# Patient Record
Sex: Female | Born: 1935 | Race: White | Hispanic: No | Smoking: Never smoker
Health system: Southern US, Community
[De-identification: ages and names within clinical notes are randomized; demographics above are authoritative.]

## PROBLEM LIST (undated history)

## (undated) DIAGNOSIS — T7840XA Allergy, unspecified, initial encounter: Secondary | ICD-10-CM

## (undated) DIAGNOSIS — I10 Essential (primary) hypertension: Secondary | ICD-10-CM

## (undated) DIAGNOSIS — Z9289 Personal history of other medical treatment: Secondary | ICD-10-CM

## (undated) DIAGNOSIS — K219 Gastro-esophageal reflux disease without esophagitis: Secondary | ICD-10-CM

## (undated) DIAGNOSIS — I480 Paroxysmal atrial fibrillation: Secondary | ICD-10-CM

## (undated) DIAGNOSIS — R55 Syncope and collapse: Secondary | ICD-10-CM

## (undated) DIAGNOSIS — R7301 Impaired fasting glucose: Secondary | ICD-10-CM

## (undated) DIAGNOSIS — H409 Unspecified glaucoma: Secondary | ICD-10-CM

## (undated) DIAGNOSIS — H269 Unspecified cataract: Secondary | ICD-10-CM

## (undated) DIAGNOSIS — C9110 Chronic lymphocytic leukemia of B-cell type not having achieved remission: Secondary | ICD-10-CM

## (undated) DIAGNOSIS — R001 Bradycardia, unspecified: Secondary | ICD-10-CM

## (undated) DIAGNOSIS — M810 Age-related osteoporosis without current pathological fracture: Secondary | ICD-10-CM

## (undated) DIAGNOSIS — I429 Cardiomyopathy, unspecified: Secondary | ICD-10-CM

## (undated) DIAGNOSIS — L900 Lichen sclerosus et atrophicus: Secondary | ICD-10-CM

## (undated) DIAGNOSIS — Z90710 Acquired absence of both cervix and uterus: Secondary | ICD-10-CM

## (undated) DIAGNOSIS — D649 Anemia, unspecified: Secondary | ICD-10-CM

## (undated) DIAGNOSIS — I5189 Other ill-defined heart diseases: Secondary | ICD-10-CM

## (undated) DIAGNOSIS — E785 Hyperlipidemia, unspecified: Secondary | ICD-10-CM

## (undated) DIAGNOSIS — E871 Hypo-osmolality and hyponatremia: Secondary | ICD-10-CM

## (undated) DIAGNOSIS — E039 Hypothyroidism, unspecified: Secondary | ICD-10-CM

## (undated) DIAGNOSIS — I779 Disorder of arteries and arterioles, unspecified: Secondary | ICD-10-CM

## (undated) HISTORY — DX: Cardiomyopathy, unspecified: I42.9

## (undated) HISTORY — DX: Hyperlipidemia, unspecified: E78.5

## (undated) HISTORY — DX: Lichen sclerosus et atrophicus: L90.0

## (undated) HISTORY — DX: Other ill-defined heart diseases: I51.89

## (undated) HISTORY — PX: APPENDECTOMY: SHX54

## (undated) HISTORY — DX: Unspecified cataract: H26.9

## (undated) HISTORY — DX: Essential (primary) hypertension: I10

## (undated) HISTORY — DX: Syncope and collapse: R55

## (undated) HISTORY — DX: Allergy, unspecified, initial encounter: T78.40XA

## (undated) HISTORY — DX: Anemia, unspecified: D64.9

## (undated) HISTORY — DX: Paroxysmal atrial fibrillation: I48.0

## (undated) HISTORY — DX: Gastro-esophageal reflux disease without esophagitis: K21.9

## (undated) HISTORY — DX: Disorder of arteries and arterioles, unspecified: I77.9

## (undated) HISTORY — DX: Impaired fasting glucose: R73.01

## (undated) HISTORY — DX: Personal history of other medical treatment: Z92.89

## (undated) HISTORY — PX: MULTIPLE TOOTH EXTRACTIONS: SHX2053

## (undated) HISTORY — DX: Unspecified glaucoma: H40.9

## (undated) HISTORY — DX: Hypothyroidism, unspecified: E03.9

## (undated) HISTORY — DX: Bradycardia, unspecified: R00.1

## (undated) HISTORY — PX: EYE SURGERY: SHX253

## (undated) HISTORY — DX: Age-related osteoporosis without current pathological fracture: M81.0

## (undated) HISTORY — PX: TOTAL ABDOMINAL HYSTERECTOMY: SHX209

## (undated) HISTORY — DX: Acquired absence of both cervix and uterus: Z90.710

---

## 2007-03-06 ENCOUNTER — Ambulatory Visit: Payer: Self-pay

## 2010-06-28 ENCOUNTER — Observation Stay: Payer: Self-pay | Admitting: Internal Medicine

## 2011-06-26 ENCOUNTER — Ambulatory Visit: Payer: Self-pay

## 2011-07-12 DIAGNOSIS — Z1231 Encounter for screening mammogram for malignant neoplasm of breast: Secondary | ICD-10-CM | POA: Diagnosis not present

## 2011-08-16 DIAGNOSIS — E785 Hyperlipidemia, unspecified: Secondary | ICD-10-CM | POA: Diagnosis not present

## 2011-08-16 DIAGNOSIS — I6529 Occlusion and stenosis of unspecified carotid artery: Secondary | ICD-10-CM | POA: Diagnosis not present

## 2011-08-16 DIAGNOSIS — I10 Essential (primary) hypertension: Secondary | ICD-10-CM | POA: Diagnosis not present

## 2011-08-16 DIAGNOSIS — E119 Type 2 diabetes mellitus without complications: Secondary | ICD-10-CM | POA: Diagnosis not present

## 2011-08-16 DIAGNOSIS — Z79899 Other long term (current) drug therapy: Secondary | ICD-10-CM | POA: Diagnosis not present

## 2011-08-16 DIAGNOSIS — H409 Unspecified glaucoma: Secondary | ICD-10-CM | POA: Diagnosis not present

## 2011-08-16 DIAGNOSIS — I658 Occlusion and stenosis of other precerebral arteries: Secondary | ICD-10-CM | POA: Diagnosis not present

## 2011-08-16 DIAGNOSIS — E039 Hypothyroidism, unspecified: Secondary | ICD-10-CM | POA: Diagnosis not present

## 2011-10-11 DIAGNOSIS — H4011X0 Primary open-angle glaucoma, stage unspecified: Secondary | ICD-10-CM | POA: Diagnosis not present

## 2011-11-27 DIAGNOSIS — I10 Essential (primary) hypertension: Secondary | ICD-10-CM | POA: Diagnosis not present

## 2011-11-27 DIAGNOSIS — E039 Hypothyroidism, unspecified: Secondary | ICD-10-CM | POA: Diagnosis not present

## 2011-11-27 DIAGNOSIS — M545 Low back pain: Secondary | ICD-10-CM | POA: Diagnosis not present

## 2011-11-27 DIAGNOSIS — E119 Type 2 diabetes mellitus without complications: Secondary | ICD-10-CM | POA: Diagnosis not present

## 2012-03-25 DIAGNOSIS — I10 Essential (primary) hypertension: Secondary | ICD-10-CM | POA: Diagnosis not present

## 2012-03-25 DIAGNOSIS — J019 Acute sinusitis, unspecified: Secondary | ICD-10-CM | POA: Diagnosis not present

## 2012-04-11 DIAGNOSIS — Z23 Encounter for immunization: Secondary | ICD-10-CM | POA: Diagnosis not present

## 2012-04-25 DIAGNOSIS — H4011X0 Primary open-angle glaucoma, stage unspecified: Secondary | ICD-10-CM | POA: Diagnosis not present

## 2012-06-03 DIAGNOSIS — I10 Essential (primary) hypertension: Secondary | ICD-10-CM | POA: Diagnosis not present

## 2012-06-03 DIAGNOSIS — E039 Hypothyroidism, unspecified: Secondary | ICD-10-CM | POA: Diagnosis not present

## 2012-06-03 DIAGNOSIS — Z0000 Encounter for general adult medical examination without abnormal findings: Secondary | ICD-10-CM | POA: Diagnosis not present

## 2012-06-03 DIAGNOSIS — E119 Type 2 diabetes mellitus without complications: Secondary | ICD-10-CM | POA: Diagnosis not present

## 2012-09-25 DIAGNOSIS — H4011X0 Primary open-angle glaucoma, stage unspecified: Secondary | ICD-10-CM | POA: Diagnosis not present

## 2012-12-11 DIAGNOSIS — E785 Hyperlipidemia, unspecified: Secondary | ICD-10-CM | POA: Diagnosis not present

## 2012-12-11 DIAGNOSIS — E039 Hypothyroidism, unspecified: Secondary | ICD-10-CM | POA: Diagnosis not present

## 2012-12-11 DIAGNOSIS — E119 Type 2 diabetes mellitus without complications: Secondary | ICD-10-CM | POA: Diagnosis not present

## 2013-04-03 DIAGNOSIS — H4010X0 Unspecified open-angle glaucoma, stage unspecified: Secondary | ICD-10-CM | POA: Diagnosis not present

## 2013-06-11 DIAGNOSIS — L280 Lichen simplex chronicus: Secondary | ICD-10-CM | POA: Diagnosis not present

## 2013-06-11 DIAGNOSIS — I10 Essential (primary) hypertension: Secondary | ICD-10-CM | POA: Diagnosis not present

## 2013-06-11 DIAGNOSIS — E7800 Pure hypercholesterolemia, unspecified: Secondary | ICD-10-CM | POA: Diagnosis not present

## 2013-06-11 DIAGNOSIS — R7301 Impaired fasting glucose: Secondary | ICD-10-CM | POA: Diagnosis not present

## 2013-06-11 DIAGNOSIS — Z1331 Encounter for screening for depression: Secondary | ICD-10-CM | POA: Diagnosis not present

## 2013-06-11 DIAGNOSIS — E039 Hypothyroidism, unspecified: Secondary | ICD-10-CM | POA: Diagnosis not present

## 2013-06-11 DIAGNOSIS — Z0000 Encounter for general adult medical examination without abnormal findings: Secondary | ICD-10-CM | POA: Diagnosis not present

## 2013-06-11 DIAGNOSIS — E785 Hyperlipidemia, unspecified: Secondary | ICD-10-CM | POA: Diagnosis not present

## 2013-06-11 DIAGNOSIS — E119 Type 2 diabetes mellitus without complications: Secondary | ICD-10-CM | POA: Diagnosis not present

## 2013-10-01 DIAGNOSIS — H4011X0 Primary open-angle glaucoma, stage unspecified: Secondary | ICD-10-CM | POA: Diagnosis not present

## 2013-12-10 DIAGNOSIS — E785 Hyperlipidemia, unspecified: Secondary | ICD-10-CM | POA: Diagnosis not present

## 2013-12-10 DIAGNOSIS — E119 Type 2 diabetes mellitus without complications: Secondary | ICD-10-CM | POA: Diagnosis not present

## 2013-12-10 DIAGNOSIS — I10 Essential (primary) hypertension: Secondary | ICD-10-CM | POA: Diagnosis not present

## 2014-03-10 DIAGNOSIS — Z23 Encounter for immunization: Secondary | ICD-10-CM | POA: Diagnosis not present

## 2014-04-02 DIAGNOSIS — H40013 Open angle with borderline findings, low risk, bilateral: Secondary | ICD-10-CM | POA: Diagnosis not present

## 2014-06-16 DIAGNOSIS — Z0000 Encounter for general adult medical examination without abnormal findings: Secondary | ICD-10-CM | POA: Diagnosis not present

## 2014-06-16 DIAGNOSIS — R350 Frequency of micturition: Secondary | ICD-10-CM | POA: Diagnosis not present

## 2014-06-16 DIAGNOSIS — R8299 Other abnormal findings in urine: Secondary | ICD-10-CM | POA: Diagnosis not present

## 2014-06-16 DIAGNOSIS — I10 Essential (primary) hypertension: Secondary | ICD-10-CM | POA: Diagnosis not present

## 2014-06-16 DIAGNOSIS — G0439 Other acute necrotizing hemorrhagic encephalopathy: Secondary | ICD-10-CM | POA: Diagnosis not present

## 2014-06-16 DIAGNOSIS — R7301 Impaired fasting glucose: Secondary | ICD-10-CM | POA: Diagnosis not present

## 2014-06-30 DIAGNOSIS — E2839 Other primary ovarian failure: Secondary | ICD-10-CM | POA: Diagnosis not present

## 2014-06-30 DIAGNOSIS — M818 Other osteoporosis without current pathological fracture: Secondary | ICD-10-CM | POA: Diagnosis not present

## 2014-10-14 DIAGNOSIS — H40013 Open angle with borderline findings, low risk, bilateral: Secondary | ICD-10-CM | POA: Diagnosis not present

## 2014-12-07 ENCOUNTER — Telehealth: Payer: Self-pay

## 2014-12-07 DIAGNOSIS — R05 Cough: Secondary | ICD-10-CM | POA: Insufficient documentation

## 2014-12-07 DIAGNOSIS — R059 Cough, unspecified: Secondary | ICD-10-CM | POA: Insufficient documentation

## 2014-12-07 DIAGNOSIS — J069 Acute upper respiratory infection, unspecified: Secondary | ICD-10-CM | POA: Diagnosis not present

## 2014-12-07 DIAGNOSIS — I700 Atherosclerosis of aorta: Secondary | ICD-10-CM | POA: Diagnosis not present

## 2014-12-07 NOTE — Telephone Encounter (Signed)
Since pt has a fever, she needs to be seen.  She can go to urgent care

## 2014-12-07 NOTE — Telephone Encounter (Signed)
Sydney Mcdaniel has crud, cough fever. They do not want her husband to get it since he's so sick. Daughter [name] wants to know if you can call in something for her or work her in somewhere? The first place I could squeeze her in would be Thursday morning.

## 2014-12-07 NOTE — Telephone Encounter (Signed)
Pt's daughter notified.

## 2014-12-07 NOTE — Telephone Encounter (Signed)
She is not my patient; please forward to appropriate provider

## 2014-12-22 ENCOUNTER — Encounter: Payer: Self-pay | Admitting: Unknown Physician Specialty

## 2014-12-22 ENCOUNTER — Ambulatory Visit (INDEPENDENT_AMBULATORY_CARE_PROVIDER_SITE_OTHER): Payer: Medicare Other | Admitting: Unknown Physician Specialty

## 2014-12-22 VITALS — BP 131/64 | HR 62 | Temp 98.6°F | Ht 61.8 in | Wt 144.0 lb

## 2014-12-22 DIAGNOSIS — E7800 Pure hypercholesterolemia, unspecified: Secondary | ICD-10-CM

## 2014-12-22 DIAGNOSIS — F4321 Adjustment disorder with depressed mood: Secondary | ICD-10-CM

## 2014-12-22 DIAGNOSIS — E039 Hypothyroidism, unspecified: Secondary | ICD-10-CM

## 2014-12-22 DIAGNOSIS — I10 Essential (primary) hypertension: Secondary | ICD-10-CM | POA: Insufficient documentation

## 2014-12-22 DIAGNOSIS — E785 Hyperlipidemia, unspecified: Secondary | ICD-10-CM | POA: Insufficient documentation

## 2014-12-22 MED ORDER — AMLODIPINE BESYLATE 5 MG PO TABS: 5.0000 mg | ORAL_TABLET | Freq: Every day | ORAL | Status: DC

## 2014-12-22 MED ORDER — LEVOTHYROXINE SODIUM 88 MCG PO TABS: 88.0000 ug | ORAL_TABLET | Freq: Every day | ORAL | Status: DC

## 2014-12-22 MED ORDER — LISINOPRIL 20 MG PO TABS: 20.0000 mg | ORAL_TABLET | Freq: Every day | ORAL | Status: DC

## 2014-12-22 MED ORDER — ATORVASTATIN CALCIUM 40 MG PO TABS: 40.0000 mg | ORAL_TABLET | Freq: Every day | ORAL | Status: DC

## 2014-12-22 MED ORDER — IPRATROPIUM BROMIDE 0.03 % NA SOLN: 2.0000 | Freq: Three times a day (TID) | NASAL | Status: DC

## 2014-12-22 NOTE — Progress Notes (Signed)
BP 131/64 mmHg  Pulse 62  Temp(Src) 98.6 F (37 C)  Ht 5' 1.8" (1.57 m)  Wt 144 lb (65.318 kg)  BMI 26.50 kg/m2  SpO2 96%  LMP  (LMP Unknown)   Subjective:    Patient ID: Sydney Mcdaniel, female    DOB: 09-Feb-1925, 81 y.o.   MRN: [id number]  HPI: Sydney Mcdaniel is a 81 y.o. female  Chief Complaint  Patient presents with  . Hyperlipidemia  . Hypertension  . Medication Refill    pt needs lisinopril refilled    Relevant past medical, surgical, family and social history reviewed and updated as indicated. Interim medical history since our last visit reviewed. Allergies and medications reviewed and updated.  Hyperlipidemia This is a chronic problem. The problem is controlled. Recent lipid tests were reviewed and are normal. Pertinent negatives include no chest pain or shortness of breath. There are no compliance problems.   Hypertension This is a chronic problem. The problem is controlled. Pertinent negatives include no anxiety, chest pain, malaise/fatigue or shortness of breath. There are no associated agents to hypertension. There are no compliance problems.      Review of Systems  Constitutional: Negative for malaise/fatigue.  Respiratory: Negative for shortness of breath.        Cough.  Treated at Urgent Care with a Z pack about 8 days ago.  Better but still has a cough    Cardiovascular: Negative for chest pain.  Psychiatric/Behavioral:       Depression as just lost husband to brain cancer    Per HPI unless specifically indicated above     Objective:    BP 131/64 mmHg  Pulse 62  Temp(Src) 98.6 F (37 C)  Ht 5' 1.8" (1.57 m)  Wt 144 lb (65.318 kg)  BMI 26.50 kg/m2  SpO2 96%  LMP  (LMP Unknown)  Wt Readings from Last 3 Encounters:  12/22/14 144 lb (65.318 kg)  06/16/14 144 lb (65.318 kg)    Physical Exam  Constitutional: She is oriented to person, place, and time. She appears well-developed and well-nourished. No distress.  HENT:  Head: Normocephalic  and atraumatic.  Eyes: Conjunctivae and lids are normal. Right eye exhibits no discharge. Left eye exhibits no discharge. No scleral icterus.  Cardiovascular: Normal rate and regular rhythm.   Pulmonary/Chest: Effort normal and breath sounds normal. No respiratory distress.  Abdominal: Normal appearance and bowel sounds are normal. She exhibits no distension. There is no splenomegaly or hepatomegaly. There is no tenderness.  Musculoskeletal: Normal range of motion.  Neurological: She is alert and oriented to person, place, and time.  Skin: Skin is intact. No rash noted. No pallor.  Psychiatric: She has a normal mood and affect. Her behavior is normal. Judgment and thought content normal.    No results found for this or any previous visit.    Assessment & Plan:   Problem List Items Addressed This Visit      Cardiovascular and Mediastinum   Essential hypertension, benign - Primary   Relevant Medications   amLODipine (NORVASC) 5 MG tablet   atorvastatin (LIPITOR) 40 MG tablet   lisinopril (PRINIVIL,ZESTRIL) 20 MG tablet     Endocrine   Hypothyroidism   Relevant Medications   levothyroxine (SYNTHROID, LEVOTHROID) 88 MCG tablet     Other   Hypercholesteremia   Relevant Medications   amLODipine (NORVASC) 5 MG tablet   atorvastatin (LIPITOR) 40 MG tablet   lisinopril (PRINIVIL,ZESTRIL) 20 MG tablet    Other Visit  Diagnoses    Grief reaction        Daughter and she feels they don't need additional help.  She has lots of family support        Follow up plan: Return in about 6 months (around 06/23/2015) for PE.  No labs today.  Needs CMP, hgb A1C, TSH and Lipid panel next visit Filled out a handicapped form.

## 2015-04-13 ENCOUNTER — Ambulatory Visit: Payer: Medicare Other

## 2015-04-13 DIAGNOSIS — H40019 Open angle with borderline findings, low risk, unspecified eye: Secondary | ICD-10-CM | POA: Diagnosis not present

## 2015-04-13 DIAGNOSIS — Z23 Encounter for immunization: Secondary | ICD-10-CM

## 2015-06-22 ENCOUNTER — Encounter: Payer: Self-pay | Admitting: Family Medicine

## 2015-06-22 ENCOUNTER — Ambulatory Visit (INDEPENDENT_AMBULATORY_CARE_PROVIDER_SITE_OTHER): Payer: Medicare Other | Admitting: Family Medicine

## 2015-06-22 ENCOUNTER — Telehealth: Payer: Self-pay | Admitting: Family Medicine

## 2015-06-22 VITALS — BP 144/62 | HR 64 | Temp 98.7°F | Ht 61.25 in | Wt 138.0 lb

## 2015-06-22 DIAGNOSIS — E7800 Pure hypercholesterolemia, unspecified: Secondary | ICD-10-CM | POA: Diagnosis not present

## 2015-06-22 DIAGNOSIS — R7301 Impaired fasting glucose: Secondary | ICD-10-CM

## 2015-06-22 DIAGNOSIS — I10 Essential (primary) hypertension: Secondary | ICD-10-CM | POA: Diagnosis not present

## 2015-06-22 DIAGNOSIS — Z862 Personal history of diseases of the blood and blood-forming organs and certain disorders involving the immune mechanism: Secondary | ICD-10-CM | POA: Diagnosis not present

## 2015-06-22 DIAGNOSIS — I6529 Occlusion and stenosis of unspecified carotid artery: Secondary | ICD-10-CM | POA: Insufficient documentation

## 2015-06-22 DIAGNOSIS — Z0000 Encounter for general adult medical examination without abnormal findings: Secondary | ICD-10-CM | POA: Diagnosis not present

## 2015-06-22 DIAGNOSIS — E039 Hypothyroidism, unspecified: Secondary | ICD-10-CM

## 2015-06-22 DIAGNOSIS — Z5181 Encounter for therapeutic drug level monitoring: Secondary | ICD-10-CM

## 2015-06-22 DIAGNOSIS — R351 Nocturia: Secondary | ICD-10-CM | POA: Diagnosis not present

## 2015-06-22 DIAGNOSIS — I6523 Occlusion and stenosis of bilateral carotid arteries: Secondary | ICD-10-CM | POA: Diagnosis not present

## 2015-06-22 LAB — MICROSCOPIC EXAMINATION

## 2015-06-22 LAB — MICROALBUMIN, URINE WAIVED
CREATININE, URINE WAIVED: 50 mg/dL (ref 10–300)
MICROALB, UR WAIVED: 30 mg/L — AB (ref 0–19)

## 2015-06-22 MED ORDER — DOXYCYCLINE HYCLATE 100 MG PO TABS: 100.0000 mg | ORAL_TABLET | Freq: Two times a day (BID) | ORAL | Status: DC

## 2015-06-22 MED ORDER — ATORVASTATIN CALCIUM 20 MG PO TABS: 20.0000 mg | ORAL_TABLET | Freq: Every day | ORAL | Status: DC

## 2015-06-22 MED ORDER — AMLODIPINE BESYLATE 5 MG PO TABS: 5.0000 mg | ORAL_TABLET | Freq: Every day | ORAL | Status: DC

## 2015-06-22 MED ORDER — LEVOTHYROXINE SODIUM 88 MCG PO TABS: 88.0000 ug | ORAL_TABLET | Freq: Every day | ORAL | Status: DC

## 2015-06-22 MED ORDER — LISINOPRIL 20 MG PO TABS: 20.0000 mg | ORAL_TABLET | Freq: Every day | ORAL | Status: DC

## 2015-06-22 MED ORDER — IPRATROPIUM BROMIDE 0.03 % NA SOLN: 2.0000 | Freq: Three times a day (TID) | NASAL | Status: DC

## 2015-06-22 NOTE — Patient Instructions (Addendum)
Your goal blood pressure is less than 150 mmHg on top. Try to follow the DASH guidelines (DASH stands for Dietary Approaches to Stop Hypertension) Try to limit the sodium in your diet.  Ideally, consume less than 1.5 grams (less than 1,500mg ) per day. Do not add salt when cooking or at the table.  Check the sodium amount on labels when shopping, and choose items lower in sodium when given a choice. Avoid or limit foods that already contain a lot of sodium. Eat a diet rich in fruits and vegetables and whole grains. Monitor your blood pressure at home daily for the next two weeks, and let me know if top number not staying under 150 Try to practice good fall precautions (don't get up on chairs, ladders, hold hand railings if going up or down stairs, etc.)  Okay to stop vitamin E Stop the iron for now  Return in 6 months for cholesterol and prediabetes Return in 12+ months for next Medicare visit

## 2015-06-22 NOTE — Progress Notes (Signed)
Patient: Sydney Mcdaniel, Female    DOB: 04/14/1925, 81 y.o.   MRN: [id number]  Today's Provider: Enid Derry, MD   Chief Complaint  Patient presents with  . Annual Exam    Wellness exam   Subjective:   Sydney Mcdaniel is a 81 y.o. female who presents today for her Subsequent Annual Wellness Visit.  Caregiver input:  Daughter is here  USPSTF grade A and B recommendations Alcohol: nondrinker Depression:  Depression screen [facility] 2/9 06/22/2015 12/22/2014  Decreased Interest 0 3  Down, Depressed, Hopeless 1 3  PHQ - 2 Score 1 6  Altered sleeping - 3  Tired, decreased energy - 2  Change in appetite - 1  Feeling bad or failure about yourself  - 0  Trouble concentrating - 3  Moving slowly or fidgety/restless - 0  Suicidal thoughts - 0  PHQ-9 Score - 15   Hypertension: uncontrolled today; no recent echo; no murmur to her knowledge Obesity: barely above normal BMI, for age, this is acceptable Tobacco use: nonsmoker HIV, hep B, hep C: politely declined STD testing and prevention (chl/gon/syphilis): politely declined Lipids: check today Glucose: check today Colorectal cancer: aged out Breast cancer: has not had one since 2013 Cervical cancer screening: n/a Lung cancer: n/a Osteoporosis: UTD Fall prevention/vitamin D:  AAA: n/a Aspirin: discussed no good study, but no bleeding problems Diet: better than typical American, not much cheese or red meat or eggs Exercise: active and walks, goes to the mailbox, gets out in the yard Skin cancer: no worrisome moles  Long time in between urination; gets up 1-3x a night; not much urine flow in the daytime; not much a water drinker though; used to get dehydrated Dr. Marijean Bravo put her back on the aspirin when they were examining her carotids; she isn't taking it every day; no problems with the aspirin, no bleeding  HPI  Review of Systems  Past Medical History  Diagnosis Date  . Glaucoma   . H/O: hysterectomy     Total  . Lichen  sclerosus   . Hypothyroidism   . Allergy   . Impaired fasting glucose   . Osteoporosis     Hips  . Hyperlipidemia   . Hypertension    Past Surgical History  Procedure Laterality Date  . Total abdominal hysterectomy    . Eye surgery      Glaucoma   Family History  Problem Relation Age of Onset  . Heart attack Mother   . Glaucoma Mother   . Heart attack Father   . Parkinson's disease Brother   . Heart attack Brother    Social History   Social History  . Marital Status: Married    Spouse Name: N/A  . Number of Children: N/A  . Years of Education: N/A   Occupational History  . Not on file.   Social History Main Topics  . Smoking status: Never Smoker   . Smokeless tobacco: Never Used  . Alcohol Use: No  . Drug Use: No  . Sexual Activity: No   Other Topics Concern  . Not on file   Social History Narrative   Outpatient Encounter Prescriptions as of 06/22/2015  Medication Sig Note  . amLODipine (NORVASC) 5 MG tablet Take 1 tablet (5 mg total) by mouth daily.   Marland Kitchen aspirin 81 MG tablet Take 81 mg by mouth daily.   . Calcium Carb-Cholecalciferol (CALCIUM 600 + D PO) Take by mouth daily.   Marland Kitchen ipratropium (ATROVENT) 0.03 % nasal spray  Place 2 sprays into both nostrils 3 (three) times daily.   Marland Kitchen latanoprost (XALATAN) 0.005 % ophthalmic solution  06/22/2015: Received from: External Pharmacy  . levothyroxine (SYNTHROID, LEVOTHROID) 88 MCG tablet Take 1 tablet (88 mcg total) by mouth daily before breakfast.   . lisinopril (PRINIVIL,ZESTRIL) 20 MG tablet Take 1 tablet (20 mg total) by mouth daily.   . Omega-3 Fatty Acids (FISH OIL) 1200 MG CAPS Take by mouth daily.   . timolol (TIMOPTIC) 0.5 % ophthalmic solution  06/22/2015: Received from: External Pharmacy  . [DISCONTINUED] amLODipine (NORVASC) 5 MG tablet Take 1 tablet (5 mg total) by mouth daily.   . [DISCONTINUED] atorvastatin (LIPITOR) 40 MG tablet Take 1 tablet (40 mg total) by mouth at bedtime.   . [DISCONTINUED]  ferrous sulfate 325 (65 FE) MG EC tablet Take 325 mg by mouth daily.   . [DISCONTINUED] ipratropium (ATROVENT) 0.03 % nasal spray Place 2 sprays into both nostrils 3 (three) times daily.   . [DISCONTINUED] levothyroxine (SYNTHROID, LEVOTHROID) 88 MCG tablet Take 1 tablet (88 mcg total) by mouth daily before breakfast.   . [DISCONTINUED] lisinopril (PRINIVIL,ZESTRIL) 20 MG tablet Take 1 tablet (20 mg total) by mouth daily.   . [DISCONTINUED] vitamin E 400 UNIT capsule Take 400 Units by mouth daily.   Marland Kitchen atorvastatin (LIPITOR) 20 MG tablet Take 1 tablet (20 mg total) by mouth at bedtime.   . [DISCONTINUED] atorvastatin (LIPITOR) 20 MG tablet Take 1 tablet (20 mg total) by mouth at bedtime.    No facility-administered encounter medications on file as of 06/22/2015.   Functional Ability / Safety Screening 1.  Was the timed Get Up and Go test longer than 30 seconds?  no 2.  Does the patient need help with the phone, transportation, shopping,      preparing meals, housework, laundry, medications, or managing money?  yes 3.  Does the patient's home have:  loose throw rugs in the hallway?   no      Grab bars in the bathroom? yes      Handrails on the stairs?   yes      Poor lighting?   no 4.  Has the patient noticed any hearing difficulties?   no, not much  Fall Risk Assessment See under rooming  Depression Screen See under rooming Depression screen [facility] 2/9 06/22/2015 12/22/2014  Decreased Interest 0 3  Down, Depressed, Hopeless 1 3  PHQ - 2 Score 1 6  Altered sleeping - 3  Tired, decreased energy - 2  Change in appetite - 1  Feeling bad or failure about yourself  - 0  Trouble concentrating - 3  Moving slowly or fidgety/restless - 0  Suicidal thoughts - 0  PHQ-9 Score - 15   Advanced Directives Does patient have a HCPOA?    yes If yes, name and contact information: [name], [phone] Does patient have a living will or MOST form?  no  Objective:   Vitals: BP 144/62 mmHg  Pulse 64   Temp(Src) 98.7 F (37.1 C)  Ht 5' 1.25" (1.556 m)  Wt 138 lb (62.596 kg)  BMI 25.85 kg/m2  SpO2 99%  LMP  (LMP Unknown) Body mass index is 25.85 kg/(m^2).  Visual Acuity Screening   Right eye Left eye Both eyes  Without correction:     With correction: 20/30 20/40    Physical Exam  Constitutional: She appears well-developed and well-nourished. No distress.  Cardiovascular: Normal rate and regular rhythm.   Pulmonary/Chest: Effort normal and breath  sounds normal.  Neurological: She displays no tremor. Gait normal.  Skin: No pallor.    Today's Vitals   06/22/15 0857 06/22/15 0918 06/22/15 0919 06/22/15 0943  BP: 195/65 172/64 164/64 144/62  Pulse: 64     Temp: 98.7 F (37.1 C)     Height: 5' 1.25" (1.556 m)     Weight: 138 lb (62.596 kg)     SpO2: 99%      Mood/affect:  Euthymic, but then briefly tearful when discussing this being first Christmas since her husband passed Appearance:  Casually dressed  Cognitive Testing - 6-CIT  Correct? Score   What year is it? yes 0 Yes = 0    No = 4  What month is it? yes 0 Yes = 0    No = 3  Remember:     Pia [address]au, [place], [place]     What time is it? yes 0 Yes = 0    No = 3  Count backwards from 20 to 1 yes 0 Correct = 0    1 error = 2   More than 1 error = 4  Say the months of the year in reverse. yes 0 Correct = 0    1 error = 2   More than 1 error = 4  What address did I ask you to remember? no 1 Correct = 0  1 error = 2    2 error = 4    3 error = 6    4 error = 8    All wrong = 10       TOTAL SCORE  2/28   Interpretation:  Normal  Normal (0-7) Abnormal (8-28)    Assessment & Plan:     Annual Wellness Visit  Reviewed patient's Family Medical History Reviewed and updated list of patient's medical providers Assessment of cognitive impairment was done Assessed patient's functional ability Established a written schedule for health screening services Health Maintenance  Topic Date Due  . TETANUS/TDAP   06/21/2016 (Originally 01/21/2014)  . INFLUENZA VACCINE  01/25/2016  . DEXA SCAN  Addressed  . ZOSTAVAX  Completed  . PNA vac Low Risk Adult  Completed   Health Risk Assessent Completed and Reviewed  Immunization History  Administered Date(s) Administered  . Influenza,inj,Quad PF,36+ Mos 04/13/2015  . Influenza-Unspecified 03/18/2014  . Pneumococcal Conjugate-13 12/10/2013  . Pneumococcal Polysaccharide-23 04/13/2008  . Td 01/22/2004  . Zoster 04/10/2006    Health Maintenance  Topic Date Due  . TETANUS/TDAP  06/21/2016 (Originally 01/21/2014)  . INFLUENZA VACCINE  01/25/2016  . DEXA SCAN  Addressed  . ZOSTAVAX  Completed  . PNA vac Low Risk Adult  Completed   Discussed health benefits of physical activity, and encouraged her to engage in regular exercise appropriate for her age and condition.   Meds ordered this encounter  Medications  . latanoprost (XALATAN) 0.005 % ophthalmic solution    Sig:   . timolol (TIMOPTIC) 0.5 % ophthalmic solution    Sig:   . DISCONTD: vitamin E 400 UNIT capsule    Sig: Take 400 Units by mouth daily.  . Calcium Carb-Cholecalciferol (CALCIUM 600 + D PO)    Sig: Take by mouth daily.  Marland Kitchen DISCONTD: ferrous sulfate 325 (65 FE) MG EC tablet    Sig: Take 325 mg by mouth daily.  . Omega-3 Fatty Acids (FISH OIL) 1200 MG CAPS    Sig: Take by mouth daily.  Marland Kitchen DISCONTD: atorvastatin (LIPITOR) 20 MG  tablet    Sig: Take 1 tablet (20 mg total) by mouth at bedtime.    Dispense:  90 tablet    Refill:  1  . lisinopril (PRINIVIL,ZESTRIL) 20 MG tablet    Sig: Take 1 tablet (20 mg total) by mouth daily.    Dispense:  90 tablet    Refill:  1  . atorvastatin (LIPITOR) 20 MG tablet    Sig: Take 1 tablet (20 mg total) by mouth at bedtime.    Dispense:  90 tablet    Refill:  1  . amLODipine (NORVASC) 5 MG tablet    Sig: Take 1 tablet (5 mg total) by mouth daily.    Dispense:  90 tablet    Refill:  1  . ipratropium (ATROVENT) 0.03 % nasal spray    Sig: Place 2  sprays into both nostrils 3 (three) times daily.    Dispense:  3 mL    Refill:  3  . levothyroxine (SYNTHROID, LEVOTHROID) 88 MCG tablet    Sig: Take 1 tablet (88 mcg total) by mouth daily before breakfast.    Dispense:  90 tablet    Refill:  1    Current outpatient prescriptions:  .  amLODipine (NORVASC) 5 MG tablet, Take 1 tablet (5 mg total) by mouth daily., Disp: 90 tablet, Rfl: 1 .  aspirin 81 MG tablet, Take 81 mg by mouth daily., Disp: , Rfl:  .  Calcium Carb-Cholecalciferol (CALCIUM 600 + D PO), Take by mouth daily., Disp: , Rfl:  .  ipratropium (ATROVENT) 0.03 % nasal spray, Place 2 sprays into both nostrils 3 (three) times daily., Disp: 3 mL, Rfl: 3 .  latanoprost (XALATAN) 0.005 % ophthalmic solution, , Disp: , Rfl:  .  levothyroxine (SYNTHROID, LEVOTHROID) 88 MCG tablet, Take 1 tablet (88 mcg total) by mouth daily before breakfast., Disp: 90 tablet, Rfl: 1 .  lisinopril (PRINIVIL,ZESTRIL) 20 MG tablet, Take 1 tablet (20 mg total) by mouth daily., Disp: 90 tablet, Rfl: 1 .  Omega-3 Fatty Acids (FISH OIL) 1200 MG CAPS, Take by mouth daily., Disp: , Rfl:  .  timolol (TIMOPTIC) 0.5 % ophthalmic solution, , Disp: , Rfl:  .  atorvastatin (LIPITOR) 20 MG tablet, Take 1 tablet (20 mg total) by mouth at bedtime., Disp: 90 tablet, Rfl: 1 .  doxycycline (VIBRA-TABS) 100 MG tablet, Take 1 tablet (100 mg total) by mouth 2 (two) times daily., Disp: 14 tablet, Rfl: 0 Medications Discontinued During This Encounter  Medication Reason  . atorvastatin (LIPITOR) 40 MG tablet Dose change  . ferrous sulfate 325 (65 FE) MG EC tablet Discontinued by provider  . vitamin E 400 UNIT capsule Discontinued by provider  . lisinopril (PRINIVIL,ZESTRIL) 20 MG tablet Reorder  . atorvastatin (LIPITOR) 20 MG tablet Reorder  . amLODipine (NORVASC) 5 MG tablet Reorder  . ipratropium (ATROVENT) 0.03 % nasal spray Reorder  . levothyroxine (SYNTHROID, LEVOTHROID) 88 MCG tablet Reorder    Next Medicare Wellness  Visit in 12+ months  Problem List Items Addressed This Visit      Cardiovascular and Mediastinum   Essential hypertension, benign    High at check-in, triage; manual BPs done both arms; DASH guidelines encouraged      Relevant Medications   lisinopril (PRINIVIL,ZESTRIL) 20 MG tablet   atorvastatin (LIPITOR) 20 MG tablet   amLODipine (NORVASC) 5 MG tablet   Other Relevant Orders   Microalbumin, Urine Waived   Carotid atherosclerosis    Used to see Dr. Marcy Salvo, vascular specialist;  it sounds like she is overdue for carotid scan; ordered today      Relevant Medications   lisinopril (PRINIVIL,ZESTRIL) 20 MG tablet   atorvastatin (LIPITOR) 20 MG tablet   amLODipine (NORVASC) 5 MG tablet   Other Relevant Orders   US Carotid Duplex Bilateral     Endocrine   Hypothyroidism    Check TSH today and adjust dose if needed      Relevant Medications   levothyroxine (SYNTHROID, LEVOTHROID) 88 MCG tablet   Other Relevant Orders   TSH (Completed)   Impaired fasting glucose    Check A1c today as well as fasting glucose      Relevant Orders   Hgb A1c w/o eAG (Completed)     Other   Hypercholesteremia    Check lipids today (fasting); taking medicine for this; would like refill      Relevant Medications   lisinopril (PRINIVIL,ZESTRIL) 20 MG tablet   atorvastatin (LIPITOR) 20 MG tablet   amLODipine (NORVASC) 5 MG tablet   Other Relevant Orders   Lipid Panel w/o Chol/HDL Ratio   Preventative health care - Primary    Age-appropriate recommendations and screening per USPSTF and Medicare guidelines      Medication monitoring encounter   Relevant Orders   Comprehensive metabolic panel (Completed)   Nocturia    Check urine today      Relevant Orders   UA/M w/rflx Culture, Routine (Completed)    Other Visit Diagnoses    Hx of iron deficiency anemia        Relevant Orders    CBC with Differential/Platelet (Completed)    Ferritin (Completed)

## 2015-06-22 NOTE — Assessment & Plan Note (Signed)
High at check-in, triage; manual BPs done both arms; DASH guidelines encouraged

## 2015-06-22 NOTE — Progress Notes (Signed)
  BP 195/65 mmHg  Pulse 64  Temp(Src) 98.7 F (37.1 C)  Ht 5' 1.25" (1.556 m)  Wt 138 lb (62.596 kg)  BMI 25.85 kg/m2  SpO2 99%  LMP  (LMP Unknown)   Subjective:    Patient ID: Sydney Mcdaniel, female    DOB: 27-Jan-1925, 81 y.o.   MRN: [id number]  HPI: Sydney Mcdaniel is a 81 y.o. female  Chief Complaint  Patient presents with  . Annual Exam    Wellness exam   USPSTF grade A and B recommendations Alcohol:  Depression:  Depression screen [facility] 2/9 06/22/2015 12/22/2014  Decreased Interest 0 3  Down, Depressed, Hopeless 1 3  PHQ - 2 Score 1 6  Altered sleeping - 3  Tired, decreased energy - 2  Change in appetite - 1  Feeling bad or failure about yourself  - 0  Trouble concentrating - 3  Moving slowly or fidgety/restless - 0  Suicidal thoughts - 0  PHQ-9 Score - 15   Hypertension: not controlled today; see AVS Obesity: barely overweight; reasonable weight for age Tobacco use:  HIV, hep B, hep C:  STD testing and prevention (chl/gon/syphilis):  Lipids:  Glucose:  Colorectal cancer:  Breast cancer:  BRCA gene screening: Intimate partner violence: Cervical cancer screening:  Lung cancer:  Osteoporosis:  Fall prevention/vitamin D:  AAA:  Aspirin:  Diet:  Exercise:  Skin cancer:    Relevant past medical, surgical, family and social history reviewed and updated as indicated. Interim medical history since our last visit reviewed. Allergies and medications reviewed and updated.  Review of Systems  Per HPI unless specifically indicated above     Objective:    BP 195/65 mmHg  Pulse 64  Temp(Src) 98.7 F (37.1 C)  Ht 5' 1.25" (1.556 m)  Wt 138 lb (62.596 kg)  BMI 25.85 kg/m2  SpO2 99%  LMP  (LMP Unknown)  Wt Readings from Last 3 Encounters:  06/22/15 138 lb (62.596 kg)  12/22/14 144 lb (65.318 kg)  06/16/14 144 lb (65.318 kg)    Physical Exam  No results found for this or any previous visit.    Assessment & Plan:   Problem List Items Addressed  This Visit    None       Follow up plan: No Follow-up on file.

## 2015-06-22 NOTE — Assessment & Plan Note (Signed)
Check TSH today and adjust dose if needed 

## 2015-06-22 NOTE — Assessment & Plan Note (Signed)
Check A1c today as well as fasting glucose

## 2015-06-22 NOTE — Telephone Encounter (Signed)
Urine actually shows infection; I tried calling patient, left msg; I called daughter, left detailed msg on her phone that antibiotic sent in, please start, hydration encouraged, call with any problems

## 2015-06-22 NOTE — Assessment & Plan Note (Signed)
Check lipids today (fasting); taking medicine for this; would like refill

## 2015-06-23 LAB — LIPID PANEL W/O CHOL/HDL RATIO
Cholesterol, Total: 180 mg/dL (ref 100–199)
HDL: 54 mg/dL (ref >39)
LDL CALC: 93 mg/dL (ref 0–99)
Triglycerides: 167 mg/dL — ABNORMAL HIGH (ref 0–149)
VLDL Cholesterol Cal: 33 mg/dL (ref 5–40)

## 2015-06-23 LAB — CBC WITH DIFFERENTIAL/PLATELET
BASOS: 0 %
Basophils Absolute: 0 10*3/uL (ref 0.0–0.2)
EOS (ABSOLUTE): 0.2 10*3/uL (ref 0.0–0.4)
Eos: 2 %
HEMOGLOBIN: 11.6 g/dL (ref 11.1–15.9)
Hematocrit: 35.3 % (ref 34.0–46.6)
IMMATURE GRANS (ABS): 0 10*3/uL (ref 0.0–0.1)
Immature Granulocytes: 0 %
LYMPHS: 59 %
Lymphocytes Absolute: 4.7 10*3/uL — ABNORMAL HIGH (ref 0.7–3.1)
MCH: 30.3 pg (ref 26.6–33.0)
MCHC: 32.9 g/dL (ref 31.5–35.7)
MCV: 92 fL (ref 79–97)
MONOCYTES: 6 %
Monocytes Absolute: 0.5 10*3/uL (ref 0.1–0.9)
Neutrophils Absolute: 2.6 10*3/uL (ref 1.4–7.0)
Neutrophils: 33 %
Platelets: 202 10*3/uL (ref 150–379)
RBC: 3.83 x10E6/uL (ref 3.77–5.28)
RDW: 14.5 % (ref 12.3–15.4)
WBC: 7.9 10*3/uL (ref 3.4–10.8)

## 2015-06-23 LAB — COMPREHENSIVE METABOLIC PANEL
ALBUMIN: 3.9 g/dL (ref 3.5–4.7)
ALT: 14 IU/L (ref 0–32)
AST: 17 IU/L (ref 0–40)
Albumin/Globulin Ratio: 1.6 (ref 1.1–2.5)
Alkaline Phosphatase: 29 IU/L — ABNORMAL LOW (ref 39–117)
BUN / CREAT RATIO: 17 (ref 11–26)
BUN: 13 mg/dL (ref 8–27)
Bilirubin Total: 0.4 mg/dL (ref 0.0–1.2)
CALCIUM: 9.3 mg/dL (ref 8.7–10.3)
CO2: 23 mmol/L (ref 18–29)
CREATININE: 0.75 mg/dL (ref 0.57–1.00)
Chloride: 104 mmol/L (ref 96–106)
GFR calc Af Amer: 82 mL/min/{1.73_m2} (ref >59)
GFR, EST NON AFRICAN AMERICAN: 71 mL/min/{1.73_m2} (ref >59)
GLOBULIN, TOTAL: 2.4 g/dL (ref 1.5–4.5)
GLUCOSE: 107 mg/dL — AB (ref 65–99)
Potassium: 5 mmol/L (ref 3.5–5.2)
SODIUM: 141 mmol/L (ref 134–144)
Total Protein: 6.3 g/dL (ref 6.0–8.5)

## 2015-06-23 LAB — TSH: TSH: 3.72 u[IU]/mL (ref 0.450–4.500)

## 2015-06-23 LAB — UA/M W/RFLX CULTURE, ROUTINE: Organism ID, Bacteria: NO GROWTH

## 2015-06-23 LAB — FERRITIN: FERRITIN: 143 ng/mL (ref 15–150)

## 2015-06-23 LAB — HGB A1C W/O EAG: Hgb A1c MFr Bld: 6.1 % — ABNORMAL HIGH (ref 4.8–5.6)

## 2015-06-25 ENCOUNTER — Other Ambulatory Visit: Payer: Self-pay | Admitting: Family Medicine

## 2015-06-25 DIAGNOSIS — D72820 Lymphocytosis (symptomatic): Secondary | ICD-10-CM

## 2015-06-26 NOTE — Assessment & Plan Note (Signed)
Age-appropriate recommendations and screening per USPSTF and Medicare guidelines

## 2015-06-26 NOTE — Assessment & Plan Note (Signed)
Check urine today 

## 2015-06-26 NOTE — Assessment & Plan Note (Signed)
Used to see Dr. Marcy Salvo, vascular specialist; it sounds like she is overdue for carotid scan; ordered today

## 2015-07-01 ENCOUNTER — Ambulatory Visit
Admission: RE | Admit: 2015-07-01 | Discharge: 2015-07-01 | Disposition: A | Discharge status: Home / Self Care | Payer: Medicare Other | Source: Ambulatory Visit | Attending: Family Medicine | Admitting: Family Medicine

## 2015-07-01 DIAGNOSIS — I6523 Occlusion and stenosis of bilateral carotid arteries: Secondary | ICD-10-CM | POA: Insufficient documentation

## 2015-07-09 ENCOUNTER — Telehealth: Payer: Self-pay | Admitting: Family Medicine

## 2015-07-09 DIAGNOSIS — I6523 Occlusion and stenosis of bilateral carotid arteries: Secondary | ICD-10-CM

## 2015-07-09 DIAGNOSIS — Z5181 Encounter for therapeutic drug level monitoring: Secondary | ICD-10-CM

## 2015-07-09 MED ORDER — ATORVASTATIN CALCIUM 40 MG PO TABS: 40.0000 mg | ORAL_TABLET | Freq: Every day | ORAL | Status: DC

## 2015-07-09 NOTE — Assessment & Plan Note (Signed)
Check SGPT in late Feb

## 2015-07-09 NOTE — Telephone Encounter (Signed)
I talked with patient about carotid results Increase atorvastatin from 20 mg to 40 mg daily; want to push LDL under 70 Refer to vascular for ongoing management, prevention, want to prevent her from needing surgery down the road She'll come back in late Feb for CBC recheck I talked with daughter too Work on healthy eating options Recheck lipids and SGPT with CBC

## 2015-07-26 DIAGNOSIS — E119 Type 2 diabetes mellitus without complications: Secondary | ICD-10-CM | POA: Diagnosis not present

## 2015-07-26 DIAGNOSIS — I6523 Occlusion and stenosis of bilateral carotid arteries: Secondary | ICD-10-CM | POA: Diagnosis not present

## 2015-07-26 DIAGNOSIS — E785 Hyperlipidemia, unspecified: Secondary | ICD-10-CM | POA: Diagnosis not present

## 2015-08-24 ENCOUNTER — Other Ambulatory Visit: Payer: Medicare Other

## 2015-08-24 DIAGNOSIS — Z5181 Encounter for therapeutic drug level monitoring: Secondary | ICD-10-CM

## 2015-08-24 DIAGNOSIS — I6523 Occlusion and stenosis of bilateral carotid arteries: Secondary | ICD-10-CM | POA: Diagnosis not present

## 2015-08-24 DIAGNOSIS — D72820 Lymphocytosis (symptomatic): Secondary | ICD-10-CM

## 2015-08-25 LAB — CBC WITH DIFFERENTIAL/PLATELET
BASOS ABS: 0 10*3/uL (ref 0.0–0.2)
Basos: 0 %
EOS (ABSOLUTE): 0.2 10*3/uL (ref 0.0–0.4)
Eos: 2 %
Hematocrit: 32.6 % — ABNORMAL LOW (ref 34.0–46.6)
Hemoglobin: 11.1 g/dL (ref 11.1–15.9)
Immature Grans (Abs): 0 10*3/uL (ref 0.0–0.1)
Immature Granulocytes: 0 %
LYMPHS ABS: 4.9 10*3/uL — AB (ref 0.7–3.1)
Lymphs: 56 %
MCH: 30.7 pg (ref 26.6–33.0)
MCHC: 34 g/dL (ref 31.5–35.7)
MCV: 90 fL (ref 79–97)
Monocytes Absolute: 0.5 10*3/uL (ref 0.1–0.9)
Monocytes: 6 %
NEUTROS ABS: 3.2 10*3/uL (ref 1.4–7.0)
Neutrophils: 36 %
PLATELETS: 191 10*3/uL (ref 150–379)
RBC: 3.62 x10E6/uL — ABNORMAL LOW (ref 3.77–5.28)
RDW: 14.5 % (ref 12.3–15.4)
WBC: 8.8 10*3/uL (ref 3.4–10.8)

## 2015-08-25 LAB — LIPID PANEL W/O CHOL/HDL RATIO
Cholesterol, Total: 141 mg/dL (ref 100–199)
HDL: 52 mg/dL (ref >39)
LDL CALC: 64 mg/dL (ref 0–99)
Triglycerides: 124 mg/dL (ref 0–149)
VLDL Cholesterol Cal: 25 mg/dL (ref 5–40)

## 2015-08-25 LAB — ALT: ALT: 17 IU/L (ref 0–32)

## 2015-08-27 ENCOUNTER — Telehealth: Payer: Self-pay | Admitting: Family Medicine

## 2015-08-27 DIAGNOSIS — D72820 Lymphocytosis (symptomatic): Secondary | ICD-10-CM

## 2015-08-27 NOTE — Telephone Encounter (Signed)
I talked with [name] (daughter); explained LDL much better, now under 51; ALT fine Explained lymphocytes are creeping up; not leukemia, but something we need to keep a close eye on Move June appt up to late May and check CBC and I'll get a CLL panel then too; will refer to heme-onc if/when lymph count >=5 Daughter agrees

## 2015-10-14 DIAGNOSIS — H401131 Primary open-angle glaucoma, bilateral, mild stage: Secondary | ICD-10-CM | POA: Diagnosis not present

## 2015-10-25 DIAGNOSIS — I6523 Occlusion and stenosis of bilateral carotid arteries: Secondary | ICD-10-CM | POA: Diagnosis not present

## 2015-10-25 DIAGNOSIS — E119 Type 2 diabetes mellitus without complications: Secondary | ICD-10-CM | POA: Diagnosis not present

## 2015-10-25 DIAGNOSIS — E785 Hyperlipidemia, unspecified: Secondary | ICD-10-CM | POA: Diagnosis not present

## 2015-11-16 ENCOUNTER — Encounter: Payer: Self-pay | Admitting: Family Medicine

## 2015-12-21 ENCOUNTER — Ambulatory Visit (INDEPENDENT_AMBULATORY_CARE_PROVIDER_SITE_OTHER): Payer: Medicare Other | Admitting: Family Medicine

## 2015-12-21 ENCOUNTER — Encounter: Payer: Self-pay | Admitting: Family Medicine

## 2015-12-21 VITALS — BP 140/58 | HR 47 | Temp 98.7°F | Resp 16 | Wt 132.0 lb

## 2015-12-21 DIAGNOSIS — D72820 Lymphocytosis (symptomatic): Secondary | ICD-10-CM | POA: Diagnosis not present

## 2015-12-21 DIAGNOSIS — I10 Essential (primary) hypertension: Secondary | ICD-10-CM

## 2015-12-21 DIAGNOSIS — Z5181 Encounter for therapeutic drug level monitoring: Secondary | ICD-10-CM | POA: Diagnosis not present

## 2015-12-21 DIAGNOSIS — E039 Hypothyroidism, unspecified: Secondary | ICD-10-CM

## 2015-12-21 DIAGNOSIS — I6523 Occlusion and stenosis of bilateral carotid arteries: Secondary | ICD-10-CM

## 2015-12-21 DIAGNOSIS — R001 Bradycardia, unspecified: Secondary | ICD-10-CM

## 2015-12-21 DIAGNOSIS — R7301 Impaired fasting glucose: Secondary | ICD-10-CM

## 2015-12-21 DIAGNOSIS — E7800 Pure hypercholesterolemia, unspecified: Secondary | ICD-10-CM | POA: Diagnosis not present

## 2015-12-21 DIAGNOSIS — D485 Neoplasm of uncertain behavior of skin: Secondary | ICD-10-CM | POA: Diagnosis not present

## 2015-12-21 LAB — HEMOGLOBIN A1C
HEMOGLOBIN A1C: 6.3 % — AB (ref <5.7)
Mean Plasma Glucose: 134 mg/dL

## 2015-12-21 MED ORDER — LEVOTHYROXINE SODIUM 88 MCG PO TABS: 88.0000 ug | ORAL_TABLET | Freq: Every day | ORAL | Status: DC

## 2015-12-21 MED ORDER — DIAZEPAM 2 MG PO TABS: 1.0000 mg | ORAL_TABLET | Freq: Three times a day (TID) | ORAL | Status: DC | PRN

## 2015-12-21 MED ORDER — IPRATROPIUM BROMIDE 0.03 % NA SOLN: 2.0000 | Freq: Three times a day (TID) | NASAL | Status: DC

## 2015-12-21 MED ORDER — ATORVASTATIN CALCIUM 40 MG PO TABS: 40.0000 mg | ORAL_TABLET | Freq: Every day | ORAL | Status: DC

## 2015-12-21 MED ORDER — LISINOPRIL 20 MG PO TABS: 20.0000 mg | ORAL_TABLET | Freq: Every day | ORAL | Status: DC

## 2015-12-21 MED ORDER — AMLODIPINE BESYLATE 5 MG PO TABS: 5.0000 mg | ORAL_TABLET | Freq: Every day | ORAL | Status: DC

## 2015-12-21 NOTE — Assessment & Plan Note (Signed)
Check lipid today; continue statin

## 2015-12-21 NOTE — Assessment & Plan Note (Signed)
Check sgpt on statin, check Cr and K+ on ACE-I

## 2015-12-21 NOTE — Assessment & Plan Note (Signed)
Seeing vascular doctor; on statin; goes back in August

## 2015-12-21 NOTE — Assessment & Plan Note (Signed)
Check fasting glucose today; check A1c; FSBS if indicated

## 2015-12-21 NOTE — Assessment & Plan Note (Addendum)
Check CBC today; and refer to hematologist if lymphocyte count over 5; explained that she may have a chronic condition of making too many white blood cells, may last for years before it's ever a problem

## 2015-12-21 NOTE — Assessment & Plan Note (Signed)
Pleased with reading today; continue medicines

## 2015-12-21 NOTE — Assessment & Plan Note (Signed)
Abnormal EKG, but compared to one from 2011, unchanged; tall peaked T waves are unchanged; 1st degree AV block unchanged; will see if TSH is markedly abnormal; if not, will refer to cardiologist

## 2015-12-21 NOTE — Patient Instructions (Signed)
I've referred you to a dermatologist about the spots on your nose and ear Use the diazepam (Valium) only if needed, and be very careful about falls and do not drive for 8 hours after taking that medicine If jaw/cheek still bothering you after another 2 weeks, please call me Let's get labs today If you have not heard anything from my staff in a week about any orders/referrals/studies from today, please contact us here to follow-up [phone] Try to limit saturated fats in your diet (bologna, hot dogs, barbeque, cheeseburgers, hamburgers, steak, bacon, sausage, cheese, etc.) and get more fresh fruits, vegetables, and whole grains

## 2015-12-21 NOTE — Progress Notes (Signed)
BP 140/58 mmHg  Pulse 47  Temp(Src) 98.7 F (37.1 C) (Oral)  Resp 16  Wt 132 lb (59.875 kg)  SpO2 97%  LMP  (LMP Unknown)   Subjective:    Patient ID: Sydney Mcdaniel, female    DOB: 02/02/1925, 82 y.o.   MRN: [id number]  HPI: Sydney Mcdaniel is a 82 y.o. female  Chief Complaint  Patient presents with  . Follow-up    6 month   Patient is well-known to me from previous clinic; she is accompanied by her daughter today  She has an issue with the left side of the jaw; she yawned one night and wasn't sure if she could get her mouth shut; can hear a noise when she chews; doesn't hurt; using ice compacts; on the June 15th; almost 2 weeks now; doesn't hurt now; just the popping noise when she chews  Hypothyroidism; energy fair; walks to the garden, cans beans; lost a little weight; daughter says she isn't eating as much as she used to eat now that she's by herself; husband has been gone a little over a year; trying to eat well, watching her sugar and cholesterol  Borderline diabetes at one point, then it got better; wonders if she needs to check her sugars at home  She has leg cramps; uses tonic water and saw vascular doctor and started magnesium  Hypertension; controlled today  Red spot on the left ear, also one on the nose  Depression screen [facility] 2/9 12/21/2015 06/22/2015 12/22/2014  Decreased Interest 0 0 3  Down, Depressed, Hopeless 0 1 3  PHQ - 2 Score 0 1 6  Altered sleeping - - 3  Tired, decreased energy - - 2  Change in appetite - - 1  Feeling bad or failure about yourself  - - 0  Trouble concentrating - - 3  Moving slowly or fidgety/restless - - 0  Suicidal thoughts - - 0  PHQ-9 Score - - 15   Relevant past medical, surgical, family and social history reviewed Past Medical History  Diagnosis Date  . Glaucoma   . H/O: hysterectomy     Total  . Lichen sclerosus   . Hypothyroidism   . Allergy   . Impaired fasting glucose   . Osteoporosis     Hips  .  Hyperlipidemia   . Hypertension    Past Surgical History  Procedure Laterality Date  . Total abdominal hysterectomy    . Eye surgery      Glaucoma   Family History  Problem Relation Age of Onset  . Heart attack Mother   . Glaucoma Mother   . Heart attack Father   . Parkinson's disease Brother   . Heart attack Brother    Social History  Substance Use Topics  . Smoking status: Never Smoker   . Smokeless tobacco: Never Used  . Alcohol Use: No  widowed June 2016  Interim medical history since last visit reviewed. Allergies and medications reviewed  Review of Systems Per HPI unless specifically indicated above     Objective:    BP 140/58 mmHg  Pulse 47  Temp(Src) 98.7 F (37.1 C) (Oral)  Resp 16  Wt 132 lb (59.875 kg)  SpO2 97%  LMP  (LMP Unknown)  Wt Readings from Last 3 Encounters:  12/21/15 132 lb (59.875 kg)  06/22/15 138 lb (62.596 kg)  12/22/14 144 lb (65.318 kg)   recheck pulse: 47-48  Physical Exam  Constitutional: She appears well-developed and well-nourished. She  does not appear ill. No distress.  Weight down 6 pounds over last 6 months  HENT:  Head: Normocephalic and atraumatic.  Mouth/Throat: Oropharynx is clear and moist and mucous membranes are normal.  Tender to palpation over left side masseter muscle; no nearby lymphadenopathy; no crepitus, no subluxation of the TMJ  Eyes: EOM are normal. No scleral icterus.  Neck: No thyromegaly present.  Cardiovascular: Regular rhythm and normal heart sounds.   No extrasystoles are present. Bradycardia present.  Exam reveals no gallop.   No murmur heard. Pulmonary/Chest: Effort normal and breath sounds normal. No respiratory distress. She has no wheezes.  Abdominal: Soft. Bowel sounds are normal. She exhibits no distension.  Musculoskeletal: Normal range of motion. She exhibits no edema.  Neurological: She is alert. She exhibits normal muscle tone.  Skin: Skin is warm and dry. Lesion (erythematous lesion  pinna of LEFT ear, slight dome with central esschar; lesion on left side tip of nose, mild erythema, perhaps slight telangiectasia) noted. She is not diaphoretic. No pallor.  Psychiatric: She has a normal mood and affect. Her behavior is normal. Judgment and thought content normal. Her mood appears not anxious. She does not exhibit a depressed mood.    Results for orders placed or performed in visit on 08/24/15  CBC with Differential/Platelet  Result Value Ref Range   WBC 8.8 3.4 - 10.8 x10E3/uL   RBC 3.62 (L) 3.77 - 5.28 x10E6/uL   Hemoglobin 11.1 11.1 - 15.9 g/dL   Hematocrit 32.6 (L) 34.0 - 46.6 %   MCV 90 79 - 97 fL   MCH 30.7 26.6 - 33.0 pg   MCHC 34.0 31.5 - 35.7 g/dL   RDW 14.5 12.3 - 15.4 %   Platelets 191 150 - 379 x10E3/uL   Neutrophils 36 %   Lymphs 56 %   Monocytes 6 %   Eos 2 %   Basos 0 %   Neutrophils Absolute 3.2 1.4 - 7.0 x10E3/uL   Lymphocytes Absolute 4.9 (H) 0.7 - 3.1 x10E3/uL   Monocytes Absolute 0.5 0.1 - 0.9 x10E3/uL   EOS (ABSOLUTE) 0.2 0.0 - 0.4 x10E3/uL   Basophils Absolute 0.0 0.0 - 0.2 x10E3/uL   Immature Granulocytes 0 %   Immature Grans (Abs) 0.0 0.0 - 0.1 x10E3/uL  ALT  Result Value Ref Range   ALT 17 0 - 32 IU/L  Lipid Panel w/o Chol/HDL Ratio  Result Value Ref Range   Cholesterol, Total 141 100 - 199 mg/dL   Triglycerides 124 0 - 149 mg/dL   HDL 52 >39 mg/dL   VLDL Cholesterol Cal 25 5 - 40 mg/dL   LDL Calculated 64 0 - 99 mg/dL      Assessment & Plan:   Problem List Items Addressed This Visit      Cardiovascular and Mediastinum   Essential hypertension, benign - Primary    Pleased with reading today; continue medicines      Relevant Medications   amLODipine (NORVASC) 5 MG tablet   atorvastatin (LIPITOR) 40 MG tablet   lisinopril (PRINIVIL,ZESTRIL) 20 MG tablet   Carotid atherosclerosis    Seeing vascular doctor; on statin; goes back in August      Relevant Medications   amLODipine (NORVASC) 5 MG tablet   atorvastatin  (LIPITOR) 40 MG tablet   lisinopril (PRINIVIL,ZESTRIL) 20 MG tablet   Other Relevant Orders   Lipid panel     Endocrine   Impaired fasting glucose    Check fasting glucose today; check A1c; FSBS if  indicated      Relevant Orders   Hemoglobin A1c   Hypothyroidism    Check TSH      Relevant Medications   levothyroxine (SYNTHROID, LEVOTHROID) 88 MCG tablet   Other Relevant Orders   T4, free   TSH     Nervous and Auditory   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   Relevant Orders   Ambulatory referral to Dermatology     Musculoskeletal and Integument   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   Relevant Orders   Ambulatory referral to Dermatology     Other   Medication monitoring encounter    Check sgpt on statin, check Cr and K+ on ACE-I      Relevant Orders   COMPLETE METABOLIC PANEL WITH GFR   Lymphocytosis    Check CBC today; and refer to hematologist if lymphocyte count over 5; explained that she may have a chronic condition of making too many white blood cells, may last for years before it's ever a problem      Relevant Orders   CBC with Differential/Platelet   CBC w/Diff/Platelet   Hypercholesteremia    Check lipid today; continue statin      Relevant Medications   amLODipine (NORVASC) 5 MG tablet   atorvastatin (LIPITOR) 40 MG tablet   lisinopril (PRINIVIL,ZESTRIL) 20 MG tablet   Other Relevant Orders   Lipid panel   Bradycardia    Abnormal EKG, but compared to one from 2011, unchanged; tall peaked T waves are unchanged; 1st degree AV block unchanged; will see if TSH is markedly abnormal; if not, will refer to cardiologist      Relevant Orders   EKG 12-Lead   T4, free   TSH   Magnesium      Follow up plan: Return in about 3 months (around 03/22/2016) for follow-up.  An after-visit summary was printed and given to the patient at [place].  Please see the patient instructions which may contain other information and recommendations  beyond what is mentioned above in the assessment and plan.  Meds ordered this encounter  Medications  . diazepam (VALIUM) 2 MG tablet    Sig: Take 0.5 tablets (1 mg total) by mouth every 8 (eight) hours as needed for anxiety.    Dispense:  10 tablet    Refill:  0  . amLODipine (NORVASC) 5 MG tablet    Sig: Take 1 tablet (5 mg total) by mouth daily.    Dispense:  90 tablet    Refill:  3  . atorvastatin (LIPITOR) 40 MG tablet    Sig: Take 1 tablet (40 mg total) by mouth at bedtime.    Dispense:  90 tablet    Refill:  3    CANCEL the Rx for the 20 mg, we're having to go up to 40 mg now  . levothyroxine (SYNTHROID, LEVOTHROID) 88 MCG tablet    Sig: Take 1 tablet (88 mcg total) by mouth daily before breakfast.    Dispense:  90 tablet    Refill:  3  . lisinopril (PRINIVIL,ZESTRIL) 20 MG tablet    Sig: Take 1 tablet (20 mg total) by mouth daily.    Dispense:  90 tablet    Refill:  3  . ipratropium (ATROVENT) 0.03 % nasal spray    Sig: Place 2 sprays into both nostrils 3 (three) times daily.    Dispense:  90 mL    Refill:  3    Orders Placed  This Encounter  Procedures  . Lipid panel  . CBC with Differential/Platelet  . Hemoglobin A1c  . COMPLETE METABOLIC PANEL WITH GFR  . T4, free  . TSH  . Magnesium  . CBC w/Diff/Platelet  . Ambulatory referral to Dermatology  . EKG 12-Lead

## 2015-12-21 NOTE — Assessment & Plan Note (Signed)
Check TSH 

## 2015-12-22 ENCOUNTER — Telehealth: Payer: Self-pay | Admitting: Family Medicine

## 2015-12-22 ENCOUNTER — Other Ambulatory Visit
Admission: RE | Admit: 2015-12-22 | Discharge: 2015-12-22 | Disposition: A | Discharge status: Home / Self Care | Payer: Medicare Other | Source: Ambulatory Visit | Attending: Family Medicine | Admitting: Family Medicine

## 2015-12-22 DIAGNOSIS — D72820 Lymphocytosis (symptomatic): Secondary | ICD-10-CM

## 2015-12-22 DIAGNOSIS — E875 Hyperkalemia: Secondary | ICD-10-CM | POA: Diagnosis not present

## 2015-12-22 LAB — LIPID PANEL
CHOL/HDL RATIO: 2.1 ratio (ref <=5.0)
Cholesterol: 128 mg/dL (ref 125–200)
HDL: 62 mg/dL (ref >=46)
LDL Cholesterol: 44 mg/dL (ref <130)
Triglycerides: 111 mg/dL (ref <150)
VLDL: 22 mg/dL (ref <30)

## 2015-12-22 LAB — COMPLETE METABOLIC PANEL WITH GFR
ALT: 12 U/L (ref 6–29)
AST: 19 U/L (ref 10–35)
Albumin: 4 g/dL (ref 3.6–5.1)
Alkaline Phosphatase: 24 U/L — ABNORMAL LOW (ref 33–130)
BILIRUBIN TOTAL: 0.6 mg/dL (ref 0.2–1.2)
BUN: 13 mg/dL (ref 7–25)
CALCIUM: 9.2 mg/dL (ref 8.6–10.4)
CHLORIDE: 99 mmol/L (ref 98–110)
CO2: 24 mmol/L (ref 20–31)
CREATININE: 0.83 mg/dL (ref 0.60–0.88)
GFR, EST AFRICAN AMERICAN: 73 mL/min (ref >=60)
GFR, EST NON AFRICAN AMERICAN: 63 mL/min (ref >=60)
Glucose, Bld: 102 mg/dL — ABNORMAL HIGH (ref 65–99)
Potassium: 5.5 mmol/L — ABNORMAL HIGH (ref 3.5–5.3)
Sodium: 133 mmol/L — ABNORMAL LOW (ref 135–146)
Total Protein: 6.8 g/dL (ref 6.1–8.1)

## 2015-12-22 LAB — T4, FREE: Free T4: 1.8 ng/dL (ref 0.8–1.8)

## 2015-12-22 LAB — CBC WITH DIFFERENTIAL/PLATELET
BASOS ABS: 0 {cells}/uL (ref 0–200)
Basophils Relative: 0 %
EOS ABS: 184 {cells}/uL (ref 15–500)
EOS PCT: 2 %
HCT: 32.9 % — ABNORMAL LOW (ref 35.0–45.0)
Hemoglobin: 11 g/dL — ABNORMAL LOW (ref 11.7–15.5)
LYMPHS PCT: 60 %
Lymphs Abs: 5520 cells/uL — ABNORMAL HIGH (ref 850–3900)
MCH: 30.3 pg (ref 27.0–33.0)
MCHC: 33.4 g/dL (ref 32.0–36.0)
MCV: 90.6 fL (ref 80.0–100.0)
MONOS PCT: 6 %
MPV: 9.2 fL (ref 7.5–12.5)
Monocytes Absolute: 552 cells/uL (ref 200–950)
Neutro Abs: 2944 cells/uL (ref 1500–7800)
Neutrophils Relative %: 32 %
PLATELETS: 219 10*3/uL (ref 140–400)
RBC: 3.63 MIL/uL — ABNORMAL LOW (ref 3.80–5.10)
RDW: 14.5 % (ref 11.0–15.0)
WBC: 9.2 10*3/uL (ref 3.8–10.8)

## 2015-12-22 LAB — TSH: TSH: 0.4 m[IU]/L

## 2015-12-22 LAB — POTASSIUM: Potassium: 4.6 mmol/L (ref 3.5–5.1)

## 2015-12-22 LAB — MAGNESIUM: MAGNESIUM: 2.2 mg/dL (ref 1.5–2.5)

## 2015-12-22 MED ORDER — LEVOTHYROXINE SODIUM 75 MCG PO TABS: 75.0000 ug | ORAL_TABLET | Freq: Every day | ORAL | Status: DC

## 2015-12-22 NOTE — Assessment & Plan Note (Addendum)
Over 5k now but with normal total white count; slow gradual trend up; will put in low priority referral to heme-onc

## 2015-12-22 NOTE — Telephone Encounter (Signed)
I called, left msg for daughter; calling about labs Potassium needs to be rechecked; please go to the hospital to have that rechecked tomorrow; if any salt substitutes or potassium pills, STOP those We'll go over other labs tomorrow

## 2015-12-22 NOTE — Telephone Encounter (Signed)
I spoke with daughter; explained labs; cut lipitor in half; decrease thyroid med from 33 to 75; recheck TSH in 6 to 8 weeks; explained referral, LOW priority but I'd like to plug her in to hematologist at this point

## 2015-12-24 ENCOUNTER — Telehealth: Payer: Self-pay | Admitting: Family Medicine

## 2015-12-24 NOTE — Telephone Encounter (Signed)
Re submitted referrral to [facility]

## 2015-12-24 NOTE — Telephone Encounter (Signed)
Pts daughter would like a call back about referral to oncologist.

## 2016-01-10 ENCOUNTER — Encounter: Payer: Self-pay | Admitting: Hematology and Oncology

## 2016-01-10 ENCOUNTER — Inpatient Hospital Stay: Payer: Medicare Other | Attending: Hematology and Oncology | Admitting: Hematology and Oncology

## 2016-01-10 ENCOUNTER — Inpatient Hospital Stay: Payer: Medicare Other

## 2016-01-10 VITALS — BP 156/58 | HR 60 | Temp 98.3°F | Resp 18 | Ht 62.21 in | Wt 133.4 lb

## 2016-01-10 DIAGNOSIS — D72820 Lymphocytosis (symptomatic): Secondary | ICD-10-CM | POA: Diagnosis not present

## 2016-01-10 DIAGNOSIS — Z7982 Long term (current) use of aspirin: Secondary | ICD-10-CM | POA: Diagnosis not present

## 2016-01-10 DIAGNOSIS — E785 Hyperlipidemia, unspecified: Secondary | ICD-10-CM | POA: Insufficient documentation

## 2016-01-10 DIAGNOSIS — D509 Iron deficiency anemia, unspecified: Secondary | ICD-10-CM

## 2016-01-10 DIAGNOSIS — D649 Anemia, unspecified: Secondary | ICD-10-CM

## 2016-01-10 DIAGNOSIS — Z79899 Other long term (current) drug therapy: Secondary | ICD-10-CM

## 2016-01-10 DIAGNOSIS — E039 Hypothyroidism, unspecified: Secondary | ICD-10-CM | POA: Insufficient documentation

## 2016-01-10 DIAGNOSIS — I10 Essential (primary) hypertension: Secondary | ICD-10-CM | POA: Diagnosis not present

## 2016-01-10 DIAGNOSIS — C9110 Chronic lymphocytic leukemia of B-cell type not having achieved remission: Secondary | ICD-10-CM | POA: Insufficient documentation

## 2016-01-10 DIAGNOSIS — M818 Other osteoporosis without current pathological fracture: Secondary | ICD-10-CM | POA: Diagnosis not present

## 2016-01-10 LAB — CBC WITH DIFFERENTIAL/PLATELET
Basophils Absolute: 0 10*3/uL (ref 0–0.1)
Basophils Relative: 0 %
Eosinophils Absolute: 0.2 10*3/uL (ref 0–0.7)
Eosinophils Relative: 2 %
HCT: 31.6 % — ABNORMAL LOW (ref 35.0–47.0)
Hemoglobin: 11.4 g/dL — ABNORMAL LOW (ref 12.0–16.0)
Lymphocytes Relative: 58 %
Lymphs Abs: 6.4 10*3/uL — ABNORMAL HIGH (ref 1.0–3.6)
MCH: 32.2 pg (ref 26.0–34.0)
MCHC: 36.2 g/dL — ABNORMAL HIGH (ref 32.0–36.0)
MCV: 89 fL (ref 80.0–100.0)
Monocytes Absolute: 0.7 10*3/uL (ref 0.2–0.9)
Monocytes Relative: 6 %
Neutro Abs: 3.7 10*3/uL (ref 1.4–6.5)
Neutrophils Relative %: 34 %
Platelets: 203 10*3/uL (ref 150–440)
RBC: 3.55 MIL/uL — ABNORMAL LOW (ref 3.80–5.20)
RDW: 14.5 % (ref 11.5–14.5)
WBC: 11 10*3/uL (ref 3.6–11.0)

## 2016-01-10 LAB — RETICULOCYTES
RBC.: 3.55 MIL/uL — ABNORMAL LOW (ref 3.80–5.20)
Retic Count, Absolute: 32 10*3/uL (ref 19.0–183.0)
Retic Ct Pct: 0.9 % (ref 0.4–3.1)

## 2016-01-10 LAB — URIC ACID: Uric Acid, Serum: 4.5 mg/dL (ref 2.3–6.6)

## 2016-01-10 LAB — VITAMIN B12: Vitamin B-12: 119 pg/mL — ABNORMAL LOW (ref 180–914)

## 2016-01-10 LAB — FOLATE: Folate: 44 ng/mL (ref >5.9)

## 2016-01-10 LAB — FERRITIN: Ferritin: 116 ng/mL (ref 11–307)

## 2016-01-10 NOTE — Progress Notes (Signed)
Patient is here for new patient evaluation  

## 2016-01-10 NOTE — Progress Notes (Signed)
[facility] day:  01/10/2016  Chief Complaint: Sydney Mcdaniel is a 82 y.o. female with lymphocytosis who is referred by Dr. Sanda Klein in consultation for assessment and management.  HPI:  The patient notes states that she saw a hematologist years ago.  She was put on iron pills.  She took iron for several years.  Recently, she stopped taking iron in 05/2015.  She denies any melena or hematochezia.  Last colonoscopy was > 10 years ago.    She states that her diet is "ok".  She eats a lot less since her husband died (12-04-2014).  She eats cereal for breakfast.  She eats 1/2 sandwich for lunch.  She eats vegetables including green beans, butter beans, and potatoes.  She has no history of recurrent infections.  She had a UTI in 05/2015.  CBC on 06/22/2015 revealed a hematocrit of 35.3, hemoglobin 11.6, MCV 92, platelets 202,000, WBC 7900 with an ANC of 2600.  Absolute lymphocyte count was 4700.  CBC on 08/24/2015 revealed a hematocrit of 32.6, hemoglobin 11.1, MCV 90, platelets 191,000, WBC 8800 with an ANC of 3200.  Absolute lymphocyte count was 4900. CBC on 12/21/2015 revealed a hematocrit of 32.9, hemoglobin 11.0, MCV 90.6, platelets 219,000, WBC 9200 with an Alex of 2944.  Absolute lymphocyte count was 5520.  Symptomatically, she feels "pretty good".  She denies any B symptoms.  Notes indicate that she has lost 6 pounds in the past 6 months.  She has felt "weaker for a few weeks".     Past Medical History  Diagnosis Date  . Glaucoma   . H/O: hysterectomy     Total  . Lichen sclerosus   . Hypothyroidism   . Allergy   . Impaired fasting glucose   . Osteoporosis     Hips  . Hyperlipidemia   . Hypertension   . Cataract   . Anemia     Past Surgical History  Procedure Laterality Date  . Total abdominal hysterectomy    . Eye surgery      Glaucoma  . Appendectomy      Family History  Problem Relation Age of Onset  . Heart attack Mother   .  Glaucoma Mother   . Heart attack Father   . Parkinson's disease Brother   . Heart attack Brother     Social History:  reports that she has never smoked. She has never used smokeless tobacco. She reports that she does not drink alcohol or use illicit drugs.  Her husband died in 2014-12-04.  The patient is accompanied by her daughter, [name], today.  Allergies:  Allergies  Allergen Reactions  . Alphagan [Brimonidine]   . Pravachol [Pravastatin Sodium]     Current Medications: Current Outpatient Prescriptions  Medication Sig Dispense Refill  . amLODipine (NORVASC) 5 MG tablet Take 1 tablet (5 mg total) by mouth daily. 90 tablet 3  . aspirin 81 MG tablet Take 81 mg by mouth daily.    Marland Kitchen atorvastatin (LIPITOR) 40 MG tablet Take 0.5 tablets (20 mg total) by mouth at bedtime. 90 tablet 3  . Calcium Carb-Cholecalciferol (CALCIUM 600 + D PO) Take by mouth daily.    Marland Kitchen ipratropium (ATROVENT) 0.03 % nasal spray Place 2 sprays into both nostrils 3 (three) times daily. 90 mL 3  . latanoprost (XALATAN) 0.005 % ophthalmic solution     . levothyroxine (SYNTHROID, LEVOTHROID) 75 MCG tablet Take 1 tablet (75 mcg total) by mouth daily. [place]  tablet 1  . lisinopril (PRINIVIL,ZESTRIL) 20 MG tablet Take 1 tablet (20 mg total) by mouth daily. 90 tablet 3  . Multiple Vitamins-Minerals (MULTIVITAMIN ADULT PO) Take by mouth.    . Omega-3 Fatty Acids (FISH OIL) 1200 MG CAPS Take by mouth daily.    . timolol (TIMOPTIC) 0.5 % ophthalmic solution     . diazepam (VALIUM) 2 MG tablet Take 0.5 tablets (1 mg total) by mouth every 8 (eight) hours as needed for anxiety. (Patient not taking: Reported on 01/10/2016) 10 tablet 0   No current facility-administered medications for this visit.    Review of Systems:  GENERAL:  Feels pretty good.  No fevers or sweats.  Weight loss of 6 pounds in 6 months. PERFORMANCE STATUS (ECOG):  1 HEENT:  Glaucoma.  No visual changes, runny nose, sore throat, mouth sores or tenderness. Lungs:  No shortness of breath or cough.  No hemoptysis. Cardiac:  No chest pain, palpitations, orthopnea, or PND. GI:  No nausea, vomiting, diarrhea, constipation, melena or hematochezia.  Colonoscopy > 10 years ago. GU:  No urgency, frequency, dysuria, or hematuria. Up 2-3 times at night. Musculoskeletal:  No back pain.  No joint pain.  No muscle tenderness. Extremities:  No pain or swelling. Skin:  No rashes or skin changes. Neuro:  General weakness.  No headache, numbness or weakness, balance or coordination issues. Endocrine:  No diabetes.  On Synthroid for thyroid disease.  No hot flashes or night sweats. Psych:  No mood changes, depression or anxiety. Pain:  No focal pain. Review of systems:  All other systems reviewed and found to be negative.  Physical Exam: Blood pressure 156/58, pulse 60, temperature 98.3 F (36.8 C), temperature source Tympanic, resp. rate 18, height 5' 2.21" (1.58 m), weight 133 lb 6.1 oz (60.5 kg). GENERAL:  Well developed, well nourished, sitting comfortably in the exam room in no acute distress. MENTAL STATUS:  Alert and oriented to person, place and time. HEAD:  Curly gray hair.  Normocephalic, atraumatic, face symmetric, no Cushingoid features. EYES:  Glasses.  Blue eyes.  Pupils equal round and reactive to light and accomodation.  No conjunctivitis or scleral icterus. ENT:  Oropharynx clear without lesion.  Tongue normal. Mucous membranes moist.  RESPIRATORY:  Clear to auscultation without rales, wheezes or rhonchi. CARDIOVASCULAR:  Regular rate and rhythm without murmur, rub or gallop. ABDOMEN:  Soft, non-tender, with active bowel sounds, and no hepatosplenomegaly.  No masses. SKIN:  No rashes, ulcers or lesions. EXTREMITIES: No edema, no skin discoloration or tenderness.  No palpable cords. LYMPH NODES: No palpable cervical, supraclavicular, axillary or inguinal adenopathy  NEUROLOGICAL: Unremarkable. PSYCH:  Appropriate.   No visits with results within 3  Day(s) from this visit. Latest known visit with results is:  Hospital Outpatient Visit on 12/22/2015  Component Date Value Ref Range Status  . Potassium 12/22/2015 4.6  3.5 - 5.1 mmol/L Final    Assessment:  Sydney Mcdaniel is a 82 y.o. female with distant history of anemia and recent lymphocytosis.  She was previously on oral iron.  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  Absolute lymphocyte count (ALC) has ranged between 4700 - 5500.  Symptomatically, she has felt a little more fatigued.  She has lost 6 pounds in the past 6 months, but notes that she has been eating less since the death of her husband in 2014-12-13.  Diet is modest.  She denies any melena or hematochezia.  Last colonoscopy was >  10 years ago.  Exam reveals no palpable adenopathy or hepatosplenomegaly.  Plan: 1.  Review recent CBCs.  Patient has a normocytic anemia and mild lymphocytosis.  We discussed checking iron stores given her history of apparent iron deficiency anemia.  We discussed checking vitamin levels given her modest diet.  Given her age and persistent lymphocytosis, she may have CLL.  We discussed sending blood for flow cytometry.  She may have monoclonal B-cell lymphocytosis (MBL) as her lymphocyte count appears to be < 5,000.  However, this would not explain her mild anemia.  We discussed staging and management of CLL.  If she CLL, she likely has stage 0 disease (hemoglobin > 10 and platelets > 100,000; no adenopathy or hepatosplenomegaly).  We briefly discussed indications for treatment and the likely plan for ongoing monitoring without intervention. 2.  Labs today:  CBC with diff, ferritin, B12, folate, retic, uric acid. 3.  Peripheral blood for flow cytometry. 3.  Guaiac cards x 3. 4.  RTC in 2 weeks for MD assessment and review of work-up.   Lequita Asal, MD  01/10/2016, 3:23 PM

## 2016-01-11 ENCOUNTER — Other Ambulatory Visit: Payer: Self-pay | Admitting: Hematology and Oncology

## 2016-01-11 ENCOUNTER — Telehealth: Payer: Self-pay

## 2016-01-11 DIAGNOSIS — E538 Deficiency of other specified B group vitamins: Secondary | ICD-10-CM | POA: Insufficient documentation

## 2016-01-11 NOTE — Telephone Encounter (Signed)
-----   Message from Lequita Asal, MD sent at 01/11/2016  2:49 PM EDT ----- Regarding: Please call patient  B12 deficient.  Preauth B12.  Orders in.  M  ----- Message -----    From: Lab In [place] Interface    Sent: 01/10/2016   4:47 PM      To: Lequita Asal, MD

## 2016-01-11 NOTE — Telephone Encounter (Signed)
Called patient and let her know results below and advised we would call her back once we get the authorization. And she would like [place] to call her daughter and let her know so we can schedule her shots

## 2016-01-12 ENCOUNTER — Encounter: Payer: Self-pay | Admitting: Family Medicine

## 2016-01-12 DIAGNOSIS — M26609 Unspecified temporomandibular joint disorder, unspecified side: Secondary | ICD-10-CM

## 2016-01-12 NOTE — Telephone Encounter (Signed)
-----   Message from Lequita Asal, MD sent at 01/11/2016  2:49 PM EDT ----- Regarding: Please call patient  B12 deficient.  Preauth B12.  Orders in.  M  ----- Message -----    From: Lab In [place] Interface    Sent: 01/10/2016   4:47 PM      To: Lequita Asal, MD

## 2016-01-12 NOTE — Telephone Encounter (Signed)
called pt and daughter and let them know we can arrange injections on pt for b12 and she will need 1 inj. Weekly x 6 weeks and because pt has appt 8/8 if ok with pt and daughter we can make inj. On each tues starting 7/24, 98/1, 8/8, 8/15,8/22, 8/29.  Daughter states morning appts is great , earllier rather than later in am.  On 8/1 she has dermatology appt at 8:15 and would like that appt at 9:15 to make sure she has time at the first doctor and then just come over to get inj.  The daughter works in afternoon each day so mornings is the only time she can come. I told her when I looked on sch. The injection chair for 8/1 did not have the first inj. Til 9:45 so it should be safe to do 9:15 and I will send a message to staff asking for the above and then have someone call her, Daughter is agreeable to plan and pt says whatever daughter says because she drives her to appts

## 2016-01-13 DIAGNOSIS — M26609 Unspecified temporomandibular joint disorder, unspecified side: Secondary | ICD-10-CM | POA: Insufficient documentation

## 2016-01-13 LAB — COMP PANEL: LEUKEMIA/LYMPHOMA: Immunophenotypic Profile: 32

## 2016-01-18 ENCOUNTER — Inpatient Hospital Stay: Payer: Medicare Other

## 2016-01-18 DIAGNOSIS — I10 Essential (primary) hypertension: Secondary | ICD-10-CM | POA: Diagnosis not present

## 2016-01-18 DIAGNOSIS — Z79899 Other long term (current) drug therapy: Secondary | ICD-10-CM | POA: Diagnosis not present

## 2016-01-18 DIAGNOSIS — D72820 Lymphocytosis (symptomatic): Secondary | ICD-10-CM | POA: Diagnosis not present

## 2016-01-18 DIAGNOSIS — D509 Iron deficiency anemia, unspecified: Secondary | ICD-10-CM | POA: Diagnosis not present

## 2016-01-18 DIAGNOSIS — E538 Deficiency of other specified B group vitamins: Secondary | ICD-10-CM

## 2016-01-18 DIAGNOSIS — E785 Hyperlipidemia, unspecified: Secondary | ICD-10-CM | POA: Diagnosis not present

## 2016-01-18 DIAGNOSIS — E039 Hypothyroidism, unspecified: Secondary | ICD-10-CM | POA: Diagnosis not present

## 2016-01-18 MED ORDER — CYANOCOBALAMIN 1000 MCG/ML IJ SOLN
1000.0000 ug | Freq: Once | INTRAMUSCULAR | Status: AC
Start: 2016-01-18 — End: 2016-01-18
  Administered 2016-01-18: 1000 ug via INTRAMUSCULAR
  Filled 2016-01-18: qty 1

## 2016-01-25 ENCOUNTER — Encounter: Payer: Self-pay | Admitting: Family Medicine

## 2016-01-25 ENCOUNTER — Inpatient Hospital Stay: Payer: Medicare Other | Attending: Hematology and Oncology

## 2016-01-25 DIAGNOSIS — Z79899 Other long term (current) drug therapy: Secondary | ICD-10-CM | POA: Diagnosis not present

## 2016-01-25 DIAGNOSIS — E039 Hypothyroidism, unspecified: Secondary | ICD-10-CM | POA: Insufficient documentation

## 2016-01-25 DIAGNOSIS — C9110 Chronic lymphocytic leukemia of B-cell type not having achieved remission: Secondary | ICD-10-CM | POA: Insufficient documentation

## 2016-01-25 DIAGNOSIS — Z7982 Long term (current) use of aspirin: Secondary | ICD-10-CM | POA: Insufficient documentation

## 2016-01-25 DIAGNOSIS — L578 Other skin changes due to chronic exposure to nonionizing radiation: Secondary | ICD-10-CM | POA: Diagnosis not present

## 2016-01-25 DIAGNOSIS — Z90710 Acquired absence of both cervix and uterus: Secondary | ICD-10-CM | POA: Insufficient documentation

## 2016-01-25 DIAGNOSIS — L820 Inflamed seborrheic keratosis: Secondary | ICD-10-CM | POA: Diagnosis not present

## 2016-01-25 DIAGNOSIS — D692 Other nonthrombocytopenic purpura: Secondary | ICD-10-CM | POA: Diagnosis not present

## 2016-01-25 DIAGNOSIS — L821 Other seborrheic keratosis: Secondary | ICD-10-CM | POA: Diagnosis not present

## 2016-01-25 DIAGNOSIS — I10 Essential (primary) hypertension: Secondary | ICD-10-CM | POA: Diagnosis not present

## 2016-01-25 DIAGNOSIS — E785 Hyperlipidemia, unspecified: Secondary | ICD-10-CM | POA: Diagnosis not present

## 2016-01-25 DIAGNOSIS — D229 Melanocytic nevi, unspecified: Secondary | ICD-10-CM | POA: Diagnosis not present

## 2016-01-25 DIAGNOSIS — L570 Actinic keratosis: Secondary | ICD-10-CM | POA: Diagnosis not present

## 2016-01-25 DIAGNOSIS — E538 Deficiency of other specified B group vitamins: Secondary | ICD-10-CM

## 2016-01-25 MED ORDER — CYANOCOBALAMIN 1000 MCG/ML IJ SOLN
1000.0000 ug | Freq: Once | INTRAMUSCULAR | Status: AC
  Administered 2016-01-25: 1000 ug via INTRAMUSCULAR
  Filled 2016-01-25: qty 1

## 2016-02-01 ENCOUNTER — Inpatient Hospital Stay: Payer: Medicare Other

## 2016-02-01 ENCOUNTER — Other Ambulatory Visit: Payer: Self-pay | Admitting: *Deleted

## 2016-02-01 ENCOUNTER — Other Ambulatory Visit: Payer: Self-pay

## 2016-02-01 ENCOUNTER — Inpatient Hospital Stay (HOSPITAL_BASED_OUTPATIENT_CLINIC_OR_DEPARTMENT_OTHER): Payer: Medicare Other | Admitting: Hematology and Oncology

## 2016-02-01 ENCOUNTER — Encounter: Payer: Self-pay | Admitting: Hematology and Oncology

## 2016-02-01 ENCOUNTER — Other Ambulatory Visit: Payer: Self-pay | Admitting: Hematology and Oncology

## 2016-02-01 ENCOUNTER — Ambulatory Visit: Payer: Medicare Other

## 2016-02-01 VITALS — BP 136/53 | HR 50 | Temp 97.4°F | Resp 18 | Wt 134.5 lb

## 2016-02-01 DIAGNOSIS — I10 Essential (primary) hypertension: Secondary | ICD-10-CM | POA: Diagnosis not present

## 2016-02-01 DIAGNOSIS — D649 Anemia, unspecified: Secondary | ICD-10-CM

## 2016-02-01 DIAGNOSIS — E785 Hyperlipidemia, unspecified: Secondary | ICD-10-CM | POA: Diagnosis not present

## 2016-02-01 DIAGNOSIS — C9110 Chronic lymphocytic leukemia of B-cell type not having achieved remission: Secondary | ICD-10-CM

## 2016-02-01 DIAGNOSIS — E538 Deficiency of other specified B group vitamins: Secondary | ICD-10-CM

## 2016-02-01 DIAGNOSIS — Z79899 Other long term (current) drug therapy: Secondary | ICD-10-CM | POA: Diagnosis not present

## 2016-02-01 DIAGNOSIS — E039 Hypothyroidism, unspecified: Secondary | ICD-10-CM | POA: Diagnosis not present

## 2016-02-01 LAB — OCCULT BLOOD X 1 CARD TO LAB, STOOL
Fecal Occult Bld: NEGATIVE
Fecal Occult Bld: NEGATIVE
Fecal Occult Bld: NEGATIVE

## 2016-02-01 MED ORDER — CYANOCOBALAMIN 1000 MCG/ML IJ SOLN
1000.0000 ug | Freq: Once | INTRAMUSCULAR | Status: AC
  Administered 2016-02-01: 1000 ug via INTRAMUSCULAR
  Filled 2016-02-01: qty 1

## 2016-02-01 NOTE — Progress Notes (Signed)
Offers no complaints. States is feeling well. Brought in 3 stool cards which were taken to the lab. Has had 1lb weight gain since last visit.

## 2016-02-01 NOTE — Progress Notes (Signed)
Hand Clinic day:  02/01/16  Chief Complaint: Sydney Mcdaniel is a 82 y.o. female with a normocytic anemia and lymphocytosis who is seen for review of work-up and discussion regarding direction of therapy.  HPI:  The patient was last seen in the hematology clinic on 01/10/2016.  At that time, she was see for initial consultation.  She had a mild lymphocytosis dating back to 05/2015.  She described a history of chronic anemia for which she had been on iron pills for some time (stopped 05/2015).  She denied any melena or hematochezia.  Diet was modest after the death of her huband in 12/15/14.  She described 1 infection (UTI).  She felt a little fatigued. Exam revealed no adenopathy or hepatosplenomegaly.  She underwent a work-up.  CBC revealed a hematocrit of 31.6, hemoglobin 11.4, MCV 89, platelets 203,000, WBC 11,00 with an ANC of 3700.  Absolute lymphocyte count was 6400.  Ferritin was 116.  B12 was 119 (low).  Folate was 44.0.  Retic was 0.9% (low).  Uric acid was 4.5.  She was contacted regarding her low B12 level.  She was started on weekly B12 x 6 on 01/18/2016.  Peripheral blood flow cytometry revealed involvement by a CD5+, CD23+, CD38- monoclonal B cell population with lambda light chain restriction consistent with CLL/SLL.  There was a small population (8% of the T cells) of double positive (CD4+/CD8+) T cells (described in association with chronic viral infections, autoimmune disorders, chronic inflammatory disorders, and immunodeficiency states).  There were no circulating blasts are detected.   Symptomatically, she has felt stronger since initiation of B12.  She denies any B symptoms.   Past Medical History:  Diagnosis Date  . Allergy   . Anemia   . Cataract   . Glaucoma   . H/O: hysterectomy    Total  . Hyperlipidemia   . Hypertension   . Hypothyroidism   . Impaired fasting glucose   . Lichen sclerosus   . Osteoporosis    Hips     Past Surgical History:  Procedure Laterality Date  . APPENDECTOMY    . EYE SURGERY     Glaucoma  . TOTAL ABDOMINAL HYSTERECTOMY      Family History  Problem Relation Age of Onset  . Heart attack Mother   . Glaucoma Mother   . Heart attack Father   . Parkinson's disease Brother   . Heart attack Brother     Social History:  reports that she has never smoked. She has never used smokeless tobacco. She reports that she does not drink alcohol or use drugs.  Her husband died in 12-15-2014.  The patient is accompanied by her daughter, [name], today.  Allergies:  Allergies  Allergen Reactions  . Alphagan [Brimonidine]   . Pravachol [Pravastatin Sodium]     Current Medications: Current Outpatient Prescriptions  Medication Sig Dispense Refill  . amLODipine (NORVASC) 5 MG tablet Take 1 tablet (5 mg total) by mouth daily. 90 tablet 3  . aspirin 81 MG tablet Take 81 mg by mouth daily.    Marland Kitchen atorvastatin (LIPITOR) 40 MG tablet Take 0.5 tablets (20 mg total) by mouth at bedtime. 90 tablet 3  . Calcium Carb-Cholecalciferol (CALCIUM 600 + D PO) Take by mouth daily.    . diazepam (VALIUM) 2 MG tablet Take 0.5 tablets (1 mg total) by mouth every 8 (eight) hours as needed for anxiety. 10 tablet 0  . ipratropium (ATROVENT) 0.03 % nasal  spray Place 2 sprays into both nostrils 3 (three) times daily. 90 mL 3  . latanoprost (XALATAN) 0.005 % ophthalmic solution     . levothyroxine (SYNTHROID, LEVOTHROID) 75 MCG tablet Take 1 tablet (75 mcg total) by mouth daily. 30 tablet 1  . lisinopril (PRINIVIL,ZESTRIL) 20 MG tablet Take 1 tablet (20 mg total) by mouth daily. 90 tablet 3  . Multiple Vitamins-Minerals (MULTIVITAMIN ADULT PO) Take by mouth.    . Omega-3 Fatty Acids (FISH OIL) 1200 MG CAPS Take by mouth daily.    . timolol (TIMOPTIC) 0.5 % ophthalmic solution      No current facility-administered medications for this visit.     Review of Systems:  GENERAL:  Feels stronger.  No fevers or sweats.   Weight up 1 pound. PERFORMANCE STATUS (ECOG):  1 HEENT:  Glaucoma.  No visual changes, runny nose, sore throat, mouth sores or tenderness. Lungs: No shortness of breath or cough.  No hemoptysis. Cardiac:  No chest pain, palpitations, orthopnea, or PND. GI:  No nausea, vomiting, diarrhea, constipation, melena or hematochezia.  Colonoscopy > 10 years ago. GU:  No urgency, frequency, dysuria, or hematuria. Up 2-3 times at night. Musculoskeletal:  No back pain.  No joint pain.  No muscle tenderness. Extremities:  No pain or swelling. Skin:  No rashes or skin changes. Neuro:  No headache, numbness or weakness, balance or coordination issues. Endocrine:  No diabetes.  On Synthroid for thyroid disease.  No hot flashes or night sweats. Psych:  No mood changes, depression or anxiety. Pain:  No focal pain. Review of systems:  All other systems reviewed and found to be negative.  Physical Exam: Blood pressure (!) 136/53, pulse (!) 50, temperature 97.4 F (36.3 C), temperature source Tympanic, resp. rate 18, weight 134 lb 7.7 oz (61 kg). GENERAL:  Well developed, well nourished, sitting comfortably in the exam room in no acute distress. MENTAL STATUS:  Alert and oriented to person, place and time. HEAD:  Curly gray hair.  Normocephalic, atraumatic, face symmetric, no Cushingoid features. EYES:  Glasses.  Blue eyes.  No conjunctivitis or scleral icterus. LYMPH NODES: No palpable cervical, supraclavicular, axillary or inguinal adenopathy  NEUROLOGICAL: Unremarkable. PSYCH:  Appropriate.   No visits with results within 3 Day(s) from this visit.  Latest known visit with results is:  Appointment on 01/10/2016  Component Date Value Ref Range Status  . WBC 01/10/2016 11.0  3.6 - 11.0 K/uL Final  . RBC 01/10/2016 3.55* 3.80 - 5.20 MIL/uL Final  . Hemoglobin 01/10/2016 11.4* 12.0 - 16.0 g/dL Final  . HCT 01/10/2016 31.6* 35.0 - 47.0 % Final  . MCV 01/10/2016 89.0  80.0 - 100.0 fL Final  . MCH  01/10/2016 32.2  26.0 - 34.0 pg Final  . MCHC 01/10/2016 36.2* 32.0 - 36.0 g/dL Final  . RDW 01/10/2016 14.5  11.5 - 14.5 % Final  . Platelets 01/10/2016 203  150 - 440 K/uL Final  . Neutrophils Relative % 01/10/2016 34  % Final  . Lymphocytes Relative 01/10/2016 58  % Final  . Monocytes Relative 01/10/2016 6  % Final  . Eosinophils Relative 01/10/2016 2  % Final  . Basophils Relative 01/10/2016 0  % Final  . Neutro Abs 01/10/2016 3.7  1.4 - 6.5 K/uL Final  . Lymphs Abs 01/10/2016 6.4* 1.0 - 3.6 K/uL Final  . Monocytes Absolute 01/10/2016 0.7  0.2 - 0.9 K/uL Final  . Eosinophils Absolute 01/10/2016 0.2  0 - 0.7 K/uL Final  .  Basophils Absolute 01/10/2016 0.0  0 - 0.1 K/uL Final  . Smear Review 01/10/2016 MORPHOLOGY UNREMARKABLE   Final  . Ferritin 01/10/2016 116  11 - 307 ng/mL Final  . Vitamin B-12 01/10/2016 119* 180 - 914 pg/mL Final   Comment: (NOTE) This assay is not validated for testing neonatal or myeloproliferative syndrome specimens for Vitamin B12 levels. Performed at [facility]   . Folate 01/10/2016 44.0  >5.9 ng/mL Final  . Retic Ct Pct 01/10/2016 0.9  0.4 - 3.1 % Final  . RBC. 01/10/2016 3.55* 3.80 - 5.20 MIL/uL Final  . Retic Count, Manual 01/10/2016 32.0  19.0 - 183.0 K/uL Final  . Uric Acid, Serum 01/10/2016 4.5  2.3 - 6.6 mg/dL Final  . PATH INTERP XXX-IMP 01/13/2016 cell population, see comment.   Final   Comment: Comment Involvement by a CD5+, CD23+ clonal B   . ANNOTATION COMMENT IMP 01/13/2016 Comment   Corrected   Comment: (NOTE) If the patient has a previously well-established diagnosis of CLL/SLL, this finding represents persistent involvement by CLL/SLL.  Since a CBC is not available, the absolute number of monoclonal B cells can not be determined. If  <5,000/ul, a diagnosis of monoclonal B-cell lymphocytosis of undetermined significance should be considered in light of the patient's clinical history.   Marland Kitchen CLINICAL INFO 01/13/2016 Comment    Corrected   Comment: (NOTE) A recent CBC was not available for review at the time this report was prepared.   . Misc Source 01/13/2016 Comment   Final  . ASSESSMENT OF LEUKOCYTES 01/13/2016 Comment   Final   Comment: (NOTE) A CD5+, CD23+, CD38- monoclonal B cell population is detected with lambda light chain restriction. Polyclonal B cells are also present. There is no loss of, or aberrant expression of, the pan T cell antigens to suggest a neoplastic T cell process. CD4:CD8 ratio 3.1 A small population (8% of the T cells) of double positive (CD4+/CD8+) T cells is detected. Double positive T cells have been described in association with chronic viral infections, autoimmune disorders, chronic inflammatory disorders, and immunodeficiency states. No circulating blasts are detected. There is no immunophenotypic  evidence of abnormal myeloid maturation. Analysis of the leukocyte population shows: granulocytes 42%, monocytes 5%, lymphocytes 53%, blasts <0.5%, B cells 34%, T cells 16%, NK cells 3%.   . % Viable Cells 01/13/2016 Comment   Corrected  . Immunophenotypic Profile 01/13/2016 32% of total cells (Phenotype below)   Corrected   Comment: Comment Abnormal cell population: present   . ANALYSIS AND GATING STRATEGY 01/13/2016 Comment   Final  . IMMUNOPHENOTYPING STUDY 01/13/2016 Comment   Final   Comment: (NOTE) CD2       (-)            CD3       (-) CD4       (-)            CD5       (+) CD7       (-)            CD8       (-) CD10      (-)            CD11b     (-) CD11c     (+)            CD13      (-) CD14      (-)  CD15      (-) CD16      (-)            CD19      (+) CD20      (+) Dim        CD22      (+) Moderate CD23      (+) Bright     CD33      (-) CD34      (-)            CD38      (-) CD45      (+)            CD56      (-) CD57      (-)            CD103     (-) CD117     (-)            FMC-7     (-) HLA-DR    (+)            KAPPA     (-) LAMBDA    (+)  Dim        CD64      (-)   . PATHOLOGIST NAME 01/13/2016 Comment   Final  . COMMENT: 01/13/2016 Comment   Corrected   Comment: (NOTE) Each antibody in this assay was utilized to assess for potential abnormalities of studied cell populations or to characterize identified abnormalities. This test was developed and its performance characteristics determined by [facility].  It has not been cleared or approved by the U.S. Food and Drug Administration. The FDA has determined that such clearance or approval is not necessary. This test is used for clinical purposes.  It should not be regarded as investigational or for research. Performed At: -[facility] [address] [place], [place] [id number] Nechama Guard MD SH:[id number] Performed At: [facility] [address] 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  She presented with a distant history of anemia and recent lymphocytosis.  She was noted to have mild lymphocystosis in 06/22/2015.  Absolute lymphocyte count (ALC) has ranged between 4700 - 5500.  Work-up on 01/10/2016 revealed a hematocrit of 31.6, hemoglobin 11.4, MCV 89, platelets 203,000, WBC 11,00 with an ANC of 3700.  Absolute lymphocyte count was 6400.  Ferritin was 116.  B12 was 119 (low).  Folate was 44.0.  Retic was 0.9% (low).  Uric acid was 4.5.  Guaiac cards were negative x 3 in 12/2015.  She was diagnosed with B12 deficiency.  She began weekly B12 x 6 on 01/18/2016.  Peripheral blood flow cytometry revealed involvement by a CD5+, CD23+, CD38- monoclonal B cell population with lambda light chain restriction consistent with CLL/SLL.  There was a small population (8% of the T cells) of double positive (CD4+/CD8+) T cells (described in association with chronic viral infections, autoimmune disorders, chronic inflammatory disorders, and immunodeficiency states).   Diet is  modest.  She denies any melena or hematochezia.  Last colonoscopy was > 10 years ago.  Symptomatically, she has felt better since initiation of B12.  She has gained back a pound.  Exam reveals no palpable adenopathy or hepatosplenomegaly.  Plan: 1.  Discuss B12 deficiency.  Discuss plan to complete weekly B12 x 6 then switch to month B12. 2.  Discuss  flow cytometry confirming diagnosis of CLL.  Review conversation from last visit.  She has stage 0 disease (hemoglobin > 10 and platelets > 100,000; no adenopathy or hepatosplenomegaly).  We reviewed the indications for treatment (B symptoms, severe fatigue, progressive anemia and thrombocytopenia, bulky adenopathy, or threatened end organ function).  We discussed the plan for ongoing monitoring without intervention. 3.  B12 today 4.  RTC as scheduled for weekly B12 5.  RTC on 03/22/2016 for labs (CBC with diff) + B12 injection 6.  RTC on 04/20/2016 for MD assessment, labs (CBC with diff, CMP, LDH) + B12   Lequita Asal, MD  02/01/2016, 9:52 AM

## 2016-02-03 ENCOUNTER — Other Ambulatory Visit: Payer: Self-pay | Admitting: Family Medicine

## 2016-02-03 DIAGNOSIS — E039 Hypothyroidism, unspecified: Secondary | ICD-10-CM

## 2016-02-03 NOTE — Telephone Encounter (Signed)
Please ask daughter / patient to get patient's blood drawn this week or next; we changed her dose in June and it's time to see if this is a good dose I'll refill med as requested so she doesn't run out Thank you

## 2016-02-03 NOTE — Assessment & Plan Note (Signed)
Check TSH 

## 2016-02-08 ENCOUNTER — Encounter: Payer: Self-pay | Admitting: Family Medicine

## 2016-02-08 ENCOUNTER — Inpatient Hospital Stay: Payer: Medicare Other

## 2016-02-08 DIAGNOSIS — I10 Essential (primary) hypertension: Secondary | ICD-10-CM | POA: Diagnosis not present

## 2016-02-08 DIAGNOSIS — C9110 Chronic lymphocytic leukemia of B-cell type not having achieved remission: Secondary | ICD-10-CM | POA: Diagnosis not present

## 2016-02-08 DIAGNOSIS — E538 Deficiency of other specified B group vitamins: Secondary | ICD-10-CM

## 2016-02-08 DIAGNOSIS — E039 Hypothyroidism, unspecified: Secondary | ICD-10-CM | POA: Diagnosis not present

## 2016-02-08 DIAGNOSIS — Z79899 Other long term (current) drug therapy: Secondary | ICD-10-CM | POA: Diagnosis not present

## 2016-02-08 DIAGNOSIS — E785 Hyperlipidemia, unspecified: Secondary | ICD-10-CM | POA: Diagnosis not present

## 2016-02-08 MED ORDER — CYANOCOBALAMIN 1000 MCG/ML IJ SOLN
1000.0000 ug | Freq: Once | INTRAMUSCULAR | Status: AC
  Administered 2016-02-08: 1000 ug via INTRAMUSCULAR
  Filled 2016-02-08: qty 1

## 2016-02-15 ENCOUNTER — Encounter: Payer: Self-pay | Admitting: Family Medicine

## 2016-02-15 ENCOUNTER — Inpatient Hospital Stay: Payer: Medicare Other

## 2016-02-15 DIAGNOSIS — E785 Hyperlipidemia, unspecified: Secondary | ICD-10-CM | POA: Diagnosis not present

## 2016-02-15 DIAGNOSIS — E039 Hypothyroidism, unspecified: Secondary | ICD-10-CM | POA: Diagnosis not present

## 2016-02-15 DIAGNOSIS — Z79899 Other long term (current) drug therapy: Secondary | ICD-10-CM | POA: Diagnosis not present

## 2016-02-15 DIAGNOSIS — C9110 Chronic lymphocytic leukemia of B-cell type not having achieved remission: Secondary | ICD-10-CM | POA: Diagnosis not present

## 2016-02-15 DIAGNOSIS — E538 Deficiency of other specified B group vitamins: Secondary | ICD-10-CM | POA: Diagnosis not present

## 2016-02-15 DIAGNOSIS — I10 Essential (primary) hypertension: Secondary | ICD-10-CM | POA: Diagnosis not present

## 2016-02-15 MED ORDER — CYANOCOBALAMIN 1000 MCG/ML IJ SOLN
1000.0000 ug | Freq: Once | INTRAMUSCULAR | Status: AC
  Administered 2016-02-15: 1000 ug via INTRAMUSCULAR
  Filled 2016-02-15: qty 1

## 2016-02-18 ENCOUNTER — Ambulatory Visit (HOSPITAL_COMMUNITY): Payer: Medicare Other | Admitting: Hematology & Oncology

## 2016-02-22 ENCOUNTER — Inpatient Hospital Stay: Payer: Medicare Other

## 2016-02-22 DIAGNOSIS — E039 Hypothyroidism, unspecified: Secondary | ICD-10-CM | POA: Diagnosis not present

## 2016-02-22 DIAGNOSIS — Z79899 Other long term (current) drug therapy: Secondary | ICD-10-CM | POA: Diagnosis not present

## 2016-02-22 DIAGNOSIS — E538 Deficiency of other specified B group vitamins: Secondary | ICD-10-CM | POA: Diagnosis not present

## 2016-02-22 DIAGNOSIS — C9110 Chronic lymphocytic leukemia of B-cell type not having achieved remission: Secondary | ICD-10-CM | POA: Diagnosis not present

## 2016-02-22 DIAGNOSIS — E785 Hyperlipidemia, unspecified: Secondary | ICD-10-CM | POA: Diagnosis not present

## 2016-02-22 DIAGNOSIS — I10 Essential (primary) hypertension: Secondary | ICD-10-CM | POA: Diagnosis not present

## 2016-02-22 MED ORDER — CYANOCOBALAMIN 1000 MCG/ML IJ SOLN
1000.0000 ug | Freq: Once | INTRAMUSCULAR | Status: AC
  Administered 2016-02-22: 1000 ug via INTRAMUSCULAR
  Filled 2016-02-22: qty 1

## 2016-02-24 ENCOUNTER — Encounter: Payer: Self-pay | Admitting: Family Medicine

## 2016-02-25 ENCOUNTER — Telehealth: Payer: Self-pay | Admitting: Family Medicine

## 2016-02-25 ENCOUNTER — Ambulatory Visit (INDEPENDENT_AMBULATORY_CARE_PROVIDER_SITE_OTHER): Payer: Medicare Other | Admitting: Family Medicine

## 2016-02-25 ENCOUNTER — Encounter: Payer: Self-pay | Admitting: Family Medicine

## 2016-02-25 VITALS — BP 116/68 | HR 53 | Temp 98.6°F | Resp 16 | Wt 135.0 lb

## 2016-02-25 DIAGNOSIS — R339 Retention of urine, unspecified: Secondary | ICD-10-CM | POA: Diagnosis not present

## 2016-02-25 DIAGNOSIS — N8110 Cystocele, unspecified: Secondary | ICD-10-CM | POA: Insufficient documentation

## 2016-02-25 DIAGNOSIS — N3000 Acute cystitis without hematuria: Secondary | ICD-10-CM

## 2016-02-25 DIAGNOSIS — I6523 Occlusion and stenosis of bilateral carotid arteries: Secondary | ICD-10-CM

## 2016-02-25 DIAGNOSIS — N390 Urinary tract infection, site not specified: Secondary | ICD-10-CM | POA: Insufficient documentation

## 2016-02-25 DIAGNOSIS — R8271 Bacteriuria: Secondary | ICD-10-CM | POA: Insufficient documentation

## 2016-02-25 LAB — POCT URINALYSIS DIPSTICK
BILIRUBIN UA: NEGATIVE
Blood, UA: NEGATIVE
GLUCOSE UA: NEGATIVE
KETONES UA: NEGATIVE
Nitrite, UA: NEGATIVE
Protein, UA: NEGATIVE
Spec Grav, UA: 1.025
Urobilinogen, UA: 0.2
pH, UA: 5.5

## 2016-02-25 MED ORDER — DOXYCYCLINE HYCLATE 100 MG PO TABS: 100.0000 mg | ORAL_TABLET | Freq: Two times a day (BID) | ORAL | 0 refills | Status: AC

## 2016-02-25 NOTE — Telephone Encounter (Signed)
Thank you I decided on urology since she has had a hysterectomy, but that is helpful for future referrals too I'll put in the gyn referral if you can cancel the urology referral Thank you

## 2016-02-25 NOTE — Assessment & Plan Note (Signed)
Discussed pessary; I think she would be a good candidate; refer to urologist

## 2016-02-25 NOTE — Progress Notes (Signed)
BP 116/68   Pulse (!) 53   Temp 98.6 F (37 C) (Oral)   Resp 16   Wt 135 lb (61.2 kg)   LMP  (LMP Unknown)   SpO2 96%   BMI 24.53 kg/m    Subjective:    Patient ID: Sydney Mcdaniel, female    DOB: [birth year], 82 y.o.   MRN: [id number]  HPI: Sydney Mcdaniel is a 82 y.o. female  Chief Complaint  Patient presents with  . Nocturia    not urinating but very few during day, and up to 4 x at night   She is here with her daughter for urinary concerns; see MyChart note She urinates at night a lot, but not much in the daytime When daughter came home yesterday, patient was in tears Her bladder is down and daughter looked; her bladder is right there at the vaginal opening No fevers No strong smell to the urine No flank pain No pressure down below in the suprapubic region Had diarrhea on Saturday, better on Sunday; that is all better, just wanted me to know  Depression screen [facility] 2/9 02/25/2016 12/21/2015 06/22/2015 12/22/2014  Decreased Interest 0 0 0 3  Down, Depressed, Hopeless 0 0 1 3  PHQ - 2 Score 0 0 1 6  Altered sleeping - - - 3  Tired, decreased energy - - - 2  Change in appetite - - - 1  Feeling bad or failure about yourself  - - - 0  Trouble concentrating - - - 3  Moving slowly or fidgety/restless - - - 0  Suicidal thoughts - - - 0  PHQ-9 Score - - - 15   Relevant past medical, surgical, family and social history reviewed Past Medical History:  Diagnosis Date  . Allergy   . Anemia   . Cataract   . Glaucoma   . H/O: hysterectomy    Total  . Hyperlipidemia   . Hypertension   . Hypothyroidism   . Impaired fasting glucose   . Lichen sclerosus   . Osteoporosis    Hips   Past Surgical History:  Procedure Laterality Date  . APPENDECTOMY    . EYE SURGERY     Glaucoma  . TOTAL ABDOMINAL HYSTERECTOMY     Social History  Substance Use Topics  . Smoking status: Never Smoker  . Smokeless tobacco: Never Used  . Alcohol use No   Interim medical history since  last visit reviewed. Allergies and medications reviewed  Review of Systems Per HPI unless specifically indicated above     Objective:    BP 116/68   Pulse (!) 53   Temp 98.6 F (37 C) (Oral)   Resp 16   Wt 135 lb (61.2 kg)   LMP  (LMP Unknown)   SpO2 96%   BMI 24.53 kg/m   Wt Readings from Last 3 Encounters:  02/25/16 135 lb (61.2 kg)  02/01/16 134 lb 7.7 oz (61 kg)  01/10/16 133 lb 6.1 oz (60.5 kg)    Physical Exam  Constitutional: She appears well-developed and well-nourished. No distress.  Pulmonary/Chest: Effort normal.  Abdominal: Soft. Bowel sounds are normal. She exhibits no distension and no mass. There is no tenderness.  Genitourinary: No erythema in the vagina.  Genitourinary Comments: Large rectocoele, grade 3; easily reducible; no discharge  Psychiatric:  Briefly tearful when discussing her worries    Results for orders placed or performed in visit on 02/25/16  POCT urinalysis dipstick  Result Value Ref  Range   Color, UA light yellow    Clarity, UA clear    Glucose, UA neg    Bilirubin, UA neg    Ketones, UA neg    Spec Grav, UA 1.025    Blood, UA neg    pH, UA 5.5    Protein, UA neg    Urobilinogen, UA 0.2    Nitrite, UA neg    Leukocytes, UA moderate (2+) (A) Negative      Assessment & Plan:   Problem List Items Addressed This Visit      Genitourinary   Urinary tract infection - Primary    Start antibiotics, culture pending      Relevant Orders   Urine Culture   Bladder prolapse, female, acquired    Discussed pessary; I think she would be a good candidate; refer to urologist      Relevant Orders   Ambulatory referral to Urology    Other Visit Diagnoses    Urinary retention       Relevant Orders   POCT urinalysis dipstick (Completed)      Follow up plan: No Follow-up on file.  An after-visit summary was printed and given to the patient at [facility].  Please see the patient instructions which may contain other information and  recommendations beyond what is mentioned above in the assessment and plan.  Meds ordered this encounter  Medications  . doxycycline (VIBRA-TABS) 100 MG tablet    Sig: Take 1 tablet (100 mg total) by mouth 2 (two) times daily.    Dispense:  14 tablet    Refill:  0    Orders Placed This Encounter  Procedures  . Urine Culture  . Ambulatory referral to Urology  . POCT urinalysis dipstick

## 2016-02-25 NOTE — Patient Instructions (Addendum)
Start the new antibiotic Please do eat yogurt daily or take a probiotic daily for the next month or two We want to replace the healthy germs in the gut If you notice foul, watery diarrhea in the next two months, schedule an appointment RIGHT AWAY I've put in a referral to a urologist about your bladder prolapse and they can consider if a pessary is right for you We'll get a culture of your urine  Pelvic Organ Prolapse Pelvic organ prolapse is the stretching, bulging, or dropping of pelvic organs into an abnormal position. It happens when the muscles and tissues that surround and support pelvic structures are stretched or weak. Pelvic organ prolapse can involve:  Vagina (vaginal prolapse).  Uterus (uterine prolapse).  Bladder (cystocele).  Rectum (rectocele).  Intestines (enterocele). When organs other than the vagina are involved, they often bulge into the vagina or protrude from the vagina, depending on how severe the prolapse is. CAUSES Causes of this condition include:  Pregnancy, labor, and childbirth.  Long-lasting (chronic) cough.  Chronic constipation.  Obesity.  Past pelvic surgery.  Aging. During and after menopause, a decreased production of the hormone estrogen can weaken pelvic ligaments and muscles.  Consistently lifting more than 50 lb (23 kg).  Buildup of fluid in the abdomen due to certain diseases and other conditions. SYMPTOMS Symptoms of this condition include:  Loss of bladder control when you cough, sneeze, strain, and exercise (stress incontinence). This may be worse immediately following childbirth, and it may gradually improve over time.  Feeling pressure in your pelvis or vagina. This pressure may increase when you cough or when you are having a bowel movement.  A bulge that protrudes from the opening of your vagina or against your vaginal wall. If your uterus protrudes through the opening of your vagina and rubs against your clothing, you may  also experience soreness, ulcers, infection, pain, and bleeding.  Increased effort to have a bowel movement or urinate.  Pain in your low back.  Pain, discomfort, or disinterest in sexual intercourse.  Repeated bladder infections (urinary tract infections).  Difficulty inserting or inability to insert a tampon or applicator. In some people, this condition does not cause any symptoms. DIAGNOSIS Your health care provider may perform an internal and external vaginal and rectal exam. During the exam, you may be asked to cough and strain while you are lying down, sitting, and standing up. Your health care provider will determine if other tests are required, such as bladder function tests. TREATMENT In most cases, this condition needs to be treated only if it produces symptoms. No treatment is guaranteed to correct the prolapse or relieve the symptoms completely. Treatment may include:  Lifestyle changes, such as:  Avoiding drinking beverages that contain caffeine.  Increasing your intake of high-fiber foods. This can help to decrease constipation and straining during bowel movements.  Emptying your bladder at scheduled times (bladder training therapy). This can help to reduce or avoid urinary incontinence.  Losing weight if you are overweight or obese.  Estrogen. Estrogen may help mild prolapse by increasing the strength and tone of pelvic floor muscles.  Kegel exercises. These may help mild cases of prolapse by strengthening and tightening the muscles of the pelvic floor.  Pessary insertion. A pessary is a soft, flexible device that is placed into your vagina by your health care provider to help support the vaginal walls and keep pelvic organs in place.  Surgery. This is often the only form of treatment for severe  prolapse. Different types of surgeries are available. HOME CARE INSTRUCTIONS  Wear a sanitary pad or absorbent product if you have urinary incontinence.  Avoid heavy  lifting and straining with exercise and work. Do not hold your breath when you perform mild to moderate lifting and exercise activities. Limit your activities as directed by your health care provider.  Take medicines only as directed by your health care provider.  Perform Kegel exercises as directed by your health care provider.  If you have a pessary, take care of it as directed by your health care provider. SEEK MEDICAL CARE IF:  Your symptoms interfere with your daily activities or sex life.  You need medicine to help with the discomfort.  You notice bleeding from the vagina that is not related to your period.  You have a fever.  You have pain or bleeding when you urinate.  You have bleeding when you have a bowel movement.  You lose urine when you have sex.  You have chronic constipation.  You have a pessary that falls out.  You have vaginal discharge that has a bad smell.  You have low abdominal pain or cramping that is unusual for you.   This information is not intended to replace advice given to you by your health care provider. Make sure you discuss any questions you have with your health care provider.   Document Released: 01/07/2014 Document Reviewed: 01/07/2014 Elsevier Interactive Patient Education [employer].

## 2016-02-25 NOTE — Assessment & Plan Note (Signed)
Start antibiotics, culture pending

## 2016-02-27 LAB — URINE CULTURE: Organism ID, Bacteria: 10000

## 2016-03-21 ENCOUNTER — Other Ambulatory Visit: Payer: Self-pay | Admitting: Hematology and Oncology

## 2016-03-22 ENCOUNTER — Inpatient Hospital Stay: Payer: Medicare Other

## 2016-03-22 ENCOUNTER — Encounter: Payer: Self-pay | Admitting: Family Medicine

## 2016-03-22 ENCOUNTER — Inpatient Hospital Stay: Payer: Medicare Other | Attending: Hematology and Oncology

## 2016-03-22 ENCOUNTER — Ambulatory Visit (INDEPENDENT_AMBULATORY_CARE_PROVIDER_SITE_OTHER): Payer: Medicare Other | Admitting: Family Medicine

## 2016-03-22 ENCOUNTER — Other Ambulatory Visit: Payer: Self-pay

## 2016-03-22 VITALS — BP 118/64 | HR 52 | Temp 98.4°F | Resp 18 | Ht 62.0 in | Wt 133.3 lb

## 2016-03-22 DIAGNOSIS — R001 Bradycardia, unspecified: Secondary | ICD-10-CM

## 2016-03-22 DIAGNOSIS — I6523 Occlusion and stenosis of bilateral carotid arteries: Secondary | ICD-10-CM | POA: Diagnosis not present

## 2016-03-22 DIAGNOSIS — Z79899 Other long term (current) drug therapy: Secondary | ICD-10-CM | POA: Diagnosis not present

## 2016-03-22 DIAGNOSIS — Z90710 Acquired absence of both cervix and uterus: Secondary | ICD-10-CM | POA: Insufficient documentation

## 2016-03-22 DIAGNOSIS — I10 Essential (primary) hypertension: Secondary | ICD-10-CM | POA: Diagnosis not present

## 2016-03-22 DIAGNOSIS — E538 Deficiency of other specified B group vitamins: Secondary | ICD-10-CM | POA: Insufficient documentation

## 2016-03-22 DIAGNOSIS — Z7982 Long term (current) use of aspirin: Secondary | ICD-10-CM | POA: Diagnosis not present

## 2016-03-22 DIAGNOSIS — E785 Hyperlipidemia, unspecified: Secondary | ICD-10-CM | POA: Insufficient documentation

## 2016-03-22 DIAGNOSIS — C9110 Chronic lymphocytic leukemia of B-cell type not having achieved remission: Secondary | ICD-10-CM | POA: Diagnosis not present

## 2016-03-22 DIAGNOSIS — E039 Hypothyroidism, unspecified: Secondary | ICD-10-CM

## 2016-03-22 DIAGNOSIS — R55 Syncope and collapse: Secondary | ICD-10-CM

## 2016-03-22 DIAGNOSIS — R7301 Impaired fasting glucose: Secondary | ICD-10-CM | POA: Diagnosis not present

## 2016-03-22 DIAGNOSIS — Z23 Encounter for immunization: Secondary | ICD-10-CM

## 2016-03-22 DIAGNOSIS — D649 Anemia, unspecified: Secondary | ICD-10-CM

## 2016-03-22 LAB — CBC WITH DIFFERENTIAL/PLATELET
Basophils Absolute: 0.1 10*3/uL (ref 0–0.1)
Basophils Relative: 1 %
Eosinophils Absolute: 0.5 10*3/uL (ref 0–0.7)
Eosinophils Relative: 6 %
HCT: 32.4 % — ABNORMAL LOW (ref 35.0–47.0)
Hemoglobin: 11.3 g/dL — ABNORMAL LOW (ref 12.0–16.0)
Lymphocytes Relative: 51 %
Lymphs Abs: 4.3 10*3/uL — ABNORMAL HIGH (ref 1.0–3.6)
MCH: 31.6 pg (ref 26.0–34.0)
MCHC: 34.8 g/dL (ref 32.0–36.0)
MCV: 90.9 fL (ref 80.0–100.0)
Monocytes Absolute: 0.6 10*3/uL (ref 0.2–0.9)
Monocytes Relative: 7 %
Neutro Abs: 3 10*3/uL (ref 1.4–6.5)
Neutrophils Relative %: 35 %
Platelets: 219 10*3/uL (ref 150–440)
RBC: 3.57 MIL/uL — ABNORMAL LOW (ref 3.80–5.20)
RDW: 13.9 % (ref 11.5–14.5)
WBC: 8.4 10*3/uL (ref 3.6–11.0)

## 2016-03-22 LAB — TSH: TSH: 5.28 m[IU]/L — AB

## 2016-03-22 MED ORDER — CYANOCOBALAMIN 1000 MCG/ML IJ SOLN
1000.0000 ug | Freq: Once | INTRAMUSCULAR | Status: AC
  Administered 2016-03-22: 1000 ug via INTRAMUSCULAR
  Filled 2016-03-22: qty 1

## 2016-03-22 MED ORDER — LEVOTHYROXINE SODIUM 75 MCG PO TABS: 75.0000 ug | ORAL_TABLET | Freq: Every day | ORAL | 1 refills | Status: DC

## 2016-03-22 MED ORDER — GLUCOSE BLOOD VI STRP: ORAL_STRIP | 12 refills | Status: DC

## 2016-03-22 MED ORDER — LISINOPRIL 10 MG PO TABS: 10.0000 mg | ORAL_TABLET | Freq: Every day | ORAL | 3 refills | Status: DC

## 2016-03-22 MED ORDER — ACCU-CHEK SOFTCLIX LANCETS MISC: 12 refills | Status: DC

## 2016-03-22 NOTE — Assessment & Plan Note (Signed)
accucheck aviva plus

## 2016-03-22 NOTE — Patient Instructions (Addendum)
Stop, prop, and flop Wrist cuff should be about the level of your heart Have thyroid checked today with other labs STOP the amlodipine completely Continue the lisinopril but take only 10 mg daily If your pressures start to climb up above 150, then we'll increase your lisinopril again, just let me know Stay hydrated We'll have you see the cardiologist If you have another syncopal episode, please get checked out right away

## 2016-03-22 NOTE — Progress Notes (Signed)
BP 118/64 (BP Location: Left Arm, Patient Position: Sitting, Cuff Size: Large)   Pulse (!) 52   Temp 98.4 F (36.9 C) (Oral)   Resp 18   Ht 5\' 2"  (1.575 m)   Wt 133 lb 5 oz (60.5 kg)   LMP  (LMP Unknown)   SpO2 97%   BMI 24.38 kg/m    Subjective:    Patient ID: Sydney Mcdaniel, female    DOB: 03-01-1925, 82 y.o.   MRN: [id number]  HPI: Sydney Mcdaniel is a 82 y.o. female  Chief Complaint  Patient presents with  . Hypertension  . Follow-up    Patient passed out walking to the mailbox, blood sugar was little high. 03/02/2016   She was walking to the mailbox and picked up some roots out of the yard; went back in the house; wanted something cold to drink, diet [facility]; put the top back on the bottle and then hit the ground, hit her bottom; did not hit head; woke up and heard her alarm went off; fall detection, they called daughter and she had gotten up to work the system on the wall where the alarm was; felt hot and sweaty; this happened Sept 7th; blood sugar was 167 It was a hot day; air conditioning in the house; got sweaty; no fatigue after event; no chest pain; no palpitations, no funny heart rate She has been keeping record of the BPs; but did not check that day She checks her FSBS for her hyperglycemia  Depression screen [facility] 2/9 03/22/2016 02/25/2016 12/21/2015 06/22/2015 12/22/2014  Decreased Interest 0 0 0 0 3  Down, Depressed, Hopeless 0 0 0 1 3  PHQ - 2 Score 0 0 0 1 6  Altered sleeping - - - - 3  Tired, decreased energy - - - - 2  Change in appetite - - - - 1  Feeling bad or failure about yourself  - - - - 0  Trouble concentrating - - - - 3  Moving slowly or fidgety/restless - - - - 0  Suicidal thoughts - - - - 0  PHQ-9 Score - - - - 15   Relevant past medical, surgical, family and social history reviewed Past Medical History:  Diagnosis Date  . Allergy   . Anemia   . Cataract   . Glaucoma   . H/O: hysterectomy    Total  . Hyperlipidemia   . Hypertension     . Hypothyroidism   . Impaired fasting glucose   . Lichen sclerosus   . Osteoporosis    Hips   Past Surgical History:  Procedure Laterality Date  . APPENDECTOMY    . EYE SURGERY     Glaucoma  . TOTAL ABDOMINAL HYSTERECTOMY     Family History  Problem Relation Age of Onset  . Heart attack Mother   . Glaucoma Mother   . Heart attack Father   . Parkinson's disease Brother   . Heart attack Brother    Social History  Substance Use Topics  . Smoking status: Never Smoker  . Smokeless tobacco: Never Used  . Alcohol use No   Interim medical history since last visit reviewed. Allergies and medications reviewed  Review of Systems Per HPI unless specifically indicated above     Objective:    BP 118/64 (BP Location: Left Arm, Patient Position: Sitting, Cuff Size: Large)   Pulse (!) 52   Temp 98.4 F (36.9 C) (Oral)   Resp 18   Ht  5\' 2"  (1.575 m)   Wt 133 lb 5 oz (60.5 kg)   LMP  (LMP Unknown)   SpO2 97%   BMI 24.38 kg/m   Wt Readings from Last 3 Encounters:  03/22/16 133 lb 5 oz (60.5 kg)  02/25/16 135 lb (61.2 kg)  02/01/16 134 lb 7.7 oz (61 kg)    Physical Exam  Constitutional: She appears well-developed and well-nourished. No distress.  HENT:  Head: Normocephalic and atraumatic.  Eyes: EOM are normal. No scleral icterus.  Neck: Carotid bruit is not present. No thyromegaly present.  Cardiovascular: Regular rhythm and normal heart sounds.  Bradycardia present.   No murmur heard. Pulmonary/Chest: Effort normal and breath sounds normal. No respiratory distress. She has no wheezes.  Abdominal: Soft. Bowel sounds are normal. She exhibits no distension.  Musculoskeletal: Normal range of motion. She exhibits no edema.  Neurological: She is alert. She displays no atrophy and no tremor. No cranial nerve deficit. She exhibits normal muscle tone. Coordination and gait normal.  Skin: Skin is warm and dry. She is not diaphoretic. No pallor.  Psychiatric: She has a normal  mood and affect. Her behavior is normal. Judgment and thought content normal. Her mood appears not anxious. She does not exhibit a depressed mood.      Assessment & Plan:   Problem List Items Addressed This Visit      Cardiovascular and Mediastinum   Syncope - Primary    May have been hypotension plus bradycardia; refer to cardiologist; decrease antihypertension; stay hydrated; carotid scan UTD      Relevant Medications   lisinopril (PRINIVIL,ZESTRIL) 10 MG tablet   Other Relevant Orders   Ambulatory referral to Cardiology   Essential hypertension, benign    Stop CCB; decrease ACE-I; hydration encouraged; discussed appropriate use of wrist cuff; prop the arm, rest against chest or on solid surface at heart level; relax prior to checking; monitor and let me know if trending up or down excessively      Relevant Medications   lisinopril (PRINIVIL,ZESTRIL) 10 MG tablet     Endocrine   Impaired fasting glucose    accucheck aviva plus      Relevant Medications   glucose blood (ACCU-CHEK AVIVA PLUS) test strip   ACCU-CHEK SOFTCLIX LANCETS lancets   Hypothyroidism     Other   Bradycardia    Check TSH; refer to cardiologist      Relevant Orders   Ambulatory referral to Cardiology    Other Visit Diagnoses    Needs flu shot       Relevant Orders   Flu vaccine HIGH DOSE PF (Fluzone High dose) (Completed)      Follow up plan: Return in about 3 weeks (around 04/12/2016).  An after-visit summary was printed and given to the patient at [facility].  Please see the patient instructions which may contain other information and recommendations beyond what is mentioned above in the assessment and plan.  Meds ordered this encounter  Medications  . lisinopril (PRINIVIL,ZESTRIL) 10 MG tablet    Sig: Take 1 tablet (10 mg total) by mouth daily.    Dispense:  90 tablet    Refill:  3    We're lowering the dose  . DISCONTD: levothyroxine (SYNTHROID, LEVOTHROID) 75 MCG tablet    Sig: Take  1 tablet (75 mcg total) by mouth daily.    Dispense:  90 tablet    Refill:  1  . glucose blood (ACCU-CHEK AVIVA PLUS) test strip    Sig:  Use as instructed, check once a day    Dispense:  100 each    Refill:  12  . ACCU-CHEK SOFTCLIX LANCETS lancets    Sig: Use as instructed; check once a day    Dispense:  100 each    Refill:  12    Orders Placed This Encounter  Procedures  . Flu vaccine HIGH DOSE PF (Fluzone High dose)  . Ambulatory referral to Cardiology   Face-to-face time with patient was more than 25 minutes, >50% time spent counseling and coordination of care

## 2016-03-22 NOTE — Assessment & Plan Note (Signed)
Check TSH; refer to cardiologist

## 2016-03-22 NOTE — Assessment & Plan Note (Signed)
May have been hypotension plus bradycardia; refer to cardiologist; decrease antihypertension; stay hydrated; carotid scan UTD

## 2016-03-23 ENCOUNTER — Other Ambulatory Visit: Payer: Self-pay | Admitting: Family Medicine

## 2016-03-23 DIAGNOSIS — E039 Hypothyroidism, unspecified: Secondary | ICD-10-CM

## 2016-03-23 MED ORDER — LEVOTHYROXINE SODIUM 88 MCG PO TABS: ORAL_TABLET | ORAL | 1 refills | Status: DC

## 2016-03-23 MED ORDER — LEVOTHYROXINE SODIUM 75 MCG PO TABS: ORAL_TABLET | ORAL | 1 refills | Status: DC

## 2016-03-23 NOTE — Assessment & Plan Note (Signed)
Adjust dose and recheck TSH in 8 weeks

## 2016-03-23 NOTE — Progress Notes (Signed)
Adjust dose, recheck TSH in 8 weeks

## 2016-03-25 NOTE — Assessment & Plan Note (Addendum)
Stop CCB; decrease ACE-I; hydration encouraged; discussed appropriate use of wrist cuff; prop the arm, rest against chest or on solid surface at heart level; relax prior to checking; monitor and let me know if trending up or down excessively

## 2016-03-26 ENCOUNTER — Encounter: Payer: Self-pay | Admitting: Hematology and Oncology

## 2016-03-27 ENCOUNTER — Ambulatory Visit: Payer: Medicare Other | Admitting: Cardiovascular Disease

## 2016-03-27 ENCOUNTER — Telehealth: Payer: Self-pay

## 2016-03-27 DIAGNOSIS — R7301 Impaired fasting glucose: Secondary | ICD-10-CM

## 2016-03-27 DIAGNOSIS — N8184 Pelvic muscle wasting: Secondary | ICD-10-CM | POA: Diagnosis not present

## 2016-03-27 DIAGNOSIS — N8111 Cystocele, midline: Secondary | ICD-10-CM | POA: Diagnosis not present

## 2016-03-27 MED ORDER — ACCU-CHEK SOFTCLIX LANCETS MISC: 12 refills | Status: DC

## 2016-03-27 MED ORDER — GLUCOSE BLOOD VI STRP: ORAL_STRIP | 12 refills | Status: DC

## 2016-03-27 NOTE — Telephone Encounter (Signed)
Please provide dx code on rx

## 2016-03-29 ENCOUNTER — Encounter: Payer: Self-pay | Admitting: Cardiology

## 2016-03-29 ENCOUNTER — Telehealth: Payer: Self-pay

## 2016-03-29 ENCOUNTER — Ambulatory Visit (INDEPENDENT_AMBULATORY_CARE_PROVIDER_SITE_OTHER): Payer: Medicare Other | Admitting: Cardiology

## 2016-03-29 VITALS — BP 170/66 | HR 71 | Ht 62.0 in | Wt 134.1 lb

## 2016-03-29 DIAGNOSIS — E7849 Other hyperlipidemia: Secondary | ICD-10-CM

## 2016-03-29 DIAGNOSIS — E784 Other hyperlipidemia: Secondary | ICD-10-CM

## 2016-03-29 DIAGNOSIS — R55 Syncope and collapse: Secondary | ICD-10-CM | POA: Diagnosis not present

## 2016-03-29 DIAGNOSIS — R9431 Abnormal electrocardiogram [ECG] [EKG]: Secondary | ICD-10-CM

## 2016-03-29 DIAGNOSIS — N8184 Pelvic muscle wasting: Secondary | ICD-10-CM | POA: Diagnosis not present

## 2016-03-29 DIAGNOSIS — I10 Essential (primary) hypertension: Secondary | ICD-10-CM | POA: Diagnosis not present

## 2016-03-29 DIAGNOSIS — I6523 Occlusion and stenosis of bilateral carotid arteries: Secondary | ICD-10-CM

## 2016-03-29 DIAGNOSIS — N8111 Cystocele, midline: Secondary | ICD-10-CM | POA: Diagnosis not present

## 2016-03-29 MED ORDER — GLUCOSE BLOOD VI STRP: ORAL_STRIP | 12 refills | Status: DC

## 2016-03-29 MED ORDER — ACCU-CHEK SOFTCLIX LANCETS MISC: 12 refills | Status: DC

## 2016-03-29 MED ORDER — AMLODIPINE BESYLATE 5 MG PO TABS: 5.0000 mg | ORAL_TABLET | Freq: Every day | ORAL | 1 refills | Status: DC

## 2016-03-29 NOTE — Patient Instructions (Addendum)
Testing/Procedures: Your physician has requested that you have an echocardiogram. Echocardiography is a painless test that uses sound waves to create images of your heart. It provides your doctor with information about the size and shape of your heart and how well your heart's chambers and valves are working. This procedure takes approximately one hour. There are no restrictions for this procedure.  Your physician has recommended that you wear a holter monitor. Holter monitors are medical devices that record the heart's electrical activity. Doctors most often use these monitors to diagnose arrhythmias. Arrhythmias are problems with the speed or rhythm of the heartbeat. The monitor is a small, portable device. You can wear one while you do your normal daily activities. This is usually used to diagnose what is causing palpitations/syncope (passing out).  [place]  Your caregiver has ordered a Stress Test with nuclear imaging. The purpose of this test is to evaluate the blood supply to your heart muscle. This procedure is referred to as a "Non-Invasive Stress Test." This is because other than having an IV started in your vein, nothing is inserted or "invades" your body. Cardiac stress tests are done to find areas of poor blood flow to the heart by determining the extent of coronary artery disease (CAD). Some patients exercise on a treadmill, which naturally increases the blood flow to your heart, while others who are  unable to walk on a treadmill due to physical limitations have a pharmacologic/chemical stress agent called Lexiscan . This medicine will mimic walking on a treadmill by temporarily increasing your coronary blood flow.   Please note: these test may take anywhere between 2-4 hours to complete  PLEASE REPORT TO [facility] AT THE FIRST DESK WILL DIRECT YOU WHERE TO GO  Date of Procedure:_Monday April 10, 2016 at 08:00AM_  Arrival Time for  Procedure:__Arrive at 07:45AM to register___   PLEASE NOTIFY THE OFFICE AT LEAST 24 HOURS IN ADVANCE IF YOU ARE UNABLE TO [place].  [phone] AND  PLEASE NOTIFY NUCLEAR MEDICINE AT [facility] AT LEAST 24 HOURS IN ADVANCE IF YOU ARE UNABLE TO KEEP YOUR APPOINTMENT. [phone]  How to prepare for your Myoview test:  1. Do not eat or drink after midnight 2. No caffeine for 24 hours prior to test 3. No smoking 24 hours prior to test. 4. Your medication may be taken with water.  If your doctor stopped a medication because of this test, do not take that medication. 5. Ladies, please do not wear dresses.  Skirts or pants are appropriate. Please wear a short sleeve shirt. 6. No perfume, cologne or lotion. 7. Wear comfortable walking shoes. No heels!   Follow-Up: Your physician recommends that you schedule a follow-up appointment after testing with Dr. Yvone Neu.  It was a pleasure seeing you today here in the office. Please do not hesitate to give [place] a call back if you have any further questions. [place], BSN     Echocardiogram An echocardiogram, or echocardiography, uses sound waves (ultrasound) to produce an image of your heart. The echocardiogram is simple, painless, obtained within a short period of time, and offers valuable information to your health care provider. The images from an echocardiogram can provide information such as:  Evidence of coronary artery disease (CAD).  Heart size.  Heart muscle function.  Heart valve function.  Aneurysm detection.  Evidence of a past heart attack.  Fluid buildup around the heart.  Heart muscle thickening.  Assess  heart valve function. LET [facility] CARE PROVIDER KNOW ABOUT:  Any allergies you have.  All medicines you are taking, including vitamins, herbs, eye drops, creams, and over-the-counter medicines.  Previous problems you or members of your family have had with the use of anesthetics.  Any  blood disorders you have.  Previous surgeries you have had.  Medical conditions you have.  Possibility of pregnancy, if this applies. BEFORE THE PROCEDURE  No special preparation is needed. Eat and drink normally.  PROCEDURE   In order to produce an image of your heart, gel will be applied to your chest and a wand-like tool (transducer) will be moved over your chest. The gel will help transmit the sound waves from the transducer. The sound waves will harmlessly bounce off your heart to allow the heart images to be captured in real-time motion. These images will then be recorded.  You may need an IV to receive a medicine that improves the quality of the pictures. AFTER THE PROCEDURE You may return to your normal schedule including diet, activities, and medicines, unless your health care provider tells you otherwise.   This information is not intended to replace advice given to you by your health care provider. Make sure you discuss any questions you have with your health care provider.   Document Released: 06/09/2000 Document Revised: 07/03/2014 Document Reviewed: 02/17/2013 Elsevier Interactive Patient Education 2016 Elsevier Inc[employer]     Holter Monitoring A Holter monitor is a small device that is used to detect abnormal heart rhythms. It clips to your clothing and is connected by wires to flat, sticky disks (electrodes) that attach to your chest. It is worn continuously for 24-48 hours. HOME CARE INSTRUCTIONS  Wear your Holter monitor at all times, even while exercising and sleeping, for as long as directed by your health care provider.  Make sure that the Holter monitor is safely clipped to your clothing or close to your body as recommended by your health care provider.  Do not get the monitor or wires wet.  Do not put body lotion or moisturizer on your chest.  Keep your skin clean.  Keep a diary of your daily activities, such as walking and doing chores. If you feel that your  heartbeat is abnormal or that your heart is fluttering or skipping a beat:  Record what you are doing when it happens.  Record what time of day the symptoms occur.  Return your Holter monitor as directed by your health care provider.  Keep all follow-up visits as directed by your health care provider. This is important. SEEK IMMEDIATE MEDICAL CARE IF:  You feel lightheaded or you faint.  You have trouble breathing.  You feel pain in your chest, upper arm, or jaw.  You feel sick to your stomach and your skin is pale, cool, or damp.  You heartbeat feels unusual or abnormal.   This information is not intended to replace advice given to you by your health care provider. Make sure you discuss any questions you have with your health care provider.   Document Released: 03/10/2004 Document Revised: 07/03/2014 Document Reviewed: 01/19/2014 Elsevier Interactive Patient Education 2016 [place].    Pharmacologic Stress Electrocardiogram A pharmacologic stress electrocardiogram is a heart (cardiac) test that uses nuclear imaging to evaluate the blood supply to your heart. This test may also be called a pharmacologic stress electrocardiography. Pharmacologic means that a medicine is used to increase your heart rate and blood pressure.  This stress test is done  to find areas of poor blood flow to the heart by determining the extent of coronary artery disease (CAD). Some people exercise on a treadmill, which naturally increases the blood flow to the heart. For those people unable to exercise on a treadmill, a medicine is used. This medicine stimulates your heart and will cause your heart to beat harder and more quickly, as if you were exercising.  Pharmacologic stress tests can help determine:  The adequacy of blood flow to your heart during increased levels of activity in order to clear you for discharge home.  The extent of coronary artery blockage caused by CAD.  Your prognosis if you  have suffered a heart attack.  The effectiveness of cardiac procedures done, such as an angioplasty, which can increase the circulation in your coronary arteries.  Causes of chest pain or pressure. LET [facility] CARE PROVIDER KNOW ABOUT:  Any allergies you have.  All medicines you are taking, including vitamins, herbs, eye drops, creams, and over-the-counter medicines.  Previous problems you or members of your family have had with the use of anesthetics.  Any blood disorders you have.  Previous surgeries you have had.  Medical conditions you have.  Possibility of pregnancy, if this applies.  If you are currently breastfeeding. RISKS AND COMPLICATIONS Generally, this is a safe procedure. However, as with any procedure, complications can occur. Possible complications include:  You develop pain or pressure in the following areas:  Chest.  Jaw or neck.  Between your shoulder blades.  Radiating down your left arm.  Headache.  Dizziness or light-headedness.  Shortness of breath.  Increased or irregular heartbeat.  Low blood pressure.  Nausea or vomiting.  Flushing.  Redness going up the arm and slight pain during injection of medicine.  Heart attack (rare). BEFORE THE PROCEDURE   Avoid all forms of caffeine for 24 hours before your test or as directed by your health care provider. This includes coffee, tea (even decaffeinated tea), caffeinated sodas, chocolate, cocoa, and certain pain medicines.  Follow your health care provider's instructions regarding eating and drinking before the test.  Take your medicines as directed at regular times with water unless instructed otherwise. Exceptions may include:  If you have diabetes, ask how you are to take your insulin or pills. It is common to adjust insulin dosing the morning of the test.  If you are taking beta-blocker medicines, it is important to talk to your health care provider about these medicines well before  the date of your test. Taking beta-blocker medicines may interfere with the test. In some cases, these medicines need to be changed or stopped 24 hours or more before the test.  If you wear a nitroglycerin patch, it may need to be removed prior to the test. Ask your health care provider if the patch should be removed before the test.  If you use an inhaler for any breathing condition, bring it with you to the test.  If you are an outpatient, bring a snack so you can eat right after the stress phase of the test.  Do not smoke for 4 hours prior to the test or as directed by your health care provider.  Do not apply lotions, powders, creams, or oils on your chest prior to the test.  Wear comfortable shoes and clothing. Let your health care provider know if you were unable to complete or follow the preparations for your test. PROCEDURE   Multiple patches (electrodes) will be put on your chest. If  needed, small areas of your chest may be shaved to get better contact with the electrodes. Once the electrodes are attached to your body, multiple wires will be attached to the electrodes, and your heart rate will be monitored.  An IV access will be started. A nuclear trace (isotope) is given. The isotope may be given intravenously, or it may be swallowed. Nuclear refers to several types of radioactive isotopes, and the nuclear isotope lights up the arteries so that the nuclear images are clear. The isotope is absorbed by your body. This results in low radiation exposure.  A resting nuclear image is taken to show how your heart functions at rest.  A medicine is given through the IV access.  A second scan is done about 1 hour after the medicine injection and determines how your heart functions under stress.  During this stress phase, you will be connected to an electrocardiogram machine. Your blood pressure and oxygen levels will be monitored. AFTER THE PROCEDURE   Your heart rate and blood pressure  will be monitored after the test.  You may return to your normal schedule, including diet,activities, and medicines, unless your health care provider tells you otherwise.   This information is not intended to replace advice given to you by your health care provider. Make sure you discuss any questions you have with your health care provider.   Document Released: 10/29/2008 Document Revised: 06/17/2013 Document Reviewed: 02/17/2013 Elsevier Interactive Patient Education [employer].

## 2016-03-29 NOTE — Telephone Encounter (Signed)
I resent it on the 2nd of October with R73.01 on the Rx; I'll send it again

## 2016-03-29 NOTE — Telephone Encounter (Signed)
Patient is in town and is wanting to pick up her test strips and needles today. The pharmacy told her that they could not give her the prescription because they are needing a dx code.

## 2016-03-29 NOTE — Progress Notes (Signed)
Cardiology Office Note   Date:  03/29/2016   ID:  Sydney Mcdaniel, DOB 05-01-1925, MRN [id number]  Referring Doctor:  Enid Derry, MD   Cardiologist:   Wende Bushy, MD   Reason for consultation:  Chief Complaint  Patient presents with  . New Patient (Initial Visit)    no cp, sob or swelling. no other complaints.   Episode of passing out, bradycardia    History of Present Illness: Sydney Mcdaniel is a 82 y.o. female who presents for Episode of passing out  Episode occurred some time a week ago. Patient was at home. The daughter just left around 2:30 or 3:30 in the afternoon to go back to her own house. Patient then walked outside to go to the mailbox. She then decided to do some cleaning in the yard picking up some twigs she probably spent around 10-15 minutes in the yard. It was a very hot day. She felt like she needed to go back to the house and drink something cold. She got to her kitchen. She opened up a bottle of likely [facility], poured into her glass. She remembers putting back the lid on the bottle. And after that, the next thing she remembers was that she was on the ground. She fell down to her sideways, and came to when she heard the alarm of her monitor going off. She was unable to get up and she will felt weak. Based on the timing and the recollection from her and from her daughter, she was likely out for less than a minute or so. She denies getting  chest pain or shortness of breath or palpitations before the episode. All she could remember was she felt she needed to drink something as she will it was very hot outside.  She then started checking her blood pressure after that event. She noticed that the top number was running relatively low in the 100s and 100 teens. Previously was in the 120s and 130s. That was something new for her. Next para she eventually followed up for her PCP and the thinking was the episode may have been related to relative hypotension, especially  on a very hot day. Medications were adjusted. Amlodipine was stopped. Lisinopril was reduced to half her usual dose.  Since then, her blood pressures at the top in the 120s and 130s again. Today's blood pressure is unusually high for her in the field that she may be anxious about today's visit.  She is otherwise not sedentary. She continues to work in the yard. She denies chest pain, shortness of breath, palpitations. Episodes of passing out many years ago and that was thought to be related to her drinking very poorly and getting dehydrated. She has been conscious about the amount of fluids that she drinks and she feels that she is adequately drinking.  She denies PND, orthopnea, edema. No chest pain, palpitations, edema.  ROS:  Please see the history of present illness. Aside from mentioned under HPI, all other systems are reviewed and negative.     Past Medical History:  Diagnosis Date  . Allergy   . Anemia   . Cataract   . Glaucoma   . H/O: hysterectomy    Total  . Hyperlipidemia   . Hypertension   . Hypothyroidism   . Impaired fasting glucose   . Lichen sclerosus   . Osteoporosis    Hips    Past Surgical History:  Procedure Laterality Date  . APPENDECTOMY    .  EYE SURGERY     Glaucoma  . TOTAL ABDOMINAL HYSTERECTOMY       reports that she has never smoked. She has never used smokeless tobacco. She reports that she does not drink alcohol or use drugs.   family history includes Glaucoma in her mother; Heart attack in her brother, father, and mother; Parkinson's disease in her brother.   Outpatient Medications Prior to Visit  Medication Sig Dispense Refill  . ACCU-CHEK SOFTCLIX LANCETS lancets Use as instructed; check once a day 100 each 12  . aspirin 81 MG tablet Take 81 mg by mouth daily.    Marland Kitchen atorvastatin (LIPITOR) 40 MG tablet Take 0.5 tablets (20 mg total) by mouth at bedtime. 90 tablet 3  . Calcium Carb-Cholecalciferol (CALCIUM 600 + D PO) Take by mouth daily.      Marland Kitchen glucose blood (ACCU-CHEK AVIVA PLUS) test strip Use as instructed, check once a day 100 each 12  . ipratropium (ATROVENT) 0.03 % nasal spray Place 2 sprays into both nostrils 3 (three) times daily. 90 mL 3  . latanoprost (XALATAN) 0.005 % ophthalmic solution     . levothyroxine (SYNTHROID, LEVOTHROID) 75 MCG tablet One by mouth on Mondays, Wednesdays, and Fridays 40 tablet 1  . levothyroxine (SYNTHROID, LEVOTHROID) 88 MCG tablet One by mouth on Tuesdays, Thursdays, Saturdays, and Sundays 52 tablet 1  . lisinopril (PRINIVIL,ZESTRIL) 10 MG tablet Take 1 tablet (10 mg total) by mouth daily. 90 tablet 3  . Multiple Vitamins-Minerals (MULTIVITAMIN ADULT PO) Take by mouth.    . Omega-3 Fatty Acids (FISH OIL) 1200 MG CAPS Take by mouth daily.    . timolol (TIMOPTIC) 0.5 % ophthalmic solution Place 1 drop into the right eye 2 (two) times daily.      No facility-administered medications prior to visit.      Allergies: Alphagan [brimonidine] and Pravachol [pravastatin sodium]    PHYSICAL EXAM: VS:  BP (!) 170/66 (BP Location: Left Arm, Patient Position: Sitting, Cuff Size: Normal)   Pulse 71   Ht 5\' 2"  (1.575 m)   Wt 134 lb 1.9 oz (60.8 kg)   LMP  (LMP Unknown)   BMI 24.53 kg/m  , Body mass index is 24.53 kg/m. Wt Readings from Last 3 Encounters:  03/29/16 134 lb 1.9 oz (60.8 kg)  03/22/16 133 lb 5 oz (60.5 kg)  02/25/16 135 lb (61.2 kg)    GENERAL:  well developed, well nourished, not in acute distress HEENT: normocephalic, pink conjunctivae, anicteric sclerae, no xanthelasma, normal dentition, oropharynx clear NECK:  no neck vein engorgement, JVP normal, no hepatojugular reflux, carotid upstroke brisk and symmetric, no bruit, no thyromegaly, no lymphadenopathy LUNGS:  good respiratory effort, clear to auscultation bilaterally CV:  PMI not displaced, no thrills, no lifts, S1 and S2 within normal limits, no palpable S3 or S4, no murmurs, no rubs, no gallops ABD:  Soft, nontender,  nondistended, normoactive bowel sounds, no abdominal aortic bruit, no hepatomegaly, no splenomegaly MS: nontender back, no kyphosis, no scoliosis, no joint deformities EXT:  2+ DP/PT pulses, no edema, no varicosities, no cyanosis, no clubbing SKIN: warm, nondiaphoretic, normal turgor, no ulcers NEUROPSYCH: alert, oriented to person, place, and time, sensory/motor grossly intact, normal mood, appropriate affect  Recent Labs: 12/21/2015: ALT 12; BUN 13; Creat 0.83; Magnesium 2.2; Sodium 133 12/22/2015: Potassium 4.6 03/22/2016: Hemoglobin 11.3; Platelets 219; TSH 5.28   Lipid Panel    Component Value Date/Time   CHOL 128 12/21/2015 0950   CHOL 141 08/24/2015 0856  TRIG 111 12/21/2015 0950   HDL 62 12/21/2015 0950   HDL 52 08/24/2015 0856   CHOLHDL 2.1 12/21/2015 0950   VLDL 22 12/21/2015 0950   LDLCALC 44 12/21/2015 0950   LDLCALC 64 08/24/2015 0856     Other studies Reviewed:  EKG:  The ekg from 03/29/2016 was personally reviewed by me and it revealed sinus rhythm, 71 BPM. First-degree AV block. Incomplete left bundle-branch block.  Additional studies/ records that were reviewed personally reviewed by me today include: None available   ASSESSMENT AND PLAN:  Episode of passing out  History of slow heart rate Abnormal EKG with first-degree AV block and incomplete left bundle branch block  Based on her history, it is likely related to relative hypotension in the setting of walking and bending down outside on a very hot day She has noticed improvement in her blood pressure Recommend complete evaluation with echocardiogram, Holter monitor, and pharmacologic nuclear stress. Patient unable to walk for long on treadmill.  Htn BP is unusually high today. There may be element of whitecoat hypertension and anxiety about today's visit. Otherwise, at home her blood pressures been in the [id number] to [id number] systolic.Marland Kitchen Continue monitoring BP. Continue current medical therapy and lifestyle changes.  PCP has been managing medication adjustments.  Hyperlipidemia PCP following labs   Current medicines are reviewed at length with the patient today.  The patient does not have concerns regarding medicines.  Labs/ tests ordered today include: No orders of the defined types were placed in this encounter.   I had a lengthy and detailed discussion with the patient regarding diagnoses, prognosis, diagnostic options, treatment options , and side effects of medications.   I counseled the patient on importance of lifestyle modification including heart healthy diet, regular physical activity once cardiac workup is completed   Disposition:   FU with undersigned after tests   Signed, Wende Bushy, MD  03/29/2016 11:41 AM    [place]  This note was generated in part with voice recognition software and I apologize for any typographical errors that were not detected and corrected.

## 2016-03-29 NOTE — Addendum Note (Signed)
Addended by: Lachrista Heslin, Satira Anis on: 03/29/2016 01:07 PM   Modules accepted: Orders

## 2016-03-29 NOTE — Telephone Encounter (Signed)
I spoke with daughter; high pressures at both doctor offices today Will start pt back on amlodipine 5 mg daily, start now Relax, watch something enjoyable on tv, avoid salt Monitor, but not too much Keep me posted

## 2016-03-29 NOTE — Telephone Encounter (Signed)
Patient daughter called back about blood pressure reading average has been 130/56.  But today has had 2 Dr. appt and has been running high 170's/90's.  At last appt you stopped amlodopine and decreased lisinopril to 10 mg please advise?

## 2016-04-04 DIAGNOSIS — N8184 Pelvic muscle wasting: Secondary | ICD-10-CM | POA: Diagnosis not present

## 2016-04-04 DIAGNOSIS — N8111 Cystocele, midline: Secondary | ICD-10-CM | POA: Diagnosis not present

## 2016-04-07 ENCOUNTER — Telehealth: Payer: Self-pay | Admitting: Cardiology

## 2016-04-07 NOTE — Telephone Encounter (Signed)
Reviewed lexiscan instructions w/pt who verbalized understanding.

## 2016-04-10 ENCOUNTER — Encounter
Admission: RE | Admit: 2016-04-10 | Discharge: 2016-04-10 | Disposition: A | Discharge status: Home / Self Care | Payer: Medicare Other | Source: Ambulatory Visit | Attending: Cardiology | Admitting: Cardiology

## 2016-04-10 DIAGNOSIS — R9431 Abnormal electrocardiogram [ECG] [EKG]: Secondary | ICD-10-CM | POA: Diagnosis not present

## 2016-04-10 DIAGNOSIS — R55 Syncope and collapse: Secondary | ICD-10-CM | POA: Diagnosis not present

## 2016-04-10 LAB — NM MYOCAR MULTI W/SPECT W/WALL MOTION / EF
CHL CUP STRESS STAGE 2 GRADE: 0 %
CHL CUP STRESS STAGE 2 HR: 64 {beats}/min
CHL CUP STRESS STAGE 2 SPEED: 0 mph
CHL CUP STRESS STAGE 3 HR: 77 {beats}/min
CHL CUP STRESS STAGE 4 HR: 77 {beats}/min
CHL CUP STRESS STAGE 5 DBP: 56 mmHg
CHL CUP STRESS STAGE 5 GRADE: 0 %
CHL CUP STRESS STAGE 5 SPEED: 0 mph
CSEPPHR: 77 {beats}/min
Estimated workload: 1 METS
Exercise duration (min): 0 min
Exercise duration (sec): 0 s
LVDIAVOL: 58 mL (ref 46–106)
LVSYSVOL: 21 mL
MPHR: 132 {beats}/min
Percent HR: 65 %
Percent of predicted max HR: 58 %
Rest HR: 62 {beats}/min
SDS: 0
SRS: 2
SSS: 1
Stage 1 Grade: 0 %
Stage 1 HR: 64 {beats}/min
Stage 1 Speed: 0 mph
Stage 3 Grade: 0 %
Stage 3 Speed: 0 mph
Stage 4 Grade: 0 %
Stage 4 Speed: 0 mph
Stage 5 HR: 74 {beats}/min
Stage 5 SBP: 115 mmHg
TID: 0.94

## 2016-04-10 MED ORDER — REGADENOSON 0.4 MG/5ML IV SOLN
0.4000 mg | Freq: Once | INTRAVENOUS | Status: AC
  Administered 2016-04-10: 0.4 mg via INTRAVENOUS

## 2016-04-10 MED ORDER — TECHNETIUM TC 99M TETROFOSMIN IV KIT
13.0000 | PACK | Freq: Once | INTRAVENOUS | Status: AC | PRN
Start: 2016-04-10 — End: 2016-04-10
  Administered 2016-04-10: 12.545 via INTRAVENOUS

## 2016-04-10 MED ORDER — TECHNETIUM TC 99M TETROFOSMIN IV KIT
33.0000 | PACK | Freq: Once | INTRAVENOUS | Status: AC | PRN
  Administered 2016-04-10: 32.66 via INTRAVENOUS

## 2016-04-12 ENCOUNTER — Ambulatory Visit (INDEPENDENT_AMBULATORY_CARE_PROVIDER_SITE_OTHER): Payer: Medicare Other | Admitting: Family Medicine

## 2016-04-12 ENCOUNTER — Encounter: Payer: Self-pay | Admitting: Family Medicine

## 2016-04-12 ENCOUNTER — Telehealth: Payer: Self-pay

## 2016-04-12 DIAGNOSIS — N8110 Cystocele, unspecified: Secondary | ICD-10-CM

## 2016-04-12 DIAGNOSIS — R7301 Impaired fasting glucose: Secondary | ICD-10-CM

## 2016-04-12 DIAGNOSIS — I6523 Occlusion and stenosis of bilateral carotid arteries: Secondary | ICD-10-CM

## 2016-04-12 DIAGNOSIS — I10 Essential (primary) hypertension: Secondary | ICD-10-CM

## 2016-04-12 DIAGNOSIS — R55 Syncope and collapse: Secondary | ICD-10-CM | POA: Diagnosis not present

## 2016-04-12 MED ORDER — ACCU-CHEK SOFTCLIX LANCETS MISC: 12 refills | Status: DC

## 2016-04-12 MED ORDER — ATORVASTATIN CALCIUM 20 MG PO TABS: 20.0000 mg | ORAL_TABLET | Freq: Every day | ORAL | 3 refills | Status: DC

## 2016-04-12 MED ORDER — GLUCOSE BLOOD VI STRP: ORAL_STRIP | 12 refills | Status: DC

## 2016-04-12 NOTE — Progress Notes (Signed)
BP (!) 160/80   Pulse 60   Temp 97.9 F (36.6 C) (Oral)   Resp 16   Wt 133 lb (60.3 kg)   LMP  (LMP Unknown)   SpO2 98%   BMI 24.33 kg/m    Subjective:    Patient ID: Sydney Mcdaniel, female    DOB: 1925/02/18, 82 y.o.   MRN: [id number]  HPI: NAILA AXT is a 82 y.o. female  Chief Complaint  Patient presents with  . Follow-up   Patient is here for f/u with her daughter No more episodes of feeling weak or like she'll pass out She had nuclear medicine stress test; she had a little headache, no other symptoms; daughter says it was fine; goes for echo and holter monitor next week Checking BP and pulse at home We reviewed those together, look good for the most part; today's reading is elevated relative to what she's getting at home Back on amlodipine which really helped bring it down Staying away from salt  Pessary did not work; fell out and she went back three times in two weeks; they tried different sizes, but he doesn't think it would help; nocturia not as bad; during the day, sitting watching tv with feet up; laying down help She saw Dr. Kenton Kingfisher; he knew some people down there; he could f/u with her locally  She still needs test strips; the diagnosis code is on there, so we'll reprint and have them take them again; she has prediabetes  Relevant past medical, surgical, family and social history reviewed Past Medical History:  Diagnosis Date  . Allergy   . Anemia   . Cataract   . Glaucoma   . H/O: hysterectomy    Total  . Hyperlipidemia   . Hypertension   . Hypothyroidism   . Impaired fasting glucose   . Lichen sclerosus   . Osteoporosis    Hips   Past Surgical History:  Procedure Laterality Date  . APPENDECTOMY    . EYE SURGERY     Glaucoma  . TOTAL ABDOMINAL HYSTERECTOMY     Family History  Problem Relation Age of Onset  . Heart attack Mother   . Glaucoma Mother   . Heart attack Father   . Parkinson's disease Brother   . Heart attack Brother     Social History  Substance Use Topics  . Smoking status: Never Smoker  . Smokeless tobacco: Never Used  . Alcohol use No   Interim medical history since last visit reviewed. Allergies and medications reviewed  Review of Systems Per HPI unless specifically indicated above     Objective:    BP (!) 160/80   Pulse 60   Temp 97.9 F (36.6 C) (Oral)   Resp 16   Wt 133 lb (60.3 kg)   LMP  (LMP Unknown)   SpO2 98%   BMI 24.33 kg/m   Wt Readings from Last 3 Encounters:  04/12/16 133 lb (60.3 kg)  03/29/16 134 lb 1.9 oz (60.8 kg)  03/22/16 133 lb 5 oz (60.5 kg)    Physical Exam  Constitutional: She appears well-developed and well-nourished. No distress.  Eyes: EOM are normal. No scleral icterus.  Neck: No JVD present. No thyromegaly present.  Cardiovascular: Normal rate and regular rhythm.   Pulmonary/Chest: Effort normal and breath sounds normal.  Abdominal: She exhibits no distension.  Neurological: She is alert.  Skin: No pallor.  Psychiatric: She has a normal mood and affect.   Results for orders placed  or performed during the hospital encounter of 04/10/16  NM Myocar Multi W/Spect W/Wall Motion / EF  Result Value Ref Range   Rest HR 62 bpm   Rest BP 115/56 mmHg   Exercise duration (sec) 0 sec   Percent HR 65 %   Exercise duration (min) 0 min   Estimated workload 1.0 METS   Peak HR 77 BPM   Peak BP  mmHg   MPHR 132 bpm   SSS 1    SRS 2    SDS 0    TID 0.94    LV sys vol 21 mL   LV dias vol 58 46 - 106 mL   Phase 1 name PREINFSN    Stage 1 Name SUPINE    Stage 1 Time 00:01:09    Stage 1 Speed 0.0 mph   Stage 1 Grade 0.0 %   Stage 1 HR 64 bpm   Phase 2 Name Infusion    Stage 2 Name DOSE 1    Stage 2 Attribute Baseline    Stage 2 Time 00:00:00    Stage 2 Speed 0.0 mph   Stage 2 Grade 0.0 %   Stage 2 HR 64 bpm   Phase 3 Name Infusion    Stage 3 Name DOSE 1    Stage 3 Time 00:01:00    Stage 3 Speed 0.0 mph   Stage 3 Grade 0.0 %   Stage 3 HR 77 bpm    Phase 4 Name Infusion    Stage 4 Name DOSE 2    Stage 4 Attribute Peak    Stage 4 Time 00:00:01    Stage 4 Speed 0.0 mph   Stage 4 Grade 0.0 %   Stage 4 HR 77 bpm   Phase 5 Name POSTINFSN    Stage 5 Time 00:04:00    Stage 5 Speed 0.0 mph   Stage 5 Grade 0.0 %   Stage 5 HR 74 bpm   Stage 5 SBP 115 mmHg   Stage 5 DBP 56 mmHg   Percent of predicted max HR 58 %      Assessment & Plan:   Problem List Items Addressed This Visit      Cardiovascular and Mediastinum   Syncope    No further symptoms; thankful for eval by cardiologist; keep future appts for visits and tests      Relevant Medications   atorvastatin (LIPITOR) 20 MG tablet   Essential hypertension, benign    Improved with re-introduction of the CCB; monitoring BP at home; stay hydrated; avoid excessive sodium      Relevant Medications   atorvastatin (LIPITOR) 20 MG tablet     Endocrine   Impaired fasting glucose   Relevant Medications   ACCU-CHEK SOFTCLIX LANCETS lancets   glucose blood (ACCU-CHEK AVIVA PLUS) test strip     Genitourinary   Bladder prolapse, female, acquired    Grade 3 cystocoele; suggested she see urogynecologist       Other Visit Diagnoses   None.      Follow up plan: Return in about 2 months (around 06/22/2016) for visit already schedule.  An after-visit summary was printed and given to the patient at [facility].  Please see the patient instructions which may contain other information and recommendations beyond what is mentioned above in the assessment and plan.  Meds ordered this encounter  Medications  . ACCU-CHEK SOFTCLIX LANCETS lancets    Sig: Use as instructed; check once a day; R73.01    Dispense:  100 each    Refill:  12    R73.01  . glucose blood (ACCU-CHEK AVIVA PLUS) test strip    Sig: Use as instructed, check once a day; R73.01    Dispense:  100 each    Refill:  12    R73.01  . atorvastatin (LIPITOR) 20 MG tablet    Sig: Take 1 tablet (20 mg total) by mouth at  bedtime.    Dispense:  90 tablet    Refill:  3    No orders of the defined types were placed in this encounter.

## 2016-04-12 NOTE — Telephone Encounter (Signed)
Pharmacy called  ins will not pay for her test strips and lancets with that dx code? Please change and send back to pharmacy?

## 2016-04-12 NOTE — Patient Instructions (Addendum)
Try to use PLAIN allergy medicine without the decongestant or cold or flu medicine Avoid: phenylephrine, phenylpropanolamine, and pseudoephredine Continue the same medicines  [facility] Urogynecology [facility] [address] [place]. [place], [place] [address] Appointment Line  [phone] Nurse Line  [phone]   Give Dr. Kenton Kingfisher a call if you would like to see them You will be due for a recheck of your thyroid on or just after November 23rd I'll see you for your December appointment, but call sooner if needed

## 2016-04-17 ENCOUNTER — Other Ambulatory Visit: Payer: Self-pay

## 2016-04-17 ENCOUNTER — Ambulatory Visit (INDEPENDENT_AMBULATORY_CARE_PROVIDER_SITE_OTHER): Payer: Medicare Other

## 2016-04-17 ENCOUNTER — Encounter (INDEPENDENT_AMBULATORY_CARE_PROVIDER_SITE_OTHER): Payer: Medicare Other

## 2016-04-17 DIAGNOSIS — R9431 Abnormal electrocardiogram [ECG] [EKG]: Secondary | ICD-10-CM

## 2016-04-17 DIAGNOSIS — R55 Syncope and collapse: Secondary | ICD-10-CM | POA: Diagnosis not present

## 2016-04-17 NOTE — Assessment & Plan Note (Signed)
No further symptoms; thankful for eval by cardiologist; keep future appts for visits and tests

## 2016-04-17 NOTE — Telephone Encounter (Signed)
She doesn't have diabetes, so the diagnosis code is "prediabetes" If Medicare won't allow her to test her sugars, then it will just be up to family if they want to test She does NOT need to test on my account; only if she wants to; sorry Medicare won't pay Thank you

## 2016-04-17 NOTE — Telephone Encounter (Signed)
Left detailed voicemail

## 2016-04-17 NOTE — Assessment & Plan Note (Signed)
Grade 3 cystocoele; suggested she see urogynecologist

## 2016-04-17 NOTE — Assessment & Plan Note (Signed)
Improved with re-introduction of the CCB; monitoring BP at home; stay hydrated; avoid excessive sodium

## 2016-04-19 ENCOUNTER — Other Ambulatory Visit: Payer: Self-pay | Admitting: *Deleted

## 2016-04-19 DIAGNOSIS — I351 Nonrheumatic aortic (valve) insufficiency: Secondary | ICD-10-CM

## 2016-04-20 ENCOUNTER — Ambulatory Visit
Admission: RE | Admit: 2016-04-20 | Discharge: 2016-04-20 | Disposition: A | Discharge status: Home / Self Care | Payer: Medicare Other | Source: Ambulatory Visit | Attending: Cardiology | Admitting: Cardiology

## 2016-04-20 DIAGNOSIS — R9431 Abnormal electrocardiogram [ECG] [EKG]: Secondary | ICD-10-CM | POA: Insufficient documentation

## 2016-04-20 DIAGNOSIS — R55 Syncope and collapse: Secondary | ICD-10-CM | POA: Insufficient documentation

## 2016-04-20 NOTE — Progress Notes (Signed)
I believe we are waiting on Holter, correct? We can wait on what that shows. If is is not completely normal we will let her know if she needs to come in. Otherwise, ca nffup after echo in 1 year

## 2016-04-21 ENCOUNTER — Inpatient Hospital Stay: Payer: Medicare Other

## 2016-04-21 ENCOUNTER — Emergency Department
Admission: EM | Admit: 2016-04-21 | Discharge: 2016-04-21 | Disposition: A | Discharge status: Home / Self Care | Payer: Medicare Other | Attending: Emergency Medicine | Admitting: Emergency Medicine

## 2016-04-21 ENCOUNTER — Encounter: Payer: Self-pay | Admitting: Emergency Medicine

## 2016-04-21 ENCOUNTER — Other Ambulatory Visit: Payer: Self-pay

## 2016-04-21 ENCOUNTER — Other Ambulatory Visit: Payer: Self-pay | Admitting: *Deleted

## 2016-04-21 ENCOUNTER — Other Ambulatory Visit: Payer: Self-pay | Admitting: Hematology and Oncology

## 2016-04-21 ENCOUNTER — Inpatient Hospital Stay: Payer: Medicare Other | Attending: Hematology and Oncology | Admitting: Hematology and Oncology

## 2016-04-21 ENCOUNTER — Encounter: Payer: Self-pay | Admitting: Family Medicine

## 2016-04-21 ENCOUNTER — Encounter: Payer: Self-pay | Admitting: Hematology and Oncology

## 2016-04-21 VITALS — BP 137/52 | HR 48 | Temp 98.6°F | Wt 131.8 lb

## 2016-04-21 DIAGNOSIS — C9110 Chronic lymphocytic leukemia of B-cell type not having achieved remission: Secondary | ICD-10-CM

## 2016-04-21 DIAGNOSIS — Z79899 Other long term (current) drug therapy: Secondary | ICD-10-CM | POA: Insufficient documentation

## 2016-04-21 DIAGNOSIS — Z7982 Long term (current) use of aspirin: Secondary | ICD-10-CM | POA: Insufficient documentation

## 2016-04-21 DIAGNOSIS — I10 Essential (primary) hypertension: Secondary | ICD-10-CM | POA: Insufficient documentation

## 2016-04-21 DIAGNOSIS — E785 Hyperlipidemia, unspecified: Secondary | ICD-10-CM | POA: Diagnosis not present

## 2016-04-21 DIAGNOSIS — H409 Unspecified glaucoma: Secondary | ICD-10-CM | POA: Insufficient documentation

## 2016-04-21 DIAGNOSIS — D649 Anemia, unspecified: Secondary | ICD-10-CM

## 2016-04-21 DIAGNOSIS — E039 Hypothyroidism, unspecified: Secondary | ICD-10-CM | POA: Insufficient documentation

## 2016-04-21 DIAGNOSIS — E538 Deficiency of other specified B group vitamins: Secondary | ICD-10-CM

## 2016-04-21 DIAGNOSIS — Z90710 Acquired absence of both cervix and uterus: Secondary | ICD-10-CM | POA: Diagnosis not present

## 2016-04-21 DIAGNOSIS — R55 Syncope and collapse: Secondary | ICD-10-CM | POA: Diagnosis not present

## 2016-04-21 LAB — COMPREHENSIVE METABOLIC PANEL
ALT: 18 U/L (ref 14–54)
AST: 25 U/L (ref 15–41)
Albumin: 3.9 g/dL (ref 3.5–5.0)
Alkaline Phosphatase: 25 U/L — ABNORMAL LOW (ref 38–126)
Anion gap: 9 (ref 5–15)
BUN: 15 mg/dL (ref 6–20)
CO2: 24 mmol/L (ref 22–32)
Calcium: 9 mg/dL (ref 8.9–10.3)
Chloride: 97 mmol/L — ABNORMAL LOW (ref 101–111)
Creatinine, Ser: 0.72 mg/dL (ref 0.44–1.00)
GFR calc Af Amer: >60 mL/min (ref >60)
GFR calc non Af Amer: >60 mL/min (ref >60)
Glucose, Bld: 105 mg/dL — ABNORMAL HIGH (ref 65–99)
Potassium: 4.4 mmol/L (ref 3.5–5.1)
Sodium: 130 mmol/L — ABNORMAL LOW (ref 135–145)
Total Bilirubin: 0.6 mg/dL (ref 0.3–1.2)
Total Protein: 7.1 g/dL (ref 6.5–8.1)

## 2016-04-21 LAB — URINALYSIS COMPLETE WITH MICROSCOPIC (ARMC ONLY)
BACTERIA UA: NONE SEEN
Bilirubin Urine: NEGATIVE
GLUCOSE, UA: NEGATIVE mg/dL
HGB URINE DIPSTICK: NEGATIVE
Ketones, ur: NEGATIVE mg/dL
LEUKOCYTES UA: NEGATIVE
NITRITE: NEGATIVE
Protein, ur: NEGATIVE mg/dL
SPECIFIC GRAVITY, URINE: 1.004 — AB (ref 1.005–1.030)
Squamous Epithelial / LPF: NONE SEEN
pH: 6 (ref 5.0–8.0)

## 2016-04-21 LAB — CBC WITH DIFFERENTIAL/PLATELET
Basophils Absolute: 0.2 10*3/uL — ABNORMAL HIGH (ref 0–0.1)
Basophils Relative: 2 %
Eosinophils Absolute: 0.2 10*3/uL (ref 0–0.7)
Eosinophils Relative: 3 %
HCT: 32 % — ABNORMAL LOW (ref 35.0–47.0)
Hemoglobin: 10.8 g/dL — ABNORMAL LOW (ref 12.0–16.0)
Lymphocytes Relative: 56 %
Lymphs Abs: 4.1 10*3/uL — ABNORMAL HIGH (ref 1.0–3.6)
MCH: 31.1 pg (ref 26.0–34.0)
MCHC: 33.9 g/dL (ref 32.0–36.0)
MCV: 91.6 fL (ref 80.0–100.0)
Monocytes Absolute: 0.5 10*3/uL (ref 0.2–0.9)
Monocytes Relative: 7 %
Neutro Abs: 2.3 10*3/uL (ref 1.4–6.5)
Neutrophils Relative %: 32 %
Platelets: 207 10*3/uL (ref 150–440)
RBC: 3.49 MIL/uL — ABNORMAL LOW (ref 3.80–5.20)
RDW: 14.1 % (ref 11.5–14.5)
WBC: 7.2 10*3/uL (ref 3.6–11.0)

## 2016-04-21 LAB — TROPONIN I: Troponin I: <0.03 ng/mL (ref <0.03)

## 2016-04-21 LAB — LACTATE DEHYDROGENASE: LDH: 123 U/L (ref 98–192)

## 2016-04-21 MED ORDER — CYANOCOBALAMIN 1000 MCG/ML IJ SOLN
1000.0000 ug | Freq: Once | INTRAMUSCULAR | Status: AC
  Administered 2016-04-21: 1000 ug via INTRAMUSCULAR
  Filled 2016-04-21: qty 1

## 2016-04-21 NOTE — Consult Note (Signed)
CH(s) visited room to visit with patient and family. All were in good spirit and hoping to be discharged today. Provided conversation and encouragement as well as education about chaplain services.

## 2016-04-21 NOTE — ED Provider Notes (Signed)
[facility] Emergency Department Provider Note  ____________________________________________   First MD Initiated Contact with Patient 04/21/16 1529     (approximate)  I have reviewed the triage vital signs and the nursing notes.   HISTORY  Chief Complaint Loss of Consciousness (Pt. states syncope episode today, denies injury.)   HPI Sydney Mcdaniel is a 82 y.o. female is an 82 year old female who is returning after an episode of syncope. She says that she had a similar episode one month ago and has adnexal cardiac exam as well as a stress test which have not revealed any acute pathology. Mild defect on the stress test has been attributed to breast attenuation. Patient said that she was coming back from her appointment this morning with her oncologist after a B12 infusion.  She said that she had eaten lunch at [employer] and then had abdominal cramping. She said that the cramping continued and she tried to get to the bathroom because she felt that she had to move her bowels. However, she became diaphoretic as well as had a lightheaded sensation with a hot sensation throughout her body and then passed out. Medics say she was unconscious for about 20 seconds. Patient denies hitting her head or any pain at this time. Denies any chest pain or shortness of breath. Patient lost control of her bowels during this episode. Had a loose stool. Denies any pain at this time in her abdomen.   Past Medical History:  Diagnosis Date  . Allergy   . Anemia   . Cataract   . Glaucoma   . H/O: hysterectomy    Total  . Hyperlipidemia   . Hypertension   . Hypothyroidism   . Impaired fasting glucose   . Lichen sclerosus   . Osteoporosis    Hips    Patient Active Problem List   Diagnosis Date Noted  . Syncope 03/22/2016  . Bladder prolapse, female, acquired 02/25/2016  . Urinary tract infection 02/25/2016  . 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  . B12 deficiency  01/11/2016  . Anemia 01/10/2016  . CLL (chronic lymphocytic leukemia) ([place]) 01/10/2016  . Hyperkalemia 12/22/2015  . 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 12/21/2015  . Bradycardia 12/21/2015  . Lymphocytosis 06/25/2015  . Preventative health care 06/22/2015  . Impaired fasting glucose 06/22/2015  . Medication monitoring encounter 06/22/2015  . Nocturia 06/22/2015  . Carotid atherosclerosis 06/22/2015  . Essential hypertension, benign 12/22/2014  . Hypercholesteremia 12/22/2014  . Hypothyroidism 12/22/2014    Past Surgical History:  Procedure Laterality Date  . APPENDECTOMY    . EYE SURGERY     Glaucoma  . TOTAL ABDOMINAL HYSTERECTOMY      Prior to Admission medications   Medication Sig Start Date End Date Taking? Authorizing Provider  ACCU-CHEK SOFTCLIX LANCETS lancets Use as instructed; check once a day; R73.01 04/12/16   Arnetha Courser, MD  amLODipine (NORVASC) 5 MG tablet Take 1 tablet (5 mg total) by mouth daily. 03/29/16   Arnetha Courser, MD  aspirin 81 MG tablet Take 81 mg by mouth daily.    Historical Provider, MD  atorvastatin (LIPITOR) 20 MG tablet Take 1 tablet (20 mg total) by mouth at bedtime. 04/12/16   Arnetha Courser, MD  Calcium Carb-Cholecalciferol (CALCIUM 600 + D PO) Take by mouth daily.    Historical Provider, MD  glucose blood (ACCU-CHEK AVIVA PLUS) test strip Use as instructed, check  once a day; R73.01 04/12/16   Arnetha Courser, MD  ipratropium (ATROVENT) 0.03 % nasal spray Place 2 sprays into both nostrils 3 (three) times daily. Patient taking differently: Place 2 sprays into both nostrils 3 (three) times daily as needed.  12/21/15   Arnetha Courser, MD  latanoprost (XALATAN) 0.005 % ophthalmic solution  04/24/15   Historical Provider, MD  levothyroxine (SYNTHROID, LEVOTHROID) 75 MCG tablet One by mouth on Mondays, Wednesdays, and Fridays 03/23/16   Arnetha Courser, MD  levothyroxine  (SYNTHROID, LEVOTHROID) 88 MCG tablet One by mouth on Tuesdays, Thursdays, Saturdays, and Sundays 03/23/16   Arnetha Courser, MD  lisinopril (PRINIVIL,ZESTRIL) 10 MG tablet Take 1 tablet (10 mg total) by mouth daily. 03/22/16   Arnetha Courser, MD  Multiple Vitamins-Minerals (MULTIVITAMIN ADULT PO) Take by mouth.    Historical Provider, MD  Omega-3 Fatty Acids (FISH OIL) 1200 MG CAPS Take by mouth daily.    Historical Provider, MD  timolol (TIMOPTIC) 0.5 % ophthalmic solution Place 1 drop into the right eye 2 (two) times daily.  04/24/15   Historical Provider, MD    Allergies Alphagan [brimonidine] and Pravachol [pravastatin sodium]  Family History  Problem Relation Age of Onset  . Heart attack Mother   . Glaucoma Mother   . Heart attack Father   . Parkinson's disease Brother   . Heart attack Brother     Social History Social History  Substance Use Topics  . Smoking status: Never Smoker  . Smokeless tobacco: Never Used  . Alcohol use No    Review of Systems Constitutional: No fever/chills Eyes: No visual changes. ENT: No sore throat. Cardiovascular: Denies chest pain. Respiratory: Denies shortness of breath. Gastrointestinal:  No nausea, no vomiting.   No constipation. Genitourinary: Negative for dysuria. Musculoskeletal: Negative for back pain. Skin: Negative for rash. Neurological: Negative for headaches, focal weakness or numbness.  10-point ROS otherwise negative.  ____________________________________________   PHYSICAL EXAM:  VITAL SIGNS: ED Triage Vitals  Enc Vitals Group     BP 04/21/16 1532 (!) 157/49     Pulse Rate 04/21/16 1532 67     Resp 04/21/16 1532 13     Temp --      Temp src --      SpO2 04/21/16 1525 98 %     Weight 04/21/16 1532 132 lb (59.9 kg)     Height 04/21/16 1532 5\' 3"  (1.6 m)     Head Circumference --      Peak Flow --      Pain Score 04/21/16 1533 0     Pain Loc --      Pain Edu? --      Excl. in [place]? --     Constitutional: Alert  and oriented. Well appearing and in no acute distress. Eyes: Conjunctivae are normal. PERRL. EOMI. Head: Atraumatic. Nose: No congestion/rhinnorhea. Mouth/Throat: Mucous membranes are moist.   Neck: No stridor.   Cardiovascular: Normal rate, regular rhythm. Grossly normal heart sounds.   Respiratory: Normal respiratory effort.  No retractions. Lungs CTAB. Gastrointestinal: Soft and nontender. No distention. No CVA tenderness. Musculoskeletal: No lower extremity tenderness nor edema.  No joint effusions. Neurologic:  Normal speech and language. No gross focal neurologic deficits are appreciated. Skin:  Skin is warm, dry and intact. No rash noted. Psychiatric: Mood and affect are normal. Speech and behavior are normal.  ____________________________________________   LABS (all labs ordered are listed, but only abnormal results are displayed)  Labs Reviewed  URINALYSIS COMPLETEWITH MICROSCOPIC ([facility] ONLY) - Abnormal; Notable for the following:       Result Value   Color, Urine YELLOW (*)    APPearance CLEAR (*)    Specific Gravity, Urine 1.004 (*)    All other components within normal limits  TROPONIN I   ____________________________________________  EKG  ED ECG REPORT I, Sammye Staff,  Youlanda Roys, the attending physician, personally viewed and interpreted this ECG.   Date: 04/21/2016  EKG Time: 1534  Rate: 65  Rhythm: normal sinus rhythm  Axis: Normal  Intervals:Prolonged PR interval. intraventricular conduction delay.  ST&T Change: No ST segment elevation or depression. No abnormal T-wave inversion. Patient without any significant change from EKG done on 03/29/2016. ____________________________________________  RADIOLOGY   ____________________________________________   PROCEDURES  Procedure(s) performed:   Procedures  Critical Care performed:   ____________________________________________   INITIAL IMPRESSION / ASSESSMENT AND PLAN / ED COURSE  Pertinent labs &  imaging results that were available during my care of the patient were reviewed by me and considered in my medical decision making (see chart for details).  ----------------------------------------- 6:20 PM on 04/21/2016 -----------------------------------------  Patient feeling well this time. Reassuring lab work. I reviewed her lab work from earlier in the day as well. Very mild low sodium at 130. I do not believe that this is contributing to her episode of syncope. Her family is now the bedside and say that she passed out was sitting in a chair. They said they lowered her to the ground at which point, when she was horizontal, she became awake and alert. Likely vasovagal episode. We'll be following up with her primary care doctor. We'll discharge to home.  Clinical Course     ____________________________________________   FINAL CLINICAL IMPRESSION(S) / ED DIAGNOSES  Syncope.    NEW MEDICATIONS STARTED DURING THIS VISIT:  New Prescriptions   No medications on file     Note:  This document was prepared using Dragon voice recognition software and may include unintentional dictation errors.    Orbie Pyo, MD 04/21/16 Tresa Moore

## 2016-04-21 NOTE — Progress Notes (Signed)
[facility] day:  04/21/16  Chief Complaint: Sydney Mcdaniel is a 82 y.o. female with chronic lymphocytic leukemia and B12 deficiency who is seen for 3 month assessment.  HPI:  The patient was last seen in the hematology clinic on 02/01/2016.  At that time, work-up was reviewed.  She was documented with CLL and B12 deficiency.  She had started weekly B12 x 6 on 01/18/2016.  She felt stronger.  We discussed indications for treatment of her CLL.  As she was asymptomatic with stage 0 CLL, we discussed observation.   She began monthly B12 on 03/22/2016.  CBC on 03/22/2016 included a hematocrit 32.4, hemoglobin 11.3, MCV 90.9, platelets 219,000, white count 8400 with an ANC of 3000. Absolute lymphocyte count was 4300.  Symptomatically, she notes "so far, so good'.  She states that she passed out recently on a hot day picking up limbs. She has undergone stress test, Holter monitoring, and echo.  She stats that she has a leaky aortic valve.  Her blood pressure medications have been changed.  She denies any B symptoms.  She denies any adenopathy, bruising, bleeding, or infections.   Past Medical History:  Diagnosis Date  . Allergy   . Anemia   . Cataract   . Glaucoma   . H/O: hysterectomy    Total  . Hyperlipidemia   . Hypertension   . Hypothyroidism   . Impaired fasting glucose   . Lichen sclerosus   . Osteoporosis    Hips    Past Surgical History:  Procedure Laterality Date  . APPENDECTOMY    . EYE SURGERY     Glaucoma  . TOTAL ABDOMINAL HYSTERECTOMY      Family History  Problem Relation Age of Onset  . Heart attack Mother   . Glaucoma Mother   . Heart attack Father   . Parkinson's disease Brother   . Heart attack Brother     Social History:  reports that she has never smoked. She has never used smokeless tobacco. She reports that she does not drink alcohol or use drugs.  Her husband died in 13-Jan-2015.  The patient is accompanied  by her daughter, [name], today.  Allergies:  Allergies  Allergen Reactions  . Alphagan [Brimonidine]   . Pravachol [Pravastatin Sodium]     Current Medications: Current Outpatient Prescriptions  Medication Sig Dispense Refill  . ACCU-CHEK SOFTCLIX LANCETS lancets Use as instructed; check once a day; R73.01 100 each 12  . amLODipine (NORVASC) 5 MG tablet Take 1 tablet (5 mg total) by mouth daily. 90 tablet 1  . aspirin 81 MG tablet Take 81 mg by mouth daily.    Marland Kitchen atorvastatin (LIPITOR) 20 MG tablet Take 1 tablet (20 mg total) by mouth at bedtime. 90 tablet 3  . Calcium Carb-Cholecalciferol (CALCIUM 600 + D PO) Take by mouth daily.    Marland Kitchen glucose blood (ACCU-CHEK AVIVA PLUS) test strip Use as instructed, check once a day; R73.01 100 each 12  . ipratropium (ATROVENT) 0.03 % nasal spray Place 2 sprays into both nostrils 3 (three) times daily. (Patient taking differently: Place 2 sprays into both nostrils 3 (three) times daily as needed. ) 90 mL 3  . latanoprost (XALATAN) 0.005 % ophthalmic solution     . levothyroxine (SYNTHROID, LEVOTHROID) 75 MCG tablet One by mouth on Mondays, Wednesdays, and Fridays 40 tablet 1  . levothyroxine (SYNTHROID, LEVOTHROID) 88 MCG tablet One by mouth on Tuesdays, Thursdays, Saturdays, and  Sundays 52 tablet 1  . lisinopril (PRINIVIL,ZESTRIL) 10 MG tablet Take 1 tablet (10 mg total) by mouth daily. 90 tablet 3  . Multiple Vitamins-Minerals (MULTIVITAMIN ADULT PO) Take by mouth.    . Omega-3 Fatty Acids (FISH OIL) 1200 MG CAPS Take by mouth daily.    . timolol (TIMOPTIC) 0.5 % ophthalmic solution Place 1 drop into the right eye 2 (two) times daily.      No current facility-administered medications for this visit.     Review of Systems:  GENERAL:  Feels "ok".  No fevers or sweats.  Weight down 3 pounds. PERFORMANCE STATUS (ECOG):  1 HEENT:  Glaucoma.  No visual changes, runny nose, sore throat, mouth sores or tenderness. Lungs: No shortness of breath or cough.   No hemoptysis. Cardiac:  Baseline bradycardia.  No chest pain, palpitations, orthopnea, or PND.  Interval syncope with work-up (echo, stress test, Holter monitoring). GI:  No nausea, vomiting, diarrhea, constipation, melena or hematochezia.  Colonoscopy > 10 years ago. GU:  No urgency, frequency, dysuria, or hematuria. Up 2-3 times at night. Musculoskeletal:  No back pain.  No joint pain.  No muscle tenderness. Extremities:  No pain or swelling. Skin:  No rashes or skin changes. Neuro:  No headache, numbness or weakness, balance or coordination issues. Endocrine:  No diabetes.  On Synthroid for thyroid disease.  No hot flashes or night sweats. Psych:  No mood changes, depression or anxiety. Pain:  No focal pain. Review of systems:  All other systems reviewed and found to be negative.  Physical Exam: Blood pressure (!) 137/52, pulse (!) 48, temperature 98.6 F (37 C), temperature source Tympanic, weight 131 lb 13.4 oz (59.8 kg). GENERAL:  Well developed, well nourished, sitting comfortably in the exam room in no acute distress. MENTAL STATUS:  Alert and oriented to person, place and time. HEAD:  Curly gray hair.  Normocephalic, atraumatic, face symmetric, no Cushingoid features. EYES:  Glasses.  Blue eyes.  Pupils equal round and reactive to light and accomodation.  No conjunctivitis or scleral icterus. ENT:  Oropharynx clear without lesion.  Tongue normal. Mucous membranes moist.  RESPIRATORY:  Clear to auscultation without rales, wheezes or rhonchi. CARDIOVASCULAR:  bradycardia.  Regular rate and rhythm without murmur, rub or gallop. ABDOMEN:  Soft, non-tender, with active bowel sounds, and no hepatosplenomegaly.  No masses. SKIN:  No rashes, ulcers or lesions. EXTREMITIES: No edema, no skin discoloration or tenderness.  No palpable cords. LYMPH NODES: No palpable cervical, supraclavicular, axillary or inguinal adenopathy  NEUROLOGICAL: Unremarkable. PSYCH:  Appropriate.   Appointment  on 04/21/2016  Component Date Value Ref Range Status  . WBC 04/21/2016 7.2  3.6 - 11.0 K/uL Final  . RBC 04/21/2016 3.49* 3.80 - 5.20 MIL/uL Final  . Hemoglobin 04/21/2016 10.8* 12.0 - 16.0 g/dL Final  . HCT 04/21/2016 32.0* 35.0 - 47.0 % Final  . MCV 04/21/2016 91.6  80.0 - 100.0 fL Final  . MCH 04/21/2016 31.1  26.0 - 34.0 pg Final  . MCHC 04/21/2016 33.9  32.0 - 36.0 g/dL Final  . RDW 04/21/2016 14.1  11.5 - 14.5 % Final  . Platelets 04/21/2016 207  150 - 440 K/uL Final  . Neutrophils Relative % 04/21/2016 32  % Final  . Neutro Abs 04/21/2016 2.3  1.4 - 6.5 K/uL Final  . Lymphocytes Relative 04/21/2016 56  % Final  . Lymphs Abs 04/21/2016 4.1* 1.0 - 3.6 K/uL Final  . Monocytes Relative 04/21/2016 7  % Final  .  Monocytes Absolute 04/21/2016 0.5  0.2 - 0.9 K/uL Final  . Eosinophils Relative 04/21/2016 3  % Final  . Eosinophils Absolute 04/21/2016 0.2  0 - 0.7 K/uL Final  . Basophils Relative 04/21/2016 2  % Final  . Basophils Absolute 04/21/2016 0.2* 0 - 0.1 K/uL Final  . Sodium 04/21/2016 130* 135 - 145 mmol/L Final  . Potassium 04/21/2016 4.4  3.5 - 5.1 mmol/L Final  . Chloride 04/21/2016 97* 101 - 111 mmol/L Final  . CO2 04/21/2016 24  22 - 32 mmol/L Final  . Glucose, Bld 04/21/2016 105* 65 - 99 mg/dL Final  . BUN 04/21/2016 15  6 - 20 mg/dL Final  . Creatinine, Ser 04/21/2016 0.72  0.44 - 1.00 mg/dL Final  . Calcium 04/21/2016 9.0  8.9 - 10.3 mg/dL Final  . Total Protein 04/21/2016 7.1  6.5 - 8.1 g/dL Final  . Albumin 04/21/2016 3.9  3.5 - 5.0 g/dL Final  . AST 04/21/2016 25  15 - 41 U/L Final  . ALT 04/21/2016 18  14 - 54 U/L Final  . Alkaline Phosphatase 04/21/2016 25* 38 - 126 U/L Final  . Total Bilirubin 04/21/2016 0.6  0.3 - 1.2 mg/dL Final  . GFR calc non Af Amer 04/21/2016 >60  >60 mL/min Final  . GFR calc Af Amer 04/21/2016 >60  >60 mL/min Final   Comment: (NOTE) The eGFR has been calculated using the CKD EPI equation. This calculation has not been validated in all  clinical situations. eGFR's persistently <60 mL/min signify possible Chronic Kidney Disease.   . Anion gap 04/21/2016 9  5 - 15 Final  . LDH 04/21/2016 123  98 - 192 U/L Final    Assessment:  MACYN SHROPSHIRE is a 82 y.o. female with stage 0 CLL.  She presented with a distant history of anemia and recent lymphocytosis.  She was noted to have mild lymphocystosis in 06/22/2015.  Absolute lymphocyte count (ALC) has ranged between 4700 - 5500.  Work-up on 01/10/2016 revealed a hematocrit of 31.6, hemoglobin 11.4, MCV 89, platelets 203,000, WBC 11,00 with an ANC of 3700.  Absolute lymphocyte count was 6400.  Ferritin was 116.  B12 was 119 (low).  Folate was 44.0.  Retic was 0.9% (low).  Uric acid was 4.5.  Guaiac cards were negative x 3 in 12/2015.  She was diagnosed with B12 deficiency.  She began weekly B12 x 6 on 01/18/2016 and is on monthly B12 (03/22/2016).  Peripheral blood flow cytometry revealed involvement by a CD5+, CD23+, CD38- monoclonal B cell population with lambda light chain restriction consistent with CLL/SLL.  There was a small population (8% of the T cells) of double positive (CD4+/CD8+) T cells (described in association with chronic viral infections, autoimmune disorders, chronic inflammatory disorders, and immunodeficiency states).   She has a stable mild normocytic anemia.  Diet is modest.  She denies any melena or hematochezia.  Guaiac cards were negative x 3 in 12/2015.  Last colonoscopy was > 10 years ago.  She recently had a syncopal episode on a hot day picking up limbs.  She has baseline bradycardia.  She has undergone stress test, Holter monitoring, and echo.  Her blood pressure medications have been changed.  Symptomatically, she denies any B symptoms.  Exam reveals no palpable adenopathy or hepatosplenomegaly.  Plan: 1.  Labs today:  CBC with diff, CMP, LDH. 2.  Discuss bradycardia (daughter notes baseline) and recent syncope and work-up. 3.  B12 today. 4.  RTC  monthly x 3  for B12 5.  RTC in 4 months for MD assessment, labs (CBC with diff, BMP, LDH, uric acid, ferritin, iron studies, folate, retic) + B12   Lequita Asal, MD  04/21/2016, 10:59 AM

## 2016-04-21 NOTE — ED Triage Notes (Signed)
Pt. States syncope episode today.  Pt. Was helped to ground.  Pt. Had cardiac stress test performed today before syncope event.  Pt. Denies injury.

## 2016-04-21 NOTE — ED Notes (Signed)
Pt. States syncope episode today when she came home from store.  Pt. States after visiting cardiologist today, she went home and had some abdominal pain on car ride home.  Upon arriving at home pt. States feeling dizzy inside house.  Family had pt. Sit in chair.  Pt. Had syncope episode and family helped to floor and called EMS.  Pt. States upon standing she felt no injury and states feeling fine.  Pt. Had last syncope episode last month and has had stress test and cardiac echo in the past two weeks.

## 2016-04-23 ENCOUNTER — Encounter: Payer: Self-pay | Admitting: Hematology and Oncology

## 2016-04-23 ENCOUNTER — Telehealth: Payer: Self-pay | Admitting: Family Medicine

## 2016-04-23 NOTE — Telephone Encounter (Signed)
Labs reviewed; she is not lipemic; I don't think this is pseudohyponatremia; she's not on a thiazide; she does not have heart failure, cirrhosis, fluid overload, or paraproteinemia -------------------------------------- Sydney Mcdaniel: Please call pt/daughter Dr. Kem Parkinson office sent me copies of her labs drawn there last week Her sodium and chloride are dropping and her urine is really dilute First, make sure she's not drinking too much water Excessive water can cause sodium to drop; we'll suggest she limit to 1500 cc a day Next, I'd like her to come by for some labsMonday morning or Tuesday morning: 1. BMP 2. urine osmolality 3. serum osmolality 4. urinary sodium 5. urinary chloride 6. Cortisol (needs to be drawn before 10 am) 7. Aldosterone (please order, diagnosis: hyponatremia and hypochloremia) The urine and blood need to be collected at about the same time (for example, she can't bring [place] the urine later) Thank you

## 2016-04-24 ENCOUNTER — Other Ambulatory Visit: Payer: Self-pay

## 2016-04-24 DIAGNOSIS — E871 Hypo-osmolality and hyponatremia: Secondary | ICD-10-CM | POA: Diagnosis not present

## 2016-04-24 DIAGNOSIS — E878 Other disorders of electrolyte and fluid balance, not elsewhere classified: Secondary | ICD-10-CM | POA: Diagnosis not present

## 2016-04-24 NOTE — Telephone Encounter (Signed)
Patients daughter notified and labs ordered.  Will come by tomorrow for labs

## 2016-04-25 ENCOUNTER — Other Ambulatory Visit (INDEPENDENT_AMBULATORY_CARE_PROVIDER_SITE_OTHER): Payer: Self-pay | Admitting: Vascular Surgery

## 2016-04-25 ENCOUNTER — Encounter: Payer: Self-pay | Admitting: Family Medicine

## 2016-04-25 DIAGNOSIS — E119 Type 2 diabetes mellitus without complications: Secondary | ICD-10-CM | POA: Diagnosis not present

## 2016-04-25 DIAGNOSIS — H401131 Primary open-angle glaucoma, bilateral, mild stage: Secondary | ICD-10-CM | POA: Diagnosis not present

## 2016-04-25 DIAGNOSIS — I672 Cerebral atherosclerosis: Secondary | ICD-10-CM

## 2016-04-25 LAB — BASIC METABOLIC PANEL WITH GFR
BUN: 11 mg/dL (ref 7–25)
CHLORIDE: 100 mmol/L (ref 98–110)
CO2: 24 mmol/L (ref 20–31)
Calcium: 9.3 mg/dL (ref 8.6–10.4)
Creat: 0.83 mg/dL (ref 0.60–0.88)
GFR, EST NON AFRICAN AMERICAN: 63 mL/min (ref >=60)
GFR, Est African American: 73 mL/min (ref >=60)
GLUCOSE: 106 mg/dL — AB (ref 65–99)
POTASSIUM: 4.8 mmol/L (ref 3.5–5.3)
Sodium: 135 mmol/L (ref 135–146)

## 2016-04-26 ENCOUNTER — Ambulatory Visit (INDEPENDENT_AMBULATORY_CARE_PROVIDER_SITE_OTHER): Payer: Medicare Other | Admitting: Vascular Surgery

## 2016-04-26 ENCOUNTER — Encounter (INDEPENDENT_AMBULATORY_CARE_PROVIDER_SITE_OTHER): Payer: Self-pay | Admitting: Vascular Surgery

## 2016-04-26 ENCOUNTER — Ambulatory Visit (INDEPENDENT_AMBULATORY_CARE_PROVIDER_SITE_OTHER): Payer: Medicare Other

## 2016-04-26 VITALS — BP 173/61 | HR 62 | Resp 16 | Wt 132.2 lb

## 2016-04-26 DIAGNOSIS — R7301 Impaired fasting glucose: Secondary | ICD-10-CM | POA: Diagnosis not present

## 2016-04-26 DIAGNOSIS — I6523 Occlusion and stenosis of bilateral carotid arteries: Secondary | ICD-10-CM

## 2016-04-26 DIAGNOSIS — E7800 Pure hypercholesterolemia, unspecified: Secondary | ICD-10-CM

## 2016-04-26 DIAGNOSIS — I672 Cerebral atherosclerosis: Secondary | ICD-10-CM | POA: Diagnosis not present

## 2016-04-26 LAB — CHLORIDE, URINE, RANDOM: CHLORIDE URINE: 59 mmol/L (ref 32–290)

## 2016-04-26 LAB — CORTISOL: CORTISOL PLASMA: 16.8 ug/dL

## 2016-04-26 LAB — OSMOLALITY, URINE: Osmolality, Ur: 208 mOsm/kg (ref 50–1200)

## 2016-04-26 LAB — OSMOLALITY: Osmolality: 283 mOsm/kg (ref 278–305)

## 2016-04-26 NOTE — Progress Notes (Signed)
Subjective:    Patient ID: Sydney Mcdaniel, female    DOB: 14-Aug-1924, 82 y.o.   MRN: [id number] Chief Complaint  Patient presents with  . Follow-up   Patient presents for a six month non-invasive study follow up for carotid stenosis. The stenosis has been followed by surveillance duplexes. The patient underwent a bilateral carotid duplex scan which showed no change from the previous exam on 10/25/15. Duplex is stable at 40-59% ICA stenosis bilaterally. The patient denies experiencing Amaurosis Fugax, TIA like symptoms or focal motor deficits.        Review of Systems  Constitutional: Negative.   HENT: Negative.   Eyes: Negative.   Respiratory: Negative.   Cardiovascular: Negative.   Gastrointestinal: Negative.   Endocrine: Negative.   Genitourinary: Negative.   Musculoskeletal: Negative.   Skin: Negative.   Allergic/Immunologic: Negative.   Neurological: Negative.   Hematological: Negative.   Psychiatric/Behavioral: Negative.        Objective:   Physical Exam  Constitutional: She is oriented to person, place, and time. She appears well-developed and well-nourished.  HENT:  Head: Normocephalic and atraumatic.  Eyes: EOM are normal. Pupils are equal, round, and reactive to light.  Neck: Normal range of motion.  Cardiovascular: Normal rate, regular rhythm, normal heart sounds and intact distal pulses.   Pulses:      Radial pulses are 2+ on the right side, and 2+ on the left side.       Dorsalis pedis pulses are 1+ on the right side, and 1+ on the left side.       Posterior tibial pulses are 1+ on the right side, and 1+ on the left side.  Bilateral Carotid Bruits Noted.  Pulmonary/Chest: Effort normal and breath sounds normal.  Abdominal: Soft. Bowel sounds are normal.  Musculoskeletal: Normal range of motion. She exhibits no edema.  Neurological: She is alert and oriented to person, place, and time.  Skin: Skin is warm and dry.  Psychiatric: She has a normal mood and  affect. Her behavior is normal. Judgment and thought content normal.    BP (!) 173/61 (BP Location: Left Arm)   Pulse 62   Resp 16   Wt 132 lb 3.2 oz (60 kg)   LMP  (LMP Unknown)   BMI 23.42 kg/m   Past Medical History:  Diagnosis Date  . Allergy   . Anemia   . Cataract   . Glaucoma   . H/O: hysterectomy    Total  . Hyperlipidemia   . Hypertension   . Hypothyroidism   . Impaired fasting glucose   . Lichen sclerosus   . Osteoporosis    Hips    Social History   Social History  . Marital status: Married    Spouse name: N/A  . Number of children: N/A  . Years of education: N/A   Occupational History  . Not on file.   Social History Main Topics  . Smoking status: Never Smoker  . Smokeless tobacco: Never Used  . Alcohol use No  . Drug use: No  . Sexual activity: No   Other Topics Concern  . Not on file   Social History Narrative  . No narrative on file    Past Surgical History:  Procedure Laterality Date  . APPENDECTOMY    . EYE SURGERY     Glaucoma  . TOTAL ABDOMINAL HYSTERECTOMY      Family History  Problem Relation Age of Onset  . Heart attack Mother   .  Glaucoma Mother   . Heart attack Father   . Parkinson's disease Brother   . Heart attack Brother     Allergies  Allergen Reactions  . Alphagan [Brimonidine]   . Pravachol [Pravastatin Sodium]        Assessment & Plan:  Patient presents for a six month non-invasive study follow up for carotid stenosis. The stenosis has been followed by surveillance duplexes. The patient underwent a bilateral carotid duplex scan which showed no change from the previous exam on 10/25/15. Duplex is stable at 40-59% ICA stenosis bilaterally. The patient denies experiencing Amaurosis Fugax, TIA like symptoms or focal motor deficits.   1. Atherosclerosis of both carotid arteries - Stable Studies reviewed with patient. Patient asymptomatic with stable duplex. No intervention at this time. Patient to return in one  year for surveillance carotid duplex. Patient to continue medical optimization with ASA and dyslipidemia medication. Patient to remain abstinent of tobacco use. I have discussed with the patient at length the risk factors for and pathogenesis of atherosclerotic disease and encouraged a healthy diet, regular exercise regimen and blood pressure / glucose control.  Patient was instructed to contact our office in the interim with problems such as arm / leg weakness or numbness, speech / swallowing difficulty or temporary monocular blindness. The patient expresses their understanding.  2. Hypercholesteremia - Stable On ASA and statin for medical optimization. Encouraged good control as its slows the progression of atherosclerotic disease  3. Impaired fasting glucose - Stable Diet controlled. Encouraged good control as its slows the progression of atherosclerotic disease  Current Outpatient Prescriptions on File Prior to Visit  Medication Sig Dispense Refill  . ACCU-CHEK SOFTCLIX LANCETS lancets Use as instructed; check once a day; R73.01 100 each 12  . amLODipine (NORVASC) 5 MG tablet Take 1 tablet (5 mg total) by mouth daily. 90 tablet 1  . aspirin 81 MG tablet Take 81 mg by mouth daily.    Marland Kitchen atorvastatin (LIPITOR) 20 MG tablet Take 1 tablet (20 mg total) by mouth at bedtime. 90 tablet 3  . Calcium Carb-Cholecalciferol (CALCIUM 600 + D PO) Take by mouth daily.    Marland Kitchen glucose blood (ACCU-CHEK AVIVA PLUS) test strip Use as instructed, check once a day; R73.01 100 each 12  . ipratropium (ATROVENT) 0.03 % nasal spray Place 2 sprays into both nostrils 3 (three) times daily. (Patient taking differently: Place 2 sprays into both nostrils 3 (three) times daily as needed. ) 90 mL 3  . latanoprost (XALATAN) 0.005 % ophthalmic solution     . levothyroxine (SYNTHROID, LEVOTHROID) 75 MCG tablet One by mouth on Mondays, Wednesdays, and Fridays 40 tablet 1  . levothyroxine (SYNTHROID, LEVOTHROID) 88 MCG tablet  One by mouth on Tuesdays, Thursdays, Saturdays, and Sundays 52 tablet 1  . lisinopril (PRINIVIL,ZESTRIL) 10 MG tablet Take 1 tablet (10 mg total) by mouth daily. 90 tablet 3  . Multiple Vitamins-Minerals (MULTIVITAMIN ADULT PO) Take by mouth.    . Omega-3 Fatty Acids (FISH OIL) 1200 MG CAPS Take by mouth daily.    . timolol (TIMOPTIC) 0.5 % ophthalmic solution Place 1 drop into the right eye 2 (two) times daily.      No current facility-administered medications on file prior to visit.     There are no Patient Instructions on file for this visit. No Follow-up on file.   Breea Loncar A Sharae Zappulla, PA-C

## 2016-04-27 ENCOUNTER — Encounter: Payer: Self-pay | Admitting: Family Medicine

## 2016-04-27 ENCOUNTER — Other Ambulatory Visit: Payer: Self-pay | Admitting: Family Medicine

## 2016-04-27 DIAGNOSIS — R7309 Other abnormal glucose: Secondary | ICD-10-CM | POA: Insufficient documentation

## 2016-04-27 DIAGNOSIS — R7301 Impaired fasting glucose: Secondary | ICD-10-CM

## 2016-04-27 LAB — SODIUM, URINE, RANDOM: Sodium, Ur: 55 mmol/L (ref 28–272)

## 2016-04-27 MED ORDER — ACCU-CHEK SOFTCLIX LANCETS MISC: 12 refills | Status: DC

## 2016-04-27 MED ORDER — GLUCOSE BLOOD VI STRP: ORAL_STRIP | 3 refills | Status: DC

## 2016-04-27 NOTE — Progress Notes (Signed)
I found eye doctor note; fluctuating glucose; new Rx sent with ICD-10 code of R73.09

## 2016-04-27 NOTE — Telephone Encounter (Signed)
Amber, can you call the eye clinic and get the diagnosis code that may help pay for her strips? Thank you!

## 2016-04-28 LAB — ALDOSTERONE: ALDOSTERONE, SERUM: 1 ng/dL

## 2016-04-28 NOTE — Telephone Encounter (Signed)
I have E11.9- diabetic type 2 and if that code don't work. H40.1131 borderline glaucoma.

## 2016-04-30 ENCOUNTER — Encounter: Payer: Self-pay | Admitting: Family Medicine

## 2016-04-30 DIAGNOSIS — R7301 Impaired fasting glucose: Secondary | ICD-10-CM

## 2016-05-01 ENCOUNTER — Telehealth: Payer: Self-pay | Admitting: Family Medicine

## 2016-05-01 NOTE — Telephone Encounter (Signed)
Pt requesting that you please call her daughter if you have any question. States Dr Ellin Mayhew sent paperwork over last week (her last eye exam). In order for patient to get her sugar testing supplies (strips and needles) you will have to send different code. States Medicare will not accept the codes that you are sending over. Pt uses [facility]  [name] (daughter) [phone]

## 2016-05-01 NOTE — Telephone Encounter (Signed)
Patient daughter notified ins still will not cover dx code for test strips

## 2016-05-02 ENCOUNTER — Ambulatory Visit: Payer: Medicare Other | Admitting: Cardiology

## 2016-05-08 DIAGNOSIS — H401131 Primary open-angle glaucoma, bilateral, mild stage: Secondary | ICD-10-CM | POA: Insufficient documentation

## 2016-05-08 MED ORDER — GLUCOSE BLOOD VI STRP: ORAL_STRIP | 3 refills | Status: DC

## 2016-05-08 MED ORDER — ACCU-CHEK SOFTCLIX LANCETS MISC: 12 refills | Status: DC

## 2016-05-12 ENCOUNTER — Ambulatory Visit: Payer: Medicare Other

## 2016-05-15 ENCOUNTER — Inpatient Hospital Stay: Payer: Medicare Other | Attending: Hematology and Oncology

## 2016-05-15 DIAGNOSIS — E785 Hyperlipidemia, unspecified: Secondary | ICD-10-CM | POA: Diagnosis not present

## 2016-05-15 DIAGNOSIS — D649 Anemia, unspecified: Secondary | ICD-10-CM | POA: Insufficient documentation

## 2016-05-15 DIAGNOSIS — E039 Hypothyroidism, unspecified: Secondary | ICD-10-CM | POA: Insufficient documentation

## 2016-05-15 DIAGNOSIS — Z90710 Acquired absence of both cervix and uterus: Secondary | ICD-10-CM | POA: Diagnosis not present

## 2016-05-15 DIAGNOSIS — Z7982 Long term (current) use of aspirin: Secondary | ICD-10-CM | POA: Diagnosis not present

## 2016-05-15 DIAGNOSIS — C9110 Chronic lymphocytic leukemia of B-cell type not having achieved remission: Secondary | ICD-10-CM | POA: Insufficient documentation

## 2016-05-15 DIAGNOSIS — I10 Essential (primary) hypertension: Secondary | ICD-10-CM | POA: Insufficient documentation

## 2016-05-15 DIAGNOSIS — E538 Deficiency of other specified B group vitamins: Secondary | ICD-10-CM | POA: Insufficient documentation

## 2016-05-15 DIAGNOSIS — H409 Unspecified glaucoma: Secondary | ICD-10-CM | POA: Insufficient documentation

## 2016-05-15 DIAGNOSIS — Z79899 Other long term (current) drug therapy: Secondary | ICD-10-CM | POA: Insufficient documentation

## 2016-05-15 MED ORDER — CYANOCOBALAMIN 1000 MCG/ML IJ SOLN
1000.0000 ug | Freq: Once | INTRAMUSCULAR | Status: AC
  Administered 2016-05-15: 1000 ug via INTRAMUSCULAR
  Filled 2016-05-15: qty 1

## 2016-05-24 ENCOUNTER — Encounter: Payer: Self-pay | Admitting: Emergency Medicine

## 2016-05-24 ENCOUNTER — Observation Stay
Admission: EM | Admit: 2016-05-24 | Discharge: 2016-05-25 | Disposition: A | Discharge status: Home / Self Care | Payer: Medicare Other | Attending: Internal Medicine | Admitting: Internal Medicine

## 2016-05-24 ENCOUNTER — Emergency Department: Payer: Medicare Other

## 2016-05-24 DIAGNOSIS — Z79899 Other long term (current) drug therapy: Secondary | ICD-10-CM | POA: Diagnosis not present

## 2016-05-24 DIAGNOSIS — I951 Orthostatic hypotension: Secondary | ICD-10-CM | POA: Diagnosis not present

## 2016-05-24 DIAGNOSIS — I10 Essential (primary) hypertension: Secondary | ICD-10-CM | POA: Diagnosis present

## 2016-05-24 DIAGNOSIS — C9110 Chronic lymphocytic leukemia of B-cell type not having achieved remission: Secondary | ICD-10-CM | POA: Diagnosis not present

## 2016-05-24 DIAGNOSIS — I6529 Occlusion and stenosis of unspecified carotid artery: Secondary | ICD-10-CM | POA: Diagnosis present

## 2016-05-24 DIAGNOSIS — R0789 Other chest pain: Secondary | ICD-10-CM | POA: Insufficient documentation

## 2016-05-24 DIAGNOSIS — R55 Syncope and collapse: Secondary | ICD-10-CM | POA: Diagnosis not present

## 2016-05-24 DIAGNOSIS — E785 Hyperlipidemia, unspecified: Secondary | ICD-10-CM | POA: Diagnosis present

## 2016-05-24 DIAGNOSIS — M6281 Muscle weakness (generalized): Secondary | ICD-10-CM

## 2016-05-24 DIAGNOSIS — E7800 Pure hypercholesterolemia, unspecified: Secondary | ICD-10-CM | POA: Insufficient documentation

## 2016-05-24 DIAGNOSIS — Z7982 Long term (current) use of aspirin: Secondary | ICD-10-CM | POA: Diagnosis not present

## 2016-05-24 DIAGNOSIS — R262 Difficulty in walking, not elsewhere classified: Secondary | ICD-10-CM

## 2016-05-24 DIAGNOSIS — R079 Chest pain, unspecified: Secondary | ICD-10-CM | POA: Diagnosis present

## 2016-05-24 DIAGNOSIS — E039 Hypothyroidism, unspecified: Secondary | ICD-10-CM | POA: Diagnosis not present

## 2016-05-24 DIAGNOSIS — H409 Unspecified glaucoma: Secondary | ICD-10-CM | POA: Diagnosis not present

## 2016-05-24 LAB — URINALYSIS COMPLETE WITH MICROSCOPIC (ARMC ONLY)
BILIRUBIN URINE: NEGATIVE
Bacteria, UA: NONE SEEN
GLUCOSE, UA: NEGATIVE mg/dL
Hgb urine dipstick: NEGATIVE
KETONES UR: NEGATIVE mg/dL
NITRITE: NEGATIVE
Protein, ur: NEGATIVE mg/dL
SPECIFIC GRAVITY, URINE: 1.01 (ref 1.005–1.030)
pH: 7 (ref 5.0–8.0)

## 2016-05-24 LAB — BASIC METABOLIC PANEL
Anion gap: 6 (ref 5–15)
BUN: 17 mg/dL (ref 6–20)
CO2: 25 mmol/L (ref 22–32)
CREATININE: 0.71 mg/dL (ref 0.44–1.00)
Calcium: 9 mg/dL (ref 8.9–10.3)
Chloride: 105 mmol/L (ref 101–111)
Glucose, Bld: 130 mg/dL — ABNORMAL HIGH (ref 65–99)
Potassium: 3.8 mmol/L (ref 3.5–5.1)
SODIUM: 136 mmol/L (ref 135–145)

## 2016-05-24 LAB — CBC
HCT: 32.3 % — ABNORMAL LOW (ref 35.0–47.0)
Hemoglobin: 10.7 g/dL — ABNORMAL LOW (ref 12.0–16.0)
MCH: 30.4 pg (ref 26.0–34.0)
MCHC: 33.2 g/dL (ref 32.0–36.0)
MCV: 91.5 fL (ref 80.0–100.0)
PLATELETS: 188 10*3/uL (ref 150–440)
RBC: 3.53 MIL/uL — AB (ref 3.80–5.20)
RDW: 14.4 % (ref 11.5–14.5)
WBC: 12.7 10*3/uL — AB (ref 3.6–11.0)

## 2016-05-24 LAB — TROPONIN I

## 2016-05-24 MED ORDER — ATORVASTATIN CALCIUM 20 MG PO TABS
20.0000 mg | ORAL_TABLET | Freq: Every day | ORAL | Status: DC
  Administered 2016-05-24: 20 mg via ORAL
  Filled 2016-05-24: qty 1

## 2016-05-24 MED ORDER — SODIUM CHLORIDE 0.9 % IV SOLN
Freq: Once | INTRAVENOUS | Status: AC
  Administered 2016-05-24: 1000 mL via INTRAVENOUS

## 2016-05-24 MED ORDER — ADULT MULTIVITAMIN W/MINERALS CH
1.0000 | ORAL_TABLET | Freq: Every day | ORAL | Status: DC
  Administered 2016-05-24 – 2016-05-25 (×2): 1 via ORAL
  Filled 2016-05-24 (×2): qty 1

## 2016-05-24 MED ORDER — KETOROLAC TROMETHAMINE 30 MG/ML IJ SOLN
15.0000 mg | Freq: Once | INTRAMUSCULAR | Status: AC
  Administered 2016-05-24: 15 mg via INTRAVENOUS
  Filled 2016-05-24: qty 1

## 2016-05-24 MED ORDER — OMEGA-3-ACID ETHYL ESTERS 1 G PO CAPS
1.0000 g | ORAL_CAPSULE | Freq: Every day | ORAL | Status: DC
  Administered 2016-05-24 – 2016-05-25 (×2): 1 g via ORAL
  Filled 2016-05-24 (×2): qty 1

## 2016-05-24 MED ORDER — ACETAMINOPHEN 650 MG RE SUPP: 650.0000 mg | Freq: Four times a day (QID) | RECTAL | Status: DC | PRN

## 2016-05-24 MED ORDER — ONDANSETRON HCL 4 MG PO TABS: 4.0000 mg | ORAL_TABLET | Freq: Four times a day (QID) | ORAL | Status: DC | PRN

## 2016-05-24 MED ORDER — ACETAMINOPHEN 325 MG PO TABS
650.0000 mg | ORAL_TABLET | Freq: Four times a day (QID) | ORAL | Status: DC | PRN
  Administered 2016-05-24 – 2016-05-25 (×2): 650 mg via ORAL
  Filled 2016-05-24 (×2): qty 2

## 2016-05-24 MED ORDER — AMLODIPINE BESYLATE 5 MG PO TABS
5.0000 mg | ORAL_TABLET | Freq: Every day | ORAL | Status: DC
  Administered 2016-05-24: 5 mg via ORAL
  Filled 2016-05-24: qty 1

## 2016-05-24 MED ORDER — LISINOPRIL 10 MG PO TABS
10.0000 mg | ORAL_TABLET | Freq: Every day | ORAL | Status: DC
  Administered 2016-05-24: 10 mg via ORAL
  Filled 2016-05-24: qty 1

## 2016-05-24 MED ORDER — LATANOPROST 0.005 % OP SOLN
1.0000 [drp] | Freq: Every day | OPHTHALMIC | Status: DC
  Administered 2016-05-24: 1 [drp] via OPHTHALMIC
  Filled 2016-05-24: qty 2.5

## 2016-05-24 MED ORDER — LEVOTHYROXINE SODIUM 88 MCG PO TABS
88.0000 ug | ORAL_TABLET | ORAL | Status: DC
  Administered 2016-05-25: 88 ug via ORAL
  Filled 2016-05-24: qty 1

## 2016-05-24 MED ORDER — ONDANSETRON HCL 4 MG/2ML IJ SOLN: 4.0000 mg | Freq: Four times a day (QID) | INTRAMUSCULAR | Status: DC | PRN

## 2016-05-24 MED ORDER — TIMOLOL MALEATE 0.5 % OP SOLN
1.0000 [drp] | Freq: Two times a day (BID) | OPHTHALMIC | Status: DC
  Administered 2016-05-24 – 2016-05-25 (×4): 1 [drp] via OPHTHALMIC
  Filled 2016-05-24: qty 5

## 2016-05-24 MED ORDER — ASPIRIN EC 81 MG PO TBEC
81.0000 mg | DELAYED_RELEASE_TABLET | Freq: Every day | ORAL | Status: DC
  Administered 2016-05-25: 81 mg via ORAL
  Filled 2016-05-24: qty 1

## 2016-05-24 MED ORDER — CALCIUM CARBONATE-VITAMIN D 500-200 MG-UNIT PO TABS
1.0000 | ORAL_TABLET | Freq: Every day | ORAL | Status: DC
  Administered 2016-05-24 – 2016-05-25 (×2): 1 via ORAL
  Filled 2016-05-24 (×2): qty 1

## 2016-05-24 MED ORDER — LEVOTHYROXINE SODIUM 75 MCG PO TABS: 75.0000 ug | ORAL_TABLET | ORAL | Status: DC

## 2016-05-24 MED ORDER — ENOXAPARIN SODIUM 40 MG/0.4ML ~~LOC~~ SOLN
40.0000 mg | SUBCUTANEOUS | Status: DC
  Administered 2016-05-24: 40 mg via SUBCUTANEOUS
  Filled 2016-05-24: qty 0.4

## 2016-05-24 NOTE — ED Triage Notes (Signed)
Pt in via EMS from home with complaints of sudden onset chest pain this morning.  Pt denies any associating symptoms at time of chest pain.  Pt did not wish to come to ER; upon attempting to stand and ambulate with EMS, pt with near syncopal episode, EMS reports orthostatic vitals 170/60 to 140/60.  Pt denies any chest pain at this time.  Pt complains of bilateral shoulder pain, states, "I may have slept on it wrong."  Pt A/Ox4, vitals WDL, no immediate distress at this time.

## 2016-05-24 NOTE — H&P (Signed)
[facility] NAME: Sydney Mcdaniel    MR#:  [id number]  DATE OF BIRTH:  May 10, 1925  DATE OF ADMISSION:  05/24/2016  PRIMARY CARE PHYSICIAN: Enid Derry, MD   REQUESTING/REFERRING PHYSICIAN: Dr Jimmye Norman  CHIEF COMPLAINT:  I was about to pass out  HISTORY OF PRESENT ILLNESS:  Sydney Mcdaniel  is a 82 y.o. female with a known history of Hypertension, B12 deficiency anemia follows with Dr. Mike Gip, glaucoma, hyperlipidemia comes to the emergency room after she had not been feeling well since she woke up this morning. She complained of some chest tightness to the daughter called EMS. She was checked out by EMS vitals stable however when patient tried to stand up she became very diaphoretic and pale and was about to pass out. At that time blood pressure readings were taken by EMS which outpatient has orthostatic hypotension. Clinically patient does not appear to be dehydrated. She did not lose consciousness incontinence. No seizure like activity or loss of consciousness was noted by family and EMS. In the emergency room patient is chest pain-free no shortness of breath felt well. She is requesting to go home however given her symptoms we'll admit her for overnight observation Daughter is present in the ER  PAST MEDICAL HISTORY:   Past Medical History:  Diagnosis Date  . Allergy   . Anemia   . Cataract   . Glaucoma   . H/O: hysterectomy    Total  . Hyperlipidemia   . Hypertension   . Hypothyroidism   . Impaired fasting glucose   . Lichen sclerosus   . Osteoporosis    Hips    PAST SURGICAL HISTOIRY:   Past Surgical History:  Procedure Laterality Date  . APPENDECTOMY    . EYE SURGERY     Glaucoma  . TOTAL ABDOMINAL HYSTERECTOMY      SOCIAL HISTORY:   Social History  Substance Use Topics  . Smoking status: Never Smoker  . Smokeless tobacco: Never Used  . Alcohol use No    FAMILY HISTORY:   Family History   Problem Relation Age of Onset  . Heart attack Mother   . Glaucoma Mother   . Heart attack Father   . Parkinson's disease Brother   . Heart attack Brother     DRUG ALLERGIES:   Allergies  Allergen Reactions  . Alphagan [Brimonidine]   . Pravachol [Pravastatin Sodium]     REVIEW OF SYSTEMS:  Review of Systems  Constitutional: Negative for chills, fever and weight loss.  HENT: Negative for ear discharge, ear pain and nosebleeds.   Eyes: Negative for blurred vision, pain and discharge.  Respiratory: Negative for sputum production, shortness of breath, wheezing and stridor.   Cardiovascular: Negative for chest pain, palpitations, orthopnea and PND.  Gastrointestinal: Negative for abdominal pain, diarrhea, nausea and vomiting.  Genitourinary: Negative for frequency and urgency.  Musculoskeletal: Negative for back pain and joint pain.  Neurological: Positive for weakness. Negative for sensory change, speech change and focal weakness.  Psychiatric/Behavioral: Negative for depression and hallucinations. The patient is not nervous/anxious.      MEDICATIONS AT HOME:   Prior to Admission medications   Medication Sig Start Date End Date Taking? Authorizing Provider  amLODipine (NORVASC) 5 MG tablet Take 1 tablet (5 mg total) by mouth daily. 03/29/16  Yes Arnetha Courser, MD  aspirin 81 MG tablet Take 81 mg by mouth daily.   Yes Historical Provider, MD  atorvastatin (LIPITOR)  20 MG tablet Take 1 tablet (20 mg total) by mouth at bedtime. 04/12/16  Yes Arnetha Courser, MD  Calcium Carb-Cholecalciferol (CALCIUM 600 + D PO) Take by mouth daily.   Yes Historical Provider, MD  latanoprost (XALATAN) 0.005 % ophthalmic solution Place 1 drop into the right eye at bedtime.  04/24/15  Yes Historical Provider, MD  levothyroxine (SYNTHROID, LEVOTHROID) 75 MCG tablet One by mouth on Mondays, Wednesdays, and Fridays 03/23/16  Yes Arnetha Courser, MD  levothyroxine (SYNTHROID, LEVOTHROID) 88 MCG tablet One by  mouth on Tuesdays, Thursdays, Saturdays, and Sundays 03/23/16  Yes Arnetha Courser, MD  lisinopril (PRINIVIL,ZESTRIL) 10 MG tablet Take 1 tablet (10 mg total) by mouth daily. 03/22/16  Yes Arnetha Courser, MD  Multiple Vitamins-Minerals (MULTIVITAMIN ADULT PO) Take by mouth.   Yes Historical Provider, MD  Omega-3 Fatty Acids (FISH OIL) 1200 MG CAPS Take by mouth daily.   Yes Historical Provider, MD  timolol (TIMOPTIC) 0.5 % ophthalmic solution Place 1 drop into the right eye 2 (two) times daily.  04/24/15  Yes Historical Provider, MD  ACCU-CHEK SOFTCLIX LANCETS lancets Use as instructed; check once a day; H40.1131 05/08/16   Arnetha Courser, MD  glucose blood (ACCU-CHEK AVIVA PLUS) test strip Use as instructed, check once a day; H40.1131 05/08/16   Arnetha Courser, MD  ipratropium (ATROVENT) 0.03 % nasal spray Place 2 sprays into both nostrils 3 (three) times daily. Patient taking differently: Place 2 sprays into both nostrils daily as needed.  12/21/15   Arnetha Courser, MD      VITAL SIGNS:  Blood pressure (!) 136/40, pulse 80, resp. rate 17, height 5' (1.524 m), weight 59 kg (130 lb), SpO2 99 %.  PHYSICAL EXAMINATION:  GENERAL:  82 y.o.-year-old patient lying in the bed with no acute distress.  EYES: Pupils equal, round, reactive to light and accommodation. No scleral icterus. Extraocular muscles intact.  HEENT: Head atraumatic, normocephalic. Oropharynx and nasopharynx clear.  NECK:  Supple, no jugular venous distention. No thyroid enlargement, no tenderness.  LUNGS: Normal breath sounds bilaterally, no wheezing, rales,rhonchi or crepitation. No use of accessory muscles of respiration.  CARDIOVASCULAR: S1, S2 normal. No murmurs, rubs, or gallops.  ABDOMEN: Soft, nontender, nondistended. Bowel sounds present. No organomegaly or mass.  EXTREMITIES: No pedal edema, cyanosis, or clubbing.  NEUROLOGIC: Cranial nerves II through XII are intact. Muscle strength 5/5 in all extremities. Sensation intact.  Gait not checked.  PSYCHIATRIC: The patient is alert and oriented x 3.  SKIN: No obvious rash, lesion, or ulcer.   LABORATORY PANEL:   CBC  Recent Labs Lab 05/24/16 0817  WBC 12.7*  HGB 10.7*  HCT 32.3*  PLT 188   ------------------------------------------------------------------------------------------------------------------  Chemistries   Recent Labs Lab 05/24/16 0817  NA 136  K 3.8  CL 105  CO2 25  GLUCOSE 130*  BUN 17  CREATININE 0.71  CALCIUM 9.0   ------------------------------------------------------------------------------------------------------------------  Cardiac Enzymes  Recent Labs Lab 05/24/16 0817  TROPONINI <0.03   ------------------------------------------------------------------------------------------------------------------  RADIOLOGY:  Dg Chest 2 View  Result Date: 05/24/2016 CLINICAL DATA:  Chest pain; hypertension EXAM: CHEST  2 VIEW COMPARISON:  June 27, 2010 FINDINGS: There is slight scarring in the left base. There is no edema or consolidation. Heart is upper normal in size with pulmonary vascularity within normal limits. There is atherosclerotic calcification in the aorta. There is degenerative change in the thoracic spine. Bones are osteoporotic. IMPRESSION: Slight scarring left base. No edema or consolidation. Aortic  atherosclerosis. Bones osteoporotic. Electronically Signed   By: Lowella Grip III M.D.   On: 05/24/2016 09:28    EKG:   Normal sinus rhythm, incomplete left bundle-branch block, LVH IMPRESSION AND PLAN:   Sydney Mcdaniel  is a 82 y.o. female with a known history of Hypertension, B12 deficiency anemia follows with Dr. Mike Gip, glaucoma, hyperlipidemia comes to the emergency room after she had not been feeling well since she woke up this morning. She complained of some chest tightness to the daughter called EMS. She was checked out by EMS vitals stable however when patient tried to stand up she became very  diaphoretic and PL was about to pass out.  1. Near syncope/recurrent syncope appears due to orthostatic hypotension -Admit to telemetry floor -Cycle cardiac enzymes 3 -Orthostatic vitals every 8 -Patient clinically does not appear dehydrated I will hold off on IV fluids -Resume home meds -Patient has had echo in October 2017 which showed EF of 55-60%, nuclear stress test essentially was negative for ischemia, carotid Doppler showed 49-50% stenosis follows with vascular surgery -Patient also had Holter monitor did not show any significant arrhythmias this was done as outpatient -Spoke with cardiology Dr End who will see patient in consultation  2. Hypertension continue lisinopril and low dependent  3. Hypothyroidism continue Synthroid  4. Hyperlipidemia on statins  5. Glaucoma continue eyedrops  Discussed with patient and daughter  All the records are reviewed and case discussed with ED provider. Management plans discussed with the patient, family and they are in agreement.  CODE STATUS: Full  TOTAL TIME TAKING CARE OF THIS PATIENT: 62s.    Sydney Mcdaniel M.D on 05/24/2016 at 10:59 AM  Between 7am to 6pm - Pager - [phone]  After 6pm go to [email or web address] - password [email or web address] [facility]  Office  [phone]  CC: Primary care physician; Enid Derry, MD

## 2016-05-24 NOTE — Care Management Obs Status (Signed)
[facility] NOTIFICATION   Patient Details  Name: Sydney Mcdaniel MRN: [id number] Date of Birth: 05-05-1925   Medicare Observation Status Notification Given:  Yes    Beau Fanny, RN 05/24/2016, 10:43 AM

## 2016-05-24 NOTE — ED Notes (Signed)
Patient transported to X-ray 

## 2016-05-24 NOTE — ED Provider Notes (Signed)
[facility] Emergency Department Provider Note        Time seen: ----------------------------------------- 8:10 AM on 05/24/2016 -----------------------------------------    I have reviewed the triage vital signs and the nursing notes.   HISTORY  Chief Complaint No chief complaint on file.    HPI Sydney Mcdaniel is a 82 y.o. female who presents to the ER after a possible syncopal event at home. Patient was recently seen for same, was noted to be orthostatic when evaluated by paramedics. She has no complaints at this time but states she has recently had diarrhea. She is sore across her upper back and shoulders. She denies recent illness or other complaints at this time. The event occurred this morning.   Past Medical History:  Diagnosis Date  . Allergy   . Anemia   . Cataract   . Glaucoma   . H/O: hysterectomy    Total  . Hyperlipidemia   . Hypertension   . Hypothyroidism   . Impaired fasting glucose   . Lichen sclerosus   . Osteoporosis    Hips    Patient Active Problem List   Diagnosis Date Noted  . Primary open-angle glaucoma, bilateral, mild stage 05/08/2016  . Labile blood glucose 04/27/2016  . Syncope 03/22/2016  . Bladder prolapse, female, acquired 02/25/2016  . Urinary tract infection 02/25/2016  . 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  . B12 deficiency 01/11/2016  . Anemia 01/10/2016  . CLL (chronic lymphocytic leukemia) ([place]) 01/10/2016  . Hyperkalemia 12/22/2015  . 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 12/21/2015  . Bradycardia 12/21/2015  . Lymphocytosis 06/25/2015  . Preventative health care 06/22/2015  . Impaired fasting glucose 06/22/2015  . Medication monitoring encounter 06/22/2015  . Nocturia 06/22/2015  . Carotid atherosclerosis 06/22/2015  . Essential hypertension, benign 12/22/2014  . Hypercholesteremia 12/22/2014  . Hypothyroidism 12/22/2014    Past  Surgical History:  Procedure Laterality Date  . APPENDECTOMY    . EYE SURGERY     Glaucoma  . TOTAL ABDOMINAL HYSTERECTOMY      Allergies Alphagan [brimonidine] and Pravachol [pravastatin sodium]  Social History Social History  Substance Use Topics  . Smoking status: Never Smoker  . Smokeless tobacco: Never Used  . Alcohol use No    Review of Systems Constitutional: Negative for fever. Cardiovascular: Negative for chest pain. Respiratory: Negative for shortness of breath. Gastrointestinal: Negative for abdominal pain, Positive for diarrhea Genitourinary: Negative for dysuria. Musculoskeletal: Positive for back pain Skin: Negative for rash. Neurological: Negative for headaches, focal weakness or numbness.  10-point ROS otherwise negative.  ____________________________________________   PHYSICAL EXAM:  VITAL SIGNS: ED Triage Vitals  Enc Vitals Group     BP      Pulse      Resp      Temp      Temp src      SpO2      Weight      Height      Head Circumference      Peak Flow      Pain Score      Pain Loc      Pain Edu?      Excl. in [place]?     Constitutional: Alert and oriented. Well appearing and in no distress. Eyes: Conjunctivae are normal. PERRL. Normal extraocular movements. ENT   Head: Normocephalic and atraumatic.   Nose: No congestion/rhinnorhea.   Mouth/Throat: Mucous membranes are moist.  Neck: No stridor. Cardiovascular: Normal rate, regular rhythm. No murmurs, rubs, or gallops. Respiratory: Normal respiratory effort without tachypnea nor retractions. Breath sounds are clear and equal bilaterally. No wheezes/rales/rhonchi. Gastrointestinal: Soft and nontender. Normal bowel sounds Musculoskeletal: Nontender with normal range of motion in all extremities. No lower extremity tenderness nor edema. Neurologic:  Normal speech and language. No gross focal neurologic deficits are appreciated.  Skin:  Skin is warm, dry and intact. No rash  noted. Psychiatric: Mood and affect are normal. Speech and behavior are normal.  ____________________________________________  EKG: Interpreted by me.Sinus rhythm rate of 71 bpm, prolonged PR interval, wide QRS, long QT  ____________________________________________  ED COURSE:  Pertinent labs & imaging results that were available during my care of the patient were reviewed by me and considered in my medical decision making (see chart for details). Clinical Course   Patient presents to ER with syncope or near-syncope which apparently is orthostatic related. We will assess with basic labs and imaging if needed. He will receive IV fluids.  Procedures ____________________________________________   LABS (pertinent positives/negatives)  Labs Reviewed  BASIC METABOLIC PANEL - Abnormal; Notable for the following:       Result Value   Glucose, Bld 130 (*)    All other components within normal limits  CBC - Abnormal; Notable for the following:    WBC 12.7 (*)    RBC 3.53 (*)    Hemoglobin 10.7 (*)    HCT 32.3 (*)    All other components within normal limits  TROPONIN I  URINALYSIS COMPLETEWITH MICROSCOPIC ([facility] ONLY)    RADIOLOGY  Chest x-ray IMPRESSION: Slight scarring left base. No edema or consolidation. Aortic atherosclerosis. Bones osteoporotic. ____________________________________________  FINAL ASSESSMENT AND PLAN  Syncope  Plan: Patient with labs and imaging as dictated above. I have discussed with her primary care doctor who agrees admission is reasonable. There is been no specific etiology for syncope discovered. She has had negative stress test and a negative Holter monitor. I am concerned she may need pacemaker placement. I will discuss with the hospitalist for admission.   Earleen Newport, MD   Note: This dictation was prepared with Dragon dictation. Any transcriptional errors that result from this process are unintentional    Earleen Newport,  MD 05/24/16 1008

## 2016-05-24 NOTE — Consult Note (Signed)
Cardiology Consultation Note  Patient ID: Sydney Mcdaniel, MRN: [id number], DOB/AGE: February 11, 1925 82 y.o. Admit date: 05/24/2016   Date of Consult: 05/24/2016 Primary Physician: Enid Derry, MD Primary Cardiologist: Dr. Yvone Neu, MD Requesting Physician: Dr. Posey Pronto, MD  Chief Complaint: Chest pain/near syncope Reason for Consult: Same  HPI: 82 y.o. female with h/o multiple recent near syncopal to syncopal episodes dating back to early September 2017 with unrevealing workup to date felt to be related to dehydration, heat, and orthostatic hypotension. Also with history of HTN, HLD, and anemia who presented to [facility] today via EMS with substernal chest pain and dizziness upon standing.   Patient last seen by Dr. Yvone Neu on 03/29/2016 for evaluation of near syncope in early September. Back in early September the patient went out to get the mail on a hot day and decided to work some in her yard. She apparently began to feel overheated and went inside to drink some [facility]. Prior to drinking this soda she suffered an apparent syncopal episode. Her daughter had recently left her house for the day. The patient's Medical Alert began to alarm bringing her to. She initially followed up with PCP who referred the patient to cardiology. To date she has had an echo on 04/17/2016 that showed an EF of 55-60%, normal wall motion, normal LV diastolic function, mild MR/AI, left atrium was normal in size, normal RV size and systolic function, PASP normal. Lexiscan Myoview on 04/10/2016 that showed a small defect of mild severity present in the apex location felt to be breast attenuation artifact. Overall, low-risk study. 48 hour Holter monitor in early November that showed an overall sinus rhythm with heart rate ganging from 44-85 bpm with an average heart rate of 60 bpm. First degree AV block was noted with a longest R-R interval of 1.44 seconds. There were 274 PACs, 48 atrial couplets, 2 short atrial runs consisting of 4  beats at 87 bpm, and 4 beats at 95 bpm, 28 isolated PVCs, no evidence of Afib. Carotid ultrasound without significant stenosis bilaterally. She had another episode of syncope in late October in which she presented to the ED. This episode was associated with severe abdominal pain and diarrhea. She was felt to be vasovagal. She had been in her usual state of health until this morning around 4:30 AM when she could no longer sleep and had some diarrhea. She got up and sat in a chair. Around 6 AM she developed substernal chest pressure that lasted until she got to the hospital and was given Toradol. Pain upon her arrival to the hospital had moved from her chest to her posterior shoulders. While at home with her chest pain, she called a friend over as she was not feeling well. They called EMS. Upon EMS arrival the patient was noted to have a SBP in the 170's mmHg sitting. This was rechecked sitting and still found to be in the [id number] mmHg systolic. Orthostatic vital signs were not obtained in the field. Patient stood up while EMS was present and she felt dizzy. No worsening of chest pain. No associated SOB, nausea, vomiting, or palpitations.   Upon the patient's arrival to [facility] they were found to have troponin negative x 1, SCr 0.71, K+ 3.8, Na 136, wbc 12.7, hgb 10.7, plt 188, ua with specific gravity of 1.010. ECG as below, CXR showed slight scarring of the left base without edema or consolidation. She was given IV fluids via EMS and a second bag of IV fluids was  hung in the ED which has has nearly completed. She is asymptomatic at this time.    Past Medical History:  Diagnosis Date  . Allergy   . Anemia   . Cataract   . Glaucoma   . H/O: hysterectomy    Total  . Hyperlipidemia   . Hypertension   . Hypothyroidism   . Impaired fasting glucose   . Lichen sclerosus   . Osteoporosis    Hips      Most Recent Cardiac Studies: As above   Surgical History:  Past Surgical History:  Procedure Laterality  Date  . APPENDECTOMY    . EYE SURGERY     Glaucoma  . TOTAL ABDOMINAL HYSTERECTOMY       Home Meds: Prior to Admission medications   Medication Sig Start Date End Date Taking? Authorizing Provider  amLODipine (NORVASC) 5 MG tablet Take 1 tablet (5 mg total) by mouth daily. 03/29/16  Yes Arnetha Courser, MD  aspirin 81 MG tablet Take 81 mg by mouth daily.   Yes Historical Provider, MD  atorvastatin (LIPITOR) 20 MG tablet Take 1 tablet (20 mg total) by mouth at bedtime. 04/12/16  Yes Arnetha Courser, MD  Calcium Carb-Cholecalciferol (CALCIUM 600 + D PO) Take by mouth daily.   Yes Historical Provider, MD  latanoprost (XALATAN) 0.005 % ophthalmic solution Place 1 drop into the right eye at bedtime.  04/24/15  Yes Historical Provider, MD  levothyroxine (SYNTHROID, LEVOTHROID) 75 MCG tablet One by mouth on Mondays, Wednesdays, and Fridays 03/23/16  Yes Arnetha Courser, MD  levothyroxine (SYNTHROID, LEVOTHROID) 88 MCG tablet One by mouth on Tuesdays, Thursdays, Saturdays, and Sundays 03/23/16  Yes Arnetha Courser, MD  lisinopril (PRINIVIL,ZESTRIL) 10 MG tablet Take 1 tablet (10 mg total) by mouth daily. 03/22/16  Yes Arnetha Courser, MD  Multiple Vitamins-Minerals (MULTIVITAMIN ADULT PO) Take by mouth.   Yes Historical Provider, MD  Omega-3 Fatty Acids (FISH OIL) 1200 MG CAPS Take by mouth daily.   Yes Historical Provider, MD  timolol (TIMOPTIC) 0.5 % ophthalmic solution Place 1 drop into the right eye 2 (two) times daily.  04/24/15  Yes Historical Provider, MD  ACCU-CHEK SOFTCLIX LANCETS lancets Use as instructed; check once a day; H40.1131 05/08/16   Arnetha Courser, MD  glucose blood (ACCU-CHEK AVIVA PLUS) test strip Use as instructed, check once a day; H40.1131 05/08/16   Arnetha Courser, MD  ipratropium (ATROVENT) 0.03 % nasal spray Place 2 sprays into both nostrils 3 (three) times daily. Patient taking differently: Place 2 sprays into both nostrils daily as needed.  12/21/15   Arnetha Courser, MD     Inpatient Medications:     Allergies:  Allergies  Allergen Reactions  . Alphagan [Brimonidine]   . Pravachol [Pravastatin Sodium]     Social History   Social History  . Marital status: Married    Spouse name: N/A  . Number of children: N/A  . Years of education: N/A   Occupational History  . Not on file.   Social History Main Topics  . Smoking status: Never Smoker  . Smokeless tobacco: Never Used  . Alcohol use No  . Drug use: No  . Sexual activity: No   Other Topics Concern  . Not on file   Social History Narrative  . No narrative on file     Family History  Problem Relation Age of Onset  . Heart attack Mother   . Glaucoma Mother   .  Heart attack Father   . Parkinson's disease Brother   . Heart attack Brother      Review of Systems: Review of Systems  Constitutional: Positive for malaise/fatigue. Negative for chills, diaphoresis, fever and weight loss.  HENT: Negative for congestion.   Eyes: Negative for discharge and redness.  Respiratory: Negative for cough, hemoptysis, sputum production, shortness of breath and wheezing.   Cardiovascular: Positive for chest pain. Negative for palpitations, orthopnea, claudication, leg swelling and PND.  Gastrointestinal: Negative for abdominal pain, blood in stool, heartburn, melena, nausea and vomiting.  Genitourinary: Negative for hematuria.  Musculoskeletal: Negative for falls and myalgias.  Skin: Negative for rash.  Neurological: Positive for dizziness and weakness. Negative for tingling, tremors, sensory change, speech change, focal weakness and loss of consciousness.  Endo/Heme/Allergies: Does not bruise/bleed easily.  Psychiatric/Behavioral: Negative for substance abuse. The patient is not nervous/anxious.   All other systems reviewed and are negative.   Labs:  Recent Labs  05/24/16 0817  TROPONINI <0.03   Lab Results  Component Value Date   WBC 12.7 (H) 05/24/2016   HGB 10.7 (L) 05/24/2016    HCT 32.3 (L) 05/24/2016   MCV 91.5 05/24/2016   PLT 188 05/24/2016     Recent Labs Lab 05/24/16 0817  NA 136  K 3.8  CL 105  CO2 25  BUN 17  CREATININE 0.71  CALCIUM 9.0  GLUCOSE 130*   Lab Results  Component Value Date   CHOL 128 12/21/2015   HDL 62 12/21/2015   LDLCALC 44 12/21/2015   TRIG 111 12/21/2015   No results found for: DDIMER  Radiology/Studies:  Dg Chest 2 View  Result Date: 05/24/2016 CLINICAL DATA:  Chest pain; hypertension EXAM: CHEST  2 VIEW COMPARISON:  June 27, 2010 FINDINGS: There is slight scarring in the left base. There is no edema or consolidation. Heart is upper normal in size with pulmonary vascularity within normal limits. There is atherosclerotic calcification in the aorta. There is degenerative change in the thoracic spine. Bones are osteoporotic. IMPRESSION: Slight scarring left base. No edema or consolidation. Aortic atherosclerosis. Bones osteoporotic. Electronically Signed   By: Lowella Grip III M.D.   On: 05/24/2016 09:28    EKG: Interpreted by me showed: NSR, 71 bpm, 1st degree AV block, LBBB (old) Telemetry: Interpreted by me showed: NSR, first degree AV block, 70's bpm  Weights: Filed Weights   05/24/16 0818  Weight: 130 lb (59 kg)     Physical Exam: Blood pressure (!) 139/46, pulse 80, resp. rate 19, height 5' (1.524 m), weight 130 lb (59 kg), SpO2 100 %. Body mass index is 25.39 kg/m. General: Well developed, well nourished, in no acute distress. Head: Normocephalic, atraumatic, sclera non-icteric, no xanthomas, nares are without discharge.  Neck: Negative for carotid bruits. JVD not elevated. Lungs: Clear bilaterally to auscultation without wheezes, rales, or rhonchi. Breathing is unlabored. Heart: RRR with S1 S2. No murmurs, rubs, or gallops appreciated. Abdomen: Soft, non-tender, non-distended with normoactive bowel sounds. No hepatomegaly. No rebound/guarding. No obvious abdominal masses. Msk:  Strength and tone  appear normal for age. Extremities: No clubbing or cyanosis. No edema. Distal pedal pulses are 2+ and equal bilaterally. Neuro: Alert and oriented X 3. No facial asymmetry. No focal deficit. Moves all extremities spontaneously. Psych:  Responds to questions appropriately with a normal affect.    Assessment and Plan:  Principal Problem:   Chest pain Active Problems:   Syncope, near   Essential hypertension, benign   Hypercholesteremia  Carotid atherosclerosis   Hypothyroidism    1. Chest pain: -Currently symptom-free -Continue to cycle troponin to rule out -Recent nuclear stress test low risk as above, though did show mild defect along the apex felt to be breast attenuation artifact -Unlikely to be a primary ischemic event -Should her symptoms recur soul consider optimization of medication vs outpatient cardiac cath  2. Near syncope: -Patient with history of presumed vasovagal syncope and orthostatic hypotension -It is unclear if she had orthostatic vital signs taken in the field as there are conflicting reports -Nonetheless, she has now received 2 L of IV fluids prior to orthostatic vital signs being checked upon arrival -Should her symptoms resolve with positional changes then she can likely be monitored overnight and discharged on 11/30 -History does not present as a primary electrical issue, though she does have conduction delay with her left bundle  -Consider outpatient event monitoring if symptoms recur -Has had workup as above  3. Remaining per IM   Signed, Christell Faith, PA-C [facility] Pager: [phone] 05/24/2016, 11:59 AM

## 2016-05-24 NOTE — Evaluation (Signed)
Physical Therapy Evaluation Patient Details Name: Sydney Mcdaniel MRN: [id number] DOB: Dec 28, 1924 Today's Date: 05/24/2016   History of Present Illness  Pt is an 82 y.o. female presenting to hospital with chest tightness and possible syncopal event at home.  Paramedics reporting pt orthostatic.  Pt admitted for observation.  PMH includes anemia, htn, and syncope.  Clinical Impression  Prior to hospital admission, pt was modified independent with functional mobility ("furniture cruised" within home and had assist of family and SPC when in community).  Pt lives alone but daughter checks in on pt each morning and lives close by.  Currently pt is SBA supine to/from sit and CGA with transfers and ambulation 60 feet with SPC.  Pt appearing more cautious with gait than typically but no adverse symptoms reported by pt (including any dizziness/lightheadedness).  Pt would benefit from skilled PT to address noted impairments and functional limitations.  Recommend pt discharge to home with 24/7 assist initially for functional mobility for safety (pt's daughter reports they are able to provide that initially).    Follow Up Recommendations Home health PT;Supervision/Assistance - 24 hour    Equipment Recommendations   (pt already owns [facility])    Recommendations for Other Services       Precautions / Restrictions Precautions Precautions: Fall Restrictions Weight Bearing Restrictions: No      Mobility  Bed Mobility Overal bed mobility: Needs Assistance Bed Mobility: Supine to Sit;Sit to Supine     Supine to sit: Supervision;HOB elevated Sit to supine: Supervision;HOB elevated   General bed mobility comments: increased effort to perform on pt's own  Transfers Overall transfer level: Needs assistance Equipment used: Straight cane Transfers: Sit to/from Stand Sit to Stand: Min guard         General transfer comment: increased effort to stand and pt holding onto [facility] in R UE and holding  bedrail with L UE initially; no loss of balance noted  Ambulation/Gait Ambulation/Gait assistance: Min guard Ambulation Distance (Feet): 60 Feet Assistive device: Straight cane   Gait velocity: decreased   General Gait Details: decreased B step length/foot clearance/heelstrike; pt appearing more cautious with mobility but no loss of balance noted  Stairs            Wheelchair Mobility    Modified Rankin (Stroke Patients Only)       Balance Overall balance assessment: Needs assistance (H/o falls d/t passing out) Sitting-balance support: No upper extremity supported;Feet supported Sitting balance-Leahy Scale: Good     Standing balance support: Single extremity supported;During functional activity (R UE support with SPC) Standing balance-Leahy Scale: Good                               Pertinent Vitals/Pain Pain Assessment: No/denies pain  HR 80-86 bpm during session. BP 152/46 beginning of session.    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 and uses either a cane and/or holds  onto someones elbow when out in the community (pt always has someone with when ambulating in community)     Hand Dominance        Extremity/Trunk Assessment   Upper Extremity Assessment: Generalized weakness           Lower Extremity Assessment: Generalized weakness         Communication   Communication: No difficulties   Cognition Arousal/Alertness: Awake/alert Behavior During Therapy: WFL for tasks assessed/performed Overall Cognitive Status: Within Functional Limits for tasks assessed                      General Comments General comments (skin integrity, edema, etc.): Pt's daughter present for entire session.  Nursing cleared pt for participation in physical therapy.  Pt agreeable to PT session.    Exercises Total Joint Exercises Ankle Circles/Pumps: AROM;Strengthening;Both;10 reps;Supine Short Arc Quad: AROM;Strengthening;Both;10 reps;Supine Heel Slides: AROM;Strengthening;Both;10 reps;Supine Hip ABduction/ADduction: AROM;Strengthening;Both;10 reps;Supine  Vc's required for technique for ex's.   Assessment/Plan    PT Assessment Patient needs continued PT services  PT Problem List Decreased balance;Decreased mobility          PT Treatment Interventions DME instruction;Gait training;Stair training;Functional mobility training;Therapeutic activities;Therapeutic exercise;Balance training;Patient/family education    PT Goals (Current goals can be found in the Care Plan section)  Acute Rehab PT Goals Patient Stated Goal: to go home PT Goal Formulation: With patient Time For Goal Achievement: 06/07/16 Potential to Achieve Goals: Good    Frequency Min 2X/week   Barriers to discharge        Co-evaluation               End of Session Equipment Utilized During Treatment: Gait belt Activity Tolerance: Patient tolerated treatment well Patient left: in bed;with call bell/phone within reach;with bed alarm set;with family/visitor present Nurse Communication: Mobility status;Precautions    Functional Assessment Tool Used: AM-PAC without stairs Functional Limitation: Mobility: Walking and moving around Mobility: Walking and Moving Around Current Status [id number]): At least 40 percent but less than 60 percent impaired, limited or restricted Mobility: Walking and Moving Around Goal  Status [id number]): At least 1 percent but less than 20 percent impaired, limited or restricted    Time: 1600-1620 PT Time Calculation (min) (ACUTE ONLY): 20 min   Charges:   PT Evaluation $PT Eval Low Complexity: 1 Procedure PT Treatments $Therapeutic Exercise: 8-22 mins   PT G Codes:   PT G-Codes **NOT FOR INPATIENT CLASS** Functional Assessment Tool Used: AM-PAC without stairs Functional Limitation: Mobility: Walking and moving around Mobility: Walking and Moving Around Current Status [id number]): At least 40 percent but less than 60 percent impaired, limited or restricted Mobility: Walking and Moving Around Goal Status [phone]): At least 1 percent but less than 20 percent impaired, limited or restricted    Leitha Bleak 05/24/2016, 4:32 PM Leitha Bleak, [place]

## 2016-05-25 ENCOUNTER — Telehealth: Payer: Self-pay | Admitting: Physician Assistant

## 2016-05-25 DIAGNOSIS — E039 Hypothyroidism, unspecified: Secondary | ICD-10-CM | POA: Diagnosis not present

## 2016-05-25 DIAGNOSIS — R55 Syncope and collapse: Secondary | ICD-10-CM

## 2016-05-25 DIAGNOSIS — I10 Essential (primary) hypertension: Secondary | ICD-10-CM | POA: Diagnosis not present

## 2016-05-25 DIAGNOSIS — R0789 Other chest pain: Secondary | ICD-10-CM | POA: Diagnosis not present

## 2016-05-25 DIAGNOSIS — I951 Orthostatic hypotension: Secondary | ICD-10-CM | POA: Diagnosis not present

## 2016-05-25 LAB — TROPONIN I: Troponin I: <0.03 ng/mL (ref <0.03)

## 2016-05-25 NOTE — Telephone Encounter (Signed)
Spoke with patients daughter and let her know that they wanted her mother to wear a event monitor. Placed order for monitor and let her know that they would be calling to verify mailing address prior to sending. Also let her know to please give [place] a call if they have any questions about the monitor or discharge instructions. She verbalized understanding and had no further questions at this time.

## 2016-05-25 NOTE — Discharge Instructions (Signed)
Blood pressure medications are stopped, check Blood pressure daily and if systolic BP > 0000000, call cardiology clinic- you may need a small dose of medication.

## 2016-05-25 NOTE — Progress Notes (Signed)
Dr Marcille Blanco notified of patient's most recent blood pressure and her pause and EKG changes on the monitor. No new orders at this time.

## 2016-05-25 NOTE — Care Management (Signed)
Patient placed in observation for  near syncope and orthostatic hypotension and some chest tightness. Blood pressure medications on hold.  Patient lives alone but her daughter lives a couple of houses over. She uses a cane and is independent in her alds.  Physical therapy has recommended home health physical therapy.  May also benefit from home health nurse.  Patient and daughter will consider.

## 2016-05-25 NOTE — Progress Notes (Signed)
Patient Name: Sydney Mcdaniel Date of Encounter: 05/25/2016  Primary Cardiologist: [facility] Problem List     Principal Problem:   Chest pain Active Problems:   Syncope, near   Essential hypertension, benign   Hypercholesteremia   Carotid atherosclerosis   Hypothyroidism     Subjective   No further chest pain. No dizziness or presyncope with positional changes in the evening or overnight. BP soft early this morning in the 99991111 mmHg systolic. Held BP medications. Telemetry with rhythm that appears to be sinus with 1st degree AV block, PACS and PJCs. Longest pause of 2.16 seconds. Orthostatic vitals signs on 11/29 s/p 2 L of IV fluids normal. Troponin negative x 4.   Inpatient Medications    Scheduled Meds: . aspirin EC  81 mg Oral Daily  . atorvastatin  20 mg Oral QHS  . calcium-vitamin D  1 tablet Oral Daily  . enoxaparin (LOVENOX) injection  40 mg Subcutaneous Q24H  . latanoprost  1 drop Right Eye QHS  . [START ON 05/26/2016] levothyroxine  75 mcg Oral Q M,W,F  . levothyroxine  88 mcg Oral Q T,Th,S,Su  . multivitamin with minerals  1 tablet Oral Daily  . omega-3 acid ethyl esters  1 g Oral Daily  . timolol  1 drop Right Eye BID   Continuous Infusions:  PRN Meds: acetaminophen **OR** acetaminophen, ondansetron **OR** ondansetron (ZOFRAN) IV   Vital Signs    Vitals:   05/24/16 1721 05/24/16 1723 05/24/16 1914 05/25/16 0456  BP: (!) 129/33 (!) 127/44 (!) 127/40 (!) 82/40  Pulse: 76 79 78   Resp:   16 15  Temp:    98.4 F (36.9 C)  TempSrc:    Oral  SpO2:   98%   Weight:      Height:        Intake/Output Summary (Last 24 hours) at 05/25/16 0755 Last data filed at 05/25/16 0200  Gross per 24 hour  Intake              340 ml  Output              300 ml  Net               40 ml   Filed Weights   05/24/16 0818 05/24/16 1300  Weight: 130 lb (59 kg) 130 lb (59 kg)    Physical Exam    GEN: Well nourished, well developed, in no acute distress.    HEENT: Grossly normal.  Neck: Supple, no JVD, carotid bruits, or masses. Cardiac: RRR, no murmurs, rubs, or gallops. No clubbing, cyanosis, edema.  Radials/DP/PT 2+ and equal bilaterally.  Respiratory:  Respirations regular and unlabored, clear to auscultation bilaterally. GI: Soft, nontender, nondistended, BS + x 4. MS: no deformity or atrophy. Skin: warm and dry, no rash. Neuro:  Strength and sensation are intact. Psych: AAOx3.  Normal affect.  Labs    CBC  Recent Labs  05/24/16 0817  WBC 12.7*  HGB 10.7*  HCT 32.3*  MCV 91.5  PLT 0000000   Basic Metabolic Panel  Recent Labs  05/24/16 0817  NA 136  K 3.8  CL 105  CO2 25  GLUCOSE 130*  BUN 17  CREATININE 0.71  CALCIUM 9.0   Liver Function Tests No results for input(s): AST, ALT, ALKPHOS, BILITOT, PROT, ALBUMIN in the last 72 hours. No results for input(s): LIPASE, AMYLASE in the last 72 hours. Cardiac Enzymes  Recent Labs  05/24/16  1418 05/24/16 1950 05/25/16 0202  TROPONINI <0.03 <0.03 <0.03   BNP Invalid input(s): POCBNP D-Dimer No results for input(s): DDIMER in the last 72 hours. Hemoglobin A1C No results for input(s): HGBA1C in the last 72 hours. Fasting Lipid Panel No results for input(s): CHOL, HDL, LDLCALC, TRIG, CHOLHDL, LDLDIRECT in the last 72 hours. Thyroid Function Tests No results for input(s): TSH, T4TOTAL, T3FREE, THYROIDAB in the last 72 hours.  Invalid input(s): FREET3  Telemetry    sinus with 1st degree AV block, PACS and PJCs. Longest pause of 2.16 seconds - Personally Reviewed  ECG    Ordered to evaluate rhythm - Personally Reviewed  Radiology    Dg Chest 2 View  Result Date: 05/24/2016 CLINICAL DATA:  Chest pain; hypertension EXAM: CHEST  2 VIEW COMPARISON:  June 27, 2010 FINDINGS: There is slight scarring in the left base. There is no edema or consolidation. Heart is upper normal in size with pulmonary vascularity within normal limits. There is atherosclerotic  calcification in the aorta. There is degenerative change in the thoracic spine. Bones are osteoporotic. IMPRESSION: Slight scarring left base. No edema or consolidation. Aortic atherosclerosis. Bones osteoporotic. Electronically Signed   By: Lowella Grip III M.D.   On: 05/24/2016 09:28    Cardiac Studies   None this admission - see consult for prior work up including echo, nuclear stress test, and Holter monitor  Patient Profile     82 y.o. female with history of multiple recent near syncopal to syncopal episodes dating back to early September 2017 with unrevealing workup to date felt to be related to dehydration, heat, and orthostatic hypotension. Also with history of HTN, HLD, and anemia who presented to [facility] on 11/29 via EMS with substernal chest pain and dizziness upon standing.  Assessment & Plan    1. Chest pain: -Resolved -Has ruled out -If she has recurrent chest pain consider outpatient cardiac catheterization to rule out false-negative stress test  2. Near syncope: -Orthostatic vitals signs negative s/p 2 L of IV fluids -BP soft in the 99991111 systolic this morning, held BP medications -Will have RN check BP at this time, if remains [id number] to [id number] systolic will give [id number] mL NS bolus -Cannot rule out episodes from possible pause given 2.16 second pause seen on telemetry with frequent PACS/PJCs -Will obtain ekg to evaluate rhythm further -Will need 30-day event monitor -BP precludes usage of beta blocker at this time -Has been seen by PT -Prior workup per consult note  3. Atrial ectopy: -Obtain 12-lead EKG as above -Unable to add beta blocker 2/2 hypotension at this time -Cannot rule out possible etiology of her dizziness/pre-syncope -30-day event monitor as above  4. Remaining per IM  Signed, Christell Faith, PA-C [facility] Pager: [phone] 05/25/2016, 7:55 AM

## 2016-05-25 NOTE — Progress Notes (Signed)
I have not seen the patient, but have reviewed the notes form RD and MA   The pause today while sleeping is not an indication for pacing.  I will defer further evaluation ( and charges) to repeat consultation . I agree the issues seem likley related to her labile BP 160's and 80s

## 2016-05-25 NOTE — Progress Notes (Signed)
   Notified by RN patient with a 4.2 second pause while sleeping this afternoon. Discussed with MD. Will have Dr. Caryl Comes, MD see today for evaluation of possible PPM.

## 2016-05-25 NOTE — Discharge Summary (Signed)
[place] [facility] [place] NAME: Sydney Mcdaniel    MR#:  [id number]  DATE OF BIRTH:  23-Jul-1924  DATE OF ADMISSION:  05/24/2016 ADMITTING PHYSICIAN: Fritzi Mandes, MD  DATE OF DISCHARGE: 05/25/2016  PRIMARY CARE PHYSICIAN: Enid Derry, MD    ADMISSION DIAGNOSIS:  Syncope and collapse [R55] Hypotension  DISCHARGE DIAGNOSIS:  Principal Problem:   Chest pain Active Problems:   Essential hypertension, benign   Hypercholesteremia   Hypothyroidism   Carotid atherosclerosis   Syncope, near   SECONDARY DIAGNOSIS:   Past Medical History:  Diagnosis Date  . Allergy   . Anemia   . Cataract   . Glaucoma   . H/O: hysterectomy    Total  . Hyperlipidemia   . Hypertension   . Hypothyroidism   . Impaired fasting glucose   . Lichen sclerosus   . Osteoporosis    Hips    HOSPITAL COURSE:   Sydney Mcdaniel  is a 82 y.o. female with a known history of Hypertension, B12 deficiency anemia follows with Dr. Mike Gip, glaucoma, hyperlipidemia comes to the emergency room after she had not been feeling well since she woke up this morning. She complained of some chest tightness to the daughter called EMS. She was checked out by EMS vitals stable however when patient tried to stand up she became very diaphoretic and was about to pass out.  1. Near syncope/recurrent syncope appears due to orthostatic hypotension -Admitted to telemetry floor -Cycle cardiac enzymes 3- remained negative. -Orthostatic vitals every 8- stable after stopping BP meds. -Patient has had echo in October 2017 which showed EF of 55-60%, nuclear stress test essentially was negative for ischemia, carotid Doppler showed 49-50% stenosis follows with vascular surgery -Patient also had Holter monitor did not show any significant arrhythmias this was done as outpatient -On telemetry monitor pt had one pause of 4.2 seconds , but as per Dr. Caryl Comes- she has no indication for PPM and she  need monitor for 1 month. Cardiologist arranged for that.  Also suggested to stop BP meds as she have big fluctuation in BP- keep SBP around 150-160.  2. Hypertension stopped meds, stable.  3. Hypothyroidism continue Synthroid  4. Hyperlipidemia on statins  5. Glaucoma continue eyedrops   DISCHARGE CONDITIONS:   Stable.  CONSULTS OBTAINED:  Treatment Team:  Nelva Bush, MD  DRUG ALLERGIES:   Allergies  Allergen Reactions  . Alphagan [Brimonidine]   . Pravachol [Pravastatin Sodium]     DISCHARGE MEDICATIONS:   Current Discharge Medication List    CONTINUE these medications which have NOT CHANGED   Details  aspirin 81 MG tablet Take 81 mg by mouth daily.    atorvastatin (LIPITOR) 20 MG tablet Take 1 tablet (20 mg total) by mouth at bedtime. Qty: 90 tablet, Refills: 3    Calcium Carb-Cholecalciferol (CALCIUM 600 + D PO) Take by mouth daily.    latanoprost (XALATAN) 0.005 % ophthalmic solution Place 1 drop into the right eye at bedtime.     !! levothyroxine (SYNTHROID, LEVOTHROID) 75 MCG tablet One by mouth on Mondays, Wednesdays, and Fridays Qty: 40 tablet, Refills: 1   Associated Diagnoses: Hypothyroidism, unspecified hypothyroidism type    !! levothyroxine (SYNTHROID, LEVOTHROID) 88 MCG tablet One by mouth on Tuesdays, Thursdays, Saturdays, and Sundays Qty: 52 tablet, Refills: 1    Multiple Vitamins-Minerals (MULTIVITAMIN ADULT PO) Take by mouth.    Omega-3 Fatty Acids (FISH OIL) 1200 MG CAPS Take by mouth daily.  timolol (TIMOPTIC) 0.5 % ophthalmic solution Place 1 drop into the right eye 2 (two) times daily.     ACCU-CHEK SOFTCLIX LANCETS lancets Use as instructed; check once a day; H40.1131 Qty: 100 each, Refills: 12   Associated Diagnoses: Impaired fasting glucose    glucose blood (ACCU-CHEK AVIVA PLUS) test strip Use as instructed, check once a day; H40.1131 Qty: 100 each, Refills: 3   Associated Diagnoses: Impaired fasting glucose     ipratropium (ATROVENT) 0.03 % nasal spray Place 2 sprays into both nostrils 3 (three) times daily. Qty: 90 mL, Refills: 3     !! - Potential duplicate medications found. Please discuss with provider.    STOP taking these medications     amLODipine (NORVASC) 5 MG tablet      lisinopril (PRINIVIL,ZESTRIL) 10 MG tablet          DISCHARGE INSTRUCTIONS:    BP meds stopped, follow with cardiologist. Cardiac monitor is to be arranged by her cardiologist.  If you experience worsening of your admission symptoms, develop shortness of breath, life threatening emergency, suicidal or homicidal thoughts you must seek medical attention immediately by calling 911 or calling your MD immediately  if symptoms less severe.  You Must read complete instructions/literature along with all the possible adverse reactions/side effects for all the Medicines you take and that have been prescribed to you. Take any new Medicines after you have completely understood and accept all the possible adverse reactions/side effects.   Please note  You were cared for by a hospitalist during your hospital stay. If you have any questions about your discharge medications or the care you received while you were in the hospital after you are discharged, you can call the unit and asked to speak with the hospitalist on call if the hospitalist that took care of you is not available. Once you are discharged, your primary care physician will handle any further medical issues. Please note that NO REFILLS for any discharge medications will be authorized once you are discharged, as it is imperative that you return to your primary care physician (or establish a relationship with a primary care physician if you do not have one) for your aftercare needs so that they can reassess your need for medications and monitor your lab values.    Today   CHIEF COMPLAINT:   Chief Complaint  Patient presents with  . Chest Pain  . Near Syncope     HISTORY OF PRESENT ILLNESS:  Sydney Mcdaniel  is a 82 y.o. female with a known history of Hypertension, B12 deficiency anemia follows with Dr. Mike Gip, glaucoma, hyperlipidemia comes to the emergency room after she had not been feeling well since she woke up this morning. She complained of some chest tightness to the daughter called EMS. She was checked out by EMS vitals stable however when patient tried to stand up she became very diaphoretic and pale and was about to pass out. At that time blood pressure readings were taken by EMS which outpatient has orthostatic hypotension. Clinically patient does not appear to be dehydrated. She did not lose consciousness incontinence. No seizure like activity or loss of consciousness was noted by family and EMS. In the emergency room patient is chest pain-free no shortness of breath felt well. She is requesting to go home however given her symptoms we'll admit her for overnight observation Daughter is present in the ER  VITAL SIGNS:  Blood pressure (!) 120/41, pulse 73, temperature 98 F (36.7 C), temperature source  Oral, resp. rate 15, height 5' (1.524 m), weight 59 kg (130 lb), SpO2 96 %.  I/O:   Intake/Output Summary (Last 24 hours) at 05/25/16 1744 Last data filed at 05/25/16 1047  Gross per 24 hour  Intake              580 ml  Output              200 ml  Net              380 ml    PHYSICAL EXAMINATION:  GENERAL:  82 y.o.-year-old patient lying in the bed with no acute distress.  EYES: Pupils equal, round, reactive to light and accommodation. No scleral icterus. Extraocular muscles intact.  HEENT: Head atraumatic, normocephalic. Oropharynx and nasopharynx clear.  NECK:  Supple, no jugular venous distention. No thyroid enlargement, no tenderness.  LUNGS: Normal breath sounds bilaterally, no wheezing, rales,rhonchi or crepitation. No use of accessory muscles of respiration.  CARDIOVASCULAR: S1, S2 normal. No murmurs, rubs, or gallops.  ABDOMEN:  Soft, non-tender, non-distended. Bowel sounds present. No organomegaly or mass.  EXTREMITIES: No pedal edema, cyanosis, or clubbing.  NEUROLOGIC: Cranial nerves II through XII are intact. Muscle strength 5/5 in all extremities. Sensation intact. Gait not checked.  PSYCHIATRIC: The patient is alert and oriented x 3.  SKIN: No obvious rash, lesion, or ulcer.   DATA REVIEW:   CBC  Recent Labs Lab 05/24/16 0817  WBC 12.7*  HGB 10.7*  HCT 32.3*  PLT 188    Chemistries   Recent Labs Lab 05/24/16 0817  NA 136  K 3.8  CL 105  CO2 25  GLUCOSE 130*  BUN 17  CREATININE 0.71  CALCIUM 9.0    Cardiac Enzymes  Recent Labs Lab 05/25/16 0202  TROPONINI <0.03    Microbiology Results  Results for orders placed or performed in visit on 02/25/16  Urine Culture     Status: None   Collection Time: 02/25/16 10:30 AM  Result Value Ref Range Status   Organism ID, Bacteria Multiple organisms present,each less than  Final   Organism ID, Bacteria 10,000 CFU/mL.  Final   Organism ID, Bacteria These organisms,commonly found on external  Final   Organism ID, Bacteria and internal genitalia,are considered colonizers.  Final   Organism ID, Bacteria No further testing performed.  Final    RADIOLOGY:  Dg Chest 2 View  Result Date: 05/24/2016 CLINICAL DATA:  Chest pain; hypertension EXAM: CHEST  2 VIEW COMPARISON:  June 27, 2010 FINDINGS: There is slight scarring in the left base. There is no edema or consolidation. Heart is upper normal in size with pulmonary vascularity within normal limits. There is atherosclerotic calcification in the aorta. There is degenerative change in the thoracic spine. Bones are osteoporotic. IMPRESSION: Slight scarring left base. No edema or consolidation. Aortic atherosclerosis. Bones osteoporotic. Electronically Signed   By: Lowella Grip III M.D.   On: 05/24/2016 09:28    EKG:   Orders placed or performed during the hospital encounter of 05/24/16  . ED  EKG  . ED EKG  . EKG 12-Lead  . EKG 12-Lead      Management plans discussed with the patient, family and they are in agreement.  CODE STATUS:     Code Status Orders        Start     Ordered   05/24/16 1334  Full code  Continuous     05/24/16 1333    Code Status History  Date Active Date Inactive Code Status Order ID Comments User Context   This patient has a current code status but no historical code status.    Advance Directive Documentation   Flowsheet Row Most Recent Value  Type of Advance Directive  Healthcare Power of Attorney  Pre-existing out of facility DNR order (yellow form or pink MOST form)  No data  "MOST" Form in Place?  No data      TOTAL TIME TAKING CARE OF THIS PATIENT: 35 minutes.    Vaughan Basta M.D on 05/25/2016 at 5:44 PM  Between 7am to 6pm - Pager - [phone]  After 6pm go to [email or web address] - password [email or web address] [facility]  Office  [phone]  CC: Primary care physician; Enid Derry, MD   Note: This dictation was prepared with Dragon dictation along with smaller phrase technology. Any transcriptional errors that result from this process are unintentional.

## 2016-05-26 ENCOUNTER — Ambulatory Visit: Payer: Medicare Other

## 2016-05-29 ENCOUNTER — Telehealth: Payer: Self-pay | Admitting: *Deleted

## 2016-05-29 ENCOUNTER — Telehealth: Payer: Self-pay | Admitting: Nurse Practitioner

## 2016-05-29 NOTE — Telephone Encounter (Signed)
-----   Message from Blain Pais sent at 05/29/2016  9:20 AM EST ----- Regarding: tcm/ph 12/19 [name], NP 3:00

## 2016-05-29 NOTE — Telephone Encounter (Signed)
Patient contacted regarding discharge from [facility] on 05/25/2016.  Patient understands to follow up with provider Ignacia Bayley NP on 06/13/16 at 3:00PM at [facility]. Patient understands discharge instructions? Yes Patient understands medications and regiment? Yes Patient understands to bring all medications to this visit? Yes  Spoke with patients daughter [name] per release form and reviewed appointment information. She verbalized understanding with no further questions at this time.

## 2016-05-29 NOTE — Telephone Encounter (Signed)
Patients daughter called and left voicemail message that patient had not received event monitor. Spoke with [employer] rep and they said it should arrive around Wednesday 05/31/16. She was appreciative for my call and had no further questions at this time.

## 2016-06-02 ENCOUNTER — Ambulatory Visit (INDEPENDENT_AMBULATORY_CARE_PROVIDER_SITE_OTHER): Payer: Medicare Other

## 2016-06-02 DIAGNOSIS — R55 Syncope and collapse: Secondary | ICD-10-CM | POA: Diagnosis not present

## 2016-06-12 ENCOUNTER — Inpatient Hospital Stay: Payer: Medicare Other | Attending: Hematology and Oncology

## 2016-06-12 VITALS — BP 157/68

## 2016-06-12 DIAGNOSIS — Z79899 Other long term (current) drug therapy: Secondary | ICD-10-CM | POA: Diagnosis not present

## 2016-06-12 DIAGNOSIS — Z7982 Long term (current) use of aspirin: Secondary | ICD-10-CM | POA: Insufficient documentation

## 2016-06-12 DIAGNOSIS — E538 Deficiency of other specified B group vitamins: Secondary | ICD-10-CM | POA: Insufficient documentation

## 2016-06-12 DIAGNOSIS — Z90710 Acquired absence of both cervix and uterus: Secondary | ICD-10-CM | POA: Insufficient documentation

## 2016-06-12 DIAGNOSIS — E785 Hyperlipidemia, unspecified: Secondary | ICD-10-CM | POA: Diagnosis not present

## 2016-06-12 DIAGNOSIS — D649 Anemia, unspecified: Secondary | ICD-10-CM | POA: Diagnosis not present

## 2016-06-12 DIAGNOSIS — C9110 Chronic lymphocytic leukemia of B-cell type not having achieved remission: Secondary | ICD-10-CM | POA: Insufficient documentation

## 2016-06-12 DIAGNOSIS — I10 Essential (primary) hypertension: Secondary | ICD-10-CM | POA: Diagnosis not present

## 2016-06-12 DIAGNOSIS — E039 Hypothyroidism, unspecified: Secondary | ICD-10-CM | POA: Insufficient documentation

## 2016-06-12 DIAGNOSIS — H409 Unspecified glaucoma: Secondary | ICD-10-CM | POA: Diagnosis not present

## 2016-06-12 MED ORDER — CYANOCOBALAMIN 1000 MCG/ML IJ SOLN
1000.0000 ug | Freq: Once | INTRAMUSCULAR | Status: AC
  Administered 2016-06-12: 1000 ug via INTRAMUSCULAR
  Filled 2016-06-12: qty 1

## 2016-06-12 NOTE — Telephone Encounter (Signed)
Patients daughter calling in stating that the event monitor has not been working. The company contacted her stating that it was not transmitting to them and that they would send her a different monitor. She states that she has not yet received this new monitor. She states that her mother has an appointment tomorrow here in our office with Ignacia Bayley NP. Let her know that she should share her information at that time and they could determine if we need to try a different monitor. She verbalized understanding and had no further questions at this time.

## 2016-06-13 ENCOUNTER — Encounter: Payer: Self-pay | Admitting: Nurse Practitioner

## 2016-06-13 ENCOUNTER — Ambulatory Visit
Admission: RE | Admit: 2016-06-13 | Discharge: 2016-06-13 | Disposition: A | Discharge status: Home / Self Care | Payer: Medicare Other | Source: Ambulatory Visit | Attending: Nurse Practitioner | Admitting: Nurse Practitioner

## 2016-06-13 ENCOUNTER — Telehealth: Payer: Self-pay | Admitting: *Deleted

## 2016-06-13 ENCOUNTER — Ambulatory Visit (INDEPENDENT_AMBULATORY_CARE_PROVIDER_SITE_OTHER): Payer: Medicare Other | Admitting: Nurse Practitioner

## 2016-06-13 VITALS — BP 183/70 | HR 82 | Ht 62.5 in | Wt 140.2 lb

## 2016-06-13 DIAGNOSIS — J90 Pleural effusion, not elsewhere classified: Secondary | ICD-10-CM | POA: Insufficient documentation

## 2016-06-13 DIAGNOSIS — R0602 Shortness of breath: Secondary | ICD-10-CM | POA: Diagnosis not present

## 2016-06-13 DIAGNOSIS — I6523 Occlusion and stenosis of bilateral carotid arteries: Secondary | ICD-10-CM

## 2016-06-13 DIAGNOSIS — R05 Cough: Secondary | ICD-10-CM | POA: Diagnosis not present

## 2016-06-13 DIAGNOSIS — I10 Essential (primary) hypertension: Secondary | ICD-10-CM

## 2016-06-13 DIAGNOSIS — R55 Syncope and collapse: Secondary | ICD-10-CM

## 2016-06-13 DIAGNOSIS — I700 Atherosclerosis of aorta: Secondary | ICD-10-CM | POA: Diagnosis not present

## 2016-06-13 MED ORDER — FUROSEMIDE 20 MG PO TABS: 20.0000 mg | ORAL_TABLET | Freq: Every day | ORAL | 0 refills | Status: DC

## 2016-06-13 MED ORDER — LEVOFLOXACIN 750 MG PO TABS: 750.0000 mg | ORAL_TABLET | Freq: Every day | ORAL | 0 refills | Status: DC

## 2016-06-13 MED ORDER — AMLODIPINE BESYLATE 5 MG PO TABS: 5.0000 mg | ORAL_TABLET | Freq: Every day | ORAL | 6 refills | Status: DC

## 2016-06-13 NOTE — Telephone Encounter (Signed)
Spoke with patients daughter per release form. Reviewed results and recommendations with her and sent in prescriptions as ordered. Let her know that I would have someone call her tomorrow to schedule follow up for next week and she states that it would be helpful to follow up on Thursday in the morning if at all possible. She verbalized understanding of our conversation, agreement with plan, and had no further questions at this time.

## 2016-06-13 NOTE — Patient Instructions (Addendum)
Medication Instructions:  Your physician has recommended you make the following change in your medication:  1. START Amlodipine 5 mg once daily   Testing/Procedures: Chest x-ray  Follow-Up: Your physician recommends that you schedule a follow-up appointment after monitor has completed.   It was a pleasure seeing you today here in the office. Please do not hesitate to give [place] a call back if you have any further questions. [place], BSN

## 2016-06-13 NOTE — Progress Notes (Signed)
Office Visit    Patient Name: Sydney Mcdaniel Date of Encounter: 06/13/2016  Primary Care Provider:  Enid Derry, MD Primary Cardiologist: A. Yvone Neu, MD  Chief Complaint    82 year old female with prior history of syncope and hypertension who presents for follow-up.  Past Medical History    Past Medical History:  Diagnosis Date  . Allergy   . Anemia   . Cataract   . Glaucoma   . H/O: hysterectomy    Total  . Hyperlipidemia   . Hypertension   . Hypothyroidism   . Impaired fasting glucose   . Lichen sclerosus   . Osteoporosis    Hips  . Syncope    a. 03/2016 Echo: EF55-60%, no rwma, mild AI/MR, nl PASP; b. 03/2016 48h Holter: no significant arrhythmias/pauses; c. 03/2016 MV: mild apical defect, likely breast attenuation, nl EF, low risk.   Past Surgical History:  Procedure Laterality Date  . APPENDECTOMY    . EYE SURGERY     Glaucoma  . TOTAL ABDOMINAL HYSTERECTOMY      Allergies  Allergies  Allergen Reactions  . Alphagan [Brimonidine]   . Pravachol [Pravastatin Sodium]     History of Present Illness    82 year old female with the above complex past medical history. She was previously evaluated by Dr. Yvone Neu for Syncope in October. Stress testing was nonischemic. Echo showed normal LV function. 48 hour Holter did not reveal any significant arrhythmias or pauses. There was some concern that maybe low blood pressure was playing a role in her medications were adjusted by her primary care provider. In late November, she had recurrent near syncope that occurred after awaking and feeling poorly with chest discomfort. EMS came and felt she was stable however one she stood up, she became diaphoretic and lightheaded. She was taken to [facility] and subsequently admitted. Cardiac markers were negative. She was seen by our team and noted to have a 4.2 second pause during sleep and was evaluated by electrophysiology. 30 day event monitor was recommended. Blood  pressures fluctuated quite a bit and she was discharged off of all antihypertensives. She had previously been taking lisinopril 10 mg and amlodipine 5 mg daily.  Since discharge, she has had no recurrence of presyncope or syncope. She has felt somewhat fatigued and tracks her blood pressure at home. Off of antihypertensives, it has been rising and is in the 180s today. She has not had any chest pain but has noted some dyspnea and cough recently. She denies fevers, chills, PND, orthopnea, dizziness, syncope, edema, or early satiety.  Following discharge, she was placed on an event monitor however it was not able to transmit and so she is now waiting on a replacement.  Home Medications   Prior to Admission medications   Medication Sig Start Date End Date Taking? Authorizing Provider  ACCU-CHEK SOFTCLIX LANCETS lancets Use as instructed; check once a day; H40.1131 05/08/16  Yes Arnetha Courser, MD  aspirin 81 MG tablet Take 81 mg by mouth daily.   Yes Historical Provider, MD  atorvastatin (LIPITOR) 20 MG tablet Take 1 tablet (20 mg total) by mouth at bedtime. 04/12/16  Yes Arnetha Courser, MD  Calcium Carb-Cholecalciferol (CALCIUM 600 + D PO) Take by mouth daily.   Yes Historical Provider, MD  glucose blood (ACCU-CHEK AVIVA PLUS) test strip Use as instructed, check once a day; H40.1131 05/08/16  Yes Arnetha Courser, MD  ipratropium (ATROVENT) 0.03 % nasal spray Place 2 sprays into both  nostrils 3 (three) times daily. Patient taking differently: Place 2 sprays into both nostrils daily as needed.  12/21/15  Yes Arnetha Courser, MD  latanoprost (XALATAN) 0.005 % ophthalmic solution Place 1 drop into the right eye at bedtime.  04/24/15  Yes Historical Provider, MD  levothyroxine (SYNTHROID, LEVOTHROID) 75 MCG tablet One by mouth on Mondays, Wednesdays, and Fridays 03/23/16  Yes Arnetha Courser, MD  levothyroxine (SYNTHROID, LEVOTHROID) 88 MCG tablet One by mouth on Tuesdays, Thursdays, Saturdays, and Sundays  03/23/16  Yes Arnetha Courser, MD  Multiple Vitamins-Minerals (MULTIVITAMIN ADULT PO) Take by mouth.   Yes Historical Provider, MD  Omega-3 Fatty Acids (FISH OIL) 1200 MG CAPS Take by mouth daily.   Yes Historical Provider, MD  timolol (TIMOPTIC) 0.5 % ophthalmic solution Place 1 drop into the right eye 2 (two) times daily.  04/24/15  Yes Historical Provider, MD    Review of Systems    Fatigue, cough (nonproductive) and DOE as outlined above.  No recurrent syncope/presyncope.  All other systems reviewed and are otherwise negative except as noted above.  Physical Exam    VS:  BP (!) 183/70 (BP Location: Left Arm, Patient Position: Sitting, Cuff Size: Normal)   Pulse 82   Ht 5' 2.5" (1.588 m)   Wt 140 lb 4 oz (63.6 kg)   LMP  (LMP Unknown)   BMI 25.24 kg/m  , BMI Body mass index is 25.24 kg/m. GEN: Well nourished, well developed, in no acute distress.  HEENT: normal.  Neck: Supple, no JVD, carotid bruits, or masses. Cardiac: RRR, no murmurs, rubs, or gallops. No clubbing, cyanosis.  Trace bilat ankle edema.  Radials/DP/PT 2+ and equal bilaterally.  Respiratory:  Respirations regular and unlabored, very diminished breath sounds in the left base, otherwise clear to auscultation bilaterally. GI: Soft, nontender, nondistended, BS + x 4. MS: no deformity or atrophy. Skin: warm and dry, no rash. Neuro:  Strength and sensation are intact. Psych: Normal affect.  Accessory Clinical Findings    ECG - RSR, 82, first-degree AV block, left axis deviation, left bundle branch block, no acute ST or T changes.   Assessment & Plan    1.   syncope/presyncope: Patient was previous evaluated in October following a syncopal episode. Evaluation of that time was unrevealing, with normal LV function on echo, no ischemia on Myoview, and no evidence of significant pauses or arrhythmia on monitoring. She was recently admitted to [facility] regional with presyncope that occurred after standing at home. This was  felt to be most likely secondary to orthostasis. During admission, she was noted to have a 4.1 second pause during sleep. She was seen by Dr. Caryl Comes from our team with recommendation for 30 day event monitor. She had worn this however she was contacted by the company and notified that it was not transmitting as it was on an [employer] signal and apparently they don't get good coverage with [employer] at their house. A monitor with a [employer] signal has apparently been mailed to them and we are checking on this today. Regardless, she has had no recurrence of presyncope or syncope. Await future 30 day monitoring.   2. Hypertension: Patient was previously on lisinopril 10 mg daily and amlodipine 5 mg daily. During her most recent admission in the setting of presyncope, all antihypertensives were discontinued. She has been following her blood pressure at home and over the past 2 weeks, it has steadily been climbing. She is at 183/70 today. She has felt fatigued  and has also noted some dyspnea on exertion. I'm going to resume amlodipine 5 mg daily with a goal systolic of approximate [id number] mmHg as she appears to be prone to orthostasis. She will continue to follow her blood pressure at home.  3. Dyspnea on exertion and cough: Patient has been having both dyspnea on exertion and cough which has been nonproductive, since her admission. She has not had any significant swelling that she is aware of but does have trace edema on exam. Neck veins are not particularly elevated today. She has diminished breath sounds at the left base. She has not had fevers or chills. I will obtain a PA and lateral chest x-ray -question pleural effusion versus pneumonia versus mild edema. Further recommendations pending chest x-ray.   4. Disposition: Follow-up chest x-ray today. She will continue to follow her blood pressure at home. Follow-up with Dr. Yvone Neu in approximately 4-6 weeks after monitoring period complete.   [name], NP 06/13/2016,  3:54 PM

## 2016-06-13 NOTE — Telephone Encounter (Signed)
-----   Message from Rogelia Mire, NP sent at 06/13/2016  5:08 PM EST ----- cxr w/ L>R pleural effusions, which explains reduced breath sounds on L on exam.  Please send in a Rx for lasix 20 mg daily and lets cover for possible pna w/ levaquin 750 mg daily x 5 days.  She will need to f/u next week in clinic.

## 2016-06-14 NOTE — Telephone Encounter (Signed)
Left voicemail message that Ignacia Bayley NP would like to see patient back this Thursday or Friday and to please call back to discuss follow up.

## 2016-06-14 NOTE — Telephone Encounter (Signed)
Spoke with patients daughter and let her know that Ignacia Bayley NP would like to see her back on Friday at 11:30AM. She verbalized understanding and had no further questions at this time.

## 2016-06-14 NOTE — Telephone Encounter (Signed)
Spoke to daughter they are able to come 06/28/16 at 23 with Dr Yvone Neu She states by then they will be out of medication  Please advise

## 2016-06-15 ENCOUNTER — Ambulatory Visit (INDEPENDENT_AMBULATORY_CARE_PROVIDER_SITE_OTHER): Payer: Medicare Other

## 2016-06-15 DIAGNOSIS — R55 Syncope and collapse: Secondary | ICD-10-CM | POA: Diagnosis not present

## 2016-06-16 ENCOUNTER — Other Ambulatory Visit
Admission: RE | Admit: 2016-06-16 | Discharge: 2016-06-16 | Disposition: A | Discharge status: Home / Self Care | Payer: Medicare Other | Source: Ambulatory Visit | Attending: Nurse Practitioner | Admitting: Nurse Practitioner

## 2016-06-16 ENCOUNTER — Ambulatory Visit (INDEPENDENT_AMBULATORY_CARE_PROVIDER_SITE_OTHER): Payer: Medicare Other | Admitting: Nurse Practitioner

## 2016-06-16 ENCOUNTER — Encounter: Payer: Self-pay | Admitting: Nurse Practitioner

## 2016-06-16 VITALS — BP 132/60 | HR 65 | Ht 62.0 in | Wt 135.8 lb

## 2016-06-16 DIAGNOSIS — I5031 Acute diastolic (congestive) heart failure: Secondary | ICD-10-CM | POA: Diagnosis not present

## 2016-06-16 DIAGNOSIS — I10 Essential (primary) hypertension: Secondary | ICD-10-CM | POA: Diagnosis not present

## 2016-06-16 DIAGNOSIS — I110 Hypertensive heart disease with heart failure: Secondary | ICD-10-CM | POA: Diagnosis not present

## 2016-06-16 DIAGNOSIS — J90 Pleural effusion, not elsewhere classified: Secondary | ICD-10-CM

## 2016-06-16 DIAGNOSIS — I6523 Occlusion and stenosis of bilateral carotid arteries: Secondary | ICD-10-CM

## 2016-06-16 DIAGNOSIS — R55 Syncope and collapse: Secondary | ICD-10-CM | POA: Diagnosis not present

## 2016-06-16 LAB — BASIC METABOLIC PANEL
Anion gap: 7 (ref 5–15)
BUN: 11 mg/dL (ref 6–20)
CALCIUM: 8.7 mg/dL — AB (ref 8.9–10.3)
CO2: 25 mmol/L (ref 22–32)
CREATININE: 0.72 mg/dL (ref 0.44–1.00)
Chloride: 96 mmol/L — ABNORMAL LOW (ref 101–111)
GFR calc Af Amer: >60 mL/min (ref >60)
Glucose, Bld: 101 mg/dL — ABNORMAL HIGH (ref 65–99)
POTASSIUM: 4.3 mmol/L (ref 3.5–5.1)
SODIUM: 128 mmol/L — AB (ref 135–145)

## 2016-06-16 NOTE — Progress Notes (Addendum)
Office Visit    Patient Name: Sydney Mcdaniel Date of Encounter: 06/16/2016  Primary Care Provider:  Enid Derry, MD Primary Cardiologist:  A. Yvone Neu, MD   Chief Complaint    82 year old female with prior history of syncope and hypertension who recently presented with dyspnea and CXR showed L>R pleural effusions - now presenting for f/u.  Past Medical History    Past Medical History:  Diagnosis Date  . Allergy   . Anemia   . Cataract   . Glaucoma   . H/O: hysterectomy    Total  . Hyperlipidemia   . Hypertension   . Hypothyroidism   . Impaired fasting glucose   . Lichen sclerosus   . Osteoporosis    Hips  . Syncope    a. 03/2016 Echo: EF55-60%, no rwma, mild AI/MR, nl PASP; b. 03/2016 48h Holter: no significant arrhythmias/pauses; c. 03/2016 MV: mild apical defect, likely breast attenuation, nl EF, low risk.   Past Surgical History:  Procedure Laterality Date  . APPENDECTOMY    . EYE SURGERY     Glaucoma  . TOTAL ABDOMINAL HYSTERECTOMY      Allergies  Allergies  Allergen Reactions  . Alphagan [Brimonidine]   . Pravachol [Pravastatin Sodium]     History of Present Illness    82 year old female with the above complex past medical history. She was previously evaluated by Dr. Yvone Neu for Syncope in October. Stress testing was nonischemic. Echo showed normal LV function. 48 hour Holter did not reveal any significant arrhythmias or pauses. There was some concern that maybe low blood pressure was playing a role in her medications were adjusted by her primary care provider. In late November 2017, she had recurrent near syncope that occurred after awaking and feeling poorly with chest discomfort. EMS came and felt she was stable however one she stood up, she became diaphoretic and lightheaded. She was taken to [facility] and subsequently admitted. Cardiac markers were negative. She was seen by our team and noted to have a 4.2 second pause during sleep and was  evaluated by electrophysiology. 30 day event monitor was recommended. Blood pressures fluctuated quite a bit and she was discharged off of all antihypertensives. She had previously been taking lisinopril 10 mg and amlodipine 5 mg daily.  I saw her in clinic on 12/19 @ which point she was not having any recurrent presyncope or syncope but c/o fatigue, rising BPs @ home, DOE, and cough.  Wt was up 8 lbs above baseline.  She had very diminished breath sounds in the left base.  CXR was performed and showed L>R pleural effusions.  PNA could not be ruled out and radiology rec f/u CXR after abx course.  I sent her in a Rx for levaquin and low dose lasix as I had suspicion that diast failure ni the setting of elevated BPs while off of previous antihypertensive Rx may be playing a role.  Since her last visit she feels significantly better.  Her wt is down 5 lbs and cough has resolved.  She still has some DOE but this is much better.  She is still 2-3 lbs above her baseline wt of 132-133 bls.  She denies chest pain, palpitations, pnd, orthopnea, n, v, dizziness, syncope, edema, weight gain, fever, chills, or early satiety.   Home Medications    Prior to Admission medications   Medication Sig Start Date End Date Taking? Authorizing Provider  ACCU-CHEK SOFTCLIX LANCETS lancets Use as instructed; check once a day;  [id number] 05/08/16   Arnetha Courser, MD  amLODipine (NORVASC) 5 MG tablet Take 1 tablet (5 mg total) by mouth daily. 06/13/16 09/11/16  Rogelia Mire, NP  aspirin 81 MG tablet Take 81 mg by mouth daily.    Historical Provider, MD  atorvastatin (LIPITOR) 20 MG tablet Take 1 tablet (20 mg total) by mouth at bedtime. 04/12/16   Arnetha Courser, MD  Calcium Carb-Cholecalciferol (CALCIUM 600 + D PO) Take by mouth daily.    Historical Provider, MD  furosemide (LASIX) 20 MG tablet Take 1 tablet (20 mg total) by mouth daily. 06/13/16 09/11/16  Rogelia Mire, NP  glucose blood (ACCU-CHEK AVIVA PLUS) test  strip Use as instructed, check once a day; H40.1131 05/08/16   Arnetha Courser, MD  ipratropium (ATROVENT) 0.03 % nasal spray Place 2 sprays into both nostrils 3 (three) times daily. Patient taking differently: Place 2 sprays into both nostrils daily as needed.  12/21/15   Arnetha Courser, MD  latanoprost (XALATAN) 0.005 % ophthalmic solution Place 1 drop into the right eye at bedtime.  04/24/15   Historical Provider, MD  levofloxacin (LEVAQUIN) 750 MG tablet Take 1 tablet (750 mg total) by mouth daily. 06/13/16   Rogelia Mire, NP  levothyroxine (SYNTHROID, LEVOTHROID) 75 MCG tablet One by mouth on Mondays, Wednesdays, and Fridays 03/23/16   Arnetha Courser, MD  levothyroxine Wil[place] Floor, LEVOTHROID) 88 MCG tablet One by mouth on Tuesdays, Thursdays, Saturdays, and Sundays 03/23/16   Arnetha Courser, MD  Multiple Vitamins-Minerals (MULTIVITAMIN ADULT PO) Take by mouth.    Historical Provider, MD  Omega-3 Fatty Acids (FISH OIL) 1200 MG CAPS Take by mouth daily.    Historical Provider, MD  timolol (TIMOPTIC) 0.5 % ophthalmic solution Place 1 drop into the right eye 2 (two) times daily.  04/24/15   Historical Provider, MD    Review of Systems    Still some DOE but overall feeling better.  She denies fever, chills, cough, chest pain, palpitations, pnd, orthopnea, n, v, dizziness, syncope, edema, weight gain, or early satiety.  All other systems reviewed and are otherwise negative except as noted above.  Physical Exam    VS:  BP 132/60 (BP Location: Left Leg, Patient Position: Sitting)   Pulse 65   Ht 5\' 2"  (1.575 m)   Wt 135 lb 12 oz (61.6 kg)   LMP  (LMP Unknown)   BMI 24.83 kg/m  , BMI Body mass index is 24.83 kg/m. GEN: Well nourished, well developed, in no acute distress.  HEENT: normal.  Neck: Supple, no JVD, carotid bruits, or masses. Cardiac: RRR, no murmurs, rubs, or gallops. No clubbing, cyanosis, edema.  Radials/DP/PT 2+ and equal bilaterally.  Respiratory:  Respirations regular  and unlabored, diminished in L base but moving more air then 3 days ago. GI: Soft, nontender, nondistended, BS + x 4. MS: no deformity or atrophy. Skin: warm and dry, no rash. Neuro:  Strength and sensation are intact. Psych: Normal affect.  Accessory Clinical Findings    CXR 12.19.2017  IMPRESSION: New bilateral pleural effusions greatest on the left. Atelectasis or pneumonia could be present an obscured at the lung bases. No definite pulmonary edema. Followup PA and lateral chest X-ray is recommended in 3-4 weeks following trial of antibiotic therapy to ensure resolution and exclude underlying malignancy.  Thoracic aortic atherosclerosis.  ECG:  RSR, 65, 1st deg AVB, poor R progression.  Assessment & Plan    1.   Bilateral, L>R Pleural  Effusions/ ? PNA:  I saw Ms. Loughmiller on 12/19 and she c/o dyspnea and cough. She had diminished breath sounds in the left base on exam.  CXR confirmed L>R bilateral pleural effusions.  PNA could not be ruled out.  In that setting I sent in Rx for a five day course of levaquin and low dose lasix.  She has been feeling much better.  Still w/ some DOE but improved.  Cough has resolved.  No fever/chills.  Wt is down 5 lbs but still 2-3 lbs above prior baseline.  It is difficult to know what role pna might have played (she will complete 5 day course of levaquin on Sunday) but I suspect primary issue was diastolic failure in the setting of being off of antihypertensives following hospitalization for orthostasis with subsequent elevation of BP and diastolic failure.  BP is much improved today now that she is back on amlodipine 5 mg and lasix 20 mg daily.  I hope that with adequate BP control, we can avoid volume overload and can transition her off of lasix or at least to prn lasix for wt gain only.  I will f/u a bmet today and have asked her to continue with lasix 20 mg daily for now.  Provided that renal fxn stable, I will have her f/u on 1/2 with repeat bmet and  also PA and Lat CXR and then she'll see A. Yvone Neu, MD on 1/3 as planned.  If persistent symptomatic effusion despite abx and diuretic treatment, will have to consider pulm referral for therapeutic and diagnostic thoracentesis.  I have given her and her dtr instructions to call us if her wt starts dropping below her previous baseline of 132 - 133 lbs, at which point we would d/c lasix, or if wt increases by 2 lbs overnight or 5 lbs over the course of the week.   2.  Syncope/presyncope: Patient was previously evaluated in October following a syncopal episode. Evaluation of that time was unrevealing, with normal LV function on echo, no ischemia on Myoview, and no evidence of significant pauses or arrhythmia on monitoring. She was recently admitted to [facility] with presyncope that occurred after standing at home. This was felt to be most likely secondary to orthostasis. During admission, she was noted to have a 4.1 second pause during sleep. She was seen by Dr. Caryl Comes from our team with recommendation for 30 day event monitor. No recurrent Ss.  She now has a 30 day event monitor on (initially issues w/ transmission on first monitor she was sent).   3. Hypertensive heart disease with acute diastolic chf: See #1.  Developed volume overload in the setting of poorly controlled HTN after being taken off of amlodipine and lisionpril during hospitalization for orthostasis.  I resumed amlodipine 5 mg daily and placed her on lasix 20 mg daily 3 days ago and BP is much better.  No orthostatic Ss.  As above, hopefully can wean off of lasix.  May need titration of amlodpine @ that point pendign BP.  She will continue to follow BP @ home.    4. Disposition: Follow-up bmet today.  Plan for f/u cxr on 1/2 to reassess L pleural effusion.  She will see Dr. Yvone Neu on 1/2.  [name], NP 06/16/2016, 12:11 PM

## 2016-06-16 NOTE — Patient Instructions (Signed)
Medication Instructions:  Your physician recommends that you continue on your current medications as directed. Please refer to the Current Medication list given to you today.   Labwork: Your physician recommends that you return for lab work in: [place] at [employer].  Your physician recommends that you return for lab work on 06/27/16 at the [facility].   Testing/Procedures: A chest x-ray takes a picture of the organs and structures inside the chest, including the heart, lungs, and blood vessels. This test can show several things, including, whether the heart is enlarges; whether fluid is building up in the lungs; and whether pacemaker / defibrillator leads are still in place.  Go to [facility] on 06/27/16 for labs and chest xray.   Follow-Up: Your physician recommends that you schedule a follow-up appointment AS SCHEDULED WITH DR Yvone Neu.   If you need a refill on your cardiac medications before your next appointment, please call your pharmacy.

## 2016-06-17 ENCOUNTER — Telehealth: Payer: Self-pay | Admitting: Cardiology

## 2016-06-17 NOTE — Telephone Encounter (Signed)
Pt's daughter called, the pt had syncope this AM her BP was 109/50 when first checked, then 125/76.  She had taken her lasix and her wt is 131.  I asked her to hold AM BP med.  She will now begin lasix every other day and hold BP med if systolic BP< [id number].  She has had syncope before.  I do worry about her pl effusion, she will call if SOB.

## 2016-06-22 ENCOUNTER — Encounter: Payer: Self-pay | Admitting: Family Medicine

## 2016-06-22 ENCOUNTER — Ambulatory Visit (INDEPENDENT_AMBULATORY_CARE_PROVIDER_SITE_OTHER): Payer: Medicare Other | Admitting: Family Medicine

## 2016-06-22 VITALS — BP 142/74 | HR 73 | Temp 98.1°F | Resp 14 | Ht 62.0 in | Wt 135.5 lb

## 2016-06-22 DIAGNOSIS — Z13820 Encounter for screening for osteoporosis: Secondary | ICD-10-CM

## 2016-06-22 DIAGNOSIS — Z0000 Encounter for general adult medical examination without abnormal findings: Secondary | ICD-10-CM

## 2016-06-22 DIAGNOSIS — Z1231 Encounter for screening mammogram for malignant neoplasm of breast: Secondary | ICD-10-CM | POA: Diagnosis not present

## 2016-06-22 DIAGNOSIS — Z1239 Encounter for other screening for malignant neoplasm of breast: Secondary | ICD-10-CM | POA: Insufficient documentation

## 2016-06-22 NOTE — Assessment & Plan Note (Signed)
Screen for osteoporosis, fall precautions; take 1000 iu vitamin D3

## 2016-06-22 NOTE — Progress Notes (Signed)
BP (!) 142/74 (BP Location: Left Arm, Patient Position: Sitting, Cuff Size: Normal)   Pulse 73   Temp 98.1 F (36.7 C) (Oral)   Resp 14   Ht 5\' 2"  (1.575 m)   Wt 135 lb 8 oz (61.5 kg)   LMP  (LMP Unknown)   SpO2 98%   BMI 24.78 kg/m    Subjective:    Patient ID: Sydney Mcdaniel, female    DOB: 1925-02-13, 82 y.o.   MRN: [id number]  HPI: Sydney Mcdaniel is a 82 y.o. female  Chief Complaint  Patient presents with  . Annual Exam     Depression screen [facility] 2/9 06/22/2016 03/22/2016 02/25/2016 12/21/2015 06/22/2015  Decreased Interest 0 0 0 0 0  Down, Depressed, Hopeless 0 0 0 0 1  PHQ - 2 Score 0 0 0 0 1  Altered sleeping - - - - -  Tired, decreased energy - - - - -  Change in appetite - - - - -  Feeling bad or failure about yourself  - - - - -  Trouble concentrating - - - - -  Moving slowly or fidgety/restless - - - - -  Suicidal thoughts - - - - -  PHQ-9 Score - - - - -    No flowsheet data found.  Relevant past medical, surgical, family and social history reviewed Past Medical History:  Diagnosis Date  . Allergy   . Anemia   . Cataract   . Glaucoma   . H/O: hysterectomy    Total  . Hyperlipidemia   . Hypertension   . Hypothyroidism   . Impaired fasting glucose   . Lichen sclerosus   . Osteoporosis    Hips  . Syncope    a. 03/2016 Echo: EF55-60%, no rwma, mild AI/MR, nl PASP; b. 03/2016 48h Holter: no significant arrhythmias/pauses; c. 03/2016 MV: mild apical defect, likely breast attenuation, nl EF, low risk.   Past Surgical History:  Procedure Laterality Date  . APPENDECTOMY    . EYE SURGERY     Glaucoma  . TOTAL ABDOMINAL HYSTERECTOMY     Family History  Problem Relation Age of Onset  . Heart attack Mother   . Glaucoma Mother   . Heart attack Father   . Parkinson's disease Brother   . Heart attack Brother    Social History  Substance Use Topics  . Smoking status: Never Smoker  . Smokeless tobacco: Never Used  . Alcohol use No    Interim  medical history since last visit reviewed. Allergies and medications reviewed  Review of Systems Per HPI unless specifically indicated above     Objective:    BP (!) 142/74 (BP Location: Left Arm, Patient Position: Sitting, Cuff Size: Normal)   Pulse 73   Temp 98.1 F (36.7 C) (Oral)   Resp 14   Ht 5\' 2"  (1.575 m)   Wt 135 lb 8 oz (61.5 kg)   LMP  (LMP Unknown)   SpO2 98%   BMI 24.78 kg/m   Wt Readings from Last 3 Encounters:  06/22/16 135 lb 8 oz (61.5 kg)  06/16/16 135 lb 12 oz (61.6 kg)  06/13/16 140 lb 4 oz (63.6 kg)    Physical Exam  Results for orders placed or performed during the hospital encounter of [id number]  Basic metabolic panel  Result Value Ref Range   Sodium 128 (L) 135 - 145 mmol/L   Potassium 4.3 3.5 - 5.1 mmol/L   Chloride 96 (L)  101 - 111 mmol/L   CO2 25 22 - 32 mmol/L   Glucose, Bld 101 (H) 65 - 99 mg/dL   BUN 11 6 - 20 mg/dL   Creatinine, Ser 0.72 0.44 - 1.00 mg/dL   Calcium 8.7 (L) 8.9 - 10.3 mg/dL   GFR calc non Af Amer >60 >60 mL/min   GFR calc Af Amer >60 >60 mL/min   Anion gap 7 5 - 15      Assessment & Plan:   Problem List Items Addressed This Visit    None       Follow up plan: No Follow-up on file.  An after-visit summary was printed and given to the patient at [place].  Please see the patient instructions which may contain other information and recommendations beyond what is mentioned above in the assessment and plan.  No orders of the defined types were placed in this encounter.   No orders of the defined types were placed in this encounter.

## 2016-06-22 NOTE — Patient Instructions (Addendum)
Start taking vitamin D3, one thousand international units (1,000 iu) daily Practice good fall precautions Please do call to schedule your bone density study; the number to schedule one at either [facility] or [facility] is [phone] Please do call to schedule your mammogram; the number to schedule one at either [facility] or [facility] is [phone]  Health Maintenance  Topic Date Due  . TETANUS/TDAP  01/21/2014  . INFLUENZA VACCINE  Completed  . DEXA SCAN  Addressed  . ZOSTAVAX  Completed  . PNA vac Low Risk Adult  Completed    Fall Prevention in the Home Introduction Falls can cause injuries. They can happen to people of all ages. There are many things you can do to make your home safe and to help prevent falls. What can I do on the outside of my home?  Regularly fix the edges of walkways and driveways and fix any cracks.  Remove anything that might make you trip as you walk through a door, such as a raised step or threshold.  Trim any bushes or trees on the path to your home.  Use bright outdoor lighting.  Clear any walking paths of anything that might make someone trip, such as rocks or tools.  Regularly check to see if handrails are loose or broken. Make sure that both sides of any steps have handrails.  Any raised decks and porches should have guardrails on the edges.  Have any leaves, snow, or ice cleared regularly.  Use sand or salt on walking paths during winter.  Clean up any spills in your garage right away. This includes oil or grease spills. What can I do in the bathroom?  Use night lights.  Install grab bars by the toilet and in the tub and shower. Do not use towel bars as grab bars.  Use non-skid mats or decals in the tub or shower.  If you need to sit down in the shower, use a plastic, non-slip stool.  Keep the floor dry. Clean up any water that spills on the floor as soon as it  happens.  Remove soap buildup in the tub or shower regularly.  Attach bath mats securely with double-sided non-slip rug tape.  Do not have throw rugs and other things on the floor that can make you trip. What can I do in the bedroom?  Use night lights.  Make sure that you have a light by your bed that is easy to reach.  Do not use any sheets or blankets that are too big for your bed. They should not hang down onto the floor.  Have a firm chair that has side arms. You can use this for support while you get dressed.  Do not have throw rugs and other things on the floor that can make you trip. What can I do in the kitchen?  Clean up any spills right away.  Avoid walking on wet floors.  Keep items that you use a lot in easy-to-reach places.  If you need to reach something above you, use a strong step stool that has a grab bar.  Keep electrical cords out of the way.  Do not use floor polish or wax that makes floors slippery. If you must use wax, use non-skid floor wax.  Do not have throw rugs and other things on the floor that can make you trip. What can I do with my stairs?  Do not leave any items on the stairs.  Make sure that there are handrails on both sides of the stairs and use them. Fix handrails that are broken or loose. Make sure that handrails are as long as the stairways.  Check any carpeting to make sure that it is firmly attached to the stairs. Fix any carpet that is loose or worn.  Avoid having throw rugs at the top or bottom of the stairs. If you do have throw rugs, attach them to the floor with carpet tape.  Make sure that you have a light switch at the top of the stairs and the bottom of the stairs. If you do not have them, ask someone to add them for you. What else can I do to help prevent falls?  Wear shoes that:  Do not have high heels.  Have rubber bottoms.  Are comfortable and fit you well.  Are closed at the toe. Do not wear sandals.  If you  use a stepladder:  Make sure that it is fully opened. Do not climb a closed stepladder.  Make sure that both sides of the stepladder are locked into place.  Ask someone to hold it for you, if possible.  Clearly mark and make sure that you can see:  Any grab bars or handrails.  First and last steps.  Where the edge of each step is.  Use tools that help you move around (mobility aids) if they are needed. These include:  Canes.  Walkers.  Scooters.  Crutches.  Turn on the lights when you go into a dark area. Replace any light bulbs as soon as they burn out.  Set up your furniture so you have a clear path. Avoid moving your furniture around.  If any of your floors are uneven, fix them.  If there are any pets around you, be aware of where they are.  Review your medicines with your doctor. Some medicines can make you feel dizzy. This can increase your chance of falling. Ask your doctor what other things that you can do to help prevent falls. This information is not intended to replace advice given to you by your health care provider. Make sure you discuss any questions you have with your health care provider. Document Released: 04/08/2009 Document Revised: 11/18/2015 Document Reviewed: 07/17/2014  2017 [employer]  Health Maintenance for Postmenopausal Women Introduction Menopause is a normal process in which your reproductive ability comes to an end. This process happens gradually over a span of months to years, usually between the ages of 2 and 60. Menopause is complete when you have missed 12 consecutive menstrual periods. It is important to talk with your health care provider about some of the most common conditions that affect postmenopausal women, such as heart disease, cancer, and bone loss (osteoporosis). Adopting a healthy lifestyle and getting preventive care can help to promote your health and wellness. Those actions can also lower your chances of developing some of  these common conditions. What should I know about menopause? During menopause, you may experience a number of symptoms, such as:  Moderate-to-severe hot flashes.  Night sweats.  Decrease in sex drive.  Mood swings.  Headaches.  Tiredness.  Irritability.  Memory problems.  Insomnia. Choosing to treat or not to treat menopausal changes is an individual decision that you make with your health care provider. What should I know about hormone replacement therapy and supplements? Hormone therapy products are effective for treating symptoms that are associated with menopause, such as hot flashes and night sweats. Hormone replacement carries  certain risks, especially as you become older. If you are thinking about using estrogen or estrogen with progestin treatments, discuss the benefits and risks with your health care provider. What should I know about heart disease and stroke? Heart disease, heart attack, and stroke become more likely as you age. This may be due, in part, to the hormonal changes that your body experiences during menopause. These can affect how your body processes dietary fats, triglycerides, and cholesterol. Heart attack and stroke are both medical emergencies. There are many things that you can do to help prevent heart disease and stroke:  Have your blood pressure checked at least every 1-2 years. High blood pressure causes heart disease and increases the risk of stroke.  If you are 34-59 years old, ask your health care provider if you should take aspirin to prevent a heart attack or a stroke.  Do not use any tobacco products, including cigarettes, chewing tobacco, or electronic cigarettes. If you need help quitting, ask your health care provider.  It is important to eat a healthy diet and maintain a healthy weight.  Be sure to include plenty of vegetables, fruits, low-fat dairy products, and lean protein.  Avoid eating foods that are high in solid fats, added sugars,  or salt (sodium).  Get regular exercise. This is one of the most important things that you can do for your health.  Try to exercise for at least 150 minutes each week. The type of exercise that you do should increase your heart rate and make you sweat. This is known as moderate-intensity exercise.  Try to do strengthening exercises at least twice each week. Do these in addition to the moderate-intensity exercise.  Know your numbers.Ask your health care provider to check your cholesterol and your blood glucose. Continue to have your blood tested as directed by your health care provider. What should I know about cancer screening? There are several types of cancer. Take the following steps to reduce your risk and to catch any cancer development as early as possible. Breast Cancer  Practice breast self-awareness.  This means understanding how your breasts normally appear and feel.  It also means doing regular breast self-exams. Let your health care provider know about any changes, no matter how small.  If you are 63 or older, have a clinician do a breast exam (clinical breast exam or CBE) every year. Depending on your age, family history, and medical history, it may be recommended that you also have a yearly breast X-ray (mammogram).  If you have a family history of breast cancer, talk with your health care provider about genetic screening.  If you are at high risk for breast cancer, talk with your health care provider about having an MRI and a mammogram every year.  Breast cancer (BRCA) gene test is recommended for women who have family members with BRCA-related cancers. Results of the assessment will determine the need for genetic counseling and BRCA1 and for BRCA2 testing. BRCA-related cancers include these types:  Breast. This occurs in males or females.  Ovarian.  Tubal. This may also be called fallopian tube cancer.  Cancer of the abdominal or pelvic lining (peritoneal  cancer).  Prostate.  Pancreatic. Cervical, Uterine, and Ovarian Cancer  Your health care provider may recommend that you be screened regularly for cancer of the pelvic organs. These include your ovaries, uterus, and vagina. This screening involves a pelvic exam, which includes checking for microscopic changes to the surface of your cervix (Pap test).  For  women ages 21-65, health care providers may recommend a pelvic exam and a Pap test every three years. For women ages 25-65, they may recommend the Pap test and pelvic exam, combined with testing for human papilloma virus (HPV), every five years. Some types of HPV increase your risk of cervical cancer. Testing for HPV may also be done on women of any age who have unclear Pap test results.  Other health care providers may not recommend any screening for nonpregnant women who are considered low risk for pelvic cancer and have no symptoms. Ask your health care provider if a screening pelvic exam is right for you.  If you have had past treatment for cervical cancer or a condition that could lead to cancer, you need Pap tests and screening for cancer for at least 20 years after your treatment. If Pap tests have been discontinued for you, your risk factors (such as having a new sexual partner) need to be reassessed to determine if you should start having screenings again. Some women have medical problems that increase the chance of getting cervical cancer. In these cases, your health care provider may recommend that you have screening and Pap tests more often.  If you have a family history of uterine cancer or ovarian cancer, talk with your health care provider about genetic screening.  If you have vaginal bleeding after reaching menopause, tell your health care provider.  There are currently no reliable tests available to screen for ovarian cancer. Lung Cancer  Lung cancer screening is recommended for adults 50-35 years old who are at high risk for  lung cancer because of a history of smoking. A yearly low-dose CT scan of the lungs is recommended if you:  Currently smoke.  Have a history of at least 30 pack-years of smoking and you currently smoke or have quit within the past 15 years. A pack-year is smoking an average of one pack of cigarettes per day for one year. Yearly screening should:  Continue until it has been 15 years since you quit.  Stop if you develop a health problem that would prevent you from having lung cancer treatment. Colorectal Cancer  This type of cancer can be detected and can often be prevented.  Routine colorectal cancer screening usually begins at age 61 and continues through age 44.  If you have risk factors for colon cancer, your health care provider may recommend that you be screened at an earlier age.  If you have a family history of colorectal cancer, talk with your health care provider about genetic screening.  Your health care provider may also recommend using home test kits to check for hidden blood in your stool.  A small camera at the end of a tube can be used to examine your colon directly (sigmoidoscopy or colonoscopy). This is done to check for the earliest forms of colorectal cancer.  Direct examination of the colon should be repeated every 5-10 years until age 62. However, if early forms of precancerous polyps or small growths are found or if you have a family history or genetic risk for colorectal cancer, you may need to be screened more often. Skin Cancer  Check your skin from head to toe regularly.  Monitor any moles. Be sure to tell your health care provider:  About any new moles or changes in moles, especially if there is a change in a mole's shape or color.  If you have a mole that is larger than the size of a pencil eraser.  If any of your family members has a history of skin cancer, especially at a young age, talk with your health care provider about genetic screening.  Always  use sunscreen. Apply sunscreen liberally and repeatedly throughout the day.  Whenever you are outside, protect yourself by wearing long sleeves, pants, a wide-brimmed hat, and sunglasses. What should I know about osteoporosis? Osteoporosis is a condition in which bone destruction happens more quickly than new bone creation. After menopause, you may be at an increased risk for osteoporosis. To help prevent osteoporosis or the bone fractures that can happen because of osteoporosis, the following is recommended:  If you are 56-70 years old, get at least 1,000 mg of calcium and at least 600 mg of vitamin D per day.  If you are older than age 68 but younger than age 44, get at least 1,200 mg of calcium and at least 600 mg of vitamin D per day.  If you are older than age 35, get at least 1,200 mg of calcium and at least 800 mg of vitamin D per day. Smoking and excessive alcohol intake increase the risk of osteoporosis. Eat foods that are rich in calcium and vitamin D, and do weight-bearing exercises several times each week as directed by your health care provider. What should I know about how menopause affects my mental health? Depression may occur at any age, but it is more common as you become older. Common symptoms of depression include:  Low or sad mood.  Changes in sleep patterns.  Changes in appetite or eating patterns.  Feeling an overall lack of motivation or enjoyment of activities that you previously enjoyed.  Frequent crying spells. Talk with your health care provider if you think that you are experiencing depression. What should I know about immunizations? It is important that you get and maintain your immunizations. These include:  Tetanus, diphtheria, and pertussis (Tdap) booster vaccine.  Influenza every year before the flu season begins.  Pneumonia vaccine.  Shingles vaccine. Your health care provider may also recommend other immunizations. This information is not  intended to replace advice given to you by your health care provider. Make sure you discuss any questions you have with your health care provider. Document Released: 08/04/2005 Document Revised: 12/31/2015 Document Reviewed: 03/16/2015  2017 [employer]

## 2016-06-22 NOTE — Assessment & Plan Note (Signed)
Discussed screening

## 2016-06-22 NOTE — Assessment & Plan Note (Signed)
USPSTF grade A and B recommendations reviewed with patient; age-appropriate recommendations, preventive care, screening tests, etc discussed and encouraged; healthy living encouraged; see AVS for patient education given to patient  

## 2016-06-22 NOTE — Progress Notes (Signed)
Patient: Sydney Mcdaniel, Female    DOB: 1924/08/30, 82 y.o.   MRN: [id number]  Visit Date: 06/22/2016  Today's Provider: Enid Derry, MD   Chief Complaint  Patient presents with  . Annual Exam    Subjective:   Sydney Mcdaniel is a 82 y.o. female who presents today for her Subsequent Annual Wellness Visit.  Caregiver input:  Daughter  Since last visit, she has had work-up by cardiologist; she has had a few syncopal episodes, has been to the hospital; reviewed hospital records and records from cardiologist; she just saw [name], NP on 06/16/16; her BP there was 132/60; she has bilateral pleural effusions and he is getting a f/u CXR in a few weeks to ensure resolution and exclude malignancy; he thinks diastolic failure off of antihypertensives triggered these; he's managing her furosemide and will get bmp They had trouble with the cellular coverage for the 30 day holter monitor and are working to get one that has range where she lives For BP, he added back amlodipine 5 mg daily and will try to wean her off of furosemide soon  HPI  Review of Systems  Past Medical History:  Diagnosis Date  . Allergy   . Anemia   . Cataract   . Glaucoma   . H/O: hysterectomy    Total  . Hyperlipidemia   . Hypertension   . Hypothyroidism   . Impaired fasting glucose   . Lichen sclerosus   . Osteoporosis    Hips  . Syncope    a. 03/2016 Echo: EF55-60%, no rwma, mild AI/MR, nl PASP; b. 03/2016 48h Holter: no significant arrhythmias/pauses; c. 03/2016 MV: mild apical defect, likely breast attenuation, nl EF, low risk.    Past Surgical History:  Procedure Laterality Date  . APPENDECTOMY    . EYE SURGERY     Glaucoma  . TOTAL ABDOMINAL HYSTERECTOMY     Family History  Problem Relation Age of Onset  . Heart attack Mother   . Glaucoma Mother   . Heart attack Father   . Parkinson's disease Brother   . Heart attack Brother    Social History   Social History  . Marital status:  Married    Spouse name: N/A  . Number of children: N/A  . Years of education: N/A   Occupational History  . Not on file.   Social History Main Topics  . Smoking status: Never Smoker  . Smokeless tobacco: Never Used  . Alcohol use No  . Drug use: No  . Sexual activity: No   Other Topics Concern  . Not on file   Social History Narrative  . No narrative on file    Outpatient Encounter Prescriptions as of 06/22/2016  Medication Sig Note  . ACCU-CHEK SOFTCLIX LANCETS lancets Use as instructed; check once a day; H40.1131   . amLODipine (NORVASC) 5 MG tablet Take 1 tablet (5 mg total) by mouth daily. 06/22/2016: Only taking when her BP is above  140   . aspirin 81 MG tablet Take 81 mg by mouth daily.   Marland Kitchen atorvastatin (LIPITOR) 20 MG tablet Take 1 tablet (20 mg total) by mouth at bedtime.   . Calcium Carb-Cholecalciferol (CALCIUM 600 + D PO) Take by mouth daily.   . furosemide (LASIX) 20 MG tablet Take 1 tablet (20 mg total) by mouth daily. (Patient taking differently: Take 20 mg by mouth daily. )   . glucose blood (ACCU-CHEK AVIVA PLUS) test strip Use as instructed, check once  a day; H40.1131   . ipratropium (ATROVENT) 0.03 % nasal spray Place 2 sprays into both nostrils 3 (three) times daily. (Patient taking differently: Place 2 sprays into both nostrils daily as needed. )   . latanoprost (XALATAN) 0.005 % ophthalmic solution Place 1 drop into the right eye at bedtime.    Marland Kitchen levofloxacin (LEVAQUIN) 750 MG tablet Take 1 tablet (750 mg total) by mouth daily.   Marland Kitchen levothyroxine (SYNTHROID, LEVOTHROID) 75 MCG tablet One by mouth on Mondays, Wednesdays, and Fridays   . levothyroxine (SYNTHROID, LEVOTHROID) 88 MCG tablet One by mouth on Tuesdays, Thursdays, Saturdays, and Sundays   . Multiple Vitamins-Minerals (MULTIVITAMIN ADULT PO) Take by mouth.   . Omega-3 Fatty Acids (FISH OIL) 1200 MG CAPS Take by mouth daily.   . timolol (TIMOPTIC) 0.5 % ophthalmic solution Place 1 drop into the right  eye 2 (two) times daily.     No facility-administered encounter medications on file as of 06/22/2016.    Functional Ability / Safety Screening 1.  Was the timed Get Up and Go test longer than 30 seconds?  no 2.  Does the patient need help with the phone, transportation, shopping,      preparing meals, housework, laundry, medications, or managing money?  yes, daughter helps with transportation 3.  Does the patient's home have:  loose throw rugs in the hallway?   no      Grab bars in the bathroom? yes      Handrails on the stairs?   yes      Poor lighting?   no 4.  Has the patient noticed any hearing difficulties?   no  Fall Risk Assessment See under rooming  Depression Screen See under rooming Depression screen [facility] 2/9 06/22/2016 03/22/2016 02/25/2016 12/21/2015 06/22/2015  Decreased Interest 0 0 0 0 0  Down, Depressed, Hopeless 0 0 0 0 1  PHQ - 2 Score 0 0 0 0 1  Altered sleeping - - - - -  Tired, decreased energy - - - - -  Change in appetite - - - - -  Feeling bad or failure about yourself  - - - - -  Trouble concentrating - - - - -  Moving slowly or fidgety/restless - - - - -  Suicidal thoughts - - - - -  PHQ-9 Score - - - - -    Advanced Directives Does patient have a HCPOA?    yes Does patient have a living will or MOST form?  no; full code but would not want to survive hooked up to machines indefinitely if no hope  Objective:   Vitals: BP (!) 142/74 (BP Location: Left Arm, Patient Position: Sitting, Cuff Size: Normal)   Pulse 73   Temp 98.1 F (36.7 C) (Oral)   Resp 14   Ht 5\' 2"  (1.575 m)   Wt 135 lb 8 oz (61.5 kg)   LMP  (LMP Unknown)   SpO2 98%   BMI 24.78 kg/m  Body mass index is 24.78 kg/m. No exam data present  Physical Exam Mood/affect:  euthymic Appearance:  Neat and casual  Cognitive Testing - 6-CIT  Correct? Score   What year is it? yes 0 Yes = 0    No = 4  What month is it? yes 0 Yes = 0    No = 3  Remember:     Sydney Mcdaniel, [address], [place]     What time is it? yes 0 Yes =  0    No = 3  Count backwards from 20 to 1 yes 2 Correct = 0    1 error = 2   More than 1 error = 4  Say the months of the year in reverse. yes 0 Correct = 0    1 error = 2   More than 1 error = 4  What address did I ask you to remember? no 4 Correct = 0  1 error = 2    2 error = 4    3 error = 6    4 error = 8    All wrong = 10       TOTAL SCORE  6/28   Interpretation:  Normal  Normal (0-7) Abnormal (8-28)    Assessment & Plan:     Annual Wellness Visit  Reviewed patient's Family Medical History Reviewed and updated list of patient's medical providers Assessment of cognitive impairment was done Assessed patient's functional ability Established a written schedule for health screening [place] Completed and Reviewed  Immunization History  Administered Date(s) Administered  . Influenza, High Dose Seasonal PF 03/22/2016  . Influenza,inj,Quad PF,36+ Mos 04/13/2015  . Influenza-Unspecified 03/18/2014  . Pneumococcal Conjugate-13 12/10/2013  . Pneumococcal Polysaccharide-23 04/13/2008  . Td 01/22/2004  . Zoster 04/10/2006    Health Maintenance  Topic Date Due  . Samul Dada  01/21/2014  . INFLUENZA VACCINE  Completed  . DEXA SCAN  Addressed  . ZOSTAVAX  Completed  . PNA vac Low Risk Adult  Completed    Discussed health benefits of physical activity, and encouraged her to engage in regular exercise appropriate for her age and condition.   No orders of the defined types were placed in this encounter.   Current Outpatient Prescriptions:  .  ACCU-CHEK SOFTCLIX LANCETS lancets, Use as instructed; check once a day; H40.1131, Disp: 100 each, Rfl: 12 .  amLODipine (NORVASC) 5 MG tablet, Take 1 tablet (5 mg total) by mouth daily., Disp: 30 tablet, Rfl: 6 .  aspirin 81 MG tablet, Take 81 mg by mouth daily., Disp: , Rfl:  .  atorvastatin (LIPITOR) 20 MG tablet, Take 1 tablet (20 mg total) by mouth at bedtime., Disp:  90 tablet, Rfl: 3 .  Calcium Carb-Cholecalciferol (CALCIUM 600 + D PO), Take by mouth daily., Disp: , Rfl:  .  furosemide (LASIX) 20 MG tablet, Take 1 tablet (20 mg total) by mouth daily. (Patient taking differently: Take 20 mg by mouth daily. ), Disp: 30 tablet, Rfl: 0 .  glucose blood (ACCU-CHEK AVIVA PLUS) test strip, Use as instructed, check once a day; H40.1131, Disp: 100 each, Rfl: 3 .  ipratropium (ATROVENT) 0.03 % nasal spray, Place 2 sprays into both nostrils 3 (three) times daily. (Patient taking differently: Place 2 sprays into both nostrils daily as needed. ), Disp: 90 mL, Rfl: 3 .  latanoprost (XALATAN) 0.005 % ophthalmic solution, Place 1 drop into the right eye at bedtime. , Disp: , Rfl:  .  levofloxacin (LEVAQUIN) 750 MG tablet, Take 1 tablet (750 mg total) by mouth daily., Disp: 5 tablet, Rfl: 0 .  levothyroxine (SYNTHROID, LEVOTHROID) 75 MCG tablet, One by mouth on Mondays, Wednesdays, and Fridays, Disp: 40 tablet, Rfl: 1 .  levothyroxine (SYNTHROID, LEVOTHROID) 88 MCG tablet, One by mouth on Tuesdays, Thursdays, Saturdays, and Sundays, Disp: 52 tablet, Rfl: 1 .  Multiple Vitamins-Minerals (MULTIVITAMIN ADULT PO), Take by mouth., Disp: , Rfl:  .  Omega-3 Fatty Acids (FISH OIL) 1200 MG  CAPS, Take by mouth daily., Disp: , Rfl:  .  timolol (TIMOPTIC) 0.5 % ophthalmic solution, Place 1 drop into the right eye 2 (two) times daily. , Disp: , Rfl:  There are no discontinued medications.  Next Medicare Wellness Visit in 12+ months

## 2016-06-23 ENCOUNTER — Encounter: Payer: Medicare Other | Admitting: Cardiology

## 2016-06-27 ENCOUNTER — Other Ambulatory Visit: Payer: Self-pay | Admitting: *Deleted

## 2016-06-27 ENCOUNTER — Telehealth: Payer: Self-pay

## 2016-06-27 ENCOUNTER — Ambulatory Visit
Admission: RE | Admit: 2016-06-27 | Discharge: 2016-06-27 | Disposition: A | Discharge status: Home / Self Care | Payer: Medicare Other | Source: Ambulatory Visit | Attending: Nurse Practitioner | Admitting: Nurse Practitioner

## 2016-06-27 ENCOUNTER — Other Ambulatory Visit
Admission: RE | Admit: 2016-06-27 | Discharge: 2016-06-27 | Disposition: A | Discharge status: Home / Self Care | Payer: Medicare Other | Source: Ambulatory Visit | Attending: Physician Assistant | Admitting: Physician Assistant

## 2016-06-27 DIAGNOSIS — J90 Pleural effusion, not elsewhere classified: Secondary | ICD-10-CM | POA: Diagnosis not present

## 2016-06-27 DIAGNOSIS — R55 Syncope and collapse: Secondary | ICD-10-CM

## 2016-06-27 DIAGNOSIS — I110 Hypertensive heart disease with heart failure: Secondary | ICD-10-CM | POA: Diagnosis not present

## 2016-06-27 DIAGNOSIS — I509 Heart failure, unspecified: Secondary | ICD-10-CM | POA: Diagnosis not present

## 2016-06-27 DIAGNOSIS — I10 Essential (primary) hypertension: Secondary | ICD-10-CM

## 2016-06-27 DIAGNOSIS — E039 Hypothyroidism, unspecified: Secondary | ICD-10-CM

## 2016-06-27 LAB — BASIC METABOLIC PANEL
Anion gap: 6 (ref 5–15)
BUN: 12 mg/dL (ref 6–20)
CO2: 27 mmol/L (ref 22–32)
CREATININE: 0.62 mg/dL (ref 0.44–1.00)
Calcium: 8.7 mg/dL — ABNORMAL LOW (ref 8.9–10.3)
Chloride: 101 mmol/L (ref 101–111)
GFR calc Af Amer: >60 mL/min (ref >60)
GLUCOSE: 99 mg/dL (ref 65–99)
POTASSIUM: 4.2 mmol/L (ref 3.5–5.1)
SODIUM: 134 mmol/L — AB (ref 135–145)

## 2016-06-27 LAB — TSH: TSH: 2.158 u[IU]/mL (ref 0.350–4.500)

## 2016-06-27 NOTE — Assessment & Plan Note (Signed)
Check TSH six weeks after changing manufacturer

## 2016-06-27 NOTE — Telephone Encounter (Signed)
That's fine We will just want to recheck her TSH six weeks after she starts the med from the new manufacturer (I'll enter); thanks

## 2016-06-27 NOTE — Telephone Encounter (Signed)
[place] called Juanda Crumble, the pharmacist) is asking top change her manufacture for her thyroid medicine.

## 2016-06-27 NOTE — Telephone Encounter (Signed)
Daughter and pharmacy notified

## 2016-06-28 ENCOUNTER — Encounter: Payer: Self-pay | Admitting: Cardiology

## 2016-06-28 ENCOUNTER — Ambulatory Visit (INDEPENDENT_AMBULATORY_CARE_PROVIDER_SITE_OTHER): Payer: Medicare Other | Admitting: Cardiology

## 2016-06-28 VITALS — BP 174/63 | HR 65 | Ht 62.0 in | Wt 132.2 lb

## 2016-06-28 DIAGNOSIS — J90 Pleural effusion, not elsewhere classified: Secondary | ICD-10-CM | POA: Diagnosis not present

## 2016-06-28 DIAGNOSIS — I5032 Chronic diastolic (congestive) heart failure: Secondary | ICD-10-CM | POA: Insufficient documentation

## 2016-06-28 DIAGNOSIS — R0602 Shortness of breath: Secondary | ICD-10-CM

## 2016-06-28 DIAGNOSIS — R55 Syncope and collapse: Secondary | ICD-10-CM | POA: Diagnosis not present

## 2016-06-28 DIAGNOSIS — I10 Essential (primary) hypertension: Secondary | ICD-10-CM | POA: Diagnosis not present

## 2016-06-28 DIAGNOSIS — E784 Other hyperlipidemia: Secondary | ICD-10-CM

## 2016-06-28 DIAGNOSIS — E7849 Other hyperlipidemia: Secondary | ICD-10-CM

## 2016-06-28 NOTE — Patient Instructions (Signed)
Medication Instructions:  Your physician has recommended you make the following change in your medication:  1. Continue Lasix 10 mg daily for one week    Labwork: Your physician recommends that you return for lab work in: 1 week to have BMP done on 07/05/16. [facility] to have [facility] done.   Follow-Up: Your physician recommends that you schedule a follow-up appointment in: 2 weeks with Dr. Yvone Neu  It was a pleasure seeing you today here in the office. Please do not hesitate to give [place] a call back if you have any further questions. [place], BSN

## 2016-06-28 NOTE — Progress Notes (Signed)
Cardiology Office Note   Date:  06/28/2016   ID:  Sydney Mcdaniel, DOB [birth year], MRN [id number]  Referring Doctor:  Enid Derry, MD   Cardiologist:   Wende Bushy, MD   Reason for consultation:  Chief Complaint  Patient presents with  . other    Follow up from syncope and discuss her 30 day event monitor; pt. still wearing. Meds reviewed by the pt. verbally.   Episode of passing out, bradycardia    History of Present Illness: Sydney Mcdaniel is a 83 y.o. female who presents for Follow-up after hospital stay.  Review of medical records show: October 2017: Episode occurred some time a week ago. Patient was at home. The daughter just left around 2:30 or 3:30 in the afternoon to go back to her own house. Patient then walked outside to go to the mailbox. She then decided to do some cleaning in the yard picking up some twigs she probably spent around 10-15 minutes in the yard. It was a very hot day. She felt like she needed to go back to the house and drink something cold. She got to her kitchen. She opened up a bottle of likely [facility], poured into her glass. She remembers putting back the lid on the bottle. And after that, the next thing she remembers was that she was on the ground. She fell down to her sideways, and came to when she heard the alarm of her monitor going off. She was unable to get up and she will felt weak. Based on the timing and the recollection from her and from her daughter, she was likely out for less than a minute or so. She denies getting  chest pain or shortness of breath or palpitations before the episode. All she could remember was she felt she needed to drink something as she will it was very hot outside.  She then started checking her blood pressure after that event. She noticed that the top number was running relatively low in the 100s and 100 teens. Previously was in the 120s and 130s. That was something new for her. Next para she eventually followed up  for her PCP and the thinking was the episode may have been related to relative hypotension, especially on a very hot day. Medications were adjusted. Amlodipine was stopped. Lisinopril was reduced to half her usual dose.  Since then, her blood pressures at the top in the 120s and 130s again. Today's blood pressure is unusually high for her in the field that she may be anxious about today's visit.   Since that last visit in 03/2016, review of records show: Stress testing was nonischemic. Echo showed normal LV function. 48 hour Holter did not reveal any significant arrhythmias or pauses. There was some concern that maybe low blood pressure was playing a role in her medications were adjusted by her primary care provider. In late November 2017, she had recurrent near syncope that occurred after awaking and feeling poorly with chest discomfort. EMS came and felt she was stable however one she stood up, she became diaphoretic and lightheaded. She was taken to [facility] and subsequently admitted. Cardiac markers were negative. She was seen by our team and noted to have a 4.2 second pause during sleep and was evaluated by electrophysiology. 30 day event monitor was recommended. Blood pressures fluctuated quite a bit and she was discharged off of all antihypertensives. She had previously been taking lisinopril 10 mg and amlodipine 5 mg daily.  She was seen by NP on 12/19 c/o fatigue, rising BPs @ home, DOE, and cough.  Wt was up 8 lbs above baseline.  She had very diminished breath sounds in the left base.  CXR was performed and showed L>R pleural effusions.  PNA could not be ruled out and radiology rec f/u CXR after abx course. Started low dose lasix for suspicion that diast failure in the setting of elevated BPs while off of previous antihypertensive Rx . Amlodipine 5 g also started. On the cecum visit 06/16/2016, patient was feeling much better and responded to the medication. Repeat x-ray was planned prior  to next follow-up.  On 06/17/2016, patient had another episode of passing out at home. Noted at breakfast table, shortly after waking up that day. No complaints of chest pain or shortness of breath. She was sitting at the table when it happened. She was brought to the floor by family when she failed to respond appropriately and sluggish to respond. Episode lasted only a few seconds. Blood pressure was checked around that time and was noted to be relatively much lower, with systolics in the [id number]. At that point, called to cardiology was made to. Covering nurse practitioner instructed him to reduce the Lasix to every other day, and to take amlodipine only when blood pressure systolic is more than [id number].  We had an extensive review of her weights taken one to 3 times in a day and what dose of Lasix was used. We also reviewed extensively her blood pressure measurements. Based on this, it appears that she had been taking Lasix half a tablet every day on the average, essentially 10 mg daily.. Also, she has been taking amlodipine 5 mg every day as well since her blood pressures have been running greater than [id number] systolic.  Overall she feels better. Shortness of breath is improving. No PND, and orthopnea. No significant edema. No chest pain. No further episodes of passing out. No palpitations. They had issues with the event monitor in terms of transmission. She is due to submitted that's end of this month.  ROS:  Please see the history of present illness. Aside from mentioned under HPI, all other systems are reviewed and negative.     Past Medical History:  Diagnosis Date  . Allergy   . Anemia   . Cataract   . Glaucoma   . H/O: hysterectomy    Total  . Hyperlipidemia   . Hypertension   . Hypothyroidism   . Impaired fasting glucose   . Lichen sclerosus   . Osteoporosis    Hips  . Syncope    a. 03/2016 Echo: EF55-60%, no rwma, mild AI/MR, nl PASP; b. 03/2016 48h Holter: no significant  arrhythmias/pauses; c. 03/2016 MV: mild apical defect, likely breast attenuation, nl EF, low risk.    Past Surgical History:  Procedure Laterality Date  . APPENDECTOMY    . EYE SURGERY     Glaucoma  . TOTAL ABDOMINAL HYSTERECTOMY       reports that she has never smoked. She has never used smokeless tobacco. She reports that she does not drink alcohol or use drugs.   family history includes Glaucoma in her mother; Heart attack in her brother, father, and mother; Parkinson's disease in her brother.   Outpatient Medications Prior to Visit  Medication Sig Dispense Refill  . ACCU-CHEK SOFTCLIX LANCETS lancets Use as instructed; check once a day; H40.1131 100 each 12  . amLODipine (NORVASC) 5 MG tablet Take 1 tablet (5 mg  total) by mouth daily. 30 tablet 6  . aspirin 81 MG tablet Take 81 mg by mouth daily.    Marland Kitchen atorvastatin (LIPITOR) 20 MG tablet Take 1 tablet (20 mg total) by mouth at bedtime. 90 tablet 3  . Calcium Carb-Cholecalciferol (CALCIUM 600 + D PO) Take by mouth daily.    . furosemide (LASIX) 20 MG tablet Take 1 tablet (20 mg total) by mouth daily. (Patient taking differently: Take 20 mg by mouth daily. ) 30 tablet 0  . glucose blood (ACCU-CHEK AVIVA PLUS) test strip Use as instructed, check once a day; H40.1131 100 each 3  . ipratropium (ATROVENT) 0.03 % nasal spray Place 2 sprays into both nostrils 3 (three) times daily. (Patient taking differently: Place 2 sprays into both nostrils daily as needed. ) 90 mL 3  . latanoprost (XALATAN) 0.005 % ophthalmic solution Place 1 drop into the right eye at bedtime.     Marland Kitchen levothyroxine (SYNTHROID, LEVOTHROID) 75 MCG tablet One by mouth on Mondays, Wednesdays, and Fridays 40 tablet 1  . levothyroxine (SYNTHROID, LEVOTHROID) 88 MCG tablet One by mouth on Tuesdays, Thursdays, Saturdays, and Sundays 52 tablet 1  . Multiple Vitamins-Minerals (MULTIVITAMIN ADULT PO) Take by mouth.    . Omega-3 Fatty Acids (FISH OIL) 1200 MG CAPS Take by mouth daily.     . timolol (TIMOPTIC) 0.5 % ophthalmic solution Place 1 drop into the right eye 2 (two) times daily.     Marland Kitchen levofloxacin (LEVAQUIN) 750 MG tablet Take 1 tablet (750 mg total) by mouth daily. (Patient not taking: Reported on 06/28/2016) 5 tablet 0   No facility-administered medications prior to visit.      Allergies: Alphagan [brimonidine] and Pravachol [pravastatin sodium]    PHYSICAL EXAM: VS:  BP (!) 174/63 (BP Location: Left Arm, Patient Position: Sitting, Cuff Size: Normal)   Pulse 65   Ht 5\' 2"  (1.575 m)   Wt 132 lb 4 oz (60 kg)   LMP  (LMP Unknown)   BMI 24.19 kg/m  , Body mass index is 24.19 kg/m. Wt Readings from Last 3 Encounters:  06/28/16 132 lb 4 oz (60 kg)  06/22/16 135 lb 8 oz (61.5 kg)  06/16/16 135 lb 12 oz (61.6 kg)    GENERAL:  well developed, well nourished, not in acute distress HEENT: normocephalic, pink conjunctivae, anicteric sclerae, no xanthelasma, normal dentition, oropharynx clear NECK:  no neck vein engorgement, JVP normal, no hepatojugular reflux, carotid upstroke brisk and symmetric, no bruit, no thyromegaly, no lymphadenopathy LUNGS:  good respiratory effort, clear to auscultation bilaterally CV:  PMI not displaced, no thrills, no lifts, S1 and S2 within normal limits, no palpable S3 or S4, no murmurs, no rubs, no gallops ABD:  Soft, nontender, nondistended, normoactive bowel sounds, no abdominal aortic bruit, no hepatomegaly, no splenomegaly MS: nontender back, no kyphosis, no scoliosis, no joint deformities EXT:  2+ DP/PT pulses, no edema, no varicosities, no cyanosis, no clubbing SKIN: warm, nondiaphoretic, normal turgor, no ulcers NEUROPSYCH: alert, oriented to person, place, and time, sensory/motor grossly intact, normal mood, appropriate affect  Recent Labs: 12/21/2015: Magnesium 2.2 04/21/2016: ALT 18 05/24/2016: Hemoglobin 10.7; Platelets 188 06/27/2016: BUN 12; Creatinine, Ser 0.62; Potassium 4.2; Sodium 134; TSH 2.158   Lipid Panel      Component Value Date/Time   CHOL 128 12/21/2015 0950   CHOL 141 08/24/2015 0856   TRIG 111 12/21/2015 0950   HDL 62 12/21/2015 0950   HDL 52 08/24/2015 0856   CHOLHDL 2.1 12/21/2015 0950  VLDL 22 12/21/2015 0950   LDLCALC 44 12/21/2015 0950   LDLCALC 64 08/24/2015 0856     Other studies Reviewed:  EKG:  The ekg from 03/29/2016 was personally reviewed by me and it revealed sinus rhythm, 71 BPM. First-degree AV block. Incomplete left bundle-branch block.  Additional studies/ records that were reviewed personally reviewed by me today include:  Echo 04/17/2016: Left ventricle: The cavity size was normal. Systolic function was   normal. The estimated ejection fraction was in the range of 55%   to 60%. Wall motion was normal; there were no regional wall   motion abnormalities. Left ventricular diastolic function   parameters were normal. - Aortic valve: There was mild regurgitation. - Mitral valve: There was mild regurgitation. - Left atrium: The atrium was normal in size. - Right ventricle: Systolic function was normal. - Pulmonary arteries: Systolic pressure was within the normal   range.  Holter 04/26/2016: Overall rhythm was sinus. The heart rate ranged from 44-85 bpm, average of 60 BPM. Note of first-degree AV block. Longest RR interval was within normal limits at 1.44 seconds.  Supraventricular ectopy: 274 isolated PACs, 48 atrial couplets. 2 short atrial runs - 4 beats at 87 BPM, 4 beats at 95 BPM.  Ventricular ectopy: 28 isolated PVCs.  No evidence of atrial fibrillation.  Nuclear stress is 04/10/2016:  There was no ST segment deviation noted during stress.  Defect 1: There is a small defect of mild severity present in the apex location. This is likely due to breast attenuation.  The study is normal.  This is a low risk study.  The left ventricular ejection fraction is normal (55-65%).   ASSESSMENT AND PLAN:  Episode of passing out  History of slow  heart rate Abnormal EKG with first-degree AV block and incomplete left bundle branch block normal LV function on echo, no ischemia on Myoview, and no evidence of significant pauses or arrhythmia on monitoring.  She was recently admitted to [facility] with presyncope that occurred after standing at home. This was felt to be most likely secondary to orthostasis. During admission, she was noted to have a 4.1 second pause during sleep. She was seen by Dr. Caryl Comes from our team with recommendation for 30 day event monitor.  Most recent episode 12/23 also most likely consistent with orthostasis.  No recurrent Ss.  She now has a 30 day event monitor on (initially issues w/ transmission on first monitor she was sent).  Recommend to continue blood pressure monitoring, daily weights. May benefit from compression stockings. Recommend to increase hydration with water plus minus low calorie gatorade.   Hypertension Review blood pressure log reveals that her blood pressure since 06/17/2016 has consistently been greater than [id number] systolic. In essence, she has been taking amlodipine 5 mg by mouth daily. Continue monitoring BP. Continue current medical therapy and lifestyle changes.   Episode of shortness of breath and weight gain, likely related to acute on chronic diastolic congestive heart failure Pleural effusion, improved on repeat chest x-ray No evidence of congestive heart failure currently Review of daily weights sometimes up to 3 measurements in a day was done. Also in essence, she has been needing to take Lasix 10 mg by mouth daily. Recommend to continue this for one more week and repeat BMP next week. Continue just once a day weight in the morning.  Hyperlipidemia PCP following labs   Current medicines are reviewed at length with the patient today.  The patient does not have concerns regarding  medicines.  Labs/ tests ordered today include:  Orders Placed This Encounter  Procedures  . EKG  12-Lead    I had a lengthy and detailed discussion with the patient regarding diagnoses, prognosis, diagnostic options, treatment options , and side effects of medications.   I counseled the patient on importance of lifestyle modification including heart healthy diet, regular physical activity once cardiac workup is completed   Disposition:   FU with undersigned In 2 weeks   I spent at least 40 minutes with the patient today and more than 50% of the time was spent counseling the patient and coordinating care.   Signed, Wende Bushy, MD  06/28/2016 10:16 AM    [place]  This note was generated in part with voice recognition software and I apologize for any typographical errors that were not detected and corrected.

## 2016-06-30 ENCOUNTER — Ambulatory Visit: Payer: Medicare Other

## 2016-07-05 ENCOUNTER — Other Ambulatory Visit
Admission: RE | Admit: 2016-07-05 | Discharge: 2016-07-05 | Disposition: A | Discharge status: Home / Self Care | Payer: Medicare Other | Source: Ambulatory Visit | Attending: Cardiology | Admitting: Cardiology

## 2016-07-05 DIAGNOSIS — R55 Syncope and collapse: Secondary | ICD-10-CM | POA: Diagnosis not present

## 2016-07-05 LAB — BASIC METABOLIC PANEL
Anion gap: 7 (ref 5–15)
BUN: 9 mg/dL (ref 6–20)
CO2: 26 mmol/L (ref 22–32)
CREATININE: 0.73 mg/dL (ref 0.44–1.00)
Calcium: 8.9 mg/dL (ref 8.9–10.3)
Chloride: 98 mmol/L — ABNORMAL LOW (ref 101–111)
GFR calc Af Amer: >60 mL/min (ref >60)
Glucose, Bld: 103 mg/dL — ABNORMAL HIGH (ref 65–99)
Potassium: 4.2 mmol/L (ref 3.5–5.1)
SODIUM: 131 mmol/L — AB (ref 135–145)

## 2016-07-10 ENCOUNTER — Inpatient Hospital Stay: Payer: Medicare Other | Attending: Hematology and Oncology

## 2016-07-10 DIAGNOSIS — E785 Hyperlipidemia, unspecified: Secondary | ICD-10-CM | POA: Insufficient documentation

## 2016-07-10 DIAGNOSIS — D649 Anemia, unspecified: Secondary | ICD-10-CM | POA: Diagnosis not present

## 2016-07-10 DIAGNOSIS — C9110 Chronic lymphocytic leukemia of B-cell type not having achieved remission: Secondary | ICD-10-CM | POA: Insufficient documentation

## 2016-07-10 DIAGNOSIS — Z7982 Long term (current) use of aspirin: Secondary | ICD-10-CM | POA: Diagnosis not present

## 2016-07-10 DIAGNOSIS — H409 Unspecified glaucoma: Secondary | ICD-10-CM | POA: Diagnosis not present

## 2016-07-10 DIAGNOSIS — Z90710 Acquired absence of both cervix and uterus: Secondary | ICD-10-CM | POA: Diagnosis not present

## 2016-07-10 DIAGNOSIS — E538 Deficiency of other specified B group vitamins: Secondary | ICD-10-CM | POA: Diagnosis not present

## 2016-07-10 DIAGNOSIS — I10 Essential (primary) hypertension: Secondary | ICD-10-CM | POA: Diagnosis not present

## 2016-07-10 DIAGNOSIS — Z79899 Other long term (current) drug therapy: Secondary | ICD-10-CM | POA: Diagnosis not present

## 2016-07-10 DIAGNOSIS — E039 Hypothyroidism, unspecified: Secondary | ICD-10-CM | POA: Diagnosis not present

## 2016-07-10 MED ORDER — CYANOCOBALAMIN 1000 MCG/ML IJ SOLN
1000.0000 ug | Freq: Once | INTRAMUSCULAR | Status: AC
  Administered 2016-07-10: 1000 ug via INTRAMUSCULAR

## 2016-07-11 ENCOUNTER — Encounter: Payer: Self-pay | Admitting: Cardiology

## 2016-07-11 ENCOUNTER — Ambulatory Visit (INDEPENDENT_AMBULATORY_CARE_PROVIDER_SITE_OTHER): Payer: Medicare Other | Admitting: Cardiology

## 2016-07-11 VITALS — BP 160/72 | HR 67 | Ht 62.5 in | Wt 131.0 lb

## 2016-07-11 DIAGNOSIS — E784 Other hyperlipidemia: Secondary | ICD-10-CM | POA: Diagnosis not present

## 2016-07-11 DIAGNOSIS — I5032 Chronic diastolic (congestive) heart failure: Secondary | ICD-10-CM

## 2016-07-11 DIAGNOSIS — I10 Essential (primary) hypertension: Secondary | ICD-10-CM

## 2016-07-11 DIAGNOSIS — E7849 Other hyperlipidemia: Secondary | ICD-10-CM

## 2016-07-11 MED ORDER — LISINOPRIL 5 MG PO TABS: 5.0000 mg | ORAL_TABLET | Freq: Every day | ORAL | 3 refills | Status: DC

## 2016-07-11 NOTE — Progress Notes (Signed)
Cardiology Office Note   Date:  07/11/2016   ID:  Sydney Mcdaniel, DOB 17-Dec-1924, MRN [id number]  Referring Doctor:  Enid Derry, MD   Cardiologist:   Wende Bushy, MD   Reason for consultation:  Chief Complaint  Patient presents with  . othe r    2 week follow up. Meds reviewed by the pt. verbally. "doing well."      History of Present Illness: Sydney Mcdaniel is a 83 y.o. female who presents for Follow-up for hypertension   Review medical records show: October 2017: Episode occurred some time a week ago. Patient was at home. The daughter just left around 2:30 or 3:30 in the afternoon to go back to her own house. Patient then walked outside to go to the mailbox. She then decided to do some cleaning in the yard picking up some twigs she probably spent around 10-15 minutes in the yard. It was a very hot day. She felt like she needed to go back to the house and drink something cold. She got to her kitchen. She opened up a bottle of likely [facility], poured into her glass. She remembers putting back the lid on the bottle. And after that, the next thing she remembers was that she was on the ground. She fell down to her sideways, and came to when she heard the alarm of her monitor going off. She was unable to get up and she will felt weak. Based on the timing and the recollection from her and from her daughter, she was likely out for less than a minute or so. She denies getting  chest pain or shortness of breath or palpitations before the episode. All she could remember was she felt she needed to drink something as she will it was very hot outside.  She then started checking her blood pressure after that event. She noticed that the top number was running relatively low in the 100s and 100 teens. Previously was in the 120s and 130s. That was something new for her. Next para she eventually followed up for her PCP and the thinking was the episode may have been related to relative  hypotension, especially on a very hot day. Medications were adjusted. Amlodipine was stopped. Lisinopril was reduced to half her usual dose.  Stress testing was nonischemic. Echo showed normal LV function. 48 hour Holter did not reveal any significant arrhythmias or pauses. There was some concern that maybe low blood pressure was playing a role in her medications were adjusted by her primary care provider. In late November 2017, she had recurrent near syncope that occurred after awaking and feeling poorly with chest discomfort. EMS came and felt she was stable however one she stood up, she became diaphoretic and lightheaded. She was taken to [facility] and subsequently admitted. Cardiac markers were negative. She was seen by our team and noted to have a 4.2 second pause during sleep and was evaluated by electrophysiology. 30 day event monitor was recommended. Blood pressures fluctuated quite a bit and she was discharged off of all antihypertensives. She had previously been taking lisinopril 10 mg and amlodipine 5 mg daily.  She was seen by NP on 12/19 c/o fatigue, rising BPs @ home, DOE, and cough.  Wt was up 8 lbs above baseline.  She had very diminished breath sounds in the left base.  CXR was performed and showed L>R pleural effusions.  PNA could not be ruled out and radiology rec f/u CXR after abx  course. Started low dose lasix for suspicion that diast failure in the setting of elevated BPs while off of previous antihypertensive Rx . Amlodipine 5 g also started. On the cecum visit 06/16/2016, patient was feeling much better and responded to the medication. Repeat x-ray was planned prior to next follow-up.  On 06/17/2016, patient had another episode of passing out at home. Noted at breakfast table, shortly after waking up that day. No complaints of chest pain or shortness of breath. She was sitting at the table when it happened. She was brought to the floor by family when she failed to respond  appropriately and sluggish to respond. Episode lasted only a few seconds. Blood pressure was checked around that time and was noted to be relatively much lower, with systolics in the [id number]. At that point, called to cardiology was made to. Covering nurse practitioner instructed him to reduce the Lasix to every other day, and to take amlodipine only when blood pressure systolic is more than [id number].  Since last visit, she has finished her course of diuretic. She continues to take amlodipine 5 mg daily. She's been feeling well, no lightheadedness. Her blood pressures remain to be okay, actually somewhat on the higher side, in the 0000000 to 123456 systolic. Her weight is stable. She denies chest pain, shortness of breath, PND, orthopnea, edema. No further episodes of passing out.  ROS:  Please see the history of present illness. Aside from mentioned under HPI, all other systems are reviewed and negative.    Past Medical History:  Diagnosis Date  . Allergy   . Anemia   . Cataract   . Glaucoma   . H/O: hysterectomy    Total  . Hyperlipidemia   . Hypertension   . Hypothyroidism   . Impaired fasting glucose   . Lichen sclerosus   . Osteoporosis    Hips  . Syncope    a. 03/2016 Echo: EF55-60%, no rwma, mild AI/MR, nl PASP; b. 03/2016 48h Holter: no significant arrhythmias/pauses; c. 03/2016 MV: mild apical defect, likely breast attenuation, nl EF, low risk.    Past Surgical History:  Procedure Laterality Date  . APPENDECTOMY    . EYE SURGERY     Glaucoma  . TOTAL ABDOMINAL HYSTERECTOMY       reports that she has never smoked. She has never used smokeless tobacco. She reports that she does not drink alcohol or use drugs.   family history includes Glaucoma in her mother; Heart attack in her brother, father, and mother; Parkinson's disease in her brother.   Outpatient Medications Prior to Visit  Medication Sig Dispense Refill  . ACCU-CHEK SOFTCLIX LANCETS lancets Use as instructed; check once a  day; H40.1131 100 each 12  . amLODipine (NORVASC) 5 MG tablet Take 1 tablet (5 mg total) by mouth daily. 30 tablet 6  . aspirin 81 MG tablet Take 81 mg by mouth daily.    Marland Kitchen atorvastatin (LIPITOR) 20 MG tablet Take 1 tablet (20 mg total) by mouth at bedtime. 90 tablet 3  . Calcium Carb-Cholecalciferol (CALCIUM 600 + D PO) Take by mouth daily.    . cholecalciferol (VITAMIN D) 1000 units tablet Take 1,000 Units by mouth daily.    Marland Kitchen glucose blood (ACCU-CHEK AVIVA PLUS) test strip Use as instructed, check once a day; H40.1131 100 each 3  . ipratropium (ATROVENT) 0.03 % nasal spray Place 2 sprays into both nostrils 3 (three) times daily. (Patient taking differently: Place 2 sprays into both nostrils daily as needed. )  90 mL 3  . latanoprost (XALATAN) 0.005 % ophthalmic solution Place 1 drop into the right eye at bedtime.     Marland Kitchen levothyroxine (SYNTHROID, LEVOTHROID) 75 MCG tablet One by mouth on Mondays, Wednesdays, and Fridays 40 tablet 1  . levothyroxine (SYNTHROID, LEVOTHROID) 88 MCG tablet One by mouth on Tuesdays, Thursdays, Saturdays, and Sundays 52 tablet 1  . Multiple Vitamins-Minerals (MULTIVITAMIN ADULT PO) Take by mouth.    . Omega-3 Fatty Acids (FISH OIL) 1200 MG CAPS Take by mouth daily.    . timolol (TIMOPTIC) 0.5 % ophthalmic solution Place 1 drop into the right eye 2 (two) times daily.     . furosemide (LASIX) 20 MG tablet Take 1 tablet (20 mg total) by mouth daily. (Patient not taking: Reported on 07/11/2016) 30 tablet 0   No facility-administered medications prior to visit.      Allergies: Alphagan [brimonidine] and Pravachol [pravastatin sodium]    PHYSICAL EXAM: VS:  BP (!) 160/72 (BP Location: Left Arm, Patient Position: Sitting, Cuff Size: Normal)   Pulse 67   Ht 5' 2.5" (1.588 m)   Wt 131 lb (59.4 kg)   LMP  (LMP Unknown)   BMI 23.58 kg/m  , Body mass index is 23.58 kg/m. Wt Readings from Last 3 Encounters:  07/11/16 131 lb (59.4 kg)  06/28/16 132 lb 4 oz (60 kg)    06/22/16 135 lb 8 oz (61.5 kg)    GENERAL:  well developed, well nourished, not in acute distress HEENT: normocephalic, pink conjunctivae, anicteric sclerae, no xanthelasma, normal dentition, oropharynx clear NECK:  no neck vein engorgement, JVP normal, no hepatojugular reflux, carotid upstroke brisk and symmetric, no bruit, no thyromegaly, no lymphadenopathy LUNGS:  good respiratory effort, clear to auscultation bilaterally CV:  PMI not displaced, no thrills, no lifts, S1 and S2 within normal limits, no palpable S3 or S4, no murmurs, no rubs, no gallops ABD:  Soft, nontender, nondistended, normoactive bowel sounds, no abdominal aortic bruit, no hepatomegaly, no splenomegaly MS: nontender back, no kyphosis, no scoliosis, no joint deformities EXT:  2+ DP/PT pulses, no edema, no varicosities, no cyanosis, no clubbing SKIN: warm, nondiaphoretic, normal turgor, no ulcers NEUROPSYCH: alert, oriented to person, place, and time, sensory/motor grossly intact, normal mood, appropriate affect    Recent Labs: 12/21/2015: Magnesium 2.2 04/21/2016: ALT 18 05/24/2016: Hemoglobin 10.7; Platelets 188 06/27/2016: TSH 2.158 07/05/2016: BUN 9; Creatinine, Ser 0.73; Potassium 4.2; Sodium 131   Lipid Panel    Component Value Date/Time   CHOL 128 12/21/2015 0950   CHOL 141 08/24/2015 0856   TRIG 111 12/21/2015 0950   HDL 62 12/21/2015 0950   HDL 52 08/24/2015 0856   CHOLHDL 2.1 12/21/2015 0950   VLDL 22 12/21/2015 0950   LDLCALC 44 12/21/2015 0950   LDLCALC 64 08/24/2015 0856     Other studies Reviewed:  EKG:  The ekg from 03/29/2016 was personally reviewed by me and it revealed sinus rhythm, 71 BPM. First-degree AV block. Incomplete left bundle-branch block.  Additional studies/ records that were reviewed personally reviewed by me today include:   Echo 04/17/2016: Left ventricle: The cavity size was normal. Systolic function was   normal. The estimated ejection fraction was in the range of  55%   to 60%. Wall motion was normal; there were no regional wall   motion abnormalities. Left ventricular diastolic function   parameters were normal. - Aortic valve: There was mild regurgitation. - Mitral valve: There was mild regurgitation. - Left atrium: The atrium was  normal in size. - Right ventricle: Systolic function was normal. - Pulmonary arteries: Systolic pressure was within the normal   range.  Holter 04/26/2016: Overall rhythm was sinus. The heart rate ranged from 44-85 bpm, average of 60 BPM. Note of first-degree AV block. Longest RR interval was within normal limits at 1.44 seconds.  Supraventricular ectopy: 274 isolated PACs, 48 atrial couplets. 2 short atrial runs - 4 beats at 87 BPM, 4 beats at 95 BPM.  Ventricular ectopy: 28 isolated PVCs.  No evidence of atrial fibrillation.  Nuclear stress is 04/10/2016:  There was no ST segment deviation noted during stress.  Defect 1: There is a small defect of mild severity present in the apex location. This is likely due to breast attenuation.  The study is normal.  This is a low risk study.  The left ventricular ejection fraction is normal (55-65%).   ASSESSMENT AND PLAN:  Episode of passing out  History of slow heart rate Abnormal EKG with first-degree AV block and incomplete left bundle branch block normal LV function on echo, no ischemia on Myoview, and no evidence of significant pauses or arrhythmia on monitoring.  She was recently admitted to [facility] with presyncope that occurred after standing at home. This was felt to be most likely secondary to orthostasis. During admission, she was noted to have a 4.1 second pause during sleep. She was seen by Dr. Caryl Comes from our team with recommendation for 30 day event monitor. She is supposed to cement this back this coming Friday. Most recent episode 12/23 also most likely consistent with orthostasis.  Since last visit, she has had no recurrence. Continue  blood pressure monitoring, daily weights, compression stockings. Continue to increase hydration with water plus minus low calorie Gatorade.  Hypertension Reviewed blood pressure log again shows that her blood pressure since last visit has been in the 0000000 to 123456 systolic. Recommend to continue amlodipine 5 mg by mouth daily. Recommend to start lisinopril 5 mg daily for systolic blood pressure greater than 150s. Check BMP in 1 week. Continue current medical therapy and lifestyle changes.   Episode of shortness of breath and weight gain, likely related to acute on chronic diastolic congestive heart failure Pleural effusion, improved on repeat chest x-ray No evidence of CHF on today's visit. Recommend continue daily weights. Do not see need for Lasix at this point.  Hyperlipidemia PCP following labs   Current medicines are reviewed at length with the patient today.  The patient does not have concerns regarding medicines.  Labs/ tests ordered today include:  Orders Placed This Encounter  Procedures  . Basic Metabolic Panel (BMET)    I had a lengthy and detailed discussion with the patient regarding diagnoses, prognosis, diagnostic options, treatment options , and side effects of medications.   I counseled the patient on importance of lifestyle modification including heart healthy diet, regular physical activity once cardiac workup is completed   Disposition:   FU with undersigned In 2 months    Signed, Wende Bushy, MD  07/11/2016 12:24 PM    [place]  This note was generated in part with voice recognition software and I apologize for any typographical errors that were not detected and corrected.

## 2016-07-11 NOTE — Patient Instructions (Addendum)
Medication Instructions:  Please start Lisinopril 5 mg once a day for SBP > 150  Labwork: BMET in 1 week  Testing/Procedures: None  Follow-Up: 2 months  If you need a refill on your cardiac medications before your next appointment, please call your pharmacy.

## 2016-07-19 ENCOUNTER — Telehealth: Payer: Self-pay | Admitting: Cardiology

## 2016-07-19 ENCOUNTER — Other Ambulatory Visit (INDEPENDENT_AMBULATORY_CARE_PROVIDER_SITE_OTHER): Payer: Medicare Other

## 2016-07-19 ENCOUNTER — Other Ambulatory Visit: Payer: Self-pay

## 2016-07-19 DIAGNOSIS — I5032 Chronic diastolic (congestive) heart failure: Secondary | ICD-10-CM | POA: Diagnosis not present

## 2016-07-19 DIAGNOSIS — R55 Syncope and collapse: Secondary | ICD-10-CM

## 2016-07-19 NOTE — Telephone Encounter (Signed)
Pt daughter calling back stating pt is currently taking 1 bp pill in the morning and 1 at lunch Would like to know if patient needs to take both in the am or just one how she is doing at the moment Please advise.

## 2016-07-19 NOTE — Telephone Encounter (Signed)
Patient was here to have labs done today and brought 3 pages of blood pressure readings. They ranged from [id number] to [id number] systolic and 0000000 to [id number] diastolic. Reviewed these and chart with Dr. Yvone Neu and she recommended that patient start taking the lisinopril once daily every day verses as needed. Spoke with [name] patients daughter per release form and let her know Dr. Tora Kindred recommendations. She agreed with plan of care and had no further questions at this time.

## 2016-07-20 LAB — BASIC METABOLIC PANEL
BUN/Creatinine Ratio: 15 (ref 12–28)
BUN: 9 mg/dL (ref 8–27)
CALCIUM: 9.2 mg/dL (ref 8.7–10.3)
CO2: 22 mmol/L (ref 18–29)
CREATININE: 0.62 mg/dL (ref 0.57–1.00)
Chloride: 100 mmol/L (ref 96–106)
GFR calc Af Amer: 92 mL/min/{1.73_m2} (ref >59)
GFR, EST NON AFRICAN AMERICAN: 80 mL/min/{1.73_m2} (ref >59)
Glucose: 102 mg/dL — ABNORMAL HIGH (ref 65–99)
Potassium: 4.9 mmol/L (ref 3.5–5.2)
SODIUM: 137 mmol/L (ref 134–144)

## 2016-07-24 ENCOUNTER — Telehealth: Payer: Self-pay | Admitting: *Deleted

## 2016-07-24 NOTE — Telephone Encounter (Signed)
-----   Message from Wende Bushy, MD sent at 07/20/2016  1:40 PM EST ----- We need to let patient know that there was evidence of atrial fibrillation. She does not carry this diagnosis. Atrial fibrillation is otherwise rate controlled. Because of her stroke risk, recommend oral anticoagulation with either Xarelto or Eliquis, whichever is cost-effective per her insurance. We can discuss the details of this on her follow-up. Let us know which one is covered better and we shd send script

## 2016-07-24 NOTE — Telephone Encounter (Signed)
Spoke with patients daughter at length about medications and recommendations. Let her know the typical dosing of these medications and that we do have some savings cards that she could try to use as well. We discussed each one and she stated that we could start on Xarelto. Will forward to Dr. Yvone Neu for dosage amount and will give patients daughter a call back when she can come by to pick up some samples and discount cards.

## 2016-07-25 MED ORDER — RIVAROXABAN 20 MG PO TABS: 20.0000 mg | ORAL_TABLET | Freq: Every day | ORAL | 6 refills | Status: DC

## 2016-07-25 NOTE — Telephone Encounter (Signed)
Spoke with patients daughter [name] and let her know that I sent in prescription and placed some samples up front along with discount cards for her to pick up. She verbalized understanding of our conversation, agreement with plan, and had no further questions at this time.   Medication Samples have been provided to the patient.  Drug name: Xarelto       Strength: 20 mg        Qty: 4 bottles  LOT: [id number]  Exp.Date: 04/20  Dosing instructions: Take one tablet daily with supper meal.  Instructed regarding the correct time, dose, and frequency of taking this medication, including desired effects and most common side effects.   Valora Corporal 10:02 AM 07/25/2016

## 2016-07-25 NOTE — Telephone Encounter (Signed)
xarelto 20mg  po qpm with evening meal

## 2016-07-31 ENCOUNTER — Ambulatory Visit: Payer: Medicare Other | Admitting: Hematology and Oncology

## 2016-07-31 ENCOUNTER — Ambulatory Visit: Payer: Medicare Other

## 2016-07-31 ENCOUNTER — Other Ambulatory Visit: Payer: Medicare Other

## 2016-08-05 ENCOUNTER — Encounter: Payer: Self-pay | Admitting: Cardiology

## 2016-08-06 ENCOUNTER — Other Ambulatory Visit: Payer: Self-pay | Admitting: Hematology and Oncology

## 2016-08-06 NOTE — Progress Notes (Signed)
[facility] day:  08/07/16  Chief Complaint: Sydney Mcdaniel is a 83 y.o. female with chronic lymphocytic leukemia and B12 deficiency who is seen for 3 month assessment.  HPI:  The patient was last seen in the hematology clinic on 04/21/2016.  At that time, she denied any B symptoms.  Exam revealed no adenopathy or hepatosplenomegaly.  CBC on 05/24/2016 revealed a hematocrit of 32.3, hemoglobin 10.7, MCV 91.5, platelets 188,000, and WBC 12,700 .  She received B12.  She has received B12 monthly (last 07/10/2016).  She was diagnosed with atrial fibrillation during the interim.  She presented with several episodes of syncope.  Diagnosis was made with a Holter monitor.  Blood pressure medications were adjusted.  She is on Xarelto.  Patients has had no events in 06/2016.  Symptomatically, she denies any B symptoms.  She denies any adenopathy or infections.  She has had a little more bruising with Xarelto.   Past Medical History:  Diagnosis Date  . Allergy   . Anemia   . Cataract   . Glaucoma   . H/O: hysterectomy    Total  . Hyperlipidemia   . Hypertension   . Hypothyroidism   . Impaired fasting glucose   . Lichen sclerosus   . Osteoporosis    Hips  . Syncope    a. 03/2016 Echo: EF55-60%, no rwma, mild AI/MR, nl PASP; b. 03/2016 48h Holter: no significant arrhythmias/pauses; c. 03/2016 MV: mild apical defect, likely breast attenuation, nl EF, low risk.    Past Surgical History:  Procedure Laterality Date  . APPENDECTOMY    . EYE SURGERY     Glaucoma  . TOTAL ABDOMINAL HYSTERECTOMY      Family History  Problem Relation Age of Onset  . Heart attack Mother   . Glaucoma Mother   . Heart attack Father   . Parkinson's disease Brother   . Heart attack Brother     Social History:  reports that she has never smoked. She has never used smokeless tobacco. She reports that she does not drink alcohol or use drugs.  Her husband died in  Dec 15, 2014.  She lives in [place].  She is accompanied by her daughter, [name].  Allergies:  Allergies  Allergen Reactions  . Alphagan [Brimonidine]   . Pravachol [Pravastatin Sodium]     Current Medications: Current Outpatient Prescriptions  Medication Sig Dispense Refill  . ACCU-CHEK SOFTCLIX LANCETS lancets Use as instructed; check once a day; H40.1131 100 each 12  . amLODipine (NORVASC) 5 MG tablet Take 1 tablet (5 mg total) by mouth daily. 30 tablet 6  . aspirin 81 MG tablet Take 81 mg by mouth daily.    Marland Kitchen atorvastatin (LIPITOR) 20 MG tablet Take 1 tablet (20 mg total) by mouth at bedtime. 90 tablet 3  . Calcium Carb-Cholecalciferol (CALCIUM 600 + D PO) Take by mouth daily.    . cholecalciferol (VITAMIN D) 1000 units tablet Take 1,000 Units by mouth daily.    Marland Kitchen glucose blood (ACCU-CHEK AVIVA PLUS) test strip Use as instructed, check once a day; H40.1131 100 each 3  . latanoprost (XALATAN) 0.005 % ophthalmic solution Place 1 drop into the right eye at bedtime.     Marland Kitchen levothyroxine (SYNTHROID, LEVOTHROID) 75 MCG tablet One by mouth on Mondays, Wednesdays, and Fridays 40 tablet 1  . levothyroxine (SYNTHROID, LEVOTHROID) 88 MCG tablet One by mouth on Tuesdays, Thursdays, Saturdays, and Sundays 52 tablet 1  . lisinopril (  PRINIVIL,ZESTRIL) 5 MG tablet Take 1 tablet (5 mg total) by mouth daily. 90 tablet 3  . Multiple Vitamins-Minerals (MULTIVITAMIN ADULT PO) Take by mouth.    . Omega-3 Fatty Acids (FISH OIL) 1200 MG CAPS Take by mouth daily.    . rivaroxaban (XARELTO) 20 MG TABS tablet Take 1 tablet (20 mg total) by mouth daily with supper. 30 tablet 6  . timolol (TIMOPTIC) 0.5 % ophthalmic solution Place 1 drop into the right eye 2 (two) times daily.     Marland Kitchen ipratropium (ATROVENT) 0.03 % nasal spray Place 2 sprays into both nostrils 3 (three) times daily. (Patient not taking: Reported on 08/07/2016) 90 mL 3   No current facility-administered medications for this visit.     Review of  Systems:  GENERAL:  Getting stronger.  No fevers or sweats.  Weight down 5 pounds. PERFORMANCE STATUS (ECOG):  1 HEENT:  Glaucoma.  No visual changes, runny nose, sore throat, mouth sores or tenderness. Lungs: No shortness of breath or cough.  No hemoptysis. Cardiac:  New diagnosis of atrial fibrillation.  No chest pain, palpitations, orthopnea, or PND.  GI:  No nausea, vomiting, diarrhea, constipation, melena or hematochezia.  Colonoscopy > 10 years ago. GU:  No urgency, frequency, dysuria, or hematuria. Up 2-3 times at night. Musculoskeletal:  No back pain.  No joint pain.  No muscle tenderness. Extremities:  No pain or swelling. Skin:  No rashes or skin changes. Neuro:  No recurrence of syncope.  No headache, numbness or weakness, balance or coordination issues. Endocrine:  No diabetes.  On Synthroid for thyroid disease.  No hot flashes or night sweats. Psych:  No mood changes, depression or anxiety. Pain:  No focal pain. Review of systems:  All other systems reviewed and found to be negative.  Physical Exam: Blood pressure (!) 157/62, pulse 64, temperature 97.2 F (36.2 C), resp. rate 18, weight 126 lb 12.2 oz (57.5 kg). GENERAL:  Well developed, well nourished, woman sitting comfortably in the exam room in no acute distress.  She has a cane at her side. MENTAL STATUS:  Alert and oriented to person, place and time. HEAD:  Curly gray hair.  Normocephalic, atraumatic, face symmetric, no Cushingoid features. EYES:  Glasses.  Blue eyes.  Pupils equal round and reactive to light and accomodation.  No conjunctivitis or scleral icterus. ENT:  Oropharynx clear without lesion.  Tongue normal. Mucous membranes moist.  RESPIRATORY:  Clear to auscultation without rales, wheezes or rhonchi. CARDIOVASCULAR: Regular rate and rhythm without murmur, rub or gallop. ABDOMEN:  Soft, non-tender, with active bowel sounds, and no hepatosplenomegaly.  No masses. SKIN:  No rashes, ulcers or  lesions. EXTREMITIES: No edema, no skin discoloration or tenderness.  No palpable cords. LYMPH NODES: No palpable cervical, supraclavicular, axillary or inguinal adenopathy  NEUROLOGICAL: Unremarkable. PSYCH:  Appropriate.   Clinical Support on 08/07/2016  Component Date Value Ref Range Status  . WBC 08/07/2016 7.3  3.6 - 11.0 K/uL Final  . RBC 08/07/2016 3.74* 3.80 - 5.20 MIL/uL Final  . Hemoglobin 08/07/2016 10.5* 12.0 - 16.0 g/dL Final  . HCT 08/07/2016 30.8* 35.0 - 47.0 % Final  . MCV 08/07/2016 82.4  80.0 - 100.0 fL Final  . MCH 08/07/2016 28.0  26.0 - 34.0 pg Final  . MCHC 08/07/2016 33.9  32.0 - 36.0 g/dL Final  . RDW 08/07/2016 19.1* 11.5 - 14.5 % Final  . Platelets 08/07/2016 226  150 - 440 K/uL Final  . Neutrophils Relative % 08/07/2016 34  %  Final  . Neutro Abs 08/07/2016 2.5  1.4 - 6.5 K/uL Final  . Lymphocytes Relative 08/07/2016 56  % Final  . Lymphs Abs 08/07/2016 4.2* 1.0 - 3.6 K/uL Final  . Monocytes Relative 08/07/2016 7  % Final  . Monocytes Absolute 08/07/2016 0.5  0.2 - 0.9 K/uL Final  . Eosinophils Relative 08/07/2016 3  % Final  . Eosinophils Absolute 08/07/2016 0.2  0 - 0.7 K/uL Final  . Basophils Relative 08/07/2016 0  % Final  . Basophils Absolute 08/07/2016 0.0  0 - 0.1 K/uL Final  . Sodium 08/07/2016 130* 135 - 145 mmol/L Final  . Potassium 08/07/2016 4.6  3.5 - 5.1 mmol/L Final  . Chloride 08/07/2016 98* 101 - 111 mmol/L Final  . CO2 08/07/2016 28  22 - 32 mmol/L Final  . Glucose, Bld 08/07/2016 111* 65 - 99 mg/dL Final  . BUN 08/07/2016 11  6 - 20 mg/dL Final  . Creatinine, Ser 08/07/2016 0.63  0.44 - 1.00 mg/dL Final  . Calcium 08/07/2016 9.1  8.9 - 10.3 mg/dL Final  . GFR calc non Af Amer 08/07/2016 >60  >60 mL/min Final  . GFR calc Af Amer 08/07/2016 >60  >60 mL/min Final   Comment: (NOTE) The eGFR has been calculated using the CKD EPI equation. This calculation has not been validated in all clinical situations. eGFR's persistently <60 mL/min  signify possible Chronic Kidney Disease.   . Anion gap 08/07/2016 4* 5 - 15 Final  . LDH 08/07/2016 121  98 - 192 U/L Final  . Retic Ct Pct 08/07/2016 1.1  0.4 - 3.1 % Final  . RBC. 08/07/2016 3.71* 3.80 - 5.20 MIL/uL Final  . Retic Count, Manual 08/07/2016 40.8  19.0 - 183.0 K/uL Final    Assessment:  Sydney Mcdaniel is a 83 y.o. female with stage 0 CLL.  She presented with a distant history of anemia and recent lymphocytosis.  She was noted to have mild lymphocystosis in 06/22/2015.  Absolute lymphocyte count (ALC) has ranged between 4700 - 5500.  Work-up on 01/10/2016 revealed a hematocrit of 31.6, hemoglobin 11.4, MCV 89, platelets 203,000, WBC 11,00 with an ANC of 3700.  Absolute lymphocyte count was 6400.  Ferritin was 116.  B12 was 119 (low).  Folate was 44.0.  Retic was 0.9% (low).  Uric acid was 4.5.  Guaiac cards were negative x 3 in 12/2015.  She was diagnosed with B12 deficiency.  She receives B12 monthly (last 07/10/2016).  Peripheral blood flow cytometry revealed involvement by a CD5+, CD23+, CD38- monoclonal B cell population with lambda light chain restriction consistent with CLL/SLL.  There was a small population (8% of the T cells) of double positive (CD4+/CD8+) T cells (described in association with chronic viral infections, autoimmune disorders, chronic inflammatory disorders, and immunodeficiency states).   She has a slight progressive normocytic anemia.  Work-up on 08/07/2016 revealed the following normal labs: creatinine, LDH, uric acid, ferritin (105), iron studies(12% sat; TIBC 295), and folate.  Retic was 1.1% (low for level of anemia).  Diet is modest.  She denies any melena or hematochezia.  Guaiac cards were negative x 3 in 12/2015.  Last colonoscopy was > 10 years ago.  She was diagnosed with atrial fibrillation after presenting with syncopal episodes.  She is on Xarelto.  Symptomatically, she denies any B symptoms.  Exam reveals no palpable adenopathy or  hepatosplenomegaly.  Hematocrit is 30.8.  Plan: 1.  Labs today:  CBC with diff, BMP, LDH, uric acid, ferritin,  iron studies, folate, retic. 2.  Guaiac cards x 3. 3.  B12 today. 4.  RTC in 1 month for MD assessment, labs (CBC with diff +/- others) + B12   Lequita Asal, MD  08/07/2016, 9:34 AM

## 2016-08-07 ENCOUNTER — Inpatient Hospital Stay: Payer: Medicare Other | Attending: Hematology and Oncology

## 2016-08-07 ENCOUNTER — Inpatient Hospital Stay: Payer: Medicare Other

## 2016-08-07 ENCOUNTER — Inpatient Hospital Stay (HOSPITAL_BASED_OUTPATIENT_CLINIC_OR_DEPARTMENT_OTHER): Payer: Medicare Other | Admitting: Hematology and Oncology

## 2016-08-07 VITALS — BP 157/62 | HR 64 | Temp 97.2°F | Resp 18 | Wt 126.8 lb

## 2016-08-07 DIAGNOSIS — I10 Essential (primary) hypertension: Secondary | ICD-10-CM | POA: Diagnosis not present

## 2016-08-07 DIAGNOSIS — Z7982 Long term (current) use of aspirin: Secondary | ICD-10-CM | POA: Insufficient documentation

## 2016-08-07 DIAGNOSIS — D649 Anemia, unspecified: Secondary | ICD-10-CM

## 2016-08-07 DIAGNOSIS — Z7901 Long term (current) use of anticoagulants: Secondary | ICD-10-CM | POA: Diagnosis not present

## 2016-08-07 DIAGNOSIS — Z90710 Acquired absence of both cervix and uterus: Secondary | ICD-10-CM | POA: Insufficient documentation

## 2016-08-07 DIAGNOSIS — E538 Deficiency of other specified B group vitamins: Secondary | ICD-10-CM

## 2016-08-07 DIAGNOSIS — I4891 Unspecified atrial fibrillation: Secondary | ICD-10-CM | POA: Insufficient documentation

## 2016-08-07 DIAGNOSIS — C9110 Chronic lymphocytic leukemia of B-cell type not having achieved remission: Secondary | ICD-10-CM | POA: Insufficient documentation

## 2016-08-07 DIAGNOSIS — E785 Hyperlipidemia, unspecified: Secondary | ICD-10-CM | POA: Diagnosis not present

## 2016-08-07 DIAGNOSIS — E039 Hypothyroidism, unspecified: Secondary | ICD-10-CM | POA: Insufficient documentation

## 2016-08-07 LAB — CBC WITH DIFFERENTIAL/PLATELET
Basophils Absolute: 0 10*3/uL (ref 0–0.1)
Basophils Relative: 0 %
Eosinophils Absolute: 0.2 10*3/uL (ref 0–0.7)
Eosinophils Relative: 3 %
HCT: 30.8 % — ABNORMAL LOW (ref 35.0–47.0)
Hemoglobin: 10.5 g/dL — ABNORMAL LOW (ref 12.0–16.0)
Lymphocytes Relative: 56 %
Lymphs Abs: 4.2 10*3/uL — ABNORMAL HIGH (ref 1.0–3.6)
MCH: 28 pg (ref 26.0–34.0)
MCHC: 33.9 g/dL (ref 32.0–36.0)
MCV: 82.4 fL (ref 80.0–100.0)
Monocytes Absolute: 0.5 10*3/uL (ref 0.2–0.9)
Monocytes Relative: 7 %
Neutro Abs: 2.5 10*3/uL (ref 1.4–6.5)
Neutrophils Relative %: 34 %
Platelets: 226 10*3/uL (ref 150–440)
RBC: 3.74 MIL/uL — ABNORMAL LOW (ref 3.80–5.20)
RDW: 19.1 % — ABNORMAL HIGH (ref 11.5–14.5)
WBC: 7.3 10*3/uL (ref 3.6–11.0)

## 2016-08-07 LAB — FOLATE: Folate: 38 ng/mL (ref >5.9)

## 2016-08-07 LAB — LACTATE DEHYDROGENASE: LDH: 121 U/L (ref 98–192)

## 2016-08-07 LAB — BASIC METABOLIC PANEL
Anion gap: 4 — ABNORMAL LOW (ref 5–15)
BUN: 11 mg/dL (ref 6–20)
CO2: 28 mmol/L (ref 22–32)
Calcium: 9.1 mg/dL (ref 8.9–10.3)
Chloride: 98 mmol/L — ABNORMAL LOW (ref 101–111)
Creatinine, Ser: 0.63 mg/dL (ref 0.44–1.00)
GFR calc Af Amer: >60 mL/min (ref >60)
GFR calc non Af Amer: >60 mL/min (ref >60)
Glucose, Bld: 111 mg/dL — ABNORMAL HIGH (ref 65–99)
Potassium: 4.6 mmol/L (ref 3.5–5.1)
Sodium: 130 mmol/L — ABNORMAL LOW (ref 135–145)

## 2016-08-07 LAB — IRON AND TIBC
Iron: 34 ug/dL (ref 28–170)
Saturation Ratios: 12 % (ref 10.4–31.8)
TIBC: 295 ug/dL (ref 250–450)
UIBC: 261 ug/dL

## 2016-08-07 LAB — URIC ACID: Uric Acid, Serum: 3.9 mg/dL (ref 2.3–6.6)

## 2016-08-07 LAB — FERRITIN: Ferritin: 105 ng/mL (ref 11–307)

## 2016-08-07 LAB — RETICULOCYTES
RBC.: 3.71 MIL/uL — ABNORMAL LOW (ref 3.80–5.20)
Retic Count, Absolute: 40.8 10*3/uL (ref 19.0–183.0)
Retic Ct Pct: 1.1 % (ref 0.4–3.1)

## 2016-08-07 MED ORDER — CYANOCOBALAMIN 1000 MCG/ML IJ SOLN
1000.0000 ug | Freq: Once | INTRAMUSCULAR | Status: AC
  Administered 2016-08-07: 1000 ug via INTRAMUSCULAR
  Filled 2016-08-07: qty 1

## 2016-08-07 NOTE — Progress Notes (Signed)
Here w daughter [name] for follow up. Per [name] /dughter pt saw Dr Farrel Conners /cardiologist in January/2018 and med changes made per [facility]. Dx w a fib per daughter.Feels like she is getting stronger and no" passing out episodes "since Dec 23/17

## 2016-08-08 ENCOUNTER — Telehealth: Payer: Self-pay | Admitting: Cardiology

## 2016-08-08 NOTE — Telephone Encounter (Signed)
Spoke with patients daughter per release form and reviewed Dr. Tora Kindred recommendations. Discussed importance of medications and stroke risks and to call if she needs some samples until her assistance forms are processed. Instructed her to continue blood pressure log taking it after her morning medications. She verbalized understanding of our conversation, agreement with plan of care, and had no further questions at this time.

## 2016-08-08 NOTE — Telephone Encounter (Signed)
Patients daughter sent in mychart message with blood pressures listed ranging from 130's over 60's to 150's over 70's. Average blood pressure ranged in the mid 140's over mid 60's. Will have Dr. Yvone Neu review for recommendations.

## 2016-08-08 NOTE — Telephone Encounter (Signed)
Blood pressures have improved. Continue same regimen. Continue blood pressure log once a day now. Emphasize importance of oral anticoagulation for stroke risk reduction. Thank you.

## 2016-08-09 DIAGNOSIS — C9110 Chronic lymphocytic leukemia of B-cell type not having achieved remission: Secondary | ICD-10-CM | POA: Diagnosis not present

## 2016-08-09 DIAGNOSIS — E538 Deficiency of other specified B group vitamins: Secondary | ICD-10-CM | POA: Diagnosis not present

## 2016-08-09 DIAGNOSIS — I10 Essential (primary) hypertension: Secondary | ICD-10-CM | POA: Diagnosis not present

## 2016-08-09 DIAGNOSIS — I4891 Unspecified atrial fibrillation: Secondary | ICD-10-CM | POA: Diagnosis not present

## 2016-08-09 DIAGNOSIS — D649 Anemia, unspecified: Secondary | ICD-10-CM | POA: Diagnosis not present

## 2016-08-09 DIAGNOSIS — E785 Hyperlipidemia, unspecified: Secondary | ICD-10-CM | POA: Diagnosis not present

## 2016-08-11 DIAGNOSIS — E785 Hyperlipidemia, unspecified: Secondary | ICD-10-CM | POA: Diagnosis not present

## 2016-08-11 DIAGNOSIS — D649 Anemia, unspecified: Secondary | ICD-10-CM | POA: Diagnosis not present

## 2016-08-11 DIAGNOSIS — E538 Deficiency of other specified B group vitamins: Secondary | ICD-10-CM | POA: Diagnosis not present

## 2016-08-11 DIAGNOSIS — C9110 Chronic lymphocytic leukemia of B-cell type not having achieved remission: Secondary | ICD-10-CM | POA: Diagnosis not present

## 2016-08-11 DIAGNOSIS — I4891 Unspecified atrial fibrillation: Secondary | ICD-10-CM | POA: Diagnosis not present

## 2016-08-11 DIAGNOSIS — I10 Essential (primary) hypertension: Secondary | ICD-10-CM | POA: Diagnosis not present

## 2016-08-12 DIAGNOSIS — C9110 Chronic lymphocytic leukemia of B-cell type not having achieved remission: Secondary | ICD-10-CM | POA: Diagnosis not present

## 2016-08-12 DIAGNOSIS — D649 Anemia, unspecified: Secondary | ICD-10-CM | POA: Diagnosis not present

## 2016-08-12 DIAGNOSIS — E538 Deficiency of other specified B group vitamins: Secondary | ICD-10-CM | POA: Diagnosis not present

## 2016-08-12 DIAGNOSIS — I10 Essential (primary) hypertension: Secondary | ICD-10-CM | POA: Diagnosis not present

## 2016-08-12 DIAGNOSIS — E785 Hyperlipidemia, unspecified: Secondary | ICD-10-CM | POA: Diagnosis not present

## 2016-08-12 DIAGNOSIS — I4891 Unspecified atrial fibrillation: Secondary | ICD-10-CM | POA: Diagnosis not present

## 2016-08-14 ENCOUNTER — Other Ambulatory Visit: Payer: Self-pay | Admitting: *Deleted

## 2016-08-14 DIAGNOSIS — E538 Deficiency of other specified B group vitamins: Secondary | ICD-10-CM

## 2016-08-14 DIAGNOSIS — C9110 Chronic lymphocytic leukemia of B-cell type not having achieved remission: Secondary | ICD-10-CM

## 2016-08-14 DIAGNOSIS — D649 Anemia, unspecified: Secondary | ICD-10-CM

## 2016-08-14 LAB — OCCULT BLOOD X 1 CARD TO LAB, STOOL
Fecal Occult Bld: NEGATIVE
Fecal Occult Bld: NEGATIVE
Fecal Occult Bld: NEGATIVE

## 2016-08-15 ENCOUNTER — Telehealth: Payer: Self-pay | Admitting: Cardiology

## 2016-08-15 NOTE — Telephone Encounter (Signed)
Patients daughter dropped off [employer] Patient Assistance forms that need to be completed for patients xarelto. Will forward forms to Dr. Yvone Neu for her review and completion.

## 2016-08-15 NOTE — Telephone Encounter (Signed)
Spoke with patients daughter per release form and let her know that we filled out the forms and placed them up front for her to pick up at her convenience. She was appreciative for the call and had no further questions at this time.

## 2016-08-18 ENCOUNTER — Encounter: Payer: Self-pay | Admitting: Cardiology

## 2016-08-29 ENCOUNTER — Encounter: Payer: Self-pay | Admitting: Hematology and Oncology

## 2016-09-04 ENCOUNTER — Inpatient Hospital Stay: Payer: Medicare Other | Attending: Hematology and Oncology

## 2016-09-04 ENCOUNTER — Inpatient Hospital Stay: Payer: Medicare Other

## 2016-09-04 ENCOUNTER — Inpatient Hospital Stay (HOSPITAL_BASED_OUTPATIENT_CLINIC_OR_DEPARTMENT_OTHER): Payer: Medicare Other | Admitting: Hematology and Oncology

## 2016-09-04 ENCOUNTER — Encounter: Payer: Self-pay | Admitting: Hematology and Oncology

## 2016-09-04 VITALS — BP 148/72 | HR 80 | Temp 97.4°F | Resp 18 | Wt 129.2 lb

## 2016-09-04 DIAGNOSIS — E538 Deficiency of other specified B group vitamins: Secondary | ICD-10-CM | POA: Diagnosis not present

## 2016-09-04 DIAGNOSIS — Z7982 Long term (current) use of aspirin: Secondary | ICD-10-CM | POA: Insufficient documentation

## 2016-09-04 DIAGNOSIS — Z79899 Other long term (current) drug therapy: Secondary | ICD-10-CM

## 2016-09-04 DIAGNOSIS — E785 Hyperlipidemia, unspecified: Secondary | ICD-10-CM | POA: Insufficient documentation

## 2016-09-04 DIAGNOSIS — Z90710 Acquired absence of both cervix and uterus: Secondary | ICD-10-CM | POA: Diagnosis not present

## 2016-09-04 DIAGNOSIS — Z7901 Long term (current) use of anticoagulants: Secondary | ICD-10-CM | POA: Diagnosis not present

## 2016-09-04 DIAGNOSIS — I4891 Unspecified atrial fibrillation: Secondary | ICD-10-CM

## 2016-09-04 DIAGNOSIS — D649 Anemia, unspecified: Secondary | ICD-10-CM

## 2016-09-04 DIAGNOSIS — I10 Essential (primary) hypertension: Secondary | ICD-10-CM | POA: Diagnosis not present

## 2016-09-04 DIAGNOSIS — C9110 Chronic lymphocytic leukemia of B-cell type not having achieved remission: Secondary | ICD-10-CM

## 2016-09-04 DIAGNOSIS — E039 Hypothyroidism, unspecified: Secondary | ICD-10-CM | POA: Insufficient documentation

## 2016-09-04 LAB — CBC WITH DIFFERENTIAL/PLATELET
Basophils Absolute: 0 10*3/uL (ref 0–0.1)
Basophils Relative: 0 %
Eosinophils Absolute: 0.2 10*3/uL (ref 0–0.7)
Eosinophils Relative: 2 %
HCT: 30.8 % — ABNORMAL LOW (ref 35.0–47.0)
Hemoglobin: 10.4 g/dL — ABNORMAL LOW (ref 12.0–16.0)
Lymphocytes Relative: 57 %
Lymphs Abs: 4.5 10*3/uL — ABNORMAL HIGH (ref 1.0–3.6)
MCH: 28.7 pg (ref 26.0–34.0)
MCHC: 33.8 g/dL (ref 32.0–36.0)
MCV: 84.8 fL (ref 80.0–100.0)
Monocytes Absolute: 0.5 10*3/uL (ref 0.2–0.9)
Monocytes Relative: 7 %
Neutro Abs: 2.7 10*3/uL (ref 1.4–6.5)
Neutrophils Relative %: 34 %
Platelets: 225 10*3/uL (ref 150–440)
RBC: 3.63 MIL/uL — ABNORMAL LOW (ref 3.80–5.20)
RDW: 21.2 % — ABNORMAL HIGH (ref 11.5–14.5)
WBC: 7.9 10*3/uL (ref 3.6–11.0)

## 2016-09-04 LAB — DAT, POLYSPECIFIC AHG (ARMC ONLY): Polyspecific AHG test: NEGATIVE

## 2016-09-04 MED ORDER — CYANOCOBALAMIN 1000 MCG/ML IJ SOLN
1000.0000 ug | Freq: Once | INTRAMUSCULAR | Status: AC
  Administered 2016-09-04: 1000 ug via INTRAMUSCULAR
  Filled 2016-09-04: qty 1

## 2016-09-04 NOTE — Progress Notes (Signed)
[facility] day:  09/04/16  Chief Complaint: Sydney Mcdaniel is a 83 y.o. female with chronic lymphocytic leukemia, B12 deficiency, and anemia who is seen for 1 month assessment.  HPI:  The patient was last seen in the hematology clinic on 08/07/2016.  At that time, she denied any B symptoms.  Exam revealed no adenopathy or hepatosplenomegaly.  She has had a little more bruising with Xarelto for atrial fibrillation.  CBC revealed a hematocrit of 30.8, hemoglobin 10.5, MCV 82.4, platelets 226,000, and WBC 7,300.  She was noted to have a progressive anemia.  Additional labs were drawn.  Guaiac cards x 3 were negative.  She received B12.  Symptomatically, she feels "ok".  She denies any B symptoms.  She describes a spell last Saturday.  She is walking around the house.   Past Medical History:  Diagnosis Date  . Allergy   . Anemia   . Cataract   . Glaucoma   . H/O: hysterectomy    Total  . Hyperlipidemia   . Hypertension   . Hypothyroidism   . Impaired fasting glucose   . Lichen sclerosus   . Osteoporosis    Hips  . Syncope    a. 03/2016 Echo: EF55-60%, no rwma, mild AI/MR, nl PASP; b. 03/2016 48h Holter: no significant arrhythmias/pauses; c. 03/2016 MV: mild apical defect, likely breast attenuation, nl EF, low risk.    Past Surgical History:  Procedure Laterality Date  . APPENDECTOMY    . EYE SURGERY     Glaucoma  . TOTAL ABDOMINAL HYSTERECTOMY      Family History  Problem Relation Age of Onset  . Heart attack Mother   . Glaucoma Mother   . Heart attack Father   . Parkinson's disease Brother   . Heart attack Brother     Social History:  reports that she has never smoked. She has never used smokeless tobacco. She reports that she does not drink alcohol or use drugs.  Her husband died in 2015-01-14.  She lives in [place].  She is accompanied by her daughter, [name].  Allergies:  Allergies  Allergen Reactions  . Alphagan  [Brimonidine]   . Pravachol [Pravastatin Sodium]     Current Medications: Current Outpatient Prescriptions  Medication Sig Dispense Refill  . ACCU-CHEK SOFTCLIX LANCETS lancets Use as instructed; check once a day; H40.1131 100 each 12  . amLODipine (NORVASC) 5 MG tablet Take 1 tablet (5 mg total) by mouth daily. 30 tablet 6  . aspirin 81 MG tablet Take 81 mg by mouth daily.    Marland Kitchen atorvastatin (LIPITOR) 20 MG tablet Take 1 tablet (20 mg total) by mouth at bedtime. 90 tablet 3  . Calcium Carb-Cholecalciferol (CALCIUM 600 + D PO) Take by mouth daily.    . cholecalciferol (VITAMIN D) 1000 units tablet Take 1,000 Units by mouth daily.    Marland Kitchen glucose blood (ACCU-CHEK AVIVA PLUS) test strip Use as instructed, check once a day; H40.1131 100 each 3  . latanoprost (XALATAN) 0.005 % ophthalmic solution Place 1 drop into the right eye at bedtime.     Marland Kitchen levothyroxine (SYNTHROID, LEVOTHROID) 75 MCG tablet One by mouth on Mondays, Wednesdays, and Fridays 40 tablet 1  . levothyroxine (SYNTHROID, LEVOTHROID) 88 MCG tablet One by mouth on Tuesdays, Thursdays, Saturdays, and Sundays 52 tablet 1  . lisinopril (PRINIVIL,ZESTRIL) 5 MG tablet Take 1 tablet (5 mg total) by mouth daily. 90 tablet 3  . Multiple Vitamins-Minerals (  MULTIVITAMIN ADULT PO) Take by mouth.    . Omega-3 Fatty Acids (FISH OIL) 1200 MG CAPS Take by mouth daily.    . rivaroxaban (XARELTO) 20 MG TABS tablet Take 1 tablet (20 mg total) by mouth daily with supper. 30 tablet 6  . timolol (TIMOPTIC) 0.5 % ophthalmic solution Place 1 drop into the right eye 2 (two) times daily.     Marland Kitchen ipratropium (ATROVENT) 0.03 % nasal spray Place 2 sprays into both nostrils 3 (three) times daily. (Patient not taking: Reported on 08/07/2016) 90 mL 3   No current facility-administered medications for this visit.     Review of Systems:  GENERAL:  Feels "ok".  No fevers or sweats.  Weight up 3 pounds. PERFORMANCE STATUS (ECOG):  1 HEENT:  Glaucoma.  No visual changes,  runny nose, sore throat, mouth sores or tenderness. Lungs: No shortness of breath or cough.  No hemoptysis. Cardiac:  Atrial fibrillation.  No chest pain, palpitations, orthopnea, or PND.  GI:  No nausea, vomiting, diarrhea, constipation, melena or hematochezia.  Colonoscopy > 10 years ago. GU:  No urgency, frequency, dysuria, or hematuria. Up 2-3 times at night.  Drinking Gatorade. Musculoskeletal:  No back pain.  No joint pain.  No muscle tenderness. Extremities:  No pain or swelling. Skin:  No rashes or skin changes. Neuro:  Spell on Saturday.  No headache, numbness or weakness, balance or coordination issues. Endocrine:  No diabetes.  On Synthroid for thyroid disease.  No hot flashes or night sweats. Psych:  No mood changes, depression or anxiety. Pain:  No focal pain. Review of systems:  All other systems reviewed and found to be negative.  Physical Exam: Blood pressure (!) 148/72, pulse 80, temperature 97.4 F (36.3 C), resp. rate 18, weight 129 lb 3 oz (58.6 kg). GENERAL:  Well developed, well nourished, woman sitting comfortably in the exam room in no acute distress.  She has a cane at her side. MENTAL STATUS:  Alert and oriented to person, place and time. HEAD:  Curly gray hair.  Normocephalic, atraumatic, face symmetric, no Cushingoid features. EYES:  Glasses.  Blue eyes.  Pupils equal round and reactive to light and accomodation.  No conjunctivitis or scleral icterus. ENT:  Oropharynx clear without lesion.  Tongue normal. Mucous membranes moist.  RESPIRATORY:  Clear to auscultation without rales, wheezes or rhonchi. CARDIOVASCULAR: Regular rate and rhythm without murmur, rub or gallop. ABDOMEN:  Soft, non-tender, with active bowel sounds, and no hepatosplenomegaly.  No masses. SKIN:  No rashes, ulcers or lesions. EXTREMITIES: No edema, no skin discoloration or tenderness.  No palpable cords. LYMPH NODES: No palpable cervical, supraclavicular, axillary or inguinal adenopathy   NEUROLOGICAL: Unremarkable. PSYCH:  Appropriate.   Appointment on 09/04/2016  Component Date Value Ref Range Status  . WBC 09/04/2016 7.9  3.6 - 11.0 K/uL Final  . RBC 09/04/2016 3.63* 3.80 - 5.20 MIL/uL Final  . Hemoglobin 09/04/2016 10.4* 12.0 - 16.0 g/dL Final  . HCT 09/04/2016 30.8* 35.0 - 47.0 % Final  . MCV 09/04/2016 84.8  80.0 - 100.0 fL Final  . MCH 09/04/2016 28.7  26.0 - 34.0 pg Final  . MCHC 09/04/2016 33.8  32.0 - 36.0 g/dL Final  . RDW 09/04/2016 21.2* 11.5 - 14.5 % Final  . Platelets 09/04/2016 225  150 - 440 K/uL Final  . Neutrophils Relative % 09/04/2016 34  % Final  . Neutro Abs 09/04/2016 2.7  1.4 - 6.5 K/uL Final  . Lymphocytes Relative 09/04/2016 57  %  Final  . Lymphs Abs 09/04/2016 4.5* 1.0 - 3.6 K/uL Final  . Monocytes Relative 09/04/2016 7  % Final  . Monocytes Absolute 09/04/2016 0.5  0.2 - 0.9 K/uL Final  . Eosinophils Relative 09/04/2016 2  % Final  . Eosinophils Absolute 09/04/2016 0.2  0 - 0.7 K/uL Final  . Basophils Relative 09/04/2016 0  % Final  . Basophils Absolute 09/04/2016 0.0  0 - 0.1 K/uL Final    Assessment:  ADEN YOUNGMAN is a 83 y.o. female with stage 0 CLL.  She presented with a distant history of anemia and recent lymphocytosis.  She was noted to have mild lymphocystosis in 06/22/2015.  Absolute lymphocyte count (ALC) has ranged between 4700 - 5500.  Work-up on 01/10/2016 revealed a hematocrit of 31.6, hemoglobin 11.4, MCV 89, platelets 203,000, WBC 11,00 with an ANC of 3700.  Absolute lymphocyte count was 6400.  Ferritin was 116.  B12 was 119 (low).  Folate was 44.0.  Retic was 0.9% (low).  Uric acid was 4.5.  Guaiac cards were negative x 3 in 12/2015.  She was diagnosed with B12 deficiency.  She receives B12 monthly (last 08/07/2016).  Folate was 38 on 08/07/2016.  Peripheral blood flow cytometry revealed involvement by a CD5+, CD23+, CD38- monoclonal B cell population with lambda light chain restriction consistent with CLL/SLL.  There  was a small population (8% of the T cells) of double positive (CD4+/CD8+) T cells (described in association with chronic viral infections, autoimmune disorders, chronic inflammatory disorders, and immunodeficiency states).   She has a slight progressive normocytic anemia.  Work-up on 08/07/2016 revealed the following normal labs: creatinine, LDH, uric acid, ferritin (105), iron studies (12% sat; TIBC 295), and folate.  Retic was 1.1% (low for level of anemia).  Diet is modest.  She denies any melena or hematochezia.  Guaiac cards were negative x 3 in 07/2016.  Last colonoscopy was > 10 years ago.  She was diagnosed with atrial fibrillation after presenting with syncopal episodes.  She is on Xarelto.  Symptomatically, she denies any B symptoms.  Exam reveals no palpable adenopathy or hepatosplenomegaly.  Hematocrit is 30.8.  Plan: 1.  Labs today:  CBC with diff, [place]. 2.  Discuss anemia work-up.  No deficiency documented.  Possibly related to CLL.  No indication for intervention as hemoglobin stable. 3.  B12 today and monthly x 2 for B12. 4.  RTC in 3 months for MD assessment, labs (CBC with diff, BMP, ferritin, sed rate) + B12   Lequita Asal, MD  09/04/2016, 10:25 AM

## 2016-09-04 NOTE — Progress Notes (Signed)
Patient offers no complaints today. 

## 2016-09-12 ENCOUNTER — Ambulatory Visit (INDEPENDENT_AMBULATORY_CARE_PROVIDER_SITE_OTHER): Payer: Medicare Other | Admitting: Cardiology

## 2016-09-12 ENCOUNTER — Encounter: Payer: Self-pay | Admitting: Cardiology

## 2016-09-12 VITALS — BP 158/72 | HR 63 | Ht 62.5 in | Wt 128.0 lb

## 2016-09-12 DIAGNOSIS — E784 Other hyperlipidemia: Secondary | ICD-10-CM | POA: Diagnosis not present

## 2016-09-12 DIAGNOSIS — I5032 Chronic diastolic (congestive) heart failure: Secondary | ICD-10-CM | POA: Diagnosis not present

## 2016-09-12 DIAGNOSIS — E7849 Other hyperlipidemia: Secondary | ICD-10-CM

## 2016-09-12 DIAGNOSIS — I480 Paroxysmal atrial fibrillation: Secondary | ICD-10-CM

## 2016-09-12 DIAGNOSIS — I10 Essential (primary) hypertension: Secondary | ICD-10-CM | POA: Diagnosis not present

## 2016-09-12 NOTE — Progress Notes (Signed)
Cardiology Office Note   Date:  09/12/2016   ID:  Sydney Mcdaniel, DOB 06/05/1925, MRN [id number]  Referring Doctor:  Enid Derry, MD   Cardiologist:   Wende Bushy, MD   Reason for consultation:  Chief Complaint  Patient presents with  . other    2 month follow up. Meds reviewed by the pt. verbally. "doing well."       History of Present Illness: Sydney Mcdaniel is a 83 y.o. female who presents for Follow-up for hypertension, atrial fibrillation   Review medical records show: October 2017: Episode occurred some time a week ago. Patient was at home. The daughter just left around 2:30 or 3:30 in the afternoon to go back to her own house. Patient then walked outside to go to the mailbox. She then decided to do some cleaning in the yard picking up some twigs she probably spent around 10-15 minutes in the yard. It was a very hot day. She felt like she needed to go back to the house and drink something cold. She got to her kitchen. She opened up a bottle of likely [facility], poured into her glass. She remembers putting back the lid on the bottle. And after that, the next thing she remembers was that she was on the ground. She fell down to her sideways, and came to when she heard the alarm of her monitor going off. She was unable to get up and she will felt weak. Based on the timing and the recollection from her and from her daughter, she was likely out for less than a minute or so. She denies getting  chest pain or shortness of breath or palpitations before the episode. All she could remember was she felt she needed to drink something as she will it was very hot outside.  She then started checking her blood pressure after that event. She noticed that the top number was running relatively low in the 100s and 100 teens. Previously was in the 120s and 130s. That was something new for her. she eventually followed up for her PCP and the thinking was the episode may have been related to relative  hypotension, especially on a very hot day. Medications were adjusted. Amlodipine was stopped. Lisinopril was reduced to half her usual dose.  Stress testing was nonischemic. Echo showed normal LV function. 48 hour Holter did not reveal any significant arrhythmias or pauses. There was some concern that maybe low blood pressure was playing a role in her medications were adjusted by her primary care provider. In late November 2017, she had recurrent near syncope that occurred after awaking and feeling poorly with chest discomfort. EMS came and felt she was stable however one she stood up, she became diaphoretic and lightheaded. She was taken to [facility] and subsequently admitted. Cardiac markers were negative. She was seen by our team and noted to have a 4.2 second pause during sleep and was evaluated by electrophysiology. 30 day event monitor was recommended. Blood pressures fluctuated quite a bit and she was discharged off of all antihypertensives. She had previously been taking lisinopril 10 mg and amlodipine 5 mg daily.  She was seen by NP on 12/19 c/o fatigue, rising BPs @ home, DOE, and cough.  Wt was up 8 lbs above baseline.  She had very diminished breath sounds in the left base.  CXR was performed and showed L>R pleural effusions.  PNA could not be ruled out and radiology rec f/u CXR after abx  course. Started low dose lasix for suspicion that diast failure in the setting of elevated BPs while off of previous antihypertensive Rx . Amlodipine 5 g also started. On the cecum visit 06/16/2016, patient was feeling much better and responded to the medication. Repeat x-ray was planned prior to next follow-up.  On 06/17/2016, patient had another episode of passing out at home. Noted at breakfast table, shortly after waking up that day. No complaints of chest pain or shortness of breath. She was sitting at the table when it happened. She was brought to the floor by family when she failed to respond  appropriately and sluggish to respond. Episode lasted only a few seconds. Blood pressure was checked around that time and was noted to be relatively much lower, with systolics in the 277O. At that point, called to cardiology was made to. Covering nurse practitioner instructed him to reduce the Lasix to every other day, and to take amlodipine only when blood pressure systolic is more than 242.  Since last visit, patient has been doing well. No passing out. No PND. No orthopnea. No edema. She has been off of the Lasix. Review of her blood pressure log shows that her systolic has been greater than 353I even 144R systolic. She usually takes amlodipine 7 or 7:30 in the morning. She states the lisinopril around 1 PM, an hour after her lunch.  ROS:  Please see the history of present illness. Aside from mentioned under HPI, all other systems are reviewed and negative.    Past Medical History:  Diagnosis Date  . Allergy   . Anemia   . Cataract   . Glaucoma   . H/O: hysterectomy    Total  . Hyperlipidemia   . Hypertension   . Hypothyroidism   . Impaired fasting glucose   . Lichen sclerosus   . Osteoporosis    Hips  . Syncope    a. 03/2016 Echo: EF55-60%, no rwma, mild AI/MR, nl PASP; b. 03/2016 48h Holter: no significant arrhythmias/pauses; c. 03/2016 MV: mild apical defect, likely breast attenuation, nl EF, low risk.    Past Surgical History:  Procedure Laterality Date  . APPENDECTOMY    . EYE SURGERY     Glaucoma  . TOTAL ABDOMINAL HYSTERECTOMY       reports that she has never smoked. She has never used smokeless tobacco. She reports that she does not drink alcohol or use drugs.   family history includes Glaucoma in her mother; Heart attack in her brother, father, and mother; Parkinson's disease in her brother.   Outpatient Medications Prior to Visit  Medication Sig Dispense Refill  . ACCU-CHEK SOFTCLIX LANCETS lancets Use as instructed; check once a day; H40.1131 100 each 12  .  amLODipine (NORVASC) 5 MG tablet Take 1 tablet (5 mg total) by mouth daily. 30 tablet 6  . aspirin 81 MG tablet Take 81 mg by mouth daily.    Marland Kitchen atorvastatin (LIPITOR) 20 MG tablet Take 1 tablet (20 mg total) by mouth at bedtime. 90 tablet 3  . Calcium Carb-Cholecalciferol (CALCIUM 600 + D PO) Take by mouth daily.    . cholecalciferol (VITAMIN D) 1000 units tablet Take 1,000 Units by mouth daily.    Marland Kitchen glucose blood (ACCU-CHEK AVIVA PLUS) test strip Use as instructed, check once a day; H40.1131 100 each 3  . ipratropium (ATROVENT) 0.03 % nasal spray Place 2 sprays into both nostrils 3 (three) times daily. 90 mL 3  . latanoprost (XALATAN) 0.005 % ophthalmic solution  Place 1 drop into the right eye at bedtime.     Marland Kitchen levothyroxine (SYNTHROID, LEVOTHROID) 75 MCG tablet One by mouth on Mondays, Wednesdays, and Fridays 40 tablet 1  . levothyroxine (SYNTHROID, LEVOTHROID) 88 MCG tablet One by mouth on Tuesdays, Thursdays, Saturdays, and Sundays 52 tablet 1  . lisinopril (PRINIVIL,ZESTRIL) 5 MG tablet Take 1 tablet (5 mg total) by mouth daily. 90 tablet 3  . Multiple Vitamins-Minerals (MULTIVITAMIN ADULT PO) Take by mouth.    . Omega-3 Fatty Acids (FISH OIL) 1200 MG CAPS Take by mouth daily.    . rivaroxaban (XARELTO) 20 MG TABS tablet Take 1 tablet (20 mg total) by mouth daily with supper. 30 tablet 6  . timolol (TIMOPTIC) 0.5 % ophthalmic solution Place 1 drop into the right eye 2 (two) times daily.      No facility-administered medications prior to visit.      Allergies: Alphagan [brimonidine] and Pravachol [pravastatin sodium]    PHYSICAL EXAM: VS:  BP (!) 158/72 (BP Location: Left Arm, Patient Position: Sitting, Cuff Size: Normal)   Pulse 63   Ht 5' 2.5" (1.588 m)   Wt 128 lb (58.1 kg)   LMP  (LMP Unknown)   BMI 23.04 kg/m  , Body mass index is 23.04 kg/m. Wt Readings from Last 3 Encounters:  09/12/16 128 lb (58.1 kg)  09/04/16 129 lb 3 oz (58.6 kg)  08/07/16 126 lb 12.2 oz (57.5 kg)      GENERAL:  well developed, well nourished, obese, not in acute distress HEENT: normocephalic, pink conjunctivae, anicteric sclerae, no xanthelasma, normal dentition, oropharynx clear NECK:  no neck vein engorgement, JVP normal, no hepatojugular reflux, carotid upstroke brisk and symmetric, no bruit, no thyromegaly, no lymphadenopathy LUNGS:  good respiratory effort, clear to auscultation bilaterally CV:  PMI not displaced, no thrills, no lifts, S1 and S2 within normal limits, no palpable S3 or S4, no murmurs, no rubs, no gallops ABD:  Soft, nontender, nondistended, normoactive bowel sounds, no abdominal aortic bruit, no hepatomegaly, no splenomegaly MS: nontender back, no kyphosis, no scoliosis, no joint deformities EXT:  2+ DP/PT pulses, no edema, no varicosities, no cyanosis, no clubbing SKIN: warm, nondiaphoretic, normal turgor, no ulcers NEUROPSYCH: alert, oriented to person, place, and time, sensory/motor grossly intact, normal mood, appropriate affect   Recent Labs: 12/21/2015: Magnesium 2.2 04/21/2016: ALT 18 06/27/2016: TSH 2.158 08/07/2016: BUN 11; Creatinine, Ser 0.63; Potassium 4.6; Sodium 130 09/04/2016: Hemoglobin 10.4; Platelets 225   Lipid Panel    Component Value Date/Time   CHOL 128 12/21/2015 0950   CHOL 141 08/24/2015 0856   TRIG 111 12/21/2015 0950   HDL 62 12/21/2015 0950   HDL 52 08/24/2015 0856   CHOLHDL 2.1 12/21/2015 0950   VLDL 22 12/21/2015 0950   LDLCALC 44 12/21/2015 0950   LDLCALC 64 08/24/2015 0856     Other studies Reviewed:  EKG:  The ekg from 03/29/2016 was personally reviewed by me and it revealed sinus rhythm, 71 BPM. First-degree AV block. Incomplete left bundle-branch block.  Additional studies/ records that were reviewed personally reviewed by me today include:   Echo 04/17/2016: Left ventricle: The cavity size was normal. Systolic function was   normal. The estimated ejection fraction was in the range of 55%   to 60%. Wall motion was  normal; there were no regional wall   motion abnormalities. Left ventricular diastolic function   parameters were normal. - Aortic valve: There was mild regurgitation. - Mitral valve: There was mild regurgitation. -  Left atrium: The atrium was normal in size. - Right ventricle: Systolic function was normal. - Pulmonary arteries: Systolic pressure was within the normal   range.  Holter 04/26/2016: Overall rhythm was sinus. The heart rate ranged from 44-85 bpm, average of 60 BPM. Note of first-degree AV block. Longest RR interval was within normal limits at 1.44 seconds.  Supraventricular ectopy: 274 isolated PACs, 48 atrial couplets. 2 short atrial runs - 4 beats at 87 BPM, 4 beats at 95 BPM.  Ventricular ectopy: 28 isolated PVCs.  No evidence of atrial fibrillation.  Nuclear stress is 04/10/2016:  There was no ST segment deviation noted during stress.  Defect 1: There is a small defect of mild severity present in the apex location. This is likely due to breast attenuation.  The study is normal.  This is a low risk study.  The left ventricular ejection fraction is normal (55-65%).  Event monitor 07/20/2016: Monitoring period was 06/15/2016 to 07/14/2016.  Evidence of atrial fibrillation, 4% of the total duration. Heart rate ranged from 59-118 bpm, average of 81 BPM.   Overall rhythm was sinus with first-degree AV block and bundle branch block. Cardiac ranged from 53-190 BPM, average 72 BPM.  Manually detected events most to correspond with the baseline rhythm of sinus with first-degree AV block.  No significant pauses were detected.   ASSESSMENT AND PLAN:  Episode of passing out  History of slow heart rate Abnormal EKG with first-degree AV block and incomplete left bundle branch block Recommendations the same as per last visit 07/11/2016:  normal LV function on echo, no ischemia on Myoview, and no evidence of significant pauses or arrhythmia on monitoring.  She  was recently admitted to [facility] with presyncope that occurred after standing at home. This was felt to be most likely secondary to orthostasis. During admission, she was noted to have a 4.1 second pause during sleep. She was seen by Dr. Caryl Comes from our team with recommendation for 30 day event monitor.  Most recent episode 12/23 also most likely consistent with orthostasis.  Since last visit, she has had no recurrence. Continue blood pressure monitoring, daily weights, compression stockings. Continue to increase hydration with water plus minus low calorie Gatorade.  Paroxysmal atrial fibrillation with ventricular rates control. Recommend DOAC for CHADS2-VASc= at least 4 Continue Xarelto 20mg  po qd. If unable to afford, we will refer to Coumadin clinic. INR goal 2-3.   Hypertension Blood pressure is still running more than 962 systolic. Even 836O to 294T systolic. Recommend to continue amlodipine 5 mg by mouth daily in the morning around 7 or 7:30 AM. Recommend to take a simple earlier in the day 5 mg by mouth daily, roughly 10 or 11 AM. Continue blood pressure monitoring. Continue current medical therapy and lifestyle changes.   Episode of shortness of breath and weight gain, likely related to acute on chronic diastolic congestive heart failure Pleural effusion, improved on repeat chest x-ray No evidence of CHF on today's visit. Continue daily weights. Do not see any need for Lasix as of this point.   Hyperlipidemia PCP following labs.   Current medicines are reviewed at length with the patient today.  The patient does not have concerns regarding medicines.  Labs/ tests ordered today include:  No orders of the defined types were placed in this encounter.   I had a lengthy and detailed discussion with the patient regarding diagnoses, prognosis, diagnostic options, treatment options , and side effects of medications.   I counseled  the patient on importance of lifestyle  modification including heart healthy diet, regular physical activity once cardiac workup is completed   Disposition:   FU with cardiology in 6 months time   Signed, Wende Bushy, MD  09/12/2016 11:02 AM    [place]  This note was generated in part with voice recognition software and I apologize for any typographical errors that were not detected and corrected.

## 2016-09-12 NOTE — Patient Instructions (Addendum)
Follow-Up: Your physician wants you to follow-up in: 6 months. You will receive a reminder letter in the mail two months in advance. If you don't receive a letter, please call our office to schedule the follow-up appointment.  It was a pleasure seeing you today here in the office. Please do not hesitate to give [place] a call back if you have any further questions. [place], BSN     Medication Samples have been provided to the patient.  Drug name: Xarelto       Strength: 20 mg        Qty: 4 bottles  LOT: [id number] Exp.Date: 04/20  Dosing instructions: Take one tablet daily with supper

## 2016-09-30 ENCOUNTER — Other Ambulatory Visit: Payer: Self-pay | Admitting: Family Medicine

## 2016-09-30 DIAGNOSIS — E039 Hypothyroidism, unspecified: Secondary | ICD-10-CM

## 2016-10-02 ENCOUNTER — Emergency Department: Payer: Medicare Other

## 2016-10-02 ENCOUNTER — Telehealth: Payer: Self-pay | Admitting: Cardiology

## 2016-10-02 ENCOUNTER — Emergency Department
Admission: EM | Admit: 2016-10-02 | Discharge: 2016-10-02 | Disposition: A | Discharge status: Home / Self Care | Payer: Medicare Other | Attending: Student in an Organized Health Care Education/Training Program | Admitting: Student in an Organized Health Care Education/Training Program

## 2016-10-02 ENCOUNTER — Other Ambulatory Visit: Payer: Self-pay

## 2016-10-02 ENCOUNTER — Encounter: Payer: Self-pay | Admitting: Emergency Medicine

## 2016-10-02 ENCOUNTER — Inpatient Hospital Stay: Payer: Medicare Other | Attending: Hematology and Oncology

## 2016-10-02 ENCOUNTER — Ambulatory Visit: Payer: Medicare Other

## 2016-10-02 DIAGNOSIS — R55 Syncope and collapse: Secondary | ICD-10-CM | POA: Insufficient documentation

## 2016-10-02 DIAGNOSIS — I5032 Chronic diastolic (congestive) heart failure: Secondary | ICD-10-CM | POA: Insufficient documentation

## 2016-10-02 DIAGNOSIS — Z7982 Long term (current) use of aspirin: Secondary | ICD-10-CM | POA: Diagnosis not present

## 2016-10-02 DIAGNOSIS — Z79899 Other long term (current) drug therapy: Secondary | ICD-10-CM | POA: Insufficient documentation

## 2016-10-02 DIAGNOSIS — I110 Hypertensive heart disease with heart failure: Secondary | ICD-10-CM | POA: Diagnosis not present

## 2016-10-02 DIAGNOSIS — E538 Deficiency of other specified B group vitamins: Secondary | ICD-10-CM

## 2016-10-02 DIAGNOSIS — C9110 Chronic lymphocytic leukemia of B-cell type not having achieved remission: Secondary | ICD-10-CM | POA: Diagnosis not present

## 2016-10-02 DIAGNOSIS — E039 Hypothyroidism, unspecified: Secondary | ICD-10-CM | POA: Diagnosis not present

## 2016-10-02 DIAGNOSIS — D649 Anemia, unspecified: Secondary | ICD-10-CM | POA: Diagnosis not present

## 2016-10-02 LAB — URINALYSIS, COMPLETE (UACMP) WITH MICROSCOPIC
BILIRUBIN URINE: NEGATIVE
Bacteria, UA: NONE SEEN
GLUCOSE, UA: NEGATIVE mg/dL
HGB URINE DIPSTICK: NEGATIVE
KETONES UR: NEGATIVE mg/dL
NITRITE: NEGATIVE
PH: 7 (ref 5.0–8.0)
PROTEIN: NEGATIVE mg/dL
Specific Gravity, Urine: 1.005 (ref 1.005–1.030)

## 2016-10-02 LAB — CBC
HCT: 32.5 % — ABNORMAL LOW (ref 35.0–47.0)
Hemoglobin: 10.8 g/dL — ABNORMAL LOW (ref 12.0–16.0)
MCH: 28.7 pg (ref 26.0–34.0)
MCHC: 33.4 g/dL (ref 32.0–36.0)
MCV: 85.9 fL (ref 80.0–100.0)
PLATELETS: 218 10*3/uL (ref 150–440)
RBC: 3.78 MIL/uL — AB (ref 3.80–5.20)
RDW: 20.1 % — AB (ref 11.5–14.5)
WBC: 10.7 10*3/uL (ref 3.6–11.0)

## 2016-10-02 LAB — BASIC METABOLIC PANEL
Anion gap: 7 (ref 5–15)
BUN: 18 mg/dL (ref 6–20)
CHLORIDE: 97 mmol/L — AB (ref 101–111)
CO2: 25 mmol/L (ref 22–32)
CREATININE: 0.7 mg/dL (ref 0.44–1.00)
Calcium: 9.2 mg/dL (ref 8.9–10.3)
GFR calc Af Amer: >60 mL/min (ref >60)
Glucose, Bld: 113 mg/dL — ABNORMAL HIGH (ref 65–99)
Potassium: 4.3 mmol/L (ref 3.5–5.1)
SODIUM: 129 mmol/L — AB (ref 135–145)

## 2016-10-02 MED ORDER — CYANOCOBALAMIN 1000 MCG/ML IJ SOLN
1000.0000 ug | Freq: Once | INTRAMUSCULAR | Status: AC
  Administered 2016-10-02: 1000 ug via INTRAMUSCULAR
  Filled 2016-10-02: qty 1

## 2016-10-02 NOTE — Telephone Encounter (Signed)
Pt daughter is on the phone states pt just passed out. States she is fine now. States she is still on the floor now. States her BP laying down was 117/67, and sitting up 147/63. That was about 10 minutes ago.

## 2016-10-02 NOTE — ED Triage Notes (Signed)
Pt in via POV; pt family reports receiving a call from life alert alarm today at 1515.  Pt found on the floor in the kitchen, pt does not recall the fall, pt unable to recall if she hit or head.  Pt takes xarelto.  Pt A/Ox4, denies headache, denies changes in vision.  Pt without any neurological deficits at this time.  NAD noted at this time.

## 2016-10-02 NOTE — Telephone Encounter (Signed)
Lab Results  Component Value Date   TSH 2.158 06/27/2016   Normal, Rxs approved

## 2016-10-02 NOTE — Telephone Encounter (Signed)
S/w pt's daughter, [name], who reports pt passed out today. Pt felt nauseated, walked to the kitchen for something to drink, passed out, and fell on the floor.  Daughter is unsure if she hit her head. Pt does not complain of any pain, no bleeding, alert and oriented. BP laying: 117/67 Sitting: 147/63.  Pt takes xarelto 20mg  and aspirin 81mg .  Advised daughter to have pt evaluated in the ER.  Daughter agreeable with plan and will either call EMS or transport pt to [facility].

## 2016-10-02 NOTE — ED Provider Notes (Signed)
[facility] Emergency Department Provider Note    First MD Initiated Contact with Patient 10/02/16 2014     (approximate)  I have reviewed the triage vital signs and the nursing notes.   HISTORY  Chief Complaint Loss of Consciousness    HPI Sydney Mcdaniel is a 83 y.o. female with a history of fainting spells presents after having a syncopal event at home in her kitchen today. Patient states that she was otherwise feeling well. She was sitting in her living room and started feeling nauseated and lightheaded so she got up to walk to the refrigerator to get something to drink. She remembers (for greater and grabbing and drink and then woke up on the ground with a life alert alarming. She called her daughter to be checked out. Did not bite her tongue. He does not remember she had her head. On arrival to the ER she feels well. States that she's had multiple episodes of this in the past. Had recent Holter monitor that showed that she had paroxysmal A. fib and she was recently placed on Xarelto for this. Had an echocardiogram within the past 6 months that showed normal EF and normal wall motion.    She denies any nausea or vomiting. No abdominal pain. No dark stools. No diarrhea.   Past Medical History:  Diagnosis Date  . Allergy   . Anemia   . Cataract   . Glaucoma   . H/O: hysterectomy    Total  . Hyperlipidemia   . Hypertension   . Hypothyroidism   . Impaired fasting glucose   . Lichen sclerosus   . Osteoporosis    Hips  . Syncope    a. 03/2016 Echo: EF55-60%, no rwma, mild AI/MR, nl PASP; b. 03/2016 48h Holter: no significant arrhythmias/pauses; c. 03/2016 MV: mild apical defect, likely breast attenuation, nl EF, low risk.   Family History  Problem Relation Age of Onset  . Heart attack Mother   . Glaucoma Mother   . Heart attack Father   . Parkinson's disease Brother   . Heart attack Brother    Past Surgical History:  Procedure Laterality Date    . APPENDECTOMY    . EYE SURGERY     Glaucoma  . TOTAL ABDOMINAL HYSTERECTOMY     Patient Active Problem List   Diagnosis Date Noted  . Chronic diastolic congestive heart failure ([place]) 06/28/2016  . Osteoporosis screening 06/22/2016  . Breast cancer screening 06/22/2016  . Syncope, near 05/24/2016  . Chest pain 05/24/2016  . Primary open-angle glaucoma, bilateral, mild stage 05/08/2016  . Labile blood glucose 04/27/2016  . Syncope 03/22/2016  . Bladder prolapse, female, acquired 02/25/2016  . Urinary tract infection 02/25/2016  . 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  . B12 deficiency 01/11/2016  . Anemia 01/10/2016  . CLL (chronic lymphocytic leukemia) ([place]) 01/10/2016  . Hyperkalemia 12/22/2015  . 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 12/21/2015  . Bradycardia 12/21/2015  . Lymphocytosis 06/25/2015  . Preventative health care 06/22/2015  . Impaired fasting glucose 06/22/2015  . Medication monitoring encounter 06/22/2015  . Nocturia 06/22/2015  . Carotid atherosclerosis 06/22/2015  . Essential hypertension, benign 12/22/2014  . Hypercholesteremia 12/22/2014  . Hypothyroidism 12/22/2014      Prior to Admission medications   Medication Sig Start Date End Date Taking? Authorizing Provider  ACCU-CHEK SOFTCLIX LANCETS lancets Use as instructed; check once a day; H40.1131 05/08/16  Arnetha Courser, MD  amLODipine (NORVASC) 5 MG tablet Take 1 tablet (5 mg total) by mouth daily. 06/13/16 09/12/16  Rogelia Mire, NP  aspirin 81 MG tablet Take 81 mg by mouth daily.    Historical Provider, MD  atorvastatin (LIPITOR) 20 MG tablet Take 1 tablet (20 mg total) by mouth at bedtime. 04/12/16   Arnetha Courser, MD  Calcium Carb-Cholecalciferol (CALCIUM 600 + D PO) Take by mouth daily.    Historical Provider, MD  cholecalciferol (VITAMIN D) 1000 units tablet Take 1,000 Units by mouth daily.    Historical Provider, MD  glucose  blood (ACCU-CHEK AVIVA PLUS) test strip Use as instructed, check once a day; H40.1131 05/08/16   Arnetha Courser, MD  ipratropium (ATROVENT) 0.03 % nasal spray Place 2 sprays into both nostrils 3 (three) times daily. 12/21/15   Arnetha Courser, MD  latanoprost (XALATAN) 0.005 % ophthalmic solution Place 1 drop into the right eye at bedtime.  04/24/15   Historical Provider, MD  levothyroxine (SYNTHROID, LEVOTHROID) 75 MCG tablet TAKE ONE TABLET BY MOUTH ON MONDAY, WEDNESDAY AND FRIDAY. 10/02/16   Arnetha Courser, MD  levothyroxine (SYNTHROID, LEVOTHROID) 88 MCG tablet TAKE ONE TABLET BY MOUTH ON TUESDAY, [name], SATURDAY, AND SUNDAY 10/02/16   Arnetha Courser, MD  lisinopril (PRINIVIL,ZESTRIL) 5 MG tablet Take 1 tablet (5 mg total) by mouth daily. 07/11/16 10/09/16  Wende Bushy, MD  Multiple Vitamins-Minerals (MULTIVITAMIN ADULT PO) Take by mouth.    Historical Provider, MD  Omega-3 Fatty Acids (FISH OIL) 1200 MG CAPS Take by mouth daily.    Historical Provider, MD  rivaroxaban (XARELTO) 20 MG TABS tablet Take 1 tablet (20 mg total) by mouth daily with supper. 07/25/16   Wende Bushy, MD  timolol (TIMOPTIC) 0.5 % ophthalmic solution Place 1 drop into the right eye 2 (two) times daily.  04/24/15   Historical Provider, MD    Allergies Alphagan [brimonidine] and Pravachol [pravastatin sodium]    Social History Social History  Substance Use Topics  . Smoking status: Never Smoker  . Smokeless tobacco: Never Used  . Alcohol use No    Review of Systems Patient denies headaches, rhinorrhea, blurry vision, numbness, shortness of breath, chest pain, edema, cough, abdominal pain, nausea, vomiting, diarrhea, dysuria, fevers, rashes or hallucinations unless otherwise stated above in HPI. ____________________________________________   PHYSICAL EXAM:  VITAL SIGNS: Vitals:   10/02/16 1722  BP: (!) 147/63  Pulse: 72  Resp: 18  Temp: 98.4 F (36.9 C)    Constitutional: Alert and oriented. Well  appearing and in no acute distress. Eyes: Conjunctivae are normal. PERRL. EOMI. Head: Atraumatic. Nose: No congestion/rhinnorhea. Mouth/Throat: Mucous membranes are moist.  Oropharynx non-erythematous. Neck: No stridor. Painless ROM. No cervical spine tenderness to palpation Hematological/Lymphatic/Immunilogical: No cervical lymphadenopathy. Cardiovascular: Normal rate, regular rhythm. Grossly normal heart sounds.  Good peripheral circulation. Respiratory: Normal respiratory effort.  No retractions. Lungs CTAB. Gastrointestinal: Soft and nontender. No distention. No abdominal bruits. No CVA tenderness. Musculoskeletal: No lower extremity tenderness nor edema.  No joint effusions. Neurologic:  CN- intact.  No facial droop, Normal FNF.  Normal heel to shin.  Sensation intact bilaterally. Normal speech and language. No gross focal neurologic deficits are appreciated. No gait instability. Skin:  Skin is warm, dry and intact. No rash noted. Psychiatric: Mood and affect are normal. Speech and behavior are normal.  ____________________________________________   LABS (all labs ordered are listed, but only abnormal results are displayed)  Results for orders placed  or performed during the hospital encounter of 10/02/16 (from the past 24 hour(s))  Basic metabolic panel     Status: Abnormal   Collection Time: 10/02/16  5:27 PM  Result Value Ref Range   Sodium 129 (L) 135 - 145 mmol/L   Potassium 4.3 3.5 - 5.1 mmol/L   Chloride 97 (L) 101 - 111 mmol/L   CO2 25 22 - 32 mmol/L   Glucose, Bld 113 (H) 65 - 99 mg/dL   BUN 18 6 - 20 mg/dL   Creatinine, Ser 0.70 0.44 - 1.00 mg/dL   Calcium 9.2 8.9 - 10.3 mg/dL   GFR calc non Af Amer >60 >60 mL/min   GFR calc Af Amer >60 >60 mL/min   Anion gap 7 5 - 15  CBC     Status: Abnormal   Collection Time: 10/02/16  5:27 PM  Result Value Ref Range   WBC 10.7 3.6 - 11.0 K/uL   RBC 3.78 (L) 3.80 - 5.20 MIL/uL   Hemoglobin 10.8 (L) 12.0 - 16.0 g/dL   HCT 32.5  (L) 35.0 - 47.0 %   MCV 85.9 80.0 - 100.0 fL   MCH 28.7 26.0 - 34.0 pg   MCHC 33.4 32.0 - 36.0 g/dL   RDW 20.1 (H) 11.5 - 14.5 %   Platelets 218 150 - 440 K/uL  Urinalysis, Complete w Microscopic     Status: Abnormal   Collection Time: 10/02/16  5:27 PM  Result Value Ref Range   Color, Urine YELLOW (A) YELLOW   APPearance HAZY (A) CLEAR   Specific Gravity, Urine 1.005 1.005 - 1.030   pH 7.0 5.0 - 8.0   Glucose, UA NEGATIVE NEGATIVE mg/dL   Hgb urine dipstick NEGATIVE NEGATIVE   Bilirubin Urine NEGATIVE NEGATIVE   Ketones, ur NEGATIVE NEGATIVE mg/dL   Protein, ur NEGATIVE NEGATIVE mg/dL   Nitrite NEGATIVE NEGATIVE   Leukocytes, UA SMALL (A) NEGATIVE   RBC / HPF 0-5 0 - 5 RBC/hpf   WBC, UA 0-5 0 - 5 WBC/hpf   Bacteria, UA NONE SEEN NONE SEEN   Squamous Epithelial / LPF 0-5 (A) NONE SEEN   ____________________________________________  EKG My review and personal interpretation at Time: 17:24   Indication: syncope  Rate: 70  Rhythm: sinus Axis: normal Other: poor R wave progress, normal intervals ____________________________________________  RADIOLOGY  I personally reviewed all radiographic images ordered to evaluate for the above acute complaints and reviewed radiology reports and findings.  These findings were personally discussed with the patient.  Please see medical record for radiology report.  ____________________________________________   PROCEDURES  Procedure(s) performed:  Procedures    Critical Care performed: no ____________________________________________   INITIAL IMPRESSION / ASSESSMENT AND PLAN / ED COURSE  Pertinent labs & imaging results that were available during my care of the patient were reviewed by me and considered in my medical decision making (see chart for details).  DDX: vasovagal, dysrhythmia, dehydration, tia, seizure  JOLISSA KAPRAL is a 83 y.o. who presents to the presenting after syncopal episode. Patient was hemodynamically stable  upon arrival. The patient was immediately placed on a cardiac monitor. Patient found to have normal cardiac and neurologic exam.  Lack of palpitations or chest pain preceding the event as well as normal ECG with no evidence of ischemic changes, QT prolongation or delta wave make dysrhythmia causes less likely. Had recent cardiac workup that was unremarkable.  Very low suspicion for PE given history of event and lack of SOB, hypoxia, tachycardia  or significant risk factors. Acute blood loss 2/2 acute GI bleed unlikely as cause given hemoccult negative stools. Hgb was stable compared to previous values. Orthostatic causes are possible. Syncope may have been due to hypovolemia from dehydration, given patient has  been drinking plenty of fluids, is not dizzy when rising to stand abruptly. Patient was not orthostatic in ED. Syncope does not appear to be due to seizure, as no witnessed seizure activity and no post-ictal period after regaining consciousness. No ongoing neurologic deficits to suggest cerebrovascular accident. No ongoing headache to raise concern for [employer] Vasovagal syncope possible, as patient did  endorse feeling flushed, weak, and dizzy   Clinical Course as of Oct 02 2348  Mon Oct 02, 2016  2133 Patient remains in no acute distress. No orthostasis. Requesting discharge home.  Do feel this is appropriate given her extensive outpatient workup and previous episodes of similar.  Have discussed with the patient and available family all diagnostics and treatments performed thus far and all questions were answered to the best of my ability. The patient demonstrates understanding and agreement with plan.   [PR]    Clinical Course User Index [PR] Merlyn Lot, MD     ____________________________________________   FINAL CLINICAL IMPRESSION(S) / ED DIAGNOSES  Final diagnoses:  Fainting spell      NEW MEDICATIONS STARTED DURING THIS VISIT:  New Prescriptions   No medications on file      Note:  This document was prepared using Dragon voice recognition software and may include unintentional dictation errors.    Merlyn Lot, MD 10/02/16 [phone]

## 2016-10-04 ENCOUNTER — Encounter: Payer: Self-pay | Admitting: Family Medicine

## 2016-10-16 ENCOUNTER — Encounter: Payer: Self-pay | Admitting: Cardiology

## 2016-10-17 ENCOUNTER — Encounter: Payer: Self-pay | Admitting: Cardiology

## 2016-10-17 ENCOUNTER — Ambulatory Visit (INDEPENDENT_AMBULATORY_CARE_PROVIDER_SITE_OTHER): Payer: Medicare Other | Admitting: Cardiology

## 2016-10-17 VITALS — BP 140/58 | HR 66 | Ht 62.0 in | Wt 132.2 lb

## 2016-10-17 DIAGNOSIS — I10 Essential (primary) hypertension: Secondary | ICD-10-CM

## 2016-10-17 DIAGNOSIS — I951 Orthostatic hypotension: Secondary | ICD-10-CM

## 2016-10-17 DIAGNOSIS — I480 Paroxysmal atrial fibrillation: Secondary | ICD-10-CM

## 2016-10-17 MED ORDER — AMLODIPINE BESYLATE 2.5 MG PO TABS: 2.5000 mg | ORAL_TABLET | Freq: Every day | ORAL | 3 refills | Status: DC

## 2016-10-17 NOTE — Patient Instructions (Signed)
Medication Instructions:  Your physician has recommended you make the following change in your medication:  1. DECREASE Amlodipine to 2.5 mg once daily   Follow-Up: Your physician wants you to follow-up in: 6 months with Dr. Saunders Revel. You will receive a reminder letter in the mail two months in advance. If you don't receive a letter, please call our office to schedule the follow-up appointment.  It was a pleasure seeing you today here in the office. Please do not hesitate to give [place] a call back if you have any further questions. [place], BSN

## 2016-10-17 NOTE — Progress Notes (Signed)
Cardiology Office Note   Date:  10/17/2016   ID:  Sydney Mcdaniel, DOB 10/09/1924, MRN [id number]  Referring Doctor:  Enid Derry, MD   Cardiologist:   Wende Bushy, MD   Reason for consultation:  Chief Complaint  Patient presents with  . other    syncope.  Pt c/o dizziness (passed out 4/9), 3 episodes with head feeling funny (4/13, 4/19, 4/22)  Meds reviewed verbally with patient.        History of Present Illness: Sydney Mcdaniel is a 83 y.o. female who presents for Follow-up for hypertension, atrial fibrillation   Review medical records show: October 2017: Episode occurred some time a week ago. Patient was at home. The daughter just left around 2:30 or 3:30 in the afternoon to go back to her own house. Patient then walked outside to go to the mailbox. She then decided to do some cleaning in the yard picking up some twigs she probably spent around 10-15 minutes in the yard. It was a very hot day. She felt like she needed to go back to the house and drink something cold. She got to her kitchen. She opened up a bottle of likely [facility], poured into her glass. She remembers putting back the lid on the bottle. And after that, the next thing she remembers was that she was on the ground. She fell down to her sideways, and came to when she heard the alarm of her monitor going off. She was unable to get up and she will felt weak. Based on the timing and the recollection from her and from her daughter, she was likely out for less than a minute or so. She denies getting  chest pain or shortness of breath or palpitations before the episode. All she could remember was she felt she needed to drink something as she will it was very hot outside.  She then started checking her blood pressure after that event. She noticed that the top number was running relatively low in the 100s and 100 teens. Previously was in the 120s and 130s. That was something new for her. she eventually followed up for her  PCP and the thinking was the episode may have been related to relative hypotension, especially on a very hot day. Medications were adjusted. Amlodipine was stopped. Lisinopril was reduced to half her usual dose.  Stress testing was nonischemic. Echo showed normal LV function. 48 hour Holter did not reveal any significant arrhythmias or pauses. There was some concern that maybe low blood pressure was playing a role in her medications were adjusted by her primary care provider. In late November 2017, she had recurrent near syncope that occurred after awaking and feeling poorly with chest discomfort. EMS came and felt she was stable however one she stood up, she became diaphoretic and lightheaded. She was taken to [facility] and subsequently admitted. Cardiac markers were negative. She was seen by our team and noted to have a 4.2 second pause during sleep and was evaluated by electrophysiology. 30 day event monitor was recommended. Blood pressures fluctuated quite a bit and she was discharged off of all antihypertensives. She had previously been taking lisinopril 10 mg and amlodipine 5 mg daily.  She was seen by NP on 12/19 c/o fatigue, rising BPs @ home, DOE, and cough.  Wt was up 8 lbs above baseline.  She had very diminished breath sounds in the left base.  CXR was performed and showed L>R pleural effusions.  PNA  could not be ruled out and radiology rec f/u CXR after abx course. Started low dose lasix for suspicion that diast failure in the setting of elevated BPs while off of previous antihypertensive Rx . Amlodipine 5 g also started. On the cecum visit 06/16/2016, patient was feeling much better and responded to the medication. Repeat x-ray was planned prior to next follow-up.  On 06/17/2016, patient had another episode of passing out at home. Noted at breakfast table, shortly after waking up that day. No complaints of chest pain or shortness of breath. She was sitting at the table when it happened.  She was brought to the floor by family when she failed to respond appropriately and sluggish to respond. Episode lasted only a few seconds. Blood pressure was checked around that time and was noted to be relatively much lower, with systolics in the 001V. At that point, called to cardiology was made to. Covering nurse practitioner instructed him to reduce the Lasix to every other day, and to take amlodipine only when blood pressure systolic is more than 494.  Since last visit, she had one other episode of syncope, deemed vasovagal or orthostatic from 10/02/2016. The context was similar to previous episodes. She had been sitting on her bed for 20-25 minutes doing her bills and started feeling thirsty. She walked to the kitchen to get the drink and found herself on the floor. Life alert went out. Her daughter came back to see her and she was on the floor some bruising on the left side of the thigh. No head injury. Loss of consciousness was likely a few seconds only.  Review blood pressure log shows that her blood pressure has been well-controlled 140s Max, lowest 496P systolic. Previously it had been Max 150s to 160s. It could've been that her blood pressure has been relatively lower enough to cause orthostatic change.   ROS:  Please see the history of present illness. Aside from mentioned under HPI, all other systems are reviewed and negative.    Past Medical History:  Diagnosis Date  . Allergy   . Anemia   . Cataract   . Glaucoma   . H/O: hysterectomy    Total  . Hyperlipidemia   . Hypertension   . Hypothyroidism   . Impaired fasting glucose   . Lichen sclerosus   . Osteoporosis    Hips  . Syncope    a. 03/2016 Echo: EF55-60%, no rwma, mild AI/MR, nl PASP; b. 03/2016 48h Holter: no significant arrhythmias/pauses; c. 03/2016 MV: mild apical defect, likely breast attenuation, nl EF, low risk.    Past Surgical History:  Procedure Laterality Date  . APPENDECTOMY    . EYE SURGERY      Glaucoma  . TOTAL ABDOMINAL HYSTERECTOMY       reports that she has never smoked. She has never used smokeless tobacco. She reports that she does not drink alcohol or use drugs.   family history includes Glaucoma in her mother; Heart attack in her brother, father, and mother; Parkinson's disease in her brother.   Outpatient Medications Prior to Visit  Medication Sig Dispense Refill  . ACCU-CHEK SOFTCLIX LANCETS lancets Use as instructed; check once a day; H40.1131 100 each 12  . aspirin 81 MG tablet Take 81 mg by mouth daily.    Marland Kitchen atorvastatin (LIPITOR) 20 MG tablet Take 1 tablet (20 mg total) by mouth at bedtime. 90 tablet 3  . Calcium Carb-Cholecalciferol (CALCIUM 600 + D PO) Take by mouth daily.    Marland Kitchen  cholecalciferol (VITAMIN D) 1000 units tablet Take 1,000 Units by mouth daily.    Marland Kitchen glucose blood (ACCU-CHEK AVIVA PLUS) test strip Use as instructed, check once a day; H40.1131 100 each 3  . ipratropium (ATROVENT) 0.03 % nasal spray Place 2 sprays into both nostrils 3 (three) times daily. 90 mL 3  . latanoprost (XALATAN) 0.005 % ophthalmic solution Place 1 drop into the right eye at bedtime.     Marland Kitchen levothyroxine (SYNTHROID, LEVOTHROID) 75 MCG tablet TAKE ONE TABLET BY MOUTH ON MONDAY, WEDNESDAY AND FRIDAY. 40 tablet 2  . levothyroxine (SYNTHROID, LEVOTHROID) 88 MCG tablet TAKE ONE TABLET BY MOUTH ON TUESDAY, THURSDAY, SATURDAY, AND SUNDAY 52 tablet 2  . Multiple Vitamins-Minerals (MULTIVITAMIN ADULT PO) Take by mouth.    . Omega-3 Fatty Acids (FISH OIL) 1200 MG CAPS Take by mouth daily.    . rivaroxaban (XARELTO) 20 MG TABS tablet Take 1 tablet (20 mg total) by mouth daily with supper. 30 tablet 6  . timolol (TIMOPTIC) 0.5 % ophthalmic solution Place 1 drop into the right eye 2 (two) times daily.     Marland Kitchen lisinopril (PRINIVIL,ZESTRIL) 5 MG tablet Take 1 tablet (5 mg total) by mouth daily. 90 tablet 3  . amLODipine (NORVASC) 5 MG tablet Take 1 tablet (5 mg total) by mouth daily. 30 tablet 6    No facility-administered medications prior to visit.      Allergies: Alphagan [brimonidine] and Pravachol [pravastatin sodium]    PHYSICAL EXAM: VS:  BP (!) 140/58 (BP Location: Left Arm, Patient Position: Sitting, Cuff Size: Normal)   Pulse 66   Ht 5\' 2"  (1.575 m)   Wt 132 lb 4 oz (60 kg)   LMP  (LMP Unknown)   BMI 24.19 kg/m  , Body mass index is 24.19 kg/m. Wt Readings from Last 3 Encounters:  10/17/16 132 lb 4 oz (60 kg)  10/02/16 128 lb (58.1 kg)  09/12/16 128 lb (58.1 kg)    Orthostatic VS for the past 24 hrs:  BP- Lying Pulse- Lying BP- Sitting Pulse- Sitting BP- Standing at 0 minutes Pulse- Standing at 0 minutes  10/17/16 1419 152/60 71 128/58 67 130/58 69      GENERAL:  well developed, well nourished, obese, not in acute distress HEENT: normocephalic, pink conjunctivae, anicteric sclerae, no xanthelasma, normal dentition, oropharynx clear NECK:  no neck vein engorgement, JVP normal, no hepatojugular reflux, carotid upstroke brisk and symmetric, no bruit, no thyromegaly, no lymphadenopathy LUNGS:  good respiratory effort, clear to auscultation bilaterally CV:  PMI not displaced, no thrills, no lifts, S1 and S2 within normal limits, no palpable S3 or S4, no murmurs, no rubs, no gallops ABD:  Soft, nontender, nondistended, normoactive bowel sounds, no abdominal aortic bruit, no hepatomegaly, no splenomegaly MS: nontender back, no kyphosis, no scoliosis, no joint deformities EXT:  2+ DP/PT pulses, no edema, no varicosities, no cyanosis, no clubbing SKIN: warm, nondiaphoretic, normal turgor, no ulcers NEUROPSYCH: alert, oriented to person, place, and time, sensory/motor grossly intact, normal mood, appropriate affect     Recent Labs: 12/21/2015: Magnesium 2.2 04/21/2016: ALT 18 06/27/2016: TSH 2.158 10/02/2016: BUN 18; Creatinine, Ser 0.70; Hemoglobin 10.8; Platelets 218; Potassium 4.3; Sodium 129   Lipid Panel    Component Value Date/Time   CHOL 128 12/21/2015  0950   CHOL 141 08/24/2015 0856   TRIG 111 12/21/2015 0950   HDL 62 12/21/2015 0950   HDL 52 08/24/2015 0856   CHOLHDL 2.1 12/21/2015 0950   VLDL 22 12/21/2015 0950  [place] 44 12/21/2015 0950   LDLCALC 64 08/24/2015 0856     Other studies Reviewed:  EKG:  The ekg from 03/29/2016 was personally reviewed by me and it revealed sinus rhythm, 71 BPM. First-degree AV block. Incomplete left bundle-branch block.  Additional studies/ records that were reviewed personally reviewed by me today include:   Echo 04/17/2016: Left ventricle: The cavity size was normal. Systolic function was   normal. The estimated ejection fraction was in the range of 55%   to 60%. Wall motion was normal; there were no regional wall   motion abnormalities. Left ventricular diastolic function   parameters were normal. - Aortic valve: There was mild regurgitation. - Mitral valve: There was mild regurgitation. - Left atrium: The atrium was normal in size. - Right ventricle: Systolic function was normal. - Pulmonary arteries: Systolic pressure was within the normal   range.  Holter 04/26/2016: Overall rhythm was sinus. The heart rate ranged from 44-85 bpm, average of 60 BPM. Note of first-degree AV block. Longest RR interval was within normal limits at 1.44 seconds.  Supraventricular ectopy: 274 isolated PACs, 48 atrial couplets. 2 short atrial runs - 4 beats at 87 BPM, 4 beats at 95 BPM.  Ventricular ectopy: 28 isolated PVCs.  No evidence of atrial fibrillation.  Nuclear stress is 04/10/2016:  There was no ST segment deviation noted during stress.  Defect 1: There is a small defect of mild severity present in the apex location. This is likely due to breast attenuation.  The study is normal.  This is a low risk study.  The left ventricular ejection fraction is normal (55-65%).  Event monitor 07/20/2016: Monitoring period was 06/15/2016 to 07/14/2016.  Evidence of atrial fibrillation, 4% of the  total duration. Heart rate ranged from 59-118 bpm, average of 81 BPM.   Overall rhythm was sinus with first-degree AV block and bundle branch block. Cardiac ranged from 53-190 BPM, average 72 BPM.  Manually detected events most to correspond with the baseline rhythm of sinus with first-degree AV block.  No significant pauses were detected.   ASSESSMENT AND PLAN:  Episode of passing out  History of slow heart rate Abnormal EKG with first-degree AV block and incomplete left bundle branch block normal LV function on echo, no ischemia on Myoview, and no evidence of significant pauses or arrhythmia on monitoring.  Most recent episode 4/9 again also most likely consistent with orthostasis.  It appears that she is less likely to have orthostasis when BP is > 140. Will tolerate 140s-160s to avoid further events. Decrease amlodipine to 2.5mg  po qd. Monitor BP. Continue hydration but increase low calorie gatorade/powerade -- only drinking 1 cup a day with 8 glasses of water a day. Trial with freq feeds. Cont compression stockings, move feet and toes a lot when in stationary position.  ffup in office if with any issues.  Paroxysmal atrial fibrillation with ventricular rates control. Recommendations the same as from 09/12/2016: Recommend DOAC for CHADS2-VASc= at least 4 Continue Xarelto 20mg  po qd. If unable to afford, we will refer to Coumadin clinic. INR goal 2-3.  reeval if continue to have falls  Hypertension See rec as above.  Will tolerate higher BP as disscussed above.                         Episode of shortness of breath and weight gain, likely related to acute on chronic diastolic congestive heart failure Pleural effusion, improved on repeat chest x-ray  No evidence of CHF on today's visit. Continue daily weights. Do not see any need for Lasix as of this point. recs the same as 09/12/2016   Hyperlipidemia PCP following labs.   Current medicines are reviewed at length with  the patient today.  The patient does not have concerns regarding medicines.  Labs/ tests ordered today include:  No orders of the defined types were placed in this encounter.   I had a lengthy and detailed discussion with the patient regarding diagnoses, prognosis, diagnostic options, treatment options , and side effects of medications.   I counseled the patient on importance of lifestyle modification including heart healthy diet, regular physical activity .   Disposition:   FU with cardiology as scheduled   I spent at least 40 minutes with the patient today and more than 50% of the time was spent counseling the patient and coordinating care.     Signed, Wende Bushy, MD  10/17/2016 11:40 PM    [place]  This note was generated in part with voice recognition software and I apologize for any typographical errors that were not detected and corrected.

## 2016-10-20 ENCOUNTER — Encounter: Payer: Self-pay | Admitting: Family Medicine

## 2016-10-20 ENCOUNTER — Telehealth: Payer: Self-pay | Admitting: Family Medicine

## 2016-10-20 DIAGNOSIS — E871 Hypo-osmolality and hyponatremia: Secondary | ICD-10-CM | POA: Insufficient documentation

## 2016-10-20 DIAGNOSIS — J90 Pleural effusion, not elsewhere classified: Secondary | ICD-10-CM | POA: Insufficient documentation

## 2016-10-20 NOTE — Assessment & Plan Note (Signed)
Low sodium; get CXR, f/u with chest CT if not clear

## 2016-10-20 NOTE — Assessment & Plan Note (Signed)
Worked in [employer] all her life; let's get CXR, then follow with chest CT if 100% clear

## 2016-10-20 NOTE — Telephone Encounter (Signed)
I talked with patient's daughter They thought she had another vasovagal reflex, passed out again Sodium was a little low, noted by heme-onc doctor She is drinking low calorie gatorade along with water to hydrate her On Friday, she had a spell, felt anxious and head didn't feel right; they laid her down, checked her BP; got better, then another spell the next Thursday, then another last Sunday; they called cardiologist and talked with her again They went on Tuesday to see cardiologist Reviewed cardiologist note They decreased her CCB Spit up something in the night, kept in a cup; barely covered the bottom of a plastic solo cup; reddish brown; had eaten chocolate pudding the night before; they said they couldn't test that at cardiologist, see her PCP Happened again Tuesday, not Wed night; happened again last night Reviewed the last two chest xrays; just when laying down Let's get a CXR, then follow chest CT if not 100% clear; worked in [employer] all her life Keep an eye on symptoms  Aside: Low sodium makes me want to r/o lung malignancy

## 2016-10-23 ENCOUNTER — Ambulatory Visit
Admission: RE | Admit: 2016-10-23 | Discharge: 2016-10-23 | Disposition: A | Discharge status: Home / Self Care | Payer: Medicare Other | Source: Ambulatory Visit | Attending: Family Medicine | Admitting: Family Medicine

## 2016-10-23 DIAGNOSIS — I700 Atherosclerosis of aorta: Secondary | ICD-10-CM | POA: Diagnosis not present

## 2016-10-23 DIAGNOSIS — J90 Pleural effusion, not elsewhere classified: Secondary | ICD-10-CM

## 2016-10-23 DIAGNOSIS — E871 Hypo-osmolality and hyponatremia: Secondary | ICD-10-CM | POA: Diagnosis not present

## 2016-10-24 ENCOUNTER — Encounter: Payer: Self-pay | Admitting: Family Medicine

## 2016-10-24 DIAGNOSIS — E119 Type 2 diabetes mellitus without complications: Secondary | ICD-10-CM | POA: Diagnosis not present

## 2016-10-24 DIAGNOSIS — R042 Hemoptysis: Secondary | ICD-10-CM

## 2016-10-24 DIAGNOSIS — H401131 Primary open-angle glaucoma, bilateral, mild stage: Secondary | ICD-10-CM | POA: Diagnosis not present

## 2016-10-24 DIAGNOSIS — E871 Hypo-osmolality and hyponatremia: Secondary | ICD-10-CM

## 2016-10-24 LAB — HM DIABETES EYE EXAM

## 2016-10-25 DIAGNOSIS — R042 Hemoptysis: Secondary | ICD-10-CM | POA: Insufficient documentation

## 2016-10-26 ENCOUNTER — Encounter: Payer: Self-pay | Admitting: Cardiology

## 2016-10-30 ENCOUNTER — Inpatient Hospital Stay: Payer: Medicare Other | Attending: Hematology and Oncology

## 2016-10-30 ENCOUNTER — Encounter: Payer: Self-pay | Admitting: Family Medicine

## 2016-10-30 DIAGNOSIS — D649 Anemia, unspecified: Secondary | ICD-10-CM | POA: Insufficient documentation

## 2016-10-30 DIAGNOSIS — C9110 Chronic lymphocytic leukemia of B-cell type not having achieved remission: Secondary | ICD-10-CM | POA: Insufficient documentation

## 2016-10-30 DIAGNOSIS — E871 Hypo-osmolality and hyponatremia: Secondary | ICD-10-CM

## 2016-10-30 DIAGNOSIS — Z79899 Other long term (current) drug therapy: Secondary | ICD-10-CM | POA: Insufficient documentation

## 2016-10-30 DIAGNOSIS — R042 Hemoptysis: Secondary | ICD-10-CM

## 2016-10-30 DIAGNOSIS — E538 Deficiency of other specified B group vitamins: Secondary | ICD-10-CM

## 2016-10-30 MED ORDER — CYANOCOBALAMIN 1000 MCG/ML IJ SOLN
1000.0000 ug | Freq: Once | INTRAMUSCULAR | Status: AC
  Administered 2016-10-30: 1000 ug via INTRAMUSCULAR
  Filled 2016-10-30: qty 1

## 2016-10-30 NOTE — Telephone Encounter (Signed)
See Mychart message from daughter; put in high urgency referral to pulm for consideration of bronch

## 2016-11-07 ENCOUNTER — Ambulatory Visit
Admission: RE | Admit: 2016-11-07 | Discharge: 2016-11-07 | Disposition: A | Discharge status: Home / Self Care | Payer: Medicare Other | Source: Ambulatory Visit | Attending: Family Medicine | Admitting: Family Medicine

## 2016-11-07 DIAGNOSIS — I2510 Atherosclerotic heart disease of native coronary artery without angina pectoris: Secondary | ICD-10-CM | POA: Diagnosis not present

## 2016-11-07 DIAGNOSIS — I700 Atherosclerosis of aorta: Secondary | ICD-10-CM | POA: Diagnosis not present

## 2016-11-07 DIAGNOSIS — E871 Hypo-osmolality and hyponatremia: Secondary | ICD-10-CM

## 2016-11-07 DIAGNOSIS — R042 Hemoptysis: Secondary | ICD-10-CM

## 2016-11-07 DIAGNOSIS — K8020 Calculus of gallbladder without cholecystitis without obstruction: Secondary | ICD-10-CM | POA: Insufficient documentation

## 2016-11-07 DIAGNOSIS — J9811 Atelectasis: Secondary | ICD-10-CM | POA: Insufficient documentation

## 2016-11-07 DIAGNOSIS — R918 Other nonspecific abnormal finding of lung field: Secondary | ICD-10-CM | POA: Diagnosis not present

## 2016-11-07 MED ORDER — IOPAMIDOL (ISOVUE-300) INJECTION 61%
75.0000 mL | Freq: Once | INTRAVENOUS | Status: AC | PRN
  Administered 2016-11-07: 75 mL via INTRAVENOUS

## 2016-11-25 NOTE — Progress Notes (Signed)
[facility] day:  11/27/16  Chief Complaint: Sydney Mcdaniel is a 83 y.o. female with chronic lymphocytic leukemia, B12 deficiency, and anemia who is seen for 3 month assessment.  HPI:  The patient was last seen in the hematology clinic on 09/04/2016.  At that time, she denied any B symptoms.  Exam revealed no adenopathy or hepatosplenomegaly.  Exam revealed no palpable adenopathy or hepatosplenomegaly.  Hematocrit was 30.8.  She was seen in the ER at [facility] on 10/02/2016 secondary to loss of consciousness.  CBC revealed a hematocrit 32.5, hemaglobin 10.8, MCV 85.9, platelets 218,000 and white count 10,700.  Etiology was felt secondary to vasovagal syncope.  Symptomatically, she notes 2 episodes of syncope after B12 injections.  She states that her blood pressure medications are being titrated.  She also notes a cough at night.  CXR and chest CT were negative per patient.  She has an appointment with pulmonary medicine in the AM.  Weight is stable 126 - 128.   Past Medical History:  Diagnosis Date  . Allergy   . Anemia   . Cataract   . Glaucoma   . H/O: hysterectomy    Total  . Hyperlipidemia   . Hypertension   . Hypothyroidism   . Impaired fasting glucose   . Lichen sclerosus   . Osteoporosis    Hips  . Syncope    a. 03/2016 Echo: EF55-60%, no rwma, mild AI/MR, nl PASP; b. 03/2016 48h Holter: no significant arrhythmias/pauses; c. 03/2016 MV: mild apical defect, likely breast attenuation, nl EF, low risk.    Past Surgical History:  Procedure Laterality Date  . APPENDECTOMY    . EYE SURGERY     Glaucoma  . TOTAL ABDOMINAL HYSTERECTOMY      Family History  Problem Relation Age of Onset  . Heart attack Mother   . Glaucoma Mother   . Heart attack Father   . Parkinson's disease Brother   . Heart attack Brother     Social History:  reports that she has never smoked. She has never used smokeless tobacco. She reports that she does  not drink alcohol or use drugs.  Her husband died in 12/26/14.  She lives in [place].  She is accompanied by her daughter, [name].  Allergies:  Allergies  Allergen Reactions  . Alphagan [Brimonidine] Itching and Rash  . Pravachol [Pravastatin Sodium] Itching and Rash    Current Medications: Current Outpatient Prescriptions  Medication Sig Dispense Refill  . ACCU-CHEK SOFTCLIX LANCETS lancets Use as instructed; check once a day; H40.1131 100 each 12  . amLODipine (NORVASC) 2.5 MG tablet Take 1 tablet (2.5 mg total) by mouth daily. 90 tablet 3  . aspirin 81 MG tablet Take 81 mg by mouth daily.    Marland Kitchen atorvastatin (LIPITOR) 20 MG tablet Take 1 tablet (20 mg total) by mouth at bedtime. 90 tablet 3  . Calcium Carb-Cholecalciferol (CALCIUM 600 + D PO) Take by mouth daily.    . cholecalciferol (VITAMIN D) 1000 units tablet Take 1,000 Units by mouth daily.    Marland Kitchen glucose blood (ACCU-CHEK AVIVA PLUS) test strip Use as instructed, check once a day; H40.1131 100 each 3  . latanoprost (XALATAN) 0.005 % ophthalmic solution Place 1 drop into the right eye at bedtime.     Marland Kitchen levothyroxine (SYNTHROID, LEVOTHROID) 75 MCG tablet TAKE ONE TABLET BY MOUTH ON MONDAY, WEDNESDAY AND FRIDAY. 40 tablet 2  . levothyroxine (SYNTHROID, LEVOTHROID) 88 MCG  tablet TAKE ONE TABLET BY MOUTH ON TUESDAY, THURSDAY, SATURDAY, AND SUNDAY 52 tablet 2  . lisinopril (PRINIVIL,ZESTRIL) 5 MG tablet Take 1 tablet (5 mg total) by mouth daily. 90 tablet 3  . Multiple Vitamins-Minerals (MULTIVITAMIN ADULT PO) Take by mouth.    . Omega-3 Fatty Acids (FISH OIL) 1200 MG CAPS Take by mouth daily.    . rivaroxaban (XARELTO) 20 MG TABS tablet Take 1 tablet (20 mg total) by mouth daily with supper. 30 tablet 6  . timolol (TIMOPTIC) 0.5 % ophthalmic solution Place 1 drop into the right eye 2 (two) times daily.     Marland Kitchen ipratropium (ATROVENT) 0.03 % nasal spray Place 2 sprays into both nostrils 3 (three) times daily. (Patient not taking: Reported on  11/27/2016) 90 mL 3   No current facility-administered medications for this visit.     Review of Systems:  GENERAL:  Feels "ok".  No fevers or sweats.  Weight up 2 pounds in 3 months. PERFORMANCE STATUS (ECOG):  1 HEENT:  Glaucoma.  No visual changes, runny nose, sore throat, mouth sores or tenderness. Lungs: No shortness of breath.  Cough at night (see HPI).  No hemoptysis. Cardiac:  Atrial fibrillation.  No chest pain, palpitations, orthopnea, or PND.  BP meds titrated. GI:  Burps a lot.  No nausea, vomiting, diarrhea, constipation, melena or hematochezia.  Colonoscopy > 10 years ago. GU:  No urgency, frequency, dysuria, or hematuria. Up 2-3 times at night.  Drinking Gatorade. Musculoskeletal:  No back pain.  No joint pain.  No muscle tenderness. Extremities:  No pain or swelling. Skin:  No rashes or skin changes. Neuro:  Syncope x 2.  No headache, numbness or weakness, balance or coordination issues. Endocrine:  No diabetes.  On Synthroid for thyroid disease.  No hot flashes or night sweats. Psych:  No mood changes, depression or anxiety. Pain:  No focal pain. Review of systems:  All other systems reviewed and found to be negative.  Physical Exam: Blood pressure (!) 161/61, pulse 60, temperature 98.1 F (36.7 C), temperature source Tympanic, weight 131 lb 4 oz (59.5 kg). GENERAL:  Well developed, well nourished, woman sitting comfortably in the exam room in no acute distress.  She has a cane at her side. MENTAL STATUS:  Alert and oriented to person, place and time. HEAD:  Curly dark blonde hair.  Normocephalic, atraumatic, face symmetric, no Cushingoid features. EYES:  Glasses.  Blue eyes.  Pupils equal round and reactive to light and accomodation.  No conjunctivitis or scleral icterus. ENT:  Oropharynx clear without lesion.  Tongue normal. Mucous membranes moist.  RESPIRATORY:  Clear to auscultation without rales, wheezes or rhonchi. CARDIOVASCULAR: Regular rate and rhythm without  murmur, rub or gallop. ABDOMEN:  Soft, non-tender, with active bowel sounds, and no hepatosplenomegaly.  No masses. SKIN:  No rashes, ulcers or lesions. EXTREMITIES: Trace lower extremity edema.  No skin discoloration or tenderness.  No palpable cords. LYMPH NODES: No palpable cervical, supraclavicular, axillary or inguinal adenopathy  NEUROLOGICAL: Unremarkable. PSYCH:  Appropriate.   Appointment on 11/27/2016  Component Date Value Ref Range Status  . WBC 11/27/2016 6.7  3.6 - 11.0 K/uL Final  . RBC 11/27/2016 3.38* 3.80 - 5.20 MIL/uL Final  . Hemoglobin 11/27/2016 10.5* 12.0 - 16.0 g/dL Final  . HCT 11/27/2016 30.3* 35.0 - 47.0 % Final  . MCV 11/27/2016 89.7  80.0 - 100.0 fL Final  . MCH 11/27/2016 31.0  26.0 - 34.0 pg Final  . MCHC 11/27/2016 34.5  32.0 - 36.0 g/dL Final  . RDW 11/27/2016 15.0* 11.5 - 14.5 % Final  . Platelets 11/27/2016 195  150 - 440 K/uL Final  . Neutrophils Relative % 11/27/2016 40  % Final  . Neutro Abs 11/27/2016 2.7  1.4 - 6.5 K/uL Final  . Lymphocytes Relative 11/27/2016 49  % Final  . Lymphs Abs 11/27/2016 3.2  1.0 - 3.6 K/uL Final  . Monocytes Relative 11/27/2016 9  % Final  . Monocytes Absolute 11/27/2016 0.6  0.2 - 0.9 K/uL Final  . Eosinophils Relative 11/27/2016 2  % Final  . Eosinophils Absolute 11/27/2016 0.1  0 - 0.7 K/uL Final  . Basophils Relative 11/27/2016 0  % Final  . Basophils Absolute 11/27/2016 0.0  0 - 0.1 K/uL Final  . Sodium 11/27/2016 131* 135 - 145 mmol/L Final  . Potassium 11/27/2016 4.6  3.5 - 5.1 mmol/L Final  . Chloride 11/27/2016 99* 101 - 111 mmol/L Final  . CO2 11/27/2016 26  22 - 32 mmol/L Final  . Glucose, Bld 11/27/2016 99  65 - 99 mg/dL Final  . BUN 11/27/2016 15  6 - 20 mg/dL Final  . Creatinine, Ser 11/27/2016 0.70  0.44 - 1.00 mg/dL Final  . Calcium 11/27/2016 9.2  8.9 - 10.3 mg/dL Final  . GFR calc non Af Amer 11/27/2016 >60  >60 mL/min Final  . GFR calc Af Amer 11/27/2016 >60  >60 mL/min Final   Comment:  (NOTE) The eGFR has been calculated using the CKD EPI equation. This calculation has not been validated in all clinical situations. eGFR's persistently <60 mL/min signify possible Chronic Kidney Disease.   . Anion gap 11/27/2016 6  5 - 15 Final    Assessment:  EVERLEAN BUCHER is a 83 y.o. female with stage 0 CLL.  She presented with a distant history of anemia and recent lymphocytosis.  She was noted to have mild lymphocystosis in 06/22/2015.  Absolute lymphocyte count (ALC) has ranged between 4700 - 5500.  Work-up on 01/10/2016 revealed a hematocrit of 31.6, hemoglobin 11.4, MCV 89, platelets 203,000, WBC 11,00 with an ANC of 3700.  Absolute lymphocyte count was 6400.  Ferritin was 116.  B12 was 119 (low).  Folate was 44.0.  Retic was 0.9% (low).  Uric acid was 4.5.  Guaiac cards were negative x 3 in 12/2015.  She was diagnosed with B12 deficiency.  She receives B12 monthly (last 08/07/2016).  Folate was 38 on 08/07/2016.  Peripheral blood flow cytometry revealed involvement by a CD5+, CD23+, CD38- monoclonal B cell population with lambda light chain restriction consistent with CLL/SLL.  There was a small population (8% of the T cells) of double positive (CD4+/CD8+) T cells (described in association with chronic viral infections, autoimmune disorders, chronic inflammatory disorders, and immunodeficiency states).   She has a slight progressive normocytic anemia.  Work-up on 08/07/2016 revealed the following normal labs: creatinine, LDH, uric acid, ferritin (105), iron studies (12% sat; TIBC 295), and folate.  Retic was 1.1% (low for level of anemia).  Coombs was negative on 09/04/2016.  Diet is modest.  She denies any melena or hematochezia.  Guaiac cards were negative x 3 in 07/2016.  Last colonoscopy was > 10 years ago.  She was diagnosed with atrial fibrillation after presenting with syncopal episodes.  She is on Xarelto.  Chest CT on 11/07/2016 revealed two 2 mm right lung nodules (right  major fissure and lateral aspect RUL).  Non-contrast chest CT can be considered in 12 months if  patient is high-risk.   There was a 5 mm right lower lobe ground-glass nodule.  She has an appointment with pulmonology.  Symptomatically, she denies any B symptoms.  Exam reveals no palpable adenopathy or hepatosplenomegaly.  Hematocrit is 30.3 and hemoglobin 10.5.  Plan: 1.  Labs today:  CBC with diff, BMP, ferritin, sed rate. 2.  Discuss continued observation without intervention for CLL. 3.  Discuss pulmonary evaluation. 4.  B12 today and monthly x 2 for B12. 5.  RTC in 3 months for MD assessment, labs (CBC with diff, BMP, ferritin) + B12.   Lequita Asal, MD  11/27/2016, 11:46 AM

## 2016-11-27 ENCOUNTER — Inpatient Hospital Stay: Payer: Medicare Other

## 2016-11-27 ENCOUNTER — Telehealth: Payer: Self-pay | Admitting: *Deleted

## 2016-11-27 ENCOUNTER — Inpatient Hospital Stay (HOSPITAL_BASED_OUTPATIENT_CLINIC_OR_DEPARTMENT_OTHER): Payer: Medicare Other | Admitting: Hematology and Oncology

## 2016-11-27 ENCOUNTER — Inpatient Hospital Stay: Payer: Medicare Other | Attending: Hematology and Oncology

## 2016-11-27 ENCOUNTER — Encounter: Payer: Self-pay | Admitting: Hematology and Oncology

## 2016-11-27 VITALS — BP 161/61 | HR 60 | Temp 98.1°F | Wt 131.2 lb

## 2016-11-27 DIAGNOSIS — Z7901 Long term (current) use of anticoagulants: Secondary | ICD-10-CM

## 2016-11-27 DIAGNOSIS — C9110 Chronic lymphocytic leukemia of B-cell type not having achieved remission: Secondary | ICD-10-CM | POA: Diagnosis not present

## 2016-11-27 DIAGNOSIS — E785 Hyperlipidemia, unspecified: Secondary | ICD-10-CM | POA: Diagnosis not present

## 2016-11-27 DIAGNOSIS — D649 Anemia, unspecified: Secondary | ICD-10-CM | POA: Diagnosis not present

## 2016-11-27 DIAGNOSIS — R918 Other nonspecific abnormal finding of lung field: Secondary | ICD-10-CM

## 2016-11-27 DIAGNOSIS — E039 Hypothyroidism, unspecified: Secondary | ICD-10-CM | POA: Insufficient documentation

## 2016-11-27 DIAGNOSIS — Z79899 Other long term (current) drug therapy: Secondary | ICD-10-CM

## 2016-11-27 DIAGNOSIS — I4891 Unspecified atrial fibrillation: Secondary | ICD-10-CM | POA: Diagnosis not present

## 2016-11-27 DIAGNOSIS — E538 Deficiency of other specified B group vitamins: Secondary | ICD-10-CM

## 2016-11-27 DIAGNOSIS — M818 Other osteoporosis without current pathological fracture: Secondary | ICD-10-CM | POA: Insufficient documentation

## 2016-11-27 DIAGNOSIS — H409 Unspecified glaucoma: Secondary | ICD-10-CM | POA: Insufficient documentation

## 2016-11-27 DIAGNOSIS — I10 Essential (primary) hypertension: Secondary | ICD-10-CM | POA: Insufficient documentation

## 2016-11-27 LAB — CBC WITH DIFFERENTIAL/PLATELET
Basophils Absolute: 0 10*3/uL (ref 0–0.1)
Basophils Relative: 0 %
Eosinophils Absolute: 0.1 10*3/uL (ref 0–0.7)
Eosinophils Relative: 2 %
HCT: 30.3 % — ABNORMAL LOW (ref 35.0–47.0)
Hemoglobin: 10.5 g/dL — ABNORMAL LOW (ref 12.0–16.0)
Lymphocytes Relative: 49 %
Lymphs Abs: 3.2 10*3/uL (ref 1.0–3.6)
MCH: 31 pg (ref 26.0–34.0)
MCHC: 34.5 g/dL (ref 32.0–36.0)
MCV: 89.7 fL (ref 80.0–100.0)
Monocytes Absolute: 0.6 10*3/uL (ref 0.2–0.9)
Monocytes Relative: 9 %
Neutro Abs: 2.7 10*3/uL (ref 1.4–6.5)
Neutrophils Relative %: 40 %
Platelets: 195 10*3/uL (ref 150–440)
RBC: 3.38 MIL/uL — ABNORMAL LOW (ref 3.80–5.20)
RDW: 15 % — ABNORMAL HIGH (ref 11.5–14.5)
WBC: 6.7 10*3/uL (ref 3.6–11.0)

## 2016-11-27 LAB — BASIC METABOLIC PANEL
Anion gap: 6 (ref 5–15)
BUN: 15 mg/dL (ref 6–20)
CO2: 26 mmol/L (ref 22–32)
Calcium: 9.2 mg/dL (ref 8.9–10.3)
Chloride: 99 mmol/L — ABNORMAL LOW (ref 101–111)
Creatinine, Ser: 0.7 mg/dL (ref 0.44–1.00)
GFR calc Af Amer: >60 mL/min (ref >60)
GFR calc non Af Amer: >60 mL/min (ref >60)
Glucose, Bld: 99 mg/dL (ref 65–99)
Potassium: 4.6 mmol/L (ref 3.5–5.1)
Sodium: 131 mmol/L — ABNORMAL LOW (ref 135–145)

## 2016-11-27 LAB — FERRITIN: Ferritin: 29 ng/mL (ref 11–307)

## 2016-11-27 LAB — SEDIMENTATION RATE: Sed Rate: 31 mm/hr — ABNORMAL HIGH (ref 0–30)

## 2016-11-27 MED ORDER — CYANOCOBALAMIN 1000 MCG/ML IJ SOLN
1000.0000 ug | Freq: Once | INTRAMUSCULAR | Status: AC
  Administered 2016-11-27: 1000 ug via INTRAMUSCULAR
  Filled 2016-11-27: qty 1

## 2016-11-27 NOTE — Telephone Encounter (Signed)
Restart oral iron with OJ or vitamin C, instructed to take once a day for a week and then go to bid if tolerated, voiced understanding.

## 2016-11-27 NOTE — Progress Notes (Signed)
Patient passed out on the way home after getting her B-12 injection.  States this is the second time she has passed out after getting B-12 injection. Patient is coughing up phlegm at night when trying to sleep.  She took a specimen to her cardiologist and PCP.  She has an appointment with pulmonologist tomorrow.

## 2016-11-28 ENCOUNTER — Encounter: Payer: Self-pay | Admitting: Family Medicine

## 2016-11-28 ENCOUNTER — Encounter: Payer: Self-pay | Admitting: *Deleted

## 2016-11-28 ENCOUNTER — Ambulatory Visit (INDEPENDENT_AMBULATORY_CARE_PROVIDER_SITE_OTHER): Payer: Medicare Other | Admitting: Internal Medicine

## 2016-11-28 ENCOUNTER — Telehealth: Payer: Self-pay | Admitting: Cardiology

## 2016-11-28 ENCOUNTER — Encounter: Payer: Self-pay | Admitting: Internal Medicine

## 2016-11-28 VITALS — BP 148/72 | HR 68 | Ht 62.0 in | Wt 131.0 lb

## 2016-11-28 DIAGNOSIS — R042 Hemoptysis: Secondary | ICD-10-CM | POA: Diagnosis not present

## 2016-11-28 NOTE — Telephone Encounter (Signed)
Spoke with patients daughter and she reports that patient had appointment with pulmonologist and they requested that she call to confirm she is on both aspirin 81 mg and xarelto 20 mg. She reports that her mother had been coughing up brown colored sputum at night and they had her give [place] a call to check. Reviewed chart and it is documented throughout previous office visits but let her know that I would check on this and then be in touch with her.  She was appreciative for my call and had no further questions at this time.

## 2016-11-28 NOTE — Patient Instructions (Signed)
Follow up as needed

## 2016-11-28 NOTE — Progress Notes (Signed)
Name: Sydney Mcdaniel MRN: [id number] DOB: September 09, 1924     CONSULTATION DATE: 11/28/2016  REFERRING MD : LADA  CHIEF COMPLAINT:  Coughing up brownstuff   HISTORY OF PRESENT ILLNESS:   83 yo pleasant white female seen today for hemoptysis Submassive, very light brown/dark brown does not happen every night No signs of allergies Happens at night only Denies SOB/DOE/Wheezing Has no cough No signs of infection No signs of heart failure at this time No weight loss No fevers, chills  Patient with h/o Afib started on daily  Xaralto in Jan 2018, patient started to see brown tinged spit up in March 2018 Patient also on ASA 81 mg daily Patient has spontaneous bruising while on these medications  I have reviewed CT chest and CXR with patient and there is no acute abnormality at this time  PAST MEDICAL HISTORY :   has a past medical history of Allergy; Anemia; Cataract; Glaucoma; H/O: hysterectomy; Hyperlipidemia; Hypertension; Hypothyroidism; Impaired fasting glucose; Lichen sclerosus; Osteoporosis; and Syncope.  has a past surgical history that includes Total abdominal hysterectomy; Eye surgery; and Appendectomy. Prior to Admission medications   Medication Sig Start Date End Date Taking? Authorizing Provider  ACCU-CHEK SOFTCLIX LANCETS lancets Use as instructed; check once a day; H40.1131 05/08/16   Lada, Satira Anis, MD  amLODipine (NORVASC) 2.5 MG tablet Take 1 tablet (2.5 mg total) by mouth daily. 10/17/16   Wende Bushy, MD  aspirin 81 MG tablet Take 81 mg by mouth daily.    [provider]  atorvastatin (LIPITOR) 20 MG tablet Take 1 tablet (20 mg total) by mouth at bedtime. 04/12/16   Arnetha Courser, MD  Calcium Carb-Cholecalciferol (CALCIUM 600 + D PO) Take by mouth daily.    [provider]  cholecalciferol (VITAMIN D) 1000 units tablet Take 1,000 Units by mouth daily.    [provider]  glucose blood (ACCU-CHEK AVIVA PLUS) test strip Use as instructed,  check once a day; H40.1131 05/08/16   Lada, Satira Anis, MD  ipratropium (ATROVENT) 0.03 % nasal spray Place 2 sprays into both nostrils 3 (three) times daily. Patient not taking: Reported on 11/27/2016 12/21/15   Arnetha Courser, MD  latanoprost (XALATAN) 0.005 % ophthalmic solution Place 1 drop into the right eye at bedtime.  04/24/15   [provider]  levothyroxine (SYNTHROID, LEVOTHROID) 75 MCG tablet TAKE ONE TABLET BY MOUTH ON MONDAY, WEDNESDAY AND FRIDAY. 10/02/16   Lada, Satira Anis, MD  levothyroxine (SYNTHROID, LEVOTHROID) 88 MCG tablet TAKE ONE TABLET BY MOUTH ON TUESDAY, THURSDAY, SATURDAY, AND SUNDAY 10/02/16   Lada, Satira Anis, MD  lisinopril (PRINIVIL,ZESTRIL) 5 MG tablet Take 1 tablet (5 mg total) by mouth daily. 07/11/16 11/27/16  Wende Bushy, MD  Multiple Vitamins-Minerals (MULTIVITAMIN ADULT PO) Take by mouth.    [provider]  Omega-3 Fatty Acids (FISH OIL) 1200 MG CAPS Take by mouth daily.    [provider]  rivaroxaban (XARELTO) 20 MG TABS tablet Take 1 tablet (20 mg total) by mouth daily with supper. 07/25/16   Wende Bushy, MD  timolol (TIMOPTIC) 0.5 % ophthalmic solution Place 1 drop into the right eye 2 (two) times daily.  04/24/15   [provider]   Allergies  Allergen Reactions  . Alphagan [Brimonidine] Itching and Rash  . Pravachol [Pravastatin Sodium] Itching and Rash    FAMILY HISTORY:  family history includes Glaucoma in her mother; Heart attack in her brother, father, and mother; Parkinson's disease in her brother. SOCIAL  HISTORY:  reports that she has never smoked. She has never used smokeless tobacco. She reports that she does not drink alcohol or use drugs.  REVIEW OF SYSTEMS:   Constitutional: Negative for fever, chills, weight loss, malaise/fatigue and diaphoresis.  HENT: Negative for hearing loss, ear pain, nosebleeds, congestion, sore throat, neck pain, tinnitus and ear discharge.   Eyes: Negative for blurred vision, double  vision, photophobia, pain, discharge and redness.  Respiratory: -Negative for cough, +hemoptysis, -sputum production, -shortness of breath, -wheezing and -stridor.   Cardiovascular: Negative for chest pain, palpitations, orthopnea, claudication, leg swelling and PND.  Gastrointestinal: Negative for heartburn, nausea, vomiting, abdominal pain, diarrhea, constipation, blood in stool and melena.  Genitourinary: Negative for dysuria, urgency, frequency, hematuria and flank pain.  Musculoskeletal: Negative for myalgias, back pain, joint pain and falls.  Skin: Negative for itching and rash.  Neurological: Negative for dizziness, tingling, tremors, sensory change, speech change, focal weakness, seizures, loss of consciousness, weakness and headaches.  Endo/Heme/Allergies: Negative for environmental allergies and polydipsia. Does not bruise/bleed easily.  ALL OTHER ROS ARE NEGATIVE  BP (!) 148/72 (BP Location: Right Arm, Cuff Size: Normal)   Pulse 68   Ht 5\' 2"  (1.575 m)   Wt 131 lb (59.4 kg)   LMP  (LMP Unknown)   SpO2 99%   BMI 23.96 kg/m    Physical Examination:   GENERAL:NAD, no fevers, chills, no weakness no fatigue HEAD: Normocephalic, atraumatic.  EYES: Pupils equal, round, reactive to light. Extraocular muscles intact. No scleral icterus.  MOUTH: Moist mucosal membrane.   EAR, NOSE, THROAT: Clear without exudates. No external lesions.  NECK: Supple. No thyromegaly. No nodules. No JVD.  PULMONARY:CTA B/L no wheezes, no crackles, no rhonchi CARDIOVASCULAR: S1 and S2. Regular rate and rhythm. No murmurs, rubs, or gallops. No edema.  GASTROINTESTINAL: Soft, nontender, nondistended. No masses. Positive bowel sounds.  MUSCULOSKELETAL: No swelling, clubbing, or edema. Range of motion full in all extremities.  NEUROLOGIC: Cranial nerves II through XII are intact. No gross focal neurological deficits.  SKIN: No ulceration, lesions, rashes, or cyanosis. Skin warm and dry. Turgor intact.    PSYCHIATRIC: Mood, affect within normal limits. The patient is awake, alert and oriented x 3. Insight, judgment intact.    CT chest 10/2016 I have Independently reviewed images of  CT chest   on 11/28/2016 Interpretation:no acute process, small pulm nodule in rt lung No pneumonia no masses No effusions  ASSESSMENT / PLAN: 83 yo white female with intermittent submassive hemoptysis with Afib on Oral anticoagulation with ASA therapy. The most likely cause of her hemoptysis is related to her medications.  I have explained that at this time, the benefits of Xarelto outweigh the risk of intermittent submassive hemoptysis. I have also explained that if her hemoptysis were to worsen and get bright red that she should stop the Xarelto immediately. I recommend that she would need to address ASA therapy while on xarelto, but I think that ASA 81 mg should be OK. I have also advised patient to seek advise of PCP to maybe get treated for silent GERD with PPI  Overall, Patient/Family are satisfied with Plan of action and management. All questions answered. I have instructed patient to contact us if any further questions.  Corrin Parker, M.D.  [facility]  Medical Director [place] Director [facility] Cardio-Pulmonary Department

## 2016-11-28 NOTE — Telephone Encounter (Signed)
Pt daughter daughter [name] calling stating they saw Dr Mortimer Fries this morning Pt is on xarelto and Asprin  She's been having issues with spitting up material  They were told to call us and see if should be on the Asprin addition to the xarelto   Please advise

## 2016-11-29 ENCOUNTER — Encounter: Payer: Self-pay | Admitting: *Deleted

## 2016-11-29 MED ORDER — RANITIDINE HCL 150 MG PO TABS: 150.0000 mg | ORAL_TABLET | Freq: Two times a day (BID) | ORAL | 6 refills | Status: DC

## 2016-11-29 NOTE — Telephone Encounter (Signed)
Ok to stop aspirin

## 2016-11-30 ENCOUNTER — Other Ambulatory Visit: Payer: Self-pay | Admitting: Family Medicine

## 2016-12-08 NOTE — Telephone Encounter (Signed)
BP LOG    12/04/16 0700 122/57  1000 107/46  1230 122/58  1500 145/63     1730 163/69 took half lisinopril    12/05/16 0850 126/55  1025 135/57 took half amlodipine 1450 150/62 took lisipril   1720 152/68  12/06/16 0730 123/54  1030 130/57  1430 150/64  1730 152/65   12/07/16 0730 111/50  0900 112/51  1130 153/67 took lisinopril    1715 178/74 took half amlodipine  1945 152/62  12/08/16 0715 112/57  1000 113/53  1200 124/56 took half amlodipine   1545 137/58    Patient and daughter have some concerns about bp and medication dose as she has hx of syncope please call

## 2016-12-25 ENCOUNTER — Inpatient Hospital Stay: Payer: Medicare Other | Attending: Hematology and Oncology

## 2016-12-25 DIAGNOSIS — Z79899 Other long term (current) drug therapy: Secondary | ICD-10-CM | POA: Insufficient documentation

## 2016-12-25 DIAGNOSIS — E538 Deficiency of other specified B group vitamins: Secondary | ICD-10-CM | POA: Diagnosis not present

## 2016-12-25 DIAGNOSIS — C9110 Chronic lymphocytic leukemia of B-cell type not having achieved remission: Secondary | ICD-10-CM | POA: Diagnosis not present

## 2016-12-25 DIAGNOSIS — D649 Anemia, unspecified: Secondary | ICD-10-CM | POA: Diagnosis not present

## 2016-12-25 MED ORDER — CYANOCOBALAMIN 1000 MCG/ML IJ SOLN
1000.0000 ug | Freq: Once | INTRAMUSCULAR | Status: AC
  Administered 2016-12-25: 1000 ug via INTRAMUSCULAR

## 2017-01-09 ENCOUNTER — Encounter: Payer: Self-pay | Admitting: Family Medicine

## 2017-01-09 DIAGNOSIS — K1370 Unspecified lesions of oral mucosa: Secondary | ICD-10-CM

## 2017-01-22 ENCOUNTER — Inpatient Hospital Stay: Payer: Medicare Other

## 2017-01-22 DIAGNOSIS — E538 Deficiency of other specified B group vitamins: Secondary | ICD-10-CM | POA: Diagnosis not present

## 2017-01-22 DIAGNOSIS — C9110 Chronic lymphocytic leukemia of B-cell type not having achieved remission: Secondary | ICD-10-CM | POA: Diagnosis not present

## 2017-01-22 DIAGNOSIS — Z79899 Other long term (current) drug therapy: Secondary | ICD-10-CM | POA: Diagnosis not present

## 2017-01-22 DIAGNOSIS — D649 Anemia, unspecified: Secondary | ICD-10-CM | POA: Diagnosis not present

## 2017-01-22 MED ORDER — CYANOCOBALAMIN 1000 MCG/ML IJ SOLN
1000.0000 ug | Freq: Once | INTRAMUSCULAR | Status: AC
  Administered 2017-01-22: 1000 ug via INTRAMUSCULAR
  Filled 2017-01-22: qty 1

## 2017-02-27 ENCOUNTER — Inpatient Hospital Stay: Payer: Medicare Other

## 2017-02-27 ENCOUNTER — Inpatient Hospital Stay: Payer: Medicare Other | Admitting: Hematology and Oncology

## 2017-03-06 ENCOUNTER — Telehealth: Payer: Self-pay | Admitting: *Deleted

## 2017-03-06 ENCOUNTER — Inpatient Hospital Stay (HOSPITAL_BASED_OUTPATIENT_CLINIC_OR_DEPARTMENT_OTHER): Payer: Medicare Other | Admitting: Hematology and Oncology

## 2017-03-06 ENCOUNTER — Other Ambulatory Visit: Payer: Self-pay | Admitting: *Deleted

## 2017-03-06 ENCOUNTER — Inpatient Hospital Stay: Payer: Medicare Other | Attending: Hematology and Oncology

## 2017-03-06 ENCOUNTER — Inpatient Hospital Stay: Payer: Medicare Other

## 2017-03-06 VITALS — BP 163/64 | HR 57 | Temp 97.7°F | Resp 18 | Wt 133.4 lb

## 2017-03-06 DIAGNOSIS — I4891 Unspecified atrial fibrillation: Secondary | ICD-10-CM

## 2017-03-06 DIAGNOSIS — I10 Essential (primary) hypertension: Secondary | ICD-10-CM | POA: Insufficient documentation

## 2017-03-06 DIAGNOSIS — E039 Hypothyroidism, unspecified: Secondary | ICD-10-CM | POA: Diagnosis not present

## 2017-03-06 DIAGNOSIS — D649 Anemia, unspecified: Secondary | ICD-10-CM

## 2017-03-06 DIAGNOSIS — R918 Other nonspecific abnormal finding of lung field: Secondary | ICD-10-CM

## 2017-03-06 DIAGNOSIS — Z79899 Other long term (current) drug therapy: Secondary | ICD-10-CM | POA: Insufficient documentation

## 2017-03-06 DIAGNOSIS — E538 Deficiency of other specified B group vitamins: Secondary | ICD-10-CM | POA: Diagnosis not present

## 2017-03-06 DIAGNOSIS — E785 Hyperlipidemia, unspecified: Secondary | ICD-10-CM | POA: Diagnosis not present

## 2017-03-06 DIAGNOSIS — Z7982 Long term (current) use of aspirin: Secondary | ICD-10-CM | POA: Insufficient documentation

## 2017-03-06 DIAGNOSIS — Z90710 Acquired absence of both cervix and uterus: Secondary | ICD-10-CM | POA: Insufficient documentation

## 2017-03-06 DIAGNOSIS — Z7901 Long term (current) use of anticoagulants: Secondary | ICD-10-CM | POA: Diagnosis not present

## 2017-03-06 DIAGNOSIS — C9110 Chronic lymphocytic leukemia of B-cell type not having achieved remission: Secondary | ICD-10-CM

## 2017-03-06 LAB — CBC WITH DIFFERENTIAL/PLATELET
Basophils Absolute: 0 10*3/uL (ref 0–0.1)
Basophils Relative: 1 %
Eosinophils Absolute: 0.2 10*3/uL (ref 0–0.7)
Eosinophils Relative: 2 %
HCT: 31.5 % — ABNORMAL LOW (ref 35.0–47.0)
Hemoglobin: 10.9 g/dL — ABNORMAL LOW (ref 12.0–16.0)
Lymphocytes Relative: 51 %
Lymphs Abs: 3.2 10*3/uL (ref 1.0–3.6)
MCH: 31 pg (ref 26.0–34.0)
MCHC: 34.7 g/dL (ref 32.0–36.0)
MCV: 89.4 fL (ref 80.0–100.0)
Monocytes Absolute: 0.5 10*3/uL (ref 0.2–0.9)
Monocytes Relative: 8 %
Neutro Abs: 2.4 10*3/uL (ref 1.4–6.5)
Neutrophils Relative %: 38 %
Platelets: 196 10*3/uL (ref 150–440)
RBC: 3.52 MIL/uL — ABNORMAL LOW (ref 3.80–5.20)
RDW: 15.7 % — ABNORMAL HIGH (ref 11.5–14.5)
WBC: 6.3 10*3/uL (ref 3.6–11.0)

## 2017-03-06 LAB — RETICULOCYTES
RBC.: 3.63 MIL/uL — ABNORMAL LOW (ref 3.80–5.20)
Retic Count, Absolute: 36.3 10*3/uL (ref 19.0–183.0)
Retic Ct Pct: 1 % (ref 0.4–3.1)

## 2017-03-06 LAB — BASIC METABOLIC PANEL
Anion gap: 7 (ref 5–15)
BUN: 13 mg/dL (ref 6–20)
CO2: 26 mmol/L (ref 22–32)
Calcium: 9.2 mg/dL (ref 8.9–10.3)
Chloride: 101 mmol/L (ref 101–111)
Creatinine, Ser: 0.73 mg/dL (ref 0.44–1.00)
GFR calc Af Amer: >60 mL/min (ref >60)
GFR calc non Af Amer: >60 mL/min (ref >60)
Glucose, Bld: 109 mg/dL — ABNORMAL HIGH (ref 65–99)
Potassium: 4.3 mmol/L (ref 3.5–5.1)
Sodium: 134 mmol/L — ABNORMAL LOW (ref 135–145)

## 2017-03-06 LAB — FERRITIN: Ferritin: 31 ng/mL (ref 11–307)

## 2017-03-06 MED ORDER — CYANOCOBALAMIN 1000 MCG/ML IJ SOLN
1000.0000 ug | Freq: Once | INTRAMUSCULAR | Status: AC
  Administered 2017-03-06: 1000 ug via INTRAMUSCULAR
  Filled 2017-03-06: qty 1

## 2017-03-06 NOTE — Telephone Encounter (Signed)
-----   Message from Lequita Asal, MD sent at 03/06/2017 10:14 AM EDT ----- Regarding: Please call patient  Ferritin still low (30).  Continue oral iron BID with OJ or vitamin C.  M  ----- Message ----- From: Interface, Lab In [place] Sent: 03/06/2017   9:12 AM To: Lequita Asal, MD

## 2017-03-06 NOTE — Progress Notes (Signed)
[facility] day:  03/06/17  Chief Complaint: Sydney Mcdaniel is a 83 y.o. female with chronic lymphocytic leukemia, B12 deficiency, and anemia who is seen for 3 month assessment.  HPI:  The patient was last seen in the hematology clinic on 11/27/2016.  At that time, she denied any B symptoms.  Exam revealed no adenopathy or hepatosplenomegaly.  Hematocrit is 30.3 and hemoglobin 10.5.  Ferritin was 29.  Sed rate was 31.  She received B12.  She has continued monthly B12 (07/02 and 07/30).  During the interm, patient has been doing "good".  She denies fevers, sweats, and weight loss.  Eating well; up 2 pounds since last visit.  Patient continues Xarelto for atrial fibrillation. She denies bleeding and areas of unexplained bruising. Patient was taking ASA daily and experiencing hemoptysis. Patient has been seen by  PCP, cardiology, and pulmonology since here last.  She was taken off of ASA and the hemoptysis has resolved. Patient is taking Iron x 2 tabs with vitamin C BID.    Past Medical History:  Diagnosis Date  . Allergy   . Anemia   . Cataract   . Glaucoma   . H/O: hysterectomy    Total  . Hyperlipidemia   . Hypertension   . Hypothyroidism   . Impaired fasting glucose   . Lichen sclerosus   . Osteoporosis    Hips  . Syncope    a. 03/2016 Echo: EF55-60%, no rwma, mild AI/MR, nl PASP; b. 03/2016 48h Holter: no significant arrhythmias/pauses; c. 03/2016 MV: mild apical defect, likely breast attenuation, nl EF, low risk.    Past Surgical History:  Procedure Laterality Date  . APPENDECTOMY    . EYE SURGERY     Glaucoma  . TOTAL ABDOMINAL HYSTERECTOMY      Family History  Problem Relation Age of Onset  . Heart attack Mother   . Glaucoma Mother   . Heart attack Father   . Parkinson's disease Brother   . Heart attack Brother     Social History:  reports that she has never smoked. She has never used smokeless tobacco. She reports  that she does not drink alcohol or use drugs.  Her husband died in 2015/01/10.  She lives in [place].  She is accompanied by her daughter, Sydney Mcdaniel.  Allergies:  Allergies  Allergen Reactions  . Alphagan [Brimonidine] Itching and Rash  . Pravachol [Pravastatin Sodium] Itching and Rash    Current Medications: Current Outpatient Prescriptions  Medication Sig Dispense Refill  . ACCU-CHEK SOFTCLIX LANCETS lancets Use as instructed; check once a day; H40.1131 100 each 12  . amLODipine (NORVASC) 2.5 MG tablet Take 1 tablet (2.5 mg total) by mouth daily. 90 tablet 3  . atorvastatin (LIPITOR) 20 MG tablet Take 1 tablet (20 mg total) by mouth at bedtime. 90 tablet 3  . Calcium Carb-Cholecalciferol (CALCIUM 600 + D PO) Take by mouth daily.    . cholecalciferol (VITAMIN D) 1000 units tablet Take 1,000 Units by mouth daily.    Marland Kitchen glucose blood (ACCU-CHEK AVIVA PLUS) test strip Use as instructed, check once a day; H40.1131 100 each 3  . ipratropium (ATROVENT) 0.03 % nasal spray Place 2 sprays into both nostrils 3 (three) times daily. 90 mL 3  . IRON PO Take 1 tablet by mouth daily.    Marland Kitchen latanoprost (XALATAN) 0.005 % ophthalmic solution Place 1 drop into the right eye at bedtime.     Marland Kitchen levothyroxine (  SYNTHROID, LEVOTHROID) 59 MCG tablet TAKE ONE TABLET BY MOUTH ON MONDAY, WEDNESDAY AND FRIDAY. 40 tablet 2  . levothyroxine (SYNTHROID, LEVOTHROID) 88 MCG tablet TAKE ONE TABLET BY MOUTH ON TUESDAY, THURSDAY, SATURDAY, AND SUNDAY 52 tablet 2  . lisinopril (PRINIVIL,ZESTRIL) 5 MG tablet Take 1 tablet (5 mg total) by mouth daily. 90 tablet 3  . Multiple Vitamins-Minerals (MULTIVITAMIN ADULT PO) Take by mouth.    . Omega-3 Fatty Acids (FISH OIL) 1200 MG CAPS Take by mouth daily.    . rivaroxaban (XARELTO) 20 MG TABS tablet Take 1 tablet (20 mg total) by mouth daily with supper. 30 tablet 6  . timolol (TIMOPTIC) 0.5 % ophthalmic solution Place 1 drop into the right eye 2 (two) times daily.     . ranitidine (ZANTAC)  150 MG tablet Take 1 tablet (150 mg total) by mouth 2 (two) times daily. (Patient not taking: Reported on 03/06/2017) 60 tablet 6   No current facility-administered medications for this visit.     Review of Systems:  GENERAL:  Feels "ok".  No fevers or sweats.  Weight up 2 pounds. PERFORMANCE STATUS (ECOG):  1 HEENT:  Glaucoma.  No visual changes, runny nose, sore throat, mouth sores or tenderness. Lungs: No shortness of breath.  Cough at night.  h/o hemoptysis (see HPI). Cardiac:  Atrial fibrillation.  No chest pain, palpitations, orthopnea, or PND.  BP meds titrated. GI:  Burps a lot.  No nausea, vomiting, diarrhea, constipation, melena or hematochezia.  Colonoscopy > 10 years ago. GU:  No urgency, frequency, dysuria, or hematuria. Up 2-3 times at night.  Drinking Gatorade. Musculoskeletal:  No back pain.  No joint pain.  No muscle tenderness. Extremities:  No pain or swelling. Skin:  No rashes or skin changes. Neuro:  No headache, numbness or weakness, balance or coordination issues. Endocrine:  No diabetes.  On Synthroid for thyroid disease.  No hot flashes or night sweats. Psych:  No mood changes, depression or anxiety. Pain:  No focal pain. Review of systems:  All other systems reviewed and found to be negative.  Physical Exam: Blood pressure (!) 163/64, pulse (!) 57, temperature 97.7 F (36.5 C), resp. rate 18, weight 133 lb 7 oz (60.5 kg). GENERAL:  Well developed, well nourished, woman sitting comfortably in the exam room in no acute distress.  She has a cane at her side. MENTAL STATUS:  Alert and oriented to person, place and time. HEAD:  Curly dark blonde hair.  Normocephalic, atraumatic, face symmetric, no Cushingoid features. EYES:  Glasses.  Blue eyes.  Pupils equal round and reactive to light and accomodation.  No conjunctivitis or scleral icterus. ENT:  Oropharynx clear without lesion.  Tongue normal. Mucous membranes moist.  RESPIRATORY:  Clear to auscultation without  rales, wheezes or rhonchi. CARDIOVASCULAR: Regular rate and rhythm without murmur, rub or gallop. ABDOMEN:  Soft, non-tender, with active bowel sounds, and no hepatosplenomegaly.  No masses. SKIN:  No rashes, ulcers or lesions. EXTREMITIES: Trace lower extremity edema.  No skin discoloration or tenderness.  No palpable cords. LYMPH NODES: No palpable cervical, supraclavicular, axillary or inguinal adenopathy  NEUROLOGICAL: Unremarkable. PSYCH:  Appropriate.   Appointment on 03/06/2017  Component Date Value Ref Range Status  . WBC 03/06/2017 6.3  3.6 - 11.0 K/uL Final  . RBC 03/06/2017 3.52* 3.80 - 5.20 MIL/uL Final  . Hemoglobin 03/06/2017 10.9* 12.0 - 16.0 g/dL Final  . HCT 03/06/2017 31.5* 35.0 - 47.0 % Final  . MCV 03/06/2017 89.4  80.0 -  100.0 fL Final  . MCH 03/06/2017 31.0  26.0 - 34.0 pg Final  . MCHC 03/06/2017 34.7  32.0 - 36.0 g/dL Final  . RDW 03/06/2017 15.7* 11.5 - 14.5 % Final  . Platelets 03/06/2017 196  150 - 440 K/uL Final  . Neutrophils Relative % 03/06/2017 38  % Final  . Neutro Abs 03/06/2017 2.4  1.4 - 6.5 K/uL Final  . Lymphocytes Relative 03/06/2017 51  % Final  . Lymphs Abs 03/06/2017 3.2  1.0 - 3.6 K/uL Final  . Monocytes Relative 03/06/2017 8  % Final  . Monocytes Absolute 03/06/2017 0.5  0.2 - 0.9 K/uL Final  . Eosinophils Relative 03/06/2017 2  % Final  . Eosinophils Absolute 03/06/2017 0.2  0 - 0.7 K/uL Final  . Basophils Relative 03/06/2017 1  % Final  . Basophils Absolute 03/06/2017 0.0  0 - 0.1 K/uL Final  . Sodium 03/06/2017 134* 135 - 145 mmol/L Final  . Potassium 03/06/2017 4.3  3.5 - 5.1 mmol/L Final  . Chloride 03/06/2017 101  101 - 111 mmol/L Final  . CO2 03/06/2017 26  22 - 32 mmol/L Final  . Glucose, Bld 03/06/2017 109* 65 - 99 mg/dL Final  . BUN 03/06/2017 13  6 - 20 mg/dL Final  . Creatinine, Ser 03/06/2017 0.73  0.44 - 1.00 mg/dL Final  . Calcium 03/06/2017 9.2  8.9 - 10.3 mg/dL Final  . GFR calc non Af Amer 03/06/2017 >60  >60 mL/min  Final  . GFR calc Af Amer 03/06/2017 >60  >60 mL/min Final   Comment: (NOTE) The eGFR has been calculated using the CKD EPI equation. This calculation has not been validated in all clinical situations. eGFR's persistently <60 mL/min signify possible Chronic Kidney Disease.   . Anion gap 03/06/2017 7  5 - 15 Final  . Retic Ct Pct 03/06/2017 PENDING  0.4 - 3.1 % Incomplete  . RBC. 03/06/2017 PENDING  3.87 - 5.11 MIL/uL Incomplete  . Retic Count, Absolute 03/06/2017 PENDING  19.0 - 186.0 K/uL Incomplete    Assessment:  AUNNA SNOOKS is a 83 y.o. female with stage 0 CLL.  She presented with a distant history of anemia and recent lymphocytosis.  She was noted to have mild lymphocystosis in 06/22/2015.  Absolute lymphocyte count (ALC) has ranged between 4700 - 5500.  Work-up on 01/10/2016 revealed a hematocrit of 31.6, hemoglobin 11.4, MCV 89, platelets 203,000, WBC 11,00 with an ANC of 3700.  Absolute lymphocyte count was 6400.  Ferritin was 116.  B12 was 119 (low).  Folate was 44.0.  Retic was 0.9% (low).  Uric acid was 4.5.  Guaiac cards were negative x 3 in 12/2015.  She was diagnosed with B12 deficiency.  She receives B12 monthly (last 01/22/2017).  Folate was 38 on 08/07/2016.  Peripheral blood flow cytometry revealed involvement by a CD5+, CD23+, CD38- monoclonal B cell population with lambda light chain restriction consistent with CLL/SLL.  There was a small population (8% of the T cells) of double positive (CD4+/CD8+) T cells (described in association with chronic viral infections, autoimmune disorders, chronic inflammatory disorders, and immunodeficiency states).   She has a slight progressive normocytic anemia.  Work-up on 08/07/2016 revealed the following normal labs: creatinine, LDH, uric acid, ferritin (105), iron studies (12% sat; TIBC 295), and folate.  Retic was 1.1% (low for level of anemia).  Coombs was negative on 09/04/2016.  Diet is modest.  She denies any melena or  hematochezia.  Guaiac cards were negative x 3  in 07/2016.  Last colonoscopy was > 10 years ago.  She was diagnosed with atrial fibrillation after presenting with syncopal episodes.  She is on Xarelto.  Chest CT on 11/07/2016 revealed two 2 mm right lung nodules (right major fissure and lateral aspect RUL).  Non-contrast chest CT can be considered in 12 months if patient is high-risk.   There was a 5 mm right lower lobe ground-glass nodule.  She has an appointment with pulmonology.  Symptomatically, she denies any B symptoms.  Exam reveals no palpable adenopathy or hepatosplenomegaly.  Hematocrit is 31.5 and hemoglobin 10.9.  Plan: 1.  Labs today:  CBC with diff, BMP, ferritin, retic. 2.  Anticipate non-contrast CT chest in May 2019 to follow up on pulmonary nodules.  3.  Discuss continued observation without intervention for CLL. 4.  B12 today and monthly x 3 for B12. 5.  RTC in 4 months for MD assessment, labs (CBC with diff, BMP, ferritin) + B12.   Honor Loh, NP  03/06/2017, 9:34 AM   I saw and evaluated the patient, participating in the key portions of the service and reviewing pertinent diagnostic studies and records.  I reviewed the nurse practitioner's note and agree with the findings and the plan.  The assessment and plan were discussed with the patient.  A few questions were asked by the patient and answered.   Lequita Asal, MD 03/06/2017

## 2017-03-06 NOTE — Telephone Encounter (Signed)
Called patient's daughter, [name] and informed her that patient's ferritin is still low @ 30.  MD recommends she continue iron BID with OJ or vitamin C.  She states she does take the iron with vitamin C.

## 2017-03-06 NOTE — Progress Notes (Signed)
Patient offers no complaints today. 

## 2017-03-12 ENCOUNTER — Encounter: Payer: Self-pay | Admitting: Hematology and Oncology

## 2017-03-24 DIAGNOSIS — Z23 Encounter for immunization: Secondary | ICD-10-CM | POA: Diagnosis not present

## 2017-04-09 ENCOUNTER — Inpatient Hospital Stay: Payer: Medicare Other | Attending: Hematology and Oncology

## 2017-04-09 DIAGNOSIS — Z79899 Other long term (current) drug therapy: Secondary | ICD-10-CM | POA: Diagnosis not present

## 2017-04-09 DIAGNOSIS — I4891 Unspecified atrial fibrillation: Secondary | ICD-10-CM | POA: Diagnosis not present

## 2017-04-09 DIAGNOSIS — Z7901 Long term (current) use of anticoagulants: Secondary | ICD-10-CM | POA: Diagnosis not present

## 2017-04-09 DIAGNOSIS — D649 Anemia, unspecified: Secondary | ICD-10-CM | POA: Diagnosis not present

## 2017-04-09 DIAGNOSIS — E785 Hyperlipidemia, unspecified: Secondary | ICD-10-CM | POA: Diagnosis not present

## 2017-04-09 DIAGNOSIS — C9110 Chronic lymphocytic leukemia of B-cell type not having achieved remission: Secondary | ICD-10-CM | POA: Diagnosis not present

## 2017-04-09 DIAGNOSIS — E538 Deficiency of other specified B group vitamins: Secondary | ICD-10-CM | POA: Diagnosis not present

## 2017-04-09 DIAGNOSIS — R918 Other nonspecific abnormal finding of lung field: Secondary | ICD-10-CM | POA: Insufficient documentation

## 2017-04-09 DIAGNOSIS — Z7982 Long term (current) use of aspirin: Secondary | ICD-10-CM | POA: Diagnosis not present

## 2017-04-09 DIAGNOSIS — I10 Essential (primary) hypertension: Secondary | ICD-10-CM | POA: Diagnosis not present

## 2017-04-09 DIAGNOSIS — Z90710 Acquired absence of both cervix and uterus: Secondary | ICD-10-CM | POA: Insufficient documentation

## 2017-04-09 DIAGNOSIS — E039 Hypothyroidism, unspecified: Secondary | ICD-10-CM | POA: Diagnosis not present

## 2017-04-09 MED ORDER — CYANOCOBALAMIN 1000 MCG/ML IJ SOLN
1000.0000 ug | Freq: Once | INTRAMUSCULAR | Status: AC
  Administered 2017-04-09: 1000 ug via INTRAMUSCULAR
  Filled 2017-04-09: qty 1

## 2017-04-18 ENCOUNTER — Encounter: Payer: Self-pay | Admitting: Internal Medicine

## 2017-04-18 ENCOUNTER — Ambulatory Visit (INDEPENDENT_AMBULATORY_CARE_PROVIDER_SITE_OTHER): Payer: Medicare Other | Admitting: Internal Medicine

## 2017-04-18 VITALS — BP 178/62 | HR 57 | Ht 62.0 in | Wt 134.5 lb

## 2017-04-18 DIAGNOSIS — R42 Dizziness and giddiness: Secondary | ICD-10-CM | POA: Insufficient documentation

## 2017-04-18 DIAGNOSIS — I5030 Unspecified diastolic (congestive) heart failure: Secondary | ICD-10-CM

## 2017-04-18 DIAGNOSIS — I10 Essential (primary) hypertension: Secondary | ICD-10-CM | POA: Diagnosis not present

## 2017-04-18 DIAGNOSIS — R55 Syncope and collapse: Secondary | ICD-10-CM | POA: Diagnosis not present

## 2017-04-18 DIAGNOSIS — I480 Paroxysmal atrial fibrillation: Secondary | ICD-10-CM | POA: Diagnosis not present

## 2017-04-18 MED ORDER — LISINOPRIL 5 MG PO TABS: ORAL_TABLET | ORAL | 3 refills | Status: DC

## 2017-04-18 MED ORDER — RIVAROXABAN 20 MG PO TABS: 20.0000 mg | ORAL_TABLET | Freq: Every day | ORAL | 6 refills | Status: DC

## 2017-04-18 NOTE — Patient Instructions (Signed)
Medication Instructions:  Your physician has recommended you make the following change in your medication:  1- TAKE Lisinopril 5 mg (1 tablet) by mouth daily for systolic BP (top number) >161. Take Lisinopril 2.5 mg (1/2 tablet) by mouth daily for systolic BP > 096.    Labwork: none  Testing/Procedures: none  Follow-Up: Your physician wants you to follow-up in: 6 MONTHS WITH DR END. You will receive a reminder letter in the mail two months in advance. If you don't receive a letter, please call our office to schedule the follow-up appointment.   If you need a refill on your cardiac medications before your next appointment, please call your pharmacy.

## 2017-04-18 NOTE — Progress Notes (Signed)
Follow-up Outpatient Visit Date: 04/18/2017  Primary Care Provider: Arnetha Courser, MD [address] [place] [address]  Chief Complaint: Follow-up lightheadedness  HPI:  Sydney Mcdaniel is a 83 y.o. year-old female with history of recurrent falls thought to be due to orthostatic hypotension and vasovagal syncope, paroxysmal atrial fibrillation, heart failure with preserved ejection fraction, hypertension, chronic lymphocytic leukemia, B12 deficiency, and anemia who presents for follow-up of recurrent syncope.  She was previously followed in our office by Dr. Yvone Mcdaniel, having last been seen in April.  Today, Sydney Mcdaniel reports that she has been doing relatively well.  She has not had any further episodes of syncope, lightheadedness, or falls since early April.  She notes some fatigue at the Sydney Mcdaniel of the day, but is able to carry out her normal activities.  She reports only one episode during which she felt "a little funny" that resolved spontaneously within a few minutes.  She denies chest pain, palpitations, and shortness of breath.  Intermittent dependent edema is stable, as is her weight.  She continues to check her blood pressure at home on a daily basis.  She takes amlodipine 2.5 mg daily around lunchtime.  If her morning blood pressure is greater than 140, she will also take lisinopril 2.5 mg in the morning.  She remains on rivaroxaban without significant bleeding.  --------------------------------------------------------------------------------------------------  Cardiovascular History & Procedures: Cardiovascular Problems:  Recurrent syncope  Paroxysmal atrial fibrillation  HFpEF  Risk Factors:  Hypertension and age greater than 97  Cath/PCI:  None  CV Surgery:  None  EP Procedures and Devices:  Event monitor (06/15/16): Dominantly sinus rhythm with paroxysmal atrial fibrillation (4% burden).  No significant pauses.  Event monitor (06/02/16): Normal sinus  rhythm with first-degree AV block.  No significant arrhythmia.  Holter monitor (04/17/16): Predominantly sinus rhythm with first-degree AV block.  PACs and short atrial runs lasting up to 4 beats noted.  Rare PVCs.  Non-Invasive Evaluation(s):  TTE (04/17/16): Normal LV size.  LVEF 55-60%.  Normal wall motion and diastolic function.  Mild aortic and mitral regurgitation.  Normal RV size and function.  Normal pulmonary artery pressure.  Pharmacologic MPI (04/10/16): Low risk study with small fixed defect at the apex, likely due to breast attenuation.  No ischemia.  LVEF 55-65%.  Recent CV Pertinent Labs: Lab Results  Component Value Date   CHOL 128 12/21/2015   CHOL 141 08/24/2015   HDL 62 12/21/2015   HDL 52 08/24/2015   LDLCALC 44 12/21/2015   LDLCALC 64 08/24/2015   TRIG 111 12/21/2015   CHOLHDL 2.1 12/21/2015   K 4.3 03/06/2017   MG 2.2 12/21/2015   BUN 13 03/06/2017   BUN 9 07/19/2016   CREATININE 0.73 03/06/2017   CREATININE 0.83 04/24/2016    Past medical and surgical history were reviewed and updated in EPIC.  Current Meds  Medication Sig  . ACCU-CHEK SOFTCLIX LANCETS lancets Use as instructed; check once a day; H40.1131  . amLODipine (NORVASC) 2.5 MG tablet Take 1 tablet (2.5 mg total) by mouth daily.  Marland Kitchen atorvastatin (LIPITOR) 20 MG tablet Take 1 tablet (20 mg total) by mouth at bedtime.  . Calcium Carb-Cholecalciferol (CALCIUM 600 + D PO) Take by mouth daily.  . cholecalciferol (VITAMIN D) 1000 units tablet Take 1,000 Units by mouth daily.  . Cyanocobalamin (B-12) 1000 MCG/ML KIT Inject 1,000 mcg as directed every 30 (thirty) days.  Marland Kitchen glucose blood (ACCU-CHEK AVIVA PLUS) test strip Use as instructed, check once  a day; H40.1131  . ipratropium (ATROVENT) 0.03 % nasal spray Place 2 sprays into both nostrils 3 (three) times daily.  . IRON PO Take 1 tablet by mouth daily.  Marland Kitchen latanoprost (XALATAN) 0.005 % ophthalmic solution Place 1 drop into the right eye at bedtime.     Marland Kitchen levothyroxine (SYNTHROID, LEVOTHROID) 75 MCG tablet TAKE ONE TABLET BY MOUTH ON MONDAY, WEDNESDAY AND FRIDAY.  Marland Kitchen levothyroxine (SYNTHROID, LEVOTHROID) 88 MCG tablet TAKE ONE TABLET BY MOUTH ON TUESDAY, THURSDAY, SATURDAY, AND SUNDAY  . lisinopril (PRINIVIL,ZESTRIL) 5 MG tablet Take 5 mg (1 tablet) by mouth daily if Systolic BP>160. Take 2.5 mg (1/2 tablet) by mouth daily is Systolic BP> 140.  . Multiple Vitamins-Minerals (MULTIVITAMIN ADULT PO) Take by mouth.  . Omega-3 Fatty Acids (FISH OIL) 1200 MG CAPS Take by mouth daily.  . ranitidine (ZANTAC) 150 MG tablet Take 1 tablet (150 mg total) by mouth 2 (two) times daily.  . rivaroxaban (XARELTO) 20 MG TABS tablet Take 1 tablet (20 mg total) by mouth daily with supper.  . timolol (TIMOPTIC) 0.5 % ophthalmic solution Place 1 drop into the right eye 2 (two) times daily.   . [DISCONTINUED] lisinopril (PRINIVIL,ZESTRIL) 5 MG tablet Take 1 tablet (5 mg total) by mouth daily.  . [DISCONTINUED] rivaroxaban (XARELTO) 20 MG TABS tablet Take 1 tablet (20 mg total) by mouth daily with supper.    Allergies: Alphagan [brimonidine] and Pravachol [pravastatin sodium]  Social History   Social History  . Marital status: Widowed    Spouse name: N/A  . Number of children: N/A  . Years of education: N/A   Occupational History  . Not on file.   Social History Main Topics  . Smoking status: Never Smoker  . Smokeless tobacco: Never Used  . Alcohol use No  . Drug use: No  . Sexual activity: No   Other Topics Concern  . Not on file   Social History Narrative  . No narrative on file    Family History  Problem Relation Age of Onset  . Heart attack Mother   . Glaucoma Mother   . Heart attack Father   . Parkinson's disease Brother   . Heart attack Brother     Review of Systems: A 12-system review of systems was performed and was negative except as noted in the  HPI.  --------------------------------------------------------------------------------------------------  Physical Exam: BP (!) 178/62 (BP Location: Left Arm, Patient Position: Sitting, Cuff Size: Normal)   Pulse (!) 57   Ht 5\' 2"  (1.575 m)   Wt 134 lb 8 oz (61 kg)   LMP  (LMP Unknown)   BMI 24.60 kg/m   General: Well-developed, well-nourished elderly woman, seated comfortably in the exam room.  She is accompanied by her daughter. HEENT: No conjunctival pallor or scleral icterus. Moist mucous membranes.  OP clear. Neck: Supple without lymphadenopathy, thyromegaly, JVD, or HJR. Lungs: Normal work of breathing. Clear to auscultation bilaterally without wheezes or crackles. Heart: Bradycardic but regular without murmurs, rubs, or gallops. Non-displaced PMI. Abd: Bowel sounds present. Soft, NT/ND without hepatosplenomegaly Ext: No lower extremity edema. Radial, PT, and DP pulses are 2+ bilaterally. Skin: Warm and dry without rash.  EKG: Sinus bradycardia (heart rate 57 bpm) with first-degree AV block and incomplete left bundle branch block.  No significant change since prior tracing on 10/17/16.  Lab Results  Component Value Date   WBC 6.3 03/06/2017   HGB 10.9 (L) 03/06/2017   HCT 31.5 (L) 03/06/2017   MCV 89.4  03/06/2017   PLT 196 03/06/2017    Lab Results  Component Value Date   NA 134 (L) 03/06/2017   K 4.3 03/06/2017   CL 101 03/06/2017   CO2 26 03/06/2017   BUN 13 03/06/2017   CREATININE 0.73 03/06/2017   GLUCOSE 109 (H) 03/06/2017   ALT 18 04/21/2016    Lab Results  Component Value Date   CHOL 128 12/21/2015   HDL 62 12/21/2015   LDLCALC 44 12/21/2015   TRIG 111 12/21/2015   CHOLHDL 2.1 12/21/2015   --------------------------------------------------------------------------------------------------  ASSESSMENT AND PLAN: Syncope and orthostatic lightheadedness No recurrent symptoms since Sydney Mcdaniel's last visit with Dr. Yvone Mcdaniel in April.  Her blood pressure  regimen currently consists of amlodipine 2.5 mg daily and lisinopril 2.5 mg daily as needed for systolic blood pressures greater than 140 mmHg.  Home blood pressure readings are generally well controlled, though her blood pressure is elevated today (see details below).  We will not pursue any further workup at this time.  I encouraged Sydney Mcdaniel to stay well-hydrated.  Hypertension Blood pressure is moderately elevated today.  Sydney Mcdaniel reports taking lisinopril 2.5 mg this morning.  I recommended that she take lisinopril 2.5 mg daily if her systolic blood pressures greater than 140 mmHg and 5 mg daily if her systolic blood pressure is greater than 160 mmHg.  She should continue with amlodipine 2.5 mg daily.  Given history of orthostatic hypotension and syncope, we will tolerate a degree of permissive hypertension.  Paroxysmal atrial fibrillation No symptoms to suggest recurrent atrial fibrillation.  Heart rate today is in the upper 50's without any rate control agents (other than ophthalmic timolol).  We will continue with anticoagulation using rivaroxaban, given a CHADSVASC score of at least 68 (age x2, female, hypertension).  HFpEF Sydney Mcdaniel appears euvolemic and well compensated today with NYHA class II symptoms.  We will not make any medication changes at this time other than adjustment of her as needed lisinopril (see details above).  No standing furosemide at this time.  Follow-up: Return to clinic in 6 months.  Nelva Bush, MD 04/18/2017 10:48 AM

## 2017-04-23 ENCOUNTER — Encounter (INDEPENDENT_AMBULATORY_CARE_PROVIDER_SITE_OTHER): Payer: Self-pay | Admitting: Vascular Surgery

## 2017-04-30 ENCOUNTER — Ambulatory Visit (INDEPENDENT_AMBULATORY_CARE_PROVIDER_SITE_OTHER): Payer: PRIVATE HEALTH INSURANCE | Admitting: Vascular Surgery

## 2017-04-30 ENCOUNTER — Ambulatory Visit (INDEPENDENT_AMBULATORY_CARE_PROVIDER_SITE_OTHER): Payer: Medicare Other

## 2017-04-30 DIAGNOSIS — I6523 Occlusion and stenosis of bilateral carotid arteries: Secondary | ICD-10-CM

## 2017-05-08 ENCOUNTER — Inpatient Hospital Stay: Payer: Medicare Other | Attending: Hematology and Oncology

## 2017-05-08 DIAGNOSIS — E785 Hyperlipidemia, unspecified: Secondary | ICD-10-CM | POA: Insufficient documentation

## 2017-05-08 DIAGNOSIS — Z79899 Other long term (current) drug therapy: Secondary | ICD-10-CM | POA: Insufficient documentation

## 2017-05-08 DIAGNOSIS — I10 Essential (primary) hypertension: Secondary | ICD-10-CM | POA: Insufficient documentation

## 2017-05-08 DIAGNOSIS — D649 Anemia, unspecified: Secondary | ICD-10-CM | POA: Diagnosis not present

## 2017-05-08 DIAGNOSIS — Z7901 Long term (current) use of anticoagulants: Secondary | ICD-10-CM | POA: Insufficient documentation

## 2017-05-08 DIAGNOSIS — E538 Deficiency of other specified B group vitamins: Secondary | ICD-10-CM | POA: Insufficient documentation

## 2017-05-08 DIAGNOSIS — Z7982 Long term (current) use of aspirin: Secondary | ICD-10-CM | POA: Diagnosis not present

## 2017-05-08 DIAGNOSIS — C9110 Chronic lymphocytic leukemia of B-cell type not having achieved remission: Secondary | ICD-10-CM | POA: Diagnosis not present

## 2017-05-08 DIAGNOSIS — I4891 Unspecified atrial fibrillation: Secondary | ICD-10-CM | POA: Diagnosis not present

## 2017-05-08 DIAGNOSIS — E039 Hypothyroidism, unspecified: Secondary | ICD-10-CM | POA: Insufficient documentation

## 2017-05-08 DIAGNOSIS — Z90710 Acquired absence of both cervix and uterus: Secondary | ICD-10-CM | POA: Insufficient documentation

## 2017-05-08 DIAGNOSIS — R918 Other nonspecific abnormal finding of lung field: Secondary | ICD-10-CM | POA: Diagnosis not present

## 2017-05-08 MED ORDER — CYANOCOBALAMIN 1000 MCG/ML IJ SOLN
1000.0000 ug | Freq: Once | INTRAMUSCULAR | Status: AC
  Administered 2017-05-08: 1000 ug via INTRAMUSCULAR

## 2017-05-09 DIAGNOSIS — H401131 Primary open-angle glaucoma, bilateral, mild stage: Secondary | ICD-10-CM | POA: Diagnosis not present

## 2017-05-09 DIAGNOSIS — E119 Type 2 diabetes mellitus without complications: Secondary | ICD-10-CM | POA: Diagnosis not present

## 2017-05-14 ENCOUNTER — Encounter (INDEPENDENT_AMBULATORY_CARE_PROVIDER_SITE_OTHER): Payer: Self-pay

## 2017-06-05 ENCOUNTER — Inpatient Hospital Stay: Payer: Medicare Other

## 2017-06-06 ENCOUNTER — Inpatient Hospital Stay: Payer: Medicare Other | Attending: Hematology and Oncology

## 2017-06-06 DIAGNOSIS — E785 Hyperlipidemia, unspecified: Secondary | ICD-10-CM | POA: Diagnosis not present

## 2017-06-06 DIAGNOSIS — Z7901 Long term (current) use of anticoagulants: Secondary | ICD-10-CM | POA: Insufficient documentation

## 2017-06-06 DIAGNOSIS — I4891 Unspecified atrial fibrillation: Secondary | ICD-10-CM | POA: Diagnosis not present

## 2017-06-06 DIAGNOSIS — Z7982 Long term (current) use of aspirin: Secondary | ICD-10-CM | POA: Diagnosis not present

## 2017-06-06 DIAGNOSIS — I10 Essential (primary) hypertension: Secondary | ICD-10-CM | POA: Insufficient documentation

## 2017-06-06 DIAGNOSIS — R918 Other nonspecific abnormal finding of lung field: Secondary | ICD-10-CM | POA: Diagnosis not present

## 2017-06-06 DIAGNOSIS — E039 Hypothyroidism, unspecified: Secondary | ICD-10-CM | POA: Diagnosis not present

## 2017-06-06 DIAGNOSIS — C9110 Chronic lymphocytic leukemia of B-cell type not having achieved remission: Secondary | ICD-10-CM | POA: Insufficient documentation

## 2017-06-06 DIAGNOSIS — D649 Anemia, unspecified: Secondary | ICD-10-CM | POA: Insufficient documentation

## 2017-06-06 DIAGNOSIS — Z90710 Acquired absence of both cervix and uterus: Secondary | ICD-10-CM | POA: Insufficient documentation

## 2017-06-06 DIAGNOSIS — Z79899 Other long term (current) drug therapy: Secondary | ICD-10-CM | POA: Diagnosis not present

## 2017-06-06 DIAGNOSIS — E538 Deficiency of other specified B group vitamins: Secondary | ICD-10-CM | POA: Insufficient documentation

## 2017-06-06 MED ORDER — CYANOCOBALAMIN 1000 MCG/ML IJ SOLN
1000.0000 ug | Freq: Once | INTRAMUSCULAR | Status: AC
  Administered 2017-06-06: 1000 ug via INTRAMUSCULAR

## 2017-06-07 ENCOUNTER — Other Ambulatory Visit: Payer: Self-pay | Admitting: Family Medicine

## 2017-06-27 ENCOUNTER — Encounter: Payer: Medicare Other | Admitting: Family Medicine

## 2017-06-27 ENCOUNTER — Ambulatory Visit: Payer: PRIVATE HEALTH INSURANCE

## 2017-06-27 ENCOUNTER — Ambulatory Visit: Payer: Medicare Other | Admitting: Family Medicine

## 2017-06-27 ENCOUNTER — Encounter: Payer: Self-pay | Admitting: Family Medicine

## 2017-06-27 ENCOUNTER — Ambulatory Visit (INDEPENDENT_AMBULATORY_CARE_PROVIDER_SITE_OTHER): Payer: Medicare Other | Admitting: Family Medicine

## 2017-06-27 VITALS — BP 126/80 | HR 71 | Temp 97.4°F | Ht 59.75 in | Wt 132.4 lb

## 2017-06-27 DIAGNOSIS — R55 Syncope and collapse: Secondary | ICD-10-CM

## 2017-06-27 DIAGNOSIS — M654 Radial styloid tenosynovitis [de Quervain]: Secondary | ICD-10-CM

## 2017-06-27 DIAGNOSIS — Z0000 Encounter for general adult medical examination without abnormal findings: Secondary | ICD-10-CM

## 2017-06-27 DIAGNOSIS — Z780 Asymptomatic menopausal state: Secondary | ICD-10-CM

## 2017-06-27 DIAGNOSIS — I10 Essential (primary) hypertension: Secondary | ICD-10-CM | POA: Diagnosis not present

## 2017-06-27 DIAGNOSIS — Z0001 Encounter for general adult medical examination with abnormal findings: Secondary | ICD-10-CM | POA: Diagnosis not present

## 2017-06-27 DIAGNOSIS — Z23 Encounter for immunization: Secondary | ICD-10-CM

## 2017-06-27 DIAGNOSIS — Z7189 Other specified counseling: Secondary | ICD-10-CM

## 2017-06-27 DIAGNOSIS — R413 Other amnesia: Secondary | ICD-10-CM

## 2017-06-27 MED ORDER — TETANUS-DIPHTH-ACELL PERTUSSIS 5-2.5-18.5 LF-MCG/0.5 IM SUSP
0.5000 mL | Freq: Once | INTRAMUSCULAR | Status: AC
  Administered 2017-06-27: 0.5 mL via INTRAMUSCULAR

## 2017-06-27 NOTE — Assessment & Plan Note (Signed)
No episodes since April 2018

## 2017-06-27 NOTE — Progress Notes (Signed)
Patient: Sydney Mcdaniel, Female    DOB: December 06, 1924, 84 y.o.   MRN: [id number]  Visit Date: 06/27/2017  Today's Provider: Enid Derry, MD   Chief Complaint  Patient presents with  . Medicare Wellness  . Wrist Pain    RT wrist pain, has been taking tylenol, and aspercreame, using wrist brace   . Immunizations    discuss tdap     Subjective:   Sydney Mcdaniel is a 84 y.o. female who presents today for her Subsequent Annual Wellness Visit.  She has questions about the tetanus booster; around great-grandchildren Has pain in the RIGHT wrist; using aspercreme and brace and tylenol Has not had an episode since April 2018; cardiologist did not limit driving She asked about cheaper blood thinners; the Xarelto is expensive; prescribed by cardiologist; she will talk to that specialist; once they took her off of the aspirin, the hemoptysis resolved She has CLL which is followed by heme-onc  USPSTF grade A and B recommendations Depression:  Depression screen [facility] 2/9 06/27/2017 06/22/2016 03/22/2016 02/25/2016 12/21/2015  Decreased Interest 0 0 0 0 0  Down, Depressed, Hopeless 0 0 0 0 0  PHQ - 2 Score 0 0 0 0 0  Altered sleeping - - - - -  Tired, decreased energy - - - - -  Change in appetite - - - - -  Feeling bad or failure about yourself  - - - - -  Trouble concentrating - - - - -  Moving slowly or fidgety/restless - - - - -  Suicidal thoughts - - - - -  PHQ-9 Score - - - - -   Hypertension: BP Readings from Last 3 Encounters:  06/27/17 126/80  04/18/17 (!) 178/62  03/06/17 (!) 163/64   Obesity: Wt Readings from Last 3 Encounters:  06/27/17 132 lb 6.4 oz (60.1 kg)  04/18/17 134 lb 8 oz (61 kg)  03/06/17 133 lb 7 oz (60.5 kg)   BMI Readings from Last 3 Encounters:  06/27/17 26.07 kg/m  04/18/17 24.60 kg/m  03/06/17 24.41 kg/m    Skin cancer: nothing worrisome Lung cancer:  n/a Breast cancer: last mammogram was in 2013; not interested in getting Colorectal cancer: past  age Intimate partner violence: no abuse Osteoporosis: order Fall prevention/vitamin D: discussed Diet: good eater Exercise: walks in the house some Alcohol: no Tobacco use: never Aspirin: not taking, they stopped it when she was spitting up the blood; they couldn't find anything wrong with her lungs, then she saw the cardiologist Lipids:  Lab Results  Component Value Date   CHOL 128 12/21/2015   CHOL 141 08/24/2015   CHOL 180 06/22/2015   Lab Results  Component Value Date   HDL 62 12/21/2015   HDL 52 08/24/2015   HDL 54 06/22/2015   Lab Results  Component Value Date   LDLCALC 44 12/21/2015   LDLCALC 64 08/24/2015   [place] 93 06/22/2015   Lab Results  Component Value Date   TRIG 111 12/21/2015   TRIG 124 08/24/2015   TRIG 167 (H) 06/22/2015   Lab Results  Component Value Date   CHOLHDL 2.1 12/21/2015   No results found for: LDLDIRECT Glucose:  Glucose, Bld  Date Value Ref Range Status  03/06/2017 109 (H) 65 - 99 mg/dL Final  11/27/2016 99 65 - 99 mg/dL Final  10/02/2016 113 (H) 65 - 99 mg/dL Final    Review of Systems  Past Medical History:  Diagnosis Date  . Allergy   .  Anemia   . Cataract   . Glaucoma   . H/O: hysterectomy    Total  . Hyperlipidemia   . Hypertension   . Hypothyroidism   . Impaired fasting glucose   . Lichen sclerosus   . Osteoporosis    Hips  . Syncope    a. 03/2016 Echo: EF55-60%, no rwma, mild AI/MR, nl PASP; b. 03/2016 48h Holter: no significant arrhythmias/pauses; c. 03/2016 MV: mild apical defect, likely breast attenuation, nl EF, low risk.    Past Surgical History:  Procedure Laterality Date  . APPENDECTOMY    . EYE SURGERY     Glaucoma  . TOTAL ABDOMINAL HYSTERECTOMY      Family History  Problem Relation Age of Onset  . Heart attack Mother   . Glaucoma Mother   . Heart attack Father   . Parkinson's disease Brother   . Heart attack Brother     Social History   Tobacco Use  . Smoking status: Never Smoker   . Smokeless tobacco: Never Used  Substance Use Topics  . Alcohol use: No  . Drug use: No    Outpatient Encounter Medications as of 06/27/2017  Medication Sig Note  . amLODipine (NORVASC) 2.5 MG tablet Take 1 tablet (2.5 mg total) by mouth daily.   Marland Kitchen atorvastatin (LIPITOR) 20 MG tablet TAKE ONE TABLET BY MOUTH AT BEDTIME   . Calcium Carb-Cholecalciferol (CALCIUM 600 + D PO) Take by mouth daily.   . cholecalciferol (VITAMIN D) 1000 units tablet Take 1,000 Units by mouth daily.   . Cyanocobalamin (B-12) 1000 MCG/ML KIT Inject 1,000 mcg as directed every 30 (thirty) days.   Marland Kitchen ipratropium (ATROVENT) 0.03 % nasal spray Place 2 sprays into both nostrils 3 (three) times daily.   . IRON PO Take 1 tablet by mouth 2 (two) times daily.    Marland Kitchen latanoprost (XALATAN) 0.005 % ophthalmic solution Place 1 drop into the right eye at bedtime.    Marland Kitchen levothyroxine (SYNTHROID, LEVOTHROID) 75 MCG tablet TAKE ONE TABLET BY MOUTH ON MONDAY, WEDNESDAY AND FRIDAY.   Marland Kitchen levothyroxine (SYNTHROID, LEVOTHROID) 88 MCG tablet TAKE ONE TABLET BY MOUTH ON TUESDAY, THURSDAY, SATURDAY, AND SUNDAY   . lisinopril (PRINIVIL,ZESTRIL) 5 MG tablet Take 5 mg (1 tablet) by mouth daily if Systolic ZD>664. Take 2.5 mg (1/2 tablet) by mouth daily is Systolic BP> 403.   . Multiple Vitamins-Minerals (MULTIVITAMIN ADULT PO) Take by mouth.   . Omega-3 Fatty Acids (FISH OIL) 1200 MG CAPS Take by mouth daily.   . rivaroxaban (XARELTO) 20 MG TABS tablet Take 1 tablet (20 mg total) by mouth daily with supper.   . timolol (TIMOPTIC) 0.5 % ophthalmic solution Place 1 drop into the right eye 2 (two) times daily.    . [DISCONTINUED] aspirin 81 MG chewable tablet Chew 81 mg by mouth.   . [DISCONTINUED] calcium carbonate (CALCIUM 600) 600 MG TABS tablet Take 600 mg by mouth.   Marland Kitchen ACCU-CHEK SOFTCLIX LANCETS lancets Use as instructed; check once a day; H40.1131   . glucose blood (ACCU-CHEK AVIVA PLUS) test strip Use as instructed, check once a day; H40.1131    . [DISCONTINUED] ranitidine (ZANTAC) 150 MG tablet Take 1 tablet (150 mg total) by mouth 2 (two) times daily. (Patient not taking: Reported on 06/27/2017) 03/06/2017: PRN  . [EXPIRED] Tdap (BOOSTRIX) injection 0.5 mL     No facility-administered encounter medications on file as of 06/27/2017.     Functional Ability / Safety Screening 1.  Was the timed  Get Up and Go test less than 12 seconds?  yes 2.  Does the patient need help with the phone, transportation, shopping,      preparing meals, housework, laundry, medications, or managing money?  yes , able to drive to church and in town, but not far distances 3.  Does the patient's home have:  loose throw rugs in the hallway?   no      Grab bars in the bathroom? yes      Handrails on the stairs?   yes      Good lighting?   yes 4.  Has the patient noticed any hearing difficulties?   no  Advanced Directives Does patient have a HCPOA?    yes If yes, name and contact information: [name] B. Ronnette Hila [phone] Does patient have a living will or MOST form?  no  Immunizations: due for tetanus with pertussis; she is around great-grandchildren, wants the vaccine  Fall Risk Assessment See under rooming  Depression Screen See under rooming Depression screen [facility] 2/9 06/27/2017 06/22/2016 03/22/2016 02/25/2016 12/21/2015  Decreased Interest 0 0 0 0 0  Down, Depressed, Hopeless 0 0 0 0 0  PHQ - 2 Score 0 0 0 0 0  Altered sleeping - - - - -  Tired, decreased energy - - - - -  Change in appetite - - - - -  Feeling bad or failure about yourself  - - - - -  Trouble concentrating - - - - -  Moving slowly or fidgety/restless - - - - -  Suicidal thoughts - - - - -  PHQ-9 Score - - - - -    Objective:   Vitals: BP 126/80 (BP Location: Right Arm, Patient Position: Sitting, Cuff Size: Normal)   Pulse 71   Temp (!) 97.4 F (36.3 C) (Oral)   Ht 4' 11.75" (1.518 m)   Wt 132 lb 6.4 oz (60.1 kg)   LMP  (LMP Unknown)   SpO2 98%   BMI 26.07 kg/m  Body mass  index is 26.07 kg/m. No exam data present  Physical Exam  Constitutional: She appears well-developed and well-nourished.  Elderly female, no distress  HENT:  Head: Normocephalic and atraumatic.  Eyes: EOM are normal.  Cardiovascular: Normal rate.  Pulses:      Radial pulses are 1+ on the right side.  Pulmonary/Chest: Effort normal.  Musculoskeletal:       Right wrist: She exhibits tenderness. She exhibits normal range of motion, no swelling, no crepitus and no deformity.  Tender over the sheath containing the extensor pollicis tendons on the RIGHT; no fluctuance, no erythema; Finkelstein's test equivocal, but limited ROM with testing; hint of thenar atrophy on the RIGHT  Neurological: She is alert. She displays no tremor.  Grip 5/5 on the right   Mood/affect:  Euthymic, pleasant Appearance:  Casually dressed, good hygiene  6CIT Screen 06/27/2017  What Year? 0 points  What month? 0 points  What time? 0 points  Count back from 20 2 points  Months in reverse 0 points  Repeat phrase 6 points  Total Score 8    Assessment & Plan:     Annual Wellness Visit  Reviewed patient's Family Medical History Reviewed and updated list of patient's medical providers Assessment of cognitive impairment was done Assessed patient's functional ability Established a written schedule for health screening [place] Completed and Reviewed  Exercise Activities and Dietary recommendations Goals    None  Immunization History  Administered Date(s) Administered  . Influenza, High Dose Seasonal PF 03/22/2016  . Influenza,inj,Quad PF,6+ Mos 04/13/2015  . Influenza-Unspecified 03/18/2014, 03/24/2017  . Pneumococcal Conjugate-13 12/10/2013  . Pneumococcal Polysaccharide-23 04/13/2008  . Td 01/22/2004  . Tdap 06/27/2017  . Zoster 04/10/2006    Health Maintenance  Topic Date Due  . Samul Dada  06/28/2027  . INFLUENZA VACCINE  Completed  . PNA vac Low Risk Adult   Completed  . DEXA SCAN  Addressed    Discussed health benefits of physical activity, and encouraged her to engage in regular exercise appropriate for her age and condition.   Meds ordered this encounter  Medications  . Tdap (BOOSTRIX) injection 0.5 mL    Current Outpatient Medications:  .  amLODipine (NORVASC) 2.5 MG tablet, Take 1 tablet (2.5 mg total) by mouth daily., Disp: 90 tablet, Rfl: 3 .  atorvastatin (LIPITOR) 20 MG tablet, TAKE ONE TABLET BY MOUTH AT BEDTIME, Disp: 90 tablet, Rfl: 3 .  Calcium Carb-Cholecalciferol (CALCIUM 600 + D PO), Take by mouth daily., Disp: , Rfl:  .  cholecalciferol (VITAMIN D) 1000 units tablet, Take 1,000 Units by mouth daily., Disp: , Rfl:  .  Cyanocobalamin (B-12) 1000 MCG/ML KIT, Inject 1,000 mcg as directed every 30 (thirty) days., Disp: , Rfl:  .  ipratropium (ATROVENT) 0.03 % nasal spray, Place 2 sprays into both nostrils 3 (three) times daily., Disp: 90 mL, Rfl: 3 .  IRON PO, Take 1 tablet by mouth 2 (two) times daily. , Disp: , Rfl:  .  latanoprost (XALATAN) 0.005 % ophthalmic solution, Place 1 drop into the right eye at bedtime. , Disp: , Rfl:  .  levothyroxine (SYNTHROID, LEVOTHROID) 75 MCG tablet, TAKE ONE TABLET BY MOUTH ON MONDAY, WEDNESDAY AND FRIDAY., Disp: 40 tablet, Rfl: 2 .  levothyroxine (SYNTHROID, LEVOTHROID) 88 MCG tablet, TAKE ONE TABLET BY MOUTH ON TUESDAY, THURSDAY, SATURDAY, AND SUNDAY, Disp: 52 tablet, Rfl: 2 .  lisinopril (PRINIVIL,ZESTRIL) 5 MG tablet, Take 5 mg (1 tablet) by mouth daily if Systolic JK>932. Take 2.5 mg (1/2 tablet) by mouth daily is Systolic BP> 671., Disp: 90 tablet, Rfl: 3 .  Multiple Vitamins-Minerals (MULTIVITAMIN ADULT PO), Take by mouth., Disp: , Rfl:  .  Omega-3 Fatty Acids (FISH OIL) 1200 MG CAPS, Take by mouth daily., Disp: , Rfl:  .  rivaroxaban (XARELTO) 20 MG TABS tablet, Take 1 tablet (20 mg total) by mouth daily with supper., Disp: 30 tablet, Rfl: 6 .  timolol (TIMOPTIC) 0.5 % ophthalmic  solution, Place 1 drop into the right eye 2 (two) times daily. , Disp: , Rfl:  .  ACCU-CHEK SOFTCLIX LANCETS lancets, Use as instructed; check once a day; H40.1131, Disp: 100 each, Rfl: 12 .  glucose blood (ACCU-CHEK AVIVA PLUS) test strip, Use as instructed, check once a day; H40.1131, Disp: 100 each, Rfl: 3 Medications Discontinued During This Encounter  Medication Reason  . calcium carbonate (CALCIUM 600) 600 MG TABS tablet Duplicate  . aspirin 81 MG chewable tablet Completed Course  . ranitidine (ZANTAC) 150 MG tablet Completed Course    Next Medicare Wellness Visit in 12+ months  Problem List Items Addressed This Visit      Cardiovascular and Mediastinum   Syncope, near    No episodes since April 2018      Essential hypertension, benign    Controlled and stable        Other   Preventative health care - Primary    USPSTF grade A and  B recommendations reviewed with patient; age-appropriate recommendations, preventive care, screening tests, etc discussed and encouraged; healthy living encouraged; see AVS for patient education given to patient       Other Visit Diagnoses    Need for Tdap vaccination       Relevant Medications   Tdap (BOOSTRIX) injection 0.5 mL (Completed)   De Quervain's tenosynovitis, right       refer to orthopaedist   Relevant Orders   Ambulatory referral to Orthopedic Surgery   Memory impairment       do not drive until evaluated by neurologist; positive screen today; will have her see specialist   Relevant Orders   Ambulatory referral to Neurology   Postmenopausal       DEXA scan ordered; daughter will call to schedule   Relevant Orders   DG Bone Density   Counseling regarding advanced directives       living will paperwork given to patient; she sounds as though she would want treatment/resuscitation if it might help, but not stay maintained on machines

## 2017-06-27 NOTE — Patient Instructions (Addendum)
Make sure you have a thumb spica splint for the right wrist We'll have you see the orthopaedist about your wrist, and the neurologist about your memory I will officially recommend no driving until you see the neurologist are cleared to do so You received the vaccine to protect against tetanus and diphtheria and pertussis today; the tetanus and diphtheria portions will provide protection up to ten years, and the pertussis component will give you protection against whooping cough for life  Health Maintenance  Topic Date Due  . TETANUS/TDAP  01/21/2014  . INFLUENZA VACCINE  Completed  . PNA vac Low Risk Adult  Completed  . DEXA SCAN  Addressed   DEXA scan -- please schedule Please do call to schedule your bone density study; the number to schedule one at either [facility] or [facility] is [phone] or [phone] Tetanus given TODAY Fall Prevention in the Home Falls can cause injuries. They can happen to people of all ages. There are many things you can do to make your home safe and to help prevent falls. What can I do on the outside of my home?  Regularly fix the edges of walkways and driveways and fix any cracks.  Remove anything that might make you trip as you walk through a door, such as a raised step or threshold.  Trim any bushes or trees on the path to your home.  Use bright outdoor lighting.  Clear any walking paths of anything that might make someone trip, such as rocks or tools.  Regularly check to see if handrails are loose or broken. Make sure that both sides of any steps have handrails.  Any raised decks and porches should have guardrails on the edges.  Have any leaves, snow, or ice cleared regularly.  Use sand or salt on walking paths during winter.  Clean up any spills in your garage right away. This includes oil or grease spills. What can I do in the bathroom?  Use night lights.  Install grab bars by the toilet and in the tub  and shower. Do not use towel bars as grab bars.  Use non-skid mats or decals in the tub or shower.  If you need to sit down in the shower, use a plastic, non-slip stool.  Keep the floor dry. Clean up any water that spills on the floor as soon as it happens.  Remove soap buildup in the tub or shower regularly.  Attach bath mats securely with double-sided non-slip rug tape.  Do not have throw rugs and other things on the floor that can make you trip. What can I do in the bedroom?  Use night lights.  Make sure that you have a light by your bed that is easy to reach.  Do not use any sheets or blankets that are too big for your bed. They should not hang down onto the floor.  Have a firm chair that has side arms. You can use this for support while you get dressed.  Do not have throw rugs and other things on the floor that can make you trip. What can I do in the kitchen?  Clean up any spills right away.  Avoid walking on wet floors.  Keep items that you use a lot in easy-to-reach places.  If you need to reach something above you, use a strong step stool that has a grab bar.  Keep electrical cords out of the way.  Do not use floor polish or wax   that makes floors slippery. If you must use wax, use non-skid floor wax.  Do not have throw rugs and other things on the floor that can make you trip. What can I do with my stairs?  Do not leave any items on the stairs.  Make sure that there are handrails on both sides of the stairs and use them. Fix handrails that are broken or loose. Make sure that handrails are as long as the stairways.  Check any carpeting to make sure that it is firmly attached to the stairs. Fix any carpet that is loose or worn.  Avoid having throw rugs at the top or bottom of the stairs. If you do have throw rugs, attach them to the floor with carpet tape.  Make sure that you have a light switch at the top of the stairs and the bottom of the stairs. If you do  not have them, ask someone to add them for you. What else can I do to help prevent falls?  Wear shoes that: ? Do not have high heels. ? Have rubber bottoms. ? Are comfortable and fit you well. ? Are closed at the toe. Do not wear sandals.  If you use a stepladder: ? Make sure that it is fully opened. Do not climb a closed stepladder. ? Make sure that both sides of the stepladder are locked into place. ? Ask someone to hold it for you, if possible.  Clearly mark and make sure that you can see: ? Any grab bars or handrails. ? First and last steps. ? Where the edge of each step is.  Use tools that help you move around (mobility aids) if they are needed. These include: ? Canes. ? Walkers. ? Scooters. ? Crutches.  Turn on the lights when you go into a dark area. Replace any light bulbs as soon as they burn out.  Set up your furniture so you have a clear path. Avoid moving your furniture around.  If any of your floors are uneven, fix them.  If there are any pets around you, be aware of where they are.  Review your medicines with your doctor. Some medicines can make you feel dizzy. This can increase your chance of falling. Ask your doctor what other things that you can do to help prevent falls. This information is not intended to replace advice given to you by your health care provider. Make sure you discuss any questions you have with your health care provider. Document Released: 04/08/2009 Document Revised: 11/18/2015 Document Reviewed: 07/17/2014 Elsevier Interactive Patient Education  2018 [employer] Health Maintenance, Female Adopting a healthy lifestyle and getting preventive care can go a long way to promote health and wellness. Talk with your health care provider about what schedule of regular examinations is right for you. This is a good chance for you to check in with your provider about disease prevention and staying healthy. In between checkups, there are plenty of  things you can do on your own. Experts have done a lot of research about which lifestyle changes and preventive measures are most likely to keep you healthy. Ask your health care provider for more information. Weight and diet Eat a healthy diet  Be sure to include plenty of vegetables, fruits, low-fat dairy products, and lean protein.  Do not eat a lot of foods high in solid fats, added sugars, or salt.  Get regular exercise. This is one of the most important things you can do for your health. ?   Most adults should exercise for at least 150 minutes each week. The exercise should increase your heart rate and make you sweat (moderate-intensity exercise). ? Most adults should also do strengthening exercises at least twice a week. This is in addition to the moderate-intensity exercise.  Maintain a healthy weight  Body mass index (BMI) is a measurement that can be used to identify possible weight problems. It estimates body fat based on height and weight. Your health care provider can help determine your BMI and help you achieve or maintain a healthy weight.  For females 20 years of age and older: ? A BMI below 18.5 is considered underweight. ? A BMI of 18.5 to 24.9 is normal. ? A BMI of 25 to 29.9 is considered overweight. ? A BMI of 30 and above is considered obese.  Watch levels of cholesterol and blood lipids  You should start having your blood tested for lipids and cholesterol at 82 years of age, then have this test every 5 years.  You may need to have your cholesterol levels checked more often if: ? Your lipid or cholesterol levels are high. ? You are older than 82 years of age. ? You are at high risk for heart disease.  Cancer screening Lung Cancer  Lung cancer screening is recommended for adults 55-80 years old who are at high risk for lung cancer because of a history of smoking.  A yearly low-dose CT scan of the lungs is recommended for people who: ? Currently smoke. ? Have  quit within the past 15 years. ? Have at least a 30-pack-year history of smoking. A pack year is smoking an average of one pack of cigarettes a day for 1 year.  Yearly screening should continue until it has been 15 years since you quit.  Yearly screening should stop if you develop a health problem that would prevent you from having lung cancer treatment.  Breast Cancer  Practice breast self-awareness. This means understanding how your breasts normally appear and feel.  It also means doing regular breast self-exams. Let your health care provider know about any changes, no matter how small.  If you are in your 20s or 30s, you should have a clinical breast exam (CBE) by a health care provider every 1-3 years as part of a regular health exam.  If you are 40 or older, have a CBE every year. Also consider having a breast X-ray (mammogram) every year.  If you have a family history of breast cancer, talk to your health care provider about genetic screening.  If you are at high risk for breast cancer, talk to your health care provider about having an MRI and a mammogram every year.  Breast cancer gene (BRCA) assessment is recommended for women who have family members with BRCA-related cancers. BRCA-related cancers include: ? Breast. ? Ovarian. ? Tubal. ? Peritoneal cancers.  Results of the assessment will determine the need for genetic counseling and BRCA1 and BRCA2 testing.  Cervical Cancer Your health care provider may recommend that you be screened regularly for cancer of the pelvic organs (ovaries, uterus, and vagina). This screening involves a pelvic examination, including checking for microscopic changes to the surface of your cervix (Pap test). You may be encouraged to have this screening done every 3 years, beginning at age 21.  For women ages 30-65, health care providers may recommend pelvic exams and Pap testing every 3 years, or they may recommend the Pap and pelvic exam, combined  with testing for human   papilloma virus (HPV), every 5 years. Some types of HPV increase your risk of cervical cancer. Testing for HPV may also be done on women of any age with unclear Pap test results.  Other health care providers may not recommend any screening for nonpregnant women who are considered low risk for pelvic cancer and who do not have symptoms. Ask your health care provider if a screening pelvic exam is right for you.  If you have had past treatment for cervical cancer or a condition that could lead to cancer, you need Pap tests and screening for cancer for at least 20 years after your treatment. If Pap tests have been discontinued, your risk factors (such as having a new sexual partner) need to be reassessed to determine if screening should resume. Some women have medical problems that increase the chance of getting cervical cancer. In these cases, your health care provider may recommend more frequent screening and Pap tests.  Colorectal Cancer  This type of cancer can be detected and often prevented.  Routine colorectal cancer screening usually begins at 82 years of age and continues through 82 years of age.  Your health care provider may recommend screening at an earlier age if you have risk factors for colon cancer.  Your health care provider may also recommend using home test kits to check for hidden blood in the stool.  A small camera at the end of a tube can be used to examine your colon directly (sigmoidoscopy or colonoscopy). This is done to check for the earliest forms of colorectal cancer.  Routine screening usually begins at age 50.  Direct examination of the colon should be repeated every 5-10 years through 82 years of age. However, you may need to be screened more often if early forms of precancerous polyps or small growths are found.  Skin Cancer  Check your skin from head to toe regularly.  Tell your health care provider about any new moles or changes in moles,  especially if there is a change in a mole's shape or color.  Also tell your health care provider if you have a mole that is larger than the size of a pencil eraser.  Always use sunscreen. Apply sunscreen liberally and repeatedly throughout the day.  Protect yourself by wearing long sleeves, pants, a wide-brimmed hat, and sunglasses whenever you are outside.  Heart disease, diabetes, and high blood pressure  High blood pressure causes heart disease and increases the risk of stroke. High blood pressure is more likely to develop in: ? People who have blood pressure in the high end of the normal range (130-139/85-89 mm Hg). ? People who are overweight or obese. ? People who are African American.  If you are 18-39 years of age, have your blood pressure checked every 3-5 years. If you are 40 years of age or older, have your blood pressure checked every year. You should have your blood pressure measured twice-once when you are at a hospital or clinic, and once when you are not at a hospital or clinic. Record the average of the two measurements. To check your blood pressure when you are not at a hospital or clinic, you can use: ? An automated blood pressure machine at a pharmacy. ? A home blood pressure monitor.  If you are between 55 years and 79 years old, ask your health care provider if you should take aspirin to prevent strokes.  Have regular diabetes screenings. This involves taking a blood sample to check your fasting   blood sugar level. ? If you are at a normal weight and have a low risk for diabetes, have this test once every three years after 82 years of age. ? If you are overweight and have a high risk for diabetes, consider being tested at a younger age or more often. Preventing infection Hepatitis B  If you have a higher risk for hepatitis B, you should be screened for this virus. You are considered at high risk for hepatitis B if: ? You were born in a country where hepatitis B is  common. Ask your health care provider which countries are considered high risk. ? Your parents were born in a high-risk country, and you have not been immunized against hepatitis B (hepatitis B vaccine). ? You have HIV or AIDS. ? You use needles to inject street drugs. ? You live with someone who has hepatitis B. ? You have had sex with someone who has hepatitis B. ? You get hemodialysis treatment. ? You take certain medicines for conditions, including cancer, organ transplantation, and autoimmune conditions.  Hepatitis C  Blood testing is recommended for: ? Everyone born from [birth year] through 1963. ? Anyone with known risk factors for hepatitis C.  Sexually transmitted infections (STIs)  You should be screened for sexually transmitted infections (STIs) including gonorrhea and chlamydia if: ? You are sexually active and are younger than 82 years of age. ? You are older than 82 years of age and your health care provider tells you that you are at risk for this type of infection. ? Your sexual activity has changed since you were last screened and you are at an increased risk for chlamydia or gonorrhea. Ask your health care provider if you are at risk.  If you do not have HIV, but are at risk, it may be recommended that you take a prescription medicine daily to prevent HIV infection. This is called pre-exposure prophylaxis (PrEP). You are considered at risk if: ? You are sexually active and do not regularly use condoms or know the HIV status of your partner(s). ? You take drugs by injection. ? You are sexually active with a partner who has HIV.  Talk with your health care provider about whether you are at high risk of being infected with HIV. If you choose to begin PrEP, you should first be tested for HIV. You should then be tested every 3 months for as long as you are taking PrEP. Pregnancy  If you are premenopausal and you may become pregnant, ask your health care provider about preconception  counseling.  If you may become pregnant, take 400 to 800 micrograms (mcg) of folic acid every day.  If you want to prevent pregnancy, talk to your health care provider about birth control (contraception). Osteoporosis and menopause  Osteoporosis is a disease in which the bones lose minerals and strength with aging. This can result in serious bone fractures. Your risk for osteoporosis can be identified using a bone density scan.  If you are 65 years of age or older, or if you are at risk for osteoporosis and fractures, ask your health care provider if you should be screened.  Ask your health care provider whether you should take a calcium or vitamin D supplement to lower your risk for osteoporosis.  Menopause may have certain physical symptoms and risks.  Hormone replacement therapy may reduce some of these symptoms and risks. Talk to your health care provider about whether hormone replacement therapy is right for you. Follow these  instructions at home:  Schedule regular health, dental, and eye exams.  Stay current with your immunizations.  Do not use any tobacco products including cigarettes, chewing tobacco, or electronic cigarettes.  If you are pregnant, do not drink alcohol.  If you are breastfeeding, limit how much and how often you drink alcohol.  Limit alcohol intake to no more than 1 drink per day for nonpregnant women. One drink equals 12 ounces of beer, 5 ounces of wine, or 1 ounces of hard liquor.  Do not use street drugs.  Do not share needles.  Ask your health care provider for help if you need support or information about quitting drugs.  Tell your health care provider if you often feel depressed.  Tell your health care provider if you have ever been abused or do not feel safe at home. This information is not intended to replace advice given to you by your health care provider. Make sure you discuss any questions you have with your health care provider. Document  Released: 12/26/2010 Document Revised: 11/18/2015 Document Reviewed: 03/16/2015 Elsevier Interactive Patient Education  2018 [employer]vier Inc.  

## 2017-06-27 NOTE — Progress Notes (Signed)
BP 126/80 (BP Location: Right Arm, Patient Position: Sitting, Cuff Size: Normal)   Pulse 71   Temp (!) 97.4 F (36.3 C) (Oral)   Ht 4' 11.75" (1.518 m)   Wt 132 lb 6.4 oz (60.1 kg)   LMP  (LMP Unknown)   SpO2 98%   BMI 26.07 kg/m    Subjective:    Patient ID: Sydney Mcdaniel, female    DOB: 11-03-1924, 84 y.o.   MRN: [id number]  HPI: Sydney Mcdaniel is a 84 y.o. female  Chief Complaint  Patient presents with  . Medicare Wellness  . Wrist Pain    RT wrist pain, has been taking tylenol, and aspercreame, using wrist brace   . Immunizations    discuss tdap     HPI   Depression screen [facility] 2/9 06/27/2017 06/22/2016 03/22/2016 02/25/2016 12/21/2015  Decreased Interest 0 0 0 0 0  Down, Depressed, Hopeless 0 0 0 0 0  PHQ - 2 Score 0 0 0 0 0  Altered sleeping - - - - -  Tired, decreased energy - - - - -  Change in appetite - - - - -  Feeling bad or failure about yourself  - - - - -  Trouble concentrating - - - - -  Moving slowly or fidgety/restless - - - - -  Suicidal thoughts - - - - -  PHQ-9 Score - - - - -    Relevant past medical, surgical, family and social history reviewed Past Medical History:  Diagnosis Date  . Allergy   . Anemia   . Cataract   . Glaucoma   . H/O: hysterectomy    Total  . Hyperlipidemia   . Hypertension   . Hypothyroidism   . Impaired fasting glucose   . Lichen sclerosus   . Osteoporosis    Hips  . Syncope    a. 03/2016 Echo: EF55-60%, no rwma, mild AI/MR, nl PASP; b. 03/2016 48h Holter: no significant arrhythmias/pauses; c. 03/2016 MV: mild apical defect, likely breast attenuation, nl EF, low risk.   Past Surgical History:  Procedure Laterality Date  . APPENDECTOMY    . EYE SURGERY     Glaucoma  . TOTAL ABDOMINAL HYSTERECTOMY     Family History  Problem Relation Age of Onset  . Heart attack Mother   . Glaucoma Mother   . Heart attack Father   . Parkinson's disease Brother   . Heart attack Brother    Social History   Tobacco  Use  . Smoking status: Never Smoker  . Smokeless tobacco: Never Used  Substance Use Topics  . Alcohol use: No  . Drug use: No    Interim medical history since last visit reviewed. Allergies and medications reviewed  Review of Systems Per HPI unless specifically indicated above     Objective:    BP 126/80 (BP Location: Right Arm, Patient Position: Sitting, Cuff Size: Normal)   Pulse 71   Temp (!) 97.4 F (36.3 C) (Oral)   Ht 4' 11.75" (1.518 m)   Wt 132 lb 6.4 oz (60.1 kg)   LMP  (LMP Unknown)   SpO2 98%   BMI 26.07 kg/m   Wt Readings from Last 3 Encounters:  06/27/17 132 lb 6.4 oz (60.1 kg)  04/18/17 134 lb 8 oz (61 kg)  03/06/17 133 lb 7 oz (60.5 kg)    Physical Exam  Results for orders placed or performed in visit on 03/06/17  CBC with Differential/Platelet  Result  Value Ref Range   WBC 6.3 3.6 - 11.0 K/uL   RBC 3.52 (L) 3.80 - 5.20 MIL/uL   Hemoglobin 10.9 (L) 12.0 - 16.0 g/dL   HCT 31.5 (L) 35.0 - 47.0 %   MCV 89.4 80.0 - 100.0 fL   MCH 31.0 26.0 - 34.0 pg   MCHC 34.7 32.0 - 36.0 g/dL   RDW 15.7 (H) 11.5 - 14.5 %   Platelets 196 150 - 440 K/uL   Neutrophils Relative % 38 %   Neutro Abs 2.4 1.4 - 6.5 K/uL   Lymphocytes Relative 51 %   Lymphs Abs 3.2 1.0 - 3.6 K/uL   Monocytes Relative 8 %   Monocytes Absolute 0.5 0.2 - 0.9 K/uL   Eosinophils Relative 2 %   Eosinophils Absolute 0.2 0 - 0.7 K/uL   Basophils Relative 1 %   Basophils Absolute 0.0 0 - 0.1 K/uL  Basic metabolic panel  Result Value Ref Range   Sodium 134 (L) 135 - 145 mmol/L   Potassium 4.3 3.5 - 5.1 mmol/L   Chloride 101 101 - 111 mmol/L   CO2 26 22 - 32 mmol/L   Glucose, Bld 109 (H) 65 - 99 mg/dL   BUN 13 6 - 20 mg/dL   Creatinine, Ser 0.73 0.44 - 1.00 mg/dL   Calcium 9.2 8.9 - 10.3 mg/dL   GFR calc non Af Amer >60 >60 mL/min   GFR calc Af Amer >60 >60 mL/min   Anion gap 7 5 - 15  Ferritin  Result Value Ref Range   Ferritin 31 11 - 307 ng/mL  Reticulocytes  Result Value Ref Range    Retic Ct Pct 1.0 0.4 - 3.1 %   RBC. 3.63 (L) 3.80 - 5.20 MIL/uL   Retic Count, Absolute 36.3 19.0 - 183.0 K/uL      Assessment & Plan:   Problem List Items Addressed This Visit    None       Follow up plan: No Follow-up on file.  An after-visit summary was printed and given to the patient at [facility].  Please see the patient instructions which may contain other information and recommendations beyond what is mentioned above in the assessment and plan.  No orders of the defined types were placed in this encounter.   No orders of the defined types were placed in this encounter.

## 2017-06-27 NOTE — Assessment & Plan Note (Signed)
USPSTF grade A and B recommendations reviewed with patient; age-appropriate recommendations, preventive care, screening tests, etc discussed and encouraged; healthy living encouraged; see AVS for patient education given to patient  

## 2017-06-27 NOTE — Assessment & Plan Note (Signed)
Controlled and stable

## 2017-07-09 ENCOUNTER — Encounter: Payer: Self-pay | Admitting: Hematology and Oncology

## 2017-07-09 ENCOUNTER — Inpatient Hospital Stay (HOSPITAL_BASED_OUTPATIENT_CLINIC_OR_DEPARTMENT_OTHER): Payer: Medicare Other | Admitting: Hematology and Oncology

## 2017-07-09 ENCOUNTER — Other Ambulatory Visit: Payer: Self-pay

## 2017-07-09 ENCOUNTER — Inpatient Hospital Stay: Payer: Medicare Other | Attending: Hematology and Oncology

## 2017-07-09 ENCOUNTER — Inpatient Hospital Stay: Payer: Medicare Other

## 2017-07-09 VITALS — BP 176/73 | HR 57 | Temp 98.3°F | Wt 133.8 lb

## 2017-07-09 DIAGNOSIS — I4891 Unspecified atrial fibrillation: Secondary | ICD-10-CM | POA: Insufficient documentation

## 2017-07-09 DIAGNOSIS — E538 Deficiency of other specified B group vitamins: Secondary | ICD-10-CM

## 2017-07-09 DIAGNOSIS — R918 Other nonspecific abnormal finding of lung field: Secondary | ICD-10-CM

## 2017-07-09 DIAGNOSIS — R55 Syncope and collapse: Secondary | ICD-10-CM

## 2017-07-09 DIAGNOSIS — Z7901 Long term (current) use of anticoagulants: Secondary | ICD-10-CM | POA: Insufficient documentation

## 2017-07-09 DIAGNOSIS — D649 Anemia, unspecified: Secondary | ICD-10-CM

## 2017-07-09 DIAGNOSIS — C9110 Chronic lymphocytic leukemia of B-cell type not having achieved remission: Secondary | ICD-10-CM | POA: Diagnosis not present

## 2017-07-09 LAB — CBC WITH DIFFERENTIAL/PLATELET
Basophils Absolute: 0 10*3/uL (ref 0–0.1)
Basophils Relative: 1 %
Eosinophils Absolute: 0.1 10*3/uL (ref 0–0.7)
Eosinophils Relative: 2 %
HCT: 34 % — ABNORMAL LOW (ref 35.0–47.0)
Hemoglobin: 11.4 g/dL — ABNORMAL LOW (ref 12.0–16.0)
Lymphocytes Relative: 47 %
Lymphs Abs: 3.6 10*3/uL (ref 1.0–3.6)
MCH: 31.1 pg (ref 26.0–34.0)
MCHC: 33.6 g/dL (ref 32.0–36.0)
MCV: 92.5 fL (ref 80.0–100.0)
Monocytes Absolute: 0.6 10*3/uL (ref 0.2–0.9)
Monocytes Relative: 8 %
Neutro Abs: 3.2 10*3/uL (ref 1.4–6.5)
Neutrophils Relative %: 42 %
Platelets: 210 10*3/uL (ref 150–440)
RBC: 3.68 MIL/uL — ABNORMAL LOW (ref 3.80–5.20)
RDW: 14.1 % (ref 11.5–14.5)
WBC: 7.6 10*3/uL (ref 3.6–11.0)

## 2017-07-09 LAB — FERRITIN: Ferritin: 59 ng/mL (ref 11–307)

## 2017-07-09 LAB — BASIC METABOLIC PANEL
Anion gap: 7 (ref 5–15)
BUN: 12 mg/dL (ref 6–20)
CO2: 26 mmol/L (ref 22–32)
Calcium: 9.1 mg/dL (ref 8.9–10.3)
Chloride: 99 mmol/L — ABNORMAL LOW (ref 101–111)
Creatinine, Ser: 0.68 mg/dL (ref 0.44–1.00)
GFR calc Af Amer: >60 mL/min (ref >60)
GFR calc non Af Amer: >60 mL/min (ref >60)
Glucose, Bld: 104 mg/dL — ABNORMAL HIGH (ref 65–99)
Potassium: 4.5 mmol/L (ref 3.5–5.1)
Sodium: 132 mmol/L — ABNORMAL LOW (ref 135–145)

## 2017-07-09 MED ORDER — CYANOCOBALAMIN 1000 MCG/ML IJ SOLN
1000.0000 ug | Freq: Once | INTRAMUSCULAR | Status: AC
  Administered 2017-07-09: 1000 ug via INTRAMUSCULAR
  Filled 2017-07-09: qty 1

## 2017-07-09 NOTE — Progress Notes (Signed)
[facility] day:  07/09/17  Chief Complaint: Sydney Mcdaniel is a 84 y.o. female with chronic lymphocytic leukemia, B12 deficiency, and anemia who is seen for 4 month assessment.  HPI:  The patient was last seen in the hematology clinic on 03/06/2017.  At that time, she denied any B symptoms.  Exam revealed no adenopathy or hepatosplenomegaly.  Hematocrit was 31.5 and hemoglobin 10.9.  She received B12.  She has received B12 monthly (04/09/2017 - 06/06/2017).  During the interm, patient is doing well. She denies any physical complaints today.  Patient has not experienced any bruising or bleeding. P atient has not experienced any B symptoms or interval infections. She continues to eat well.    Past Medical History:  Diagnosis Date  . Allergy   . Anemia   . Cataract   . Glaucoma   . H/O: hysterectomy    Total  . Hyperlipidemia   . Hypertension   . Hypothyroidism   . Impaired fasting glucose   . Lichen sclerosus   . Osteoporosis    Hips  . Syncope    a. 03/2016 Echo: EF55-60%, no rwma, mild AI/MR, nl PASP; b. 03/2016 48h Holter: no significant arrhythmias/pauses; c. 03/2016 MV: mild apical defect, likely breast attenuation, nl EF, low risk.    Past Surgical History:  Procedure Laterality Date  . APPENDECTOMY    . EYE SURGERY     Glaucoma  . TOTAL ABDOMINAL HYSTERECTOMY      Family History  Problem Relation Age of Onset  . Heart attack Mother   . Glaucoma Mother   . Heart attack Father   . Parkinson's disease Brother   . Heart attack Brother     Social History:  reports that  has never smoked. she has never used smokeless tobacco. She reports that she does not drink alcohol or use drugs.  Her husband died in 01-16-2015.  She notes that she played basketball 75 years ago on a half court.  She lives in [place].  She is accompanied by her daughter, [name].  Allergies:  Allergies  Allergen Reactions  . Alphagan [Brimonidine]  Itching and Rash  . Pravachol [Pravastatin Sodium] Itching and Rash    Current Medications: Current Outpatient Medications  Medication Sig Dispense Refill  . ACCU-CHEK SOFTCLIX LANCETS lancets Use as instructed; check once a day; H40.1131 100 each 12  . amLODipine (NORVASC) 2.5 MG tablet Take 1 tablet (2.5 mg total) by mouth daily. 90 tablet 3  . atorvastatin (LIPITOR) 20 MG tablet TAKE ONE TABLET BY MOUTH AT BEDTIME 90 tablet 3  . Calcium Carb-Cholecalciferol (CALCIUM 600 + D PO) Take by mouth daily.    . cholecalciferol (VITAMIN D) 1000 units tablet Take 1,000 Units by mouth daily.    . Cyanocobalamin (B-12) 1000 MCG/ML KIT Inject 1,000 mcg as directed every 30 (thirty) days.    Marland Kitchen glucose blood (ACCU-CHEK AVIVA PLUS) test strip Use as instructed, check once a day; H40.1131 100 each 3  . ipratropium (ATROVENT) 0.03 % nasal spray Place 2 sprays into both nostrils 3 (three) times daily. 90 mL 3  . IRON PO Take 1 tablet by mouth 2 (two) times daily.     Marland Kitchen latanoprost (XALATAN) 0.005 % ophthalmic solution Place 1 drop into the right eye at bedtime.     Marland Kitchen levothyroxine (SYNTHROID, LEVOTHROID) 75 MCG tablet TAKE ONE TABLET BY MOUTH ON MONDAY, WEDNESDAY AND FRIDAY. 40 tablet 2  . levothyroxine (SYNTHROID,  LEVOTHROID) 88 MCG tablet TAKE ONE TABLET BY MOUTH ON TUESDAY, THURSDAY, SATURDAY, AND SUNDAY 52 tablet 2  . lisinopril (PRINIVIL,ZESTRIL) 5 MG tablet Take 5 mg (1 tablet) by mouth daily if Systolic IR>518. Take 2.5 mg (1/2 tablet) by mouth daily is Systolic BP> 841. 90 tablet 3  . Multiple Vitamins-Minerals (MULTIVITAMIN ADULT PO) Take by mouth.    . Omega-3 Fatty Acids (FISH OIL) 1200 MG CAPS Take by mouth daily.    . rivaroxaban (XARELTO) 20 MG TABS tablet Take 1 tablet (20 mg total) by mouth daily with supper. 30 tablet 6  . timolol (TIMOPTIC) 0.5 % ophthalmic solution Place 1 drop into the right eye 2 (two) times daily.      No current facility-administered medications for this visit.      Review of Systems:  GENERAL:  Feels "ok".  No fevers or sweats.  Weight up 1 pound. PERFORMANCE STATUS (ECOG):  1 HEENT:  Glaucoma.  No visual changes, runny nose, sore throat, mouth sores or tenderness. Lungs: No shortness of breath.  Cough at night.  h/o hemoptysis (see HPI). Cardiac:  Atrial fibrillation.  No chest pain, palpitations, orthopnea, or PND.  BP meds titrated. GI:  No nausea, vomiting, diarrhea, constipation, melena or hematochezia.  Colonoscopy > 10 years ago. GU:  No urgency, frequency, dysuria, or hematuria. Up 2-3 times at night.  Drinking Gatorade. Musculoskeletal:  No back pain.  No joint pain.  No muscle tenderness. Extremities:  No pain or swelling. Skin:  No rashes or skin changes. Neuro:  No headache, numbness or weakness, balance or coordination issues. Endocrine:  No diabetes.  On Synthroid for thyroid disease.  No hot flashes or night sweats. Psych:  No mood changes, depression or anxiety. Pain:  No focal pain. Review of systems:  All other systems reviewed and found to be negative.  Physical Exam: Blood pressure (!) 176/73, pulse (!) 57, temperature 98.3 F (36.8 C), temperature source Tympanic, weight 133 lb 12.8 oz (60.7 kg). GENERAL:  Well developed, well nourished, woman sitting comfortably in the exam room in no acute distress.  She has a cane at her side.  She requires assistance onto the exam table. MENTAL STATUS:  Alert and oriented to person, place and time. HEAD:  Curly dark blonde/gray hair.  Normocephalic, atraumatic, face symmetric, no Cushingoid features. EYES:  Glasses.  Blue eyes.  Pupils equal round and reactive to light and accomodation.  No conjunctivitis or scleral icterus. ENT:  Oropharynx clear without lesion.  Tongue normal. Mucous membranes moist.  RESPIRATORY:  Clear to auscultation without rales, wheezes or rhonchi. CARDIOVASCULAR: Regular rate and rhythm without murmur, rub or gallop. ABDOMEN:  Soft, non-tender, with active  bowel sounds, and no hepatosplenomegaly.  No masses. SKIN:  No rashes, ulcers or lesions. EXTREMITIES: Trace lower extremity edema.  No skin discoloration or tenderness.  No palpable cords. LYMPH NODES: No palpable cervical, supraclavicular, axillary or inguinal adenopathy  NEUROLOGICAL: Unremarkable. PSYCH:  Appropriate.   Appointment on 07/09/2017  Component Date Value Ref Range Status  . Ferritin 07/09/2017 59  11 - 307 ng/mL Final   Performed at [facility], [address] [place], [place] [address]  . Sodium 07/09/2017 132* 135 - 145 mmol/L Final  . Potassium 07/09/2017 4.5  3.5 - 5.1 mmol/L Final  . Chloride 07/09/2017 99* 101 - 111 mmol/L Final  . CO2 07/09/2017 26  22 - 32 mmol/L Final  . Glucose, Bld 07/09/2017 104* 65 - 99 mg/dL Final  .  BUN 07/09/2017 12  6 - 20 mg/dL Final  . Creatinine, Ser 07/09/2017 0.68  0.44 - 1.00 mg/dL Final  . Calcium 07/09/2017 9.1  8.9 - 10.3 mg/dL Final  . GFR calc non Af Amer 07/09/2017 >60  >60 mL/min Final  . GFR calc Af Amer 07/09/2017 >60  >60 mL/min Final   Comment: (NOTE) The eGFR has been calculated using the CKD EPI equation. This calculation has not been validated in all clinical situations. eGFR's persistently <60 mL/min signify possible Chronic Kidney Disease.   Georgiann Hahn gap 07/09/2017 7  5 - 15 Final   Performed at [facility], [address] [place], [place] [address]  . WBC 07/09/2017 7.6  3.6 - 11.0 K/uL Final  . RBC 07/09/2017 3.68* 3.80 - 5.20 MIL/uL Final  . Hemoglobin 07/09/2017 11.4* 12.0 - 16.0 g/dL Final  . HCT 07/09/2017 34.0* 35.0 - 47.0 % Final  . MCV 07/09/2017 92.5  80.0 - 100.0 fL Final  . MCH 07/09/2017 31.1  26.0 - 34.0 pg Final  . MCHC 07/09/2017 33.6  32.0 - 36.0 g/dL Final  . RDW 07/09/2017 14.1  11.5 - 14.5 % Final  . Platelets 07/09/2017 210  150 - 440 K/uL Final  . Neutrophils Relative % 07/09/2017 42  % Final  . Neutro Abs 07/09/2017 3.2  1.4 - 6.5 K/uL Final  . Lymphocytes  Relative 07/09/2017 47  % Final  . Lymphs Abs 07/09/2017 3.6  1.0 - 3.6 K/uL Final  . Monocytes Relative 07/09/2017 8  % Final  . Monocytes Absolute 07/09/2017 0.6  0.2 - 0.9 K/uL Final  . Eosinophils Relative 07/09/2017 2  % Final  . Eosinophils Absolute 07/09/2017 0.1  0 - 0.7 K/uL Final  . Basophils Relative 07/09/2017 1  % Final  . Basophils Absolute 07/09/2017 0.0  0 - 0.1 K/uL Final   Performed at [facility], [address], [place], [place] [address]    Assessment:  Sydney Mcdaniel is a 84 y.o. female with stage 0 CLL.  She presented with a distant history of anemia and recent lymphocytosis.  She was noted to have mild lymphocystosis in 06/22/2015.  Absolute lymphocyte count (ALC) has ranged between 4700 - 5500.  Work-up on 01/10/2016 revealed a hematocrit of 31.6, hemoglobin 11.4, MCV 89, platelets 203,000, WBC 11,00 with an ANC of 3700.  Absolute lymphocyte count was 6400.  Ferritin was 116.  B12 was 119 (low).  Folate was 44.0.  Retic was 0.9% (low).  Uric acid was 4.5.  Guaiac cards were negative x 3 in 12/2015.  She was diagnosed with B12 deficiency.  She receives B12 monthly (last 06/06/2017).  Folate was 38 on 08/07/2016.  Peripheral blood flow cytometry revealed involvement by a CD5+, CD23+, CD38- monoclonal B cell population with lambda light chain restriction consistent with CLL/SLL.  There was a small population (8% of the T cells) of double positive (CD4+/CD8+) T cells (described in association with chronic viral infections, autoimmune disorders, chronic inflammatory disorders, and immunodeficiency states).   She has a slight progressive normocytic anemia.  Work-up on 08/07/2016 revealed the following normal labs: creatinine, LDH, uric acid, ferritin (105), iron studies (12% sat; TIBC 295), and folate.  Retic was 1.1% (low for level of anemia).  Coombs was negative on 09/04/2016.  Diet is modest.  She denies any melena or hematochezia.  Guaiac cards were negative x 3 in  07/2016.  Last colonoscopy was > 10 years ago.  Ferritin has been followed:  116 on 01/10/2016, 105 on 08/07/2016, 29 on 11/27/2016, 31 on 03/06/2017, and 59 on 07/09/2017.  She was diagnosed with atrial fibrillation after presenting with syncopal episodes.  She is on Xarelto.  Chest CT on 11/07/2016 revealed two 2 mm right lung nodules (right major fissure and lateral aspect RUL).  Non-contrast chest CT can be considered in 12 months if patient is high-risk.   There was a 5 mm right lower lobe ground-glass nodule.  She has an appointment with pulmonology.  Symptomatically, she denies any B symptoms.  Exam reveals no palpable adenopathy or hepatosplenomegaly.  Hematocrit is 34.0 and hemoglobin 11.4.  Plan: 1.  Labs today:  CBC with diff, BMP, ferritin. 2.  Discuss continued observation without intervention for CLL. 3.  B12 today and monthly x 3 for B12. 4.  Schedule non-contrast CT chest in 11/07/2017 to follow up on pulmonary nodules.  5.  RTC after CT scan for MD assessment, labs (CBC with diff, BMP, ferritin), review of chest CT, and B12.  6.  Anticipate change in clinic visits to every 6 months after next visit.    Honor Loh, NP  07/09/2017, 10:19 AM   I saw and evaluated the patient, participating in the key portions of the service and reviewing pertinent diagnostic studies and records.  I reviewed the nurse practitioner's note and agree with the findings and the plan.  The assessment and plan were discussed with the patient.  A few questions were asked by the patient and answered.   Lequita Asal, MD 07/09/2017, 10:19 AM

## 2017-07-10 ENCOUNTER — Other Ambulatory Visit: Payer: Self-pay | Admitting: *Deleted

## 2017-07-10 DIAGNOSIS — M654 Radial styloid tenosynovitis [de Quervain]: Secondary | ICD-10-CM | POA: Diagnosis not present

## 2017-07-10 DIAGNOSIS — C9110 Chronic lymphocytic leukemia of B-cell type not having achieved remission: Secondary | ICD-10-CM

## 2017-07-15 ENCOUNTER — Encounter: Payer: Self-pay | Admitting: Hematology and Oncology

## 2017-07-31 DIAGNOSIS — G3184 Mild cognitive impairment, so stated: Secondary | ICD-10-CM | POA: Diagnosis not present

## 2017-08-06 ENCOUNTER — Telehealth: Payer: Self-pay | Admitting: Family Medicine

## 2017-08-06 ENCOUNTER — Inpatient Hospital Stay: Payer: Medicare Other | Attending: Hematology and Oncology

## 2017-08-06 ENCOUNTER — Ambulatory Visit: Payer: Medicare Other

## 2017-08-06 DIAGNOSIS — E538 Deficiency of other specified B group vitamins: Secondary | ICD-10-CM

## 2017-08-06 MED ORDER — CYANOCOBALAMIN 1000 MCG/ML IJ SOLN
1000.0000 ug | Freq: Once | INTRAMUSCULAR | Status: AC
  Administered 2017-08-06: 1000 ug via INTRAMUSCULAR

## 2017-08-06 NOTE — Telephone Encounter (Signed)
Copied from [place]. Topic: General - Other >> Aug 06, 2017 12:27 PM Cecelia Byars, NT wrote: Reason for CRM: Patients  daughter called and said pt was in on 06/27/17 ,she needs a receipt that says in the comment section  she paid for a tetanus injection  on that day for for $66.00.  For insurance purposes please call [phone]

## 2017-08-08 ENCOUNTER — Other Ambulatory Visit: Payer: Self-pay | Admitting: Family Medicine

## 2017-08-08 DIAGNOSIS — E039 Hypothyroidism, unspecified: Secondary | ICD-10-CM

## 2017-08-09 ENCOUNTER — Telehealth: Payer: Self-pay

## 2017-08-09 NOTE — Telephone Encounter (Signed)
Signing off on old MyChart note; does not appear to have been received Responded to patient

## 2017-08-09 NOTE — Telephone Encounter (Signed)
Pt daughter notified   

## 2017-08-09 NOTE — Telephone Encounter (Signed)
Daughter needs a printed sheet Or receipt of the money she paid for the Tdap.  They received knowing medicare would not pay so they paid out of pocket, because she  Is around small great grand children.  They are going to try to file with her supplement to see if they will pay and needs a handout stating how much she paid and for what shot. Can you help with this?

## 2017-08-09 NOTE — Telephone Encounter (Signed)
Lab Results  Component Value Date   TSH 2.158 06/27/2016   Will order TSH --------------------------------- Please contact patient or daughter Ask her to get a thyroid test drawn with her next set of labs (next month or so is fine) Thank you

## 2017-08-12 DIAGNOSIS — G3184 Mild cognitive impairment, so stated: Secondary | ICD-10-CM | POA: Insufficient documentation

## 2017-08-13 ENCOUNTER — Other Ambulatory Visit: Payer: Self-pay

## 2017-08-13 ENCOUNTER — Telehealth: Payer: Self-pay | Admitting: Family Medicine

## 2017-08-13 DIAGNOSIS — E039 Hypothyroidism, unspecified: Secondary | ICD-10-CM | POA: Diagnosis not present

## 2017-08-13 LAB — TSH: TSH: 0.68 m[IU]/L (ref 0.40–4.50)

## 2017-08-13 NOTE — Telephone Encounter (Signed)
Copied from [place] [id number]. Topic: Quick Communication - Rx Refill/Question >> Aug 13, 2017 10:27 AM Margot Ables wrote: Medication: levothyroxine - pharmacy called to see if ok to change manufacturer Has the patient contacted their pharmacy? Yes.  Pharmacy calling Preferred Pharmacy (with phone number or street name): [place] (N), [place] [phone] (Phone) [phone] (Fax)

## 2017-08-13 NOTE — Telephone Encounter (Signed)
Has this already been addressed

## 2017-08-14 ENCOUNTER — Other Ambulatory Visit: Payer: Self-pay | Admitting: Family Medicine

## 2017-08-14 DIAGNOSIS — E039 Hypothyroidism, unspecified: Secondary | ICD-10-CM

## 2017-08-14 MED ORDER — LEVOTHYROXINE SODIUM 75 MCG PO TABS: ORAL_TABLET | ORAL | 2 refills | Status: DC

## 2017-08-14 MED ORDER — LEVOTHYROXINE SODIUM 88 MCG PO TABS: ORAL_TABLET | ORAL | 2 refills | Status: DC

## 2017-08-14 NOTE — Progress Notes (Signed)
Very slight decrease in meds since TSH under 1; new refills sent; message to patient in [place]

## 2017-08-15 ENCOUNTER — Encounter: Payer: Self-pay | Admitting: Family Medicine

## 2017-08-20 NOTE — Telephone Encounter (Signed)
Letter is at the front desk and patient has been made aware.

## 2017-09-03 ENCOUNTER — Inpatient Hospital Stay: Payer: Medicare Other | Attending: Hematology and Oncology

## 2017-09-03 ENCOUNTER — Ambulatory Visit: Payer: Medicare Other

## 2017-09-03 DIAGNOSIS — E538 Deficiency of other specified B group vitamins: Secondary | ICD-10-CM | POA: Diagnosis not present

## 2017-09-03 MED ORDER — CYANOCOBALAMIN 1000 MCG/ML IJ SOLN
1000.0000 ug | Freq: Once | INTRAMUSCULAR | Status: AC
  Administered 2017-09-03: 1000 ug via INTRAMUSCULAR

## 2017-09-19 ENCOUNTER — Encounter: Payer: Self-pay | Admitting: Family Medicine

## 2017-09-19 MED ORDER — METRONIDAZOLE 0.75 % EX CREA: TOPICAL_CREAM | Freq: Two times a day (BID) | CUTANEOUS | 0 refills | Status: DC

## 2017-10-01 ENCOUNTER — Ambulatory Visit: Payer: Medicare Other

## 2017-10-01 ENCOUNTER — Other Ambulatory Visit: Payer: Self-pay

## 2017-10-01 ENCOUNTER — Inpatient Hospital Stay: Payer: Medicare Other | Attending: Hematology and Oncology

## 2017-10-01 DIAGNOSIS — E538 Deficiency of other specified B group vitamins: Secondary | ICD-10-CM | POA: Diagnosis not present

## 2017-10-01 DIAGNOSIS — E039 Hypothyroidism, unspecified: Secondary | ICD-10-CM

## 2017-10-01 MED ORDER — CYANOCOBALAMIN 1000 MCG/ML IJ SOLN
1000.0000 ug | Freq: Once | INTRAMUSCULAR | Status: AC
Start: 2017-10-01 — End: 2017-10-01
  Administered 2017-10-01: 1000 ug via INTRAMUSCULAR

## 2017-10-02 LAB — TSH: TSH: 1.07 m[IU]/L (ref 0.40–4.50)

## 2017-10-17 ENCOUNTER — Encounter: Payer: Self-pay | Admitting: Internal Medicine

## 2017-10-17 ENCOUNTER — Ambulatory Visit (INDEPENDENT_AMBULATORY_CARE_PROVIDER_SITE_OTHER): Payer: Medicare Other | Admitting: Internal Medicine

## 2017-10-17 VITALS — BP 152/58 | HR 58 | Ht 62.0 in | Wt 136.0 lb

## 2017-10-17 DIAGNOSIS — I10 Essential (primary) hypertension: Secondary | ICD-10-CM | POA: Diagnosis not present

## 2017-10-17 DIAGNOSIS — R42 Dizziness and giddiness: Secondary | ICD-10-CM | POA: Diagnosis not present

## 2017-10-17 DIAGNOSIS — I5032 Chronic diastolic (congestive) heart failure: Secondary | ICD-10-CM

## 2017-10-17 DIAGNOSIS — I480 Paroxysmal atrial fibrillation: Secondary | ICD-10-CM | POA: Diagnosis not present

## 2017-10-17 DIAGNOSIS — Z87898 Personal history of other specified conditions: Secondary | ICD-10-CM

## 2017-10-17 NOTE — Progress Notes (Signed)
Follow-up Outpatient Visit Date: 10/17/2017  Primary Care Provider: Arnetha Courser, MD [address] [place] [address]  Chief Complaint: Follow-up paroxysmal atrial fibrillation and hypertension  HPI:  Ms. Dosanjh is a 84 y.o. year-old female with history of recurrent falls felt to be due to orthostatic hypotension and vasovagal syncope, PAF, HFpEF, HTN, CLL, vitamin B12 deficiency, and anemia, who presents for follow-up of lightheadedness and recurrent syncope.  I met Ms. Doolen in October, as she was previously followed in our practice by Dr. Yvone Neu.  At that time, Ms. Fogleman was doing well without any significant lightheadedness or falls int he preceding 6 months.  She was taking amlodipine 2.5 mg daily around lunchtime; she would also take lisinopril 2.5 mg daily if her morning SBP was >140 mmHg and 5 mg if SBP > 160 mmHg.  Today, Ms. Langner reports feeling well.  She had one episode during which it felt like her heart was beating a little bit faster and more forcefully than usual.  This occurred while she was lying in bed and resolved spontaneously after a few minutes.  She otherwise has not had any palpitations.  She also denies lightheadedness and syncope.  She has not experienced any chest pain, shortness of breath, orthopnea, or edema.  She remains on rivaroxaban without bleeding.  It is expensive for her and she wonders if it could be switched to a 90-day supply through her mail order pharmacy.  --------------------------------------------------------------------------------------------------  Cardiovascular History & Procedures: Cardiovascular Problems:  Recurrent syncope  Paroxysmal atrial fibrillation  HFpEF  Risk Factors:  Hypertension and age greater than 63  Cath/PCI:  None  CV Surgery:  None  EP Procedures and Devices:  Event monitor (06/15/16): Dominantly sinus rhythm with paroxysmal atrial fibrillation (4% burden).  No significant  pauses.  Event monitor (06/02/16): Normal sinus rhythm with first-degree AV block.  No significant arrhythmia.  Holter monitor (04/17/16): Predominantly sinus rhythm with first-degree AV block.  PACs and short atrial runs lasting up to 4 beats noted.  Rare PVCs.  Non-Invasive Evaluation(s):  TTE (04/17/16): Normal LV size.  LVEF 55-60%.  Normal wall motion and diastolic function.  Mild aortic and mitral regurgitation.  Normal RV size and function.  Normal pulmonary artery pressure.  Pharmacologic MPI (04/10/16): Low risk study with small fixed defect at the apex, likely due to breast attenuation.  No ischemia.  LVEF 55-65%.  Recent CV Pertinent Labs: Lab Results  Component Value Date   CHOL 128 12/21/2015   CHOL 141 08/24/2015   HDL 62 12/21/2015   HDL 52 08/24/2015   LDLCALC 44 12/21/2015   LDLCALC 64 08/24/2015   TRIG 111 12/21/2015   CHOLHDL 2.1 12/21/2015   K 4.5 07/09/2017   MG 2.2 12/21/2015   BUN 12 07/09/2017   BUN 9 07/19/2016   CREATININE 0.68 07/09/2017   CREATININE 0.83 04/24/2016    Past medical and surgical history were reviewed and updated in EPIC.  Current Meds  Medication Sig  . ACCU-CHEK SOFTCLIX LANCETS lancets Use as instructed; check once a day; H40.1131  . amLODipine (NORVASC) 2.5 MG tablet Take 1 tablet (2.5 mg total) by mouth daily.  Marland Kitchen atorvastatin (LIPITOR) 20 MG tablet TAKE ONE TABLET BY MOUTH AT BEDTIME  . Calcium Carb-Cholecalciferol (CALCIUM 600 + D PO) Take by mouth daily.  . cholecalciferol (VITAMIN D) 1000 units tablet Take 1,000 Units by mouth daily.  . Cyanocobalamin (B-12) 1000 MCG/ML KIT Inject 1,000 mcg as directed every 30 (thirty)  days.  . glucose blood (ACCU-CHEK AVIVA PLUS) test strip Use as instructed, check once a day; H40.1131  . ipratropium (ATROVENT) 0.03 % nasal spray Place 2 sprays into both nostrils 3 (three) times daily.  . IRON PO Take 1 tablet by mouth 2 (two) times daily.   Marland Kitchen latanoprost (XALATAN) 0.005 % ophthalmic  solution Place 1 drop into the right eye at bedtime.   Marland Kitchen levothyroxine (SYNTHROID, LEVOTHROID) 75 MCG tablet Take one pill by mouth on Mondays, Wednesdays, Fridays, and Sundays  . levothyroxine (SYNTHROID, LEVOTHROID) 88 MCG tablet Take one by mouth on Tuesdays, Thursdays, and Saturdays  . lisinopril (PRINIVIL,ZESTRIL) 5 MG tablet Take 5 mg (1 tablet) by mouth daily if Systolic CH>885. Take 2.5 mg (1/2 tablet) by mouth daily is Systolic BP> 027.  . metroNIDAZOLE (METROCREAM) 0.75 % cream Apply topically 2 (two) times daily.  . Multiple Vitamins-Minerals (MULTIVITAMIN ADULT PO) Take by mouth.  . Omega-3 Fatty Acids (FISH OIL) 1200 MG CAPS Take by mouth daily.  . rivaroxaban (XARELTO) 20 MG TABS tablet Take 1 tablet (20 mg total) by mouth daily with supper.  . timolol (TIMOPTIC) 0.5 % ophthalmic solution Place 1 drop into the right eye 2 (two) times daily.     Allergies: Other; Alphagan [brimonidine]; Pravachol [pravastatin sodium]; and Pravastatin  Social History   Tobacco Use  . Smoking status: Never Smoker  . Smokeless tobacco: Never Used  Substance Use Topics  . Alcohol use: No  . Drug use: No    Family History  Problem Relation Age of Onset  . Heart attack Mother   . Glaucoma Mother   . Heart attack Father   . Parkinson's disease Brother   . Heart attack Brother     Review of Systems: A 12-system review of systems was performed and was negative except as noted in the HPI.  --------------------------------------------------------------------------------------------------  Physical Exam: BP (!) 152/58 (BP Location: Left Arm, Patient Position: Sitting, Cuff Size: Normal)   Pulse (!) 58   Ht '5\' 2"'$  (1.575 m)   Wt 136 lb (61.7 kg)   LMP  (LMP Unknown)   BMI 24.87 kg/m  Repeat 160/64  General: NAD.  Accompanied by her daughter. HEENT: No conjunctival pallor or scleral icterus. Moist mucous membranes.  OP clear. Neck: Supple without lymphadenopathy, thyromegaly, JVD, or  HJR. Lungs: Normal work of breathing. Clear to auscultation bilaterally without wheezes or crackles. Heart: Regular rate and rhythm without murmurs, rubs, or gallops. Non-displaced PMI. Abd: Bowel sounds present. Soft, NT/ND without hepatosplenomegaly Ext: Trace ankle edema bilaterally. Radial, PT, and DP pulses are 2+ bilaterally. Skin: Warm and dry without rash.  EKG: Normal sinus rhythm with first-degree AV block (PR interval 240 ms).  Otherwise, no significant abnormalities.  Lab Results  Component Value Date   WBC 7.6 07/09/2017   HGB 11.4 (L) 07/09/2017   HCT 34.0 (L) 07/09/2017   MCV 92.5 07/09/2017   PLT 210 07/09/2017    Lab Results  Component Value Date   NA 132 (L) 07/09/2017   K 4.5 07/09/2017   CL 99 (L) 07/09/2017   CO2 26 07/09/2017   BUN 12 07/09/2017   CREATININE 0.68 07/09/2017   GLUCOSE 104 (H) 07/09/2017   ALT 18 04/21/2016    Lab Results  Component Value Date   CHOL 128 12/21/2015   HDL 62 12/21/2015   LDLCALC 44 12/21/2015   TRIG 111 12/21/2015   CHOLHDL 2.1 12/21/2015    --------------------------------------------------------------------------------------------------  ASSESSMENT AND PLAN: Paroxysmal atrial fibrillation  One brief episode of palpitations noted since her last visit.  EKG today demonstrates mild sinus bradycardia.  We will continue her current medications including indefinite anticoagulation with rivaroxaban.  We discussed switching to warfarin, due to cost, though Ms. Laramie is hesitant to make this switch due to dietary restrictions and need for monitoring while on warfarin.  Ms. Stallone will contact us if she wishes to have a new prescription for rivaroxaban sent to her mail order pharmacy with 90-day supplies.  Lightheadedness and history of syncope No further episodes over the last year.  We will continue with current medications as well as a degree of permissive hypertension.  Hypertension Blood pressure mildly elevated  today.  Home readings are similar to generally lower than today's blood pressure.  Ms. Scheidegger continues to take amlodipine 2.5 mg daily as well as lisinopril 2.5 mg daily if her systolic pressure is greater than 140 or 5 mg if her systolic blood pressures greater than 160.  I think it is reasonable to continue with this regimen, given history of orthostatic lightheadedness and syncope.  Labs in 06/2017 showed normal potassium and kidney function.  HFpEF Ms. Puccini appears euvolemic and well compensated with NYHA class II symptoms.  No medication changes at this time.  Follow-up: Return to clinic in 6 months.  Nelva Bush, MD 10/17/2017 9:17 AM

## 2017-10-17 NOTE — Patient Instructions (Signed)

## 2017-10-22 ENCOUNTER — Other Ambulatory Visit: Payer: Self-pay

## 2017-10-22 ENCOUNTER — Encounter: Payer: Self-pay | Admitting: Internal Medicine

## 2017-10-22 MED ORDER — RIVAROXABAN 20 MG PO TABS: 20.0000 mg | ORAL_TABLET | Freq: Every day | ORAL | 6 refills | Status: DC

## 2017-10-22 NOTE — Telephone Encounter (Signed)
Please review for refill, Thanks !  

## 2017-10-22 NOTE — Telephone Encounter (Signed)
Pt daughter states pt would like to switch from [facility]  90 day supply This encounter was created in error - please disregard. This encounter was created in error - please disregard.

## 2017-10-30 DIAGNOSIS — E119 Type 2 diabetes mellitus without complications: Secondary | ICD-10-CM | POA: Diagnosis not present

## 2017-10-30 DIAGNOSIS — H401131 Primary open-angle glaucoma, bilateral, mild stage: Secondary | ICD-10-CM | POA: Diagnosis not present

## 2017-11-05 ENCOUNTER — Ambulatory Visit
Admission: RE | Admit: 2017-11-05 | Discharge: 2017-11-05 | Disposition: A | Discharge status: Home / Self Care | Payer: Medicare Other | Source: Ambulatory Visit | Attending: Urgent Care | Admitting: Urgent Care

## 2017-11-05 DIAGNOSIS — I2510 Atherosclerotic heart disease of native coronary artery without angina pectoris: Secondary | ICD-10-CM | POA: Insufficient documentation

## 2017-11-05 DIAGNOSIS — R918 Other nonspecific abnormal finding of lung field: Secondary | ICD-10-CM | POA: Diagnosis not present

## 2017-11-05 DIAGNOSIS — K8020 Calculus of gallbladder without cholecystitis without obstruction: Secondary | ICD-10-CM | POA: Diagnosis not present

## 2017-11-05 DIAGNOSIS — I700 Atherosclerosis of aorta: Secondary | ICD-10-CM | POA: Diagnosis not present

## 2017-11-06 ENCOUNTER — Encounter: Payer: Self-pay | Admitting: Family Medicine

## 2017-11-06 DIAGNOSIS — E039 Hypothyroidism, unspecified: Secondary | ICD-10-CM

## 2017-11-07 ENCOUNTER — Other Ambulatory Visit: Payer: Self-pay

## 2017-11-07 MED ORDER — LEVOTHYROXINE SODIUM 88 MCG PO TABS: ORAL_TABLET | ORAL | 2 refills | Status: DC

## 2017-11-07 MED ORDER — LEVOTHYROXINE SODIUM 75 MCG PO TABS: ORAL_TABLET | ORAL | 2 refills | Status: DC

## 2017-11-07 NOTE — Telephone Encounter (Signed)
Do you have Dr. Darnelle Bos NPI #? Pharmacy will not let me call in rx w/o it.

## 2017-11-07 NOTE — Telephone Encounter (Signed)
*  STAT* If patient is at the pharmacy, call can be transferred to refill team.   1. Which medications need to be refilled? (please list name of each medication and dose if known) Xarelto   2. Which pharmacy/location (including street and city if local pharmacy) is medication to be sent to?[place] phone [phone], fax [phone] to locate in [place])  3. Do they need a 30 day or 90 day supply? 38  Pt daughter request we call to inform this has been sent

## 2017-11-07 NOTE — Telephone Encounter (Signed)
Spoke w/ Rachel Bo, pharmacy tech @ [facility]. Confirmed refill for Xarelto 20 mg #90 w/ 3 refills w/ pharmacist Rosann Auerbach.

## 2017-11-07 NOTE — Telephone Encounter (Signed)
Please review for refill on Xarelto. Thanks!  Please call daughter when finished because [facility] pharmacy is not in the [place].

## 2017-11-08 ENCOUNTER — Inpatient Hospital Stay: Payer: Medicare Other

## 2017-11-08 ENCOUNTER — Inpatient Hospital Stay (HOSPITAL_BASED_OUTPATIENT_CLINIC_OR_DEPARTMENT_OTHER): Payer: Medicare Other | Admitting: Hematology and Oncology

## 2017-11-08 ENCOUNTER — Inpatient Hospital Stay: Payer: Medicare Other | Attending: Hematology and Oncology

## 2017-11-08 ENCOUNTER — Encounter: Payer: Self-pay | Admitting: Hematology and Oncology

## 2017-11-08 VITALS — BP 163/65 | HR 53 | Temp 97.8°F | Resp 18 | Ht 62.0 in | Wt 135.3 lb

## 2017-11-08 DIAGNOSIS — E538 Deficiency of other specified B group vitamins: Secondary | ICD-10-CM

## 2017-11-08 DIAGNOSIS — C9190 Lymphoid leukemia, unspecified not having achieved remission: Secondary | ICD-10-CM

## 2017-11-08 DIAGNOSIS — Z7901 Long term (current) use of anticoagulants: Secondary | ICD-10-CM | POA: Insufficient documentation

## 2017-11-08 DIAGNOSIS — R918 Other nonspecific abnormal finding of lung field: Secondary | ICD-10-CM

## 2017-11-08 DIAGNOSIS — D649 Anemia, unspecified: Secondary | ICD-10-CM

## 2017-11-08 DIAGNOSIS — I4891 Unspecified atrial fibrillation: Secondary | ICD-10-CM

## 2017-11-08 DIAGNOSIS — C9110 Chronic lymphocytic leukemia of B-cell type not having achieved remission: Secondary | ICD-10-CM | POA: Diagnosis not present

## 2017-11-08 LAB — COMPREHENSIVE METABOLIC PANEL
ALT: 23 U/L (ref 14–54)
AST: 24 U/L (ref 15–41)
Albumin: 4 g/dL (ref 3.5–5.0)
Alkaline Phosphatase: 25 U/L — ABNORMAL LOW (ref 38–126)
Anion gap: 8 (ref 5–15)
BUN: 18 mg/dL (ref 6–20)
CO2: 26 mmol/L (ref 22–32)
Calcium: 9.4 mg/dL (ref 8.9–10.3)
Chloride: 99 mmol/L — ABNORMAL LOW (ref 101–111)
Creatinine, Ser: 0.72 mg/dL (ref 0.44–1.00)
GFR calc Af Amer: >60 mL/min (ref >60)
GFR calc non Af Amer: >60 mL/min (ref >60)
Glucose, Bld: 105 mg/dL — ABNORMAL HIGH (ref 65–99)
Potassium: 4.5 mmol/L (ref 3.5–5.1)
Sodium: 133 mmol/L — ABNORMAL LOW (ref 135–145)
Total Bilirubin: 0.9 mg/dL (ref 0.3–1.2)
Total Protein: 7.5 g/dL (ref 6.5–8.1)

## 2017-11-08 LAB — CBC WITH DIFFERENTIAL/PLATELET
Basophils Absolute: 0.1 10*3/uL (ref 0–0.1)
Basophils Relative: 1 %
Eosinophils Absolute: 0.1 10*3/uL (ref 0–0.7)
Eosinophils Relative: 2 %
HCT: 35.8 % (ref 35.0–47.0)
Hemoglobin: 12.3 g/dL (ref 12.0–16.0)
Lymphocytes Relative: 41 %
Lymphs Abs: 2.9 10*3/uL (ref 1.0–3.6)
MCH: 31.9 pg (ref 26.0–34.0)
MCHC: 34.4 g/dL (ref 32.0–36.0)
MCV: 92.7 fL (ref 80.0–100.0)
Monocytes Absolute: 0.6 10*3/uL (ref 0.2–0.9)
Monocytes Relative: 8 %
Neutro Abs: 3.4 10*3/uL (ref 1.4–6.5)
Neutrophils Relative %: 48 %
Platelets: 201 10*3/uL (ref 150–440)
RBC: 3.86 MIL/uL (ref 3.80–5.20)
RDW: 14 % (ref 11.5–14.5)
WBC: 7.1 10*3/uL (ref 3.6–11.0)

## 2017-11-08 LAB — FERRITIN: Ferritin: 64 ng/mL (ref 11–307)

## 2017-11-08 MED ORDER — CYANOCOBALAMIN 1000 MCG/ML IJ SOLN
1000.0000 ug | Freq: Once | INTRAMUSCULAR | Status: AC
  Administered 2017-11-08: 1000 ug via INTRAMUSCULAR
  Filled 2017-11-08: qty 1

## 2017-11-08 NOTE — Progress Notes (Signed)
[facility] day:  11/08/17  Chief Complaint: Sydney Mcdaniel is a 84 y.o. female with chronic lymphocytic leukemia, B12 deficiency, and anemia who is seen for review of interval CT scan and 4 month assessment.  HPI:  The patient was last seen in the hematology clinic on 07/09/2017.  At that time,  she denied any B symptoms.  Exam revealed no palpable adenopathy or hepatosplenomegaly.  Hematocrit was 34.0 and hemoglobin 11.4.  Ferritin was 59.  She has received B12 monthly (07/09/2017 - 10/01/2017).  Chest CT on 11/05/2017 revealed 6 mm ground-glass right lower lobe nodule, stable since 11/07/2016.  Follow-up in 2 years was recommended.  There were millimetric pulmonary nodules unchanged and considered benign.  There was aortic atherosclerosis and extensive three-vessel coronary artery calcification.  There were cholelithiasis with no evidence of cholecystitis.  During the interm, patient is fatigued.  She denies any acute concerns. Patient denies bleeding; no hematochezia, melena, or gross hematuria. Patient has areas of bruising related to known blunt trauma. She is on oral anticoagulation.   Patient denies shortness of breath. She has not experienced any B symptoms or interval infections. Patient is eating well. Weight down 1 pound.   Patient denies pain in the clinic today.    Past Medical History:  Diagnosis Date  . Allergy   . Anemia   . Cataract   . Glaucoma   . H/O: hysterectomy    Total  . Hyperlipidemia   . Hypertension   . Hypothyroidism   . Impaired fasting glucose   . Lichen sclerosus   . Osteoporosis    Hips  . Syncope    a. 03/2016 Echo: EF55-60%, no rwma, mild AI/MR, nl PASP; b. 03/2016 48h Holter: no significant arrhythmias/pauses; c. 03/2016 MV: mild apical defect, likely breast attenuation, nl EF, low risk.    Past Surgical History:  Procedure Laterality Date  . APPENDECTOMY    . EYE SURGERY     Glaucoma  .  TOTAL ABDOMINAL HYSTERECTOMY      Family History  Problem Relation Age of Onset  . Heart attack Mother   . Glaucoma Mother   . Heart attack Father   . Parkinson's disease Brother   . Heart attack Brother     Social History:  reports that she has never smoked. She has never used smokeless tobacco. She reports that she does not drink alcohol or use drugs.  Her husband died in Jan 20, 2015.  She notes that she played basketball 75 years ago on a half court.  She lives in [place].  She is accompanied by her daughter, [name].  Allergies:  Allergies  Allergen Reactions  . Other   . Alphagan [Brimonidine] Itching and Rash  . Pravachol [Pravastatin Sodium] Itching and Rash  . Pravastatin Itching, Other (See Comments) and Rash    Current Medications: Current Outpatient Medications  Medication Sig Dispense Refill  . ACCU-CHEK SOFTCLIX LANCETS lancets Use as instructed; check once a day; H40.1131 100 each 12  . amLODipine (NORVASC) 2.5 MG tablet Take 1 tablet (2.5 mg total) by mouth daily. 90 tablet 3  . atorvastatin (LIPITOR) 20 MG tablet TAKE ONE TABLET BY MOUTH AT BEDTIME 90 tablet 3  . Calcium Carb-Cholecalciferol (CALCIUM 600 + D PO) Take by mouth daily.    . cholecalciferol (VITAMIN D) 1000 units tablet Take 1,000 Units by mouth daily.    . Cyanocobalamin (B-12) 1000 MCG/ML KIT Inject 1,000 mcg as directed every 30 (  thirty) days.    Marland Kitchen glucose blood (ACCU-CHEK AVIVA PLUS) test strip Use as instructed, check once a day; H40.1131 100 each 3  . IRON PO Take 1 tablet by mouth 2 (two) times daily.     Marland Kitchen latanoprost (XALATAN) 0.005 % ophthalmic solution Place 1 drop into the right eye at bedtime.     Marland Kitchen levothyroxine (SYNTHROID, LEVOTHROID) 75 MCG tablet Take one pill by mouth on Mondays, Wednesdays, Fridays, and Sundays 52 tablet 2  . levothyroxine (SYNTHROID, LEVOTHROID) 88 MCG tablet Take one by mouth on Tuesdays, Thursdays, and Saturdays 39 tablet 2  . lisinopril (PRINIVIL,ZESTRIL) 5 MG  tablet Take 5 mg (1 tablet) by mouth daily if Systolic [id number]. Take 2.5 mg (1/2 tablet) by mouth daily is Systolic BP> 191. 90 tablet 3  . Multiple Vitamins-Minerals (MULTIVITAMIN ADULT PO) Take by mouth.    . Omega-3 Fatty Acids (FISH OIL) 1200 MG CAPS Take by mouth daily.    . rivaroxaban (XARELTO) 20 MG TABS tablet Take 1 tablet (20 mg total) by mouth daily with supper. 30 tablet 6  . timolol (TIMOPTIC) 0.5 % ophthalmic solution Place 1 drop into the right eye 2 (two) times daily.     Marland Kitchen ipratropium (ATROVENT) 0.03 % nasal spray Place 2 sprays into both nostrils 3 (three) times daily. (Patient not taking: Reported on 11/08/2017) 90 mL 3  . metroNIDAZOLE (METROCREAM) 0.75 % cream Apply topically 2 (two) times daily. (Patient not taking: Reported on 11/08/2017) 45 g 0   No current facility-administered medications for this visit.     Review of Systems  Constitutional: Positive for malaise/fatigue and weight loss (down 1 pound). Negative for diaphoresis and fever.  HENT: Negative.   Eyes: Negative for pain and redness.       Glaucoma  Respiratory: Positive for cough (at night). Negative for hemoptysis, sputum production and shortness of breath.   Cardiovascular: Negative for chest pain, palpitations, orthopnea, leg swelling and PND.       Atrial fibrillation  Gastrointestinal: Negative for abdominal pain, blood in stool, constipation, diarrhea, melena, nausea and vomiting.  Genitourinary: Negative for dysuria, frequency, hematuria and urgency.  Musculoskeletal: Negative for back pain, falls, joint pain and myalgias.  Skin: Negative for itching and rash.  Neurological: Negative for dizziness, tremors, weakness and headaches.  Endo/Heme/Allergies: Does not bruise/bleed easily.  Psychiatric/Behavioral: Negative for depression, memory loss and suicidal ideas. The patient is not nervous/anxious and does not have insomnia.   All other systems reviewed and are negative.  Physical Exam: Blood  pressure (!) 163/65, pulse (!) 53, temperature 97.8 F (36.6 C), temperature source Tympanic, resp. rate 18, height '5\' 2"'  (1.575 m), weight 135 lb 4.8 oz (61.4 kg), SpO2 99 %. GENERAL:  Well developed, well nourished, elderly woman sitting comfortably in the exam room in no acute distress. She requires assistance. MENTAL STATUS:  Alert and oriented to person, place and time. HEAD:  Curly gray hair.  Normocephalic, atraumatic, face symmetric, no Cushingoid features. EYES:  Blue eyes.  Pupils equal round and reactive to light and accomodation.  No conjunctivitis or scleral icterus. ENT:  Oropharynx clear without lesion.  Tongue normal. Mucous membranes moist.  RESPIRATORY:  Clear to auscultation without rales, wheezes or rhonchi. CARDIOVASCULAR:  Regular rate and rhythm without murmur, rub or gallop. ABDOMEN:  Soft, non-tender, with active bowel sounds, and no hepatosplenomegaly.  No masses. SKIN:  No rashes, ulcers or lesions. EXTREMITIES: No edema, no skin discoloration or tenderness.  No palpable cords. LYMPH  NODES: No palpable cervical, supraclavicular, axillary or inguinal adenopathy  NEUROLOGICAL: Unremarkable. PSYCH:  Appropriate.    Appointment on 11/08/2017  Component Date Value Ref Range Status  . Sodium 11/08/2017 133* 135 - 145 mmol/L Final  . Potassium 11/08/2017 4.5  3.5 - 5.1 mmol/L Final  . Chloride 11/08/2017 99* 101 - 111 mmol/L Final  . CO2 11/08/2017 26  22 - 32 mmol/L Final  . Glucose, Bld 11/08/2017 105* 65 - 99 mg/dL Final  . BUN 11/08/2017 18  6 - 20 mg/dL Final  . Creatinine, Ser 11/08/2017 0.72  0.44 - 1.00 mg/dL Final  . Calcium 11/08/2017 9.4  8.9 - 10.3 mg/dL Final  . Total Protein 11/08/2017 7.5  6.5 - 8.1 g/dL Final  . Albumin 11/08/2017 4.0  3.5 - 5.0 g/dL Final  . AST 11/08/2017 24  15 - 41 U/L Final  . ALT 11/08/2017 23  14 - 54 U/L Final  . Alkaline Phosphatase 11/08/2017 25* 38 - 126 U/L Final  . Total Bilirubin 11/08/2017 0.9  0.3 - 1.2 mg/dL Final   . GFR calc non Af Amer 11/08/2017 >60  >60 mL/min Final  . GFR calc Af Amer 11/08/2017 >60  >60 mL/min Final   Comment: (NOTE) The eGFR has been calculated using the CKD EPI equation. This calculation has not been validated in all clinical situations. eGFR's persistently <60 mL/min signify possible Chronic Kidney Disease.   Georgiann Hahn gap 11/08/2017 8  5 - 15 Final   Performed at [facility], [address] [place], [place] [address]  . WBC 11/08/2017 7.1  3.6 - 11.0 K/uL Final  . RBC 11/08/2017 3.86  3.80 - 5.20 MIL/uL Final  . Hemoglobin 11/08/2017 12.3  12.0 - 16.0 g/dL Final  . HCT 11/08/2017 35.8  35.0 - 47.0 % Final  . MCV 11/08/2017 92.7  80.0 - 100.0 fL Final  . MCH 11/08/2017 31.9  26.0 - 34.0 pg Final  . MCHC 11/08/2017 34.4  32.0 - 36.0 g/dL Final  . RDW 11/08/2017 14.0  11.5 - 14.5 % Final  . Platelets 11/08/2017 201  150 - 440 K/uL Final  . Neutrophils Relative % 11/08/2017 48  % Final  . Neutro Abs 11/08/2017 3.4  1.4 - 6.5 K/uL Final  . Lymphocytes Relative 11/08/2017 41  % Final  . Lymphs Abs 11/08/2017 2.9  1.0 - 3.6 K/uL Final  . Monocytes Relative 11/08/2017 8  % Final  . Monocytes Absolute 11/08/2017 0.6  0.2 - 0.9 K/uL Final  . Eosinophils Relative 11/08/2017 2  % Final  . Eosinophils Absolute 11/08/2017 0.1  0 - 0.7 K/uL Final  . Basophils Relative 11/08/2017 1  % Final  . Basophils Absolute 11/08/2017 0.1  0 - 0.1 K/uL Final   Performed at [facility], [address], [place], [place] [address]    Assessment:  BIANNCA SCANTLIN is a 84 y.o. female with stage 0 CLL.  She presented with a distant history of anemia and recent lymphocytosis.  She was noted to have mild lymphocystosis in 06/22/2015.  Absolute lymphocyte count (ALC) has ranged between 4700 - 5500.  Work-up on 01/10/2016 revealed a hematocrit of 31.6, hemoglobin 11.4, MCV 89, platelets 203,000, WBC 11,00 with an ANC of 3700.  Absolute lymphocyte count was 6400.  Ferritin was 116.  B12  was 119 (low).  Folate was 44.0.  Retic was 0.9% (low).  Uric acid was 4.5.  Guaiac cards were negative x 3 in 12/2015.  She  was diagnosed with B12 deficiency.  She receives B12 monthly (last 06/06/2017).  Folate was 38 on 08/07/2016.  Peripheral blood flow cytometry revealed involvement by a CD5+, CD23+, CD38- monoclonal B cell population with lambda light chain restriction consistent with CLL/SLL.  There was a small population (8% of the T cells) of double positive (CD4+/CD8+) T cells (described in association with chronic viral infections, autoimmune disorders, chronic inflammatory disorders, and immunodeficiency states).   She has a slight progressive normocytic anemia.  Work-up on 08/07/2016 revealed the following normal labs: creatinine, LDH, uric acid, ferritin (105), iron studies (12% sat; TIBC 295), and folate.  Retic was 1.1% (low for level of anemia).  Coombs was negative on 09/04/2016.  Diet is modest.  She denies any melena or hematochezia.  Guaiac cards were negative x 3 in 07/2016.  Last colonoscopy was > 10 years ago.  Ferritin has been followed: 116 on 01/10/2016, 105 on 08/07/2016, 29 on 11/27/2016, 31 on 03/06/2017, 59 on 07/09/2017, and 64 on 11/08/2017.  She was diagnosed with atrial fibrillation after presenting with syncopal episodes.  She is on Xarelto.  Chest CT on 11/07/2016 revealed two 2 mm right lung nodules (right major fissure and lateral aspect RUL).  There was a 5 mm right lower lobe ground-glass nodule.  Chest CT on 11/05/2017 revealed 6 mm ground-glass right lower lobe nodule, stable since 11/07/2016.  Follow-up in 2 years was recommended.  There were millimetric pulmonary nodules unchanged and considered benign.   Symptomatically, she denies any B symptoms.  Exam reveals no palpable adenopathy or hepatosplenomegaly.  Hematocrit is 35.8 and hemoglobin 12.3.  Plan: 1.  Labs today:  CBC with diff, BMP, ferritin. 2.  Discuss interval chest CT-  stable, likely benign.   Discuss chest CT in 2 years. 3.  Discuss continued observation without intervention for CLL. 4.  B12 today and monthly x 5 for B12. 5.  RTC in 6 months for MD assessment, labs (CBC with diff, BMP, ferritin), and B12.   Honor Loh, NP  11/08/2017, 11:26 AM   I saw and evaluated the patient, participating in the key portions of the service and reviewing pertinent diagnostic studies and records.  I reviewed the nurse practitioner's note and agree with the findings and the plan.  The assessment and plan were discussed with the patient.  A few questions were asked by the patient and answered.   Lequita Asal, MD 11/08/2017, 11:26 AM

## 2017-11-08 NOTE — Progress Notes (Signed)
No new changes noted today 

## 2017-12-06 ENCOUNTER — Ambulatory Visit: Payer: Medicare Other

## 2017-12-10 ENCOUNTER — Other Ambulatory Visit: Payer: Self-pay | Admitting: *Deleted

## 2017-12-10 ENCOUNTER — Inpatient Hospital Stay: Payer: Medicare Other | Attending: Hematology and Oncology

## 2017-12-10 DIAGNOSIS — E538 Deficiency of other specified B group vitamins: Secondary | ICD-10-CM | POA: Insufficient documentation

## 2017-12-10 DIAGNOSIS — I951 Orthostatic hypotension: Secondary | ICD-10-CM

## 2017-12-10 DIAGNOSIS — I480 Paroxysmal atrial fibrillation: Secondary | ICD-10-CM

## 2017-12-10 DIAGNOSIS — I10 Essential (primary) hypertension: Secondary | ICD-10-CM

## 2017-12-10 MED ORDER — AMLODIPINE BESYLATE 2.5 MG PO TABS: 2.5000 mg | ORAL_TABLET | Freq: Every day | ORAL | 2 refills | Status: DC

## 2017-12-10 MED ORDER — CYANOCOBALAMIN 1000 MCG/ML IJ SOLN
1000.0000 ug | Freq: Once | INTRAMUSCULAR | Status: AC
  Administered 2017-12-10: 1000 ug via INTRAMUSCULAR

## 2017-12-23 ENCOUNTER — Encounter: Payer: Self-pay | Admitting: Family Medicine

## 2017-12-25 ENCOUNTER — Telehealth: Payer: Self-pay | Admitting: Internal Medicine

## 2017-12-25 NOTE — Telephone Encounter (Signed)
Patient daughter [name] calling in regards to 1 year follow up  States patient is doing fine and does not need follow up Recall deleted

## 2017-12-28 NOTE — Telephone Encounter (Signed)
OK 

## 2018-01-03 ENCOUNTER — Ambulatory Visit: Payer: Medicare Other

## 2018-01-07 ENCOUNTER — Inpatient Hospital Stay: Payer: Medicare Other | Attending: Hematology and Oncology

## 2018-01-07 DIAGNOSIS — E538 Deficiency of other specified B group vitamins: Secondary | ICD-10-CM | POA: Diagnosis not present

## 2018-01-07 MED ORDER — CYANOCOBALAMIN 1000 MCG/ML IJ SOLN
1000.0000 ug | Freq: Once | INTRAMUSCULAR | Status: AC
  Administered 2018-01-07: 1000 ug via INTRAMUSCULAR

## 2018-01-18 ENCOUNTER — Encounter: Payer: Self-pay | Admitting: Internal Medicine

## 2018-01-22 ENCOUNTER — Encounter: Payer: Self-pay | Admitting: Internal Medicine

## 2018-01-22 ENCOUNTER — Telehealth: Payer: Self-pay | Admitting: *Deleted

## 2018-01-22 MED ORDER — RIVAROXABAN 20 MG PO TABS: 20.0000 mg | ORAL_TABLET | Freq: Every day | ORAL | 3 refills | Status: DC

## 2018-01-22 NOTE — Telephone Encounter (Signed)
Patient had sent an email regarding her Xarelto and the price of the medication going up. Called and s/w daughter, ok per DPR. Said patient is in the donut hole. They tried applying for patient assistance last year and patient did not qualify. She looked online this year and it appears she does not qualify though she did not talk to anyone directly. Advised daughter to call [phone] to speak with someone concerning patient assistance to see if she qualifies. They also wanted Rx sent to [facility] to see how much it would cost patient there. Rx sent. Daughter will let us know the outcome and if further assistance is needed. Patient has enough pills at this time.

## 2018-01-24 ENCOUNTER — Encounter: Payer: Self-pay | Admitting: Internal Medicine

## 2018-01-31 ENCOUNTER — Ambulatory Visit: Payer: Medicare Other

## 2018-02-04 ENCOUNTER — Inpatient Hospital Stay: Payer: Medicare Other | Attending: Hematology and Oncology

## 2018-02-04 DIAGNOSIS — E538 Deficiency of other specified B group vitamins: Secondary | ICD-10-CM | POA: Diagnosis not present

## 2018-02-04 MED ORDER — CYANOCOBALAMIN 1000 MCG/ML IJ SOLN
1000.0000 ug | Freq: Once | INTRAMUSCULAR | Status: AC
  Administered 2018-02-04: 1000 ug via INTRAMUSCULAR

## 2018-02-28 ENCOUNTER — Ambulatory Visit: Payer: Medicare Other

## 2018-03-04 ENCOUNTER — Emergency Department
Admission: EM | Admit: 2018-03-04 | Discharge: 2018-03-04 | Disposition: A | Discharge status: Home / Self Care | Payer: Medicare Other | Attending: Emergency Medicine | Admitting: Emergency Medicine

## 2018-03-04 ENCOUNTER — Inpatient Hospital Stay: Payer: Medicare Other | Attending: Hematology and Oncology

## 2018-03-04 ENCOUNTER — Emergency Department: Payer: Medicare Other

## 2018-03-04 ENCOUNTER — Encounter: Payer: Self-pay | Admitting: Family Medicine

## 2018-03-04 ENCOUNTER — Other Ambulatory Visit: Payer: Self-pay

## 2018-03-04 ENCOUNTER — Encounter: Payer: Self-pay | Admitting: Emergency Medicine

## 2018-03-04 DIAGNOSIS — Y999 Unspecified external cause status: Secondary | ICD-10-CM | POA: Diagnosis not present

## 2018-03-04 DIAGNOSIS — I110 Hypertensive heart disease with heart failure: Secondary | ICD-10-CM | POA: Insufficient documentation

## 2018-03-04 DIAGNOSIS — Z79899 Other long term (current) drug therapy: Secondary | ICD-10-CM | POA: Insufficient documentation

## 2018-03-04 DIAGNOSIS — Y9301 Activity, walking, marching and hiking: Secondary | ICD-10-CM | POA: Insufficient documentation

## 2018-03-04 DIAGNOSIS — Z7901 Long term (current) use of anticoagulants: Secondary | ICD-10-CM | POA: Insufficient documentation

## 2018-03-04 DIAGNOSIS — E039 Hypothyroidism, unspecified: Secondary | ICD-10-CM | POA: Insufficient documentation

## 2018-03-04 DIAGNOSIS — S0083XA Contusion of other part of head, initial encounter: Secondary | ICD-10-CM | POA: Diagnosis not present

## 2018-03-04 DIAGNOSIS — I5032 Chronic diastolic (congestive) heart failure: Secondary | ICD-10-CM | POA: Insufficient documentation

## 2018-03-04 DIAGNOSIS — S61211A Laceration without foreign body of left index finger without damage to nail, initial encounter: Secondary | ICD-10-CM | POA: Insufficient documentation

## 2018-03-04 DIAGNOSIS — I2510 Atherosclerotic heart disease of native coronary artery without angina pectoris: Secondary | ICD-10-CM | POA: Insufficient documentation

## 2018-03-04 DIAGNOSIS — E538 Deficiency of other specified B group vitamins: Secondary | ICD-10-CM | POA: Insufficient documentation

## 2018-03-04 DIAGNOSIS — I6523 Occlusion and stenosis of bilateral carotid arteries: Secondary | ICD-10-CM | POA: Diagnosis not present

## 2018-03-04 DIAGNOSIS — W108XXA Fall (on) (from) other stairs and steps, initial encounter: Secondary | ICD-10-CM | POA: Diagnosis not present

## 2018-03-04 DIAGNOSIS — Y92008 Other place in unspecified non-institutional (private) residence as the place of occurrence of the external cause: Secondary | ICD-10-CM | POA: Diagnosis not present

## 2018-03-04 DIAGNOSIS — W19XXXA Unspecified fall, initial encounter: Secondary | ICD-10-CM | POA: Diagnosis not present

## 2018-03-04 DIAGNOSIS — S0990XA Unspecified injury of head, initial encounter: Secondary | ICD-10-CM | POA: Diagnosis not present

## 2018-03-04 DIAGNOSIS — S0993XA Unspecified injury of face, initial encounter: Secondary | ICD-10-CM | POA: Diagnosis not present

## 2018-03-04 HISTORY — DX: Chronic lymphocytic leukemia of B-cell type not having achieved remission: C91.10

## 2018-03-04 MED ORDER — CYANOCOBALAMIN 1000 MCG/ML IJ SOLN
1000.0000 ug | Freq: Once | INTRAMUSCULAR | Status: AC
  Administered 2018-03-04: 1000 ug via INTRAMUSCULAR
  Filled 2018-03-04: qty 1

## 2018-03-04 NOTE — ED Triage Notes (Signed)
Pt tripped and fell and hit left cheek. Bruising noted.  Pt on xarelto and daughter called EMS because they told her she needs to get a CT scan if hits head.  Alert and oriented. NAD

## 2018-03-04 NOTE — ED Provider Notes (Addendum)
[facility] Emergency Department Provider Note ____________________________________________   I have reviewed the triage vital signs and the triage nursing note.  HISTORY  Chief Complaint Fall   Historian Patient  HPI Sydney Mcdaniel is a 84 y.o. female presents from home after a fall with facial injury where she struck her left maxilla area and there is a bruise.  Patient states that she had had a good morning and then went out to breakfast into the grocery and then came home and stopped at the mailbox and turn the car off.  The car would not restart and therefore they had to walk up the driveway to the house.  As she was going up the steps her toe caught and she fell striking her left cheek.  No neck pain.  No extremity pain.  She is on Xarelto with a history of atrial fibrillation.  It sounds like after calling their doctor she was told to come to the ED for evaluation secondary to striking her head while on blood thinner.  No headache.  She has had history of syncopal episodes, but today this was not related to a syncopal episode.    Past Medical History:  Diagnosis Date  . Allergy   . Anemia   . Atrial fibrillation ([place])   . Cataract   . CLL (chronic lymphocytic leukemia) ([place])   . Glaucoma   . H/O: hysterectomy    Total  . Hyperlipidemia   . Hypertension   . Hypothyroidism   . Impaired fasting glucose   . Lichen sclerosus   . Osteoporosis    Hips  . Syncope    a. 03/2016 Echo: EF55-60%, no rwma, mild AI/MR, nl PASP; b. 03/2016 48h Holter: no significant arrhythmias/pauses; c. 03/2016 MV: mild apical defect, likely breast attenuation, nl EF, low risk.    Patient Active Problem List   Diagnosis Date Noted  . History of syncope 10/17/2017  . Lightheadedness 10/17/2017  . Lung nodules 07/09/2017  . Orthostatic lightheadedness 04/18/2017  . Paroxysmal atrial fibrillation ([place]) 04/18/2017  . Pleural effusion 10/20/2016  . Hyponatremia  10/20/2016  . Chronic diastolic congestive heart failure ([place]) 06/28/2016  . Osteoporosis screening 06/22/2016  . Breast cancer screening 06/22/2016  . Syncope, near 05/24/2016  . Chest pain 05/24/2016  . Primary open-angle glaucoma, bilateral, mild stage 05/08/2016  . Labile blood glucose 04/27/2016  . Syncope 03/22/2016  . Bladder prolapse, female, acquired 02/25/2016  . Urinary tract infection 02/25/2016  . 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  . B12 deficiency 01/11/2016  . Anemia 01/10/2016  . CLL (chronic lymphocytic leukemia) ([place]) 01/10/2016  . Hyperkalemia 12/22/2015  . 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 12/21/2015  . Bradycardia 12/21/2015  . Lymphocytosis 06/25/2015  . Preventative health care 06/22/2015  . Impaired fasting glucose 06/22/2015  . Medication monitoring encounter 06/22/2015  . Nocturia 06/22/2015  . Carotid atherosclerosis 06/22/2015  . Essential hypertension, benign 12/22/2014  . Hypercholesteremia 12/22/2014  . Hypothyroidism 12/22/2014    Past Surgical History:  Procedure Laterality Date  . APPENDECTOMY    . EYE SURGERY     Glaucoma  . TOTAL ABDOMINAL HYSTERECTOMY      Prior to Admission medications   Medication Sig Start Date End Date Taking? Authorizing Provider  ACCU-CHEK SOFTCLIX LANCETS lancets Use as instructed; check once a day; H40.1131 05/08/16   Arnetha Courser, MD  amLODipine (NORVASC) 2.5 MG tablet Take 1 tablet (2.5  mg total) by mouth daily. 12/10/17   End, Harrell Gave, MD  atorvastatin (LIPITOR) 20 MG tablet TAKE ONE TABLET BY MOUTH AT BEDTIME 06/07/17   Lada, Satira Anis, MD  Calcium Carb-Cholecalciferol (CALCIUM 600 + D PO) Take by mouth daily.    [provider]  cholecalciferol (VITAMIN D) 1000 units tablet Take 1,000 Units by mouth daily.    [provider]  Cyanocobalamin (B-12) 1000 MCG/ML KIT Inject 1,000 mcg as directed every 30 (thirty) days.     [provider]  glucose blood (ACCU-CHEK AVIVA PLUS) test strip Use as instructed, check once a day; H40.1131 05/08/16   Lada, Satira Anis, MD  ipratropium (ATROVENT) 0.03 % nasal spray Place 2 sprays into both nostrils 3 (three) times daily. Patient not taking: Reported on 11/08/2017 12/21/15   Arnetha Courser, MD  IRON PO Take 1 tablet by mouth 2 (two) times daily.     [provider]  latanoprost (XALATAN) 0.005 % ophthalmic solution Place 1 drop into the right eye at bedtime.  04/24/15   [provider]  levothyroxine Wilmer Floor, LEVOTHROID) 75 MCG tablet Take one pill by mouth on Mondays, Wednesdays, Fridays, and Sundays 11/07/17   Arnetha Courser, MD  levothyroxine (SYNTHROID, LEVOTHROID) 88 MCG tablet Take one by mouth on Tuesdays, Thursdays, and Saturdays 11/07/17   Arnetha Courser, MD  lisinopril (PRINIVIL,ZESTRIL) 5 MG tablet Take 5 mg (1 tablet) by mouth daily if Systolic CH>852. Take 2.5 mg (1/2 tablet) by mouth daily is Systolic BP> 778. 24/23/53   End, Harrell Gave, MD  metroNIDAZOLE (METROCREAM) 0.75 % cream Apply topically 2 (two) times daily. Patient not taking: Reported on 11/08/2017 09/19/17   Arnetha Courser, MD  Multiple Vitamins-Minerals (MULTIVITAMIN ADULT PO) Take by mouth.    [provider]  Omega-3 Fatty Acids (FISH OIL) 1200 MG CAPS Take by mouth daily.    [provider]  rivaroxaban (XARELTO) 20 MG TABS tablet Take 1 tablet (20 mg total) by mouth daily with supper. 01/22/18   End, Harrell Gave, MD  timolol (TIMOPTIC) 0.5 % ophthalmic solution Place 1 drop into the right eye 2 (two) times daily.  04/24/15   [provider]    Allergies  Allergen Reactions  . Other   . Alphagan [Brimonidine] Itching and Rash  . Pravachol [Pravastatin Sodium] Itching and Rash  . Pravastatin Itching, Other (See Comments) and Rash    Family History  Problem Relation Age of Onset  . Heart attack Mother   . Glaucoma Mother   . Heart  attack Father   . Parkinson's disease Brother   . Heart attack Brother     Social History Social History   Tobacco Use  . Smoking status: Never Smoker  . Smokeless tobacco: Never Used  Substance Use Topics  . Alcohol use: No  . Drug use: No    Review of Systems  Constitutional: Negative for recent illness. Eyes: Negative for visual changes. ENT: Negative for sore throat.  Facial swelling left cheek. Cardiovascular: Negative for chest pain. Respiratory: Negative for shortness of breath. Gastrointestinal: Negative for abdominal pain, vomiting and diarrhea. Genitourinary: Negative for dysuria. Musculoskeletal: Negative for back pain. Skin: Negative for rash. Neurological: Negative for headache.  ____________________________________________   PHYSICAL EXAM:  VITAL SIGNS: ED Triage Vitals  Enc Vitals Group     BP 03/04/18 1344 (!) 205/66     Pulse Rate 03/04/18 1344 62     Resp 03/04/18 1344 18     Temp 03/04/18  1344 98.3 F (36.8 C)     Temp Source 03/04/18 1344 Oral     SpO2 03/04/18 1344 96 %     Weight 03/04/18 1339 134 lb 7.7 oz (61 kg)     Height --      Head Circumference --      Peak Flow --      Pain Score 03/04/18 1339 0     Pain Loc --      Pain Edu? --      Excl. in [place]? --      Constitutional: Alert and oriented.  HEENT      Head: Normocephalic.  No obvious scalp trauma noted.  Facial swelling to left maxilla area.      Eyes: Conjunctivae are normal. Pupils equal and round.       Ears:         Nose: No congestion/rhinnorhea.      Mouth/Throat: Mucous membranes are moist.      Neck: No stridor.  Nontender posterior midline cspine. Cardiovascular/Chest: Normal rate, regular rhythm.  No murmurs, rubs, or gallops. Respiratory: Normal respiratory effort without tachypnea nor retractions. Breath sounds are clear and equal bilaterally. No wheezes/rales/rhonchi. Gastrointestinal: Soft. No distention, no guarding, no rebound. Nontender.     Genitourinary/rectal:Deferred Musculoskeletal: Nontender with normal range of motion in all extremities. No joint effusions.  No lower extremity tenderness.  No edema. Neurologic:  Normal speech and language. No gross or focal neurologic deficits are appreciated. Skin:  Skin is warm, dry and intact. No rash noted. Psychiatric: Mood and affect are normal. Speech and behavior are normal. Patient exhibits appropriate insight and judgment.   ____________________________________________  LABS (pertinent positives/negatives) I, Lisa Roca, MD the attending physician have reviewed the labs noted below.  Labs Reviewed - No data to display  ____________________________________________    EKG I, Lisa Roca, MD, the attending physician have personally viewed and interpreted all ECGs.  None ____________________________________________  RADIOLOGY   CT head without contrast and maxillofacial without contrast, radiologist report reviewed:IMPRESSION: 1. No acute intracranial abnormality 2. Negative for facial fracture 3. Extensive atherosclerotic calcification carotid artery bilaterally 4. Bruising left maxillary soft tissues __________________________________________  PROCEDURES  Procedure(s) performed: None  Procedures   Dermabond application to left index finger laceration.  Performed by me.  No complications.  Critical Care performed: None   ____________________________________________  ED COURSE / ASSESSMENT AND PLAN  Pertinent labs & imaging results that were available during my care of the patient were reviewed by me and considered in my medical decision making (see chart for details).    Patient arrived for evaluation of facial injury after mechanical fall while on Xarelto.  No additional injuries suspected based on history physical exam.  C-spine cleared clinically.  CT scans are reassuring for no acute traumatic injury.  We discussed likely going to have a  large bruise to the face.  Addended to include, patient failed initially to show me that she has a small laceration to the ventral surface of the left index finger which has no bony point tenderness.  She is up-to-date on tetanus shot.  Small amount of Dermabond was applied after washing.   CONSULTATIONS: None   Patient / Family / Caregiver informed of clinical course, medical decision-making process, and agree with plan.   I discussed return precautions, follow-up instructions, and discharge instructions with patient and/or family.  Discharge Instructions :  You are evaluated for facial injury, no severe injury is suspected.  No underlying fracture or  head bleed were found.  You will have a large bruise to your left cheek and possibly a "black eye ".  Return to the emergency room immediately for any worsening condition including any vision changes, neck pain, headache, confusion altered mental status, or any other symptoms concerning to you.  You CT scan showed hardening of the arteries called atherosclerosis of the carotid arteries- a risk factor for heart attack and stroke.  Please just discuss risk factor management and follow up with your primary care doctor.     ___________________________________________   FINAL CLINICAL IMPRESSION(S) / ED DIAGNOSES   Final diagnoses:  Contusion of face, initial encounter  Carotid atherosclerosis, bilateral  Laceration of left index finger w/o foreign body w/o damage to nail, initial encounter      ___________________________________________         Note: This dictation was prepared with Dragon dictation. Any transcriptional errors that result from this process are unintentional    Lisa Roca, MD 03/04/18 1534    Lisa Roca, MD 03/04/18 1551    Lisa Roca, MD 03/04/18 [phone]

## 2018-03-04 NOTE — Discharge Instructions (Addendum)
You are evaluated for facial injury, no severe injury is suspected.  No underlying fracture or head bleed were found.  You will have a large bruise to your left cheek and possibly a "black eye ".  Return to the emergency room immediately for any worsening condition including any vision changes, neck pain, headache, confusion altered mental status, or any other symptoms concerning to you.  You CT scan showed hardening of the arteries called atherosclerosis of the carotid arteries- a risk factor for heart attack and stroke.  Please just discuss risk factor management and follow up with your primary care doctor.

## 2018-03-12 ENCOUNTER — Encounter

## 2018-03-12 ENCOUNTER — Ambulatory Visit: Payer: Self-pay | Admitting: Nurse Practitioner

## 2018-03-12 ENCOUNTER — Ambulatory Visit (INDEPENDENT_AMBULATORY_CARE_PROVIDER_SITE_OTHER): Payer: Medicare Other | Admitting: Nurse Practitioner

## 2018-03-12 ENCOUNTER — Encounter: Payer: Self-pay | Admitting: Nurse Practitioner

## 2018-03-12 VITALS — BP 150/60 | HR 68 | Ht 60.0 in | Wt 137.5 lb

## 2018-03-12 DIAGNOSIS — R55 Syncope and collapse: Secondary | ICD-10-CM | POA: Diagnosis not present

## 2018-03-12 DIAGNOSIS — I5032 Chronic diastolic (congestive) heart failure: Secondary | ICD-10-CM

## 2018-03-12 DIAGNOSIS — R42 Dizziness and giddiness: Secondary | ICD-10-CM | POA: Diagnosis not present

## 2018-03-12 DIAGNOSIS — I6523 Occlusion and stenosis of bilateral carotid arteries: Secondary | ICD-10-CM | POA: Diagnosis not present

## 2018-03-12 DIAGNOSIS — I480 Paroxysmal atrial fibrillation: Secondary | ICD-10-CM | POA: Diagnosis not present

## 2018-03-12 DIAGNOSIS — I10 Essential (primary) hypertension: Secondary | ICD-10-CM

## 2018-03-12 NOTE — Progress Notes (Signed)
Office Visit    Patient Name: Sydney Mcdaniel Date of Encounter: 03/12/2018  Primary Care Provider:  Arnetha Courser, MD Primary Cardiologist:  Nelva Bush, MD  Chief Complaint    84 year old female with a history of paroxysmal atrial fibrillation, HFpEF, hypertension, CLL, vitamin B12 deficiency, and anemia, presents for follow-up related to orthostatic hypotension and vasovagal syncope.  Past Medical History    Past Medical History:  Diagnosis Date  . Allergy   . Anemia   . Cataract   . CLL (chronic lymphocytic leukemia) ([place])   . Glaucoma   . H/O: hysterectomy    Total  . Hyperlipidemia   . Hypertension   . Hypothyroidism   . Impaired fasting glucose   . Lichen sclerosus   . Osteoporosis    Hips  . PAF (paroxysmal atrial fibrillation) ([place])    a. 06/15/2016 Event monitor: 4% afib burden; b. CHA2DS2VASc - 4-->Xarelto.  . Syncope    a. 03/2016 Echo: EF55-60%, no rwma, mild AI/MR, nl PASP; b. 03/2016 48h Holter: no significant arrhythmias/pauses; c. 03/2016 MV: mild apical defect, likely breast attenuation, nl EF, low risk; d. 05/2016 Event monitor: No significant arrhythmia; e. 05/2016 Event monitor: PAF (4%).   Past Surgical History:  Procedure Laterality Date  . APPENDECTOMY    . EYE SURGERY     Glaucoma  . TOTAL ABDOMINAL HYSTERECTOMY      Allergies  Allergies  Allergen Reactions  . Other   . Alphagan [Brimonidine] Itching and Rash  . Pravachol [Pravastatin Sodium] Itching and Rash  . Pravastatin Itching, Other (See Comments) and Rash    History of Present Illness    84 year old female with the above past medical history including recurrent falls felt to be due to orthostatic hypotension vasovagal syncope, PAF, HFpEF, hypertension, CLL, vitamin B12 deficiency, and anemia.  In the fall 2017, she was admitted for recurrent presyncope and syncope with echocardiogram showing normal LV function at the time.  Stress testing was nonischemic.  She worked a  Holter monitor in October 2017 followed by 2 separate event monitors in December 2017.  She was never shown to have any significant bradycardia or tachyarrhythmias but was found to have a 4% PAF burden on the last event monitor in late December 2017.  She has been anticoagulated with Xarelto since.  She was last seen in clinic in April at which time she was doing reasonably well.  Last week, after walking up her driveway, she got into her house and then lost her footing a little bit and fell forward.  She struck the left side of her face on a counter and then fell onto her right side.  She was seen in the emergency department with negative work-up including head CT.  She has had significant left facial bruising as well as right lateral leg bruising extending from her foot to her hip.  She otherwise has been feeling well but then today, while eating breakfast, she felt as though her vision was blurring a little bit.  She associates this sensation with a vasovagal episode.  She asked her daughter for a [facility] which is how she typically would treat a vasovagal episode and her daughter noticed what was happening.  Her daughter tried to lay her down but was not successful.  Patient was becoming diaphoretic but remained responsive throughout.  EMS was called and upon their arrival, patient started feeling better.  Her heart rate was in the 40s though per family, ECG did  not show anything acute.  Blood pressure was initially in the 90s but subsequently came up.  Patient has had no recurrence of symptoms.  She denies chest pain, dyspnea, palpitations.  Home Medications    Prior to Admission medications   Medication Sig Start Date End Date Taking? Authorizing Provider  ACCU-CHEK SOFTCLIX LANCETS lancets Use as instructed; check once a day; H40.1131 05/08/16  Yes Lada, Satira Anis, MD  amLODipine (NORVASC) 2.5 MG tablet Take 1 tablet (2.5 mg total) by mouth daily. 12/10/17  Yes End, Harrell Gave, MD  atorvastatin  (LIPITOR) 20 MG tablet TAKE ONE TABLET BY MOUTH AT BEDTIME 06/07/17  Yes Lada, Satira Anis, MD  Calcium Carb-Cholecalciferol (CALCIUM 600 + D PO) Take by mouth daily.   Yes [provider]  cholecalciferol (VITAMIN D) 1000 units tablet Take 1,000 Units by mouth daily.   Yes [provider]  Cyanocobalamin (B-12) 1000 MCG/ML KIT Inject 1,000 mcg as directed every 30 (thirty) days.   Yes [provider]  glucose blood (ACCU-CHEK AVIVA PLUS) test strip Use as instructed, check once a day; H40.1131 05/08/16  Yes Lada, Satira Anis, MD  ipratropium (ATROVENT) 0.03 % nasal spray Place 2 sprays into both nostrils 3 (three) times daily. 12/21/15  Yes Lada, Satira Anis, MD  IRON PO Take 1 tablet by mouth 2 (two) times daily.    Yes [provider]  latanoprost (XALATAN) 0.005 % ophthalmic solution Place 1 drop into the right eye at bedtime.  04/24/15  Yes [provider]  levothyroxine (SYNTHROID, LEVOTHROID) 75 MCG tablet Take one pill by mouth on Mondays, Wednesdays, Fridays, and Sundays 11/07/17  Yes Lada, Satira Anis, MD  levothyroxine (SYNTHROID, LEVOTHROID) 88 MCG tablet Take one by mouth on Tuesdays, Thursdays, and Saturdays 11/07/17  Yes Lada, Satira Anis, MD  lisinopril (PRINIVIL,ZESTRIL) 5 MG tablet Take 5 mg (1 tablet) by mouth daily if Systolic JP>216. Take 2.5 mg (1/2 tablet) by mouth daily is Systolic BP> 244. 69/50/72  Yes End, Harrell Gave, MD  metroNIDAZOLE (METROCREAM) 0.75 % cream Apply topically 2 (two) times daily. 09/19/17  Yes Lada, Satira Anis, MD  Multiple Vitamins-Minerals (MULTIVITAMIN ADULT PO) Take by mouth.   Yes [provider]  Omega-3 Fatty Acids (FISH OIL) 1200 MG CAPS Take by mouth daily.   Yes [provider]  rivaroxaban (XARELTO) 20 MG TABS tablet Take 1 tablet (20 mg total) by mouth daily with supper. 01/22/18  Yes End, Harrell Gave, MD  timolol (TIMOPTIC) 0.5 % ophthalmic solution Place 1 drop into the right eye 2 (two) times  daily.  04/24/15  Yes [provider]    Review of Systems    Mechanical fall last week with resultant bruising of the left side of her face and right side of her leg.  Presyncope today as outlined above.  She denies chest pain, dyspnea, palpitations, PND, orthopnea, edema, or early satiety.  All other systems reviewed and are otherwise negative except as noted above.  Physical Exam    VS:  BP (!) 150/60 (BP Location: Left Arm, Patient Position: Sitting, Cuff Size: Normal)   Pulse 68   Ht 5' (1.524 m)   Wt 137 lb 8 oz (62.4 kg)   LMP  (LMP Unknown)   BMI 26.85 kg/m  , BMI Body mass index is 26.85 kg/m.  Orthostatic VS for the past 24 hrs:  BP- Lying Pulse- Lying BP- Sitting Pulse- Sitting BP- Standing at 0 minutes Pulse- Standing at 0 minutes  03/12/18 1330 164/63 66  149/61 68 144/55 67   GEN: Well nourished, well developed, in no acute distress. HEENT: She has bruising from her left mandible to left maxillary area. Neck: Supple, no JVD, carotid bruits, or masses.  Bruising of the left neck noted. Cardiac: RRR, no murmurs, rubs, or gallops. No clubbing, cyanosis, edema.  Radials/DP/PT 2+ and equal bilaterally.  The right lateral thigh and lower leg are ecchymotic. Respiratory:  Respirations regular and unlabored, clear to auscultation bilaterally. GI: Soft, nontender, nondistended, BS + x 4. MS: no deformity or atrophy. Skin: warm and dry, no rash.  Bruising as outlined above. Neuro:  Strength and sensation are intact. Psych: Normal affect.  Accessory Clinical Findings    ECG personally reviewed by me today -sinus rhythm, 68, first-degree AV block, IVCD- no acute changes.  Assessment & Plan    1.  Orthostatic hypotension/vasovagal near syncope: Patient with a history of orthostasis and vasovagal syncope.  She had a fall which was mechanical last week suffering significant bruising of her left face and right leg.  Today, she was sitting and eating breakfast when she felt  her vision become blurry, which she typically associates with a vasovagal episode.  She was noted to be diaphoretic by family.  EMTs evaluated her and found to be bradycardic and hypotensive though this did resolve.  ECG was apparently bradycardic though otw nl, though we do not have that here today.  She is currently asymptomatic.  I suspect her episode again represents another vasovagal episode and I have encouraged adequate hydration and even consideration for compression stockings once bruising on the right lower extremity improves.  She has previously worn Holter and event monitors which did not show any significant bradycardia or tachyarrhythmias in 2017.  We discussed possibly placing a monitor again in she really hopes to avoid repeat monitoring if possible.  I advised that we can forego monitoring at this time, esp since she was seen by medical professionals and monitored during this event.  If she has any recurrence of presyncope or syncope, we would likely plan to place an event monitor.  She and her daughter were agreeable with this plan.  CBC/BMET today.  2.  Paroxysmal atrial fibrillation: No recent palpitations.  In sinus today.  She remains on Xarelto therapy.  She does have significant bruising after recent mechanical fall head CT was negative.  I will follow-up a CBC today given near syncope earlier today.  3.  Essential hypertension: Blood pressure mildly elevated today.  Given her history of orthostasis, we have been reluctant to be too aggressive in treating her blood pressure.  4.  Hyperlipidemia: Continue statin therapy.   5.  Disposition: Follow-up with Dr. Saunders Revel on October 25 as previously planned.   [name], NP 03/12/2018, 4:23 PM

## 2018-03-12 NOTE — Patient Instructions (Signed)
Medication Instructions:  Your physician recommends that you continue on your current medications as directed. Please refer to the Current Medication list given to you today.   Labwork: Your physician recommends that you return for lab work in: TODAY (CBC, BMET).   Testing/Procedures: none  Follow-Up: Your physician recommends that you schedule a follow-up appointment as scheduled with Dr End.  If you need a refill on your cardiac medications before your next appointment, please call your pharmacy.

## 2018-03-13 LAB — CBC WITH DIFFERENTIAL/PLATELET
BASOS: 0 %
Basophils Absolute: 0 10*3/uL (ref 0.0–0.2)
EOS (ABSOLUTE): 0.1 10*3/uL (ref 0.0–0.4)
Eos: 1 %
HEMATOCRIT: 32.2 % — AB (ref 34.0–46.6)
HEMOGLOBIN: 11 g/dL — AB (ref 11.1–15.9)
IMMATURE GRANS (ABS): 0.1 10*3/uL (ref 0.0–0.1)
Immature Granulocytes: 1 %
LYMPHS: 43 %
Lymphocytes Absolute: 3.8 10*3/uL — ABNORMAL HIGH (ref 0.7–3.1)
MCH: 31.8 pg (ref 26.6–33.0)
MCHC: 34.2 g/dL (ref 31.5–35.7)
MCV: 93 fL (ref 79–97)
Monocytes Absolute: 0.8 10*3/uL (ref 0.1–0.9)
Monocytes: 9 %
NEUTROS PCT: 46 %
Neutrophils Absolute: 4.2 10*3/uL (ref 1.4–7.0)
Platelets: 206 10*3/uL (ref 150–450)
RBC: 3.46 x10E6/uL — ABNORMAL LOW (ref 3.77–5.28)
RDW: 13 % (ref 12.3–15.4)
WBC: 9 10*3/uL (ref 3.4–10.8)

## 2018-03-13 LAB — BASIC METABOLIC PANEL
BUN/Creatinine Ratio: 19 (ref 12–28)
BUN: 14 mg/dL (ref 10–36)
CALCIUM: 9.6 mg/dL (ref 8.7–10.3)
CO2: 24 mmol/L (ref 20–29)
Chloride: 98 mmol/L (ref 96–106)
Creatinine, Ser: 0.74 mg/dL (ref 0.57–1.00)
GFR calc Af Amer: 82 mL/min/{1.73_m2} (ref >59)
GFR, EST NON AFRICAN AMERICAN: 72 mL/min/{1.73_m2} (ref >59)
Glucose: 106 mg/dL — ABNORMAL HIGH (ref 65–99)
POTASSIUM: 5 mmol/L (ref 3.5–5.2)
Sodium: 136 mmol/L (ref 134–144)

## 2018-03-14 ENCOUNTER — Ambulatory Visit: Payer: Medicare Other | Admitting: Nurse Practitioner

## 2018-03-28 ENCOUNTER — Other Ambulatory Visit: Payer: Self-pay | Admitting: Urgent Care

## 2018-03-28 ENCOUNTER — Ambulatory Visit: Payer: Medicare Other

## 2018-03-28 ENCOUNTER — Other Ambulatory Visit: Payer: Self-pay | Admitting: Hematology and Oncology

## 2018-04-01 ENCOUNTER — Inpatient Hospital Stay: Payer: Medicare Other | Attending: Hematology and Oncology

## 2018-04-01 DIAGNOSIS — Z23 Encounter for immunization: Secondary | ICD-10-CM | POA: Diagnosis not present

## 2018-04-01 DIAGNOSIS — E538 Deficiency of other specified B group vitamins: Secondary | ICD-10-CM | POA: Diagnosis not present

## 2018-04-01 MED ORDER — CYANOCOBALAMIN 1000 MCG/ML IJ SOLN
1000.0000 ug | Freq: Once | INTRAMUSCULAR | Status: AC
  Administered 2018-04-01: 1000 ug via INTRAMUSCULAR

## 2018-04-19 ENCOUNTER — Encounter: Payer: Self-pay | Admitting: Internal Medicine

## 2018-04-19 ENCOUNTER — Ambulatory Visit (INDEPENDENT_AMBULATORY_CARE_PROVIDER_SITE_OTHER): Payer: Medicare Other | Admitting: Internal Medicine

## 2018-04-19 VITALS — BP 156/60 | HR 59 | Ht 61.0 in | Wt 138.5 lb

## 2018-04-19 DIAGNOSIS — M79605 Pain in left leg: Secondary | ICD-10-CM

## 2018-04-19 DIAGNOSIS — I10 Essential (primary) hypertension: Secondary | ICD-10-CM | POA: Diagnosis not present

## 2018-04-19 DIAGNOSIS — R55 Syncope and collapse: Secondary | ICD-10-CM | POA: Insufficient documentation

## 2018-04-19 DIAGNOSIS — I6523 Occlusion and stenosis of bilateral carotid arteries: Secondary | ICD-10-CM | POA: Insufficient documentation

## 2018-04-19 DIAGNOSIS — M79604 Pain in right leg: Secondary | ICD-10-CM

## 2018-04-19 DIAGNOSIS — I5032 Chronic diastolic (congestive) heart failure: Secondary | ICD-10-CM

## 2018-04-19 DIAGNOSIS — I480 Paroxysmal atrial fibrillation: Secondary | ICD-10-CM

## 2018-04-19 NOTE — Progress Notes (Signed)
Follow-up Outpatient Visit Date: 04/19/2018  Primary Care Provider: Arnetha Courser, MD [address] [place] [address]  Chief Complaint: Follow-up syncope, PAF, and HFpEF  HPI:  Sydney Mcdaniel is a 84 y.o. year-old female with history of recurrent falls felt to be due to orthostatic hypotension and vasovagal syncope, paroxysmal atrial fibrillation, HFpEF, HTN, CLL, vitamin B12 deficiency, and anemia, who presents for follow-up of syncope.  I last saw her in April, at which time she was doing well without further lightheadedness or syncope.  She suffered a mechanical fall in late September, striking her face and right side.  Other than significant bruising, she did not have any injuries.  She was seen by Ignacia Bayley on 03/12/2018, at which time she also described an episode of lightheadedness and low heart rate felt to be vasovagal in nature.  CBC and BMP at that time more unremarkable except for mild anemia with a hemoglobin of 11.  Today, Sydney Mcdaniel reports feeling well other then bilateral proximal thigh achiness and weakness when she first gets up in the morning.  The pain resolved with acetaminophen.  Symptoms began ~1 week ago.  She denies obvious cause, including trauma.  She has not fallen since her last visit with [place].  She remains on the same medications.  She denies chest pain, shortness of breath, lightheadedness, and edema.  She has not had any lightheadedness or syncope/near-syncope.  She notes rare brief palpitations without associated symptoms.  --------------------------------------------------------------------------------------------------  Cardiovascular History & Procedures: Cardiovascular Problems:  Recurrent syncope  Paroxysmal atrial fibrillation  HFpEF  Risk Factors:  Hypertension and age greater than 38  Cath/PCI:  None  CV Surgery:  None  EP Procedures and Devices:  Event monitor (06/15/16): Dominantly sinus rhythm with paroxysmal  atrial fibrillation (4% burden). No significant pauses.  Event monitor(06/02/16): Normal sinus rhythm with first-degree AV block. No significant arrhythmia.  Holter monitor (04/17/16): Predominantly sinus rhythm with first-degree AV block. PACs and short atrial runs lasting up to 4 beats noted. Rare PVCs.  Non-Invasive Evaluation(s):  Carotid Doppler (04/30/17): Moderate (40-59%) proximal ICA stenoses bilaterally.  Greater than 50% left external carotid artery stenosis.  No significant change from prior study in 04/2016.  TTE (04/17/16): Normal LV size. LVEF 55-60%. Normal wall motion and diastolic function. Mild aortic and mitral regurgitation. Normal RV size and function. Normal pulmonary artery pressure.  Pharmacologic MPI (04/10/16): Low risk study with small fixed defect at the apex, likely due to breast attenuation. No ischemia. LVEF 55-65%.  Recent CV Pertinent Labs: Lab Results  Component Value Date   CHOL 128 12/21/2015   CHOL 141 08/24/2015   HDL 62 12/21/2015   HDL 52 08/24/2015   LDLCALC 44 12/21/2015   LDLCALC 64 08/24/2015   TRIG 111 12/21/2015   CHOLHDL 2.1 12/21/2015   K 5.0 03/12/2018   MG 2.2 12/21/2015   BUN 14 03/12/2018   CREATININE 0.74 03/12/2018   CREATININE 0.83 04/24/2016    Past medical and surgical history were reviewed and updated in EPIC.  Current Meds  Medication Sig  . ACCU-CHEK SOFTCLIX LANCETS lancets Use as instructed; check once a day; H40.1131  . amLODipine (NORVASC) 2.5 MG tablet Take 1 tablet (2.5 mg total) by mouth daily.  Marland Kitchen atorvastatin (LIPITOR) 20 MG tablet TAKE ONE TABLET BY MOUTH AT BEDTIME  . Calcium Carb-Cholecalciferol (CALCIUM 600 + D PO) Take by mouth daily.  . cholecalciferol (VITAMIN D) 1000 units tablet Take 1,000 Units by mouth daily.  Marland Kitchen  Cyanocobalamin (B-12) 1000 MCG/ML KIT Inject 1,000 mcg as directed every 30 (thirty) days.  Marland Kitchen glucose blood (ACCU-CHEK AVIVA PLUS) test strip Use as instructed, check once a  day; H40.1131  . ipratropium (ATROVENT) 0.03 % nasal spray Place 2 sprays into both nostrils 3 (three) times daily.  . IRON PO Take 1 tablet by mouth 2 (two) times daily.   Marland Kitchen latanoprost (XALATAN) 0.005 % ophthalmic solution Place 1 drop into the right eye at bedtime.   Marland Kitchen levothyroxine (SYNTHROID, LEVOTHROID) 75 MCG tablet Take one pill by mouth on Mondays, Wednesdays, Fridays, and Sundays  . levothyroxine (SYNTHROID, LEVOTHROID) 88 MCG tablet Take one by mouth on Tuesdays, Thursdays, and Saturdays  . lisinopril (PRINIVIL,ZESTRIL) 5 MG tablet Take 5 mg (1 tablet) by mouth daily if Systolic UD>149. Take 2.5 mg (1/2 tablet) by mouth daily is Systolic BP> 702.  . metroNIDAZOLE (METROCREAM) 0.75 % cream Apply topically 2 (two) times daily.  . Multiple Vitamins-Minerals (MULTIVITAMIN ADULT PO) Take by mouth.  . Omega-3 Fatty Acids (FISH OIL) 1200 MG CAPS Take by mouth daily.  . rivaroxaban (XARELTO) 20 MG TABS tablet Take 1 tablet (20 mg total) by mouth daily with supper.  . timolol (TIMOPTIC) 0.5 % ophthalmic solution Place 1 drop into the right eye 2 (two) times daily.     Allergies: Other; Alphagan [brimonidine]; Pravachol [pravastatin sodium]; and Pravastatin  Social History   Tobacco Use  . Smoking status: Never Smoker  . Smokeless tobacco: Never Used  Substance Use Topics  . Alcohol use: No  . Drug use: No    Family History  Problem Relation Age of Onset  . Heart attack Mother   . Glaucoma Mother   . Heart attack Father   . Parkinson's disease Brother   . Heart attack Brother     Review of Systems: A 12-system review of systems was performed and was negative except as noted in the HPI.  --------------------------------------------------------------------------------------------------  Physical Exam: BP (!) 156/60 (BP Location: Left Arm, Patient Position: Sitting, Cuff Size: Normal)   Pulse (!) 59   Ht '5\' 1"'  (1.549 m)   Wt 138 lb 8 oz (62.8 kg)   LMP  (LMP Unknown)   BMI  26.17 kg/m   General:  NAD. HEENT: No conjunctival pallor or scleral icterus. Moist mucous membranes.  OP clear. Neck: Supple without lymphadenopathy, thyromegaly, JVD, or HJR. No carotid bruit. Lungs: Normal work of breathing. Clear to auscultation bilaterally without wheezes or crackles. Heart: Bradycardic but regular without murmurs, rubs, or gallops. Non-displaced PMI. Abd: Bowel sounds present. Soft, NT/ND without hepatosplenomegaly Ext: No lower extremity edema. Radial, PT, and DP pulses are 2+ bilaterally.  Hip flexion strength is 4+/5 bilaterally. Skin: Warm and dry without rash.  EKG:  Sinus bradycardia with 1st degree AV block (PR interval 252).  Otherwise, no significant abnormalities.  Lab Results  Component Value Date   WBC 9.0 03/12/2018   HGB 11.0 (L) 03/12/2018   HCT 32.2 (L) 03/12/2018   MCV 93 03/12/2018   PLT 206 03/12/2018    Lab Results  Component Value Date   NA 136 03/12/2018   K 5.0 03/12/2018   CL 98 03/12/2018   CO2 24 03/12/2018   BUN 14 03/12/2018   CREATININE 0.74 03/12/2018   GLUCOSE 106 (H) 03/12/2018   ALT 23 11/08/2017    Lab Results  Component Value Date   CHOL 128 12/21/2015   HDL 62 12/21/2015   LDLCALC 44 12/21/2015   TRIG 111 12/21/2015  CHOLHDL 2.1 12/21/2015    --------------------------------------------------------------------------------------------------  ASSESSMENT AND PLAN: Paroxysmal atrial fibrillation Only rare brief palpitations noted by Sydney Mcdaniel.  She is in sinus rhythm today.  We will continue with long-term anticoagulation using rivaroxaban unless recurrent falls or bleeding become an issue.  Given low normal heart rate at rest, we will defer adding a beta-blocker or non-dihydropyridine calcium channel blocker.  Vasovagal near-syncope No recurrence since her last visit.  I encouraged her to stay adequately hydrated.  We will tolerate a degree of permissive hypertension, as detailed below.  HFpEF Sydney Mcdaniel  appears euvolemic and well-compensated with NYHA class II symptoms.  She can continue to take lisinopril based on her home BP readings, as she has had issues with symptomatic hypotension in the past on standing doses.  No indication for adding a diuretic at this time.  Carotid artery stenosis No new symptoms.  Moderate bilateral ICA disease noted on most recent Doppler last year.  Sydney Mcdaniel is scheduled for follow-up with vascular surgery next month.  She should continue with rivaroxaban in lieu of aspirin.  She should also remain on atorvastatin, though if her leg pain worsens, a statin holiday will need to be considered.  Leg pain Exam is unrevealing.  I suspect it is musculoskeletal.  Time course would be unusual for statin-induced myalgias.  However, if pain/weakness persist, a statin holiday will need to be considered.  I asked Sydney Mcdaniel to contact us if her symptoms do not improve over the next few weeks.  Hypertension BP mildly elevated today.  However, we will continue with permissive hypertension and self-titration of lisinopril (2.5 mg daily for SBP > 140 mmHg, 5 mg daily for SBP > 160 mmHg), given history of symptomatic hypotension.  No other medication changes today.  Follow-up:  Return to clinic in 4 months.  Nelva Bush, MD 04/19/2018 11:11 AM

## 2018-04-19 NOTE — Patient Instructions (Addendum)
Medication Instructions:  Your physician recommends that you continue on your current medications as directed. Please refer to the Current Medication list given to you today.  If you need a refill on your cardiac medications before your next appointment, please call your pharmacy.   Lab work: none If you have labs (blood work) drawn today and your tests are completely normal, you will receive your results only by: Marland Kitchen MyChart Message (if you have MyChart) OR . A paper copy in the mail If you have any lab test that is abnormal or we need to change your treatment, we will call you to review the results.  Testing/Procedures: none  Follow-Up: At [facility], you and your health needs are our priority.  As part of our continuing mission to provide you with exceptional heart care, we have created designated Provider Care Teams.  These Care Teams include your primary Cardiologist (physician) and Advanced Practice Providers (APPs -  Physician Assistants and Nurse Practitioners) who all work together to provide you with the care you need, when you need it. You will need a follow up appointment in 4 months.  Please call our office 2 months in advance to schedule this appointment.  You may see Nelva Bush, MD or one of the following Advanced Practice Providers on your designated Care Team:   [name], NP Christell Faith, PA-C . Marrianne Mood, PA-C  Any Other Special Instructions Will Be Listed Below (If Applicable).  You may take over the counter Tylenol as needed for aching in your thighs, if the pain does not subside in about a month, then call our office for further instructions.   Medication Samples have been provided to the patient.  Drug name: Xarelto       Strength: 20 mg        Qty: 4 bottles  LOT: [id number]  Exp.Date: June 2021

## 2018-05-01 ENCOUNTER — Other Ambulatory Visit (INDEPENDENT_AMBULATORY_CARE_PROVIDER_SITE_OTHER): Payer: Self-pay | Admitting: Vascular Surgery

## 2018-05-01 DIAGNOSIS — I679 Cerebrovascular disease, unspecified: Secondary | ICD-10-CM

## 2018-05-02 ENCOUNTER — Ambulatory Visit (INDEPENDENT_AMBULATORY_CARE_PROVIDER_SITE_OTHER): Payer: Medicare Other

## 2018-05-02 ENCOUNTER — Encounter (INDEPENDENT_AMBULATORY_CARE_PROVIDER_SITE_OTHER): Payer: Self-pay | Admitting: Vascular Surgery

## 2018-05-02 ENCOUNTER — Ambulatory Visit (INDEPENDENT_AMBULATORY_CARE_PROVIDER_SITE_OTHER): Payer: Medicare Other | Admitting: Vascular Surgery

## 2018-05-02 VITALS — BP 176/65 | HR 62 | Resp 16 | Ht 61.0 in | Wt 137.0 lb

## 2018-05-02 DIAGNOSIS — I6523 Occlusion and stenosis of bilateral carotid arteries: Secondary | ICD-10-CM

## 2018-05-02 DIAGNOSIS — I480 Paroxysmal atrial fibrillation: Secondary | ICD-10-CM

## 2018-05-02 DIAGNOSIS — I679 Cerebrovascular disease, unspecified: Secondary | ICD-10-CM | POA: Diagnosis not present

## 2018-05-02 DIAGNOSIS — I10 Essential (primary) hypertension: Secondary | ICD-10-CM | POA: Diagnosis not present

## 2018-05-02 DIAGNOSIS — E7800 Pure hypercholesterolemia, unspecified: Secondary | ICD-10-CM | POA: Diagnosis not present

## 2018-05-02 DIAGNOSIS — Z7982 Long term (current) use of aspirin: Secondary | ICD-10-CM | POA: Diagnosis not present

## 2018-05-02 NOTE — Progress Notes (Signed)
MRN : [id number]  Sydney Mcdaniel is a 84 y.o. ([birth year]) female who presents with chief complaint of  Chief Complaint  Patient presents with  . Follow-up    25yrcarotid follow up  .  History of Present Illness:   The patient is seen for follow up evaluation of carotid stenosis. The carotid stenosis followed by ultrasound.   The patient denies amaurosis fugax. There is no recent history of TIA symptoms or focal motor deficits. There is no prior documented CVA.  The patient is taking enteric-coated aspirin 81 mg daily.  There is no history of migraine headaches. There is no history of seizures.  The patient has a history of coronary artery disease, no recent episodes of angina or shortness of breath. The patient denies PAD or claudication symptoms. There is a history of hyperlipidemia which is being treated with a statin.    Carotid Duplex done today shows <50% stenosis bilaterally.  No change compared to last study   Current Meds  Medication Sig  . ACCU-CHEK SOFTCLIX LANCETS lancets Use as instructed; check once a day; H40.1131  . amLODipine (NORVASC) 2.5 MG tablet Take 1 tablet (2.5 mg total) by mouth daily.  .Marland Kitchenatorvastatin (LIPITOR) 20 MG tablet TAKE ONE TABLET BY MOUTH AT BEDTIME  . Calcium Carb-Cholecalciferol (CALCIUM 600 + D PO) Take by mouth daily.  . cholecalciferol (VITAMIN D) 1000 units tablet Take 1,000 Units by mouth daily.  . Cyanocobalamin (B-12) 1000 MCG/ML KIT Inject 1,000 mcg as directed every 30 (thirty) days.  .Marland Kitchenglucose blood (ACCU-CHEK AVIVA PLUS) test strip Use as instructed, check once a day; H40.1131  . ipratropium (ATROVENT) 0.03 % nasal spray Place 2 sprays into both nostrils 3 (three) times daily.  . IRON PO Take 1 tablet by mouth 2 (two) times daily.   .Marland Kitchenlatanoprost (XALATAN) 0.005 % ophthalmic solution Place 1 drop into the right eye at bedtime.   .Marland Kitchenlevothyroxine (SYNTHROID, LEVOTHROID) 75 MCG tablet Take one pill by mouth on Mondays,  Wednesdays, Fridays, and Sundays  . levothyroxine (SYNTHROID, LEVOTHROID) 88 MCG tablet Take one by mouth on Tuesdays, Thursdays, and Saturdays  . lisinopril (PRINIVIL,ZESTRIL) 5 MG tablet Take 5 mg (1 tablet) by mouth daily if Systolic BXU>383 Take 2.5 mg (1/2 tablet) by mouth daily is Systolic BP> 1338  . metroNIDAZOLE (METROCREAM) 0.75 % cream Apply topically 2 (two) times daily.  . Multiple Vitamins-Minerals (MULTIVITAMIN ADULT PO) Take by mouth.  . Omega-3 Fatty Acids (FISH OIL) 1200 MG CAPS Take by mouth daily.  . rivaroxaban (XARELTO) 20 MG TABS tablet Take 1 tablet (20 mg total) by mouth daily with supper.  . timolol (TIMOPTIC) 0.5 % ophthalmic solution Place 1 drop into the right eye 2 (two) times daily.     Past Medical History:  Diagnosis Date  . Allergy   . Anemia   . Cataract   . CLL (chronic lymphocytic leukemia) ([facility]   . Glaucoma   . H/O: hysterectomy    Total  . Hyperlipidemia   . Hypertension   . Hypothyroidism   . Impaired fasting glucose   . Lichen sclerosus   . Osteoporosis    Hips  . PAF (paroxysmal atrial fibrillation) ([facility]    a. 06/15/2016 Event monitor: 4% afib burden; b. CHA2DS2VASc - 4-->Xarelto.  . Syncope    a. 03/2016 Echo: EF55-60%, no rwma, mild AI/MR, nl PASP; b. 03/2016 48h Holter: no significant arrhythmias/pauses; c. 03/2016 MV: mild apical defect, likely breast attenuation, nl EF,  low risk; d. 05/2016 Event monitor: No significant arrhythmia; e. 05/2016 Event monitor: PAF (4%).    Past Surgical History:  Procedure Laterality Date  . APPENDECTOMY    . EYE SURGERY     Glaucoma  . TOTAL ABDOMINAL HYSTERECTOMY      Social History Social History   Tobacco Use  . Smoking status: Never Smoker  . Smokeless tobacco: Never Used  Substance Use Topics  . Alcohol use: No  . Drug use: No    Family History Family History  Problem Relation Age of Onset  . Heart attack Mother   . Glaucoma Mother   . Heart attack Father   . Parkinson's  disease Brother   . Heart attack Brother     Allergies  Allergen Reactions  . Other   . Alphagan [Brimonidine] Itching and Rash  . Pravachol [Pravastatin Sodium] Itching and Rash  . Pravastatin Itching, Other (See Comments) and Rash     REVIEW OF SYSTEMS (Negative unless checked)  Constitutional: '[]' Weight loss  '[]' Fever  '[]' Chills Cardiac: '[]' Chest pain   '[]' Chest pressure   '[]' Palpitations   '[]' Shortness of breath when laying flat   '[]' Shortness of breath with exertion. Vascular:  '[]' Pain in legs with walking   '[]' Pain in legs at rest  '[]' History of DVT   '[]' Phlebitis   '[]' Swelling in legs   '[]' Varicose veins   '[]' Non-healing ulcers Pulmonary:   '[]' Uses home oxygen   '[]' Productive cough   '[]' Hemoptysis   '[]' Wheeze  '[]' COPD   '[]' Asthma Neurologic:  '[]' Dizziness   '[]' Seizures   '[]' History of stroke   '[]' History of TIA  '[]' Aphasia   '[]' Vissual changes   '[]' Weakness or numbness in arm   '[]' Weakness or numbness in leg Musculoskeletal:   '[]' Joint swelling   '[]' Joint pain   '[]' Low back pain Hematologic:  '[]' Easy bruising  '[]' Easy bleeding   '[]' Hypercoagulable state   '[]' Anemic Gastrointestinal:  '[]' Diarrhea   '[]' Vomiting  '[]' Gastroesophageal reflux/heartburn   '[]' Difficulty swallowing. Genitourinary:  '[]' Chronic kidney disease   '[]' Difficult urination  '[]' Frequent urination   '[]' Blood in urine Skin:  '[]' Rashes   '[]' Ulcers  Psychological:  '[]' History of anxiety   '[]'  History of major depression.  Physical Examination  Vitals:   05/02/18 1042 05/02/18 1043  BP: (!) 175/67 (!) 176/65  Pulse: 62   Resp: 16   Weight: 137 lb (62.1 kg)   Height: '5\' 1"'  (1.549 m)    Body mass index is 25.89 kg/m. Gen: WD/WN, NAD Head: [place]/AT, No temporalis wasting.  Ear/Nose/Throat: Hearing grossly intact, nares w/o erythema or drainage Eyes: PER, EOMI, sclera nonicteric.  Neck: Supple, no large masses.   Pulmonary:  Good air movement, no audible wheezing bilaterally, no use of accessory muscles.  Cardiac: RRR, no JVD Vascular: left carotid  bruit Vessel Right Left  Radial Palpable Palpable  Ulnar Palpable Palpable  Brachial Palpable Palpable  Carotid Palpable Palpable  Gastrointestinal: Non-distended. No guarding/no peritoneal signs.  Musculoskeletal: M/S 5/5 throughout.  No deformity or atrophy.  Neurologic: CN 2-12 intact. Symmetrical.  Speech is fluent. Motor exam as listed above. Psychiatric: Judgment intact, Mood & affect appropriate for pt's clinical situation. Dermatologic: No rashes or ulcers noted.  No changes consistent with cellulitis. Lymph : No lichenification or skin changes of chronic lymphedema.  CBC Lab Results  Component Value Date   WBC 9.0 03/12/2018   HGB 11.0 (L) 03/12/2018   HCT 32.2 (L) 03/12/2018   MCV 93 03/12/2018   PLT 206 03/12/2018    BMET  Component Value Date/Time   NA 136 03/12/2018 1414   K 5.0 03/12/2018 1414   CL 98 03/12/2018 1414   CO2 24 03/12/2018 1414   GLUCOSE 106 (H) 03/12/2018 1414   GLUCOSE 105 (H) 11/08/2017 1009   BUN 14 03/12/2018 1414   CREATININE 0.74 03/12/2018 1414   CREATININE 0.83 04/24/2016 0808   CALCIUM 9.6 03/12/2018 1414   GFRNONAA 72 03/12/2018 1414   GFRNONAA 63 04/24/2016 0808   GFRAA 82 03/12/2018 1414   GFRAA 73 04/24/2016 0808   CrCl cannot be calculated (Patient's most recent lab result is older than the maximum 21 days allowed.).  COAG No results found for: INR, PROTIME  Radiology No results found.   Assessment/Plan 1. Atherosclerosis of both carotid arteries Recommend:  Given the patient's asymptomatic subcritical stenosis no further invasive testing or surgery at this time.  Duplex ultrasound shows <50% stenosis bilaterally which has been unchanged when compared to the previous studies.  Continue antiplatelet therapy as prescribed Continue management of CAD, HTN and Hyperlipidemia Healthy heart diet,  encouraged exercise at least 4 times per week  Given the stable <50% bilateral carotid stenosis in association with the  patient's age the patient will follow up PRN.  The patient is told that if symptoms of a TIA should occur then he should go to the ER and I should be notified, as this would change the management course.  The patient voices understanding.   2. PAF (paroxysmal atrial fibrillation) (HCC) Continue antiarrhythmia medications as already ordered, these medications have been reviewed and there are no changes at this time.  Continue anticoagulation as ordered by Cardiology Service   3. Essential hypertension Continue antihypertensive medications as already ordered, these medications have been reviewed and there are no changes at this time.   4. Hypercholesteremia Continue statin as ordered and reviewed, no changes at this time     Hortencia Pilar, MD  05/02/2018 11:01 AM

## 2018-05-09 ENCOUNTER — Ambulatory Visit: Payer: Medicare Other | Admitting: Hematology and Oncology

## 2018-05-09 ENCOUNTER — Ambulatory Visit: Payer: Medicare Other

## 2018-05-09 ENCOUNTER — Other Ambulatory Visit: Payer: Medicare Other

## 2018-05-13 ENCOUNTER — Encounter: Payer: Self-pay | Admitting: Hematology and Oncology

## 2018-05-13 ENCOUNTER — Other Ambulatory Visit: Payer: Self-pay | Admitting: Hematology and Oncology

## 2018-05-13 ENCOUNTER — Inpatient Hospital Stay: Payer: Medicare Other

## 2018-05-13 ENCOUNTER — Inpatient Hospital Stay: Payer: Medicare Other | Attending: Hematology and Oncology

## 2018-05-13 ENCOUNTER — Inpatient Hospital Stay (HOSPITAL_BASED_OUTPATIENT_CLINIC_OR_DEPARTMENT_OTHER): Payer: Medicare Other | Admitting: Hematology and Oncology

## 2018-05-13 ENCOUNTER — Other Ambulatory Visit: Payer: Self-pay | Admitting: Urgent Care

## 2018-05-13 VITALS — BP 188/71 | HR 59 | Temp 97.3°F | Resp 18 | Wt 137.2 lb

## 2018-05-13 DIAGNOSIS — E039 Hypothyroidism, unspecified: Secondary | ICD-10-CM

## 2018-05-13 DIAGNOSIS — C9110 Chronic lymphocytic leukemia of B-cell type not having achieved remission: Secondary | ICD-10-CM | POA: Diagnosis not present

## 2018-05-13 DIAGNOSIS — E538 Deficiency of other specified B group vitamins: Secondary | ICD-10-CM

## 2018-05-13 DIAGNOSIS — D649 Anemia, unspecified: Secondary | ICD-10-CM

## 2018-05-13 LAB — COMPREHENSIVE METABOLIC PANEL
ALT: 32 U/L (ref 0–44)
AST: 30 U/L (ref 15–41)
Albumin: 4 g/dL (ref 3.5–5.0)
Alkaline Phosphatase: 26 U/L — ABNORMAL LOW (ref 38–126)
Anion gap: 7 (ref 5–15)
BUN: 15 mg/dL (ref 8–23)
CO2: 26 mmol/L (ref 22–32)
Calcium: 9.1 mg/dL (ref 8.9–10.3)
Chloride: 97 mmol/L — ABNORMAL LOW (ref 98–111)
Creatinine, Ser: 0.73 mg/dL (ref 0.44–1.00)
GFR calc Af Amer: >60 mL/min (ref >60)
GFR calc non Af Amer: >60 mL/min (ref >60)
Glucose, Bld: 104 mg/dL — ABNORMAL HIGH (ref 70–99)
Potassium: 4.2 mmol/L (ref 3.5–5.1)
Sodium: 130 mmol/L — ABNORMAL LOW (ref 135–145)
Total Bilirubin: 0.4 mg/dL (ref 0.3–1.2)
Total Protein: 7.1 g/dL (ref 6.5–8.1)

## 2018-05-13 LAB — CBC WITH DIFFERENTIAL/PLATELET
Abs Immature Granulocytes: 0.01 10*3/uL (ref 0.00–0.07)
Basophils Absolute: 0 10*3/uL (ref 0.0–0.1)
Basophils Relative: 0 %
Eosinophils Absolute: 0.1 10*3/uL (ref 0.0–0.5)
Eosinophils Relative: 2 %
HCT: 33.4 % — ABNORMAL LOW (ref 36.0–46.0)
Hemoglobin: 11 g/dL — ABNORMAL LOW (ref 12.0–15.0)
Immature Granulocytes: 0 %
Lymphocytes Relative: 51 %
Lymphs Abs: 3.7 10*3/uL (ref 0.7–4.0)
MCH: 30.9 pg (ref 26.0–34.0)
MCHC: 32.9 g/dL (ref 30.0–36.0)
MCV: 93.8 fL (ref 80.0–100.0)
Monocytes Absolute: 0.9 10*3/uL (ref 0.1–1.0)
Monocytes Relative: 12 %
Neutro Abs: 2.6 10*3/uL (ref 1.7–7.7)
Neutrophils Relative %: 35 %
Platelets: 150 10*3/uL (ref 150–400)
RBC: 3.56 MIL/uL — ABNORMAL LOW (ref 3.87–5.11)
RDW: 13.3 % (ref 11.5–15.5)
WBC: 7.3 10*3/uL (ref 4.0–10.5)
nRBC: 0 % (ref 0.0–0.2)

## 2018-05-13 LAB — FOLATE: Folate: 38 ng/mL (ref >5.9)

## 2018-05-13 LAB — TSH: TSH: 3.05 u[IU]/mL (ref 0.350–4.500)

## 2018-05-13 LAB — FERRITIN: Ferritin: 98 ng/mL (ref 11–307)

## 2018-05-13 MED ORDER — CYANOCOBALAMIN 1000 MCG/ML IJ SOLN
1000.0000 ug | Freq: Once | INTRAMUSCULAR | Status: AC
  Administered 2018-05-13: 1000 ug via INTRAMUSCULAR
  Filled 2018-05-13: qty 1

## 2018-05-13 NOTE — Progress Notes (Signed)
[facility] day:  05/13/18  Chief Complaint: Sydney Mcdaniel is a 84 y.o. female with chronic lymphocytic leukemia, B12 deficiency, and anemia who is seen for 6 month assessment.  HPI:  The patient was last seen in the hematology clinic on 05/162019.  At that time, she denied any B symptoms.  Exam revealed no palpable adenopathy or hepatosplenomegaly.  Hematocrit was 35.8 and hemoglobin 12.3.  She has received B12 monthly (11/08/2017 - 04/01/2018).  CBC on 03/12/2018 revealed a hematocrit of 32.3, hemoglobin 11.0, MCV 93, platelets 206,000, WBC 9000 with an ANC of 4200.  ALC was 3800.  During the interim, patient complains of pain in her legs. She is scheduled to see orthopedics later this week. She notes that her balance has not been as good as it has been. Patient as sustained a mechanical fall on 03/04/2018. Patient struck face on counter. She was seen in the ED and facial films were negative for fracture. The following week, patient advises that she was pre-syncopal due to issues with her blood pressure. Daughter states, "they want her blood pressure high because she has a tendency to pass out when it on the lower side".   Patient denies that she has experienced any B symptoms. She denies any interval infections. Her energy is fairly stable. Patient denies bleeding; no hematochezia, melena, or gross hematuria. She denies any areas of unexplained bruising.   Patient advises that she maintains an adequate appetite. She is eating well. Weight today is 137 lb 3 oz (62.2 kg), which compared to her last visit to the clinic, represents a  2 pound increase.   Patient denies pain in the clinic today.   Past Medical History:  Diagnosis Date  . Allergy   . Anemia   . Cataract   . CLL (chronic lymphocytic leukemia) ([place])   . Glaucoma   . H/O: hysterectomy    Total  . Hyperlipidemia   . Hypertension   . Hypothyroidism   . Impaired fasting glucose    . Lichen sclerosus   . Osteoporosis    Hips  . PAF (paroxysmal atrial fibrillation) ([place])    a. 06/15/2016 Event monitor: 4% afib burden; b. CHA2DS2VASc - 4-->Xarelto.  . Syncope    a. 03/2016 Echo: EF55-60%, no rwma, mild AI/MR, nl PASP; b. 03/2016 48h Holter: no significant arrhythmias/pauses; c. 03/2016 MV: mild apical defect, likely breast attenuation, nl EF, low risk; d. 05/2016 Event monitor: No significant arrhythmia; e. 05/2016 Event monitor: PAF (4%).    Past Surgical History:  Procedure Laterality Date  . APPENDECTOMY    . EYE SURGERY     Glaucoma  . TOTAL ABDOMINAL HYSTERECTOMY      Family History  Problem Relation Age of Onset  . Heart attack Mother   . Glaucoma Mother   . Heart attack Father   . Parkinson's disease Brother   . Heart attack Brother     Social History:  reports that she has never smoked. She has never used smokeless tobacco. She reports that she does not drink alcohol or use drugs.  Her husband died in 2014-12-30.  She notes that she played basketball 75 years ago on a half court.  She lives in [place].  She is accompanied by her daughter, [name].  Allergies:  Allergies  Allergen Reactions  . Other   . Alphagan [Brimonidine] Itching and Rash  . Pravachol [Pravastatin Sodium] Itching and Rash  . Pravastatin Itching, Other (See  Comments) and Rash    Current Medications: Current Outpatient Medications  Medication Sig Dispense Refill  . ACCU-CHEK SOFTCLIX LANCETS lancets Use as instructed; check once a day; H40.1131 100 each 12  . amLODipine (NORVASC) 2.5 MG tablet Take 1 tablet (2.5 mg total) by mouth daily. 90 tablet 2  . atorvastatin (LIPITOR) 20 MG tablet TAKE ONE TABLET BY MOUTH AT BEDTIME 90 tablet 3  . Calcium Carb-Cholecalciferol (CALCIUM 600 + D PO) Take by mouth daily.    . cholecalciferol (VITAMIN D) 1000 units tablet Take 1,000 Units by mouth daily.    . Cyanocobalamin (B-12) 1000 MCG/ML KIT Inject 1,000 mcg as directed every 30  (thirty) days.    Marland Kitchen glucose blood (ACCU-CHEK AVIVA PLUS) test strip Use as instructed, check once a day; H40.1131 100 each 3  . ipratropium (ATROVENT) 0.03 % nasal spray Place 2 sprays into both nostrils 3 (three) times daily. 90 mL 3  . IRON PO Take 1 tablet by mouth 2 (two) times daily.     Marland Kitchen latanoprost (XALATAN) 0.005 % ophthalmic solution Place 1 drop into the right eye at bedtime.     Marland Kitchen levothyroxine (SYNTHROID, LEVOTHROID) 75 MCG tablet Take one pill by mouth on Mondays, Wednesdays, Fridays, and Sundays 52 tablet 2  . levothyroxine (SYNTHROID, LEVOTHROID) 88 MCG tablet Take one by mouth on Tuesdays, Thursdays, and Saturdays 39 tablet 2  . lisinopril (PRINIVIL,ZESTRIL) 5 MG tablet Take 5 mg (1 tablet) by mouth daily if Systolic TG>626. Take 2.5 mg (1/2 tablet) by mouth daily is Systolic BP> 948. 90 tablet 3  . Multiple Vitamins-Minerals (MULTIVITAMIN ADULT PO) Take by mouth.    . Omega-3 Fatty Acids (FISH OIL) 1200 MG CAPS Take by mouth daily.    . rivaroxaban (XARELTO) 20 MG TABS tablet Take 1 tablet (20 mg total) by mouth daily with supper. 90 tablet 3  . timolol (TIMOPTIC) 0.5 % ophthalmic solution Place 1 drop into the right eye 2 (two) times daily.     . metroNIDAZOLE (METROCREAM) 0.75 % cream Apply topically 2 (two) times daily. (Patient not taking: Reported on 05/13/2018) 45 g 0   No current facility-administered medications for this visit.     Review of Systems  Constitutional: Negative for chills, diaphoresis, fever, malaise/fatigue and weight loss (2 pound increase).       "I'm here".  HENT: Negative.  Negative for congestion, nosebleeds, sinus pain and sore throat.   Eyes: Negative for pain and redness.       Glaucoma.  Respiratory: Negative for cough, hemoptysis, sputum production and shortness of breath.   Cardiovascular: Negative for chest pain, palpitations, orthopnea, leg swelling and PND.       Atrial fibrillation.  Gastrointestinal: Negative.  Negative for abdominal  pain, blood in stool, constipation, diarrhea, melena, nausea and vomiting.  Genitourinary: Negative.  Negative for dysuria, frequency, hematuria and urgency.  Musculoskeletal: Positive for falls (x 1 during interim). Negative for back pain, joint pain and myalgias.       Legs bother me.  Skin: Negative.  Negative for itching and rash.  Neurological: Negative for tremors, sensory change, focal weakness and weakness.  Endo/Heme/Allergies: Does not bruise/bleed easily.  Psychiatric/Behavioral: Negative for depression, memory loss and suicidal ideas. The patient is not nervous/anxious and does not have insomnia.   All other systems reviewed and are negative.  Physical Exam: Blood pressure (!) 188/71, pulse (!) 59, temperature (!) 97.3 F (36.3 C), temperature source Tympanic, resp. rate 18, weight 137 lb 3  oz (62.2 kg). GENERAL:  Elderly woman sitting comfortably in the exam room in no acute distress.  She has a cane at her side.  She requires assistance onto the exam table. MENTAL STATUS:  Alert and oriented to person, place and time. HEAD:  Curly gray hair.  Normocephalic, atraumatic, face symmetric, no Cushingoid features. EYES:  Blue eyes.  Pupils equal round and reactive to light and accomodation.  No conjunctivitis or scleral icterus. ENT:  Oropharynx clear without lesion.  Tongue normal. Mucous membranes moist.  RESPIRATORY:  Clear to auscultation without rales, wheezes or rhonchi. CARDIOVASCULAR:  Regular rate and rhythm without murmur, rub or gallop. ABDOMEN:  Soft, non-tender, with active bowel sounds, and no hepatosplenomegaly.  No masses. SKIN:  No rashes, ulcers or lesions. EXTREMITIES: Trace - 1+ ankle edema.  No skin discoloration or tenderness.  No palpable cords.  LYMPH NODES: No palpable cervical, supraclavicular, axillary or inguinal adenopathy  NEUROLOGICAL: Unremarkable. PSYCH:  Appropriate.    Appointment on 05/13/2018  Component Date Value Ref Range Status  . Sodium  05/13/2018 130* 135 - 145 mmol/L Final  . Potassium 05/13/2018 4.2  3.5 - 5.1 mmol/L Final  . Chloride 05/13/2018 97* 98 - 111 mmol/L Final  . CO2 05/13/2018 26  22 - 32 mmol/L Final  . Glucose, Bld 05/13/2018 104* 70 - 99 mg/dL Final  . BUN 05/13/2018 15  8 - 23 mg/dL Final  . Creatinine, Ser 05/13/2018 0.73  0.44 - 1.00 mg/dL Final  . Calcium 05/13/2018 9.1  8.9 - 10.3 mg/dL Final  . Total Protein 05/13/2018 7.1  6.5 - 8.1 g/dL Final  . Albumin 05/13/2018 4.0  3.5 - 5.0 g/dL Final  . AST 05/13/2018 30  15 - 41 U/L Final  . ALT 05/13/2018 32  0 - 44 U/L Final  . Alkaline Phosphatase 05/13/2018 26* 38 - 126 U/L Final  . Total Bilirubin 05/13/2018 0.4  0.3 - 1.2 mg/dL Final  . GFR calc non Af Amer 05/13/2018 >60  >60 mL/min Final  . GFR calc Af Amer 05/13/2018 >60  >60 mL/min Final   Comment: (NOTE) The eGFR has been calculated using the CKD EPI equation. This calculation has not been validated in all clinical situations. eGFR's persistently <60 mL/min signify possible Chronic Kidney Disease.   Georgiann Hahn gap 05/13/2018 7  5 - 15 Final   Performed at [facility], [address] [place], [place] [address]  . WBC 05/13/2018 7.3  4.0 - 10.5 K/uL Final  . RBC 05/13/2018 3.56* 3.87 - 5.11 MIL/uL Final  . Hemoglobin 05/13/2018 11.0* 12.0 - 15.0 g/dL Final  . HCT 05/13/2018 33.4* 36.0 - 46.0 % Final  . MCV 05/13/2018 93.8  80.0 - 100.0 fL Final  . MCH 05/13/2018 30.9  26.0 - 34.0 pg Final  . MCHC 05/13/2018 32.9  30.0 - 36.0 g/dL Final  . RDW 05/13/2018 13.3  11.5 - 15.5 % Final  . Platelets 05/13/2018 150  150 - 400 K/uL Final  . nRBC 05/13/2018 0.0  0.0 - 0.2 % Final  . Neutrophils Relative % 05/13/2018 35  % Final  . Neutro Abs 05/13/2018 2.6  1.7 - 7.7 K/uL Final  . Lymphocytes Relative 05/13/2018 51  % Final  . Lymphs Abs 05/13/2018 3.7  0.7 - 4.0 K/uL Final  . Monocytes Relative 05/13/2018 12  % Final  . Monocytes Absolute 05/13/2018 0.9  0.1 - 1.0 K/uL Final  .  Eosinophils Relative 05/13/2018 2  % Final  .  Eosinophils Absolute 05/13/2018 0.1  0.0 - 0.5 K/uL Final  . Basophils Relative 05/13/2018 0  % Final  . Basophils Absolute 05/13/2018 0.0  0.0 - 0.1 K/uL Final  . Immature Granulocytes 05/13/2018 0  % Final  . Abs Immature Granulocytes 05/13/2018 0.01  0.00 - 0.07 K/uL Final   Performed at [facility], [address] [place],  [address]    Assessment:  Sydney Mcdaniel is a 84 y.o. female with stage 0 CLL.  She presented with a distant history of anemia and recent lymphocytosis.  She was noted to have mild lymphocystosis in 06/22/2015.  Absolute lymphocyte count (ALC) has ranged between 4700 - 5500.  Work-up on 01/10/2016 revealed a hematocrit of 31.6, hemoglobin 11.4, MCV 89, platelets 203,000, WBC 11,00 with an ANC of 3700.  Absolute lymphocyte count was 6400.  Ferritin was 116.  B12 was 119 (low).  Folate was 44.0.  Retic was 0.9% (low).  Uric acid was 4.5.  Guaiac cards were negative x 3 in 12/2015.  She was diagnosed with B12 deficiency.  She receives B12 monthly (last 04/01/2018).  Folate was 38 on 08/07/2016.  Peripheral blood flow cytometry revealed involvement by a CD5+, CD23+, CD38- monoclonal B cell population with lambda light chain restriction consistent with CLL/SLL.  There was a small population (8% of the T cells) of double positive (CD4+/CD8+) T cells (described in association with chronic viral infections, autoimmune disorders, chronic inflammatory disorders, and immunodeficiency states).   She has a slight progressive normocytic anemia.  Work-up on 08/07/2016 revealed the following normal labs: creatinine, LDH, uric acid, ferritin (105), iron studies (12% sat; TIBC 295), and folate.  Retic was 1.1% (low for level of anemia).  Coombs was negative on 09/04/2016.  Diet is modest.  She denies any melena or hematochezia.  Guaiac cards were negative x 3 in 07/2016.  Last colonoscopy was > 10 years ago.  Ferritin has been  followed: 116 on 01/10/2016, 105 on 08/07/2016, 29 on 11/27/2016, 31 on 03/06/2017, 59 on 07/09/2017, and 64 on 11/08/2017.  She was diagnosed with atrial fibrillation after presenting with syncopal episodes.  She is on Xarelto.  Chest CT on 11/07/2016 revealed two 2 mm right lung nodules (right major fissure and lateral aspect RUL).  There was a 5 mm right lower lobe ground-glass nodule.  Chest CT on 11/05/2017 revealed 6 mm ground-glass right lower lobe nodule, stable since 11/07/2016.  Follow-up in 2 years was recommended.  There were millimetric pulmonary nodules unchanged and considered benign.   Symptomatically, she notes leg discomfort.  She denies any B symptoms.  Exam reveals no palpable adenopathy or hepatosplenomegaly.  Hematocrit is 33.4 and hemoglobin 11.0.  Plan: 1.  Labs today:  CBC with diff, CMP, TSH, ferritin, folate. 2.  CLL:  Clinically doing well.  No B symptoms.  Hemoglobin stable.  WBC 7300 with an ALC 3700. 3.  Tiny pulmonary nodules:  Discuss plan for follow-up.  Chest CT on 11/08/2018. 4.  B12 deficiency:  Continue B12 supplementation.  B12 today and monthly x 5 5.  Mild normocytic anemia:  Etiology unclear.  Possibly related to CLL.  Check ferritin, folate, TSH (h/o hypothyroidism), 6.  RTC in 6 months for MD assessment, labs (CBC with diff, BMP, ferritin), and B12.   Honor Loh, NP  05/13/2018, 11:49 AM   I saw and evaluated the patient, participating in the key portions of the service and reviewing pertinent diagnostic studies and records.  I reviewed the nurse  practitioner's note and agree with the findings and the plan.  The assessment and plan were discussed with the patient.  Several questions were asked by the patient and answered.   Lequita Asal, MD 05/13/2018, 11:49 AM

## 2018-05-13 NOTE — Progress Notes (Signed)
Pt in for follow up, reports having some pain in hips and legs on occasion.  Pt BP is elevated took BP meds this as ordered.

## 2018-05-13 NOTE — Progress Notes (Signed)
Rechecked patient's BP 173/72 HR 60.  Patient states she took her BP pill this morning.  She has another one she takes at lunch.  She can recheck her BP @ home.  I asked her to recheck her BP an hour after her lunchtime pill.  If still elevated advised her to call cardiologist.

## 2018-05-14 DIAGNOSIS — M160 Bilateral primary osteoarthritis of hip: Secondary | ICD-10-CM | POA: Diagnosis not present

## 2018-05-14 DIAGNOSIS — M545 Low back pain: Secondary | ICD-10-CM | POA: Diagnosis not present

## 2018-05-15 DIAGNOSIS — H401132 Primary open-angle glaucoma, bilateral, moderate stage: Secondary | ICD-10-CM | POA: Diagnosis not present

## 2018-05-15 DIAGNOSIS — E119 Type 2 diabetes mellitus without complications: Secondary | ICD-10-CM | POA: Diagnosis not present

## 2018-05-21 DIAGNOSIS — M1612 Unilateral primary osteoarthritis, left hip: Secondary | ICD-10-CM | POA: Insufficient documentation

## 2018-05-29 DIAGNOSIS — M6281 Muscle weakness (generalized): Secondary | ICD-10-CM | POA: Diagnosis not present

## 2018-05-29 DIAGNOSIS — M25552 Pain in left hip: Secondary | ICD-10-CM | POA: Diagnosis not present

## 2018-06-05 DIAGNOSIS — M25552 Pain in left hip: Secondary | ICD-10-CM | POA: Diagnosis not present

## 2018-06-05 DIAGNOSIS — M6281 Muscle weakness (generalized): Secondary | ICD-10-CM | POA: Diagnosis not present

## 2018-06-07 DIAGNOSIS — M6281 Muscle weakness (generalized): Secondary | ICD-10-CM | POA: Diagnosis not present

## 2018-06-07 DIAGNOSIS — M25552 Pain in left hip: Secondary | ICD-10-CM | POA: Diagnosis not present

## 2018-06-11 DIAGNOSIS — M6281 Muscle weakness (generalized): Secondary | ICD-10-CM | POA: Diagnosis not present

## 2018-06-11 DIAGNOSIS — M25552 Pain in left hip: Secondary | ICD-10-CM | POA: Diagnosis not present

## 2018-06-13 DIAGNOSIS — M6281 Muscle weakness (generalized): Secondary | ICD-10-CM | POA: Diagnosis not present

## 2018-06-13 DIAGNOSIS — M25552 Pain in left hip: Secondary | ICD-10-CM | POA: Diagnosis not present

## 2018-06-17 ENCOUNTER — Inpatient Hospital Stay: Payer: Medicare Other | Attending: Hematology and Oncology

## 2018-06-17 DIAGNOSIS — E538 Deficiency of other specified B group vitamins: Secondary | ICD-10-CM | POA: Diagnosis not present

## 2018-06-17 MED ORDER — CYANOCOBALAMIN 1000 MCG/ML IJ SOLN
1000.0000 ug | Freq: Once | INTRAMUSCULAR | Status: AC
  Administered 2018-06-17: 1000 ug via INTRAMUSCULAR

## 2018-06-20 DIAGNOSIS — M25552 Pain in left hip: Secondary | ICD-10-CM | POA: Diagnosis not present

## 2018-06-20 DIAGNOSIS — M6281 Muscle weakness (generalized): Secondary | ICD-10-CM | POA: Diagnosis not present

## 2018-06-24 DIAGNOSIS — M6281 Muscle weakness (generalized): Secondary | ICD-10-CM | POA: Diagnosis not present

## 2018-06-24 DIAGNOSIS — M25552 Pain in left hip: Secondary | ICD-10-CM | POA: Diagnosis not present

## 2018-06-26 ENCOUNTER — Other Ambulatory Visit: Payer: Self-pay | Admitting: Internal Medicine

## 2018-06-26 ENCOUNTER — Other Ambulatory Visit: Payer: Self-pay | Admitting: Family Medicine

## 2018-06-26 DIAGNOSIS — I480 Paroxysmal atrial fibrillation: Secondary | ICD-10-CM

## 2018-06-26 DIAGNOSIS — I6523 Occlusion and stenosis of bilateral carotid arteries: Secondary | ICD-10-CM

## 2018-06-26 DIAGNOSIS — I10 Essential (primary) hypertension: Secondary | ICD-10-CM

## 2018-06-26 DIAGNOSIS — I951 Orthostatic hypotension: Secondary | ICD-10-CM

## 2018-06-26 DIAGNOSIS — R7301 Impaired fasting glucose: Secondary | ICD-10-CM

## 2018-06-27 ENCOUNTER — Other Ambulatory Visit: Payer: Self-pay | Admitting: *Deleted

## 2018-06-27 MED ORDER — LISINOPRIL 5 MG PO TABS: ORAL_TABLET | ORAL | 0 refills | Status: DC

## 2018-06-27 NOTE — Telephone Encounter (Signed)
Daughter notified pt has an appt next week and will get then

## 2018-06-27 NOTE — Telephone Encounter (Signed)
It's actually been a long time since her cholesterol was checked Please ask her to stop by for a cholesterol check; she does NOT need to fast We'll also check her glucose and A1c Labs ordered Thank you

## 2018-06-28 DIAGNOSIS — M25552 Pain in left hip: Secondary | ICD-10-CM | POA: Diagnosis not present

## 2018-06-28 DIAGNOSIS — M6281 Muscle weakness (generalized): Secondary | ICD-10-CM | POA: Diagnosis not present

## 2018-07-01 ENCOUNTER — Telehealth: Payer: Self-pay

## 2018-07-01 ENCOUNTER — Other Ambulatory Visit: Payer: Self-pay

## 2018-07-01 MED ORDER — RIVAROXABAN 20 MG PO TABS: 20.0000 mg | ORAL_TABLET | Freq: Every day | ORAL | 3 refills | Status: DC

## 2018-07-02 ENCOUNTER — Ambulatory Visit (INDEPENDENT_AMBULATORY_CARE_PROVIDER_SITE_OTHER): Payer: Medicare Other

## 2018-07-02 VITALS — BP 112/72 | HR 57 | Temp 97.8°F | Resp 16 | Ht 61.0 in | Wt 138.3 lb

## 2018-07-02 DIAGNOSIS — R7301 Impaired fasting glucose: Secondary | ICD-10-CM | POA: Diagnosis not present

## 2018-07-02 DIAGNOSIS — Z780 Asymptomatic menopausal state: Secondary | ICD-10-CM

## 2018-07-02 DIAGNOSIS — Z0000 Encounter for general adult medical examination without abnormal findings: Secondary | ICD-10-CM | POA: Diagnosis not present

## 2018-07-02 DIAGNOSIS — I6523 Occlusion and stenosis of bilateral carotid arteries: Secondary | ICD-10-CM | POA: Diagnosis not present

## 2018-07-02 NOTE — Patient Instructions (Signed)
Sydney Mcdaniel , Thank you for taking time to come for your Medicare Wellness Visit. I appreciate your ongoing commitment to your health goals. Please review the following plan we discussed and let me know if I can assist you in the future.   Screening recommendations/referrals: Colonoscopy: no longer required Mammogram: no longer required Bone Density: Please call [phone] to schedule your bone density exam Recommended yearly ophthalmology/optometry visit for glaucoma screening and checkup Recommended yearly dental visit for hygiene and checkup  Vaccinations: Influenza vaccine: done 04/01/18 Pneumococcal vaccine: done 12/10/13 Tdap vaccine: done 06/27/17 Shingles vaccine: Shingrix discussed. Pleas contact your pharmacy for coverage information.     Advanced directives: Please bring a copy of your health care power of attorney and living will to the office at your convenience.  Conditions/risks identified: Prevent falls by continuing physical therapy for balance and strength training.   Next appointment: Please follow up in one year for your Medicare Annual Wellness visit.     Preventive Care 83 Years and Older, Female Preventive care refers to lifestyle choices and visits with your health care provider that can promote health and wellness. What does preventive care include?  A yearly physical exam. This is also called an annual well check.  Dental exams once or twice a year.  Routine eye exams. Ask your health care provider how often you should have your eyes checked.  Personal lifestyle choices, including:  Daily care of your teeth and gums.  Regular physical activity.  Eating a healthy diet.  Avoiding tobacco and drug use.  Limiting alcohol use.  Practicing safe sex.  Taking low-dose aspirin every day.  Taking vitamin and mineral supplements as recommended by your health care provider. What happens during an annual well check? The services and screenings done by  your health care provider during your annual well check will depend on your age, overall health, lifestyle risk factors, and family history of disease. Counseling  Your health care provider may ask you questions about your:  Alcohol use.  Tobacco use.  Drug use.  Emotional well-being.  Home and relationship well-being.  Sexual activity.  Eating habits.  History of falls.  Memory and ability to understand (cognition).  Work and work Statistician.  Reproductive health. Screening  You may have the following tests or measurements:  Height, weight, and BMI.  Blood pressure.  Lipid and cholesterol levels. These may be checked every 5 years, or more frequently if you are over 2 years old.  Skin check.  Lung cancer screening. You may have this screening every year starting at age 83 if you have a 30-pack-year history of smoking and currently smoke or have quit within the past 15 years.  Fecal occult blood test (FOBT) of the stool. You may have this test every year starting at age 83.  Flexible sigmoidoscopy or colonoscopy. You may have a sigmoidoscopy every 5 years or a colonoscopy every 10 years starting at age 83.  Hepatitis C blood test.  Hepatitis B blood test.  Sexually transmitted disease (STD) testing.  Diabetes screening. This is done by checking your blood sugar (glucose) after you have not eaten for a while (fasting). You may have this done every 1-3 years.  Bone density scan. This is done to screen for osteoporosis. You may have this done starting at age 83.  Mammogram. This may be done every 1-2 years. Talk to your health care provider about how often you should have regular mammograms. Talk with your health care provider about your  test results, treatment options, and if necessary, the need for more tests. Vaccines  Your health care provider may recommend certain vaccines, such as:  Influenza vaccine. This is recommended every year.  Tetanus,  diphtheria, and acellular pertussis (Tdap, Td) vaccine. You may need a Td booster every 10 years.  Zoster vaccine. You may need this after age 83.  Pneumococcal 13-valent conjugate (PCV13) vaccine. One dose is recommended after age 83.  Pneumococcal polysaccharide (PPSV23) vaccine. One dose is recommended after age 83. Talk to your health care provider about which screenings and vaccines you need and how often you need them. This information is not intended to replace advice given to you by your health care provider. Make sure you discuss any questions you have with your health care provider. Document Released: 07/09/2015 Document Revised: 03/01/2016 Document Reviewed: 04/13/2015 Elsevier Interactive Patient Education  2017 [place] Prevention in the Home Falls can cause injuries. They can happen to people of all ages. There are many things you can do to make your home safe and to help prevent falls. What can I do on the outside of my home?  Regularly fix the edges of walkways and driveways and fix any cracks.  Remove anything that might make you trip as you walk through a door, such as a raised step or threshold.  Trim any bushes or trees on the path to your home.  Use bright outdoor lighting.  Clear any walking paths of anything that might make someone trip, such as rocks or tools.  Regularly check to see if handrails are loose or broken. Make sure that both sides of any steps have handrails.  Any raised decks and porches should have guardrails on the edges.  Have any leaves, snow, or ice cleared regularly.  Use sand or salt on walking paths during winter.  Clean up any spills in your garage right away. This includes oil or grease spills. What can I do in the bathroom?  Use night lights.  Install grab bars by the toilet and in the tub and shower. Do not use towel bars as grab bars.  Use non-skid mats or decals in the tub or shower.  If you need to sit down in  the shower, use a plastic, non-slip stool.  Keep the floor dry. Clean up any water that spills on the floor as soon as it happens.  Remove soap buildup in the tub or shower regularly.  Attach bath mats securely with double-sided non-slip rug tape.  Do not have throw rugs and other things on the floor that can make you trip. What can I do in the bedroom?  Use night lights.  Make sure that you have a light by your bed that is easy to reach.  Do not use any sheets or blankets that are too big for your bed. They should not hang down onto the floor.  Have a firm chair that has side arms. You can use this for support while you get dressed.  Do not have throw rugs and other things on the floor that can make you trip. What can I do in the kitchen?  Clean up any spills right away.  Avoid walking on wet floors.  Keep items that you use a lot in easy-to-reach places.  If you need to reach something above you, use a strong step stool that has a grab bar.  Keep electrical cords out of the way.  Do not use floor polish or wax that  makes floors slippery. If you must use wax, use non-skid floor wax.  Do not have throw rugs and other things on the floor that can make you trip. What can I do with my stairs?  Do not leave any items on the stairs.  Make sure that there are handrails on both sides of the stairs and use them. Fix handrails that are broken or loose. Make sure that handrails are as long as the stairways.  Check any carpeting to make sure that it is firmly attached to the stairs. Fix any carpet that is loose or worn.  Avoid having throw rugs at the top or bottom of the stairs. If you do have throw rugs, attach them to the floor with carpet tape.  Make sure that you have a light switch at the top of the stairs and the bottom of the stairs. If you do not have them, ask someone to add them for you. What else can I do to help prevent falls?  Wear shoes that:  Do not have high  heels.  Have rubber bottoms.  Are comfortable and fit you well.  Are closed at the toe. Do not wear sandals.  If you use a stepladder:  Make sure that it is fully opened. Do not climb a closed stepladder.  Make sure that both sides of the stepladder are locked into place.  Ask someone to hold it for you, if possible.  Clearly mark and make sure that you can see:  Any grab bars or handrails.  First and last steps.  Where the edge of each step is.  Use tools that help you move around (mobility aids) if they are needed. These include:  Canes.  Walkers.  Scooters.  Crutches.  Turn on the lights when you go into a dark area. Replace any light bulbs as soon as they burn out.  Set up your furniture so you have a clear path. Avoid moving your furniture around.  If any of your floors are uneven, fix them.  If there are any pets around you, be aware of where they are.  Review your medicines with your doctor. Some medicines can make you feel dizzy. This can increase your chance of falling. Ask your doctor what other things that you can do to help prevent falls. This information is not intended to replace advice given to you by your health care provider. Make sure you discuss any questions you have with your health care provider. Document Released: 04/08/2009 Document Revised: 11/18/2015 Document Reviewed: 07/17/2014 Elsevier Interactive Patient Education  2017 [employer].

## 2018-07-02 NOTE — Progress Notes (Signed)
Subjective:   Sydney Mcdaniel is a 85 y.o. female who presents for Medicare Annual (Subsequent) preventive examination.  Review of Systems:  Cardiac Risk Factors include: advanced age (>40mn, >>77women);dyslipidemia;hypertension     Objective:     Vitals: BP 112/72 (BP Location: Left Arm, Patient Position: Sitting, Cuff Size: Normal)   Pulse (!) 57   Temp 97.8 F (36.6 C) (Oral)   Resp 16   Ht '5\' 1"'  (1.549 m)   Wt 138 lb 4.8 oz (62.7 kg)   LMP  (LMP Unknown)   SpO2 99%   BMI 26.13 kg/m   Body mass index is 26.13 kg/m.  Advanced Directives 07/02/2018 05/13/2018 11/08/2017 07/09/2017 03/06/2017 11/27/2016 10/02/2016  Does Patient Have a Medical Advance Directive? Yes No No No Yes Yes No;Yes  Type of AParamedicof [place] will - - - - - HPress photographer Does patient want to make changes to medical advance directive? - - No - Patient declined - - - -  Copy of HWesley Chapelin Chart? No - copy requested - - - - - No - copy requested  Would patient like information on creating a medical advance directive? - - No - Patient declined No - Patient declined - - -    Tobacco Social History   Tobacco Use  Smoking Status Never Smoker  Smokeless Tobacco Never Used     Counseling given: Not Answered   Clinical Intake:  Pre-visit preparation completed: Yes  Pain : 0-10 Pain Score: 4  Pain Type: Chronic pain Pain Location: Leg Pain Orientation: Left Pain Descriptors / Indicators: Sharp Pain Onset: More than a month ago Pain Frequency: Intermittent     Nutritional Status: BMI 25 -29 Overweight Nutritional Risks: None Diabetes: No  How often do you need to have someone help you when you read instructions, pamphlets, or other written materials from your doctor or pharmacy?: 1 - Never What is the last grade level you completed in school?: high school graduate  Interpreter Needed?: No  Information entered by :: KClemetine MarkerLPN  Past Medical History:  Diagnosis Date  . Allergy   . Anemia   . Cataract   . [place]   . Glaucoma   . H/O: hysterectomy    Total  . Hyperlipidemia   . Hypertension   . Hypothyroidism   . Impaired fasting glucose   . Lichen sclerosus   . Osteoporosis    Hips  . PAF (paroxysmal atrial fibrillation) ([place]    a. 06/15/2016 Event monitor: 4% afib burden; b. CHA2DS2VASc - 4-->Xarelto.  . Syncope    a. 03/2016 Echo: EF55-60%, no rwma, mild AI/MR, nl PASP; b. 03/2016 48h Holter: no significant arrhythmias/pauses; c. 03/2016 MV: mild apical defect, likely breast attenuation, nl EF, low risk; d. 05/2016 Event monitor: No significant arrhythmia; e. 05/2016 Event monitor: PAF (4%).   Past Surgical History:  Procedure Laterality Date  . APPENDECTOMY    . EYE SURGERY     Glaucoma  . TOTAL ABDOMINAL HYSTERECTOMY     Family History  Problem Relation Age of Onset  . Heart attack Mother   . Glaucoma Mother   . Heart attack Father   . Parkinson's disease Brother   . Heart attack Brother    Social History   Socioeconomic History  . Marital status: Widowed    Spouse name: Not on file  . Number of children: 2  . Years  of education: Not on file  . Highest education level: High school graduate  Occupational History  . Occupation: retired  Scientific laboratory technician  . Financial resource strain: Not hard at all  . Food insecurity:    Worry: Never true    Inability: Never true  . Transportation needs:    Medical: No    Non-medical: No  Tobacco Use  . Smoking status: Never Smoker  . Smokeless tobacco: Never Used  Substance and Sexual Activity  . Alcohol use: No  . Drug use: No  . Sexual activity: Not Currently  Lifestyle  . Physical activity:    Days per week: 7 days    Minutes per session: 30 min  . Stress: Not at all  Relationships  . Social connections:    Talks on phone: More than three times a week    Gets together: More than three times  a week    Attends religious service: More than 4 times per year    Active member of club or organization: No    Attends meetings of clubs or organizations: Never    Relationship status: Widowed  Other Topics Concern  . Not on file  Social History Narrative  . Not on file    Outpatient Encounter Medications as of 07/02/2018  Medication Sig  . Calcium Carb-Cholecalciferol (CALCIUM 600 + D) 600-200 MG-UNIT TABS Take by mouth.  Marland Kitchen ACCU-CHEK SOFTCLIX LANCETS lancets Use as instructed; check once a day; H40.1131  . amLODipine (NORVASC) 2.5 MG tablet TAKE 1 TABLET BY MOUTH ONCE DAILY  . atorvastatin (LIPITOR) 20 MG tablet TAKE 1 TABLET BY MOUTH AT BEDTIME  . Calcium Carb-Cholecalciferol (CALCIUM 600 + D PO) Take by mouth daily.  . cholecalciferol (VITAMIN D) 1000 units tablet Take 1,000 Units by mouth daily.  . Cyanocobalamin (B-12) 1000 MCG/ML KIT Inject 1,000 mcg as directed every 30 (thirty) days.  Marland Kitchen glucose blood (ACCU-CHEK AVIVA PLUS) test strip Use as instructed, check once a day; H40.1131  . IRON PO Take 1 tablet by mouth 2 (two) times daily.   Marland Kitchen latanoprost (XALATAN) 0.005 % ophthalmic solution Place 1 drop into the right eye at bedtime.   Marland Kitchen levothyroxine (SYNTHROID, LEVOTHROID) 75 MCG tablet Take one pill by mouth on Mondays, Wednesdays, Fridays, and Sundays  . levothyroxine (SYNTHROID, LEVOTHROID) 88 MCG tablet Take one by mouth on Tuesdays, Thursdays, and Saturdays  . lisinopril (PRINIVIL,ZESTRIL) 5 MG tablet Take 5 mg (1 tablet) by mouth daily if Systolic HY>850. Take 2.5 mg (1/2 tablet) by mouth daily is Systolic BP> 277.  . Multiple Vitamins-Minerals (MULTIVITAMIN ADULT PO) Take by mouth.  . Omega-3 Fatty Acids (FISH OIL) 1200 MG CAPS Take by mouth daily.  . rivaroxaban (XARELTO) 20 MG TABS tablet Take 1 tablet (20 mg total) by mouth daily with supper.  . timolol (TIMOPTIC) 0.5 % ophthalmic solution Place 1 drop into the right eye 2 (two) times daily.   . [DISCONTINUED] ipratropium  (ATROVENT) 0.03 % nasal spray Place 2 sprays into both nostrils 3 (three) times daily.  . [DISCONTINUED] metroNIDAZOLE (METROCREAM) 0.75 % cream Apply topically 2 (two) times daily. (Patient not taking: Reported on 05/13/2018)   No facility-administered encounter medications on file as of 07/02/2018.     Activities of Daily Living In your present state of health, do you have any difficulty performing the following activities: 07/02/2018  Hearing? N  Comment decliines hearing aids  Vision? N  Comment wears glasses  Difficulty concentrating or making decisions? N  Walking  or climbing stairs? Y  Dressing or bathing? N  Doing errands, shopping? Y  Comment pt's daughter accompanies her by driving her  Preparing Food and eating ? N  Using the Toilet? N  In the past six months, have you accidently leaked urine? Y  Comment wears pads for protection  Do you have problems with loss of bowel control? N  Managing your Medications? N  Managing your Finances? N  Housekeeping or managing your Housekeeping? N  Some recent data might be hidden    Patient Care Team: Lada, Satira Anis, MD as PCP - General (Family Medicine) End, Harrell Gave, MD as PCP - Cardiology (Cardiology) Wende Bushy, MD as Consulting Physician (Cardiology) Lequita Asal, MD as Referring Physician (Hematology and Oncology) Anell Barr, OD (Optometry) Gae Dry, MD as Referring Physician (Obstetrics and Gynecology) Delana Meyer, Dolores Lory, MD (Vascular Surgery)    Assessment:   This is a routine wellness examination for [place].  Exercise Activities and Dietary recommendations Current Exercise Habits: Home exercise routine, Type of exercise: Other - see comments(physical therapy exercises), Time (Minutes): 30, Frequency (Times/Week): 7, Weekly Exercise (Minutes/Week): 210, Intensity: Mild, Exercise limited by: neurologic condition(s);orthopedic condition(s)  Goals    . Prevent falls     Continue physical therapy to  increase balance and strengthening exercises       Fall Risk Fall Risk  07/02/2018 06/27/2017 06/22/2016 03/22/2016 02/25/2016  Falls in the past year? 1 Yes Yes No No  Number falls in past yr: '1 1 2 ' or more - -  Comment - - Patient is risk for falls  - -  Injury with Fall? 1 No Yes - -  Comment fell on door threshold with face onto kitchen counter, went to ER - November 29th,  Patient has falls every month , patient fell hit her head on december 9th  - -  Risk Factor Category  - - High Fall Risk - -  Risk for fall due to : History of fall(s);Impaired balance/gait;Impaired mobility - History of fall(s) - -  Follow up Falls evaluation completed;Falls prevention discussed - Education provided - -   FALL RISK PREVENTION PERTAINING TO THE HOME:  Any stairs in or around the home WITH handrails? Yes  Home free of loose throw rugs in walkways, pet beds, electrical cords, etc? Yes  Adequate lighting in your home to reduce risk of falls? Yes   ASSISTIVE DEVICES UTILIZED TO PREVENT FALLS:  Life alert? Yes  Use of a cane, walker or w/c? Yes  Grab bars in the bathroom? Yes  Shower chair or bench in shower? Yes  Elevated toilet seat or a handicapped toilet? Yes   DME ORDERS:  DME order needed?  No   TIMED UP AND GO:  Was the test performed? Yes .  Length of time to ambulate 10 feet: 8 sec.   GAIT:  Appearance of gait: Gait slow, steady and with the use of an assistive device.  Education: Fall risk prevention has been discussed.  Intervention(s) required? No   Depression Screen PHQ 2/9 Scores 07/02/2018 06/27/2017 06/22/2016 03/22/2016  PHQ - 2 Score 0 0 0 0  PHQ- 9 Score - - - -     Cognitive Function     6CIT Screen 07/02/2018 06/27/2017  What Year? 0 points 0 points  What month? 0 points 0 points  What time? 0 points 0 points  Count back from 20 0 points 2 points  Months in reverse 0 points 0 points  Repeat phrase 2 points 6 points  Total Score 2 8    Immunization History    Administered Date(s) Administered  . Influenza, High Dose Seasonal PF 03/22/2016, 03/24/2017, 04/01/2018  . Influenza,inj,Quad PF,6+ Mos 04/13/2015  . Influenza-Unspecified 04/11/2012, 03/10/2014, 03/24/2017  . Pneumococcal Conjugate-13 12/10/2013  . Pneumococcal Polysaccharide-23 04/13/2008  . Td 01/22/2004  . Tdap 06/27/2017  . Zoster 04/10/2006    Qualifies for Shingles Vaccine? Yes  Zostavax completed 2007. Due for Shingrix. Education has been provided regarding the importance of this vaccine. Pt has been advised to call insurance company to determine out of pocket expense. Advised may also receive vaccine at local pharmacy or Health Dept. Verbalized acceptance and understanding.  Tdap: Up to date  Flu Vaccine: Up to date  Pneumococcal Vaccine: Up to date  Screening Tests Health Maintenance  Topic Date Due  . TETANUS/TDAP  06/28/2027  . INFLUENZA VACCINE  Completed  . PNA vac Low Risk Adult  Completed  . DEXA SCAN  Addressed    Cancer Screenings:  Colorectal Screening: Completed 2003. No longer required.   Mammogram:  No longer required.  Bone Density: Completed 2016. Results unavailable in immediate chart. Repeat every 2 years. Ordered today. Pt provided with contact information and advised to call to schedule appt.   Lung Cancer Screening: (Low Dose CT Chest recommended if Age 35-80 years, 30 pack-year currently smoking OR have quit w/in 15years.) does not qualify.   Additional Screening:  Hepatitis C Screening: no longer required  Vision Screening: Recommended annual ophthalmology exams for early detection of glaucoma and other disorders of the eye. Is the patient up to date with their annual eye exam?  Yes  Who is the provider or what is the name of the office in which the pt attends annual eye exams? Dr. Ellin Mayhew  Dental Screening: Recommended annual dental exams for proper oral hygiene  Community Resource Referral:  CRR required this visit?  No       Plan:    I have personally reviewed and addressed the Medicare Annual Wellness questionnaire and have noted the following in the patient's chart:  A. Medical and social history B. Use of alcohol, tobacco or illicit drugs  C. Current medications and supplements D. Functional ability and status E.  Nutritional status F.  Physical activity G. Advance directives H. List of other physicians I.  Hospitalizations, surgeries, and ER visits in previous 12 months J.  [place] such as hearing and vision if needed, cognitive and depression L. Referrals and appointments   In addition, I have reviewed and discussed with patient certain preventive protocols, quality metrics, and best practice recommendations. A written personalized care plan for preventive services as well as general preventive health recommendations were provided to patient.   Signed,  Clemetine Marker, LPN Nurse Health Advisor   Nurse Notes: Pt has not been seen in office since last AWV in January 2019. She is due for labs which were drawn today. Pt being followed by oncology, neurology and cardiology. Advised she may need office visit with Dr. Sanda Klein for follow up. She is currently doing physical therapy for gait and strength training and leg pain seems to be improving. [name] her daughter today and they are appreciative of visit.

## 2018-07-03 LAB — BASIC METABOLIC PANEL WITH GFR
BUN: 15 mg/dL (ref 7–25)
CHLORIDE: 102 mmol/L (ref 98–110)
CO2: 27 mmol/L (ref 20–32)
Calcium: 9.1 mg/dL (ref 8.6–10.4)
Creat: 0.72 mg/dL (ref 0.60–0.88)
GFR, Est African American: 85 mL/min/{1.73_m2} (ref >=60)
GFR, Est Non African American: 73 mL/min/{1.73_m2} (ref >=60)
GLUCOSE: 96 mg/dL (ref 65–99)
Potassium: 4.8 mmol/L (ref 3.5–5.3)
Sodium: 136 mmol/L (ref 135–146)

## 2018-07-03 LAB — LIPID PANEL
Cholesterol: 174 mg/dL (ref <200)
HDL: 57 mg/dL (ref >50)
LDL Cholesterol (Calc): 90 mg/dL (calc)
Non-HDL Cholesterol (Calc): 117 mg/dL (calc) (ref <130)
Total CHOL/HDL Ratio: 3.1 (calc) (ref <5.0)
Triglycerides: 173 mg/dL — ABNORMAL HIGH (ref <150)

## 2018-07-03 LAB — HEMOGLOBIN A1C
Hgb A1c MFr Bld: 6 % of total Hgb — ABNORMAL HIGH (ref <5.7)
Mean Plasma Glucose: 126 (calc)
eAG (mmol/L): 7 (calc)

## 2018-07-03 NOTE — Telephone Encounter (Signed)
Error- please disregard

## 2018-07-09 ENCOUNTER — Other Ambulatory Visit: Payer: Self-pay | Admitting: *Deleted

## 2018-07-09 MED ORDER — RIVAROXABAN 20 MG PO TABS: 20.0000 mg | ORAL_TABLET | Freq: Every day | ORAL | 2 refills | Status: DC

## 2018-07-09 NOTE — Telephone Encounter (Signed)
Xarelto 20mg  paper refill request received from [facility]; pt is 85 yrs old, wt-62.8kg, Crea-0.72 on 07/02/2018, last seen by Dr. Saunders Revel on 04/19/2018, CrCl-50.45ml/min; will send in refill to requested pharmacy.

## 2018-07-09 NOTE — Addendum Note (Signed)
Addended by: Derrel Nip B on: 07/09/2018 12:35 PM   Modules accepted: Orders

## 2018-07-15 ENCOUNTER — Inpatient Hospital Stay: Payer: Medicare Other | Attending: Hematology and Oncology

## 2018-07-15 DIAGNOSIS — E538 Deficiency of other specified B group vitamins: Secondary | ICD-10-CM | POA: Diagnosis not present

## 2018-07-15 MED ORDER — CYANOCOBALAMIN 1000 MCG/ML IJ SOLN
1000.0000 ug | Freq: Once | INTRAMUSCULAR | Status: AC
  Administered 2018-07-15: 1000 ug via INTRAMUSCULAR

## 2018-08-05 NOTE — Progress Notes (Signed)
Follow-up Outpatient Visit Date: 08/07/2018  Primary Care Provider: Arnetha Courser, MD [address] [place] [address]  Chief Complaint: Follow-up PAF and HFpEF  HPI:  Sydney Mcdaniel is a 85 y.o. year-old female with history of  recurrent falls felt to be due to orthostatic hypotension and vasovagal syncope, paroxysmal atrial fibrillation, HFpEF, HTN, CLL, vitamin B12 deficiency, and anemia, who presents for follow-up of syncope and atrial fibrillation.  I last saw Sydney Mcdaniel in late October, at which time she was feeling well other than bilateral proximal thigh achiness and weakness when first getting up in the morning.  We did not make any medication changes or pursue further testing at that time.  However, I advised her to let us know if her leg weakness persists in order to consider statin holiday.  Today, Sydney Mcdaniel reports that she is doing fairly well.  She still has intermittent leg pain, left greater than right.  This has improved with physical therapy.  She also uses acetaminophen as needed with temporary relief.  She has not had any palpitations or lightheadedness.  Her balance remains off at times.  She generally ambulates with a cane.  She has not had chest pain, shortness of breath, or edema.  She remains compliant with rivaroxaban and has not had any bleeding or falls since our last visit.  She notes rare "funny" episodes during which it seems like she is looking through water.  Episodes resolve after a few minutes with sitting down and drinking Gatorade or [facility].  Sydney Mcdaniel continues to monitor her blood pressure at home.  Readings are typically in the 130-150/50-70 range.  --------------------------------------------------------------------------------------------------  Cardiovascular History & Procedures: Cardiovascular Problems:  Recurrent syncope  Paroxysmal atrial fibrillation  HFpEF  Risk Factors:  Hypertension and age greater than  53  Cath/PCI:  None  CV Surgery:  None  EP Procedures and Devices:  Event monitor (06/15/16): Dominantly sinus rhythm with paroxysmal atrial fibrillation (4% burden). No significant pauses.  Event monitor(06/02/16): Normal sinus rhythm with first-degree AV block. No significant arrhythmia.  Holter monitor (04/17/16): Predominantly sinus rhythm with first-degree AV block. PACs and short atrial runs lasting up to 4 beats noted. Rare PVCs.  Non-Invasive Evaluation(s):  Carotid Doppler (04/30/17): Moderate (40-59%) proximal ICA stenoses bilaterally.  Greater than 50% left external carotid artery stenosis.  No significant change from prior study in 04/2016.  TTE (04/17/16): Normal LV size. LVEF 55-60%. Normal wall motion and diastolic function. Mild aortic and mitral regurgitation. Normal RV size and function. Normal pulmonary artery pressure.  Pharmacologic MPI (04/10/16): Low risk study with small fixed defect at the apex, likely due to breast attenuation. No ischemia. LVEF 55-65%.  Recent CV Pertinent Labs: Lab Results  Component Value Date   CHOL 174 07/02/2018   CHOL 141 08/24/2015   HDL 57 07/02/2018   HDL 52 08/24/2015   LDLCALC 90 07/02/2018   TRIG 173 (H) 07/02/2018   CHOLHDL 3.1 07/02/2018   K 4.8 07/02/2018   MG 2.2 12/21/2015   BUN 15 07/02/2018   BUN 14 03/12/2018   CREATININE 0.72 07/02/2018    Past medical and surgical history were reviewed and updated in EPIC.  Current Meds  Medication Sig  . ACCU-CHEK SOFTCLIX LANCETS lancets Use as instructed; check once a day; H40.1131  . amLODipine (NORVASC) 2.5 MG tablet TAKE 1 TABLET BY MOUTH ONCE DAILY  . atorvastatin (LIPITOR) 20 MG tablet TAKE 1 TABLET BY MOUTH AT BEDTIME  . Calcium  Carb-Cholecalciferol (CALCIUM 600 + D PO) Take by mouth daily.  . Calcium Carb-Cholecalciferol (CALCIUM 600 + D) 600-200 MG-UNIT TABS Take by mouth.  . cholecalciferol (VITAMIN D) 1000 units tablet Take 1,000 Units by  mouth daily.  . Cyanocobalamin (B-12) 1000 MCG/ML KIT Inject 1,000 mcg as directed every 30 (thirty) days.  Marland Kitchen glucose blood (ACCU-CHEK AVIVA PLUS) test strip Use as instructed, check once a day; H40.1131  . IRON PO Take 1 tablet by mouth 2 (two) times daily.   Marland Kitchen latanoprost (XALATAN) 0.005 % ophthalmic solution Place 1 drop into the right eye at bedtime.   Marland Kitchen levothyroxine (SYNTHROID, LEVOTHROID) 75 MCG tablet Take one pill by mouth on Mondays, Wednesdays, Fridays, and Sundays  . levothyroxine (SYNTHROID, LEVOTHROID) 88 MCG tablet Take one by mouth on Tuesdays, Thursdays, and Saturdays  . lisinopril (PRINIVIL,ZESTRIL) 5 MG tablet Take 5 mg (1 tablet) by mouth daily if Systolic ZJ>673. Take 2.5 mg (1/2 tablet) by mouth daily is Systolic BP> 419.  . Multiple Vitamins-Minerals (MULTIVITAMIN ADULT PO) Take by mouth.  . Omega-3 Fatty Acids (FISH OIL) 1200 MG CAPS Take by mouth daily.  . rivaroxaban (XARELTO) 20 MG TABS tablet Take 1 tablet (20 mg total) by mouth daily with supper.  . timolol (TIMOPTIC) 0.5 % ophthalmic solution Place 1 drop into the right eye 2 (two) times daily.     Allergies: Other; Alphagan [brimonidine]; Pravachol [pravastatin sodium]; and Pravastatin  Social History   Tobacco Use  . Smoking status: Never Smoker  . Smokeless tobacco: Never Used  Substance Use Topics  . Alcohol use: No  . Drug use: No    Family History  Problem Relation Age of Onset  . Heart attack Mother   . Glaucoma Mother   . Heart attack Father   . Parkinson's disease Brother   . Heart attack Brother     Review of Systems: A 12-system review of systems was performed and was negative except as noted in the HPI.  --------------------------------------------------------------------------------------------------  Physical Exam: BP (!) 160/60 (BP Location: Left Arm, Patient Position: Sitting, Cuff Size: Normal)   Pulse 60   Ht '5\' 2"'  (1.575 m)   Wt 137 lb (62.1 kg)   LMP  (LMP Unknown)   BMI  25.06 kg/m   General: NAD.  Accompanied by her daughter. HEENT: No conjunctival pallor or scleral icterus. Moist mucous membranes.  OP clear. Neck: Supple without lymphadenopathy, thyromegaly, JVD, or HJR. Lungs: Normal work of breathing. Clear to auscultation bilaterally without wheezes or crackles. Heart: Regular rate and rhythm without murmurs, rubs, or gallops. Non-displaced PMI. Abd: Bowel sounds present. Soft, NT/ND without hepatosplenomegaly Ext: No lower extremity edema. Radial, PT, and DP pulses are 2+ bilaterally. Skin: Warm and dry without rash.  EKG: Normal sinus rhythm with first-degree AV block (PR interval 240 seconds).  Otherwise, no significant abnormality.  Lab Results  Component Value Date   WBC 7.3 05/13/2018   HGB 11.0 (L) 05/13/2018   HCT 33.4 (L) 05/13/2018   MCV 93.8 05/13/2018   PLT 150 05/13/2018    Lab Results  Component Value Date   NA 136 07/02/2018   K 4.8 07/02/2018   CL 102 07/02/2018   CO2 27 07/02/2018   BUN 15 07/02/2018   CREATININE 0.72 07/02/2018   GLUCOSE 96 07/02/2018   ALT 32 05/13/2018    Lab Results  Component Value Date   CHOL 174 07/02/2018   HDL 57 07/02/2018   LDLCALC 90 07/02/2018   TRIG 173 (H)  07/02/2018   CHOLHDL 3.1 07/02/2018    --------------------------------------------------------------------------------------------------  ASSESSMENT AND PLAN: Paroxysmal atrial fibrillation No obvious symptoms to suggest recurrence.  EKG today shows normal sinus rhythm.  We will continue long-term anticoagulation with rivaroxaban unless recurrent falls or bleeding develop.  We will defer rate controlling agents at this time given borderline low resting heart rate.  Vasovagal near syncope No recurrence.  I have encouraged adequate hydration.  HFpEF Sydney Mcdaniel appears euvolemic and well compensated.  Her weight is stable.  No medication changes at this time.  Leg pain Most likely musculoskeletal.  Given that it has not  completely resolved, we will pursue a 1 month statin holiday.  If her leg pain improves with this, we will need to discuss an alternative agent given history of carotid artery stenosis.  If there is no noticeable improvement with holding atorvastatin, she should restart the medication.  Hypertension Blood pressure moderately elevated today but typically upper normal to mildly elevated at home.  Given history of labile blood pressure and intermittent dizziness with vasovagal episodes, we will defer medication changes today.  She should continue low-dose amlodipine as well as as needed lisinopril for systolic blood pressure greater than 140 to 160 mmHg.  Follow-up: Return to clinic in 6 months.  Nelva Bush, MD 08/07/2018 9:03 AM

## 2018-08-07 ENCOUNTER — Encounter: Payer: Self-pay | Admitting: Internal Medicine

## 2018-08-07 ENCOUNTER — Ambulatory Visit (INDEPENDENT_AMBULATORY_CARE_PROVIDER_SITE_OTHER): Payer: Medicare Other | Admitting: Internal Medicine

## 2018-08-07 VITALS — BP 160/60 | HR 60 | Ht 62.0 in | Wt 137.0 lb

## 2018-08-07 DIAGNOSIS — M79604 Pain in right leg: Secondary | ICD-10-CM | POA: Diagnosis not present

## 2018-08-07 DIAGNOSIS — R55 Syncope and collapse: Secondary | ICD-10-CM

## 2018-08-07 DIAGNOSIS — I5032 Chronic diastolic (congestive) heart failure: Secondary | ICD-10-CM

## 2018-08-07 DIAGNOSIS — M79605 Pain in left leg: Secondary | ICD-10-CM | POA: Diagnosis not present

## 2018-08-07 DIAGNOSIS — I10 Essential (primary) hypertension: Secondary | ICD-10-CM | POA: Diagnosis not present

## 2018-08-07 DIAGNOSIS — I480 Paroxysmal atrial fibrillation: Secondary | ICD-10-CM | POA: Diagnosis not present

## 2018-08-07 NOTE — Patient Instructions (Signed)
Medication Instructions:  Your physician recommends that you continue on your current medications as directed. Please refer to the Current Medication list given to you today.  You may not take your Atorvastatin for about 1 month to see if your aches and pains go away. If they do not, then you should resume the Atorvastatin.   If they do go away, then give [place] a call to determine what we should do next.   If you need a refill on your cardiac medications before your next appointment, please call your pharmacy.   Lab work: none If you have labs (blood work) drawn today and your tests are completely normal, you will receive your results only by: Marland Kitchen MyChart Message (if you have MyChart) OR . A paper copy in the mail If you have any lab test that is abnormal or we need to change your treatment, we will call you to review the results.  Testing/Procedures: none  Follow-Up: At [facility], you and your health needs are our priority.  As part of our continuing mission to provide you with exceptional heart care, we have created designated Provider Care Teams.  These Care Teams include your primary Cardiologist (physician) and Advanced Practice Providers (APPs -  Physician Assistants and Nurse Practitioners) who all work together to provide you with the care you need, when you need it. You will need a follow up appointment in 6 months.  Please call our office 2 months in advance to schedule this appointment.  You may see Nelva Bush, MD or one of the following Advanced Practice Providers on your designated Care Team:   [name], NP Christell Faith, PA-C . Marrianne Mood, PA-C

## 2018-08-12 ENCOUNTER — Inpatient Hospital Stay: Payer: Medicare Other | Attending: Hematology and Oncology

## 2018-08-12 DIAGNOSIS — E538 Deficiency of other specified B group vitamins: Secondary | ICD-10-CM | POA: Diagnosis not present

## 2018-08-12 MED ORDER — CYANOCOBALAMIN 1000 MCG/ML IJ SOLN
1000.0000 ug | Freq: Once | INTRAMUSCULAR | Status: AC
  Administered 2018-08-12: 1000 ug via INTRAMUSCULAR

## 2018-08-19 ENCOUNTER — Ambulatory Visit
Admission: RE | Admit: 2018-08-19 | Discharge: 2018-08-19 | Disposition: A | Discharge status: Home / Self Care | Payer: Medicare Other | Source: Ambulatory Visit | Attending: Family Medicine | Admitting: Family Medicine

## 2018-08-19 DIAGNOSIS — M810 Age-related osteoporosis without current pathological fracture: Secondary | ICD-10-CM | POA: Diagnosis not present

## 2018-08-19 DIAGNOSIS — Z780 Asymptomatic menopausal state: Secondary | ICD-10-CM

## 2018-08-21 ENCOUNTER — Encounter: Payer: Self-pay | Admitting: Family Medicine

## 2018-08-21 DIAGNOSIS — M810 Age-related osteoporosis without current pathological fracture: Secondary | ICD-10-CM | POA: Insufficient documentation

## 2018-08-22 ENCOUNTER — Encounter: Payer: Self-pay | Admitting: Family Medicine

## 2018-08-22 ENCOUNTER — Ambulatory Visit (INDEPENDENT_AMBULATORY_CARE_PROVIDER_SITE_OTHER): Payer: Medicare Other | Admitting: Family Medicine

## 2018-08-22 VITALS — BP 130/68 | HR 58 | Temp 98.3°F | Ht 61.0 in | Wt 138.2 lb

## 2018-08-22 DIAGNOSIS — E871 Hypo-osmolality and hyponatremia: Secondary | ICD-10-CM | POA: Diagnosis not present

## 2018-08-22 DIAGNOSIS — M810 Age-related osteoporosis without current pathological fracture: Secondary | ICD-10-CM

## 2018-08-22 DIAGNOSIS — I10 Essential (primary) hypertension: Secondary | ICD-10-CM

## 2018-08-22 DIAGNOSIS — I6523 Occlusion and stenosis of bilateral carotid arteries: Secondary | ICD-10-CM | POA: Diagnosis not present

## 2018-08-22 MED ORDER — ALENDRONATE SODIUM 70 MG PO TABS: 70.0000 mg | ORAL_TABLET | ORAL | 11 refills | Status: DC

## 2018-08-22 MED ORDER — FAMOTIDINE 20 MG PO TABS: 20.0000 mg | ORAL_TABLET | Freq: Every day | ORAL | 5 refills | Status: DC

## 2018-08-22 NOTE — Progress Notes (Signed)
BP 130/68   Pulse (!) 58   Temp 98.3 F (36.8 C)   Ht 5\' 1"  (1.549 m)   Wt 138 lb 3.2 oz (62.7 kg)   LMP  (LMP Unknown)   SpO2 96%   BMI 26.11 kg/m    Subjective:    Patient ID: Sydney Mcdaniel, female    DOB: [birth year], 85 y.o.   MRN: [id number]  HPI: Sydney Mcdaniel is a 85 y.o. female  Chief Complaint  Patient presents with  . Follow-up    bone density results  . Osteoporosis    HPI She is here with her daughter Osteoporosis  New problems Just had the DEXA scan She fell in September, hit her head and then hit the floor; fell on her right hip but no fracture No recurrent steroid use She takes a calcium pill every day 600 mg; check bottle to see if two pills are one serving of 600 mg Taking multivitamin with calcium Getting greens; dairy products Taking vitamin D Never took any medicine for her bones No issues with acid reflux or heartburn No blood in her stool  Got checked out by her cardiologist then Woke up and had pain in her legs; that happened in November; after her fall; right leg is now okay and then got in to see urgent ortho place, [facility]; they did xrays and said she has lots of arthriits; the left hip is the worst; did PT twice a week for 4 weeks; still doing those exercises Went to cardiologist and he stopped her lipitor on Feb 12th, but no difference yet  HTN; was having low pressures and cardiologist changed up medicines  On blood thinner; no bleeding from nose, gums, urine, rectum; taking Xarelto at night but spits up after taking just for a while, then saw the pulmonologist and they could not find anything wrong; then it quit  Depression screen [facility] 2/9 08/22/2018 07/02/2018 06/27/2017 06/22/2016 03/22/2016  Decreased Interest 0 0 0 0 0  Down, Depressed, Hopeless 0 0 0 0 0  PHQ - 2 Score 0 0 0 0 0  Altered sleeping 0 - - - -  Tired, decreased energy 0 - - - -  Change in appetite 0 - - - -  Feeling bad or failure about yourself  0 - - - -    Trouble concentrating 0 - - - -  Moving slowly or fidgety/restless 0 - - - -  Suicidal thoughts 0 - - - -  PHQ-9 Score 0 - - - -  Difficult doing work/chores Not difficult at all - - - -   Fall Risk  08/22/2018 07/02/2018 06/27/2017 06/22/2016 03/22/2016  Falls in the past year? 0 1 Yes Yes No  Number falls in past yr: - 1 1 2  or more -  Comment - - - Patient is risk for falls  -  Injury with Fall? - 1 No Yes -  Comment - fell on door threshold with face onto kitchen counter, went to ER - November 29th,  Patient has falls every month , patient fell hit her head on december 9th  -  Risk Factor Category  - - - High Fall Risk -  Risk for fall due to : - History of fall(s);Impaired balance/gait;Impaired mobility - History of fall(s) -  Follow up - Falls evaluation completed;Falls prevention discussed - Education provided -    Relevant past medical, surgical, family and social history reviewed Past Medical History:  Diagnosis Date  .  Allergy   . Anemia   . Cataract   . CLL (chronic lymphocytic leukemia) ([place])   . Glaucoma   . H/O: hysterectomy    Total  . Hyperlipidemia   . Hypertension   . Hypothyroidism   . Impaired fasting glucose   . Lichen sclerosus   . Osteoporosis    Hips  . PAF (paroxysmal atrial fibrillation) ([place])    a. 06/15/2016 Event monitor: 4% afib burden; b. CHA2DS2VASc - 4-->Xarelto.  . Syncope    a. 03/2016 Echo: EF55-60%, no rwma, mild AI/MR, nl PASP; b. 03/2016 48h Holter: no significant arrhythmias/pauses; c. 03/2016 MV: mild apical defect, likely breast attenuation, nl EF, low risk; d. 05/2016 Event monitor: No significant arrhythmia; e. 05/2016 Event monitor: PAF (4%).   Past Surgical History:  Procedure Laterality Date  . APPENDECTOMY    . EYE SURGERY     Glaucoma  . TOTAL ABDOMINAL HYSTERECTOMY     Family History  Problem Relation Age of Onset  . Heart attack Mother   . Glaucoma Mother   . Heart attack Father   . Parkinson's disease Brother   .  Heart attack Brother    Social History   Tobacco Use  . Smoking status: Never Smoker  . Smokeless tobacco: Never Used  Substance Use Topics  . Alcohol use: No  . Drug use: No     [facility] Visit from 08/22/2018 in [facility]  AUDIT-C Score  0      Interim medical history since last visit reviewed. Allergies and medications reviewed  Review of Systems Per HPI unless specifically indicated above     Objective:    BP 130/68   Pulse (!) 58   Temp 98.3 F (36.8 C)   Ht 5\' 1"  (1.549 m)   Wt 138 lb 3.2 oz (62.7 kg)   LMP  (LMP Unknown)   SpO2 96%   BMI 26.11 kg/m   Wt Readings from Last 3 Encounters:  08/22/18 138 lb 3.2 oz (62.7 kg)  08/07/18 137 lb (62.1 kg)  07/02/18 138 lb 4.8 oz (62.7 kg)    Physical Exam Constitutional:      Appearance: She is well-developed.  Eyes:     General: No scleral icterus. Cardiovascular:     Rate and Rhythm: Normal rate and regular rhythm.  Pulmonary:     Effort: Pulmonary effort is normal.     Breath sounds: Normal breath sounds.  Neurological:     Mental Status: She is alert.     Comments: Ambulatory with cane  Psychiatric:        Mood and Affect: Mood is not anxious or depressed.        Behavior: Behavior normal.     Results for orders placed or performed in visit on 07/02/18  Lipid panel  Result Value Ref Range   Cholesterol 174 <200 mg/dL   HDL 57 >50 mg/dL   Triglycerides 173 (H) <150 mg/dL   LDL Cholesterol (Calc) 90 mg/dL (calc)   Total CHOL/HDL Ratio 3.1 <5.0 (calc)   Non-HDL Cholesterol (Calc) 117 <130 mg/dL (calc)  BASIC METABOLIC PANEL WITH GFR  Result Value Ref Range   Glucose, Bld 96 65 - 99 mg/dL   BUN 15 7 - 25 mg/dL   Creat 0.72 0.60 - 0.88 mg/dL   GFR, Est Non African American 73 > OR = 60 mL/min/1.14m2   GFR, Est African American 85 > OR = 60 mL/min/1.25m2   BUN/Creatinine Ratio NOT APPLICABLE 6 -  22 (calc)   Sodium 136 135 - 146 mmol/L   Potassium 4.8 3.5 - 5.3 mmol/L    Chloride 102 98 - 110 mmol/L   CO2 27 20 - 32 mmol/L   Calcium 9.1 8.6 - 10.4 mg/dL  Hemoglobin A1c  Result Value Ref Range   Hgb A1c MFr Bld 6.0 (H) <5.7 % of total Hgb   Mean Plasma Glucose 126 (calc)   eAG (mmol/L) 7.0 (calc)      Assessment & Plan:   Problem List Items Addressed This Visit      Cardiovascular and Mediastinum   Essential hypertension    Good control today        Musculoskeletal and Integument   Osteoporosis - Primary    Lengthy discussion about disease, osteoporosis, risk of fracture, calcium, vit D; treatment with alendronate, will add H2 blocker for stomach protection; risk of bleeding discussed, especially with her blood thinner; reasons to stop immediately and seek medical attention reviewed; stressed importance of properly taking med, irritation or ulceration of esophagus, stomach if not taken with full glass of water, do not lay back down for an hour after taking, etc. fully discussed with patient and daughter; plan to re-image in one year to assess efficacy; fall precautions discussed as well; see AVS; opportunity for questions, patient and daughter agree with plan      Relevant Medications   alendronate (FOSAMAX) 70 MG tablet   Other Relevant Orders   VITAMIN D 25 Hydroxy (Vit-D Deficiency, Fractures)     Other   Hyponatremia    Chart review showed her last Na+ level was normal          Follow up plan: Return in about 1 year (around 09/01/2019) for follow-up visit with Dr. Sanda Klein.  An after-visit summary was printed and given to the patient at [facility].  Please see the patient instructions which may contain other information and recommendations beyond what is mentioned above in the assessment and plan.  Meds ordered this encounter  Medications  . alendronate (FOSAMAX) 70 MG tablet    Sig: Take 1 tablet (70 mg total) by mouth every 7 (seven) days. Take with a full glass of water on an empty stomach.    Dispense:  4 tablet    Refill:  11  .  famotidine (PEPCID) 20 MG tablet    Sig: Take 1 tablet (20 mg total) by mouth at bedtime.    Dispense:  30 tablet    Refill:  5    Orders Placed This Encounter  Procedures  . VITAMIN D 25 Hydroxy (Vit-D Deficiency, Fractures)

## 2018-08-22 NOTE — Assessment & Plan Note (Signed)
Lengthy discussion about disease, osteoporosis, risk of fracture, calcium, vit D; treatment with alendronate, will add H2 blocker for stomach protection; risk of bleeding discussed, especially with her blood thinner; reasons to stop immediately and seek medical attention reviewed; stressed importance of properly taking med, irritation or ulceration of esophagus, stomach if not taken with full glass of water, do not lay back down for an hour after taking, etc. fully discussed with patient and daughter; plan to re-image in one year to assess efficacy; fall precautions discussed as well; see AVS; opportunity for questions, patient and daughter agree with plan

## 2018-08-22 NOTE — Patient Instructions (Addendum)
Practice good fall precautions Start the new medicine for the bones, once a week with full glass of water Start the new medicine for stomach protection, night is recommended Take your iron pill and orange juice in the mid-afternoon  Next bone density study on or after August 25, 2019 Appointment with me a few days later to review  Fall Prevention in the Home, Adult Falls can cause injuries. They can happen to people of all ages. There are many things you can do to make your home safe and to help prevent falls. Ask for help when making these changes, if needed. What actions can I take to prevent falls? General Instructions  Use good lighting in all rooms. Replace any light bulbs that burn out.  Turn on the lights when you go into a dark area. Use night-lights.  Keep items that you use often in easy-to-reach places. Lower the shelves around your home if necessary.  Set up your furniture so you have a clear path. Avoid moving your furniture around.  Do not have throw rugs and other things on the floor that can make you trip.  Avoid walking on wet floors.  If any of your floors are uneven, fix them.  Add color or contrast paint or tape to clearly mark and help you see: ? Any grab bars or handrails. ? First and last steps of stairways. ? Where the edge of each step is.  If you use a stepladder: ? Make sure that it is fully opened. Do not climb a closed stepladder. ? Make sure that both sides of the stepladder are locked into place. ? Ask someone to hold the stepladder for you while you use it.  If there are any pets around you, be aware of where they are. What can I do in the bathroom?      Keep the floor dry. Clean up any water that spills onto the floor as soon as it happens.  Remove soap buildup in the tub or shower regularly.  Use non-skid mats or decals on the floor of the tub or shower.  Attach bath mats securely with double-sided, non-slip rug tape.  If you need to  sit down in the shower, use a plastic, non-slip stool.  Install grab bars by the toilet and in the tub and shower. Do not use towel bars as grab bars. What can I do in the bedroom?  Make sure that you have a light by your bed that is easy to reach.  Do not use any sheets or blankets that are too big for your bed. They should not hang down onto the floor.  Have a firm chair that has side arms. You can use this for support while you get dressed. What can I do in the kitchen?  Clean up any spills right away.  If you need to reach something above you, use a strong step stool that has a grab bar.  Keep electrical cords out of the way.  Do not use floor polish or wax that makes floors slippery. If you must use wax, use non-skid floor wax. What can I do with my stairs?  Do not leave any items on the stairs.  Make sure that you have a light switch at the top of the stairs and the bottom of the stairs. If you do not have them, ask someone to add them for you.  Make sure that there are handrails on both sides of the stairs, and use them. Fix  handrails that are broken or loose. Make sure that handrails are as long as the stairways.  Install non-slip stair treads on all stairs in your home.  Avoid having throw rugs at the top or bottom of the stairs. If you do have throw rugs, attach them to the floor with carpet tape.  Choose a carpet that does not hide the edge of the steps on the stairway.  Check any carpeting to make sure that it is firmly attached to the stairs. Fix any carpet that is loose or worn. What can I do on the outside of my home?  Use bright outdoor lighting.  Regularly fix the edges of walkways and driveways and fix any cracks.  Remove anything that might make you trip as you walk through a door, such as a raised step or threshold.  Trim any bushes or trees on the path to your home.  Regularly check to see if handrails are loose or broken. Make sure that both sides of  any steps have handrails.  Install guardrails along the edges of any raised decks and porches.  Clear walking paths of anything that might make someone trip, such as tools or rocks.  Have any leaves, snow, or ice cleared regularly.  Use sand or salt on walking paths during winter.  Clean up any spills in your garage right away. This includes grease or oil spills. What other actions can I take?  Wear shoes that: ? Have a low heel. Do not wear high heels. ? Have rubber bottoms. ? Are comfortable and fit you well. ? Are closed at the toe. Do not wear open-toe sandals.  Use tools that help you move around (mobility aids) if they are needed. These include: ? Canes. ? Walkers. ? Scooters. ? Crutches.  Review your medicines with your doctor. Some medicines can make you feel dizzy. This can increase your chance of falling. Ask your doctor what other things you can do to help prevent falls. Where to find more information  Centers for Disease Control and Prevention, [facility]ADI: https://garcia.biz/  [employer] on Aging: BrainJudge.co.uk Contact a doctor if:  You are afraid of falling at home.  You feel weak, drowsy, or dizzy at home.  You fall at home. Summary  There are many simple things that you can do to make your home safe and to help prevent falls.  Ways to make your home safe include removing tripping hazards and installing grab bars in the bathroom.  Ask for help when making these changes in your home. This information is not intended to replace advice given to you by your health care provider. Make sure you discuss any questions you have with your health care provider. Document Released: 04/08/2009 Document Revised: 01/25/2017 Document Reviewed: 01/25/2017 Elsevier Interactive Patient Education  2019 [employer].  Osteoporosis  Osteoporosis is thinning and loss of density in your bones. Osteoporosis makes bones more brittle and fragile and more likely to  break (fracture). Over time, osteoporosis can cause your bones to become so weak that they fracture after a minor fall. Bones in the hip, wrist, and spine are most likely to fracture due to osteoporosis. What are the causes? The exact cause of this condition is not known. What increases the risk? You may be at greater risk for osteoporosis if you:  Have a family history of the condition.  Have poor nutrition.  Use steroid medicines, such as prednisone.  Are female.  Are age 63 or older.  Smoke or have  a history of smoking.  Are not physically active (are sedentary).  Are white (Caucasian) or of Asian descent.  Have a small body frame.  Take certain medicines, such as antiseizure medicines. What are the signs or symptoms? A fracture might be the first sign of osteoporosis, especially if the fracture results from a fall or injury that usually would not cause a bone to break. Other signs and symptoms include:  Pain in the neck or low back.  Stooped posture.  Loss of height. How is this diagnosed? This condition may be diagnosed based on:  Your medical history.  A physical exam.  A bone mineral density test, also called a DXA or DEXA test (dual-energy X-ray absorptiometry test). This test uses X-rays to measure the amount of minerals in your bones. How is this treated? The goal of treatment is to strengthen your bones and lower your risk for a fracture. Treatment may involve:  Making lifestyle changes, such as: ? Including foods with more calcium and vitamin D in your diet. ? Doing weight-bearing and muscle-strengthening exercises. ? Stopping tobacco use. ? Limiting alcohol intake.  Taking medicine to slow the process of bone loss or to increase bone density.  Taking daily supplements of calcium and vitamin D.  Taking hormone replacement medicines, such as estrogen for women and testosterone for men.  Monitoring your levels of calcium and vitamin D. Follow these  instructions at home:  Activity  Exercise as told by your health care provider. Ask your health care provider what exercises and activities are safe for you. You should do: ? Exercises that make you work against gravity (weight-bearing exercises), such as tai chi, yoga, or walking. ? Exercises to strengthen muscles, such as lifting weights. Lifestyle  Limit alcohol intake to no more than 1 drink a day for nonpregnant women and 2 drinks a day for men. One drink equals 12 oz of beer, 5 oz of wine, or 1 oz of hard liquor.  Do not use any products that contain nicotine or tobacco, such as cigarettes and e-cigarettes. If you need help quitting, ask your health care provider. Preventing falls  Use devices to help you move around (mobility aids) as needed, such as canes, walkers, scooters, or crutches.  Keep rooms well-lit and clutter-free.  Remove tripping hazards from walkways, including cords and throw rugs.  Install grab bars in bathrooms and safety rails on stairs.  Use rubber mats in the bathroom and other areas that are often wet or slippery.  Wear closed-toe shoes that fit well and support your feet. Wear shoes that have rubber soles or low heels.  Review your medicines with your health care provider. Some medicines can cause dizziness or changes in blood pressure, which can increase your risk of falling. General instructions  Include calcium and vitamin D in your diet. Calcium is important for bone health, and vitamin D helps your body to absorb calcium. Good sources of calcium and vitamin D include: ? Certain fatty fish, such as salmon and tuna. ? Products that have calcium and vitamin D added to them (fortified products), such as fortified cereals. ? Egg yolks. ? Cheese. ? Liver.  Take over-the-counter and prescription medicines only as told by your health care provider.  Keep all follow-up visits as told by your health care provider. This is important. Contact a health  care provider if:  You have never been screened for osteoporosis and you are: ? A woman who is age 42 or older. ? A man  who is age 68 or older. Get help right away if:  You fall or injure yourself. Summary  Osteoporosis is thinning and loss of density in your bones. This makes bones more brittle and fragile and more likely to break (fracture),even with minor falls.  The goal of treatment is to strengthen your bones and reduce your risk for a fracture.  Include calcium and vitamin D in your diet. Calcium is important for bone health, and vitamin D helps your body to absorb calcium.  Talk with your health care provider about screening for osteoporosis if you are a woman who is age 65 or older, or a man who is age 69 or older. This information is not intended to replace advice given to you by your health care provider. Make sure you discuss any questions you have with your health care provider. Document Released: 03/22/2005 Document Revised: 04/06/2017 Document Reviewed: 04/06/2017 Elsevier Interactive Patient Education  2019 [employer].

## 2018-08-22 NOTE — Assessment & Plan Note (Signed)
Chart review showed her last Na+ level was normal

## 2018-08-22 NOTE — Assessment & Plan Note (Signed)
Good control today

## 2018-09-04 NOTE — Telephone Encounter (Signed)
Given that Ms. Verbeke did not notice any improvement in her leg pain with statin holiday and has moderate carotid artery disease, I recommend restarting atorvastatin 20 mg daily.  Nelva Bush, MD [facility] Pager: [phone]

## 2018-09-04 NOTE — Addendum Note (Signed)
Addended by: Vanessa Ralphs on: 09/04/2018 04:12 PM   Modules accepted: Orders

## 2018-09-05 MED ORDER — ATORVASTATIN CALCIUM 20 MG PO TABS: 20.0000 mg | ORAL_TABLET | Freq: Every day | ORAL | 3 refills | Status: DC

## 2018-09-05 NOTE — Addendum Note (Signed)
Addended by: Vanessa Ralphs on: 09/05/2018 08:42 AM   Modules accepted: Orders

## 2018-09-09 ENCOUNTER — Inpatient Hospital Stay: Payer: Medicare Other | Attending: Hematology and Oncology

## 2018-09-09 ENCOUNTER — Other Ambulatory Visit: Payer: Self-pay

## 2018-09-09 DIAGNOSIS — E538 Deficiency of other specified B group vitamins: Secondary | ICD-10-CM | POA: Diagnosis not present

## 2018-09-09 DIAGNOSIS — M810 Age-related osteoporosis without current pathological fracture: Secondary | ICD-10-CM | POA: Diagnosis not present

## 2018-09-09 MED ORDER — CYANOCOBALAMIN 1000 MCG/ML IJ SOLN
1000.0000 ug | Freq: Once | INTRAMUSCULAR | Status: AC
  Administered 2018-09-09: 1000 ug via INTRAMUSCULAR

## 2018-09-10 ENCOUNTER — Encounter: Payer: Self-pay | Admitting: Family Medicine

## 2018-09-10 LAB — VITAMIN D 25 HYDROXY (VIT D DEFICIENCY, FRACTURES): Vit D, 25-Hydroxy: 35 ng/mL (ref 30–100)

## 2018-09-16 ENCOUNTER — Other Ambulatory Visit: Payer: Self-pay | Admitting: Family Medicine

## 2018-09-16 ENCOUNTER — Other Ambulatory Visit: Payer: Self-pay | Admitting: *Deleted

## 2018-09-16 DIAGNOSIS — E039 Hypothyroidism, unspecified: Secondary | ICD-10-CM

## 2018-09-16 MED ORDER — LISINOPRIL 5 MG PO TABS: ORAL_TABLET | ORAL | 2 refills | Status: DC

## 2018-10-04 ENCOUNTER — Telehealth: Payer: Self-pay

## 2018-10-04 NOTE — Telephone Encounter (Signed)
VM left informing daughter that patient's B12 injection would be skipped this month d/t limiting possible exposure with coronavirus. Advised patient would resume normal schedule next month. Number provided should any questions arise.

## 2018-10-07 ENCOUNTER — Inpatient Hospital Stay: Payer: Medicare Other | Attending: Hematology and Oncology

## 2018-10-30 ENCOUNTER — Other Ambulatory Visit: Payer: Self-pay | Admitting: Family Medicine

## 2018-10-30 DIAGNOSIS — E039 Hypothyroidism, unspecified: Secondary | ICD-10-CM

## 2018-11-05 ENCOUNTER — Encounter: Payer: Self-pay | Admitting: Hematology and Oncology

## 2018-11-11 ENCOUNTER — Other Ambulatory Visit: Payer: Medicare Other

## 2018-11-11 ENCOUNTER — Ambulatory Visit: Payer: Medicare Other | Admitting: Hematology and Oncology

## 2018-11-11 ENCOUNTER — Ambulatory Visit: Payer: Medicare Other

## 2018-11-20 DIAGNOSIS — E119 Type 2 diabetes mellitus without complications: Secondary | ICD-10-CM | POA: Diagnosis not present

## 2018-11-20 DIAGNOSIS — H401132 Primary open-angle glaucoma, bilateral, moderate stage: Secondary | ICD-10-CM | POA: Diagnosis not present

## 2018-11-21 ENCOUNTER — Ambulatory Visit: Payer: Self-pay | Admitting: Family Medicine

## 2018-11-21 NOTE — Chronic Care Management (AMB) (Signed)
Chronic Care Management   Note  11/21/2018 Name: [name] MRN: [id number] DOB: [birth year]  Sydney Mcdaniel is a 85 y.o. year old female who is a primary care patient of Lada, Satira Anis, MD. I reached out to [name] by phone today in response to a referral sent by [name]'s health plan.    [name] was given information about Chronic Care Management services today including:  1. CCM service includes personalized support from designated clinical staff supervised by her physician, including individualized plan of care and coordination with other care providers 2. 24/7 contact phone numbers for assistance for urgent and routine care needs. 3. Service will only be billed when office clinical staff spend 20 minutes or more in a month to coordinate care. 4. Only one practitioner may furnish and bill the service in a calendar month. 5. The patient may stop CCM services at any time (effective at the end of the month) by phone call to the office staff. 6. The patient will be responsible for cost sharing (co-pay) of up to 20% of the service fee (after annual deductible is met).  Patient and daughter [name] did not agree to services and does not wish to consider at this time.  Follow up plan: The care management team is available to follow up with the patient after provider conversation with the patient regarding recommendation for care management engagement and subsequent re-referral to the care management team.   [place]  [email or web address]   ??[phone]

## 2018-11-26 ENCOUNTER — Encounter: Payer: Self-pay | Admitting: Family Medicine

## 2018-12-09 ENCOUNTER — Ambulatory Visit: Payer: Medicare Other | Admitting: Hematology and Oncology

## 2018-12-09 ENCOUNTER — Other Ambulatory Visit: Payer: Medicare Other

## 2018-12-09 ENCOUNTER — Ambulatory Visit: Payer: Medicare Other

## 2019-01-03 NOTE — Progress Notes (Signed)
[facility]  [address], [address] [place], [place] [address] Phone: [phone]  Fax: [phone]   Clinic Day:  01/06/2019  Referring physician: Arnetha Courser, MD  Chief Complaint: Sydney Mcdaniel is a 85 y.o. female with chronic lymphocytic leukemia, B12 deficiency, and anemia who is seen for 8 month assessment.  HPI: The patient was last seen in the hematology clinic on 05/13/2018. At that time, she noted leg discomfort.  She denied any B symptoms.  Exam revealed no palpable adenopathy or hepatosplenomegaly.  Hematocrit was 33.4 and hemoglobin 11.0.  Platelet count was 150,000.  WBC 7300 ([place] 2600).  Ferritin was 98.  Folate was 38.  TSH was 3.050.  She has received B-12 monthly, last 09/09/2018 due to COVID-19.   She was seen in cardiology by Dr. Harrell Gave End on 08/07/2018. EKG was normal. She continued on anticoagulation with Xarelto.   Bone density on 08/19/2018 revealed osteoporosis with a T-score of -2.8. She was started on Fosamax with vitamin D and calcium.   During the interim, she has been "pretty good." She has arthritic left hip pain, for which she has been going to PT. Her glaucoma is stable. She denies any falls, but notes she has bad balance. She denies any fevers, sweats, weight loss, infections, bumps or lumps. She notes easy bruising on her arms. She notes mild left ankle swelling that does not go down at night.   He daughter notes she has been mildly fatigued, and that she has previously has low sodium levels.   Blood pressure was 185/63 in the clinic today. She is currently on Pepcid once daily at night. She continues on Xarelto. She takes oral iron BID.    Past Medical History:  Diagnosis Date  . Allergy   . Anemia   . Cataract   . CLL (chronic lymphocytic leukemia) ([place])   . Glaucoma   . H/O: hysterectomy    Total  . Hyperlipidemia   . Hypertension   . Hypothyroidism   . Impaired fasting glucose   . Lichen sclerosus    . Osteoporosis    Hips  . PAF (paroxysmal atrial fibrillation) ([place])    a. 06/15/2016 Event monitor: 4% afib burden; b. CHA2DS2VASc - 4-->Xarelto.  . Syncope    a. 03/2016 Echo: EF55-60%, no rwma, mild AI/MR, nl PASP; b. 03/2016 48h Holter: no significant arrhythmias/pauses; c. 03/2016 MV: mild apical defect, likely breast attenuation, nl EF, low risk; d. 05/2016 Event monitor: No significant arrhythmia; e. 05/2016 Event monitor: PAF (4%).    Past Surgical History:  Procedure Laterality Date  . APPENDECTOMY    . EYE SURGERY     Glaucoma  . TOTAL ABDOMINAL HYSTERECTOMY      Family History  Problem Relation Age of Onset  . Heart attack Mother   . Glaucoma Mother   . Heart attack Father   . Parkinson's disease Brother   . Heart attack Brother     Social History:  reports that she has never smoked. She has never used smokeless tobacco. She reports that she does not drink alcohol or use drugs. Her husband died in 01-09-2015.  She notes that she played basketball 75 years ago on a half court.  She lives in [place]. Her daughter is [name]. She is alone today with her daughter [name] over the phone [phone]).   Allergies:  Allergies  Allergen Reactions  . Other   . Alphagan [Brimonidine] Itching and Rash  . Pravachol [Pravastatin Sodium]  Itching and Rash  . Pravastatin Itching, Other (See Comments) and Rash    Current Medications: Current Outpatient Medications  Medication Sig Dispense Refill  . ACCU-CHEK SOFTCLIX LANCETS lancets Use as instructed; check once a day; H40.1131 100 each 12  . alendronate (FOSAMAX) 70 MG tablet Take 1 tablet (70 mg total) by mouth every 7 (seven) days. Take with a full glass of water on an empty stomach. 4 tablet 11  . amLODipine (NORVASC) 2.5 MG tablet TAKE 1 TABLET BY MOUTH ONCE DAILY 90 tablet 2  . atorvastatin (LIPITOR) 20 MG tablet Take 1 tablet (20 mg total) by mouth daily. 90 tablet 3  . Calcium Carb-Cholecalciferol (CALCIUM 600 + D PO) Take  1 tablet by mouth daily.     . cholecalciferol (VITAMIN D) 1000 units tablet Take 1,000 Units by mouth daily.    . Cyanocobalamin (B-12) 1000 MCG/ML KIT Inject 1,000 mcg as directed every 30 (thirty) days.    . famotidine (PEPCID) 20 MG tablet Take 1 tablet (20 mg total) by mouth at bedtime. 30 tablet 5  . glucose blood (ACCU-CHEK AVIVA PLUS) test strip Use as instructed, check once a day; H40.1131 100 each 3  . IRON PO Take 1 tablet by mouth 2 (two) times daily.     Marland Kitchen latanoprost (XALATAN) 0.005 % ophthalmic solution Place 1 drop into the right eye at bedtime.     Marland Kitchen levothyroxine (SYNTHROID) 75 MCG tablet TAKE ONE TABLET BY MOUTH ON MONDAYS, WEDNESDAYS, FRIDAYS, AND SUNDAYS. 52 tablet 0  . levothyroxine (SYNTHROID) 88 MCG tablet TAKE ONE TABLET BY MOUTH ON TUESDAYS, THURSDAYS, AND SATURDAYS. 39 tablet 0  . lisinopril (PRINIVIL,ZESTRIL) 5 MG tablet Take 5 mg (1 tablet) by mouth daily if Systolic YD>741. Take 2.5 mg (1/2 tablet) by mouth daily is Systolic BP> 287. 90 tablet 2  . Multiple Vitamins-Minerals (MULTIVITAMIN ADULT PO) Take by mouth.    . Omega-3 Fatty Acids (FISH OIL) 1200 MG CAPS Take by mouth daily.    . rivaroxaban (XARELTO) 20 MG TABS tablet Take 1 tablet (20 mg total) by mouth daily with supper. 90 tablet 2  . timolol (TIMOPTIC) 0.5 % ophthalmic solution Place 1 drop into the right eye 2 (two) times daily.      No current facility-administered medications for this visit.     Review of Systems  Constitutional: Positive for malaise/fatigue (mild). Negative for chills, diaphoresis, fever and weight loss (Up 3lbs).       Doing "pretty good."  HENT: Negative.  Negative for congestion, hearing loss, nosebleeds, sinus pain and sore throat.   Eyes: Negative for blurred vision.       Glaucoma.  Respiratory: Negative for cough, hemoptysis, sputum production and shortness of breath.   Cardiovascular: Positive for leg swelling (left ankle, mild). Negative for chest pain, palpitations,  orthopnea and PND.       Atrial fibrillation.  Gastrointestinal: Negative.  Negative for abdominal pain, blood in stool, constipation, diarrhea, melena, nausea and vomiting.  Genitourinary: Negative.  Negative for dysuria, frequency, hematuria and urgency.  Musculoskeletal: Positive for joint pain (left hip, arthritic). Negative for back pain, falls and myalgias.  Skin: Negative.  Negative for itching and rash.  Neurological: Negative for tremors, sensory change, focal weakness and weakness.       Balance issues  Endo/Heme/Allergies: Bruises/bleeds easily (arms, on Xarelto).  Psychiatric/Behavioral: Negative for depression, memory loss and suicidal ideas. The patient is not nervous/anxious and does not have insomnia.   All other systems reviewed  and are negative.  Performance status (ECOG): 2  Vitals Blood pressure (!) 185/63, pulse (!) 58, temperature (!) 97.4 F (36.3 C), temperature source Tympanic, resp. rate 18, weight 141 lb 3.2 oz (64 kg), SpO2 100 %.   Physical Exam  Constitutional: She is oriented to person, place, and time. She appears well-developed and well-nourished. No distress.  Elderly woman sitting comfortably in a wheelchair in the exam room in no acute distress.  She has a cane at her side.  She requires assistance onto the exam table.  HENT:  Head: Normocephalic and atraumatic.  Mouth/Throat: Oropharynx is clear and moist. No oropharyngeal exudate.  Curly gray hair.  Eyes: Pupils are equal, round, and reactive to light. Conjunctivae and EOM are normal.  Blue eyes.   Neck: Normal range of motion. Neck supple.  Cardiovascular: Normal rate, regular rhythm and normal heart sounds.  No murmur heard. Pulmonary/Chest: Breath sounds normal. No respiratory distress. She has no wheezes.  Abdominal: Soft. Bowel sounds are normal. She exhibits no distension. There is no abdominal tenderness.  Musculoskeletal: Normal range of motion.        General: Edema (Trace 1+ left ankle  edema. ) present. No tenderness.  Lymphadenopathy:    She has no cervical adenopathy.    She has no axillary adenopathy.       Right: No supraclavicular adenopathy present.       Left: No supraclavicular adenopathy present.  Neurological: She is alert and oriented to person, place, and time.  Skin: Skin is warm and dry. She is not diaphoretic. No erythema.  Left wrist bruise.  Psychiatric: She has a normal mood and affect. Her behavior is normal. Judgment and thought content normal.  Nursing note and vitals reviewed.   Appointment on 01/06/2019  Component Date Value Ref Range Status  . Sodium 01/06/2019 128* 135 - 145 mmol/L Final  . Potassium 01/06/2019 4.7  3.5 - 5.1 mmol/L Final  . Chloride 01/06/2019 98  98 - 111 mmol/L Final  . CO2 01/06/2019 23  22 - 32 mmol/L Final  . Glucose, Bld 01/06/2019 112* 70 - 99 mg/dL Final  . BUN 01/06/2019 16  8 - 23 mg/dL Final  . Creatinine, Ser 01/06/2019 0.82  0.44 - 1.00 mg/dL Final  . Calcium 01/06/2019 8.8* 8.9 - 10.3 mg/dL Final  . GFR calc non Af Amer 01/06/2019 >60  >60 mL/min Final  . GFR calc Af Amer 01/06/2019 >60  >60 mL/min Final  . Anion gap 01/06/2019 7  5 - 15 Final   Performed at [facility], [address], [place], [place] [address]  . WBC 01/06/2019 8.0  4.0 - 10.5 K/uL Final  . RBC 01/06/2019 3.42* 3.87 - 5.11 MIL/uL Final  . Hemoglobin 01/06/2019 11.0* 12.0 - 15.0 g/dL Final  . HCT 01/06/2019 32.6* 36.0 - 46.0 % Final  . MCV 01/06/2019 95.3  80.0 - 100.0 fL Final  . MCH 01/06/2019 32.2  26.0 - 34.0 pg Final  . MCHC 01/06/2019 33.7  30.0 - 36.0 g/dL Final  . RDW 01/06/2019 13.7  11.5 - 15.5 % Final  . Platelets 01/06/2019 162  150 - 400 K/uL Final  . nRBC 01/06/2019 0.0  0.0 - 0.2 % Final  . Neutrophils Relative % 01/06/2019 34  % Final  . Neutro Abs 01/06/2019 2.7  1.7 - 7.7 K/uL Final  . Lymphocytes Relative 01/06/2019 57  % Final  . Lymphs Abs 01/06/2019 4.6* 0.7 - 4.0 K/uL Final  .  Monocytes Relative  01/06/2019 8  % Final  . Monocytes Absolute 01/06/2019 0.6  0.1 - 1.0 K/uL Final  . Eosinophils Relative 01/06/2019 1  % Final  . Eosinophils Absolute 01/06/2019 0.1  0.0 - 0.5 K/uL Final  . Basophils Relative 01/06/2019 0  % Final  . Basophils Absolute 01/06/2019 0.0  0.0 - 0.1 K/uL Final  . Immature Granulocytes 01/06/2019 0  % Final  . Abs Immature Granulocytes 01/06/2019 0.01  0.00 - 0.07 K/uL Final   Performed at [facility], [address], [place], [place] [address]    Assessment:  RENELLA STEIG is a 85 y.o. female with stage 0 CLL.  She presented with a distant history of anemia and recent lymphocytosis.  She was noted to have mild lymphocystosis in 06/22/2015.  Absolute lymphocyte count (ALC) has ranged between 4700 - 5500.  Work-up on 01/10/2016 revealed a hematocrit of 31.6, hemoglobin 11.4, MCV 89, platelets 203,000, WBC 11,00 with an ANC of 3700.  Absolute lymphocyte count was 6400.  Ferritin was 116.  B12 was 119 (low).  Folate was 44.0.  Retic was 0.9% (low).  Uric acid was 4.5.  Guaiac cards were negative x 3 in 12/2015.  She was diagnosed with B12 deficiency.  She receives B12 monthly (last 09/09/2018).  Folate was 38 on 08/07/2016.  Peripheral blood flow cytometry revealed involvement by a CD5+, CD23+, CD38- monoclonal B cell population with lambda light chain restriction consistent with CLL/SLL.  There was a small population (8% of the T cells) of double positive (CD4+/CD8+) T cells (described in association with chronic viral infections, autoimmune disorders, chronic inflammatory disorders, and immunodeficiency states).   She has a slight progressive normocytic anemia.  Work-up on 08/07/2016 revealed the following normal labs: creatinine, LDH, uric acid, ferritin (105), iron studies (12% sat; TIBC 295), and folate.  Retic was 1.1% (low for level of anemia).  Coombs was negative on 09/04/2016.  Diet is modest.  She denies any melena or hematochezia.  Guaiac  cards were negative x 3 in 07/2016.  Last colonoscopy was > 10 years ago.  Ferritin has been followed: 116 on 01/10/2016, 105 on 08/07/2016, 29 on 11/27/2016, 31 on 03/06/2017, 59 on 07/09/2017, 64 on 11/08/2017, and 98 on 05/13/2018.  She was diagnosed with atrial fibrillation after presenting with syncopal episodes.  She is on Xarelto.  Chest CT on 11/07/2016 revealed two 2 mm right lung nodules (right major fissure and lateral aspect RUL).  There was a 5 mm right lower lobe ground-glass nodule.  Chest CT on 11/05/2017 revealed 6 mm ground-glass right lower lobe nodule, stable since 11/07/2016.  Follow-up in 2 years was recommended.  There were millimetric pulmonary nodules unchanged and considered benign.   Symptomatically, notes mild fatigue.  She denies any B symptoms.  Exam reveals no adenopathy or hepatosplenomegaly.  Plan: 1.   Labs today:  CBC with diff, BMP, ferritin. 2.   CLL             Clinically, she appears to be doing well.    She denies any B symptoms.    Exam is stable.             Hematocrit 32.6.  Hemoglobin 11.0.  Platelets 162,000.              WBC 8000 (ANC of 2700; ALC 4600).  Continue surveillance. 3.   Tiny pulmonary nodules             Review chest  CT from 11/05/2017.  Surgeon made for follow-up imaging in 10/2019. 4.   B12 deficiency             B12 today and monthly x6.  Check folate periodically. 5.   Iron deficiency  Hemoglobin 11.0.  MCV 95.3.  Ferritin 81.   6.   Hyponatremia  Sodium 128.  Etiology unclear.  Discuss fluids with electrolytes as well as watching free water intake  Contact Dr. Delight Ovens office for follow-up-done. 7.    RTC in 6 months for MD assessment, labs (CBC with diff, BMP, ferritin, folate) and B12.  I discussed the assessment and treatment plan with the patient.  The patient was provided an opportunity to ask questions and all were answered.  The patient agreed with the plan and demonstrated an understanding of the instructions.   The patient was advised to call back if the symptoms worsen or if the condition fails to improve as anticipated.  I provided 20 minutes of face-to-face time during this this encounter and > 50% was spent counseling as documented under my assessment and plan.    Lequita Asal, MD, PhD    01/06/2019, 11:24 AM  I, Molly Dorshimer, am acting as Education administrator for [employer]. Mike Gip, MD, PhD.  I, Melissa C. Mike Gip, MD, have reviewed the above documentation for accuracy and completeness, and I agree with the above.

## 2019-01-06 ENCOUNTER — Telehealth: Payer: Self-pay | Admitting: Nurse Practitioner

## 2019-01-06 ENCOUNTER — Encounter: Payer: Self-pay | Admitting: Hematology and Oncology

## 2019-01-06 ENCOUNTER — Inpatient Hospital Stay (HOSPITAL_BASED_OUTPATIENT_CLINIC_OR_DEPARTMENT_OTHER): Payer: Medicare Other | Admitting: Hematology and Oncology

## 2019-01-06 ENCOUNTER — Other Ambulatory Visit: Payer: Self-pay

## 2019-01-06 ENCOUNTER — Telehealth: Payer: Self-pay

## 2019-01-06 ENCOUNTER — Inpatient Hospital Stay: Payer: Medicare Other

## 2019-01-06 ENCOUNTER — Inpatient Hospital Stay: Payer: Medicare Other | Attending: Hematology and Oncology

## 2019-01-06 VITALS — BP 185/63 | HR 58 | Temp 97.4°F | Resp 18 | Wt 141.2 lb

## 2019-01-06 DIAGNOSIS — E538 Deficiency of other specified B group vitamins: Secondary | ICD-10-CM | POA: Insufficient documentation

## 2019-01-06 DIAGNOSIS — I480 Paroxysmal atrial fibrillation: Secondary | ICD-10-CM | POA: Diagnosis not present

## 2019-01-06 DIAGNOSIS — Z7901 Long term (current) use of anticoagulants: Secondary | ICD-10-CM | POA: Insufficient documentation

## 2019-01-06 DIAGNOSIS — C9110 Chronic lymphocytic leukemia of B-cell type not having achieved remission: Secondary | ICD-10-CM

## 2019-01-06 DIAGNOSIS — E871 Hypo-osmolality and hyponatremia: Secondary | ICD-10-CM | POA: Diagnosis not present

## 2019-01-06 DIAGNOSIS — D649 Anemia, unspecified: Secondary | ICD-10-CM

## 2019-01-06 DIAGNOSIS — R918 Other nonspecific abnormal finding of lung field: Secondary | ICD-10-CM | POA: Insufficient documentation

## 2019-01-06 DIAGNOSIS — E039 Hypothyroidism, unspecified: Secondary | ICD-10-CM | POA: Insufficient documentation

## 2019-01-06 LAB — CBC WITH DIFFERENTIAL/PLATELET
Abs Immature Granulocytes: 0.01 10*3/uL (ref 0.00–0.07)
Basophils Absolute: 0 10*3/uL (ref 0.0–0.1)
Basophils Relative: 0 %
Eosinophils Absolute: 0.1 10*3/uL (ref 0.0–0.5)
Eosinophils Relative: 1 %
HCT: 32.6 % — ABNORMAL LOW (ref 36.0–46.0)
Hemoglobin: 11 g/dL — ABNORMAL LOW (ref 12.0–15.0)
Immature Granulocytes: 0 %
Lymphocytes Relative: 57 %
Lymphs Abs: 4.6 10*3/uL — ABNORMAL HIGH (ref 0.7–4.0)
MCH: 32.2 pg (ref 26.0–34.0)
MCHC: 33.7 g/dL (ref 30.0–36.0)
MCV: 95.3 fL (ref 80.0–100.0)
Monocytes Absolute: 0.6 10*3/uL (ref 0.1–1.0)
Monocytes Relative: 8 %
Neutro Abs: 2.7 10*3/uL (ref 1.7–7.7)
Neutrophils Relative %: 34 %
Platelets: 162 10*3/uL (ref 150–400)
RBC: 3.42 MIL/uL — ABNORMAL LOW (ref 3.87–5.11)
RDW: 13.7 % (ref 11.5–15.5)
WBC: 8 10*3/uL (ref 4.0–10.5)
nRBC: 0 % (ref 0.0–0.2)

## 2019-01-06 LAB — BASIC METABOLIC PANEL
Anion gap: 7 (ref 5–15)
BUN: 16 mg/dL (ref 8–23)
CO2: 23 mmol/L (ref 22–32)
Calcium: 8.8 mg/dL — ABNORMAL LOW (ref 8.9–10.3)
Chloride: 98 mmol/L (ref 98–111)
Creatinine, Ser: 0.82 mg/dL (ref 0.44–1.00)
GFR calc Af Amer: >60 mL/min (ref >60)
GFR calc non Af Amer: >60 mL/min (ref >60)
Glucose, Bld: 112 mg/dL — ABNORMAL HIGH (ref 70–99)
Potassium: 4.7 mmol/L (ref 3.5–5.1)
Sodium: 128 mmol/L — ABNORMAL LOW (ref 135–145)

## 2019-01-06 LAB — FERRITIN: Ferritin: 81 ng/mL (ref 11–307)

## 2019-01-06 MED ORDER — CYANOCOBALAMIN 1000 MCG/ML IJ SOLN
1000.0000 ug | Freq: Once | INTRAMUSCULAR | Status: AC
  Administered 2019-01-06: 12:00:00 1000 ug via INTRAMUSCULAR

## 2019-01-06 NOTE — Telephone Encounter (Signed)
Pt is coming in on Friday @ 2:40

## 2019-01-06 NOTE — Telephone Encounter (Signed)
Patient and daughter notified.

## 2019-01-06 NOTE — Telephone Encounter (Signed)
-----   Message from [place], [place] sent at 01/06/2019 11:54 AM EDT ----- Per Dr Mike Gip she would like for you to review the patient labs for hyponatremia 128 today.  Thanks [place], [place]

## 2019-01-06 NOTE — Telephone Encounter (Signed)
Labs has been routed to PCP office today.

## 2019-01-06 NOTE — Progress Notes (Signed)
Pt here for follow up. Denies any concerns.  

## 2019-01-06 NOTE — Telephone Encounter (Signed)
Kim- please ask patient to modestly increase sodium intake in diet and do not intake more than 2L of fluid total in a dya. We will schedule a follow-up this week on hyponatremia. If she develops confusion, vomiting or extreme fatigue please have her follow-up with ER.   Melissa- will you schedule her with me on Friday?

## 2019-01-10 ENCOUNTER — Ambulatory Visit (INDEPENDENT_AMBULATORY_CARE_PROVIDER_SITE_OTHER): Payer: Medicare Other | Admitting: Nurse Practitioner

## 2019-01-10 ENCOUNTER — Other Ambulatory Visit: Payer: Self-pay

## 2019-01-10 ENCOUNTER — Encounter: Payer: Self-pay | Admitting: Nurse Practitioner

## 2019-01-10 VITALS — BP 150/64 | HR 97 | Temp 97.1°F | Resp 14 | Ht 61.0 in | Wt 142.5 lb

## 2019-01-10 DIAGNOSIS — C9110 Chronic lymphocytic leukemia of B-cell type not having achieved remission: Secondary | ICD-10-CM

## 2019-01-10 DIAGNOSIS — M810 Age-related osteoporosis without current pathological fracture: Secondary | ICD-10-CM

## 2019-01-10 DIAGNOSIS — E039 Hypothyroidism, unspecified: Secondary | ICD-10-CM | POA: Diagnosis not present

## 2019-01-10 DIAGNOSIS — I10 Essential (primary) hypertension: Secondary | ICD-10-CM

## 2019-01-10 DIAGNOSIS — I5032 Chronic diastolic (congestive) heart failure: Secondary | ICD-10-CM

## 2019-01-10 DIAGNOSIS — I480 Paroxysmal atrial fibrillation: Secondary | ICD-10-CM | POA: Diagnosis not present

## 2019-01-10 DIAGNOSIS — E871 Hypo-osmolality and hyponatremia: Secondary | ICD-10-CM | POA: Diagnosis not present

## 2019-01-10 DIAGNOSIS — E7800 Pure hypercholesterolemia, unspecified: Secondary | ICD-10-CM

## 2019-01-10 NOTE — Progress Notes (Signed)
Name: Sydney Mcdaniel   MRN: [id number]    DOB: 1924-10-02   Date:01/10/2019       Progress Note  Subjective  Chief Complaint  Chief Complaint  Patient presents with  . Follow-up    HPI  Patient had hyponatremia at lab check with oncologist last week.  She was instructed to limit fluids and mildly increased sodium intake. Endorses chronic fatigue and muscle cramps mostly in left leg. Denies headache, fatigue, lethargy, nausea, vomiting, dizziness, gait disturbances, forgetfulness, confusion, and diffuse muscle cramps.    Hyperlipidemia Patient rx atorvastatin 20 mg nightly, she went on drug holiday due to myalgia however this was specific to left upper hip, do not think this is related to Lipitor has not improved as she has stopped atorvastatin.  Informed her to discuss this with cardiologist may want to restart.  Hypertension Patient is on amlodipine 2.5 mg daily, lisinopril 5 mg daily.  Takes medications as prescribed with no missed doses a month.  She has increased sodium due to hyponatremia modestly and limited fluids to under 2 L. Denies chest pain, headaches, blurry vision. Her blood pressure, atrial fibrillation and heart failure is followed by cardiology, Dr. and.  She additionally takes Xarelto for prevention due to atrial fibrillation.  No shortness of breath, chest pain, peripheral edema, palpitations. Hypothyroidism Patient alternates between levothyroxine 75 mcg daily and 88 mcg daily.  This dose is well well for her in the past she does however note chronic fatigue. Patient denies palpitations, insomnia, constipation/diarrhea, hot/cold intolerances,   Lab Results  Component Value Date   TSH 3.050 05/13/2018    Osteoporosis  Takes fosamax Walks around house all the time  CLL (chronic lymphocytic leukemia)  Follows up with oncology- Dr. Mike Gip; patient states last visit received a B12 injection.  Labs are routinely checked by oncology.  PHQ2/9: Depression  screen [facility] 2/9 01/10/2019 08/22/2018 07/02/2018 06/27/2017 06/22/2016  Decreased Interest 0 0 0 0 0  Down, Depressed, Hopeless 0 0 0 0 0  PHQ - 2 Score 0 0 0 0 0  Altered sleeping 0 0 - - -  Tired, decreased energy 0 0 - - -  Change in appetite 0 0 - - -  Feeling bad or failure about yourself  0 0 - - -  Trouble concentrating 0 0 - - -  Moving slowly or fidgety/restless 0 0 - - -  Suicidal thoughts 0 0 - - -  PHQ-9 Score 0 0 - - -  Difficult doing work/chores Not difficult at all Not difficult at all - - -    PHQ reviewed. Negative  Patient Active Problem List   Diagnosis Date Noted  . Osteoporosis 08/21/2018  . Osteoarthritis of left hip 05/21/2018  . Bilateral carotid artery stenosis 04/19/2018  . History of syncope 10/17/2017  . MCI (mild cognitive impairment) 08/12/2017  . Lung nodules 07/09/2017  . PAF (paroxysmal atrial fibrillation) ([place]) 04/18/2017  . Pleural effusion 10/20/2016  . Hyponatremia 10/20/2016  . Chronic diastolic congestive heart failure ([place]) 06/28/2016  . Primary open-angle glaucoma, bilateral, mild stage 05/08/2016  . Labile blood glucose 04/27/2016  . Bladder prolapse, female, acquired 02/25/2016  . Urinary tract infection 02/25/2016  . 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  . B12 deficiency 01/11/2016  . Anemia 01/10/2016  . CLL (chronic lymphocytic leukemia) ([place]) 01/10/2016  . Hyperkalemia 12/22/2015  . 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 12/21/2015  . Bradycardia 12/21/2015  .  Lymphocytosis 06/25/2015  . Impaired fasting glucose 06/22/2015  . Carotid atherosclerosis 06/22/2015  . Essential hypertension 12/22/2014  . Hypercholesteremia 12/22/2014  . Hypothyroidism 12/22/2014    Past Medical History:  Diagnosis Date  . Allergy   . Anemia   . Cataract   . CLL (chronic lymphocytic leukemia) ([place])   . Glaucoma   . H/O: hysterectomy    Total  . Hyperlipidemia   . Hypertension   .  Hypothyroidism   . Impaired fasting glucose   . Lichen sclerosus   . Osteoporosis    Hips  . PAF (paroxysmal atrial fibrillation) ([place])    a. 06/15/2016 Event monitor: 4% afib burden; b. CHA2DS2VASc - 4-->Xarelto.  . Syncope    a. 03/2016 Echo: EF55-60%, no rwma, mild AI/MR, nl PASP; b. 03/2016 48h Holter: no significant arrhythmias/pauses; c. 03/2016 MV: mild apical defect, likely breast attenuation, nl EF, low risk; d. 05/2016 Event monitor: No significant arrhythmia; e. 05/2016 Event monitor: PAF (4%).    Past Surgical History:  Procedure Laterality Date  . APPENDECTOMY    . EYE SURGERY     Glaucoma  . TOTAL ABDOMINAL HYSTERECTOMY      Social History   Tobacco Use  . Smoking status: Never Smoker  . Smokeless tobacco: Never Used  Substance Use Topics  . Alcohol use: No     Current Outpatient Medications:  .  alendronate (FOSAMAX) 70 MG tablet, Take 1 tablet (70 mg total) by mouth every 7 (seven) days. Take with a full glass of water on an empty stomach., Disp: 4 tablet, Rfl: 11 .  amLODipine (NORVASC) 2.5 MG tablet, TAKE 1 TABLET BY MOUTH ONCE DAILY, Disp: 90 tablet, Rfl: 2 .  Calcium Carb-Cholecalciferol (CALCIUM 600 + D PO), Take 1 tablet by mouth daily. , Disp: , Rfl:  .  cholecalciferol (VITAMIN D) 1000 units tablet, Take 1,000 Units by mouth daily., Disp: , Rfl:  .  Cyanocobalamin (B-12) 1000 MCG/ML KIT, Inject 1,000 mcg as directed every 30 (thirty) days., Disp: , Rfl:  .  famotidine (PEPCID) 20 MG tablet, Take 1 tablet (20 mg total) by mouth at bedtime., Disp: 30 tablet, Rfl: 5 .  IRON PO, Take 1 tablet by mouth 2 (two) times daily. , Disp: , Rfl:  .  latanoprost (XALATAN) 0.005 % ophthalmic solution, Place 1 drop into the right eye at bedtime. , Disp: , Rfl:  .  levothyroxine (SYNTHROID) 75 MCG tablet, TAKE ONE TABLET BY MOUTH ON MONDAYS, WEDNESDAYS, FRIDAYS, AND SUNDAYS., Disp: 52 tablet, Rfl: 0 .  levothyroxine (SYNTHROID) 88 MCG tablet, TAKE ONE TABLET BY MOUTH ON  TUESDAYS, THURSDAYS, AND SATURDAYS., Disp: 39 tablet, Rfl: 0 .  lisinopril (PRINIVIL,ZESTRIL) 5 MG tablet, Take 5 mg (1 tablet) by mouth daily if Systolic VX>793. Take 2.5 mg (1/2 tablet) by mouth daily is Systolic BP> 903., Disp: 90 tablet, Rfl: 2 .  Multiple Vitamins-Minerals (MULTIVITAMIN ADULT PO), Take by mouth., Disp: , Rfl:  .  Omega-3 Fatty Acids (FISH OIL) 1200 MG CAPS, Take by mouth daily., Disp: , Rfl:  .  rivaroxaban (XARELTO) 20 MG TABS tablet, Take 1 tablet (20 mg total) by mouth daily with supper., Disp: 90 tablet, Rfl: 2 .  timolol (TIMOPTIC) 0.5 % ophthalmic solution, Place 1 drop into the right eye 2 (two) times daily. , Disp: , Rfl:  .  ACCU-CHEK SOFTCLIX LANCETS lancets, Use as instructed; check once a day; H40.1131, Disp: 100 each, Rfl: 12 .  atorvastatin (LIPITOR) 20 MG tablet, Take 1  tablet (20 mg total) by mouth daily., Disp: 90 tablet, Rfl: 3 .  glucose blood (ACCU-CHEK AVIVA PLUS) test strip, Use as instructed, check once a day; H40.1131, Disp: 100 each, Rfl: 3  Allergies  Allergen Reactions  . Other   . Alphagan [Brimonidine] Itching and Rash  . Pravachol [Pravastatin Sodium] Itching and Rash  . Pravastatin Itching, Other (See Comments) and Rash    ROS    No other specific complaints in a complete review of systems (except as listed in HPI above).  Objective  Vitals:   01/10/19 1452 01/10/19 1457  BP: (!) 162/64 (!) 150/64  Pulse: 97   Resp: 14   Temp: (!) 97.1 F (36.2 C)   TempSrc: Temporal   SpO2: 99%   Weight: 142 lb 8 oz (64.6 kg)   Height: '5\' 1"'  (1.549 m)      Body mass index is 26.93 kg/m.  Nursing Note and Vital Signs reviewed.  Physical Exam Vitals signs reviewed.  Constitutional:      Appearance: She is well-developed.  HENT:     Head: Normocephalic and atraumatic.  Neck:     Musculoskeletal: Normal range of motion and neck supple.     Vascular: No carotid bruit.  Cardiovascular:     Heart sounds: Normal heart sounds.      Comments: Trace peripheral edema  Pulmonary:     Effort: Pulmonary effort is normal.     Breath sounds: Normal breath sounds.  Abdominal:     General: Bowel sounds are normal.     Palpations: Abdomen is soft.     Tenderness: There is no abdominal tenderness.  Musculoskeletal: Normal range of motion.  Skin:    General: Skin is warm and dry.     Capillary Refill: Capillary refill takes less than 2 seconds.  Neurological:     Mental Status: She is alert and oriented to person, place, and time.     GCS: GCS eye subscore is 4. GCS verbal subscore is 5. GCS motor subscore is 6.     Sensory: No sensory deficit.  Psychiatric:        Speech: Speech normal.        Behavior: Behavior normal.        Thought Content: Thought content normal.        Judgment: Judgment normal.        No results found for this or any previous visit (from the past 48 hour(s)).  Assessment & Plan  1. Hyponatremia Fluid restriction, modest increase in electrolyte replacement- monitor levels today  - COMPLETE METABOLIC PANEL WITH GFR - TSH  2. Chronic diastolic congestive heart failure (HCC) Stable, follow-up with cards 3. Essential hypertension Elevated, however due to previous hypotension will allow modest elevations, follow-up with cards  4. PAF (paroxysmal atrial fibrillation) (HCC) Stable, follow-up with cards  5. Hypothyroidism, unspecified type - TSH  6. Age-related osteoporosis without current pathological fracture Continue supplementation   7. CLL (chronic lymphocytic leukemia) ([place]) Follow-up with oncology   8. Hypercholesteremia Consider re-starting statin.

## 2019-01-10 NOTE — Patient Instructions (Signed)
Hyponatremia Hyponatremia is when the amount of salt (sodium) in your blood is too low. When salt levels are low, your body may take in extra water. This can cause swelling throughout the body. The swelling often affects the brain. What are the causes? This condition may be caused by:  Certain medical problems or conditions.  Vomiting a lot.  Having watery poop (diarrhea) often.  Certain medicines or illegal drugs.  Not having enough water in the body (dehydration).  Drinking too much water.  Eating a diet that is low in salt.  Large burns on your body.  Too much sweating. What increases the risk? You are more likely to get this condition if you:  Have long-term (chronic) kidney disease.  Have heart failure.  Have a medical condition that causes you to have watery poop often.  Do very hard exercises.  Take medicines that affect the amount of salt is in your blood. What are the signs or symptoms? Symptoms of this condition include:  Headache.  Feeling like you may vomit (nausea).  Vomiting.  Being very tired (lethargic).  Muscle weakness and cramps.  Not wanting to eat as much as normal (loss of appetite).  Feeling weak or light-headed. Severe symptoms of this condition include:  Confusion.  Feeling restless (agitation).  Having a fast heart rate.  Passing out (fainting).  Seizures.  Coma. How is this treated? Treatment for this condition depends on the cause. Treatment may include:  Getting fluids through an IV tube that is put into one of your veins.  Taking medicines to fix the salt levels in your blood. If medicines are causing the problem, your medicines will need to be changed.  Limiting how much water or fluid you take in.  Monitoring in the hospital to watch your symptoms. Follow these instructions at home:   Take over-the-counter and prescription medicines only as told by your doctor. Many medicines can make this condition worse.  Talk with your doctor about any medicines that you are taking.  Eat and drink exactly as you are told by your doctor. ? Eat only the foods you are told to eat. ? Limit how much fluid you take.  Do not drink alcohol.  Keep all follow-up visits as told by your doctor. This is important. Contact a doctor if:  You feel more like you may vomit.  You feel more tired.  Your headache gets worse.  You feel more confused.  You feel weaker.  Your symptoms go away and then they come back.  You have trouble following the diet instructions. Get help right away if:  You have a seizure.  You pass out.  You keep having watery poop.  You keep vomiting. Summary  Hyponatremia is when the amount of salt in your blood is too low.  When salt levels are low, you can have swelling throughout the body. The swelling mostly affects the brain.  Treatment depends on the cause. Treatment may include getting IV fluids, medicines, or not drinking as much fluid. This information is not intended to replace advice given to you by your health care provider. Make sure you discuss any questions you have with your health care provider. Document Released: 02/22/2011 Document Revised: 08/29/2018 Document Reviewed: 05/16/2018 Elsevier Patient Education  2020 [employer]  

## 2019-01-11 LAB — COMPLETE METABOLIC PANEL WITH GFR
AG Ratio: 1.5 (calc) (ref 1.0–2.5)
ALT: 18 U/L (ref 6–29)
AST: 21 U/L (ref 10–35)
Albumin: 4.1 g/dL (ref 3.6–5.1)
Alkaline phosphatase (APISO): 20 U/L — ABNORMAL LOW (ref 37–153)
BUN/Creatinine Ratio: 21 (calc) (ref 6–22)
BUN: 19 mg/dL (ref 7–25)
CO2: 26 mmol/L (ref 20–32)
Calcium: 9.3 mg/dL (ref 8.6–10.4)
Chloride: 100 mmol/L (ref 98–110)
Creat: 0.92 mg/dL — ABNORMAL HIGH (ref 0.60–0.88)
GFR, Est African American: 63 mL/min/{1.73_m2} (ref >=60)
GFR, Est Non African American: 54 mL/min/{1.73_m2} — ABNORMAL LOW (ref >=60)
Globulin: 2.7 g/dL (calc) (ref 1.9–3.7)
Glucose, Bld: 102 mg/dL — ABNORMAL HIGH (ref 65–99)
Potassium: 4.7 mmol/L (ref 3.5–5.3)
Sodium: 133 mmol/L — ABNORMAL LOW (ref 135–146)
Total Bilirubin: 0.4 mg/dL (ref 0.2–1.2)
Total Protein: 6.8 g/dL (ref 6.1–8.1)

## 2019-01-11 LAB — TSH: TSH: 1.88 mIU/L (ref 0.40–4.50)

## 2019-01-13 ENCOUNTER — Other Ambulatory Visit: Payer: Self-pay | Admitting: Nurse Practitioner

## 2019-01-13 DIAGNOSIS — E871 Hypo-osmolality and hyponatremia: Secondary | ICD-10-CM

## 2019-01-27 DIAGNOSIS — E871 Hypo-osmolality and hyponatremia: Secondary | ICD-10-CM | POA: Diagnosis not present

## 2019-01-27 LAB — BASIC METABOLIC PANEL WITH GFR
BUN: 14 mg/dL (ref 7–25)
CO2: 25 mmol/L (ref 20–32)
Calcium: 9.2 mg/dL (ref 8.6–10.4)
Chloride: 101 mmol/L (ref 98–110)
Creat: 0.81 mg/dL (ref 0.60–0.88)
GFR, Est African American: 74 mL/min/{1.73_m2} (ref >=60)
GFR, Est Non African American: 63 mL/min/{1.73_m2} (ref >=60)
Glucose, Bld: 108 mg/dL — ABNORMAL HIGH (ref 65–99)
Potassium: 5.3 mmol/L (ref 3.5–5.3)
Sodium: 134 mmol/L — ABNORMAL LOW (ref 135–146)

## 2019-01-31 ENCOUNTER — Other Ambulatory Visit: Payer: Self-pay

## 2019-02-03 ENCOUNTER — Inpatient Hospital Stay: Payer: Medicare Other | Attending: Hematology and Oncology

## 2019-02-03 ENCOUNTER — Other Ambulatory Visit: Payer: Self-pay

## 2019-02-03 VITALS — BP 175/83 | HR 61 | Resp 20

## 2019-02-03 DIAGNOSIS — E538 Deficiency of other specified B group vitamins: Secondary | ICD-10-CM | POA: Diagnosis not present

## 2019-02-03 MED ORDER — CYANOCOBALAMIN 1000 MCG/ML IJ SOLN
1000.0000 ug | Freq: Once | INTRAMUSCULAR | Status: AC
  Administered 2019-02-03: 1000 ug via INTRAMUSCULAR

## 2019-02-06 ENCOUNTER — Other Ambulatory Visit: Payer: Self-pay

## 2019-02-06 ENCOUNTER — Encounter: Payer: Self-pay | Admitting: Internal Medicine

## 2019-02-06 ENCOUNTER — Ambulatory Visit (INDEPENDENT_AMBULATORY_CARE_PROVIDER_SITE_OTHER): Payer: Medicare Other | Admitting: Internal Medicine

## 2019-02-06 VITALS — BP 158/50 | HR 49 | Ht 62.0 in | Wt 136.0 lb

## 2019-02-06 DIAGNOSIS — M25552 Pain in left hip: Secondary | ICD-10-CM

## 2019-02-06 DIAGNOSIS — I480 Paroxysmal atrial fibrillation: Secondary | ICD-10-CM | POA: Diagnosis not present

## 2019-02-06 DIAGNOSIS — I10 Essential (primary) hypertension: Secondary | ICD-10-CM

## 2019-02-06 DIAGNOSIS — I5032 Chronic diastolic (congestive) heart failure: Secondary | ICD-10-CM

## 2019-02-06 DIAGNOSIS — E785 Hyperlipidemia, unspecified: Secondary | ICD-10-CM

## 2019-02-06 NOTE — Patient Instructions (Signed)
Medication Instructions:  Your physician recommends that you continue on your current medications as directed. Please refer to the Current Medication list given to you today.  If you need a refill on your cardiac medications before your next appointment, please call your pharmacy.   Lab work: None ordered If you have labs (blood work) drawn today and your tests are completely normal, you will receive your results only by: . MyChart Message (if you have MyChart) OR . A paper copy in the mail If you have any lab test that is abnormal or we need to change your treatment, we will call you to review the results.  Testing/Procedures: None ordered  Follow-Up: At [facility], you and your health needs are our priority.  As part of our continuing mission to provide you with exceptional heart care, we have created designated Provider Care Teams.  These Care Teams include your primary Cardiologist (physician) and Advanced Practice Providers (APPs -  Physician Assistants and Nurse Practitioners) who all work together to provide you with the care you need, when you need it. You will need a follow up appointment in 6 months.  Please call our office 2 months in advance to schedule this appointment.  You may see Christopher End, MD or one of the following Advanced Practice Providers on your designated Care Team:   Christopher Berge, NP Ryan Dunn, PA-C . Jacquelyn Visser, PA-C     

## 2019-02-06 NOTE — Progress Notes (Signed)
Follow-up Outpatient Visit Date: 02/06/2019  Primary Care Provider: Arnetha Courser, MD [address] [place] [address]  Chief Complaint: Follow-up atrial fibrillation and HFpEF  HPI:  Ms. Prout is a 85 y.o. year-old female with history of recurrent falls felt to be due to orthostatic hypotension and vasovagal syncope,paroxysmal atrial fibrillation, HFpEF, HTN, CLL, vitamin B12 deficiency, and anemia, who presents for follow-up of syncope and atrial fibrillation.  I last saw Ms. Thomassen in February, at which time she was doing well other than intermittent leg pain (L>R).  She also noted a few "funny" episides during which she felt like she was "looking through water."  We agreed to a statin holiday for 1 month to see if that would help improve her leg pain.  However, given no change in symptoms, we restarted atorvastatin 20 mg daily.  Today, Ms. Hansman is doing well other than continued left hip pain.  As noted above, did not improve with statin holiday.  It is most noticeable when she first gets up to walk after sitting for extended periods.  She has some chronic swelling of the left leg, which is unchanged.  She denies chest pain, shortness of breath, palpitations, lightheadedness, and orthopnea.  Her weight has also been stable.  She ambulates with a cane and has not had any falls.  She has not had any bleeding, remaining on rivaroxaban.  --------------------------------------------------------------------------------------------------  Cardiovascular History & Procedures: Cardiovascular Problems:  Recurrent syncope  Paroxysmal atrial fibrillation  HFpEF  Risk Factors:  Hypertension and age greater than 60  Cath/PCI:  None  CV Surgery:  None  EP Procedures and Devices:  Event monitor (06/15/16): Dominantly sinus rhythm with paroxysmal atrial fibrillation (4% burden). No significant pauses.  Event monitor(06/02/16): Normal sinus rhythm with  first-degree AV block. No significant arrhythmia.  Holter monitor (04/17/16): Predominantly sinus rhythm with first-degree AV block. PACs and short atrial runs lasting up to 4 beats noted. Rare PVCs.  Non-Invasive Evaluation(s):  Carotid Doppler (04/30/17): Moderate (40-59%) proximal ICA stenoses bilaterally. Greater than 50% left external carotid artery stenosis. No significant change from prior study in 04/2016.  TTE (04/17/16): Normal LV size. LVEF 55-60%. Normal wall motion and diastolic function. Mild aortic and mitral regurgitation. Normal RV size and function. Normal pulmonary artery pressure.  Pharmacologic MPI (04/10/16): Low risk study with small fixed defect at the apex, likely due to breast attenuation. No ischemia. LVEF 55-65%.  Recent CV Pertinent Labs: Lab Results  Component Value Date   CHOL 174 07/02/2018   CHOL 141 08/24/2015   HDL 57 07/02/2018   HDL 52 08/24/2015   LDLCALC 90 07/02/2018   TRIG 173 (H) 07/02/2018   CHOLHDL 3.1 07/02/2018   K 5.3 01/27/2019   MG 2.2 12/21/2015   BUN 14 01/27/2019   BUN 14 03/12/2018   CREATININE 0.81 01/27/2019    Past medical and surgical history were reviewed and updated in EPIC.  Current Meds  Medication Sig  . ACCU-CHEK SOFTCLIX LANCETS lancets Use as instructed; check once a day; H40.1131  . alendronate (FOSAMAX) 70 MG tablet Take 1 tablet (70 mg total) by mouth every 7 (seven) days. Take with a full glass of water on an empty stomach.  Marland Kitchen amLODipine (NORVASC) 2.5 MG tablet TAKE 1 TABLET BY MOUTH ONCE DAILY  . Calcium Carb-Cholecalciferol (CALCIUM 600 + D PO) Take 1 tablet by mouth daily.   . cholecalciferol (VITAMIN D) 1000 units tablet Take 1,000 Units by mouth daily.  . Cyanocobalamin (  B-12) 1000 MCG/ML KIT Inject 1,000 mcg as directed every 30 (thirty) days.  . famotidine (PEPCID) 20 MG tablet Take 1 tablet (20 mg total) by mouth at bedtime.  Marland Kitchen glucose blood (ACCU-CHEK AVIVA PLUS) test strip Use as  instructed, check once a day; H40.1131  . IRON PO Take 1 tablet by mouth 2 (two) times daily.   Marland Kitchen latanoprost (XALATAN) 0.005 % ophthalmic solution Place 1 drop into the right eye at bedtime.   Marland Kitchen levothyroxine (SYNTHROID) 75 MCG tablet TAKE ONE TABLET BY MOUTH ON MONDAYS, WEDNESDAYS, FRIDAYS, AND SUNDAYS.  Marland Kitchen levothyroxine (SYNTHROID) 88 MCG tablet TAKE ONE TABLET BY MOUTH ON TUESDAYS, THURSDAYS, AND SATURDAYS.  Marland Kitchen lisinopril (PRINIVIL,ZESTRIL) 5 MG tablet Take 5 mg (1 tablet) by mouth daily if Systolic CE>833. Take 2.5 mg (1/2 tablet) by mouth daily is Systolic BP> 744.  . Multiple Vitamins-Minerals (MULTIVITAMIN ADULT PO) Take by mouth.  . Omega-3 Fatty Acids (FISH OIL) 1200 MG CAPS Take by mouth daily.  . rivaroxaban (XARELTO) 20 MG TABS tablet Take 1 tablet (20 mg total) by mouth daily with supper.  . timolol (TIMOPTIC) 0.5 % ophthalmic solution Place 1 drop into the right eye 2 (two) times daily.     Allergies: Other, Alphagan [brimonidine], Pravachol [pravastatin sodium], and Pravastatin  Social History   Tobacco Use  . Smoking status: Never Smoker  . Smokeless tobacco: Never Used  Substance Use Topics  . Alcohol use: No  . Drug use: No    Family History  Problem Relation Age of Onset  . Heart attack Mother   . Glaucoma Mother   . Heart attack Father   . Parkinson's disease Brother   . Heart attack Brother     Review of Systems: A 12-system review of systems was performed and was negative except as noted in the HPI.  --------------------------------------------------------------------------------------------------  Physical Exam: BP (!) 158/50 (BP Location: Left Arm, Patient Position: Sitting, Cuff Size: Normal)   Pulse (!) 49   Ht '5\' 2"'  (1.575 m)   Wt 136 lb (61.7 kg)   LMP  (LMP Unknown)   BMI 24.87 kg/m   General: NAD.  Accompanied by her daughter. HEENT: No conjunctival pallor or scleral icterus.  Facemask in place. Neck: Supple without lymphadenopathy,  thyromegaly, JVD, or HJR. No carotid bruit. Lungs: Normal work of breathing. Clear to auscultation bilaterally without wheezes or crackles. Heart: Bradycardic but regular without murmurs, rubs, or gallops. Non-displaced PMI. Abd: Bowel sounds present. Soft, NT/ND without hepatosplenomegaly Ext: Trace left lower extremity edema. Skin: Warm and dry without rash.  EKG: Sinus bradycardia (heart rate 49 bpm) with first-degree AV block.  Poor R wave progression which may reflect lead placement.  Heart rate is slightly lower compared with prior tracing from 08/07/2018.  Otherwise, there is been no significant change.  Lab Results  Component Value Date   WBC 8.0 01/06/2019   HGB 11.0 (L) 01/06/2019   HCT 32.6 (L) 01/06/2019   MCV 95.3 01/06/2019   PLT 162 01/06/2019    Lab Results  Component Value Date   NA 134 (L) 01/27/2019   K 5.3 01/27/2019   CL 101 01/27/2019   CO2 25 01/27/2019   BUN 14 01/27/2019   CREATININE 0.81 01/27/2019   GLUCOSE 108 (H) 01/27/2019   ALT 18 01/10/2019    Lab Results  Component Value Date   CHOL 174 07/02/2018   HDL 57 07/02/2018   LDLCALC 90 07/02/2018   TRIG 173 (H) 07/02/2018   CHOLHDL 3.1  07/02/2018    --------------------------------------------------------------------------------------------------  ASSESSMENT AND PLAN: Paroxysmal atrial fibrillation: Ms. Duan remains in sinus rhythm, albeit somewhat slower than at prior visits.  She has not had any symptoms to suggest recurrence of atrial fibrillation.  She is tolerating anticoagulation with rivaroxaban well.  She has not had any further falls.  We will continue current medication regimen, occluding rivaroxaban 20 mg daily.  She is not on any AV nodal blocking agents and is again noted to be somewhat bradycardic today.  She remains on timolol eyedrops; I encouraged her to speak with her ophthalmologist about potentially switching to another agent, as ophthalmic timolol can also cause bradycardia.   Chronic HFpEF: Other than chronic trace lower extremity edema, Ms. Shimada appears euvolemic and well compensated.  Her largest limiting factor is left hip pain.  I do not recommend any medication changes at this time.  Hypertension: Blood pressure generally well controlled at home, though some elevated readings prompting use of as needed lisinopril.  Given somewhat labile blood pressure in the past, will continue with this as needed regimen of lisinopril as well as standing amlodipine.  Hyperlipidemia: LDL reasonably well controlled.  Continue atorvastatin 20 mg daily with ongoing management per PCP.  Left hip pain: Most consistent with musculoskeletal etiology.  No improvement with statin holiday after last visit.  I recommended that Ms. Brizuela speak with her PCP about further evaluation, including possible referral to orthopedics.  Follow-up: Return to clinic in 6 months.  Nelva Bush, MD 02/06/2019 8:21 AM

## 2019-02-25 ENCOUNTER — Emergency Department: Payer: Medicare Other

## 2019-02-25 ENCOUNTER — Observation Stay
Admission: EM | Admit: 2019-02-25 | Discharge: 2019-02-26 | Disposition: A | Discharge status: Home / Self Care | Payer: Medicare Other | Attending: Internal Medicine | Admitting: Internal Medicine

## 2019-02-25 ENCOUNTER — Other Ambulatory Visit: Payer: Self-pay

## 2019-02-25 DIAGNOSIS — Z79899 Other long term (current) drug therapy: Secondary | ICD-10-CM | POA: Insufficient documentation

## 2019-02-25 DIAGNOSIS — Z7901 Long term (current) use of anticoagulants: Secondary | ICD-10-CM | POA: Insufficient documentation

## 2019-02-25 DIAGNOSIS — Z79890 Hormone replacement therapy (postmenopausal): Secondary | ICD-10-CM | POA: Insufficient documentation

## 2019-02-25 DIAGNOSIS — R42 Dizziness and giddiness: Secondary | ICD-10-CM

## 2019-02-25 DIAGNOSIS — H409 Unspecified glaucoma: Secondary | ICD-10-CM | POA: Insufficient documentation

## 2019-02-25 DIAGNOSIS — G9341 Metabolic encephalopathy: Secondary | ICD-10-CM

## 2019-02-25 DIAGNOSIS — M810 Age-related osteoporosis without current pathological fracture: Secondary | ICD-10-CM | POA: Diagnosis not present

## 2019-02-25 DIAGNOSIS — Z20828 Contact with and (suspected) exposure to other viral communicable diseases: Secondary | ICD-10-CM | POA: Insufficient documentation

## 2019-02-25 DIAGNOSIS — Z90710 Acquired absence of both cervix and uterus: Secondary | ICD-10-CM | POA: Insufficient documentation

## 2019-02-25 DIAGNOSIS — R079 Chest pain, unspecified: Secondary | ICD-10-CM | POA: Insufficient documentation

## 2019-02-25 DIAGNOSIS — I5032 Chronic diastolic (congestive) heart failure: Secondary | ICD-10-CM | POA: Insufficient documentation

## 2019-02-25 DIAGNOSIS — I447 Left bundle-branch block, unspecified: Secondary | ICD-10-CM | POA: Diagnosis not present

## 2019-02-25 DIAGNOSIS — E785 Hyperlipidemia, unspecified: Secondary | ICD-10-CM | POA: Diagnosis not present

## 2019-02-25 DIAGNOSIS — I440 Atrioventricular block, first degree: Secondary | ICD-10-CM | POA: Diagnosis not present

## 2019-02-25 DIAGNOSIS — Z8249 Family history of ischemic heart disease and other diseases of the circulatory system: Secondary | ICD-10-CM | POA: Insufficient documentation

## 2019-02-25 DIAGNOSIS — I081 Rheumatic disorders of both mitral and tricuspid valves: Secondary | ICD-10-CM | POA: Diagnosis not present

## 2019-02-25 DIAGNOSIS — R002 Palpitations: Secondary | ICD-10-CM | POA: Diagnosis not present

## 2019-02-25 DIAGNOSIS — I110 Hypertensive heart disease with heart failure: Secondary | ICD-10-CM | POA: Diagnosis not present

## 2019-02-25 DIAGNOSIS — E039 Hypothyroidism, unspecified: Secondary | ICD-10-CM | POA: Insufficient documentation

## 2019-02-25 DIAGNOSIS — Z83511 Family history of glaucoma: Secondary | ICD-10-CM | POA: Insufficient documentation

## 2019-02-25 DIAGNOSIS — I10 Essential (primary) hypertension: Secondary | ICD-10-CM | POA: Diagnosis not present

## 2019-02-25 DIAGNOSIS — R0789 Other chest pain: Secondary | ICD-10-CM | POA: Diagnosis not present

## 2019-02-25 DIAGNOSIS — Z888 Allergy status to other drugs, medicaments and biological substances status: Secondary | ICD-10-CM | POA: Diagnosis not present

## 2019-02-25 DIAGNOSIS — R55 Syncope and collapse: Principal | ICD-10-CM | POA: Insufficient documentation

## 2019-02-25 DIAGNOSIS — I480 Paroxysmal atrial fibrillation: Secondary | ICD-10-CM | POA: Insufficient documentation

## 2019-02-25 LAB — CBC
HCT: 31.1 % — ABNORMAL LOW (ref 36.0–46.0)
Hemoglobin: 10.7 g/dL — ABNORMAL LOW (ref 12.0–15.0)
MCH: 32 pg (ref 26.0–34.0)
MCHC: 34.4 g/dL (ref 30.0–36.0)
MCV: 93.1 fL (ref 80.0–100.0)
Platelets: 157 10*3/uL (ref 150–400)
RBC: 3.34 MIL/uL — ABNORMAL LOW (ref 3.87–5.11)
RDW: 13.3 % (ref 11.5–15.5)
WBC: 7.1 10*3/uL (ref 4.0–10.5)
nRBC: 0 % (ref 0.0–0.2)

## 2019-02-25 LAB — SARS CORONAVIRUS 2 BY RT PCR (HOSPITAL ORDER, PERFORMED IN ~~LOC~~ HOSPITAL LAB): SARS Coronavirus 2: NEGATIVE

## 2019-02-25 LAB — BASIC METABOLIC PANEL
Anion gap: 10 (ref 5–15)
BUN: 15 mg/dL (ref 8–23)
CO2: 22 mmol/L (ref 22–32)
Calcium: 8.8 mg/dL — ABNORMAL LOW (ref 8.9–10.3)
Chloride: 97 mmol/L — ABNORMAL LOW (ref 98–111)
Creatinine, Ser: 0.69 mg/dL (ref 0.44–1.00)
GFR calc Af Amer: >60 mL/min (ref >60)
GFR calc non Af Amer: >60 mL/min (ref >60)
Glucose, Bld: 103 mg/dL — ABNORMAL HIGH (ref 70–99)
Potassium: 4 mmol/L (ref 3.5–5.1)
Sodium: 129 mmol/L — ABNORMAL LOW (ref 135–145)

## 2019-02-25 LAB — TSH: TSH: 3.349 u[IU]/mL (ref 0.350–4.500)

## 2019-02-25 LAB — TROPONIN I (HIGH SENSITIVITY)
Troponin I (High Sensitivity): 6 ng/L (ref <18)
Troponin I (High Sensitivity): 9 ng/L (ref <18)
Troponin I (High Sensitivity): 11 ng/L (ref <18)

## 2019-02-25 MED ORDER — AMLODIPINE BESYLATE 5 MG PO TABS
2.5000 mg | ORAL_TABLET | Freq: Every day | ORAL | Status: DC
  Administered 2019-02-26: 2.5 mg via ORAL
  Filled 2019-02-25: qty 1

## 2019-02-25 MED ORDER — LATANOPROST 0.005 % OP SOLN
1.0000 [drp] | Freq: Every day | OPHTHALMIC | Status: DC
  Administered 2019-02-25: 1 [drp] via OPHTHALMIC
  Filled 2019-02-25: qty 2.5

## 2019-02-25 MED ORDER — HYDRALAZINE HCL 20 MG/ML IJ SOLN: 10.0000 mg | Freq: Four times a day (QID) | INTRAMUSCULAR | Status: DC | PRN

## 2019-02-25 MED ORDER — ASPIRIN 81 MG PO CHEW
324.0000 mg | CHEWABLE_TABLET | Freq: Once | ORAL | Status: AC
  Administered 2019-02-25: 324 mg via ORAL
  Filled 2019-02-25: qty 4

## 2019-02-25 MED ORDER — TIMOLOL MALEATE 0.5 % OP SOLN
1.0000 [drp] | Freq: Two times a day (BID) | OPHTHALMIC | Status: DC
  Administered 2019-02-26: 1 [drp] via OPHTHALMIC
  Filled 2019-02-25: qty 5

## 2019-02-25 MED ORDER — SODIUM CHLORIDE 0.9 % IV SOLN
INTRAVENOUS | Status: DC
  Administered 2019-02-25: 22:00:00 via INTRAVENOUS

## 2019-02-25 MED ORDER — DOCUSATE SODIUM 100 MG PO CAPS: 100.0000 mg | ORAL_CAPSULE | Freq: Two times a day (BID) | ORAL | Status: DC | PRN

## 2019-02-25 MED ORDER — SODIUM CHLORIDE 0.9% FLUSH
10.0000 mL | Freq: Two times a day (BID) | INTRAVENOUS | Status: DC
  Administered 2019-02-25 – 2019-02-26 (×2): 10 mL via INTRAVENOUS

## 2019-02-25 MED ORDER — ATORVASTATIN CALCIUM 20 MG PO TABS: 20.0000 mg | ORAL_TABLET | Freq: Every day | ORAL | Status: DC

## 2019-02-25 MED ORDER — FAMOTIDINE 20 MG PO TABS
20.0000 mg | ORAL_TABLET | Freq: Every day | ORAL | Status: DC
  Administered 2019-02-25: 20 mg via ORAL
  Filled 2019-02-25: qty 1

## 2019-02-25 MED ORDER — LISINOPRIL 10 MG PO TABS
10.0000 mg | ORAL_TABLET | Freq: Every day | ORAL | Status: DC
  Filled 2019-02-25: qty 1

## 2019-02-25 MED ORDER — VITAMIN D 25 MCG (1000 UNIT) PO TABS
1000.0000 [IU] | ORAL_TABLET | Freq: Every day | ORAL | Status: DC
  Administered 2019-02-26: 1000 [IU] via ORAL
  Filled 2019-02-25: qty 1

## 2019-02-25 MED ORDER — RIVAROXABAN 20 MG PO TABS: 20.0000 mg | ORAL_TABLET | Freq: Every day | ORAL | Status: DC

## 2019-02-25 MED ORDER — LEVOTHYROXINE SODIUM 50 MCG PO TABS
75.0000 ug | ORAL_TABLET | ORAL | Status: DC
  Administered 2019-02-26: 75 ug via ORAL
  Filled 2019-02-25: qty 1

## 2019-02-25 NOTE — ED Triage Notes (Addendum)
Pt arrives [facility] from home for mid CP that started today. Also has HTN. Per PCP pt is to take BP each morning and adjust meds based on what her BP is. FD reports 202/70. Normally 120/50's. Denies having CP upon arrival. A&O, denies SOB. EMS reports pt was pale and diaphoretic. EMS reports pt color is WNL upon arrival.   140/85, 181/62 with EMS.   Arrives with 18 L FA. CBG 142. LBBB with EMS.   Pt states she takes xarelto, states "I can't take aspirin." no nitro on board.

## 2019-02-25 NOTE — ED Provider Notes (Signed)
[facility] Emergency Department Provider Note  ____________________________________________  Time seen: Approximately 5:54 PM  I have reviewed the triage vital signs and the nursing notes.   HISTORY  Chief Complaint Chest Pain and Hypertension    HPI Sydney Mcdaniel is a 85 y.o. female with a history of anemia, hypertension, hyperlipidemia, paroxysmal atrial fibrillation on Eliquis who was in her usual state of health until this afternoon at about 3:00 PM when she had a sudden onset of mid chest pain described as dull, nonradiating associated with diaphoresis and pallor.  Denies shortness of breath.  She was also noted to be severely hypertensive at 200/70 at the time.  She had not taken anything but called EMS who brought her to the hospital.  On arrival patient states that she is feeling better and chest pain-free currently.  No aggravating or alleviating factors.     Past Medical History:  Diagnosis Date  . Allergy   . Anemia   . Cataract   . CLL (chronic lymphocytic leukemia) ([place])   . Glaucoma   . H/O: hysterectomy    Total  . Hyperlipidemia   . Hypertension   . Hypothyroidism   . Impaired fasting glucose   . Lichen sclerosus   . Osteoporosis    Hips  . PAF (paroxysmal atrial fibrillation) ([place])    a. 06/15/2016 Event monitor: 4% afib burden; b. CHA2DS2VASc - 4-->Xarelto.  . Syncope    a. 03/2016 Echo: EF55-60%, no rwma, mild AI/MR, nl PASP; b. 03/2016 48h Holter: no significant arrhythmias/pauses; c. 03/2016 MV: mild apical defect, likely breast attenuation, nl EF, low risk; d. 05/2016 Event monitor: No significant arrhythmia; e. 05/2016 Event monitor: PAF (4%).     Patient Active Problem List   Diagnosis Date Noted  . Osteoporosis 08/21/2018  . Osteoarthritis of left hip 05/21/2018  . Bilateral carotid artery stenosis 04/19/2018  . History of syncope 10/17/2017  . MCI (mild cognitive impairment) 08/12/2017  . Lung nodules 07/09/2017   . PAF (paroxysmal atrial fibrillation) ([place]) 04/18/2017  . Pleural effusion 10/20/2016  . Hyponatremia 10/20/2016  . Chronic diastolic congestive heart failure ([place]) 06/28/2016  . Primary open-angle glaucoma, bilateral, mild stage 05/08/2016  . Labile blood glucose 04/27/2016  . Bladder prolapse, female, acquired 02/25/2016  . Urinary tract infection 02/25/2016  . 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  . B12 deficiency 01/11/2016  . Anemia 01/10/2016  . CLL (chronic lymphocytic leukemia) ([place]) 01/10/2016  . Hyperkalemia 12/22/2015  . 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 12/21/2015  . Bradycardia 12/21/2015  . Lymphocytosis 06/25/2015  . Impaired fasting glucose 06/22/2015  . Carotid atherosclerosis 06/22/2015  . Essential hypertension 12/22/2014  . Hypercholesteremia 12/22/2014  . Hypothyroidism 12/22/2014     Past Surgical History:  Procedure Laterality Date  . APPENDECTOMY    . EYE SURGERY     Glaucoma  . TOTAL ABDOMINAL HYSTERECTOMY       Prior to Admission medications   Medication Sig Start Date End Date Taking? Authorizing Provider  ACCU-CHEK SOFTCLIX LANCETS lancets Use as instructed; check once a day; H40.1131 05/08/16   Lada, Satira Anis, MD  alendronate (FOSAMAX) 70 MG tablet Take 1 tablet (70 mg total) by mouth every 7 (seven) days. Take with a full glass of water on an empty stomach. 08/22/18   Arnetha Courser, MD  amLODipine (NORVASC) 2.5 MG tablet TAKE 1 TABLET BY MOUTH ONCE DAILY 06/27/18   End,  Harrell Gave, MD  atorvastatin (LIPITOR) 20 MG tablet Take 1 tablet (20 mg total) by mouth daily. 09/05/18 01/06/19  End, Harrell Gave, MD  Calcium Carb-Cholecalciferol (CALCIUM 600 + D PO) Take 1 tablet by mouth daily.     [provider]  cholecalciferol (VITAMIN D) 1000 units tablet Take 1,000 Units by mouth daily.    [provider]  Cyanocobalamin (B-12) 1000 MCG/ML KIT Inject 1,000 mcg as  directed every 30 (thirty) days.    [provider]  famotidine (PEPCID) 20 MG tablet Take 1 tablet (20 mg total) by mouth at bedtime. 08/22/18   Arnetha Courser, MD  glucose blood (ACCU-CHEK AVIVA PLUS) test strip Use as instructed, check once a day; H40.1131 05/08/16   Lada, Satira Anis, MD  IRON PO Take 1 tablet by mouth 2 (two) times daily.     [provider]  latanoprost (XALATAN) 0.005 % ophthalmic solution Place 1 drop into the right eye at bedtime.  04/24/15   [provider]  levothyroxine (SYNTHROID) 75 MCG tablet TAKE ONE TABLET BY MOUTH ON MONDAYS, WEDNESDAYS, FRIDAYS, AND SUNDAYS. 10/30/18   Hubbard Hartshorn, FNP  levothyroxine (SYNTHROID) 88 MCG tablet TAKE ONE TABLET BY MOUTH ON TUESDAYS, THURSDAYS, AND SATURDAYS. 10/30/18   Hubbard Hartshorn, FNP  lisinopril (PRINIVIL,ZESTRIL) 5 MG tablet Take 5 mg (1 tablet) by mouth daily if Systolic KD>326. Take 2.5 mg (1/2 tablet) by mouth daily is Systolic BP> 712. 4/58/09   End, Harrell Gave, MD  Multiple Vitamins-Minerals (MULTIVITAMIN ADULT PO) Take by mouth.    [provider]  Omega-3 Fatty Acids (FISH OIL) 1200 MG CAPS Take by mouth daily.    [provider]  rivaroxaban (XARELTO) 20 MG TABS tablet Take 1 tablet (20 mg total) by mouth daily with supper. 07/09/18   End, Harrell Gave, MD  timolol (TIMOPTIC) 0.5 % ophthalmic solution Place 1 drop into the right eye 2 (two) times daily.  04/24/15   [provider]     Allergies Other, Alphagan [brimonidine], Pravachol [pravastatin sodium], and Pravastatin   Family History  Problem Relation Age of Onset  . Heart attack Mother   . Glaucoma Mother   . Heart attack Father   . Parkinson's disease Brother   . Heart attack Brother     Social History Social History   Tobacco Use  . Smoking status: Never Smoker  . Smokeless tobacco: Never Used  Substance Use Topics  . Alcohol use: No  . Drug use: No    Review of Systems  Constitutional:    No fever or chills.  ENT:   No sore throat. No rhinorrhea. Cardiovascular: Positive chest pain as above without syncope. Respiratory:   No dyspnea or cough. Gastrointestinal:   Negative for abdominal pain, vomiting and diarrhea.  Musculoskeletal:   Negative for focal pain or swelling All other systems reviewed and are negative except as documented above in ROS and HPI.  ____________________________________________   PHYSICAL EXAM:  VITAL SIGNS: ED Triage Vitals  Enc Vitals Group     BP 02/25/19 1610 (!) 170/91     Pulse Rate 02/25/19 1610 69     Resp 02/25/19 1610 16     Temp 02/25/19 1614 98.2 F (36.8 C)     Temp Source 02/25/19 1614 Oral     SpO2 02/25/19 1610 98 %     Weight 02/25/19 1610 137 lb (62.1 kg)     Height 02/25/19 1610 '5\' 2"'  (1.575 m)     Head Circumference --  Peak Flow --      Pain Score 02/25/19 1610 0     Pain Loc --      Pain Edu? --      Excl. in [place]? --     Vital signs reviewed, nursing assessments reviewed.   Constitutional:   Alert and oriented. Non-toxic appearance. Eyes:   Conjunctivae are normal. EOMI. PERRL. ENT      Head:   Normocephalic and atraumatic.      Nose:   No congestion/rhinnorhea.       Mouth/Throat:   MMM, no pharyngeal erythema. No peritonsillar mass.       Neck:   No meningismus. Full ROM. Hematological/Lymphatic/Immunilogical:   No cervical lymphadenopathy. Cardiovascular:   RRR. Symmetric bilateral radial and DP pulses.  No murmurs. Cap refill less than 2 seconds. Respiratory:   Normal respiratory effort without tachypnea/retractions. Breath sounds are clear and equal bilaterally. No wheezes/rales/rhonchi. Gastrointestinal:   Soft and nontender. Non distended. There is no CVA tenderness.  No rebound, rigidity, or guarding. Musculoskeletal:   Normal range of motion in all extremities. No joint effusions.  No lower extremity tenderness.  No edema. Neurologic:   Normal speech and language.  Motor grossly intact. No acute  focal neurologic deficits are appreciated.  Skin:    Skin is warm, dry and intact. No rash noted.  No petechiae, purpura, or bullae.  ____________________________________________    LABS (pertinent positives/negatives) (all labs ordered are listed, but only abnormal results are displayed) Labs Reviewed  BASIC METABOLIC PANEL - Abnormal; Notable for the following components:      Result Value   Sodium 129 (*)    Chloride 97 (*)    Glucose, Bld 103 (*)    Calcium 8.8 (*)    All other components within normal limits  CBC - Abnormal; Notable for the following components:   RBC 3.34 (*)    Hemoglobin 10.7 (*)    HCT 31.1 (*)    All other components within normal limits  SARS CORONAVIRUS 2 (HOSPITAL ORDER, [facility])  TROPONIN I (HIGH SENSITIVITY)  TROPONIN I (HIGH SENSITIVITY)   ____________________________________________   EKG  Interpreted by me Sinus rhythm rate of 68, right axis, normal intervals.  Poor R wave progression.  Left bundle branch block.  No acute ischemic changes.  ____________________________________________    [id number]  Dg Chest Portable 1 View  Result Date: 02/25/2019 CLINICAL DATA:  Central chest pain EXAM: PORTABLE CHEST 1 VIEW COMPARISON:  10/23/2016 FINDINGS: Tiny left pleural effusion or thickening. Borderline cardiomegaly. No consolidation. Aortic atherosclerosis. No pneumothorax. IMPRESSION: Tiny left pleural effusion or thickening. Otherwise no acute interval change. Electronically Signed   By: Donavan Foil M.D.   On: 02/25/2019 16:46    ____________________________________________   PROCEDURES Procedures  ____________________________________________  DIFFERENTIAL DIAGNOSIS   Paroxysmal atrial fibrillation, other dysrhythmia, non-STEMI  CLINICAL IMPRESSION / ASSESSMENT AND PLAN / ED COURSE  Medications ordered in the ED: Medications - No data to display  Pertinent labs & imaging results that were available  during my care of the patient were reviewed by me and considered in my medical decision making (see chart for details).  Sydney Mcdaniel was evaluated in Emergency Department on 02/25/2019 for the symptoms described in the history of present illness. She was evaluated in the context of the global COVID-19 pandemic, which necessitated consideration that the patient might be at risk for infection with the SARS-CoV-2 virus that causes COVID-19. Institutional protocols and algorithms  that pertain to the evaluation of patients at risk for COVID-19 are in a state of rapid change based on information released by regulatory bodies including the CDC and federal and state organizations. These policies and algorithms were followed during the patient's care in the ED.   Patient presents with an episode of concerning chest pain, most likely paroxysmal dysrhythmia.  Currently hemodynamically stable although hypertensive.  Doubt dissection, carditis, pneumothorax, intracranial hemorrhage.  Recommend hospitalization for telemetry monitoring and cardiology evaluation which patient agrees.  Discussed with hospitalist.      ____________________________________________   FINAL CLINICAL IMPRESSION(S) / ED DIAGNOSES    Final diagnoses:  Chest pain with moderate risk for cardiac etiology  Near syncope     ED Discharge Orders    None      Portions of this note were generated with dragon dictation software. Dictation errors may occur despite best attempts at proofreading.   Carrie Mew, MD 02/25/19 [id number]

## 2019-02-25 NOTE — ED Notes (Signed)
Pt ambulated to toilet to urinate.

## 2019-02-25 NOTE — Progress Notes (Signed)
Family Meeting Note  Advance Directive:yes  Today a meeting took place with the Patient. And her daughter.   The following clinical team members were present during this meeting:MD  The following were discussed:Patient's diagnosis: Palpitation, dizziness, presyncopal episode, Patient's progosis: Unable to determine and Goals for treatment: Full Code  Additional follow-up to be provided: PMD.Cardiology  Time spent during discussion:20 minutes  Vaughan Basta, MD

## 2019-02-25 NOTE — ED Notes (Signed)
EDP at bedside. Daughter at bedside  

## 2019-02-25 NOTE — ED Notes (Signed)
X-ray at bedside

## 2019-02-25 NOTE — ED Notes (Signed)
Pt ambulatory to toilet with this RN. Urinated into toilet and ambulated back to bed.

## 2019-02-25 NOTE — ED Notes (Addendum)
ED TO INPATIENT HANDOFF REPORT  ED Nurse Name and Phone #:   Gershon Mussel RN   [phone] S Name/Age/Gender Sydney Mcdaniel 85 y.o. female Room/Bed: ED24A/ED24A  Code Status   Code Status: Prior  Home/SNF/Other Home Patient oriented to: self, place, time and situation Is this baseline? Yes   Triage Complete: Triage complete  Chief Complaint Chest Pain  Triage Note Pt arrives [facility] from home for mid CP that started today. Also has HTN. Per PCP pt is to take BP each morning and adjust meds based on what her BP is. FD reports 202/70. Normally 120/50's. Denies having CP upon arrival. A&O, denies SOB. EMS reports pt was pale and diaphoretic. EMS reports pt color is WNL upon arrival.   140/85, 181/62 with EMS.   Arrives with 18 L FA. CBG 142. LBBB with EMS.   Pt states she takes xarelto, states "I can't take aspirin." no nitro on board.    Allergies Allergies  Allergen Reactions  . Other   . Alphagan [Brimonidine] Itching and Rash  . Pravachol [Pravastatin Sodium] Itching and Rash  . Pravastatin Itching, Other (See Comments) and Rash    Level of Care/Admitting Diagnosis ED Disposition    ED Disposition Condition Comment   Admit  Hospital Area: [place] [[id number]]  Level of Care: Telemetry [5]  Covid Evaluation: Asymptomatic Screening Protocol (No Symptoms)  Diagnosis: Postural dizziness with presyncope [id number]  Admitting Physician: Vaughan Basta [id number]  Attending Physician: Vaughan Basta [phone]  PT Class (Do Not Modify): Observation [104]  PT Acc Code (Do Not Modify): Observation [10022]       B Medical/Surgery History Past Medical History:  Diagnosis Date  . Allergy   . Anemia   . Cataract   . CLL (chronic lymphocytic leukemia) ([place])   . Glaucoma   . H/O: hysterectomy    Total  . Hyperlipidemia   . Hypertension   . Hypothyroidism   . Impaired fasting glucose   . Lichen sclerosus   . Osteoporosis    Hips  . PAF  (paroxysmal atrial fibrillation) ([place])    a. 06/15/2016 Event monitor: 4% afib burden; b. CHA2DS2VASc - 4-->Xarelto.  . Syncope    a. 03/2016 Echo: EF55-60%, no rwma, mild AI/MR, nl PASP; b. 03/2016 48h Holter: no significant arrhythmias/pauses; c. 03/2016 MV: mild apical defect, likely breast attenuation, nl EF, low risk; d. 05/2016 Event monitor: No significant arrhythmia; e. 05/2016 Event monitor: PAF (4%).   Past Surgical History:  Procedure Laterality Date  . APPENDECTOMY    . EYE SURGERY     Glaucoma  . TOTAL ABDOMINAL HYSTERECTOMY       A IV Location/Drains/Wounds Patient Lines/Drains/Airways Status   Active Line/Drains/Airways    Name:   Placement date:   Placement time:   Site:   Days:   Peripheral IV 02/25/19 Left Forearm   02/25/19    1615    Forearm   less than 1          Intake/Output Last 24 hours No intake or output data in the 24 hours ending 02/25/19 1957  Labs/Imaging Results for orders placed or performed during the hospital encounter of 02/25/19 (from the past 48 hour(s))  Basic metabolic panel     Status: Abnormal   Collection Time: 02/25/19  4:15 PM  Result Value Ref Range   Sodium 129 (L) 135 - 145 mmol/L   Potassium 4.0 3.5 - 5.1 mmol/L   Chloride 97 (L) 98 - 111 mmol/L  CO2 22 22 - 32 mmol/L   Glucose, Bld 103 (H) 70 - 99 mg/dL   BUN 15 8 - 23 mg/dL   Creatinine, Ser 0.69 0.44 - 1.00 mg/dL   Calcium 8.8 (L) 8.9 - 10.3 mg/dL   GFR calc non Af Amer >60 >60 mL/min   GFR calc Af Amer >60 >60 mL/min   Anion gap 10 5 - 15    Comment: Performed at [facility], [address] [place], [place] [address]  CBC     Status: Abnormal   Collection Time: 02/25/19  4:15 PM  Result Value Ref Range   WBC 7.1 4.0 - 10.5 K/uL   RBC 3.34 (L) 3.87 - 5.11 MIL/uL   Hemoglobin 10.7 (L) 12.0 - 15.0 g/dL   HCT 31.1 (L) 36.0 - 46.0 %   MCV 93.1 80.0 - 100.0 fL   MCH 32.0 26.0 - 34.0 pg   MCHC 34.4 30.0 - 36.0 g/dL   RDW 13.3 11.5 - 15.5 %   Platelets  157 150 - 400 K/uL   nRBC 0.0 0.0 - 0.2 %    Comment: Performed at [facility], [place], [place] [address]  Troponin I (High Sensitivity)     Status: None   Collection Time: 02/25/19  4:15 PM  Result Value Ref Range   Troponin I (High Sensitivity) 6 <18 ng/L    Comment: (NOTE) Elevated high sensitivity troponin I (hsTnI) values and significant  changes across serial measurements may suggest ACS but many other  chronic and acute conditions are known to elevate hsTnI results.  Refer to the "Links" section for chest pain algorithms and additional  guidance. Performed at [facility], [address] [place],  [address]   SARS Coronavirus 2 [facility] order, Performed in [facility] hospital lab) Nasopharyngeal Nasopharyngeal Swab     Status: None   Collection Time: 02/25/19  5:51 PM   Specimen: Nasopharyngeal Swab  Result Value Ref Range   SARS Coronavirus 2 NEGATIVE NEGATIVE    Comment: (NOTE) If result is NEGATIVE SARS-CoV-2 target nucleic acids are NOT DETECTED. The SARS-CoV-2 RNA is generally detectable in upper and lower  respiratory specimens during the acute phase of infection. The lowest  concentration of SARS-CoV-2 viral copies this assay can detect is 250  copies / mL. A negative result does not preclude SARS-CoV-2 infection  and should not be used as the sole basis for treatment or other  patient management decisions.  A negative result may occur with  improper specimen collection / handling, submission of specimen other  than nasopharyngeal swab, presence of viral mutation(s) within the  areas targeted by this assay, and inadequate number of viral copies  (<250 copies / mL). A negative result must be combined with clinical  observations, patient history, and epidemiological information. If result is POSITIVE SARS-CoV-2 target nucleic acids are DETECTED. The SARS-CoV-2 RNA is generally detectable in upper and lower  respiratory  specimens dur ing the acute phase of infection.  Positive  results are indicative of active infection with SARS-CoV-2.  Clinical  correlation with patient history and other diagnostic information is  necessary to determine patient infection status.  Positive results do  not rule out bacterial infection or co-infection with other viruses. If result is PRESUMPTIVE POSTIVE SARS-CoV-2 nucleic acids MAY BE PRESENT.   A presumptive positive result was obtained on the submitted specimen  and confirmed on repeat testing.  While 2019 novel coronavirus  (SARS-CoV-2) nucleic acids  may be present in the submitted sample  additional confirmatory testing may be necessary for epidemiological  and / or clinical management purposes  to differentiate between  SARS-CoV-2 and other Sarbecovirus currently known to infect humans.  If clinically indicated additional testing with an alternate test  methodology [phone]) is advised. The SARS-CoV-2 RNA is generally  detectable in upper and lower respiratory sp ecimens during the acute  phase of infection. The expected result is Negative. Fact Sheet for Patients:  StrictlyIdeas.no Fact Sheet for Healthcare Providers: BankingDealers.co.za This test is not yet approved or cleared by the [place] FDA and has been authorized for detection and/or diagnosis of SARS-CoV-2 by FDA under an Emergency Use Authorization (EUA).  This EUA will remain in effect (meaning this test can be used) for the duration of the COVID-19 declaration under Section 564(b)(1) of the Act, 21 U.S.C. section 360bbb-3(b)(1), unless the authorization is terminated or revoked sooner. Performed at [facility], [place], [place] [address]   Troponin I (High Sensitivity)     Status: None   Collection Time: 02/25/19  6:17 PM  Result Value Ref Range   Troponin I (High Sensitivity) 9 <18 ng/L    Comment: (NOTE) Elevated  high sensitivity troponin I (hsTnI) values and significant  changes across serial measurements may suggest ACS but many other  chronic and acute conditions are known to elevate hsTnI results.  Refer to the "Links" section for chest pain algorithms and additional  guidance. Performed at [facility], [address] [place], [place] [address]    Dg Chest Portable 1 View  Result Date: 02/25/2019 CLINICAL DATA:  Central chest pain EXAM: PORTABLE CHEST 1 VIEW COMPARISON:  10/23/2016 FINDINGS: Tiny left pleural effusion or thickening. Borderline cardiomegaly. No consolidation. Aortic atherosclerosis. No pneumothorax. IMPRESSION: Tiny left pleural effusion or thickening. Otherwise no acute interval change. Electronically Signed   By: Donavan Foil M.D.   On: 02/25/2019 16:46    Pending Labs Unresulted Labs (From admission, onward)    Start     Ordered   02/25/19 1835  TSH  Add-on,   AD     02/25/19 1834   Signed and Held  Basic metabolic panel  Tomorrow morning,   R     Signed and Held   Signed and Held  CBC  Tomorrow morning,   R     Signed and Held          Vitals/Pain Today's Vitals   02/25/19 1614 02/25/19 1630 02/25/19 1700 02/25/19 1730  BP:  (!) 178/59 (!) 172/58 (!) 170/54  Pulse:  64 67 66  Resp:  17    Temp: 98.2 F (36.8 C)     TempSrc: Oral     SpO2:  98% 98% 97%  Weight:      Height:      PainSc:        Isolation Precautions No active isolations  Medications Medications  hydrALAZINE (APRESOLINE) injection 10 mg (has no administration in time range)  0.9 %  sodium chloride infusion (has no administration in time range)  aspirin chewable tablet 324 mg (324 mg Oral Given 02/25/19 1809)    Mobility walks with device Low fall risk   Focused Assessments Cardiac Assessment Handoff:  Cardiac Rhythm: Normal sinus rhythm Lab Results  Component Value Date   TROPONINI <0.03 05/25/2016   No results found for: DDIMER Does the Patient currently have  chest pain? No     R Recommendations: See  Admitting Provider Note  Report given to: Marcelle RN on 2C  Additional Notes:

## 2019-02-25 NOTE — H&P (Signed)
[facility] [place] [facility] NAME: Sydney Mcdaniel    MR#:  [id number]  DATE OF BIRTH:  [birth year]  DATE OF ADMISSION:  02/25/2019  PRIMARY CARE PHYSICIAN: Arnetha Courser, MD   REQUESTING/REFERRING PHYSICIAN: stafford  CHIEF COMPLAINT:   Chief Complaint  Patient presents with  . Chest Pain  . Hypertension    HISTORY OF PRESENT ILLNESS: Sydney Mcdaniel  is a 85 y.o. female with a known history of anemia, CLL, hyperlipidemia, hypertension, hypothyroidism, osteoporosis, paroxysmal atrial fibrillation, syncope-noted her blood pressure was running low this morning so she did not take her blood pressure medicines. Later she felt some chest tightness and palpitation and she called her daughter who lives across the street.  Daughter went home and she noted patient was cold clammy and did not look too good.  She called EMS and they noted her blood pressure was more than 748 systolic and heart rate was running in 80s or 90s(normally her heart rate runs in 50s).  She asked her to go to emergency room and patient agreed. On arrival to ER her blood pressure was slightly on the high side but she was in normal sinus rhythm.  As patient has a significant history of A. fib and CHF, ER physician suggested to monitor and get cardiologist consult.   PAST MEDICAL HISTORY:   Past Medical History:  Diagnosis Date  . Allergy   . Anemia   . Cataract   . CLL (chronic lymphocytic leukemia) ([place])   . Glaucoma   . H/O: hysterectomy    Total  . Hyperlipidemia   . Hypertension   . Hypothyroidism   . Impaired fasting glucose   . Lichen sclerosus   . Osteoporosis    Hips  . PAF (paroxysmal atrial fibrillation) ([place])    a. 06/15/2016 Event monitor: 4% afib burden; b. CHA2DS2VASc - 4-->Xarelto.  . Syncope    a. 03/2016 Echo: EF55-60%, no rwma, mild AI/MR, nl PASP; b. 03/2016 48h Holter: no significant arrhythmias/pauses; c. 03/2016 MV: mild apical defect, likely breast  attenuation, nl EF, low risk; d. 05/2016 Event monitor: No significant arrhythmia; e. 05/2016 Event monitor: PAF (4%).    PAST SURGICAL HISTORY:  Past Surgical History:  Procedure Laterality Date  . APPENDECTOMY    . EYE SURGERY     Glaucoma  . TOTAL ABDOMINAL HYSTERECTOMY      SOCIAL HISTORY:  Social History   Tobacco Use  . Smoking status: Never Smoker  . Smokeless tobacco: Never Used  Substance Use Topics  . Alcohol use: No    FAMILY HISTORY:  Family History  Problem Relation Age of Onset  . Heart attack Mother   . Glaucoma Mother   . Heart attack Father   . Parkinson's disease Brother   . Heart attack Brother     DRUG ALLERGIES:  Allergies  Allergen Reactions  . Other   . Alphagan [Brimonidine] Itching and Rash  . Pravachol [Pravastatin Sodium] Itching and Rash  . Pravastatin Itching, Other (See Comments) and Rash    REVIEW OF SYSTEMS:   CONSTITUTIONAL: No fever, fatigue or weakness.  EYES: No blurred or double vision.  EARS, NOSE, AND THROAT: No tinnitus or ear pain.  RESPIRATORY: No cough, shortness of breath, wheezing or hemoptysis.  CARDIOVASCULAR: She have chest pain, no orthopnea, edema.  GASTROINTESTINAL: No nausea, vomiting, diarrhea or abdominal pain.  GENITOURINARY: No dysuria, hematuria.  ENDOCRINE: No polyuria, nocturia,  HEMATOLOGY: No anemia, easy bruising or bleeding  SKIN: No rash or lesion. MUSCULOSKELETAL: No joint pain or arthritis.   NEUROLOGIC: No tingling, numbness, weakness.  PSYCHIATRY: No anxiety or depression.   MEDICATIONS AT HOME:  Prior to Admission medications   Medication Sig Start Date End Date Taking? Authorizing Provider  ACCU-CHEK SOFTCLIX LANCETS lancets Use as instructed; check once a day; H40.1131 05/08/16   Lada, Satira Anis, MD  alendronate (FOSAMAX) 70 MG tablet Take 1 tablet (70 mg total) by mouth every 7 (seven) days. Take with a full glass of water on an empty stomach. 08/22/18   Arnetha Courser, MD  amLODipine  (NORVASC) 2.5 MG tablet TAKE 1 TABLET BY MOUTH ONCE DAILY 06/27/18   End, Harrell Gave, MD  atorvastatin (LIPITOR) 20 MG tablet Take 1 tablet (20 mg total) by mouth daily. 09/05/18 01/06/19  End, Harrell Gave, MD  Calcium Carb-Cholecalciferol (CALCIUM 600 + D PO) Take 1 tablet by mouth daily.     [provider]  cholecalciferol (VITAMIN D) 1000 units tablet Take 1,000 Units by mouth daily.    [provider]  Cyanocobalamin (B-12) 1000 MCG/ML KIT Inject 1,000 mcg as directed every 30 (thirty) days.    [provider]  famotidine (PEPCID) 20 MG tablet Take 1 tablet (20 mg total) by mouth at bedtime. 08/22/18   Arnetha Courser, MD  glucose blood (ACCU-CHEK AVIVA PLUS) test strip Use as instructed, check once a day; H40.1131 05/08/16   Lada, Satira Anis, MD  IRON PO Take 1 tablet by mouth 2 (two) times daily.     [provider]  latanoprost (XALATAN) 0.005 % ophthalmic solution Place 1 drop into the right eye at bedtime.  04/24/15   [provider]  levothyroxine (SYNTHROID) 75 MCG tablet TAKE ONE TABLET BY MOUTH ON MONDAYS, WEDNESDAYS, FRIDAYS, AND SUNDAYS. 10/30/18   Hubbard Hartshorn, FNP  levothyroxine (SYNTHROID) 88 MCG tablet TAKE ONE TABLET BY MOUTH ON TUESDAYS, THURSDAYS, AND SATURDAYS. 10/30/18   Hubbard Hartshorn, FNP  lisinopril (PRINIVIL,ZESTRIL) 5 MG tablet Take 5 mg (1 tablet) by mouth daily if Systolic XH>371. Take 2.5 mg (1/2 tablet) by mouth daily is Systolic BP> 696. 7/89/38   End, Harrell Gave, MD  Multiple Vitamins-Minerals (MULTIVITAMIN ADULT PO) Take by mouth.    [provider]  Omega-3 Fatty Acids (FISH OIL) 1200 MG CAPS Take by mouth daily.    [provider]  rivaroxaban (XARELTO) 20 MG TABS tablet Take 1 tablet (20 mg total) by mouth daily with supper. 07/09/18   End, Harrell Gave, MD  timolol (TIMOPTIC) 0.5 % ophthalmic solution Place 1 drop into the right eye 2 (two) times daily.  04/24/15   [provider]      PHYSICAL  EXAMINATION:   VITAL SIGNS: Blood pressure (!) 170/54, pulse 66, temperature 98.2 F (36.8 C), temperature source Oral, resp. rate 17, height '5\' 2"'  (1.575 m), weight 62.1 kg, SpO2 97 %.  GENERAL:  85 y.o.-year-old patient lying in the bed with no acute distress.  EYES: Pupils equal, round, reactive to light and accommodation. No scleral icterus. Extraocular muscles intact.  HEENT: Head atraumatic, normocephalic. Oropharynx and nasopharynx clear.  NECK:  Supple, no jugular venous distention. No thyroid enlargement, no tenderness.  LUNGS: Normal breath sounds bilaterally, no wheezing, rales,rhonchi or crepitation. No use of accessory muscles of respiration.  CARDIOVASCULAR: S1, S2 normal. No murmurs, rubs, or gallops.  ABDOMEN: Soft, nontender, nondistended. Bowel sounds present. No organomegaly or mass.  EXTREMITIES: No pedal edema, cyanosis, or clubbing.  NEUROLOGIC: Cranial nerves  II through XII are intact. Muscle strength 5/5 in all extremities. Sensation intact. Gait not checked.  PSYCHIATRIC: The patient is alert and oriented x 3.  SKIN: No obvious rash, lesion, or ulcer.   LABORATORY PANEL:   CBC Recent Labs  Lab 02/25/19 1615  WBC 7.1  HGB 10.7*  HCT 31.1*  PLT 157  MCV 93.1  MCH 32.0  MCHC 34.4  RDW 13.3   ------------------------------------------------------------------------------------------------------------------  Chemistries  Recent Labs  Lab 02/25/19 1615  NA 129*  K 4.0  CL 97*  CO2 22  GLUCOSE 103*  BUN 15  CREATININE 0.69  CALCIUM 8.8*   ------------------------------------------------------------------------------------------------------------------ estimated creatinine clearance is 39.7 mL/min (by C-G formula based on SCr of 0.69 mg/dL). ------------------------------------------------------------------------------------------------------------------ No results for input(s): TSH, T4TOTAL, T3FREE, THYROIDAB in the last 72 hours.  Invalid  input(s): FREET3   Coagulation profile No results for input(s): INR, PROTIME in the last 168 hours. ------------------------------------------------------------------------------------------------------------------- No results for input(s): DDIMER in the last 72 hours. -------------------------------------------------------------------------------------------------------------------  Cardiac Enzymes No results for input(s): CKMB, TROPONINI, MYOGLOBIN in the last 168 hours.  Invalid input(s): CK ------------------------------------------------------------------------------------------------------------------ Invalid input(s): POCBNP  ---------------------------------------------------------------------------------------------------------------  Urinalysis    Component Value Date/Time   COLORURINE YELLOW (A) 10/02/2016 1727   APPEARANCEUR HAZY (A) 10/02/2016 1727   LABSPEC 1.005 10/02/2016 1727   PHURINE 7.0 10/02/2016 1727   GLUCOSEU NEGATIVE 10/02/2016 1727   HGBUR NEGATIVE 10/02/2016 1727   BILIRUBINUR NEGATIVE 10/02/2016 1727   BILIRUBINUR neg 02/25/2016 1013   KETONESUR NEGATIVE 10/02/2016 1727   PROTEINUR NEGATIVE 10/02/2016 1727   UROBILINOGEN 0.2 02/25/2016 1013   NITRITE NEGATIVE 10/02/2016 1727   LEUKOCYTESUR SMALL (A) 10/02/2016 1727     RADIOLOGY: Dg Chest Portable 1 View  Result Date: 02/25/2019 CLINICAL DATA:  Central chest pain EXAM: PORTABLE CHEST 1 VIEW COMPARISON:  10/23/2016 FINDINGS: Tiny left pleural effusion or thickening. Borderline cardiomegaly. No consolidation. Aortic atherosclerosis. No pneumothorax. IMPRESSION: Tiny left pleural effusion or thickening. Otherwise no acute interval change. Electronically Signed   By: Donavan Foil M.D.   On: 02/25/2019 16:46    EKG: Orders placed or performed during the hospital encounter of 02/25/19  . EKG 12-Lead  . EKG 12-Lead  . ED EKG  . ED EKG    IMPRESSION AND PLAN:  *Palpitation and chest  tightness Feeling of dizziness and presyncopal episode  With uncontrolled hypertension, some tachycardia. There could be cardiac arrhythmia causing this event. Patient denies any focal neurological symptoms or weakness. We will monitor on telemetry, follow serial troponin, get echocardiogram, get cardiology consult I have informed cardiologist by text message.  *Uncontrolled hypertension Patient has not taken her hypertensive medication today morning. I will resume home medication and give her hydralazine injection as needed.  *Paroxysmal atrial fibrillation Rate is currently controlled in sinus rhythm. Continue amlodipine, Xarelto.  *Hypothyroidism Continue levothyroxine.  With her symptoms I would check TSH.  *Hyperlipidemia Continue atorvastatin.  All the records are reviewed and case discussed with ED provider. Management plans discussed with the patient, family and they are in agreement.  CODE STATUS: Full code Code Status History    Date Active Date Inactive Code Status Order ID Comments User Context   05/24/2016 1333 05/25/2016 2126 Full Code [id number]  Fritzi Mandes, MD Inpatient   Advance Care Planning Activity     Patient's daughter was present in the room during my visit.  TOTAL TIME TAKING CARE OF THIS PATIENT: 45 minutes.    Vaughan Basta M.D on 02/25/2019   Between 7am  to 6pm - Pager - [phone]  After 6pm go to [email or web address] - password [email or web address] [facility]  Office  [phone]  CC: Primary care physician; Arnetha Courser, MD   Note: This dictation was prepared with Dragon dictation along with smaller phrase technology. Any transcriptional errors that result from this process are unintentional.

## 2019-02-26 ENCOUNTER — Observation Stay (HOSPITAL_BASED_OUTPATIENT_CLINIC_OR_DEPARTMENT_OTHER): Payer: Medicare Other

## 2019-02-26 ENCOUNTER — Observation Stay (HOSPITAL_BASED_OUTPATIENT_CLINIC_OR_DEPARTMENT_OTHER)
Admit: 2019-02-26 | Discharge: 2019-02-26 | Disposition: A | Discharge status: Home / Self Care | Payer: Medicare Other | Attending: Internal Medicine | Admitting: Internal Medicine

## 2019-02-26 DIAGNOSIS — E039 Hypothyroidism, unspecified: Secondary | ICD-10-CM | POA: Diagnosis not present

## 2019-02-26 DIAGNOSIS — I951 Orthostatic hypotension: Secondary | ICD-10-CM

## 2019-02-26 DIAGNOSIS — I447 Left bundle-branch block, unspecified: Secondary | ICD-10-CM | POA: Diagnosis not present

## 2019-02-26 DIAGNOSIS — I10 Essential (primary) hypertension: Secondary | ICD-10-CM | POA: Diagnosis not present

## 2019-02-26 DIAGNOSIS — I340 Nonrheumatic mitral (valve) insufficiency: Secondary | ICD-10-CM

## 2019-02-26 DIAGNOSIS — R55 Syncope and collapse: Secondary | ICD-10-CM

## 2019-02-26 DIAGNOSIS — I480 Paroxysmal atrial fibrillation: Secondary | ICD-10-CM

## 2019-02-26 DIAGNOSIS — I361 Nonrheumatic tricuspid (valve) insufficiency: Secondary | ICD-10-CM | POA: Diagnosis not present

## 2019-02-26 DIAGNOSIS — R079 Chest pain, unspecified: Secondary | ICD-10-CM

## 2019-02-26 DIAGNOSIS — R0789 Other chest pain: Secondary | ICD-10-CM | POA: Diagnosis not present

## 2019-02-26 DIAGNOSIS — E785 Hyperlipidemia, unspecified: Secondary | ICD-10-CM | POA: Diagnosis not present

## 2019-02-26 LAB — NM MYOCAR MULTI W/SPECT W/WALL MOTION / EF
Estimated workload: 1 METS
Exercise duration (min): 0 min
Exercise duration (sec): 0 s
LV dias vol: 45 mL (ref 46–106)
LV sys vol: 31 mL
MPHR: 129 {beats}/min
Peak HR: 83 {beats}/min
Percent HR: 64 %
Rest HR: 64 {beats}/min
SDS: 1
SRS: 2
SSS: 5
TID: 0.81

## 2019-02-26 LAB — BASIC METABOLIC PANEL
Anion gap: 8 (ref 5–15)
BUN: 11 mg/dL (ref 8–23)
CO2: 26 mmol/L (ref 22–32)
Calcium: 8.7 mg/dL — ABNORMAL LOW (ref 8.9–10.3)
Chloride: 103 mmol/L (ref 98–111)
Creatinine, Ser: 0.59 mg/dL (ref 0.44–1.00)
GFR calc Af Amer: >60 mL/min (ref >60)
GFR calc non Af Amer: >60 mL/min (ref >60)
Glucose, Bld: 120 mg/dL — ABNORMAL HIGH (ref 70–99)
Potassium: 4.2 mmol/L (ref 3.5–5.1)
Sodium: 137 mmol/L (ref 135–145)

## 2019-02-26 LAB — CBC
HCT: 32.7 % — ABNORMAL LOW (ref 36.0–46.0)
Hemoglobin: 10.9 g/dL — ABNORMAL LOW (ref 12.0–15.0)
MCH: 31 pg (ref 26.0–34.0)
MCHC: 33.3 g/dL (ref 30.0–36.0)
MCV: 92.9 fL (ref 80.0–100.0)
Platelets: 162 10*3/uL (ref 150–400)
RBC: 3.52 MIL/uL — ABNORMAL LOW (ref 3.87–5.11)
RDW: 13.2 % (ref 11.5–15.5)
WBC: 6.1 10*3/uL (ref 4.0–10.5)
nRBC: 0 % (ref 0.0–0.2)

## 2019-02-26 LAB — ECHOCARDIOGRAM COMPLETE
Height: 62 in
Weight: 2187.2 oz

## 2019-02-26 MED ORDER — LISINOPRIL 20 MG PO TABS
20.0000 mg | ORAL_TABLET | Freq: Every day | ORAL | Status: DC
  Administered 2019-02-26: 11:00:00 20 mg via ORAL
  Filled 2019-02-26: qty 1

## 2019-02-26 MED ORDER — RIVAROXABAN 15 MG PO TABS: 15.0000 mg | ORAL_TABLET | Freq: Every day | ORAL | 0 refills | Status: DC

## 2019-02-26 MED ORDER — REGADENOSON 0.4 MG/5ML IV SOLN
0.4000 mg | Freq: Once | INTRAVENOUS | Status: AC
  Administered 2019-02-26: 0.4 mg via INTRAVENOUS

## 2019-02-26 MED ORDER — TECHNETIUM TC 99M TETROFOSMIN IV KIT
30.0000 | PACK | Freq: Once | INTRAVENOUS | Status: AC | PRN
  Administered 2019-02-26: 31.67 via INTRAVENOUS

## 2019-02-26 MED ORDER — TECHNETIUM TC 99M TETROFOSMIN IV KIT
11.1000 | PACK | Freq: Once | INTRAVENOUS | Status: AC | PRN
  Administered 2019-02-26: 11.1 via INTRAVENOUS

## 2019-02-26 NOTE — Discharge Summary (Signed)
[facility] [place] NAME: Sydney Mcdaniel    MR#:  [id number]  DATE OF BIRTH:  10/29/1924  DATE OF ADMISSION:  02/25/2019 ADMITTING PHYSICIAN: Vaughan Basta, MD  DATE OF DISCHARGE: 02/26/2019  PRIMARY CARE PHYSICIAN: Arnetha Courser, MD    ADMISSION DIAGNOSIS:  Near syncope [R55] Chest pain with moderate risk for cardiac etiology [R07.9]  DISCHARGE DIAGNOSIS:  Principal Problem:   Postural dizziness with presyncope   SECONDARY DIAGNOSIS:   Past Medical History:  Diagnosis Date  . Allergy   . Anemia   . Cataract   . CLL (chronic lymphocytic leukemia) ([place])   . Glaucoma   . H/O: hysterectomy    Total  . Hyperlipidemia   . Hypertension   . Hypothyroidism   . Impaired fasting glucose   . Lichen sclerosus   . Osteoporosis    Hips  . PAF (paroxysmal atrial fibrillation) ([place])    a. 06/15/2016 Event monitor: 4% afib burden; b. CHA2DS2VASc - 4-->Xarelto.  . Syncope    a. 03/2016 Echo: EF55-60%, no rwma, mild AI/MR, nl PASP; b. 03/2016 48h Holter: no significant arrhythmias/pauses; c. 03/2016 MV: mild apical defect, likely breast attenuation, nl EF, low risk; d. 05/2016 Event monitor: No significant arrhythmia; e. 05/2016 Event monitor: PAF (4%).    HOSPITAL COURSE:   85 year old female with recurrent falls from vasovagal syncope, PAF and heart failure preserved ejection fraction who presented to the emergency room with chest tightness.   1.  Chest tightness: Patient has no symptoms at this time.  She underwent stress test which was negative  EKG shows LBB.   She was evaluated by cardiology.  Troponins were not indicative of ACS.  2.  Near syncope due to elevated blood pressure: Continue lisinopril and Norvasc with parameters  3.  PAF: Patient currently in normal sinus rhythm Continue Xarelto for stroke prevention This is adjusted for creatine clearance as per pharamcy to 15 mg daily.  4.  Chronic heart failure with  preserved ejection fraction without signs of exacerbation  5.  Hypothyroidism: Continue Synthroid  6. hyperlipidemia: Continue statin  DISCHARGE CONDITIONS AND DIET:  Stable heart healthy diet  CONSULTS OBTAINED:  Treatment Team:  Minna Merritts, MD  DRUG ALLERGIES:   Allergies  Allergen Reactions  . Other   . Alphagan [Brimonidine] Itching and Rash  . Pravachol [Pravastatin Sodium] Itching and Rash  . Pravastatin Itching, Other (See Comments) and Rash    DISCHARGE MEDICATIONS:   Allergies as of 02/26/2019      Reactions   Other    Alphagan [brimonidine] Itching, Rash   Pravachol [pravastatin Sodium] Itching, Rash   Pravastatin Itching, Other (See Comments), Rash      Medication List    TAKE these medications   Accu-Chek Softclix Lancets lancets Use as instructed; check once a day; H40.1131   alendronate 70 MG tablet Commonly known as: FOSAMAX Take 1 tablet (70 mg total) by mouth every 7 (seven) days. Take with a full glass of water on an empty stomach.   amLODipine 2.5 MG tablet Commonly known as: NORVASC TAKE 1 TABLET BY MOUTH ONCE DAILY   atorvastatin 20 MG tablet Commonly known as: LIPITOR Take 1 tablet (20 mg total) by mouth daily.   B-12 1000 MCG/ML Kit Inject 1,000 mcg as directed every 30 (thirty) days.   CALCIUM 600 + D PO Take 1 tablet by mouth daily.   cholecalciferol 1000 units tablet Commonly known as: VITAMIN D Take  1,000 Units by mouth daily.   famotidine 20 MG tablet Commonly known as: PEPCID Take 1 tablet (20 mg total) by mouth at bedtime.   Fish Oil 1200 MG Caps Take by mouth daily.   glucose blood test strip Commonly known as: Accu-Chek Aviva Plus Use as instructed, check once a day; H40.1131   IRON PO Take 1 tablet by mouth 2 (two) times daily.   latanoprost 0.005 % ophthalmic solution Commonly known as: XALATAN Place 1 drop into the right eye at bedtime.   levothyroxine 75 MCG tablet Commonly known as:  SYNTHROID TAKE ONE TABLET BY MOUTH ON MONDAYS, WEDNESDAYS, FRIDAYS, AND SUNDAYS.   levothyroxine 88 MCG tablet Commonly known as: SYNTHROID TAKE ONE TABLET BY MOUTH ON TUESDAYS, THURSDAYS, AND SATURDAYS.   lisinopril 5 MG tablet Commonly known as: ZESTRIL Take 5 mg (1 tablet) by mouth daily if Systolic UK>025. Take 2.5 mg (1/2 tablet) by mouth daily is Systolic BP> 427.   MULTIVITAMIN ADULT PO Take by mouth.   Rivaroxaban 15 MG Tabs tablet Commonly known as: XARELTO Take 1 tablet (15 mg total) by mouth daily with supper. What changed:   medication strength  how much to take   timolol 0.5 % ophthalmic solution Commonly known as: TIMOPTIC Place 1 drop into the right eye 2 (two) times daily.         Today   CHIEF COMPLAINT:  No cp overnight doing fine this am   VITAL SIGNS:  Blood pressure (!) 147/52, pulse (!) 55, temperature 98.4 F (36.9 C), temperature source Oral, resp. rate 18, height '5\' 2"'  (1.575 m), weight 62 kg, SpO2 98 %.   REVIEW OF SYSTEMS:  Review of Systems  Constitutional: Negative.  Negative for chills, fever and malaise/fatigue.  HENT: Negative.  Negative for ear discharge, ear pain, hearing loss, nosebleeds and sore throat.   Eyes: Negative.  Negative for blurred vision and pain.  Respiratory: Negative.  Negative for cough, hemoptysis, shortness of breath and wheezing.   Cardiovascular: Negative.  Negative for chest pain, palpitations and leg swelling.  Gastrointestinal: Negative.  Negative for abdominal pain, blood in stool, diarrhea, nausea and vomiting.  Genitourinary: Negative.  Negative for dysuria.  Musculoskeletal: Negative.  Negative for back pain.  Skin: Negative.   Neurological: Negative for dizziness, tremors, speech change, focal weakness, seizures and headaches.  Endo/Heme/Allergies: Negative.  Does not bruise/bleed easily.  Psychiatric/Behavioral: Negative.  Negative for depression, hallucinations and suicidal ideas.      PHYSICAL EXAMINATION:  GENERAL:  85 y.o.-year-old patient lying in the bed with no acute distress.  NECK:  Supple, no jugular venous distention. No thyroid enlargement, no tenderness.  LUNGS: Normal breath sounds bilaterally, no wheezing, rales,rhonchi  No use of accessory muscles of respiration.  CARDIOVASCULAR: S1, S2 normal. No murmurs, rubs, or gallops.  ABDOMEN: Soft, non-tender, non-distended. Bowel sounds present. No organomegaly or mass.  EXTREMITIES: No pedal edema, cyanosis, or clubbing.  PSYCHIATRIC: The patient is alert and oriented x 3.  SKIN: No obvious rash, lesion, or ulcer.   DATA REVIEW:   CBC Recent Labs  Lab 02/26/19 0608  WBC 6.1  HGB 10.9*  HCT 32.7*  PLT 162    Chemistries  Recent Labs  Lab 02/26/19 0608  NA 137  K 4.2  CL 103  CO2 26  GLUCOSE 120*  BUN 11  CREATININE 0.59  CALCIUM 8.7*    Cardiac Enzymes No results for input(s): TROPONINI in the last 168 hours.  Microbiology Results  '@[email or web address]' @  RADIOLOGY:  Dg Chest Portable 1 View  Result Date: 02/25/2019 CLINICAL DATA:  Central chest pain EXAM: PORTABLE CHEST 1 VIEW COMPARISON:  10/23/2016 FINDINGS: Tiny left pleural effusion or thickening. Borderline cardiomegaly. No consolidation. Aortic atherosclerosis. No pneumothorax. IMPRESSION: Tiny left pleural effusion or thickening. Otherwise no acute interval change. Electronically Signed   By: Donavan Foil M.D.   On: 02/25/2019 16:46      Allergies as of 02/26/2019      Reactions   Other    Alphagan [brimonidine] Itching, Rash   Pravachol [pravastatin Sodium] Itching, Rash   Pravastatin Itching, Other (See Comments), Rash      Medication List    TAKE these medications   Accu-Chek Softclix Lancets lancets Use as instructed; check once a day; H40.1131   alendronate 70 MG tablet Commonly known as: FOSAMAX Take 1 tablet (70 mg total) by mouth every 7 (seven) days. Take with a full glass of water on an empty stomach.    amLODipine 2.5 MG tablet Commonly known as: NORVASC TAKE 1 TABLET BY MOUTH ONCE DAILY   atorvastatin 20 MG tablet Commonly known as: LIPITOR Take 1 tablet (20 mg total) by mouth daily.   B-12 1000 MCG/ML Kit Inject 1,000 mcg as directed every 30 (thirty) days.   CALCIUM 600 + D PO Take 1 tablet by mouth daily.   cholecalciferol 1000 units tablet Commonly known as: VITAMIN D Take 1,000 Units by mouth daily.   famotidine 20 MG tablet Commonly known as: PEPCID Take 1 tablet (20 mg total) by mouth at bedtime.   Fish Oil 1200 MG Caps Take by mouth daily.   glucose blood test strip Commonly known as: Accu-Chek Aviva Plus Use as instructed, check once a day; H40.1131   IRON PO Take 1 tablet by mouth 2 (two) times daily.   latanoprost 0.005 % ophthalmic solution Commonly known as: XALATAN Place 1 drop into the right eye at bedtime.   levothyroxine 75 MCG tablet Commonly known as: SYNTHROID TAKE ONE TABLET BY MOUTH ON MONDAYS, WEDNESDAYS, FRIDAYS, AND 'SUNDAYS.   levothyroxine 88 MCG tablet Commonly known as: SYNTHROID TAKE ONE TABLET BY MOUTH ON TUESDAYS, THURSDAYS, AND SATURDAYS.   lisinopril 5 MG tablet Commonly known as: ZESTRIL Take 5 mg (1 tablet) by mouth daily if Systolic BP>160. Take 2.5 mg (1/2 tablet) by mouth daily is Systolic BP> 140.   MULTIVITAMIN ADULT PO Take by mouth.   Rivaroxaban 15 MG Tabs tablet Commonly known as: XARELTO Take 1 tablet (15 mg total) by mouth daily with supper. What changed:   medication strength  how much to take   timolol 0.5 % ophthalmic solution Commonly known as: TIMOPTIC Place 1 drop into the right eye 2 (two) times daily.         Management plans discussed with the patient and she is in agreement. Stable for discharge   Patient should follow up with cardiology  CODE STATUS:     Code Status Orders  (From admission, onward)         Start     Ordered   02/25/19 2154  Full code  Continuous     09' /01/20  2153        Code Status History    Date Active Date Inactive Code Status Order ID Comments User Context   05/24/2016 1333 05/25/2016 2126 Full Code [id number]  Fritzi Mandes, MD Inpatient   Advance Care Planning Activity    Advance Directive Documentation     Most Recent Value  Type of Advance Directive  Healthcare Power of Attorney  Pre-existing out of facility DNR order (yellow form or pink MOST form)  -  "MOST" Form in Place?  -      TOTAL TIME TAKING CARE OF THIS PATIENT: 38 minutes.    Note: This dictation was prepared with Dragon dictation along with smaller phrase technology. Any transcriptional errors that result from this process are unintentional.  Bettey Costa M.D on 02/26/2019 at 1:10 PM  Between 7am to 6pm - Pager - [phone] After 6pm go to [email or web address] - password [email or web address] [facility]  Office  [phone]  CC: Primary care physician; Arnetha Courser, MD

## 2019-02-26 NOTE — Progress Notes (Signed)
Sydney Mcdaniel to be D/C'd Home with home health per MD order.  Discussed prescriptions and follow up appointments with the patient. Prescriptions given to patient, medication list explained in detail. Pt verbalized understanding.  Allergies as of 02/26/2019      Reactions   Other    Alphagan [brimonidine] Itching, Rash   Pravachol [pravastatin Sodium] Itching, Rash   Pravastatin Itching, Other (See Comments), Rash      Medication List    TAKE these medications   Accu-Chek Softclix Lancets lancets Use as instructed; check once a day; H40.1131   alendronate 70 MG tablet Commonly known as: FOSAMAX Take 1 tablet (70 mg total) by mouth every 7 (seven) days. Take with a full glass of water on an empty stomach.   amLODipine 2.5 MG tablet Commonly known as: NORVASC TAKE 1 TABLET BY MOUTH ONCE DAILY   atorvastatin 20 MG tablet Commonly known as: LIPITOR Take 1 tablet (20 mg total) by mouth daily.   B-12 1000 MCG/ML Kit Inject 1,000 mcg as directed every 30 (thirty) days.   CALCIUM 600 + D PO Take 1 tablet by mouth daily.   cholecalciferol 1000 units tablet Commonly known as: VITAMIN D Take 1,000 Units by mouth daily.   famotidine 20 MG tablet Commonly known as: PEPCID Take 1 tablet (20 mg total) by mouth at bedtime.   Fish Oil 1200 MG Caps Take by mouth daily.   glucose blood test strip Commonly known as: Accu-Chek Aviva Plus Use as instructed, check once a day; H40.1131   IRON PO Take 1 tablet by mouth 2 (two) times daily.   latanoprost 0.005 % ophthalmic solution Commonly known as: XALATAN Place 1 drop into the right eye at bedtime.   levothyroxine 75 MCG tablet Commonly known as: SYNTHROID TAKE ONE TABLET BY MOUTH ON MONDAYS, WEDNESDAYS, FRIDAYS, AND SUNDAYS.   levothyroxine 88 MCG tablet Commonly known as: SYNTHROID TAKE ONE TABLET BY MOUTH ON TUESDAYS, THURSDAYS, AND SATURDAYS.   lisinopril 5 MG tablet Commonly known as: ZESTRIL Take 5 mg (1 tablet) by  mouth daily if Systolic VQ>945. Take 2.5 mg (1/2 tablet) by mouth daily is Systolic BP> 038.   MULTIVITAMIN ADULT PO Take by mouth.   Rivaroxaban 15 MG Tabs tablet Commonly known as: XARELTO Take 1 tablet (15 mg total) by mouth daily with supper. What changed:   medication strength  how much to take   timolol 0.5 % ophthalmic solution Commonly known as: TIMOPTIC Place 1 drop into the right eye 2 (two) times daily.       Vitals:   02/26/19 1123 02/26/19 1226  BP: (!) 173/68 (!) 147/52  Pulse: 81 (!) 55  Resp:    Temp:    SpO2: 98%     Tele box removed and discharged. Skin clean, dry and intact without evidence of skin break down, no evidence of skin tears noted. IV catheter discontinued intact. Site without signs and symptoms of complications. Dressing and pressure applied. Pt denies pain at this time. No complaints noted.  An After Visit Summary was printed and given to the patient. Patient escorted via [place], and D/C home via private auto.  Rolley Sims

## 2019-02-26 NOTE — Consult Note (Signed)
Cardiology Consultation:   Patient ID: Sydney Mcdaniel MRN: [id number]; DOB: Mar 22, 1925  Admit date: 02/25/2019 Date of Consult: 02/26/2019  Primary Care Provider: Arnetha Courser, MD Primary Cardiologist: Nelva Bush, MD  Primary Electrophysiologist:  None    Patient Profile:   Sydney Mcdaniel is a 85 y.o. female with a hx of recurrent falls felt to be due to orthostatic hypotension and vasovagal syncope, paroxysmal atrial fibrillation, HFpEF, hypertension, CLL, vitamin B12 deficiency, and anemia, who is being seen today for the evaluation of HTN and h/o presyncope at the request of Dr. Boyce Medici.  History of Present Illness:   Sydney Mcdaniel is a 85 yo female with PMH as above and last seen in the office on 02/06/2019. She has a history of syncope. She has noted episodes in the past during which she feels "funny" and like "looking through water." She has also noted intermittent leg pain in the past (L>R). As a result, she took a break from her statin medication; however, the statin was restarted when it did not change her sx. When last seen in the office, she c/o L hip pain and swelling, which was ongoing. She was ambulating with a cane. No recent falls.  On 9/1, she presented to [facility] ED with c/o elevated BP and daughter concerned for near syncope. Earlier that morning, the patient skipped her AM dose of lisinopril, given her BP was reportedly low upon waking. She ate lunch and took her amlodipine around 1:15PM. After she finished watching her afternoon soap opera, and approximately two hours after eating lunch, she experienced 4/10 in severity, non-radiating, non-pleuritic, substernal chest tightness that lasted approximately 15 minutes. The chest tightness was associated with flushing. She called her daughter, who was concerned as "her eyes did this thing that they do when she is about to pass out." Her daughter also noted she felt clammy. Patient, however,  denied any symptoms of near syncope.  No associated SOB, DOE, dizziness, nausea, or emesis. In the ED, the patient had negative HS Tn and SBP 170 with last office visit SBP ~160. Patient denied chest pain since her original discomfort. EKG showed SR and an existing incomplete LBBB now progressed to LBBB. No acute ST/T changes. Cardiology consulted.  Heart Pathway Score:     Past Medical History:  Diagnosis Date   Allergy    Anemia    Cataract    CLL (chronic lymphocytic leukemia) ([place])    Glaucoma    H/O: hysterectomy    Total   Hyperlipidemia    Hypertension    Hypothyroidism    Impaired fasting glucose    Lichen sclerosus    Osteoporosis    Hips   PAF (paroxysmal atrial fibrillation) ([place])    a. 06/15/2016 Event monitor: 4% afib burden; b. CHA2DS2VASc - 4-->Xarelto.   Syncope    a. 03/2016 Echo: EF55-60%, no rwma, mild AI/MR, nl PASP; b. 03/2016 48h Holter: no significant arrhythmias/pauses; c. 03/2016 MV: mild apical defect, likely breast attenuation, nl EF, low risk; d. 05/2016 Event monitor: No significant arrhythmia; e. 05/2016 Event monitor: PAF (4%).    Past Surgical History:  Procedure Laterality Date   APPENDECTOMY     EYE SURGERY     Glaucoma   TOTAL ABDOMINAL HYSTERECTOMY       Home Medications:  Prior to Admission medications   Medication Sig Start Date End Date Taking? Authorizing Provider  alendronate (FOSAMAX) 70 MG tablet Take 1 tablet (70 mg total) by mouth every 7 (seven) days.  Take with a full glass of water on an empty stomach. 08/22/18  Yes Lada, Satira Anis, MD  amLODipine (NORVASC) 2.5 MG tablet TAKE 1 TABLET BY MOUTH ONCE DAILY 06/27/18  Yes End, Harrell Gave, MD  atorvastatin (LIPITOR) 20 MG tablet Take 1 tablet (20 mg total) by mouth daily. 09/05/18 02/25/19 Yes End, Harrell Gave, MD  Calcium Carb-Cholecalciferol (CALCIUM 600 + D PO) Take 1 tablet by mouth daily.    Yes [provider]  cholecalciferol (VITAMIN D) 1000 units tablet Take 1,000 Units by mouth daily.   Yes  [provider]  Cyanocobalamin (B-12) 1000 MCG/ML KIT Inject 1,000 mcg as directed every 30 (thirty) days.   Yes [provider]  famotidine (PEPCID) 20 MG tablet Take 1 tablet (20 mg total) by mouth at bedtime. 08/22/18  Yes Lada, Satira Anis, MD  IRON PO Take 1 tablet by mouth 2 (two) times daily.    Yes [provider]  latanoprost (XALATAN) 0.005 % ophthalmic solution Place 1 drop into the right eye at bedtime.  04/24/15  Yes [provider]  levothyroxine (SYNTHROID) 75 MCG tablet TAKE ONE TABLET BY MOUTH ON MONDAYS, WEDNESDAYS, FRIDAYS, AND SUNDAYS. 10/30/18  Yes Hubbard Hartshorn, FNP  levothyroxine (SYNTHROID) 88 MCG tablet TAKE ONE TABLET BY MOUTH ON TUESDAYS, THURSDAYS, AND SATURDAYS. 10/30/18  Yes Hubbard Hartshorn, FNP  lisinopril (PRINIVIL,ZESTRIL) 5 MG tablet Take 5 mg (1 tablet) by mouth daily if Systolic ZG>017. Take 2.5 mg (1/2 tablet) by mouth daily is Systolic BP> 494. 4/96/75  Yes End, Harrell Gave, MD  Multiple Vitamins-Minerals (MULTIVITAMIN ADULT PO) Take by mouth.   Yes [provider]  Omega-3 Fatty Acids (FISH OIL) 1200 MG CAPS Take by mouth daily.   Yes [provider]  rivaroxaban (XARELTO) 20 MG TABS tablet Take 1 tablet (20 mg total) by mouth daily with supper. 07/09/18  Yes End, Harrell Gave, MD  timolol (TIMOPTIC) 0.5 % ophthalmic solution Place 1 drop into the right eye 2 (two) times daily.  04/24/15  Yes [provider]  ACCU-CHEK SOFTCLIX LANCETS lancets Use as instructed; check once a day; H40.1131 05/08/16   Arnetha Courser, MD  glucose blood (ACCU-CHEK AVIVA PLUS) test strip Use as instructed, check once a day; H40.1131 05/08/16   Arnetha Courser, MD    Inpatient Medications: Scheduled Meds:  amLODipine  2.5 mg Oral Daily   atorvastatin  20 mg Oral q1800   cholecalciferol  1,000 Units Oral Daily   famotidine  20 mg Oral QHS   latanoprost  1 drop Right Eye QHS   levothyroxine  75 mcg Oral Once per day on  Sun Mon Wed Fri   lisinopril  10 mg Oral Daily   rivaroxaban  20 mg Oral Q supper   sodium chloride flush  10 mL Intravenous Q12H   timolol  1 drop Right Eye BID   Continuous Infusions:  sodium chloride 50 mL/hr at 02/25/19 2200   PRN Meds: docusate sodium, hydrALAZINE  Allergies:    Allergies  Allergen Reactions   Other    Alphagan [Brimonidine] Itching and Rash   Pravachol [Pravastatin Sodium] Itching and Rash   Pravastatin Itching, Other (See Comments) and Rash    Social History:   Social History   Socioeconomic History   Marital status: Widowed    Spouse name: Not on file   Number of children: 2   Years of education: Not on file   Highest education level: High school graduate  Occupational History  Occupation: retired  Scientist, product/process development strain: Not hard at [employer] insecurity    Worry: Never true    Inability: Never true   Transportation needs    Medical: No    Non-medical: No  Tobacco Use   Smoking status: Never Smoker   Smokeless tobacco: Never Used  Substance and Sexual Activity   Alcohol use: No   Drug use: No   Sexual activity: Not Currently  Lifestyle   Physical activity    Days per week: 7 days    Minutes per session: 30 min   Stress: Not at all  Relationships   Social connections    Talks on phone: More than three times a week    Gets together: More than three times a week    Attends religious service: More than 4 times per year    Active member of club or organization: No    Attends meetings of clubs or organizations: Never    Relationship status: Widowed   Intimate partner violence    Fear of current or ex partner: No    Emotionally abused: No    Physically abused: No    Forced sexual activity: No  Other Topics Concern   Not on file  Social History Narrative   Not on file    Family History:    Family History  Problem Relation Age of Onset   Heart attack Mother    Glaucoma Mother      Heart attack Father    Parkinson's disease Brother    Heart attack Brother      ROS:  Please see the history of present illness.  Review of Systems  Constitutional: Negative for malaise/fatigue.  Respiratory: Negative for cough, shortness of breath and wheezing.   Cardiovascular: Positive for chest pain and palpitations.       In the setting of not taking her BP meds  Gastrointestinal: Negative for abdominal pain, nausea and vomiting.  Genitourinary: Negative for hematuria.  Neurological: Negative for dizziness.  All other systems reviewed and are negative.   All other ROS reviewed and negative.     Physical Exam/Data:   Vitals:   02/25/19 2000 02/25/19 2201 02/26/19 0340 02/26/19 0441  BP: (!) 150/46 (!) 167/59 (!) 180/58 (!) 144/44  Pulse: 63 65 63 (!) 57  Resp: '13 20 20   ' Temp:  98.1 F (36.7 C) 97.8 F (36.6 C)   TempSrc:  Oral Oral   SpO2: 100% 100% 100%   Weight:  62.1 kg 62 kg   Height:  '5\' 2"'  (1.575 m)      Intake/Output Summary (Last 24 hours) at 02/26/2019 0706 Last data filed at 02/26/2019 0700 Gross per 24 hour  Intake 449.04 ml  Output 800 ml  Net -350.96 ml   Last 3 Weights 02/26/2019 02/25/2019 02/25/2019  Weight (lbs) 136 lb 11.2 oz 136 lb 14.5 oz 137 lb  Weight (kg) 62.007 kg 62.1 kg 62.143 kg     Body mass index is 25 kg/m.  General:  Frail and elderly female, NAD HEENT: normal Lymph: no adenopathy Neck: no JVD Vascular: radial pulses 1+ bilaterally Cardiac:  Soft hear sounds, normal S1, S2; RRR; 1/6 systolic murmur  Lungs:  clear to auscultation bilaterally, no wheezing, rhonchi or rales  Abd: soft, nontender, no hepatomegaly  Ext: mild bilateral lower extremity ischemia Musculoskeletal:  No deformities, BUE and BLE strength normal and equal Skin: warm and dry  Neuro:  no focal abnormalities  noted Psych:  Normal affect   EKG:  The EKG was personally reviewed and demonstrates:  NSR, PVCs, and previously existing LBBB now progressed to full  LBBB, no acute ST/T changes   Telemetry:  Telemetry was personally reviewed and demonstrates:  SR-SB  Relevant CV Studies: EP Procedures and Devices:  Event monitor (06/15/16): Dominantly sinus rhythm with paroxysmal atrial fibrillation (4% burden). No significant pauses.  Event monitor(06/02/16): Normal sinus rhythm with first-degree AV block. No significant arrhythmia.  Holter monitor (04/17/16): Predominantly sinus rhythm with first-degree AV block. PACs and short atrial runs lasting up to 4 beats noted. Rare PVCs.  Non-Invasive Evaluation(s):  Carotid Doppler (04/30/17): Moderate (40-59%) proximal ICA stenoses bilaterally. Greater than 50% left external carotid artery stenosis. No significant change from prior study in 04/2016.  TTE (04/17/16): Normal LV size. LVEF 55-60%. Normal wall motion and diastolic function. Mild aortic and mitral regurgitation. Normal RV size and function. Normal pulmonary artery pressure.  Pharmacologic MPI (04/10/16): Low risk study with small fixed defect at the apex, likely due to breast attenuation. No ischemia. LVEF 55-65%.  Laboratory Data:  High Sensitivity Troponin:   Recent Labs  Lab 02/25/19 1615 02/25/19 1817 02/25/19 2233  TROPONINIHS '6 9 11     ' Cardiac EnzymesNo results for input(s): TROPONINI in the last 168 hours. No results for input(s): TROPIPOC in the last 168 hours.  Chemistry Recent Labs  Lab 02/25/19 1615  NA 129*  K 4.0  CL 97*  CO2 22  GLUCOSE 103*  BUN 15  CREATININE 0.69  CALCIUM 8.8*  GFRNONAA >60  GFRAA >60  ANIONGAP 10    No results for input(s): PROT, ALBUMIN, AST, ALT, ALKPHOS, BILITOT in the last 168 hours. Hematology Recent Labs  Lab 02/25/19 1615 02/26/19 0608  WBC 7.1 6.1  RBC 3.34* 3.52*  HGB 10.7* 10.9*  HCT 31.1* 32.7*  MCV 93.1 92.9  MCH 32.0 31.0  MCHC 34.4 33.3  RDW 13.3 13.2  PLT 157 162   BNPNo results for input(s): BNP, PROBNP in the last 168 hours.  DDimer No results  for input(s): DDIMER in the last 168 hours.   Radiology/Studies:  Dg Chest Portable 1 View  Result Date: 02/25/2019 CLINICAL DATA:  Central chest pain EXAM: PORTABLE CHEST 1 VIEW COMPARISON:  10/23/2016 FINDINGS: Tiny left pleural effusion or thickening. Borderline cardiomegaly. No consolidation. Aortic atherosclerosis. No pneumothorax. IMPRESSION: Tiny left pleural effusion or thickening. Otherwise no acute interval change. Electronically Signed   By: Donavan Foil M.D.   On: 02/25/2019 16:46    Assessment and Plan:   HTN --SBP Elevated at presentation. --Associated CP now resolved. Atypical CP. EKG without acute ST/T changes and incomplete LBBB progressed to full LBBB. SR. HS Tn negative.  --Per MD, stress test.  --Echo ordered in the ED. --Continue medical management with PRN lisinopril, amlodipine.  History of Near syncope --No sx consistent with near syncope now or before admission. Daughter reportedly concerned for near syncope.  Incomplete LBBB, progressed to LBBB --No current CP or concerning Sx. --Echo ordered in the ED. --HS Tn negative.  --EKG without acute ST/T changes. Incomplete LBBB (known) progressed to full LBBB.  --Per MD, stress test for today.  PAF --NSR. --Continue medical management.  Chronic HFpEF --No SOB. Euvolemic on exam. --Continue medical management.  HLD --Continue statin.   For questions or updates, please contact [place] Please consult [email or web address] for contact info under     Signed, Arvil Chaco, PA-C  02/26/2019 7:07 AM

## 2019-02-26 NOTE — TOC Transition Note (Signed)
Transition of Care [facility]) - CM/SW Discharge Note   Patient Details  Name: Sydney Mcdaniel MRN: [id number] Date of Birth: February 24, 1925  [facility]) CM/SW Contact:  B[place]ly Sessions, RN Phone Number: 02/26/2019, 4:47 PM   Clinical Narrative:     RNCM notified that patient was discharging today Home health orders in place for RN, PT, and aide  Daughter at bedside  Patient lives at home alone.  Has RW for ambulation  Daughter lives locally and provides transportation for patient  PCP [place] [facility], and [facility]  Prior to admission patient was on 20 mg Xarelto.  She has a 3 month supply at home.  Dose will be lowered at discharge to 15 MG.  Patient will need to pick up one month supply from [facility].  Then her cardiologist write a 3 month script to send to [place] was printed.  RNCM provided 30 day coupon.  Daughter to take script and coupon to [place] after discharge.  RNCM attempted to call [facility] x3 in order to call in the rx and get an out of pocket cost, However I was unable to reach anyone  Home health orders for RN, PT, and aide.  Patient and daughter agreeable.  They states they do not have a preference of home health agency.  Referral made and accepted to [facility] [place] with [place] signing off   Final next level of care: [place] Barriers to Discharge: Barriers Resolved   Patient Goals and CMS Choice     Choice offered to / list presented to : Patient  Discharge Placement                       Discharge Plan and Services                          HH Arranged: RN, PT, Nurse's Aide [facility] Agency: [place] ([facility]) Date [facility] Agency Contacted: 02/26/19 Time [place]: [place] Representative spoke with at [place]: [place] ([place]) Interventions     Readmission Risk Interventions No flowsheet data found.

## 2019-02-26 NOTE — Progress Notes (Signed)
*  PRELIMINARY RESULTS* Echocardiogram 2D Echocardiogram has been performed.  Sydney Mcdaniel 02/26/2019, 12:39 PM

## 2019-02-28 ENCOUNTER — Other Ambulatory Visit: Payer: Self-pay

## 2019-03-01 DIAGNOSIS — I110 Hypertensive heart disease with heart failure: Secondary | ICD-10-CM | POA: Diagnosis not present

## 2019-03-01 DIAGNOSIS — Z7901 Long term (current) use of anticoagulants: Secondary | ICD-10-CM | POA: Diagnosis not present

## 2019-03-01 DIAGNOSIS — R0789 Other chest pain: Secondary | ICD-10-CM | POA: Diagnosis not present

## 2019-03-01 DIAGNOSIS — R7301 Impaired fasting glucose: Secondary | ICD-10-CM | POA: Diagnosis not present

## 2019-03-01 DIAGNOSIS — C9110 Chronic lymphocytic leukemia of B-cell type not having achieved remission: Secondary | ICD-10-CM | POA: Diagnosis not present

## 2019-03-01 DIAGNOSIS — I951 Orthostatic hypotension: Secondary | ICD-10-CM | POA: Diagnosis not present

## 2019-03-01 DIAGNOSIS — M810 Age-related osteoporosis without current pathological fracture: Secondary | ICD-10-CM | POA: Diagnosis not present

## 2019-03-01 DIAGNOSIS — R55 Syncope and collapse: Secondary | ICD-10-CM | POA: Diagnosis not present

## 2019-03-01 DIAGNOSIS — H409 Unspecified glaucoma: Secondary | ICD-10-CM | POA: Diagnosis not present

## 2019-03-01 DIAGNOSIS — I447 Left bundle-branch block, unspecified: Secondary | ICD-10-CM | POA: Diagnosis not present

## 2019-03-01 DIAGNOSIS — I480 Paroxysmal atrial fibrillation: Secondary | ICD-10-CM | POA: Diagnosis not present

## 2019-03-01 DIAGNOSIS — H269 Unspecified cataract: Secondary | ICD-10-CM | POA: Diagnosis not present

## 2019-03-01 DIAGNOSIS — L900 Lichen sclerosus et atrophicus: Secondary | ICD-10-CM | POA: Diagnosis not present

## 2019-03-01 DIAGNOSIS — I5032 Chronic diastolic (congestive) heart failure: Secondary | ICD-10-CM | POA: Diagnosis not present

## 2019-03-01 DIAGNOSIS — Z9181 History of falling: Secondary | ICD-10-CM | POA: Diagnosis not present

## 2019-03-01 DIAGNOSIS — E538 Deficiency of other specified B group vitamins: Secondary | ICD-10-CM | POA: Diagnosis not present

## 2019-03-01 DIAGNOSIS — E039 Hypothyroidism, unspecified: Secondary | ICD-10-CM | POA: Diagnosis not present

## 2019-03-01 DIAGNOSIS — E785 Hyperlipidemia, unspecified: Secondary | ICD-10-CM | POA: Diagnosis not present

## 2019-03-01 DIAGNOSIS — D649 Anemia, unspecified: Secondary | ICD-10-CM | POA: Diagnosis not present

## 2019-03-04 ENCOUNTER — Telehealth: Payer: Self-pay | Admitting: Family Medicine

## 2019-03-04 ENCOUNTER — Inpatient Hospital Stay: Payer: Medicare Other | Attending: Hematology and Oncology

## 2019-03-04 ENCOUNTER — Other Ambulatory Visit: Payer: Self-pay

## 2019-03-04 VITALS — BP 183/52 | HR 52 | Temp 98.4°F | Resp 20

## 2019-03-04 DIAGNOSIS — E538 Deficiency of other specified B group vitamins: Secondary | ICD-10-CM

## 2019-03-04 MED ORDER — CYANOCOBALAMIN 1000 MCG/ML IJ SOLN
1000.0000 ug | Freq: Once | INTRAMUSCULAR | Status: AC
  Administered 2019-03-04: 11:00:00 1000 ug via INTRAMUSCULAR

## 2019-03-04 NOTE — Telephone Encounter (Signed)
Last seen in July by Benjamine Mola. You have never seen. You ok with virtual appointment

## 2019-03-04 NOTE — Progress Notes (Signed)
Blood pressure elevated.  Patient reports that she only took a 1/2 pill this morning and will take the other half.  She has an appointment with her cardiologist coming up soon.  She was just in the hospital and they were addressing her blood pressure issues.  Daughter informed that the patient needs to take her medication.  She verbalized understanding.

## 2019-03-04 NOTE — Telephone Encounter (Signed)
If this is a new order, will need to do hospital discharge follow up.  If this is an ongoing order, we can approve now.

## 2019-03-04 NOTE — Telephone Encounter (Signed)
Copied from [place] [id number]. Topic: Quick Communication - Home Health Verbal Orders >> Mar 04, 2019  3:07 PM Yvette Rack wrote: Caller/Agency: Cherly Anderson with [facility]  Callback Number: [phone] Requesting OT/PT/Skilled Nursing/Social Work/Speech Therapy: PT Frequency: 2 times a week for 3 weeks and 1 time a week for 1 week

## 2019-03-04 NOTE — Telephone Encounter (Signed)
[place] with telephone call or virtual.

## 2019-03-04 NOTE — Telephone Encounter (Signed)
Home Health Verbal Orders - Caller/AgencyBurr Medico Number: [id number] Requesting OT/PT/Skilled Nursing/Social Work/Speech Therapy: Skilled Nursing Frequency: 1x 4w     Once every other week for   5w

## 2019-03-05 NOTE — Telephone Encounter (Signed)
It can be done virtuaL

## 2019-03-05 NOTE — Telephone Encounter (Signed)
appt scheduled with Raquel Sarna for this Friday. Can this be done virtually or should she come into the office?

## 2019-03-05 NOTE — Telephone Encounter (Signed)
Pt is being seen this Friday and it will be a telephone visit

## 2019-03-05 NOTE — Telephone Encounter (Signed)
Malinda, with [facility], calling about these orders. She is requesting call back from [place] with clarification. Unable to reach office.    Cb# [phone]

## 2019-03-05 NOTE — Telephone Encounter (Signed)
Please make her appointment

## 2019-03-06 NOTE — Telephone Encounter (Signed)
Melinda with [facility] is calling to follow up on verbal order request.  Please call to discuss at [phone]

## 2019-03-06 NOTE — Telephone Encounter (Signed)
Left message for [facility]

## 2019-03-06 NOTE — Telephone Encounter (Signed)
Left message for Sydney Mcdaniel

## 2019-03-07 ENCOUNTER — Other Ambulatory Visit: Payer: Self-pay

## 2019-03-07 ENCOUNTER — Ambulatory Visit (INDEPENDENT_AMBULATORY_CARE_PROVIDER_SITE_OTHER): Payer: Medicare Other | Admitting: Family Medicine

## 2019-03-07 ENCOUNTER — Encounter: Payer: Self-pay | Admitting: Family Medicine

## 2019-03-07 DIAGNOSIS — E039 Hypothyroidism, unspecified: Secondary | ICD-10-CM

## 2019-03-07 DIAGNOSIS — I5032 Chronic diastolic (congestive) heart failure: Secondary | ICD-10-CM | POA: Diagnosis not present

## 2019-03-07 DIAGNOSIS — I10 Essential (primary) hypertension: Secondary | ICD-10-CM | POA: Diagnosis not present

## 2019-03-07 DIAGNOSIS — I480 Paroxysmal atrial fibrillation: Secondary | ICD-10-CM | POA: Diagnosis not present

## 2019-03-07 DIAGNOSIS — R0789 Other chest pain: Secondary | ICD-10-CM | POA: Diagnosis not present

## 2019-03-07 DIAGNOSIS — E7800 Pure hypercholesterolemia, unspecified: Secondary | ICD-10-CM | POA: Diagnosis not present

## 2019-03-07 NOTE — Progress Notes (Signed)
Name: Sydney Mcdaniel   MRN: [id number]    DOB: 1925-01-18   Date:03/07/2019       Progress Note  Subjective  Chief Complaint  Chief Complaint  Patient presents with  . Follow-up    orders from [facility] spoke to [name] her daughter  . Hospitalization Follow-up    I connected with  [name] on 03/07/19 at 11:20 AM EDT by telephone and verified that I am speaking with the correct person using two identifiers.  I discussed the limitations, risks, security and privacy concerns of performing an evaluation and management service by telephone and the availability of in person appointments. Staff also discussed with the patient that there may be a patient responsible charge related to this service. Patient Location: Home Provider Location: Office Additional Individuals present: Daughter - [name]  HPI  Pt presents for hospital discharge follow up - she was   1.  Chest tightness: Patient has no symptoms at this time - denies chest pain, tightness, or shortness of breath.  She underwent stress test which was negative.  EKG showed LBB. She was evaluated by cardiology.  Troponins were not indicative of ACS.  2.  Near syncope due to elevated blood pressure: Continue lisinopril and Norvasc with parameters. BP's have been within normal range 155/65 highest; averaging in the 140's/60-70's.  If over 140 takes 1/2 tablet; if over 160, takes 1 tablet of lisinopril; always takes her amlodipine  3.  PAF: Patient currently in normal sinus rhythm.  Continuing Xarelto for stroke prevention This dosing was adjusted for creatine clearance as per pharamcy to 15 mg daily.  No signs or symptoms of bleeding - no epistaxis, gums bleeding, blood in stool, or blood in urine.  No palpitations.  4.  Chronic heart failure with preserved ejection fraction without signs of exacerbation.  Denies BLE edema or shortness of breath.  5.  Hypothyroidism: Continue Synthroid; no hair/skin/nail changes,  palpitations, energy levels are recovering.   6. Hyperlipidemia: Continue statin therapy; tolerating without issues.  Has follow up with Dunn PA-C with cardiology 03/17/2019.  Has PT/OT eval set up for today.   Patient Active Problem List   Diagnosis Date Noted  . Postural dizziness with presyncope 02/25/2019  . Osteoporosis 08/21/2018  . Osteoarthritis of left hip 05/21/2018  . Bilateral carotid artery stenosis 04/19/2018  . History of syncope 10/17/2017  . MCI (mild cognitive impairment) 08/12/2017  . Lung nodules 07/09/2017  . PAF (paroxysmal atrial fibrillation) ([place]) 04/18/2017  . Pleural effusion 10/20/2016  . Hyponatremia 10/20/2016  . Chronic diastolic congestive heart failure ([place]) 06/28/2016  . Primary open-angle glaucoma, bilateral, mild stage 05/08/2016  . Labile blood glucose 04/27/2016  . Bladder prolapse, female, acquired 02/25/2016  . Urinary tract infection 02/25/2016  . 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  . B12 deficiency 01/11/2016  . Anemia 01/10/2016  . CLL (chronic lymphocytic leukemia) ([place]) 01/10/2016  . Hyperkalemia 12/22/2015  . 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 12/21/2015  . Bradycardia 12/21/2015  . Lymphocytosis 06/25/2015  . Impaired fasting glucose 06/22/2015  . Carotid atherosclerosis 06/22/2015  . Essential hypertension 12/22/2014  . Hypercholesteremia 12/22/2014  . Hypothyroidism 12/22/2014    Past Surgical History:  Procedure Laterality Date  . APPENDECTOMY    . EYE SURGERY     Glaucoma  . TOTAL ABDOMINAL HYSTERECTOMY      Family History  Problem Relation Age of Onset  . Heart attack  Mother   . Glaucoma Mother   . Heart attack Father   . Parkinson's disease Brother   . Heart attack Brother     Social History   Socioeconomic History  . Marital status: Widowed    Spouse name: Not on file  . Number of children: 2  . Years of education: Not on file  . Highest  education level: High school graduate  Occupational History  . Occupation: retired  Scientific laboratory technician  . Financial resource strain: Not hard at all  . Food insecurity    Worry: Never true    Inability: Never true  . Transportation needs    Medical: No    Non-medical: No  Tobacco Use  . Smoking status: Never Smoker  . Smokeless tobacco: Never Used  Substance and Sexual Activity  . Alcohol use: No  . Drug use: No  . Sexual activity: Not Currently  Lifestyle  . Physical activity    Days per week: 7 days    Minutes per session: 30 min  . Stress: Not at all  Relationships  . Social connections    Talks on phone: More than three times a week    Gets together: More than three times a week    Attends religious service: More than 4 times per year    Active member of club or organization: No    Attends meetings of clubs or organizations: Never    Relationship status: Widowed  . Intimate partner violence    Fear of current or ex partner: No    Emotionally abused: No    Physically abused: No    Forced sexual activity: No  Other Topics Concern  . Not on file  Social History Narrative  . Not on file     Current Outpatient Medications:  .  ACCU-CHEK SOFTCLIX LANCETS lancets, Use as instructed; check once a day; H40.1131, Disp: 100 each, Rfl: 12 .  alendronate (FOSAMAX) 70 MG tablet, Take 1 tablet (70 mg total) by mouth every 7 (seven) days. Take with a full glass of water on an empty stomach., Disp: 4 tablet, Rfl: 11 .  amLODipine (NORVASC) 2.5 MG tablet, TAKE 1 TABLET BY MOUTH ONCE DAILY, Disp: 90 tablet, Rfl: 2 .  Calcium Carb-Cholecalciferol (CALCIUM 600 + D PO), Take 1 tablet by mouth daily. , Disp: , Rfl:  .  cholecalciferol (VITAMIN D) 1000 units tablet, Take 1,000 Units by mouth daily., Disp: , Rfl:  .  Cyanocobalamin (B-12) 1000 MCG/ML KIT, Inject 1,000 mcg as directed every 30 (thirty) days., Disp: , Rfl:  .  famotidine (PEPCID) 20 MG tablet, Take 1 tablet (20 mg total) by  mouth at bedtime., Disp: 30 tablet, Rfl: 5 .  glucose blood (ACCU-CHEK AVIVA PLUS) test strip, Use as instructed, check once a day; H40.1131, Disp: 100 each, Rfl: 3 .  IRON PO, Take 1 tablet by mouth 2 (two) times daily. , Disp: , Rfl:  .  latanoprost (XALATAN) 0.005 % ophthalmic solution, Place 1 drop into the right eye at bedtime. , Disp: , Rfl:  .  levothyroxine (SYNTHROID) 75 MCG tablet, TAKE ONE TABLET BY MOUTH ON MONDAYS, WEDNESDAYS, FRIDAYS, AND SUNDAYS., Disp: 52 tablet, Rfl: 0 .  levothyroxine (SYNTHROID) 88 MCG tablet, TAKE ONE TABLET BY MOUTH ON TUESDAYS, THURSDAYS, AND SATURDAYS., Disp: 39 tablet, Rfl: 0 .  lisinopril (PRINIVIL,ZESTRIL) 5 MG tablet, Take 5 mg (1 tablet) by mouth daily if Systolic ZO>109. Take 2.5 mg (1/2 tablet) by mouth daily is Systolic BP>  140., Disp: 90 tablet, Rfl: 2 .  Multiple Vitamins-Minerals (MULTIVITAMIN ADULT PO), Take by mouth., Disp: , Rfl:  .  Omega-3 Fatty Acids (FISH OIL) 1200 MG CAPS, Take by mouth daily., Disp: , Rfl:  .  Rivaroxaban (XARELTO) 15 MG TABS tablet, Take 1 tablet (15 mg total) by mouth daily with supper., Disp: 42 tablet, Rfl: 0 .  timolol (TIMOPTIC) 0.5 % ophthalmic solution, Place 1 drop into the right eye 2 (two) times daily. , Disp: , Rfl:  .  atorvastatin (LIPITOR) 20 MG tablet, Take 1 tablet (20 mg total) by mouth daily., Disp: 90 tablet, Rfl: 3  Allergies  Allergen Reactions  . Other   . Alphagan [Brimonidine] Itching and Rash  . Pravachol [Pravastatin Sodium] Itching and Rash  . Pravastatin Itching, Other (See Comments) and Rash    I personally reviewed active problem list, medication list, allergies, notes from last encounter, lab results with the patient/caregiver today.   ROS  Constitutional: Negative for fever or weight change.  Respiratory: Negative for cough and shortness of breath.   Cardiovascular: Negative for chest pain or palpitations.  Gastrointestinal: Negative for abdominal pain, no bowel changes.   Musculoskeletal: Negative for gait problem or joint swelling.  Skin: Negative for rash.  Neurological: Negative for dizziness or headache.  No other specific complaints in a complete review of systems (except as listed in HPI above).  Objective  Virtual encounter, vitals not obtained.  There is no height or weight on file to calculate BMI.  Physical Exam  Pulmonary/Chest: Effort normal. No respiratory distress. Speaking in complete sentences Neurological: Pt is alert and oriented to person, place, and time. Speech is normal.  Psychiatric: Patient has a normal mood and affect. behavior is normal. Judgment and thought content normal.  No results found for this or any previous visit (from the past 72 hour(s)).  PHQ2/9: Depression screen [facility] 2/9 03/07/2019 01/10/2019 08/22/2018 07/02/2018 06/27/2017  Decreased Interest 0 0 0 0 0  Down, Depressed, Hopeless 0 0 0 0 0  PHQ - 2 Score 0 0 0 0 0  Altered sleeping 0 0 0 - -  Tired, decreased energy 0 0 0 - -  Change in appetite 0 0 0 - -  Feeling bad or failure about yourself  0 0 0 - -  Trouble concentrating 0 0 0 - -  Moving slowly or fidgety/restless 0 0 0 - -  Suicidal thoughts 0 0 0 - -  PHQ-9 Score 0 0 0 - -  Difficult doing work/chores Not difficult at all Not difficult at all Not difficult at all - -   PHQ-2/9 Result is negative.    Fall Risk: Fall Risk  03/07/2019 01/10/2019 08/22/2018 07/02/2018 06/27/2017  Falls in the past year? 0 0 0 1 Yes  Number falls in past yr: 0 0 - 1 1  Comment - - - - -  Injury with Fall? 0 0 - 1 No  Comment - - - fell on door threshold with face onto kitchen counter, went to ER -  Risk Factor Category  - - - - -  Risk for fall due to : - - - History of fall(s);Impaired balance/gait;Impaired mobility -  Follow up Falls evaluation completed - - Falls evaluation completed;Falls prevention discussed -    Assessment & Plan  1. Essential hypertension - BP is higher end of normal; will defer to cardiology for  adjustment.  She is feeling well today and will allow for some permissive HTN  due to her advanced age and wanting to avoid hypotension/orthostatic symptoms.    2. PAF (paroxysmal atrial fibrillation) ([place]) - Stable; doing well on Xarelto  3. Chronic diastolic congestive heart failure (H[facility]) - Stable on medications at this time; asymptomatic.  4. Hypothyroidism, unspecified type - Taking medication as prescribed.  5. Hypercholesteremia - Taking statin therapy and fish oil  6. Chest tightness - Asymptomatic; following up with cardiology.   I discussed the assessment and treatment plan with the patient. The patient was provided an opportunity to ask questions and all were answered. The patient agreed with the plan and demonstrated an understanding of the instructions.   The patient was advised to call back or seek an in-person evaluation if the symptoms worsen or if the condition fails to improve as anticipated.  I provided 22 minutes of non-face-to-face time during this encounter.  Hubbard Hartshorn, FNP

## 2019-03-07 NOTE — Telephone Encounter (Signed)
Left message for Raquel Sarna for verbal for orders for [facility]

## 2019-03-07 NOTE — Telephone Encounter (Signed)
Rip Harbour is returning V[place] call

## 2019-03-10 NOTE — Telephone Encounter (Signed)
Sydney Mcdaniel

## 2019-03-10 NOTE — Telephone Encounter (Signed)
Melinda at [facility] notified

## 2019-03-10 NOTE — Telephone Encounter (Signed)
Routing to appropriate office 

## 2019-03-10 NOTE — Telephone Encounter (Signed)
Letta Kocher is calling back for verbal orders. Please advise [phone].

## 2019-03-13 ENCOUNTER — Other Ambulatory Visit: Payer: Self-pay | Admitting: Family Medicine

## 2019-03-13 DIAGNOSIS — E039 Hypothyroidism, unspecified: Secondary | ICD-10-CM

## 2019-03-13 NOTE — Telephone Encounter (Signed)
Requested medication (s) are due for refill today: yes  Requested medication (s) are on the active medication list: yes  Last refill:  11/12/2018  Future visit scheduled: no  Notes to clinic:  Review for refill   Requested Prescriptions  Pending Prescriptions Disp Refills   EUTHYROX 75 MCG tablet [Pharmacy Med Name: Euthyrox 75 MCG Oral Tablet] 52 tablet 0    Sig: TAKE ONE TABLET BY MOUTH ON MONDAYS, [place], FRIDAYS, AND SUNDAYS.     Endocrinology:  Hypothyroid Agents Failed - 03/13/2019 11:28 AM      Failed - TSH needs to be rechecked within 3 months after an abnormal result. Refill until TSH is due.      Passed - TSH in normal range and within 360 days    TSH  Date Value Ref Range Status  02/25/2019 3.349 0.350 - 4.500 uIU/mL Final    Comment:    Performed by a 3rd Generation assay with a functional sensitivity of <=0.01 uIU/mL. Performed at [facility], [address] [place], [place] [address]   01/10/2019 1.88 0.40 - 4.50 mIU/L Final         Passed - Valid encounter within last 12 months    Recent Outpatient Visits          6 days ago Essential hypertension   [place], FNP   2 months ago Hyponatremia   [place], NP   6 months ago Age-related osteoporosis without current pathological fracture   [place], Satira Anis, MD   1 year ago Preventative health care   [place], Satira Anis, MD   2 years ago Preventative health care   [place], MD      Future Appointments            In 4 days Dunn, Areta Haber, PA-C [facility], [facility]   In 2 months Hubbard Hartshorn, [facility], [place]   In 3 months  [facility], [facility]            levothyroxine (SYNTHROID) 88 MCG tablet [Pharmacy Med Name: Levothyroxine Sodium 88 MCG Oral Tablet] 39  tablet 0    Sig: TAKE ONE TABLET BY MOUTH ON TUESDAYS, THURSDAYS, AND SATURDAYS.     Endocrinology:  Hypothyroid Agents Failed - 03/13/2019 11:28 AM      Failed - TSH needs to be rechecked within 3 months after an abnormal result. Refill until TSH is due.      Passed - TSH in normal range and within 360 days    TSH  Date Value Ref Range Status  02/25/2019 3.349 0.350 - 4.500 uIU/mL Final    Comment:    Performed by a 3rd Generation assay with a functional sensitivity of <=0.01 uIU/mL. Performed at [facility], [address] [place], [place] [address]   01/10/2019 1.88 0.40 - 4.50 mIU/L Final         Passed - Valid encounter within last 12 months    Recent Outpatient Visits          6 days ago Essential hypertension   [place], FNP   2 months ago Hyponatremia   [place], NP   6 months ago Age-related osteoporosis without current pathological fracture   [facility] [place], Satira Anis, MD  1 year ago Preventative health care   [place], Satira Anis, MD   2 years ago Preventative health care   [place], Satira Anis, MD      Future Appointments            In 4 days Dunn, Areta Haber, PA-C [facility], [facility]   In 2 months Uvaldo Rising, Astrid Divine, [facility], [place]   In 3 months  [facility], [facility]

## 2019-03-14 NOTE — Telephone Encounter (Signed)
Patient seen on yesterday

## 2019-03-14 NOTE — Progress Notes (Deleted)
Cardiology Office Note    Date:  03/14/2019   ID:  Sydney Mcdaniel, DOB 10-30-1924, MRN [id number]  PCP:  Sydney Courser, MD  Cardiologist:  Sydney Bush, MD  Electrophysiologist:  None   Chief Complaint: Hospital follow up  History of Present Illness:   Sydney Mcdaniel is a 85 y.o. female with history of recurrent falls felt to be secondary to orthostatic hypotension, vasovagal syncope, PAF on Xarelto, HFpEF, newly found LBBB (prior incomplete LBBB), HTN, CLL, B12 deficiency, and anemia who presents for hospital follow up as detailed below.   She was previously followed by Dr. Yvone Mcdaniel, though more recently has established with Dr. Saunders Mcdaniel.   She was seen in the office in 01/2019 for follow up   She was recently admitted in early 02/2019 with chest pain and new LBBB. High-sensitivity troponin was negative x 3. Echo showed an EF of 123456, diastolic dysfunction, normal RVSF and cavity size, mild MR. Lexiscan Myoview showed no significant ischemia, normal wall motion, and was overall a low risk scan. Chest pain was felt to be atypical.   ***   Labs: 02/2019 - HGB 10.9, PLT 162, K+ 4.2, SCr 0.59, TSH normal 12/2018 - AST/ALT normal 06/2018 - A1c 6.0, TC 174, TG 173, HDL 57, LDL 90 Past Medical History:  Diagnosis Date  . Allergy   . Anemia   . Cataract   . CLL (chronic lymphocytic leukemia) ([place])   . Glaucoma   . H/O: hysterectomy    Total  . Hyperlipidemia   . Hypertension   . Hypothyroidism   . Impaired fasting glucose   . Lichen sclerosus   . Osteoporosis    Hips  . PAF (paroxysmal atrial fibrillation) ([place])    a. 06/15/2016 Event monitor: 4% afib burden; b. CHA2DS2VASc - 4-->Xarelto.  . Syncope    a. 03/2016 Echo: EF55-60%, no rwma, mild AI/MR, nl PASP; b. 03/2016 48h Holter: no significant arrhythmias/pauses; c. 03/2016 MV: mild apical defect, likely breast attenuation, nl EF, low risk; d. 05/2016 Event monitor: No significant arrhythmia; e. 05/2016 Event monitor: PAF  (4%).    Past Surgical History:  Procedure Laterality Date  . APPENDECTOMY    . EYE SURGERY     Glaucoma  . TOTAL ABDOMINAL HYSTERECTOMY      Current Medications: No outpatient medications have been marked as taking for the 03/17/19 encounter (Appointment) with Rise Mu, PA-C.    Allergies:   Other, Alphagan [brimonidine], Pravachol [pravastatin sodium], and Pravastatin   Social History   Socioeconomic History  . Marital status: Widowed    Spouse name: Not on file  . Number of children: 2  . Years of education: Not on file  . Highest education level: High school graduate  Occupational History  . Occupation: retired  Scientific laboratory technician  . Financial resource strain: Not hard at all  . Food insecurity    Worry: Never true    Inability: Never true  . Transportation needs    Medical: No    Non-medical: No  Tobacco Use  . Smoking status: Never Smoker  . Smokeless tobacco: Never Used  Substance and Sexual Activity  . Alcohol use: No  . Drug use: No  . Sexual activity: Not Currently  Lifestyle  . Physical activity    Days per week: 7 days    Minutes per session: 30 min  . Stress: Not at all  Relationships  . Social connections    Talks on phone: More than three  times a week    Gets together: More than three times a week    Attends religious service: More than 4 times per year    Active member of club or organization: No    Attends meetings of clubs or organizations: Never    Relationship status: Widowed  Other Topics Concern  . Not on file  Social History Narrative  . Not on file     Family History:  The patient's family history includes Glaucoma in her mother; Heart attack in her brother, father, and mother; Parkinson's disease in her brother.  ROS:   ROS   EKGs/Labs/Other Studies Reviewed:    Studies reviewed were summarized above. The additional studies were reviewed today:  2D echo 02/2019: 1. The left ventricle has normal systolic function with an  ejection fraction of 60-65%. The cavity size was normal. Left ventricular diastolic Doppler parameters are consistent with pseudonormalization.  2. The right ventricle has normal systolic function. The cavity was normal. There is no increase in right ventricular wall thickness.Unable to estimate RVSP  3. The aorta is normal unless otherwise noted. __________  Myoview 02/2019: Pharmacological myocardial perfusion imaging study with no significant  ischemia Normal wall motion, Unable to estimate EF secondary to GI uptake artifact No EKG changes concerning for ischemia at peak stress or in recovery. Low risk scan, echocardiogram with EF 60%   EKG:  EKG is ordered today.  The EKG ordered today demonstrates ***  Recent Labs: 01/10/2019: ALT 18 02/25/2019: TSH 3.349 02/26/2019: BUN 11; Creatinine, Ser 0.59; Hemoglobin 10.9; Platelets 162; Potassium 4.2; Sodium 137  Recent Lipid Panel    Component Value Date/Time   CHOL 174 07/02/2018 1040   CHOL 141 08/24/2015 0856   TRIG 173 (H) 07/02/2018 1040   HDL 57 07/02/2018 1040   HDL 52 08/24/2015 0856   CHOLHDL 3.1 07/02/2018 1040   VLDL 22 12/21/2015 0950   LDLCALC 90 07/02/2018 1040    PHYSICAL EXAM:    VS:  LMP  (LMP Unknown)   BMI: There is no height or weight on file to calculate BMI.  Physical Exam  Wt Readings from Last 3 Encounters:  02/26/19 136 lb 11.2 oz (62 kg)  02/06/19 136 lb (61.7 kg)  01/10/19 142 lb 8 oz (64.6 kg)     ASSESSMENT & PLAN:   1. ***  Disposition: F/u with Dr. Saunders Mcdaniel or an APP in ***.   Medication Adjustments/Labs and Tests Ordered: Current medicines are reviewed at length with the patient today.  Concerns regarding medicines are outlined above. Medication changes, Labs and Tests ordered today are summarized above and listed in the Patient Instructions accessible in Encounters.   Signed, Christell Faith, PA-C 03/14/2019 8:18 AM     [place] [address] [place] [address] [place], [place]  [address] [phone]

## 2019-03-17 ENCOUNTER — Ambulatory Visit: Payer: Medicare Other | Admitting: Physician Assistant

## 2019-03-17 ENCOUNTER — Encounter: Payer: Self-pay | Admitting: Cardiovascular Disease

## 2019-03-17 ENCOUNTER — Other Ambulatory Visit: Payer: Self-pay

## 2019-03-17 ENCOUNTER — Ambulatory Visit (INDEPENDENT_AMBULATORY_CARE_PROVIDER_SITE_OTHER): Payer: Medicare Other | Admitting: Cardiovascular Disease

## 2019-03-17 DIAGNOSIS — R55 Syncope and collapse: Secondary | ICD-10-CM

## 2019-03-17 DIAGNOSIS — I6523 Occlusion and stenosis of bilateral carotid arteries: Secondary | ICD-10-CM | POA: Diagnosis not present

## 2019-03-17 DIAGNOSIS — M79605 Pain in left leg: Secondary | ICD-10-CM | POA: Diagnosis not present

## 2019-03-17 DIAGNOSIS — M79604 Pain in right leg: Secondary | ICD-10-CM | POA: Diagnosis not present

## 2019-03-17 DIAGNOSIS — I10 Essential (primary) hypertension: Secondary | ICD-10-CM

## 2019-03-17 DIAGNOSIS — I5032 Chronic diastolic (congestive) heart failure: Secondary | ICD-10-CM | POA: Diagnosis not present

## 2019-03-17 DIAGNOSIS — E785 Hyperlipidemia, unspecified: Secondary | ICD-10-CM

## 2019-03-17 DIAGNOSIS — I480 Paroxysmal atrial fibrillation: Secondary | ICD-10-CM

## 2019-03-17 MED ORDER — RIVAROXABAN 20 MG PO TABS: 20.0000 mg | ORAL_TABLET | Freq: Every day | ORAL | Status: DC

## 2019-03-17 NOTE — Progress Notes (Signed)
Cardiology Office Note  Date:  03/17/2019   ID:  Sydney Mcdaniel, DOB [birth year], MRN [id number]  PCP:  Arnetha Courser, MD   Chief Complaint  Patient presents with  . Other    [name] c/o swelling in ankles. Meds reviewed verbally with patient.     HPI:  Sydney Mcdaniel is a 85 y.o. year-old female with history of  recurrent falls felt to be due to orthostatic hypotension and vasovagal syncope, paroxysmal atrial fibrillation,  HFpEF,  HTN, CLL, vitamin B12 deficiency, and anemia,  who presents for follow-up of syncope and atrial fibrillation.  Patient of Dr. Saunders Mcdaniel  Was seen by myself in the hospital February 26, 2019 for episode of chest pain  developed chest pain 2 hours after eating lunch, 4-5 over 10 in intensity, nonradiating, more to the right inner chest.  She felt flushed, diaphoretic, called her daughter.  Daughter was concerned that she was going to have a near syncope or syncope episode.  Per the daughter she felt clammy She arrived to the hospital by EMS Per the notes fire department reported pressure 202/70 That morning she had missed her lisinopril but had taken her amlodipine at lunch 1 PM  Pain felt to be atypical in nature with new left bundle branch block, She underwent Myoview showing no significant ischemia Pharmacological myocardial perfusion imaging study with no significant  ischemia Normal wall motion, Unable to estimate EF secondary to GI uptake artifact No EKG changes concerning for ischemia at peak stress or in recovery. Low risk scan, echocardiogram with EF 60%  BP running a little high, Typically runs high given hx of orthostasis   intermittent leg pain (L>R).  Not from a statin  left hip pain.    chronic swelling of the left leg, which is unchanged.    She denies chest pain, shortness of breath, palpitations, lightheadedness, and orthopnea.  Her weight has also been stable.  She ambulates with a cane and has not had any falls.    Creatinine  clearance 60 this month Last month aug 2020 was 64 July CC was 43  EKG personally reviewed by myself on todays visit NSr with rate 57 bpm   PMH:   has a past medical history of Allergy, Anemia, Cataract, CLL (chronic lymphocytic leukemia) ([place]), Glaucoma, H/O: hysterectomy, Hyperlipidemia, Hypertension, Hypothyroidism, Impaired fasting glucose, Lichen sclerosus, Osteoporosis, PAF (paroxysmal atrial fibrillation) ([place]), and Syncope.  PSH:    Past Surgical History:  Procedure Laterality Date  . APPENDECTOMY    . EYE SURGERY     Glaucoma  . TOTAL ABDOMINAL HYSTERECTOMY      Current Outpatient Medications  Medication Sig Dispense Refill  . ACCU-CHEK SOFTCLIX LANCETS lancets Use as instructed; check once a day; H40.1131 100 each 12  . alendronate (FOSAMAX) 70 MG tablet Take 1 tablet (70 mg total) by mouth every 7 (seven) days. Take with a full glass of water on an empty stomach. 4 tablet 11  . amLODipine (NORVASC) 2.5 MG tablet TAKE 1 TABLET BY MOUTH ONCE DAILY 90 tablet 2  . Calcium Carb-Cholecalciferol (CALCIUM 600 + D PO) Take 1 tablet by mouth daily.     . cholecalciferol (VITAMIN D) 1000 units tablet Take 1,000 Units by mouth daily.    . Cyanocobalamin (B-12) 1000 MCG/ML KIT Inject 1,000 mcg as directed every 30 (thirty) days.    . EUTHYROX 75 MCG tablet TAKE ONE TABLET BY MOUTH ON MONDAYS, WEDNESDAYS, FRIDAYS, AND SUNDAYS. 52 tablet 0  . famotidine (PEPCID) 20  MG tablet Take 1 tablet (20 mg total) by mouth at bedtime. 30 tablet 5  . glucose blood (ACCU-CHEK AVIVA PLUS) test strip Use as instructed, check once a day; H40.1131 100 each 3  . IRON PO Take 1 tablet by mouth 2 (two) times daily.     Marland Kitchen latanoprost (XALATAN) 0.005 % ophthalmic solution Place 1 drop into the right eye at bedtime.     Marland Kitchen levothyroxine (SYNTHROID) 88 MCG tablet TAKE ONE TABLET BY MOUTH ON TUESDAYS, THURSDAYS, AND SATURDAYS. 39 tablet 0  . lisinopril (PRINIVIL,ZESTRIL) 5 MG tablet Take 5 mg (1 tablet) by mouth  daily if Systolic BB>048. Take 2.5 mg (1/2 tablet) by mouth daily is Systolic BP> 889. 90 tablet 2  . Multiple Vitamins-Minerals (MULTIVITAMIN ADULT PO) Take by mouth.    . Omega-3 Fatty Acids (FISH OIL) 1200 MG CAPS Take by mouth daily.    . timolol (TIMOPTIC) 0.5 % ophthalmic solution Place 1 drop into the right eye 2 (two) times daily.     Marland Kitchen atorvastatin (LIPITOR) 20 MG tablet Take 1 tablet (20 mg total) by mouth daily. 90 tablet 3  . rivaroxaban (XARELTO) 20 MG TABS tablet Take 1 tablet (20 mg total) by mouth daily with supper.     No current facility-administered medications for this visit.      Allergies:   Other, Alphagan [brimonidine], Pravachol [pravastatin sodium], and Pravastatin   Social History:  The patient  reports that she has never smoked. She has never used smokeless tobacco. She reports that she does not drink alcohol or use drugs.   Family History:   family history includes Glaucoma in her mother; Heart attack in her brother, father, and mother; Parkinson's disease in her brother.    Review of Systems: Review of Systems  Constitutional: Negative.   HENT: Negative.   Respiratory: Negative.   Cardiovascular: Positive for leg swelling.  Gastrointestinal: Negative.   Musculoskeletal: Negative.   Neurological: Negative.   Psychiatric/Behavioral: Negative.   All other systems reviewed and are negative.   PHYSICAL EXAM: VS:  BP (!) 148/60 (BP Location: Left Arm, Patient Position: Sitting, Cuff Size: Normal)   Pulse (!) 57   Ht '5\' 2"'  (1.575 m)   Wt 141 lb (64 kg)   LMP  (LMP Unknown)   BMI 25.79 kg/m  , BMI Body mass index is 25.79 kg/m. GEN: Well nourished, well developed, in no acute distress HEENT: normal Neck: no JVD, carotid bruits, or masses Cardiac: RRR; no murmurs, rubs, or gallops,no edema  Respiratory:  clear to auscultation bilaterally, normal work of breathing GI: soft, nontender, nondistended, + BS MS: no deformity or atrophy Skin: warm and dry,  no rash Neuro:  Strength and sensation are intact Psych: euthymic mood, full affect   Recent Labs: 01/10/2019: ALT 18 02/25/2019: TSH 3.349 02/26/2019: BUN 11; Creatinine, Ser 0.59; Hemoglobin 10.9; Platelets 162; Potassium 4.2; Sodium 137    Lipid Panel Lab Results  Component Value Date   CHOL 174 07/02/2018   HDL 57 07/02/2018   LDLCALC 90 07/02/2018   TRIG 173 (H) 07/02/2018      Wt Readings from Last 3 Encounters:  03/17/19 141 lb (64 kg)  02/26/19 136 lb 11.2 oz (62 kg)  02/06/19 136 lb (61.7 kg)       ASSESSMENT AND PLAN:  Problem List Items Addressed This Visit      Cardiology Problems   Essential hypertension   Relevant Medications   rivaroxaban (XARELTO) 20 MG TABS tablet  PAF (paroxysmal atrial fibrillation) (HCC) - Primary   Relevant Medications   rivaroxaban (XARELTO) 20 MG TABS tablet   Bilateral carotid artery stenosis   Relevant Medications   rivaroxaban (XARELTO) 20 MG TABS tablet    Other Visit Diagnoses    Chronic heart failure with preserved ejection fraction (HCC)       Relevant Medications   rivaroxaban (XARELTO) 20 MG TABS tablet   Hyperlipidemia, unspecified hyperlipidemia type       Relevant Medications   rivaroxaban (XARELTO) 20 MG TABS tablet   Vasovagal near syncope       Pain in both lower extremities         Atrial fib Maintaining NSR xarelto 20 mg based on creatinine clearance of 60 Creatinine was elevated July 2020 and pharmacy in the hospital had decreased her Xarelto down to 15 daily Recommend she finish up her 2 boxes of Xarelto 20 When she goes for refill would contact our office, may need to look closely at her weight and creatinine before deciding on the appropriate dose  Hypertension Mildly elevated but walked long distance into the hospital today No changes to her medications given history of near syncope  Chest pain Recent work-up in the hospital, negative stress test No further episodes No further work-up at  this time   Disposition:   F/U  12 months   Total encounter time more than 25 minutes  Greater than 50% was spent in counseling and coordination of care with the patient    Signed, Esmond Plants, M.D., Ph.D. [place], [place]

## 2019-03-17 NOTE — Patient Instructions (Addendum)
Please call in 6 weeks to refill xarelto at the right dose  Medication Instructions:  - Your physician has recommended you make the following change in your medication:   1) Increase xarelto to 20 mg- take 1 tablet by mouth daily with supper   If you need a refill on your cardiac medications before your next appointment, please call your pharmacy.    Lab work: No new labs needed   If you have labs (blood work) drawn today and your tests are completely normal, you will receive your results only by: Marland Kitchen MyChart Message (if you have MyChart) OR . A paper copy in the mail If you have any lab test that is abnormal or we need to change your treatment, we will call you to review the results.   Testing/Procedures: No new testing needed   Follow-Up: At [facility], you and your health needs are our priority.  As part of our continuing mission to provide you with exceptional heart care, we have created designated Provider Care Teams.  These Care Teams include your primary Cardiologist (physician) and Advanced Practice Providers (APPs -  Physician Assistants and Nurse Practitioners) who all work together to provide you with the care you need, when you need it.  . You will need a follow up appointment in 6 months with Dr. Saunders Revel   . Providers on your designated Care Team:   . [name], NP . Christell Faith, PA-C . Marrianne Mood, PA-C  Any Other Special Instructions Will Be Listed Below (If Applicable).  For educational health videos Log in to : [email or web address] Or : [email or web address], password : [email or web address]

## 2019-03-18 ENCOUNTER — Telehealth: Payer: Self-pay | Admitting: Family Medicine

## 2019-03-18 DIAGNOSIS — Z23 Encounter for immunization: Secondary | ICD-10-CM | POA: Diagnosis not present

## 2019-03-18 NOTE — Telephone Encounter (Signed)
[facility] from [facility] called to stating he had an in home visit with patient Sydney Mcdaniel states patient BP was high. First reading 202/72 Waited 6 mins 204/68 Waited 11 mins (last reading) 188/58  Tommy call back  [phone]

## 2019-03-18 NOTE — Telephone Encounter (Signed)
Pt called feeling fine, states just went to Cardiologist yesterday bp was good, and no changes in meds.  They are out right right now but will check when they get home with her machine.  Nurse from home health comes out tomorrow and will re check again and let us know.

## 2019-03-18 NOTE — Telephone Encounter (Signed)
Patient's daughter called in with BP reading, 163/56 and HR was 66. Please advise.

## 2019-03-19 NOTE — Addendum Note (Signed)
Addended by: Janan Ridge on: 03/19/2019 08:38 AM   Modules accepted: Orders

## 2019-03-19 NOTE — Telephone Encounter (Signed)
Pt daughter notified, if continues to be high or has symptoms to follow-up with cardio, since they are the ones prescribing and adjusting meds

## 2019-03-20 ENCOUNTER — Telehealth: Payer: Self-pay | Admitting: Internal Medicine

## 2019-03-20 DIAGNOSIS — I10 Essential (primary) hypertension: Secondary | ICD-10-CM

## 2019-03-20 MED ORDER — LISINOPRIL 5 MG PO TABS: 7.5000 mg | ORAL_TABLET | Freq: Every day | ORAL | 2 refills | Status: DC

## 2019-03-20 NOTE — Telephone Encounter (Signed)
BP this morning was 152/47 took half of lisinopril 5 mg @8 :10 am and took the other half @ 11:20 am PT took BP 218/84 & 194/66 PT wasn't done because SBP was over 180 Denies dizziness, chest pain or sob, headache, blurred vision or fatigue BP rechecked while on phone with patient 201/65 Per daughter patient is still drinking gatorade daily (4oz) per day due to low sodium in the past Denies adding salt to food

## 2019-03-20 NOTE — Telephone Encounter (Signed)
Please have Ms. Cure take lisinopril 7.5 mg daily and come in for a BMP in 1 week.  Thanks.  Nelva Bush, MD [facility] Pager: [phone]

## 2019-03-20 NOTE — Telephone Encounter (Signed)
Patient's daughter informed and verbalized understanding of plan. Lab order sent to [facility].

## 2019-03-20 NOTE — Telephone Encounter (Signed)
PTA with [facility] calling in regarding patients high BP for the past 3 days Pt c/o BP issue: STAT if pt c/o blurred vision, one-sided weakness or slurred speech  1. What are your last 5 BP readings?  All within the last 20-30 minutes 218/84 194/66 (different cuff) 19360 (patients device)  2. Are you having any other symptoms (ex. Dizziness, headache, blurred vision, passed out)? asymptomatic and states she feels fine  3. What is your BP issue? Abnormal. Patient has also taken her lisinopril (whole tablet)   Please advise patients daughter [name] at [phone]

## 2019-03-26 ENCOUNTER — Telehealth: Payer: Self-pay | Admitting: Internal Medicine

## 2019-03-26 NOTE — Telephone Encounter (Signed)
BP readings from [place], a PT with [facility].  Consistent diastolic readings in mid 123XX123 for last 5 days. Resting HR has been around 50 bpm with occasional near syncope.   Please advise daughter, [name], at [phone]  Therapist also recommends patient come in sooner than a 6 month follow up depending on doctors advice.

## 2019-03-26 NOTE — Telephone Encounter (Signed)
BP readings from Sydney Mcdaniel, a PT with [facility].  Consistent diastolic readings in mid 123XX123 for last 5 days. Resting HR has been around 50 bpm with occasional near syncope.   Please advise daughter, [name], at [phone]  Therapist also recommends patient come in sooner than a 6 month follow up depending on doctors advice.

## 2019-03-26 NOTE — Telephone Encounter (Signed)
Patients daughter returning call, says she will be available when you call back

## 2019-03-26 NOTE — Telephone Encounter (Signed)
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  I have recommended multiple times that she speak with her eye doctor about stopping timolol, which can cause bradycardia.  Despite this recommendation, she remains on the medication.  We could consider placing a monitor to assess her heart rate and need for pacemaker, but EP would like want her to come off timolol before comiting her to a permanent pacemaker.  If she is interested, we can arrange for a 14 day ZIO patch and then have her f/u with me or an APP.  Nelva Bush, MD [facility] Pager: [phone]

## 2019-03-26 NOTE — Telephone Encounter (Signed)
No answer on daughter's number. Left message to call back to discuss.

## 2019-03-26 NOTE — Telephone Encounter (Signed)
Returned call to patient's daughter, ok per DPR. She was not with patient what PT was there today. Patient is not having lightheadedness, chest pain or shortness of breath or palpitations at this time. On Monday, 9/28, patient did have a dizzy spell where she felt dizzy. She was in the kitchen and was able to balance herself with out fainting or falling.  In looking through patient's chart, it appears her HR is known to be in the 50's at times.  Recent BP/HR readings are as follows: 9/27 154/44,  49  After AM meds 150/42,  48 9/28 154/49,  50  After AM meds 147/46,  49 9/29 160/46,  51  After AM meds 153/48,  58 9/30 170/50,  52  After AM meds 144/46  Patient denies having the dizzy spells everyday. She says she stays well hydrated and sometimes wears compression socks. Advised daughter I would have Dr End review and let her know any further recommendations.

## 2019-03-26 NOTE — Telephone Encounter (Signed)
Called and spoke with daughter. She verbalized understanding and stated back Dr Darnelle Bos recommendations. She saw the reasoning that finding alternative medication for Timolol may be a good first step in the process. She will see what patient wants to do and get back to [place] about plan of care.

## 2019-03-27 ENCOUNTER — Encounter: Payer: Self-pay | Admitting: Internal Medicine

## 2019-03-27 DIAGNOSIS — I110 Hypertensive heart disease with heart failure: Secondary | ICD-10-CM | POA: Diagnosis not present

## 2019-03-28 NOTE — Telephone Encounter (Signed)
Call to patient to discuss HTN. Spoke with daughter [name], okay per DPR.   Seen by PT yesterday, could not do therapy r/t HTN. PT checked with their machine and pt machine and numbers correlated.   0630 BP 160/46, HR 61 Took 7.5 mg lisinopril at 7:30 A BP at that time was 146/43.  Laid down around 9:30 A when BP was 184/54 HR 62 10:45 BP 214/65 after sitting for 5 min.   BP while on the phone with me 187/62, HR 65. Denies HA, blurred vision. No SOB, chest pain or dizziness. "feels normal and doing normal activities".  I spoke to Dr. Garen Lah (DOD) he suggested she take afternoon amlodipine now.   I suggested that she is likely getting anxious bc it is high and I suggested taking 2 hrs after med then waiting until tonight before taking again, unless she has sx as dicussed above.   No other medication changes at this time. I suggested that going forward she limit how many times she is taking per day unless she is having sx or high BP > 0000000 systolic. I discussed ways to relax and how it contributes to lowering blood pressure.   Return call to clinic if BP is elevated after medication and relaxation techniques or if she has any new sx.  Encouraged her to seek urgent medical care if she has new or worsening sx over the weekend.   She will call opthalmology back today. Daughter agreed to call on Monday and give update.

## 2019-03-28 NOTE — Telephone Encounter (Signed)
Patient daughter [name] calling Patient is still having high BP 10/1 7:30a - 175/55 10/1 - 148/60  10/2 6:50a - 160/46 Took lisinopril  10/2 7:30a - 146/43 10/2 9:30a - 184/54 Laid down  10/2 10:45a - 214/65  Patient daughter [name] would like to know if she should alter some medications before going into the weekend Please call ASAP

## 2019-03-31 ENCOUNTER — Telehealth: Payer: Self-pay | Admitting: Internal Medicine

## 2019-03-31 ENCOUNTER — Other Ambulatory Visit: Payer: Self-pay

## 2019-03-31 ENCOUNTER — Inpatient Hospital Stay: Payer: Medicare Other | Attending: Hematology and Oncology

## 2019-03-31 VITALS — BP 178/60 | HR 70

## 2019-03-31 DIAGNOSIS — H269 Unspecified cataract: Secondary | ICD-10-CM | POA: Diagnosis not present

## 2019-03-31 DIAGNOSIS — Z7901 Long term (current) use of anticoagulants: Secondary | ICD-10-CM | POA: Diagnosis not present

## 2019-03-31 DIAGNOSIS — E785 Hyperlipidemia, unspecified: Secondary | ICD-10-CM | POA: Diagnosis not present

## 2019-03-31 DIAGNOSIS — E538 Deficiency of other specified B group vitamins: Secondary | ICD-10-CM

## 2019-03-31 DIAGNOSIS — I951 Orthostatic hypotension: Secondary | ICD-10-CM | POA: Diagnosis not present

## 2019-03-31 DIAGNOSIS — I447 Left bundle-branch block, unspecified: Secondary | ICD-10-CM | POA: Diagnosis not present

## 2019-03-31 DIAGNOSIS — H409 Unspecified glaucoma: Secondary | ICD-10-CM | POA: Diagnosis not present

## 2019-03-31 DIAGNOSIS — I480 Paroxysmal atrial fibrillation: Secondary | ICD-10-CM | POA: Diagnosis not present

## 2019-03-31 DIAGNOSIS — I110 Hypertensive heart disease with heart failure: Secondary | ICD-10-CM | POA: Diagnosis not present

## 2019-03-31 DIAGNOSIS — E039 Hypothyroidism, unspecified: Secondary | ICD-10-CM | POA: Diagnosis not present

## 2019-03-31 DIAGNOSIS — I5032 Chronic diastolic (congestive) heart failure: Secondary | ICD-10-CM | POA: Diagnosis not present

## 2019-03-31 DIAGNOSIS — D649 Anemia, unspecified: Secondary | ICD-10-CM | POA: Diagnosis not present

## 2019-03-31 DIAGNOSIS — C9110 Chronic lymphocytic leukemia of B-cell type not having achieved remission: Secondary | ICD-10-CM | POA: Diagnosis not present

## 2019-03-31 DIAGNOSIS — R0789 Other chest pain: Secondary | ICD-10-CM | POA: Diagnosis not present

## 2019-03-31 DIAGNOSIS — Z9181 History of falling: Secondary | ICD-10-CM | POA: Diagnosis not present

## 2019-03-31 DIAGNOSIS — R55 Syncope and collapse: Secondary | ICD-10-CM | POA: Diagnosis not present

## 2019-03-31 DIAGNOSIS — M810 Age-related osteoporosis without current pathological fracture: Secondary | ICD-10-CM | POA: Diagnosis not present

## 2019-03-31 DIAGNOSIS — L900 Lichen sclerosus et atrophicus: Secondary | ICD-10-CM | POA: Diagnosis not present

## 2019-03-31 DIAGNOSIS — R7301 Impaired fasting glucose: Secondary | ICD-10-CM | POA: Diagnosis not present

## 2019-03-31 MED ORDER — CYANOCOBALAMIN 1000 MCG/ML IJ SOLN
1000.0000 ug | Freq: Once | INTRAMUSCULAR | Status: AC
  Administered 2019-03-31: 1000 ug via INTRAMUSCULAR
  Filled 2019-03-31: qty 1

## 2019-03-31 NOTE — Telephone Encounter (Signed)
This has been handled through [employer]. Closing this encounter.

## 2019-03-31 NOTE — Telephone Encounter (Signed)
Patient daughter calling Wanted to make office aware that BP readings were sent as a picture on mychart Please review and call to discuss at [phone]

## 2019-03-31 NOTE — Telephone Encounter (Signed)
Spoke with daughter and she states pt has been taking Lisinopril 7.5mg  since instructed. HH came last Thursday and drew BMET.  Daughter states they haven't heard back on results.  Daughter is going to call [facility] agency and tell them to send [place] those results ASAP.  Scheduled pt to see Dr. Saunders Revel next Monday to f/u on blood pressure.

## 2019-04-01 NOTE — Telephone Encounter (Signed)
Returned call to [name] to check in. She reported that pt still not having sx of HA, dizziness or chest pain.   BP around 0000000 systolic. Appt made for next Monday, she will bring all VS data to visit.   Pt is just taking 2 times daily, unless she is having sx which she denies.   Labs being faxed from home health, I let [name] know that we have not received at this time.   Advised pt to call for any further questions or concerns.

## 2019-04-07 ENCOUNTER — Other Ambulatory Visit: Payer: Self-pay

## 2019-04-07 ENCOUNTER — Ambulatory Visit: Payer: Medicare Other | Admitting: Internal Medicine

## 2019-04-07 ENCOUNTER — Encounter: Payer: Self-pay | Admitting: Internal Medicine

## 2019-04-07 ENCOUNTER — Ambulatory Visit (INDEPENDENT_AMBULATORY_CARE_PROVIDER_SITE_OTHER): Payer: Medicare Other | Admitting: Internal Medicine

## 2019-04-07 ENCOUNTER — Telehealth: Payer: Self-pay | Admitting: *Deleted

## 2019-04-07 VITALS — BP 188/64 | HR 69 | Ht 62.0 in | Wt 141.5 lb

## 2019-04-07 DIAGNOSIS — I10 Essential (primary) hypertension: Secondary | ICD-10-CM | POA: Diagnosis not present

## 2019-04-07 DIAGNOSIS — I480 Paroxysmal atrial fibrillation: Secondary | ICD-10-CM | POA: Diagnosis not present

## 2019-04-07 DIAGNOSIS — I440 Atrioventricular block, first degree: Secondary | ICD-10-CM

## 2019-04-07 DIAGNOSIS — E785 Hyperlipidemia, unspecified: Secondary | ICD-10-CM | POA: Diagnosis not present

## 2019-04-07 DIAGNOSIS — R001 Bradycardia, unspecified: Secondary | ICD-10-CM

## 2019-04-07 MED ORDER — LISINOPRIL 10 MG PO TABS: 10.0000 mg | ORAL_TABLET | Freq: Every day | ORAL | 1 refills | Status: DC

## 2019-04-07 NOTE — Progress Notes (Signed)
Follow-up Outpatient Visit Date: 04/07/2019  Primary Care Provider: [facility], [place] [address]  Chief Complaint: Elevated blood pressure  HPI:  [name] is a 85 y.o. year-old female with history of  recurrent falls felt to be due to orthostatic hypotension and vasovagal syncope,paroxysmal atrial fibrillation, HFpEF, HTN, CLL, vitamin B12 deficiency, and anemia, who presents for follow-up of hypertension.  She was last seen in our office by Dr. Rockey Situ on 03/17/2019 utilization in early September for chest pain.  Pain was felt to be atypical but EKG showed new LBBB.  Myocardial perfusion stress test was low risk without ischemia.  Blood pressure was mildly elevated at that time but no medication changes were made to her antihypertensive regimen.  Rivaroxaban was increased to 20 mg daily based on GFR.  Since that time, [name] and her family have contacted [place] several times about elevated blood pressure readings.  This has led [place] to escalate lisinopril to 7.5 mg daily.  Today, [name] reports that she is feeling well.  She recently conferred with her eye doctor and discontinued timolol.  Resting heart rates have increased and are no longer in the 40s.  Her blood pressures have remained elevated despite increasing lisinopril to 7.5 mg daily.  Her daughter wonders if stress/anxiety associated with frequent blood pressure checking could be contributing to her elevated readings.  [name] denies chest pain, shortness of breath, palpitations, lightheadedness, and edema.  She has not had any significant bleeding or other medication side effects.  --------------------------------------------------------------------------------------------------  Cardiovascular History & Procedures: Cardiovascular Problems:  Recurrent syncope  Paroxysmal atrial fibrillation  HFpEF  Risk Factors:  Hypertension and age greater than 66  Cath/PCI:  None   CV Surgery:  None  EP Procedures and Devices:  Event monitor (06/15/16): Dominantly sinus rhythm with paroxysmal atrial fibrillation (4% burden). No significant pauses.  Event monitor(06/02/16): Normal sinus rhythm with first-degree AV block. No significant arrhythmia.  Holter monitor (04/17/16): Predominantly sinus rhythm with first-degree AV block. PACs and short atrial runs lasting up to 4 beats noted. Rare PVCs.  Non-Invasive Evaluation(s):  Pharmacologic MPI (02/26/19): Low risk study without ischemia.  Unable to assess LVEF secondary to GI uptake.  TTE (02/26/19): Normal LV size and wall thickness.  LVEF 60-65% with grade 2 diastolic dysfunction.  Normal RV size and function.  Unable to assess RVSP.  No significant valvular abnormality.  Carotid Doppler (05/02/18): Mild right ICA stenosis (1-39%).  Moderate left ICA stenosis (40-59%).  Carotid Doppler (04/30/17): Moderate (40-59%) proximal ICA stenoses bilaterally. Greater than 50% left external carotid artery stenosis. No significant change from prior study in 04/2016.  TTE (04/17/16): Normal LV size. LVEF 55-60%. Normal wall motion and diastolic function. Mild aortic and mitral regurgitation. Normal RV size and function. Normal pulmonary artery pressure.  Pharmacologic MPI (04/10/16): Low risk study with small fixed defect at the apex, likely due to breast attenuation. No ischemia. LVEF 55-65%.  Recent CV Pertinent Labs: Lab Results  Component Value Date   CHOL 174 07/02/2018   CHOL 141 08/24/2015   HDL 57 07/02/2018   HDL 52 08/24/2015   LDLCALC 90 07/02/2018   TRIG 173 (H) 07/02/2018   CHOLHDL 3.1 07/02/2018   K 4.2 02/26/2019   MG 2.2 12/21/2015   BUN 11 02/26/2019   BUN 14 03/12/2018   CREATININE 0.59 02/26/2019   CREATININE 0.81 01/27/2019    Past medical and surgical history were reviewed and updated in EPIC.  Current Meds  Medication Sig  . ACCU-CHEK SOFTCLIX LANCETS lancets Use as instructed;  check once a day; H40.1131  . alendronate (FOSAMAX) 70 MG tablet Take 1 tablet (70 mg total) by mouth every 7 (seven) days. Take with a full glass of water on an empty stomach.  . amLODipine (NORVASC) 2.5 MG tablet TAKE 1 TABLET BY MOUTH ONCE DAILY  . atorvastatin (LIPITOR) 20 MG tablet Take 1 tablet (20 mg total) by mouth daily.  . Calcium Carb-Cholecalciferol (CALCIUM 600 + D PO) Take 1 tablet by mouth daily.   . cholecalciferol (VITAMIN D) 1000 units tablet Take 1,000 Units by mouth daily.  . Cyanocobalamin (B-12) 1000 MCG/ML KIT Inject 1,000 mcg as directed every 30 (thirty) days.  . EUTHYROX 75 MCG tablet TAKE ONE TABLET BY MOUTH ON MONDAYS, WEDNESDAYS, FRIDAYS, AND SUNDAYS.  . famotidine (PEPCID) 20 MG tablet Take 1 tablet (20 mg total) by mouth at bedtime.  . glucose blood (ACCU-CHEK AVIVA PLUS) test strip Use as instructed, check once a day; H40.1131  . IRON PO Take 1 tablet by mouth 2 (two) times daily.   . latanoprost (XALATAN) 0.005 % ophthalmic solution Place 1 drop into the right eye at bedtime.   . levothyroxine (SYNTHROID) 88 MCG tablet TAKE ONE TABLET BY MOUTH ON TUESDAYS, THURSDAYS, AND SATURDAYS.  . Multiple Vitamins-Minerals (MULTIVITAMIN ADULT PO) Take by mouth.  . Omega-3 Fatty Acids (FISH OIL) 1200 MG CAPS Take by mouth daily.  . rivaroxaban (XARELTO) 20 MG TABS tablet Take 1 tablet (20 mg total) by mouth daily with supper.  . [DISCONTINUED] lisinopril (ZESTRIL) 5 MG tablet Take 1.5 tablets (7.5 mg total) by mouth daily.    Allergies: Other, Alphagan [brimonidine], Pravachol [pravastatin sodium], and Pravastatin  Social History   Tobacco Use  . Smoking status: Never Smoker  . Smokeless tobacco: Never Used  Substance Use Topics  . Alcohol use: No  . Drug use: No    Family History  Problem Relation Age of Onset  . Heart attack Mother   . Glaucoma Mother   . Heart attack Father   . Parkinson's disease Brother   . Heart attack Brother     Review of Systems:  A 12-system review of systems was performed and was negative except as noted in the HPI.  --------------------------------------------------------------------------------------------------  Physical Exam: BP (!) 188/64 (BP Location: Left Arm, Patient Position: Sitting, Cuff Size: Normal)   Pulse 69   Ht 5' 2" (1.575 m)   Wt 141 lb 8 oz (64.2 kg)   LMP  (LMP Unknown)   SpO2 98%   BMI 25.88 kg/m   General: NAD.  Accompanied by her daughter. HEENT: No conjunctival pallor or scleral icterus.  Facemask in place. Neck: Supple without lymphadenopathy, thyromegaly, JVD, or HJR. No carotid bruit. Lungs: Normal work of breathing. Clear to auscultation bilaterally without wheezes or crackles. Heart: Regular rate and rhythm without murmurs, rubs, or gallops. Non-displaced PMI. Abd: Bowel sounds present. Soft, NT/ND without hepatosplenomegaly Ext: No lower extremity edema. Skin: Warm and dry without rash.  EKG: Normal sinus rhythm with first-degree AV block.  Otherwise, no significant abnormality.  Lab Results  Component Value Date   WBC 6.1 02/26/2019   HGB 10.9 (L) 02/26/2019   HCT 32.7 (L) 02/26/2019   MCV 92.9 02/26/2019   PLT 162 02/26/2019    Lab Results  Component Value Date   NA 137 02/26/2019   K 4.2 02/26/2019   CL 103 02/26/2019   CO2 26 02/26/2019     BUN 11 02/26/2019   CREATININE 0.59 02/26/2019   GLUCOSE 120 (H) 02/26/2019   ALT 18 01/10/2019    Lab Results  Component Value Date   CHOL 174 07/02/2018   HDL 57 07/02/2018   LDLCALC 90 07/02/2018   TRIG 173 (H) 07/02/2018   CHOLHDL 3.1 07/02/2018    --------------------------------------------------------------------------------------------------  ASSESSMENT AND PLAN: Hypertension: Blood pressures remain labile and are quite elevated today.  We will increase lisinopril to 10 mg daily with continued monitoring at home (albeit less frequent to minimize component of anxiety driving hypertension).  We will recheck  a basic metabolic panel in about 2 weeks.  Amlodipine 2.5 mg daily will also be continued.  Paroxysmal atrial fibrillation: Ms. Iwasaki remains in sinus rhythm today and has not had any symptoms to suggest recurrent atrial fibrillation.  Continue rivaroxaban 20 mg daily.  Sinus bradycardia and first-degree AV block: Heart rate normal today, improved since recent discontinuation of timolol.  No further intervention at this time.  Avoidance of AV nodal blocking agents is recommended.  Hyperlipidemia: Continue atorvastatin and ongoing management per Ms. Boyce.  Follow-up: Return to clinic in 3 months.   , MD 04/09/2019 6:43 AM  

## 2019-04-07 NOTE — Patient Instructions (Signed)
Medication Instructions:  Your physician has recommended you make the following change in your medication:  1- INCREASE Lisinopril to 10 mg by mouth once a day.  If you need a refill on your cardiac medications before your next appointment, please call your pharmacy.   Lab work: Your physician recommends that you return for lab work in: [place]. BMET. AROUND 04/21/19.  If you have labs (blood work) drawn today and your tests are completely normal, you will receive your results only by: Marland Kitchen MyChart Message (if you have MyChart) OR . A paper copy in the mail If you have any lab test that is abnormal or we need to change your treatment, we will call you to review the results.  Testing/Procedures: NONE  Follow-Up: At [facility], you and your health needs are our priority.  As part of our continuing mission to provide you with exceptional heart care, we have created designated Provider Care Teams.  These Care Teams include your primary Cardiologist (physician) and Advanced Practice Providers (APPs -  Physician Assistants and Nurse Practitioners) who all work together to provide you with the care you need, when you need it. You will need a follow up appointment in 3 months.   Please call our office 2 months in advance to schedule this appointment.  You may see Nelva Bush, MD or one of the following Advanced Practice Providers on your designated Care Team:   [name], NP Christell Faith, PA-C . Marrianne Mood, PA-C

## 2019-04-07 NOTE — Telephone Encounter (Signed)
Patient seen in clinic today. Needs BMET in 2 weeks per Dr End. Patient is with Silvana at this time. Called [facility] and spoke with nurse.  Gave verbal order for BMET to be drawn and faxed to [place]. Fax number provided.

## 2019-04-09 ENCOUNTER — Encounter: Payer: Self-pay | Admitting: Internal Medicine

## 2019-04-09 DIAGNOSIS — I440 Atrioventricular block, first degree: Secondary | ICD-10-CM | POA: Insufficient documentation

## 2019-04-09 DIAGNOSIS — R001 Bradycardia, unspecified: Secondary | ICD-10-CM | POA: Insufficient documentation

## 2019-04-25 DIAGNOSIS — I110 Hypertensive heart disease with heart failure: Secondary | ICD-10-CM | POA: Diagnosis not present

## 2019-04-28 ENCOUNTER — Other Ambulatory Visit: Payer: Self-pay

## 2019-04-28 ENCOUNTER — Inpatient Hospital Stay: Payer: Medicare Other | Attending: Hematology and Oncology

## 2019-04-28 VITALS — BP 189/68 | HR 59 | Temp 97.4°F | Resp 16

## 2019-04-28 DIAGNOSIS — I951 Orthostatic hypotension: Secondary | ICD-10-CM

## 2019-04-28 DIAGNOSIS — E538 Deficiency of other specified B group vitamins: Secondary | ICD-10-CM | POA: Diagnosis not present

## 2019-04-28 DIAGNOSIS — I10 Essential (primary) hypertension: Secondary | ICD-10-CM

## 2019-04-28 DIAGNOSIS — I480 Paroxysmal atrial fibrillation: Secondary | ICD-10-CM

## 2019-04-28 MED ORDER — CYANOCOBALAMIN 1000 MCG/ML IJ SOLN
1000.0000 ug | Freq: Once | INTRAMUSCULAR | Status: AC
  Administered 2019-04-28: 1000 ug via INTRAMUSCULAR

## 2019-04-28 NOTE — Patient Instructions (Signed)

## 2019-04-28 NOTE — Progress Notes (Signed)
Spoke with patient's daughter and explained the elevated blood pressure readings.  Patient's daughter is to call the primary doctor and discuss the elevated blood pressure.  The medications had been changed due to the patient passing out.  Patient verbalized understanding and will follow up with ordering physician.  No complaints of headache or vision changes.  B12 given as ordered.

## 2019-04-29 MED ORDER — AMLODIPINE BESYLATE 5 MG PO TABS: 5.0000 mg | ORAL_TABLET | Freq: Every day | ORAL | 1 refills | Status: DC

## 2019-05-12 NOTE — Telephone Encounter (Signed)
Spoke with rep at [facility]. Patient was discharged from [facility] on 04/25/19 and they did get lab work that day as well. Originally faxed to PCP and Dr End.  We have not received lab work at this time. Requested to refax to [phone]. She will do and was very helpful.

## 2019-05-13 ENCOUNTER — Ambulatory Visit (INDEPENDENT_AMBULATORY_CARE_PROVIDER_SITE_OTHER): Payer: Medicare Other | Admitting: Family Medicine

## 2019-05-13 ENCOUNTER — Ambulatory Visit: Payer: Medicare Other | Admitting: Family Medicine

## 2019-05-13 ENCOUNTER — Encounter: Payer: Self-pay | Admitting: Family Medicine

## 2019-05-13 ENCOUNTER — Other Ambulatory Visit: Payer: Self-pay

## 2019-05-13 VITALS — BP 124/82 | HR 71 | Temp 97.3°F | Resp 14 | Ht 62.0 in | Wt 143.9 lb

## 2019-05-13 DIAGNOSIS — I5032 Chronic diastolic (congestive) heart failure: Secondary | ICD-10-CM | POA: Diagnosis not present

## 2019-05-13 DIAGNOSIS — H401132 Primary open-angle glaucoma, bilateral, moderate stage: Secondary | ICD-10-CM | POA: Diagnosis not present

## 2019-05-13 DIAGNOSIS — I480 Paroxysmal atrial fibrillation: Secondary | ICD-10-CM | POA: Diagnosis not present

## 2019-05-13 DIAGNOSIS — E039 Hypothyroidism, unspecified: Secondary | ICD-10-CM

## 2019-05-13 DIAGNOSIS — E782 Mixed hyperlipidemia: Secondary | ICD-10-CM | POA: Diagnosis not present

## 2019-05-13 DIAGNOSIS — I440 Atrioventricular block, first degree: Secondary | ICD-10-CM

## 2019-05-13 DIAGNOSIS — M810 Age-related osteoporosis without current pathological fracture: Secondary | ICD-10-CM

## 2019-05-13 DIAGNOSIS — I10 Essential (primary) hypertension: Secondary | ICD-10-CM

## 2019-05-13 DIAGNOSIS — E538 Deficiency of other specified B group vitamins: Secondary | ICD-10-CM | POA: Diagnosis not present

## 2019-05-13 DIAGNOSIS — C9110 Chronic lymphocytic leukemia of B-cell type not having achieved remission: Secondary | ICD-10-CM

## 2019-05-13 DIAGNOSIS — D649 Anemia, unspecified: Secondary | ICD-10-CM | POA: Diagnosis not present

## 2019-05-13 DIAGNOSIS — E119 Type 2 diabetes mellitus without complications: Secondary | ICD-10-CM | POA: Diagnosis not present

## 2019-05-13 NOTE — Progress Notes (Signed)
Name: Sydney Mcdaniel   MRN: [id number]    DOB: [birth year]   Date:05/13/2019       Progress Note  Subjective  Chief Complaint  Chief Complaint  Patient presents with  . Follow-up    4 month recheck    HPI  Pt presents with her daughter [name]) for follow up:  HTN: She has been doing well since September hospital follow up.  She denies any chest tightness/pain, no near syncope/syncope episodes, lightheadedness, BLE edema, or shortness of breath.  Her BP is at goal today.  She is seeing Dr. Saunders Revel with cardiology for BP management - taking lisinopril 44m, amlodipine 514mdaily and doing well on these doses.   PAF: Patient currently in normal sinus rhythm.  Continuing Xarelto for stroke prevention.  No signs or symptoms of bleeding - no blood in stool, dark and tarry stools, blood in urine, or nose bleed.  Does bruise easily.  The current dosing was adjusted for creatine clearance as per pharamcy to 15 mg daily during her hospital stay in September 2020. No palpitations; HR is well controlled but not bradycardic today.  Seeing Dr. EnSaunders Reveln February for follow up.  Chronic heart failure with preserved ejection fraction without signs of exacerbation: Denies shortness of breath.  Does get intermittent BLE, tried compression stockings but these caused too much discomfort.  Seeing Dr. EnSaunders RevelBP is well controlled; off of beta blocker due to hx bradycardia.  She is not on diuretic therapy at this time.   Hypothyroidism: Synthroid 8849mTue/Thurs/Sat, 67m60mon/Wed/Fri/Sun; no hair/skin/nail changes, palpitations, energy levels are good, no diarrhea or constipation.   Hyperlipidemia: Continue statin therapy; tolerating without issues. Daughter notes that Dr. End Saunders Revel stop it for a month due to concern over some pain she was having - it made no difference, so she went back on.  CLL/Anemia/B12 def: Seeing Dr. CorcMike Gipving labs drawn every few months and has monthly B12 injections.  She is not in  active treatment, only monitoring at this time.   Osteoporosis: Taking fosamax weekly and tolerating without issue. No recent falls, walks with cane.  Dr. LadaSanda Kleined pepcid at the time she initiated the fossamax, no difficulty swallowing, no heartburn, no abdominal pain, no blood in stool.   Patient Active Problem List   Diagnosis Date Noted  . Sinus bradycardia 04/09/2019  . First degree AV block 04/09/2019  . Postural dizziness with presyncope 02/25/2019  . Osteoporosis 08/21/2018  . Osteoarthritis of left hip 05/21/2018  . Bilateral carotid artery stenosis 04/19/2018  . History of syncope 10/17/2017  . MCI (mild cognitive impairment) 08/12/2017  . Lung nodules 07/09/2017  . PAF (paroxysmal atrial fibrillation) (HCC)[place]/24/2018  . Pleural effusion 10/20/2016  . Hyponatremia 10/20/2016  . Chronic diastolic congestive heart failure (HCC)[place]/08/2016  . Primary open-angle glaucoma, bilateral, mild stage 05/08/2016  . Labile blood glucose 04/27/2016  . Bladder prolapse, female, acquired 02/25/2016  . 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  . B12 deficiency 01/11/2016  . Anemia 01/10/2016  . CLL (chronic lymphocytic leukemia) (HCC)[place]/17/2017  . 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 12/21/2015  . Bradycardia 12/21/2015  . Lymphocytosis 06/25/2015  . Impaired fasting glucose 06/22/2015  . Carotid atherosclerosis 06/22/2015  . Essential hypertension 12/22/2014  . Hyperlipidemia 12/22/2014  . Hypothyroidism 12/22/2014    Past Surgical History:  Procedure Laterality Date  . APPENDECTOMY    . EYE SURGERY  Glaucoma  . TOTAL ABDOMINAL HYSTERECTOMY      Family History  Problem Relation Age of Onset  . Heart attack Mother   . Glaucoma Mother   . Heart attack Father   . Parkinson's disease Brother   . Heart attack Brother     Social History   Socioeconomic History  . Marital status: Widowed    Spouse name: Not on  file  . Number of children: 2  . Years of education: Not on file  . Highest education level: High school graduate  Occupational History  . Occupation: retired  Scientific laboratory technician  . Financial resource strain: Not hard at all  . Food insecurity    Worry: Never true    Inability: Never true  . Transportation needs    Medical: No    Non-medical: No  Tobacco Use  . Smoking status: Never Smoker  . Smokeless tobacco: Never Used  Substance and Sexual Activity  . Alcohol use: No  . Drug use: No  . Sexual activity: Not Currently  Lifestyle  . Physical activity    Days per week: 7 days    Minutes per session: 30 min  . Stress: Not at all  Relationships  . Social connections    Talks on phone: More than three times a week    Gets together: More than three times a week    Attends religious service: More than 4 times per year    Active member of club or organization: No    Attends meetings of clubs or organizations: Never    Relationship status: Widowed  . Intimate partner violence    Fear of current or ex partner: No    Emotionally abused: No    Physically abused: No    Forced sexual activity: No  Other Topics Concern  . Not on file  Social History Narrative  . Not on file     Current Outpatient Medications:  .  ACCU-CHEK SOFTCLIX LANCETS lancets, Use as instructed; check once a day; H40.1131, Disp: 100 each, Rfl: 12 .  alendronate (FOSAMAX) 70 MG tablet, Take 1 tablet (70 mg total) by mouth every 7 (seven) days. Take with a full glass of water on an empty stomach., Disp: 4 tablet, Rfl: 11 .  amLODipine (NORVASC) 5 MG tablet, Take 1 tablet (5 mg total) by mouth daily., Disp: 90 tablet, Rfl: 1 .  Calcium Carb-Cholecalciferol (CALCIUM 600 + D PO), Take 1 tablet by mouth daily. , Disp: , Rfl:  .  cholecalciferol (VITAMIN D) 1000 units tablet, Take 1,000 Units by mouth daily., Disp: , Rfl:  .  Cyanocobalamin (B-12) 1000 MCG/ML KIT, Inject 1,000 mcg as directed every 30 (thirty) days.,  Disp: , Rfl:  .  EUTHYROX 75 MCG tablet, TAKE ONE TABLET BY MOUTH ON MONDAYS, WEDNESDAYS, FRIDAYS, AND SUNDAYS., Disp: 52 tablet, Rfl: 0 .  famotidine (PEPCID) 20 MG tablet, Take 1 tablet (20 mg total) by mouth at bedtime., Disp: 30 tablet, Rfl: 5 .  glucose blood (ACCU-CHEK AVIVA PLUS) test strip, Use as instructed, check once a day; H40.1131, Disp: 100 each, Rfl: 3 .  IRON PO, Take 1 tablet by mouth 2 (two) times daily. , Disp: , Rfl:  .  latanoprost (XALATAN) 0.005 % ophthalmic solution, Place 1 drop into the right eye at bedtime. , Disp: , Rfl:  .  levothyroxine (SYNTHROID) 88 MCG tablet, TAKE ONE TABLET BY MOUTH ON TUESDAYS, THURSDAYS, AND SATURDAYS., Disp: 39 tablet, Rfl: 0 .  lisinopril (ZESTRIL) 10  MG tablet, Take 1 tablet (10 mg total) by mouth daily., Disp: 90 tablet, Rfl: 1 .  Multiple Vitamins-Minerals (MULTIVITAMIN ADULT PO), Take by mouth., Disp: , Rfl:  .  Omega-3 Fatty Acids (FISH OIL) 1200 MG CAPS, Take by mouth daily., Disp: , Rfl:  .  rivaroxaban (XARELTO) 20 MG TABS tablet, Take 1 tablet (20 mg total) by mouth daily with supper., Disp: , Rfl:  .  atorvastatin (LIPITOR) 20 MG tablet, Take 1 tablet (20 mg total) by mouth daily., Disp: 90 tablet, Rfl: 3  Allergies  Allergen Reactions  . Other   . Alphagan [Brimonidine] Itching and Rash  . Pravachol [Pravastatin Sodium] Itching and Rash  . Pravastatin Itching, Other (See Comments) and Rash    I personally reviewed active problem list, medication list, allergies, health maintenance, notes from last encounter, lab results with the patient/caregiver today.   ROS  Ten systems reviewed and is negative except as mentioned in HPI   Objective  Vitals:   05/13/19 1134  BP: 124/82  Pulse: 71  Resp: 14  Temp: (!) 97.3 F (36.3 C)  TempSrc: Temporal  SpO2: 98%  Weight: 143 lb 14.4 oz (65.3 kg)  Height: 5' 2" (1.575 m)   Body mass index is 26.32 kg/m.  Physical Exam Constitutional: Patient appears well-developed and  well-nourished. No distress.  HENT: Head: Normocephalic and atraumatic.  Eyes: Conjunctivae and EOM are normal. No scleral icterus.   Neck: Normal range of motion. Neck supple. No JVD present. No thyromegaly present.  Cardiovascular: Normal rate, regular rhythm and normal heart sounds.  No murmur heard. Non-pitting edema to bilateral lower legs. Pulmonary/Chest: Effort normal and breath sounds normal. No respiratory distress. Musculoskeletal: Normal range of motion, no joint effusions. No gross deformities Neurological: Pt is alert and oriented to person, place, and time. No cranial nerve deficit. Coordination, balance, strength, speech and gait are normal.  Skin: Skin is warm and dry. No rash noted. No erythema.  Psychiatric: Patient has a normal mood and affect. behavior is normal. Judgment and thought content normal.  No results found for this or any previous visit (from the past 72 hour(s)).  PHQ2/9: Depression screen [facility] 2/9 05/13/2019 03/07/2019 01/10/2019 08/22/2018 07/02/2018  Decreased Interest 0 0 0 0 0  Down, Depressed, Hopeless 0 0 0 0 0  PHQ - 2 Score 0 0 0 0 0  Altered sleeping 0 0 0 0 -  Tired, decreased energy 0 0 0 0 -  Change in appetite 0 0 0 0 -  Feeling bad or failure about yourself  0 0 0 0 -  Trouble concentrating 0 0 0 0 -  Moving slowly or fidgety/restless 0 0 0 0 -  Suicidal thoughts 0 0 0 0 -  PHQ-9 Score 0 0 0 0 -  Difficult doing work/chores Not difficult at all Not difficult at all Not difficult at all Not difficult at all -   PHQ-2/9 Result is negative.    Fall Risk: Fall Risk  05/13/2019 03/07/2019 01/10/2019 08/22/2018 07/02/2018  Falls in the past year? 0 0 0 0 1  Number falls in past yr: 0 0 0 - 1  Comment - - - - -  Injury with Fall? 0 0 0 - 1  Comment - - - - fell on door threshold with face onto kitchen counter, went to ER  Risk Factor Category  - - - - -  Risk for fall due to : - - - - History of fall(s);Impaired balance/gait;Impaired  mobility   Follow up Falls evaluation completed Falls evaluation completed - - Falls evaluation completed;Falls prevention discussed     Assessment & Plan  1. First degree AV block 2. Essential hypertension 3. Chronic diastolic congestive heart failure (HCC) - Seeing Dr. Saunders Revel for management and is doing well; no recent near-syncope, HR  4. Hypothyroidism, unspecified type - Stable; had TSH 2 months ago, will check at January CPE.  Compliant with alternating doses.  5. Mixed hyperlipidemia - Continuing statin therapy at this time, she is followed by Dr. Saunders Revel  6. CLL (chronic lymphocytic leukemia) (HCC) - Following with Dr. Mike Gip  7. B12 deficiency - Following with Dr. Mike Gip  8. Anemia, unspecified type - Following with Dr. Mike Gip  9. PAF (paroxysmal atrial fibrillation) (HCC) - Seeing Dr. Saunders Revel, HR controlled without beta blocker.   10. Age-related osteoporosis without current pathological fracture - Taking Fossamax without concerns.

## 2019-05-13 NOTE — Telephone Encounter (Signed)
Results of labs received and scanned into Epic yesterday. Labs drawn on 04/25/19 per request of Dr End. Routing to Dr End to make him aware labs are available in Epic for his review.

## 2019-05-14 ENCOUNTER — Encounter: Payer: Self-pay | Admitting: *Deleted

## 2019-05-14 NOTE — Telephone Encounter (Signed)
Results on result note put on MyChart and letter mailed.

## 2019-05-14 NOTE — Telephone Encounter (Signed)
Please let Sydney Mcdaniel know that her kidney function and electrolytes remain normal.  She should continue her current medications and follow-up as previously discussed.

## 2019-05-21 ENCOUNTER — Other Ambulatory Visit: Payer: Self-pay

## 2019-05-26 ENCOUNTER — Encounter: Payer: Self-pay | Admitting: Family Medicine

## 2019-05-26 ENCOUNTER — Other Ambulatory Visit: Payer: Self-pay

## 2019-05-26 ENCOUNTER — Inpatient Hospital Stay: Payer: Medicare Other | Attending: Hematology and Oncology

## 2019-05-26 VITALS — BP 149/63 | HR 65 | Temp 97.0°F | Resp 18

## 2019-05-26 DIAGNOSIS — E538 Deficiency of other specified B group vitamins: Secondary | ICD-10-CM | POA: Diagnosis not present

## 2019-05-26 MED ORDER — CYANOCOBALAMIN 1000 MCG/ML IJ SOLN
1000.0000 ug | Freq: Once | INTRAMUSCULAR | Status: AC
  Administered 2019-05-26: 1000 ug via INTRAMUSCULAR
  Filled 2019-05-26: qty 1

## 2019-06-18 ENCOUNTER — Other Ambulatory Visit: Payer: Self-pay

## 2019-06-23 ENCOUNTER — Other Ambulatory Visit: Payer: Self-pay

## 2019-06-23 ENCOUNTER — Inpatient Hospital Stay: Payer: Medicare Other | Attending: Hematology and Oncology

## 2019-06-23 VITALS — BP 142/52 | HR 55 | Temp 98.0°F | Resp 18

## 2019-06-23 DIAGNOSIS — E538 Deficiency of other specified B group vitamins: Secondary | ICD-10-CM | POA: Diagnosis not present

## 2019-06-23 MED ORDER — CYANOCOBALAMIN 1000 MCG/ML IJ SOLN
1000.0000 ug | Freq: Once | INTRAMUSCULAR | Status: AC
  Administered 2019-06-23: 11:00:00 1000 ug via INTRAMUSCULAR

## 2019-06-23 NOTE — Patient Instructions (Signed)

## 2019-06-26 DIAGNOSIS — E119 Type 2 diabetes mellitus without complications: Secondary | ICD-10-CM | POA: Diagnosis not present

## 2019-06-26 DIAGNOSIS — H401132 Primary open-angle glaucoma, bilateral, moderate stage: Secondary | ICD-10-CM | POA: Diagnosis not present

## 2019-07-08 ENCOUNTER — Ambulatory Visit (INDEPENDENT_AMBULATORY_CARE_PROVIDER_SITE_OTHER): Payer: Medicare Other

## 2019-07-08 VITALS — BP 145/51 | HR 56 | Temp 98.0°F | Ht 62.0 in | Wt 138.0 lb

## 2019-07-08 DIAGNOSIS — Z0000 Encounter for general adult medical examination without abnormal findings: Secondary | ICD-10-CM | POA: Diagnosis not present

## 2019-07-08 NOTE — Patient Instructions (Signed)
Sydney Mcdaniel , Thank you for taking time to come for your Medicare Wellness Visit. I appreciate your ongoing commitment to your health goals. Please review the following plan we discussed and let me know if I can assist you in the future.   Screening recommendations/referrals: Colonoscopy: no longer required Mammogram: no longer required Bone Density: done 08/19/18  Recommended yearly ophthalmology/optometry visit for glaucoma screening and checkup Recommended yearly dental visit for hygiene and checkup  Vaccinations: Influenza vaccine: done 03/18/19 Pneumococcal vaccine: done 12/10/13 Tdap vaccine: done 06/27/17 Shingles vaccine: Shingrix discussed. Please contact your pharmacy for coverage information.   Advanced directives: Please bring a copy of your health care power of attorney and living will to the office at your convenience.  Conditions/risks identified: Recommend continuing to prevent falls and improve mobility  Next appointment: Please follow up in one year for your Medicare Annual Wellness visit.     Preventive Care 70 Years and Older, Female Preventive care refers to lifestyle choices and visits with your health care provider that can promote health and wellness. What does preventive care include?  A yearly physical exam. This is also called an annual well check.  Dental exams once or twice a year.  Routine eye exams. Ask your health care provider how often you should have your eyes checked.  Personal lifestyle choices, including:  Daily care of your teeth and gums.  Regular physical activity.  Eating a healthy diet.  Avoiding tobacco and drug use.  Limiting alcohol use.  Practicing safe sex.  Taking low-dose aspirin every day.  Taking vitamin and mineral supplements as recommended by your health care provider. What happens during an annual well check? The services and screenings done by your health care provider during your annual well check will depend on  your age, overall health, lifestyle risk factors, and family history of disease. Counseling  Your health care provider may ask you questions about your:  Alcohol use.  Tobacco use.  Drug use.  Emotional well-being.  Home and relationship well-being.  Sexual activity.  Eating habits.  History of falls.  Memory and ability to understand (cognition).  Work and work Statistician.  Reproductive health. Screening  You may have the following tests or measurements:  Height, weight, and BMI.  Blood pressure.  Lipid and cholesterol levels. These may be checked every 5 years, or more frequently if you are over 56 years old.  Skin check.  Lung cancer screening. You may have this screening every year starting at age 85 if you have a 30-pack-year history of smoking and currently smoke or have quit within the past 15 years.  Fecal occult blood test (FOBT) of the stool. You may have this test every year starting at age 20.  Flexible sigmoidoscopy or colonoscopy. You may have a sigmoidoscopy every 5 years or a colonoscopy every 10 years starting at age 36.  Hepatitis C blood test.  Hepatitis B blood test.  Sexually transmitted disease (STD) testing.  Diabetes screening. This is done by checking your blood sugar (glucose) after you have not eaten for a while (fasting). You may have this done every 1-3 years.  Bone density scan. This is done to screen for osteoporosis. You may have this done starting at age 23.  Mammogram. This may be done every 1-2 years. Talk to your health care provider about how often you should have regular mammograms. Talk with your health care provider about your test results, treatment options, and if necessary, the need for more tests.  Vaccines  Your health care provider may recommend certain vaccines, such as:  Influenza vaccine. This is recommended every year.  Tetanus, diphtheria, and acellular pertussis (Tdap, Td) vaccine. You may need a Td booster  every 10 years.  Zoster vaccine. You may need this after age 45.  Pneumococcal 13-valent conjugate (PCV13) vaccine. One dose is recommended after age 84.  Pneumococcal polysaccharide (PPSV23) vaccine. One dose is recommended after age 36. Talk to your health care provider about which screenings and vaccines you need and how often you need them. This information is not intended to replace advice given to you by your health care provider. Make sure you discuss any questions you have with your health care provider. Document Released: 07/09/2015 Document Revised: 03/01/2016 Document Reviewed: 04/13/2015 Elsevier Interactive Patient Education  2017 [place] Prevention in the Home Falls can cause injuries. They can happen to people of all ages. There are many things you can do to make your home safe and to help prevent falls. What can I do on the outside of my home?  Regularly fix the edges of walkways and driveways and fix any cracks.  Remove anything that might make you trip as you walk through a door, such as a raised step or threshold.  Trim any bushes or trees on the path to your home.  Use bright outdoor lighting.  Clear any walking paths of anything that might make someone trip, such as rocks or tools.  Regularly check to see if handrails are loose or broken. Make sure that both sides of any steps have handrails.  Any raised decks and porches should have guardrails on the edges.  Have any leaves, snow, or ice cleared regularly.  Use sand or salt on walking paths during winter.  Clean up any spills in your garage right away. This includes oil or grease spills. What can I do in the bathroom?  Use night lights.  Install grab bars by the toilet and in the tub and shower. Do not use towel bars as grab bars.  Use non-skid mats or decals in the tub or shower.  If you need to sit down in the shower, use a plastic, non-slip stool.  Keep the floor dry. Clean up any  water that spills on the floor as soon as it happens.  Remove soap buildup in the tub or shower regularly.  Attach bath mats securely with double-sided non-slip rug tape.  Do not have throw rugs and other things on the floor that can make you trip. What can I do in the bedroom?  Use night lights.  Make sure that you have a light by your bed that is easy to reach.  Do not use any sheets or blankets that are too big for your bed. They should not hang down onto the floor.  Have a firm chair that has side arms. You can use this for support while you get dressed.  Do not have throw rugs and other things on the floor that can make you trip. What can I do in the kitchen?  Clean up any spills right away.  Avoid walking on wet floors.  Keep items that you use a lot in easy-to-reach places.  If you need to reach something above you, use a strong step stool that has a grab bar.  Keep electrical cords out of the way.  Do not use floor polish or wax that makes floors slippery. If you must use wax, use non-skid floor wax.  Do not have throw rugs and other things on the floor that can make you trip. What can I do with my stairs?  Do not leave any items on the stairs.  Make sure that there are handrails on both sides of the stairs and use them. Fix handrails that are broken or loose. Make sure that handrails are as long as the stairways.  Check any carpeting to make sure that it is firmly attached to the stairs. Fix any carpet that is loose or worn.  Avoid having throw rugs at the top or bottom of the stairs. If you do have throw rugs, attach them to the floor with carpet tape.  Make sure that you have a light switch at the top of the stairs and the bottom of the stairs. If you do not have them, ask someone to add them for you. What else can I do to help prevent falls?  Wear shoes that:  Do not have high heels.  Have rubber bottoms.  Are comfortable and fit you well.  Are closed  at the toe. Do not wear sandals.  If you use a stepladder:  Make sure that it is fully opened. Do not climb a closed stepladder.  Make sure that both sides of the stepladder are locked into place.  Ask someone to hold it for you, if possible.  Clearly mark and make sure that you can see:  Any grab bars or handrails.  First and last steps.  Where the edge of each step is.  Use tools that help you move around (mobility aids) if they are needed. These include:  Canes.  Walkers.  Scooters.  Crutches.  Turn on the lights when you go into a dark area. Replace any light bulbs as soon as they burn out.  Set up your furniture so you have a clear path. Avoid moving your furniture around.  If any of your floors are uneven, fix them.  If there are any pets around you, be aware of where they are.  Review your medicines with your doctor. Some medicines can make you feel dizzy. This can increase your chance of falling. Ask your doctor what other things that you can do to help prevent falls. This information is not intended to replace advice given to you by your health care provider. Make sure you discuss any questions you have with your health care provider. Document Released: 04/08/2009 Document Revised: 11/18/2015 Document Reviewed: 07/17/2014 Elsevier Interactive Patient Education  2017 [employer].

## 2019-07-08 NOTE — Progress Notes (Signed)
Subjective:   Sydney Mcdaniel is a 86 y.o. female who presents for Medicare Annual (Subsequent) preventive examination.  Virtual Visit via Telephone Note  I connected with Sydney Mcdaniel on 07/08/19 at  8:40 AM EST by telephone and verified that I am speaking with the correct person using two identifiers.  Medicare Annual Wellness visit completed telephonically due to Covid-19 pandemic.   Location: Patient: home Provider: office   I discussed the limitations, risks, security and privacy concerns of performing an evaluation and management service by telephone and the availability of in person appointments. The patient expressed understanding and agreed to proceed.  Some vital signs may be absent or patient reported.   Clemetine Marker, LPN    Review of Systems:   Cardiac Risk Factors include: advanced age (>90mn, >>90women);dyslipidemia;hypertension     Objective:     Vitals: BP (!) 145/51   Pulse (!) 56   Temp 98 F (36.7 C)   Ht '5\' 2"'  (1.575 m)   Wt 138 lb (62.6 kg)   LMP  (LMP Unknown)   SpO2 99%   BMI 25.24 kg/m   Body mass index is 25.24 kg/m.  Advanced Directives 07/08/2019 02/25/2019 02/25/2019 01/06/2019 07/02/2018 05/13/2018 11/08/2017  Does Patient Have a Medical Advance Directive? Yes Yes No No Yes No No  Type of [place]amed[place] will - -  Does patient want to make changes to medical advance directive? - No - Patient declined - - - - No - Patient declined  Copy of [place] Chart? No - copy requested No - copy requested - - No - copy requested - -  Would patient like information on creating a medical advance directive? - No - Patient declined No - Guardian declined No - Patient declined - - No - Patient declined    Tobacco Social History   Tobacco Use  Smoking Status Never Smoker  Smokeless Tobacco Never Used     Counseling  given: Not Answered   Clinical Intake:  Pre-visit preparation completed: Yes  Pain : No/denies pain     BMI - recorded: 25.24 Nutritional Status: BMI 25 -29 Overweight Nutritional Risks: None Diabetes: No  How often do you need to have someone help you when you read instructions, pamphlets, or other written materials from your doctor or pharmacy?: 1 - Never  Interpreter Needed?: No  Information entered by :: KClemetine MarkerLPN  Past Medical History:  Diagnosis Date  . Allergy   . Anemia   . Cataract   . CLL (chronic lymphocytic leukemia) ([place]   . Glaucoma   . H/O: hysterectomy    Total  . Hyperlipidemia   . Hypertension   . Hypothyroidism   . Impaired fasting glucose   . Lichen sclerosus   . Osteoporosis    Hips  . PAF (paroxysmal atrial fibrillation) ([place]    a. 06/15/2016 Event monitor: 4% afib burden; b. CHA2DS2VASc - 4-->Xarelto.  . Syncope    a. 03/2016 Echo: EF55-60%, no rwma, mild AI/MR, nl PASP; b. 03/2016 48h Holter: no significant arrhythmias/pauses; c. 03/2016 MV: mild apical defect, likely breast attenuation, nl EF, low risk; d. 05/2016 Event monitor: No significant arrhythmia; e. 05/2016 Event monitor: PAF (4%).   Past Surgical History:  Procedure Laterality Date  . APPENDECTOMY    . EYE SURGERY     Glaucoma  . TOTAL ABDOMINAL HYSTERECTOMY  Family History  Problem Relation Age of Onset  . Heart attack Mother   . Glaucoma Mother   . Heart attack Father   . Parkinson's disease Brother   . Heart attack Brother    Social History   Socioeconomic History  . Marital status: Widowed    Spouse name: Not on file  . Number of children: 2  . Years of education: Not on file  . Highest education level: High school graduate  Occupational History  . Occupation: retired  Tobacco Use  . Smoking status: Never Smoker  . Smokeless tobacco: Never Used  Substance and Sexual Activity  . Alcohol use: No  . Drug use: No  . Sexual activity: Not Currently   Other Topics Concern  . Not on file  Social History Narrative   Pt lives alone.    Social Determinants of Health   Financial Resource Strain: Low Risk   . Difficulty of Paying Living Expenses: Not hard at all  Food Insecurity: No Food Insecurity  . Worried About Charity fundraiser in the Last Year: Never true  . Ran Out of Food in the Last Year: Never true  Transportation Needs: No Transportation Needs  . Lack of Transportation (Medical): No  . Lack of Transportation (Non-Medical): No  Physical Activity: Sufficiently Active  . Days of Exercise per Week: 7 days  . Minutes of Exercise per Session: 30 min  Stress: No Stress Concern Present  . Feeling of Stress : Not at all  Social Connections: Somewhat Isolated  . Frequency of Communication with Friends and Family: More than three times a week  . Frequency of Social Gatherings with Friends and Family: More than three times a week  . Attends Religious Services: More than 4 times per year  . Active Member of Clubs or Organizations: No  . Attends Archivist Meetings: Never  . Marital Status: Widowed    Outpatient Encounter Medications as of 07/08/2019  Medication Sig  . ACCU-CHEK SOFTCLIX LANCETS lancets Use as instructed; check once a day; H40.1131  . alendronate (FOSAMAX) 70 MG tablet Take 1 tablet (70 mg total) by mouth every 7 (seven) days. Take with a full glass of water on an empty stomach.  Marland Kitchen amLODipine (NORVASC) 5 MG tablet Take 1 tablet (5 mg total) by mouth daily.  . Calcium Carb-Cholecalciferol (CALCIUM 600 + D PO) Take 1 tablet by mouth daily.   . cholecalciferol (VITAMIN D) 1000 units tablet Take 1,000 Units by mouth daily.  . Cyanocobalamin (B-12) 1000 MCG/ML KIT Inject 1,000 mcg as directed every 30 (thirty) days.  . EUTHYROX 75 MCG tablet TAKE ONE TABLET BY MOUTH ON MONDAYS, WEDNESDAYS, FRIDAYS, AND SUNDAYS.  . famotidine (PEPCID) 20 MG tablet Take 1 tablet (20 mg total) by mouth at bedtime.  Marland Kitchen glucose  blood (ACCU-CHEK AVIVA PLUS) test strip Use as instructed, check once a day; H40.1131  . IRON PO Take 1 tablet by mouth 2 (two) times daily.   Marland Kitchen latanoprost (XALATAN) 0.005 % ophthalmic solution Place 1 drop into the right eye at bedtime.   Marland Kitchen levothyroxine (SYNTHROID) 88 MCG tablet TAKE ONE TABLET BY MOUTH ON TUESDAYS, THURSDAYS, AND SATURDAYS.  Marland Kitchen Multiple Vitamins-Minerals (MULTIVITAMIN ADULT PO) Take by mouth.  . Omega-3 Fatty Acids (FISH OIL) 1200 MG CAPS Take by mouth daily.  . rivaroxaban (XARELTO) 20 MG TABS tablet Take 1 tablet (20 mg total) by mouth daily with supper.  . timolol (BETIMOL) 0.5 % ophthalmic solution Place 1 drop  into the right eye 2 (two) times daily.  Marland Kitchen atorvastatin (LIPITOR) 20 MG tablet Take 1 tablet (20 mg total) by mouth daily.  Marland Kitchen lisinopril (ZESTRIL) 10 MG tablet Take 1 tablet (10 mg total) by mouth daily.   No facility-administered encounter medications on file as of 07/08/2019.    Activities of Daily Living In your present state of health, do you have any difficulty performing the following activities: 07/08/2019 02/25/2019  Hearing? N N  Comment declines hearing aids -  Vision? Y N  Comment glaucoma -  Difficulty concentrating or making decisions? Y N  Comment mild cognitive impairment -  Walking or climbing stairs? N Y  Dressing or bathing? N N  Doing errands, shopping? N Y  Conservation officer, nature and eating ? N -  Using the Toilet? N -  In the past six months, have you accidently leaked urine? Y -  Comment wears pads/depends for protection -  Do you have problems with loss of bowel control? N -  Managing your Medications? N -  Managing your Finances? N -  Housekeeping or managing your Housekeeping? N -  Some recent data might be hidden    Patient Care Team: Hubbard Hartshorn, FNP as PCP - General (Family Medicine) End, Harrell Gave, MD as PCP - Cardiology (Cardiology) Wende Bushy, MD as Consulting Physician (Cardiology) Lequita Asal, MD as Referring  Physician (Hematology and Oncology) Anell Barr, OD (Optometry) Gae Dry, MD as Referring Physician (Obstetrics and Gynecology) Delana Meyer, Dolores Lory, MD (Vascular Surgery)    Assessment:   This is a routine wellness examination for Sydney Mcdaniel.  Exercise Activities and Dietary recommendations Current Exercise Habits: Home exercise routine, Type of exercise: stretching;calisthenics, Time (Minutes): 30, Frequency (Times/Week): 7, Weekly Exercise (Minutes/Week): 210, Intensity: Mild, Exercise limited by: orthopedic condition(s)  Goals    . Prevent falls     Continue physical therapy to increase balance and strengthening exercises       Fall Risk Fall Risk  07/08/2019 05/13/2019 03/07/2019 01/10/2019 08/22/2018  Falls in the past year? 0 0 0 0 0  Number falls in past yr: 0 0 0 0 -  Comment - - - - -  Injury with Fall? 0 0 0 0 -  Comment - - - - -  Risk Factor Category  - - - - -  Risk for fall due to : History of fall(s);Impaired balance/gait;Impaired vision - - - -  Follow up Falls prevention discussed Falls evaluation completed Falls evaluation completed - -   FALL RISK PREVENTION PERTAINING TO THE HOME:  Any stairs in or around the home? Yes  If so, do they handrails? Yes   Home free of loose throw rugs in walkways, pet beds, electrical cords, etc? Yes  Adequate lighting in your home to reduce risk of falls? Yes   ASSISTIVE DEVICES UTILIZED TO PREVENT FALLS:  Life alert? Yes  Use of a cane, walker or w/c? Yes  Grab bars in the bathroom? Yes  Shower chair or bench in shower? Yes  Elevated toilet seat or a handicapped toilet? Yes   DME ORDERS:  DME order needed?  No   TIMED UP AND GO:  Was the test performed? No . Telephonic visit.  Education: Fall risk prevention has been discussed.  Intervention(s) required? No   Depression Screen PHQ 2/9 Scores 07/08/2019 05/13/2019 03/07/2019 01/10/2019  PHQ - 2 Score 0 0 0 0  PHQ- 9 Score - 0 0 0     Cognitive Function  6CIT Screen 07/08/2019 07/02/2018 06/27/2017  What Year? 0 points 0 points 0 points  What month? 0 points 0 points 0 points  What time? 0 points 0 points 0 points  Count back from 20 0 points 0 points 2 points  Months in reverse 0 points 0 points 0 points  Repeat phrase 4 points 2 points 6 points  Total Score '4 2 8    ' Immunization History  Administered Date(s) Administered  . Influenza, High Dose Seasonal PF 03/22/2016, 03/24/2017, 04/01/2018, 03/18/2019  . Influenza,inj,Quad PF,6+ Mos 04/13/2015  . Influenza-Unspecified 04/11/2012, 03/10/2014, 03/24/2017  . Pneumococcal Conjugate-13 12/10/2013  . Pneumococcal Polysaccharide-23 04/13/2008  . Td 01/22/2004  . Tdap 06/27/2017  . Zoster 04/10/2006    Qualifies for Shingles Vaccine? Yes . Due for Shingrix. Education has been provided regarding the importance of this vaccine. Pt has been advised to call insurance company to determine out of pocket expense. Advised may also receive vaccine at local pharmacy or Health Dept. Verbalized acceptance and understanding.  Tdap: Up to date  Flu Vaccine: Up to date  Pneumococcal Vaccine: Up to date   Screening Tests Health Maintenance  Topic Date Due  . URINE MICROALBUMIN  06/21/2016  . TETANUS/TDAP  06/28/2027  . INFLUENZA VACCINE  Completed  . DEXA SCAN  Completed  . PNA vac Low Risk Adult  Completed    Cancer Screenings:  Colorectal Screening: No longer required.  Mammogram:  No longer required.   Bone Density: Completed 08/19/18. Results reflect  OSTEOPOROSIS. Repeat every 2 years.  Lung Cancer Screening: (Low Dose CT Chest recommended if Age 37-80 years, 30 pack-year currently smoking OR have quit w/in 15years.) does not qualify.    Additional Screening:  Hepatitis C Screening: no longer required  Vision Screening: Recommended annual ophthalmology exams for early detection of glaucoma and other disorders of the eye. Is the patient up to date with their annual eye exam?   Yes  Who is the provider or what is the name of the office in which the pt attends annual eye exams? Dr. Ellin Mayhew  Dental Screening: Recommended annual dental exams for proper oral hygiene  Community Resource Referral:  CRR required this visit?  No      Plan:     I have personally reviewed and addressed the Medicare Annual Wellness questionnaire and have noted the following in the patient's chart:  A. Medical and social history B. Use of alcohol, tobacco or illicit drugs  C. Current medications and supplements D. Functional ability and status E.  Nutritional status F.  Physical activity G. Advance directives H. List of other physicians I.  Hospitalizations, surgeries, and ER visits in previous 12 months J.  [place] such as hearing and vision if needed, cognitive and depression L. Referrals and appointments   In addition, I have reviewed and discussed with patient certain preventive protocols, quality metrics, and best practice recommendations. A written personalized care plan for preventive services as well as general preventive health recommendations were provided to patient.   Signed,  Clemetine Marker, LPN Nurse Health Advisor   Nurse Notes: pt doing well and appreciative of visit today. Accompanied during telephonic visit by daughter Sydney Mcdaniel.

## 2019-07-14 ENCOUNTER — Other Ambulatory Visit: Payer: Self-pay | Admitting: Family Medicine

## 2019-07-14 DIAGNOSIS — M810 Age-related osteoporosis without current pathological fracture: Secondary | ICD-10-CM

## 2019-07-14 NOTE — Progress Notes (Signed)
Follow-up Outpatient Visit Date: 07/16/2019  Primary Care Provider: Hubbard Hartshorn, [place] [id number]  Chief Complaint: Follow-up atrial fibrillation and syncope  HPI:  Sydney Mcdaniel is a 86 y.o. female with history of recurrent falls felt to be due to orthostatic hypotension and vasovagal syncope,paroxysmal atrial fibrillation, HFpEF, HTN, CLL, vitamin B12 deficiency, and anemia, who presents for follow-up of atrial fibrillation and syncope.  I last saw her in 03/2019, at which time she was doing well.  Heart rates were noted to be higher than in the past since discontinuing timolol.  Blood pressure, however, was elevated despite prior escalation of lisinopril.  She reached out to [place] in November due to continued blood pressure elevations.  We advised her to increase amlodipine to 5 mg daily and continue the remainder of her other medications.  Today, Sydney Mcdaniel reports that she is feeling well.  Due to worsening vision changes after stopping timolol, this medication was ultimately restarted by her ophthalmologist.  Heart rates have been a little bit lower.  She denies chest pain, shortness of breath, palpitations, and lightheadedness.  Chronic lower extremity edema is stable and seems to improve when she uses compression stockings.  Blood pressures are improved from our prior visit, though systolic readings are typically still 140 to 160 mmHg.  She has not had any falls.  She is tolerating her current medications well.  She denies bleeding and falls.  --------------------------------------------------------------------------------------------------  Cardiovascular History & Procedures: Cardiovascular Problems:  Recurrent syncope  Paroxysmal atrial fibrillation  HFpEF  Risk Factors:  Hypertension and age greater than 59  Cath/PCI:  None  CV Surgery:  None  EP Procedures and Devices:  Event monitor (06/15/16): Dominantly sinus rhythm with  paroxysmal atrial fibrillation (4% burden). No significant pauses.  Event monitor(06/02/16): Normal sinus rhythm with first-degree AV block. No significant arrhythmia.  Holter monitor (04/17/16): Predominantly sinus rhythm with first-degree AV block. PACs and short atrial runs lasting up to 4 beats noted. Rare PVCs.  Non-Invasive Evaluation(s):  Pharmacologic MPI (02/26/19): Low risk study without ischemia.  Unable to assess LVEF secondary to GI uptake.  TTE (02/26/19): Normal LV size and wall thickness.  LVEF 60-65% with grade 2 diastolic dysfunction.  Normal RV size and function.  Unable to assess RVSP.  No significant valvular abnormality.  Carotid Doppler (05/02/18): Mild right ICA stenosis (1-39%).  Moderate left ICA stenosis (40-59%).  Carotid Doppler (04/30/17): Moderate (40-59%) proximal ICA stenoses bilaterally. Greater than 50% left external carotid artery stenosis. No significant change from prior study in 04/2016.  TTE (04/17/16): Normal LV size. LVEF 55-60%. Normal wall motion and diastolic function. Mild aortic and mitral regurgitation. Normal RV size and function. Normal pulmonary artery pressure.  Pharmacologic MPI (04/10/16): Low risk study with small fixed defect at the apex, likely due to breast attenuation. No ischemia. LVEF 55-65%.  Recent CV Pertinent Labs: Lab Results  Component Value Date   CHOL 174 07/02/2018   CHOL 141 08/24/2015   HDL 57 07/02/2018   HDL 52 08/24/2015   LDLCALC 90 07/02/2018   TRIG 173 (H) 07/02/2018   CHOLHDL 3.1 07/02/2018   K 4.2 02/26/2019   MG 2.2 12/21/2015   BUN 11 02/26/2019   BUN 14 03/12/2018   CREATININE 0.59 02/26/2019   CREATININE 0.81 01/27/2019    Past medical and surgical history were reviewed and updated in EPIC.  Current Meds  Medication Sig  . ACCU-CHEK SOFTCLIX LANCETS lancets Use as instructed; check once  a day; H40.1131  . alendronate (FOSAMAX) 70 MG tablet TAKE ONE TABLET BY MOUTH EVERY 7 DAYS. TAKE  WITH A FULL GLASS OF WATER ON AN EMPTY STOMACH.  Marland Kitchen amLODipine (NORVASC) 5 MG tablet Take 1 tablet (5 mg total) by mouth daily.  Marland Kitchen atorvastatin (LIPITOR) 20 MG tablet Take 1 tablet (20 mg total) by mouth daily.  . Calcium Carb-Cholecalciferol (CALCIUM 600 + D PO) Take 1 tablet by mouth daily.   . cholecalciferol (VITAMIN D) 1000 units tablet Take 1,000 Units by mouth daily.  . Cyanocobalamin (B-12) 1000 MCG/ML KIT Inject 1,000 mcg as directed every 30 (thirty) days.  . EUTHYROX 75 MCG tablet TAKE ONE TABLET BY MOUTH ON MONDAYS, WEDNESDAYS, FRIDAYS, AND SUNDAYS.  . famotidine (PEPCID) 20 MG tablet Take 1 tablet (20 mg total) by mouth at bedtime.  Marland Kitchen glucose blood (ACCU-CHEK AVIVA PLUS) test strip Use as instructed, check once a day; H40.1131  . IRON PO Take 1 tablet by mouth 2 (two) times daily.   Marland Kitchen latanoprost (XALATAN) 0.005 % ophthalmic solution Place 1 drop into the right eye at bedtime.   Marland Kitchen levothyroxine (SYNTHROID) 88 MCG tablet TAKE ONE TABLET BY MOUTH ON TUESDAYS, THURSDAYS, AND SATURDAYS.  Marland Kitchen Multiple Vitamins-Minerals (MULTIVITAMIN ADULT PO) Take by mouth.  . Omega-3 Fatty Acids (FISH OIL) 1200 MG CAPS Take by mouth daily.  . timolol (BETIMOL) 0.5 % ophthalmic solution Place 1 drop into the right eye 2 (two) times daily.  . [DISCONTINUED] lisinopril (ZESTRIL) 10 MG tablet Take 1 tablet (10 mg total) by mouth daily.  . [DISCONTINUED] rivaroxaban (XARELTO) 20 MG TABS tablet Take 1 tablet (20 mg total) by mouth daily with supper.    Allergies: Other, Alphagan [brimonidine], Pravachol [pravastatin sodium], and Pravastatin  Social History   Tobacco Use  . Smoking status: Never Smoker  . Smokeless tobacco: Never Used  Substance Use Topics  . Alcohol use: No  . Drug use: No    Family History  Problem Relation Age of Onset  . Heart attack Mother   . Glaucoma Mother   . Heart attack Father   . Parkinson's disease Brother   . Heart attack Brother     Review of Systems: A  12-system review of systems was performed and was negative except as noted in the HPI.  --------------------------------------------------------------------------------------------------  Physical Exam: BP (!) 158/60 (BP Location: Left Arm, Patient Position: Sitting, Cuff Size: Normal)   Pulse (!) 55   Ht '5\' 2"'  (1.575 m)   Wt 141 lb (64 kg)   LMP  (LMP Unknown)   SpO2 98%   BMI 25.79 kg/m   General: NAD.  Accompanied by her daughter. HEENT: No conjunctival pallor or scleral icterus. Facemask in place. Neck: Supple without lymphadenopathy, thyromegaly, JVD, or HJR. Lungs: Normal work of breathing. Clear to auscultation bilaterally without wheezes or crackles. Heart: Bradycardic but regular with 1/6 systolic murmur.  No rubs or gallops. Non-displaced PMI. Abd: Bowel sounds present. Soft, NT/ND without hepatosplenomegaly Ext: No lower extremity edema. Radial, PT, and DP pulses are 2+ bilaterally. Skin: Warm and dry without rash.  EKG: Sinus bradycardia (heart rate 55 bpm) with low voltage.  Lab Results  Component Value Date   WBC 6.1 02/26/2019   HGB 10.9 (L) 02/26/2019   HCT 32.7 (L) 02/26/2019   MCV 92.9 02/26/2019   PLT 162 02/26/2019    Lab Results  Component Value Date   NA 137 02/26/2019   K 4.2 02/26/2019   CL 103 02/26/2019  CO2 26 02/26/2019   BUN 11 02/26/2019   CREATININE 0.59 02/26/2019   GLUCOSE 120 (H) 02/26/2019   ALT 18 01/10/2019    Lab Results  Component Value Date   CHOL 174 07/02/2018   HDL 57 07/02/2018   LDLCALC 90 07/02/2018   TRIG 173 (H) 07/02/2018   CHOLHDL 3.1 07/02/2018    --------------------------------------------------------------------------------------------------  ASSESSMENT AND PLAN: Hypertension: Blood pressure has improved but still remains consistently elevated at home and in the office today.  She has tolerated careful escalation of lisinopril and amlodipine.  I am worried that further dose increases of amlodipine may  worsen her lower extremity edema.  We discussed escalation of lisinopril versus addition of a thiazide diuretic.  However, Sydney Mcdaniel experienced lightheadedness and syncope in the past with loop diuretic therapy, thought to be due to intravascular volume depletion.  Therefore we will increase lisinopril to 15 mg daily rather than adding HCTZ with CMP in about 2 weeks.  Paroxysmal atrial fibrillation: Sydney Mcdaniel maintains sinus rhythm today.  Continue anticoagulation with rivaroxaban (most recent GFR remains greater than 50).  Continued management of chronic anemia per hematology.  Sinus bradycardia and first-degree AV block: Heart rate has decreased with reinitiation of timolol for management of glaucoma.  Given the lack of symptoms and worsening vision after discontinuation of timolol, I think the benefits outweigh risks of continuing this medication under the direction of ophthalmology.  We will not initiate any other AV nodal blocking agents, given bradycardia.  Hyperlipidemia: Continue atorvastatin.  We will check a fasting lipid panel and CMP when Sydney Mcdaniel returns for follow-up labs in 2 weeks.  Follow-up: Return to clinic in 4 months.  Nelva Bush, MD 07/17/2019 9:10 AM

## 2019-07-16 ENCOUNTER — Ambulatory Visit (INDEPENDENT_AMBULATORY_CARE_PROVIDER_SITE_OTHER): Payer: Medicare Other | Admitting: Internal Medicine

## 2019-07-16 ENCOUNTER — Other Ambulatory Visit: Payer: Self-pay

## 2019-07-16 ENCOUNTER — Encounter: Payer: Self-pay | Admitting: Internal Medicine

## 2019-07-16 VITALS — BP 158/60 | HR 55 | Ht 62.0 in | Wt 141.0 lb

## 2019-07-16 DIAGNOSIS — I10 Essential (primary) hypertension: Secondary | ICD-10-CM | POA: Diagnosis not present

## 2019-07-16 DIAGNOSIS — Z13220 Encounter for screening for lipoid disorders: Secondary | ICD-10-CM

## 2019-07-16 DIAGNOSIS — Z79899 Other long term (current) drug therapy: Secondary | ICD-10-CM

## 2019-07-16 MED ORDER — LISINOPRIL 5 MG PO TABS: 15.0000 mg | ORAL_TABLET | Freq: Every day | ORAL | 1 refills | Status: DC

## 2019-07-16 MED ORDER — RIVAROXABAN 20 MG PO TABS: 20.0000 mg | ORAL_TABLET | Freq: Every day | ORAL | 2 refills | Status: DC

## 2019-07-16 NOTE — Patient Instructions (Signed)
Medication Instructions:  Your physician has recommended you make the following change in your medication:  1- INCREASE Lisinopril to 15 mg (3 tablets) by mouth once a day.  *If you need a refill on your cardiac medications before your next appointment, please call your pharmacy*  Lab Work: Your physician recommends that you return for lab work in: [place] 07/30/19. (CMP, LIPID) - You will need to be fasting. Please do not have anything to eat or drink after midnight the morning you have the lab work. You may only have water or black coffee with no cream or sugar. - Please go to the [facility]. You will check in at the front desk to the right as you walk into the atrium. Valet Parking is offered if needed. - No appointment needed. You may go any day between 7 am and 6 pm.  If you have labs (blood work) drawn today and your tests are completely normal, you will receive your results only by: Marland Kitchen MyChart Message (if you have MyChart) OR . A paper copy in the mail If you have any lab test that is abnormal or we need to change your treatment, we will call you to review the results.  Testing/Procedures: none  Follow-Up: At [facility], you and your health needs are our priority.  As part of our continuing mission to provide you with exceptional heart care, we have created designated Provider Care Teams.  These Care Teams include your primary Cardiologist (physician) and Advanced Practice Providers (APPs -  Physician Assistants and Nurse Practitioners) who all work together to provide you with the care you need, when you need it.  Your next appointment:   4 month(s)  The format for your next appointment:   In Person  Provider:    You may see Nelva Bush, MD or one of the following Advanced Practice Providers on your designated Care Team:    [name], NP  Christell Faith, PA-C  Marrianne Mood, PA-C

## 2019-07-17 ENCOUNTER — Other Ambulatory Visit: Payer: Self-pay | Admitting: *Deleted

## 2019-07-17 ENCOUNTER — Encounter: Payer: Self-pay | Admitting: Internal Medicine

## 2019-07-17 MED ORDER — LISINOPRIL 5 MG PO TABS: 15.0000 mg | ORAL_TABLET | Freq: Every day | ORAL | 1 refills | Status: DC

## 2019-07-18 ENCOUNTER — Other Ambulatory Visit: Payer: Self-pay

## 2019-07-18 ENCOUNTER — Inpatient Hospital Stay: Payer: Medicare Other

## 2019-07-18 ENCOUNTER — Inpatient Hospital Stay: Payer: Medicare Other | Attending: Hematology and Oncology

## 2019-07-18 VITALS — BP 190/68 | HR 90 | Temp 97.2°F | Resp 18

## 2019-07-18 DIAGNOSIS — C9110 Chronic lymphocytic leukemia of B-cell type not having achieved remission: Secondary | ICD-10-CM | POA: Insufficient documentation

## 2019-07-18 DIAGNOSIS — D509 Iron deficiency anemia, unspecified: Secondary | ICD-10-CM | POA: Insufficient documentation

## 2019-07-18 DIAGNOSIS — E538 Deficiency of other specified B group vitamins: Secondary | ICD-10-CM | POA: Insufficient documentation

## 2019-07-18 DIAGNOSIS — D649 Anemia, unspecified: Secondary | ICD-10-CM

## 2019-07-18 LAB — BASIC METABOLIC PANEL
Anion gap: 9 (ref 5–15)
BUN: 19 mg/dL (ref 8–23)
CO2: 23 mmol/L (ref 22–32)
Calcium: 8.9 mg/dL (ref 8.9–10.3)
Chloride: 100 mmol/L (ref 98–111)
Creatinine, Ser: 0.73 mg/dL (ref 0.44–1.00)
GFR calc Af Amer: >60 mL/min (ref >60)
GFR calc non Af Amer: >60 mL/min (ref >60)
Glucose, Bld: 115 mg/dL — ABNORMAL HIGH (ref 70–99)
Potassium: 4.1 mmol/L (ref 3.5–5.1)
Sodium: 132 mmol/L — ABNORMAL LOW (ref 135–145)

## 2019-07-18 LAB — CBC WITH DIFFERENTIAL/PLATELET
Abs Immature Granulocytes: 0 10*3/uL (ref 0.00–0.07)
Basophils Absolute: 0 10*3/uL (ref 0.0–0.1)
Basophils Relative: 0 %
Eosinophils Absolute: 0.1 10*3/uL (ref 0.0–0.5)
Eosinophils Relative: 1 %
HCT: 30.9 % — ABNORMAL LOW (ref 36.0–46.0)
Hemoglobin: 10.3 g/dL — ABNORMAL LOW (ref 12.0–15.0)
Immature Granulocytes: 0 %
Lymphocytes Relative: 53 %
Lymphs Abs: 4.1 10*3/uL — ABNORMAL HIGH (ref 0.7–4.0)
MCH: 31.4 pg (ref 26.0–34.0)
MCHC: 33.3 g/dL (ref 30.0–36.0)
MCV: 94.2 fL (ref 80.0–100.0)
Monocytes Absolute: 0.6 10*3/uL (ref 0.1–1.0)
Monocytes Relative: 8 %
Neutro Abs: 2.9 10*3/uL (ref 1.7–7.7)
Neutrophils Relative %: 38 %
Platelets: 165 10*3/uL (ref 150–400)
RBC: 3.28 MIL/uL — ABNORMAL LOW (ref 3.87–5.11)
RDW: 14.1 % (ref 11.5–15.5)
WBC: 7.6 10*3/uL (ref 4.0–10.5)
nRBC: 0 % (ref 0.0–0.2)

## 2019-07-18 MED ORDER — CYANOCOBALAMIN 1000 MCG/ML IJ SOLN
1000.0000 ug | Freq: Once | INTRAMUSCULAR | Status: AC
  Administered 2019-07-18: 1000 ug via INTRAMUSCULAR

## 2019-07-18 NOTE — Patient Instructions (Signed)

## 2019-07-18 NOTE — Progress Notes (Signed)
Patient just saw cardiologist.  He has increased her blood pressure medication.  Her BP is 190/68.  Patient has informed me that she is due for her 1pm blood pressure medication.

## 2019-07-19 LAB — FOLATE: Folate: 28.2 ng/mL (ref >5.9)

## 2019-07-19 LAB — FERRITIN: Ferritin: 74 ng/mL (ref 11–307)

## 2019-07-19 NOTE — Progress Notes (Signed)
[facility]  [address], [address] [place], [place] [address] Phone: [phone]  Fax: [phone]  [place] Office Visit:  07/21/2019  Referring physician: Arnetha Courser, MD  I connected with Sydney Mcdaniel on 07/21/19 at 10:37 AM by videoconferencing and verified that I was speaking with the correct person using 2 identifiers.  The patient was at home.  I discussed the limitations, risk, security and privacy concerns of performing an evaluation and management service by videoconferencing and the availability of in person appointments.  I also discussed with the patient that there may be a patient responsible charge related to this service.  The patient expressed understanding and agreed to proceed.   Chief Complaint: Sydney Mcdaniel is a 86 y.o. female with chronic lymphocytic leukemia, B12 deficiency, and anemia who is seen for 6 month assessment.    HPI: The patient was last seen in the hematology clinic on 01/06/2019. At that time, noted mild fatigue.  She denied any B symptoms. Exam revealed no adenopathy or hepatosplenomegaly. Hematocrit 32.6, hemoglobin 11.0, MCV 95.3, platelets 162,000, WBC 8000, ANC 2700. Ferritin was 81.   She received monthly B12 injection (01/06/2019 - 07/18/2019).  She was seen for follow up by Dr. Saunders Revel on 02/06/2019. She had no symptom of suggested reoccurrence of atrial fibrillation. She was to continue medication regimen occluding rivaroxaban 20 mg and was to follow up in 6 months. She was doing well on 07/16/2019.  Timolol was restarted due to worsening vision. Lisinopril was increased to 15 mg daily. She has a follow up in 4 months.  She was admitted to [facility] from 02/25/2019 - 02/26/2019 with near syncope and chest pain. Troponins were negative.  Stress test was negative.  EKG showed LBB.  Echo on 02/26/2019 showed an EF of 60-65%.  She continued Norvasc and lisinoprol.   Xarelto continued for stroke prevention.  Follow up with  Dr. Rockey Situ on 03/17/2019 noted no chest pain, shortness of breath, palpitations or lightheadedness. She continued on Xarelto.   Follow up with Dr. Saunders Revel on 07/16/2019 noted that she was doing well. Her heart rate was slightly better. She denied chest pain, shortness of breath, palpitations, and lightheadedness. Chronic lower extremity edema was stable and improving with compression stockings. She maintained sinus rhythm. Lisinopril was increased to 15 mg a day.  She continued atorvastatin.   CBC followed: 02/25/2019: Hematocrit 31.1, hemoglobin 10.7, MCV 93.1, platelets 157,000, WBC 7100.  02/26/2019: Hematocrit 32.7, hemoglobin 10.9, MCV 92.9, platelets 162,000, WBC 6100.  07/18/2019: Hematocrit 30.9, hemoglobin 10.3, MCV 94.2, platelets 165,000, WBC 7,600 (ANC 2,900; ALC 4,100). Ferritin was 74.  Sodium was 132. Folate was 28.2.   During the interim, she has felt "ok". Her energy level is fair. Her daughter notes that twice a day her mother participates in physical therapy. She is in atrial fibrillation. She has leg swelling but it goes down at night. Her daughter denies any bruising with Xarelto. She denies bleeding of any kind. Her left hip pain has improved.   Her sodium was 132 on 07/18/2019. Her daughter notes adding salt to her diet; she is drinking fluids with electrolytes. Her daughter notes that she doing much better. Her daughter is trying to get her mother set up for a COVID-19 vaccine but she is having some trouble getting an appointment. I encouraged her to keep trying and to call the health department early in the morning.   Past Medical History:  Diagnosis Date  . Allergy   .  Anemia   . Cataract   . CLL (chronic lymphocytic leukemia) ([place])   . Glaucoma   . H/O: hysterectomy    Total  . Hyperlipidemia   . Hypertension   . Hypothyroidism   . Impaired fasting glucose   . Lichen sclerosus   . Osteoporosis    Hips  . PAF (paroxysmal atrial fibrillation) ([place])    a. 06/15/2016  Event monitor: 4% afib burden; b. CHA2DS2VASc - 4-->Xarelto.  . Syncope    a. 03/2016 Echo: EF55-60%, no rwma, mild AI/MR, nl PASP; b. 03/2016 48h Holter: no significant arrhythmias/pauses; c. 03/2016 MV: mild apical defect, likely breast attenuation, nl EF, low risk; d. 05/2016 Event monitor: No significant arrhythmia; e. 05/2016 Event monitor: PAF (4%).    Past Surgical History:  Procedure Laterality Date  . APPENDECTOMY    . EYE SURGERY     Glaucoma  . TOTAL ABDOMINAL HYSTERECTOMY      Family History  Problem Relation Age of Onset  . Heart attack Mother   . Glaucoma Mother   . Heart attack Father   . Parkinson's disease Brother   . Heart attack Brother     Social History:  reports that she has never smoked. She has never used smokeless tobacco. She reports that she does not drink alcohol or use drugs. Her husband died in 01/14/15. She notes that she played basketball 75 years ago on a half court. She lives in [place]. Her daughter is Sydney Mcdaniel. Her daughter can be reached at ([phone]). The patient is accompanied by Sydney Mcdaniel today.  Participants in the patient's visit and their role in the encounter included the patient, Sydney Mcdaniel, and Vito Berger, CMA, today.  The intake visit was provided by Vito Berger, CMA.   Allergies:  Allergies  Allergen Reactions  . Other   . Alphagan [Brimonidine] Itching and Rash  . Pravachol [Pravastatin Sodium] Itching and Rash  . Pravastatin Itching, Other (See Comments) and Rash    Current Medications: Current Outpatient Medications  Medication Sig Dispense Refill  . ACCU-CHEK SOFTCLIX LANCETS lancets Use as instructed; check once a day; H40.1131 100 each 12  . alendronate (FOSAMAX) 70 MG tablet TAKE ONE TABLET BY MOUTH EVERY 7 DAYS. TAKE WITH A FULL GLASS OF WATER ON AN EMPTY STOMACH. 12 tablet 0  . amLODipine (NORVASC) 5 MG tablet Take 1 tablet (5 mg total) by mouth daily. 90 tablet 1  . atorvastatin (LIPITOR) 20 MG tablet Take 1  tablet (20 mg total) by mouth daily. 90 tablet 3  . Calcium Carb-Cholecalciferol (CALCIUM 600 + D PO) Take 1 tablet by mouth daily.     . cholecalciferol (VITAMIN D) 1000 units tablet Take 1,000 Units by mouth daily.    . Cyanocobalamin (B-12) 1000 MCG/ML KIT Inject 1,000 mcg as directed every 30 (thirty) days.    . EUTHYROX 75 MCG tablet TAKE ONE TABLET BY MOUTH ON MONDAYS, WEDNESDAYS, FRIDAYS, AND SUNDAYS. 52 tablet 0  . famotidine (PEPCID) 20 MG tablet Take 1 tablet (20 mg total) by mouth at bedtime. 30 tablet 5  . glucose blood (ACCU-CHEK AVIVA PLUS) test strip Use as instructed, check once a day; H40.1131 100 each 3  . IRON PO Take 1 tablet by mouth 2 (two) times daily.     Marland Kitchen latanoprost (XALATAN) 0.005 % ophthalmic solution Place 1 drop into the right eye at bedtime.     Marland Kitchen levothyroxine (SYNTHROID) 88 MCG tablet TAKE ONE TABLET BY MOUTH ON TUESDAYS, THURSDAYS, AND SATURDAYS. 39 tablet  0  . lisinopril (ZESTRIL) 5 MG tablet Take 3 tablets (15 mg total) by mouth daily. 270 tablet 1  . Multiple Vitamins-Minerals (MULTIVITAMIN ADULT PO) Take by mouth.    . Omega-3 Fatty Acids (FISH OIL) 1200 MG CAPS Take by mouth daily.    . rivaroxaban (XARELTO) 20 MG TABS tablet Take 1 tablet (20 mg total) by mouth daily with supper. 90 tablet 2  . timolol (BETIMOL) 0.5 % ophthalmic solution Place 1 drop into the right eye 2 (two) times daily.     No current facility-administered medications for this visit.    Review of Systems  Constitutional: Positive for weight loss (3 pounds since 01/06/2019). Negative for chills, diaphoresis, fever and malaise/fatigue.       Feels "ok". Energy is fair.  HENT: Negative.  Negative for congestion, hearing loss, nosebleeds, sinus pain and sore throat.   Eyes: Negative for blurred vision.       Glaucoma.  Respiratory: Negative.  Negative for cough, hemoptysis, sputum production and shortness of breath.   Cardiovascular: Positive for leg swelling (goes down at night).  Negative for chest pain, palpitations, orthopnea and PND.       Atrial fibrillation.  Gastrointestinal: Negative.  Negative for abdominal pain, blood in stool, constipation, diarrhea, melena, nausea and vomiting.  Genitourinary: Negative.  Negative for dysuria, frequency, hematuria and urgency.  Musculoskeletal: Positive for joint pain (left hip, arthritic; improved). Negative for back pain, falls and myalgias.  Skin: Negative.  Negative for itching and rash.  Neurological: Negative.  Negative for dizziness, tremors, sensory change, focal weakness, loss of consciousness, weakness and headaches.  Endo/Heme/Allergies: Negative.  Does not bruise/bleed easily.  Psychiatric/Behavioral: Negative.  Negative for depression, memory loss and suicidal ideas. The patient is not nervous/anxious and does not have insomnia.   All other systems reviewed and are negative.  Performance status (ECOG):  1  Physical Exam  Constitutional: She is oriented to person, place, and time. She appears well-developed and well-nourished. No distress.  HENT:  Head: Normocephalic and atraumatic.  Curly gray hair.  Eyes: Conjunctivae and EOM are normal. No scleral icterus.  Blue eyes.   Neurological: She is alert and oriented to person, place, and time.  Skin: She is not diaphoretic.  Psychiatric: She has a normal mood and affect. Her behavior is normal. Judgment and thought content normal.  Nursing note reviewed.   No visits with results within 3 Day(s) from this visit.  Latest known visit with results is:  Appointment on 07/18/2019  Component Date Value Ref Range Status  . Folate 07/18/2019 28.2  >5.9 ng/mL Final   Comment: RESULTS CONFIRMED BY MANUAL DILUTION Performed at [facility], [address] [place], [place] [address]   . Ferritin 07/18/2019 74  11 - 307 ng/mL Final   Performed at [facility], [address] [place], [place] [address]  . Sodium 07/18/2019 132* 135 - 145 mmol/L  Final  . Potassium 07/18/2019 4.1  3.5 - 5.1 mmol/L Final  . Chloride 07/18/2019 100  98 - 111 mmol/L Final  . CO2 07/18/2019 23  22 - 32 mmol/L Final  . Glucose, Bld 07/18/2019 115* 70 - 99 mg/dL Final  . BUN 07/18/2019 19  8 - 23 mg/dL Final  . Creatinine, Ser 07/18/2019 0.73  0.44 - 1.00 mg/dL Final  . Calcium 07/18/2019 8.9  8.9 - 10.3 mg/dL Final  . GFR calc non Af Amer 07/18/2019 >60  >60 mL/min Final  . GFR calc Af Wyvonnia Lora  07/18/2019 >60  >60 mL/min Final  . Anion gap 07/18/2019 9  5 - 15 Final   Performed at [facility], [address], [place], [place] [address]  . WBC 07/18/2019 7.6  4.0 - 10.5 K/uL Final  . RBC 07/18/2019 3.28* 3.87 - 5.11 MIL/uL Final  . Hemoglobin 07/18/2019 10.3* 12.0 - 15.0 g/dL Final  . HCT 07/18/2019 30.9* 36.0 - 46.0 % Final  . MCV 07/18/2019 94.2  80.0 - 100.0 fL Final  . MCH 07/18/2019 31.4  26.0 - 34.0 pg Final  . MCHC 07/18/2019 33.3  30.0 - 36.0 g/dL Final  . RDW 07/18/2019 14.1  11.5 - 15.5 % Final  . Platelets 07/18/2019 165  150 - 400 K/uL Final  . nRBC 07/18/2019 0.0  0.0 - 0.2 % Final  . Neutrophils Relative % 07/18/2019 38  % Final  . Neutro Abs 07/18/2019 2.9  1.7 - 7.7 K/uL Final  . Lymphocytes Relative 07/18/2019 53  % Final  . Lymphs Abs 07/18/2019 4.1* 0.7 - 4.0 K/uL Final  . Monocytes Relative 07/18/2019 8  % Final  . Monocytes Absolute 07/18/2019 0.6  0.1 - 1.0 K/uL Final  . Eosinophils Relative 07/18/2019 1  % Final  . Eosinophils Absolute 07/18/2019 0.1  0.0 - 0.5 K/uL Final  . Basophils Relative 07/18/2019 0  % Final  . Basophils Absolute 07/18/2019 0.0  0.0 - 0.1 K/uL Final  . Immature Granulocytes 07/18/2019 0  % Final  . Abs Immature Granulocytes 07/18/2019 0.00  0.00 - 0.07 K/uL Final   Performed at [facility], [address], [place], [place] [address]    Assessment:  LUCRESIA SIMIC is a 86 y.o. female with stage 0 CLL. She presented with a distant history of anemia and recent lymphocytosis.  She was noted to have mild lymphocystosis in 06/22/2015. Absolute lymphocyte count (ALC) has ranged between 4700 - 5500.  Work-up on 07/17/2017revealed a hematocrit of 31.6, hemoglobin 11.4, MCV 89, platelets 203,000, WBC 11,00 with an ANC of 3700. Absolute lymphocyte count was 6400. Ferritin was 116. B12was 119 (low). Folate was 44.0. Retic was 0.9% (low). Uric acid was 4.5. Guaiac cards were negative x 3 in 12/2015.  She was diagnosed with B12 deficiency. She receives B12 monthly (last 07/18/2019). Folatewas 28.2 on 07/18/2019.  Peripheral blood flow cytometryrevealed involvement by a CD5+, CD23+, CD38- monoclonal B cell population with lambda light chain restriction consistent with CLL/SLL. There was a small population (8% of the T cells) of double positive (CD4+/CD8+) T cells (described in association with chronic viral infections, autoimmune disorders, chronic inflammatory disorders, and immunodeficiency states).   She has a slight progressive normocytic anemia. Work-up on 02/12/2018revealed the following normal labs: creatinine, LDH, uric acid, ferritin (105), iron studies (12% sat; TIBC 295), and folate. Retic was 1.1% (low for level of anemia). Coombs was negative on 09/04/2016. Dietis modest. She denies any melena or hematochezia. Guaiac cardswere negative x 3 in 07/2016. Last colonoscopywas >10 years ago.  Ferritinhas been followed: 116 on 01/10/2016, 105 on 08/07/2016, 29 on 11/27/2016, 31 on 03/06/2017, 59 on 07/09/2017, 64 on 11/08/2017, 98 on 05/13/2018, and 74 on 07/18/2019.  She was diagnosed with atrial fibrillationafter presenting withsyncopal episodes. She is on Xarelto.  Chest CTon 11/07/2016 revealed two 2 mm right lung nodules (right major fissure and lateral aspect RUL). There was a 5 mm right lower lobe ground-glass nodule. Chest CTon 11/05/2017 revealed 6 mm ground-glass right lower lobe nodule, stable since 11/07/2016. Follow-up  in 2  years was recommended. There were millimetric pulmonary nodules unchanged and considered benign.   Symptomatically, she feels "ok".  She denies any B symptoms.  She is working with physical therapy.  Plan: 1.   Review labs from 07/18/2019. 2.   CLL Clinically, she appears to be doing well.               She denies any fevers, sweats or weight loss.               Last exam on 01/06/2019 revealed no adenopathy or hepatosplenomegaly. Hematocrit 30.9.  Hemoglobin 10.3.  Platelets 165,000.  WBC 7600 (ANC of 2900; ALC 4100).             Discuss ongoing surveillance. 3.   Tiny pulmonary nodules Chest CT on 11/05/2017 revealed a 6 mm RLL nodule.             Anticipate 2-year follow-up imaging in 05 10/2019. 4.   B12 deficiency RTC monthly x 6 for B12 (last 07/18/2019).             Folate 28.2 on 07/18/2019   Check folate annually. 5.   Iron deficiency             Hemoglobin 10.3.  MCV 94.2.             Ferritin 74.    Dissipate additional labs on follow-up as hemoglobin is drifting down   Continue to monitor. 6.   Hyponatremia             Sodium 132.             Etiology remains unclear.             Continue free water restriction and fluids with electrolytes. 7.   RTC in 3 months for labs (CBC, retic, CMP, LDH). 8.   RTC in 6 months for MD assessment and labs (CBC with diff, BMP, ferritin, Coombs) and B12.  I discussed the assessment and treatment plan with the patient.  The patient was provided an opportunity to ask questions and all were answered.  The patient agreed with the plan and demonstrated an understanding of the instructions.  The patient was advised to call back if the symptoms worsen or if the condition fails to improve as anticipated.   Sydney Asal, MD, PhD    07/21/2019, 10:37 AM  I, Selena Batten, am acting as a scribe for Sydney Asal, MD.  I, [place] Mike Gip, MD, have reviewed the above  documentation for accuracy and completeness, and I agree with the above.

## 2019-07-20 ENCOUNTER — Encounter: Payer: Self-pay | Admitting: Hematology and Oncology

## 2019-07-20 NOTE — Progress Notes (Signed)
No new changes noted today. The patient name and DOB has been verified by phone. Patient daughter verified all medication.

## 2019-07-21 ENCOUNTER — Inpatient Hospital Stay: Payer: Medicare Other

## 2019-07-21 ENCOUNTER — Inpatient Hospital Stay (HOSPITAL_BASED_OUTPATIENT_CLINIC_OR_DEPARTMENT_OTHER): Payer: Medicare Other | Admitting: Hematology and Oncology

## 2019-07-21 ENCOUNTER — Encounter: Payer: Self-pay | Admitting: Hematology and Oncology

## 2019-07-21 DIAGNOSIS — E871 Hypo-osmolality and hyponatremia: Secondary | ICD-10-CM | POA: Diagnosis not present

## 2019-07-21 DIAGNOSIS — C9110 Chronic lymphocytic leukemia of B-cell type not having achieved remission: Secondary | ICD-10-CM | POA: Diagnosis not present

## 2019-07-21 DIAGNOSIS — E538 Deficiency of other specified B group vitamins: Secondary | ICD-10-CM

## 2019-07-21 DIAGNOSIS — R918 Other nonspecific abnormal finding of lung field: Secondary | ICD-10-CM

## 2019-07-30 ENCOUNTER — Other Ambulatory Visit
Admission: RE | Admit: 2019-07-30 | Discharge: 2019-07-30 | Disposition: A | Discharge status: Home / Self Care | Payer: Medicare Other | Source: Ambulatory Visit | Attending: Internal Medicine | Admitting: Internal Medicine

## 2019-07-30 DIAGNOSIS — I10 Essential (primary) hypertension: Secondary | ICD-10-CM | POA: Diagnosis not present

## 2019-07-30 DIAGNOSIS — Z79899 Other long term (current) drug therapy: Secondary | ICD-10-CM | POA: Insufficient documentation

## 2019-07-30 DIAGNOSIS — Z13220 Encounter for screening for lipoid disorders: Secondary | ICD-10-CM | POA: Insufficient documentation

## 2019-07-30 LAB — COMPREHENSIVE METABOLIC PANEL
ALT: 19 U/L (ref 0–44)
AST: 22 U/L (ref 15–41)
Albumin: 3.5 g/dL (ref 3.5–5.0)
Alkaline Phosphatase: 23 U/L — ABNORMAL LOW (ref 38–126)
Anion gap: 9 (ref 5–15)
BUN: 12 mg/dL (ref 8–23)
CO2: 26 mmol/L (ref 22–32)
Calcium: 9 mg/dL (ref 8.9–10.3)
Chloride: 101 mmol/L (ref 98–111)
Creatinine, Ser: 0.71 mg/dL (ref 0.44–1.00)
GFR calc Af Amer: >60 mL/min (ref >60)
GFR calc non Af Amer: >60 mL/min (ref >60)
Glucose, Bld: 110 mg/dL — ABNORMAL HIGH (ref 70–99)
Potassium: 4.4 mmol/L (ref 3.5–5.1)
Sodium: 136 mmol/L (ref 135–145)
Total Bilirubin: 0.7 mg/dL (ref 0.3–1.2)
Total Protein: 6.7 g/dL (ref 6.5–8.1)

## 2019-07-30 LAB — LIPID PANEL
Cholesterol: 153 mg/dL (ref 0–200)
HDL: 50 mg/dL (ref >40)
LDL Cholesterol: 81 mg/dL (ref 0–99)
Total CHOL/HDL Ratio: 3.1 RATIO
Triglycerides: 108 mg/dL (ref <150)
VLDL: 22 mg/dL (ref 0–40)

## 2019-08-13 ENCOUNTER — Other Ambulatory Visit: Payer: Self-pay | Admitting: Family Medicine

## 2019-08-13 DIAGNOSIS — E039 Hypothyroidism, unspecified: Secondary | ICD-10-CM

## 2019-08-13 NOTE — Telephone Encounter (Signed)
Requested medication (s) are due for refill today: yes  Requested medication (s) are on the active medication list: yes  Last refill:  04/30/2019  Future visit scheduled: no  Notes to clinic:  pt also has script for euthyrox    Requested Prescriptions  Pending Prescriptions Disp Refills   levothyroxine (SYNTHROID) 75 MCG tablet [Pharmacy Med Name: Levothyroxine Sodium 75 MCG Oral Tablet] 52 tablet 0    Sig: TAKE ONE TABLET BY MOUTH ON MONDAYS, [place], [place]      Endocrinology:  Hypothyroid Agents Failed - 08/13/2019 10:47 AM      Failed - TSH needs to be rechecked within 3 months after an abnormal result. Refill until TSH is due.      Passed - TSH in normal range and within 360 days    TSH  Date Value Ref Range Status  02/25/2019 3.349 0.350 - 4.500 uIU/mL Final    Comment:    Performed by a 3rd Generation assay with a functional sensitivity of <=0.01 uIU/mL. Performed at [facility], [address] [place], [place] [address]   01/10/2019 1.88 0.40 - 4.50 mIU/L Final          Passed - Valid encounter within last 12 months    Recent Outpatient Visits           3 months ago First degree AV block   [facility]orn, [place]   5 months ago Essential hypertension   [place], FNP   7 months ago Hyponatremia   [place], NP   11 months ago Age-related osteoporosis without current pathological fracture   [place], Satira Anis, MD   2 years ago Preventative health care   [place], Satira Anis, MD       Future Appointments             In 3 months [place], Harrell Gave, MD [facility], [facility]   In 3 months [facility], [facility], [place]   In 11 months  [facility], [facility]             Signed Prescriptions Disp Refills   levothyroxine (SYNTHROID) 88 MCG tablet 39 tablet 0    Sig: TAKE 1 TABLET BY MOUTH ON TUESDAYS, THURSDAYS, AND SATURDAYS      Endocrinology:  Hypothyroid Agents Failed - 08/13/2019 10:47 AM      Failed - TSH needs to be rechecked within 3 months after an abnormal result. Refill until TSH is due.      Passed - TSH in normal range and within 360 days    TSH  Date Value Ref Range Status  02/25/2019 3.349 0.350 - 4.500 uIU/mL Final    Comment:    Performed by a 3rd Generation assay with a functional sensitivity of <=0.01 uIU/mL. Performed at [facility], [address] [place], [place] [address]   01/10/2019 1.88 0.40 - 4.50 mIU/L Final          Passed - Valid encounter within last 12 months    Recent Outpatient Visits           3 months ago First degree AV block   [place], [place]   5 months ago Essential hypertension   [place], FNP   7 months ago Hyponatremia  [place], NP   11 months ago Age-related osteoporosis without current pathological fracture   [place], Satira Anis, MD   2 years ago Preventative health care   [place], MD       Future Appointments             In 3 months End, Harrell Gave, MD [facility], [place]   In 3 months U[place], Astrid Divine, [facility], [place]   In 11 months  [facility], [place] [facility]

## 2019-08-13 NOTE — Telephone Encounter (Signed)
Requested Prescriptions  Pending Prescriptions Disp Refills  . levothyroxine (SYNTHROID) 75 MCG tablet [Pharmacy Med Name: Levothyroxine Sodium 75 MCG Oral Tablet] 52 tablet 0    Sig: TAKE ONE TABLET BY MOUTH ON MONDAYS, WEDNESDAYS, [place]     Endocrinology:  Hypothyroid Agents Failed - 08/13/2019 10:47 AM      Failed - TSH needs to be rechecked within 3 months after an abnormal result. Refill until TSH is due.      Passed - TSH in normal range and within 360 days    TSH  Date Value Ref Range Status  02/25/2019 3.349 0.350 - 4.500 uIU/mL Final    Comment:    Performed by a 3rd Generation assay with a functional sensitivity of <=0.01 uIU/mL. Performed at [facility], [address] [place], [place] [address]   01/10/2019 1.88 0.40 - 4.50 mIU/L Final         Passed - Valid encounter within last 12 months    Recent Outpatient Visits          3 months ago First degree AV block   [place], Astrid Divine, [place]   5 months ago Essential hypertension   [place], [place], FNP   7 months ago Hyponatremia   [place], NP   11 months ago Age-related osteoporosis without current pathological fracture   [place], Satira Anis, MD   2 years ago Preventative health care   [place], Satira Anis, MD      Future Appointments            In 3 months End, Harrell Gave, MD [facility], [facility]   In 3 months [place], [facility], [place]   In 11 months  [facility], [place]           . levothyroxine (SYNTHROID) 88 MCG tablet [Pharmacy Med Name: Levothyroxine Sodium 88 MCG Oral Tablet] 39 tablet 0    Sig: TAKE 1 TABLET BY MOUTH ON TUESDAYS, THURSDAYS, [place]     Endocrinology:  Hypothyroid Agents Failed - 08/13/2019 10:47 AM      Failed - TSH needs to be  rechecked within 3 months after an abnormal result. Refill until TSH is due.      Passed - TSH in normal range and within 360 days    TSH  Date Value Ref Range Status  02/25/2019 3.349 0.350 - 4.500 uIU/mL Final    Comment:    Performed by a 3rd Generation assay with a functional sensitivity of <=0.01 uIU/mL. Performed at [facility], [address] [place], [place] [address]   01/10/2019 1.88 0.40 - 4.50 mIU/L Final         Passed - Valid encounter within last 12 months    Recent Outpatient Visits          3 months ago First degree AV block   [facility], [place]   5 months ago Essential hypertension   [place], FNP   7 months ago Hyponatremia   [place], NP   11 months ago Age-related osteoporosis without current pathological fracture   [facility] [place], Satira Anis, MD   2 years ago Preventative health care   [facility] [place], Satira Anis, MD  Future Appointments            In 3 months End, Harrell Gave, MD [facility], [place]   In 3 months Uvaldo Rising, Astrid Divine, [facility], [place]   In 11 months  [facility], [place]

## 2019-08-15 ENCOUNTER — Telehealth: Payer: Self-pay | Admitting: Family Medicine

## 2019-08-15 ENCOUNTER — Telehealth: Payer: Self-pay

## 2019-08-15 ENCOUNTER — Ambulatory Visit: Payer: Medicare Other

## 2019-08-15 DIAGNOSIS — E039 Hypothyroidism, unspecified: Secondary | ICD-10-CM

## 2019-08-15 MED ORDER — LEVOTHYROXINE SODIUM 75 MCG PO TABS: ORAL_TABLET | ORAL | 0 refills | Status: DC

## 2019-08-15 NOTE — Telephone Encounter (Signed)
Pt daughter karen calling to check status. Please advise

## 2019-08-15 NOTE — Telephone Encounter (Signed)
Need clarification on Thyroid dosage. [facility] asked for a prescription on Levothyroxine 75 mcg on M-W-F-and Sundays. However, it looks like Levothyroxine 88 mcg was sent in.

## 2019-08-15 NOTE — Telephone Encounter (Signed)
RX REFILL levothyroxine (SYNTHROID) 88 MCG tablet  [place] [address]), [place] [address]  [place], [place]) [place] [address]

## 2019-08-18 ENCOUNTER — Telehealth: Payer: Self-pay

## 2019-08-18 ENCOUNTER — Other Ambulatory Visit: Payer: Self-pay | Admitting: Emergency Medicine

## 2019-08-18 ENCOUNTER — Inpatient Hospital Stay: Payer: Medicare Other | Attending: Hematology and Oncology

## 2019-08-18 ENCOUNTER — Other Ambulatory Visit: Payer: Self-pay

## 2019-08-18 VITALS — BP 152/65 | HR 62 | Temp 98.7°F | Resp 18

## 2019-08-18 DIAGNOSIS — E538 Deficiency of other specified B group vitamins: Secondary | ICD-10-CM | POA: Insufficient documentation

## 2019-08-18 MED ORDER — CYANOCOBALAMIN 1000 MCG/ML IJ SOLN
1000.0000 ug | Freq: Once | INTRAMUSCULAR | Status: AC
  Administered 2019-08-18: 13:00:00 1000 ug via INTRAMUSCULAR

## 2019-08-18 NOTE — Telephone Encounter (Signed)
Daughter states she has been on the alternating dose for a good while and her tsh has been stable on it.

## 2019-08-18 NOTE — Telephone Encounter (Signed)
Per patient daughter. Patient take 88cg synthroid 3 days a week and 25mcg synthroid 4 days week.

## 2019-08-18 NOTE — Telephone Encounter (Signed)
Pt daughter called states the 37mcg levothyroxine has been discontinued by you.  But daughter states she was alternating the 49mcg and 54mcg.  Please review?  She will need another refill on 88

## 2019-08-19 NOTE — Telephone Encounter (Signed)
Pt's daughter states they will continue alternating since it has been working so well.

## 2019-09-12 ENCOUNTER — Ambulatory Visit: Payer: Medicare Other

## 2019-09-15 ENCOUNTER — Other Ambulatory Visit: Payer: Self-pay

## 2019-09-15 ENCOUNTER — Inpatient Hospital Stay: Payer: Medicare Other | Attending: Hematology and Oncology

## 2019-09-15 VITALS — BP 165/68 | HR 57 | Temp 97.4°F | Resp 20

## 2019-09-15 DIAGNOSIS — E538 Deficiency of other specified B group vitamins: Secondary | ICD-10-CM | POA: Diagnosis not present

## 2019-09-15 MED ORDER — CYANOCOBALAMIN 1000 MCG/ML IJ SOLN
1000.0000 ug | Freq: Once | INTRAMUSCULAR | Status: AC
  Administered 2019-09-15: 1000 ug via INTRAMUSCULAR

## 2019-09-15 NOTE — Patient Instructions (Signed)

## 2019-10-10 ENCOUNTER — Other Ambulatory Visit: Payer: Medicare Other

## 2019-10-10 ENCOUNTER — Ambulatory Visit: Payer: Medicare Other

## 2019-10-13 ENCOUNTER — Inpatient Hospital Stay: Payer: Medicare Other

## 2019-10-13 ENCOUNTER — Other Ambulatory Visit: Payer: Self-pay

## 2019-10-13 ENCOUNTER — Inpatient Hospital Stay: Payer: Medicare Other | Attending: Hematology and Oncology

## 2019-10-13 VITALS — BP 165/44 | HR 53 | Temp 96.9°F | Resp 20

## 2019-10-13 DIAGNOSIS — E538 Deficiency of other specified B group vitamins: Secondary | ICD-10-CM | POA: Diagnosis not present

## 2019-10-13 DIAGNOSIS — C9110 Chronic lymphocytic leukemia of B-cell type not having achieved remission: Secondary | ICD-10-CM | POA: Diagnosis not present

## 2019-10-13 LAB — COMPREHENSIVE METABOLIC PANEL
ALT: 18 U/L (ref 0–44)
AST: 19 U/L (ref 15–41)
Albumin: 3.9 g/dL (ref 3.5–5.0)
Alkaline Phosphatase: 25 U/L — ABNORMAL LOW (ref 38–126)
Anion gap: 7 (ref 5–15)
BUN: 19 mg/dL (ref 8–23)
CO2: 24 mmol/L (ref 22–32)
Calcium: 9 mg/dL (ref 8.9–10.3)
Chloride: 95 mmol/L — ABNORMAL LOW (ref 98–111)
Creatinine, Ser: 0.84 mg/dL (ref 0.44–1.00)
GFR calc Af Amer: >60 mL/min (ref >60)
GFR calc non Af Amer: >60 mL/min (ref >60)
Glucose, Bld: 109 mg/dL — ABNORMAL HIGH (ref 70–99)
Potassium: 4.8 mmol/L (ref 3.5–5.1)
Sodium: 126 mmol/L — ABNORMAL LOW (ref 135–145)
Total Bilirubin: 0.4 mg/dL (ref 0.3–1.2)
Total Protein: 7.2 g/dL (ref 6.5–8.1)

## 2019-10-13 LAB — CBC WITH DIFFERENTIAL/PLATELET
Abs Immature Granulocytes: 0.03 10*3/uL (ref 0.00–0.07)
Basophils Absolute: 0 10*3/uL (ref 0.0–0.1)
Basophils Relative: 0 %
Eosinophils Absolute: 0.1 10*3/uL (ref 0.0–0.5)
Eosinophils Relative: 1 %
HCT: 30.1 % — ABNORMAL LOW (ref 36.0–46.0)
Hemoglobin: 10.1 g/dL — ABNORMAL LOW (ref 12.0–15.0)
Immature Granulocytes: 0 %
Lymphocytes Relative: 57 %
Lymphs Abs: 5.4 10*3/uL — ABNORMAL HIGH (ref 0.7–4.0)
MCH: 31 pg (ref 26.0–34.0)
MCHC: 33.6 g/dL (ref 30.0–36.0)
MCV: 92.3 fL (ref 80.0–100.0)
Monocytes Absolute: 0.7 10*3/uL (ref 0.1–1.0)
Monocytes Relative: 7 %
Neutro Abs: 3.3 10*3/uL (ref 1.7–7.7)
Neutrophils Relative %: 35 %
Platelets: 185 10*3/uL (ref 150–400)
RBC: 3.26 MIL/uL — ABNORMAL LOW (ref 3.87–5.11)
RDW: 14.1 % (ref 11.5–15.5)
WBC: 9.6 10*3/uL (ref 4.0–10.5)
nRBC: 0 % (ref 0.0–0.2)

## 2019-10-13 LAB — RETICULOCYTES
Immature Retic Fract: 11.4 % (ref 2.3–15.9)
RBC.: 3.19 MIL/uL — ABNORMAL LOW (ref 3.87–5.11)
Retic Count, Absolute: 62.8 10*3/uL (ref 19.0–186.0)
Retic Ct Pct: 2 % (ref 0.4–3.1)

## 2019-10-13 LAB — LACTATE DEHYDROGENASE: LDH: 122 U/L (ref 98–192)

## 2019-10-13 MED ORDER — CYANOCOBALAMIN 1000 MCG/ML IJ SOLN
1000.0000 ug | Freq: Once | INTRAMUSCULAR | Status: AC
  Administered 2019-10-13: 1000 ug via INTRAMUSCULAR

## 2019-10-13 NOTE — Patient Instructions (Signed)

## 2019-10-14 ENCOUNTER — Encounter: Payer: Self-pay | Admitting: Family Medicine

## 2019-10-14 ENCOUNTER — Telehealth: Payer: Self-pay

## 2019-10-14 ENCOUNTER — Inpatient Hospital Stay: Payer: Medicare Other

## 2019-10-14 NOTE — Telephone Encounter (Signed)
Spoke with Ms Sydney Mcdaniel to inform her that her sodium was low and i have sent labs to the patient PCP office to reveiw. Ms Sydney Mcdaniel was understanding.

## 2019-10-17 ENCOUNTER — Telehealth: Payer: Self-pay | Admitting: Emergency Medicine

## 2019-10-17 NOTE — Telephone Encounter (Signed)
Copied from [place] [id number]. Topic: General - Inquiry >> Oct 17, 2019  9:34 AM Richardo Priest, NT wrote: Reason for CRM: Patient's daughter called in stating oncologist is supposed to fax over most recent lab results. Patient's daughter is a bit worried as sodium is low. Please advise as they would like to know what to do from here and have an appointment to follow up. Call back is [phone].

## 2019-10-17 NOTE — Telephone Encounter (Signed)
Please advise patient sodium is 126 on 4/16 labs

## 2019-10-20 NOTE — Telephone Encounter (Signed)
Pt has appt on 10-21-2019

## 2019-10-21 ENCOUNTER — Ambulatory Visit (INDEPENDENT_AMBULATORY_CARE_PROVIDER_SITE_OTHER): Payer: Medicare Other | Admitting: Family Medicine

## 2019-10-21 ENCOUNTER — Other Ambulatory Visit: Payer: Self-pay

## 2019-10-21 ENCOUNTER — Encounter: Payer: Self-pay | Admitting: Family Medicine

## 2019-10-21 VITALS — BP 160/62 | HR 64 | Temp 97.9°F | Resp 14 | Ht 62.0 in | Wt 141.8 lb

## 2019-10-21 DIAGNOSIS — E871 Hypo-osmolality and hyponatremia: Secondary | ICD-10-CM | POA: Diagnosis not present

## 2019-10-21 NOTE — Progress Notes (Signed)
Patient ID: Sydney Mcdaniel, female    DOB: 02/05/1925, 86 y.o.   MRN: [id number]  PCP: Hubbard Hartshorn, FNP  Chief Complaint  Patient presents with  . Follow-up  . Hypertension  . Hypothyroidism  . Hyperlipidemia  . abnormal labs    low sodium per hemotologist    Subjective:   Sydney Mcdaniel is a 86 y.o. female, presents to clinic with CC of the following:  Patient was referred to PCP for evaluation of hyponatremia, found by oncology/hematology with their most recent labs. Patient has had some mild hyponatremia before, sodium his range between 128 to normal 137.  She is on lisinopril 15 mg and amlodipine 5 mg which is managed by her cardiologist, Dr. Saunders Revel. She does have hypothyroid managed with levothyroxine 75 mcg 4 days a week and levothyroxine 88 mcg 3 days a week. Patient is new to me, she reports a history of syncopal episodes with a history of bradycardia.  Her last cardiology visit I did review her EKG which did show first-degree heart block with bradycardia today her heart rate is 64.  She is hypertensive.  She previously had syncopal episodes once every 6 months and her and her daughter report that they did a cardiac work-up and could not find any cause for it so they had encouraged her to push fluids stay very hydrated and add in some low calorie Gatorade.  She is careful with her elevated blood pressure to be on a low-salt diet.  She reports using a hospital picture and filling it up with water and drinking it at least 3 times a day and is only able to tolerate a small amount of low calorie grape Gatorade roughly 8 ounces.  She does have unsteady gait and balance she uses a cane and a walker, she does take her time when changing positions and weights few seconds before ambulating and this is not changed at all over the past several months.  She denies any peripheral edema, shortness of breath, orthopnea, PND, weight gain.  She is reportedly at her baseline mental status and is  not confused not feeling slowed sluggish or confused.  She does have some urinary issues had previously tried to use a pessary to help but now she just sits on the toilet for long amounts of time and waits until she empties her bladder but she does not report any increased urinary frequency urgency or incontinence denies any urinary retention or decreased urine output.  Pt also wanted to address her other chronic conditions today but since patient is new to me we have discussed addressing her hyponatremia, rechecking her thyroid and then have her follow-up later to do her chronic conditions.    Patient has normocytic anemia hemoglobin does seem to be slowly trending downward this is managed by hematology oncology  Lab Results  Component Value Date   NA 126 (L) 10/13/2019   K 4.8 10/13/2019   CO2 24 10/13/2019   GLUCOSE 109 (H) 10/13/2019   BUN 19 10/13/2019   CREATININE 0.84 10/13/2019   CALCIUM 9.0 10/13/2019   GFRNONAA >60 10/13/2019   GFRAA >60 10/13/2019    Patient Active Problem List   Diagnosis Date Noted  . Sinus bradycardia 04/09/2019  . First degree AV block 04/09/2019  . Postural dizziness with presyncope 02/25/2019  . Osteoporosis 08/21/2018  . Osteoarthritis of left hip 05/21/2018  . Bilateral carotid artery stenosis 04/19/2018  . History of syncope 10/17/2017  . MCI (mild cognitive  impairment) 08/12/2017  . Lung nodules 07/09/2017  . PAF (paroxysmal atrial fibrillation) ([place]) 04/18/2017  . Pleural effusion 10/20/2016  . Hyponatremia 10/20/2016  . Chronic diastolic congestive heart failure ([place]) 06/28/2016  . Primary open-angle glaucoma, bilateral, mild stage 05/08/2016  . Labile blood glucose 04/27/2016  . Bladder prolapse, female, acquired 02/25/2016  . 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  . B12 deficiency 01/11/2016  . Anemia 01/10/2016  . CLL (chronic lymphocytic leukemia) ([place]) 01/10/2016  . 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 Neoplasm of  uncertain behavior of skin of nose 12/21/2015  . Bradycardia 12/21/2015  . Lymphocytosis 06/25/2015  . Impaired fasting glucose 06/22/2015  . Carotid atherosclerosis 06/22/2015  . Essential hypertension 12/22/2014  . Hyperlipidemia 12/22/2014  . Hypothyroidism 12/22/2014      Current Outpatient Medications:  .  alendronate (FOSAMAX) 70 MG tablet, TAKE ONE TABLET BY MOUTH EVERY 7 DAYS. TAKE WITH A FULL GLASS OF WATER ON AN EMPTY STOMACH., Disp: 12 tablet, Rfl: 0 .  amLODipine (NORVASC) 5 MG tablet, Take 1 tablet (5 mg total) by mouth daily., Disp: 90 tablet, Rfl: 1 .  Calcium Carb-Cholecalciferol (CALCIUM 600 + D PO), Take 1 tablet by mouth daily. , Disp: , Rfl:  .  cholecalciferol (VITAMIN D) 1000 units tablet, Take 1,000 Units by mouth daily., Disp: , Rfl:  .  Cyanocobalamin (B-12) 1000 MCG/ML KIT, Inject 1,000 mcg as directed every 30 (thirty) days., Disp: , Rfl:  .  famotidine (PEPCID) 20 MG tablet, Take 1 tablet (20 mg total) by mouth at bedtime., Disp: 30 tablet, Rfl: 5 .  IRON PO, Take 1 tablet by mouth 2 (two) times daily. , Disp: , Rfl:  .  latanoprost (XALATAN) 0.005 % ophthalmic solution, Place 1 drop into the right eye at bedtime. , Disp: , Rfl:  .  levothyroxine (EUTHYROX) 75 MCG tablet, TAKE ONE TABLET BY MOUTH ON MONDAYS, WEDNESDAYS, FRIDAYS, AND SUNDAYS., Disp: 52 tablet, Rfl: 0 .  levothyroxine (SYNTHROID) 88 MCG tablet, Take 88 mcg by mouth daily before breakfast. 24mg on Tues,Thurs,Sat, Disp: , Rfl:  .  Multiple Vitamins-Minerals (MULTIVITAMIN ADULT PO), Take by mouth., Disp: , Rfl:  .  Omega-3 Fatty Acids (FISH OIL) 1200 MG CAPS, Take by mouth daily., Disp: , Rfl:  .  rivaroxaban (XARELTO) 20 MG TABS tablet, Take 1 tablet (20 mg total) by mouth daily with supper., Disp: 90 tablet, Rfl: 2 .  timolol (BETIMOL) 0.5 % ophthalmic solution, Place 1 drop into the right eye 2 (two) times daily., Disp: , Rfl:  .  ACCU-CHEK SOFTCLIX LANCETS lancets, Use as instructed; check once  a day; H40.1131, Disp: 100 each, Rfl: 12 .  atorvastatin (LIPITOR) 20 MG tablet, Take 1 tablet (20 mg total) by mouth daily., Disp: 90 tablet, Rfl: 3 .  glucose blood (ACCU-CHEK AVIVA PLUS) test strip, Use as instructed, check once a day; H40.1131, Disp: 100 each, Rfl: 3 .  lisinopril (ZESTRIL) 5 MG tablet, Take 3 tablets (15 mg total) by mouth daily., Disp: 270 tablet, Rfl: 1   Allergies  Allergen Reactions  . Other   . Alphagan [Brimonidine] Itching and Rash  . Pravachol [Pravastatin Sodium] Itching and Rash  . Pravastatin Itching, Other (See Comments) and Rash     Family History  Problem Relation Age of Onset  . Heart attack Mother   . Glaucoma Mother   . Heart attack Father   . Parkinson's disease Brother   . Heart attack Brother  Social History   Socioeconomic History  . Marital status: Widowed    Spouse name: Not on file  . Number of children: 2  . Years of education: Not on file  . Highest education level: High school graduate  Occupational History  . Occupation: retired  Tobacco Use  . Smoking status: Never Smoker  . Smokeless tobacco: Never Used  Substance and Sexual Activity  . Alcohol use: No  . Drug use: No  . Sexual activity: Not Currently  Other Topics Concern  . Not on file  Social History Narrative   Pt lives alone.    Social Determinants of Health   Financial Resource Strain: Low Risk   . Difficulty of Paying Living Expenses: Not hard at all  Food Insecurity: No Food Insecurity  . Worried About Charity fundraiser in the Last Year: Never true  . Ran Out of Food in the Last Year: Never true  Transportation Needs: No Transportation Needs  . Lack of Transportation (Medical): No  . Lack of Transportation (Non-Medical): No  Physical Activity: Sufficiently Active  . Days of Exercise per Week: 7 days  . Minutes of Exercise per Session: 30 min  Stress: No Stress Concern Present  . Feeling of Stress : Not at all  Social Connections: Somewhat  Isolated  . Frequency of Communication with Friends and Family: More than three times a week  . Frequency of Social Gatherings with Friends and Family: More than three times a week  . Attends Religious Services: More than 4 times per year  . Active Member of Clubs or Organizations: No  . Attends Archivist Meetings: Never  . Marital Status: Widowed  Intimate Partner Violence: Not At Risk  . Fear of Current or Ex-Partner: No  . Emotionally Abused: No  . Physically Abused: No  . Sexually Abused: No    Chart Review Today: I personally reviewed active problem list, medication list, allergies, family history, social history, health maintenance, notes from last encounter, lab results, imaging with the patient/caregiver today. Reviewed last EKG, cardiology visit, oncology visit  Review of Systems  Constitutional: Negative.   HENT: Negative.   Eyes: Negative.   Respiratory: Negative.   Cardiovascular: Negative.   Gastrointestinal: Negative.   Endocrine: Negative.   Genitourinary: Negative.   Musculoskeletal: Negative.   Skin: Negative.   Allergic/Immunologic: Negative.   Neurological: Negative.   Hematological: Negative.   Psychiatric/Behavioral: Negative.   All other systems reviewed and are negative.      Objective:   Vitals:   10/21/19 1347  BP: (!) 160/62  Pulse: 64  Resp: 14  Temp: 97.9 F (36.6 C)  SpO2: 98%  Weight: 141 lb 12.8 oz (64.3 kg)  Height: _0  (1.575 m)    Body mass index is 25.94 kg/m.  Physical Exam Vitals and nursing note reviewed.  Constitutional:      General: She is not in acute distress.    Appearance: She is well-developed and normal weight. She is not ill-appearing, toxic-appearing or diaphoretic.  HENT:     Head: Normocephalic and atraumatic.     Right Ear: External ear normal.     Left Ear: External ear normal.     Nose: Nose normal.     Mouth/Throat:     Mouth: Mucous membranes are moist.  Eyes:     General:         Right eye: No discharge.        Left eye: No discharge.  Conjunctiva/sclera: Conjunctivae normal.  Neck:     Trachea: No tracheal deviation.  Cardiovascular:     Rate and Rhythm: Normal rate and regular rhythm.     Pulses: Normal pulses.     Heart sounds: Normal heart sounds.  Pulmonary:     Effort: Pulmonary effort is normal. No respiratory distress.     Breath sounds: Normal breath sounds. No stridor.  Abdominal:     General: Bowel sounds are normal. There is no distension.     Palpations: Abdomen is soft.     Tenderness: There is no abdominal tenderness.  Musculoskeletal:     Right lower leg: No edema.     Left lower leg: No edema.  Skin:    General: Skin is warm and dry.     Coloration: Skin is not jaundiced or pale.     Findings: No rash.  Neurological:     Mental Status: She is alert.     Motor: No abnormal muscle tone.     Coordination: Coordination normal.  Psychiatric:        Mood and Affect: Mood normal.        Behavior: Behavior normal.      Results for orders placed or performed in visit on 10/13/19  Reticulocytes  Result Value Ref Range   Retic Ct Pct 2.0 0.4 - 3.1 %   RBC. 3.19 (L) 3.87 - 5.11 MIL/uL   Retic Count, Absolute 62.8 19.0 - 186.0 K/uL   Immature Retic Fract 11.4 2.3 - 15.9 %  Lactate dehydrogenase  Result Value Ref Range   LDH 122 98 - 192 U/L  Comprehensive metabolic panel  Result Value Ref Range   Sodium 126 (L) 135 - 145 mmol/L   Potassium 4.8 3.5 - 5.1 mmol/L   Chloride 95 (L) 98 - 111 mmol/L   CO2 24 22 - 32 mmol/L   Glucose, Bld 109 (H) 70 - 99 mg/dL   BUN 19 8 - 23 mg/dL   Creatinine, Ser 0.84 0.44 - 1.00 mg/dL   Calcium 9.0 8.9 - 10.3 mg/dL   Total Protein 7.2 6.5 - 8.1 g/dL   Albumin 3.9 3.5 - 5.0 g/dL   AST 19 15 - 41 U/L   ALT 18 0 - 44 U/L   Alkaline Phosphatase 25 (L) 38 - 126 U/L   Total Bilirubin 0.4 0.3 - 1.2 mg/dL   GFR calc non Af Amer >60 >60 mL/min   GFR calc Af Amer >60 >60 mL/min   Anion gap 7 5 - 15  CBC  with Differential/Platelet  Result Value Ref Range   WBC 9.6 4.0 - 10.5 K/uL   RBC 3.26 (L) 3.87 - 5.11 MIL/uL   Hemoglobin 10.1 (L) 12.0 - 15.0 g/dL   HCT 30.1 (L) 36.0 - 46.0 %   MCV 92.3 80.0 - 100.0 fL   MCH 31.0 26.0 - 34.0 pg   MCHC 33.6 30.0 - 36.0 g/dL   RDW 14.1 11.5 - 15.5 %   Platelets 185 150 - 400 K/uL   nRBC 0.0 0.0 - 0.2 %   Neutrophils Relative % 35 %   Neutro Abs 3.3 1.7 - 7.7 K/uL   Lymphocytes Relative 57 %   Lymphs Abs 5.4 (H) 0.7 - 4.0 K/uL   Monocytes Relative 7 %   Monocytes Absolute 0.7 0.1 - 1.0 K/uL   Eosinophils Relative 1 %   Eosinophils Absolute 0.1 0.0 - 0.5 K/uL   Basophils Relative 0 %   Basophils Absolute  0.0 0.0 - 0.1 K/uL   WBC Morphology REVIEWED    Immature Granulocytes 0 %   Abs Immature Granulocytes 0.03 0.00 - 0.07 K/uL        Assessment & Plan:      ICD-10-CM   1. Hyponatremia  E87.1 TSH    COMPLETE METABOLIC PANEL WITH GFR    Osmolality, urine    Sodium, urine, random    Osmolality    Orthostatic vital signs    DG Chest 2 View    Patient is here for follow-up of hyponatremia from labs noted at the cancer center.  I did review her labs she does frequently have hyponatremia but this is lower than her trends in the recent past.  She does express that she is drinking very large pitchers of water multiple times a day and she has been instructed to do this to keep up her blood pressure because of past syncopal episodes which was worked up by multiple specialist and had no other known cause that she had about 2 syncopal episodes a year and she has been try to manage this with increasing fluids she is only drinking a small amount of electrolytes.   Patient does not have any third spacing and does not appear hypervolemic today.  She was not orthostatic, and her blood pressure was elevated. She is at her baseline mental status is not confused at all, lungs were clear to auscultation.  We will recheck labs Patient encouraged to decrease  her fluids of free water and increase her fluids with some salt her electrolytes just slightly.  Patient may need to return for further work-up pending results today.     Delsa Grana, PA-C 10/21/19 2:01 PM

## 2019-10-21 NOTE — Patient Instructions (Addendum)
While I get your labs rechecked - I would cut back on the amount of water you are drinking.    See if you can find out how much is in your container holds  Average needed fluid and water intake is around 2000 mLs a day - if you are drinking 2-3x this amount, I suggest cutting back by 1/3 to 1/2   We will know more after checking your labs today  Monitor your BP if having symptoms, feeling like you will pass out or any concerns that your blood pressure is high.  I am ok with your systolic blood pressure [id number]  Wear compression hose and keep drinking some electrolytes or gatorade  Hyponatremia Hyponatremia is when the amount of salt (sodium) in your blood is too low. When salt levels are low, your body may take in extra water. This can cause swelling throughout the body. The swelling often affects the brain. What are the causes? This condition may be caused by:  Certain medical problems or conditions.  Vomiting a lot.  Having watery poop (diarrhea) often.  Certain medicines or illegal drugs.  Not having enough water in the body (dehydration).  Drinking too much water.  Eating a diet that is low in salt.  Large burns on your body.  Too much sweating. What increases the risk? You are more likely to get this condition if you:  Have long-term (chronic) kidney disease.  Have heart failure.  Have a medical condition that causes you to have watery poop often.  Do very hard exercises.  Take medicines that affect the amount of salt is in your blood. What are the signs or symptoms? Symptoms of this condition include:  Headache.  Feeling like you may vomit (nausea).  Vomiting.  Being very tired (lethargic).  Muscle weakness and cramps.  Not wanting to eat as much as normal (loss of appetite).  Feeling weak or light-headed. Severe symptoms of this condition include:  Confusion.  Feeling restless (agitation).  Having a fast heart rate.  Passing out  (fainting).  Seizures.  Coma. How is this treated? Treatment for this condition depends on the cause. Treatment may include:  Getting fluids through an IV tube that is put into one of your veins.  Taking medicines to fix the salt levels in your blood. If medicines are causing the problem, your medicines will need to be changed.  Limiting how much water or fluid you take in.  Monitoring in the hospital to watch your symptoms. Follow these instructions at home:   Take over-the-counter and prescription medicines only as told by your doctor. Many medicines can make this condition worse. Talk with your doctor about any medicines that you are taking.  Eat and drink exactly as you are told by your doctor. ? Eat only the foods you are told to eat. ? Limit how much fluid you take.  Do not drink alcohol.  Keep all follow-up visits as told by your doctor. This is important. Contact a doctor if:  You feel more like you may vomit.  You feel more tired.  Your headache gets worse.  You feel more confused.  You feel weaker.  Your symptoms go away and then they come back.  You have trouble following the diet instructions. Get help right away if:  You have a seizure.  You pass out.  You keep having watery poop.  You keep vomiting. Summary  Hyponatremia is when the amount of salt in your blood is too low.  When salt levels are low, you can have swelling throughout the body. The swelling mostly affects the brain.  Treatment depends on the cause. Treatment may include getting IV fluids, medicines, or not drinking as much fluid. This information is not intended to replace advice given to you by your health care provider. Make sure you discuss any questions you have with your health care provider. Document Revised: 08/29/2018 Document Reviewed: 05/16/2018 Elsevier Patient Education  [employer].

## 2019-10-23 LAB — COMPLETE METABOLIC PANEL WITH GFR
AG Ratio: 1.5 (calc) (ref 1.0–2.5)
ALT: 15 U/L (ref 6–29)
AST: 18 U/L (ref 10–35)
Albumin: 4.2 g/dL (ref 3.6–5.1)
Alkaline phosphatase (APISO): 25 U/L — ABNORMAL LOW (ref 37–153)
BUN: 16 mg/dL (ref 7–25)
CO2: 25 mmol/L (ref 20–32)
Calcium: 9.4 mg/dL (ref 8.6–10.4)
Chloride: 101 mmol/L (ref 98–110)
Creat: 0.72 mg/dL (ref 0.60–0.88)
GFR, Est African American: 84 mL/min/{1.73_m2} (ref >=60)
GFR, Est Non African American: 73 mL/min/{1.73_m2} (ref >=60)
Globulin: 2.8 g/dL (calc) (ref 1.9–3.7)
Glucose, Bld: 96 mg/dL (ref 65–99)
Potassium: 4.2 mmol/L (ref 3.5–5.3)
Sodium: 134 mmol/L — ABNORMAL LOW (ref 135–146)
Total Bilirubin: 0.4 mg/dL (ref 0.2–1.2)
Total Protein: 7 g/dL (ref 6.1–8.1)

## 2019-10-23 LAB — OSMOLALITY: Osmolality: 283 mOsm/kg (ref 278–305)

## 2019-10-23 LAB — OSMOLALITY, URINE: Osmolality, Ur: 201 mOsm/kg (ref 50–1200)

## 2019-10-23 LAB — SODIUM, URINE, RANDOM: Sodium, Ur: 38 mmol/L (ref 28–272)

## 2019-10-23 LAB — TSH: TSH: 3.03 mIU/L (ref 0.40–4.50)

## 2019-10-27 ENCOUNTER — Other Ambulatory Visit: Payer: Self-pay | Admitting: Internal Medicine

## 2019-10-27 DIAGNOSIS — I951 Orthostatic hypotension: Secondary | ICD-10-CM

## 2019-10-27 DIAGNOSIS — I480 Paroxysmal atrial fibrillation: Secondary | ICD-10-CM

## 2019-10-27 DIAGNOSIS — I10 Essential (primary) hypertension: Secondary | ICD-10-CM

## 2019-10-28 ENCOUNTER — Other Ambulatory Visit: Payer: Self-pay

## 2019-10-28 ENCOUNTER — Encounter: Payer: Self-pay | Admitting: Family Medicine

## 2019-10-28 DIAGNOSIS — I480 Paroxysmal atrial fibrillation: Secondary | ICD-10-CM

## 2019-10-28 DIAGNOSIS — I10 Essential (primary) hypertension: Secondary | ICD-10-CM

## 2019-10-28 DIAGNOSIS — I951 Orthostatic hypotension: Secondary | ICD-10-CM

## 2019-10-28 MED ORDER — AMLODIPINE BESYLATE 5 MG PO TABS: 5.0000 mg | ORAL_TABLET | Freq: Every day | ORAL | 1 refills | Status: DC

## 2019-10-28 MED ORDER — ATORVASTATIN CALCIUM 20 MG PO TABS: 20.0000 mg | ORAL_TABLET | Freq: Every day | ORAL | 1 refills | Status: DC

## 2019-11-04 ENCOUNTER — Other Ambulatory Visit: Payer: Self-pay

## 2019-11-04 ENCOUNTER — Ambulatory Visit (INDEPENDENT_AMBULATORY_CARE_PROVIDER_SITE_OTHER): Payer: Medicare Other | Admitting: Family Medicine

## 2019-11-04 ENCOUNTER — Encounter: Payer: Self-pay | Admitting: Family Medicine

## 2019-11-04 VITALS — BP 136/62 | HR 57 | Temp 97.8°F | Resp 14 | Ht 62.0 in | Wt 138.8 lb

## 2019-11-04 DIAGNOSIS — R269 Unspecified abnormalities of gait and mobility: Secondary | ICD-10-CM | POA: Diagnosis not present

## 2019-11-04 DIAGNOSIS — E039 Hypothyroidism, unspecified: Secondary | ICD-10-CM | POA: Diagnosis not present

## 2019-11-04 DIAGNOSIS — E871 Hypo-osmolality and hyponatremia: Secondary | ICD-10-CM

## 2019-11-04 LAB — BASIC METABOLIC PANEL WITH GFR
BUN: 17 mg/dL (ref 7–25)
CO2: 25 mmol/L (ref 20–32)
Calcium: 9.6 mg/dL (ref 8.6–10.4)
Chloride: 102 mmol/L (ref 98–110)
Creat: 0.75 mg/dL (ref 0.60–0.88)
GFR, Est African American: 80 mL/min/{1.73_m2} (ref >=60)
GFR, Est Non African American: 69 mL/min/{1.73_m2} (ref >=60)
Glucose, Bld: 101 mg/dL — ABNORMAL HIGH (ref 65–99)
Potassium: 5.2 mmol/L (ref 3.5–5.3)
Sodium: 135 mmol/L (ref 135–146)

## 2019-11-04 NOTE — Progress Notes (Signed)
Patient ID: Sydney Mcdaniel, female    DOB: 1925/02/11, 86 y.o.   MRN: [id number]  PCP: Hubbard Hartshorn, FNP  Chief Complaint  Patient presents with  . Follow-up    hyponatremia    Subjective:   Sydney Mcdaniel is a 86 y.o. female, presents to clinic to f/up on hyponatremia, labs were improved previously.   Pt did measure her pitcher at home, she reports it was around 28 ounces that she was drinking 3x a day roughly 2-2.5L water, and she reduced the water and increased other drinks - low sugar gaterade and other electrolyte drinks.  She also reports drinking cranberry juice V8 orange juice with iron supplement   One episode in the past week of feeling jittery, since decreasing how much free water she has been drinking she has not had any LE edema, syncope episodes. HTN - managed by cardiology, BP well controlled today BP Readings from Last 3 Encounters:  11/04/19 136/62  10/21/19 (!) 160/62  10/13/19 (!) 165/44    Hypothyroid on 75 mcg 4x a week and 88 mcg 3x a week. Lab Results  Component Value Date   TSH 3.03 10/21/2019   Over the past couple years TSH has been 0.68-3.349  Pts gait is not as good as it used to, she used to have better strength and mobility.  Now she holds onto things at home to get around.  She would like to do PT again, previously had PT and some HH    Patient Active Problem List   Diagnosis Date Noted  . Sinus bradycardia 04/09/2019  . First degree AV block 04/09/2019  . Postural dizziness with presyncope 02/25/2019  . Osteoporosis 08/21/2018  . Osteoarthritis of left hip 05/21/2018  . Bilateral carotid artery stenosis 04/19/2018  . History of syncope 10/17/2017  . MCI (mild cognitive impairment) 08/12/2017  . Lung nodules 07/09/2017  . PAF (paroxysmal atrial fibrillation) ([place]) 04/18/2017  . Pleural effusion 10/20/2016  . Hyponatremia 10/20/2016  . Chronic diastolic congestive heart failure ([place]) 06/28/2016  . Primary open-angle glaucoma,  bilateral, mild stage 05/08/2016  . Labile blood glucose 04/27/2016  . Bladder prolapse, female, acquired 02/25/2016  . 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  . B12 deficiency 01/11/2016  . Anemia 01/10/2016  . CLL (chronic lymphocytic leukemia) ([place]) 01/10/2016  . 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 12/21/2015  . Bradycardia 12/21/2015  . Lymphocytosis 06/25/2015  . Impaired fasting glucose 06/22/2015  . Carotid atherosclerosis 06/22/2015  . Essential hypertension 12/22/2014  . Hyperlipidemia 12/22/2014  . Hypothyroidism 12/22/2014      Current Outpatient Medications:  .  ACCU-CHEK SOFTCLIX LANCETS lancets, Use as instructed; check once a day; H40.1131, Disp: 100 each, Rfl: 12 .  alendronate (FOSAMAX) 70 MG tablet, TAKE ONE TABLET BY MOUTH EVERY 7 DAYS. TAKE WITH A FULL GLASS OF WATER ON AN EMPTY STOMACH., Disp: 12 tablet, Rfl: 0 .  amLODipine (NORVASC) 5 MG tablet, Take 1 tablet (5 mg total) by mouth daily., Disp: 90 tablet, Rfl: 1 .  atorvastatin (LIPITOR) 20 MG tablet, Take 1 tablet (20 mg total) by mouth daily., Disp: 90 tablet, Rfl: 1 .  Calcium Carb-Cholecalciferol (CALCIUM 600 + D PO), Take 1 tablet by mouth daily. , Disp: , Rfl:  .  cholecalciferol (VITAMIN D) 1000 units tablet, Take 1,000 Units by mouth daily., Disp: , Rfl:  .  Cyanocobalamin (B-12) 1000 MCG/ML KIT, Inject 1,000 mcg as  directed every 30 (thirty) days., Disp: , Rfl:  .  famotidine (PEPCID) 20 MG tablet, Take 1 tablet (20 mg total) by mouth at bedtime., Disp: 30 tablet, Rfl: 5 .  glucose blood (ACCU-CHEK AVIVA PLUS) test strip, Use as instructed, check once a day; H40.1131, Disp: 100 each, Rfl: 3 .  IRON PO, Take 1 tablet by mouth 2 (two) times daily. , Disp: , Rfl:  .  latanoprost (XALATAN) 0.005 % ophthalmic solution, Place 1 drop into the right eye at bedtime. , Disp: , Rfl:  .  levothyroxine (EUTHYROX) 75 MCG tablet, TAKE ONE TABLET BY MOUTH ON  MONDAYS, WEDNESDAYS, FRIDAYS, AND SUNDAYS., Disp: 52 tablet, Rfl: 0 .  levothyroxine (SYNTHROID) 88 MCG tablet, Take 88 mcg by mouth daily before breakfast. 38mg on Tues,Thurs,Sat, Disp: , Rfl:  .  lisinopril (ZESTRIL) 5 MG tablet, Take 3 tablets (15 mg total) by mouth daily., Disp: 270 tablet, Rfl: 1 .  Multiple Vitamins-Minerals (MULTIVITAMIN ADULT PO), Take by mouth., Disp: , Rfl:  .  Omega-3 Fatty Acids (FISH OIL) 1200 MG CAPS, Take by mouth daily., Disp: , Rfl:  .  rivaroxaban (XARELTO) 20 MG TABS tablet, Take 1 tablet (20 mg total) by mouth daily with supper., Disp: 90 tablet, Rfl: 2 .  timolol (BETIMOL) 0.5 % ophthalmic solution, Place 1 drop into the right eye 2 (two) times daily., Disp: , Rfl:    Allergies  Allergen Reactions  . Other   . Alphagan [Brimonidine] Itching and Rash  . Pravachol [Pravastatin Sodium] Itching and Rash  . Pravastatin Itching, Other (See Comments) and Rash     Family History  Problem Relation Age of Onset  . Heart attack Mother   . Glaucoma Mother   . Heart attack Father   . Parkinson's disease Brother   . Heart attack Brother      Social History   Socioeconomic History  . Marital status: Widowed    Spouse name: Not on file  . Number of children: 2  . Years of education: Not on file  . Highest education level: High school graduate  Occupational History  . Occupation: retired  Tobacco Use  . Smoking status: Never Smoker  . Smokeless tobacco: Never Used  Substance and Sexual Activity  . Alcohol use: No  . Drug use: No  . Sexual activity: Not Currently  Other Topics Concern  . Not on file  Social History Narrative   Pt lives alone.    Social Determinants of Health   Financial Resource Strain: Low Risk   . Difficulty of Paying Living Expenses: Not hard at all  Food Insecurity: No Food Insecurity  . Worried About RCharity fundraiserin the Last Year: Never true  . Ran Out of Food in the Last Year: Never true  Transportation Needs:  No Transportation Needs  . Lack of Transportation (Medical): No  . Lack of Transportation (Non-Medical): No  Physical Activity: Sufficiently Active  . Days of Exercise per Week: 7 days  . Minutes of Exercise per Session: 30 min  Stress: No Stress Concern Present  . Feeling of Stress : Not at all  Social Connections: Somewhat Isolated  . Frequency of Communication with Friends and Family: More than three times a week  . Frequency of Social Gatherings with Friends and Family: More than three times a week  . Attends Religious Services: More than 4 times per year  . Active Member of Clubs or Organizations: No  . Attends CArchivistMeetings:  Never  . Marital Status: Widowed  Intimate Partner Violence: Not At Risk  . Fear of Current or Ex-Partner: No  . Emotionally Abused: No  . Physically Abused: No  . Sexually Abused: No    Chart Review Today: I personally reviewed active problem list, medication list, allergies, family history, social history, health maintenance, notes from last encounter, lab results, imaging with the patient/caregiver today.   Review of Systems 10 Systems reviewed and are negative for acute change except as noted in the HPI.     Objective:   Vitals:   11/04/19 0846  BP: 136/62  Pulse: (!) 57  Resp: 14  Temp: 97.8 F (36.6 C)  SpO2: 99%  Weight: 138 lb 12.8 oz (63 kg)  Height: '5\' 2"'  (1.575 m)    Body mass index is 25.39 kg/m.  Physical Exam Vitals and nursing note reviewed.  Constitutional:      General: She is not in acute distress.    Appearance: She is well-developed and normal weight. She is not ill-appearing, toxic-appearing or diaphoretic.  HENT:     Head: Normocephalic and atraumatic.     Right Ear: External ear normal.     Left Ear: External ear normal.     Nose: Nose normal.     Mouth/Throat:     Mouth: Mucous membranes are moist.  Eyes:     General:        Right eye: No discharge.        Left eye: No discharge.      Conjunctiva/sclera: Conjunctivae normal.  Neck:     Trachea: No tracheal deviation.  Cardiovascular:     Rate and Rhythm: Regular rhythm. Bradycardia present.     Pulses: Normal pulses.     Heart sounds: Normal heart sounds.  Pulmonary:     Effort: Pulmonary effort is normal. No respiratory distress.     Breath sounds: Normal breath sounds. No stridor.  Abdominal:     General: Bowel sounds are normal. There is no distension.     Palpations: Abdomen is soft.     Tenderness: There is no abdominal tenderness.  Musculoskeletal:     Right lower leg: No edema.     Left lower leg: No edema.  Skin:    General: Skin is warm and dry.     Coloration: Skin is not jaundiced or pale.     Findings: No rash.  Neurological:     Mental Status: She is alert. Mental status is at baseline.     Motor: No abnormal muscle tone.     Coordination: Coordination abnormal.     Gait: Gait abnormal.  Psychiatric:        Mood and Affect: Mood normal.        Behavior: Behavior normal.      Results for orders placed or performed in visit on 10/21/19  TSH  Result Value Ref Range   TSH 3.03 0.40 - 4.50 mIU/L  COMPLETE METABOLIC PANEL WITH GFR  Result Value Ref Range   Glucose, Bld 96 65 - 99 mg/dL   BUN 16 7 - 25 mg/dL   Creat 0.72 0.60 - 0.88 mg/dL   GFR, Est Non African American 73 > OR = 60 mL/min/1.4m   GFR, Est African American 84 > OR = 60 mL/min/1.746m  BUN/Creatinine Ratio NOT APPLICABLE 6 - 22 (calc)   Sodium 134 (L) 135 - 146 mmol/L   Potassium 4.2 3.5 - 5.3 mmol/L   Chloride 101 98 -  110 mmol/L   CO2 25 20 - 32 mmol/L   Calcium 9.4 8.6 - 10.4 mg/dL   Total Protein 7.0 6.1 - 8.1 g/dL   Albumin 4.2 3.6 - 5.1 g/dL   Globulin 2.8 1.9 - 3.7 g/dL (calc)   AG Ratio 1.5 1.0 - 2.5 (calc)   Total Bilirubin 0.4 0.2 - 1.2 mg/dL   Alkaline phosphatase (APISO) 25 (L) 37 - 153 U/L   AST 18 10 - 35 U/L   ALT 15 6 - 29 U/L  Osmolality, urine  Result Value Ref Range   Osmolality, Ur 201 50 - 1,200  mOsm/kg  Sodium, urine, random  Result Value Ref Range   Sodium, Ur 38 28 - 272 mmol/L  Osmolality  Result Value Ref Range   Osmolality 283 278 - 305 mOsm/kg        Assessment & Plan:      ICD-10-CM   1. Hyponatremia  G99.2 BASIC METABOLIC PANEL WITH GFR   monitoring sodium, low with recent oncology labs, decreased free water intake, increased other liquid w electrolytes, recheck today  2. Hypothyroidism, unspecified type  E03.9 levothyroxine (EUTHYROX) 75 MCG tablet    levothyroxine (SYNTHROID) 88 MCG tablet   reviewed recent labs with pt, PCP out of office, refills sent on thyroid meds with 75 mcg 4 x a week and 88 mcg 3x a week  3. Abnormality of gait and mobility  R26.9 Ambulatory referral to Physical Therapy   refer to PT for eval of gait and mobility, pt notes she used to be stronger        Delsa Grana, PA-C 11/04/19 9:08 AM

## 2019-11-06 ENCOUNTER — Encounter: Payer: Self-pay | Admitting: Hematology and Oncology

## 2019-11-07 ENCOUNTER — Ambulatory Visit: Payer: Medicare Other

## 2019-11-07 ENCOUNTER — Ambulatory Visit
Admission: RE | Admit: 2019-11-07 | Discharge: 2019-11-07 | Disposition: A | Discharge status: Home / Self Care | Payer: Medicare Other | Source: Ambulatory Visit | Attending: Hematology and Oncology | Admitting: Hematology and Oncology

## 2019-11-07 ENCOUNTER — Other Ambulatory Visit: Payer: Self-pay

## 2019-11-07 DIAGNOSIS — C9110 Chronic lymphocytic leukemia of B-cell type not having achieved remission: Secondary | ICD-10-CM

## 2019-11-07 DIAGNOSIS — R918 Other nonspecific abnormal finding of lung field: Secondary | ICD-10-CM | POA: Insufficient documentation

## 2019-11-10 ENCOUNTER — Inpatient Hospital Stay: Payer: Medicare Other | Attending: Hematology and Oncology

## 2019-11-10 ENCOUNTER — Other Ambulatory Visit: Payer: Self-pay

## 2019-11-10 VITALS — BP 174/50 | HR 60 | Resp 18

## 2019-11-10 DIAGNOSIS — E538 Deficiency of other specified B group vitamins: Secondary | ICD-10-CM | POA: Insufficient documentation

## 2019-11-10 MED ORDER — CYANOCOBALAMIN 1000 MCG/ML IJ SOLN
1000.0000 ug | Freq: Once | INTRAMUSCULAR | Status: AC
  Administered 2019-11-10: 1000 ug via INTRAMUSCULAR

## 2019-11-10 NOTE — Progress Notes (Signed)
[facility]  [address], [address] [place], [place] [address] Phone: [phone]  Fax: [phone]  Telemedicine Office Visit:  11/12/2019  Referring physician: Hubbard Hartshorn, FNP  I connected with Sydney Mcdaniel on 11/12/19 at 3:44 PM by videoconferencing and verified that I was speaking with the correct person using 2 identifiers.  The patient was at home.  I discussed the limitations, risk, security and privacy concerns of performing an evaluation and management service by videoconferencing and the availability of in person appointments.  I also discussed with the patient that there may be a patient responsible charge related to this service.  The patient expressed understanding and agreed to proceed.   Chief Complaint: Sydney Mcdaniel is a 86 y.o. female with chronic lymphocytic leukemia, B12 deficiency, and anemia who is seen for 4 month assessment.    HPI: The patient was last seen in the hematology clinic on 07/21/2019 via telemedicine. At that time, she felt "ok".  She denied any B symptoms.  Hematocrit was 30.9, hemoglobin 10.3, MCV 94.2, platelets 165,000, WBC 7600 (ANC 2900; ALC 4100).  Folate was 28.2 on 07/18/2019.  She received B-12 injections monthly (08/18/2019 - 11/10/2019).   Chest CT on 11/07/2019 revealed stable appearance of small scattered lung nodules measuring up to 5 mm. Given the stability of these lung nodules these most likely represent a benign process and no further follow-up is recommended at this time. There was aortic atherosclerosis, 3 vessel coronary artery disease, and gallstones.  Labs on 10/13/2019 revealed a hematocrit of 30.1, hemoglobin 10.1, MCV 92.3, platelets 185,000, WBC 9600 (ANC 3300; ALC 5400). LDH was 122.  Retic was 2%.  TSH was 3.03 on 10/21/2019. Creatinine was 0.75 on 11/04/2019.  During the interim, she has felt 'pretty good'. Her weight has been stable. Her energy levels have been low and 'bad'. She has had no  atrial fibrillation episodes recently. Her lisinopril medication dosage was increased. Her left hip is no longer bothering her. She denies any bruising, lumps, or bumps. She has been experiencing some back pain recently. She takes two iron pills a day.    Past Medical History:  Diagnosis Date  . Allergy   . Anemia   . Cataract   . CLL (chronic lymphocytic leukemia) ([place])   . Glaucoma   . H/O: hysterectomy    Total  . Hyperlipidemia   . Hypertension   . Hypothyroidism   . Impaired fasting glucose   . Lichen sclerosus   . Osteoporosis    Hips  . PAF (paroxysmal atrial fibrillation) ([place])    a. 06/15/2016 Event monitor: 4% afib burden; b. CHA2DS2VASc - 4-->Xarelto.  . Syncope    a. 03/2016 Echo: EF55-60%, no rwma, mild AI/MR, nl PASP; b. 03/2016 48h Holter: no significant arrhythmias/pauses; c. 03/2016 MV: mild apical defect, likely breast attenuation, nl EF, low risk; d. 05/2016 Event monitor: No significant arrhythmia; e. 05/2016 Event monitor: PAF (4%).    Past Surgical History:  Procedure Laterality Date  . APPENDECTOMY    . EYE SURGERY     Glaucoma  . TOTAL ABDOMINAL HYSTERECTOMY      Family History  Problem Relation Age of Onset  . Heart attack Mother   . Glaucoma Mother   . Heart attack Father   . Parkinson's disease Brother   . Heart attack Brother     Social History:  reports that she has never smoked. She has never used smokeless tobacco. She reports that she does  not drink alcohol or use drugs. Her husband died in 2014/12/20. She notes that she played basketball 75 years ago on a half court. She lives in [place]. Her daughter is Sydney Mcdaniel. Her daughter can be reached at ([phone]). The patient is accompanied by her daughter Sydney Mcdaniel today.  Participants in the patient's visit and their role in the encounter included the patient, Heywood Footman, and Orlene Och, Therapist, sports, today.  The intake visit was provided by Orlene Och, RN.   Allergies:  Allergies  Allergen  Reactions  . Other   . Alphagan [Brimonidine] Itching and Rash  . Pravachol [Pravastatin Sodium] Itching and Rash  . Pravastatin Itching, Other (See Comments) and Rash    Current Medications: Current Outpatient Medications  Medication Sig Dispense Refill  . ACCU-CHEK SOFTCLIX LANCETS lancets Use as instructed; check once a day; H40.1131 100 each 12  . alendronate (FOSAMAX) 70 MG tablet TAKE ONE TABLET BY MOUTH EVERY 7 DAYS. TAKE WITH A FULL GLASS OF WATER ON AN EMPTY STOMACH. 12 tablet 0  . amLODipine (NORVASC) 5 MG tablet Take 1 tablet (5 mg total) by mouth daily. 90 tablet 1  . atorvastatin (LIPITOR) 20 MG tablet Take 1 tablet (20 mg total) by mouth daily. 90 tablet 1  . Calcium Carb-Cholecalciferol (CALCIUM 600 + D PO) Take 1 tablet by mouth daily.     . cholecalciferol (VITAMIN D) 1000 units tablet Take 1,000 Units by mouth daily.    . Cyanocobalamin (B-12) 1000 MCG/ML KIT Inject 1,000 mcg as directed every 30 (thirty) days.    . famotidine (PEPCID) 20 MG tablet Take 1 tablet (20 mg total) by mouth at bedtime. 30 tablet 5  . glucose blood (ACCU-CHEK AVIVA PLUS) test strip Use as instructed, check once a day; H40.1131 100 each 3  . IRON PO Take 1 tablet by mouth 2 (two) times daily.     Marland Kitchen latanoprost (XALATAN) 0.005 % ophthalmic solution Place 1 drop into the right eye at bedtime.     Marland Kitchen levothyroxine (EUTHYROX) 75 MCG tablet TAKE ONE TABLET BY MOUTH ON MONDAYS, WEDNESDAYS, FRIDAYS, AND SUNDAYS. 52 tablet 0  . levothyroxine (SYNTHROID) 88 MCG tablet Take 88 mcg by mouth daily before breakfast. 48mg on Tues,Thurs,Sat    . lisinopril (ZESTRIL) 20 MG tablet Take 1 tablet (20 mg total) by mouth daily. 90 tablet 2  . Multiple Vitamins-Minerals (MULTIVITAMIN ADULT PO) Take by mouth.    . Omega-3 Fatty Acids (FISH OIL) 1200 MG CAPS Take by mouth daily.    . rivaroxaban (XARELTO) 20 MG TABS tablet Take 1 tablet (20 mg total) by mouth daily with supper. 90 tablet 2  . timolol (BETIMOL) 0.5 %  ophthalmic solution Place 1 drop into the right eye 2 (two) times daily.     No current facility-administered medications for this visit.    Review of Systems  Constitutional: Negative for chills, diaphoresis, fever, malaise/fatigue and weight loss (stable).       Feels "ok". Energy is fair.  HENT: Negative.  Negative for congestion, hearing loss, nosebleeds, sinus pain and sore throat.   Eyes: Negative for blurred vision.       Glaucoma.  Respiratory: Negative.  Negative for cough, hemoptysis, sputum production and shortness of breath.   Cardiovascular: Positive for leg swelling (goes down at night). Negative for chest pain, palpitations, orthopnea and PND.       Atrial fibrillation.  Gastrointestinal: Negative.  Negative for abdominal pain, blood in stool, constipation, diarrhea, heartburn, melena, nausea and  vomiting.  Genitourinary: Negative.  Negative for dysuria, frequency, hematuria and urgency.  Musculoskeletal: Positive for back pain and joint pain (left hip, arthritic; improved). Negative for falls and myalgias.  Skin: Negative.  Negative for itching and rash.  Neurological: Negative.  Negative for dizziness, tingling, tremors, focal weakness, loss of consciousness, weakness and headaches.  Endo/Heme/Allergies: Negative.  Does not bruise/bleed easily.  Psychiatric/Behavioral: Negative.  Negative for depression, memory loss and suicidal ideas. The patient is not nervous/anxious and does not have insomnia.   All other systems reviewed and are negative.  Performance status (ECOG): 1 - Symptomatic but completely ambulatory   Physical Exam Nursing note reviewed.  Constitutional:      General: She is not in acute distress.    Appearance: She is well-developed and well-nourished.  HENT:     Head: Normocephalic and atraumatic.     Mouth/Throat:     Mouth: Oropharynx is clear and moist.      Comments: Curly gray hair. Eyes:     General: No scleral icterus.    Extraocular  Movements: EOM normal.     Conjunctiva/sclera: Conjunctivae normal.     Comments: Glasses.  Blue eyes.   Neurological:     Mental Status: She is alert and oriented to person, place, and time.  Psychiatric:        Mood and Affect: Mood and affect normal.        Behavior: Behavior normal.        Thought Content: Thought content normal.        Judgment: Judgment normal.    No visits with results within 3 Day(s) from this visit.  Latest known visit with results is:  Office Visit on 11/04/2019  Component Date Value Ref Range Status  . Glucose, Bld 11/04/2019 101* 65 - 99 mg/dL Final   Comment: .            Fasting reference interval . For someone without known diabetes, a glucose value between 100 and 125 mg/dL is consistent with prediabetes and should be confirmed with a follow-up test. .   . BUN 11/04/2019 17  7 - 25 mg/dL Final  . Creat 11/04/2019 0.75  0.60 - 0.88 mg/dL Final   Comment: For patients >19 years of age, the reference limit for Creatinine is approximately 13% higher for people identified as African-American. .   . GFR, Est Non African American 11/04/2019 69  > OR = 60 mL/min/1.42m Final  . GFR, Est African American 11/04/2019 80  > OR = 60 mL/min/1.788mFinal  . BUN/Creatinine Ratio 0576/72/0947OT APPLICABLE  6 - 22 (calc) Final  . Sodium 11/04/2019 135  135 - 146 mmol/L Final  . Potassium 11/04/2019 5.2  3.5 - 5.3 mmol/L Final  . Chloride 11/04/2019 102  98 - 110 mmol/L Final  . CO2 11/04/2019 25  20 - 32 mmol/L Final  . Calcium 11/04/2019 9.6  8.6 - 10.4 mg/dL Final    Assessment:  HeTEXAS OBORNs a [age].o. female with stage 0 CLL. She presented with a distant history of anemia and recent lymphocytosis. She was noted to have mild lymphocystosis in 06/22/2015. Absolute lymphocyte count (ALC) has ranged between 4700 - 5500.  Work-up on 07/17/2017revealed a hematocrit of 31.6, hemoglobin 11.4, MCV 89, platelets 203,000, WBC 11,00 with an ANC of 3700.  Absolute lymphocyte count was 6400. Ferritin was 116. B12was 119 (low). Folate was 44.0. Retic was 0.9% (low). Uric acid was 4.5. Guaiac cards were negative x  3 in 12/2015.  She was diagnosed with B12 deficiency. She receives B12 monthly (last 11/10/2019). Folatewas 28.2 on 07/18/2019.  Peripheral blood flow cytometryrevealed involvement by a CD5+, CD23+, CD38- monoclonal B cell population with lambda light chain restriction consistent with CLL/SLL. There was a small population (8% of the T cells) of double positive (CD4+/CD8+) T cells (described in association with chronic viral infections, autoimmune disorders, chronic inflammatory disorders, and immunodeficiency states).   She has a slight progressive normocytic anemia. Work-up on 02/12/2018revealed the following normal labs: creatinine, LDH, uric acid, ferritin (105), iron studies (12% sat; TIBC 295), and folate. Retic was 1.1% (low for level of anemia). Coombs was negative on 09/04/2016. Dietis modest. She denies any melena or hematochezia. Guaiac cardswere negative x 3 in 07/2016. Last colonoscopywas >10 years ago.  Ferritinhas been followed: 116 on 01/10/2016, 105 on 08/07/2016, 29 on 11/27/2016, 31 on 03/06/2017, 59 on 07/09/2017, 64 on 11/08/2017, 98 on 05/13/2018, and 74 on 07/18/2019.  She was diagnosed with atrial fibrillationafter presenting withsyncopal episodes. She is on Xarelto.  Chest CTon 11/07/2016 revealed two 2 mm right lung nodules (right major fissure and lateral aspect RUL). There was a 5 mm right lower lobe ground-glass nodule. Chest CTon 11/05/2017 revealed 6 mm ground-glass right lower lobe nodule, stable since 11/07/2016. Follow-up in 2 years was recommended. There were millimetric pulmonary nodules unchanged and considered benign.   Symptomatically, she feels "pretty good".  She denies any B symptoms.  She denies any respiratory symptoms.  Plan: 1.   CLL Clinically,  she is doing well.               She denies any B symptoms.               Last exam on 01/06/2019 revealed no adenopathy or hepatosplenomegaly. Hematocrit 30.1.  Hemoglobin 10.1.  Platelets 185,000 on 10/13/2019.  WBC  9600 (ANC of 3300; ALC 5400).  Discuss evaluation of anemia.             Discuss ongoing surveillance. 2.   Tiny pulmonary nodules Chest CT on 11/05/2017 revealed a 6 mm RLL nodule.  Chest CT on 11/07/2019 was personally reviewed.  Agree with radiology interpretation.     There are small scattered lung nodules (up to 5 mm) which are stable.   No further follow-up is recommended. 3.   B12 deficiency RTC monthly x 6 for B12 (last 11/10/2019).             Folate 28.2 on 07/18/2019   Check folate annually. 4.   Iron deficiency             Hemoglobin 10.1.  MCV 92.3.             Ferritin 74 on 07/18/2019.    Review plan for evaluation of anemia  Continue to monitor 5.   Hyponatremia, improved             Sodium 126 on 10/13/2019 and 135 on 11/04/2019.             Etiology remains unclear.             Continue free water restriction and fluids with electrolytes. 6.   Labs on 11/26/2019:  CBC with diff, retic, ferritin, iron studies, free T4, Coombs, sed rate.   7.   RTC in 6 months for MD assessment and labs (CBC with diff +/- others).  I discussed the assessment and treatment plan with the patient.  The patient was  provided an opportunity to ask questions and all were answered.  The patient agreed with the plan and demonstrated an understanding of the instructions.  The patient was advised to call back if the symptoms worsen or if the condition fails to improve as anticipated.   Lequita Asal, MD, PhD    11/12/2019, 3:44 PM  I, Heywood Footman, am acting as a Education administrator for Lequita Asal, MD.  I, [place] Mike Gip, MD, have reviewed the above documentation for accuracy and completeness, and I agree with the above.

## 2019-11-11 ENCOUNTER — Encounter: Payer: Self-pay | Admitting: Hematology and Oncology

## 2019-11-11 ENCOUNTER — Other Ambulatory Visit: Payer: Self-pay

## 2019-11-11 NOTE — Progress Notes (Signed)
Patient's daughter supplied information for medical record.

## 2019-11-12 ENCOUNTER — Ambulatory Visit (INDEPENDENT_AMBULATORY_CARE_PROVIDER_SITE_OTHER): Payer: Medicare Other | Admitting: Internal Medicine

## 2019-11-12 ENCOUNTER — Encounter: Payer: Self-pay | Admitting: Hematology and Oncology

## 2019-11-12 ENCOUNTER — Encounter: Payer: Self-pay | Admitting: Internal Medicine

## 2019-11-12 ENCOUNTER — Inpatient Hospital Stay (HOSPITAL_BASED_OUTPATIENT_CLINIC_OR_DEPARTMENT_OTHER): Payer: Medicare Other | Admitting: Hematology and Oncology

## 2019-11-12 VITALS — BP 180/70 | HR 56 | Ht 63.0 in | Wt 141.4 lb

## 2019-11-12 DIAGNOSIS — E871 Hypo-osmolality and hyponatremia: Secondary | ICD-10-CM | POA: Diagnosis not present

## 2019-11-12 DIAGNOSIS — I10 Essential (primary) hypertension: Secondary | ICD-10-CM

## 2019-11-12 DIAGNOSIS — C9110 Chronic lymphocytic leukemia of B-cell type not having achieved remission: Secondary | ICD-10-CM

## 2019-11-12 DIAGNOSIS — R918 Other nonspecific abnormal finding of lung field: Secondary | ICD-10-CM

## 2019-11-12 DIAGNOSIS — I480 Paroxysmal atrial fibrillation: Secondary | ICD-10-CM

## 2019-11-12 DIAGNOSIS — E538 Deficiency of other specified B group vitamins: Secondary | ICD-10-CM | POA: Diagnosis not present

## 2019-11-12 DIAGNOSIS — Z79899 Other long term (current) drug therapy: Secondary | ICD-10-CM | POA: Diagnosis not present

## 2019-11-12 DIAGNOSIS — D649 Anemia, unspecified: Secondary | ICD-10-CM | POA: Diagnosis not present

## 2019-11-12 MED ORDER — LISINOPRIL 20 MG PO TABS: 20.0000 mg | ORAL_TABLET | Freq: Every day | ORAL | 2 refills | Status: DC

## 2019-11-12 NOTE — Patient Instructions (Signed)
Medication Instructions:  Your physician has recommended you make the following change in your medication:  1- INCREASE Lisinopril to 20 mg by mouth daily.  *If you need a refill on your cardiac medications before your next appointment, please call your pharmacy*   Lab Work: Your physician recommends that you return for lab work in: [place].  - Sometime between 11/19/19 to 11/26/19. - Please go to the [facility]. You will check in at the front desk to the right as you walk into the atrium. Valet Parking is offered if needed. - No appointment needed. You may go any day between 7 am and 6 pm.  If you have labs (blood work) drawn today and your tests are completely normal, you will receive your results only by: Marland Kitchen MyChart Message (if you have MyChart) OR . A paper copy in the mail If you have any lab test that is abnormal or we need to change your treatment, we will call you to review the results.   Testing/Procedures: none   Follow-Up: At [facility], you and your health needs are our priority.  As part of our continuing mission to provide you with exceptional heart care, we have created designated Provider Care Teams.  These Care Teams include your primary Cardiologist (physician) and Advanced Practice Providers (APPs -  Physician Assistants and Nurse Practitioners) who all work together to provide you with the care you need, when you need it.  We recommend signing up for the patient portal called "MyChart".  Sign up information is provided on this After Visit Summary.  MyChart is used to connect with patients for Virtual Visits (Telemedicine).  Patients are able to view lab/test results, encounter notes, upcoming appointments, etc.  Non-urgent messages can be sent to your provider as well.   To learn more about what you can do with MyChart, go to [email or web address].    Your next appointment:   6 month(s)  The format for your next appointment:   In  Person  Provider:    You may see Nelva Bush, MD or one of the following Advanced Practice Providers on your designated Care Team:    [name], NP  Christell Faith, PA-C  Marrianne Mood, PA-C    Other Instructions Dr End recommends you stop drinking the BodyArmour supplement drink.

## 2019-11-12 NOTE — Progress Notes (Signed)
Follow-up Outpatient Visit Date: 11/12/2019  Primary Care Provider: Hubbard Hartshorn, [address] [address]  Chief Complaint: Follow-up hypertension and atrial fibrillation  HPI:  Sydney Mcdaniel is a 86 y.o. female with history of recurrent falls felt to be due to orthostatic hypotension and vasovagal syncope,paroxysmal atrial fibrillation, HFpEF, HTN, CLL, vitamin B12 deficiency, and anemia, who presents for follow-up of hypertension.  I last saw her in January, at which time Sydney Mcdaniel was feeling well.  She had previously been taken off atenolol due to bradycardia, though this was restarted due to worsening vision.  Despite this, she was feeling well without chest pain, shortness of breath, palpitations, and lightheadedness.  Systolic blood pressure remained mildly elevated with readings in the 140 to 160 mmHg range.  We agreed to increase lisinopril to 15 mg daily.  Today, Sydney Mcdaniel reports she has been feeling relatively well.  She notes a single episode of lightheadedness 2 to 3 weeks ago while watching television.  This resolved on its own.  She otherwise has not had any lightheadedness nor falls.  Home blood pressures remain upper normal to mildly elevated, typically in the 130-160/45-60 range.  She remains compliant with her medications.  She was recently noted to have hyponatremia and was encouraged to begin drinking body armor drinks to improve her sodium.  Sydney Mcdaniel denies chest pain, shortness of breath, and palpitations.  She has mild chronic ankle swelling.  Sydney Mcdaniel denies bleeding and remains compliant with rivaroxaban.  --------------------------------------------------------------------------------------------------  Cardiovascular History & Procedures: Cardiovascular Problems:  Recurrent syncope  Paroxysmal atrial fibrillation  HFpEF  Risk Factors:  Hypertension and age greater than 48  Cath/PCI:  None  CV  Surgery:  None  EP Procedures and Devices:  Event monitor (06/15/16): Dominantly sinus rhythm with paroxysmal atrial fibrillation (4% burden). No significant pauses.  Event monitor(06/02/16): Normal sinus rhythm with first-degree AV block. No significant arrhythmia.  Holter monitor (04/17/16): Predominantly sinus rhythm with first-degree AV block. PACs and short atrial runs lasting up to 4 beats noted. Rare PVCs.  Non-Invasive Evaluation(s):  Pharmacologic MPI (02/26/19): Low risk study without ischemia. Unable to assess LVEF secondary to GI uptake.  TTE (02/26/19): Normal LV size and wall thickness. LVEF 60-65% with grade 2 diastolic dysfunction. Normal RV size and function. Unable to assess RVSP. No significant valvular abnormality.  Carotid Doppler (05/02/18): Mild right ICA stenosis (1-39%). Moderate left ICA stenosis (40-59%).  Carotid Doppler (04/30/17): Moderate (40-59%) proximal ICA stenoses bilaterally. Greater than 50% left external carotid artery stenosis. No significant change from prior study in 04/2016.  TTE (04/17/16): Normal LV size. LVEF 55-60%. Normal wall motion and diastolic function. Mild aortic and mitral regurgitation. Normal RV size and function. Normal pulmonary artery pressure.  Pharmacologic MPI (04/10/16): Low risk study with small fixed defect at the apex, likely due to breast attenuation. No ischemia. LVEF 55-65%.  Recent CV Pertinent Labs: Lab Results  Component Value Date   CHOL 153 07/30/2019   CHOL 141 08/24/2015   HDL 50 07/30/2019   HDL 52 08/24/2015   LDLCALC 81 07/30/2019   LDLCALC 90 07/02/2018   TRIG 108 07/30/2019   CHOLHDL 3.1 07/30/2019   K 5.2 11/04/2019   MG 2.2 12/21/2015   BUN 17 11/04/2019   BUN 14 03/12/2018   CREATININE 0.75 11/04/2019    Past medical and surgical history were reviewed and updated in EPIC.  Current Meds  Medication Sig  . ACCU-CHEK SOFTCLIX LANCETS lancets Use  as instructed; check once a  day; H40.1131  . alendronate (FOSAMAX) 70 MG tablet TAKE ONE TABLET BY MOUTH EVERY 7 DAYS. TAKE WITH A FULL GLASS OF WATER ON AN EMPTY STOMACH.  Marland Kitchen amLODipine (NORVASC) 5 MG tablet Take 1 tablet (5 mg total) by mouth daily.  Marland Kitchen atorvastatin (LIPITOR) 20 MG tablet Take 1 tablet (20 mg total) by mouth daily.  . Calcium Carb-Cholecalciferol (CALCIUM 600 + D PO) Take 1 tablet by mouth daily.   . cholecalciferol (VITAMIN D) 1000 units tablet Take 1,000 Units by mouth daily.  . Cyanocobalamin (B-12) 1000 MCG/ML KIT Inject 1,000 mcg as directed every 30 (thirty) days.  . famotidine (PEPCID) 20 MG tablet Take 1 tablet (20 mg total) by mouth at bedtime.  Marland Kitchen glucose blood (ACCU-CHEK AVIVA PLUS) test strip Use as instructed, check once a day; H40.1131  . IRON PO Take 1 tablet by mouth 2 (two) times daily.   Marland Kitchen latanoprost (XALATAN) 0.005 % ophthalmic solution Place 1 drop into the right eye at bedtime.   Marland Kitchen levothyroxine (EUTHYROX) 75 MCG tablet TAKE ONE TABLET BY MOUTH ON MONDAYS, WEDNESDAYS, FRIDAYS, AND SUNDAYS.  Marland Kitchen levothyroxine (SYNTHROID) 88 MCG tablet Take 88 mcg by mouth daily before breakfast. 22mg on Tues,Thurs,Sat  . lisinopril (ZESTRIL) 5 MG tablet Take 3 tablets (15 mg total) by mouth daily.  . Multiple Vitamins-Minerals (MULTIVITAMIN ADULT PO) Take by mouth.  . Omega-3 Fatty Acids (FISH OIL) 1200 MG CAPS Take by mouth daily.  . rivaroxaban (XARELTO) 20 MG TABS tablet Take 1 tablet (20 mg total) by mouth daily with supper.  . timolol (BETIMOL) 0.5 % ophthalmic solution Place 1 drop into the right eye 2 (two) times daily.    Allergies: Other, Alphagan [brimonidine], Pravachol [pravastatin sodium], and Pravastatin  Social History   Tobacco Use  . Smoking status: Never Smoker  . Smokeless tobacco: Never Used  Substance Use Topics  . Alcohol use: No  . Drug use: No    Family History  Problem Relation Age of Onset  . Heart attack Mother   . Glaucoma Mother   . Heart attack Father   .  Parkinson's disease Brother   . Heart attack Brother     Review of Systems: A 12-system review of systems was performed and was negative except as noted in the HPI.  --------------------------------------------------------------------------------------------------  Physical Exam: BP (!) 180/70 (BP Location: Left Arm, Patient Position: Sitting, Cuff Size: Normal)   Pulse (!) 56   Ht '5\' 3"'  (1.6 m)   Wt 141 lb 6 oz (64.1 kg)   LMP  (LMP Unknown)   SpO2 98%   BMI 25.04 kg/m    Repeat BP 164/56  General: NAD.  Accompanied by her daughter. HEENT: No conjunctival pallor or scleral icterus. Facemask in place. Neck: No JVD or HJR. Lungs: Normal work of breathing. Clear to auscultation bilaterally without wheezes or crackles. Heart: Bradycardic but regular without murmurs, rubs, or gallops. Non-displaced PMI. Abd: Bowel sounds present. Soft, NT/ND without hepatosplenomegaly Ext: Trace ankle edema bilaterally.  EKG: Sinus bradycardia (heart rate 56 bpm) with first-degree AV block and nonspecific intraventricular conduction delay.  QRS has widened slightly from prior tracings.  Lab Results  Component Value Date   WBC 9.6 10/13/2019   HGB 10.1 (L) 10/13/2019   HCT 30.1 (L) 10/13/2019   MCV 92.3 10/13/2019   PLT 185 10/13/2019    Lab Results  Component Value Date   NA 135 11/04/2019   K 5.2 11/04/2019  CL 102 11/04/2019   CO2 25 11/04/2019   BUN 17 11/04/2019   CREATININE 0.75 11/04/2019   GLUCOSE 101 (H) 11/04/2019   ALT 15 10/21/2019    Lab Results  Component Value Date   CHOL 153 07/30/2019   HDL 50 07/30/2019   LDLCALC 81 07/30/2019   TRIG 108 07/30/2019   CHOLHDL 3.1 07/30/2019    --------------------------------------------------------------------------------------------------  ASSESSMENT AND PLAN: Hypertension: Blood pressure suboptimally controlled today and frequently upper normal to moderately elevated at home.  I recommended increasing lisinopril to 20  mg daily with repeat BMP in 1 to 2 weeks.  I have recommended that Sydney Mcdaniel stop consuming Body Armor, as per the nutritional facts, it contains a modest amount of potassium and minimal sodium.  Her potassium was borderline elevated on last check, and in the setting of increasing lisinopril, we hope to avoid hyperkalemia.  BP will be rechecked at follow-up with her PCP next month.  Paroxysmal atrial fibrillation: Sydney Mcdaniel is maintaining sinus rhythm today.  Mild sinus bradycardia and first-degree AV block persist.  QRS is also widened slightly and is now greater than 120 ms.  We previously tried to discontinue timolol, but her vision worsened with this.  We will defer medication changes today.  Hyponatremia: Incidentally noted on prior labs, typically mild.  A sodium of 126 was noted in April after which it was recommended that she increase her sodium consumption and consider using Body Armor to replete electrolytes.  Given her elevated blood pressure and rising potassium, I have recommended that Sydney Mcdaniel stop consuming body armor or similar beverages.  Typically, hyponatremia is not related to inadequate sodium intake but instead a manifestation of water handling by the kidney.  We will repeat a BMP in 1-2 weeks.  Follow-up: Return to clinic in 6 months.  Nelva Bush, MD 11/12/2019 9:58 AM

## 2019-11-13 DIAGNOSIS — H401131 Primary open-angle glaucoma, bilateral, mild stage: Secondary | ICD-10-CM | POA: Diagnosis not present

## 2019-11-19 MED ORDER — LEVOTHYROXINE SODIUM 88 MCG PO TABS: 88.0000 ug | ORAL_TABLET | Freq: Every day | ORAL | 3 refills | Status: DC

## 2019-11-19 MED ORDER — LEVOTHYROXINE SODIUM 75 MCG PO TABS: ORAL_TABLET | ORAL | 3 refills | Status: DC

## 2019-11-23 ENCOUNTER — Encounter: Payer: Self-pay | Admitting: Family Medicine

## 2019-11-25 ENCOUNTER — Ambulatory Visit: Payer: Medicare Other | Admitting: Family Medicine

## 2019-11-26 ENCOUNTER — Other Ambulatory Visit
Admission: RE | Admit: 2019-11-26 | Discharge: 2019-11-26 | Disposition: A | Discharge status: Home / Self Care | Payer: Medicare Other | Attending: Hematology and Oncology | Admitting: Hematology and Oncology

## 2019-11-26 ENCOUNTER — Other Ambulatory Visit
Admission: RE | Admit: 2019-11-26 | Discharge: 2019-11-26 | Disposition: A | Discharge status: Home / Self Care | Payer: Medicare Other | Attending: Internal Medicine | Admitting: Internal Medicine

## 2019-11-26 ENCOUNTER — Other Ambulatory Visit: Payer: Medicare Other

## 2019-11-26 ENCOUNTER — Other Ambulatory Visit: Payer: Self-pay

## 2019-11-26 DIAGNOSIS — D649 Anemia, unspecified: Secondary | ICD-10-CM

## 2019-11-26 DIAGNOSIS — I10 Essential (primary) hypertension: Secondary | ICD-10-CM | POA: Diagnosis not present

## 2019-11-26 DIAGNOSIS — Z79899 Other long term (current) drug therapy: Secondary | ICD-10-CM

## 2019-11-26 DIAGNOSIS — C9110 Chronic lymphocytic leukemia of B-cell type not having achieved remission: Secondary | ICD-10-CM

## 2019-11-26 DIAGNOSIS — H401131 Primary open-angle glaucoma, bilateral, mild stage: Secondary | ICD-10-CM | POA: Diagnosis not present

## 2019-11-26 DIAGNOSIS — E538 Deficiency of other specified B group vitamins: Secondary | ICD-10-CM | POA: Insufficient documentation

## 2019-11-26 DIAGNOSIS — R918 Other nonspecific abnormal finding of lung field: Secondary | ICD-10-CM

## 2019-11-26 LAB — IRON AND TIBC
Iron: 42 ug/dL (ref 28–170)
Saturation Ratios: 13 % (ref 10.4–31.8)
TIBC: 316 ug/dL (ref 250–450)
UIBC: 274 ug/dL

## 2019-11-26 LAB — CBC WITH DIFFERENTIAL/PLATELET
Abs Immature Granulocytes: 0.01 K/uL (ref 0.00–0.07)
Basophils Absolute: 0 K/uL (ref 0.0–0.1)
Basophils Relative: 0 %
Eosinophils Absolute: 0.1 K/uL (ref 0.0–0.5)
Eosinophils Relative: 1 %
HCT: 32.4 % — ABNORMAL LOW (ref 36.0–46.0)
Hemoglobin: 10.8 g/dL — ABNORMAL LOW (ref 12.0–15.0)
Immature Granulocytes: 0 %
Lymphocytes Relative: 48 %
Lymphs Abs: 3.9 K/uL (ref 0.7–4.0)
MCH: 30.5 pg (ref 26.0–34.0)
MCHC: 33.3 g/dL (ref 30.0–36.0)
MCV: 91.5 fL (ref 80.0–100.0)
Monocytes Absolute: 0.8 K/uL (ref 0.1–1.0)
Monocytes Relative: 9 %
Neutro Abs: 3.4 K/uL (ref 1.7–7.7)
Neutrophils Relative %: 42 %
Platelets: 178 K/uL (ref 150–400)
RBC: 3.54 MIL/uL — ABNORMAL LOW (ref 3.87–5.11)
RDW: 14.2 % (ref 11.5–15.5)
WBC: 8.3 K/uL (ref 4.0–10.5)
nRBC: 0 % (ref 0.0–0.2)

## 2019-11-26 LAB — BASIC METABOLIC PANEL
Anion gap: 9 (ref 5–15)
BUN: 24 mg/dL — ABNORMAL HIGH (ref 8–23)
CO2: 25 mmol/L (ref 22–32)
Calcium: 9.6 mg/dL (ref 8.9–10.3)
Chloride: 100 mmol/L (ref 98–111)
Creatinine, Ser: 0.78 mg/dL (ref 0.44–1.00)
GFR calc Af Amer: >60 mL/min (ref >60)
GFR calc non Af Amer: >60 mL/min (ref >60)
Glucose, Bld: 103 mg/dL — ABNORMAL HIGH (ref 70–99)
Potassium: 4.3 mmol/L (ref 3.5–5.1)
Sodium: 134 mmol/L — ABNORMAL LOW (ref 135–145)

## 2019-11-26 LAB — SEDIMENTATION RATE: Sed Rate: 46 mm/hr — ABNORMAL HIGH (ref 0–30)

## 2019-11-26 LAB — DAT, POLYSPECIFIC AHG (ARMC ONLY)
DAT, IgG: POSITIVE
DAT, complement: POSITIVE
Polyspecific AHG test: POSITIVE

## 2019-11-26 LAB — RETICULOCYTES
Immature Retic Fract: 6.7 % (ref 2.3–15.9)
RBC.: 3.48 MIL/uL — ABNORMAL LOW (ref 3.87–5.11)
Retic Count, Absolute: 48.7 10*3/uL (ref 19.0–186.0)
Retic Ct Pct: 1.4 % (ref 0.4–3.1)

## 2019-11-26 LAB — T4, FREE: Free T4: 0.92 ng/dL (ref 0.61–1.12)

## 2019-11-26 LAB — FERRITIN: Ferritin: 73 ng/mL (ref 11–307)

## 2019-12-03 NOTE — Progress Notes (Signed)
[facility]  [address], [address] [place], [place] [address] Phone: [phone]  Fax: [phone]   Clinic Day:  12/04/2019  Referring physician: Hubbard Hartshorn, FNP  Chief Complaint: Sydney Mcdaniel is a 86 y.o. female with  chronic lymphocytic leukemia, B12 deficiency, and anemia who is seen for review of work-up and discussion regarding direction of therapy.   HPI: The patient was last seen in the hematology clinic on 11/12/2019 for a telemedicine visit. At that time, she felt "pretty good".  Energy level was described as low. She was taking oral iron.  She denied any recent episodes of atrial fibrillation.  Labs on 11/26/2019 included hematocrit 32.4, hemoglobin 10.8, platelets 178,000, WBC 8,300 (ANC 3400; ALC 3900). Ferritin was 73 with an iron saturation of 13% and a TIBC 316.  Creatinine was 0.78.  Retic was 1.4% (low).  Free T4 was 0.92. Sed rate was 46.  DAT was positive (IgG).   During the interim, she was feeling "ok". She has no energy. She has poor balance. She denies any falls. She is eating well. She denies early satiety. She has dark stool due to oral iron. Her daughter reports she had a nosebleed x 1 week ago. Patient woke up with blood on her pillow. She had blood in the tissue when blowing her nose. They feel like it was a blood vessel. She denies any B symptoms. She denies any lumps or bumps. Since she is on a blood thinner she notes her doctor recommended she take tylenol for pain.   I encouraged her daughter to contact the clinic if the patient has any worsening symptoms. Her daughter understood.   Her BP was 182/47 today. Her normal systolic blood pressure is 140-160. She has a monitor to check her BP daly.    Past Medical History:  Diagnosis Date  . Allergy   . Anemia   . Cataract   . CLL (chronic lymphocytic leukemia) ([place])   . Glaucoma   . H/O: hysterectomy    Total  . Hyperlipidemia   . Hypertension   . Hypothyroidism   .  Impaired fasting glucose   . Lichen sclerosus   . Osteoporosis    Hips  . PAF (paroxysmal atrial fibrillation) ([place])    a. 06/15/2016 Event monitor: 4% afib burden; b. CHA2DS2VASc - 4-->Xarelto.  . Syncope    a. 03/2016 Echo: EF55-60%, no rwma, mild AI/MR, nl PASP; b. 03/2016 48h Holter: no significant arrhythmias/pauses; c. 03/2016 MV: mild apical defect, likely breast attenuation, nl EF, low risk; d. 05/2016 Event monitor: No significant arrhythmia; e. 05/2016 Event monitor: PAF (4%).    Past Surgical History:  Procedure Laterality Date  . APPENDECTOMY    . EYE SURGERY     Glaucoma  . TOTAL ABDOMINAL HYSTERECTOMY      Family History  Problem Relation Age of Onset  . Heart attack Mother   . Glaucoma Mother   . Heart attack Father   . Parkinson's disease Brother   . Heart attack Brother     Social History:  reports that she has never smoked. She has never used smokeless tobacco. She reports that she does not drink alcohol and does not use drugs.  Her husband died in 2014-12-19. She notes that she played basketball 75 years ago on a half court. She lives in [email or web address] [email or web address]. Her daughter can be reached at [phone] The patient is accompanied by her dauhgter via iPad today.  Allergies:  Allergies  Allergen Reactions  . Other   . Alphagan [Brimonidine] Itching and Rash  . Pravachol [Pravastatin Sodium] Itching and Rash  . Pravastatin Itching, Other (See Comments) and Rash    Current Medications: Current Outpatient Medications  Medication Sig Dispense Refill  . ACCU-CHEK SOFTCLIX LANCETS lancets Use as instructed; check once a day; H40.1131 100 each 12  . alendronate (FOSAMAX) 70 MG tablet TAKE ONE TABLET BY MOUTH EVERY 7 DAYS. TAKE WITH A FULL GLASS OF WATER ON AN EMPTY STOMACH. 12 tablet 0  . amLODipine (NORVASC) 5 MG tablet Take 1 tablet (5 mg total) by mouth daily. 90 tablet 1  . atorvastatin (LIPITOR) 20 MG tablet Take 1 tablet (20 mg total) by mouth  daily. 90 tablet 1  . Calcium Carb-Cholecalciferol (CALCIUM 600 + D PO) Take 1 tablet by mouth daily.     . cholecalciferol (VITAMIN D) 1000 units tablet Take 1,000 Units by mouth daily.    . Cyanocobalamin (B-12) 1000 MCG/ML KIT Inject 1,000 mcg as directed every 30 (thirty) days.    . famotidine (PEPCID) 20 MG tablet Take 1 tablet (20 mg total) by mouth at bedtime. 30 tablet 5  . glucose blood (ACCU-CHEK AVIVA PLUS) test strip Use as instructed, check once a day; H40.1131 100 each 3  . IRON PO Take 1 tablet by mouth 2 (two) times daily.     Marland Kitchen latanoprost (XALATAN) 0.005 % ophthalmic solution Place 1 drop into the right eye at bedtime.     Marland Kitchen levothyroxine (EUTHYROX) 75 MCG tablet TAKE ONE TABLET BY MOUTH ON MONDAYS, WEDNESDAYS, FRIDAYS, AND SUNDAYS. 52 tablet 3  . levothyroxine (SYNTHROID) 88 MCG tablet Take 1 tablet (88 mcg total) by mouth daily before breakfast. 59mg on Tues,Thurs,Sat 39 tablet 3  . lisinopril (ZESTRIL) 20 MG tablet Take 1 tablet (20 mg total) by mouth daily. 90 tablet 2  . Multiple Vitamins-Minerals (MULTIVITAMIN ADULT PO) Take by mouth.    . Omega-3 Fatty Acids (FISH OIL) 1200 MG CAPS Take by mouth daily.    . rivaroxaban (XARELTO) 20 MG TABS tablet Take 1 tablet (20 mg total) by mouth daily with supper. 90 tablet 2  . timolol (BETIMOL) 0.5 % ophthalmic solution Place 1 drop into the right eye 2 (two) times daily.     No current facility-administered medications for this visit.    Review of Systems  Constitutional: Positive for malaise/fatigue (no energy). Negative for chills, diaphoresis, fever and weight loss (stable).       Feeling "ok".  HENT: Positive for nosebleeds (x 1 week ago). Negative for congestion, ear discharge, ear pain, hearing loss, sinus pain, sore throat and tinnitus.   Eyes: Negative for blurred vision.       Glaucoma.   Respiratory: Negative for cough, hemoptysis, sputum production and shortness of breath.   Cardiovascular: Negative for chest  pain, palpitations and leg swelling (goes down at night).       Atrial fibrillation.   Gastrointestinal: Positive for melena (on oral iron). Negative for abdominal pain, blood in stool, constipation, diarrhea, heartburn, nausea and vomiting.       Eating well. No early satiety.  Genitourinary: Negative for dysuria, frequency, hematuria and urgency.  Musculoskeletal: Negative for back pain, joint pain (left hip, arthritic; improved), myalgias and neck pain.  Skin: Negative for itching and rash.  Neurological: Negative for dizziness, tingling, sensory change, weakness and headaches.       Poor balance.  Endo/Heme/Allergies: Does not bruise/bleed easily.  Psychiatric/Behavioral: Negative for depression  and memory loss. The patient is not nervous/anxious and does not have insomnia.   All other systems reviewed and are negative.  Performance status (ECOG): 1  Vitals Blood pressure (!) 182/47, pulse 67, temperature (!) 97.3 F (36.3 C), temperature source Tympanic, weight 140 lb 8.7 oz (63.7 kg), SpO2 100 %.   Physical Exam Vitals and nursing note reviewed.  Constitutional:      General: She is not in acute distress.    Appearance: She is well-developed and well-nourished. She is not diaphoretic.  HENT:     Head: Normocephalic and atraumatic.     Mouth/Throat:     Mouth: Oropharynx is clear and moist.     Pharynx: No oropharyngeal exudate.      Comments: Curly gray hair. Mask. Eyes:     General: No scleral icterus.    Extraocular Movements: EOM normal.     Conjunctiva/sclera: Conjunctivae normal.     Pupils: Pupils are equal, round, and reactive to light.     Comments: Blue eyes.  Cardiovascular:     Rate and Rhythm: Normal rate and regular rhythm.     Heart sounds: Normal heart sounds. No murmur heard.   Pulmonary:     Effort: Pulmonary effort is normal. No respiratory distress.     Breath sounds: Normal breath sounds. No wheezing or rales.  Chest:     Chest wall: No  tenderness.  Abdominal:     General: Bowel sounds are normal. There is no distension.     Palpations: Abdomen is soft. There is no mass.     Tenderness: There is no abdominal tenderness. There is no guarding or rebound.  Musculoskeletal:        General: No tenderness or edema. Normal range of motion.     Cervical back: Normal range of motion and neck supple.  Lymphadenopathy:     Head:     Right side of head: No preauricular, posterior auricular or occipital adenopathy.     Left side of head: No preauricular, posterior auricular or occipital adenopathy.     Cervical: No cervical adenopathy.     Upper Body:  No axillary adenopathy present.    Right upper body: No supraclavicular adenopathy.     Left upper body: No supraclavicular adenopathy.     Lower Body: No right inguinal adenopathy. No left inguinal adenopathy.  Skin:    General: Skin is warm and dry.  Neurological:     Mental Status: She is alert and oriented to person, place, and time.  Psychiatric:        Mood and Affect: Mood and affect normal.        Behavior: Behavior normal.        Thought Content: Thought content normal.        Judgment: Judgment normal.    Appointment on 12/04/2019  Component Date Value Ref Range Status  . WBC 12/04/2019 9.0  4.0 - 10.5 K/uL Final  . RBC 12/04/2019 3.25* 3.87 - 5.11 MIL/uL Final  . Hemoglobin 12/04/2019 10.0* 12.0 - 15.0 g/dL Final  . HCT 12/04/2019 30.2* 36 - 46 % Final  . MCV 12/04/2019 92.9  80.0 - 100.0 fL Final  . MCH 12/04/2019 30.8  26.0 - 34.0 pg Final  . MCHC 12/04/2019 33.1  30.0 - 36.0 g/dL Final  . RDW 12/04/2019 14.3  11.5 - 15.5 % Final  . Platelets 12/04/2019 179  150 - 400 K/uL Final  . nRBC 12/04/2019 0.0  0.0 - 0.2 %  Final   Performed at [facility], [address] [place], [place] [address]  . Neutrophils Relative % 12/04/2019 PENDING  % Incomplete  . Neutro Abs 12/04/2019 PENDING  1.7 - 7.7 K/uL Incomplete  . Band Neutrophils 12/04/2019  PENDING  % Incomplete  . Lymphocytes Relative 12/04/2019 PENDING  % Incomplete  . Lymphs Abs 12/04/2019 PENDING  0.7 - 4.0 K/uL Incomplete  . Monocytes Relative 12/04/2019 PENDING  % Incomplete  . Monocytes Absolute 12/04/2019 PENDING  0 - 1 K/uL Incomplete  . Eosinophils Relative 12/04/2019 PENDING  % Incomplete  . Eosinophils Absolute 12/04/2019 PENDING  0 - 0 K/uL Incomplete  . Basophils Relative 12/04/2019 PENDING  % Incomplete  . Basophils Absolute 12/04/2019 PENDING  0 - 0 K/uL Incomplete  . WBC Morphology 12/04/2019 PENDING   Incomplete  . RBC Morphology 12/04/2019 PENDING   Incomplete  . Smear Review 12/04/2019 PENDING   Incomplete  . Other 12/04/2019 PENDING  % Incomplete  . nRBC 12/04/2019 PENDING  0 /100 WBC Incomplete  . Metamyelocytes Relative 12/04/2019 PENDING  % Incomplete  . Myelocytes 12/04/2019 PENDING  % Incomplete  . Promyelocytes Relative 12/04/2019 PENDING  % Incomplete  . Blasts 12/04/2019 PENDING  % Incomplete  . Immature Granulocytes 12/04/2019 PENDING  % Incomplete  . Abs Immature Granulocytes 12/04/2019 PENDING  0.00 - 0.07 K/uL Incomplete    Assessment:  Sydney Mcdaniel is a 86 y.o. female with stage 0 CLL. She presented with a distant history of anemia and recent lymphocytosis. She was noted to have mild lymphocystosis in 06/22/2015. Absolute lymphocyte count (ALC) has ranged between 4700 - 5500.  Work-up on 07/17/2017revealed a hematocrit of 31.6, hemoglobin 11.4, MCV 89, platelets 203,000, WBC 11,00 with an ANC of 3700. Absolute lymphocyte count was 6400. Ferritin was 116. B12was 119 (low). Folate was 44.0. Retic was 0.9% (low). Uric acid was 4.5. Guaiac cards were negative x 3 in 12/2015.  She was diagnosed with B12 deficiency. She receives B12 monthly (last01/22/2021). Folatewas 28.2 on 07/18/2019.  Peripheral blood flow cytometryrevealed involvement by a CD5+, CD23+, CD38- monoclonal B cell population with lambda light chain  restriction consistent with CLL/SLL. There was a small population (8% of the T cells) of double positive (CD4+/CD8+) T cells (described in association with chronic viral infections, autoimmune disorders, chronic inflammatory disorders, and immunodeficiency states).   She has a slight progressive normocytic anemia. Work-up on 02/12/2018revealed the following normal labs: creatinine, LDH, uric acid, ferritin (105), iron studies (12% sat; TIBC 295), and folate. Retic was 1.1% (low for level of anemia). Coom[place] was negative on 09/04/2016. Dietis modest. She denies any melena or hematochezia. Guaiac cardswere negative x 3 in 07/2016. Last colonoscopywas >10 years ago.  Ferritinhas been followed: 116 on 01/10/2016, 105 on 08/07/2016, 29 on 11/27/2016, 31 on 03/06/2017, 59 on 07/09/2017, 64 on 11/08/2017, 98 on 05/13/2018, 74 on 07/18/2019, and 73 on 11/26/2019.  She was diagnosed with atrial fibrillationafter presenting withsyncopal episodes. She is on Xarelto.  Chest CTon 11/07/2016 revealed two 2 mm right lung nodules (right major fissure and lateral aspect RUL). There was a 5 mm right lower lobe ground-glass nodule. Chest CTon 11/05/2017 revealed 6 mm ground-glass right lower lobe nodule, stable since 11/07/2016. Follow-up in 2 years was recommended. There were millimetric pulmonary nodules unchanged and considered benign.  Symptomatically, she feels "ok". She has no energy. She has poor balance. She denies any falls. She is eating well.  She has dark stool on oral iron.  Plan: 1.  Labs today:  CBC with diff, CMP, LDH, haptoglobin, folate, hepatitis B core antibody total, hepatitis B surface antigen, hepatitis C antibody. 2.   Peripheral smear for review. 3. CLL Clinically, she appears to be doing well. She denies any B symptoms Exam reveals no adenopathy or hepatosplenomegaly. Hematocrit 30.2. Hemoglobin 10.0. Platelets  179,000.  WBC9000 ([place] of 3200; ALC 4900). Discuss ongoing surveillance. 4.   Mild normocytic anemia  Hematocrit 30.1.  Hemoglobin 10.1.  MCV 92.3 on 10/13/2019.  Hematocrit 32.4.  Hemoglobin 10.8.  MCV 91.5 on 11/26/2019.  Normal studies:  ferritin, iron studies, TSH, free T4, and folate.  Patient remains on monthly B12.  DAT+ for IgG c/w a warm autoimmune antibody.  Retic count was low and thus not suggesting hemolysis.  Anemia in CLL can be the result of marrow infiltration, splenomegaly, and hemolytic anemia.  Discussed plan to check bilirubin, LDH, haptoglobin, peripheral smear (spherocytes).  Discuss potential treatments:  steroids and/or Rituxan.  Current data suggest no active hemolysis requiring treatment. 5. B12 deficiency RTC monthly x 6 for B12 (last 11/10/2019). Folate 28.2 on 07/18/2019              Check folate annually. 6.Iron deficiency Hemoglobin 10.0. MCV 92.9. Ferritin 73.               Continue to monitor. 7. B12 deficiency RTC monthly x 6 for B12 (last 11/10/2019). Folate 28.2 on 07/18/2019              Check folate annually. 8.   Tiny pulmonary nodules Chest CT on 11/05/2017 revealed a 6 mm RLL nodule.  Chest CT on 11/07/2019 was personally reviewed.  Agree with radiology interpretation.                           There are small scattered lung nodules (up to 5 mm) which are stable.             No further follow-up is recommended. 9. Hyponatremia Sodium 130. Etiology remains unclear. Continue free water restriction and fluids with electrolytes.  Follow-up with primary care.  10.   RN to call patient with results. 11.   RTC in 3 months for MD assessment and labs (CBC with diff, CMP, retic, DAT, haptoglobin).    Addendum:  Bilirubin 0.4.  LDH 117 (normal).  Hemoglobin stable.  Peripheral smear with  relative lymphocytosis, composed of small lymphocytes.  There was no increase in larger prolymphocytes.  There was a normocytic anemia with normal platelet count and morphology  I discussed the assessment and treatment plan with the patient.  The patient was provided an opportunity to ask questions and all were answered.  The patient agreed with the plan and demonstrated an understanding of the instructions.  The patient was advised to call back if the symptoms worsen or if the condition fails to improve as anticipated.   Lequita Asal, MD, PhD    12/04/2019, 11:07 AM  I, Selena Batten, am acting as scribe for [employer]. Mike Gip, MD, PhD.  I, Talon Regala C. Mike Gip, MD, have reviewed the above documentation for accuracy and completeness, and I agree with the above.

## 2019-12-04 ENCOUNTER — Inpatient Hospital Stay: Payer: Medicare Other | Attending: Hematology and Oncology | Admitting: Hematology and Oncology

## 2019-12-04 ENCOUNTER — Other Ambulatory Visit: Payer: Self-pay

## 2019-12-04 ENCOUNTER — Inpatient Hospital Stay: Payer: Medicare Other

## 2019-12-04 ENCOUNTER — Encounter: Payer: Self-pay | Admitting: Hematology and Oncology

## 2019-12-04 VITALS — BP 182/47 | HR 67 | Temp 97.3°F | Wt 140.5 lb

## 2019-12-04 DIAGNOSIS — D649 Anemia, unspecified: Secondary | ICD-10-CM

## 2019-12-04 DIAGNOSIS — C9110 Chronic lymphocytic leukemia of B-cell type not having achieved remission: Secondary | ICD-10-CM | POA: Insufficient documentation

## 2019-12-04 DIAGNOSIS — E039 Hypothyroidism, unspecified: Secondary | ICD-10-CM | POA: Insufficient documentation

## 2019-12-04 DIAGNOSIS — R768 Other specified abnormal immunological findings in serum: Secondary | ICD-10-CM

## 2019-12-04 DIAGNOSIS — I480 Paroxysmal atrial fibrillation: Secondary | ICD-10-CM | POA: Diagnosis not present

## 2019-12-04 DIAGNOSIS — E871 Hypo-osmolality and hyponatremia: Secondary | ICD-10-CM | POA: Diagnosis not present

## 2019-12-04 DIAGNOSIS — R918 Other nonspecific abnormal finding of lung field: Secondary | ICD-10-CM | POA: Insufficient documentation

## 2019-12-04 DIAGNOSIS — Z7901 Long term (current) use of anticoagulants: Secondary | ICD-10-CM | POA: Diagnosis not present

## 2019-12-04 DIAGNOSIS — E538 Deficiency of other specified B group vitamins: Secondary | ICD-10-CM | POA: Diagnosis not present

## 2019-12-04 LAB — CBC WITH DIFFERENTIAL/PLATELET
Abs Immature Granulocytes: 0.02 10*3/uL (ref 0.00–0.07)
Basophils Absolute: 0 10*3/uL (ref 0.0–0.1)
Basophils Relative: 0 %
Eosinophils Absolute: 0.1 10*3/uL (ref 0.0–0.5)
Eosinophils Relative: 1 %
HCT: 30.2 % — ABNORMAL LOW (ref 36.0–46.0)
Hemoglobin: 10 g/dL — ABNORMAL LOW (ref 12.0–15.0)
Immature Granulocytes: 0 %
Lymphocytes Relative: 55 %
Lymphs Abs: 4.9 10*3/uL — ABNORMAL HIGH (ref 0.7–4.0)
MCH: 30.8 pg (ref 26.0–34.0)
MCHC: 33.1 g/dL (ref 30.0–36.0)
MCV: 92.9 fL (ref 80.0–100.0)
Monocytes Absolute: 0.7 10*3/uL (ref 0.1–1.0)
Monocytes Relative: 8 %
Neutro Abs: 3.2 10*3/uL (ref 1.7–7.7)
Neutrophils Relative %: 36 %
Platelets: 179 10*3/uL (ref 150–400)
RBC: 3.25 MIL/uL — ABNORMAL LOW (ref 3.87–5.11)
RDW: 14.3 % (ref 11.5–15.5)
Smear Review: NORMAL
WBC: 9 10*3/uL (ref 4.0–10.5)
nRBC: 0 % (ref 0.0–0.2)

## 2019-12-04 LAB — COMPREHENSIVE METABOLIC PANEL
ALT: 20 U/L (ref 0–44)
AST: 21 U/L (ref 15–41)
Albumin: 3.8 g/dL (ref 3.5–5.0)
Alkaline Phosphatase: 23 U/L — ABNORMAL LOW (ref 38–126)
Anion gap: 6 (ref 5–15)
BUN: 21 mg/dL (ref 8–23)
CO2: 25 mmol/L (ref 22–32)
Calcium: 8.9 mg/dL (ref 8.9–10.3)
Chloride: 99 mmol/L (ref 98–111)
Creatinine, Ser: 0.87 mg/dL (ref 0.44–1.00)
GFR calc Af Amer: >60 mL/min (ref >60)
GFR calc non Af Amer: 58 mL/min — ABNORMAL LOW (ref >60)
Glucose, Bld: 93 mg/dL (ref 70–99)
Potassium: 4.5 mmol/L (ref 3.5–5.1)
Sodium: 130 mmol/L — ABNORMAL LOW (ref 135–145)
Total Bilirubin: 0.4 mg/dL (ref 0.3–1.2)
Total Protein: 7.2 g/dL (ref 6.5–8.1)

## 2019-12-04 LAB — PATHOLOGIST SMEAR REVIEW

## 2019-12-04 LAB — HEPATITIS B CORE ANTIBODY, TOTAL: Hep B Core Total Ab: NONREACTIVE

## 2019-12-04 LAB — HEPATITIS C ANTIBODY: HCV Ab: NONREACTIVE

## 2019-12-04 LAB — HEPATITIS B SURFACE ANTIGEN: Hepatitis B Surface Ag: NONREACTIVE

## 2019-12-04 LAB — LACTATE DEHYDROGENASE: LDH: 117 U/L (ref 98–192)

## 2019-12-05 ENCOUNTER — Ambulatory Visit: Payer: Medicare Other

## 2019-12-05 LAB — HAPTOGLOBIN: Haptoglobin: 120 mg/dL (ref 41–333)

## 2019-12-05 LAB — FOLATE: Folate: 31 ng/mL (ref >5.9)

## 2019-12-08 ENCOUNTER — Encounter: Payer: Self-pay | Admitting: Family Medicine

## 2019-12-08 ENCOUNTER — Ambulatory Visit (INDEPENDENT_AMBULATORY_CARE_PROVIDER_SITE_OTHER): Payer: Medicare Other | Admitting: Family Medicine

## 2019-12-08 ENCOUNTER — Other Ambulatory Visit: Payer: Self-pay

## 2019-12-08 VITALS — BP 142/76 | HR 65 | Temp 97.8°F | Resp 14 | Ht 63.0 in | Wt 142.1 lb

## 2019-12-08 DIAGNOSIS — I10 Essential (primary) hypertension: Secondary | ICD-10-CM

## 2019-12-08 DIAGNOSIS — E871 Hypo-osmolality and hyponatremia: Secondary | ICD-10-CM | POA: Diagnosis not present

## 2019-12-08 DIAGNOSIS — R269 Unspecified abnormalities of gait and mobility: Secondary | ICD-10-CM | POA: Diagnosis not present

## 2019-12-08 NOTE — Progress Notes (Signed)
Name: Sydney Mcdaniel   MRN: [id number]    DOB: Jan 20, 1925   Date:12/08/2019       Progress Note  Chief Complaint  Patient presents with  . Follow-up    1 month  . Gait Problem     Subjective:   Sydney Mcdaniel is a 86 y.o. female, presents to clinic for routine follow up on the conditions listed above.  F/up on hyponatremia- she has still decreased her free water     Hypertension:  Yesterday morning SBP was 109, but she states that changing what she drinks (differrent electrolyte drink) has spiked her BP and cardiology told her to stop drinking them (tried [employer] electrolyte diet drinks with decreased free water, her labs were much better after those changes and with our prior OV BP was well controlled was 136/62 last OV f/up for hyponatremia, and fairly well controlled for age today 69/76) No syncopal episodes since last seen, she does continue to have some lightheaded episodes that she manages by sitting down, resting and drinking fluids or a coke BP Readings from Last 10 Encounters:  12/08/19 (!) 142/76  12/03/19 (!) 182/47  11/12/19 (!) 180/70  11/10/19 (!) 174/50  11/04/19 136/62  10/21/19 (!) 160/62  10/13/19 (!) 165/44  09/15/19 (!) 165/68  08/18/19 (!) 152/65  07/18/19 (!) 190/68   LE edema is unchanged  She is concerned still with her gait and had not been contacted for PT She was told they needed an authorization number but never heard anything again I reviewed the prior referral - it appears  Authorized - we will f/up with referral coordinator/team   Patient Active Problem List   Diagnosis Date Noted  . 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 Sinus bradycardia 04/09/2019  . First degree AV block 04/09/2019  . Postural dizziness with presyncope 02/25/2019  . Osteoporosis 08/21/2018  . Osteoarthritis of left hip 05/21/2018  . Bilateral carotid artery stenosis 04/19/2018  . History of syncope 10/17/2017  . MCI (mild cognitive  impairment) 08/12/2017  . Lung nodules 07/09/2017  . Paroxysmal atrial fibrillation ([place]) 04/18/2017  . Pleural effusion 10/20/2016  . Hyponatremia 10/20/2016  . Chronic diastolic congestive heart failure ([place]) 06/28/2016  . Primary open-angle glaucoma, bilateral, mild stage 05/08/2016  . Labile blood glucose 04/27/2016  . Bladder prolapse, female, acquired 02/25/2016  . 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  . B12 deficiency 01/11/2016  . Anemia 01/10/2016  . CLL (chronic lymphocytic leukemia) ([place]) 01/10/2016  . 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 12/21/2015  . Bradycardia 12/21/2015  . Lymphocytosis 06/25/2015  . Impaired fasting glucose 06/22/2015  . Carotid atherosclerosis 06/22/2015  . Essential hypertension 12/22/2014  . Hyperlipidemia 12/22/2014  . Hypothyroidism 12/22/2014    Past Surgical History:  Procedure Laterality Date  . APPENDECTOMY    . EYE SURGERY     Glaucoma  . TOTAL ABDOMINAL HYSTERECTOMY      Family History  Problem Relation Age of Onset  . Heart attack Mother   . Glaucoma Mother   . Heart attack Father   . Parkinson's disease Brother   . Heart attack Brother     Social History   Tobacco Use  . Smoking status: Never Smoker  . Smokeless tobacco: Never Used  Vaping Use  . Vaping Use: Never used  Substance Use Topics  . Alcohol use: No  . Drug use: No      Current Outpatient  Medications:  .  ACCU-CHEK SOFTCLIX LANCETS lancets, Use as instructed; check once a day; H40.1131, Disp: 100 each, Rfl: 12 .  alendronate (FOSAMAX) 70 MG tablet, TAKE ONE TABLET BY MOUTH EVERY 7 DAYS. TAKE WITH A FULL GLASS OF WATER ON AN EMPTY STOMACH., Disp: 12 tablet, Rfl: 0 .  amLODipine (NORVASC) 5 MG tablet, Take 1 tablet (5 mg total) by mouth daily., Disp: 90 tablet, Rfl: 1 .  atorvastatin (LIPITOR) 20 MG tablet, Take 1 tablet (20 mg total) by mouth daily., Disp: 90 tablet, Rfl: 1 .  Calcium  Carb-Cholecalciferol (CALCIUM 600 + D PO), Take 1 tablet by mouth daily. , Disp: , Rfl:  .  cholecalciferol (VITAMIN D) 1000 units tablet, Take 1,000 Units by mouth daily., Disp: , Rfl:  .  COMBIGAN 0.2-0.5 % ophthalmic solution, , Disp: , Rfl:  .  Cyanocobalamin (B-12) 1000 MCG/ML KIT, Inject 1,000 mcg as directed every 30 (thirty) days., Disp: , Rfl:  .  famotidine (PEPCID) 20 MG tablet, Take 1 tablet (20 mg total) by mouth at bedtime., Disp: 30 tablet, Rfl: 5 .  glucose blood (ACCU-CHEK AVIVA PLUS) test strip, Use as instructed, check once a day; H40.1131, Disp: 100 each, Rfl: 3 .  IRON PO, Take 1 tablet by mouth 2 (two) times daily. , Disp: , Rfl:  .  latanoprost (XALATAN) 0.005 % ophthalmic solution, Place 1 drop into the right eye at bedtime. , Disp: , Rfl:  .  levothyroxine (EUTHYROX) 75 MCG tablet, TAKE ONE TABLET BY MOUTH ON MONDAYS, WEDNESDAYS, FRIDAYS, AND SUNDAYS., Disp: 52 tablet, Rfl: 3 .  levothyroxine (SYNTHROID) 88 MCG tablet, Take 1 tablet (88 mcg total) by mouth daily before breakfast. 21mg on Tues,Thurs,Sat, Disp: 39 tablet, Rfl: 3 .  lisinopril (ZESTRIL) 20 MG tablet, Take 1 tablet (20 mg total) by mouth daily., Disp: 90 tablet, Rfl: 2 .  Multiple Vitamins-Minerals (MULTIVITAMIN ADULT PO), Take by mouth., Disp: , Rfl:  .  Omega-3 Fatty Acids (FISH OIL) 1200 MG CAPS, Take by mouth daily., Disp: , Rfl:  .  rivaroxaban (XARELTO) 20 MG TABS tablet, Take 1 tablet (20 mg total) by mouth daily with supper., Disp: 90 tablet, Rfl: 2 .  timolol (BETIMOL) 0.5 % ophthalmic solution, Place 1 drop into the right eye 2 (two) times daily., Disp: , Rfl:   Allergies  Allergen Reactions  . Other   . Alphagan [Brimonidine] Itching and Rash  . Pravachol [Pravastatin Sodium] Itching and Rash  . Pravastatin Itching, Other (See Comments) and Rash    Chart Review Today: I personally reviewed active problem list, medication list, allergies, family history, social history, health maintenance,  notes from last encounter, lab results, imaging with the patient/caregiver today.   Review of Systems  10 Systems reviewed and are negative for acute change except as noted in the HPI.   Objective:    Vitals:   12/08/19 0851  BP: (!) 142/76  Pulse: 65  Resp: 14  Temp: 97.8 F (36.6 C)  SpO2: 98%  Weight: 142 lb 1.6 oz (64.5 kg)  Height: '5\' 3"'  (1.6 m)    Body mass index is 25.17 kg/m.  Physical Exam     PH[facility]2/9: [facility] 12/08/2019 11/04/2019 10/21/2019 07/08/2019 05/13/2019  Decreased Interest 0 0 0 0 0  Down, Depressed, Hopeless 0 0 0 0 0  PHQ - 2 Score 0 0 0 0 0  Altered sleeping 0 0 0 - 0  Tired, decreased energy 0 0 0 - 0  Change in appetite 0 0 0 - 0  Feeling bad or failure about yourself  0 0 0 - 0  Trouble concentrating 0 0 0 - 0  Moving slowly or fidgety/restless 0 0 0 - 0  Suicidal thoughts 0 0 0 - 0  PHQ-9 Score 0 0 0 - 0  Difficult doing work/chores Not difficult at all Not difficult at all Not difficult at all - Not difficult at all  Some recent data might be hidden    phq 9 is neg, reviewed  Fall Risk: Fall Risk  12/08/2019 11/04/2019 10/21/2019 07/08/2019 05/13/2019  Falls in the past year? 0 0 0 0 0  Number falls in past yr: 0 0 0 0 0  Comment - - - - -  Injury with Fall? 0 0 0 0 0  Comment - - - - -  Risk Factor Category  - - - - -  Risk for fall due to : - - - History of fall(s);Impaired balance/gait;Impaired vision -  Follow up - - - Falls prevention discussed Falls evaluation completed    Functional Status Survey: Is the patient deaf or have difficulty hearing?: No Does the patient have difficulty seeing, even when wearing glasses/contacts?: No Does the patient have difficulty concentrating, remembering, or making decisions?: No Does the patient have difficulty walking or climbing stairs?: No Does the patient have difficulty dressing or bathing?: No Does the patient have difficulty doing errands alone such as visiting a  doctor's office or shopping?: No   Assessment & Plan:     ICD-10-CM   1. Hyponatremia  E87.1    last labs reviewed, she had mildly low sodium again, but not nearly as bad and previously, continue to limit free water to 1.5-2L, and continue other types of drinks with some electrolytes throughout the day (smaller amounts) - doing this seemed to improve her electrolytes significantly   2. Abnormality of gait and mobility  R26.9    Not into PT yet - previously referred to neuroPT/PT for abnormality of gait and balance and generalized LE weakness sx - not new   3. Essential hypertension  I10    OV here BP has been well controlled, more elevated at other OV, suggested pt intermittently monitor BP at home - no med changes, cardiology has been managing Possibly white coat HTN at cardiology?  Pt has no sx associated with high readings Noted lower readings at home, with hx of syncope Her current management of lightheaded episodes/near syncope is good - continue     Return for 4 month f/up TSH, HLD, HTN .   Delsa Grana, PA-C 12/08/19 9:12 AM

## 2019-12-11 ENCOUNTER — Encounter: Payer: Self-pay | Admitting: Hematology and Oncology

## 2019-12-15 ENCOUNTER — Ambulatory Visit: Payer: Medicare Other

## 2019-12-15 ENCOUNTER — Other Ambulatory Visit: Payer: Self-pay

## 2019-12-15 ENCOUNTER — Inpatient Hospital Stay: Payer: Medicare Other

## 2019-12-15 DIAGNOSIS — C9110 Chronic lymphocytic leukemia of B-cell type not having achieved remission: Secondary | ICD-10-CM | POA: Diagnosis not present

## 2019-12-15 DIAGNOSIS — E538 Deficiency of other specified B group vitamins: Secondary | ICD-10-CM

## 2019-12-15 MED ORDER — CYANOCOBALAMIN 1000 MCG/ML IJ SOLN
1000.0000 ug | Freq: Once | INTRAMUSCULAR | Status: AC
  Administered 2019-12-15: 1000 ug via INTRAMUSCULAR

## 2019-12-15 NOTE — Patient Instructions (Signed)

## 2019-12-30 ENCOUNTER — Other Ambulatory Visit: Payer: Self-pay | Admitting: Family Medicine

## 2019-12-30 DIAGNOSIS — E039 Hypothyroidism, unspecified: Secondary | ICD-10-CM

## 2020-01-05 ENCOUNTER — Ambulatory Visit: Payer: Medicare Other | Admitting: Nurse Practitioner

## 2020-01-05 ENCOUNTER — Other Ambulatory Visit: Payer: Medicare Other

## 2020-01-05 ENCOUNTER — Ambulatory Visit: Payer: Medicare Other

## 2020-01-12 ENCOUNTER — Inpatient Hospital Stay: Payer: Medicare Other | Attending: Hematology and Oncology

## 2020-01-12 ENCOUNTER — Other Ambulatory Visit: Payer: Medicare Other

## 2020-01-12 ENCOUNTER — Other Ambulatory Visit: Payer: Self-pay

## 2020-01-12 ENCOUNTER — Ambulatory Visit: Payer: Medicare Other | Admitting: Hematology and Oncology

## 2020-01-12 ENCOUNTER — Ambulatory Visit: Payer: Medicare Other

## 2020-01-12 ENCOUNTER — Inpatient Hospital Stay: Payer: Medicare Other

## 2020-01-12 DIAGNOSIS — E538 Deficiency of other specified B group vitamins: Secondary | ICD-10-CM | POA: Diagnosis not present

## 2020-01-12 DIAGNOSIS — C9110 Chronic lymphocytic leukemia of B-cell type not having achieved remission: Secondary | ICD-10-CM

## 2020-01-12 LAB — CBC WITH DIFFERENTIAL/PLATELET
Abs Immature Granulocytes: 0.01 10*3/uL (ref 0.00–0.07)
Basophils Absolute: 0 10*3/uL (ref 0.0–0.1)
Basophils Relative: 0 %
Eosinophils Absolute: 0.1 10*3/uL (ref 0.0–0.5)
Eosinophils Relative: 2 %
HCT: 29.8 % — ABNORMAL LOW (ref 36.0–46.0)
Hemoglobin: 9.8 g/dL — ABNORMAL LOW (ref 12.0–15.0)
Immature Granulocytes: 0 %
Lymphocytes Relative: 52 %
Lymphs Abs: 4.3 10*3/uL — ABNORMAL HIGH (ref 0.7–4.0)
MCH: 31 pg (ref 26.0–34.0)
MCHC: 32.9 g/dL (ref 30.0–36.0)
MCV: 94.3 fL (ref 80.0–100.0)
Monocytes Absolute: 0.8 10*3/uL (ref 0.1–1.0)
Monocytes Relative: 9 %
Neutro Abs: 3 10*3/uL (ref 1.7–7.7)
Neutrophils Relative %: 37 %
Platelets: 176 10*3/uL (ref 150–400)
RBC: 3.16 MIL/uL — ABNORMAL LOW (ref 3.87–5.11)
RDW: 14.4 % (ref 11.5–15.5)
WBC: 8.2 10*3/uL (ref 4.0–10.5)
nRBC: 0 % (ref 0.0–0.2)

## 2020-01-12 MED ORDER — CYANOCOBALAMIN 1000 MCG/ML IJ SOLN
1000.0000 ug | Freq: Once | INTRAMUSCULAR | Status: AC
  Administered 2020-01-12: 1000 ug via INTRAMUSCULAR

## 2020-01-12 MED ORDER — CYANOCOBALAMIN 1000 MCG/ML IJ SOLN
INTRAMUSCULAR | Status: AC
  Filled 2020-01-12: qty 1

## 2020-01-21 ENCOUNTER — Encounter: Payer: Self-pay | Admitting: Hematology and Oncology

## 2020-02-09 ENCOUNTER — Other Ambulatory Visit: Payer: Self-pay

## 2020-02-09 ENCOUNTER — Inpatient Hospital Stay: Payer: Medicare Other | Attending: Hematology and Oncology

## 2020-02-09 DIAGNOSIS — E538 Deficiency of other specified B group vitamins: Secondary | ICD-10-CM | POA: Diagnosis not present

## 2020-02-09 MED ORDER — CYANOCOBALAMIN 1000 MCG/ML IJ SOLN
1000.0000 ug | Freq: Once | INTRAMUSCULAR | Status: AC
  Administered 2020-02-09: 1000 ug via INTRAMUSCULAR

## 2020-02-09 MED ORDER — CYANOCOBALAMIN 1000 MCG/ML IJ SOLN
INTRAMUSCULAR | Status: AC
  Filled 2020-02-09: qty 1

## 2020-02-19 ENCOUNTER — Other Ambulatory Visit: Payer: Self-pay

## 2020-02-19 ENCOUNTER — Ambulatory Visit: Payer: Medicare Other | Attending: Family Medicine | Admitting: Physical Therapy

## 2020-02-19 ENCOUNTER — Encounter: Payer: Self-pay | Admitting: Physical Therapy

## 2020-02-19 DIAGNOSIS — R262 Difficulty in walking, not elsewhere classified: Secondary | ICD-10-CM | POA: Insufficient documentation

## 2020-02-19 DIAGNOSIS — M6281 Muscle weakness (generalized): Secondary | ICD-10-CM | POA: Diagnosis not present

## 2020-02-19 NOTE — Therapy (Signed)
[place] MAIN [facility] SERVICES [address] [place], [place], [address] Phone: [phone]   Fax:  [phone]  Physical Therapy Evaluation  Patient Details  Name: Sydney Mcdaniel MRN: [id number] Date of Birth: [birth year] Referring Provider (PT): Delsa Grana   Encounter Date: 02/19/2020   PT End of Session - 02/19/20 1014    Visit Number 1    Number of Visits 17    Date for PT Re-Evaluation 04/14/20    PT Start Time 1010    PT Stop Time 1100    PT Time Calculation (min) 50 min    Equipment Utilized During Treatment Gait belt    Activity Tolerance Patient tolerated treatment well    Behavior During Therapy WFL for tasks assessed/performed           Past Medical History:  Diagnosis Date  . Allergy   . Anemia   . Cataract   . CLL (chronic lymphocytic leukemia) ([place])   . Glaucoma   . H/O: hysterectomy    Total  . Hyperlipidemia   . Hypertension   . Hypothyroidism   . Impaired fasting glucose   . Lichen sclerosus   . Osteoporosis    Hips  . PAF (paroxysmal atrial fibrillation) ([place])    a. 06/15/2016 Event monitor: 4% afib burden; b. CHA2DS2VASc - 4-->Xarelto.  . Syncope    a. 03/2016 Echo: EF55-60%, no rwma, mild AI/MR, nl PASP; b. 03/2016 48h Holter: no significant arrhythmias/pauses; c. 03/2016 MV: mild apical defect, likely breast attenuation, nl EF, low risk; d. 05/2016 Event monitor: No significant arrhythmia; e. 05/2016 Event monitor: PAF (4%).    Past Surgical History:  Procedure Laterality Date  . APPENDECTOMY    . EYE SURGERY     Glaucoma  . TOTAL ABDOMINAL HYSTERECTOMY      There were no vitals filed for this visit.    Subjective Assessment - 02/19/20 1020    Subjective Patient would like to have better balance.    Pertinent History Patient had HHPT 10 months ago. Before that she was in the hospital with BP issues over night. She was walking with spc outdoors, inside she holds to the walls.    Limitations  Walking;Standing    How long can you stand comfortably? 15 minutes    How long can you walk comfortably? 5 mins    Patient Stated Goals to have better balance and walk better    Currently in Pain? Yes    Pain Score 0-No pain    Pain Location Back    Pain Orientation Lower    Pain Descriptors / Indicators Aching    Pain Frequency Intermittent    Aggravating Factors  unaware    Pain Relieving Factors tylenol    Effect of Pain on Daily Activities slows her down    Multiple Pain Sites No              [facility] PT Assessment - 02/19/20 1024      Assessment   Medical Diagnosis abnormality of gait    Referring Provider (PT) Delsa Grana    Onset Date/Surgical Date 11/04/19    Hand Dominance Right      Precautions   Precautions None      Restrictions   Weight Bearing Restrictions No      Balance Screen   Has the patient fallen in the past 6 months No    Has the patient had a decrease in activity level because of a fear  of falling?  Yes    Is the patient reluctant to leave their home because of a fear of falling?  Yes      [place] Private residence    Living Arrangements Alone    Available Help at Discharge [place] to enter    Entrance Stairs-Number of Steps 2    [place] One level    [place] - 2 wheels;Cane - single point;Shower seat;Grab bars - toilet;Grab bars - tub/shower      Prior Function   Level of Independence Independent with household mobility with device    Vocation Retired    Leisure cooking      Cognition   Overall Cognitive Status Within Functional Limits for tasks assessed      Standardized Balance Assessment   Standardized Balance Assessment Chief Technology Officer Test   Sit to Stand Able to stand  independently using hands    Standing Unsupported Able to stand safely 2 minutes    Sitting with Back Unsupported but Feet Supported on Floor or  Stool Able to sit safely and securely 2 minutes    Stand to Sit Controls descent by using hands    Transfers Able to transfer safely, definite need of hands    Standing Unsupported with Eyes Closed Able to stand 10 seconds with supervision    Standing Unsupported with Feet Together Needs help to attain position but able to stand for 30 seconds with feet together    From Standing, Reach Forward with Outstretched Arm Can reach forward >5 cm safely (2")    From Standing Position, Pick up Object from Floor Unable to pick up and needs supervision    From Standing Position, Turn to Look Behind Over each Shoulder Turn sideways only but maintains balance    Turn 360 Degrees Needs close supervision or verbal cueing    Standing Unsupported, Alternately Place Feet on Step/Stool Able to complete >2 steps/needs minimal assist    Standing Unsupported, One Foot in [employer] balance while stepping or standing    Standing on One Leg Unable to try or needs assist to prevent fall    Total Score 28              PAIN: Back pain 0/10- 7/10  POSTURE: WFL   PROM/AROM: PROM BUE:WFL AROM BUEWFL PROM BLEWFL AROM BLE:WFL  STRENGTH:  Graded on a 0-5 scale Muscle Group Left Right                          Hip Flex 3/5 3+/5  Hip Abd 3/5 3+/5  Hip Add -3/5 -3/5          Knee Flex -4/5 -4/5  Knee Ext 5/5 5/5  Ankle DF 4/5 4/5  Ankle PF 4/5 4/5   SENSATION:  BUE : WNL BLE : WNL     SOMATOSENSORY:  Any N & T in extremities or weakness: reports :         Sensation           Intact      Diminished         Absent  Light touch LEs                              .  FUNCTIONAL MOBILITY: Transfers need UE sit to stand with spc   BALANCE: Standing Dynamic Balance  Normal Stand independently unsupported, able to weight shift and cross midline maximally   Good Stand independently unsupported, able to weight shift and cross midline moderately   Good-/Fair+ Stand independently unsupported, able  to weight shift across midline minimally   Fair Stand independently unsupported, weight shift, and reach ipsilaterally, loss of balance when crossing midline x  Poor+ Able to stand with Min A and reach ipsilaterally, unable to weight shift   Poor Able to stand with Mod A and minimally reach ipsilaterally, unable to cross midline.    Static Standing Balance  Normal Able to maintain standing balance against maximal resistance   Good Able to maintain standing balance against moderate resistance   Good-/Fair+ Able to maintain standing balance against minimal resistance   Fair Able to stand unsupported without UE support and without LOB for 1-2 min x  Fair- Requires Min A and UE support to maintain standing without loss of balance   Poor+ Requires mod A and UE support to maintain standing without loss of balance   Poor Requires max A and UE support to maintain standing balance without loss       GAIT: Patient ambulates with spc on level surfaces and CGA for short distances and decreased gait speed.  OUTCOME MEASURES: TEST Outcome Interpretation  5 times sit<>stand 26.25sec >42 yo, >15 sec indicates increased risk for falls  10 meter walk test   47.77              m/s <1.0 m/s indicates increased risk for falls; limited community ambulator  Timed up and Go     27.24            sec <14 sec indicates increased risk for falls      Berg Balance Assessment 28/56 <36/56 (100% risk for falls), 37-45 (80% risk for falls); 46-51 (>50% risk for falls); 52-55 (lower risk <25% of falls)        HEP: Heel raises with 2 sec hold x 20 x 2 sets/ day        Objective measurements completed on examination: See above findings.               PT Education - 02/19/20 1014    Education Details POC    Person(s) Educated Patient    Methods Explanation    Comprehension Verbalized understanding            PT Short Term Goals - 02/19/20 1434      PT SHORT TERM GOAL #1   Title Patient (>  66 years old) will complete five times sit to stand test in < 15 seconds indicating an increased LE strength and improved balance.    Time 4    Period Weeks    Status New             PT Long Term Goals - 02/19/20 1431      PT LONG TERM GOAL #1   Title Patient will increase BLE gross strength to 4+/5 as to improve functional strength for independent gait, increased standing tolerance and increased ADL ability.    Time 8    Period Weeks    Status New    Target Date 04/14/20      PT LONG TERM GOAL #2   Title Patient will increase Berg Balance score by > 6 points to demonstrate decreased fall risk during functional activities.  Time 8    Period Weeks    Status New    Target Date 04/14/20      PT LONG TERM GOAL #3   Title Patient will reduce timed up and go to <11 seconds to reduce fall risk and demonstrate improved transfer/gait ability.    Time 8    Status New    Target Date 04/14/20      PT LONG TERM GOAL #4   Title Patient will increase 10 meter walk test to >1.77m/s as to improve gait speed for better community ambulation and to reduce fall risk.    Time 8    Period Weeks    Status New    Target Date 04/14/20                  Plan - 02/19/20 1016    Clinical Impression Statement Patient presents with decreased BLE strength, decreased gait speed, decreased standing balance.  Patient's main complaint is inability to participate in desired activities. Patient will benefit from skilled therapy in order to increase LE strength and standing balance and increase gait speed and improve her ability to participate in desired activities and decrease her falls risk.    Personal Factors and Comorbidities Age;Comorbidity 1;Comorbidity 2    Comorbidities HTN, osteoporosis, visual impairment, back pain, incontenence, Cancer    Examination-Activity Limitations Locomotion Level;Stairs    Stability/Clinical Decision Making Stable/Uncomplicated    Clinical Decision Making Low     PT Frequency 2x / week    PT Duration 8 weeks    PT Treatment/Interventions Neuromuscular re-education;Therapeutic exercise;Therapeutic activities;Balance training;Functional mobility training;Gait training           Patient will benefit from skilled therapeutic intervention in order to improve the following deficits and impairments:  Abnormal gait, Decreased activity tolerance, Decreased endurance, Decreased knowledge of use of DME, Impaired flexibility, Difficulty walking, Decreased balance, Pain  Visit Diagnosis: Difficulty in walking, not elsewhere classified  Muscle weakness (generalized)     Problem List Patient Active Problem List   Diagnosis Date Noted  . 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 Sinus bradycardia 04/09/2019  . First degree AV block 04/09/2019  . Postural dizziness with presyncope 02/25/2019  . Osteoporosis 08/21/2018  . Osteoarthritis of left hip 05/21/2018  . Bilateral carotid artery stenosis 04/19/2018  . History of syncope 10/17/2017  . MCI (mild cognitive impairment) 08/12/2017  . Lung nodules 07/09/2017  . Paroxysmal atrial fibrillation ([place]) 04/18/2017  . Pleural effusion 10/20/2016  . Hyponatremia 10/20/2016  . Chronic diastolic congestive heart failure ([place]) 06/28/2016  . Primary open-angle glaucoma, bilateral, mild stage 05/08/2016  . Labile blood glucose 04/27/2016  . Bladder prolapse, female, acquired 02/25/2016  . 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  . B12 deficiency 01/11/2016  . Anemia 01/10/2016  . CLL (chronic lymphocytic leukemia) ([place]) 01/10/2016  . 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 12/21/2015  . Bradycardia 12/21/2015  . Lymphocytosis 06/25/2015  . Impaired fasting glucose 06/22/2015  . Carotid atherosclerosis 06/22/2015  . Essential hypertension 12/22/2014  . Hyperlipidemia 12/22/2014  . Hypothyroidism 12/22/2014    Alanson Puls, PT  DPT 02/19/2020, 2:35 PM  [place] MAIN [facility] SERVICES [address] [place], [place], [address] Phone: [phone]   Fax:  [phone]  Name: Sydney Mcdaniel MRN: [id number] Date of Birth: 09-06-1924

## 2020-02-23 ENCOUNTER — Encounter: Payer: Self-pay | Admitting: Physical Therapy

## 2020-02-23 ENCOUNTER — Other Ambulatory Visit: Payer: Self-pay

## 2020-02-23 ENCOUNTER — Ambulatory Visit: Payer: Medicare Other | Admitting: Physical Therapy

## 2020-02-23 DIAGNOSIS — E119 Type 2 diabetes mellitus without complications: Secondary | ICD-10-CM | POA: Diagnosis not present

## 2020-02-23 DIAGNOSIS — M6281 Muscle weakness (generalized): Secondary | ICD-10-CM

## 2020-02-23 DIAGNOSIS — R262 Difficulty in walking, not elsewhere classified: Secondary | ICD-10-CM | POA: Diagnosis not present

## 2020-02-23 NOTE — Therapy (Signed)
[place] MAIN [facility] SERVICES [address] [place], [place], [address] Phone: [phone]   Fax:  [phone]  Physical Therapy Treatment  Patient Details  Name: Sydney Mcdaniel MRN: [id number] Date of Birth: Jul 12, 1924 Referring Provider (PT): Delsa Grana   Encounter Date: 02/23/2020   PT End of Session - 02/23/20 1149    Visit Number 2    Number of Visits 17    Date for PT Re-Evaluation 04/14/20    PT Start Time 3295    PT Stop Time 1225    PT Time Calculation (min) 40 min    Equipment Utilized During Treatment Gait belt    Activity Tolerance Patient tolerated treatment well    Behavior During Therapy WFL for tasks assessed/performed           Past Medical History:  Diagnosis Date   Allergy    Anemia    Cataract    CLL (chronic lymphocytic leukemia) ([place])    Glaucoma    H/O: hysterectomy    Total   Hyperlipidemia    Hypertension    Hypothyroidism    Impaired fasting glucose    Lichen sclerosus    Osteoporosis    Hips   PAF (paroxysmal atrial fibrillation) ([place])    a. 06/15/2016 Event monitor: 4% afib burden; b. CHA2DS2VASc - 4-->Xarelto.   Syncope    a. 03/2016 Echo: EF55-60%, no rwma, mild AI/MR, nl PASP; b. 03/2016 48h Holter: no significant arrhythmias/pauses; c. 03/2016 MV: mild apical defect, likely breast attenuation, nl EF, low risk; d. 05/2016 Event monitor: No significant arrhythmia; e. 05/2016 Event monitor: PAF (4%).    Past Surgical History:  Procedure Laterality Date   APPENDECTOMY     EYE SURGERY     Glaucoma   TOTAL ABDOMINAL HYSTERECTOMY      There were no vitals filed for this visit.   Subjective Assessment - 02/23/20 1148    Subjective Patient would like to have better balance.    Pertinent History Patient had HHPT 10 months ago. Before that she was in the hospital with BP issues over night. She was walking with spc outdoors, inside she holds to the walls.    Limitations  Walking;Standing    How long can you stand comfortably? 15 minutes    How long can you walk comfortably? 5 mins    Patient Stated Goals to have better balance and walk better           Ther-ex  Nu-step  x 5 mins , L2 Hip flexion marches with 1 1/2# ankle weights x 10 bilateral; Hip abduction with 1 1/2# x 10 bilateral Hip extension with 1 1/2# x 10 bilateral Sit to stand without UE support from regular height chair x 10   Lunges to BOSU ball x 15 BLE Heel raises x 15 x 2 sets Step ups to 6-inch stool x 20  Eccentric step downs from 3 inch stool x 10 verbal cues to complete slow and tap heel  BUE CGA Reviewed HEP : Reviewed how to tie theraband and do hip exercise.   Patient needs occasional verbal cueing to improve posture and cueing to correctly perform exercises slowly, holding at end of range to increase motor firing of desired muscle to encourage fatigue.                           PT Education - 02/23/20 1148    Education Details HEP  Person(s) Educated Patient    Methods Explanation    Comprehension Verbalized understanding            PT Short Term Goals - 02/19/20 1434      PT SHORT TERM GOAL #1   Title Patient (> 65 years old) will complete five times sit to stand test in < 15 seconds indicating an increased LE strength and improved balance.    Time 4    Period Weeks    Status New             PT Long Term Goals - 02/19/20 1431      PT LONG TERM GOAL #1   Title Patient will increase BLE gross strength to 4+/5 as to improve functional strength for independent gait, increased standing tolerance and increased ADL ability.    Time 8    Period Weeks    Status New    Target Date 04/14/20      PT LONG TERM GOAL #2   Title Patient will increase Berg Balance score by > 6 points to demonstrate decreased fall risk during functional activities.    Time 8    Period Weeks    Status New    Target Date 04/14/20      PT LONG TERM GOAL #3    Title Patient will reduce timed up and go to <11 seconds to reduce fall risk and demonstrate improved transfer/gait ability.    Time 8    Status New    Target Date 04/14/20      PT LONG TERM GOAL #4   Title Patient will increase 10 meter walk test to >1.66m/s as to improve gait speed for better community ambulation and to reduce fall risk.    Time 8    Period Weeks    Status New    Target Date 04/14/20                 Plan - 02/23/20 1149    Clinical Impression Statement Patient instructed in beginning balance and coordination exercise. Patient required mod VCs and min A for gait to improve weight shift and postural control. Patient requires min VCs to improve ankle stability with gait.  Patients would benefit from additional skilled PT intervention to improve balance/gait safety and reduce fall risk.   Personal Factors and Comorbidities Age;Comorbidity 1;Comorbidity 2    Comorbidities HTN, osteoporosis, visual impairment, back pain, incontenence, Cancer    Examination-Activity Limitations Locomotion Level;Stairs    Stability/Clinical Decision Making Stable/Uncomplicated    PT Frequency 2x / week    PT Duration 8 weeks    PT Treatment/Interventions Neuromuscular re-education;Therapeutic exercise;Therapeutic activities;Balance training;Functional mobility training;Gait training           Patient will benefit from skilled therapeutic intervention in order to improve the following deficits and impairments:  Abnormal gait, Decreased activity tolerance, Decreased endurance, Decreased knowledge of use of DME, Impaired flexibility, Difficulty walking, Decreased balance, Pain  Visit Diagnosis: Muscle weakness (generalized)  Difficulty in walking, not elsewhere classified     Problem List Patient Active Problem List   Diagnosis Date Noted   Positive direct antiglobulin test (DAT) 12/04/2019   Sinus bradycardia 04/09/2019   First degree AV block 04/09/2019   Postural  dizziness with presyncope 02/25/2019   Osteoporosis 08/21/2018   Osteoarthritis of left hip 05/21/2018   Bilateral carotid artery stenosis 04/19/2018   History of syncope 10/17/2017   MCI (mild cognitive impairment) 08/12/2017   Lung nodules 07/09/2017  Paroxysmal atrial fibrillation (HCC) 04/18/2017   Pleural effusion 10/20/2016   Hyponatremia 10/20/2016   Chronic diastolic congestive heart failure ([place]) 06/28/2016   Primary open-angle glaucoma, bilateral, mild stage 05/08/2016   Labile blood glucose 04/27/2016   Bladder prolapse, female, acquired 02/25/2016   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   B12 deficiency 01/11/2016   Anemia 01/10/2016   CLL (chronic lymphocytic leukemia) ([place]) 01/10/2016   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 12/21/2015   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 12/21/2015   Bradycardia 12/21/2015   Lymphocytosis 06/25/2015   Impaired fasting glucose 06/22/2015   Carotid atherosclerosis 06/22/2015   Essential hypertension 12/22/2014   Hyperlipidemia 12/22/2014   Hypothyroidism 12/22/2014    Alanson Puls, PT DPT 02/23/2020, 11:50 AM  [place] [address] [place], [place], [address] Phone: [phone]   Fax:  [phone]  Name: DACEY MILBERGER MRN: [id number] Date of Birth: 20-Dec-1924

## 2020-02-23 NOTE — Patient Instructions (Addendum)
HIP: Abduction - Standing (Band)    Place band around legs. Squeeze glutes. Raise leg out and slightly back. Hold ___ seconds. Use ________ band. ___ reps per set, ___ sets per day, ___ days per week Hold onto a support.  Copyright  VHI. All rights reserved.   Sit-to-Stand Exercise  The sit-to-stand exercise (also known as the chair stand or chair rise exercise) strengthens your lower body and helps you maintain or improve your mobility and independence. The goal is to do the sit-to-stand exercise without using your hands. This will be easier as you become stronger. You should always talk with your health care provider before starting any exercise program, especially if you have had recent surgery. Do the exercise exactly as told by your health care provider and adjust it as directed. It is normal to feel mild stretching, pulling, tightness, or discomfort as you do this exercise, but you should stop right away if you feel sudden pain or your pain gets worse. Do not begin doing this exercise until told by your health care provider. What the sit-to-stand exercise does The sit-to-stand exercise helps to strengthen the muscles in your thighs and the muscles in the center of your body that give you stability (core muscles). This exercise is especially helpful if:  You have had knee or hip surgery.  You have trouble getting up from a chair, out of a car, or off the toilet. How to do the sit-to-stand exercise 1. Sit toward the front edge of a sturdy chair without armrests. Your knees should be bent and your feet should be flat on the floor and shoulder-width apart. 2. Place your hands lightly on each side of the seat. Keep your back and neck as straight as possible, with your chest slightly forward. 3. Breathe in slowly. Lean forward and slightly shift your weight to the front of your feet. 4. Breathe out as you slowly stand up. Use your hands as little as possible. 5. Stand and pause for a full  breath in and out. 6. Breathe in as you sit down slowly. Tighten your core and abdominal muscles to control your lowering as much as possible. 7. Breathe out slowly. 8. Do this exercise 10-15 times. If needed, do it fewer times until you build up strength. 9. Rest for 1 minute, then do another set of 10-15 repetitions. To change the difficulty of the sit-to-stand exercise  If the exercise is too difficult, use a chair with sturdy armrests, and push off the armrests to help you come to the standing position. You can also use the armrests to help slowly lower yourself back to sitting. As this gets easier, try to use your arms less. You can also place a firm cushion or pillow on the chair to make the surface higher.  If this exercise is too easy, do not use your arms to help raise or lower yourself. You can also wear a weighted vest, use hand weights, increase your repetitions, or try a lower chair. General tips  You may feel tired when starting an exercise routine. This is normal.  You may have muscle soreness that lasts a few days. This is normal. As you get stronger, you may not feel muscle soreness.  Use smooth, steady movements.  Do not  hold your breath during strength exercises. This can cause unsafe changes in your blood pressure.  Breathe in slowly through your nose, and breathe out slowly through your mouth. Summary  Strengthening your lower body is an  important step to help you move safely and independently.  The sit-to-stand exercise helps strengthen the muscles in your thighs and core.  You should always talk with your health care provider before starting any exercise program, especially if you have had recent surgery. This information is not intended to replace advice given to you by your health care provider. Make sure you discuss any questions you have with your health care provider. Document Revised: 04/10/2018 Document Reviewed: 08/03/2016 Elsevier Patient Education   2020 [place] with support. Tighten pelvic floor and hold. With knees straight, raise heels off ground. Hold ___ seconds. Relax for ___ seconds. Repeat ___ times. Do ___ times a day.  Copyright  VHI. All rights reserved.  FUNCTIONAL MOBILITY: Marching - Standing    March in place by lifting left leg up, then right. Alternate. ___ reps per set, ___ sets per day, ___ days per week Hold onto a support.  Copyright  VHI. All rights reserved.  Hip Extension (Standing)    Stand with support. Squeeze pelvic floor and hold. Move right leg backward with straight knee. Hold for ___ seconds. Relax for ___ seconds. Repeat ___ times. Do ___ times a day. Repeat with other leg. Add ___ lb weight.   Copyright  VHI. All rights reserved.

## 2020-02-25 ENCOUNTER — Ambulatory Visit: Payer: Medicare Other | Attending: Family Medicine | Admitting: Physical Therapy

## 2020-02-25 ENCOUNTER — Other Ambulatory Visit: Payer: Self-pay

## 2020-02-25 ENCOUNTER — Encounter: Payer: Self-pay | Admitting: Physical Therapy

## 2020-02-25 DIAGNOSIS — R262 Difficulty in walking, not elsewhere classified: Secondary | ICD-10-CM | POA: Diagnosis not present

## 2020-02-25 DIAGNOSIS — M6281 Muscle weakness (generalized): Secondary | ICD-10-CM | POA: Insufficient documentation

## 2020-02-25 NOTE — Therapy (Signed)
[place] MAIN [facility] SERVICES [address] [place], [place], [address] Phone: [phone]   Fax:  [phone]  Physical Therapy Treatment  Patient Details  Name: Sydney Mcdaniel MRN: [id number] Date of Birth: 09/12/1924 Referring Provider (PT): Delsa Grana   Encounter Date: 02/25/2020   PT End of Session - 02/25/20 1135    Visit Number 3    Number of Visits 17    Date for PT Re-Evaluation 04/14/20    PT Start Time 1130    PT Stop Time 1215    PT Time Calculation (min) 45 min    Equipment Utilized During Treatment Gait belt    Activity Tolerance Patient tolerated treatment well    Behavior During Therapy [facility] for tasks assessed/performed           Past Medical History:  Diagnosis Date  . Allergy   . Anemia   . Cataract   . CLL (chronic lymphocytic leukemia) ([place])   . Glaucoma   . H/O: hysterectomy    Total  . Hyperlipidemia   . Hypertension   . Hypothyroidism   . Impaired fasting glucose   . Lichen sclerosus   . Osteoporosis    Hips  . PAF (paroxysmal atrial fibrillation) ([place])    a. 06/15/2016 Event monitor: 4% afib burden; b. CHA2DS2VASc - 4-->Xarelto.  . Syncope    a. 03/2016 Echo: EF55-60%, no rwma, mild AI/MR, nl PASP; b. 03/2016 48h Holter: no significant arrhythmias/pauses; c. 03/2016 MV: mild apical defect, likely breast attenuation, nl EF, low risk; d. 05/2016 Event monitor: No significant arrhythmia; e. 05/2016 Event monitor: PAF (4%).    Past Surgical History:  Procedure Laterality Date  . APPENDECTOMY    . EYE SURGERY     Glaucoma  . TOTAL ABDOMINAL HYSTERECTOMY      There were no vitals filed for this visit.   Subjective Assessment - 02/25/20 1134    Subjective Patient would like to have better balance.    Pertinent History Patient had HHPT 10 months ago. Before that she was in the hospital with BP issues over night. She was walking with spc outdoors, inside she holds to the walls.    Limitations  Walking;Standing    How long can you stand comfortably? 15 minutes    How long can you walk comfortably? 5 mins    Patient Stated Goals to have better balance and walk better    Currently in Pain? No/denies    Pain Score 0-No pain            Treatment: Neuromuscular Re-education  Rocker board fwd/bwd, side to side x 20 each direction 1/2 foam roll balance with flat side up 30s x 2 reps Lateral side steps from foam to 6 inch stool left and right x 15 Foam standing with head turns x 20 x 2 sets [place] fwd/bwd, side to side , diagonal x 10 ,cues for posture and stepping strategies, occasional LOB Stepping over hurdle fwd/bwd, side to side x 20       Pt educated throughout session about proper posture and technique with exercises. Improved exercise technique, movement at target joints, use of target muscles after min to mod verbal, visual, tactile cues. CGA and Min to mod verbal cues used throughout with increased in postural sway and LOB most seen with narrow base of support and while on uneven surfaces. Continues to have balance deficits typical with diagnosis. Patient performs intermediate level exercises without pain behaviors and needs  verbal cuing for postural alignment and head positioning                        PT Education - 02/25/20 1134    Education Details HEP    Person(s) Educated Patient    Methods Explanation    Comprehension Verbalized understanding            PT Short Term Goals - 02/19/20 1434      PT SHORT TERM GOAL #1   Title Patient (86 years old) will complete five times sit to stand test in < 15 seconds indicating an increased LE strength and improved balance.    Time 4    Period Weeks    Status New             PT Long Term Goals - 02/19/20 1431      PT LONG TERM GOAL #1   Title Patient will increase BLE gross strength to 4+/5 as to improve functional strength for independent gait, increased standing tolerance and  increased ADL ability.    Time 8    Period Weeks    Status New    Target Date 04/14/20      PT LONG TERM GOAL #2   Title Patient will increase Berg Balance score by > 6 points to demonstrate decreased fall risk during functional activities.    Time 8    Period Weeks    Status New    Target Date 04/14/20      PT LONG TERM GOAL #3   Title Patient will reduce timed up and go to <11 seconds to reduce fall risk and demonstrate improved transfer/gait ability.    Time 8    Status New    Target Date 04/14/20      PT LONG TERM GOAL #4   Title Patient will increase 10 meter walk test to >1.87m/s as to improve gait speed for better community ambulation and to reduce fall risk.    Time 8    Period Weeks    Status New    Target Date 04/14/20                 Plan - 02/25/20 1135    Clinical Impression Statement Patient instructed in beginning balance and coordination exercise. Patient required mod VCs and min A for gait to improve weight shift and postural control. Patient requires min VCs to improve ankle stability with gait.  Patients would benefit from additional skilled PT intervention to improve balance/gait safety and reduce fall risk.   Personal Factors and Comorbidities Age;Comorbidity 1;Comorbidity 2    Comorbidities HTN, osteoporosis, visual impairment, back pain, incontenence, Cancer    Examination-Activity Limitations Locomotion Level;Stairs    Stability/Clinical Decision Making Stable/Uncomplicated    PT Frequency 2x / week    PT Duration 8 weeks    PT Treatment/Interventions Neuromuscular re-education;Therapeutic exercise;Therapeutic activities;Balance training;Functional mobility training;Gait training           Patient will benefit from skilled therapeutic intervention in order to improve the following deficits and impairments:  Abnormal gait, Decreased activity tolerance, Decreased endurance, Decreased knowledge of use of DME, Impaired flexibility, Difficulty  walking, Decreased balance, Pain  Visit Diagnosis: Difficulty in walking, not elsewhere classified  Muscle weakness (generalized)     Problem List Patient Active Problem List   Diagnosis Date Noted  . 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 Sinus bradycardia 04/09/2019  . First degree AV block 04/09/2019  .  Postural dizziness with presyncope 02/25/2019  . Osteoporosis 08/21/2018  . Osteoarthritis of left hip 05/21/2018  . Bilateral carotid artery stenosis 04/19/2018  . History of syncope 10/17/2017  . MCI (mild cognitive impairment) 08/12/2017  . Lung nodules 07/09/2017  . Paroxysmal atrial fibrillation ([place]) 04/18/2017  . Pleural effusion 10/20/2016  . Hyponatremia 10/20/2016  . Chronic diastolic congestive heart failure ([place]) 06/28/2016  . Primary open-angle glaucoma, bilateral, mild stage 05/08/2016  . Labile blood glucose 04/27/2016  . Bladder prolapse, female, acquired 02/25/2016  . 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  . B12 deficiency 01/11/2016  . Anemia 01/10/2016  . CLL (chronic lymphocytic leukemia) ([place]) 01/10/2016  . 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 12/21/2015  . Bradycardia 12/21/2015  . Lymphocytosis 06/25/2015  . Impaired fasting glucose 06/22/2015  . Carotid atherosclerosis 06/22/2015  . Essential hypertension 12/22/2014  . Hyperlipidemia 12/22/2014  . Hypothyroidism 12/22/2014    Alanson Puls, PT DPT 02/25/2020, 1:30 PM  [place] [facility] MAIN [facility] SERVICES [address] [place], [place], [address] Phone: [phone]   Fax:  [phone]  Name: CAMARIE MCTIGUE MRN: [id number] Date of Birth: Jul 29, 1924

## 2020-03-03 ENCOUNTER — Ambulatory Visit: Payer: Medicare Other | Admitting: Physical Therapy

## 2020-03-04 ENCOUNTER — Ambulatory Visit: Payer: Medicare Other | Admitting: Hematology and Oncology

## 2020-03-04 ENCOUNTER — Other Ambulatory Visit: Payer: Medicare Other

## 2020-03-05 ENCOUNTER — Other Ambulatory Visit: Payer: Self-pay | Admitting: Hematology and Oncology

## 2020-03-05 DIAGNOSIS — D649 Anemia, unspecified: Secondary | ICD-10-CM

## 2020-03-05 DIAGNOSIS — C9110 Chronic lymphocytic leukemia of B-cell type not having achieved remission: Secondary | ICD-10-CM

## 2020-03-07 NOTE — Progress Notes (Signed)
[facility]  [address] [place], [place] [address] Phone: [phone]  Fax: [phone]   Clinic Day:  03/08/2020  Referring physician: Hubbard Hartshorn, FNP  Chief Complaint: Sydney Mcdaniel is a 86 y.o. female with  chronic lymphocytic leukemia, B12 deficiency, and a normocytic anemia who is seen for 3 month assessment.  HPI: The patient was last seen in the hematology clinic on 12/04/2019 for a telemedicine visit. At that time, she felt "ok". She had no energy. She had poor balance. She denied any falls. She was eating well.  She had dark stool on oral iron.  Hematocrit was 30.2, hemoglobin 10.0, MCV 92.9, platelets 179,000, WBC 9,000. Sodium was 130.  Creatinine was 0.87 (CrCl 58 ml/min). Folate was 31.0. Haptoglobin was 120. LDH was 117. Hepatitis B surface antigen, hepatitis B core antibody, and hepatitis C antibody were non reactive.  Retic was 1.4%.  We discussed active surveillance.  Peripheral smear on 12/04/2019 revealed relative lymphocytosis, comprised of small lymphocytes. There were no increases in larger prolymphocytes. There was normocytic anemia. There was normal platelet count and morphology.   The patient received Vitamin B12 injections monthly x 3 (12/11/2019 - 02/09/2020).   CBC on 01/12/2020 revealed a hematocrit of 29.8, hemoglobin 9.8, MCV 94.3, platelets 176,000, WBC 8,200.  During the interim, she has been "here." She reports fatigue/low energy. She is seeing PT because she has been having balance problems.  She denies runny nose, bleeding of any kind, lumps or bumps, and infections. She eats well. The patient has been taking oral iron BID for "quite a while." She has not had a colonoscopy in the past 10 years.    Past Medical History:  Diagnosis Date  . Allergy   . Anemia   . Cataract   . CLL (chronic lymphocytic leukemia) ([place])   . Glaucoma   . H/O: hysterectomy    Total  . Hyperlipidemia   . Hypertension   .  Hypothyroidism   . Impaired fasting glucose   . Lichen sclerosus   . Osteoporosis    Hips  . PAF (paroxysmal atrial fibrillation) ([place])    a. 06/15/2016 Event monitor: 4% afib burden; b. CHA2DS2VASc - 4-->Xarelto.  . Syncope    a. 03/2016 Echo: EF55-60%, no rwma, mild AI/MR, nl PASP; b. 03/2016 48h Holter: no significant arrhythmias/pauses; c. 03/2016 MV: mild apical defect, likely breast attenuation, nl EF, low risk; d. 05/2016 Event monitor: No significant arrhythmia; e. 05/2016 Event monitor: PAF (4%).    Past Surgical History:  Procedure Laterality Date  . APPENDECTOMY    . EYE SURGERY     Glaucoma  . TOTAL ABDOMINAL HYSTERECTOMY      Family History  Problem Relation Age of Onset  . Heart attack Mother   . Glaucoma Mother   . Heart attack Father   . Parkinson's disease Brother   . Heart attack Brother     Social History:  reports that she has never smoked. She has never used smokeless tobacco. She reports that she does not drink alcohol and does not use drugs.  Her husband died in 07-Jan-2015. She notes that she played basketball 75 years ago on a half court. She lives in [place].Her daughterisKaren. Her daughter can be reached at ([phone]). The patient is accompanied by her dauhgter today.  Allergies:  Allergies  Allergen Reactions  . Other   . Alphagan [Brimonidine] Itching and Rash  . Pravachol [Pravastatin Sodium] Itching and Rash  .  Pravastatin Itching, Other (See Comments) and Rash    Current Medications: Current Outpatient Medications  Medication Sig Dispense Refill  . ACCU-CHEK SOFTCLIX LANCETS lancets Use as instructed; check once a day; H40.1131 100 each 12  . amLODipine (NORVASC) 5 MG tablet Take 1 tablet (5 mg total) by mouth daily. 90 tablet 1  . atorvastatin (LIPITOR) 20 MG tablet Take 1 tablet (20 mg total) by mouth daily. 90 tablet 1  . Calcium Carb-Cholecalciferol (CALCIUM 600 + D PO) Take 1 tablet by mouth daily.     . cholecalciferol  (VITAMIN D) 1000 units tablet Take 1,000 Units by mouth daily.    . COMBIGAN 0.2-0.5 % ophthalmic solution     . Cyanocobalamin (B-12) 1000 MCG/ML KIT Inject 1,000 mcg as directed every 30 (thirty) days.    . EUTHYROX 75 MCG tablet TAKE ONE TABLET BY MOUTH ON MONDAYS, WEDNESDAYS, FRIDAYS, AND SUNDAYS 52 tablet 0  . EUTHYROX 88 MCG tablet TAKE 1 TABLET BY MOUTH ON TUESDAYS, THURSDAYS, AND SATURDAYS 39 tablet 0  . glucose blood (ACCU-CHEK AVIVA PLUS) test strip Use as instructed, check once a day; H40.1131 100 each 3  . IRON PO Take 1 tablet by mouth 2 (two) times daily.     Marland Kitchen latanoprost (XALATAN) 0.005 % ophthalmic solution Place 1 drop into the right eye at bedtime.     Marland Kitchen lisinopril (ZESTRIL) 20 MG tablet Take 1 tablet (20 mg total) by mouth daily. 90 tablet 2  . Multiple Vitamins-Minerals (MULTIVITAMIN ADULT PO) Take by mouth.    . Omega-3 Fatty Acids (FISH OIL) 1200 MG CAPS Take by mouth daily.    . rivaroxaban (XARELTO) 20 MG TABS tablet Take 1 tablet (20 mg total) by mouth daily with supper. 90 tablet 2  . timolol (BETIMOL) 0.5 % ophthalmic solution Place 1 drop into the right eye 2 (two) times daily.    Marland Kitchen alendronate (FOSAMAX) 70 MG tablet TAKE ONE TABLET BY MOUTH EVERY 7 DAYS. TAKE WITH A FULL GLASS OF WATER ON AN EMPTY STOMACH. (Patient not taking: Reported on 03/08/2020) 12 tablet 0  . famotidine (PEPCID) 20 MG tablet Take 1 tablet (20 mg total) by mouth at bedtime. (Patient not taking: Reported on 03/08/2020) 30 tablet 5  . timolol (TIMOPTIC) 0.5 % ophthalmic solution      No current facility-administered medications for this visit.    Review of Systems  Constitutional: Positive for malaise/fatigue (no energy) and weight loss (2 lbs since 11/2019). Negative for chills, diaphoresis and fever.       Feeling "ok".  HENT: Negative for congestion, ear discharge, ear pain, hearing loss, nosebleeds, sinus pain, sore throat and tinnitus.   Eyes: Negative for blurred vision.       Glaucoma.    Respiratory: Negative for cough, hemoptysis, sputum production and shortness of breath.   Cardiovascular: Negative for chest pain, palpitations and leg swelling.       Atrial fibrillation.   Gastrointestinal: Negative for abdominal pain, blood in stool, constipation, diarrhea, heartburn, melena, nausea and vomiting.       Eating well.  Genitourinary: Negative for dysuria, frequency, hematuria and urgency.  Musculoskeletal: Negative for back pain, joint pain (left hip, arthritic; improved), myalgias and neck pain.  Skin: Negative for itching and rash.  Neurological: Negative for dizziness, tingling, sensory change, weakness and headaches.       Balance problems, seeing PT  Endo/Heme/Allergies: Does not bruise/bleed easily.  Psychiatric/Behavioral: Negative for depression and memory loss. The patient is not nervous/anxious  and does not have insomnia.   All other systems reviewed and are negative.  Performance status (ECOG): 1  Vitals Blood pressure (!) 174/48, pulse 64, temperature (!) 96.8 F (36 C), temperature source Tympanic, resp. rate 18, weight 140 lb 1.6 oz (63.6 kg), SpO2 97 %.   Physical Exam Vitals and nursing note reviewed.  Constitutional:      General: She is not in acute distress.    Appearance: She is well-developed. She is not diaphoretic.     Comments: Cane by her side. Required assistance from 2 people onto exam table.  HENT:     Head: Normocephalic and atraumatic.     Comments: Curly gray hair.    Mouth/Throat:     Pharynx: No oropharyngeal exudate.  Eyes:     General: No scleral icterus.    Conjunctiva/sclera: Conjunctivae normal.     Pupils: Pupils are equal, round, and reactive to light.     Comments: Blue eyes.  Cardiovascular:     Rate and Rhythm: Normal rate and regular rhythm.     Heart sounds: Normal heart sounds. No murmur heard.   Pulmonary:     Effort: Pulmonary effort is normal. No respiratory distress.     Breath sounds: Normal breath sounds.  No wheezing or rales.  Chest:     Chest wall: No tenderness.  Abdominal:     General: Bowel sounds are normal. There is no distension.     Palpations: Abdomen is soft. There is no mass.     Tenderness: There is no abdominal tenderness. There is no guarding or rebound.  Musculoskeletal:        General: No tenderness. Normal range of motion.     Cervical back: Normal range of motion and neck supple.     Right lower leg: Edema (chronic) present.     Left lower leg: Edema (chronic) present.  Lymphadenopathy:     Head:     Right side of head: No preauricular, posterior auricular or occipital adenopathy.     Left side of head: No preauricular, posterior auricular or occipital adenopathy.     Cervical: No cervical adenopathy.     Upper Body:     Right upper body: No supraclavicular adenopathy.     Left upper body: No supraclavicular adenopathy.     Lower Body: No right inguinal adenopathy. No left inguinal adenopathy.  Skin:    General: Skin is warm and dry.     Comments: Dark mole on right side of neck.  Neurological:     Mental Status: She is alert and oriented to person, place, and time.  Psychiatric:        Behavior: Behavior normal.        Thought Content: Thought content normal.        Judgment: Judgment normal.    Appointment on 03/08/2020  Component Date Value Ref Range Status  . LDH 03/08/2020 127  98 - 192 U/L Final   Performed at [facility], [address], [place], [place] [address]  . Sodium 03/08/2020 130* 135 - 145 mmol/L Final  . Potassium 03/08/2020 5.0  3.5 - 5.1 mmol/L Final  . Chloride 03/08/2020 98  98 - 111 mmol/L Final  . CO2 03/08/2020 26  22 - 32 mmol/L Final  . Glucose, Bld 03/08/2020 95  70 - 99 mg/dL Final   Glucose reference range applies only to samples taken after fasting for at least 8 hours.  . BUN 03/08/2020 15  8 - 23 mg/dL Final  . Creatinine, Ser 03/08/2020 0.85  0.44 - 1.00 mg/dL Final  . Calcium 03/08/2020 8.9  8.9 - 10.3  mg/dL Final  . Total Protein 03/08/2020 7.3  6.5 - 8.1 g/dL Final  . Albumin 03/08/2020 4.1  3.5 - 5.0 g/dL Final  . AST 03/08/2020 22  15 - 41 U/L Final  . ALT 03/08/2020 20  0 - 44 U/L Final  . Alkaline Phosphatase 03/08/2020 22* 38 - 126 U/L Final  . Total Bilirubin 03/08/2020 0.4  0.3 - 1.2 mg/dL Final  . GFR calc non Af Amer 03/08/2020 59* >60 mL/min Final  . GFR calc Af Amer 03/08/2020 >60  >60 mL/min Final  . Anion gap 03/08/2020 6  5 - 15 Final   Performed at [facility], [address], [place], [place] [address]  . WBC 03/08/2020 8.7  4.0 - 10.5 K/uL Final  . RBC 03/08/2020 3.25* 3.87 - 5.11 MIL/uL Final  . Hemoglobin 03/08/2020 10.3* 12.0 - 15.0 g/dL Final  . HCT 03/08/2020 30.2* 36 - 46 % Final  . MCV 03/08/2020 92.9  80.0 - 100.0 fL Final  . MCH 03/08/2020 31.7  26.0 - 34.0 pg Final  . MCHC 03/08/2020 34.1  30.0 - 36.0 g/dL Final  . RDW 03/08/2020 14.2  11.5 - 15.5 % Final  . Platelets 03/08/2020 191  150 - 400 K/uL Final  . nRBC 03/08/2020 0.0  0.0 - 0.2 % Final  . Neutrophils Relative % 03/08/2020 36  % Final  . Neutro Abs 03/08/2020 3.2  1.7 - 7.7 K/uL Final  . Lymphocytes Relative 03/08/2020 53  % Final  . Lymphs Abs 03/08/2020 4.5* 0.7 - 4.0 K/uL Final  . Monocytes Relative 03/08/2020 10  % Final  . Monocytes Absolute 03/08/2020 0.9  0 - 1 K/uL Final  . Eosinophils Relative 03/08/2020 1  % Final  . Eosinophils Absolute 03/08/2020 0.1  0 - 0 K/uL Final  . Basophils Relative 03/08/2020 0  % Final  . Basophils Absolute 03/08/2020 0.0  0 - 0 K/uL Final  . Immature Granulocytes 03/08/2020 0  % Final  . Abs Immature Granulocytes 03/08/2020 0.03  0.00 - 0.07 K/uL Final   Performed at [facility], [address], [place], [place] [address]    Assessment:  Sydney Mcdaniel is a 86 y.o. female with stage 0 CLL. She presented with a distant history of anemia and recent lymphocytosis. She was noted to have mild lymphocystosis in 06/22/2015.  Absolute lymphocyte count (ALC) has ranged between 4700 - 5500.  Work-up on 07/17/2017revealed a hematocrit of 31.6, hemoglobin 11.4, MCV 89, platelets 203,000, WBC 11,00 with an ANC of 3700. Absolute lymphocyte count was 6400. Ferritin was 116. B12was 119 (low). Folate was 44.0. Retic was 0.9% (low). Uric acid was 4.5. Guaiac cards were negative x 3 in 12/2015.  She was diagnosed with B12 deficiency. She receives B12 monthly (last 02/09/2020). Folatewas 28.2 on 07/18/2019.  Peripheral blood flow cytometryrevealed involvement by a CD5+, CD23+, CD38- monoclonal B cell population with lambda light chain restriction consistent with CLL/SLL. There was a small population (8% of the T cells) of double positive (CD4+/CD8+) T cells (described in association with chronic viral infections, autoimmune disorders, chronic inflammatory disorders, and immunodeficiency states).   She has a progressive normocytic anemia. Work-up on 02/12/2018revealed the following normal labs: creatinine, LDH, uric acid, ferritin (105), iron studies (12% sat; TIBC 295), and folate. Retic was 1.1% (low for  level of anemia). Coombs was negative on 09/04/2016. Dietis modest. She denies any melena or hematochezia. Guaiac cardswere negative x 3 in 07/2016. Last colonoscopywas >10 years ago.  Work-up on 12/04/2019 revealed a hematocrit of 30.2, hemoglobin 10.0, MCV 92.9, platelets 179,000, WBC 9,000. Creatinine was 0.87 (CrCl 58 ml/min). Normal studies included: folate (31.0), haptoglobin, LDH, hepatitis B surface antigen, hepatitis B core antibody, and hepatitis C antibody.  Retic was 1.4% (low).  Peripheral smear on 12/04/2019 revealed relative lymphocytosis, comprised of small lymphocytes. There were no increases in larger prolymphocytes. There was normocytic anemia. There was normal platelet count and morphology.    Ferritinhas been followed: 116 on 01/10/2016, 105 on 08/07/2016, 29 on 11/27/2016, 31 on  03/06/2017, 59 on 07/09/2017, 64 on 11/08/2017, 98 on 05/13/2018, 74 on 07/18/2019, 73 on 11/26/2019, and 78 on 03/08/2020.  She was diagnosed with atrial fibrillationafter presenting withsyncopal episodes. She is on Xarelto.  Chest CTon 11/07/2016 revealed two 2 mm right lung nodules (right major fissure and lateral aspect RUL). There was a 5 mm right lower lobe ground-glass nodule. Chest CTon 11/05/2017 revealed 6 mm ground-glass right lower lobe nodule, stable since 11/07/2016.  There were millimetric pulmonary nodules unchanged and considered benign. Chest CT on 11/07/2019 revealed small scattered lung nodules (up to 5 mm)- stable.  Etiology was likely benign and no further follow-up was recommended.  Symptomatically,  she reports fatigue/low energy. She describes balance problems.  Exam is stable.  She has not had a colonoscopy in the past 10 years.  Plan: 1.   Labs today:  CBC with diff, CMP, retic, DAT, haptoglobin, ferritin, iron studies. 2. CLL Hematocrit 30.2.  Hemoglobin 10.3.  MCV 92.9.  Platelets 191,000.  WBC 8700 ([place] 3200; ALC 4500)   Clinically, she is doing well.  She denies any B symptoms Exam reveals no adenopathy or hepatosplenomegaly. Continue ongoing surveillance. 3.   Mild normocytic anemia  Hematocrit 30.1.  Hemoglobin 10.1.  MCV 92.3 on 10/13/2019.  Hematocrit 32.4.  Hemoglobin 10.8.  MCV 91.5 on 11/26/2019.  Hematocrit 38.2.  Hemoglobin 10.3.  MCV 92.9 on 03/08/2020.  Ferritin 78 with an iron saturation of 24% and a TIBC of 293.  Patient remains on monthly B12.  DAT+ for IgG c/w a warm autoimmune antibody.   Retic count and LDH normal not consistent with hemolysis.     Haptoglobin and bilirubin are normal.  Anemia in CLL can be the result of marrow infiltration, splenomegaly, and hemolytic anemia.  Review plan to check bilirubin, LDH, haptoglobin, peripheral smear (spherocytes).   If clear documentation of hemolysis  found, plan steroids and/or Rituxan.  Today's labs do not suggest active hemolysis requiring treatment. 4. B12 deficiency  B12 today and monthly x 6 for B12 (last 068/16/2021. Folate 28.2 on 07/18/2019              Check folate annually. 5.Iron deficiency Hemoglobin 10.3. MCV 92.9. Ferritin 78 with an iron saturation of 24% and a TIBC of 293.               Continue oral iron.  Preauth Venofer if needed.   Information provided.  She has not had a colonoscopy in the past 10 years. 6.   Tiny pulmonary nodules Chest CT on 11/05/2017 revealed a 6 mm RLL nodule.  Chest CT on 11/07/2019 revealed small scattered lung nodules (up to 5 mm)- stable.             No current plans for follow-up CT. 7. Hyponatremia  Sodium 130. Etiology remains unclear.  Encourage follow-up with PCP.  8.   MD or RN to call patient with iron studies. 9.   RTC in 3 months for MD assessment, labs (CBC with diff, CMP, ferritin, iron studies, retic).  I discussed the assessment and treatment plan with the patient.  The patient was provided an opportunity to ask questions and all were answered.  The patient agreed with the plan and demonstrated an understanding of the instructions.  The patient was advised to call back if the symptoms worsen or if the condition fails to improve as anticipated.  I provided 15 minutes of face-to-face time during this this encounter and > 50% was spent counseling as documented under my assessment and plan.   Lequita Asal, MD, PhD    03/08/2020, 2:25 PM  I, Mirian Mo Tufford, am acting as Education administrator for [employer]. Mike Gip, MD, PhD.  I, Melissa C. Mike Gip, MD, have reviewed the above documentation for accuracy and completeness, and I agree with the above.

## 2020-03-08 ENCOUNTER — Inpatient Hospital Stay (HOSPITAL_BASED_OUTPATIENT_CLINIC_OR_DEPARTMENT_OTHER): Payer: Medicare Other | Admitting: Hematology and Oncology

## 2020-03-08 ENCOUNTER — Other Ambulatory Visit: Payer: Self-pay | Admitting: Hematology and Oncology

## 2020-03-08 ENCOUNTER — Ambulatory Visit: Payer: Medicare Other

## 2020-03-08 ENCOUNTER — Other Ambulatory Visit: Payer: Self-pay

## 2020-03-08 ENCOUNTER — Encounter: Payer: Self-pay | Admitting: Hematology and Oncology

## 2020-03-08 ENCOUNTER — Inpatient Hospital Stay: Payer: Medicare Other | Attending: Hematology and Oncology

## 2020-03-08 VITALS — BP 174/48 | HR 64 | Temp 96.8°F | Resp 18 | Wt 140.1 lb

## 2020-03-08 DIAGNOSIS — D649 Anemia, unspecified: Secondary | ICD-10-CM | POA: Diagnosis not present

## 2020-03-08 DIAGNOSIS — R918 Other nonspecific abnormal finding of lung field: Secondary | ICD-10-CM | POA: Diagnosis not present

## 2020-03-08 DIAGNOSIS — Z7189 Other specified counseling: Secondary | ICD-10-CM | POA: Diagnosis not present

## 2020-03-08 DIAGNOSIS — C9110 Chronic lymphocytic leukemia of B-cell type not having achieved remission: Secondary | ICD-10-CM

## 2020-03-08 DIAGNOSIS — I480 Paroxysmal atrial fibrillation: Secondary | ICD-10-CM | POA: Diagnosis not present

## 2020-03-08 DIAGNOSIS — E871 Hypo-osmolality and hyponatremia: Secondary | ICD-10-CM

## 2020-03-08 DIAGNOSIS — E538 Deficiency of other specified B group vitamins: Secondary | ICD-10-CM | POA: Diagnosis not present

## 2020-03-08 DIAGNOSIS — E039 Hypothyroidism, unspecified: Secondary | ICD-10-CM | POA: Insufficient documentation

## 2020-03-08 DIAGNOSIS — D509 Iron deficiency anemia, unspecified: Secondary | ICD-10-CM

## 2020-03-08 LAB — COMPREHENSIVE METABOLIC PANEL
ALT: 20 U/L (ref 0–44)
AST: 22 U/L (ref 15–41)
Albumin: 4.1 g/dL (ref 3.5–5.0)
Alkaline Phosphatase: 22 U/L — ABNORMAL LOW (ref 38–126)
Anion gap: 6 (ref 5–15)
BUN: 15 mg/dL (ref 8–23)
CO2: 26 mmol/L (ref 22–32)
Calcium: 8.9 mg/dL (ref 8.9–10.3)
Chloride: 98 mmol/L (ref 98–111)
Creatinine, Ser: 0.85 mg/dL (ref 0.44–1.00)
GFR calc Af Amer: >60 mL/min (ref >60)
GFR calc non Af Amer: 59 mL/min — ABNORMAL LOW (ref >60)
Glucose, Bld: 95 mg/dL (ref 70–99)
Potassium: 5 mmol/L (ref 3.5–5.1)
Sodium: 130 mmol/L — ABNORMAL LOW (ref 135–145)
Total Bilirubin: 0.4 mg/dL (ref 0.3–1.2)
Total Protein: 7.3 g/dL (ref 6.5–8.1)

## 2020-03-08 LAB — RETICULOCYTES
Immature Retic Fract: 7.8 % (ref 2.3–15.9)
RBC.: 3.18 MIL/uL — ABNORMAL LOW (ref 3.87–5.11)
Retic Count, Absolute: 56 10*3/uL (ref 19.0–186.0)
Retic Ct Pct: 1.8 % (ref 0.4–3.1)

## 2020-03-08 LAB — CBC WITH DIFFERENTIAL/PLATELET
Abs Immature Granulocytes: 0.03 10*3/uL (ref 0.00–0.07)
Basophils Absolute: 0 10*3/uL (ref 0.0–0.1)
Basophils Relative: 0 %
Eosinophils Absolute: 0.1 10*3/uL (ref 0.0–0.5)
Eosinophils Relative: 1 %
HCT: 30.2 % — ABNORMAL LOW (ref 36.0–46.0)
Hemoglobin: 10.3 g/dL — ABNORMAL LOW (ref 12.0–15.0)
Immature Granulocytes: 0 %
Lymphocytes Relative: 53 %
Lymphs Abs: 4.5 10*3/uL — ABNORMAL HIGH (ref 0.7–4.0)
MCH: 31.7 pg (ref 26.0–34.0)
MCHC: 34.1 g/dL (ref 30.0–36.0)
MCV: 92.9 fL (ref 80.0–100.0)
Monocytes Absolute: 0.9 10*3/uL (ref 0.1–1.0)
Monocytes Relative: 10 %
Neutro Abs: 3.2 10*3/uL (ref 1.7–7.7)
Neutrophils Relative %: 36 %
Platelets: 191 10*3/uL (ref 150–400)
RBC: 3.25 MIL/uL — ABNORMAL LOW (ref 3.87–5.11)
RDW: 14.2 % (ref 11.5–15.5)
WBC: 8.7 10*3/uL (ref 4.0–10.5)
nRBC: 0 % (ref 0.0–0.2)

## 2020-03-08 LAB — IRON AND TIBC
Iron: 69 ug/dL (ref 28–170)
Saturation Ratios: 24 % (ref 10.4–31.8)
TIBC: 293 ug/dL (ref 250–450)
UIBC: 224 ug/dL

## 2020-03-08 LAB — LACTATE DEHYDROGENASE: LDH: 127 U/L (ref 98–192)

## 2020-03-08 LAB — FERRITIN: Ferritin: 78 ng/mL (ref 11–307)

## 2020-03-08 LAB — DAT, POLYSPECIFIC AHG (ARMC ONLY)
DAT, IgG: POSITIVE
DAT, complement: NEGATIVE
Polyspecific AHG test: POSITIVE

## 2020-03-08 MED ORDER — CYANOCOBALAMIN 1000 MCG/ML IJ SOLN
INTRAMUSCULAR | Status: AC
  Filled 2020-03-08: qty 1

## 2020-03-08 MED ORDER — CYANOCOBALAMIN 1000 MCG/ML IJ SOLN
1000.0000 ug | Freq: Once | INTRAMUSCULAR | Status: AC
  Administered 2020-03-08: 1000 ug via INTRAMUSCULAR

## 2020-03-08 NOTE — Progress Notes (Signed)
Patient here for oncology follow-up appointment, expresses no complaints or concerns at this time.    

## 2020-03-08 NOTE — Patient Instructions (Signed)

## 2020-03-09 ENCOUNTER — Other Ambulatory Visit: Payer: Self-pay

## 2020-03-09 DIAGNOSIS — M810 Age-related osteoporosis without current pathological fracture: Secondary | ICD-10-CM

## 2020-03-09 LAB — HAPTOGLOBIN: Haptoglobin: 111 mg/dL (ref 41–333)

## 2020-03-10 MED ORDER — ALENDRONATE SODIUM 70 MG PO TABS: ORAL_TABLET | ORAL | 0 refills | Status: DC

## 2020-03-11 ENCOUNTER — Telehealth: Payer: Self-pay

## 2020-03-11 ENCOUNTER — Ambulatory Visit: Payer: Medicare Other | Admitting: Physical Therapy

## 2020-03-11 ENCOUNTER — Other Ambulatory Visit: Payer: Self-pay

## 2020-03-11 ENCOUNTER — Encounter: Payer: Self-pay | Admitting: Physical Therapy

## 2020-03-11 ENCOUNTER — Encounter: Payer: Self-pay | Admitting: Hematology and Oncology

## 2020-03-11 DIAGNOSIS — R262 Difficulty in walking, not elsewhere classified: Secondary | ICD-10-CM | POA: Diagnosis not present

## 2020-03-11 DIAGNOSIS — M6281 Muscle weakness (generalized): Secondary | ICD-10-CM

## 2020-03-11 DIAGNOSIS — Z23 Encounter for immunization: Secondary | ICD-10-CM | POA: Diagnosis not present

## 2020-03-11 NOTE — Therapy (Addendum)
[place] MAIN [facility] SERVICES [address] [place], [place], [address] Phone: [phone]   Fax:  [phone]  Physical Therapy Treatment  Patient Details  Name: Sydney Mcdaniel MRN: [id number] Date of Birth: 1924/08/30 Referring Provider (PT): Delsa Grana   Encounter Date: 03/11/2020   PT End of Session - 03/11/20 1107    Visit Number 4    Number of Visits 17    Date for PT Re-Evaluation 04/14/20    PT Start Time 1100    PT Stop Time 1140    PT Time Calculation (min) 40 min    Equipment Utilized During Treatment Gait belt    Activity Tolerance Patient tolerated treatment well    Behavior During Therapy WFL for tasks assessed/performed           Past Medical History:  Diagnosis Date  . Allergy   . Anemia   . Cataract   . CLL (chronic lymphocytic leukemia) ([place])   . Glaucoma   . H/O: hysterectomy    Total  . Hyperlipidemia   . Hypertension   . Hypothyroidism   . Impaired fasting glucose   . Lichen sclerosus   . Osteoporosis    Hips  . PAF (paroxysmal atrial fibrillation) ([place])    a. 06/15/2016 Event monitor: 4% afib burden; b. CHA2DS2VASc - 4-->Xarelto.  . Syncope    a. 03/2016 Echo: EF55-60%, no rwma, mild AI/MR, nl PASP; b. 03/2016 48h Holter: no significant arrhythmias/pauses; c. 03/2016 MV: mild apical defect, likely breast attenuation, nl EF, low risk; d. 05/2016 Event monitor: No significant arrhythmia; e. 05/2016 Event monitor: PAF (4%).    Past Surgical History:  Procedure Laterality Date  . APPENDECTOMY    . EYE SURGERY     Glaucoma  . TOTAL ABDOMINAL HYSTERECTOMY      There were no vitals filed for this visit.   Subjective Assessment - 03/11/20 1106    Subjective Patient would like to have better balance.    Pertinent History Patient had HHPT 10 months ago. Before that she was in the hospital with BP issues over night. She was walking with spc outdoors, inside she holds to the walls.    Limitations  Walking;Standing    How long can you stand comfortably? 15 minutes    How long can you walk comfortably? 5 mins    Patient Stated Goals to have better balance and walk better    Currently in Pain? No/denies    Pain Score 0-No pain           Treatment: Neuromuscular training: Static standing with one foot on the 6 inch stool and one on the floor x 30 sec x 10 side stepping left and right n flat surface 10 feet x 3 standing on blue foam with head turns x 1 min  Standing feet together on floor with head turns x 1 min step ups from floor to 6 inch stool x 20 bilateral Step ups from blue foam to 6 inch stool x 20 BLE sit to stand x 10 marching in parallel bars x 20 stepping pattern with weight shifting fwd/bwd x 10.  Four square x10 reps fwd/bwd, side stepping x 10 Leg press 25 lbs x 20 x 2, heel raises x 20 x 2 with 25 lbs  Min to mod assist and verbal cues used throughout with increased in postural sway and LOB most seen with narrow base of support and while on uneven surfaces. Continues to have balance deficits  typical with diagnosis. Patient performs beginning level exercises without pain behaviors and needs verbal cuing for postural alignment and head positioning                          PT Education - 03/11/20 1107    Education Details HEP    Person(s) Educated Patient    Methods Explanation    Comprehension Verbalized understanding            PT Short Term Goals - 02/19/20 1434      PT SHORT TERM GOAL #1   Title Patient (> 82 years old) will complete five times sit to stand test in < 15 seconds indicating an increased LE strength and improved balance.    Time 4    Period Weeks    Status New             PT Long Term Goals - 02/19/20 1431      PT LONG TERM GOAL #1   Title Patient will increase BLE gross strength to 4+/5 as to improve functional strength for independent gait, increased standing tolerance and increased ADL ability.    Time 8     Period Weeks    Status New    Target Date 04/14/20      PT LONG TERM GOAL #2   Title Patient will increase Berg Balance score by > 6 points to demonstrate decreased fall risk during functional activities.    Time 8    Period Weeks    Status New    Target Date 04/14/20      PT LONG TERM GOAL #3   Title Patient will reduce timed up and go to <11 seconds to reduce fall risk and demonstrate improved transfer/gait ability.    Time 8    Status New    Target Date 04/14/20      PT LONG TERM GOAL #4   Title Patient will increase 10 meter walk test to >1.34m/s as to improve gait speed for better community ambulation and to reduce fall risk.    Time 8    Period Weeks    Status New    Target Date 04/14/20                 Plan - 03/11/20 1108    Clinical Impression Statement Patient instructed in beginning balance and coordination exercise. Patient required mod VCs and min A for gait to improve weight shift and postural control. Patient requires min VCs to improve ankle stability with weight shifting.  Patients would benefit from additional skilled PT intervention to improve balance/gait safety and reduce fall risk.    Personal Factors and Comorbidities Age;Comorbidity 1;Comorbidity 2    Comorbidities HTN, osteoporosis, visual impairment, back pain, incontenence, Cancer    Examination-Activity Limitations Locomotion Level;Stairs    Stability/Clinical Decision Making Stable/Uncomplicated    PT Frequency 2x / week    PT Duration 8 weeks    PT Treatment/Interventions Neuromuscular re-education;Therapeutic exercise;Therapeutic activities;Balance training;Functional mobility training;Gait training           Patient will benefit from skilled therapeutic intervention in order to improve the following deficits and impairments:  Abnormal gait, Decreased activity tolerance, Decreased endurance, Decreased knowledge of use of DME, Impaired flexibility, Difficulty walking, Decreased balance,  Pain  Visit Diagnosis: Difficulty in walking, not elsewhere classified  Muscle weakness (generalized)     Problem List Patient Active Problem List   Diagnosis Date Noted  . Iron  deficiency anemia 03/08/2020  . Goals of care, counseling/discussion 03/08/2020  . 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 Sinus bradycardia 04/09/2019  . First degree AV block 04/09/2019  . Postural dizziness with presyncope 02/25/2019  . Osteoporosis 08/21/2018  . Osteoarthritis of left hip 05/21/2018  . Bilateral carotid artery stenosis 04/19/2018  . History of syncope 10/17/2017  . MCI (mild cognitive impairment) 08/12/2017  . Lung nodules 07/09/2017  . Paroxysmal atrial fibrillation ([place]) 04/18/2017  . Pleural effusion 10/20/2016  . Hyponatremia 10/20/2016  . Chronic diastolic congestive heart failure ([place]) 06/28/2016  . Primary open-angle glaucoma, bilateral, mild stage 05/08/2016  . Labile blood glucose 04/27/2016  . Bladder prolapse, female, acquired 02/25/2016  . 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  . B12 deficiency 01/11/2016  . Anemia 01/10/2016  . CLL (chronic lymphocytic leukemia) ([place]) 01/10/2016  . 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 12/21/2015  . Bradycardia 12/21/2015  . Lymphocytosis 06/25/2015  . Impaired fasting glucose 06/22/2015  . Carotid atherosclerosis 06/22/2015  . Essential hypertension 12/22/2014  . Hyperlipidemia 12/22/2014  . Hypothyroidism 12/22/2014    Alanson Puls, PT DPT 03/11/2020, 11:09 AM  [place] MAIN [facility] SERVICES [address] [place], [place], [address] Phone: [phone]   Fax:  [phone]  Name: Sydney Mcdaniel MRN: [id number] Date of Birth: 10/06/1924

## 2020-03-11 NOTE — Telephone Encounter (Signed)
Spoke with the patient daughter Sydney Mcdaniel to inform her, Per Dr Mike Gip she will not need IV Iron Infusion at this time keep schedule appointment. Ms Sydney Mcdaniel was understanding and agreeable.

## 2020-03-15 ENCOUNTER — Ambulatory Visit: Payer: Medicare Other | Admitting: Physical Therapy

## 2020-03-15 ENCOUNTER — Encounter: Payer: Self-pay | Admitting: Physical Therapy

## 2020-03-15 ENCOUNTER — Other Ambulatory Visit: Payer: Self-pay

## 2020-03-15 DIAGNOSIS — R262 Difficulty in walking, not elsewhere classified: Secondary | ICD-10-CM

## 2020-03-15 DIAGNOSIS — M6281 Muscle weakness (generalized): Secondary | ICD-10-CM

## 2020-03-15 NOTE — Therapy (Signed)
[place] MAIN [facility] SERVICES [address] [place], [place], [address] Phone: [phone]   Fax:  [phone]  Physical Therapy Treatment  Patient Details  Name: Sydney Mcdaniel MRN: [id number] Date of Birth: 08/30/1924 Referring Provider (PT): Delsa Grana   Encounter Date: 03/15/2020   PT End of Session - 03/15/20 1034    Visit Number 5    Number of Visits 17    Date for PT Re-Evaluation 04/14/20    PT Start Time 9509    PT Stop Time 1100    PT Time Calculation (min) 45 min    Equipment Utilized During Treatment Gait belt    Activity Tolerance Patient tolerated treatment well    Behavior During Therapy [facility] for tasks assessed/performed           Past Medical History:  Diagnosis Date  . Allergy   . Anemia   . Cataract   . CLL (chronic lymphocytic leukemia) ([place])   . Glaucoma   . H/O: hysterectomy    Total  . Hyperlipidemia   . Hypertension   . Hypothyroidism   . Impaired fasting glucose   . Lichen sclerosus   . Osteoporosis    Hips  . PAF (paroxysmal atrial fibrillation) ([place])    a. 06/15/2016 Event monitor: 4% afib burden; b. CHA2DS2VASc - 4-->Xarelto.  . Syncope    a. 03/2016 Echo: EF55-60%, no rwma, mild AI/MR, nl PASP; b. 03/2016 48h Holter: no significant arrhythmias/pauses; c. 03/2016 MV: mild apical defect, likely breast attenuation, nl EF, low risk; d. 05/2016 Event monitor: No significant arrhythmia; e. 05/2016 Event monitor: PAF (4%).    Past Surgical History:  Procedure Laterality Date  . APPENDECTOMY    . EYE SURGERY     Glaucoma  . TOTAL ABDOMINAL HYSTERECTOMY      There were no vitals filed for this visit.   Subjective Assessment - 03/15/20 1034    Subjective Patient would like to have better balance.    Pertinent History Patient had HHPT 10 months ago. Before that she was in the hospital with BP issues over night. She was walking with spc outdoors, inside she holds to the walls.    Limitations  Walking;Standing    How long can you stand comfortably? 15 minutes    How long can you walk comfortably? 5 mins    Patient Stated Goals to have better balance and walk better    Currently in Pain? No/denies    Pain Score 0-No pain              Neuromuscular Re-education  Shifting over toes, then heels x 20 Stepping over hurdle fwd/bwd, side to side x 20 each direction Tandem gait on floor without UE support x 2 lengths Side stepping on floor  without UE support x 2 lengths Heel/toe raises without UE support 3s hold x 10 each 1/2 foam roll balance with flat side up 30s x 2 reps 1/2 foam roll balance with flat side down 30s x 2 reps Lateral side steps from foam to 6 inch stool left and right x 15 Backwards stepping from foam to 6 inch stool x 15  Four Square fwd/bwd, side to side , diagonal x 10 ,cues for posture and stepping strategies, occasional LOB Shifting left to right x 10        Pt educated throughout session about proper posture and technique with exercises. Improved exercise technique, movement at target joints, use of target muscles after min to  mod verbal, visual, tactile cues.                       PT Education - 03/15/20 1034    Education Details HEP    Person(s) Educated Patient    Methods Explanation    Comprehension Verbalized understanding;Need further instruction            PT Short Term Goals - 02/19/20 1434      PT SHORT TERM GOAL #1   Title Patient (86 years old) will complete five times sit to stand test in < 15 seconds indicating an increased LE strength and improved balance.    Time 4    Period Weeks    Status New             PT Long Term Goals - 02/19/20 1431      PT LONG TERM GOAL #1   Title Patient will increase BLE gross strength to 4+/5 as to improve functional strength for independent gait, increased standing tolerance and increased ADL ability.    Time 8    Period Weeks    Status New    Target Date  04/14/20      PT LONG TERM GOAL #2   Title Patient will increase Berg Balance score by > 6 points to demonstrate decreased fall risk during functional activities.    Time 8    Period Weeks    Status New    Target Date 04/14/20      PT LONG TERM GOAL #3   Title Patient will reduce timed up and go to <11 seconds to reduce fall risk and demonstrate improved transfer/gait ability.    Time 8    Status New    Target Date 04/14/20      PT LONG TERM GOAL #4   Title Patient will increase 10 meter walk test to >1.49m/s as to improve gait speed for better community ambulation and to reduce fall risk.    Time 8    Period Weeks    Status New    Target Date 04/14/20                 Plan - 03/15/20 1035    Clinical Impression Statement Pt demonstrates increased postural sway when standing on uneven surface and requires // bars to steady, and demonstrates fatigue at end of set of exercises focused on strength and endurance.  Patient will continue to benefit from skilled PT for improved balance and strength   Personal Factors and Comorbidities Age;Comorbidity 1;Comorbidity 2    Comorbidities HTN, osteoporosis, visual impairment, back pain, incontenence, Cancer    Examination-Activity Limitations Locomotion Level;Stairs    Stability/Clinical Decision Making Stable/Uncomplicated    PT Frequency 2x / week    PT Duration 8 weeks    PT Treatment/Interventions Neuromuscular re-education;Therapeutic exercise;Therapeutic activities;Balance training;Functional mobility training;Gait training           Patient will benefit from skilled therapeutic intervention in order to improve the following deficits and impairments:  Abnormal gait, Decreased activity tolerance, Decreased endurance, Decreased knowledge of use of DME, Impaired flexibility, Difficulty walking, Decreased balance, Pain  Visit Diagnosis: Difficulty in walking, not elsewhere classified  Muscle weakness  (generalized)     Problem List Patient Active Problem List   Diagnosis Date Noted  . Iron deficiency anemia 03/08/2020  . Goals of care, counseling/discussion 03/08/2020  . 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 Sinus bradycardia 04/09/2019  . First  degree AV block 04/09/2019  . Postural dizziness with presyncope 02/25/2019  . Osteoporosis 08/21/2018  . Osteoarthritis of left hip 05/21/2018  . Bilateral carotid artery stenosis 04/19/2018  . History of syncope 10/17/2017  . MCI (mild cognitive impairment) 08/12/2017  . Lung nodules 07/09/2017  . Paroxysmal atrial fibrillation ([place]) 04/18/2017  . Pleural effusion 10/20/2016  . Hyponatremia 10/20/2016  . Chronic diastolic congestive heart failure ([place]) 06/28/2016  . Primary open-angle glaucoma, bilateral, mild stage 05/08/2016  . Labile blood glucose 04/27/2016  . Bladder prolapse, female, acquired 02/25/2016  . 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  . B12 deficiency 01/11/2016  . Anemia 01/10/2016  . CLL (chronic lymphocytic leukemia) ([place]) 01/10/2016  . 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 12/21/2015  . Bradycardia 12/21/2015  . Lymphocytosis 06/25/2015  . Impaired fasting glucose 06/22/2015  . Carotid atherosclerosis 06/22/2015  . Essential hypertension 12/22/2014  . Hyperlipidemia 12/22/2014  . Hypothyroidism 12/22/2014    Alanson Puls, PT DPT 03/15/2020, 10:36 AM  [place] MAIN [facility] SERVICES [address] [place], [place], [address] Phone: [phone]   Fax:  [phone]  Name: BAMA HANSELMAN MRN: [id number] Date of Birth: [birth year]

## 2020-03-18 ENCOUNTER — Other Ambulatory Visit: Payer: Self-pay

## 2020-03-18 ENCOUNTER — Encounter: Payer: Self-pay | Admitting: Physical Therapy

## 2020-03-18 ENCOUNTER — Ambulatory Visit: Payer: Medicare Other | Admitting: Physical Therapy

## 2020-03-18 DIAGNOSIS — R262 Difficulty in walking, not elsewhere classified: Secondary | ICD-10-CM | POA: Diagnosis not present

## 2020-03-18 DIAGNOSIS — M6281 Muscle weakness (generalized): Secondary | ICD-10-CM

## 2020-03-18 NOTE — Therapy (Signed)
[place] MAIN [facility] SERVICES [address] [place], [place], [address] Phone: [phone]   Fax:  [phone]  Physical Therapy Treatment  Patient Details  Name: Sydney Mcdaniel MRN: [id number] Date of Birth: 03-28-1925 Referring Provider (PT): Delsa Grana   Encounter Date: 03/18/2020   PT End of Session - 03/18/20 1159    Visit Number 6    Number of Visits 17    Date for PT Re-Evaluation 04/14/20    PT Start Time 1100    PT Stop Time 1145    PT Time Calculation (min) 45 min    Equipment Utilized During Treatment Gait belt    Activity Tolerance Patient tolerated treatment well    Behavior During Therapy WFL for tasks assessed/performed           Past Medical History:  Diagnosis Date   Allergy    Anemia    Cataract    CLL (chronic lymphocytic leukemia) ([place])    Glaucoma    H/O: hysterectomy    Total   Hyperlipidemia    Hypertension    Hypothyroidism    Impaired fasting glucose    Lichen sclerosus    Osteoporosis    Hips   PAF (paroxysmal atrial fibrillation) ([place])    a. 06/15/2016 Event monitor: 4% afib burden; b. CHA2DS2VASc - 4-->Xarelto.   Syncope    a. 03/2016 Echo: EF55-60%, no rwma, mild AI/MR, nl PASP; b. 03/2016 48h Holter: no significant arrhythmias/pauses; c. 03/2016 MV: mild apical defect, likely breast attenuation, nl EF, low risk; d. 05/2016 Event monitor: No significant arrhythmia; e. 05/2016 Event monitor: PAF (4%).    Past Surgical History:  Procedure Laterality Date   APPENDECTOMY     EYE SURGERY     Glaucoma   TOTAL ABDOMINAL HYSTERECTOMY      There were no vitals filed for this visit.   Subjective Assessment - 03/18/20 1159    Subjective Patient would like to have better balance.    Pertinent History Patient had HHPT 10 months ago. Before that she was in the hospital with BP issues over night. She was walking with spc outdoors, inside she holds to the walls.    Limitations  Walking;Standing    How long can you stand comfortably? 15 minutes    How long can you walk comfortably? 5 mins    Patient Stated Goals to have better balance and walk better    Currently in Pain? No/denies    Pain Score 0-No pain           Treatment: Neuromuscular Training: Stool: staggered stance, head turns side/side, up/down x 5 reps each, each foot in front; VCs for proper technique and positioning for each exercise staggered stance, trunk rotation with 2 lb rod, VC to keep UE straight and turn head with trunk  1/2 foam flat side up  Lateral weight shift x10 reps each direction, no UE support, CGA for safety with VCs to tap in each direction, controlling the speed Anterior/Posterior weight shift x10 reps, no UE support, CGA for safety with VCs to utilize ankles to shift weight over toes and back to heels  Hurdle: Forward and backward stepping over hurdle x15 each direction, VCs to take big enough steps and to try to increase speed to work on coordination  Side stepping over hurdle 15x each direction  Blue Foam: Side stepping x10 on blue balance CGA for safety, VCs for taking a big enough step  Airex pad trunk rotation x2  min, CGA for safety, demonstrated difficulty with keeping arms extended and full rotation with head turn Airex pad, balloon tapping to mirror x2 min, supervision for safety with varying directions and speed of balloon, VCs for utilizing both hands and minimizing UE support  BOSU: Lunge to BOSU ball x 10 , cues for going slow and to control the speed  Agility ladder Step out, out, in, in forwards x2 laps, backwards x2 laps, CGA for safety, VCs to take big enough steps and to try to increase speed to work on coordination  Side stepping x1 lap each direction, CGA for safety, VCs for taking a big step to allow both feet into the square   Floor Star exercise: Performed stepping on the star diagram on the floor. Working on weight-shifting forward onto the foot  that stepped forward and then weight-shifting back and bringing her feet back together with CGA. Performed 2 reps each foot.       Pt educated throughout session about proper posture and technique with exercises. Improved exercise technique, movement at target joints, use of target muscles after min to mod verbal, visual, tactile cues. CGA and Min to mod verbal cues used throughout with increased in postural sway and LOB most seen with narrow base of support and while on uneven surfaces. Continues to have balance deficits typical with diagnosis.                           PT Education - 03/18/20 1159    Education Details HEP    Person(s) Educated Patient    Methods Explanation    Comprehension Verbalized understanding;Need further instruction;Tactile cues required            PT Short Term Goals - 02/19/20 1434      PT SHORT TERM GOAL #1   Title Patient (86 years old) will complete five times sit to stand test in < 15 seconds indicating an increased LE strength and improved balance.    Time 4    Period Weeks    Status New             PT Long Term Goals - 02/19/20 1431      PT LONG TERM GOAL #1   Title Patient will increase BLE gross strength to 4+/5 as to improve functional strength for independent gait, increased standing tolerance and increased ADL ability.    Time 8    Period Weeks    Status New    Target Date 04/14/20      PT LONG TERM GOAL #2   Title Patient will increase Berg Balance score by > 6 points to demonstrate decreased fall risk during functional activities.    Time 8    Period Weeks    Status New    Target Date 04/14/20      PT LONG TERM GOAL #3   Title Patient will reduce timed up and go to <11 seconds to reduce fall risk and demonstrate improved transfer/gait ability.    Time 8    Status New    Target Date 04/14/20      PT LONG TERM GOAL #4   Title Patient will increase 10 meter walk test to >1.67m/s as to improve gait  speed for better community ambulation and to reduce fall risk.    Time 8    Period Weeks    Status New    Target Date 04/14/20  Plan - 03/18/20 1200    Clinical Impression Statement Pt presents with unsteadiness on uneven surfaces and fatigues with therapeutic exercises.  Patient tolerated all interventions well this date and will benefit from continued skilled PT interventions to improve strength and balance and decrease risk of falling.   Personal Factors and Comorbidities Age;Comorbidity 1;Comorbidity 2    Comorbidities HTN, osteoporosis, visual impairment, back pain, incontenence, Cancer    Examination-Activity Limitations Locomotion Level;Stairs    Stability/Clinical Decision Making Stable/Uncomplicated    PT Frequency 2x / week    PT Duration 8 weeks    PT Treatment/Interventions Neuromuscular re-education;Therapeutic exercise;Therapeutic activities;Balance training;Functional mobility training;Gait training           Patient will benefit from skilled therapeutic intervention in order to improve the following deficits and impairments:  Abnormal gait, Decreased activity tolerance, Decreased endurance, Decreased knowledge of use of DME, Impaired flexibility, Difficulty walking, Decreased balance, Pain  Visit Diagnosis: Difficulty in walking, not elsewhere classified  Muscle weakness (generalized)     Problem List Patient Active Problem List   Diagnosis Date Noted   Iron deficiency anemia 03/08/2020   Goals of care, counseling/discussion 03/08/2020   Positive direct antiglobulin test (DAT) 12/04/2019   Sinus bradycardia 04/09/2019   First degree AV block 04/09/2019   Postural dizziness with presyncope 02/25/2019   Osteoporosis 08/21/2018   Osteoarthritis of left hip 05/21/2018   Bilateral carotid artery stenosis 04/19/2018   History of syncope 10/17/2017   MCI (mild cognitive impairment) 08/12/2017   Lung nodules 07/09/2017    Paroxysmal atrial fibrillation ([facility]) 04/18/2017   Pleural effusion 10/20/2016   Hyponatremia 10/20/2016   Chronic diastolic congestive heart failure ([place]) 06/28/2016   Primary open-angle glaucoma, bilateral, mild stage 05/08/2016   Labile blood glucose 04/27/2016   Bladder prolapse, female, acquired 02/25/2016   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   B12 deficiency 01/11/2016   Anemia 01/10/2016   CLL (chronic lymphocytic leukemia) ([place]) 01/10/2016   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 12/21/2015   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 12/21/2015   Bradycardia 12/21/2015   Lymphocytosis 06/25/2015   Impaired fasting glucose 06/22/2015   Carotid atherosclerosis 06/22/2015   Essential hypertension 12/22/2014   Hyperlipidemia 12/22/2014   Hypothyroidism 12/22/2014    Alanson Puls, PT DPT 03/18/2020, 12:01 PM  [place] [facility] MAIN [facility] SERVICES [place], [place], [address] Phone: [phone]   Fax:  [phone]  Name: RASHEEDAH REIS MRN: [id number] Date of Birth: June 29, 1924

## 2020-03-23 ENCOUNTER — Other Ambulatory Visit: Payer: Self-pay

## 2020-03-23 ENCOUNTER — Encounter: Payer: Self-pay | Admitting: Physical Therapy

## 2020-03-23 ENCOUNTER — Ambulatory Visit: Payer: Medicare Other | Admitting: Physical Therapy

## 2020-03-23 DIAGNOSIS — M6281 Muscle weakness (generalized): Secondary | ICD-10-CM

## 2020-03-23 DIAGNOSIS — R262 Difficulty in walking, not elsewhere classified: Secondary | ICD-10-CM

## 2020-03-23 NOTE — Therapy (Signed)
[place] MAIN [facility] SERVICES [address] [place], [place], [address] Phone: [phone]   Fax:  [phone]  Physical Therapy Treatment  Patient Details  Name: Sydney Mcdaniel MRN: [id number] Date of Birth: 01-22-1925 Referring Provider (PT): Delsa Grana   Encounter Date: 03/23/2020   PT End of Session - 03/23/20 1147    Visit Number 7    Number of Visits 17    Date for PT Re-Evaluation 04/14/20    PT Start Time 7858    PT Stop Time 1225    PT Time Calculation (min) 40 min    Equipment Utilized During Treatment Gait belt    Activity Tolerance Patient tolerated treatment well    Behavior During Therapy [facility] for tasks assessed/performed           Past Medical History:  Diagnosis Date  . Allergy   . Anemia   . Cataract   . CLL (chronic lymphocytic leukemia) ([place])   . Glaucoma   . H/O: hysterectomy    Total  . Hyperlipidemia   . Hypertension   . Hypothyroidism   . Impaired fasting glucose   . Lichen sclerosus   . Osteoporosis    Hips  . PAF (paroxysmal atrial fibrillation) ([place])    a. 06/15/2016 Event monitor: 4% afib burden; b. CHA2DS2VASc - 4-->Xarelto.  . Syncope    a. 03/2016 Echo: EF55-60%, no rwma, mild AI/MR, nl PASP; b. 03/2016 48h Holter: no significant arrhythmias/pauses; c. 03/2016 MV: mild apical defect, likely breast attenuation, nl EF, low risk; d. 05/2016 Event monitor: No significant arrhythmia; e. 05/2016 Event monitor: PAF (4%).    Past Surgical History:  Procedure Laterality Date  . APPENDECTOMY    . EYE SURGERY     Glaucoma  . TOTAL ABDOMINAL HYSTERECTOMY      There were no vitals filed for this visit.   Subjective Assessment - 03/23/20 1147    Subjective Patient would like to have better balance.    Currently in Pain? No/denies    Pain Score 0-No pain          Treatment: Nu-step x 5 mins L 3 Weight shifting over feet in parallel bars x 10  Step fwd, then bwd same side in parallel bars  LLE , then RLE x 10  Step ups from floor to stool x 20 with min assist One LE on foam, one LE on stool and hands together leaning fwd x 20 each side with min asssit  Step ups from blue foam to stool x 20 with min assist  Rocker board fwd/ bwd x 20 reps, change foot position with CGA Patient performed with instruction, verbal cues, tactile cues of therapist: goal: increase tissue extensibility, promote proper posture, improve mobility                          PT Education - 03/23/20 1147    Education Details HEP    Person(s) Educated Patient    Methods Explanation    Comprehension Verbalized understanding            PT Short Term Goals - 02/19/20 1434      PT SHORT TERM GOAL #1   Title Patient (> 18 years old) will complete five times sit to stand test in < 15 seconds indicating an increased LE strength and improved balance.    Time 4    Period Weeks    Status New  PT Long Term Goals - 02/19/20 1431      PT LONG TERM GOAL #1   Title Patient will increase BLE gross strength to 4+/5 as to improve functional strength for independent gait, increased standing tolerance and increased ADL ability.    Time 8    Period Weeks    Status New    Target Date 04/14/20      PT LONG TERM GOAL #2   Title Patient will increase Berg Balance score by > 6 points to demonstrate decreased fall risk during functional activities.    Time 8    Period Weeks    Status New    Target Date 04/14/20      PT LONG TERM GOAL #3   Title Patient will reduce timed up and go to <11 seconds to reduce fall risk and demonstrate improved transfer/gait ability.    Time 8    Status New    Target Date 04/14/20      PT LONG TERM GOAL #4   Title Patient will increase 10 meter walk test to >1.55m/s as to improve gait speed for better community ambulation and to reduce fall risk.    Time 8    Period Weeks    Status New    Target Date 04/14/20                 Plan -  03/23/20 1157    Clinical Impression Statement Patient instructed in beginning balance and coordination exercise. Patient required mod VCs and mod A  to improve weight shift tover her feet and postural control. .  Patients would benefit from additional skilled PT intervention to improve balance/gait safety and reduce fall risk.    Personal Factors and Comorbidities Age;Comorbidity 1;Comorbidity 2    Comorbidities HTN, osteoporosis, visual impairment, back pain, incontenence, Cancer    Examination-Activity Limitations Locomotion Level;Stairs    Stability/Clinical Decision Making Stable/Uncomplicated    PT Frequency 2x / week    PT Duration 8 weeks    PT Treatment/Interventions Neuromuscular re-education;Therapeutic exercise;Therapeutic activities;Balance training;Functional mobility training;Gait training           Patient will benefit from skilled therapeutic intervention in order to improve the following deficits and impairments:  Abnormal gait, Decreased activity tolerance, Decreased endurance, Decreased knowledge of use of DME, Impaired flexibility, Difficulty walking, Decreased balance, Pain  Visit Diagnosis: Muscle weakness (generalized)  Difficulty in walking, not elsewhere classified     Problem List Patient Active Problem List   Diagnosis Date Noted  . Iron deficiency anemia 03/08/2020  . Goals of care, counseling/discussion 03/08/2020  . 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 Sinus bradycardia 04/09/2019  . First degree AV block 04/09/2019  . Postural dizziness with presyncope 02/25/2019  . Osteoporosis 08/21/2018  . Osteoarthritis of left hip 05/21/2018  . Bilateral carotid artery stenosis 04/19/2018  . History of syncope 10/17/2017  . MCI (mild cognitive impairment) 08/12/2017  . Lung nodules 07/09/2017  . Paroxysmal atrial fibrillation ([place]) 04/18/2017  . Pleural effusion 10/20/2016  . Hyponatremia 10/20/2016  . Chronic diastolic congestive heart  failure ([place]) 06/28/2016  . Primary open-angle glaucoma, bilateral, mild stage 05/08/2016  . Labile blood glucose 04/27/2016  . Bladder prolapse, female, acquired 02/25/2016  . 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  . B12 deficiency 01/11/2016  . Anemia 01/10/2016  . CLL (chronic lymphocytic leukemia) ([place]) 01/10/2016  . 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 12/21/2015  .  Bradycardia 12/21/2015  . Lymphocytosis 06/25/2015  . Impaired fasting glucose 06/22/2015  . Carotid atherosclerosis 06/22/2015  . Essential hypertension 12/22/2014  . Hyperlipidemia 12/22/2014  . Hypothyroidism 12/22/2014    Alanson Puls, PT DPT 03/23/2020, 11:58 AM  [place] MAIN [facility] SERVICES [address] [place], [place], [address] Phone: [phone]   Fax:  [phone]  Name: Sydney Mcdaniel MRN: [id number] Date of Birth: August 19, 1924

## 2020-03-25 ENCOUNTER — Encounter: Payer: Self-pay | Admitting: Physical Therapy

## 2020-03-25 ENCOUNTER — Ambulatory Visit: Payer: Medicare Other | Admitting: Physical Therapy

## 2020-03-25 ENCOUNTER — Other Ambulatory Visit: Payer: Self-pay

## 2020-03-25 DIAGNOSIS — R262 Difficulty in walking, not elsewhere classified: Secondary | ICD-10-CM

## 2020-03-25 DIAGNOSIS — M6281 Muscle weakness (generalized): Secondary | ICD-10-CM

## 2020-03-25 NOTE — Therapy (Signed)
[place] MAIN [facility] SERVICES [address] [place], [place], [address] Phone: [phone]   Fax:  [phone]  Physical Therapy Treatment  Patient Details  Name: Sydney Mcdaniel MRN: [id number] Date of Birth: 1925-06-13 Referring Provider (PT): Delsa Grana   Encounter Date: 03/25/2020   PT End of Session - 03/25/20 1106    Visit Number 8    Number of Visits 17    Date for PT Re-Evaluation 04/14/20    PT Start Time 1102    PT Stop Time 1145    PT Time Calculation (min) 43 min    Equipment Utilized During Treatment Gait belt    Activity Tolerance Patient tolerated treatment well    Behavior During Therapy [facility] for tasks assessed/performed           Past Medical History:  Diagnosis Date  . Allergy   . Anemia   . Cataract   . CLL (chronic lymphocytic leukemia) ([place])   . Glaucoma   . H/O: hysterectomy    Total  . Hyperlipidemia   . Hypertension   . Hypothyroidism   . Impaired fasting glucose   . Lichen sclerosus   . Osteoporosis    Hips  . PAF (paroxysmal atrial fibrillation) ([place])    a. 06/15/2016 Event monitor: 4% afib burden; b. CHA2DS2VASc - 4-->Xarelto.  . Syncope    a. 03/2016 Echo: EF55-60%, no rwma, mild AI/MR, nl PASP; b. 03/2016 48h Holter: no significant arrhythmias/pauses; c. 03/2016 MV: mild apical defect, likely breast attenuation, nl EF, low risk; d. 05/2016 Event monitor: No significant arrhythmia; e. 05/2016 Event monitor: PAF (4%).    Past Surgical History:  Procedure Laterality Date  . APPENDECTOMY    . EYE SURGERY     Glaucoma  . TOTAL ABDOMINAL HYSTERECTOMY      There were no vitals filed for this visit.   Subjective Assessment - 03/25/20 1105    Subjective Patient would like to have better balance.    Pertinent History Patient had HHPT 10 months ago. Before that she was in the hospital with BP issues over night. She was walking with spc outdoors, inside she holds to the walls.    Limitations  Walking;Standing    How long can you stand comfortably? 15 minutes    How long can you walk comfortably? 5 mins    Patient Stated Goals to have better balance and walk better    Currently in Pain? No/denies    Pain Score 0-No pain           Treatment: Nu-step x 5 mins L 2  Stepping pattern fwd step x 10 , bwd step x 10  Heel rock on 1/2 foam x 20  Standing on 1/2 foam flat side up x 2 mins Stepping pattern 100 feet x 2 , heel toe with CGA Tapping foam to 6 inch step x 20  Four square fwd/bwd, side to side x 15  Standing on foam with balloon tapping x 20  Standing on foam feet together , with balloon tapping x 20  Leg press 25 lbs x 20 x 2     Patient performed with instruction, verbal cues, tactile cues of therapist: goal: increase tissue extensibility, promote proper posture, improve mobility                         PT Education - 03/25/20 1106    Education Details HEP    Person(s) Educated Patient  Methods Explanation    Comprehension Need further instruction;Verbalized understanding            PT Short Term Goals - 02/19/20 1434      PT SHORT TERM GOAL #1   Title Patient (> 4 years old) will complete five times sit to stand test in < 15 seconds indicating an increased LE strength and improved balance.    Time 4    Period Weeks    Status New             PT Long Term Goals - 02/19/20 1431      PT LONG TERM GOAL #1   Title Patient will increase BLE gross strength to 4+/5 as to improve functional strength for independent gait, increased standing tolerance and increased ADL ability.    Time 8    Period Weeks    Status New    Target Date 04/14/20      PT LONG TERM GOAL #2   Title Patient will increase Berg Balance score by > 6 points to demonstrate decreased fall risk during functional activities.    Time 8    Period Weeks    Status New    Target Date 04/14/20      PT LONG TERM GOAL #3   Title Patient will reduce timed up and go  to <11 seconds to reduce fall risk and demonstrate improved transfer/gait ability.    Time 8    Status New    Target Date 04/14/20      PT LONG TERM GOAL #4   Title Patient will increase 10 meter walk test to >1.62m/s as to improve gait speed for better community ambulation and to reduce fall risk.    Time 8    Period Weeks    Status New    Target Date 04/14/20                 Plan - 03/25/20 1106    Clinical Impression Statement Patient instructed in beginning balance and coordination exercise. Patient required mod VCs and min A for gait to improve weight shift and postural control. Patient requires min VCs to improve step length with gait.  Patients would benefit from additional skilled PT intervention to improve balance/gait safety and reduce fall risk.   Personal Factors and Comorbidities Age;Comorbidity 1;Comorbidity 2    Comorbidities HTN, osteoporosis, visual impairment, back pain, incontenence, Cancer    Examination-Activity Limitations Locomotion Level;Stairs    Stability/Clinical Decision Making Stable/Uncomplicated    PT Frequency 2x / week    PT Duration 8 weeks    PT Treatment/Interventions Neuromuscular re-education;Therapeutic exercise;Therapeutic activities;Balance training;Functional mobility training;Gait training           Patient will benefit from skilled therapeutic intervention in order to improve the following deficits and impairments:  Abnormal gait, Decreased activity tolerance, Decreased endurance, Decreased knowledge of use of DME, Impaired flexibility, Difficulty walking, Decreased balance, Pain  Visit Diagnosis: Muscle weakness (generalized)  Difficulty in walking, not elsewhere classified     Problem List Patient Active Problem List   Diagnosis Date Noted  . Iron deficiency anemia 03/08/2020  . Goals of care, counseling/discussion 03/08/2020  . 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 Sinus bradycardia 04/09/2019  . First  degree AV block 04/09/2019  . Postural dizziness with presyncope 02/25/2019  . Osteoporosis 08/21/2018  . Osteoarthritis of left hip 05/21/2018  . Bilateral carotid artery stenosis 04/19/2018  . History of syncope 10/17/2017  . MCI (mild cognitive  impairment) 08/12/2017  . Lung nodules 07/09/2017  . Paroxysmal atrial fibrillation ([place]) 04/18/2017  . Pleural effusion 10/20/2016  . Hyponatremia 10/20/2016  . Chronic diastolic congestive heart failure ([place]) 06/28/2016  . Primary open-angle glaucoma, bilateral, mild stage 05/08/2016  . Labile blood glucose 04/27/2016  . Bladder prolapse, female, acquired 02/25/2016  . 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  . B12 deficiency 01/11/2016  . Anemia 01/10/2016  . CLL (chronic lymphocytic leukemia) ([place]) 01/10/2016  . 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 12/21/2015  . Bradycardia 12/21/2015  . Lymphocytosis 06/25/2015  . Impaired fasting glucose 06/22/2015  . Carotid atherosclerosis 06/22/2015  . Essential hypertension 12/22/2014  . Hyperlipidemia 12/22/2014  . Hypothyroidism 12/22/2014    Alanson Puls, PT DPT 03/25/2020, 11:07 AM  [place] MAIN [facility] SERVICES [address] [place], [place], [address] Phone: [phone]   Fax:  [phone]  Name: Sydney Mcdaniel MRN: [id number] Date of Birth: Sep 12, 1924

## 2020-03-29 ENCOUNTER — Other Ambulatory Visit: Payer: Self-pay

## 2020-03-29 ENCOUNTER — Encounter: Payer: Self-pay | Admitting: Physical Therapy

## 2020-03-29 ENCOUNTER — Ambulatory Visit: Payer: Medicare Other | Attending: Family Medicine | Admitting: Physical Therapy

## 2020-03-29 DIAGNOSIS — R262 Difficulty in walking, not elsewhere classified: Secondary | ICD-10-CM | POA: Insufficient documentation

## 2020-03-29 DIAGNOSIS — M6281 Muscle weakness (generalized): Secondary | ICD-10-CM | POA: Insufficient documentation

## 2020-03-29 NOTE — Therapy (Signed)
[place] MAIN [facility] SERVICES [address] [place], [place], [address] Phone: [phone]   Fax:  [phone]  Physical Therapy Treatment  Patient Details  Name: Sydney Mcdaniel MRN: [id number] Date of Birth: [birth year] Referring Provider (PT): Delsa Grana   Encounter Date: 03/29/2020   PT End of Session - 03/29/20 1127    Visit Number 9    Number of Visits 17    Date for PT Re-Evaluation 04/14/20    PT Start Time 1100    PT Stop Time 1145    PT Time Calculation (min) 45 min    Equipment Utilized During Treatment Gait belt    Activity Tolerance Patient tolerated treatment well    Behavior During Therapy [facility] for tasks assessed/performed           Past Medical History:  Diagnosis Date  . Allergy   . Anemia   . Cataract   . CLL (chronic lymphocytic leukemia) ([place])   . Glaucoma   . H/O: hysterectomy    Total  . Hyperlipidemia   . Hypertension   . Hypothyroidism   . Impaired fasting glucose   . Lichen sclerosus   . Osteoporosis    Hips  . PAF (paroxysmal atrial fibrillation) ([place])    a. 06/15/2016 Event monitor: 4% afib burden; b. CHA2DS2VASc - 4-->Xarelto.  . Syncope    a. 03/2016 Echo: EF55-60%, no rwma, mild AI/MR, nl PASP; b. 03/2016 48h Holter: no significant arrhythmias/pauses; c. 03/2016 MV: mild apical defect, likely breast attenuation, nl EF, low risk; d. 05/2016 Event monitor: No significant arrhythmia; e. 05/2016 Event monitor: PAF (4%).    Past Surgical History:  Procedure Laterality Date  . APPENDECTOMY    . EYE SURGERY     Glaucoma  . TOTAL ABDOMINAL HYSTERECTOMY      There were no vitals filed for this visit.   Subjective Assessment - 03/29/20 1126    Subjective Patient would like to have better balance.    Pertinent History Patient had HHPT 10 months ago. Before that she was in the hospital with BP issues over night. She was walking with spc outdoors, inside she holds to the walls.    Limitations  Walking;Standing    How long can you stand comfortably? 15 minutes    How long can you walk comfortably? 5 mins    Patient Stated Goals to have better balance and walk better    Currently in Pain? No/denies           Treatment: Nu-step x 5 mins L 3  Neuromuscular training: side stepping balance beam left and right in parallel bars 10 feet x 3 standing on blue foam with head turns x 1 min  Standing feet together on blue foam with head turns x 1 min step ups from floor to 6 inch stool x 20 bilateral Step ups from blue foam to 6 inch stool x 20 BLE stepping pattern with weight shifting fwd/bwd x 10.  Weight shifting over feet in parallel bars x 10  Step fwd, then bwd same side in parallel bars LLE , then RLE x 10  Step ups from floor to stool x 20 with min assist LE on foam, LE on stool and hands together leaning fwd x 20 each side with min asssit  Step ups from blue foam to stool x 20 with min assist  Rocker board fwd/ bwd x 20 reps, change foot position with CGA Patient performed with instruction, verbal cues, tactile  cues of therapist: goal:increase tissue extensibility, promote proper posture, improve mobility                          PT Education - 03/29/20 1126    Education Details HEP    Person(s) Educated Patient    Methods Explanation    Comprehension Verbalized understanding            PT Short Term Goals - 02/19/20 1434      PT SHORT TERM GOAL #1   Title Patient (> 6 years old) will complete five times sit to stand test in < 15 seconds indicating an increased LE strength and improved balance.    Time 4    Period Weeks    Status New             PT Long Term Goals - 02/19/20 1431      PT LONG TERM GOAL #1   Title Patient will increase BLE gross strength to 4+/5 as to improve functional strength for independent gait, increased standing tolerance and increased ADL ability.    Time 8    Period Weeks    Status New    Target Date  04/14/20      PT LONG TERM GOAL #2   Title Patient will increase Berg Balance score by > 6 points to demonstrate decreased fall risk during functional activities.    Time 8    Period Weeks    Status New    Target Date 04/14/20      PT LONG TERM GOAL #3   Title Patient will reduce timed up and go to <11 seconds to reduce fall risk and demonstrate improved transfer/gait ability.    Time 8    Status New    Target Date 04/14/20      PT LONG TERM GOAL #4   Title Patient will increase 10 meter walk test to >1.69m/s as to improve gait speed for better community ambulation and to reduce fall risk.    Time 8    Period Weeks    Status New    Target Date 04/14/20                 Plan - 03/29/20 1127    Clinical Impression Statement Patient instructed in beginning balance and coordination exercise. Patient required mod VCs and min A for gait to improve weight shift and postural control. Patient requires min VCs to improve ankle stability with gait.  Patients would benefit from additional skilled PT intervention to improve balance/gait safety and reduce fall risk.   Personal Factors and Comorbidities Age;Comorbidity 1;Comorbidity 2    Comorbidities HTN, osteoporosis, visual impairment, back pain, incontenence, Cancer    Examination-Activity Limitations Locomotion Level;Stairs    Stability/Clinical Decision Making Stable/Uncomplicated    PT Frequency 2x / week    PT Duration 8 weeks    PT Treatment/Interventions Neuromuscular re-education;Therapeutic exercise;Therapeutic activities;Balance training;Functional mobility training;Gait training           Patient will benefit from skilled therapeutic intervention in order to improve the following deficits and impairments:  Abnormal gait, Decreased activity tolerance, Decreased endurance, Decreased knowledge of use of DME, Impaired flexibility, Difficulty walking, Decreased balance, Pain  Visit Diagnosis: Muscle weakness  (generalized)  Difficulty in walking, not elsewhere classified     Problem List Patient Active Problem List   Diagnosis Date Noted  . Iron deficiency anemia 03/08/2020  . Goals of care, counseling/discussion 03/08/2020  .  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 Sinus bradycardia 04/09/2019  . First degree AV block 04/09/2019  . Postural dizziness with presyncope 02/25/2019  . Osteoporosis 08/21/2018  . Osteoarthritis of left hip 05/21/2018  . Bilateral carotid artery stenosis 04/19/2018  . History of syncope 10/17/2017  . MCI (mild cognitive impairment) 08/12/2017  . Lung nodules 07/09/2017  . Paroxysmal atrial fibrillation ([place]) 04/18/2017  . Pleural effusion 10/20/2016  . Hyponatremia 10/20/2016  . Chronic diastolic congestive heart failure ([place]) 06/28/2016  . Primary open-angle glaucoma, bilateral, mild stage 05/08/2016  . Labile blood glucose 04/27/2016  . Bladder prolapse, female, acquired 02/25/2016  . 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  . B12 deficiency 01/11/2016  . Anemia 01/10/2016  . CLL (chronic lymphocytic leukemia) ([place]) 01/10/2016  . 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 12/21/2015  . Bradycardia 12/21/2015  . Lymphocytosis 06/25/2015  . Impaired fasting glucose 06/22/2015  . Carotid atherosclerosis 06/22/2015  . Essential hypertension 12/22/2014  . Hyperlipidemia 12/22/2014  . Hypothyroidism 12/22/2014    Alanson Puls, PT DPT 03/29/2020, 11:28 AM  [place] MAIN [facility] SERVICES [address] [place], [place], [address] Phone: [phone]   Fax:  [phone]  Name: ROSSELYN MARTHA MRN: [id number] Date of Birth: 04-Apr-1925

## 2020-04-01 ENCOUNTER — Other Ambulatory Visit: Payer: Self-pay

## 2020-04-01 ENCOUNTER — Ambulatory Visit: Payer: Medicare Other

## 2020-04-01 DIAGNOSIS — M6281 Muscle weakness (generalized): Secondary | ICD-10-CM | POA: Diagnosis not present

## 2020-04-01 DIAGNOSIS — R262 Difficulty in walking, not elsewhere classified: Secondary | ICD-10-CM

## 2020-04-01 NOTE — Therapy (Signed)
[place] MAIN [facility] SERVICES [address] [place], [place], [address] Phone: [phone]   Fax:  [phone]  Physical Therapy Treatment: 10th visit reassessment   Patient Details  Name: Sydney Mcdaniel MRN: [id number] Date of Birth: 09-21-1924 Referring Provider (PT): Delsa Grana   Encounter Date: 04/01/2020   PT End of Session - 04/01/20 1351    Visit Number 10    Number of Visits 17    Date for PT Re-Evaluation 04/14/20    PT Start Time 1950    PT Stop Time 1422    PT Time Calculation (min) 40 min    Equipment Utilized During Treatment Gait belt    Activity Tolerance Patient tolerated treatment well    Behavior During Therapy WFL for tasks assessed/performed           Past Medical History:  Diagnosis Date  . Allergy   . Anemia   . Cataract   . CLL (chronic lymphocytic leukemia) ([place])   . Glaucoma   . H/O: hysterectomy    Total  . Hyperlipidemia   . Hypertension   . Hypothyroidism   . Impaired fasting glucose   . Lichen sclerosus   . Osteoporosis    Hips  . PAF (paroxysmal atrial fibrillation) ([place])    a. 06/15/2016 Event monitor: 4% afib burden; b. CHA2DS2VASc - 4-->Xarelto.  . Syncope    a. 03/2016 Echo: EF55-60%, no rwma, mild AI/MR, nl PASP; b. 03/2016 48h Holter: no significant arrhythmias/pauses; c. 03/2016 MV: mild apical defect, likely breast attenuation, nl EF, low risk; d. 05/2016 Event monitor: No significant arrhythmia; e. 05/2016 Event monitor: PAF (4%).    Past Surgical History:  Procedure Laterality Date  . APPENDECTOMY    . EYE SURGERY     Glaucoma  . TOTAL ABDOMINAL HYSTERECTOMY      There were no vitals filed for this visit.   Subjective Assessment - 04/01/20 1347    Subjective Pt here for 10th visit reassessment day. Updates are taken from DTR. Pt recently got Covid booster without any remarkable side effects. Pt/DTR report improved mobility and confidence since starting PT. No falls since  starting therapy. Pt has had no issues remaining compliant with HEP.    Pertinent History Patient had HHPT 10 months ago. Before that she was in the hospital with BP issues over night. She was walking with spc outdoors, inside she holds to the walls.    Currently in Pain? No/denies              [facility] PT Assessment - 04/01/20 0001      ROM / Strength   AROM / PROM / Strength Strength      Strength   Strength Assessment Site Hip;Knee;Ankle    Right/Left Hip Right;Left    Right Hip Flexion 5/5    Right Hip External Rotation  5/5    Right Hip Internal Rotation 5/5    Right Hip ABduction 4+/5   (tested in supine, pt unable to perform in sidelying)    Left Hip Flexion 5/5    Left Hip External Rotation 5/5    Left Hip Internal Rotation 5/5    Left Hip ABduction 4+/5   (tested in supine, pt unable to perform in sidelying)    Right/Left Knee Right;Left    Right Knee Flexion 5/5    Right Knee Extension 5/5    Left Knee Flexion 5/5    Left Knee Extension 5/5    Right/Left Ankle  Right;Left    Right Ankle Dorsiflexion 5/5    Left Ankle Dorsiflexion 5/5      Transfers   Five time sit to stand comments  20.57sec (hands free,standard chair)    26.25sec at Eval      Standardized Balance Assessment   Standardized Balance Assessment Berg Balance Test;10 meter walk test;Timed Up and Go Test    10 Meter Walk 19.21sec self selected; 14.27 sec fastest (carries SPC for 'fastest')   0.72m/s, 0.56m/s respectively      Berg Balance Test   Sit to Stand Able to stand without using hands and stabilize independently    Standing Unsupported Able to stand safely 2 minutes    Sitting with Back Unsupported but Feet Supported on Floor or Stool Able to sit safely and securely 2 minutes    Stand to Sit Sits safely with minimal use of hands    Transfers Able to transfer safely, definite need of hands    Standing Unsupported with Eyes Closed Able to stand 10 seconds safely    Standing Unsupported with Feet  Together Able to place feet together independently but unable to hold for 30 seconds    From Standing, Reach Forward with Outstretched Arm Can reach forward >12 cm safely (5")    From Standing Position, Pick up Object from Floor Able to pick up shoe, needs supervision    From Standing Position, Turn to Look Behind Over each Shoulder Turn sideways only but maintains balance    Turn 360 Degrees Able to turn 360 degrees safely but slowly    Standing Unsupported, Alternately Place Feet on Step/Stool Able to complete >2 steps/needs minimal assist   catches foot more than once   Standing Unsupported, One Foot in Front Able to take small step independently and hold 30 seconds   would be 3 if willing to perform without minGaurd assist   Standing on One Leg Tries to lift leg/unable to hold 3 seconds but remains standing independently    Total Score 39      Timed Up and Go Test   TUG Normal TUG    Normal TUG (seconds) 27.39   no SPC (minGuard assist); 27.24sec at eval           Nustep Level 3 x 5 minutes, Seat 5, Arms 7  *minguard assist provided for much of session due to falls anxiety     PT Short Term Goals - 04/01/20 1438      PT SHORT TERM GOAL #1   Title Patient (> 5 years old) will complete five times sit to stand test in < 15 seconds indicating an increased LE strength and improved balance.    Time 4    Period Weeks    Status On-going             PT Long Term Goals - 04/01/20 1439      PT LONG TERM GOAL #1   Title Patient will increase BLE gross strength to 4+/5 as to improve functional strength for independent gait, increased standing tolerance and increased ADL ability.    Baseline 8    Time 8    Period Weeks    Status Achieved    Target Date 04/14/20      PT LONG TERM GOAL #2   Title Patient will increase Berg Balance score toi >45  to decrease falls risk.    Baseline @eval  28; 10/7: 39.    Time 8    Period Weeks  Status Revised    Target Date 04/14/20      PT  LONG TERM GOAL #3   Title Patient will reduce timed up and go to <11 seconds to reduce fall risk and demonstrate improved transfer/gait ability.    Baseline At eval 27sec, 10/7: 27sec (some confusion about sitting at end) eschews [facility] use    Time 8    Period Weeks    Status On-going    Target Date 04/14/20      PT LONG TERM GOAL #4   Title Patient will increase 10 meter walk test to >1.68m/s as to improve gait speed for better community ambulation and to reduce fall risk.    Baseline 48sec at eval; 10/7: 19sec self selected, 16sec fastest    Time 8    Period Weeks    Status On-going    Target Date 04/14/20                 Plan - 04/01/20 1444    Clinical Impression Statement Reassessmen tthis date, outcome measures updated, except for FOTO which is deferred due to time constraint. Pt shows progress in all outcome measures more so than anticipated/planned, except for TUG, which reemains similar. Pt remains anxious about sustaining a fall and is quite cautious and conservative in session, requires cuing and encouragement to convince that [facility] will provide hands-on assist with any LOB. Pt is pleased with outcome, as is DTR. Will conitnue to work toward goals for DC.    Personal Factors and Comorbidities Age;Comorbidity 1;Comorbidity 2    Comorbidities HTN, osteoporosis, visual impairment, back pain, incontenence, Cancer    Examination-Activity Limitations Locomotion Level;Stairs    Stability/Clinical Decision Making Stable/Uncomplicated    Clinical Decision Making Low    Rehab Potential Good    PT Frequency 2x / week    PT Duration 8 weeks    PT Treatment/Interventions Neuromuscular re-education;Therapeutic exercise;Therapeutic activities;Balance training;Functional mobility training;Gait training    PT Next Visit Plan FOTO; update HEP as needed    PT Home Exercise Plan needs a revision, renovation, redux    Consulted and Agree with Plan of Care Patient;Family member/caregiver     Family Member Consulted DTR           Patient will benefit from skilled therapeutic intervention in order to improve the following deficits and impairments:  Abnormal gait, Decreased activity tolerance, Decreased endurance, Decreased knowledge of use of DME, Impaired flexibility, Difficulty walking, Decreased balance, Pain  Visit Diagnosis: Muscle weakness (generalized)  Difficulty in walking, not elsewhere classified     Problem List Patient Active Problem List   Diagnosis Date Noted  . Iron deficiency anemia 03/08/2020  . Goals of care, counseling/discussion 03/08/2020  . 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 Sinus bradycardia 04/09/2019  . First degree AV block 04/09/2019  . Postural dizziness with presyncope 02/25/2019  . Osteoporosis 08/21/2018  . Osteoarthritis of left hip 05/21/2018  . Bilateral carotid artery stenosis 04/19/2018  . History of syncope 10/17/2017  . MCI (mild cognitive impairment) 08/12/2017  . Lung nodules 07/09/2017  . Paroxysmal atrial fibrillation ([place]) 04/18/2017  . Pleural effusion 10/20/2016  . Hyponatremia 10/20/2016  . Chronic diastolic congestive heart failure ([place]) 06/28/2016  . Primary open-angle glaucoma, bilateral, mild stage 05/08/2016  . Labile blood glucose 04/27/2016  . Bladder prolapse, female, acquired 02/25/2016  . 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  . B12 deficiency 01/11/2016  . Anemia 01/10/2016  . CLL (chronic lymphocytic leukemia) ([place]) 01/10/2016  . Neoplasm of  uncertain behavior of skin of ear 12/21/2015  . 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 12/21/2015  . Bradycardia 12/21/2015  . Lymphocytosis 06/25/2015  . Impaired fasting glucose 06/22/2015  . Carotid atherosclerosis 06/22/2015  . Essential hypertension 12/22/2014  . Hyperlipidemia 12/22/2014  . Hypothyroidism 12/22/2014   2:49 PM, 04/01/20 Etta Grandchild, PT, DPT Physical Therapist - [facility]    Outpatient Physical Therapy- [place] [facility] 04/01/2020, 2:48 PM  [place] MAIN [facility] SERVICES [address] [place], [place], [address] Phone: [phone]   Fax:  [phone]  Name: TENAYA HILYER MRN: [id number] Date of Birth: [birth year]

## 2020-04-05 ENCOUNTER — Inpatient Hospital Stay: Payer: Medicare Other | Attending: Hematology and Oncology

## 2020-04-05 ENCOUNTER — Encounter: Payer: Self-pay | Admitting: Physical Therapy

## 2020-04-05 ENCOUNTER — Other Ambulatory Visit: Payer: Self-pay

## 2020-04-05 ENCOUNTER — Ambulatory Visit: Payer: Medicare Other | Admitting: Physical Therapy

## 2020-04-05 DIAGNOSIS — M6281 Muscle weakness (generalized): Secondary | ICD-10-CM

## 2020-04-05 DIAGNOSIS — E538 Deficiency of other specified B group vitamins: Secondary | ICD-10-CM

## 2020-04-05 DIAGNOSIS — R262 Difficulty in walking, not elsewhere classified: Secondary | ICD-10-CM

## 2020-04-05 MED ORDER — CYANOCOBALAMIN 1000 MCG/ML IJ SOLN
INTRAMUSCULAR | Status: AC
  Filled 2020-04-05: qty 1

## 2020-04-05 MED ORDER — CYANOCOBALAMIN 1000 MCG/ML IJ SOLN
1000.0000 ug | Freq: Once | INTRAMUSCULAR | Status: AC
  Administered 2020-04-05: 1000 ug via INTRAMUSCULAR

## 2020-04-05 NOTE — Therapy (Signed)
[place] MAIN [facility] SERVICES [address] [place], [place], [address] Phone: [phone]   Fax:  [phone]  Physical Therapy Treatment  Patient Details  Name: Sydney Mcdaniel MRN: [id number] Date of Birth: 02/28/1925 Referring Provider (PT): Delsa Grana   Encounter Date: 04/05/2020   PT End of Session - 04/05/20 1259    Visit Number 11    Number of Visits 17    Date for PT Re-Evaluation 04/14/20    PT Start Time 6237    PT Stop Time 1225    PT Time Calculation (min) 40 min           Past Medical History:  Diagnosis Date  . Allergy   . Anemia   . Cataract   . CLL (chronic lymphocytic leukemia) ([place])   . Glaucoma   . H/O: hysterectomy    Total  . Hyperlipidemia   . Hypertension   . Hypothyroidism   . Impaired fasting glucose   . Lichen sclerosus   . Osteoporosis    Hips  . PAF (paroxysmal atrial fibrillation) ([place])    a. 06/15/2016 Event monitor: 4% afib burden; b. CHA2DS2VASc - 4-->Xarelto.  . Syncope    a. 03/2016 Echo: EF55-60%, no rwma, mild AI/MR, nl PASP; b. 03/2016 48h Holter: no significant arrhythmias/pauses; c. 03/2016 MV: mild apical defect, likely breast attenuation, nl EF, low risk; d. 05/2016 Event monitor: No significant arrhythmia; e. 05/2016 Event monitor: PAF (4%).    Past Surgical History:  Procedure Laterality Date  . APPENDECTOMY    . EYE SURGERY     Glaucoma  . TOTAL ABDOMINAL HYSTERECTOMY      There were no vitals filed for this visit.   Subjective Assessment - 04/05/20 1259    Subjective Patient has no new concens.    Pertinent History Patient had HHPT 10 months ago. Before that she was in the hospital with BP issues over night. She was walking with spc outdoors, inside she holds to the walls.    Limitations Walking;Standing    How long can you stand comfortably? 15 minutes    How long can you walk comfortably? 5 mins    Patient Stated Goals to have better balance and walk better     Currently in Pain? No/denies    Pain Score 0-No pain           Treatment: Nu-step x 5 mins L 3  Neuromuscular training: side stepping balance beam left and right in parallel bars 10 feet x 3 standing on blue foam with head turns x 1 min  Standing feet together on blue foam with head turns x 1 min step ups from floor to 6 inch stool x 20 bilateral Step ups from blue foam to 6 inch stool x 20 BLE stepping pattern with weight shifting fwd/bwd x 10.  Weight shifting over feet in parallel bars x 10 Step fwd, then bwd same side in parallel bars LLE , then RLE x 10  Step ups from floor to stool x 20 with min assist LE on foam, LE on stool and hands together leaning fwd x 20 each side with min asssit Step ups from blue foam to stool x 20 with min assist   Patient performed with instruction, verbal cues, tactile cues of therapist: goal:increase tissue extensibility, promote proper posture, improve mobility  PT Education - 04/05/20 1259    Education Details HEP and safety    Person(s) Educated Patient    Methods Explanation    Comprehension Verbalized understanding            PT Short Term Goals - 04/01/20 1438      PT SHORT TERM GOAL #1   Title Patient (86 years old) will complete five times sit to stand test in < 15 seconds indicating an increased LE strength and improved balance.    Time 4    Period Weeks    Status On-going             PT Long Term Goals - 04/01/20 1439      PT LONG TERM GOAL #1   Title Patient will increase BLE gross strength to 4+/5 as to improve functional strength for independent gait, increased standing tolerance and increased ADL ability.    Baseline 8    Time 8    Period Weeks    Status Achieved    Target Date 04/14/20      PT LONG TERM GOAL #2   Title Patient will increase Berg Balance score toi >45  to decrease falls risk.    Baseline @eval  28; 10/7: 39.    Time 8    Period Weeks     Status Revised    Target Date 04/14/20      PT LONG TERM GOAL #3   Title Patient will reduce timed up and go to <11 seconds to reduce fall risk and demonstrate improved transfer/gait ability.    Baseline At eval 27sec, 10/7: 27sec (some confusion about sitting at end) eschews [facility] use    Time 8    Period Weeks    Status On-going    Target Date 04/14/20      PT LONG TERM GOAL #4   Title Patient will increase 10 meter walk test to >1.19m/s as to improve gait speed for better community ambulation and to reduce fall risk.    Baseline 48sec at eval; 10/7: 19sec self selected, 16sec fastest    Time 8    Period Weeks    Status On-going    Target Date 04/14/20                 Plan - 04/05/20 1300    Clinical Impression Statement Patient instructed in beginning balance and coordination exercise. Patient required mod VCs and min A for gait to improve weight shift and postural control. Patient requires min VCs to improve ankle stability with gait.  Patients would benefit from additional skilled PT intervention to improve balance/gait safety and reduce fall risk.    Personal Factors and Comorbidities Age;Comorbidity 1;Comorbidity 2    Comorbidities HTN, osteoporosis, visual impairment, back pain, incontenence, Cancer    Examination-Activity Limitations Locomotion Level;Stairs    Stability/Clinical Decision Making Stable/Uncomplicated    Rehab Potential Good    PT Frequency 2x / week    PT Duration 8 weeks    PT Treatment/Interventions Neuromuscular re-education;Therapeutic exercise;Therapeutic activities;Balance training;Functional mobility training;Gait training    PT Next Visit Plan FOTO; update HEP as needed    PT Home Exercise Plan needs a revision, renovation, redux    Consulted and Agree with Plan of Care Patient;Family member/caregiver    Family Member Consulted DTR           Patient will benefit from skilled therapeutic intervention in order to improve the following  deficits and impairments:  Abnormal gait, Decreased  activity tolerance, Decreased endurance, Decreased knowledge of use of DME, Impaired flexibility, Difficulty walking, Decreased balance, Pain  Visit Diagnosis: Difficulty in walking, not elsewhere classified  Muscle weakness (generalized)     Problem List Patient Active Problem List   Diagnosis Date Noted  . Iron deficiency anemia 03/08/2020  . Goals of care, counseling/discussion 03/08/2020  . 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 Sinus bradycardia 04/09/2019  . First degree AV block 04/09/2019  . Postural dizziness with presyncope 02/25/2019  . Osteoporosis 08/21/2018  . Osteoarthritis of left hip 05/21/2018  . Bilateral carotid artery stenosis 04/19/2018  . History of syncope 10/17/2017  . MCI (mild cognitive impairment) 08/12/2017  . Lung nodules 07/09/2017  . Paroxysmal atrial fibrillation ([place]) 04/18/2017  . Pleural effusion 10/20/2016  . Hyponatremia 10/20/2016  . Chronic diastolic congestive heart failure ([place]) 06/28/2016  . Primary open-angle glaucoma, bilateral, mild stage 05/08/2016  . Labile blood glucose 04/27/2016  . Bladder prolapse, female, acquired 02/25/2016  . 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  . B12 deficiency 01/11/2016  . Anemia 01/10/2016  . CLL (chronic lymphocytic leukemia) ([place]) 01/10/2016  . 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 12/21/2015  . Bradycardia 12/21/2015  . Lymphocytosis 06/25/2015  . Impaired fasting glucose 06/22/2015  . Carotid atherosclerosis 06/22/2015  . Essential hypertension 12/22/2014  . Hyperlipidemia 12/22/2014  . Hypothyroidism 12/22/2014    Alanson Puls, PT DPT 04/05/2020, 1:02 PM  [place] MAIN [facility] SERVICES [address] [place], [place], [address] Phone: [phone]   Fax:  [phone]  Name: KALYSE MEHARG MRN: [id number] Date of  Birth: [birth year]

## 2020-04-07 NOTE — Progress Notes (Signed)
Name: Sydney Mcdaniel   MRN: [id number]    DOB: 1924-09-06   Date:04/08/2020       Progress Note  Chief Complaint  Patient presents with  . Hyperlipidemia  . Hypertension  . Hypothyroidism     Subjective:   Sydney Mcdaniel is a 86 y.o. female, presents to clinic for routine f/up  Hyperlipidemia: Currently treated with Lipitor 66m, pt reports good med compliance Last Lipids: Lab Results  Component Value Date   CHOL 153 07/30/2019   HDL 50 07/30/2019   LDLCALC 81 07/30/2019   TRIG 108 07/30/2019   CHOLHDL 3.1 07/30/2019   - Denies: Chest pain, shortness of breath, myalgias, claudication  Hypertension:  Currently managed on Lisinopril 2109mqd Amlodipine 16m81md  Pt reports good med compliance and denies any SE.   Blood pressure today is well controlled. BP Readings from Last 3 Encounters:  04/08/20 134/80  03/08/20 (!) 174/48  12/08/19 (!) 142/76   Pt denies CP, SOB, exertional sx, LE edema, palpitation, Ha's, visual disturbances, lightheadedness, hypotension, syncope. No episodes recently - see cardiology  Chronic hyponatremia - reviewed last labs  Anemia - chronic, stable H/H ~9.8-10.3 - labs frequently done by hem/onc reviewed their labs   Osteoporosis - on fosamax, supplementing with calcium and vit d, last dexa reviewed:  Hypothyroidism: Current Medication Regimen:  75 mcg synthroid MWFSun and 88 mcg Tu TH Sat Current Symptoms: denies fatigue, weight changes, heat/cold intolerance, bowel/skin changes or CVS symptoms  Lab Results  Component Value Date   TSH 3.03 10/21/2019  :     Current Outpatient Medications:  .  [START ON 06/08/2020] alendronate (FOSAMAX) 70 MG tablet, TAKE ONE TABLET BY MOUTH EVERY 7 DAYS. TAKE WITH A FULL GLASS OF WATER ON AN EMPTY STOMACH., Disp: 12 tablet, Rfl: 3 .  amLODipine (NORVASC) 5 MG tablet, Take 1 tablet (5 mg total) by mouth daily., Disp: 90 tablet, Rfl: 1 .  atorvastatin (LIPITOR) 20 MG tablet, Take 1 tablet (20 mg  total) by mouth at bedtime., Disp: 90 tablet, Rfl: 3 .  Calcium Carb-Cholecalciferol (CALCIUM 600 + D PO), Take 1 tablet by mouth daily. , Disp: , Rfl:  .  cholecalciferol (VITAMIN D) 1000 units tablet, Take 1,000 Units by mouth daily., Disp: , Rfl:  .  COMBIGAN 0.2-0.5 % ophthalmic solution, , Disp: , Rfl:  .  Cyanocobalamin (B-12) 1000 MCG/ML KIT, Inject 1,000 mcg as directed every 30 (thirty) days., Disp: , Rfl:  .  IRON PO, Take 1 tablet by mouth 2 (two) times daily. , Disp: , Rfl:  .  latanoprost (XALATAN) 0.005 % ophthalmic solution, Place 1 drop into the right eye at bedtime. , Disp: , Rfl:  .  levothyroxine (EUTHYROX) 75 MCG tablet, TAKE ONE TABLET BY MOUTH ON MONDAYS, WEDNESDAYS, FRIDAYS, AND SUNDAYS, Disp: 52 tablet, Rfl: 2 .  levothyroxine (EUTHYROX) 88 MCG tablet, TAKE 1 TABLET BY MOUTH ON TUESDAYS, THURSDAYS, AND SATURDAYS, Disp: 39 tablet, Rfl: 2 .  Multiple Vitamins-Minerals (MULTIVITAMIN ADULT PO), Take by mouth., Disp: , Rfl:  .  Omega-3 Fatty Acids (FISH OIL) 1200 MG CAPS, Take by mouth daily., Disp: , Rfl:  .  rivaroxaban (XARELTO) 20 MG TABS tablet, Take 1 tablet (20 mg total) by mouth daily with supper., Disp: 90 tablet, Rfl: 2 .  timolol (BETIMOL) 0.5 % ophthalmic solution, Place 1 drop into the right eye 2 (two) times daily., Disp: , Rfl:  .  timolol (TIMOPTIC) 0.5 % ophthalmic solution, , Disp: ,  Rfl:  .  ACCU-CHEK SOFTCLIX LANCETS lancets, Use as instructed; check once a day; H40.1131 (Patient not taking: Reported on 04/08/2020), Disp: 100 each, Rfl: 12 .  glucose blood (ACCU-CHEK AVIVA PLUS) test strip, Use as instructed, check once a day; H40.1131 (Patient not taking: Reported on 04/08/2020), Disp: 100 each, Rfl: 3 .  lisinopril (ZESTRIL) 20 MG tablet, Take 1 tablet (20 mg total) by mouth daily., Disp: 90 tablet, Rfl: 2  Patient Active Problem List   Diagnosis Date Noted  . Iron deficiency anemia 03/08/2020  . Goals of care, counseling/discussion 03/08/2020  .  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 Sinus bradycardia 04/09/2019  . First degree AV block 04/09/2019  . Postural dizziness with presyncope 02/25/2019  . Osteoporosis 08/21/2018  . Osteoarthritis of left hip 05/21/2018  . Bilateral carotid artery stenosis 04/19/2018  . History of syncope 10/17/2017  . MCI (mild cognitive impairment) 08/12/2017  . Lung nodules 07/09/2017  . Paroxysmal atrial fibrillation ([place]) 04/18/2017  . Pleural effusion 10/20/2016  . Hyponatremia 10/20/2016  . Chronic diastolic congestive heart failure ([place]) 06/28/2016  . Primary open-angle glaucoma, bilateral, mild stage 05/08/2016  . Labile blood glucose 04/27/2016  . Bladder prolapse, female, acquired 02/25/2016  . 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  . B12 deficiency 01/11/2016  . Anemia 01/10/2016  . CLL (chronic lymphocytic leukemia) ([place]) 01/10/2016  . 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 12/21/2015  . Bradycardia 12/21/2015  . Lymphocytosis 06/25/2015  . Impaired fasting glucose 06/22/2015  . Carotid atherosclerosis 06/22/2015  . Essential hypertension 12/22/2014  . Hyperlipidemia 12/22/2014  . Hypothyroidism 12/22/2014    Past Surgical History:  Procedure Laterality Date  . APPENDECTOMY    . EYE SURGERY     Glaucoma  . TOTAL ABDOMINAL HYSTERECTOMY      Family History  Problem Relation Age of Onset  . Heart attack Mother   . Glaucoma Mother   . Heart attack Father   . Parkinson's disease Brother   . Heart attack Brother     Social History   Tobacco Use  . Smoking status: Never Smoker  . Smokeless tobacco: Never Used  Vaping Use  . Vaping Use: Never used  Substance Use Topics  . Alcohol use: No  . Drug use: No     Allergies  Allergen Reactions  . Other   . Alphagan [Brimonidine] Itching and Rash  . Pravachol [Pravastatin Sodium] Itching and Rash  . Pravastatin Itching, Other (See Comments) and Rash     Health Maintenance  Topic Date Due  . URINE MICROALBUMIN  06/21/2016  . TETANUS/TDAP  06/28/2027  . INFLUENZA VACCINE  Completed  . DEXA SCAN  Completed  . COVID-19 Vaccine  Completed  . PNA vac Low Risk Adult  Completed    Chart Review Today: I personally reviewed active problem list, medication list, allergies, family history, social history, health maintenance, notes from last encounter, lab results, imaging with the patient/caregiver today.   Review of Systems  10 Systems reviewed and are negative for acute change except as noted in the HPI.  Objective:   Vitals:   04/08/20 0812  BP: 134/80  Pulse: 63  Resp: 14  Temp: 98.4 F (36.9 C)  TempSrc: Oral  SpO2: 99%  Weight: 143 lb 12.8 oz (65.2 kg)  Height: '5\' 3"'  (1.6 m)    Body mass index is 25.47 kg/m.  Physical Exam Vitals and nursing note reviewed.  Constitutional:      General: She is not in acute distress.    Appearance: Normal appearance. She is not toxic-appearing or diaphoretic.     Comments: Elderly, thin, appears well per baseline  HENT:     Head: Normocephalic and atraumatic.     Right Ear: External ear normal.     Left Ear: External ear normal.     Mouth/Throat:     Mouth: Mucous membranes are moist.     Pharynx: Oropharynx is clear.  Eyes:     General:        Right eye: No discharge.        Left eye: No discharge.     Conjunctiva/sclera: Conjunctivae normal.  Cardiovascular:     Rate and Rhythm: Normal rate and regular rhythm.     Pulses: Normal pulses.     Heart sounds: Normal heart sounds.  Pulmonary:     Effort: Pulmonary effort is normal. No respiratory distress.     Breath sounds: Normal breath sounds.  Abdominal:     General: Bowel sounds are normal.     Palpations: Abdomen is soft.  Musculoskeletal:     Right lower leg: No edema.     Left lower leg: No edema.  Skin:    General: Skin is warm and dry.     Coloration: Skin is not jaundiced or pale.  Neurological:     Mental  Status: She is alert. Mental status is at baseline.     Gait: Gait abnormal.  Psychiatric:        Mood and Affect: Mood normal.        Behavior: Behavior normal.         Assessment & Plan:     ICD-10-CM   1. Essential hypertension  I10    stable well controlled, BP at goal today - managed by cardiology - typically elevated at other OV and better here  2. Hypothyroidism, unspecified type  E03.9 levothyroxine (EUTHYROX) 75 MCG tablet    levothyroxine (EUTHYROX) 88 MCG tablet    TSH   stable dosing, 75 mcg 4d a week and 88 mcg 3d a week, last labs normal, will repeat prior to next OV  3. Mixed hyperlipidemia  E78.2 atorvastatin (LIPITOR) 20 MG tablet    Lipid panel   compliant with statin, last lipids Feb, repeat lipid panel annually, no SE or concerns  4. Abnormality of gait and mobility  R26.9    some improvement with working with PT  5. Orthostasis  I95.1    lightheaded with postural changes - mostly managed by cardiology, no syncopal episodes, no hypotension  6. Paroxysmal atrial fibrillation (HCC)  I48.0    per cardiology  7. Age-related osteoporosis without current pathological fracture  M81.0 alendronate (FOSAMAX) 70 MG tablet   taking fosamax, no dysphagia, appropriate med administration, reviewed supplements and other activity to support strong bone and prevent fx  8. Chronic heart failure with preserved ejection fraction (HCC) Chronic I50.32    no current sx, pt appears euvolemic, per cardiology  9. Hyponatremia  E87.1    chronic, it did improve with pushing some fluids daily with electrolytes, but cardiology noted increased BP and advised against, monitoring through labs      Return in about 4 months (around 08/09/2020) for Routine follow-up.   Delsa Grana, PA-C 04/08/20 9:54 AM

## 2020-04-08 ENCOUNTER — Other Ambulatory Visit: Payer: Self-pay

## 2020-04-08 ENCOUNTER — Ambulatory Visit: Payer: Medicare Other

## 2020-04-08 ENCOUNTER — Ambulatory Visit (INDEPENDENT_AMBULATORY_CARE_PROVIDER_SITE_OTHER): Payer: Medicare Other | Admitting: Family Medicine

## 2020-04-08 ENCOUNTER — Encounter: Payer: Self-pay | Admitting: Family Medicine

## 2020-04-08 VITALS — BP 134/80 | HR 63 | Temp 98.4°F | Resp 14 | Ht 63.0 in | Wt 143.8 lb

## 2020-04-08 DIAGNOSIS — I5032 Chronic diastolic (congestive) heart failure: Secondary | ICD-10-CM | POA: Diagnosis not present

## 2020-04-08 DIAGNOSIS — M810 Age-related osteoporosis without current pathological fracture: Secondary | ICD-10-CM

## 2020-04-08 DIAGNOSIS — E782 Mixed hyperlipidemia: Secondary | ICD-10-CM | POA: Diagnosis not present

## 2020-04-08 DIAGNOSIS — I951 Orthostatic hypotension: Secondary | ICD-10-CM

## 2020-04-08 DIAGNOSIS — R269 Unspecified abnormalities of gait and mobility: Secondary | ICD-10-CM

## 2020-04-08 DIAGNOSIS — I10 Essential (primary) hypertension: Secondary | ICD-10-CM | POA: Diagnosis not present

## 2020-04-08 DIAGNOSIS — I480 Paroxysmal atrial fibrillation: Secondary | ICD-10-CM

## 2020-04-08 DIAGNOSIS — E039 Hypothyroidism, unspecified: Secondary | ICD-10-CM | POA: Diagnosis not present

## 2020-04-08 DIAGNOSIS — R262 Difficulty in walking, not elsewhere classified: Secondary | ICD-10-CM

## 2020-04-08 DIAGNOSIS — M6281 Muscle weakness (generalized): Secondary | ICD-10-CM

## 2020-04-08 DIAGNOSIS — E871 Hypo-osmolality and hyponatremia: Secondary | ICD-10-CM

## 2020-04-08 MED ORDER — ATORVASTATIN CALCIUM 20 MG PO TABS: 20.0000 mg | ORAL_TABLET | Freq: Every day | ORAL | 3 refills | Status: DC

## 2020-04-08 MED ORDER — LEVOTHYROXINE SODIUM 75 MCG PO TABS: ORAL_TABLET | ORAL | 2 refills | Status: DC

## 2020-04-08 MED ORDER — LEVOTHYROXINE SODIUM 88 MCG PO TABS: ORAL_TABLET | ORAL | 2 refills | Status: DC

## 2020-04-08 MED ORDER — ALENDRONATE SODIUM 70 MG PO TABS: ORAL_TABLET | ORAL | 3 refills | Status: DC

## 2020-04-08 NOTE — Therapy (Signed)
[place] MAIN [facility] SERVICES [address] [place], [place], [address] Phone: [phone]   Fax:  [phone]  Physical Therapy Treatment  Patient Details  Name: Sydney Mcdaniel MRN: [id number] Date of Birth: 1924-11-21 Referring Provider (PT): Delsa Grana   Encounter Date: 04/08/2020   PT End of Session - 04/08/20 1212    Visit Number 12    Number of Visits 17    Date for PT Re-Evaluation 04/14/20    PT Start Time 1779    PT Stop Time 1230    PT Time Calculation (min) 45 min    Equipment Utilized During Treatment Gait belt    Activity Tolerance Patient tolerated treatment well    Behavior During Therapy [facility] for tasks assessed/performed           Past Medical History:  Diagnosis Date  . Allergy   . Anemia   . Cataract   . CLL (chronic lymphocytic leukemia) ([place])   . Glaucoma   . H/O: hysterectomy    Total  . Hyperlipidemia   . Hypertension   . Hypothyroidism   . Impaired fasting glucose   . Lichen sclerosus   . Osteoporosis    Hips  . PAF (paroxysmal atrial fibrillation) ([place])    a. 06/15/2016 Event monitor: 4% afib burden; b. CHA2DS2VASc - 4-->Xarelto.  . Syncope    a. 03/2016 Echo: EF55-60%, no rwma, mild AI/MR, nl PASP; b. 03/2016 48h Holter: no significant arrhythmias/pauses; c. 03/2016 MV: mild apical defect, likely breast attenuation, nl EF, low risk; d. 05/2016 Event monitor: No significant arrhythmia; e. 05/2016 Event monitor: PAF (4%).    Past Surgical History:  Procedure Laterality Date  . APPENDECTOMY    . EYE SURGERY     Glaucoma  . TOTAL ABDOMINAL HYSTERECTOMY      There were no vitals filed for this visit.   Subjective Assessment - 04/08/20 1151    Subjective Pt reports that she is doing well today. She saw her PCP today and denies any changes in medication. No falls since last therapy session. No specific questions or concerns at this time.    Pertinent History Patient had HHPT 10 months ago.  Before that she was in the hospital with BP issues over night. She was walking with spc outdoors, inside she holds to the walls.    Limitations Walking;Standing    How long can you stand comfortably? 15 minutes    How long can you walk comfortably? 5 mins    Patient Stated Goals to have better balance and walk better    Currently in Pain? No/denies                TREATMENT   Ther-ex  Nu-step L2 x 5 minutes during history (4 minutes unbilled); Sit to stand without UE support from regular height chair x 10; Sit to stand without UE support from regular height chair with Airex under feet x 10; Seated clams with manual resistance from therapist x 10; Seated adductor squeezes with manual resistance from therapist x 10;   Neuromuscular Re-education  No UE support and CGA provided for all balance exercises in // bars; 6" orange hurdle forward/backward step-overs without UE support x 10 leading with each leg; Airex WBOS static balance with eyes open/closed x 30s each; Airex WBOS/NBOS reaching with therapist placing ball on one side in different positions and pt returning ball on opposite side; Airex 6" alternating step taps x 10 BLE: Weaving between cones without  assistive device or UE support x multiple trials; Stepping over 1/2 foam rolls in sequencing without UE support or assistive device x multiple trials;   Patient performed with instruction, verbal cues, tactile cues of therapist: goal:increase tissue extensibility, promote proper posture, improve mobility   Patient demonstrates excellent elevation during session.  Continued with dynamic balance with focus on single-leg stance during stepping activities such as hurdle steps and step taps.  Also incorporated unstable surfaces such as Airex pad to further challenge balance.  Utilized sit to stands as well as a few seated exercises for lower extremity strengthening.  Patient encouraged to continue HEP and follow-up as scheduled.   Patient will continue benefit from skilled PT services to address deficits in mobility, balance, and improve function at home.                       PT Short Term Goals - 04/01/20 1438      PT SHORT TERM GOAL #1   Title Patient (> 84 years old) will complete five times sit to stand test in < 15 seconds indicating an increased LE strength and improved balance.    Time 4    Period Weeks    Status On-going             PT Long Term Goals - 04/01/20 1439      PT LONG TERM GOAL #1   Title Patient will increase BLE gross strength to 4+/5 as to improve functional strength for independent gait, increased standing tolerance and increased ADL ability.    Baseline 8    Time 8    Period Weeks    Status Achieved    Target Date 04/14/20      PT LONG TERM GOAL #2   Title Patient will increase Berg Balance score toi >45  to decrease falls risk.    Baseline @eval  28; 10/7: 39.    Time 8    Period Weeks    Status Revised    Target Date 04/14/20      PT LONG TERM GOAL #3   Title Patient will reduce timed up and go to <11 seconds to reduce fall risk and demonstrate improved transfer/gait ability.    Baseline At eval 27sec, 10/7: 27sec (some confusion about sitting at end) eschews [facility] use    Time 8    Period Weeks    Status On-going    Target Date 04/14/20      PT LONG TERM GOAL #4   Title Patient will increase 10 meter walk test to >1.59m/s as to improve gait speed for better community ambulation and to reduce fall risk.    Baseline 48sec at eval; 10/7: 19sec self selected, 16sec fastest    Time 8    Period Weeks    Status On-going    Target Date 04/14/20                 Plan - 04/08/20 1213    Clinical Impression Statement Patient demonstrates excellent elevation during session.  Continued with dynamic balance with focus on single-leg stance during stepping activities such as hurdle steps and step taps.  Also incorporated unstable surfaces such as Airex pad  to further challenge balance.  Utilized sit to stands as well as a few seated exercises for lower extremity strengthening.  Patient encouraged to continue HEP and follow-up as scheduled.  Patient will continue benefit from skilled PT services to address deficits in mobility, balance, and improve  function at home.    Personal Factors and Comorbidities Age;Comorbidity 1;Comorbidity 2    Comorbidities HTN, osteoporosis, visual impairment, back pain, incontenence, Cancer    Examination-Activity Limitations Locomotion Level;Stairs    Stability/Clinical Decision Making Stable/Uncomplicated    Rehab Potential Good    PT Frequency 2x / week    PT Duration 8 weeks    PT Treatment/Interventions Neuromuscular re-education;Therapeutic exercise;Therapeutic activities;Balance training;Functional mobility training;Gait training    PT Next Visit Plan FOTO; update HEP as needed    PT Home Exercise Plan needs a revision, renovation, redux    Consulted and Agree with Plan of Care Patient;Family member/caregiver    Family Member Consulted DTR           Patient will benefit from skilled therapeutic intervention in order to improve the following deficits and impairments:  Abnormal gait, Decreased activity tolerance, Decreased endurance, Decreased knowledge of use of DME, Impaired flexibility, Difficulty walking, Decreased balance, Pain  Visit Diagnosis: Difficulty in walking, not elsewhere classified  Muscle weakness (generalized)     Problem List Patient Active Problem List   Diagnosis Date Noted  . Iron deficiency anemia 03/08/2020  . Goals of care, counseling/discussion 03/08/2020  . 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 Sinus bradycardia 04/09/2019  . First degree AV block 04/09/2019  . Postural dizziness with presyncope 02/25/2019  . Osteoporosis 08/21/2018  . Osteoarthritis of left hip 05/21/2018  . Bilateral carotid artery stenosis 04/19/2018  . History of syncope 10/17/2017   . MCI (mild cognitive impairment) 08/12/2017  . Lung nodules 07/09/2017  . Paroxysmal atrial fibrillation ([place]) 04/18/2017  . Pleural effusion 10/20/2016  . Hyponatremia 10/20/2016  . Chronic diastolic congestive heart failure ([place]) 06/28/2016  . Primary open-angle glaucoma, bilateral, mild stage 05/08/2016  . Labile blood glucose 04/27/2016  . Bladder prolapse, female, acquired 02/25/2016  . 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  . B12 deficiency 01/11/2016  . Anemia 01/10/2016  . CLL (chronic lymphocytic leukemia) ([place]) 01/10/2016  . 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 12/21/2015  . Bradycardia 12/21/2015  . Lymphocytosis 06/25/2015  . Impaired fasting glucose 06/22/2015  . Carotid atherosclerosis 06/22/2015  . Essential hypertension 12/22/2014  . Hyperlipidemia 12/22/2014  . Hypothyroidism 12/22/2014    Phillips Grout PT, DPT, GCS  Javan Gonzaga 04/08/2020, 2:22 PM  [place] MAIN [facility] SERVICES [address] [place], [place], [address] Phone: [phone]   Fax:  [phone]  Name: LAVORA BRISBON MRN: [id number] Date of Birth: 1925-03-17

## 2020-04-11 ENCOUNTER — Other Ambulatory Visit: Payer: Self-pay

## 2020-04-11 ENCOUNTER — Emergency Department: Payer: Medicare Other

## 2020-04-11 ENCOUNTER — Inpatient Hospital Stay
Admission: EM | Admit: 2020-04-11 | Discharge: 2020-04-13 | DRG: 392 | Disposition: A | Discharge status: Home / Self Care | Payer: Medicare Other | Attending: Hospitalist | Admitting: Hospitalist

## 2020-04-11 DIAGNOSIS — R1084 Generalized abdominal pain: Secondary | ICD-10-CM | POA: Diagnosis not present

## 2020-04-11 DIAGNOSIS — Z7901 Long term (current) use of anticoagulants: Secondary | ICD-10-CM | POA: Diagnosis not present

## 2020-04-11 DIAGNOSIS — K529 Noninfective gastroenteritis and colitis, unspecified: Secondary | ICD-10-CM | POA: Diagnosis not present

## 2020-04-11 DIAGNOSIS — Z79899 Other long term (current) drug therapy: Secondary | ICD-10-CM | POA: Diagnosis not present

## 2020-04-11 DIAGNOSIS — Z8249 Family history of ischemic heart disease and other diseases of the circulatory system: Secondary | ICD-10-CM | POA: Diagnosis not present

## 2020-04-11 DIAGNOSIS — K922 Gastrointestinal hemorrhage, unspecified: Secondary | ICD-10-CM | POA: Diagnosis not present

## 2020-04-11 DIAGNOSIS — I5032 Chronic diastolic (congestive) heart failure: Secondary | ICD-10-CM | POA: Diagnosis not present

## 2020-04-11 DIAGNOSIS — Z90710 Acquired absence of both cervix and uterus: Secondary | ICD-10-CM | POA: Diagnosis not present

## 2020-04-11 DIAGNOSIS — H409 Unspecified glaucoma: Secondary | ICD-10-CM | POA: Diagnosis present

## 2020-04-11 DIAGNOSIS — Z9049 Acquired absence of other specified parts of digestive tract: Secondary | ICD-10-CM

## 2020-04-11 DIAGNOSIS — I480 Paroxysmal atrial fibrillation: Secondary | ICD-10-CM | POA: Diagnosis not present

## 2020-04-11 DIAGNOSIS — I110 Hypertensive heart disease with heart failure: Secondary | ICD-10-CM | POA: Diagnosis present

## 2020-04-11 DIAGNOSIS — K8020 Calculus of gallbladder without cholecystitis without obstruction: Secondary | ICD-10-CM | POA: Diagnosis not present

## 2020-04-11 DIAGNOSIS — K828 Other specified diseases of gallbladder: Secondary | ICD-10-CM | POA: Diagnosis not present

## 2020-04-11 DIAGNOSIS — C9110 Chronic lymphocytic leukemia of B-cell type not having achieved remission: Secondary | ICD-10-CM | POA: Diagnosis present

## 2020-04-11 DIAGNOSIS — R110 Nausea: Secondary | ICD-10-CM | POA: Diagnosis not present

## 2020-04-11 DIAGNOSIS — Z888 Allergy status to other drugs, medicaments and biological substances status: Secondary | ICD-10-CM | POA: Diagnosis not present

## 2020-04-11 DIAGNOSIS — Z20822 Contact with and (suspected) exposure to covid-19: Secondary | ICD-10-CM | POA: Diagnosis present

## 2020-04-11 DIAGNOSIS — K649 Unspecified hemorrhoids: Secondary | ICD-10-CM | POA: Diagnosis not present

## 2020-04-11 DIAGNOSIS — I10 Essential (primary) hypertension: Secondary | ICD-10-CM | POA: Diagnosis present

## 2020-04-11 DIAGNOSIS — A059 Bacterial foodborne intoxication, unspecified: Secondary | ICD-10-CM | POA: Diagnosis not present

## 2020-04-11 DIAGNOSIS — E039 Hypothyroidism, unspecified: Secondary | ICD-10-CM | POA: Diagnosis present

## 2020-04-11 DIAGNOSIS — K921 Melena: Secondary | ICD-10-CM | POA: Diagnosis not present

## 2020-04-11 DIAGNOSIS — Z83511 Family history of glaucoma: Secondary | ICD-10-CM

## 2020-04-11 DIAGNOSIS — R197 Diarrhea, unspecified: Secondary | ICD-10-CM | POA: Diagnosis not present

## 2020-04-11 DIAGNOSIS — R0902 Hypoxemia: Secondary | ICD-10-CM | POA: Diagnosis not present

## 2020-04-11 DIAGNOSIS — R52 Pain, unspecified: Secondary | ICD-10-CM | POA: Diagnosis not present

## 2020-04-11 LAB — CBC WITH DIFFERENTIAL/PLATELET
Abs Immature Granulocytes: 0.08 10*3/uL — ABNORMAL HIGH (ref 0.00–0.07)
Basophils Absolute: 0 10*3/uL (ref 0.0–0.1)
Basophils Relative: 0 %
Eosinophils Absolute: 0 10*3/uL (ref 0.0–0.5)
Eosinophils Relative: 0 %
HCT: 31.9 % — ABNORMAL LOW (ref 36.0–46.0)
Hemoglobin: 10.8 g/dL — ABNORMAL LOW (ref 12.0–15.0)
Immature Granulocytes: 1 %
Lymphocytes Relative: 25 %
Lymphs Abs: 4.2 10*3/uL — ABNORMAL HIGH (ref 0.7–4.0)
MCH: 31.2 pg (ref 26.0–34.0)
MCHC: 33.9 g/dL (ref 30.0–36.0)
MCV: 92.2 fL (ref 80.0–100.0)
Monocytes Absolute: 0.8 10*3/uL (ref 0.1–1.0)
Monocytes Relative: 5 %
Neutro Abs: 11.7 10*3/uL — ABNORMAL HIGH (ref 1.7–7.7)
Neutrophils Relative %: 69 %
Platelets: 179 10*3/uL (ref 150–400)
RBC: 3.46 MIL/uL — ABNORMAL LOW (ref 3.87–5.11)
RDW: 13.8 % (ref 11.5–15.5)
WBC: 16.9 10*3/uL — ABNORMAL HIGH (ref 4.0–10.5)
nRBC: 0 % (ref 0.0–0.2)

## 2020-04-11 LAB — COMPREHENSIVE METABOLIC PANEL
ALT: 18 U/L (ref 0–44)
AST: 24 U/L (ref 15–41)
Albumin: 3.8 g/dL (ref 3.5–5.0)
Alkaline Phosphatase: 23 U/L — ABNORMAL LOW (ref 38–126)
Anion gap: 11 (ref 5–15)
BUN: 22 mg/dL (ref 8–23)
CO2: 21 mmol/L — ABNORMAL LOW (ref 22–32)
Calcium: 9.2 mg/dL (ref 8.9–10.3)
Chloride: 101 mmol/L (ref 98–111)
Creatinine, Ser: 1.06 mg/dL — ABNORMAL HIGH (ref 0.44–1.00)
GFR, Estimated: 46 mL/min — ABNORMAL LOW (ref >60)
Glucose, Bld: 148 mg/dL — ABNORMAL HIGH (ref 70–99)
Potassium: 3.9 mmol/L (ref 3.5–5.1)
Sodium: 133 mmol/L — ABNORMAL LOW (ref 135–145)
Total Bilirubin: 0.9 mg/dL (ref 0.3–1.2)
Total Protein: 7.2 g/dL (ref 6.5–8.1)

## 2020-04-11 LAB — LIPASE, BLOOD: Lipase: 42 U/L (ref 11–51)

## 2020-04-11 LAB — CBC
HCT: 31.9 % — ABNORMAL LOW (ref 36.0–46.0)
Hemoglobin: 10.7 g/dL — ABNORMAL LOW (ref 12.0–15.0)
MCH: 30.9 pg (ref 26.0–34.0)
MCHC: 33.5 g/dL (ref 30.0–36.0)
MCV: 92.2 fL (ref 80.0–100.0)
Platelets: 194 10*3/uL (ref 150–400)
RBC: 3.46 MIL/uL — ABNORMAL LOW (ref 3.87–5.11)
RDW: 13.7 % (ref 11.5–15.5)
WBC: 12.8 10*3/uL — ABNORMAL HIGH (ref 4.0–10.5)
nRBC: 0 % (ref 0.0–0.2)

## 2020-04-11 LAB — ABO/RH: ABO/RH(D): A POS

## 2020-04-11 LAB — RESPIRATORY PANEL BY RT PCR (FLU A&B, COVID)
Influenza A by PCR: NEGATIVE
Influenza B by PCR: NEGATIVE
SARS Coronavirus 2 by RT PCR: NEGATIVE

## 2020-04-11 MED ORDER — SODIUM CHLORIDE 0.9 % IV SOLN
8.0000 mg/h | INTRAVENOUS | Status: DC
  Administered 2020-04-11 – 2020-04-12 (×2): 8 mg/h via INTRAVENOUS
  Filled 2020-04-11 (×2): qty 80

## 2020-04-11 MED ORDER — ACETAMINOPHEN 325 MG PO TABS: 650.0000 mg | ORAL_TABLET | Freq: Four times a day (QID) | ORAL | Status: DC | PRN

## 2020-04-11 MED ORDER — IOHEXOL 300 MG/ML  SOLN
100.0000 mL | Freq: Once | INTRAMUSCULAR | Status: AC | PRN
  Administered 2020-04-11: 100 mL via INTRAVENOUS

## 2020-04-11 MED ORDER — ACETAMINOPHEN 650 MG RE SUPP: 650.0000 mg | Freq: Four times a day (QID) | RECTAL | Status: DC | PRN

## 2020-04-11 MED ORDER — PIPERACILLIN-TAZOBACTAM 3.375 G IVPB
3.3750 g | Freq: Three times a day (TID) | INTRAVENOUS | Status: DC
  Administered 2020-04-12: 3.375 g via INTRAVENOUS
  Filled 2020-04-11: qty 50

## 2020-04-11 MED ORDER — SODIUM CHLORIDE 0.9 % IV SOLN: INTRAVENOUS | Status: DC

## 2020-04-11 MED ORDER — ONDANSETRON HCL 4 MG/2ML IJ SOLN: 4.0000 mg | Freq: Four times a day (QID) | INTRAMUSCULAR | Status: DC | PRN

## 2020-04-11 MED ORDER — MORPHINE SULFATE (PF) 2 MG/ML IV SOLN: 2.0000 mg | INTRAVENOUS | Status: DC | PRN

## 2020-04-11 MED ORDER — PIPERACILLIN-TAZOBACTAM 3.375 G IVPB 30 MIN
3.3750 g | Freq: Once | INTRAVENOUS | Status: AC
  Administered 2020-04-11: 3.375 g via INTRAVENOUS
  Filled 2020-04-11: qty 50

## 2020-04-11 MED ORDER — ONDANSETRON HCL 4 MG PO TABS: 4.0000 mg | ORAL_TABLET | Freq: Four times a day (QID) | ORAL | Status: DC | PRN

## 2020-04-11 MED ORDER — SODIUM CHLORIDE 0.9 % IV SOLN
80.0000 mg | Freq: Once | INTRAVENOUS | Status: AC
  Administered 2020-04-11: 80 mg via INTRAVENOUS
  Filled 2020-04-11: qty 80

## 2020-04-11 MED ORDER — IOHEXOL 9 MG/ML PO SOLN
500.0000 mL | Freq: Two times a day (BID) | ORAL | Status: DC | PRN
  Administered 2020-04-11 (×2): 500 mL via ORAL

## 2020-04-11 NOTE — ED Triage Notes (Signed)
Pt comes pov after having diarrhea and lower abdominal pain after eating her dinner.

## 2020-04-11 NOTE — ED Notes (Signed)
Pt cleansed of very dark stool. Pt given new gown and brief. Pt placed on bed. Blood in stool present from occult test. Shown to Vladimir Crofts, NP. Type and screen sent.

## 2020-04-11 NOTE — ED Provider Notes (Signed)
[facility] Emergency Department Provider Note ____________________________________________   First MD Initiated Contact with Patient 04/11/20 2055     (approximate)  I have reviewed the triage vital signs and the nursing notes.   HISTORY  Chief Complaint Abdominal Pain    HPI Sydney Mcdaniel is a 86 y.o. female with PMH as noted below who presents with diarrhea, nausea, vomiting, and lower abdominal pain, acute onset this afternoon after having lunch.  The bowel movements are bloody.  The patient reports a possible remote history of GI bleeding, but not in several years.  She reports lower abdominal pain which is nonradiating.  It is somewhat improved now but has been coming in waves.  Past Medical History:  Diagnosis Date  . Allergy   . Anemia   . Cataract   . CLL (chronic lymphocytic leukemia) ([place])   . Glaucoma   . H/O: hysterectomy    Total  . Hyperlipidemia   . Hypertension   . Hypothyroidism   . Impaired fasting glucose   . Lichen sclerosus   . Osteoporosis    Hips  . PAF (paroxysmal atrial fibrillation) ([place])    a. 06/15/2016 Event monitor: 4% afib burden; b. CHA2DS2VASc - 4-->Xarelto.  . Syncope    a. 03/2016 Echo: EF55-60%, no rwma, mild AI/MR, nl PASP; b. 03/2016 48h Holter: no significant arrhythmias/pauses; c. 03/2016 MV: mild apical defect, likely breast attenuation, nl EF, low risk; d. 05/2016 Event monitor: No significant arrhythmia; e. 05/2016 Event monitor: PAF (4%).    Patient Active Problem List   Diagnosis Date Noted  . Hematochezia 04/11/2020  . Iron deficiency anemia 03/08/2020  . Goals of care, counseling/discussion 03/08/2020  . 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 Sinus bradycardia 04/09/2019  . First degree AV block 04/09/2019  . Postural dizziness with presyncope 02/25/2019  . Osteoporosis 08/21/2018  . Osteoarthritis of left hip 05/21/2018  . Bilateral carotid artery stenosis 04/19/2018  .  History of syncope 10/17/2017  . MCI (mild cognitive impairment) 08/12/2017  . Lung nodules 07/09/2017  . Paroxysmal atrial fibrillation ([place]) 04/18/2017  . Pleural effusion 10/20/2016  . Hyponatremia 10/20/2016  . Chronic diastolic congestive heart failure ([place]) 06/28/2016  . Primary open-angle glaucoma, bilateral, mild stage 05/08/2016  . Labile blood glucose 04/27/2016  . Bladder prolapse, female, acquired 02/25/2016  . 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  . B12 deficiency 01/11/2016  . Anemia 01/10/2016  . CLL (chronic lymphocytic leukemia) ([place]) 01/10/2016  . 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 12/21/2015  . Bradycardia 12/21/2015  . Lymphocytosis 06/25/2015  . Impaired fasting glucose 06/22/2015  . Carotid atherosclerosis 06/22/2015  . Essential hypertension 12/22/2014  . Hyperlipidemia 12/22/2014  . Hypothyroidism 12/22/2014    Past Surgical History:  Procedure Laterality Date  . APPENDECTOMY    . EYE SURGERY     Glaucoma  . TOTAL ABDOMINAL HYSTERECTOMY      Prior to Admission medications   Medication Sig Start Date End Date Taking? Authorizing Provider  alendronate (FOSAMAX) 70 MG tablet TAKE ONE TABLET BY MOUTH EVERY 7 DAYS. TAKE WITH A FULL GLASS OF WATER ON AN EMPTY STOMACH. Patient taking differently: Take 70 mg by mouth every Friday. TAKE ONE TABLET BY MOUTH EVERY 7 DAYS. TAKE WITH A FULL GLASS OF WATER ON AN EMPTY STOMACH. 06/08/20  Yes Delsa Grana, PA-C  amLODipine (NORVASC) 5 MG tablet Take 1 tablet (5 mg total) by mouth  daily. 10/28/19  Yes End, Harrell Gave, MD  atorvastatin (LIPITOR) 20 MG tablet Take 1 tablet (20 mg total) by mouth at bedtime. 04/08/20  Yes Delsa Grana, PA-C  Calcium Carb-Cholecalciferol (CALCIUM 600 + D PO) Take 1 tablet by mouth daily.    Yes [provider]  cholecalciferol (VITAMIN D) 1000 units tablet Take 1,000 Units by mouth daily.   Yes [provider]    Cyanocobalamin (B-12) 1000 MCG/ML KIT Inject 1,000 mcg as directed every 30 (thirty) days.   Yes [provider]  ferrous sulfate 325 (65 FE) MG tablet Take 325 mg by mouth 2 (two) times daily with a meal.   Yes [provider]  latanoprost (XALATAN) 0.005 % ophthalmic solution Place 1 drop into the right eye at bedtime.  04/24/15  Yes [provider]  levothyroxine (EUTHYROX) 75 MCG tablet TAKE ONE TABLET BY MOUTH ON MONDAYS, [place], FRIDAYS, AND SUNDAYS Patient taking differently: Take 75 mcg by mouth as directed. TAKE ONE TABLET BY MOUTH ON MONDAYS, WEDNESDAYS, FRIDAYS, AND SUNDAYS 04/08/20  Yes Delsa Grana, PA-C  levothyroxine (EUTHYROX) 88 MCG tablet TAKE 1 TABLET BY MOUTH ON TUESDAYS, THURSDAYS, AND SATURDAYS Patient taking differently: Take 88 mcg by mouth as directed. TAKE 1 TABLET BY MOUTH ON TUESDAYS, THURSDAYS, AND SATURDAYS 04/08/20  Yes Delsa Grana, PA-C  lisinopril (ZESTRIL) 20 MG tablet Take 20 mg by mouth daily.   Yes [provider]  Multiple Vitamins-Minerals (MULTIVITAMIN ADULT PO) Take by mouth.   Yes [provider]  Omega-3 Fatty Acids (FISH OIL) 1200 MG CAPS Take 1,200 mg by mouth daily.    Yes [provider]  rivaroxaban (XARELTO) 20 MG TABS tablet Take 1 tablet (20 mg total) by mouth daily with supper. 07/16/19  Yes End, Harrell Gave, MD  timolol (TIMOPTIC) 0.5 % ophthalmic solution Place 1 drop into the right eye 2 (two) times daily.  01/01/20  Yes [provider]    Allergies Other, Alphagan [brimonidine], Pravachol [pravastatin sodium], and Pravastatin  Family History  Problem Relation Age of Onset  . Heart attack Mother   . Glaucoma Mother   . Heart attack Father   . Parkinson's disease Brother   . Heart attack Brother     Social History Social History   Tobacco Use  . Smoking status: Never Smoker  . Smokeless tobacco: Never Used  Vaping Use  . Vaping Use: Never used  Substance Use  Topics  . Alcohol use: No  . Drug use: No    Review of Systems  Constitutional: No fever. Eyes: No redness. ENT: No sore throat. Cardiovascular: Denies chest pain. Respiratory: Denies shortness of breath. Gastrointestinal: Positive for vomiting and diarrhea. Genitourinary: Negative for dysuria.  Musculoskeletal: Negative for back pain. Skin: Negative for rash. Neurological: Negative for headache.   ____________________________________________   PHYSICAL EXAM:  VITAL SIGNS: ED Triage Vitals  Enc Vitals Group     BP 04/11/20 1616 (!) 136/98     Pulse Rate 04/11/20 1616 (!) 58     Resp 04/11/20 1616 18     Temp 04/11/20 1616 98 F (36.7 C)     Temp Source 04/11/20 1616 Oral     SpO2 04/11/20 1616 100 %     Weight 04/11/20 1617 143 lb 4.8 oz (65 kg)     Height 04/11/20 1617 _0  (1.6 m)     Head Circumference --      Peak Flow --      Pain Score 04/11/20 1617 2  Pain Loc --      Pain Edu? --      Excl. in [place]? --     Constitutional: Alert and oriented.  Well appearing for age, in no acute distress. Eyes: EOMI.  No scleral icterus or conjunctival pallor. Head: Atraumatic. Nose: No congestion/rhinnorhea. Mouth/Throat: Mucous membranes are moist.   Neck: Normal range of motion.  Cardiovascular: Normal rate, regular rhythm. Good peripheral circulation. Respiratory: Normal respiratory effort.  No retractions.  Gastrointestinal: Soft with mild bilateral lower quadrant discomfort to palpation, but no significant focal tenderness or peritoneal signs.  No distention.  Genitourinary: Normal external genitalia. Musculoskeletal: Extremities warm and well perfused.  Neurologic:  Normal speech and language. No gross focal neurologic deficits are appreciated.  Skin:  Skin is warm and dry. No rash noted. Psychiatric: Mood and affect are normal. Speech and behavior are normal.  ____________________________________________   LABS (all labs ordered are listed, but only  abnormal results are displayed)  Labs Reviewed  COMPREHENSIVE METABOLIC PANEL - Abnormal; Notable for the following components:      Result Value   Sodium 133 (*)    CO2 21 (*)    Glucose, Bld 148 (*)    Creatinine, Ser 1.06 (*)    Alkaline Phosphatase 23 (*)    GFR, Estimated 46 (*)    All other components within normal limits  CBC - Abnormal; Notable for the following components:   WBC 12.8 (*)    RBC 3.46 (*)    Hemoglobin 10.7 (*)    HCT 31.9 (*)    All other components within normal limits  CBC WITH DIFFERENTIAL/PLATELET - Abnormal; Notable for the following components:   WBC 16.9 (*)    RBC 3.46 (*)    Hemoglobin 10.8 (*)    HCT 31.9 (*)    Neutro Abs 11.7 (*)    Lymphs Abs 4.2 (*)    Abs Immature Granulocytes 0.08 (*)    All other components within normal limits  RESPIRATORY PANEL BY RT PCR (FLU A&B, COVID)  LIPASE, BLOOD  URINALYSIS, COMPLETE (UACMP) WITH MICROSCOPIC  TYPE AND SCREEN  ABO/RH   ____________________________________________  EKG  ED ECG REPORT I, Arta Silence, the attending physician, personally viewed and interpreted this ECG.  Date: 04/11/2020 EKG Time: 1622 Rate: 63 Rhythm: normal sinus rhythm QRS Axis: Left axis Intervals: LBBB ST/T Wave abnormalities: normal Narrative Interpretation: no evidence of acute ischemia; no significant change when compared to EKG of 11/12/2019  ____________________________________________  RADIOLOGY  CT abdomen/pelvis: Focal colitis  ____________________________________________   PROCEDURES  Procedure(s) performed: No  Procedures  Critical Care performed: Yes  CRITICAL CARE Performed by: Arta Silence   Total critical care time: 30 minutes  Critical care time was exclusive of separately billable procedures and treating other patients.  Critical care was necessary to treat or prevent imminent or life-threatening deterioration.  Critical care was time spent personally by me on the  following activities: development of treatment plan with patient and/or surrogate as well as nursing, discussions with consultants, evaluation of patient's response to treatment, examination of patient, obtaining history from patient or surrogate, ordering and performing treatments and interventions, ordering and review of laboratory studies, ordering and review of radiographic studies, pulse oximetry and re-evaluation of patient's condition. ____________________________________________   INITIAL IMPRESSION / ASSESSMENT AND PLAN / ED COURSE  Pertinent labs & imaging results that were available during my care of the patient were reviewed by me and considered in my medical decision making (see chart for  details).  86 year old female with PMH as noted above presents with nausea, vomiting, diarrhea starting this afternoon after eating lunch.  The bowel movements have had frank blood.  The patient also reports some lower abdominal pain although this seems to have improved.  I reviewed the past medical records in Epic; the patient was most recently admitted last year with near syncope.  I do not see a prior history of GI bleeding.  She has paroxysmal atrial fibrillation and is on Xarelto.  Of note, the patient waited 5 hours prior to being placed in exam room for MD evaluation.  Her vital signs have remained stable during this time.  On exam, the patient is overall well-appearing for her age.  Her vital signs are normal.  The abdomen is soft with no focal tenderness or peritoneal signs.  She had a bowel movement while I was in the room, and the stool contained bright red blood.  Lab work-up was obtained at triage and is reassuring.  Initial hemoglobin is 10.7, which is in the patient's normal range.  Differential includes diverticulosis, acute diverticulitis, colitis, gastroenteritis.  Presentation is more consistent with lower GI bleed rather than upper GI source.  We will obtain a repeat CBC,  continue to closely monitor the patient, obtain a CT abdomen, and reassess.  I anticipate admission.  ----------------------------------------- 9:58 PM on 04/11/2020 -----------------------------------------  Repeat CBC shows slight increase in WBCs but hemoglobin is unchanged after 4 hours.  ----------------------------------------- 11:49 PM on 04/11/2020 -----------------------------------------  CT shows evidence of likely focal colitis.  I started the patient on Zosyn.  She remains hemodynamically stable.  I had already put her on a Protonix drip in case there could be an upper GI component to the bleeding, the patient did just have a bowel movement that appeared more like melena rather than bright red blood.  I discussed the case with the hospitalist Dr. Damita Dunnings. ____________________________________________   FINAL CLINICAL IMPRESSION(S) / ED DIAGNOSES  Final diagnoses:  Colitis  Gastrointestinal hemorrhage, unspecified gastrointestinal hemorrhage type      NEW MEDICATIONS STARTED DURING THIS VISIT:  New Prescriptions   No medications on file     Note:  This document was prepared using Dragon voice recognition software and may include unintentional dictation errors.    Arta Silence, MD 04/11/20 2350

## 2020-04-11 NOTE — ED Notes (Signed)
Pt cleaned from small black stool at this time.

## 2020-04-11 NOTE — ED Triage Notes (Signed)
Patient was out to lunch with her son, on the way home experiencing D/N/V, left groin pain.

## 2020-04-11 NOTE — ED Notes (Signed)
Patient's incontinence brief changed by this RN and Lucina Mellow. Patient had pooling blood in rectum. Dark red, watery blood leaked continuously while cleaning patient. Any pressure applied to buttocks resulted in fast/quick leakage of blood from rectum. Patient placed into clean brief. Patient roomed.

## 2020-04-12 ENCOUNTER — Ambulatory Visit: Payer: Medicare Other | Admitting: Physical Therapy

## 2020-04-12 ENCOUNTER — Encounter: Payer: Self-pay | Admitting: Family Medicine

## 2020-04-12 LAB — BASIC METABOLIC PANEL
Anion gap: 9 (ref 5–15)
BUN: 20 mg/dL (ref 8–23)
CO2: 23 mmol/L (ref 22–32)
Calcium: 8 mg/dL — ABNORMAL LOW (ref 8.9–10.3)
Chloride: 99 mmol/L (ref 98–111)
Creatinine, Ser: 0.8 mg/dL (ref 0.44–1.00)
GFR, Estimated: >60 mL/min (ref >60)
Glucose, Bld: 158 mg/dL — ABNORMAL HIGH (ref 70–99)
Potassium: 3.8 mmol/L (ref 3.5–5.1)
Sodium: 131 mmol/L — ABNORMAL LOW (ref 135–145)

## 2020-04-12 LAB — CBC
HCT: 28.5 % — ABNORMAL LOW (ref 36.0–46.0)
Hemoglobin: 10 g/dL — ABNORMAL LOW (ref 12.0–15.0)
MCH: 31.8 pg (ref 26.0–34.0)
MCHC: 35.1 g/dL (ref 30.0–36.0)
MCV: 90.8 fL (ref 80.0–100.0)
Platelets: 173 10*3/uL (ref 150–400)
RBC: 3.14 MIL/uL — ABNORMAL LOW (ref 3.87–5.11)
RDW: 14.1 % (ref 11.5–15.5)
WBC: 16.8 10*3/uL — ABNORMAL HIGH (ref 4.0–10.5)
nRBC: 0 % (ref 0.0–0.2)

## 2020-04-12 MED ORDER — TIMOLOL MALEATE 0.5 % OP SOLN
1.0000 [drp] | Freq: Two times a day (BID) | OPHTHALMIC | Status: DC
  Administered 2020-04-12 – 2020-04-13 (×2): 1 [drp] via OPHTHALMIC
  Filled 2020-04-12: qty 5

## 2020-04-12 MED ORDER — AMLODIPINE BESYLATE 5 MG PO TABS
5.0000 mg | ORAL_TABLET | Freq: Every day | ORAL | Status: DC
  Administered 2020-04-12 – 2020-04-13 (×2): 5 mg via ORAL
  Filled 2020-04-12 (×2): qty 1

## 2020-04-12 MED ORDER — CIPROFLOXACIN HCL 250 MG PO TABS
500.0000 mg | ORAL_TABLET | Freq: Two times a day (BID) | ORAL | Status: DC
  Administered 2020-04-12 – 2020-04-13 (×3): 500 mg via ORAL
  Filled 2020-04-12: qty 1
  Filled 2020-04-12: qty 2
  Filled 2020-04-12 (×3): qty 1

## 2020-04-12 MED ORDER — LISINOPRIL 20 MG PO TABS
20.0000 mg | ORAL_TABLET | Freq: Every day | ORAL | Status: DC
  Administered 2020-04-12 – 2020-04-13 (×2): 20 mg via ORAL
  Filled 2020-04-12 (×2): qty 1

## 2020-04-12 MED ORDER — LEVOTHYROXINE SODIUM 88 MCG PO TABS
88.0000 ug | ORAL_TABLET | Freq: Every day | ORAL | Status: DC
  Administered 2020-04-13: 88 ug via ORAL
  Filled 2020-04-12: qty 1

## 2020-04-12 MED ORDER — ATORVASTATIN CALCIUM 20 MG PO TABS
20.0000 mg | ORAL_TABLET | Freq: Every day | ORAL | Status: DC
  Administered 2020-04-12: 20 mg via ORAL
  Filled 2020-04-12: qty 1

## 2020-04-12 NOTE — ED Notes (Signed)
Admitting MD at bedside.

## 2020-04-12 NOTE — ED Notes (Signed)
This RN attempted to call report at this time with no answer 

## 2020-04-12 NOTE — Consult Note (Signed)
Sydney Mcdaniel , MD [address], [place], [place], [place], [address] [address], [place], [place], [place], [address] Phone: [phone]  Fax: [phone]  Consultation  Referring Provider:     Enzo Bi  Primary Care Physician:  Delsa Grana, PA-C Primary Gastroenterologist None          Reason for Consultation:   Melena   Date of Admission:  04/11/2020 Date of Consultation:  04/12/2020         HPI:   Sydney Mcdaniel is a 86 y.o. female presented to the hospital with abdominal pain and bloody diarrhea.  She is on Xarelto for atrial fibrillation which is paroxysmal.  She underwent a CT scan of the abdomen which demonstrated inflammatory changes in the sigmoid and rectum suggestive of focal colitis.  Prominent hemorrhoids noted.  3 months prior hemoglobin 9.8 with an MCV of 94 on admission hemoglobin 10.3 g which has been stable.  White cell count elevated at 16.8.  No elevation in BUN/creatinine ratio.  History obtained from patient and daughter in the room . She was doing well till Sunday ie yesterday , went to church and on way back hd lunch at a [employer]: sphaghetti , salad and a dressing. Within hours had nausea,vomiting, diarrhea with some blood stained stools. Since midnight no symptoms and abdominal pain in the :LLQ has almost resolved. No similar issues in the past. Last colonoscopy many years back was normal.   Past Medical History:  Diagnosis Date  . Allergy   . Anemia   . Cataract   . CLL (chronic lymphocytic leukemia) ([place])   . Glaucoma   . H/O: hysterectomy    Total  . Hyperlipidemia   . Hypertension   . Hypothyroidism   . Impaired fasting glucose   . Lichen sclerosus   . Osteoporosis    Hips  . PAF (paroxysmal atrial fibrillation) ([place])    a. 06/15/2016 Event monitor: 4% afib burden; b. CHA2DS2VASc - 4-->Xarelto.  . Syncope    a. 03/2016 Echo: EF55-60%, no rwma, mild AI/MR, nl PASP; b. 03/2016 48h Holter: no significant arrhythmias/pauses; c. 03/2016  MV: mild apical defect, likely breast attenuation, nl EF, low risk; d. 05/2016 Event monitor: No significant arrhythmia; e. 05/2016 Event monitor: PAF (4%).    Past Surgical History:  Procedure Laterality Date  . APPENDECTOMY    . EYE SURGERY     Glaucoma  . TOTAL ABDOMINAL HYSTERECTOMY      Prior to Admission medications   Medication Sig Start Date End Date Taking? Authorizing Provider  alendronate (FOSAMAX) 70 MG tablet TAKE ONE TABLET BY MOUTH EVERY 7 DAYS. TAKE WITH A FULL GLASS OF WATER ON AN EMPTY STOMACH. Patient taking differently: Take 70 mg by mouth every Friday. TAKE ONE TABLET BY MOUTH EVERY 7 DAYS. TAKE WITH A FULL GLASS OF WATER ON AN EMPTY STOMACH. 06/08/20  Yes Delsa Grana, PA-C  amLODipine (NORVASC) 5 MG tablet Take 1 tablet (5 mg total) by mouth daily. 10/28/19  Yes End, Harrell Gave, MD  atorvastatin (LIPITOR) 20 MG tablet Take 1 tablet (20 mg total) by mouth at bedtime. 04/08/20  Yes Delsa Grana, PA-C  Calcium Carb-Cholecalciferol (CALCIUM 600 + D PO) Take 1 tablet by mouth daily.    Yes [provider]  cholecalciferol (VITAMIN D) 1000 units tablet Take 1,000 Units by mouth daily.   Yes [provider]  Cyanocobalamin (B-12) 1000 MCG/ML KIT Inject 1,000 mcg as directed every 30 (thirty) days.   Yes  [provider]  ferrous sulfate 325 (65 FE) MG tablet Take 325 mg by mouth 2 (two) times daily with a meal.   Yes [provider]  latanoprost (XALATAN) 0.005 % ophthalmic solution Place 1 drop into the right eye at bedtime.  04/24/15  Yes [provider]  levothyroxine (EUTHYROX) 75 MCG tablet TAKE ONE TABLET BY MOUTH ON MONDAYS, [place], FRIDAYS, AND SUNDAYS Patient taking differently: Take 75 mcg by mouth as directed. TAKE ONE TABLET BY MOUTH ON MONDAYS, WEDNESDAYS, FRIDAYS, AND SUNDAYS 04/08/20  Yes Delsa Grana, PA-C  levothyroxine (EUTHYROX) 88 MCG tablet TAKE 1 TABLET BY MOUTH ON TUESDAYS, THURSDAYS, AND SATURDAYS Patient  taking differently: Take 88 mcg by mouth as directed. TAKE 1 TABLET BY MOUTH ON TUESDAYS, THURSDAYS, AND SATURDAYS 04/08/20  Yes Delsa Grana, PA-C  lisinopril (ZESTRIL) 20 MG tablet Take 20 mg by mouth daily.   Yes [provider]  Multiple Vitamins-Minerals (MULTIVITAMIN ADULT PO) Take by mouth.   Yes [provider]  Omega-3 Fatty Acids (FISH OIL) 1200 MG CAPS Take 1,200 mg by mouth daily.    Yes [provider]  rivaroxaban (XARELTO) 20 MG TABS tablet Take 1 tablet (20 mg total) by mouth daily with supper. 07/16/19  Yes End, Harrell Gave, MD  timolol (TIMOPTIC) 0.5 % ophthalmic solution Place 1 drop into the right eye 2 (two) times daily.  01/01/20  Yes [provider]    Family History  Problem Relation Age of Onset  . Heart attack Mother   . Glaucoma Mother   . Heart attack Father   . Parkinson's disease Brother   . Heart attack Brother      Social History   Tobacco Use  . Smoking status: Never Smoker  . Smokeless tobacco: Never Used  Vaping Use  . Vaping Use: Never used  Substance Use Topics  . Alcohol use: No  . Drug use: No    Allergies as of 04/11/2020 - Review Complete 04/11/2020  Allergen Reaction Noted  . Other  10/17/2017  . Alphagan [brimonidine] Itching and Rash 12/22/2014  . Pravachol [pravastatin sodium] Itching and Rash 10/19/2014  . Pravastatin Itching, Other (See Comments), and Rash 10/19/2014    Review of Systems:    All systems reviewed and negative except where noted in HPI.   Physical Exam:  Vital signs in last 24 hours: Temp:  [98 F (36.7 C)-98.2 F (36.8 C)] 98.2 F (36.8 C) (10/18 0344) Pulse Rate:  [58-82] 66 (10/18 0830) Resp:  [13-28] 13 (10/18 0830) BP: (124-157)/(41-98) 129/44 (10/18 0830) SpO2:  [95 %-100 %] 98 % (10/18 0830) Weight:  [65 kg] 65 kg (10/17 1617)   General:   Pleasant, cooperative in NAD Head:  Normocephalic and atraumatic. Eyes:   No icterus.   Conjunctiva pink. PERRLA. Ears:   Normal auditory acuity. Neck:  Supple; no masses or thyroidomegaly Lungs: Respirations even and unlabored. Lungs clear to auscultation bilaterally.   No wheezes, crackles, or rhonchi.  Heart:  Regular rate and rhythm;  Without murmur, clicks, rubs or gallops Abdomen:  Soft, nondistended, nontender. Normal bowel sounds. No appreciable masses or hepatomegaly.  No rebound or guarding.  Neurologic:  Alert and oriented x3;  grossly normal neurologically. Skin:  Intact without significant lesions or rashes. Cervical Nodes:  No significant cervical adenopathy. Psych:  Alert and cooperative. Normal affect.  LAB RESULTS: Recent Labs    04/11/20 1621 04/11/20 2115 04/12/20 0201  WBC 12.8* 16.9* 16.8*  HGB 10.7* 10.8* 10.0*  HCT  31.9* 31.9* 28.5*  PLT 194 179 173   BMET Recent Labs    04/11/20 1621 04/12/20 0201  NA 133* 131*  K 3.9 3.8  CL 101 99  CO2 21* 23  GLUCOSE 148* 158*  BUN 22 20  CREATININE 1.06* 0.80  CALCIUM 9.2 8.0*   LFT Recent Labs    04/11/20 1621  PROT 7.2  ALBUMIN 3.8  AST 24  ALT 18  ALKPHOS 23*  BILITOT 0.9   PT/INR No results for input(s): LABPROT, INR in the last 72 hours.  STUDIES: CT ABDOMEN PELVIS W CONTRAST  Result Date: 04/11/2020 CLINICAL DATA:  Bright red blood per rectum EXAM: CT ABDOMEN AND PELVIS WITH CONTRAST TECHNIQUE: Multidetector CT imaging of the abdomen and pelvis was performed using the standard protocol following bolus administration of intravenous contrast. CONTRAST:  123m OMNIPAQUE IOHEXOL 300 MG/ML  SOLN COMPARISON:  None. FINDINGS: Lower chest: No acute abnormality. Hepatobiliary: Liver is mildly fatty infiltrated. Gallbladder is well distended with dependent gallstones within. One of these appears lodged in the neck although no wall thickening or pericholecystic fluid is seen. Pancreas: Unremarkable. No pancreatic ductal dilatation or surrounding inflammatory changes. Spleen: Normal in size without focal abnormality.  Adrenals/Urinary Tract: Adrenal glands are within normal limits. Kidneys demonstrate a normal enhancement pattern bilaterally. No renal calculi or obstructive changes are seen. Normal excretion is noted on delayed images. The bladder is well distended. Stomach/Bowel: Colon is predominately decompressed with some mild edema particularly in the sigmoid and rectum consistent with focal colitis. Mild perirectal inflammatory changes are noted as well. No focal pooling of contrast is noted to suggest active extravasation. Prominent hemorrhoids are noted in the rectum. Small bowel and stomach appear within normal limits. Vascular/Lymphatic: Aortic atherosclerosis. No enlarged abdominal or pelvic lymph nodes. Reproductive: Status post hysterectomy. No adnexal masses. Other: No abdominal wall hernia or abnormality. No abdominopelvic ascites. Musculoskeletal: Degenerative changes of lumbar spine are noted. IMPRESSION: Inflammatory changes in the sigmoid and rectum consistent with focal colitis. No definitive areas of active hemorrhage are seen. Some prominent hemorrhoids are noted in the region of the rectum. Cholelithiasis with a gallstone within the neck. No wall thickening or pericholecystic fluid is noted. Electronically Signed   By: MInez CatalinaM.D.   On: 04/11/2020 22:33      Impression / Plan:   Sydney PLAIAis a 86y.o. y/o female with history of paroxysmal atrial fibrillation on Xarelto presented to the hospital with abdominal pain and melena.  No elevation in the BUN/creatinine ratio.  CT scan of the abdomen demonstrated colitis involving the sigmoid colon and the rectum.  Symptoms are acute in nature suggesting an infectious etiology- likely food poisoning from pre formed toxin ? Staph aureus vs Ecoli .  No stool studies have been sent.No diarrhea presently   Plan 1.  Continue conservative management with IV fluids.  If having active bleeding further evaluation would be required if not having active  bleeding would suggest watch and wait.  No urgent indication for colonoscopy.  Xarelto has been held.  Continue PPI.  2. Advance diet as tolerated if not having any more visible bleeding.   Thank you for involving me in the care of this patient.      LOS: 1 day   KJonathon Bellows MD  04/12/2020, 8:47 AM

## 2020-04-12 NOTE — H&P (Signed)
History and Physical    Sydney Mcdaniel:[id number] DOB: Feb 16, 1925 DOA: 04/11/2020  PCP: Delsa Grana, PA-C   Patient coming from: Home  I have personally briefly reviewed patient's old medical records in [place]  Chief Complaint: Abdominal pain, bloody diarrhea  HPI: Sydney Mcdaniel is a 86 y.o. female with medical history significant for paroxysmal A. fib on Xarelto, hypothyroidism, glaucoma, hypertension , who presents to the emergency room with a  onset of nausea vomiting, lower abdominal pain and bloody diarrhea that started after lunch at a restaurant.  The abdominal pain is of moderate intensity colicky and crampy with no aggravating or alleviating factors.  She has tried nothing for the pain.  She denies fever or chills.  No affected contacts ED Course: On arrival, she was afebrile and hemodynamically stable with BP 134/88 and pulse 64.  O2 sat 99% on room air hemoglobin on arrival was 10.7 and remained stable at 10.8 when repeated 5 hours later.  WBC elevated at 12,000-16,000 on second set.  Only minor electrolyte abnormalities.  CT abdomen and pelvis with contrast showed inflammatory changes in the sigmoid and rectum consistent with focal colitis.  No definitive areas of active hemorrhage seen some prominent hemorrhoids seen.  While in the ER patient had a large melanotic stool.  Patient was started on IV Protonix bolus then infusion, given IV Zosyn.  Hospitalist consulted for admission.   Review of Systems: As per HPI otherwise all other systems on review of systems negative.    Past Medical History:  Diagnosis Date  . Allergy   . Anemia   . Cataract   . CLL (chronic lymphocytic leukemia) ([place])   . Glaucoma   . H/O: hysterectomy    Total  . Hyperlipidemia   . Hypertension   . Hypothyroidism   . Impaired fasting glucose   . Lichen sclerosus   . Osteoporosis    Hips  . PAF (paroxysmal atrial fibrillation) ([place])    a. 06/15/2016 Event monitor: 4% afib  burden; b. CHA2DS2VASc - 4-->Xarelto.  . Syncope    a. 03/2016 Echo: EF55-60%, no rwma, mild AI/MR, nl PASP; b. 03/2016 48h Holter: no significant arrhythmias/pauses; c. 03/2016 MV: mild apical defect, likely breast attenuation, nl EF, low risk; d. 05/2016 Event monitor: No significant arrhythmia; e. 05/2016 Event monitor: PAF (4%).    Past Surgical History:  Procedure Laterality Date  . APPENDECTOMY    . EYE SURGERY     Glaucoma  . TOTAL ABDOMINAL HYSTERECTOMY       reports that she has never smoked. She has never used smokeless tobacco. She reports that she does not drink alcohol and does not use drugs.  Allergies  Allergen Reactions  . Other   . Alphagan [Brimonidine] Itching and Rash  . Pravachol [Pravastatin Sodium] Itching and Rash  . Pravastatin Itching, Other (See Comments) and Rash    Family History  Problem Relation Age of Onset  . Heart attack Mother   . Glaucoma Mother   . Heart attack Father   . Parkinson's disease Brother   . Heart attack Brother       Prior to Admission medications   Medication Sig Start Date End Date Taking? Authorizing Provider  alendronate (FOSAMAX) 70 MG tablet TAKE ONE TABLET BY MOUTH EVERY 7 DAYS. TAKE WITH A FULL GLASS OF WATER ON AN EMPTY STOMACH. Patient taking differently: Take 70 mg by mouth every Friday. TAKE ONE TABLET BY MOUTH EVERY 7 DAYS. TAKE WITH A  FULL GLASS OF WATER ON AN EMPTY STOMACH. 06/08/20  Yes Delsa Grana, PA-C  amLODipine (NORVASC) 5 MG tablet Take 1 tablet (5 mg total) by mouth daily. 10/28/19  Yes End, Harrell Gave, MD  atorvastatin (LIPITOR) 20 MG tablet Take 1 tablet (20 mg total) by mouth at bedtime. 04/08/20  Yes Delsa Grana, PA-C  Calcium Carb-Cholecalciferol (CALCIUM 600 + D PO) Take 1 tablet by mouth daily.    Yes [provider]  cholecalciferol (VITAMIN D) 1000 units tablet Take 1,000 Units by mouth daily.   Yes [provider]  Cyanocobalamin (B-12) 1000 MCG/ML KIT Inject 1,000 mcg as  directed every 30 (thirty) days.   Yes [provider]  ferrous sulfate 325 (65 FE) MG tablet Take 325 mg by mouth 2 (two) times daily with a meal.   Yes [provider]  latanoprost (XALATAN) 0.005 % ophthalmic solution Place 1 drop into the right eye at bedtime.  04/24/15  Yes [provider]  levothyroxine (EUTHYROX) 75 MCG tablet TAKE ONE TABLET BY MOUTH ON MONDAYS, [place], FRIDAYS, AND SUNDAYS Patient taking differently: Take 75 mcg by mouth as directed. TAKE ONE TABLET BY MOUTH ON MONDAYS, WEDNESDAYS, FRIDAYS, AND SUNDAYS 04/08/20  Yes Delsa Grana, PA-C  levothyroxine (EUTHYROX) 88 MCG tablet TAKE 1 TABLET BY MOUTH ON TUESDAYS, THURSDAYS, AND SATURDAYS Patient taking differently: Take 88 mcg by mouth as directed. TAKE 1 TABLET BY MOUTH ON TUESDAYS, THURSDAYS, AND SATURDAYS 04/08/20  Yes Delsa Grana, PA-C  lisinopril (ZESTRIL) 20 MG tablet Take 20 mg by mouth daily.   Yes [provider]  Multiple Vitamins-Minerals (MULTIVITAMIN ADULT PO) Take by mouth.   Yes [provider]  Omega-3 Fatty Acids (FISH OIL) 1200 MG CAPS Take 1,200 mg by mouth daily.    Yes [provider]  rivaroxaban (XARELTO) 20 MG TABS tablet Take 1 tablet (20 mg total) by mouth daily with supper. 07/16/19  Yes End, Harrell Gave, MD  timolol (TIMOPTIC) 0.5 % ophthalmic solution Place 1 drop into the right eye 2 (two) times daily.  01/01/20  Yes [provider]    Physical Exam: Vitals:   04/11/20 1616 04/11/20 1617 04/11/20 2056 04/11/20 2328  BP: (!) 136/98  (!) 157/98 (!) 143/51  Pulse: (!) 58  81 82  Resp: _0 Temp: 98 F (36.7 C)     TempSrc: Oral     SpO2: 100%  99% 97%  Weight:  65 kg    Height:  _1  (1.6 m)       Vitals:   04/11/20 1616 04/11/20 1617 04/11/20 2056 04/11/20 2328  BP: (!) 136/98  (!) 157/98 (!) 143/51  Pulse: (!) 58  81 82  Resp: _2 Temp: 98 F (36.7 C)     TempSrc: Oral     SpO2: 100%  99% 97%  Weight:   65 kg    Height:  _3  (1.6 m)        Constitutional: Alert and oriented x 3 . Not in any apparent distress HEENT:      Head: Normocephalic and atraumatic.         Eyes: PERLA, EOMI, Conjunctivae are normal. Sclera is non-icteric.       Mouth/Throat: Mucous membranes are moist.       Neck: Supple with no signs of meningismus. Cardiovascular: Regular rate and rhythm. No murmurs, gallops, or rubs. 2+ symmetrical distal pulses are present . No JVD. No LE edema Respiratory: Respiratory  effort normal .Lungs sounds clear bilaterally. No wheezes, crackles, or rhonchi.  Gastrointestinal:  Mild tenderness in the lower abdomen on palpation.  And non distended with positive bowel sounds. No rebound or guarding. Genitourinary: No CVA tenderness. Musculoskeletal: Nontender with normal range of motion in all extremities. No cyanosis, or erythema of extremities. Neurologic:  Face is symmetric. Moving all extremities. No gross focal neurologic deficits . Skin: Skin is warm, dry.  No rash or ulcers Psychiatric: Mood and affect are normal    Labs on Admission: I have personally reviewed following labs and imaging studies  CBC: Recent Labs  Lab 04/11/20 1621 04/11/20 2115  WBC 12.8* 16.9*  NEUTROABS  --  11.7*  HGB 10.7* 10.8*  HCT 31.9* 31.9*  MCV 92.2 92.2  PLT 194 315   Basic Metabolic Panel: Recent Labs  Lab 04/11/20 1621  NA 133*  K 3.9  CL 101  CO2 21*  GLUCOSE 148*  BUN 22  CREATININE 1.06*  CALCIUM 9.2   GFR: Estimated Creatinine Clearance: 30.7 mL/min (A) (by C-G formula based on SCr of 1.06 mg/dL (H)). Liver Function Tests: Recent Labs  Lab 04/11/20 1621  AST 24  ALT 18  ALKPHOS 23*  BILITOT 0.9  PROT 7.2  ALBUMIN 3.8   Recent Labs  Lab 04/11/20 1621  LIPASE 42   No results for input(s): AMMONIA in the last 168 hours. Coagulation Profile: No results for input(s): INR, PROTIME in the last 168 hours. Cardiac Enzymes: No results for input(s): CKTOTAL,  CKMB, CKMBINDEX, TROPONINI in the last 168 hours. BNP (last 3 results) No results for input(s): PROBNP in the last 8760 hours. HbA1C: No results for input(s): HGBA1C in the last 72 hours. CBG: No results for input(s): GLUCAP in the last 168 hours. Lipid Profile: No results for input(s): CHOL, HDL, LDLCALC, TRIG, CHOLHDL, LDLDIRECT in the last 72 hours. Thyroid Function Tests: No results for input(s): TSH, T4TOTAL, FREET4, T3FREE, THYROIDAB in the last 72 hours. Anemia Panel: No results for input(s): VITAMINB12, FOLATE, FERRITIN, TIBC, IRON, RETICCTPCT in the last 72 hours. Urine analysis:    Component Value Date/Time   COLORURINE YELLOW (A) 10/02/2016 1727   APPEARANCEUR HAZY (A) 10/02/2016 1727   LABSPEC 1.005 10/02/2016 1727   PHURINE 7.0 10/02/2016 1727   GLUCOSEU NEGATIVE 10/02/2016 1727   HGBUR NEGATIVE 10/02/2016 1727   BILIRUBINUR NEGATIVE 10/02/2016 1727   BILIRUBINUR neg 02/25/2016 1013   KETONESUR NEGATIVE 10/02/2016 1727   PROTEINUR NEGATIVE 10/02/2016 1727   UROBILINOGEN 0.2 02/25/2016 1013   NITRITE NEGATIVE 10/02/2016 1727   LEUKOCYTESUR SMALL (A) 10/02/2016 1727    Radiological Exams on Admission: CT ABDOMEN PELVIS W CONTRAST  Result Date: 04/11/2020 CLINICAL DATA:  Bright red blood per rectum EXAM: CT ABDOMEN AND PELVIS WITH CONTRAST TECHNIQUE: Multidetector CT imaging of the abdomen and pelvis was performed using the standard protocol following bolus administration of intravenous contrast. CONTRAST:  141m OMNIPAQUE IOHEXOL 300 MG/ML  SOLN COMPARISON:  None. FINDINGS: Lower chest: No acute abnormality. Hepatobiliary: Liver is mildly fatty infiltrated. Gallbladder is well distended with dependent gallstones within. One of these appears lodged in the neck although no wall thickening or pericholecystic fluid is seen. Pancreas: Unremarkable. No pancreatic ductal dilatation or surrounding inflammatory changes. Spleen: Normal in size without focal abnormality.  Adrenals/Urinary Tract: Adrenal glands are within normal limits. Kidneys demonstrate a normal enhancement pattern bilaterally. No renal calculi or obstructive changes are seen. Normal excretion is noted on delayed images. The bladder is well distended. Stomach/Bowel: Colon  is predominately decompressed with some mild edema particularly in the sigmoid and rectum consistent with focal colitis. Mild perirectal inflammatory changes are noted as well. No focal pooling of contrast is noted to suggest active extravasation. Prominent hemorrhoids are noted in the rectum. Small bowel and stomach appear within normal limits. Vascular/Lymphatic: Aortic atherosclerosis. No enlarged abdominal or pelvic lymph nodes. Reproductive: Status post hysterectomy. No adnexal masses. Other: No abdominal wall hernia or abnormality. No abdominopelvic ascites. Musculoskeletal: Degenerative changes of lumbar spine are noted. IMPRESSION: Inflammatory changes in the sigmoid and rectum consistent with focal colitis. No definitive areas of active hemorrhage are seen. Some prominent hemorrhoids are noted in the region of the rectum. Cholelithiasis with a gallstone within the neck. No wall thickening or pericholecystic fluid is noted. Electronically Signed   By: Inez Catalina M.D.   On: 04/11/2020 22:33     Assessment/Plan 86 year old female with history of paroxysmal A. fib on Xarelto, hypothyroidism, glaucoma, hypertension presenting with bloody diarrhea, nausea vomiting.    Acute GI bleed(melena and bright red blood) Focal colitis on CT -Patient on Xarelto presenting with bloody diarrhea as well as melena with focal colitis seen on CT  suggesting both upper and lower -N.p.o. -Continue Protonix infusion -Serial H&H and transfuse if necessary -GI consult -IV fluids, IV antiemetics, IV pain meds and supportive care    Essential hypertension -Hold home oral antihypertensives.  Patient presently normotensive -As needed IV labetalol for  BP 160/90    Hypothyroidism -Hold home levothyroxine for now    Chronic diastolic congestive heart failure ([place]) -Not acutely exacerbated -Monitor for fluid overload in view of IV hydration above    Paroxysmal atrial fibrillation (HCC) -Holding home Xarelto    DVT prophylaxis: SCDs Code Status: full code  Family Communication: Son at bedside Disposition Plan: Back to previous home environment Consults called: GI Status:At the time of admission, it appears that the appropriate admission status for this patient is INPATIENT. This is judged to be reasonable and necessary in order to provide the required intensity of service to ensure the patient's safety given the presenting symptoms, physical exam findings, and initial radiographic and laboratory data in the context of their  Comorbid conditions.   Patient requires inpatient status due to high intensity of service, high risk for further deterioration and high frequency of surveillance required.   I certify that at the point of admission it is my clinical judgment that the patient will require inpatient hospital care spanning beyond [place] MD [facility]     04/12/2020, 12:01 AM

## 2020-04-13 DIAGNOSIS — K529 Noninfective gastroenteritis and colitis, unspecified: Secondary | ICD-10-CM

## 2020-04-13 LAB — TYPE AND SCREEN
ABO/RH(D): A POS
Antibody Screen: POSITIVE
DAT, IgG: POSITIVE
DAT, complement: NEGATIVE
Unit division: 0
Unit division: 0

## 2020-04-13 LAB — CBC
HCT: 26.1 % — ABNORMAL LOW (ref 36.0–46.0)
Hemoglobin: 9.2 g/dL — ABNORMAL LOW (ref 12.0–15.0)
MCH: 32.9 pg (ref 26.0–34.0)
MCHC: 35.2 g/dL (ref 30.0–36.0)
MCV: 93.2 fL (ref 80.0–100.0)
Platelets: 144 10*3/uL — ABNORMAL LOW (ref 150–400)
RBC: 2.8 MIL/uL — ABNORMAL LOW (ref 3.87–5.11)
RDW: 14.3 % (ref 11.5–15.5)
WBC: 13.3 10*3/uL — ABNORMAL HIGH (ref 4.0–10.5)
nRBC: 0 % (ref 0.0–0.2)

## 2020-04-13 LAB — BASIC METABOLIC PANEL
Anion gap: 8 (ref 5–15)
BUN: 19 mg/dL (ref 8–23)
CO2: 21 mmol/L — ABNORMAL LOW (ref 22–32)
Calcium: 8.3 mg/dL — ABNORMAL LOW (ref 8.9–10.3)
Chloride: 103 mmol/L (ref 98–111)
Creatinine, Ser: 1 mg/dL (ref 0.44–1.00)
GFR, Estimated: 49 mL/min — ABNORMAL LOW (ref >60)
Glucose, Bld: 115 mg/dL — ABNORMAL HIGH (ref 70–99)
Potassium: 3.7 mmol/L (ref 3.5–5.1)
Sodium: 132 mmol/L — ABNORMAL LOW (ref 135–145)

## 2020-04-13 LAB — BPAM RBC
Blood Product Expiration Date: 202110262359
Blood Product Expiration Date: 202110282359
Unit Type and Rh: 600
Unit Type and Rh: 600

## 2020-04-13 LAB — MAGNESIUM: Magnesium: 2 mg/dL (ref 1.7–2.4)

## 2020-04-13 MED ORDER — CIPROFLOXACIN HCL 500 MG PO TABS: 500.0000 mg | ORAL_TABLET | Freq: Two times a day (BID) | ORAL | 0 refills | Status: AC

## 2020-04-13 MED ORDER — RIVAROXABAN 20 MG PO TABS
20.0000 mg | ORAL_TABLET | Freq: Every day | ORAL | 2 refills | Status: DC
Start: 2020-04-15 — End: 2020-05-19

## 2020-04-13 NOTE — Progress Notes (Signed)
Jonathon Bellows , MD [address] [place], [place], [place], [address] [address], [place], [place], [place], [address] Phone: [phone]  Fax: [phone]   Sydney Mcdaniel is being followed for food posioning  Day 2 of follow up   Subjective:  Doing well no abdominal pain , eating and drinking well    Objective: Vital signs in last 24 hours: Vitals:   04/13/20 0229 04/13/20 0256 04/13/20 0801 04/13/20 1159  BP: (!) 111/46  (!) 135/52 138/88  Pulse: 64  67 64  Resp: 18  18 18   Temp: 100.3 F (37.9 C) 99 F (37.2 C) 98.6 F (37 C) 98.5 F (36.9 C)  TempSrc: Oral Oral Oral Oral  SpO2: 99%  98% 98%  Weight:  64.7 kg    Height:       Weight change: 0.182 kg  Intake/Output Summary (Last 24 hours) at 04/13/2020 1302 Last data filed at 04/13/2020 1150 Gross per 24 hour  Intake 1062.88 ml  Output 1575 ml  Net -512.12 ml     Exam:  Abdomen: soft, nontender, normal bowel sounds   Lab Results: @LABTEST2 @ Micro Results: Recent Results (from the past 240 hour(s))  Respiratory Panel by RT PCR (Flu A&B, Covid) - Nasopharyngeal Swab     Status: None   Collection Time: 04/11/20 10:51 PM   Specimen: Nasopharyngeal Swab  Result Value Ref Range Status   SARS Coronavirus 2 by RT PCR NEGATIVE NEGATIVE Final    Comment: (NOTE) SARS-CoV-2 target nucleic acids are NOT DETECTED.  The SARS-CoV-2 RNA is generally detectable in upper respiratoy specimens during the acute phase of infection. The lowest concentration of SARS-CoV-2 viral copies this assay can detect is 131 copies/mL. A negative result does not preclude SARS-Cov-2 infection and should not be used as the sole basis for treatment or other patient management decisions. A negative result may occur with  improper specimen collection/handling, submission of specimen other than nasopharyngeal swab, presence of viral mutation(s) within the areas targeted by this assay, and inadequate number of viral copies (<131  copies/mL). A negative result must be combined with clinical observations, patient history, and epidemiological information. The expected result is Negative.  Fact Sheet for Patients:  PinkCheek.be  Fact Sheet for Healthcare Providers:  GravelBags.it  This test is no t yet approved or cleared by the [place] FDA and  has been authorized for detection and/or diagnosis of SARS-CoV-2 by FDA under an Emergency Use Authorization (EUA). This EUA will remain  in effect (meaning this test can be used) for the duration of the COVID-19 declaration under Section 564(b)(1) of the Act, 21 U.S.C. section 360bbb-3(b)(1), unless the authorization is terminated or revoked sooner.     Influenza A by PCR NEGATIVE NEGATIVE Final   Influenza B by PCR NEGATIVE NEGATIVE Final    Comment: (NOTE) The Xpert Xpress SARS-CoV-2/FLU/RSV assay is intended as an aid in  the diagnosis of influenza from Nasopharyngeal swab specimens and  should not be used as a sole basis for treatment. Nasal washings and  aspirates are unacceptable for Xpert Xpress SARS-CoV-2/FLU/RSV  testing.  Fact Sheet for Patients: PinkCheek.be  Fact Sheet for Healthcare Providers: GravelBags.it  This test is not yet approved or cleared by the [place] FDA and  has been authorized for detection and/or diagnosis of SARS-CoV-2 by  FDA under an Emergency Use Authorization (EUA). This EUA will remain  in effect (meaning this test can be used) for the duration of the  Covid-19 declaration  under Section 564(b)(1) of the Act, 21  U.S.C. section 360bbb-3(b)(1), unless the authorization is  terminated or revoked. Performed at [facility], [address] [place], [place] [address]    Studies/Results: CT ABDOMEN PELVIS W CONTRAST  Result Date: 04/11/2020 CLINICAL DATA:  Bright red blood per rectum EXAM: CT  ABDOMEN AND PELVIS WITH CONTRAST TECHNIQUE: Multidetector CT imaging of the abdomen and pelvis was performed using the standard protocol following bolus administration of intravenous contrast. CONTRAST:  146mL OMNIPAQUE IOHEXOL 300 MG/ML  SOLN COMPARISON:  None. FINDINGS: Lower chest: No acute abnormality. Hepatobiliary: Liver is mildly fatty infiltrated. Gallbladder is well distended with dependent gallstones within. One of these appears lodged in the neck although no wall thickening or pericholecystic fluid is seen. Pancreas: Unremarkable. No pancreatic ductal dilatation or surrounding inflammatory changes. Spleen: Normal in size without focal abnormality. Adrenals/Urinary Tract: Adrenal glands are within normal limits. Kidneys demonstrate a normal enhancement pattern bilaterally. No renal calculi or obstructive changes are seen. Normal excretion is noted on delayed images. The bladder is well distended. Stomach/Bowel: Colon is predominately decompressed with some mild edema particularly in the sigmoid and rectum consistent with focal colitis. Mild perirectal inflammatory changes are noted as well. No focal pooling of contrast is noted to suggest active extravasation. Prominent hemorrhoids are noted in the rectum. Small bowel and stomach appear within normal limits. Vascular/Lymphatic: Aortic atherosclerosis. No enlarged abdominal or pelvic lymph nodes. Reproductive: Status post hysterectomy. No adnexal masses. Other: No abdominal wall hernia or abnormality. No abdominopelvic ascites. Musculoskeletal: Degenerative changes of lumbar spine are noted. IMPRESSION: Inflammatory changes in the sigmoid and rectum consistent with focal colitis. No definitive areas of active hemorrhage are seen. Some prominent hemorrhoids are noted in the region of the rectum. Cholelithiasis with a gallstone within the neck. No wall thickening or pericholecystic fluid is noted. Electronically Signed   By: Inez Catalina M.D.   On: 04/11/2020  22:33   Medications: I have reviewed the patient's current medications. Scheduled Meds:  amLODipine  5 mg Oral Daily   atorvastatin  20 mg Oral QHS   ciprofloxacin  500 mg Oral BID   levothyroxine  88 mcg Oral Q0600   lisinopril  20 mg Oral Daily   timolol  1 drop Right Eye BID   Continuous Infusions: PRN Meds:.acetaminophen **OR** acetaminophen, iohexol, ondansetron **OR** ondansetron (ZOFRAN) IV   Assessment: Principal Problem:   Acute colitis Active Problems:   Essential hypertension   Hypothyroidism   Chronic diastolic congestive heart failure (HCC)   Paroxysmal atrial fibrillation (HCC)   GI bleeding   Sydney Mcdaniel is a 86 y.o. y/o female with history of paroxysmal atrial fibrillation on Xarelto presented to the hospital with abdominal pain and melena.  No elevation in the BUN/creatinine ratio.  CT scan of the abdomen demonstrated colitis involving the sigmoid colon and the rectum.  Symptoms are acute in nature suggesting an infectious etiology- likely food poisoning from pre formed toxin- clinically has resolved today   Plan 1.  Agree with holding xarelto for 2-3 days and then resume 2. Outpatient virtual visit with me in a few weeks to ensure she is doing well.     LOS: 2 days   Jonathon Bellows, MD 04/13/2020, 1:02 PM

## 2020-04-13 NOTE — Progress Notes (Signed)
Mobility Specialist - Progress Note   04/13/20 1100  Mobility  Activity Transferred:  Bed to chair  Range of Motion/Exercises Right leg;Left leg (ankle pumps, seated march, heel slides, slr, hip add/abd)  Level of Assistance Minimal assist, patient does 75% or more  Assistive Device [employer] Ambulated (ft) 3 ft  Mobility Response Tolerated well  Mobility performed by Mobility specialist  $Mobility charge 1 Mobility    Pre-mobility: 67 HR, 97% SpO2 During mobility: 75 HR, 98% SpO2 Post-mobility: 71 HR, 97% SpO2   Pt was lying in bed upon arrival utilizing room air with daughter present upon arrival. Pt agreed to session. Pt denied any pain, nausea, or fatigue. Pt was requesting to sit up in chair. Pt was minA getting EOB where she performed seated exercises: ankle pumps, slr, and seated march. Pt was minA in STS d/t pt's feet not completely touching floor even with bed at lowest height. Pt was able to ambulate 3' to chair with SBA, CGA used for safety precautions. VC was needed for hand placement to sit. Once in recliner, pt was able to complete exercises: heel slides, hip isometrics, and hip add/abd. Overall, pt tolerated session well. Pt was left in recliner with alarm set and all needs in reach.    Kathee Delton Mobility Specialist 04/13/20, 12:02 PM

## 2020-04-13 NOTE — Plan of Care (Signed)
Patient discharged at this time via w/c by volunteer transportation services accompanied with daughter to home with all belongings. PIV/Tele/Purewick removed by Probation officer. Patient tolerated well. Daughter understood discharge instructions/teachings. VSS. NAD noted.     Problem: Education: Goal: Ability to identify signs and symptoms of gastrointestinal bleeding will improve Outcome: Completed/Met   Problem: Bowel/Gastric: Goal: Will show no signs and symptoms of gastrointestinal bleeding Outcome: Completed/Met   Problem: Fluid Volume: Goal: Will show no signs and symptoms of excessive bleeding Outcome: Completed/Met   Problem: Clinical Measurements: Goal: Complications related to the disease process, condition or treatment will be avoided or minimized Outcome: Completed/Met

## 2020-04-13 NOTE — Discharge Summary (Signed)
Physician Discharge Summary   Sydney Mcdaniel  female DOB: July 20, 1924  WUJ:[id number]  PCP: Delsa Grana, PA-C  Admit date: 04/11/2020 Discharge date: 04/13/2020  Admitted From: home Disposition:  Home Daughter updated prior to discharge CODE STATUS: Full code  Discharge Instructions    Discharge instructions   Complete by: As directed    Your bloody stool has stopped and your hemoglobin is stable, so you can go home to recover.    You have been receiving antibiotic for inflammation seen on the imaging, to treat for possible bacterial infection in your colon.  Please continue 4 more days of Cipro at home.  Please follow up with your primary care doctor within a week after discharge to check your blood level.  Resume your Xarelto 2 days after discharge.   Dr. Enzo Bi [facility] Course:  For full details, please see H&P, progress notes, consult notes and ancillary notes.  Briefly,  Sydney Mcdaniel is a 86 y.o. female with medical history significant for paroxysmal A. fib on Xarelto, hypothyroidism, glaucoma, hypertension, who presented to the emergency room with sudden onset of nausea vomiting, lower abdominal pain and bloody diarrhea that started after lunch at a restaurant.    # N/V/D and abdominal pain likely due to food poisoning  # Acute GI bleed (melena and bright red blood) # Focal colitis on CT Patient on Xarelto presenting with bloody diarrhea as well as melena with focal colitis seen on CT involving the sigmoid colon and the rectum.  Hgb stable in 9's-10's which is baseline.  GI consulted and believed pt likely had food poisoning which caused N/V and diarrhea and colitis.  Due to pt being on anticoagulation, there may have been small amount of bleeding that turned into visible GI bleed.  Diarrhea resolved after presentation.  Xarelto was held and pt was instructed to resume 2 days after discharge.  Pt did have leukocytosis WBC 12.8 on presentation, and  imaging finding of colitis, so pt was started on zosyn and received 2 days before discharging on 4 more days of Cipro.    Essential hypertension home oral antihypertensives held during hospitalization and resumed after discharge.    Hypothyroidism Continued levothyroxine    Chronic diastolic congestive heart failure [facility]) Not acutely exacerbated    Paroxysmal atrial fibrillation ([place]) Home Xarelto held on presentation, and will resume 2 days after discharge.   Discharge Diagnoses:  Principal Problem:   Acute colitis Active Problems:   Essential hypertension   Hypothyroidism   Chronic diastolic congestive heart failure (HCC)   Paroxysmal atrial fibrillation (HCC)   GI bleeding    Discharge Instructions:  Allergies as of 04/13/2020      Reactions   Other    Alphagan [brimonidine] Itching, Rash   Pravachol [pravastatin Sodium] Itching, Rash   Pravastatin Itching, Other (See Comments), Rash      Medication List    TAKE these medications   alendronate 70 MG tablet Commonly known as: FOSAMAX TAKE ONE TABLET BY MOUTH EVERY 7 DAYS. TAKE WITH A FULL GLASS OF WATER ON AN EMPTY STOMACH. Start taking on: June 08, 2020 What changed:   how much to take  how to take this  when to take this   amLODipine 5 MG tablet Commonly known as: NORVASC Take 1 tablet (5 mg total) by mouth daily.   atorvastatin 20 MG tablet Commonly known as: LIPITOR Take 1 tablet (20 mg total) by mouth at  bedtime.   B-12 1000 MCG/ML Kit Inject 1,000 mcg as directed every 30 (thirty) days.   CALCIUM 600 + D PO Take 1 tablet by mouth daily.   cholecalciferol 1000 units tablet Commonly known as: VITAMIN D Take 1,000 Units by mouth daily.   ciprofloxacin 500 MG tablet Commonly known as: CIPRO Take 1 tablet (500 mg total) by mouth 2 (two) times daily for 4 days.   ferrous sulfate 325 (65 FE) MG tablet Take 325 mg by mouth 2 (two) times daily with a meal.   Fish Oil 1200 MG  Caps Take 1,200 mg by mouth daily.   latanoprost 0.005 % ophthalmic solution Commonly known as: XALATAN Place 1 drop into the right eye at bedtime.   levothyroxine 75 MCG tablet Commonly known as: Euthyrox TAKE ONE TABLET BY MOUTH ON MONDAYS, WEDNESDAYS, FRIDAYS, AND SUNDAYS What changed:   how much to take  how to take this  when to take this   levothyroxine 88 MCG tablet Commonly known as: Euthyrox TAKE 1 TABLET BY MOUTH ON TUESDAYS, THURSDAYS, AND SATURDAYS What changed:   how much to take  how to take this  when to take this   lisinopril 20 MG tablet Commonly known as: ZESTRIL Take 20 mg by mouth daily.   MULTIVITAMIN ADULT PO Take by mouth.   rivaroxaban 20 MG Tabs tablet Commonly known as: XARELTO Take 1 tablet (20 mg total) by mouth daily with supper. Start taking on: April 15, 2020 What changed: These instructions start on April 15, 2020. If you are unsure what to do until then, ask your doctor or other care provider.   timolol 0.5 % ophthalmic solution Commonly known as: TIMOPTIC Place 1 drop into the right eye 2 (two) times daily.        Follow-up Information    Delsa Grana, PA-C. Schedule an appointment as soon as possible for a visit in 1 week(s).   Specialty: Family Medicine Contact information: [address] [place] [address] [phone]        Nelva Bush, MD .   Specialty: Cardiology Contact information: [address] [place] [address] [phone]               Allergies  Allergen Reactions  . Other   . Alphagan [Brimonidine] Itching and Rash  . Pravachol [Pravastatin Sodium] Itching and Rash  . Pravastatin Itching, Other (See Comments) and Rash     The results of significant diagnostics from this hospitalization (including imaging, microbiology, ancillary and laboratory) are listed below for reference.   Consultations:   Procedures/Studies: CT ABDOMEN PELVIS W  CONTRAST  Result Date: 04/11/2020 CLINICAL DATA:  Bright red blood per rectum EXAM: CT ABDOMEN AND PELVIS WITH CONTRAST TECHNIQUE: Multidetector CT imaging of the abdomen and pelvis was performed using the standard protocol following bolus administration of intravenous contrast. CONTRAST:  111m OMNIPAQUE IOHEXOL 300 MG/ML  SOLN COMPARISON:  None. FINDINGS: Lower chest: No acute abnormality. Hepatobiliary: Liver is mildly fatty infiltrated. Gallbladder is well distended with dependent gallstones within. One of these appears lodged in the neck although no wall thickening or pericholecystic fluid is seen. Pancreas: Unremarkable. No pancreatic ductal dilatation or surrounding inflammatory changes. Spleen: Normal in size without focal abnormality. Adrenals/Urinary Tract: Adrenal glands are within normal limits. Kidneys demonstrate a normal enhancement pattern bilaterally. No renal calculi or obstructive changes are seen. Normal excretion is noted on delayed images. The bladder is well distended. Stomach/Bowel: Colon is predominately decompressed  with some mild edema particularly in the sigmoid and rectum consistent with focal colitis. Mild perirectal inflammatory changes are noted as well. No focal pooling of contrast is noted to suggest active extravasation. Prominent hemorrhoids are noted in the rectum. Small bowel and stomach appear within normal limits. Vascular/Lymphatic: Aortic atherosclerosis. No enlarged abdominal or pelvic lymph nodes. Reproductive: Status post hysterectomy. No adnexal masses. Other: No abdominal wall hernia or abnormality. No abdominopelvic ascites. Musculoskeletal: Degenerative changes of lumbar spine are noted. IMPRESSION: Inflammatory changes in the sigmoid and rectum consistent with focal colitis. No definitive areas of active hemorrhage are seen. Some prominent hemorrhoids are noted in the region of the rectum. Cholelithiasis with a gallstone within the neck. No wall thickening or  pericholecystic fluid is noted. Electronically Signed   By: Inez Catalina M.D.   On: 04/11/2020 22:33      Labs: BNP (last 3 results) No results for input(s): BNP in the last 8760 hours. Basic Metabolic Panel: Recent Labs  Lab 04/11/20 1621 04/12/20 0201 04/13/20 0559  NA 133* 131* 132*  K 3.9 3.8 3.7  CL 101 99 103  CO2 21* 23 21*  GLUCOSE 148* 158* 115*  BUN _0 CREATININE 1.06* 0.80 1.00  CALCIUM 9.2 8.0* 8.3*  MG  --   --  2.0   Liver Function Tests: Recent Labs  Lab 04/11/20 1621  AST 24  ALT 18  ALKPHOS 23*  BILITOT 0.9  PROT 7.2  ALBUMIN 3.8   Recent Labs  Lab 04/11/20 1621  LIPASE 42   No results for input(s): AMMONIA in the last 168 hours. CBC: Recent Labs  Lab 04/11/20 1621 04/11/20 2115 04/12/20 0201 04/13/20 0559  WBC 12.8* 16.9* 16.8* 13.3*  NEUTROABS  --  11.7*  --   --   HGB 10.7* 10.8* 10.0* 9.2*  HCT 31.9* 31.9* 28.5* 26.1*  MCV 92.2 92.2 90.8 93.2  PLT 194 179 173 144*   Cardiac Enzymes: No results for input(s): CKTOTAL, CKMB, CKMBINDEX, TROPONINI in the last 168 hours. BNP: Invalid input(s): POCBNP CBG: No results for input(s): GLUCAP in the last 168 hours. D-Dimer No results for input(s): DDIMER in the last 72 hours. Hgb A1c No results for input(s): HGBA1C in the last 72 hours. Lipid Profile No results for input(s): CHOL, HDL, LDLCALC, TRIG, CHOLHDL, LDLDIRECT in the last 72 hours. Thyroid function studies No results for input(s): TSH, T4TOTAL, T3FREE, THYROIDAB in the last 72 hours.  Invalid input(s): FREET3 Anemia work up No results for input(s): VITAMINB12, FOLATE, FERRITIN, TIBC, IRON, RETICCTPCT in the last 72 hours. Urinalysis    Component Value Date/Time   COLORURINE YELLOW (A) 10/02/2016 1727   APPEARANCEUR HAZY (A) 10/02/2016 1727   LABSPEC 1.005 10/02/2016 1727   PHURINE 7.0 10/02/2016 1727   GLUCOSEU NEGATIVE 10/02/2016 1727   HGBUR NEGATIVE 10/02/2016 1727   BILIRUBINUR NEGATIVE 10/02/2016 1727    BILIRUBINUR neg 02/25/2016 1013   KETONESUR NEGATIVE 10/02/2016 1727   PROTEINUR NEGATIVE 10/02/2016 1727   UROBILINOGEN 0.2 02/25/2016 1013   NITRITE NEGATIVE 10/02/2016 1727   LEUKOCYTESUR SMALL (A) 10/02/2016 1727   Sepsis Labs Invalid input(s): PROCALCITONIN,  WBC,  LACTICIDVEN Microbiology Recent Results (from the past 240 hour(s))  Respiratory Panel by RT PCR (Flu A&B, Covid) - Nasopharyngeal Swab     Status: None   Collection Time: 04/11/20 10:51 PM   Specimen: Nasopharyngeal Swab  Result Value Ref Range Status   SARS Coronavirus 2 by RT PCR NEGATIVE NEGATIVE Final  Comment: (NOTE) SARS-CoV-2 target nucleic acids are NOT DETECTED.  The SARS-CoV-2 RNA is generally detectable in upper respiratoy specimens during the acute phase of infection. The lowest concentration of SARS-CoV-2 viral copies this assay can detect is 131 copies/mL. A negative result does not preclude SARS-Cov-2 infection and should not be used as the sole basis for treatment or other patient management decisions. A negative result may occur with  improper specimen collection/handling, submission of specimen other than nasopharyngeal swab, presence of viral mutation(s) within the areas targeted by this assay, and inadequate number of viral copies (<131 copies/mL). A negative result must be combined with clinical observations, patient history, and epidemiological information. The expected result is Negative.  Fact Sheet for Patients:  PinkCheek.be  Fact Sheet for Healthcare Providers:  GravelBags.it  This test is no t yet approved or cleared by the [place] FDA and  has been authorized for detection and/or diagnosis of SARS-CoV-2 by FDA under an Emergency Use Authorization (EUA). This EUA will remain  in effect (meaning this test can be used) for the duration of the COVID-19 declaration under Section 564(b)(1) of the Act, 21 U.S.C. section  360bbb-3(b)(1), unless the authorization is terminated or revoked sooner.     Influenza A by PCR NEGATIVE NEGATIVE Final   Influenza B by PCR NEGATIVE NEGATIVE Final    Comment: (NOTE) The Xpert Xpress SARS-CoV-2/FLU/RSV assay is intended as an aid in  the diagnosis of influenza from Nasopharyngeal swab specimens and  should not be used as a sole basis for treatment. Nasal washings and  aspirates are unacceptable for Xpert Xpress SARS-CoV-2/FLU/RSV  testing.  Fact Sheet for Patients: PinkCheek.be  Fact Sheet for Healthcare Providers: GravelBags.it  This test is not yet approved or cleared by the [place] FDA and  has been authorized for detection and/or diagnosis of SARS-CoV-2 by  FDA under an Emergency Use Authorization (EUA). This EUA will remain  in effect (meaning this test can be used) for the duration of the  Covid-19 declaration under Section 564(b)(1) of the Act, 21  U.S.C. section 360bbb-3(b)(1), unless the authorization is  terminated or revoked. Performed at [facility], [address] [place], [place] [address]      Total time spend on discharging this patient, including the last patient exam, discussing the hospital stay, instructions for ongoing care as it relates to all pertinent caregivers, as well as preparing the medical discharge records, prescriptions, and/or referrals as applicable, is 40 minutes.    Enzo Bi, MD  [facility] 04/13/2020, 12:47 PM  If 7PM-7AM, please contact night-coverage

## 2020-04-14 ENCOUNTER — Telehealth: Payer: Self-pay

## 2020-04-14 ENCOUNTER — Ambulatory Visit: Payer: Medicare Other | Admitting: Physical Therapy

## 2020-04-14 NOTE — Telephone Encounter (Signed)
Transition Care Management Follow-up Telephone Call  Date of discharge and from where: 04/13/20 [facility]  How have you been since you were released from the hospital? Pt states she is feeling okay, just weak but improving.   Any questions or concerns? No  Items Reviewed:  Did the pt receive and understand the discharge instructions provided? Yes   Medications obtained and verified? Yes   Other? No   Any new allergies since your discharge? No   Dietary orders reviewed? Yes  Do you have support at home? Yes   Home Care and Equipment/Supplies: Were home health services ordered? no  Functional Questionnaire: (I = Independent and D = Dependent) ADLs: I  Bathing/Dressing- I  Meal Prep- I  Eating- I  Maintaining continence- I  Transferring/Ambulation- I  Managing Meds- I with assistance  Follow up appointments reviewed:   PCP Hospital f/u appt confirmed? Yes  Scheduled to see Delsa Grana PAC on 05/03/20 @ 11:20.  [facility] f/u appt confirmed? Yes  Scheduled to see cardiology on 04/20/20.  Are transportation arrangements needed? No   If their condition worsens, is the pt aware to call PCP or go to the Emergency Dept.? Yes  Was the patient provided with contact information for the PCP's office or ED? Yes  Was to pt encouraged to call back with questions or concerns? Yes

## 2020-04-16 ENCOUNTER — Encounter: Payer: Self-pay | Admitting: Family Medicine

## 2020-04-19 ENCOUNTER — Ambulatory Visit: Payer: Medicare Other | Admitting: Physical Therapy

## 2020-04-20 ENCOUNTER — Ambulatory Visit (INDEPENDENT_AMBULATORY_CARE_PROVIDER_SITE_OTHER): Payer: Medicare Other | Admitting: Physician Assistant

## 2020-04-20 ENCOUNTER — Encounter: Payer: Self-pay | Admitting: Physician Assistant

## 2020-04-20 ENCOUNTER — Other Ambulatory Visit: Payer: Self-pay

## 2020-04-20 VITALS — BP 164/62 | HR 67 | Ht 62.0 in | Wt 139.2 lb

## 2020-04-20 DIAGNOSIS — I5032 Chronic diastolic (congestive) heart failure: Secondary | ICD-10-CM

## 2020-04-20 DIAGNOSIS — E785 Hyperlipidemia, unspecified: Secondary | ICD-10-CM

## 2020-04-20 DIAGNOSIS — Z9181 History of falling: Secondary | ICD-10-CM | POA: Diagnosis not present

## 2020-04-20 DIAGNOSIS — R0989 Other specified symptoms and signs involving the circulatory and respiratory systems: Secondary | ICD-10-CM | POA: Diagnosis not present

## 2020-04-20 DIAGNOSIS — I480 Paroxysmal atrial fibrillation: Secondary | ICD-10-CM | POA: Diagnosis not present

## 2020-04-20 DIAGNOSIS — C9110 Chronic lymphocytic leukemia of B-cell type not having achieved remission: Secondary | ICD-10-CM | POA: Diagnosis not present

## 2020-04-20 DIAGNOSIS — Z8719 Personal history of other diseases of the digestive system: Secondary | ICD-10-CM | POA: Diagnosis not present

## 2020-04-20 DIAGNOSIS — I10 Essential (primary) hypertension: Secondary | ICD-10-CM | POA: Diagnosis not present

## 2020-04-20 DIAGNOSIS — I951 Orthostatic hypotension: Secondary | ICD-10-CM | POA: Diagnosis not present

## 2020-04-20 DIAGNOSIS — R55 Syncope and collapse: Secondary | ICD-10-CM

## 2020-04-20 NOTE — Progress Notes (Signed)
Office Visit    Patient Name: Sydney Mcdaniel Date of Encounter: 04/20/2020  Primary Care Provider:  Delsa Grana, PA-C Primary Cardiologist:  Nelva Bush, MD  Chief Complaint    Chief Complaint  Patient presents with  . other    Sydney Mcdaniel f/u; bleed. Meds reviewed by the pt. verbally. "doing well." Pt. c/o LE edema.      86 y.o. female with a hx of recurrent falls felt to be due to orthostatic hypotension and vasovagal syncope, paroxysmal atrial fibrillation, HFpEF, hypertension, CLL, vitamin B12 deficiency, anemia, and seen today for hospital follow-up after reported bleed.  Past Medical History    Past Medical History:  Diagnosis Date  . Allergy   . Anemia   . Cataract   . CLL (chronic lymphocytic leukemia) ([place])   . Glaucoma   . H/O: hysterectomy    Total  . Hyperlipidemia   . Hypertension   . Hypothyroidism   . Impaired fasting glucose   . Lichen sclerosus   . Osteoporosis    Hips  . PAF (paroxysmal atrial fibrillation) ([place])    a. 06/15/2016 Event monitor: 4% afib burden; b. CHA2DS2VASc - 4-->Xarelto.  . Syncope    a. 03/2016 Echo: EF55-60%, no rwma, mild AI/MR, nl PASP; b. 03/2016 48h Holter: no significant arrhythmias/pauses; c. 03/2016 MV: mild apical defect, likely breast attenuation, nl EF, low risk; d. 05/2016 Event monitor: No significant arrhythmia; e. 05/2016 Event monitor: PAF (4%).   Past Surgical History:  Procedure Laterality Date  . APPENDECTOMY    . EYE SURGERY     Glaucoma  . TOTAL ABDOMINAL HYSTERECTOMY      Allergies  Allergies  Allergen Reactions  . Other   . Alphagan [Brimonidine] Itching and Rash  . Pravachol [Pravastatin Sodium] Itching and Rash  . Pravastatin Itching, Other (See Comments) and Rash    History of Present Illness    Sydney Mcdaniel is a 86 y.o. female with PMH as above.  She has a history of syncope. She has noted episodes in the past during which she feels "funny" and like "looking through water." She has  also noted intermittent leg pain in the past (L>R). As a result, she took a break from her statin medication; however, the statin was restarted when it did not change her sx. When last seen in the office, she c/o L hip pain and swelling, which was ongoing. She was ambulating with a cane. No recent falls.  She was recently taken off atenolol due to bradycardia, that this was restarted due to worsening vision.  At her 06/2019 clinic visit, it was noted SBP was mildly elevated with readings 140-160s.  Lisinopril was increased to 104m daily.  She was seen at follow-up and doing relatively well with a single episode of lightheadedness 2 to 3 weeks earlier while watching television.  This resolved on its own.  Home BP was 130s -160s /45 -60s.  She knew recently noted hyponatremia and was encouraged to begin drinking body armor drinks to improve her sodium.  She reported chronic ankle swelling.  At this visit, lisinopril was increased to 20 mg daily with recommendation for repeat BMET 1 to 2 weeks.  It was recommended she discontinue drinking body armor, as this contained a moderate amount of potassium and minimal sodium.  Her potassium was also noted to be borderline elevated at previous checks.  It was noted that she was bradycardic with first-degree AV block and QRS slightly widened, greater than 120  ms on EKG.  Medication changes were deferred.  On 9/1, she presented to [facility] ED with c/o elevated BP and daughter concerned for near syncope. Earlier that morning, the patient skipped her AM dose of lisinopril, given her BP was reportedly low upon waking. She ate lunch and took her amlodipine around 1:15PM. After she finished watching her afternoon soap opera, and approximately two hours after eating lunch, she experienced 4/10 in severity, non-radiating, non-pleuritic, substernal chest tightness that lasted approximately 15 minutes. The chest tightness was associated with flushing. She called her daughter, who was  concerned as "her eyes did this thing that they do when she is about to pass out." Her daughter also noted she felt clammy. Patient, however,  denied any symptoms of near syncope. No associated SOB, DOE, dizziness, nausea, or emesis. In the ED, the patient had negative HS Tn and SBP 170 with last office visit SBP ~160. Patient denied chest pain since her original discomfort. EKG showed SR and an existing incomplete LBBB now progressed to LBBB. No acute ST/T changes. Cardiology consulted.  Most recently, and earlier this month, she was admitted with nausea, emesis, diarrhea, and abdominal pain thought likely due to food poisoning.  She was found to have acute GI bleed and focal colitis on CT.  GI consulted and felt that her symptoms were likely 2/2 food poisoning.  Diarrhea resolved shortly after presentation.  Xarelto was initially held with instructions to resume 2 days after discharge.  Due to leukocytosis on blood work, she was started on Zosyn and Cipro.  Oral antihypertensives were held during hospitalization with recommendation to resume after discharge.  Today, 04/20/20, she RTC as she was referred to cardiology after her GIB.  She states that she initially thought she was scheduled for follow-up with her PCP; however, it appears that they instead scheduled her with cardiology.  She has not yet seen GI. She has restarted her [place] and denies any further s/sx of bleeding.  She initially had diarrhea after discharge that shortly thereafter resolved.  She now reports constipation with hydration encouraged.  She does report darker stools; however, this is a usual finding for her on her Fe supplementation. She has not yet obtained repeat blood work to reassess her blood counts, which will be checked today. She reports bilateral LEE with LLE>RLE.  On review of EMR, she has not had documented pitting lower extremity edema in the past at clinic, though her weight does appear to be down from previous clinic weights.   This was discussed today with her reports that she usually has bilateral lower extremity edema that improves with elevation.  Slight erythema of the left lower extremity noted with recommendation to continue to monitor closely for any concerns for infection such as warmth. Home BP log brought with her today with home BP 110-150s. She reports that she was regularly attending neuro PT; however, recently, they needed a renewed PT order with pt request today for renewal of order, as she states she is now scheduled for neuro PT but requires an order from her PCP.  She states that, at discharge, she attempted to get into her PCP but unable to be scheduled until November 8.  She requests referral for neuro PT today as recommended below.  She denies any racing heart rate or palpitations.  She does report some symptoms consistent with amaurosis fugax, which she states alleviate with sips of diet [facility].  She reports ongoing issues with dizziness; however, she has learned to  work around them, and is slow with position changes.  She also continues to benefit from neuro PT.  No recent falls or syncope.  No orthopnea, PND, early satiety.  She reports medication compliance.  On review of discharge summary, it was mentioned that she was supposedly supposed to hold her antihypertensives for 2 days after discharge; however, both the patient and her daughter report today that this was never relayed to them, and they have been taking her antihypertensives as recommended.  Recommendations as below.  Home Medications    Current Outpatient Medications on File Prior to Visit  Medication Sig Dispense Refill  . [START ON 06/08/2020] alendronate (FOSAMAX) 70 MG tablet TAKE ONE TABLET BY MOUTH EVERY 7 DAYS. TAKE WITH A FULL GLASS OF WATER ON AN EMPTY STOMACH. (Patient taking differently: Take 70 mg by mouth every Friday. TAKE ONE TABLET BY MOUTH EVERY 7 DAYS. TAKE WITH A FULL GLASS OF WATER ON AN EMPTY STOMACH.) 12 tablet 3  .  amLODipine (NORVASC) 5 MG tablet Take 1 tablet (5 mg total) by mouth daily. 90 tablet 1  . atorvastatin (LIPITOR) 20 MG tablet Take 1 tablet (20 mg total) by mouth at bedtime. 90 tablet 3  . Calcium Carb-Cholecalciferol (CALCIUM 600 + D PO) Take 1 tablet by mouth daily.     . cholecalciferol (VITAMIN D) 1000 units tablet Take 1,000 Units by mouth daily.    . Cyanocobalamin (B-12) 1000 MCG/ML KIT Inject 1,000 mcg as directed every 30 (thirty) days.    . ferrous sulfate 325 (65 FE) MG tablet Take 325 mg by mouth 2 (two) times daily with a meal.    . latanoprost (XALATAN) 0.005 % ophthalmic solution Place 1 drop into the right eye at bedtime.     Marland Kitchen levothyroxine (EUTHYROX) 75 MCG tablet TAKE ONE TABLET BY MOUTH ON MONDAYS, WEDNESDAYS, FRIDAYS, AND SUNDAYS (Patient taking differently: Take 75 mcg by mouth as directed. TAKE ONE TABLET BY MOUTH ON MONDAYS, WEDNESDAYS, FRIDAYS, AND SUNDAYS) 52 tablet 2  . levothyroxine (EUTHYROX) 88 MCG tablet TAKE 1 TABLET BY MOUTH ON TUESDAYS, THURSDAYS, AND SATURDAYS (Patient taking differently: Take 88 mcg by mouth as directed. TAKE 1 TABLET BY MOUTH ON TUESDAYS, THURSDAYS, AND SATURDAYS) 39 tablet 2  . lisinopril (ZESTRIL) 20 MG tablet Take 20 mg by mouth daily.    . Multiple Vitamins-Minerals (MULTIVITAMIN ADULT PO) Take by mouth.    . Omega-3 Fatty Acids (FISH OIL) 1200 MG CAPS Take 1,200 mg by mouth daily.     . rivaroxaban (XARELTO) 20 MG TABS tablet Take 1 tablet (20 mg total) by mouth daily with supper. 90 tablet 2  . timolol (TIMOPTIC) 0.5 % ophthalmic solution Place 1 drop into the right eye 2 (two) times daily.      No current facility-administered medications on file prior to visit.    Review of Systems    She denies chest pain, palpitations, dyspnea, pnd, orthopnea, n, v, syncope, weight gain, or early satiety.  She reports dizziness that has remained under control with slow position changes and neuro PT.  She reports bilateral lower extremity edema that  improves with leg elevation she reports resolution of her diarrhea and GI bleed symptoms as above..   All other systems reviewed and are otherwise negative except as noted above.  Physical Exam    VS:  BP (!) 164/62 (BP Location: Left Arm, Patient Position: Sitting, Cuff Size: Normal)   Pulse 67   Ht '5\' 2"'  (1.575 m)   Wt  139 lb 4 oz (63.2 kg)   LMP  (LMP Unknown)   SpO2 98%   BMI 25.47 kg/m  , BMI Body mass index is 25.47 kg/m. GEN: Well nourished, well developed, in no acute distress. HEENT: normal. Neck: Supple, no JVD, carotid bruits, or masses.L bruit, no R buit.  Cardiac: RRR, no murmurs, rubs, or gallops. No clubbing, cyanosis, moderate RLE edema, 1+ LLEE and erythema.  Radials/DP/PT 2+ and equal bilaterally.  Respiratory:  Respirations regular and unlabored, clear to auscultation bilaterally. GI: Soft, nontender, nondistended, BS + x 4. MS: no deformity or atrophy.  Skin: warm and dry, no rash. Neuro:  Strength and sensation are intact. Psych: Normal affect.  Accessory Clinical Findings    ECG personally reviewed by me today - NSR, 1st degree AVB, PRi268m, IVCD, LBBB - no acute changes.  VITALS Reviewed today   Temp Readings from Last 3 Encounters:  04/13/20 98.5 F (36.9 C) (Oral)  04/08/20 98.4 F (36.9 C) (Oral)  03/08/20 (!) 96.8 F (36 C) (Tympanic)   BP Readings from Last 3 Encounters:  04/20/20 (!) 164/62  04/13/20 138/88  04/08/20 134/80   Pulse Readings from Last 3 Encounters:  04/20/20 67  04/13/20 64  04/08/20 63    Wt Readings from Last 3 Encounters:  04/20/20 139 lb 4 oz (63.2 kg)  04/13/20 142 lb 10.2 oz (64.7 kg)  04/08/20 143 lb 12.8 oz (65.2 kg)     LABS  reviewed today   Lab Results  Component Value Date   WBC 13.3 (H) 04/13/2020   HGB 9.2 (L) 04/13/2020   HCT 26.1 (L) 04/13/2020   MCV 93.2 04/13/2020   PLT 144 (L) 04/13/2020   Lab Results  Component Value Date   CREATININE 1.00 04/13/2020   BUN 19 04/13/2020   NA 132 (L)  04/13/2020   K 3.7 04/13/2020   CL 103 04/13/2020   CO2 21 (L) 04/13/2020   Lab Results  Component Value Date   ALT 18 04/11/2020   AST 24 04/11/2020   ALKPHOS 23 (L) 04/11/2020   BILITOT 0.9 04/11/2020   Lab Results  Component Value Date   CHOL 153 07/30/2019   HDL 50 07/30/2019   LDLCALC 81 07/30/2019   TRIG 108 07/30/2019   CHOLHDL 3.1 07/30/2019    Lab Results  Component Value Date   HGBA1C 6.0 (H) 07/02/2018   Lab Results  Component Value Date   TSH 3.03 10/21/2019     STUDIES/PROCEDURES reviewed today   Echo 02/2019 1. The left ventricle has normal systolic function with an ejection  fraction of 60-65%. The cavity size was normal. Left ventricular diastolic  Doppler parameters are consistent with pseudonormalization.  2. The right ventricle has normal systolic function. The cavity was  normal. There is no increase in right ventricular wall thickness.Unable to  estimate RVSP  3. The aorta is normal unless otherwise noted.   Carotids Summary:  Right Carotid: Velocities in the right ICA are consistent with a 1-39%  stenosis.         Non-hemodynamically significant plaque <50% noted in the  CCA.         Mild to moderate heterogenous plaque in the bulb/proximal  ICA.  Left Carotid: Velocities in the left ICA are consistent with a 40-59%  stenosis.        Non-hemodynamically significant plaque noted in the CCA.  Moderate        heterogeneous plaque in the bulb/ICA. Tortuous ICA from  proximal  to mid.  Vertebrals: Bilateral vertebral arteries demonstrate antegrade flow.  Subclavians: Normal flow hemodynamics were seen in bilateral subclavian        arteries.   Assessment & Plan    Hypertension --BP suboptimal with history of vasovagal and orthostasis events; therefore, hesitant to aggressively increase any of her current antihypertensives.  On review of her most recent admission for food  poisoning/colitis with GI bleeding, it appears as if she received IV fluids during that time.  Nevertheless, she appears euvolemic and well compensated on exam with LEE noted but wt decreased from that of previous clinic visits.  Will recheck a BMET to ensure stable renal function and electrolytes.  Repeat CBC to ensure LEE not 2/2 anemia in setting of recent GIB and restart of [place]. Further recommendations, if indicated, at that time  This plan was discussed in detail with patient agreement. Ongoing restriction of sodium and fluids discussed as below.  Recurrent syncope --History of both orthostasis and vasovagal syncope.  She denies any recent falls / syncope and reports minimal dizziness. She states she is slow to make position changes and takes things at her own speed.  BP today elevated with recommendation to defer medication changes until repeat BMET and labs with consideration of log with BP as low as 110s, given her history of syncope and falls.  Patient is agreeable to this plan.  She continues to monitor her BP at home with BP varying from SBP 110s to elevated BP in the 150s. Of note, per patient request, we will reorder her neuro PT. She is unable to see her PCP before this time, who previously ordered her PT, and with notification to pt that she needs to order placed again given her most recent admission to continue with her already scheduled PT appts. We will reorder neuro PT, as she does report significant benefit from engaging in this PT.  PAF --Maintaining NSR today with rates well controlled.  As above, first-degree AV block persists, as well as left bundle branch block.  We will continue to monitor this on EKG.  Previously, attempts at discontinuation of atenolol have failed with worsening vision.  She denies any signs or symptoms of bleeding with restart of anticoagulation s/p recent admission with colitis and GI bleed.  She will continue to monitor for any concerning signs or symptoms of  bleeding.  Continue current anticoagulation.  No medication changes at this time.  Further recommendations, if indicated, pending repeat CBC.  Recent admission with GI bleeding --Recent admission with food poisoning/colitis/GI bleeding and evaluated by GI with recommendation to hold anticoagulation for 2 days after discharge, at which time she was instructed to restart her Xarelto.  She returns to clinic today and reports that she has restarted her Xarelto without any concerning symptoms.  Denies any further signs or symptoms of bleeding.  Reports darker stool in the setting of iron supplementation.  Repeat CBC today with further recommendations, if indicated at that time.  Continue current medications, including anticoagulation, and as above.  Chronic HFpEF --No signs or symptoms of worsening heart failure.  Euvolemic and well compensated on exam, despite lower extremity edema.  Repeat CBC and BMET ordered.  Consider lower extremity edema 2/2 recent anemia, as well as possible venous insufficiency given skin changes. Clinic weight decreased from that of previous clinic weight.  Continue to recommend low-sodium diet and fluid restriction under 2 L.  Avoid beverages with high salt content, as previously recommended, and as outlined above.  Continue  current medications.  Hyponatremia --Again reviewed recommendation to avoid salt, as previously discussed by primary cardiologist.  She is no longer drinking body armor drinks to improve her sodium.  We again reviewed the recommendation against increasing salt to improve her sodium, as previously discussed, and with patient understanding.  Continue to monitor sodium.  We will repeat a BMET today.  Carotid artery disease --Previously followed by Dr. Delana Meyer of [facility].  Most recent carotid studies as above.  Left carotid bruit appreciated on exam today with patient report of periodic amaurosis fugax.  Will recheck carotids with further recommendations, if indicated,  at that time.  Recommend ongoing cholesterol control.  Continue Xarelto in lieu of ASA.  Medication changes: None Labs ordered:BMET, CBC Studies / Imaging ordered: Carotids Future considerations: Pending labs Disposition: RTC as scheduled   Arvil Chaco, PA-C 04/20/2020

## 2020-04-20 NOTE — Patient Instructions (Signed)
Medication Instructions:  Your physician recommends that you continue on your current medications as directed. Please refer to the Current Medication list given to you today.  *If you need a refill on your cardiac medications before your next appointment, please call your pharmacy*   Lab Work:  BMET and CBC drawn in office today.   If you have labs (blood work) drawn today and your tests are completely normal, you will receive your results only by: Marland Kitchen MyChart Message (if you have MyChart) OR . A paper copy in the mail If you have any lab test that is abnormal or we need to change your treatment, we will call you to review the results.   Testing/Procedures:  1.  Ultrasound of the carotid arteries.   Follow-Up: At [facility], you and your health needs are our priority.  As part of our continuing mission to provide you with exceptional heart care, we have created designated Provider Care Teams.  These Care Teams include your primary Cardiologist (physician) and Advanced Practice Providers (APPs -  Physician Assistants and Nurse Practitioners) who all work together to provide you with the care you need, when you need it.  We recommend signing up for the patient portal called "MyChart".  Sign up information is provided on this After Visit Summary.  MyChart is used to connect with patients for Virtual Visits (Telemedicine).  Patients are able to view lab/test results, encounter notes, upcoming appointments, etc.  Non-urgent messages can be sent to your provider as well.   To learn more about what you can do with MyChart, go to [email or web address].    Your next appointment:   05/19/20  0900  The format for your next appointment:   In Person  Provider:   You may see Nelva Bush, MD or one of the following Advanced Practice Providers on your designated Care Team:    [name], NP  Christell Faith, PA-C  Marrianne Mood, PA-C  Cadence Kathlen Mody, [place]    Other  Instructions

## 2020-04-21 ENCOUNTER — Ambulatory Visit: Payer: Medicare Other | Admitting: Physical Therapy

## 2020-04-21 ENCOUNTER — Encounter: Payer: Self-pay | Admitting: Physical Therapy

## 2020-04-21 DIAGNOSIS — M6281 Muscle weakness (generalized): Secondary | ICD-10-CM

## 2020-04-21 DIAGNOSIS — R262 Difficulty in walking, not elsewhere classified: Secondary | ICD-10-CM

## 2020-04-21 LAB — CBC
Hematocrit: 31 % — ABNORMAL LOW (ref 34.0–46.6)
Hemoglobin: 10.1 g/dL — ABNORMAL LOW (ref 11.1–15.9)
MCH: 30.8 pg (ref 26.6–33.0)
MCHC: 32.6 g/dL (ref 31.5–35.7)
MCV: 95 fL (ref 79–97)
Platelets: 260 10*3/uL (ref 150–450)
RBC: 3.28 x10E6/uL — ABNORMAL LOW (ref 3.77–5.28)
RDW: 13.1 % (ref 11.7–15.4)
WBC: 9.2 10*3/uL (ref 3.4–10.8)

## 2020-04-21 LAB — BASIC METABOLIC PANEL
BUN/Creatinine Ratio: 14 (ref 12–28)
BUN: 11 mg/dL (ref 10–36)
CO2: 22 mmol/L (ref 20–29)
Calcium: 9 mg/dL (ref 8.7–10.3)
Chloride: 102 mmol/L (ref 96–106)
Creatinine, Ser: 0.76 mg/dL (ref 0.57–1.00)
GFR calc Af Amer: 79 mL/min/{1.73_m2} (ref >59)
GFR calc non Af Amer: 68 mL/min/{1.73_m2} (ref >59)
Glucose: 91 mg/dL (ref 65–99)
Potassium: 4.2 mmol/L (ref 3.5–5.2)
Sodium: 136 mmol/L (ref 134–144)

## 2020-04-21 NOTE — Therapy (Signed)
[place] MAIN [facility] SERVICES [address] [place], [place], [address] Phone: [phone]   Fax:  [phone]  Physical Therapy Treatment / Re-evaluation  Patient Details  Name: Sydney Mcdaniel MRN: [id number] Date of Birth: 1924-10-22 Referring Provider (PT): Delsa Grana   Encounter Date: 04/21/2020   PT End of Session - 04/21/20 1109    Visit Number 13    Number of Visits 17    Date for PT Re-Evaluation 04/14/20    PT Start Time 1101    PT Stop Time 1145    PT Time Calculation (min) 44 min    Equipment Utilized During Treatment Gait belt    Activity Tolerance Patient tolerated treatment well    Behavior During Therapy WFL for tasks assessed/performed           Past Medical History:  Diagnosis Date  . Allergy   . Anemia   . Cataract   . CLL (chronic lymphocytic leukemia) ([place])   . Glaucoma   . H/O: hysterectomy    Total  . Hyperlipidemia   . Hypertension   . Hypothyroidism   . Impaired fasting glucose   . Lichen sclerosus   . Osteoporosis    Hips  . PAF (paroxysmal atrial fibrillation) ([place])    a. 06/15/2016 Event monitor: 4% afib burden; b. CHA2DS2VASc - 4-->Xarelto.  . Syncope    a. 03/2016 Echo: EF55-60%, no rwma, mild AI/MR, nl PASP; b. 03/2016 48h Holter: no significant arrhythmias/pauses; c. 03/2016 MV: mild apical defect, likely breast attenuation, nl EF, low risk; d. 05/2016 Event monitor: No significant arrhythmia; e. 05/2016 Event monitor: PAF (4%).    Past Surgical History:  Procedure Laterality Date  . APPENDECTOMY    . EYE SURGERY     Glaucoma  . TOTAL ABDOMINAL HYSTERECTOMY      There were no vitals filed for this visit.   Subjective Assessment - 04/21/20 1107    Subjective Patient was in the hospital with food poisoning and was DC 04/13/20.    Pertinent History Patient had HHPT 10 months ago. Before that she was in the hospital with BP issues over night. She was walking with spc outdoors, inside  she holds to the walls.    Limitations Walking;Standing    How long can you stand comfortably? 15 minutes    How long can you walk comfortably? 5 mins    Patient Stated Goals to have better balance and walk better    Currently in Pain? No/denies    Pain Score 0-No pain              [facility] PT Assessment - 04/21/20 0001      Berg Balance Test   Sit to Stand Able to stand  independently using hands    Standing Unsupported Able to stand 2 minutes with supervision    Sitting with Back Unsupported but Feet Supported on Floor or Stool Able to sit safely and securely 2 minutes    Stand to Sit Controls descent by using hands    Transfers Able to transfer safely, definite need of hands    Standing Unsupported with Eyes Closed Able to stand 10 seconds safely    Standing Unsupported with Feet Together Needs help to attain position but able to stand for 30 seconds with feet together    From Standing, Reach Forward with Outstretched Arm Reaches forward but needs supervision    From Standing Position, Pick up Object from [place] to pick up  shoe, needs supervision    From Standing Position, Turn to Look Behind Over each Shoulder Turn sideways only but maintains balance    Turn 360 Degrees Able to turn 360 degrees safely one side only in 4 seconds or less    Standing Unsupported, Alternately Place Feet on Step/Stool Able to complete >2 steps/needs minimal assist    Standing Unsupported, One Foot in Front Needs help to step but can hold 15 seconds    Standing on One Leg Unable to try or needs assist to prevent fall    Total Score 32            PAIN: no reports of pain  POSTURE:WFL   PROM/AROM: PROM BUE:WFL AROM BUE:WFL PROM BLE:WFL AROM BLE:WFL  STRENGTH:  Graded on a 0-5 scale Muscle Group Left Right                          Hip Flex 3/5 3/5  Hip Abd -3/5 -3/5  Hip Add 22/5 2/5          Knee Flex 3+/5 3+/5  Knee Ext 3+/5 3+/5  Ankle DF -4/5 -4/5  Ankle PF -4/5 -4/5    SENSATION:  BUE : WNL BLE : WNL  NEUROLOGICAL SCREEN: (2+ unless otherwise noted.) N=normal  Ab=abnormal   Level Dermatome R L  C3 Anterior Neck  N N  C4 Top of Shoulder N N  C5 Lateral Upper Arm  N N  C6 Lateral Arm/ Thumb  N N  C7 Middle Finger  N N  C8 4th & 5th Finger N N  T1 Medial Arm N N  L2 Medial thigh/groin N N  L3 Lower thigh/med.knee N N  L4 Medial leg/lat thigh N N  L5 Lat. leg & dorsal foot N N  S1 post/lat foot/thigh/leg N N  S2 Post./med. thigh & leg N N    SOMATOSENSORY:  Any N & T in extremities or weakness: reports :         Sensation           Intact      Diminished         Absent  Light touch LEs                                  FUNCTIONAL MOBILITY:needs definite use of UE for transfers   BALANCE: 32/56 Berg balance   GAIT: Ambualtes with spc with step to gait pattern, poor heel toe gait pattern for short distances  OUTCOME MEASURES: see goals for current measurements                    PT Education - 04/21/20 1109    Education Details poc    Person(s) Educated Patient    Methods Explanation    Comprehension Verbalized understanding            PT Short Term Goals - 04/21/20 1110      PT SHORT TERM GOAL #1   Title Patient (> 21 years old) will complete five times sit to stand test in < 15 seconds indicating an increased LE strength and improved balance.    Time 4    Period Weeks    Status On-going    Target Date 05/19/20             PT Long Term Goals - 04/21/20 1110  PT LONG TERM GOAL #1   Title Patient will increase BLE gross strength to 4+/5 as to improve functional strength for independent gait, increased standing tolerance and increased ADL ability.    Baseline 8    Time 8    Period Weeks    Status New    Target Date 06/16/20      PT LONG TERM GOAL #2   Title Patient will increase Berg Balance score toi >45  to decrease falls risk.    Baseline @eval  28; 10/7: 39., 04/21/20=32/56    Time  8    Period Weeks    Status New    Target Date 06/16/20      PT LONG TERM GOAL #3   Title Patient will reduce timed up and go to <11 seconds to reduce fall risk and demonstrate improved transfer/gait ability.    Baseline At eval 27sec, 10/7: 27sec (some confusion about sitting at end) eschews SPC use, 04/21/20=27.28 sec    Time 8    Period Weeks    Status New    Target Date 06/16/20      PT LONG TERM GOAL #4   Title Patient will increase 10 meter walk test to >1.36m/s as to improve gait speed for better community ambulation and to reduce fall risk.    Baseline 48sec at eval; 10/7: 19sec self selected, 16sec fastest, 04/21/20/= .47 m/ sec    Time 8    Period Weeks    Status New    Target Date 06/16/20                 Plan - 04/21/20 1110    Clinical Impression Statement Patient presents today after being in the hospital for food poisoning. She has gotten weak from being in the hospital. She was making progress towards her PT goals before she was admitted to the hospital. She has weakness in BLE, decreased standing balance for static and dynamic balance and decreased gait . She will benefit from skilled PT to improve mobility and decrease her falls risk.    Personal Factors and Comorbidities Age;Comorbidity 1;Comorbidity 2    Comorbidities HTN, osteoporosis, visual impairment, back pain, incontenence, Cancer    Examination-Activity Limitations Locomotion Level;Stairs    Stability/Clinical Decision Making Stable/Uncomplicated    Rehab Potential Good    PT Frequency 2x / week    PT Duration 8 weeks    PT Treatment/Interventions Neuromuscular re-education;Therapeutic exercise;Therapeutic activities;Balance training;Functional mobility training;Gait training    PT Next Visit Plan FOTO; update HEP as needed    PT Home Exercise Plan needs a revision, renovation, redux    Consulted and Agree with Plan of Care Patient;Family member/caregiver    Family Member Consulted DTR            Patient will benefit from skilled therapeutic intervention in order to improve the following deficits and impairments:  Abnormal gait, Decreased activity tolerance, Decreased endurance, Decreased knowledge of use of DME, Impaired flexibility, Difficulty walking, Decreased balance, Pain  Visit Diagnosis: Difficulty in walking, not elsewhere classified  Muscle weakness (generalized)     Problem List Patient Active Problem List   Diagnosis Date Noted  . Hematochezia 04/11/2020  . Acute colitis 04/11/2020  . GI bleeding 04/11/2020  . Iron deficiency anemia 03/08/2020  . Goals of care, counseling/discussion 03/08/2020  . 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 Sinus bradycardia 04/09/2019  . First degree AV block 04/09/2019  . Postural dizziness with presyncope 02/25/2019  . Osteoporosis 08/21/2018  .  Osteoarthritis of left hip 05/21/2018  . Bilateral carotid artery stenosis 04/19/2018  . History of syncope 10/17/2017  . MCI (mild cognitive impairment) 08/12/2017  . Lung nodules 07/09/2017  . Paroxysmal atrial fibrillation ([place]) 04/18/2017  . Pleural effusion 10/20/2016  . Hyponatremia 10/20/2016  . Chronic diastolic congestive heart failure ([place]) 06/28/2016  . Primary open-angle glaucoma, bilateral, mild stage 05/08/2016  . Labile blood glucose 04/27/2016  . Bladder prolapse, female, acquired 02/25/2016  . 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  . B12 deficiency 01/11/2016  . Anemia 01/10/2016  . CLL (chronic lymphocytic leukemia) ([place]) 01/10/2016  . 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 12/21/2015  . Bradycardia 12/21/2015  . Lymphocytosis 06/25/2015  . Impaired fasting glucose 06/22/2015  . Carotid atherosclerosis 06/22/2015  . Essential hypertension 12/22/2014  . Hyperlipidemia 12/22/2014  . Hypothyroidism 12/22/2014    Alanson Puls, PT DPT 04/21/2020, 12:17 PM  [place] MAIN [facility] SERVICES [address] [place], [place], [address] Phone: [phone]   Fax:  [phone]  Name: Sydney Mcdaniel MRN: [id number] Date of Birth: 04-08-1925

## 2020-04-26 ENCOUNTER — Other Ambulatory Visit: Payer: Self-pay

## 2020-04-26 ENCOUNTER — Ambulatory Visit: Payer: Medicare Other | Attending: Family Medicine

## 2020-04-26 DIAGNOSIS — M6281 Muscle weakness (generalized): Secondary | ICD-10-CM | POA: Diagnosis not present

## 2020-04-26 DIAGNOSIS — R262 Difficulty in walking, not elsewhere classified: Secondary | ICD-10-CM | POA: Insufficient documentation

## 2020-04-26 NOTE — Therapy (Signed)
[place] MAIN [facility] SERVICES [address] [place], [place], [address] Phone: [phone]   Fax:  [phone]  Physical Therapy Treatment  Patient Details  Name: Sydney Mcdaniel MRN: [id number] Date of Birth: [birth year] Referring Provider (PT): Delsa Grana   Encounter Date: 04/26/2020   PT End of Session - 04/26/20 1257    Visit Number 14    Number of Visits 17    Date for PT Re-Evaluation 06/16/20    PT Start Time 1300    PT Stop Time 1345    PT Time Calculation (min) 45 min    Equipment Utilized During Treatment Gait belt    Activity Tolerance Patient tolerated treatment well    Behavior During Therapy WFL for tasks assessed/performed           Past Medical History:  Diagnosis Date   Allergy    Anemia    Cataract    CLL (chronic lymphocytic leukemia) ([place])    Glaucoma    H/O: hysterectomy    Total   Hyperlipidemia    Hypertension    Hypothyroidism    Impaired fasting glucose    Lichen sclerosus    Osteoporosis    Hips   PAF (paroxysmal atrial fibrillation) ([place])    a. 06/15/2016 Event monitor: 4% afib burden; b. CHA2DS2VASc - 4-->Xarelto.   Syncope    a. 03/2016 Echo: EF55-60%, no rwma, mild AI/MR, nl PASP; b. 03/2016 48h Holter: no significant arrhythmias/pauses; c. 03/2016 MV: mild apical defect, likely breast attenuation, nl EF, low risk; d. 05/2016 Event monitor: No significant arrhythmia; e. 05/2016 Event monitor: PAF (4%).    Past Surgical History:  Procedure Laterality Date   APPENDECTOMY     EYE SURGERY     Glaucoma   TOTAL ABDOMINAL HYSTERECTOMY      There were no vitals filed for this visit.   Subjective Assessment - 04/26/20 1616    Subjective Patient reports no falls or LOB since last session. Presents with daughter.    Pertinent History Patient had HHPT 10 months ago. Before that she was in the hospital with BP issues over night. She was walking with spc outdoors, inside she holds  to the walls.    Limitations Walking;Standing    How long can you stand comfortably? 15 minutes    How long can you walk comfortably? 5 mins    Patient Stated Goals to have better balance and walk better    Currently in Pain? No/denies             TREATMENT     Ther-ex  Nu-step L3 x 4 minutes for cardiovascular challenge Sumo squat with UE support on bar in front, cues for depth of squat 10x #2 ankle weights: seated LAQ kicking soccer ball for coordination, strengthening of LE's, and timing of muscle contraction x 2 minutes #2 ankle weight marches to PT hand 15x each LE    Neuromuscular Re-education by support bar:   6" orange hurdle lateral step-overs with UE support x 10 leading with each leg; airex pad: normal base of support 2x 60 seconds cues for breathing airex pad: flat hand on bar: horizontal head turns 15x, vertical head turns 15x 4" step crane step up/downs 10x each LE  Modified single limb stance: soccer ball under foot 30 seconds x 2 trials each LE   opposite arm leg raises in seated position, decrease from 5x each side, 4x, 3x,2x,1x, unable to perform 1x alternating    Patient  performed with instruction, verbal cues, tactile cues of therapist: goal: increase tissue extensibility, promote proper posture, improve mobility   vitals monitored throughout session.                          PT Education - 04/26/20 1257    Education Details exercise technique, body mechanics    Person(s) Educated Patient    Methods Explanation;Demonstration;Tactile cues;Verbal cues    Comprehension Verbalized understanding;Returned demonstration;Verbal cues required;Tactile cues required            PT Short Term Goals - 04/21/20 1110      PT SHORT TERM GOAL #1   Title Patient (> 9 years old) will complete five times sit to stand test in < 15 seconds indicating an increased LE strength and improved balance.    Time 4    Period Weeks    Status On-going     Target Date 05/19/20             PT Long Term Goals - 04/21/20 1110      PT LONG TERM GOAL #1   Title Patient will increase BLE gross strength to 4+/5 as to improve functional strength for independent gait, increased standing tolerance and increased ADL ability.    Baseline 8    Time 8    Period Weeks    Status New    Target Date 06/16/20      PT LONG TERM GOAL #2   Title Patient will increase Berg Balance score toi >45  to decrease falls risk.    Baseline @eval  28; 10/7: 39., 04/21/20=32/56    Time 8    Period Weeks    Status New    Target Date 06/16/20      PT LONG TERM GOAL #3   Title Patient will reduce timed up and go to <11 seconds to reduce fall risk and demonstrate improved transfer/gait ability.    Baseline At eval 27sec, 10/7: 27sec (some confusion about sitting at end) eschews SPC use, 04/21/20=27.28 sec    Time 8    Period Weeks    Status New    Target Date 06/16/20      PT LONG TERM GOAL #4   Title Patient will increase 10 meter walk test to >1.49m/s as to improve gait speed for better community ambulation and to reduce fall risk.    Baseline 48sec at eval; 10/7: 19sec self selected, 16sec fastest, 04/21/20/= .47 m/ sec    Time 8    Period Weeks    Status New    Target Date 06/16/20                 Plan - 04/26/20 1618    Clinical Impression Statement Patient demonstrates excellent motivation throughout session. She initially is fearful of single limb stabilization and unstable surfaces but  Becomes more confident with repetition. Rest breaks were required between interventions due to fatigue at this time. Patient will continue benefit from skilled PT services to address deficits in mobility, balance, and improve function at home.    Personal Factors and Comorbidities Age;Comorbidity 1;Comorbidity 2    Comorbidities HTN, osteoporosis, visual impairment, back pain, incontenence, Cancer    Examination-Activity Limitations Locomotion Level;Stairs     Stability/Clinical Decision Making Stable/Uncomplicated    Rehab Potential Good    PT Frequency 2x / week    PT Duration 8 weeks    PT Treatment/Interventions Neuromuscular re-education;Therapeutic exercise;Therapeutic activities;Balance training;Functional mobility training;Gait  training    PT Next Visit Plan FOTO; update HEP as needed    PT Home Exercise Plan needs a revision, renovation, redux    Consulted and Agree with Plan of Care Patient;Family member/caregiver    Family Member Consulted DTR           Patient will benefit from skilled therapeutic intervention in order to improve the following deficits and impairments:  Abnormal gait, Decreased activity tolerance, Decreased endurance, Decreased knowledge of use of DME, Impaired flexibility, Difficulty walking, Decreased balance, Pain  Visit Diagnosis: Difficulty in walking, not elsewhere classified  Muscle weakness (generalized)     Problem List Patient Active Problem List   Diagnosis Date Noted   Hematochezia 04/11/2020   Acute colitis 04/11/2020   GI bleeding 04/11/2020   Iron deficiency anemia 03/08/2020   Goals of care, counseling/discussion 03/08/2020   Positive direct antiglobulin test (DAT) 12/04/2019   Sinus bradycardia 04/09/2019   First degree AV block 04/09/2019   Postural dizziness with presyncope 02/25/2019   Osteoporosis 08/21/2018   Osteoarthritis of left hip 05/21/2018   Bilateral carotid artery stenosis 04/19/2018   History of syncope 10/17/2017   MCI (mild cognitive impairment) 08/12/2017   Lung nodules 07/09/2017   Paroxysmal atrial fibrillation ([facility]) 04/18/2017   Pleural effusion 10/20/2016   Hyponatremia 10/20/2016   Chronic diastolic congestive heart failure ([place]) 06/28/2016   Primary open-angle glaucoma, bilateral, mild stage 05/08/2016   Labile blood glucose 04/27/2016   Bladder prolapse, female, acquired 02/25/2016   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   B12 deficiency  01/11/2016   Anemia 01/10/2016   CLL (chronic lymphocytic leukemia) ([place]) 01/10/2016   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 12/21/2015   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 12/21/2015   Bradycardia 12/21/2015   Lymphocytosis 06/25/2015   Impaired fasting glucose 06/22/2015   Carotid atherosclerosis 06/22/2015   Essential hypertension 12/22/2014   Hyperlipidemia 12/22/2014   Hypothyroidism 12/22/2014   Janna Arch, PT, DPT   04/26/2020, 4:19 PM  [place] [facility] MAIN [facility] SERVICES [address] [place], [place], [address] Phone: [phone]   Fax:  [phone]  Name: Sydney Mcdaniel MRN: [id number] Date of Birth: 07-25-1924

## 2020-04-28 ENCOUNTER — Encounter: Payer: Self-pay | Admitting: Physical Therapy

## 2020-04-28 ENCOUNTER — Ambulatory Visit: Payer: Medicare Other | Admitting: Physical Therapy

## 2020-04-28 ENCOUNTER — Other Ambulatory Visit: Payer: Self-pay

## 2020-04-28 DIAGNOSIS — M6281 Muscle weakness (generalized): Secondary | ICD-10-CM

## 2020-04-28 DIAGNOSIS — R262 Difficulty in walking, not elsewhere classified: Secondary | ICD-10-CM | POA: Diagnosis not present

## 2020-04-28 NOTE — Therapy (Signed)
[place] MAIN [facility] SERVICES [address] [place], [place], [address] Phone: [phone]   Fax:  [phone]  Physical Therapy Treatment  Patient Details  Name: Sydney Mcdaniel MRN: [id number] Date of Birth: 03-Jul-1924 Referring Provider (PT): Delsa Grana   Encounter Date: 04/28/2020    Past Medical History:  Diagnosis Date   Allergy    Anemia    Cataract    CLL (chronic lymphocytic leukemia) ([place])    Glaucoma    H/O: hysterectomy    Total   Hyperlipidemia    Hypertension    Hypothyroidism    Impaired fasting glucose    Lichen sclerosus    Osteoporosis    Hips   PAF (paroxysmal atrial fibrillation) ([place])    a. 06/15/2016 Event monitor: 4% afib burden; b. CHA2DS2VASc - 4-->Xarelto.   Syncope    a. 03/2016 Echo: EF55-60%, no rwma, mild AI/MR, nl PASP; b. 03/2016 48h Holter: no significant arrhythmias/pauses; c. 03/2016 MV: mild apical defect, likely breast attenuation, nl EF, low risk; d. 05/2016 Event monitor: No significant arrhythmia; e. 05/2016 Event monitor: PAF (4%).    Past Surgical History:  Procedure Laterality Date   APPENDECTOMY     EYE SURGERY     Glaucoma   TOTAL ABDOMINAL HYSTERECTOMY      There were no vitals filed for this visit.   Subjective Assessment - 04/28/20 1322    Subjective Patient reports no falls or LOB since last session. Presents with daughter.    Pertinent History Patient had HHPT 10 months ago. Before that she was in the hospital with BP issues over night. She was walking with spc outdoors, inside she holds to the walls.    Limitations Walking;Standing    How long can you stand comfortably? 15 minutes    How long can you walk comfortably? 5 mins    Patient Stated Goals to have better balance and walk better    Currently in Pain? No/denies    Pain Score 0-No pain           Treatment: Nu-step x 5 mins Bridges x 10  SLR x 10 X 2 BLE Hip abd/Er x 30 Step fwd/ bwd x 15   Stepping over 1/2 foam x 15  Tapping stool from floor x 15  Step ups to 6 inch stool x 15  Gait training with walking sticks x 150 feet x 2   Patient performed with instruction, verbal cues, tactile cues of therapist: goal: increase tissue extensibility, promote proper posture, improve mobility                         PT Education - 04/28/20 1323    Education Details exercise HEP    Person(s) Educated Patient    Methods Explanation    Comprehension Verbalized understanding            PT Short Term Goals - 04/21/20 1110      PT SHORT TERM GOAL #1   Title Patient ( > 84 years old) will complete five times sit to stand test in < 15 seconds indicating an increased LE strength and improved balance.    Time 4    Period Weeks    Status On-going    Target Date 05/19/20             PT Long Term Goals - 04/21/20 1110      PT LONG TERM GOAL #1   Title Patient will  increase BLE gross strength to 4+/5 as to improve functional strength for independent gait, increased standing tolerance and increased ADL ability.    Baseline 8    Time 8    Period Weeks    Status New    Target Date 06/16/20      PT LONG TERM GOAL #2   Title Patient will increase Berg Balance score toi >45  to decrease falls risk.    Baseline @eval  28; 10/7: 39., 04/21/20=32/56    Time 8    Period Weeks    Status New    Target Date 06/16/20      PT LONG TERM GOAL #3   Title Patient will reduce timed up and go to <11 seconds to reduce fall risk and demonstrate improved transfer/gait ability.    Baseline At eval 27sec, 10/7: 27sec (some confusion about sitting at end) eschews SPC use, 04/21/20=27.28 sec    Time 8    Period Weeks    Status New    Target Date 06/16/20      PT LONG TERM GOAL #4   Title Patient will increase 10 meter walk test to >1.46m/s as to improve gait speed for better community ambulation and to reduce fall risk.    Baseline 48sec at eval; 10/7: 19sec self selected,  16sec fastest, 04/21/20/= .47 m/ sec    Time 8    Period Weeks    Status New    Target Date 06/16/20                 Plan - 04/28/20 1408    Clinical Impression Statement Patient instructed in intermediate strengthening and balance exercise.  Patient requires min Vcs for correct exercise technique including to improve LE control with standing exercise.. Patient would benefit from additional skilled PT intervention to improve balance/gait safety and reduce fall risk.   Personal Factors and Comorbidities Age;Comorbidity 1;Comorbidity 2    Comorbidities HTN, osteoporosis, visual impairment, back pain, incontenence, Cancer    Examination-Activity Limitations Locomotion Level;Stairs    Stability/Clinical Decision Making Stable/Uncomplicated    Rehab Potential Good    PT Frequency 2x / week    PT Duration 8 weeks    PT Treatment/Interventions Neuromuscular re-education;Therapeutic exercise;Therapeutic activities;Balance training;Functional mobility training;Gait training    PT Next Visit Plan FOTO; update HEP as needed    PT Home Exercise Plan needs a revision, renovation, redux    Consulted and Agree with Plan of Care Patient;Family member/caregiver    Family Member Consulted DTR           Patient will benefit from skilled therapeutic intervention in order to improve the following deficits and impairments:  Abnormal gait, Decreased activity tolerance, Decreased endurance, Decreased knowledge of use of DME, Impaired flexibility, Difficulty walking, Decreased balance, Pain  Visit Diagnosis: Difficulty in walking, not elsewhere classified  Muscle weakness (generalized)     Problem List Patient Active Problem List   Diagnosis Date Noted   Hematochezia 04/11/2020   Acute colitis 04/11/2020   GI bleeding 04/11/2020   Iron deficiency anemia 03/08/2020   Goals of care, counseling/discussion 03/08/2020   Positive direct antiglobulin test (DAT) 12/04/2019   Sinus  bradycardia 04/09/2019   First degree AV block 04/09/2019   Postural dizziness with presyncope 02/25/2019   Osteoporosis 08/21/2018   Osteoarthritis of left hip 05/21/2018   Bilateral carotid artery stenosis 04/19/2018   History of syncope 10/17/2017   MCI (mild cognitive impairment) 08/12/2017   Lung nodules 07/09/2017   Paroxysmal atrial  fibrillation ([place]) 04/18/2017   Pleural effusion 10/20/2016   Hyponatremia 10/20/2016   Chronic diastolic congestive heart failure ([place]) 06/28/2016   Primary open-angle glaucoma, bilateral, mild stage 05/08/2016   Labile blood glucose 04/27/2016   Bladder prolapse, female, acquired 02/25/2016   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   B12 deficiency 01/11/2016   Anemia 01/10/2016   CLL (chronic lymphocytic leukemia) ([place]) 01/10/2016   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 12/21/2015   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 12/21/2015   Bradycardia 12/21/2015   Lymphocytosis 06/25/2015   Impaired fasting glucose 06/22/2015   Carotid atherosclerosis 06/22/2015   Essential hypertension 12/22/2014   Hyperlipidemia 12/22/2014   Hypothyroidism 12/22/2014    Alanson Puls, PT DPT 04/28/2020, 2:09 PM  [place] MAIN [facility] SERVICES [place], [place], [address] Phone: [phone]   Fax:  [phone]  Name: Sydney Mcdaniel MRN: [id number] Date of Birth: 1924-11-07

## 2020-04-30 DIAGNOSIS — D1801 Hemangioma of skin and subcutaneous tissue: Secondary | ICD-10-CM | POA: Diagnosis not present

## 2020-04-30 DIAGNOSIS — D485 Neoplasm of uncertain behavior of skin: Secondary | ICD-10-CM | POA: Diagnosis not present

## 2020-04-30 DIAGNOSIS — L821 Other seborrheic keratosis: Secondary | ICD-10-CM | POA: Diagnosis not present

## 2020-05-03 ENCOUNTER — Other Ambulatory Visit: Payer: Self-pay

## 2020-05-03 ENCOUNTER — Inpatient Hospital Stay: Payer: Medicare Other | Attending: Hematology and Oncology

## 2020-05-03 ENCOUNTER — Inpatient Hospital Stay: Payer: Medicare Other | Admitting: Family Medicine

## 2020-05-03 ENCOUNTER — Encounter: Payer: Self-pay | Admitting: Family Medicine

## 2020-05-03 DIAGNOSIS — E538 Deficiency of other specified B group vitamins: Secondary | ICD-10-CM | POA: Insufficient documentation

## 2020-05-03 MED ORDER — CYANOCOBALAMIN 1000 MCG/ML IJ SOLN
1000.0000 ug | Freq: Once | INTRAMUSCULAR | Status: AC
  Administered 2020-05-03: 1000 ug via INTRAMUSCULAR
  Filled 2020-05-03: qty 1

## 2020-05-03 NOTE — Progress Notes (Signed)
Pt received B12 injection today per order. Tolerated well.

## 2020-05-05 ENCOUNTER — Other Ambulatory Visit: Payer: Self-pay

## 2020-05-05 ENCOUNTER — Ambulatory Visit: Payer: Medicare Other | Admitting: Physical Therapy

## 2020-05-05 ENCOUNTER — Encounter: Payer: Self-pay | Admitting: Physical Therapy

## 2020-05-05 DIAGNOSIS — M6281 Muscle weakness (generalized): Secondary | ICD-10-CM

## 2020-05-05 DIAGNOSIS — R262 Difficulty in walking, not elsewhere classified: Secondary | ICD-10-CM | POA: Diagnosis not present

## 2020-05-05 NOTE — Therapy (Signed)
[place] MAIN [facility] SERVICES [address] [place], [place], [address] Phone: [phone]   Fax:  [phone]  Physical Therapy Treatment  Patient Details  Name: Sydney Mcdaniel MRN: [id number] Date of Birth: [birth year] Referring Provider (PT): Delsa Grana   Encounter Date: 05/05/2020   PT End of Session - 05/05/20 1629    Visit Number 16    Number of Visits 17    Date for PT Re-Evaluation 06/16/20    PT Start Time 1093    PT Stop Time 1225    PT Time Calculation (min) 40 min    Equipment Utilized During Treatment Gait belt    Activity Tolerance Patient tolerated treatment well    Behavior During Therapy WFL for tasks assessed/performed           Past Medical History:  Diagnosis Date   Allergy    Anemia    Cataract    CLL (chronic lymphocytic leukemia) ([place])    Glaucoma    H/O: hysterectomy    Total   Hyperlipidemia    Hypertension    Hypothyroidism    Impaired fasting glucose    Lichen sclerosus    Osteoporosis    Hips   PAF (paroxysmal atrial fibrillation) ([place])    a. 06/15/2016 Event monitor: 4% afib burden; b. CHA2DS2VASc - 4-->Xarelto.   Syncope    a. 03/2016 Echo: EF55-60%, no rwma, mild AI/MR, nl PASP; b. 03/2016 48h Holter: no significant arrhythmias/pauses; c. 03/2016 MV: mild apical defect, likely breast attenuation, nl EF, low risk; d. 05/2016 Event monitor: No significant arrhythmia; e. 05/2016 Event monitor: PAF (4%).    Past Surgical History:  Procedure Laterality Date   APPENDECTOMY     EYE SURGERY     Glaucoma   TOTAL ABDOMINAL HYSTERECTOMY      There were no vitals filed for this visit.   Subjective Assessment - 05/05/20 1628    Subjective Patient reports no falls or LOB since last session. Presents with daughter.    Pertinent History Patient had HHPT 10 months ago. Before that she was in the hospital with BP issues over night. She was walking with spc outdoors, inside she holds  to the walls.    Limitations Walking;Standing    How long can you stand comfortably? 15 minutes    How long can you walk comfortably? 5 mins    Patient Stated Goals to have better balance and walk better    Currently in Pain? No/denies    Pain Score 0-No pain          Treatment: Gait training with walking sticks x 100 feet x 3 with sBA  Neuromuscular Training: Stool: staggered stance, head turns side/side, up/down x 5 reps each, each foot in front; VCs for proper technique and positioning for each exercise  Hurdle: Forward and backward stepping over hurdle x15 each direction, VCs to take big enough steps and to try to increase speed to work on coordination  Side stepping over hurdle 15x each direction  Blue Foam: Side stepping x10 on blue balance CGA for safety, VCs for taking a big enough step   BOSU: Lunge to BOSU ball x 10 , cues for going slow and to control the speed    Pt educated throughout session about proper posture and technique with exercises. Improved exercise technique, movement at target joints, use of target muscles after min to mod verbal, visual, tactile cues. CGA and Min to mod verbal cues used throughout with  increased in postural sway and LOB most seen with narrow base of support and while on uneven surfaces.                            PT Education - 05/05/20 1628    Education Details HEP    Person(s) Educated Patient    Methods Explanation    Comprehension Verbalized understanding            PT Short Term Goals - 04/21/20 1110      PT SHORT TERM GOAL #1   Title Patient (> 1 years old) will complete five times sit to stand test in < 15 seconds indicating an increased LE strength and improved balance.    Time 4    Period Weeks    Status On-going    Target Date 05/19/20             PT Long Term Goals - 04/21/20 1110      PT LONG TERM GOAL #1   Title Patient will increase BLE gross strength to 4+/5 as to improve  functional strength for independent gait, increased standing tolerance and increased ADL ability.    Baseline 8    Time 8    Period Weeks    Status New    Target Date 06/16/20      PT LONG TERM GOAL #2   Title Patient will increase Berg Balance score toi >45  to decrease falls risk.    Baseline @eval  28; 10/7: 39., 04/21/20=32/56    Time 8    Period Weeks    Status New    Target Date 06/16/20      PT LONG TERM GOAL #3   Title Patient will reduce timed up and go to <11 seconds to reduce fall risk and demonstrate improved transfer/gait ability.    Baseline At eval 27sec, 10/7: 27sec (some confusion about sitting at end) eschews SPC use, 04/21/20=27.28 sec    Time 8    Period Weeks    Status New    Target Date 06/16/20      PT LONG TERM GOAL #4   Title Patient will increase 10 meter walk test to >1.40m/s as to improve gait speed for better community ambulation and to reduce fall risk.    Baseline 48sec at eval; 10/7: 19sec self selected, 16sec fastest, 04/21/20/= .47 m/ sec    Time 8    Period Weeks    Status New    Target Date 06/16/20                 Plan - 05/05/20 1629    Clinical Impression Statement Patient instructed in beginning balance and coordination exercise. Patient required min VCs and  SBA for gait to improve weight shift and postural control. Patient requires min VCs to improve ankle stability  with gait.  Patients would benefit from additional skilled PT intervention to improve balance/gait safety and reduce fall risk.   Personal Factors and Comorbidities Age;Comorbidity 1;Comorbidity 2    Comorbidities HTN, osteoporosis, visual impairment, back pain, incontenence, Cancer    Examination-Activity Limitations Locomotion Level;Stairs    Stability/Clinical Decision Making Stable/Uncomplicated    Rehab Potential Good    PT Frequency 2x / week    PT Duration 8 weeks    PT Treatment/Interventions Neuromuscular re-education;Therapeutic exercise;Therapeutic  activities;Balance training;Functional mobility training;Gait training    PT Next Visit Plan FOTO; update HEP as needed    PT Home  Exercise Plan needs a revision, renovation, redux    Consulted and Agree with Plan of Care Patient;Family member/caregiver    Family Member Consulted DTR           Patient will benefit from skilled therapeutic intervention in order to improve the following deficits and impairments:  Abnormal gait, Decreased activity tolerance, Decreased endurance, Decreased knowledge of use of DME, Impaired flexibility, Difficulty walking, Decreased balance, Pain  Visit Diagnosis: Difficulty in walking, not elsewhere classified  Muscle weakness (generalized)     Problem List Patient Active Problem List   Diagnosis Date Noted   Hematochezia 04/11/2020   Acute colitis 04/11/2020   GI bleeding 04/11/2020   Iron deficiency anemia 03/08/2020   Goals of care, counseling/discussion 03/08/2020   Positive direct antiglobulin test (DAT) 12/04/2019   Sinus bradycardia 04/09/2019   First degree AV block 04/09/2019   Postural dizziness with presyncope 02/25/2019   Osteoporosis 08/21/2018   Osteoarthritis of left hip 05/21/2018   Bilateral carotid artery stenosis 04/19/2018   History of syncope 10/17/2017   MCI (mild cognitive impairment) 08/12/2017   Lung nodules 07/09/2017   Paroxysmal atrial fibrillation ([facility]) 04/18/2017   Pleural effusion 10/20/2016   Hyponatremia 10/20/2016   Chronic diastolic congestive heart failure ([place]) 06/28/2016   Primary open-angle glaucoma, bilateral, mild stage 05/08/2016   Labile blood glucose 04/27/2016   Bladder prolapse, female, acquired 02/25/2016   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   B12 deficiency 01/11/2016   Anemia 01/10/2016   CLL (chronic lymphocytic leukemia) ([place]) 01/10/2016   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 12/21/2015   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 12/21/2015    Bradycardia 12/21/2015   Lymphocytosis 06/25/2015   Impaired fasting glucose 06/22/2015   Carotid atherosclerosis 06/22/2015   Essential hypertension 12/22/2014   Hyperlipidemia 12/22/2014   Hypothyroidism 12/22/2014    Alanson Puls, PT DPT 05/05/2020, 4:30 PM  [place] MAIN [facility] SERVICES [place], [place], [address] Phone: [phone]   Fax:  [phone]  Name: NESHIA MCKENZIE MRN: [id number] Date of Birth: 05/30/1925

## 2020-05-10 ENCOUNTER — Other Ambulatory Visit: Payer: Self-pay

## 2020-05-10 ENCOUNTER — Ambulatory Visit: Payer: Medicare Other | Admitting: Physical Therapy

## 2020-05-10 ENCOUNTER — Encounter: Payer: Self-pay | Admitting: Physical Therapy

## 2020-05-10 DIAGNOSIS — M6281 Muscle weakness (generalized): Secondary | ICD-10-CM

## 2020-05-10 DIAGNOSIS — R262 Difficulty in walking, not elsewhere classified: Secondary | ICD-10-CM

## 2020-05-10 NOTE — Therapy (Signed)
[place] MAIN [facility] SERVICES [address] [place], [place], [address] Phone: [phone]   Fax:  [phone]  Physical Therapy Treatment  Patient Details  Name: Sydney Mcdaniel MRN: [id number] Date of Birth: 23-Aug-1924 Referring Provider (PT): Delsa Grana   Encounter Date: 05/10/2020   PT End of Session - 05/10/20 1309    Visit Number 17    Number of Visits 34    Date for PT Re-Evaluation 06/16/20    PT Start Time 1148    PT Stop Time 1230    PT Time Calculation (min) 42 min    Equipment Utilized During Treatment Gait belt    Activity Tolerance Patient tolerated treatment well    Behavior During Therapy [facility] for tasks assessed/performed           Past Medical History:  Diagnosis Date  . Allergy   . Anemia   . Cataract   . CLL (chronic lymphocytic leukemia) ([place])   . Glaucoma   . H/O: hysterectomy    Total  . Hyperlipidemia   . Hypertension   . Hypothyroidism   . Impaired fasting glucose   . Lichen sclerosus   . Osteoporosis    Hips  . PAF (paroxysmal atrial fibrillation) ([place])    a. 06/15/2016 Event monitor: 4% afib burden; b. CHA2DS2VASc - 4-->Xarelto.  . Syncope    a. 03/2016 Echo: EF55-60%, no rwma, mild AI/MR, nl PASP; b. 03/2016 48h Holter: no significant arrhythmias/pauses; c. 03/2016 MV: mild apical defect, likely breast attenuation, nl EF, low risk; d. 05/2016 Event monitor: No significant arrhythmia; e. 05/2016 Event monitor: PAF (4%).    Past Surgical History:  Procedure Laterality Date  . APPENDECTOMY    . EYE SURGERY     Glaucoma  . TOTAL ABDOMINAL HYSTERECTOMY      There were no vitals filed for this visit.   Subjective Assessment - 05/10/20 1309    Subjective Patient reports no falls or LOB since last session. Presents with daughter.    Pertinent History Patient had HHPT 10 months ago. Before that she was in the hospital with BP issues over night. She was walking with spc outdoors, inside she holds  to the walls.    Limitations Walking;Standing    How long can you stand comfortably? 15 minutes    How long can you walk comfortably? 5 mins    Patient Stated Goals to have better balance and walk better    Currently in Pain? No/denies    Pain Score 0-No pain             Neuromuscular Re-education   Tandem stand on floor without UE support x 2o feet Side stepping on floor without UE support x 20 feet Heel/toe raises without UE support 3s hold x 10 each 1/2 foam roll balance with flat side up 30s x 2 reps 1/2 foam roll balance with flat side down 30s x 2 reps Foam to 6 inch step x 10  Lateral side steps from foam to 6 inch stool left and right x 15 Backwards stepping from foam to 6 inch stool x 15  [place] fwd/bwd, side to side , diagonal x 10 ,cues for posture and stepping strategies, occasional LOB       Pt educated throughout session about proper posture and technique with exercises. Improved exercise technique, movement at target joints, use of target muscles after min to mod verbal, visual, tactile cues.  PT Education - 05/10/20 1309    Education Details HEP    Person(s) Educated Patient    Methods Explanation    Comprehension Verbalized understanding;Need further instruction            PT Short Term Goals - 04/21/20 1110      PT SHORT TERM GOAL #1   Title Patient (> 60 years old) will complete five times sit to stand test in < 15 seconds indicating an increased LE strength and improved balance.    Time 4    Period Weeks    Status On-going    Target Date 05/19/20             PT Long Term Goals - 04/21/20 1110      PT LONG TERM GOAL #1   Title Patient will increase BLE gross strength to 4+/5 as to improve functional strength for independent gait, increased standing tolerance and increased ADL ability.    Baseline 8    Time 8    Period Weeks    Status New    Target Date 06/16/20      PT LONG TERM GOAL #2    Title Patient will increase Berg Balance score toi >45  to decrease falls risk.    Baseline @eval  28; 10/7: 39., 04/21/20=32/56    Time 8    Period Weeks    Status New    Target Date 06/16/20      PT LONG TERM GOAL #3   Title Patient will reduce timed up and go to <11 seconds to reduce fall risk and demonstrate improved transfer/gait ability.    Baseline At eval 27sec, 10/7: 27sec (some confusion about sitting at end) eschews SPC use, 04/21/20=27.28 sec    Time 8    Period Weeks    Status New    Target Date 06/16/20      PT LONG TERM GOAL #4   Title Patient will increase 10 meter walk test to >1.66m/s as to improve gait speed for better community ambulation and to reduce fall risk.    Baseline 48sec at eval; 10/7: 19sec self selected, 16sec fastest, 04/21/20/= .47 m/ sec    Time 8    Period Weeks    Status New    Target Date 06/16/20                 Plan - 05/10/20 1310    Clinical Impression Statement  Pt was able to perform all exercises today with CGA.Marland Kitchen Pt was able to perform all balance and strength exercises, demonstrating improvements in LE strength and stability.  Pt was able to complete dynamic balance exercises, showing ability to stand on even surfaces with min assist and improve postural reactions to correct self during activities.  Pt requires verbal, visual and tactile cues during exercise in order to complete tasks with proper form and technique..  Pt would continue to benefit from skilled PT services in order to further strengthen LE's, improve static and dynamic balance, and improve coordination in order to increase functional mobility and decrease risk of falls    Personal Factors and Comorbidities Age;Comorbidity 1;Comorbidity 2    Comorbidities HTN, osteoporosis, visual impairment, back pain, incontenence, Cancer    Examination-Activity Limitations Locomotion Level;Stairs    Stability/Clinical Decision Making Stable/Uncomplicated    PT Frequency 2x / week     PT Duration 8 weeks    PT Treatment/Interventions Neuromuscular re-education;Therapeutic exercise;Therapeutic activities;Balance training;Functional mobility training;Gait training    Consulted and Agree  with Plan of Care Patient;Family member/caregiver           Patient will benefit from skilled therapeutic intervention in order to improve the following deficits and impairments:  Abnormal gait, Decreased activity tolerance, Decreased endurance, Decreased knowledge of use of DME, Impaired flexibility, Difficulty walking, Decreased balance, Pain  Visit Diagnosis: Difficulty in walking, not elsewhere classified  Muscle weakness (generalized)     Problem List Patient Active Problem List   Diagnosis Date Noted  . Hematochezia 04/11/2020  . Acute colitis 04/11/2020  . GI bleeding 04/11/2020  . Iron deficiency anemia 03/08/2020  . Goals of care, counseling/discussion 03/08/2020  . 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 Sinus bradycardia 04/09/2019  . First degree AV block 04/09/2019  . Postural dizziness with presyncope 02/25/2019  . Osteoporosis 08/21/2018  . Osteoarthritis of left hip 05/21/2018  . Bilateral carotid artery stenosis 04/19/2018  . History of syncope 10/17/2017  . MCI (mild cognitive impairment) 08/12/2017  . Lung nodules 07/09/2017  . Paroxysmal atrial fibrillation ([place]) 04/18/2017  . Pleural effusion 10/20/2016  . Hyponatremia 10/20/2016  . Chronic diastolic congestive heart failure ([place]) 06/28/2016  . Primary open-angle glaucoma, bilateral, mild stage 05/08/2016  . Labile blood glucose 04/27/2016  . Bladder prolapse, female, acquired 02/25/2016  . 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  . B12 deficiency 01/11/2016  . Anemia 01/10/2016  . CLL (chronic lymphocytic leukemia) ([place]) 01/10/2016  . 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 12/21/2015  . Bradycardia 12/21/2015  .  Lymphocytosis 06/25/2015  . Impaired fasting glucose 06/22/2015  . Carotid atherosclerosis 06/22/2015  . Essential hypertension 12/22/2014  . Hyperlipidemia 12/22/2014  . Hypothyroidism 12/22/2014    Alanson Puls, PT DPT 05/10/2020, 1:11 PM  [place] MAIN [facility] SERVICES [address] [place], [place], [address] Phone: [phone]   Fax:  [phone]  Name: Sydney Mcdaniel MRN: [id number] Date of Birth: [birth year]

## 2020-05-12 ENCOUNTER — Ambulatory Visit (INDEPENDENT_AMBULATORY_CARE_PROVIDER_SITE_OTHER): Payer: Medicare Other

## 2020-05-12 ENCOUNTER — Encounter: Payer: Self-pay | Admitting: Physical Therapy

## 2020-05-12 ENCOUNTER — Other Ambulatory Visit: Payer: Self-pay

## 2020-05-12 ENCOUNTER — Ambulatory Visit: Payer: Medicare Other | Admitting: Physical Therapy

## 2020-05-12 DIAGNOSIS — R262 Difficulty in walking, not elsewhere classified: Secondary | ICD-10-CM

## 2020-05-12 DIAGNOSIS — M6281 Muscle weakness (generalized): Secondary | ICD-10-CM

## 2020-05-12 DIAGNOSIS — R0989 Other specified symptoms and signs involving the circulatory and respiratory systems: Secondary | ICD-10-CM

## 2020-05-12 NOTE — Therapy (Signed)
[place] MAIN [facility] SERVICES [address] [place], [place], [address] Phone: [phone]   Fax:  [phone]  Physical Therapy Treatment  Patient Details  Name: Sydney Mcdaniel MRN: [id number] Date of Birth: July 22, 1924 Referring Provider (PT): Delsa Grana   Encounter Date: 05/12/2020   PT End of Session - 05/12/20 1150    Visit Number 18    Number of Visits 34    Date for PT Re-Evaluation 06/16/20    PT Start Time 6384    PT Stop Time 1225    PT Time Calculation (min) 40 min    Equipment Utilized During Treatment Gait belt    Activity Tolerance Patient tolerated treatment well    Behavior During Therapy WFL for tasks assessed/performed           Past Medical History:  Diagnosis Date  . Allergy   . Anemia   . Cataract   . CLL (chronic lymphocytic leukemia) ([place])   . Glaucoma   . H/O: hysterectomy    Total  . Hyperlipidemia   . Hypertension   . Hypothyroidism   . Impaired fasting glucose   . Lichen sclerosus   . Osteoporosis    Hips  . PAF (paroxysmal atrial fibrillation) ([place])    a. 06/15/2016 Event monitor: 4% afib burden; b. CHA2DS2VASc - 4-->Xarelto.  . Syncope    a. 03/2016 Echo: EF55-60%, no rwma, mild AI/MR, nl PASP; b. 03/2016 48h Holter: no significant arrhythmias/pauses; c. 03/2016 MV: mild apical defect, likely breast attenuation, nl EF, low risk; d. 05/2016 Event monitor: No significant arrhythmia; e. 05/2016 Event monitor: PAF (4%).    Past Surgical History:  Procedure Laterality Date  . APPENDECTOMY    . EYE SURGERY     Glaucoma  . TOTAL ABDOMINAL HYSTERECTOMY      There were no vitals filed for this visit.   Subjective Assessment - 05/12/20 1149    Subjective Patient reports no falls or LOB since last session. Presents with daughter.    Pertinent History Patient had HHPT 10 months ago. Before that she was in the hospital with BP issues over night. She was walking with spc outdoors, inside she holds  to the walls.    Limitations Walking;Standing    How long can you stand comfortably? 15 minutes    How long can you walk comfortably? 5 mins    Patient Stated Goals to have better balance and walk better    Currently in Pain? No/denies    Pain Score 0-No pain           Treatment: Nu-step x 5 mins Bridges x 10  SLR x 10 X 2 BLE Hip abd/Er x 30 Step fwd/ bwd x 15  Stepping over 1/2 foam x 15  Tapping stool from floor x 15  Step ups to 6 inch stool x 15  Gait training with walking sticks x 150 feet x 2   Patient performed with instruction, verbal cues, tactile cues of therapist: goal:increase tissue extensibility, promote proper posture, improve mobility                           PT Education - 05/12/20 1150    Education Details HEP    Person(s) Educated Patient    Methods Explanation    Comprehension Verbalized understanding;Need further instruction            PT Short Term Goals - 04/21/20 1110  PT SHORT TERM GOAL #1   Title Patient (> 37 years old) will complete five times sit to stand test in < 15 seconds indicating an increased LE strength and improved balance.    Time 4    Period Weeks    Status On-going    Target Date 05/19/20             PT Long Term Goals - 04/21/20 1110      PT LONG TERM GOAL #1   Title Patient will increase BLE gross strength to 4+/5 as to improve functional strength for independent gait, increased standing tolerance and increased ADL ability.    Baseline 8    Time 8    Period Weeks    Status New    Target Date 06/16/20      PT LONG TERM GOAL #2   Title Patient will increase Berg Balance score toi >45  to decrease falls risk.    Baseline @eval  28; 10/7: 39., 04/21/20=32/56    Time 8    Period Weeks    Status New    Target Date 06/16/20      PT LONG TERM GOAL #3   Title Patient will reduce timed up and go to <11 seconds to reduce fall risk and demonstrate improved transfer/gait ability.    Baseline  At eval 27sec, 10/7: 27sec (some confusion about sitting at end) eschews SPC use, 04/21/20=27.28 sec    Time 8    Period Weeks    Status New    Target Date 06/16/20      PT LONG TERM GOAL #4   Title Patient will increase 10 meter walk test to >1.70m/s as to improve gait speed for better community ambulation and to reduce fall risk.    Baseline 48sec at eval; 10/7: 19sec self selected, 16sec fastest, 04/21/20/= .47 m/ sec    Time 8    Period Weeks    Status New    Target Date 06/16/20                 Plan - 05/12/20 1150    Clinical Impression Statement Patient instructed in beginning balance and coordination exercise. Patient required mod VCs and min A for gait to improve weight shift and postural control. Patient requires min VCs to improve ankle heel toe pattern with gait.  Patients would benefit from additional skilled PT intervention to improve balance/gait safety and reduce fall risk.    Personal Factors and Comorbidities Age;Comorbidity 1;Comorbidity 2    Comorbidities HTN, osteoporosis, visual impairment, back pain, incontenence, Cancer    Examination-Activity Limitations Locomotion Level;Stairs    Stability/Clinical Decision Making Stable/Uncomplicated    PT Frequency 2x / week    PT Duration 8 weeks    PT Treatment/Interventions Neuromuscular re-education;Therapeutic exercise;Therapeutic activities;Balance training;Functional mobility training;Gait training    Consulted and Agree with Plan of Care Patient;Family member/caregiver           Patient will benefit from skilled therapeutic intervention in order to improve the following deficits and impairments:  Abnormal gait, Decreased activity tolerance, Decreased endurance, Decreased knowledge of use of DME, Impaired flexibility, Difficulty walking, Decreased balance, Pain  Visit Diagnosis: Muscle weakness (generalized)  Difficulty in walking, not elsewhere classified     Problem List Patient Active Problem List    Diagnosis Date Noted  . Hematochezia 04/11/2020  . Acute colitis 04/11/2020  . GI bleeding 04/11/2020  . Iron deficiency anemia 03/08/2020  . Goals of care, counseling/discussion 03/08/2020  . Positive  direct antiglobulin test (DAT) 12/04/2019  . Sinus bradycardia 04/09/2019  . First degree AV block 04/09/2019  . Postural dizziness with presyncope 02/25/2019  . Osteoporosis 08/21/2018  . Osteoarthritis of left hip 05/21/2018  . Bilateral carotid artery stenosis 04/19/2018  . History of syncope 10/17/2017  . MCI (mild cognitive impairment) 08/12/2017  . Lung nodules 07/09/2017  . Paroxysmal atrial fibrillation ([place]) 04/18/2017  . Pleural effusion 10/20/2016  . Hyponatremia 10/20/2016  . Chronic diastolic congestive heart failure ([place]) 06/28/2016  . Primary open-angle glaucoma, bilateral, mild stage 05/08/2016  . Labile blood glucose 04/27/2016  . Bladder prolapse, female, acquired 02/25/2016  . 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  . B12 deficiency 01/11/2016  . Anemia 01/10/2016  . CLL (chronic lymphocytic leukemia) ([place]) 01/10/2016  . 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 12/21/2015  . Bradycardia 12/21/2015  . Lymphocytosis 06/25/2015  . Impaired fasting glucose 06/22/2015  . Carotid atherosclerosis 06/22/2015  . Essential hypertension 12/22/2014  . Hyperlipidemia 12/22/2014  . Hypothyroidism 12/22/2014    Alanson Puls, PT DPT 05/12/2020, 11:51 AM  [place] MAIN [facility] SERVICES [address] [place], [place], [address] Phone: [phone]   Fax:  [phone]  Name: Sydney Mcdaniel MRN: [id number] Date of Birth: October 21, 1924

## 2020-05-13 ENCOUNTER — Other Ambulatory Visit: Payer: Self-pay | Admitting: Internal Medicine

## 2020-05-13 ENCOUNTER — Other Ambulatory Visit: Payer: Self-pay | Admitting: Family Medicine

## 2020-05-13 DIAGNOSIS — I480 Paroxysmal atrial fibrillation: Secondary | ICD-10-CM

## 2020-05-13 DIAGNOSIS — I951 Orthostatic hypotension: Secondary | ICD-10-CM

## 2020-05-13 DIAGNOSIS — I10 Essential (primary) hypertension: Secondary | ICD-10-CM

## 2020-05-13 DIAGNOSIS — E039 Hypothyroidism, unspecified: Secondary | ICD-10-CM

## 2020-05-13 NOTE — Telephone Encounter (Signed)
[name] the caregiver notified

## 2020-05-13 NOTE — Telephone Encounter (Signed)
Rx request sent to pharmacy.  

## 2020-05-17 ENCOUNTER — Other Ambulatory Visit: Payer: Medicare Other

## 2020-05-17 ENCOUNTER — Ambulatory Visit: Payer: Medicare Other | Admitting: Physical Therapy

## 2020-05-17 ENCOUNTER — Encounter: Payer: Self-pay | Admitting: Physical Therapy

## 2020-05-17 ENCOUNTER — Ambulatory Visit: Payer: Medicare Other | Admitting: Hematology and Oncology

## 2020-05-17 ENCOUNTER — Other Ambulatory Visit: Payer: Self-pay

## 2020-05-17 DIAGNOSIS — M6281 Muscle weakness (generalized): Secondary | ICD-10-CM

## 2020-05-17 DIAGNOSIS — R262 Difficulty in walking, not elsewhere classified: Secondary | ICD-10-CM

## 2020-05-17 NOTE — Therapy (Signed)
[place] MAIN [facility] SERVICES [address] [place], [place], [address] Phone: [phone]   Fax:  [phone]  Physical Therapy Treatment  Patient Details  Name: Sydney Mcdaniel MRN: [id number] Date of Birth: [birth year] Referring Provider (PT): Delsa Grana   Encounter Date: 05/17/2020   PT End of Session - 05/17/20 1257    Visit Number 19    Number of Visits 34    Date for PT Re-Evaluation 06/16/20    PT Start Time 4166    PT Stop Time 1230    PT Time Calculation (min) 45 min    Equipment Utilized During Treatment Gait belt    Activity Tolerance Patient tolerated treatment well    Behavior During Therapy [facility] for tasks assessed/performed           Past Medical History:  Diagnosis Date  . Allergy   . Anemia   . Cataract   . CLL (chronic lymphocytic leukemia) ([place])   . Glaucoma   . H/O: hysterectomy    Total  . Hyperlipidemia   . Hypertension   . Hypothyroidism   . Impaired fasting glucose   . Lichen sclerosus   . Osteoporosis    Hips  . PAF (paroxysmal atrial fibrillation) ([place])    a. 06/15/2016 Event monitor: 4% afib burden; b. CHA2DS2VASc - 4-->Xarelto.  . Syncope    a. 03/2016 Echo: EF55-60%, no rwma, mild AI/MR, nl PASP; b. 03/2016 48h Holter: no significant arrhythmias/pauses; c. 03/2016 MV: mild apical defect, likely breast attenuation, nl EF, low risk; d. 05/2016 Event monitor: No significant arrhythmia; e. 05/2016 Event monitor: PAF (4%).    Past Surgical History:  Procedure Laterality Date  . APPENDECTOMY    . EYE SURGERY     Glaucoma  . TOTAL ABDOMINAL HYSTERECTOMY      There were no vitals filed for this visit.   Subjective Assessment - 05/17/20 1257    Subjective Patient reports no falls or LOB since last session. Presents with daughter.    Pertinent History Patient had HHPT 10 months ago. Before that she was in the hospital with BP issues over night. She was walking with spc outdoors, inside she holds  to the walls.    Limitations Walking;Standing    How long can you stand comfortably? 15 minutes    How long can you walk comfortably? 5 mins    Patient Stated Goals to have better balance and walk better    Currently in Pain? No/denies    Pain Score 0-No pain          Treatment: Nu-step x 5 mins L2    Neuromuscular Re-education  Rocker board fwd/bwd, side to side x 20 each direction Step up to 6 inch stool x 20 Toe taps to 6 inch stool x 20  1/2 foam roll balance with flat side up 30s x 2 reps 1/2 foam roll balance with flat side down 30s x 2 reps 1/2 foam roll tandem balance alternating forward LE 30s x 2 each LE forward Lateral side steps from foam to 6 inch stool left and right x 15 Backwards stepping from foam to 6 inch stool x 15        Pt educated throughout session about proper posture and technique with exercises. Improved exercise technique, movement at target joints, use of target muscles after min to mod verbal, visual, tactile cues. CGA and Min to mod verbal cues used throughout with increased in postural sway and LOB most seen  with narrow base of support and while on uneven surfaces. Continues to have balance deficits typical with diagnosis. Patient performs intermediate level exercises without pain behaviors and needs verbal cuing for postural alignment and head positioning Tactile cues and assistance needed to keep lower leg and knee in neutral to avoid compensations with ankle motions.                         PT Education - 05/17/20 1257    Education Details HEP    Person(s) Educated Patient    Methods Explanation    Comprehension Verbalized understanding            PT Short Term Goals - 04/21/20 1110      PT SHORT TERM GOAL #1   Title Patient (> 84 years old) will complete five times sit to stand test in < 15 seconds indicating an increased LE strength and improved balance.    Time 4    Period Weeks    Status On-going    Target  Date 05/19/20             PT Long Term Goals - 04/21/20 1110      PT LONG TERM GOAL #1   Title Patient will increase BLE gross strength to 4+/5 as to improve functional strength for independent gait, increased standing tolerance and increased ADL ability.    Baseline 8    Time 8    Period Weeks    Status New    Target Date 06/16/20      PT LONG TERM GOAL #2   Title Patient will increase Berg Balance score toi >45  to decrease falls risk.    Baseline @eval  28; 10/7: 39., 04/21/20=32/56    Time 8    Period Weeks    Status New    Target Date 06/16/20      PT LONG TERM GOAL #3   Title Patient will reduce timed up and go to <11 seconds to reduce fall risk and demonstrate improved transfer/gait ability.    Baseline At eval 27sec, 10/7: 27sec (some confusion about sitting at end) eschews SPC use, 04/21/20=27.28 sec    Time 8    Period Weeks    Status New    Target Date 06/16/20      PT LONG TERM GOAL #4   Title Patient will increase 10 meter walk test to >1.27m/s as to improve gait speed for better community ambulation and to reduce fall risk.    Baseline 48sec at eval; 10/7: 19sec self selected, 16sec fastest, 04/21/20/= .47 m/ sec    Time 8    Period Weeks    Status New    Target Date 06/16/20                 Plan - 05/17/20 1258    Clinical Impression Statement Patient demonstrates deficits with postural control in  narrow stance on purple foam. Patient demonstrated hesitation with full weight shifting and on uneven surfaces but minimal cueing resulted in good technique and no LOB.  Patient improved ability to challenge dynamic balance with supervision and with UE assist today.   Patient will continue to benefit from skilled physical therapy to improve endurance and dynamic balance to reduce fall risk    Personal Factors and Comorbidities 86;Comorbidity 1;Comorbidity 2    Comorbidities HTN, osteoporosis, visual impairment, back pain, incontenence, Cancer     Examination-Activity Limitations Locomotion Level;Stairs    Stability/Clinical Decision  Making Stable/Uncomplicated    PT Frequency 2x / week    PT Duration 8 weeks    PT Treatment/Interventions Neuromuscular re-education;Therapeutic exercise;Therapeutic activities;Balance training;Functional mobility training;Gait training    Consulted and Agree with Plan of Care Patient;Family member/caregiver           Patient will benefit from skilled therapeutic intervention in order to improve the following deficits and impairments:  Abnormal gait, Decreased activity tolerance, Decreased endurance, Decreased knowledge of use of DME, Impaired flexibility, Difficulty walking, Decreased balance, Pain  Visit Diagnosis: Difficulty in walking, not elsewhere classified  Muscle weakness (generalized)     Problem List Patient Active Problem List   Diagnosis Date Noted  . Hematochezia 04/11/2020  . Acute colitis 04/11/2020  . GI bleeding 04/11/2020  . Iron deficiency anemia 03/08/2020  . Goals of care, counseling/discussion 03/08/2020  . 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 Sinus bradycardia 04/09/2019  . First degree AV block 04/09/2019  . Postural dizziness with presyncope 02/25/2019  . Osteoporosis 08/21/2018  . Osteoarthritis of left hip 05/21/2018  . Bilateral carotid artery stenosis 04/19/2018  . History of syncope 10/17/2017  . MCI (mild cognitive impairment) 08/12/2017  . Lung nodules 07/09/2017  . Paroxysmal atrial fibrillation ([place]) 04/18/2017  . Pleural effusion 10/20/2016  . Hyponatremia 10/20/2016  . Chronic diastolic congestive heart failure ([place]) 06/28/2016  . Primary open-angle glaucoma, bilateral, mild stage 05/08/2016  . Labile blood glucose 04/27/2016  . Bladder prolapse, female, acquired 02/25/2016  . 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  . B12 deficiency 01/11/2016  . Anemia 01/10/2016  . CLL (chronic lymphocytic leukemia) ([place]) 01/10/2016  . Neoplasm of  uncertain behavior of skin of ear 12/21/2015  . 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 12/21/2015  . Bradycardia 12/21/2015  . Lymphocytosis 06/25/2015  . Impaired fasting glucose 06/22/2015  . Carotid atherosclerosis 06/22/2015  . Essential hypertension 12/22/2014  . Hyperlipidemia 12/22/2014  . Hypothyroidism 12/22/2014    Alanson Puls, PT DPT 05/17/2020, 12:58 PM  [place] MAIN [facility] SERVICES [address] [place], [place], [address] Phone: [phone]   Fax:  [phone]  Name: Sydney Mcdaniel MRN: [id number] Date of Birth: [birth year]

## 2020-05-19 ENCOUNTER — Ambulatory Visit (INDEPENDENT_AMBULATORY_CARE_PROVIDER_SITE_OTHER): Payer: Medicare Other | Admitting: Family

## 2020-05-19 ENCOUNTER — Encounter: Payer: Self-pay | Admitting: Family

## 2020-05-19 ENCOUNTER — Telehealth: Payer: Self-pay | Admitting: Family

## 2020-05-19 ENCOUNTER — Other Ambulatory Visit: Payer: Self-pay

## 2020-05-19 ENCOUNTER — Ambulatory Visit: Payer: Medicare Other | Admitting: Physical Therapy

## 2020-05-19 ENCOUNTER — Encounter: Payer: Self-pay | Admitting: Physical Therapy

## 2020-05-19 VITALS — BP 120/52 | HR 56 | Ht 62.0 in | Wt 141.4 lb

## 2020-05-19 DIAGNOSIS — I480 Paroxysmal atrial fibrillation: Secondary | ICD-10-CM | POA: Diagnosis not present

## 2020-05-19 DIAGNOSIS — R262 Difficulty in walking, not elsewhere classified: Secondary | ICD-10-CM | POA: Diagnosis not present

## 2020-05-19 DIAGNOSIS — I5032 Chronic diastolic (congestive) heart failure: Secondary | ICD-10-CM | POA: Diagnosis not present

## 2020-05-19 DIAGNOSIS — I6523 Occlusion and stenosis of bilateral carotid arteries: Secondary | ICD-10-CM | POA: Diagnosis not present

## 2020-05-19 DIAGNOSIS — I10 Essential (primary) hypertension: Secondary | ICD-10-CM

## 2020-05-19 DIAGNOSIS — I951 Orthostatic hypotension: Secondary | ICD-10-CM

## 2020-05-19 DIAGNOSIS — M6281 Muscle weakness (generalized): Secondary | ICD-10-CM

## 2020-05-19 DIAGNOSIS — Z7901 Long term (current) use of anticoagulants: Secondary | ICD-10-CM

## 2020-05-19 DIAGNOSIS — R55 Syncope and collapse: Secondary | ICD-10-CM

## 2020-05-19 DIAGNOSIS — E871 Hypo-osmolality and hyponatremia: Secondary | ICD-10-CM

## 2020-05-19 MED ORDER — RIVAROXABAN 15 MG PO TABS: 15.0000 mg | ORAL_TABLET | Freq: Every day | ORAL | 3 refills | Status: DC

## 2020-05-19 MED ORDER — AMLODIPINE BESYLATE 5 MG PO TABS: 5.0000 mg | ORAL_TABLET | Freq: Every day | ORAL | 1 refills | Status: DC

## 2020-05-19 MED ORDER — LISINOPRIL 20 MG PO TABS: 20.0000 mg | ORAL_TABLET | Freq: Every day | ORAL | 1 refills | Status: DC

## 2020-05-19 NOTE — Patient Instructions (Signed)
Medication Instructions:  No medication changes today.   *If you need a refill on your cardiac medications before your next appointment, please call your pharmacy*   Lab Work: None ordered today.   Testing/Procedures: Your carotid duplex was stable compared to previous. Your Atorvastatin and Xarelto will help prevent this from progressing.   Follow-Up: At [facility], you and your health needs are our priority.  As part of our continuing mission to provide you with exceptional heart care, we have created designated Provider Care Teams.  These Care Teams include your primary Cardiologist (physician) and Advanced Practice Providers (APPs -  Physician Assistants and Nurse Practitioners) who all work together to provide you with the care you need, when you need it.  We recommend signing up for the patient portal called "MyChart".  Sign up information is provided on this After Visit Summary.  MyChart is used to connect with patients for Virtual Visits (Telemedicine).  Patients are able to view lab/test results, encounter notes, upcoming appointments, etc.  Non-urgent messages can be sent to your provider as well.   To learn more about what you can do with MyChart, go to [email or web address].    Your next appointment:   6 month(s)  The format for your next appointment:   In Person  Provider:   You may see Nelva Bush, MD or one of the following Advanced Practice Providers on your designated Care Team:    [name], NP  Christell Faith, PA-C  Marrianne Mood, PA-C  Cadence [place], [place]  Laurann [place], NP

## 2020-05-19 NOTE — Addendum Note (Signed)
Addended by: Loel Dubonnet on: 05/19/2020 04:52 PM   Modules accepted: Orders

## 2020-05-19 NOTE — Progress Notes (Addendum)
Office Visit    Patient Name: Sydney Mcdaniel Date of Encounter: 05/19/2020  Primary Care Provider:  Delsa Grana, PA-C Primary Cardiologist:  Nelva Bush, MD Electrophysiologist:  None   Chief Complaint    Sydney Mcdaniel is a 87 y.o. female with a hx of recurrent falls due to orthostatic hypotension and vasovagal syncope, paroxysmal atrial fibrillation, HFpEF, HTN, CLL, vitamin B12 deficiency, anemia presents today for follow-up after carotid duplex  Past Medical History    Past Medical History:  Diagnosis Date  . Allergy   . Anemia   . Cataract   . CLL (chronic lymphocytic leukemia) ([place])   . Glaucoma   . H/O: hysterectomy    Total  . Hyperlipidemia   . Hypertension   . Hypothyroidism   . Impaired fasting glucose   . Lichen sclerosus   . Osteoporosis    Hips  . PAF (paroxysmal atrial fibrillation) ([place])    a. 06/15/2016 Event monitor: 4% afib burden; b. CHA2DS2VASc - 4-->Xarelto.  . Syncope    a. 03/2016 Echo: EF55-60%, no rwma, mild AI/MR, nl PASP; b. 03/2016 48h Holter: no significant arrhythmias/pauses; c. 03/2016 MV: mild apical defect, likely breast attenuation, nl EF, low risk; d. 05/2016 Event monitor: No significant arrhythmia; e. 05/2016 Event monitor: PAF (4%).   Past Surgical History:  Procedure Laterality Date  . APPENDECTOMY    . EYE SURGERY     Glaucoma  . TOTAL ABDOMINAL HYSTERECTOMY      Allergies  Allergies  Allergen Reactions  . Other   . Alphagan [Brimonidine] Itching and Rash  . Pravachol [Pravastatin Sodium] Itching and Rash  . Pravastatin Itching, Other (See Comments) and Rash    History of Present Illness    Sydney Mcdaniel is a 86 y.o. female with a hx of  recurrent falls due to orthostatic hypotension and vasovagal syncope, paroxysmal atrial fibrillation, HFpEF, HTN, CLL, vitamin B12 deficiency, anemia, hyponatremia, carotid artery disease.  She was last seen 04/20/2020 by Elenor Quinones, PA.  She follows with Dr. Saunders Revel.   Longstanding history of syncope, orthostatic hypotension, vasovagal syncope.  Her timolol has previously been discontinued due to bradycardia though restarted due to worsening vision.  In January her lisinopril was increased due to uncontrolled blood pressure.  She was admitted 04/11/2020 after sudden onset of nausea, vomiting, lower abdominal pain, bloody diarrhea.  Diagnosed with likely food poisoning, acute GI bleed, focal colitis found on CT.  Xarelto was held and she was recommended to resume 2 days after discharge.  GI was consulted with no recommendation for EGD or colonoscopy.  She was seen in follow-up 04/20/2020.  She was doing overall well with exception of mild lower extremity edema which was stable compared to previous.  She reported periodic amaurosis fugax and left carotid bruit was noted, as such carotid duplex ordered.  Subsequent carotid duplex 05/12/2020 with 1-39% stenosis of R ICA and 40-59% of the left ICA.  Presents today for follow-up with her daughter.  We reviewed her carotid duplex in depth during clinic visit which is stable compared to previous imaging in 2019.  She reports no recurrent falls, syncope since last seen.  She has been working with physical therapy and is hopeful to continue to make progress.  She reports no chest pain, pressure, tightness.  Reports no shortness of breath at rest or dyspnea on exertion.  She is excited for the Thanksgiving holiday and plans to make to continue comfortable.  EKGs/Labs/Other Studies Reviewed:   The  following studies were reviewed today: Carotid duplex 05/12/2020    Summary:  Right Carotid: Velocities in the right ICA are consistent with a 1-39%  stenosis.                Non-hemodynamically significant plaque <50% noted in the  CCA. The                 ECA appears <50% stenosed.   Left Carotid: Velocities in the left ICA are consistent with a 40-59%  stenosis.               Non-hemodynamically significant plaque <50% noted  in the  CCA. The                ECA appears <50% stenosed.   Vertebrals:  Bilateral vertebral arteries demonstrate antegrade flow.  Subclavians: Normal flow hemodynamics were seen in bilateral subclavian               arteries.   EKG:  No EKG is  ordered today.  The ekg independently reviewed from 04/20/2020 demonstrated sinus rhythm 67 bpm with stable first-degree AV block and known LBBB.  Recent Labs: 10/21/2019: TSH 3.03 04/11/2020: ALT 18 04/13/2020: Magnesium 2.0 04/20/2020: BUN 11; Creatinine, Ser 0.76; Hemoglobin 10.1; Platelets 260; Potassium 4.2; Sodium 136  Recent Lipid Panel    Component Value Date/Time   CHOL 153 07/30/2019 0925   CHOL 141 08/24/2015 0856   TRIG 108 07/30/2019 0925   HDL 50 07/30/2019 0925   HDL 52 08/24/2015 0856   CHOLHDL 3.1 07/30/2019 0925   VLDL 22 07/30/2019 0925   LDLCALC 81 07/30/2019 0925   LDLCALC 90 07/02/2018 1040   Risk Assessment/Calculations:   CHA2DS2-VASc Score = 5  This indicates a 7.2% annual risk of stroke. The patient's score is based upon: CHF History: 1 HTN History: 1 Diabetes History: 0 Stroke History: 0 Vascular Disease History: 0 Age Score: 2 Gender Score: 1    Home Medications   Current Meds  Medication Sig  . [START ON 06/08/2020] alendronate (FOSAMAX) 70 MG tablet TAKE ONE TABLET BY MOUTH EVERY 7 DAYS. TAKE WITH A FULL GLASS OF WATER ON AN EMPTY STOMACH. (Patient taking differently: Take 70 mg by mouth every Friday. TAKE ONE TABLET BY MOUTH EVERY 7 DAYS. TAKE WITH A FULL GLASS OF WATER ON AN EMPTY STOMACH.)  . amLODipine (NORVASC) 5 MG tablet Take 1 tablet (5 mg total) by mouth daily.  Marland Kitchen atorvastatin (LIPITOR) 20 MG tablet Take 1 tablet (20 mg total) by mouth at bedtime.  . Calcium Carb-Cholecalciferol (CALCIUM 600 + D PO) Take 1 tablet by mouth daily.   . cholecalciferol (VITAMIN D) 1000 units tablet Take 1,000 Units by mouth daily.  . Cyanocobalamin (B-12) 1000 MCG/ML KIT Inject 1,000 mcg as directed every  30 (thirty) days.  . famotidine (PEPCID) 20 MG tablet Take 20 mg by mouth daily.  . ferrous sulfate 325 (65 FE) MG tablet Take 325 mg by mouth 2 (two) times daily with a meal.  . latanoprost (XALATAN) 0.005 % ophthalmic solution Place 1 drop into the right eye at bedtime.   Marland Kitchen levothyroxine (EUTHYROX) 75 MCG tablet TAKE ONE TABLET BY MOUTH ON MONDAYS, WEDNESDAYS, FRIDAYS, AND SUNDAYS (Patient taking differently: Take 75 mcg by mouth as directed. TAKE ONE TABLET BY MOUTH ON MONDAYS, WEDNESDAYS, FRIDAYS, AND SUNDAYS)  . levothyroxine (EUTHYROX) 88 MCG tablet TAKE 1 TABLET BY MOUTH ON TUESDAYS, THURSDAYS, AND SATURDAYS (Patient taking differently: Take 88 mcg by mouth  as directed. TAKE 1 TABLET BY MOUTH ON TUESDAYS, THURSDAYS, AND SATURDAYS)  . lisinopril (ZESTRIL) 20 MG tablet Take 1 tablet (20 mg total) by mouth daily.  . Multiple Vitamins-Minerals (MULTIVITAMIN ADULT PO) Take by mouth.  . Omega-3 Fatty Acids (FISH OIL) 1200 MG CAPS Take 1,200 mg by mouth daily.   . rivaroxaban (XARELTO) 20 MG TABS tablet Take 1 tablet (20 mg total) by mouth daily with supper.  . timolol (TIMOPTIC) 0.5 % ophthalmic solution Place 1 drop into the right eye 2 (two) times daily.   . [DISCONTINUED] amLODipine (NORVASC) 5 MG tablet Take 1 tablet by mouth once daily  . [DISCONTINUED] lisinopril (ZESTRIL) 20 MG tablet Take 20 mg by mouth daily.     Review of Systems  All other systems reviewed and are otherwise negative except as noted above.  Physical Exam    VS:  BP (!) 120/52 (BP Location: Left Arm, Patient Position: Sitting, Cuff Size: Normal)   Pulse (!) 56   Ht '5\' 2"'  (1.575 m)   Wt 141 lb 6.4 oz (64.1 kg)   LMP  (LMP Unknown)   BMI 25.86 kg/m  , BMI Body mass index is 25.86 kg/m.  Wt Readings from Last 3 Encounters:  05/19/20 141 lb 6.4 oz (64.1 kg)  04/20/20 139 lb 4 oz (63.2 kg)  04/13/20 142 lb 10.2 oz (64.7 kg)    GEN: Well nourished, well developed, in no acute distress. HEENT: normal. Neck:  Supple, no JVD, carotid bruits, or masses. Cardiac: RRR, no murmurs, rubs, or gallops. No clubbing, cyanosis, edema.  Radials/DP/PT 2+ and equal bilaterally.  Respiratory:  Respirations regular and unlabored, clear to auscultation bilaterally. GI: Soft, nontender, nondistended. MS: No deformity or atrophy. Skin: Warm and dry, no rash. Neuro:  Strength and sensation are intact. Psych: Normal affect.  Assessment & Plan    1. HTN/orthostatic hypotension- BP well controlled. Continue current antihypertensive regimen. Refills provided.  2. Recurrent syncope secondary to orthostatic hypotension and vasovagal syncope-denies recurrent syncope.  BP well controlled.  Encouraged to make position changes slowly, utilize compression stockings.  3. PAF/chronic anticoagulation/LBBB/First degree AV block - Denies palpitations. Heart regular rate & rhythm today. Timolol previously discontinued due to bradycardia though with worsening vision was resumed.  Denies bleeding complications on Xarelto 48m daily. Her creatinine clearance is borderline at 48. Discussed with primary cardiologist, reduce Xarelto to 143monce daily as creatinine clearance <50.  4. Chronic HFpEF- Euvolemic and well compensated on exam.  GDMT includes lisinopril.  5. Hyponatremia- Sodium normalized by lab work 10/26.  Continue fluid restrict less than 2 L/day.  6. Bilateral carotid artery stenosis-carotid duplex 11/70/21 with left ICA 40-59% stenosed and right ICA 1-39% stenosed.  Stable compared to previous study 2019.  Consider repeat imaging in 1 year.  Continue atorvastatin.  Disposition: Follow up in 6 month(s) with Dr. EnSaunders Revelr APP  Signed, CaLoel DubonnetNP 05/19/2020, 10:10 AM [place]

## 2020-05-19 NOTE — Telephone Encounter (Signed)
Case discussed with Dr. Saunders Revel.  Due to creatinine clearance less than 50, will transition Sydney Mcdaniel Xarelto to 15 mg daily to prevent bleeding.  Called her daughter cell phone per DPR left detailed voicemail.  Will additionally route my chart message with explanation.  Encouraged her to call back with any questions.  Have sent prescription for Xarelto 15 milligrams daily to her pharmacy.  Loel Dubonnet, NP

## 2020-05-19 NOTE — Therapy (Signed)
[place] MAIN [facility] SERVICES [address] [place], [place], [address] Phone: [phone]   Fax:  [phone]  Physical Therapy Treatment / Physical Therapy Progress Note   Dates of reporting period  04/21/20   to 05/19/20  Patient Details  Name: Sydney Mcdaniel MRN: [id number] Date of Birth: 06-16-1925 Referring Provider (PT): Delsa Grana   Encounter Date: 05/19/2020   PT End of Session - 05/19/20 1453    Visit Number 20    Number of Visits 34    Date for PT Re-Evaluation 06/16/20    PT Start Time 2330    PT Stop Time 1230    PT Time Calculation (min) 45 min    Equipment Utilized During Treatment Gait belt    Activity Tolerance Patient tolerated treatment well    Behavior During Therapy WFL for tasks assessed/performed           Past Medical History:  Diagnosis Date  . Allergy   . Anemia   . Cataract   . CLL (chronic lymphocytic leukemia) ([place])   . Glaucoma   . H/O: hysterectomy    Total  . Hyperlipidemia   . Hypertension   . Hypothyroidism   . Impaired fasting glucose   . Lichen sclerosus   . Osteoporosis    Hips  . PAF (paroxysmal atrial fibrillation) ([place])    a. 06/15/2016 Event monitor: 4% afib burden; b. CHA2DS2VASc - 4-->Xarelto.  . Syncope    a. 03/2016 Echo: EF55-60%, no rwma, mild AI/MR, nl PASP; b. 03/2016 48h Holter: no significant arrhythmias/pauses; c. 03/2016 MV: mild apical defect, likely breast attenuation, nl EF, low risk; d. 05/2016 Event monitor: No significant arrhythmia; e. 05/2016 Event monitor: PAF (4%).    Past Surgical History:  Procedure Laterality Date  . APPENDECTOMY    . EYE SURGERY     Glaucoma  . TOTAL ABDOMINAL HYSTERECTOMY      There were no vitals filed for this visit.   Subjective Assessment - 05/19/20 1452    Subjective Patient reports no falls or LOB since last session. Presents with daughter.    Pertinent History Patient had HHPT 10 months ago. Before that she was in the  hospital with BP issues over night. She was walking with spc outdoors, inside she holds to the walls.    Limitations Walking;Standing    How long can you stand comfortably? 15 minutes    How long can you walk comfortably? 5 mins    Patient Stated Goals to have better balance and walk better    Currently in Pain? No/denies    Pain Score 0-No pain              [facility] PT Assessment - 05/19/20 0001      Berg Balance Test   Sit to Stand Able to stand  independently using hands    Standing Unsupported Able to stand 2 minutes with supervision    Sitting with Back Unsupported but Feet Supported on Floor or Stool Able to sit safely and securely 2 minutes    Stand to Sit Sits safely with minimal use of hands    Transfers Able to transfer safely, minor use of hands    Standing Unsupported with Eyes Closed Able to stand 10 seconds safely    Standing Unsupported with Feet Together Able to place feet together independently and stand for 1 minute with supervision    From Standing, Reach Forward with Outstretched Arm Can reach confidently >25 cm (  10") (P)     From Standing Position, Pick up Object from [place] to pick up shoe, needs supervision (P)     From Standing Position, Turn to Look Behind Over each Shoulder Looks behind one side only/other side shows less weight shift (P)     Turn 360 Degrees Able to turn 360 degrees safely in 4 seconds or less (P)     Standing Unsupported, Alternately Place Feet on Step/Stool Able to stand independently and safely and complete 8 steps in 20 seconds (P)     Standing Unsupported, One Foot in [place] to take small step independently and hold 30 seconds    Standing on One Leg Unable to try or needs assist to prevent fall    Total Score 45 (P)             Treatment: Outcome measures and berg balance performed and goals reviewed.                     PT Education - 05/19/20 1452    Education Details goals    Person(s) Educated  Patient    Methods Explanation    Comprehension Verbalized understanding            PT Short Term Goals - 05/19/20 1457      PT SHORT TERM GOAL #1   Title Patient (> 38 years old) will complete five times sit to stand test in < 15 seconds indicating an increased LE strength and improved balance.    Time 4    Period Weeks    Status Partially Met    Target Date 05/19/20             PT Long Term Goals - 05/19/20 1159      PT LONG TERM GOAL #1   Title Patient will increase BLE gross strength to 4+/5 as to improve functional strength for independent gait, increased standing tolerance and increased ADL ability.    Baseline 8    Time 8    Period Weeks    Status Partially Met    Target Date 06/16/20      PT LONG TERM GOAL #2   Title Patient will increase Berg Balance score toi >45  to decrease falls risk.    Baseline '@eval'  28; 10/7: 39., 04/21/20=32/56    Time 8    Period Weeks    Status Partially Met    Target Date 06/16/20      PT LONG TERM GOAL #3   Title Patient will reduce timed up and go to <11 seconds to reduce fall risk and demonstrate improved transfer/gait ability.    Baseline At eval 27sec, 10/7: 27sec (some confusion about sitting at end) eschews SPC use, 04/21/20=27.28 sec, 05/19/20=16.13sec    Time 8    Period Weeks    Status Partially Met    Target Date 06/16/20      PT LONG TERM GOAL #4   Title Patient will increase 10 meter walk test to >1.97ms as to improve gait speed for better community ambulation and to reduce fall risk.    Baseline 48sec at eval; 10/7: 19sec self selected, 16sec fastest, 04/21/20/= .47 m/ sec, 05/19/20=.729mec    Time 8    Period Weeks    Status Partially Met    Target Date 06/16/20                 Plan - 05/19/20 1453    Clinical Impression Statement Patient's  condition has the potential to improve in response to therapy. Maximum improvement is yet to be obtained. The anticipated improvement is attainable and  reasonable in a generally predictable time.  Patient reports that her functional mobility depends on how active she is during the day. Her outcome measures improved and  goals were reviewed and progressed. Patient will continue to benefit from skilled PT to improve mobility.    Personal Factors and Comorbidities Age;Comorbidity 1;Comorbidity 2    Comorbidities HTN, osteoporosis, visual impairment, back pain, incontenence, Cancer    Examination-Activity Limitations Locomotion Level;Stairs    Stability/Clinical Decision Making Stable/Uncomplicated    PT Frequency 2x / week    PT Duration 8 weeks    PT Treatment/Interventions Neuromuscular re-education;Therapeutic exercise;Therapeutic activities;Balance training;Functional mobility training;Gait training    Consulted and Agree with Plan of Care Patient;Family member/caregiver           Patient will benefit from skilled therapeutic intervention in order to improve the following deficits and impairments:  Abnormal gait, Decreased activity tolerance, Decreased endurance, Decreased knowledge of use of DME, Impaired flexibility, Difficulty walking, Decreased balance, Pain  Visit Diagnosis: Muscle weakness (generalized)  Difficulty in walking, not elsewhere classified     Problem List Patient Active Problem List   Diagnosis Date Noted  . Hematochezia 04/11/2020  . Acute colitis 04/11/2020  . GI bleeding 04/11/2020  . Iron deficiency anemia 03/08/2020  . Goals of care, counseling/discussion 03/08/2020  . 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 Sinus bradycardia 04/09/2019  . First degree AV block 04/09/2019  . Postural dizziness with presyncope 02/25/2019  . Osteoporosis 08/21/2018  . Osteoarthritis of left hip 05/21/2018  . Bilateral carotid artery stenosis 04/19/2018  . History of syncope 10/17/2017  . MCI (mild cognitive impairment) 08/12/2017  . Lung nodules 07/09/2017  . Paroxysmal atrial fibrillation ([place])  04/18/2017  . Pleural effusion 10/20/2016  . Hyponatremia 10/20/2016  . Chronic diastolic congestive heart failure ([place]) 06/28/2016  . Primary open-angle glaucoma, bilateral, mild stage 05/08/2016  . Labile blood glucose 04/27/2016  . Bladder prolapse, female, acquired 02/25/2016  . 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  . B12 deficiency 01/11/2016  . Anemia 01/10/2016  . CLL (chronic lymphocytic leukemia) ([place]) 01/10/2016  . 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 12/21/2015  . Bradycardia 12/21/2015  . Lymphocytosis 06/25/2015  . Impaired fasting glucose 06/22/2015  . Carotid atherosclerosis 06/22/2015  . Essential hypertension 12/22/2014  . Hyperlipidemia 12/22/2014  . Hypothyroidism 12/22/2014    Alanson Puls, PT DPT 05/19/2020, 2:57 PM  [place] MAIN [facility] SERVICES [address] [place], [place], [address] Phone: [phone]   Fax:  [phone]  Name: MYCALA WARSHAWSKY MRN: [id number] Date of Birth: August 13, 1924

## 2020-05-24 ENCOUNTER — Ambulatory Visit: Payer: Medicare Other

## 2020-05-24 ENCOUNTER — Other Ambulatory Visit: Payer: Self-pay

## 2020-05-24 DIAGNOSIS — R262 Difficulty in walking, not elsewhere classified: Secondary | ICD-10-CM | POA: Diagnosis not present

## 2020-05-24 DIAGNOSIS — M6281 Muscle weakness (generalized): Secondary | ICD-10-CM

## 2020-05-24 NOTE — Therapy (Signed)
[place] MAIN [facility] SERVICES [address] [place], [place], [address] Phone: [phone]   Fax:  [phone]  Physical Therapy Treatment  Patient Details  Name: Sydney Mcdaniel MRN: [id number] Date of Birth: 1924-07-27 Referring Provider (PT): Delsa Grana   Encounter Date: 05/24/2020   PT End of Session - 05/24/20 1308    Visit Number 21    Number of Visits 34    Date for PT Re-Evaluation 06/16/20    PT Start Time 1300    PT Stop Time 1344    PT Time Calculation (min) 44 min    Equipment Utilized During Treatment Gait belt    Activity Tolerance Patient tolerated treatment well    Behavior During Therapy [facility] for tasks assessed/performed           Past Medical History:  Diagnosis Date  . Allergy   . Anemia   . Cataract   . CLL (chronic lymphocytic leukemia) ([place])   . Glaucoma   . H/O: hysterectomy    Total  . Hyperlipidemia   . Hypertension   . Hypothyroidism   . Impaired fasting glucose   . Lichen sclerosus   . Osteoporosis    Hips  . PAF (paroxysmal atrial fibrillation) ([place])    a. 06/15/2016 Event monitor: 4% afib burden; b. CHA2DS2VASc - 4-->Xarelto.  . Syncope    a. 03/2016 Echo: EF55-60%, no rwma, mild AI/MR, nl PASP; b. 03/2016 48h Holter: no significant arrhythmias/pauses; c. 03/2016 MV: mild apical defect, likely breast attenuation, nl EF, low risk; d. 05/2016 Event monitor: No significant arrhythmia; e. 05/2016 Event monitor: PAF (4%).    Past Surgical History:  Procedure Laterality Date  . APPENDECTOMY    . EYE SURGERY     Glaucoma  . TOTAL ABDOMINAL HYSTERECTOMY      There were no vitals filed for this visit.   Subjective Assessment - 05/24/20 1306    Subjective Patient was anxious about stepping up two steps without railing with assistance. Was able to do it with daughters help. No falls or LOB since last session.    Pertinent History Patient had HHPT 10 months ago. Before that she was in the hospital  with BP issues over night. She was walking with spc outdoors, inside she holds to the walls.    Limitations Walking;Standing    How long can you stand comfortably? 15 minutes    How long can you walk comfortably? 5 mins    Patient Stated Goals to have better balance and walk better    Currently in Pain? No/denies              Ther-ex  Nu-step L3 x 4 minutes for cardiovascular challenge Sumo squat with UE support on bar in front, cues for depth of squat 10x #3 ankle weights: seated LAQ kicking soccer ball for coordination, strengthening of LE's, and timing of muscle contraction x 2 minutes #3 ankle weight marches to PT hand 15x each LE keeping back away from back of chair for core activation   4" step step up/downs 10x each LE  Neuromuscular Re-education by support bar:    orange hurdle lateral step-overs with UE support x 10 leading with each leg; Orange hurdle forward step over and back with SUE support 10x each LE  airex pad: throw rainbow ball (underhand) 15x airex pad: rainbow ball bounce pad 15x no LOB  Modified single limb stance: soccer ball under foot 30 seconds x 2 trials each LE ;  no UE support  Static stand and march without UE support 10x each LE, close CGA   Patient performed with instruction, verbal cues, tactile cues of therapist: goal: increase tissue extensibility, promote proper posture, improve mobility   vitals monitored throughout session.   Patient demonstrates excellent motivation throughout physical therapy session and is progressing with functional strength and mobility. Single limb stability is challenging to patient but she is increasing her ability to retain COM with decreased UE support. Patient will continue to benefit from skilled physical therapy to improve endurance and dynamic balance to reduce fall risk                         PT Education - 05/24/20 1307    Education Details exercise technique, body mechanics    Person(s)  Educated Patient;Child(ren)    Methods Explanation;Demonstration;Tactile cues;Verbal cues    Comprehension Verbalized understanding;Returned demonstration;Verbal cues required;Tactile cues required            PT Short Term Goals - 05/19/20 1457      PT SHORT TERM GOAL #1   Title Patient (> 65 years old) will complete five times sit to stand test in < 15 seconds indicating an increased LE strength and improved balance.    Time 4    Period Weeks    Status Partially Met    Target Date 05/19/20             PT Long Term Goals - 05/19/20 1159      PT LONG TERM GOAL #1   Title Patient will increase BLE gross strength to 4+/5 as to improve functional strength for independent gait, increased standing tolerance and increased ADL ability.    Baseline 8    Time 8    Period Weeks    Status Partially Met    Target Date 06/16/20      PT LONG TERM GOAL #2   Title Patient will increase Berg Balance score toi >45  to decrease falls risk.    Baseline _0  28; 10/7: 39., 04/21/20=32/56    Time 8    Period Weeks    Status Partially Met    Target Date 06/16/20      PT LONG TERM GOAL #3   Title Patient will reduce timed up and go to <11 seconds to reduce fall risk and demonstrate improved transfer/gait ability.    Baseline At eval 27sec, 10/7: 27sec (some confusion about sitting at end) eschews SPC use, 04/21/20=27.28 sec, 05/19/20=16.13sec    Time 8    Period Weeks    Status Partially Met    Target Date 06/16/20      PT LONG TERM GOAL #4   Title Patient will increase 10 meter walk test to >1.62ms as to improve gait speed for better community ambulation and to reduce fall risk.    Baseline 48sec at eval; 10/7: 19sec self selected, 16sec fastest, 04/21/20/= .47 m/ sec, 05/19/20=.[id number]    Time 8    Period Weeks    Status Partially Met    Target Date 06/16/20                 Plan - 05/24/20 1802    Clinical Impression Statement Patient demonstrates excellent motivation  throughout physical therapy session and is progressing with functional strength and mobility. Single limb stability is challenging to patient but she is increasing her ability to retain COM with decreased UE support. Patient will continue to benefit from  skilled physical therapy to improve endurance and dynamic balance to reduce fall risk    Personal Factors and Comorbidities Age;Comorbidity 1;Comorbidity 2    Comorbidities HTN, osteoporosis, visual impairment, back pain, incontenence, Cancer    Examination-Activity Limitations Locomotion Level;Stairs    Stability/Clinical Decision Making Stable/Uncomplicated    PT Frequency 2x / week    PT Duration 8 weeks    PT Treatment/Interventions Neuromuscular re-education;Therapeutic exercise;Therapeutic activities;Balance training;Functional mobility training;Gait training    Consulted and Agree with Plan of Care Patient;Family member/caregiver           Patient will benefit from skilled therapeutic intervention in order to improve the following deficits and impairments:  Abnormal gait, Decreased activity tolerance, Decreased endurance, Decreased knowledge of use of DME, Impaired flexibility, Difficulty walking, Decreased balance, Pain  Visit Diagnosis: Muscle weakness (generalized)  Difficulty in walking, not elsewhere classified     Problem List Patient Active Problem List   Diagnosis Date Noted  . Hematochezia 04/11/2020  . Acute colitis 04/11/2020  . GI bleeding 04/11/2020  . Iron deficiency anemia 03/08/2020  . Goals of care, counseling/discussion 03/08/2020  . 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 Sinus bradycardia 04/09/2019  . First degree AV block 04/09/2019  . Postural dizziness with presyncope 02/25/2019  . Osteoporosis 08/21/2018  . Osteoarthritis of left hip 05/21/2018  . Bilateral carotid artery stenosis 04/19/2018  . History of syncope 10/17/2017  . MCI (mild cognitive impairment) 08/12/2017  . Lung  nodules 07/09/2017  . Paroxysmal atrial fibrillation ([place]) 04/18/2017  . Pleural effusion 10/20/2016  . Hyponatremia 10/20/2016  . Chronic diastolic congestive heart failure ([place]) 06/28/2016  . Primary open-angle glaucoma, bilateral, mild stage 05/08/2016  . Labile blood glucose 04/27/2016  . Bladder prolapse, female, acquired 02/25/2016  . 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  . B12 deficiency 01/11/2016  . Anemia 01/10/2016  . CLL (chronic lymphocytic leukemia) ([place]) 01/10/2016  . 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 12/21/2015  . Bradycardia 12/21/2015  . Lymphocytosis 06/25/2015  . Impaired fasting glucose 06/22/2015  . Carotid atherosclerosis 06/22/2015  . Essential hypertension 12/22/2014  . Hyperlipidemia 12/22/2014  . Hypothyroidism 12/22/2014   Janna Arch, PT, DPT   05/24/2020, 6:03 PM  [place] MAIN [facility] SERVICES [address] [place], [place], [address] Phone: [phone]   Fax:  [phone]  Name: Sydney Mcdaniel MRN: [id number] Date of Birth: 1924-09-22

## 2020-05-26 ENCOUNTER — Other Ambulatory Visit: Payer: Self-pay

## 2020-05-26 ENCOUNTER — Ambulatory Visit: Payer: Medicare Other | Attending: Family Medicine

## 2020-05-26 DIAGNOSIS — M6281 Muscle weakness (generalized): Secondary | ICD-10-CM | POA: Insufficient documentation

## 2020-05-26 DIAGNOSIS — R262 Difficulty in walking, not elsewhere classified: Secondary | ICD-10-CM | POA: Insufficient documentation

## 2020-05-26 NOTE — Therapy (Signed)
[place] MAIN [facility] SERVICES [address] [place], [place], [address] Phone: [phone]   Fax:  [phone]  Physical Therapy Treatment  Patient Details  Name: Sydney Mcdaniel MRN: [id number] Date of Birth: 1925/02/13 Referring Provider (PT): Delsa Grana   Encounter Date: 05/26/2020   PT End of Session - 05/26/20 1638    Visit Number 22    Number of Visits 34    Date for PT Re-Evaluation 06/16/20    PT Start Time 1300    PT Stop Time 1343    PT Time Calculation (min) 43 min    Equipment Utilized During Treatment Gait belt    Activity Tolerance Patient tolerated treatment well    Behavior During Therapy [facility] for tasks assessed/performed           Past Medical History:  Diagnosis Date  . Allergy   . Anemia   . Cataract   . CLL (chronic lymphocytic leukemia) ([place])   . Glaucoma   . H/O: hysterectomy    Total  . Hyperlipidemia   . Hypertension   . Hypothyroidism   . Impaired fasting glucose   . Lichen sclerosus   . Osteoporosis    Hips  . PAF (paroxysmal atrial fibrillation) ([place])    a. 06/15/2016 Event monitor: 4% afib burden; b. CHA2DS2VASc - 4-->Xarelto.  . Syncope    a. 03/2016 Echo: EF55-60%, no rwma, mild AI/MR, nl PASP; b. 03/2016 48h Holter: no significant arrhythmias/pauses; c. 03/2016 MV: mild apical defect, likely breast attenuation, nl EF, low risk; d. 05/2016 Event monitor: No significant arrhythmia; e. 05/2016 Event monitor: PAF (4%).    Past Surgical History:  Procedure Laterality Date  . APPENDECTOMY    . EYE SURGERY     Glaucoma  . TOTAL ABDOMINAL HYSTERECTOMY      There were no vitals filed for this visit.          Subjective Assessment - 05/26/20 1256    Subjective Patient reports feeling good today and is interested in walking outside as part of her therapy today.    Patient is accompained by: Family member    Pertinent History Patient had HHPT 10 months ago. Before that she was in the  hospital with BP issues over night. She was walking with spc outdoors, inside she holds to the walls.    Limitations Walking;Standing    How long can you stand comfortably? 15 minutes    How long can you walk comfortably? 5 mins    Patient Stated Goals to have better balance and walk better    Currently in Pain? No/denies    Pain Score 0-No pain          Ther-ex Hip abd/ext x10 each Standing marching x10 each  Neuromuscular Re-education  Ambulating outside with gait belt/contact guard down inclines/uneven surfaces approx 10 min intervals of walking cueing for pace and posture  Postural strengthening scap retractions x10 Seated rows green TB x10          PT Education - 05/24/20 1307         Education Details exercise technique, body mechanics     Person(s)                  PT Education - 05/24/20 1307         Education Details exercise technique, body mechanics     Person(s)                 PT  Education - 05/26/20 1342    Education Details walkng mechanics and speed    Person(s) Educated Patient    Methods Explanation    Comprehension Verbalized understanding            PT Short Term Goals - 05/19/20 1457      PT SHORT TERM GOAL #1   Title Patient (> 72 years old) will complete five times sit to stand test in < 15 seconds indicating an increased LE strength and improved balance.    Time 4    Period Weeks    Status Partially Met    Target Date 05/19/20             PT Long Term Goals - 05/26/20 1343      PT LONG TERM GOAL #1   Title Patient will increase BLE gross strength to 4+/5 as to improve functional strength for independent gait, increased standing tolerance and increased ADL ability.    Baseline 8    Time 8    Period Weeks    Status Partially Met      PT LONG TERM GOAL #2   Title Patient will increase Berg Balance score toi >45  to decrease falls risk.    Baseline $RemoveB'@eval'[id number]$  28; 10/7: 39., 04/21/20=32/56    Time 8     Period Weeks    Status Partially Met      PT LONG TERM GOAL #3   Title Patient will reduce timed up and go to <11 seconds to reduce fall risk and demonstrate improved transfer/gait ability.    Baseline At eval 27sec, 10/7: 27sec (some confusion about sitting at end) eschews SPC use, 04/21/20=27.28 sec, 05/19/20=16.13sec    Time 8    Period Weeks    Status Partially Met      PT LONG TERM GOAL #4   Title Patient will increase 10 meter walk test to >1.14m/s as to improve gait speed for better community ambulation and to reduce fall risk.    Baseline 48sec at eval; 10/7: 19sec self selected, 16sec fastest, 04/21/20/= .47 m/ sec, 05/19/20=.38m/sec    Time 8    Period Weeks    Status Partially Met                 Plan - 05/26/20 1343    Clinical Impression Statement Patient able to ambulate with standard cane and contact guard with gait belt at approx 10 min intervals prior to rest break.  The patient needed mild cueing for gait pattern and to slow speed down. The patient continues to benefit from additional skilled PT intervention to further improve balance and overall strength for improved confidence and ability to ambulate household and community distances    Personal Factors and Comorbidities Age;Comorbidity 1;Comorbidity 2    Comorbidities HTN, osteoporosis, visual impairment, back pain, incontenence, Cancer    Examination-Activity Limitations Locomotion Level;Stairs    Rehab Potential Good    PT Frequency 2x / week    PT Duration 8 weeks    PT Treatment/Interventions Neuromuscular re-education;Therapeutic exercise;Therapeutic activities;Balance training;Functional mobility training;Gait training           Patient will benefit from skilled therapeutic intervention in order to improve the following deficits and impairments:  Abnormal gait, Decreased activity tolerance, Decreased endurance, Decreased knowledge of use of DME, Impaired flexibility, Difficulty walking, Decreased  balance, Pain  Visit Diagnosis: Muscle weakness (generalized)  Difficulty in walking, not elsewhere classified     Problem List Patient Active Problem List  Diagnosis Date Noted  . Hematochezia 04/11/2020  . Acute colitis 04/11/2020  . GI bleeding 04/11/2020  . Iron deficiency anemia 03/08/2020  . Goals of care, counseling/discussion 03/08/2020  . 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 Sinus bradycardia 04/09/2019  . First degree AV block 04/09/2019  . Postural dizziness with presyncope 02/25/2019  . Osteoporosis 08/21/2018  . Osteoarthritis of left hip 05/21/2018  . Bilateral carotid artery stenosis 04/19/2018  . History of syncope 10/17/2017  . MCI (mild cognitive impairment) 08/12/2017  . Lung nodules 07/09/2017  . Paroxysmal atrial fibrillation ([place]) 04/18/2017  . Pleural effusion 10/20/2016  . Hyponatremia 10/20/2016  . Chronic diastolic congestive heart failure ([place]) 06/28/2016  . Primary open-angle glaucoma, bilateral, mild stage 05/08/2016  . Labile blood glucose 04/27/2016  . Bladder prolapse, female, acquired 02/25/2016  . 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  . B12 deficiency 01/11/2016  . Anemia 01/10/2016  . CLL (chronic lymphocytic leukemia) ([place]) 01/10/2016  . 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 12/21/2015  . Bradycardia 12/21/2015  . Lymphocytosis 06/25/2015  . Impaired fasting glucose 06/22/2015  . Carotid atherosclerosis 06/22/2015  . Essential hypertension 12/22/2014  . Hyperlipidemia 12/22/2014  . Hypothyroidism 12/22/2014   Hal Morales, DPT 05/26/2020, 5:25 PM  [place] MAIN [facility] SERVICES [address] [place], [place], [address] Phone: [phone]   Fax:  [phone]  Name: Sydney Mcdaniel MRN: [id number] Date of Birth: 04-09-1925

## 2020-05-27 NOTE — Progress Notes (Signed)
[facility]  [address], [address] [place], [place] [address] Phone: [phone]  Fax: [phone]   Clinic Day:  05/31/2020  Referring physician: Delsa Grana, PA-C  Chief Complaint: Sydney Mcdaniel is a 86 y.o. female with chronic lymphocytic leukemia, B12 deficiency, and a normocytic anemia who is seen for 3 month assessment.  HPI: The patient was last seen in the hematology clinic on 03/08/2020 for a telemedicine visit. At that time, she reported fatigue/low energy. She described balance problems.  Exam was stable.  She had not had a colonoscopy in the past 10 years. Hematocrit was 30.2, hemoglobin 10.3, MCV 92.9, platelets 191,000, WBC 8,700.  Sodium was 130. Creatinine was 0.85 (CrCl 59 ml/min). Ferritin was 78 with an iron saturation of 24% and a TIBC of 293.  Reticulocyte count was 1.8%.  Coombs was + (IgG). Haptoglobin was 111 (normal). LDH was 127 (normal). She received a vitamin B12 injection.  She received vitamin B12 injections monthly x 3 (03/08/2020 - 05/03/2020).  The patient was admitted to [facility] from 04/11/2020 and 04/13/2020. She presented with nausea, vomiting, diarrhea, and blood in the stool. Abdomen and pelvis CT with contrast showed inflammatory changes in the sigmoid and rectum consistent with focal colitis. No definitive areas of active hemorrhage were seen. Some prominent hemorrhoids were noted in the region of the rectum. There was cholelithiasis with a gallstone within the neck. No wall thickening or pericholecystic fluid was noted. It was felt that the patient likely had food poisoning due to ranch dressing. She also had leukocytosis on arrival (WBC 12,800).  She was treated with Zosyn x 2 days. She was discharged on 4 more days of Cipro.  Xarelto was held and she was to resume 2 days after discharge.  The patient saw Laurann Montana, NP on 05/19/2020. She is going to reduce Xarelto to 15 mg daily after she finishes her current  prescription.  During the interim, she has been "okay." She is going to PT twice per week and feels that it is helping. Her balance has improved. She paces herself at home but is able to do everything she wants to do. She denies fevers, sweats, shortness of breath, palpitations, lumps, bumps, bruising, and bleeding. He left hip pain has resolved.   The patient is considering seeing a specialist for her glaucoma. She recently had a mole removed on her neck.  She takes oral iron BID.   Past Medical History:  Diagnosis Date  . Allergy   . Anemia   . Cataract   . CLL (chronic lymphocytic leukemia) ([place])   . Glaucoma   . H/O: hysterectomy    Total  . Hyperlipidemia   . Hypertension   . Hypothyroidism   . Impaired fasting glucose   . Lichen sclerosus   . Osteoporosis    Hips  . PAF (paroxysmal atrial fibrillation) ([place])    a. 06/15/2016 Event monitor: 4% afib burden; b. CHA2DS2VASc - 4-->Xarelto.  . Syncope    a. 03/2016 Echo: EF55-60%, no rwma, mild AI/MR, nl PASP; b. 03/2016 48h Holter: no significant arrhythmias/pauses; c. 03/2016 MV: mild apical defect, likely breast attenuation, nl EF, low risk; d. 05/2016 Event monitor: No significant arrhythmia; e. 05/2016 Event monitor: PAF (4%).    Past Surgical History:  Procedure Laterality Date  . APPENDECTOMY    . EYE SURGERY     Glaucoma  . TOTAL ABDOMINAL HYSTERECTOMY      Family History  Problem Relation Age of Onset  .  Heart attack Mother   . Glaucoma Mother   . Heart attack Father   . Parkinson's disease Brother   . Heart attack Brother     Social History:  reports that she has never smoked. She has never used smokeless tobacco. She reports that she does not drink alcohol and does not use drugs.  Her husband died in December 12, 2014. She notes that she played basketball 75 years ago on a half court. She lives in [place].Her daughterisKaren. Her daughter can be reached at ([phone]). The patient is accompanied by her  dauhgter today.  Allergies:  Allergies  Allergen Reactions  . Other   . Alphagan [Brimonidine] Itching and Rash  . Pravachol [Pravastatin Sodium] Itching and Rash  . Pravastatin Itching, Other (See Comments) and Rash    Current Medications: Current Outpatient Medications  Medication Sig Dispense Refill  . [START ON 06/08/2020] alendronate (FOSAMAX) 70 MG tablet TAKE ONE TABLET BY MOUTH EVERY 7 DAYS. TAKE WITH A FULL GLASS OF WATER ON AN EMPTY STOMACH. (Patient taking differently: Take 70 mg by mouth every Friday. TAKE ONE TABLET BY MOUTH EVERY 7 DAYS. TAKE WITH A FULL GLASS OF WATER ON AN EMPTY STOMACH.) 12 tablet 3  . amLODipine (NORVASC) 5 MG tablet Take 1 tablet (5 mg total) by mouth daily. 90 tablet 1  . atorvastatin (LIPITOR) 20 MG tablet Take 1 tablet (20 mg total) by mouth at bedtime. 90 tablet 3  . Calcium Carb-Cholecalciferol (CALCIUM 600 + D PO) Take 1 tablet by mouth daily.     . cholecalciferol (VITAMIN D) 1000 units tablet Take 1,000 Units by mouth daily.    . Cyanocobalamin (B-12) 1000 MCG/ML KIT Inject 1,000 mcg as directed every 30 (thirty) days.    . famotidine (PEPCID) 20 MG tablet Take 20 mg by mouth daily.    . ferrous sulfate 325 (65 FE) MG tablet Take 325 mg by mouth 2 (two) times daily with a meal.    . latanoprost (XALATAN) 0.005 % ophthalmic solution Place 1 drop into the right eye at bedtime.     Marland Kitchen levothyroxine (EUTHYROX) 75 MCG tablet TAKE ONE TABLET BY MOUTH ON MONDAYS, WEDNESDAYS, FRIDAYS, AND SUNDAYS (Patient taking differently: Take 75 mcg by mouth as directed. TAKE ONE TABLET BY MOUTH ON MONDAYS, WEDNESDAYS, FRIDAYS, AND SUNDAYS) 52 tablet 2  . levothyroxine (EUTHYROX) 88 MCG tablet TAKE 1 TABLET BY MOUTH ON TUESDAYS, THURSDAYS, AND SATURDAYS (Patient taking differently: Take 88 mcg by mouth as directed. TAKE 1 TABLET BY MOUTH ON TUESDAYS, THURSDAYS, AND SATURDAYS) 39 tablet 2  . lisinopril (ZESTRIL) 20 MG tablet Take 1 tablet (20 mg total) by mouth daily. 90  tablet 1  . Multiple Vitamins-Minerals (MULTIVITAMIN ADULT PO) Take by mouth.    . Omega-3 Fatty Acids (FISH OIL) 1200 MG CAPS Take 1,200 mg by mouth daily.     . Rivaroxaban (XARELTO) 15 MG TABS tablet Take 1 tablet (15 mg total) by mouth daily with supper. 90 tablet 3  . timolol (TIMOPTIC) 0.5 % ophthalmic solution Place 1 drop into the right eye 2 (two) times daily.      No current facility-administered medications for this visit.    Review of Systems  Constitutional: Negative for chills, diaphoresis, fever and malaise/fatigue.       Feels "okay."  HENT: Negative for congestion, ear discharge, ear pain, hearing loss, nosebleeds, sinus pain, sore throat and tinnitus.   Eyes: Negative for blurred vision.       Glaucoma.  Respiratory: Negative for cough, hemoptysis, sputum production and shortness of breath.   Cardiovascular: Negative for chest pain, palpitations and leg swelling.       Atrial fibrillation.   Gastrointestinal: Negative for abdominal pain, blood in stool, constipation, diarrhea, heartburn, melena, nausea and vomiting.  Genitourinary: Negative for dysuria, frequency, hematuria and urgency.  Musculoskeletal: Negative for back pain, joint pain, myalgias and neck pain.  Skin: Negative for itching and rash.  Neurological: Negative for dizziness, tingling, sensory change, weakness and headaches.       Balance has improved with PT  Endo/Heme/Allergies: Does not bruise/bleed easily.  Psychiatric/Behavioral: Negative for depression and memory loss. The patient is not nervous/anxious and does not have insomnia.   All other systems reviewed and are negative.  Performance status (ECOG): 1  Vitals Blood pressure (!) 179/45, pulse 67, temperature 97.8 F (36.6 C), resp. rate 20, weight 141 lb 5 oz (64.1 kg), SpO2 100 %.   Physical Exam Vitals and nursing note reviewed.  Constitutional:      General: She is not in acute distress.    Appearance: She is well-developed. She is  not diaphoretic.     Comments: Cane by her side. Required assistance onto exam table.  HENT:     Head: Normocephalic and atraumatic.     Comments: Curly gray hair.    Mouth/Throat:     Pharynx: No oropharyngeal exudate.  Eyes:     General: No scleral icterus.    Conjunctiva/sclera: Conjunctivae normal.     Pupils: Pupils are equal, round, and reactive to light.     Comments: Glasses.  Blue eyes.  Cardiovascular:     Rate and Rhythm: Normal rate and regular rhythm.     Heart sounds: Normal heart sounds. No murmur heard.   Pulmonary:     Effort: Pulmonary effort is normal. No respiratory distress.     Breath sounds: Normal breath sounds. No wheezing or rales.  Chest:     Chest wall: No tenderness.  Breasts:     Right: No axillary adenopathy or supraclavicular adenopathy.     Left: No axillary adenopathy or supraclavicular adenopathy.    Abdominal:     General: Bowel sounds are normal. There is no distension.     Palpations: Abdomen is soft. There is no mass.     Tenderness: There is no abdominal tenderness. There is no guarding or rebound.  Musculoskeletal:        General: No tenderness. Normal range of motion.     Cervical back: Normal range of motion and neck supple.     Right lower leg: Edema (chronic, 1+) present.     Left lower leg: Edema (chronic, 3+) present.  Lymphadenopathy:     Head:     Right side of head: No preauricular, posterior auricular or occipital adenopathy.     Left side of head: No preauricular, posterior auricular or occipital adenopathy.     Cervical: No cervical adenopathy.     Upper Body:     Right upper body: No supraclavicular or axillary adenopathy.     Left upper body: No supraclavicular or axillary adenopathy.     Lower Body: No right inguinal adenopathy. No left inguinal adenopathy.  Skin:    General: Skin is warm and dry.  Neurological:     Mental Status: She is alert and oriented to person, place, and time.  Psychiatric:         Behavior: Behavior normal.  Thought Content: Thought content normal.        Judgment: Judgment normal.    Appointment on 05/31/2020  Component Date Value Ref Range Status  . Sodium 05/31/2020 134* 135 - 145 mmol/L Final  . Potassium 05/31/2020 4.4  3.5 - 5.1 mmol/L Final  . Chloride 05/31/2020 100  98 - 111 mmol/L Final  . CO2 05/31/2020 24  22 - 32 mmol/L Final  . Glucose, Bld 05/31/2020 102* 70 - 99 mg/dL Final   Glucose reference range applies only to samples taken after fasting for at least 8 hours.  . BUN 05/31/2020 19  8 - 23 mg/dL Final  . Creatinine, Ser 05/31/2020 0.85  0.44 - 1.00 mg/dL Final  . Calcium 05/31/2020 8.8* 8.9 - 10.3 mg/dL Final  . Total Protein 05/31/2020 7.5  6.5 - 8.1 g/dL Final  . Albumin 05/31/2020 3.8  3.5 - 5.0 g/dL Final  . AST 05/31/2020 20  15 - 41 U/L Final  . ALT 05/31/2020 17  0 - 44 U/L Final  . Alkaline Phosphatase 05/31/2020 25* 38 - 126 U/L Final  . Total Bilirubin 05/31/2020 0.5  0.3 - 1.2 mg/dL Final  . GFR, Estimated 05/31/2020 >60  >60 mL/min Final   Comment: (NOTE) Calculated using the CKD-EPI Creatinine Equation (2021)   . Anion gap 05/31/2020 10  5 - 15 Final   Performed at [facility], [address] [place], [place] [address]  . WBC 05/31/2020 9.2  4.0 - 10.5 K/uL Final  . RBC 05/31/2020 3.37* 3.87 - 5.11 MIL/uL Final  . Hemoglobin 05/31/2020 10.4* 12.0 - 15.0 g/dL Final  . HCT 05/31/2020 31.4* 36 - 46 % Final  . MCV 05/31/2020 93.2  80.0 - 100.0 fL Final  . MCH 05/31/2020 30.9  26.0 - 34.0 pg Final  . MCHC 05/31/2020 33.1  30.0 - 36.0 g/dL Final  . RDW 05/31/2020 14.4  11.5 - 15.5 % Final  . Platelets 05/31/2020 205  150 - 400 K/uL Final  . nRBC 05/31/2020 0.0  0.0 - 0.2 % Final  . Neutrophils Relative % 05/31/2020 41  % Final  . Neutro Abs 05/31/2020 3.8  1.7 - 7.7 K/uL Final  . Lymphocytes Relative 05/31/2020 49  % Final  . Lymphs Abs 05/31/2020 4.5* 0.7 - 4.0 K/uL Final  . Monocytes Relative  05/31/2020 8  % Final  . Monocytes Absolute 05/31/2020 0.8  0.1 - 1.0 K/uL Final  . Eosinophils Relative 05/31/2020 2  % Final  . Eosinophils Absolute 05/31/2020 0.2  0.0 - 0.5 K/uL Final  . Basophils Relative 05/31/2020 0  % Final  . Basophils Absolute 05/31/2020 0.0  0.0 - 0.1 K/uL Final  . Immature Granulocytes 05/31/2020 0  % Final  . Abs Immature Granulocytes 05/31/2020 0.02  0.00 - 0.07 K/uL Final   Performed at [facility], [address] [place], [place] [address]    Assessment:  Sydney Mcdaniel is a 86 y.o. female with stage 0 CLL. She presented with a distant history of anemia and recent lymphocytosis. She was noted to have mild lymphocystosis in 06/22/2015. Absolute lymphocyte count (ALC) has ranged between 4700 - 5500.  Work-up on 07/17/2017revealed a hematocrit of 31.6, hemoglobin 11.4, MCV 89, platelets 203,000, WBC 11,00 with an ANC of 3700. Absolute lymphocyte count was 6400. Ferritin was 116. B12was 119 (low). Folate was 44.0. Retic was 0.9% (low). Uric acid was 4.5. Guaiac cards were negative x 3 in 12/2015.  She was diagnosed  with B12 deficiency. She receives B12 monthly (last 05/03/2020). Folatewas 28.2 on 07/18/2019.  Peripheral blood flow cytometryrevealed involvement by a CD5+, CD23+, CD38- monoclonal B cell population with lambda light chain restriction consistent with CLL/SLL. There was a small population (8% of the T cells) of double positive (CD4+/CD8+) T cells (described in association with chronic viral infections, autoimmune disorders, chronic inflammatory disorders, and immunodeficiency states).   She has a progressive normocytic anemia. Work-up on 02/12/2018revealed the following normal labs: creatinine, LDH, uric acid, ferritin (105), iron studies (12% sat; TIBC 295), and folate. Retic was 1.1% (low for level of anemia). Coombs was negative on 09/04/2016. Dietis modest. She denies any melena or hematochezia. Guaiac  cardswere negative x 3 in 07/2016. Last colonoscopywas >10 years ago.  Work-up on 12/04/2019 revealed a hematocrit of 30.2, hemoglobin 10.0, MCV 92.9, platelets 179,000, WBC 9,000. Creatinine was 0.87 (CrCl 58 ml/min). Normal studies included: folate (31.0), haptoglobin, LDH, hepatitis B surface antigen, hepatitis B core antibody, and hepatitis C antibody.  Retic was 1.4% (low).  Peripheral smear on 12/04/2019 revealed relative lymphocytosis, comprised of small lymphocytes. There were no increases in larger prolymphocytes. There was normocytic anemia. There was normal platelet count and morphology.    Ferritinhas been followed: 116 on 01/10/2016, 105 on 08/07/2016, 29 on 11/27/2016, 31 on 03/06/2017, 59 on 07/09/2017, 64 on 11/08/2017, 98 on 05/13/2018, 74 on 07/18/2019, 73 on 11/26/2019, and 78 on 03/08/2020.  She was diagnosed with atrial fibrillationafter presenting withsyncopal episodes. She is on Xarelto 15 mg a day.  Chest CTon 11/07/2016 revealed two 2 mm right lung nodules (right major fissure and lateral aspect RUL). There was a 5 mm right lower lobe ground-glass nodule. Chest CTon 11/05/2017 revealed 6 mm ground-glass right lower lobe nodule, stable since 11/07/2016.  There were millimetric pulmonary nodules unchanged and considered benign. Chest CT on 11/07/2019 revealed small scattered lung nodules (up to 5 mm)- stable.  Etiology was likely benign and no further follow-up was recommended.  She was admitted to [facility] from 04/11/2020 and 04/13/2020 with nausea, vomiting, diarrhea, and blood in the stool. Abdomen and pelvis CT with contrast showed inflammatory changes in the sigmoid and rectum consistent with focal colitis.  No definitive areas of active hemorrhage were seen. Some prominent hemorrhoids were noted in the region of the rectum. She was felt to have had food poisoning.  She was treated with Zosyn then discharged on Cipro.   Symptomatically,  she feels "ok." She denies  fevers, sweats,adenopathy, bruising, and bleeding. Exam reveals no adenopathy or hepatosplenomegaly.  Plan: 1.   Labs today:  CBC with diff, CMP, ferritin, iron studies, retic. 2. CLL Hematocrit 31.4.  Hemoglobin 10.4.  MCV 93.2.  Platelets 205,000.  WBC 9200 (ANC 3800; ALC 4500)   Clinically, she continues to do well.  She denies any B symptoms. Exam reveals no adenopathy or hepatosplenomegaly. Continue surveillance every 6 months. 3.   Mild normocytic anemia  Hematocrit 31.4.  Hemoglobin 10.4.  MCV 93.2 on 05/31/2020.     Ferritin 74 with an iron saturation of 24% and a TIBC of 293   Patient is on monthly B12.  She is on oral iron twice daily.  DAT+ for IgG c/w a warm autoimmune antibody.   Retic count and LDH normal not consistent with hemolysis on 03/08/2020.     Haptoglobin and bilirubin were normal.  Anemia in CLL can be the result of marrow infiltration, splenomegaly, and hemolytic anemia.  Continue to monitor for any active hemolysis by  periodic check (bilirubin, LDH, haptoglobin, peripheral smear-spherocytes).   If clear documentation of hemolysis found, plan to initiate steroids and/or Rituxan.  No evidence of active hemolysis today. 4. B12 deficiency  She last received B12 on 05/03/2020.    B12 today and monthly x6. Folate 31.0 on 12/04/2019             Check folate annually. 5.Iron deficiency Hemoglobin 10.4. MCV 93.2. Ferritin 74 with an iron saturation of 24 % and a TIBC of 293.               Continue oral iron twice daily.  Consider Venofer if needed.  She has not had a colonoscopy in the past 10 years. 6.   Tiny pulmonary nodules Chest CT on 11/05/2017 revealed a 6 mm RLL nodule.  Chest CT on 11/07/2019 revealed small scattered lung nodules (up to 5 mm)- stable.             No plans for follow-up chest CT. 7. Hyponatremia, improved Sodium  134. Continue to monitor.  8.   RTC in 4 months for MD assessment, labs (CBC with diff, CMP, ferritin, iron studies, retic).  I discussed the assessment and treatment plan with the patient.  The patient was provided an opportunity to ask questions and all were answered.  The patient agreed with the plan and demonstrated an understanding of the instructions.  The patient was advised to call back if the symptoms worsen or if the condition fails to improve as anticipated.   Lequita Asal, MD, PhD    05/31/2020, 10:53 AM  I, Mirian Mo Tufford, am acting as Education administrator for [employer]. Mike Gip, MD, PhD.  I, Anivea Velasques C. Mike Gip, MD, have reviewed the above documentation for accuracy and completeness, and I agree with the above.

## 2020-05-31 ENCOUNTER — Inpatient Hospital Stay: Payer: Medicare Other | Attending: Hematology and Oncology

## 2020-05-31 ENCOUNTER — Encounter: Payer: Self-pay | Admitting: Hematology and Oncology

## 2020-05-31 ENCOUNTER — Other Ambulatory Visit: Payer: Self-pay

## 2020-05-31 ENCOUNTER — Inpatient Hospital Stay (HOSPITAL_BASED_OUTPATIENT_CLINIC_OR_DEPARTMENT_OTHER): Payer: Medicare Other | Admitting: Hematology and Oncology

## 2020-05-31 ENCOUNTER — Inpatient Hospital Stay: Payer: Medicare Other

## 2020-05-31 VITALS — BP 179/45 | HR 67 | Temp 97.8°F | Resp 20 | Wt 141.3 lb

## 2020-05-31 DIAGNOSIS — D509 Iron deficiency anemia, unspecified: Secondary | ICD-10-CM

## 2020-05-31 DIAGNOSIS — E871 Hypo-osmolality and hyponatremia: Secondary | ICD-10-CM | POA: Diagnosis not present

## 2020-05-31 DIAGNOSIS — E039 Hypothyroidism, unspecified: Secondary | ICD-10-CM | POA: Insufficient documentation

## 2020-05-31 DIAGNOSIS — D649 Anemia, unspecified: Secondary | ICD-10-CM | POA: Insufficient documentation

## 2020-05-31 DIAGNOSIS — R918 Other nonspecific abnormal finding of lung field: Secondary | ICD-10-CM | POA: Diagnosis not present

## 2020-05-31 DIAGNOSIS — I480 Paroxysmal atrial fibrillation: Secondary | ICD-10-CM | POA: Insufficient documentation

## 2020-05-31 DIAGNOSIS — Z7901 Long term (current) use of anticoagulants: Secondary | ICD-10-CM | POA: Diagnosis not present

## 2020-05-31 DIAGNOSIS — E538 Deficiency of other specified B group vitamins: Secondary | ICD-10-CM | POA: Insufficient documentation

## 2020-05-31 DIAGNOSIS — C9110 Chronic lymphocytic leukemia of B-cell type not having achieved remission: Secondary | ICD-10-CM | POA: Insufficient documentation

## 2020-05-31 DIAGNOSIS — I6523 Occlusion and stenosis of bilateral carotid arteries: Secondary | ICD-10-CM | POA: Diagnosis not present

## 2020-05-31 LAB — COMPREHENSIVE METABOLIC PANEL
ALT: 17 U/L (ref 0–44)
AST: 20 U/L (ref 15–41)
Albumin: 3.8 g/dL (ref 3.5–5.0)
Alkaline Phosphatase: 25 U/L — ABNORMAL LOW (ref 38–126)
Anion gap: 10 (ref 5–15)
BUN: 19 mg/dL (ref 8–23)
CO2: 24 mmol/L (ref 22–32)
Calcium: 8.8 mg/dL — ABNORMAL LOW (ref 8.9–10.3)
Chloride: 100 mmol/L (ref 98–111)
Creatinine, Ser: 0.85 mg/dL (ref 0.44–1.00)
GFR, Estimated: >60 mL/min (ref >60)
Glucose, Bld: 102 mg/dL — ABNORMAL HIGH (ref 70–99)
Potassium: 4.4 mmol/L (ref 3.5–5.1)
Sodium: 134 mmol/L — ABNORMAL LOW (ref 135–145)
Total Bilirubin: 0.5 mg/dL (ref 0.3–1.2)
Total Protein: 7.5 g/dL (ref 6.5–8.1)

## 2020-05-31 LAB — CBC WITH DIFFERENTIAL/PLATELET
Abs Immature Granulocytes: 0.02 10*3/uL (ref 0.00–0.07)
Basophils Absolute: 0 10*3/uL (ref 0.0–0.1)
Basophils Relative: 0 %
Eosinophils Absolute: 0.2 10*3/uL (ref 0.0–0.5)
Eosinophils Relative: 2 %
HCT: 31.4 % — ABNORMAL LOW (ref 36.0–46.0)
Hemoglobin: 10.4 g/dL — ABNORMAL LOW (ref 12.0–15.0)
Immature Granulocytes: 0 %
Lymphocytes Relative: 49 %
Lymphs Abs: 4.5 10*3/uL — ABNORMAL HIGH (ref 0.7–4.0)
MCH: 30.9 pg (ref 26.0–34.0)
MCHC: 33.1 g/dL (ref 30.0–36.0)
MCV: 93.2 fL (ref 80.0–100.0)
Monocytes Absolute: 0.8 10*3/uL (ref 0.1–1.0)
Monocytes Relative: 8 %
Neutro Abs: 3.8 10*3/uL (ref 1.7–7.7)
Neutrophils Relative %: 41 %
Platelets: 205 10*3/uL (ref 150–400)
RBC: 3.37 MIL/uL — ABNORMAL LOW (ref 3.87–5.11)
RDW: 14.4 % (ref 11.5–15.5)
WBC: 9.2 10*3/uL (ref 4.0–10.5)
nRBC: 0 % (ref 0.0–0.2)

## 2020-05-31 LAB — RETICULOCYTES
Immature Retic Fract: 9.8 % (ref 2.3–15.9)
RBC.: 3.29 MIL/uL — ABNORMAL LOW (ref 3.87–5.11)
Retic Count, Absolute: 54.6 10*3/uL (ref 19.0–186.0)
Retic Ct Pct: 1.7 % (ref 0.4–3.1)

## 2020-05-31 LAB — IRON AND TIBC
Iron: 71 ug/dL (ref 28–170)
Saturation Ratios: 24 % (ref 10.4–31.8)
TIBC: 293 ug/dL (ref 250–450)
UIBC: 222 ug/dL

## 2020-05-31 LAB — FERRITIN: Ferritin: 74 ng/mL (ref 11–307)

## 2020-05-31 MED ORDER — CYANOCOBALAMIN 1000 MCG/ML IJ SOLN
INTRAMUSCULAR | Status: AC
  Filled 2020-05-31: qty 1

## 2020-05-31 MED ORDER — CYANOCOBALAMIN 1000 MCG/ML IJ SOLN
1000.0000 ug | Freq: Once | INTRAMUSCULAR | Status: AC
  Administered 2020-05-31: 1000 ug via INTRAMUSCULAR

## 2020-06-01 ENCOUNTER — Ambulatory Visit: Payer: Medicare Other

## 2020-06-01 DIAGNOSIS — M6281 Muscle weakness (generalized): Secondary | ICD-10-CM | POA: Diagnosis not present

## 2020-06-01 DIAGNOSIS — R262 Difficulty in walking, not elsewhere classified: Secondary | ICD-10-CM

## 2020-06-01 NOTE — Therapy (Signed)
[place] MAIN [facility] SERVICES [address] [place], [place], [address] Phone: [phone]   Fax:  [phone]  Physical Therapy Treatment  Patient Details  Name: Sydney Mcdaniel MRN: [id number] Date of Birth: [birth year] Referring Provider (PT): Delsa Grana   Encounter Date: 06/01/2020   PT End of Session - 06/01/20 1105    Visit Number 23    Number of Visits 34    Date for PT Re-Evaluation 06/16/20    PT Start Time 1100    PT Stop Time 1144    PT Time Calculation (min) 44 min    Equipment Utilized During Treatment Gait belt    Activity Tolerance Patient tolerated treatment well    Behavior During Therapy [facility] for tasks assessed/performed           Past Medical History:  Diagnosis Date  . Allergy   . Anemia   . Cataract   . CLL (chronic lymphocytic leukemia) ([place])   . Glaucoma   . H/O: hysterectomy    Total  . Hyperlipidemia   . Hypertension   . Hypothyroidism   . Impaired fasting glucose   . Lichen sclerosus   . Osteoporosis    Hips  . PAF (paroxysmal atrial fibrillation) ([place])    a. 06/15/2016 Event monitor: 4% afib burden; b. CHA2DS2VASc - 4-->Xarelto.  . Syncope    a. 03/2016 Echo: EF55-60%, no rwma, mild AI/MR, nl PASP; b. 03/2016 48h Holter: no significant arrhythmias/pauses; c. 03/2016 MV: mild apical defect, likely breast attenuation, nl EF, low risk; d. 05/2016 Event monitor: No significant arrhythmia; e. 05/2016 Event monitor: PAF (4%).    Past Surgical History:  Procedure Laterality Date  . APPENDECTOMY    . EYE SURGERY     Glaucoma  . TOTAL ABDOMINAL HYSTERECTOMY      There were no vitals filed for this visit.   Subjective Assessment - 06/01/20 1104    Subjective Patient and patient's daughter reports she had a hard time getting up/down off examination table at doctor the other day.  Reports no falls or LOB since last session.    Patient is accompained by: Family member    Pertinent History Patient had  HHPT 10 months ago. Before that she was in the hospital with BP issues over night. She was walking with spc outdoors, inside she holds to the walls.    Limitations Walking;Standing    How long can you stand comfortably? 15 minutes    How long can you walk comfortably? 5 mins    Patient Stated Goals to have better balance and walk better    Currently in Pain? No/denies                  Ther-ex  Nu-step L3 x 4 minutes for cardiovascular challenge Sit to stand with no UE support from soft surface  10x Seated:  #3 ankle weights:  -seated LAQ kicking soccer ball for coordination, strengthening of LE's, and timing of muscle contraction x 2 minutes -LAQ with soccer ball adduction between feet 10x -ER/IR with blue band on floor; 15x with cueing for sequencing  Hospital examination room negotiation simulation with use of raised plinth table and 6' step, cues for hand placement, sequencing of movement of hands and feet for optimal stabilization to ascend/descend onto/off of table. x2 trials with second trial patient demonstrating increased ind and safety.    Neuromuscular Re-education by support bar:    airex pad: sit to stand with stabilization/CGA x  10 from low mat surface.  Ambulate with horizontal and vertical head turns to scan room, squat and pick up or stand on tip toes and reach for cones in varying placements throughout gym for carryover to environments like the grocery store. First trial with SPC, second trial without SPC.  Seated: 6" step alternating LE placement taps for sequencing, coordination, muscle recruitment patterning 30 seconds x 2 trials.  Balloon taps seated reaching inside/outside BOS for coordination, sequencing, ability to maintain COM with pertubations x 3 minutes Balloon taps with all body parts except hands to include LAQ, marching, forward trunk lean, etc for coordination, sequencing, and stabilization against pertubations x 2 minutes    Patient performed with  instruction, verbal cues, tactile cues of therapist: goal: increase tissue extensibility, promote proper posture, improve mobility   vitals monitored throughout session.                           PT Education - 06/01/20 1105    Education Details exercise technique, body mechanics    Person(s) Educated Patient    Methods Explanation;Demonstration;Tactile cues;Verbal cues    Comprehension Verbalized understanding;Returned demonstration;Verbal cues required;Tactile cues required            PT Short Term Goals - 05/19/20 1457      PT SHORT TERM GOAL #1   Title Patient (> 27 years old) will complete five times sit to stand test in < 15 seconds indicating an increased LE strength and improved balance.    Time 4    Period Weeks    Status Partially Met    Target Date 05/19/20             PT Long Term Goals - 05/26/20 1343      PT LONG TERM GOAL #1   Title Patient will increase BLE gross strength to 4+/5 as to improve functional strength for independent gait, increased standing tolerance and increased ADL ability.    Baseline 8    Time 8    Period Weeks    Status Partially Met      PT LONG TERM GOAL #2   Title Patient will increase Berg Balance score toi >45  to decrease falls risk.    Baseline _0  28; 10/7: 39., 04/21/20=32/56    Time 8    Period Weeks    Status Partially Met      PT LONG TERM GOAL #3   Title Patient will reduce timed up and go to <11 seconds to reduce fall risk and demonstrate improved transfer/gait ability.    Baseline At eval 27sec, 10/7: 27sec (some confusion about sitting at end) eschews SPC use, 04/21/20=27.28 sec, 05/19/20=16.13sec    Time 8    Period Weeks    Status Partially Met      PT LONG TERM GOAL #4   Title Patient will increase 10 meter walk test to >1.57ms as to improve gait speed for better community ambulation and to reduce fall risk.    Baseline 48sec at eval; 10/7: 19sec self selected, 16sec fastest, 04/21/20/=  .47 m/ sec, 05/19/20=.[id number]    Time 8    Period Weeks    Status Partially Met                 Plan - 06/01/20 1210    Clinical Impression Statement Patient is progressing with functional strengthening and mobility tasks. Introduction to dual task mobility initially resulted in regression of shuffle stepping however  with cueing and repetition patient is able to return to upright positioning with improved foot clearance.  Examination table carryover intervention tolerated well with patient demonstrating understanding and increased safety. The patient continues to benefit from additional skilled PT intervention to further improve balance and overall strength for improved confidence and ability to ambulate household and community distances    Personal Factors and Comorbidities Age;Comorbidity 1;Comorbidity 2    Comorbidities HTN, osteoporosis, visual impairment, back pain, incontenence, Cancer    Examination-Activity Limitations Locomotion Level;Stairs    Rehab Potential Good    PT Frequency 2x / week    PT Duration 8 weeks    PT Treatment/Interventions Neuromuscular re-education;Therapeutic exercise;Therapeutic activities;Balance training;Functional mobility training;Gait training           Patient will benefit from skilled therapeutic intervention in order to improve the following deficits and impairments:  Abnormal gait, Decreased activity tolerance, Decreased endurance, Decreased knowledge of use of DME, Impaired flexibility, Difficulty walking, Decreased balance, Pain  Visit Diagnosis: Muscle weakness (generalized)  Difficulty in walking, not elsewhere classified     Problem List Patient Active Problem List   Diagnosis Date Noted  . Normocytic anemia 05/31/2020  . Hematochezia 04/11/2020  . Acute colitis 04/11/2020  . GI bleeding 04/11/2020  . Iron deficiency anemia 03/08/2020  . Goals of care, counseling/discussion 03/08/2020  . Positive direct antiglobulin test  (DAT) 12/04/2019  . Sinus bradycardia 04/09/2019  . First degree AV block 04/09/2019  . Postural dizziness with presyncope 02/25/2019  . Osteoporosis 08/21/2018  . Osteoarthritis of left hip 05/21/2018  . Bilateral carotid artery stenosis 04/19/2018  . History of syncope 10/17/2017  . MCI (mild cognitive impairment) 08/12/2017  . Lung nodules 07/09/2017  . Paroxysmal atrial fibrillation ([place]) 04/18/2017  . Pleural effusion 10/20/2016  . Hyponatremia 10/20/2016  . Chronic diastolic congestive heart failure ([place]) 06/28/2016  . Primary open-angle glaucoma, bilateral, mild stage 05/08/2016  . Labile blood glucose 04/27/2016  . Bladder prolapse, female, acquired 02/25/2016  . 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  . B12 deficiency 01/11/2016  . Anemia 01/10/2016  . CLL (chronic lymphocytic leukemia) ([place]) 01/10/2016  . 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 12/21/2015  . Bradycardia 12/21/2015  . Lymphocytosis 06/25/2015  . Impaired fasting glucose 06/22/2015  . Carotid atherosclerosis 06/22/2015  . Essential hypertension 12/22/2014  . Hyperlipidemia 12/22/2014  . Hypothyroidism 12/22/2014   Janna Arch, PT, DPT   06/01/2020, 12:11 PM  [place] MAIN [facility] SERVICES [address] [place], [place], [address] Phone: [phone]   Fax:  [phone]  Name: Sydney Mcdaniel MRN: [id number] Date of Birth: 1925/06/13

## 2020-06-03 ENCOUNTER — Other Ambulatory Visit: Payer: Self-pay

## 2020-06-03 ENCOUNTER — Ambulatory Visit: Payer: Medicare Other

## 2020-06-03 DIAGNOSIS — R262 Difficulty in walking, not elsewhere classified: Secondary | ICD-10-CM

## 2020-06-03 DIAGNOSIS — M6281 Muscle weakness (generalized): Secondary | ICD-10-CM | POA: Diagnosis not present

## 2020-06-03 NOTE — Therapy (Signed)
[place] MAIN [facility] SERVICES [address] [place], [place], [address] Phone: [phone]   Fax:  [phone]  Physical Therapy Treatment  Patient Details  Name: Sydney Mcdaniel MRN: [id number] Date of Birth: [birth year] Referring Provider (PT): Delsa Grana   Encounter Date: 06/03/2020   PT End of Session - 06/03/20 1105    Visit Number 24    Number of Visits 34    Date for PT Re-Evaluation 06/16/20    PT Start Time 1100    PT Stop Time 1144    PT Time Calculation (min) 44 min    Equipment Utilized During Treatment Gait belt    Activity Tolerance Patient tolerated treatment well    Behavior During Therapy [facility] for tasks assessed/performed           Past Medical History:  Diagnosis Date  . Allergy   . Anemia   . Cataract   . CLL (chronic lymphocytic leukemia) ([place])   . Glaucoma   . H/O: hysterectomy    Total  . Hyperlipidemia   . Hypertension   . Hypothyroidism   . Impaired fasting glucose   . Lichen sclerosus   . Osteoporosis    Hips  . PAF (paroxysmal atrial fibrillation) ([place])    a. 06/15/2016 Event monitor: 4% afib burden; b. CHA2DS2VASc - 4-->Xarelto.  . Syncope    a. 03/2016 Echo: EF55-60%, no rwma, mild AI/MR, nl PASP; b. 03/2016 48h Holter: no significant arrhythmias/pauses; c. 03/2016 MV: mild apical defect, likely breast attenuation, nl EF, low risk; d. 05/2016 Event monitor: No significant arrhythmia; e. 05/2016 Event monitor: PAF (4%).    Past Surgical History:  Procedure Laterality Date  . APPENDECTOMY    . EYE SURGERY     Glaucoma  . TOTAL ABDOMINAL HYSTERECTOMY      There were no vitals filed for this visit.   Subjective Assessment - 06/03/20 1104    Subjective Patient reports no falls or LOB since lsat session. Reports its been cold so hasn't been able to go outside.    Patient is accompained by: Family member    Pertinent History Patient had HHPT 10 months ago. Before that she was in the hospital  with BP issues over night. She was walking with spc outdoors, inside she holds to the walls.    Limitations Walking;Standing    How long can you stand comfortably? 15 minutes    How long can you walk comfortably? 5 mins    Patient Stated Goals to have better balance and walk better    Currently in Pain? No/denies               Ther-ex  Nu-step L3 x 4 minutes for cardiovascular challenge Sumo squat with UE support on bar in front, cues for depth of squat 10x Green step lateral step up/down 10x each direction finger tip support.  Sit to stand, upon standing toss basketball up in air, catch then sit back down 10x   LAQ squeezing basketball between ankles 10x   Neuromuscular Re-education by support bar:  airex pad: green step lateral toe touch finger tip support 10x each LE airex pad: green step forward toe touch finger tip support 10x each LE airex pad: horizontal head turns 10x airex pad: vertical head turns 10x, very challenging for patient Green step: forward step up, then step down, step backwards back up, then down 6x each LE, SUE support;  Ambulate with gentle toss with basketball 40 ft x 2  trials, improved with repetition.   Patient performed with instruction, verbal cues, tactile cues of therapist: goal: increase tissue extensibility, promote proper posture, improve mobility   vitals monitored throughout session.                         PT Education - 06/03/20 1105    Education Details exercise technique, body mechanics    Person(s) Educated Patient    Methods Demonstration;Tactile cues;Explanation;Verbal cues    Comprehension Verbalized understanding;Returned demonstration;Verbal cues required;Tactile cues required            PT Short Term Goals - 05/19/20 1457      PT SHORT TERM GOAL #1   Title Patient (> 7 years old) will complete five times sit to stand test in < 15 seconds indicating an increased LE strength and improved balance.    Time  4    Period Weeks    Status Partially Met    Target Date 05/19/20             PT Long Term Goals - 05/26/20 1343      PT LONG TERM GOAL #1   Title Patient will increase BLE gross strength to 4+/5 as to improve functional strength for independent gait, increased standing tolerance and increased ADL ability.    Baseline 8    Time 8    Period Weeks    Status Partially Met      PT LONG TERM GOAL #2   Title Patient will increase Berg Balance score toi >45  to decrease falls risk.    Baseline _0  28; 10/7: 39., 04/21/20=32/56    Time 8    Period Weeks    Status Partially Met      PT LONG TERM GOAL #3   Title Patient will reduce timed up and go to <11 seconds to reduce fall risk and demonstrate improved transfer/gait ability.    Baseline At eval 27sec, 10/7: 27sec (some confusion about sitting at end) eschews SPC use, 04/21/20=27.28 sec, 05/19/20=16.13sec    Time 8    Period Weeks    Status Partially Met      PT LONG TERM GOAL #4   Title Patient will increase 10 meter walk test to >1.64ms as to improve gait speed for better community ambulation and to reduce fall risk.    Baseline 48sec at eval; 10/7: 19sec self selected, 16sec fastest, 04/21/20/= .47 m/ sec, 05/19/20=.720mec    Time 8    Period Weeks    Status Partially Met                 Plan - 06/03/20 1206    Clinical Impression Statement Patient continues to progress with functional mobility with today's session focusing on step negotiation in all directions as well as dynamic pertubations. Patient is progressing with backwards clearance however still needs SUE support at this time due to fear of LOB. Ambulation with pertubation of ball was tolerated well with patient improving with gait mechanics with repetition as she became more comfortable. The patient continues to benefit from additional skilled PT intervention to further improve balance and overall strength for improved confidence and ability to ambulate  household and community distances    Personal Factors and Comorbidities Age;Comorbidity 1;Comorbidity 2    Comorbidities HTN, osteoporosis, visual impairment, back pain, incontenence, Cancer    Examination-Activity Limitations Locomotion Level;Stairs    Rehab Potential Good    PT Frequency 2x / week  PT Duration 8 weeks    PT Treatment/Interventions Neuromuscular re-education;Therapeutic exercise;Therapeutic activities;Balance training;Functional mobility training;Gait training           Patient will benefit from skilled therapeutic intervention in order to improve the following deficits and impairments:  Abnormal gait,Decreased activity tolerance,Decreased endurance,Decreased knowledge of use of DME,Impaired flexibility,Difficulty walking,Decreased balance,Pain  Visit Diagnosis: Muscle weakness (generalized)  Difficulty in walking, not elsewhere classified     Problem List Patient Active Problem List   Diagnosis Date Noted  . Normocytic anemia 05/31/2020  . Hematochezia 04/11/2020  . Acute colitis 04/11/2020  . GI bleeding 04/11/2020  . Iron deficiency anemia 03/08/2020  . Goals of care, counseling/discussion 03/08/2020  . 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 Sinus bradycardia 04/09/2019  . First degree AV block 04/09/2019  . Postural dizziness with presyncope 02/25/2019  . Osteoporosis 08/21/2018  . Osteoarthritis of left hip 05/21/2018  . Bilateral carotid artery stenosis 04/19/2018  . History of syncope 10/17/2017  . MCI (mild cognitive impairment) 08/12/2017  . Lung nodules 07/09/2017  . Paroxysmal atrial fibrillation ([place]) 04/18/2017  . Pleural effusion 10/20/2016  . Hyponatremia 10/20/2016  . Chronic diastolic congestive heart failure ([place]) 06/28/2016  . Primary open-angle glaucoma, bilateral, mild stage 05/08/2016  . Labile blood glucose 04/27/2016  . Bladder prolapse, female, acquired 02/25/2016  . 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  . B12  deficiency 01/11/2016  . Anemia 01/10/2016  . CLL (chronic lymphocytic leukemia) ([place]) 01/10/2016  . 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 12/21/2015  . Bradycardia 12/21/2015  . Lymphocytosis 06/25/2015  . Impaired fasting glucose 06/22/2015  . Carotid atherosclerosis 06/22/2015  . Essential hypertension 12/22/2014  . Hyperlipidemia 12/22/2014  . Hypothyroidism 12/22/2014   Janna Arch, PT, DPT   06/03/2020, 12:08 PM  [place] MAIN [facility] SERVICES [address] [place], [place], [address] Phone: [phone]   Fax:  [phone]  Name: CHANIECE BARBATO MRN: [id number] Date of Birth: 10-11-1924

## 2020-06-07 ENCOUNTER — Ambulatory Visit: Payer: Medicare Other | Admitting: Hematology and Oncology

## 2020-06-07 ENCOUNTER — Other Ambulatory Visit: Payer: Medicare Other

## 2020-06-09 ENCOUNTER — Other Ambulatory Visit: Payer: Self-pay

## 2020-06-09 ENCOUNTER — Ambulatory Visit: Payer: Medicare Other

## 2020-06-09 DIAGNOSIS — M6281 Muscle weakness (generalized): Secondary | ICD-10-CM

## 2020-06-09 DIAGNOSIS — R262 Difficulty in walking, not elsewhere classified: Secondary | ICD-10-CM

## 2020-06-09 NOTE — Therapy (Signed)
[place] MAIN [facility] SERVICES [address] [place], [place], [address] Phone: [phone]   Fax:  [phone]  Physical Therapy Treatment  Patient Details  Name: Sydney Mcdaniel MRN: [id number] Date of Birth: [birth year] Referring Provider (PT): Delsa Grana   Encounter Date: 06/09/2020   PT End of Session - 06/09/20 1138    Visit Number 25    Number of Visits 34    Date for PT Re-Evaluation 06/16/20    PT Start Time 1105    PT Stop Time 1145    PT Time Calculation (min) 40 min    Equipment Utilized During Treatment Gait belt    Activity Tolerance Patient tolerated treatment well    Behavior During Therapy [facility] for tasks assessed/performed           Past Medical History:  Diagnosis Date  . Allergy   . Anemia   . Cataract   . CLL (chronic lymphocytic leukemia) ([place])   . Glaucoma   . H/O: hysterectomy    Total  . Hyperlipidemia   . Hypertension   . Hypothyroidism   . Impaired fasting glucose   . Lichen sclerosus   . Osteoporosis    Hips  . PAF (paroxysmal atrial fibrillation) ([place])    a. 06/15/2016 Event monitor: 4% afib burden; b. CHA2DS2VASc - 4-->Xarelto.  . Syncope    a. 03/2016 Echo: EF55-60%, no rwma, mild AI/MR, nl PASP; b. 03/2016 48h Holter: no significant arrhythmias/pauses; c. 03/2016 MV: mild apical defect, likely breast attenuation, nl EF, low risk; d. 05/2016 Event monitor: No significant arrhythmia; e. 05/2016 Event monitor: PAF (4%).    Past Surgical History:  Procedure Laterality Date  . APPENDECTOMY    . EYE SURGERY     Glaucoma  . TOTAL ABDOMINAL HYSTERECTOMY      There were no vitals filed for this visit.   Subjective Assessment - 06/09/20 1133    Subjective Pt reports doing well today. Pt denies any recent falls/stumbles since prior session. Pt denies any updates to medications or medical appointment since prior session. Pt reports good compliance with HEP when time permits. Thus far pt continues  to notice improvements with ease of ADL task performance.     Pertinent History Patient had HHPT 10 months ago. Before that she was in the hospital with BP issues over night. She was walking with spc outdoors, inside she holds to the walls.    Limitations Walking;Standing    How long can you stand comfortably? 15 minutes    How long can you walk comfortably? 5 mins    Patient Stated Goals to have better balance and walk better    Currently in Pain? No/denies          INTERVENTION THIS DATE:  -overground AMB 685f, SPC; has progressive weakness and mild gait ataxia in final 1012f this improves with seated rest; -author assesses BP after exercise 180s/60s, then 170s/60s (seated twice); similar value in standing *pt denies any other symptoms associated with onset stumbling *pt's DTR mentions known CAS bilat, managed conservatively *BP in 4 limbs without any obvious abnormality *no further issues in session   Supine BP assessment:  166/46 LLE 59 bpm 159/44 RLE 62 bpm 147/50 LUE 61 bpm  -STS with bball toss x10 -STS with bball slam x10 -seated rotation with ball taps at side -seated ball rotation turns with hand-off/catch x16 (DTR and pt seated back 2 back)   Advanced SPC training -lateral step overs c SPC  x16 (SPC on floor) -Fwd/retro step overs c SPC x10       PT Short Term Goals - 05/19/20 1457      PT SHORT TERM GOAL #1   Title Patient (> 18 years old) will complete five times sit to stand test in < 15 seconds indicating an increased LE strength and improved balance.    Time 4    Period Weeks    Status Partially Met    Target Date 05/19/20             PT Long Term Goals - 05/26/20 1343      PT LONG TERM GOAL #1   Title Patient will increase BLE gross strength to 4+/5 as to improve functional strength for independent gait, increased standing tolerance and increased ADL ability.    Baseline 8    Time 8    Period Weeks    Status Partially Met      PT LONG TERM  GOAL #2   Title Patient will increase Berg Balance score toi >45  to decrease falls risk.    Baseline '@eval'  28; 10/7: 39., 04/21/20=32/56    Time 8    Period Weeks    Status Partially Met      PT LONG TERM GOAL #3   Title Patient will reduce timed up and go to <11 seconds to reduce fall risk and demonstrate improved transfer/gait ability.    Baseline At eval 27sec, 10/7: 27sec (some confusion about sitting at end) eschews SPC use, 04/21/20=27.28 sec, 05/19/20=16.13sec    Time 8    Period Weeks    Status Partially Met      PT LONG TERM GOAL #4   Title Patient will increase 10 meter walk test to >1.40ms as to improve gait speed for better community ambulation and to reduce fall risk.    Baseline 48sec at eval; 10/7: 19sec self selected, 16sec fastest, 04/21/20/= .47 m/ sec, 05/19/20=.734mec    Time 8    Period Weeks    Status Partially Met                 Plan - 06/09/20 1141    Clinical Impression Statement Continued with current plan of care as laid out in evaluation and recent prior sessions. Pt remains motivated to advance progress toward goals in order to maximize independence and safety at home. Pt has some mild-moderate commencement and progression of gait ataxia at end of sustained AMB, no other symptoms, terminal vitals revealing of HTN c SPB >180s, known CAS, concern for exercise related cerebral hypoperfusion, but no clear etiology at this time, as symptoms resolve with rest. Subsequent ABI screening does not indicate any appearance of LE occlusive disease. Pt requires high level assistance and cuing for completion of exercises in order to provide adequate level of stimulation and perturbation. Author allows pt as much opportunity as possible to perform independent righting strategies, only stepping in when pt is unable to prevent falling to floor. Pt closely monitored throughout session for safe vitals response and to maximize patient safety during interventions. Pt  continues to demonstrate progress toward goals AEB progression of some interventions this date either in volume or intensity.   Personal Factors and Comorbidities Age;Comorbidity 1;Comorbidity 2    Comorbidities HTN, osteoporosis, visual impairment, back pain, incontenence, Cancer    Examination-Activity Limitations Bathing    Stability/Clinical Decision Making Stable/Uncomplicated    Clinical Decision Making Low    Rehab Potential Good    PT Frequency  2x / week    PT Duration 8 weeks    PT Treatment/Interventions Neuromuscular re-education;Therapeutic exercise;Therapeutic activities;Balance training;Functional mobility training;Gait training    PT Next Visit Plan FOTO; update HEP as needed    PT Home Exercise Plan needs a revision, renovation, redux    Consulted and Agree with Plan of Care Patient;Family member/caregiver    Family Member Consulted DTR           Patient will benefit from skilled therapeutic intervention in order to improve the following deficits and impairments:  Abnormal gait,Decreased activity tolerance,Decreased endurance,Decreased knowledge of use of DME,Impaired flexibility,Difficulty walking,Decreased balance,Pain  Visit Diagnosis: Muscle weakness (generalized)  Difficulty in walking, not elsewhere classified     Problem List Patient Active Problem List   Diagnosis Date Noted  . Normocytic anemia 05/31/2020  . Hematochezia 04/11/2020  . Acute colitis 04/11/2020  . GI bleeding 04/11/2020  . Iron deficiency anemia 03/08/2020  . Goals of care, counseling/discussion 03/08/2020  . 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 Sinus bradycardia 04/09/2019  . First degree AV block 04/09/2019  . Postural dizziness with presyncope 02/25/2019  . Osteoporosis 08/21/2018  . Osteoarthritis of left hip 05/21/2018  . Bilateral carotid artery stenosis 04/19/2018  . History of syncope 10/17/2017  . MCI (mild cognitive impairment) 08/12/2017  . Lung nodules  07/09/2017  . Paroxysmal atrial fibrillation ([place]) 04/18/2017  . Pleural effusion 10/20/2016  . Hyponatremia 10/20/2016  . Chronic diastolic congestive heart failure ([place]) 06/28/2016  . Primary open-angle glaucoma, bilateral, mild stage 05/08/2016  . Labile blood glucose 04/27/2016  . Bladder prolapse, female, acquired 02/25/2016  . 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  . B12 deficiency 01/11/2016  . Anemia 01/10/2016  . CLL (chronic lymphocytic leukemia) ([place]) 01/10/2016  . 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 12/21/2015  . Bradycardia 12/21/2015  . Lymphocytosis 06/25/2015  . Impaired fasting glucose 06/22/2015  . Carotid atherosclerosis 06/22/2015  . Essential hypertension 12/22/2014  . Hyperlipidemia 12/22/2014  . Hypothyroidism 12/22/2014   12:09 PM, 06/09/20 Etta Grandchild, PT, DPT Physical Therapist - [place] [phone]     Etta Grandchild 06/09/2020, 11:49 AM  [place] MAIN [facility] SERVICES [address] [place], [place], [address] Phone: [phone]   Fax:  [phone]  Name: Sydney Mcdaniel MRN: [id number] Date of Birth: [birth year]

## 2020-06-11 ENCOUNTER — Ambulatory Visit: Payer: Medicare Other

## 2020-06-11 ENCOUNTER — Other Ambulatory Visit: Payer: Self-pay

## 2020-06-11 DIAGNOSIS — M6281 Muscle weakness (generalized): Secondary | ICD-10-CM | POA: Diagnosis not present

## 2020-06-11 DIAGNOSIS — R262 Difficulty in walking, not elsewhere classified: Secondary | ICD-10-CM

## 2020-06-11 NOTE — Therapy (Signed)
[place] MAIN [facility] SERVICES [address] [place], [place], [address] Phone: [phone]   Fax:  [phone]  Physical Therapy Treatment  Patient Details  Name: Sydney Mcdaniel MRN: [id number] Date of Birth: [birth year] Referring Provider (PT): Delsa Grana   Encounter Date: 06/11/2020   PT End of Session - 06/11/20 0807    Visit Number 26    Number of Visits 34    Date for PT Re-Evaluation 06/16/20    PT Start Time 0759    PT Stop Time 0844    PT Time Calculation (min) 45 min    Equipment Utilized During Treatment Gait belt    Activity Tolerance Patient tolerated treatment well    Behavior During Therapy [facility] for tasks assessed/performed           Past Medical History:  Diagnosis Date  . Allergy   . Anemia   . Cataract   . CLL (chronic lymphocytic leukemia) ([place])   . Glaucoma   . H/O: hysterectomy    Total  . Hyperlipidemia   . Hypertension   . Hypothyroidism   . Impaired fasting glucose   . Lichen sclerosus   . Osteoporosis    Hips  . PAF (paroxysmal atrial fibrillation) ([place])    a. 06/15/2016 Event monitor: 4% afib burden; b. CHA2DS2VASc - 4-->Xarelto.  . Syncope    a. 03/2016 Echo: EF55-60%, no rwma, mild AI/MR, nl PASP; b. 03/2016 48h Holter: no significant arrhythmias/pauses; c. 03/2016 MV: mild apical defect, likely breast attenuation, nl EF, low risk; d. 05/2016 Event monitor: No significant arrhythmia; e. 05/2016 Event monitor: PAF (4%).    Past Surgical History:  Procedure Laterality Date  . APPENDECTOMY    . EYE SURGERY     Glaucoma  . TOTAL ABDOMINAL HYSTERECTOMY      There were no vitals filed for this visit.   Subjective Assessment - 06/11/20 0804    Subjective Patient reports that she was careful to eat before session today. No falls or LOB, birthday is m[birth year].    Pertinent History Patient had HHPT 10 months ago. Before that she was in the hospital with BP issues over night. She was walking with spc  outdoors, inside she holds to the walls.    Limitations Walking;Standing    How long can you stand comfortably? 15 minutes    How long can you walk comfortably? 5 mins    Patient Stated Goals to have better balance and walk better    Currently in Pain? No/denies                  Ther-ex  Nu-step L3 x 4 minutes for cardiovascular challenge RTB around ankles: side step 4x length of  // bars   RTB seated hamstring curl 10x each LE   Neuromuscular Re-education by support bar:  bosu ball: forward modified lunge with decreasing UE support 15x each LE bosu ball : lateral modified lunge with decreasing UE support Orange hurdle: step over and back with decreasing UE support to finger tip support, requires cueing of sticky note for LLE. 15x each LE opp LE step with opp UE reach 15x each side for contralateral coordination  airex pad: hedgehog taps with finger tip support 6x each LE airex pad: grab ball and toss at target x 12 balls for pertubation's on unstable surfaces  Seated: Opposite UE/LE raises 15x, very challenging for cross body coordination    Patient performed with instruction, verbal cues, tactile cues of  therapist: goal: increase tissue extensibility, promote proper posture, improve mobility   vitals monitored throughout session.                    PT Education - 06/11/20 0805    Education Details exercise technique, body mechanics    Person(s) Educated Patient    Methods Explanation;Demonstration;Tactile cues;Verbal cues    Comprehension Verbalized understanding;Returned demonstration;Verbal cues required;Tactile cues required            PT Short Term Goals - 05/19/20 1457      PT SHORT TERM GOAL #1   Title Patient (> 78 years old) will complete five times sit to stand test in < 15 seconds indicating an increased LE strength and improved balance.    Time 4    Period Weeks    Status Partially Met    Target Date 05/19/20             PT  Long Term Goals - 05/26/20 1343      PT LONG TERM GOAL #1   Title Patient will increase BLE gross strength to 4+/5 as to improve functional strength for independent gait, increased standing tolerance and increased ADL ability.    Baseline 8    Time 8    Period Weeks    Status Partially Met      PT LONG TERM GOAL #2   Title Patient will increase Berg Balance score toi >45  to decrease falls risk.    Baseline '@eval'  28; 10/7: 39., 04/21/20=32/56    Time 8    Period Weeks    Status Partially Met      PT LONG TERM GOAL #3   Title Patient will reduce timed up and go to <11 seconds to reduce fall risk and demonstrate improved transfer/gait ability.    Baseline At eval 27sec, 10/7: 27sec (some confusion about sitting at end) eschews SPC use, 04/21/20=27.28 sec, 05/19/20=16.13sec    Time 8    Period Weeks    Status Partially Met      PT LONG TERM GOAL #4   Title Patient will increase 10 meter walk test to >1.50ms as to improve gait speed for better community ambulation and to reduce fall risk.    Baseline 48sec at eval; 10/7: 19sec self selected, 16sec fastest, 04/21/20/= .47 m/ sec, 05/19/20=.759mec    Time 8    Period Weeks    Status Partially Met                 Plan - 06/11/20 1219    Clinical Impression Statement Patient is challenged by contralateral coordination interventions and will continue to benefit from cognitive physical interventions. Her stabilization and ability to retain COM with decreasing UE support is improving on stable and unstable surfaces. The patient continues to benefit from additional skilled PT intervention to further improve balance and overall strength for improved confidence and ability to ambulate household and community distances    Personal Factors and Comorbidities Age;Comorbidity 1;Comorbidity 2    Comorbidities HTN, osteoporosis, visual impairment, back pain, incontenence, Cancer    Examination-Activity Limitations Bathing     Stability/Clinical Decision Making Stable/Uncomplicated    Rehab Potential Good    PT Frequency 2x / week    PT Duration 8 weeks    PT Treatment/Interventions Neuromuscular re-education;Therapeutic exercise;Therapeutic activities;Balance training;Functional mobility training;Gait training    PT Next Visit Plan FOTO; update HEP as needed    PT Home Exercise Plan needs a revision, renovation, redux  Consulted and Agree with Plan of Care Patient;Family member/caregiver    Family Member Consulted DTR           Patient will benefit from skilled therapeutic intervention in order to improve the following deficits and impairments:  Abnormal gait,Decreased activity tolerance,Decreased endurance,Decreased knowledge of use of DME,Impaired flexibility,Difficulty walking,Decreased balance,Pain  Visit Diagnosis: Muscle weakness (generalized)  Difficulty in walking, not elsewhere classified     Problem List Patient Active Problem List   Diagnosis Date Noted  . Normocytic anemia 05/31/2020  . Hematochezia 04/11/2020  . Acute colitis 04/11/2020  . GI bleeding 04/11/2020  . Iron deficiency anemia 03/08/2020  . Goals of care, counseling/discussion 03/08/2020  . 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 Sinus bradycardia 04/09/2019  . First degree AV block 04/09/2019  . Postural dizziness with presyncope 02/25/2019  . Osteoporosis 08/21/2018  . Osteoarthritis of left hip 05/21/2018  . Bilateral carotid artery stenosis 04/19/2018  . History of syncope 10/17/2017  . MCI (mild cognitive impairment) 08/12/2017  . Lung nodules 07/09/2017  . Paroxysmal atrial fibrillation ([place]) 04/18/2017  . Pleural effusion 10/20/2016  . Hyponatremia 10/20/2016  . Chronic diastolic congestive heart failure ([place]) 06/28/2016  . Primary open-angle glaucoma, bilateral, mild stage 05/08/2016  . Labile blood glucose 04/27/2016  . Bladder prolapse, female, acquired 02/25/2016  . TMJ dysfunction  01/13/2016  . B12 deficiency 01/11/2016  . Anemia 01/10/2016  . CLL (chronic lymphocytic leukemia) ([place]) 01/10/2016  . 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 12/21/2015  . Bradycardia 12/21/2015  . Lymphocytosis 06/25/2015  . Impaired fasting glucose 06/22/2015  . Carotid atherosclerosis 06/22/2015  . Essential hypertension 12/22/2014  . Hyperlipidemia 12/22/2014  . Hypothyroidism 12/22/2014   Janna Arch, PT, DPT   06/11/2020, 12:21 PM  [place] MAIN [facility] SERVICES [address] [place], [place], [address] Phone: [phone]   Fax:  [phone]  Name: Sydney Mcdaniel MRN: [id number] Date of Birth: 1924/12/07

## 2020-06-14 ENCOUNTER — Ambulatory Visit: Payer: Medicare Other

## 2020-06-14 ENCOUNTER — Other Ambulatory Visit: Payer: Self-pay

## 2020-06-14 DIAGNOSIS — M6281 Muscle weakness (generalized): Secondary | ICD-10-CM

## 2020-06-14 DIAGNOSIS — R262 Difficulty in walking, not elsewhere classified: Secondary | ICD-10-CM

## 2020-06-14 NOTE — Therapy (Signed)
[place] MAIN [facility] SERVICES [address] [place], [place], [address] Phone: [phone]   Fax:  [phone]  Physical Therapy Treatment  Patient Details  Name: Sydney Mcdaniel MRN: [id number] Date of Birth: 10/06/1924 Referring Provider (PT): Delsa Grana   Encounter Date: 06/14/2020   PT End of Session - 06/14/20 1304    Visit Number 27    Number of Visits 34    Date for PT Re-Evaluation 06/16/20    PT Start Time 1102    PT Stop Time 1146    PT Time Calculation (min) 44 min    Equipment Utilized During Treatment Gait belt    Activity Tolerance Patient tolerated treatment well    Behavior During Therapy [facility] for tasks assessed/performed           Past Medical History:  Diagnosis Date  . Allergy   . Anemia   . Cataract   . CLL (chronic lymphocytic leukemia) ([place])   . Glaucoma   . H/O: hysterectomy    Total  . Hyperlipidemia   . Hypertension   . Hypothyroidism   . Impaired fasting glucose   . Lichen sclerosus   . Osteoporosis    Hips  . PAF (paroxysmal atrial fibrillation) ([place])    a. 06/15/2016 Event monitor: 4% afib burden; b. CHA2DS2VASc - 4-->Xarelto.  . Syncope    a. 03/2016 Echo: EF55-60%, no rwma, mild AI/MR, nl PASP; b. 03/2016 48h Holter: no significant arrhythmias/pauses; c. 03/2016 MV: mild apical defect, likely breast attenuation, nl EF, low risk; d. 05/2016 Event monitor: No significant arrhythmia; e. 05/2016 Event monitor: PAF (4%).    Past Surgical History:  Procedure Laterality Date  . APPENDECTOMY    . EYE SURGERY     Glaucoma  . TOTAL ABDOMINAL HYSTERECTOMY      There were no vitals filed for this visit.   Subjective Assessment - 06/14/20 1301    Subjective Patient reports compliance with HEP. Its her birthday today. Was able to walk to serve herself yesterday at church for the first time.    Pertinent History Patient had HHPT 10 months ago. Before that she was in the hospital with BP issues over  night. She was walking with spc outdoors, inside she holds to the walls.    Limitations Walking;Standing    How long can you stand comfortably? 15 minutes    How long can you walk comfortably? 5 mins    Patient Stated Goals to have better balance and walk better    Currently in Pain? No/denies                 Ther-ex Sumo squat with UE support on bar in front, cues for depth of squat 10x Green step lateral step up/down 10x each direction finger tip support.  RTB around ankles: lateral stepping with cues for foot clearance 4x length of support bar seated LAQ squeezing green ball between ankles 10x  RTB around ankles: seated alternating LAQ without back support 10x each LE   Neuromuscular Re-educationby support bar: airex pad: green step modified tandem stance 2x60 seconds each LE placement Green step: toe taps holding onto basketball. airex pad: horizontal head turns 10x airex pad: vertical head turns 10x, no LOB this session Green step: forward step up, then step down, step backwards back up, then down 6x each LE, SUE support;  Ambulate with gentle toss with basketball 40 ft x 2 trials, improved with repetition.  Weave between 6 cones, knocking  over one cone x 4 trials.   Patient performed with instruction, verbal cues, tactile cues of therapist: goal:increase tissue extensibility, promote proper posture, improve mobility                      PT Education - 06/14/20 1303    Education Details exercise technique, body mechanics    Person(s) Educated Patient    Methods Explanation;Demonstration;Tactile cues;Verbal cues    Comprehension Verbalized understanding;Returned demonstration;Verbal cues required;Tactile cues required            PT Short Term Goals - 05/19/20 1457      PT SHORT TERM GOAL #1   Title Patient (> 53 years old) will complete five times sit to stand test in < 15 seconds indicating an increased LE strength and improved balance.     Time 4    Period Weeks    Status Partially Met    Target Date 05/19/20             PT Long Term Goals - 05/26/20 1343      PT LONG TERM GOAL #1   Title Patient will increase BLE gross strength to 4+/5 as to improve functional strength for independent gait, increased standing tolerance and increased ADL ability.    Baseline 8    Time 8    Period Weeks    Status Partially Met      PT LONG TERM GOAL #2   Title Patient will increase Berg Balance score toi >45  to decrease falls risk.    Baseline _0  28; 10/7: 39., 04/21/20=32/56    Time 8    Period Weeks    Status Partially Met      PT LONG TERM GOAL #3   Title Patient will reduce timed up and go to <11 seconds to reduce fall risk and demonstrate improved transfer/gait ability.    Baseline At eval 27sec, 10/7: 27sec (some confusion about sitting at end) eschews SPC use, 04/21/20=27.28 sec, 05/19/20=16.13sec    Time 8    Period Weeks    Status Partially Met      PT LONG TERM GOAL #4   Title Patient will increase 10 meter walk test to >1.70ms as to improve gait speed for better community ambulation and to reduce fall risk.    Baseline 48sec at eval; 10/7: 19sec self selected, 16sec fastest, 04/21/20/= .47 m/ sec, 05/19/20=.[id number]    Time 8    Period Weeks    Status Partially Met                 Plan - 06/14/20 1305    Clinical Impression Statement Patient continues to demonstrate excellent progression towards functional goals at this time. She is able to negotiate obstacles with only one episode of instability knocking over cone. Her ability to stabilize herself on unstable surfaces with dual task of head movements is improving with no episodes of LOB. The patient continues to benefit from additional skilled PT intervention to further improve balance and overall strength for improved confidence and ability to ambulate household and community distances    Personal Factors and Comorbidities Age;Comorbidity  1;Comorbidity 2    Comorbidities HTN, osteoporosis, visual impairment, back pain, incontenence, Cancer    Examination-Activity Limitations Bathing    Stability/Clinical Decision Making Stable/Uncomplicated    Rehab Potential Good    PT Frequency 2x / week    PT Duration 8 weeks    PT Treatment/Interventions Neuromuscular re-education;Therapeutic exercise;Therapeutic activities;Balance training;Functional mobility  training;Gait training    PT Next Visit Plan recert    PT Home Exercise Plan needs a revision, renovation, redux    Consulted and Agree with Plan of Care Patient;Family member/caregiver    Family Member Consulted DTR           Patient will benefit from skilled therapeutic intervention in order to improve the following deficits and impairments:  Abnormal gait,Decreased activity tolerance,Decreased endurance,Decreased knowledge of use of DME,Impaired flexibility,Difficulty walking,Decreased balance,Pain  Visit Diagnosis: Muscle weakness (generalized)  Difficulty in walking, not elsewhere classified     Problem List Patient Active Problem List   Diagnosis Date Noted  . Normocytic anemia 05/31/2020  . Hematochezia 04/11/2020  . Acute colitis 04/11/2020  . GI bleeding 04/11/2020  . Iron deficiency anemia 03/08/2020  . Goals of care, counseling/discussion 03/08/2020  . 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 Sinus bradycardia 04/09/2019  . First degree AV block 04/09/2019  . Postural dizziness with presyncope 02/25/2019  . Osteoporosis 08/21/2018  . Osteoarthritis of left hip 05/21/2018  . Bilateral carotid artery stenosis 04/19/2018  . History of syncope 10/17/2017  . MCI (mild cognitive impairment) 08/12/2017  . Lung nodules 07/09/2017  . Paroxysmal atrial fibrillation ([place]) 04/18/2017  . Pleural effusion 10/20/2016  . Hyponatremia 10/20/2016  . Chronic diastolic congestive heart failure ([place]) 06/28/2016  . Primary open-angle glaucoma, bilateral,  mild stage 05/08/2016  . Labile blood glucose 04/27/2016  . Bladder prolapse, female, acquired 02/25/2016  . 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  . B12 deficiency 01/11/2016  . Anemia 01/10/2016  . CLL (chronic lymphocytic leukemia) ([place]) 01/10/2016  . 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 12/21/2015  . Bradycardia 12/21/2015  . Lymphocytosis 06/25/2015  . Impaired fasting glucose 06/22/2015  . Carotid atherosclerosis 06/22/2015  . Essential hypertension 12/22/2014  . Hyperlipidemia 12/22/2014  . Hypothyroidism 12/22/2014   Janna Arch, PT, DPT   06/14/2020, 1:06 PM  [place] MAIN [facility] SERVICES [address] [place], [place], [address] Phone: [phone]   Fax:  [phone]  Name: Sydney Mcdaniel MRN: [id number] Date of Birth: Dec 29, 1924

## 2020-06-16 ENCOUNTER — Other Ambulatory Visit: Payer: Self-pay

## 2020-06-16 ENCOUNTER — Ambulatory Visit: Payer: Medicare Other

## 2020-06-16 DIAGNOSIS — M6281 Muscle weakness (generalized): Secondary | ICD-10-CM | POA: Diagnosis not present

## 2020-06-16 DIAGNOSIS — R262 Difficulty in walking, not elsewhere classified: Secondary | ICD-10-CM

## 2020-06-16 NOTE — Therapy (Signed)
[place] MAIN [facility] SERVICES [address] [place], [place], [address] Phone: [phone]   Fax:  [phone]  Physical Therapy Treatment/RECERT  Patient Details  Name: Sydney Mcdaniel MRN: [id number] Date of Birth: Jun 05, 1925 Referring Provider (PT): Delsa Grana   Encounter Date: 06/16/2020   PT End of Session - 06/16/20 1240    Visit Number 28    Number of Visits 44    Date for PT Re-Evaluation 08/11/20    PT Start Time 1115    PT Stop Time 1159    PT Time Calculation (min) 44 min    Equipment Utilized During Treatment Gait belt    Activity Tolerance Patient tolerated treatment well    Behavior During Therapy [facility] for tasks assessed/performed           Past Medical History:  Diagnosis Date  . Allergy   . Anemia   . Cataract   . CLL (chronic lymphocytic leukemia) ([place])   . Glaucoma   . H/O: hysterectomy    Total  . Hyperlipidemia   . Hypertension   . Hypothyroidism   . Impaired fasting glucose   . Lichen sclerosus   . Osteoporosis    Hips  . PAF (paroxysmal atrial fibrillation) ([place])    a. 06/15/2016 Event monitor: 4% afib burden; b. CHA2DS2VASc - 4-->Xarelto.  . Syncope    a. 03/2016 Echo: EF55-60%, no rwma, mild AI/MR, nl PASP; b. 03/2016 48h Holter: no significant arrhythmias/pauses; c. 03/2016 MV: mild apical defect, likely breast attenuation, nl EF, low risk; d. 05/2016 Event monitor: No significant arrhythmia; e. 05/2016 Event monitor: PAF (4%).    Past Surgical History:  Procedure Laterality Date  . APPENDECTOMY    . EYE SURGERY     Glaucoma  . TOTAL ABDOMINAL HYSTERECTOMY      There were no vitals filed for this visit.   Subjective Assessment - 06/16/20 1238    Subjective Patient had a fall last night when reaching to turn a light on into the bedroom, tried to scoot to get herself up but was not able to, needed help from daughter to get into standing.    Pertinent History Patient had HHPT 10 months ago.  Before that she was in the hospital with BP issues over night. She was walking with spc outdoors, inside she holds to the walls.    Limitations Walking;Standing    How long can you stand comfortably? 15 minutes    How long can you walk comfortably? 5 mins    Patient Stated Goals to have better balance and walk better    Currently in Pain? Yes    Pain Score 2     Pain Location Foot    Pain Orientation Right    Pain Descriptors / Indicators Aching    Pain Type Acute pain    Pain Onset Yesterday    Pain Frequency Constant              [facility] PT Assessment - 06/16/20 0001      Standardized Balance Assessment   Standardized Balance Assessment Berg Balance Test;10 meter walk test;Timed Up and Go Test      Berg Balance Test   Sit to Stand Able to stand without using hands and stabilize independently    Standing Unsupported Able to stand safely 2 minutes    Sitting with Back Unsupported but Feet Supported on Floor or Stool Able to sit safely and securely 2 minutes    Stand to  Sit Sits safely with minimal use of hands    Transfers Able to transfer safely, minor use of hands    Standing Unsupported with Eyes Closed Able to stand 10 seconds safely    Standing Unsupported with Feet Together Able to place feet together independently and stand for 1 minute with supervision    From Standing, Reach Forward with Outstretched Arm Can reach forward >12 cm safely (5")    From Standing Position, Pick up Object from Floor Able to pick up shoe, needs supervision    From Standing Position, Turn to Look Behind Over each Shoulder Looks behind from both sides and weight shifts well    Turn 360 Degrees Able to turn 360 degrees safely in 4 seconds or less    Standing Unsupported, Alternately Place Feet on Step/Stool Able to stand independently and complete 8 steps >20 seconds    Standing Unsupported, One Foot in Front Able to take small step independently and hold 30 seconds    Standing on One Leg Able to  lift leg independently and hold equal to or more than 3 seconds    Total Score 48            goals:  BERG: 48/ 56  TUG 15. 8 seconds with no AD  10 MWT: 15.5 seconds with SPC and 19 seconds with out AD  FOTO: 54%  5x STS: 16 seconds     Treatment:  Patient and patient's daughter educated on use of gait belt  With daughter demonstrating understanding.  Patient and patient's daughter educated on fall recovery: getting up and down from the floor safely with use of plinth table and matt for protection of joint. Cues for sequencing from standing to tall kneeling, to half kneeling to sitting and then reverse. Use of Min A to pelvis to assist to hands and knees position as well as to bring RLE forward into half kneel with UE's on plinth table. Standing with cues to bring L foot forward with CGA to min A with repetition turn and sit on table. Improved with each repetition         Patient will benefit from one last recert with focus on safe negotiation of home, capacity for functional ambulation, and ability to get/down from ground.  She demonstrates excellent progression towards functional goals with improved balance as seen in BERG progression (meeting goal and progressing goal), strength, and gait speed with decreased reliance upon AD.  Patient is slightly more fearful today due to recent fall but continues to be highly motivated throughout session. The patient continues to benefit from additional skilled PT intervention to further improve balance and overall strength for improved confidence and ability to ambulate household and community distances              PT Education - 06/16/20 1239    Education Details goals, POC, getting up/down from floor    Person(s) Educated Patient;Child(ren)    Methods Explanation;Demonstration;Tactile cues;Verbal cues    Comprehension Verbalized understanding;Returned demonstration;Verbal cues required;Tactile cues required            PT Short  Term Goals - 06/16/20 1224      PT SHORT TERM GOAL #1   Title Patient (> 69 years old) will complete five times sit to stand test in < 15 seconds indicating an increased LE strength and improved balance.    Baseline 12/22: 16seconds    Time 4    Period Weeks    Status Partially Met  Target Date 07/14/20             PT Long Term Goals - 06/16/20 1225      PT LONG TERM GOAL #1   Title Patient will increase BLE gross strength to 4+/5 as to improve functional strength for independent gait, increased standing tolerance and increased ADL ability.    Baseline 12/22: grossly 4/5 LLE with RLE 4+/5    Time 8    Period Weeks    Status Partially Met    Target Date 08/11/20      PT LONG TERM GOAL #2   Title Patient will increase Berg Balance score toi >45  to decrease falls risk.    Baseline _0  28; 10/7: 39., 04/21/20=32/56 12/22: 48/56    Time 8    Period Weeks    Status Achieved      PT LONG TERM GOAL #3   Title Patient will reduce timed up and go to <11 seconds to reduce fall risk and demonstrate improved transfer/gait ability.    Baseline At eval 27sec, 10/7: 27sec (some confusion about sitting at end) eschews SPC use, 04/21/20=27.28 sec, 05/19/20=16.13sec 12/22: 15.8 seconds w/o AD    Time 8    Period Weeks    Status Partially Met    Target Date 08/11/20      PT LONG TERM GOAL #4   Title Patient will increase 10 meter walk test to >1.26ms as to improve gait speed for better community ambulation and to reduce fall risk.    Baseline 48sec at eval; 10/7: 19sec self selected, 16sec fastest, 04/21/20/= .47 m/ sec, 05/19/20=.[id number] 12/22: 0.67 m/s w AD 19 seconds w/o AD    Time 8    Period Weeks    Status Partially Met    Target Date 08/11/20      PT LONG TERM GOAL #5   Title Patient will increase FOTO score to equal to or greater than 58 to demonstrate statistically significant improvement in mobility and quality of life.    Baseline 12/22: 54%    Time 8    Period Weeks     Status New    Target Date 08/11/20      Additional Long Term Goals   Additional Long Term Goals Yes      PT LONG TERM GOAL #6   Title Patient will increase Berg Balance by > 4 points (52/56) to demonstrate decreased fall risk during functional activities.    Baseline 12/22: 48/56    Time 8    Period Weeks    Status New    Target Date 08/11/20                 Plan - 06/16/20 1242    Clinical Impression Statement Patient will benefit from one last recert with focus on safe negotiation of home, capacity for functional ambulation, and ability to get/down from ground.  She demonstrates excellent progression towards functional goals with improved balance as seen in BERG progression (meeting goal and progressing goal), strength, and gait speed with decreased reliance upon AD.  Patient is slightly more fearful today due to recent fall but continues to be highly motivated throughout session. The patient continues to benefit from additional skilled PT intervention to further improve balance and overall strength for improved confidence and ability to ambulate household and community distances    Personal Factors and Comorbidities Age;Comorbidity 1;Comorbidity 2    Comorbidities HTN, osteoporosis, visual impairment, back pain, incontenence, Cancer    Examination-Activity Limitations  Bathing    Stability/Clinical Decision Making Stable/Uncomplicated    Rehab Potential Good    PT Frequency 2x / week    PT Duration 8 weeks    PT Treatment/Interventions Neuromuscular re-education;Therapeutic exercise;Therapeutic activities;Balance training;Functional mobility training;Gait training    PT Next Visit Plan getting up/down floor , balance, walking progression    PT Home Exercise Plan needs a revision, renovation, redux    Consulted and Agree with Plan of Care Patient;Family member/caregiver    Family Member Consulted DTR           Patient will benefit from skilled therapeutic intervention in  order to improve the following deficits and impairments:  Abnormal gait,Decreased activity tolerance,Decreased endurance,Decreased knowledge of use of DME,Impaired flexibility,Difficulty walking,Decreased balance,Pain  Visit Diagnosis: Muscle weakness (generalized)  Difficulty in walking, not elsewhere classified     Problem List Patient Active Problem List   Diagnosis Date Noted  . Normocytic anemia 05/31/2020  . Hematochezia 04/11/2020  . Acute colitis 04/11/2020  . GI bleeding 04/11/2020  . Iron deficiency anemia 03/08/2020  . Goals of care, counseling/discussion 03/08/2020  . 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 Sinus bradycardia 04/09/2019  . First degree AV block 04/09/2019  . Postural dizziness with presyncope 02/25/2019  . Osteoporosis 08/21/2018  . Osteoarthritis of left hip 05/21/2018  . Bilateral carotid artery stenosis 04/19/2018  . History of syncope 10/17/2017  . MCI (mild cognitive impairment) 08/12/2017  . Lung nodules 07/09/2017  . Paroxysmal atrial fibrillation ([place]) 04/18/2017  . Pleural effusion 10/20/2016  . Hyponatremia 10/20/2016  . Chronic diastolic congestive heart failure ([place]) 06/28/2016  . Primary open-angle glaucoma, bilateral, mild stage 05/08/2016  . Labile blood glucose 04/27/2016  . Bladder prolapse, female, acquired 02/25/2016  . 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  . B12 deficiency 01/11/2016  . Anemia 01/10/2016  . CLL (chronic lymphocytic leukemia) ([place]) 01/10/2016  . 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 12/21/2015  . Bradycardia 12/21/2015  . Lymphocytosis 06/25/2015  . Impaired fasting glucose 06/22/2015  . Carotid atherosclerosis 06/22/2015  . Essential hypertension 12/22/2014  . Hyperlipidemia 12/22/2014  . Hypothyroidism 12/22/2014   Janna Arch, PT, DPT   06/16/2020, 12:43 PM  [place] MAIN [facility]  SERVICES [address] [place], [place], [address] Phone: [phone]   Fax:  [phone]  Name: Sydney Mcdaniel MRN: [id number] Date of Birth: August 04, 1924

## 2020-06-21 ENCOUNTER — Telehealth: Payer: Self-pay | Admitting: Hematology and Oncology

## 2020-06-21 ENCOUNTER — Ambulatory Visit: Payer: Medicare Other

## 2020-06-24 ENCOUNTER — Ambulatory Visit: Payer: Medicare Other

## 2020-06-28 ENCOUNTER — Inpatient Hospital Stay: Payer: Medicare Other

## 2020-06-28 ENCOUNTER — Telehealth: Payer: Self-pay

## 2020-06-28 ENCOUNTER — Encounter: Payer: Self-pay | Admitting: Family

## 2020-06-28 MED ORDER — RIVAROXABAN 15 MG PO TABS: 15.0000 mg | ORAL_TABLET | Freq: Every day | ORAL | 2 refills | Status: DC

## 2020-06-28 NOTE — Telephone Encounter (Signed)
Called patients daughter and informed her that I sent her prescription into [facility] as requested.

## 2020-06-30 ENCOUNTER — Ambulatory Visit: Payer: Medicare Other

## 2020-07-02 ENCOUNTER — Ambulatory Visit: Payer: Medicare Other

## 2020-07-05 ENCOUNTER — Inpatient Hospital Stay: Payer: Medicare Other | Attending: Hematology and Oncology

## 2020-07-05 ENCOUNTER — Other Ambulatory Visit: Payer: Self-pay

## 2020-07-05 DIAGNOSIS — E538 Deficiency of other specified B group vitamins: Secondary | ICD-10-CM | POA: Diagnosis not present

## 2020-07-05 MED ORDER — CYANOCOBALAMIN 1000 MCG/ML IJ SOLN
1000.0000 ug | Freq: Once | INTRAMUSCULAR | Status: AC
  Administered 2020-07-05: 1000 ug via INTRAMUSCULAR

## 2020-07-05 MED ORDER — CYANOCOBALAMIN 1000 MCG/ML IJ SOLN
INTRAMUSCULAR | Status: AC
  Filled 2020-07-05: qty 1

## 2020-07-06 ENCOUNTER — Ambulatory Visit: Payer: Medicare Other | Attending: Family Medicine

## 2020-07-06 DIAGNOSIS — R262 Difficulty in walking, not elsewhere classified: Secondary | ICD-10-CM | POA: Diagnosis not present

## 2020-07-06 DIAGNOSIS — M6281 Muscle weakness (generalized): Secondary | ICD-10-CM | POA: Insufficient documentation

## 2020-07-06 NOTE — Therapy (Signed)
[place] MAIN [facility] SERVICES [address] [place], [place], [address] Phone: [phone]   Fax:  [phone]  Physical Therapy Treatment  Patient Details  Name: Sydney Mcdaniel MRN: [id number] Date of Birth: 07-01-1924 Referring Provider (PT): Delsa Grana   Encounter Date: 07/06/2020   PT End of Session - 07/06/20 1245    Visit Number 29    Number of Visits 44    Date for PT Re-Evaluation 08/11/20    PT Start Time 0845    PT Stop Time 0929    PT Time Calculation (min) 44 min    Equipment Utilized During Treatment Gait belt    Activity Tolerance Patient tolerated treatment well    Behavior During Therapy [facility] for tasks assessed/performed           Past Medical History:  Diagnosis Date  . Allergy   . Anemia   . Cataract   . CLL (chronic lymphocytic leukemia) ([place])   . Glaucoma   . H/O: hysterectomy    Total  . Hyperlipidemia   . Hypertension   . Hypothyroidism   . Impaired fasting glucose   . Lichen sclerosus   . Osteoporosis    Hips  . PAF (paroxysmal atrial fibrillation) ([place])    a. 06/15/2016 Event monitor: 4% afib burden; b. CHA2DS2VASc - 4-->Xarelto.  . Syncope    a. 03/2016 Echo: EF55-60%, no rwma, mild AI/MR, nl PASP; b. 03/2016 48h Holter: no significant arrhythmias/pauses; c. 03/2016 MV: mild apical defect, likely breast attenuation, nl EF, low risk; d. 05/2016 Event monitor: No significant arrhythmia; e. 05/2016 Event monitor: PAF (4%).    Past Surgical History:  Procedure Laterality Date  . APPENDECTOMY    . EYE SURGERY     Glaucoma  . TOTAL ABDOMINAL HYSTERECTOMY      There were no vitals filed for this visit.   Subjective Assessment - 07/06/20 1242    Subjective Patient is returning to PT after COVID exposure requiring quarentine. No falls or LOB, has been compliant with HEP. Got new walking sticks but has not adjusted them yet.    Pertinent History Patient had HHPT 10 months ago. Before that she was in  the hospital with BP issues over night. She was walking with spc outdoors, inside she holds to the walls.    Limitations Walking;Standing    How long can you stand comfortably? 15 minutes    How long can you walk comfortably? 5 mins    Patient Stated Goals to have better balance and walk better    Currently in Pain? No/denies              Treatment: Orange hurdle step overs 12x each LE, BUE support  airex pad 6" step tandem tance 2x30 econds airex pad 6" step SUE support toe taps 12x each LE  airex pad: static stand, reach and grab a ball and throw into hoop 20 balls, one near LOB Static stand rainbow ball chest press 10x Static stand rainbow ball overhead raise 10x  Step over multiple objects of varying heights (hurdles, half foam rollers) with BUE support; improving steppage and foot clearance with increasing demand to step length x 10 lengths of // bars   Sit to stand x 8   Pt educated throughout session about proper posture and technique with exercises. Improved exercise technique, movement at target joints, use of target muscles after min to mod verbal, visual, tactile cues.  PT Education - 07/06/20 1244    Education Details exercise technique, body mechanics    Person(s) Educated Patient    Methods Explanation;Demonstration;Tactile cues;Verbal cues    Comprehension Verbalized understanding;Returned demonstration;Verbal cues required;Tactile cues required            PT Short Term Goals - 06/16/20 1224      PT SHORT TERM GOAL #1   Title Patient (> 16 years old) will complete five times sit to stand test in < 15 seconds indicating an increased LE strength and improved balance.    Baseline 12/22: 16seconds    Time 4    Period Weeks    Status Partially Met    Target Date 07/14/20             PT Long Term Goals - 06/16/20 1225      PT LONG TERM GOAL #1   Title Patient will increase BLE gross strength to 4+/5 as to improve  functional strength for independent gait, increased standing tolerance and increased ADL ability.    Baseline 12/22: grossly 4/5 LLE with RLE 4+/5    Time 8    Period Weeks    Status Partially Met    Target Date 08/11/20      PT LONG TERM GOAL #2   Title Patient will increase Berg Balance score toi >45  to decrease falls risk.    Baseline '@eval'  28; 10/7: 39., 04/21/20=32/56 12/22: 48/56    Time 8    Period Weeks    Status Achieved      PT LONG TERM GOAL #3   Title Patient will reduce timed up and go to <11 seconds to reduce fall risk and demonstrate improved transfer/gait ability.    Baseline At eval 27sec, 10/7: 27sec (some confusion about sitting at end) eschews SPC use, 04/21/20=27.28 sec, 05/19/20=16.13sec 12/22: 15.8 seconds w/o AD    Time 8    Period Weeks    Status Partially Met    Target Date 08/11/20      PT LONG TERM GOAL #4   Title Patient will increase 10 meter walk test to >1.20ms as to improve gait speed for better community ambulation and to reduce fall risk.    Baseline 48sec at eval; 10/7: 19sec self selected, 16sec fastest, 04/21/20/= .47 m/ sec, 05/19/20=.[id number] 12/22: 0.67 m/s w AD 19 seconds w/o AD    Time 8    Period Weeks    Status Partially Met    Target Date 08/11/20      PT LONG TERM GOAL #5   Title Patient will increase FOTO score to equal to or greater than 58 to demonstrate statistically significant improvement in mobility and quality of life.    Baseline 12/22: 54%    Time 8    Period Weeks    Status New    Target Date 08/11/20      Additional Long Term Goals   Additional Long Term Goals Yes      PT LONG TERM GOAL #6   Title Patient will increase Berg Balance by > 4 points (52/56) to demonstrate decreased fall risk during functional activities.    Baseline 12/22: 48/56    Time 8    Period Weeks    Status New    Target Date 08/11/20                 Plan - 07/06/20 1257    Clinical Impression Statement Patient is returning to PT  after absence due  to recent COVID exposure requiring quarantine. Her new walking sticks were brought in to be adjusted however PT found that one was malfunctioning so daughter will return to store for a new one. The patient continues to benefit from additional skilled PT intervention to further improve balance and overall strength for improved confidence and ability to ambulate household and community distances    Personal Factors and Comorbidities Age;Comorbidity 1;Comorbidity 2    Comorbidities HTN, osteoporosis, visual impairment, back pain, incontenence, Cancer    Examination-Activity Limitations Bathing    Stability/Clinical Decision Making Stable/Uncomplicated    Rehab Potential Good    PT Frequency 2x / week    PT Duration 8 weeks    PT Treatment/Interventions Neuromuscular re-education;Therapeutic exercise;Therapeutic activities;Balance training;Functional mobility training;Gait training    PT Next Visit Plan getting up/down floor , balance, walking progression    PT Home Exercise Plan needs a revision, renovation, redux    Consulted and Agree with Plan of Care Patient;Family member/caregiver    Family Member Consulted DTR           Patient will benefit from skilled therapeutic intervention in order to improve the following deficits and impairments:  Abnormal gait,Decreased activity tolerance,Decreased endurance,Decreased knowledge of use of DME,Impaired flexibility,Difficulty walking,Decreased balance,Pain  Visit Diagnosis: Muscle weakness (generalized)  Difficulty in walking, not elsewhere classified     Problem List Patient Active Problem List   Diagnosis Date Noted  . Normocytic anemia 05/31/2020  . Hematochezia 04/11/2020  . Acute colitis 04/11/2020  . GI bleeding 04/11/2020  . Iron deficiency anemia 03/08/2020  . Goals of care, counseling/discussion 03/08/2020  . 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 Sinus bradycardia 04/09/2019  . First degree AV  block 04/09/2019  . Postural dizziness with presyncope 02/25/2019  . Osteoporosis 08/21/2018  . Osteoarthritis of left hip 05/21/2018  . Bilateral carotid artery stenosis 04/19/2018  . History of syncope 10/17/2017  . MCI (mild cognitive impairment) 08/12/2017  . Lung nodules 07/09/2017  . Paroxysmal atrial fibrillation ([place]) 04/18/2017  . Pleural effusion 10/20/2016  . Hyponatremia 10/20/2016  . Chronic diastolic congestive heart failure ([place]) 06/28/2016  . Primary open-angle glaucoma, bilateral, mild stage 05/08/2016  . Labile blood glucose 04/27/2016  . Bladder prolapse, female, acquired 02/25/2016  . 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  . B12 deficiency 01/11/2016  . Anemia 01/10/2016  . CLL (chronic lymphocytic leukemia) ([place]) 01/10/2016  . 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 12/21/2015  . Bradycardia 12/21/2015  . Lymphocytosis 06/25/2015  . Impaired fasting glucose 06/22/2015  . Carotid atherosclerosis 06/22/2015  . Essential hypertension 12/22/2014  . Hyperlipidemia 12/22/2014  . Hypothyroidism 12/22/2014   Janna Arch, PT, DPT   07/06/2020, 12:58 PM  [place] MAIN [facility] SERVICES [address] [place], [place], [address] Phone: [phone]   Fax:  [phone]  Name: Sydney Mcdaniel MRN: [id number] Date of Birth: 1925-03-09

## 2020-07-07 ENCOUNTER — Ambulatory Visit: Payer: Medicare Other

## 2020-07-07 ENCOUNTER — Other Ambulatory Visit: Payer: Self-pay

## 2020-07-07 DIAGNOSIS — M6281 Muscle weakness (generalized): Secondary | ICD-10-CM

## 2020-07-07 DIAGNOSIS — R262 Difficulty in walking, not elsewhere classified: Secondary | ICD-10-CM

## 2020-07-07 NOTE — Therapy (Signed)
[place] [facility] REHAB SERVICES [address] [place], [place], [address] Phone: [phone]   Fax:  [phone]  Physical Therapy Treatment Physical Therapy Progress Note   Dates of reporting period  05/19/20   to   07/07/20  Patient Details  Name: Sydney Mcdaniel MRN: [id number] Date of Birth: 04/25/1925 Referring Provider (PT): Tapia, Leisa   Encounter Date: 07/07/2020   PT End of Session - 07/07/20 1731    Visit Number 30    Number of Visits 44    Date for PT Re-Evaluation 08/11/20    Authorization Type next session 1/10 PN 07/07/20    PT Start Time 1545    PT Stop Time 1631    PT Time Calculation (min) 46 min    Equipment Utilized During Treatment Gait belt    Activity Tolerance Patient tolerated treatment well    Behavior During Therapy WFL for tasks assessed/performed           Past Medical History:  Diagnosis Date  . Allergy   . Anemia   . Cataract   . CLL (chronic lymphocytic leukemia) ([facility])   . Glaucoma   . H/O: hysterectomy    Total  . Hyperlipidemia   . Hypertension   . Hypothyroidism   . Impaired fasting glucose   . Lichen sclerosus   . Osteoporosis    Hips  . PAF (paroxysmal atrial fibrillation) ([facility])    a. 06/15/2016 Event monitor: 4% afib burden; b. CHA2DS2VASc - 4-->Xarelto.  . Syncope    a. 03/2016 Echo: EF55-60%, no rwma, mild AI/MR, nl PASP; b. 03/2016 48h Holter: no significant arrhythmias/pauses; c. 03/2016 MV: mild apical defect, likely breast attenuation, nl EF, low risk; d. 05/2016 Event monitor: No significant arrhythmia; e. 05/2016 Event monitor: PAF (4%).    Past Surgical History:  Procedure Laterality Date  . APPENDECTOMY    . EYE SURGERY     Glaucoma  . TOTAL ABDOMINAL HYSTERECTOMY      There were no vitals filed for this visit.   Subjective Assessment - 07/07/20 1728    Subjective Patient presents with her daughter and her fixed walking sticks.    Pertinent History Patient had HHPT 10  months ago. Before that she was in the hospital with BP issues over night. She was walking with spc outdoors, inside she holds to the walls.    Limitations Walking;Standing    How long can you stand comfortably? 15 minutes    How long can you walk comfortably? 5 mins    Patient Stated Goals to have better balance and walk better    Currently in Pain? No/denies            Goals:  FOTO: 49%  TUG: 21.45 with walking sticks  10 MWT: 17 seconds with walking sticks  BERG   Treatment:   weavng between 6 cones with walking sticks, cues for bringing L stick forward more; x2 sets Stepping over two consecutive half foam rollers with walking sticks, cues for sequencing and safety x 6 trials with one near LOB requiring assistance to re-stabilize.   Standing next to support surface: 2.5 ankle weight:  -standing marches high knees, BUE support 12x each LE -standing hip extension 10x, each LE with BUE support -standing hip abduction 10x each LE, cues for foot alignment, BUE support  Seated: 2.5 ankle weight: -soccer ball kicks for coordination, sequencing, timing of muscle recruitment x 3 minutes; second trial with additional challenge to make 10 goals   each LE  -alternating ER/IR 10x each LE -bilateral LAQ 10x   Education on adjustment and use of walking sticks for safe mobility to patient and patient's daughter.          Patient's condition has the potential to improve in response to therapy. Maximum improvement is yet to be obtained. The anticipated improvement is attainable and reasonable in a generally predictable time.  Patient reports she is more stable feeling but did lose some confidence over her recent illness.               PT Education - 07/07/20 1729    Education Details goals, walking sticks    Person(s) Educated Patient    Methods Explanation;Demonstration;Tactile cues;Verbal cues    Comprehension Verbalized understanding;Returned demonstration;Verbal cues  required;Tactile cues required            PT Short Term Goals - 06/16/20 1224      PT SHORT TERM GOAL #1   Title Patient (> 69 years old) will complete five times sit to stand test in < 15 seconds indicating an increased LE strength and improved balance.    Baseline 12/22: 16seconds    Time 4    Period Weeks    Status Partially Met    Target Date 07/14/20             PT Long Term Goals - 07/07/20 1737      PT LONG TERM GOAL #1   Title Patient will increase BLE gross strength to 4+/5 as to improve functional strength for independent gait, increased standing tolerance and increased ADL ability.    Baseline 12/22: grossly 4/5 LLE with RLE 4+/5    Time 8    Period Weeks    Status Partially Met    Target Date 08/11/20      PT LONG TERM GOAL #2   Title Patient will increase Berg Balance score toi >45  to decrease falls risk.    Baseline _0  28; 10/7: 39., 04/21/20=32/56 12/22: 48/56    Time 8    Period Weeks    Status Achieved      PT LONG TERM GOAL #3   Title Patient will reduce timed up and go to <11 seconds to reduce fall risk and demonstrate improved transfer/gait ability.    Baseline At eval 27sec, 10/7: 27sec (some confusion about sitting at end) eschews SPC use, 04/21/20=27.28 sec, 05/19/20=16.13sec 12/22: 15.8 seconds w/o AD 1/12: 21.45 with walking sticks due to confusion    Time 8    Period Weeks    Status On-going    Target Date 08/11/20      PT LONG TERM GOAL #4   Title Patient will increase 10 meter walk test to >1.71ms as to improve gait speed for better community ambulation and to reduce fall risk.    Baseline 48sec at eval; 10/7: 19sec self selected, 16sec fastest, 04/21/20/= .47 m/ sec, 05/19/20=.[id number] 12/22: 0.67 m/s w AD 19 seconds w/o AD 1/12: 0.56 m/s with walking sticks    Time 8    Period Weeks    Status Partially Met    Target Date 08/11/20      PT LONG TERM GOAL #5   Title Patient will increase FOTO score to equal to or greater than 58 to  demonstrate statistically significant improvement in mobility and quality of life.    Baseline 12/22: 54%    Time 8    Period Weeks    Status New  PT LONG TERM GOAL #6   Title Patient will increase Berg Balance by > 4 points (52/56) to demonstrate decreased fall risk during functional activities.    Baseline 12/22: 48/56    Time 8    Period Weeks    Status New    Target Date 08/11/20                 Plan - 07/07/20 1737    Clinical Impression Statement Patient's goals assessed prior to illness on 06/16/20 however a few were re-tested due to patients recent absence from therapy due to COVID. Patient was challenged initially with use of walking canes however demonstrated improved understanding by end of session. Focus on placement of L walking stick will continue to be an area of focus. Patient's condition has the potential to improve in response to therapy. Maximum improvement is yet to be obtained. The anticipated improvement is attainable and reasonable in a generally predictable time.  The patient continues to benefit from additional skilled PT intervention to further improve balance and overall strength for improved confidence and ability to ambulate household and community distances    Personal Factors and Comorbidities Age;Comorbidity 1;Comorbidity 2    Comorbidities HTN, osteoporosis, visual impairment, back pain, incontenence, Cancer    Examination-Activity Limitations Bathing    Stability/Clinical Decision Making Stable/Uncomplicated    Rehab Potential Good    PT Frequency 2x / week    PT Duration 8 weeks    PT Treatment/Interventions Neuromuscular re-education;Therapeutic exercise;Therapeutic activities;Balance training;Functional mobility training;Gait training    PT Next Visit Plan getting up/down floor , balance, walking progression    PT Home Exercise Plan needs a revision, renovation, redux    Consulted and Agree with Plan of Care Patient;Family member/caregiver     Family Member Consulted DTR           Patient will benefit from skilled therapeutic intervention in order to improve the following deficits and impairments:  Abnormal gait,Decreased activity tolerance,Decreased endurance,Decreased knowledge of use of DME,Impaired flexibility,Difficulty walking,Decreased balance,Pain  Visit Diagnosis: Difficulty in walking, not elsewhere classified  Muscle weakness (generalized)     Problem List Patient Active Problem List   Diagnosis Date Noted  . Normocytic anemia 05/31/2020  . Hematochezia 04/11/2020  . Acute colitis 04/11/2020  . GI bleeding 04/11/2020  . Iron deficiency anemia 03/08/2020  . Goals of care, counseling/discussion 03/08/2020  . 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 Sinus bradycardia 04/09/2019  . First degree AV block 04/09/2019  . Postural dizziness with presyncope 02/25/2019  . Osteoporosis 08/21/2018  . Osteoarthritis of left hip 05/21/2018  . Bilateral carotid artery stenosis 04/19/2018  . History of syncope 10/17/2017  . MCI (mild cognitive impairment) 08/12/2017  . Lung nodules 07/09/2017  . Paroxysmal atrial fibrillation ([facility]) 04/18/2017  . Pleural effusion 10/20/2016  . Hyponatremia 10/20/2016  . Chronic diastolic congestive heart failure ([facility]) 06/28/2016  . Primary open-angle glaucoma, bilateral, mild stage 05/08/2016  . Labile blood glucose 04/27/2016  . Bladder prolapse, female, acquired 02/25/2016  . 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  . B12 deficiency 01/11/2016  . Anemia 01/10/2016  . CLL (chronic lymphocytic leukemia) ([facility]) 01/10/2016  . 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 12/21/2015  . Bradycardia 12/21/2015  . Lymphocytosis 06/25/2015  . Impaired fasting glucose 06/22/2015  . Carotid atherosclerosis 06/22/2015  . Essential hypertension 12/22/2014  . Hyperlipidemia 12/22/2014  . Hypothyroidism 12/22/2014     , PT,   DPT   07/07/2020, 5:40 PM  [place] MAIN [facility] SERVICES [address] [place], [place], [address] Phone: [phone]   Fax:  [phone]  Name: Sydney Mcdaniel MRN: [id number] Date of Birth: [birth year]

## 2020-07-09 ENCOUNTER — Ambulatory Visit: Payer: Medicare Other

## 2020-07-12 ENCOUNTER — Ambulatory Visit: Payer: Medicare Other

## 2020-07-13 ENCOUNTER — Ambulatory Visit: Payer: Medicare Other

## 2020-07-14 ENCOUNTER — Ambulatory Visit: Payer: Medicare Other

## 2020-07-14 ENCOUNTER — Other Ambulatory Visit: Payer: Self-pay

## 2020-07-14 DIAGNOSIS — M6281 Muscle weakness (generalized): Secondary | ICD-10-CM | POA: Diagnosis not present

## 2020-07-14 DIAGNOSIS — R262 Difficulty in walking, not elsewhere classified: Secondary | ICD-10-CM

## 2020-07-14 NOTE — Therapy (Signed)
[place] MAIN [facility] SERVICES [address] [place], [place], [address] Phone: [phone]   Fax:  [phone]  Physical Therapy Treatment  Patient Details  Name: Sydney Mcdaniel MRN: [id number] Date of Birth: 12/06/1924 Referring Provider (PT): Delsa Grana   Encounter Date: 07/14/2020   PT End of Session - 07/14/20 0854    Visit Number 31    Number of Visits 44    Date for PT Re-Evaluation 08/11/20    Authorization Type 1/10 PN 07/07/20    PT Start Time 0845    PT Stop Time 0930    PT Time Calculation (min) 45 min    Equipment Utilized During Treatment Gait belt    Activity Tolerance Patient tolerated treatment well    Behavior During Therapy [facility] for tasks assessed/performed           Past Medical History:  Diagnosis Date  . Allergy   . Anemia   . Cataract   . CLL (chronic lymphocytic leukemia) ([place])   . Glaucoma   . H/O: hysterectomy    Total  . Hyperlipidemia   . Hypertension   . Hypothyroidism   . Impaired fasting glucose   . Lichen sclerosus   . Osteoporosis    Hips  . PAF (paroxysmal atrial fibrillation) ([place])    a. 06/15/2016 Event monitor: 4% afib burden; b. CHA2DS2VASc - 4-->Xarelto.  . Syncope    a. 03/2016 Echo: EF55-60%, no rwma, mild AI/MR, nl PASP; b. 03/2016 48h Holter: no significant arrhythmias/pauses; c. 03/2016 MV: mild apical defect, likely breast attenuation, nl EF, low risk; d. 05/2016 Event monitor: No significant arrhythmia; e. 05/2016 Event monitor: PAF (4%).    Past Surgical History:  Procedure Laterality Date  . APPENDECTOMY    . EYE SURGERY     Glaucoma  . TOTAL ABDOMINAL HYSTERECTOMY      There were no vitals filed for this visit.   Subjective Assessment - 07/14/20 0853    Subjective Patient has been staying with daughter due to the bad weather but has been compliant with HEP. No falls or LOB. Had one episode of cramping.    Pertinent History Patient had HHPT 10 months ago. Before that  she was in the hospital with BP issues over night. She was walking with spc outdoors, inside she holds to the walls.    Limitations Walking;Standing    How long can you stand comfortably? 15 minutes    How long can you walk comfortably? 5 mins    Patient Stated Goals to have better balance and walk better    Currently in Pain? No/denies                 Ther-ex  Nu-step L3 x 4 minutes for cardiovascular challenge RTB around ankles: side step 4x length of  // bars   RTB monster walks 4x length of // bars  Walk with weighted ball (2000 gr) 4x length of // bars for carryover to home environment   Seated: Sit to stand with overhead ball raise 10x   Neuromuscular Re-education by support bar:  bosu ball: forward modified lunge with decreasing UE support 15x each LE airex balance beam lateral stepping 4x length of // bars no UE support bosu ball : lateral modified lunge with decreasing UE support airex balance beam: grab ball and toss at target x 12 balls for pertubation's on unstable surfaces x2 trials         Patient performed with instruction, verbal  cues, tactile cues of therapist: goal: increase tissue extensibility, promote proper posture, improve mobility   vitals monitored throughout session.                      PT Education - 07/14/20 0854    Education Details exercise technique, body mechanics    Person(s) Educated Patient    Methods Explanation;Demonstration;Tactile cues;Verbal cues    Comprehension Verbalized understanding;Returned demonstration;Verbal cues required;Tactile cues required            PT Short Term Goals - 06/16/20 1224      PT SHORT TERM GOAL #1   Title Patient (> 85 years old) will complete five times sit to stand test in < 15 seconds indicating an increased LE strength and improved balance.    Baseline 12/22: 16seconds    Time 4    Period Weeks    Status Partially Met    Target Date 07/14/20             PT Long Term  Goals - 07/07/20 1737      PT LONG TERM GOAL #1   Title Patient will increase BLE gross strength to 4+/5 as to improve functional strength for independent gait, increased standing tolerance and increased ADL ability.    Baseline 12/22: grossly 4/5 LLE with RLE 4+/5    Time 8    Period Weeks    Status Partially Met    Target Date 08/11/20      PT LONG TERM GOAL #2   Title Patient will increase Berg Balance score toi >45  to decrease falls risk.    Baseline '@eval'  28; 10/7: 39., 04/21/20=32/56 12/22: 48/56    Time 8    Period Weeks    Status Achieved      PT LONG TERM GOAL #3   Title Patient will reduce timed up and go to <11 seconds to reduce fall risk and demonstrate improved transfer/gait ability.    Baseline At eval 27sec, 10/7: 27sec (some confusion about sitting at end) eschews SPC use, 04/21/20=27.28 sec, 05/19/20=16.13sec 12/22: 15.8 seconds w/o AD 1/12: 21.45 with walking sticks due to confusion    Time 8    Period Weeks    Status On-going    Target Date 08/11/20      PT LONG TERM GOAL #4   Title Patient will increase 10 meter walk test to >1.11ms as to improve gait speed for better community ambulation and to reduce fall risk.    Baseline 48sec at eval; 10/7: 19sec self selected, 16sec fastest, 04/21/20/= .47 m/ sec, 05/19/20=.744mec 12/22: 0.67 m/s w AD 19 seconds w/o AD 1/12: 0.56 m/s with walking sticks    Time 8    Period Weeks    Status Partially Met    Target Date 08/11/20      PT LONG TERM GOAL #5   Title Patient will increase FOTO score to equal to or greater than 58 to demonstrate statistically significant improvement in mobility and quality of life.    Baseline 12/22: 54%    Time 8    Period Weeks    Status New      PT LONG TERM GOAL #6   Title Patient will increase Berg Balance by > 4 points (52/56) to demonstrate decreased fall risk during functional activities.    Baseline 12/22: 48/56    Time 8    Period Weeks    Status New    Target Date 08/11/20  Plan - 07/14/20 1225    Clinical Impression Statement Patient is demonstrating excellent ankle righting reactions on stable and unstable surfaces with decreasing need for UE support. She continues to shuffle feet when fatigued but improves with external and internal cueing. The patient continues to benefit from additional skilled PT intervention to further improve balance and overall strength for improved confidence and ability to ambulate household and community distances    Personal Factors and Comorbidities Age;Comorbidity 1;Comorbidity 2    Comorbidities HTN, osteoporosis, visual impairment, back pain, incontenence, Cancer    Examination-Activity Limitations Bathing    Stability/Clinical Decision Making Stable/Uncomplicated    Rehab Potential Good    PT Frequency 2x / week    PT Duration 8 weeks    PT Treatment/Interventions Neuromuscular re-education;Therapeutic exercise;Therapeutic activities;Balance training;Functional mobility training;Gait training    PT Next Visit Plan getting up/down floor , balance, walking progression    PT Home Exercise Plan needs a revision, renovation, redux    Consulted and Agree with Plan of Care Patient;Family member/caregiver    Family Member Consulted DTR           Patient will benefit from skilled therapeutic intervention in order to improve the following deficits and impairments:  Abnormal gait,Decreased activity tolerance,Decreased endurance,Decreased knowledge of use of DME,Impaired flexibility,Difficulty walking,Decreased balance,Pain  Visit Diagnosis: Difficulty in walking, not elsewhere classified  Muscle weakness (generalized)     Problem List Patient Active Problem List   Diagnosis Date Noted  . Normocytic anemia 05/31/2020  . Hematochezia 04/11/2020  . Acute colitis 04/11/2020  . GI bleeding 04/11/2020  . Iron deficiency anemia 03/08/2020  . Goals of care, counseling/discussion 03/08/2020  . Positive  direct antiglobulin test (DAT) 12/04/2019  . Sinus bradycardia 04/09/2019  . First degree AV block 04/09/2019  . Postural dizziness with presyncope 02/25/2019  . Osteoporosis 08/21/2018  . Osteoarthritis of left hip 05/21/2018  . Bilateral carotid artery stenosis 04/19/2018  . History of syncope 10/17/2017  . MCI (mild cognitive impairment) 08/12/2017  . Lung nodules 07/09/2017  . Paroxysmal atrial fibrillation ([place]) 04/18/2017  . Pleural effusion 10/20/2016  . Hyponatremia 10/20/2016  . Chronic diastolic congestive heart failure ([place]) 06/28/2016  . Primary open-angle glaucoma, bilateral, mild stage 05/08/2016  . Labile blood glucose 04/27/2016  . Bladder prolapse, female, acquired 02/25/2016  . 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  . B12 deficiency 01/11/2016  . Anemia 01/10/2016  . CLL (chronic lymphocytic leukemia) ([place]) 01/10/2016  . 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 12/21/2015  . Bradycardia 12/21/2015  . Lymphocytosis 06/25/2015  . Impaired fasting glucose 06/22/2015  . Carotid atherosclerosis 06/22/2015  . Essential hypertension 12/22/2014  . Hyperlipidemia 12/22/2014  . Hypothyroidism 12/22/2014   Janna Arch, PT, DPT   07/14/2020, 12:27 PM  [place] MAIN [facility] SERVICES [address] [place], [place], [address] Phone: [phone]   Fax:  [phone]  Name: KERRY-ANNE MEZO MRN: [id number] Date of Birth: June 25, 1925

## 2020-07-15 ENCOUNTER — Telehealth: Payer: Self-pay | Admitting: Family Medicine

## 2020-07-15 ENCOUNTER — Ambulatory Visit: Payer: Medicare Other

## 2020-07-15 NOTE — Telephone Encounter (Signed)
Copied from [place] [id number]. Topic: Medicare AWV >> Jul 15, 2020 10:07 AM Cher Nakai R wrote: Reason for CRM:   Left message reference to needing to reschedule AWVS  on Jul 20, 2020 to another day due to [facility] schedule change-srs  If unable to come into the office please offer Virtual or telephone.

## 2020-07-19 DIAGNOSIS — H401132 Primary open-angle glaucoma, bilateral, moderate stage: Secondary | ICD-10-CM | POA: Diagnosis not present

## 2020-07-19 DIAGNOSIS — E119 Type 2 diabetes mellitus without complications: Secondary | ICD-10-CM | POA: Diagnosis not present

## 2020-07-20 ENCOUNTER — Ambulatory Visit (INDEPENDENT_AMBULATORY_CARE_PROVIDER_SITE_OTHER): Payer: Medicare Other

## 2020-07-20 ENCOUNTER — Ambulatory Visit: Payer: Medicare Other

## 2020-07-20 ENCOUNTER — Other Ambulatory Visit: Payer: Self-pay

## 2020-07-20 VITALS — BP 144/64 | HR 67 | Temp 97.7°F | Resp 15 | Ht 62.0 in | Wt 145.4 lb

## 2020-07-20 DIAGNOSIS — Z0000 Encounter for general adult medical examination without abnormal findings: Secondary | ICD-10-CM

## 2020-07-20 DIAGNOSIS — M6281 Muscle weakness (generalized): Secondary | ICD-10-CM | POA: Diagnosis not present

## 2020-07-20 DIAGNOSIS — R262 Difficulty in walking, not elsewhere classified: Secondary | ICD-10-CM

## 2020-07-20 NOTE — Therapy (Signed)
[place] MAIN [facility] SERVICES [address] [place], [place], [address] Phone: [phone]   Fax:  [phone]  Physical Therapy Treatment  Patient Details  Name: Sydney Mcdaniel MRN: [id number] Date of Birth: Dec 31, 1924 Referring Provider (PT): Delsa Grana   Encounter Date: 07/20/2020   PT End of Session - 07/20/20 1640    Visit Number 32    Number of Visits 44    Date for PT Re-Evaluation 08/11/20    Authorization Type 2/10 PN 07/07/20    PT Start Time 1300    PT Stop Time 1344    PT Time Calculation (min) 44 min    Equipment Utilized During Treatment Gait belt    Activity Tolerance Patient tolerated treatment well    Behavior During Therapy WFL for tasks assessed/performed           Past Medical History:  Diagnosis Date  . Allergy   . Anemia   . Cataract   . CLL (chronic lymphocytic leukemia) ([place])   . GERD (gastroesophageal reflux disease)   . Glaucoma   . H/O: hysterectomy    Total  . Hyperlipidemia   . Hypertension   . Hypothyroidism   . Impaired fasting glucose   . Lichen sclerosus   . Osteoporosis    Hips  . PAF (paroxysmal atrial fibrillation) ([place])    a. 06/15/2016 Event monitor: 4% afib burden; b. CHA2DS2VASc - 4-->Xarelto.  . Syncope    a. 03/2016 Echo: EF55-60%, no rwma, mild AI/MR, nl PASP; b. 03/2016 48h Holter: no significant arrhythmias/pauses; c. 03/2016 MV: mild apical defect, likely breast attenuation, nl EF, low risk; d. 05/2016 Event monitor: No significant arrhythmia; e. 05/2016 Event monitor: PAF (4%).    Past Surgical History:  Procedure Laterality Date  . APPENDECTOMY    . EYE SURGERY     Glaucoma  . TOTAL ABDOMINAL HYSTERECTOMY      There were no vitals filed for this visit.   Subjective Assessment - 07/20/20 1626    Subjective Patient stayed with her daughter over the snow. Reports she has improved her ability to get up her daugthers stairs. Is having a hard time with her stick  coordination with walking sticks.    Pertinent History Patient had HHPT 10 months ago. Before that she was in the hospital with BP issues over night. She was walking with spc outdoors, inside she holds to the walls.    Limitations Walking;Standing    How long can you stand comfortably? 15 minutes    How long can you walk comfortably? 5 mins    Patient Stated Goals to have better balance and walk better    Currently in Pain? No/denies                   Ther-ex   2.5 ankle weight: -standing marches 12x each LE, BUE support -standing hip abduction 15x each LE, BUE support -standing hip extension 15x each LE, BUE support -seated LAQ 10x each LE -seated kicking soccer ball for coordination, spatial awareness, sequencing of muscle activation x 2 minutes, second set with additional challenge of making ball between nets -seated alternating ER/IR 15x each LE       Neuromuscular Re-education by support bar:  bosu ball: forward modified lunge with decreasing UE support 15x each LE airex pad: horizontal head turns 20x, vertical head turns 15x bosu ball : lateral modified lunge with decreasing UE support 15x each LE, Single LE step    step  with contralateral reach 10x each LE< SUE support       Patient performed with instruction, verbal cues, tactile cues of therapist: goal: increase tissue extensibility, promote proper posture, improve mobility   vitals monitored throughout session.     Patient is improving with static and dynamic stability on stable and unstable surfaces. She is tolerated progressive strengthening techniques with occasional need for seated rest breaks. The patient continues to benefit from additional skilled PT intervention to further improve balance and overall strength for improved confidence and ability to ambulate household and community distances                    PT Education - 07/20/20 1639    Education Details exercise technique, body  mechanics    Person(s) Educated Patient    Methods Explanation;Demonstration;Tactile cues;Verbal cues    Comprehension Verbalized understanding;Returned demonstration;Verbal cues required;Tactile cues required            PT Short Term Goals - 06/16/20 1224      PT SHORT TERM GOAL #1   Title Patient (> 85 years old) will complete five times sit to stand test in < 15 seconds indicating an increased LE strength and improved balance.    Baseline 12/22: 16seconds    Time 4    Period Weeks    Status Partially Met    Target Date 07/14/20             PT Long Term Goals - 07/07/20 1737      PT LONG TERM GOAL #1   Title Patient will increase BLE gross strength to 4+/5 as to improve functional strength for independent gait, increased standing tolerance and increased ADL ability.    Baseline 12/22: grossly 4/5 LLE with RLE 4+/5    Time 8    Period Weeks    Status Partially Met    Target Date 08/11/20      PT LONG TERM GOAL #2   Title Patient will increase Berg Balance score toi >45  to decrease falls risk.    Baseline _0  28; 10/7: 39., 04/21/20=32/56 12/22: 48/56    Time 8    Period Weeks    Status Achieved      PT LONG TERM GOAL #3   Title Patient will reduce timed up and go to <11 seconds to reduce fall risk and demonstrate improved transfer/gait ability.    Baseline At eval 27sec, 10/7: 27sec (some confusion about sitting at end) eschews SPC use, 04/21/20=27.28 sec, 05/19/20=16.13sec 12/22: 15.8 seconds w/o AD 1/12: 21.45 with walking sticks due to confusion    Time 8    Period Weeks    Status On-going    Target Date 08/11/20      PT LONG TERM GOAL #4   Title Patient will increase 10 meter walk test to >1.29ms as to improve gait speed for better community ambulation and to reduce fall risk.    Baseline 48sec at eval; 10/7: 19sec self selected, 16sec fastest, 04/21/20/= .47 m/ sec, 05/19/20=.771mec 12/22: 0.67 m/s w AD 19 seconds w/o AD 1/12: 0.56 m/s with walking sticks     Time 8    Period Weeks    Status Partially Met    Target Date 08/11/20      PT LONG TERM GOAL #5   Title Patient will increase FOTO score to equal to or greater than 58 to demonstrate statistically significant improvement in mobility and quality of life.    Baseline 12/22: 54%  Time 8    Period Weeks    Status New      PT LONG TERM GOAL #6   Title Patient will increase Berg Balance by > 4 points (52/56) to demonstrate decreased fall risk during functional activities.    Baseline 12/22: 48/56    Time 8    Period Weeks    Status New    Target Date 08/11/20                 Plan - 07/20/20 1642    Clinical Impression Statement Patient is improving with static and dynamic stability on stable and unstable surfaces. She is tolerated progressive strengthening techniques with occasional need for seated rest breaks. The patient continues to benefit from additional skilled PT intervention to further improve balance and overall strength for improved confidence and ability to ambulate household and community distances    Personal Factors and Comorbidities Age;Comorbidity 1;Comorbidity 2    Comorbidities HTN, osteoporosis, visual impairment, back pain, incontenence, Cancer    Examination-Activity Limitations Bathing    Stability/Clinical Decision Making Stable/Uncomplicated    Rehab Potential Good    PT Frequency 2x / week    PT Duration 8 weeks    PT Treatment/Interventions Neuromuscular re-education;Therapeutic exercise;Therapeutic activities;Balance training;Functional mobility training;Gait training    PT Next Visit Plan getting up/down floor , balance, walking progression    PT Home Exercise Plan needs a revision, renovation, redux    Consulted and Agree with Plan of Care Patient;Family member/caregiver    Family Member Consulted DTR           Patient will benefit from skilled therapeutic intervention in order to improve the following deficits and impairments:  Abnormal  gait,Decreased activity tolerance,Decreased endurance,Decreased knowledge of use of DME,Impaired flexibility,Difficulty walking,Decreased balance,Pain  Visit Diagnosis: Difficulty in walking, not elsewhere classified  Muscle weakness (generalized)     Problem List Patient Active Problem List   Diagnosis Date Noted  . Normocytic anemia 05/31/2020  . Hematochezia 04/11/2020  . Acute colitis 04/11/2020  . GI bleeding 04/11/2020  . Iron deficiency anemia 03/08/2020  . Goals of care, counseling/discussion 03/08/2020  . 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 Sinus bradycardia 04/09/2019  . First degree AV block 04/09/2019  . Postural dizziness with presyncope 02/25/2019  . Osteoporosis 08/21/2018  . Osteoarthritis of left hip 05/21/2018  . Bilateral carotid artery stenosis 04/19/2018  . History of syncope 10/17/2017  . MCI (mild cognitive impairment) 08/12/2017  . Lung nodules 07/09/2017  . Paroxysmal atrial fibrillation ([place]) 04/18/2017  . Pleural effusion 10/20/2016  . Hyponatremia 10/20/2016  . Chronic diastolic congestive heart failure ([place]) 06/28/2016  . Primary open-angle glaucoma, bilateral, mild stage 05/08/2016  . Labile blood glucose 04/27/2016  . Bladder prolapse, female, acquired 02/25/2016  . 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  . B12 deficiency 01/11/2016  . Anemia 01/10/2016  . CLL (chronic lymphocytic leukemia) ([place]) 01/10/2016  . 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 12/21/2015  . Bradycardia 12/21/2015  . Lymphocytosis 06/25/2015  . Impaired fasting glucose 06/22/2015  . Carotid atherosclerosis 06/22/2015  . Essential hypertension 12/22/2014  . Hyperlipidemia 12/22/2014  . Hypothyroidism 12/22/2014   Janna Arch, PT, DPT   07/20/2020, 4:43 PM  [place] MAIN [facility] SERVICES [address] [place], [place], [address] Phone: [phone]    Fax:  [phone]  Name: KARN DERK MRN: [id number] Date of Birth: 10-May-1925

## 2020-07-20 NOTE — Patient Instructions (Signed)
[name] , Thank you for taking time to come for your Medicare Wellness Visit. I appreciate your ongoing commitment to your health goals. Please review the following plan we discussed and let me know if I can assist you in the future.   Screening recommendations/referrals: Colonoscopy: no longer required Mammogram: no longer required  Bone Density: done 08/19/18. Please discuss repeat screening recommendations at next appt with Delsa Grana [facility] Recommended yearly ophthalmology/optometry visit for glaucoma screening and checkup Recommended yearly dental visit for hygiene and checkup  Vaccinations: Influenza vaccine: done 03/11/20 Pneumococcal vaccine: done 12/10/13 Tdap vaccine: done 2019 Shingles vaccine: Shingrix discussed. Please contact your pharmacy for coverage information.  Covid-19: done 07/25/19, 08/15/19 & 03/29/20  Conditions/risks identified: Recommend drinking 6-8 glasses of water per day   Next appointment: Follow up in one year for your annual wellness visit    Preventive Care 65 Years and Older, Female Preventive care refers to lifestyle choices and visits with your health care provider that can promote health and wellness. What does preventive care include?  A yearly physical exam. This is also called an annual well check.  Dental exams once or twice a year.  Routine eye exams. Ask your health care provider how often you should have your eyes checked.  Personal lifestyle choices, including:  Daily care of your teeth and gums.  Regular physical activity.  Eating a healthy diet.  Avoiding tobacco and drug use.  Limiting alcohol use.  Practicing safe sex.  Taking low-dose aspirin every day.  Taking vitamin and mineral supplements as recommended by your health care provider. What happens during an annual well check? The services and screenings done by your health care provider during your annual well check will depend on your age, overall health, lifestyle  risk factors, and family history of disease. Counseling  Your health care provider may ask you questions about your:  Alcohol use.  Tobacco use.  Drug use.  Emotional well-being.  Home and relationship well-being.  Sexual activity.  Eating habits.  History of falls.  Memory and ability to understand (cognition).  Work and work Statistician.  Reproductive health. Screening  You may have the following tests or measurements:  Height, weight, and BMI.  Blood pressure.  Lipid and cholesterol levels. These may be checked every 5 years, or more frequently if you are over 61 years old.  Skin check.  Lung cancer screening. You may have this screening every year starting at age 48 if you have a 30-pack-year history of smoking and currently smoke or have quit within the past 15 years.  Fecal occult blood test (FOBT) of the stool. You may have this test every year starting at age 35.  Flexible sigmoidoscopy or colonoscopy. You may have a sigmoidoscopy every 5 years or a colonoscopy every 10 years starting at age 32.  Hepatitis C blood test.  Hepatitis B blood test.  Sexually transmitted disease (STD) testing.  Diabetes screening. This is done by checking your blood sugar (glucose) after you have not eaten for a while (fasting). You may have this done every 1-3 years.  Bone density scan. This is done to screen for osteoporosis. You may have this done starting at age 59.  Mammogram. This may be done every 1-2 years. Talk to your health care provider about how often you should have regular mammograms. Talk with your health care provider about your test results, treatment options, and if necessary, the need for more tests. Vaccines  Your health care provider may  recommend certain vaccines, such as:  Influenza vaccine. This is recommended every year.  Tetanus, diphtheria, and acellular pertussis (Tdap, Td) vaccine. You may need a Td booster every 10 years.  Zoster vaccine.  You may need this after age 25.  Pneumococcal 13-valent conjugate (PCV13) vaccine. One dose is recommended after age 17.  Pneumococcal polysaccharide (PPSV23) vaccine. One dose is recommended after age 63. Talk to your health care provider about which screenings and vaccines you need and how often you need them. This information is not intended to replace advice given to you by your health care provider. Make sure you discuss any questions you have with your health care provider. Document Released: 07/09/2015 Document Revised: 03/01/2016 Document Reviewed: 04/13/2015 Elsevier Interactive Patient Education  2017 [place] Prevention in the Home Falls can cause injuries. They can happen to people of all ages. There are many things you can do to make your home safe and to help prevent falls. What can I do on the outside of my home?  Regularly fix the edges of walkways and driveways and fix any cracks.  Remove anything that might make you trip as you walk through a door, such as a raised step or threshold.  Trim any bushes or trees on the path to your home.  Use bright outdoor lighting.  Clear any walking paths of anything that might make someone trip, such as rocks or tools.  Regularly check to see if handrails are loose or broken. Make sure that both sides of any steps have handrails.  Any raised decks and porches should have guardrails on the edges.  Have any leaves, snow, or ice cleared regularly.  Use sand or salt on walking paths during winter.  Clean up any spills in your garage right away. This includes oil or grease spills. What can I do in the bathroom?  Use night lights.  Install grab bars by the toilet and in the tub and shower. Do not use towel bars as grab bars.  Use non-skid mats or decals in the tub or shower.  If you need to sit down in the shower, use a plastic, non-slip stool.  Keep the floor dry. Clean up any water that spills on the floor as soon  as it happens.  Remove soap buildup in the tub or shower regularly.  Attach bath mats securely with double-sided non-slip rug tape.  Do not have throw rugs and other things on the floor that can make you trip. What can I do in the bedroom?  Use night lights.  Make sure that you have a light by your bed that is easy to reach.  Do not use any sheets or blankets that are too big for your bed. They should not hang down onto the floor.  Have a firm chair that has side arms. You can use this for support while you get dressed.  Do not have throw rugs and other things on the floor that can make you trip. What can I do in the kitchen?  Clean up any spills right away.  Avoid walking on wet floors.  Keep items that you use a lot in easy-to-reach places.  If you need to reach something above you, use a strong step stool that has a grab bar.  Keep electrical cords out of the way.  Do not use floor polish or wax that makes floors slippery. If you must use wax, use non-skid floor wax.  Do not have throw rugs and  other things on the floor that can make you trip. What can I do with my stairs?  Do not leave any items on the stairs.  Make sure that there are handrails on both sides of the stairs and use them. Fix handrails that are broken or loose. Make sure that handrails are as long as the stairways.  Check any carpeting to make sure that it is firmly attached to the stairs. Fix any carpet that is loose or worn.  Avoid having throw rugs at the top or bottom of the stairs. If you do have throw rugs, attach them to the floor with carpet tape.  Make sure that you have a light switch at the top of the stairs and the bottom of the stairs. If you do not have them, ask someone to add them for you. What else can I do to help prevent falls?  Wear shoes that:  Do not have high heels.  Have rubber bottoms.  Are comfortable and fit you well.  Are closed at the toe. Do not wear sandals.  If  you use a stepladder:  Make sure that it is fully opened. Do not climb a closed stepladder.  Make sure that both sides of the stepladder are locked into place.  Ask someone to hold it for you, if possible.  Clearly mark and make sure that you can see:  Any grab bars or handrails.  First and last steps.  Where the edge of each step is.  Use tools that help you move around (mobility aids) if they are needed. These include:  Canes.  Walkers.  Scooters.  Crutches.  Turn on the lights when you go into a dark area. Replace any light bulbs as soon as they burn out.  Set up your furniture so you have a clear path. Avoid moving your furniture around.  If any of your floors are uneven, fix them.  If there are any pets around you, be aware of where they are.  Review your medicines with your doctor. Some medicines can make you feel dizzy. This can increase your chance of falling. Ask your doctor what other things that you can do to help prevent falls. This information is not intended to replace advice given to you by your health care provider. Make sure you discuss any questions you have with your health care provider. Document Released: 04/08/2009 Document Revised: 11/18/2015 Document Reviewed: 07/17/2014 Elsevier Interactive Patient Education  2017 [employer].

## 2020-07-20 NOTE — Progress Notes (Signed)
Subjective:   Sydney Mcdaniel is a 87 y.o. female who presents for Medicare Annual (Subsequent) preventive examination.  Review of Systems     Cardiac Risk Factors include: advanced age (>33mn, >>55women);dyslipidemia;hypertension     Objective:    Today's Vitals   07/20/20 1048  BP: (!) 144/64  Pulse: 67  Resp: 15  Temp: 97.7 F (36.5 C)  TempSrc: Oral  SpO2: 97%  Weight: 145 lb 6.4 oz (66 kg)  Height: _0  (1.575 m)   Body mass index is 26.59 kg/m.  Advanced Directives 07/20/2020 05/31/2020 04/12/2020 04/11/2020 02/19/2020 11/11/2019 11/11/2019  Does Patient Have a Medical Advance Directive? Yes Yes - No Yes Yes Yes  Type of AParamed[place]of [place]Living will [place] CityLiving will - - - [place] Rivers[place] will -  Does patient want to make changes to medical advance directive? - Yes (ED - Information included in AVS) - - - No - Patient declined -  Copy of [place] Chart? Yes - validated most recent copy scanned in chart (See row information) - - - - No - copy requested -  Would patient like information on creating a medical advance directive? - - No - Patient declined - - - -    Current Medications (verified) Outpatient Encounter Medications as of 07/20/2020  Medication Sig  . alendronate (FOSAMAX) 70 MG tablet TAKE ONE TABLET BY MOUTH EVERY 7 DAYS. TAKE WITH A FULL GLASS OF WATER ON AN EMPTY STOMACH. (Patient taking differently: Take 70 mg by mouth every Friday. TAKE ONE TABLET BY MOUTH EVERY 7 DAYS. TAKE WITH A FULL GLASS OF WATER ON AN EMPTY STOMACH.)  . amLODipine (NORVASC) 5 MG tablet Take 1 tablet (5 mg total) by mouth daily.  .Marland Kitchenatorvastatin (LIPITOR) 20 MG tablet Take 1 tablet (20 mg total) by mouth at bedtime.  . Calcium Carb-Cholecalciferol (CALCIUM 600 + D PO) Take 1 tablet by mouth daily.   . cholecalciferol (VITAMIN D) 1000 units tablet Take 1,000 Units by mouth daily.  .  Cyanocobalamin (B-12) 1000 MCG/ML KIT Inject 1,000 mcg as directed every 30 (thirty) days.  . famotidine (PEPCID) 20 MG tablet Take 20 mg by mouth daily.  . ferrous sulfate 325 (65 FE) MG tablet Take 325 mg by mouth 2 (two) times daily with a meal.  . latanoprost (XALATAN) 0.005 % ophthalmic solution Place 1 drop into the right eye at bedtime.   .Marland Kitchenlevothyroxine (EUTHYROX) 75 MCG tablet TAKE ONE TABLET BY MOUTH ON MONDAYS, WEDNESDAYS, FRIDAYS, AND SUNDAYS (Patient taking differently: Take 75 mcg by mouth as directed. TAKE ONE TABLET BY MOUTH ON MONDAYS, WEDNESDAYS, FRIDAYS, AND SUNDAYS)  . levothyroxine (EUTHYROX) 88 MCG tablet TAKE 1 TABLET BY MOUTH ON TUESDAYS, THURSDAYS, AND SATURDAYS (Patient taking differently: Take 88 mcg by mouth as directed. TAKE 1 TABLET BY MOUTH ON TUESDAYS, THURSDAYS, AND SATURDAYS)  . lisinopril (ZESTRIL) 20 MG tablet Take 1 tablet (20 mg total) by mouth daily.  . Multiple Vitamins-Minerals (MULTIVITAMIN ADULT PO) Take by mouth.  . Omega-3 Fatty Acids (FISH OIL) 1200 MG CAPS Take 1,200 mg by mouth daily.   . Rivaroxaban (XARELTO) 15 MG TABS tablet Take 1 tablet (15 mg total) by mouth daily.  . timolol (TIMOPTIC) 0.5 % ophthalmic solution Place 1 drop into the right eye 2 (two) times daily.    No facility-administered encounter medications on file as of 07/20/2020.    Allergies (verified) Other, Alphagan [brimonidine], Pravachol [  pravastatin sodium], and Pravastatin   History: Past Medical History:  Diagnosis Date  . Allergy   . Anemia   . Cataract   . CLL (chronic lymphocytic leukemia) ([place])   . GERD (gastroesophageal reflux disease)   . Glaucoma   . H/O: hysterectomy    Total  . Hyperlipidemia   . Hypertension   . Hypothyroidism   . Impaired fasting glucose   . Lichen sclerosus   . Osteoporosis    Hips  . PAF (paroxysmal atrial fibrillation) ([place])    a. 06/15/2016 Event monitor: 4% afib burden; b. CHA2DS2VASc - 4-->Xarelto.  . Syncope    a. 03/2016  Echo: EF55-60%, no rwma, mild AI/MR, nl PASP; b. 03/2016 48h Holter: no significant arrhythmias/pauses; c. 03/2016 MV: mild apical defect, likely breast attenuation, nl EF, low risk; d. 05/2016 Event monitor: No significant arrhythmia; e. 05/2016 Event monitor: PAF (4%).   Past Surgical History:  Procedure Laterality Date  . APPENDECTOMY    . EYE SURGERY     Glaucoma  . TOTAL ABDOMINAL HYSTERECTOMY     Family History  Problem Relation Age of Onset  . Heart attack Mother   . Glaucoma Mother   . Heart attack Father   . Parkinson's disease Brother   . Heart attack Brother    Social History   Socioeconomic History  . Marital status: Widowed    Spouse name: Not on file  . Number of children: 2  . Years of education: Not on file  . Highest education level: High school graduate  Occupational History  . Occupation: retired  Tobacco Use  . Smoking status: Never Smoker  . Smokeless tobacco: Never Used  Vaping Use  . Vaping Use: Never used  Substance and Sexual Activity  . Alcohol use: No  . Drug use: No  . Sexual activity: Not Currently  Other Topics Concern  . Not on file  Social History Narrative   Pt lives alone.    Social Determinants of Health   Financial Resource Strain: Low Risk   . Difficulty of Paying Living Expenses: Not hard at all  Food Insecurity: No Food Insecurity  . Worried About Charity fundraiser in the Last Year: Never true  . Ran Out of Food in the Last Year: Never true  Transportation Needs: No Transportation Needs  . Lack of Transportation (Medical): No  . Lack of Transportation (Non-Medical): No  Physical Activity: Inactive  . Days of Exercise per Week: 0 days  . Minutes of Exercise per Session: 0 min  Stress: No Stress Concern Present  . Feeling of Stress : Not at all  Social Connections: Moderately Isolated  . Frequency of Communication with Friends and Family: More than three times a week  . Frequency of Social Gatherings with Friends and  Family: More than three times a week  . Attends Religious Services: More than 4 times per year  . Active Member of Clubs or Organizations: No  . Attends Archivist Meetings: Never  . Marital Status: Widowed    Tobacco Counseling Counseling given: Not Answered   Clinical Intake:  Pre-visit preparation completed: Yes  Pain : No/denies pain     BMI - recorded: 26.59 Nutritional Status: BMI 25 -29 Overweight Nutritional Risks: None Diabetes: No  How often do you need to have someone help you when you read instructions, pamphlets, or other written materials from your doctor or pharmacy?: 1 - Never    Interpreter Needed?: No  Information entered by ::  Clemetine Marker LPN   Activities of Daily Living In your present state of health, do you have any difficulty performing the following activities: 07/20/2020 04/12/2020  Hearing? N N  Comment declines hearing aids -  Vision? N N  Difficulty concentrating or making decisions? N N  Walking or climbing stairs? N N  Dressing or bathing? N N  Doing errands, shopping? Y N  Comment pt does not drive Facilities manager and eating ? N -  Using the Toilet? N -  In the past six months, have you accidently leaked urine? Y -  Comment wears pads for protection -  Do you have problems with loss of bowel control? N -  Managing your Medications? N -  Managing your Finances? N -  Housekeeping or managing your Housekeeping? N -  Some recent data might be hidden    Patient Care Team: Delsa Grana, PA-C as PCP - General (Family Medicine) End, Harrell Gave, MD as PCP - Cardiology (Cardiology) Lequita Asal, MD as Referring Physician (Hematology and Oncology) Anell Barr, OD (Optometry)  Indicate any recent Medical Services you may have received from other than [facility] providers in the past year (date may be approximate).     Assessment:   This is a routine wellness examination for [place].  Hearing/Vision screen  Hearing  Screening   _0  _1  _2  _3  _4  _5  _6  _7  _8   Right ear:           Left ear:           Comments: Pt denies hearing difficulty  Vision Screening Comments: Annual vision screenings with Dr. Ellin Mayhew in [place]  Dietary issues and exercise activities discussed: Current Exercise Habits: The patient does not participate in regular exercise at present, Exercise limited by: orthopedic condition(s)  Goals    . Prevent falls     Continue physical therapy to increase balance and strengthening exercises      Depression Screen PHQ 2/9 Scores 07/20/2020 04/08/2020 12/08/2019 11/04/2019 10/21/2019 07/08/2019 05/13/2019  PHQ - 2 Score 0 0 0 0 0 0 0  PHQ- 9 Score - - 0 0 0 - 0    Fall Risk Fall Risk  07/20/2020 04/08/2020 12/08/2019 11/04/2019 10/21/2019  Falls in the past year? 1 0 0 0 0  Number falls in past yr: 0 0 0 0 0  Comment - - - - -  Injury with Fall? 0 0 0 0 0  Comment - - - - -  Risk Factor Category  - - - - -  Risk for fall due to : History of fall(s);Impaired balance/gait Impaired balance/gait - - -  Follow up Falls prevention discussed Falls evaluation completed;Education provided;Falls prevention discussed - - -    FALL RISK PREVENTION PERTAINING TO THE HOME:  Any stairs in or around the home? Yes  If so, are there any without handrails? No  Home free of loose throw rugs in walkways, pet beds, electrical cords, etc? Yes  Adequate lighting in your home to reduce risk of falls? Yes   ASSISTIVE DEVICES UTILIZED TO PREVENT FALLS:  Life alert? Yes Use of a cane, walker or w/c? Yes  Grab bars in the bathroom? Yes  Shower chair or bench in shower? Yes  Elevated toilet seat or a handicapped toilet? No   TIMED UP AND GO:  Was the test performed? Yes .  Length of time to ambulate 10 feet: 8 sec.   Gait slow and steady with assistive device  Cognitive Function:     6CIT Screen 07/20/2020 07/08/2019 07/02/2018 06/27/2017  What Year? 0 points 0 points 0 points  0 points  What month? 0 points 0 points 0 points 0 points  What time? 0 points 0 points 0 points 0 points  Count back from 20 0 points 0 points 0 points 2 points  Months in reverse 0 points 0 points 0 points 0 points  Repeat phrase 4 points 4 points 2 points 6 points  Total Score _0 Immunizations Immunization History  Administered Date(s) Administered  . Influenza, High Dose Seasonal PF 03/22/2016, 03/24/2017, 04/01/2018, 03/18/2019, 03/11/2020  . Influenza,inj,Quad PF,6+ Mos 04/13/2015  . Influenza-Unspecified 04/11/2012, 03/10/2014, 03/24/2017  . PFIZER(Purple Top)SARS-COV-2 Vaccination 07/25/2019, 08/15/2019, 03/29/2020  . Pneumococcal Conjugate-13 12/10/2013  . Pneumococcal Polysaccharide-23 04/13/2008  . Td 01/22/2004  . Tdap 06/27/2017  . Zoster 04/10/2006    TDAP status: Up to date  Flu Vaccine status: Up to date  Pneumococcal vaccine status: Up to date  Covid-19 vaccine status: Completed vaccines  Qualifies for Shingles Vaccine? Yes   Zostavax completed Yes   Shingrix Completed?: No.    Education has been provided regarding the importance of this vaccine. Patient has been advised to call insurance company to determine out of pocket expense if they have not yet received this vaccine. Advised may also receive vaccine at local pharmacy or Health Dept. Verbalized acceptance and understanding.  Screening Tests Health Maintenance  Topic Date Due  . COVID-19 Vaccine (4 - Booster for Pfizer series) 09/27/2020  . TETANUS/TDAP  06/28/2027  . INFLUENZA VACCINE  Completed  . DEXA SCAN  Completed  . PNA vac Low Risk Adult  Completed    Health Maintenance  There are no preventive care reminders to display for this patient.  Colorectal cancer screening: No longer required.   Mammogram status: No longer required due to age.  Bone Density status: Completed 08/19/18. Results reflect: Bone density results: OSTEOPOROSIS. Repeat every 2 years.  Lung Cancer Screening:  (Low Dose CT Chest recommended if Age 89-80 years, 30 pack-year currently smoking OR have quit w/in 15years.) does not qualify.     Additional Screening:  Hepatitis C Screening: does not qualify  Vision Screening: Recommended annual ophthalmology exams for early detection of glaucoma and other disorders of the eye. Is the patient up to date with their annual eye exam?  Yes  Who is the provider or what is the name of the office in which the patient attends annual eye exams? Dr. Ellin Mayhew  Dental Screening: Recommended annual dental exams for proper oral hygiene  Community Resource Referral / Chronic Care Management: CRR required this visit?  No   CCM required this visit?  No      Plan:     I have personally reviewed and noted the following in the patient's chart:   . Medical and social history . Use of alcohol, tobacco or illicit drugs  . Current medications and supplements . Functional ability and status . Nutritional status . Physical activity . Advanced directives . List of other physicians . Hospitalizations, surgeries, and ER visits in previous 12 months . Vitals . Screenings to include cognitive, depression, and falls . Referrals and appointments  In addition, I have reviewed and discussed with patient certain preventive protocols, quality metrics, and best practice recommendations. A written personalized care plan for preventive services as well as general preventive health recommendations were provided to patient.     Clemetine Marker, LPN  07/20/2020   Nurse Notes: pt accompanied to visit by daughter [name]. Pt doing well and appreciative of visit today. Pt aware due for TSH and lipid panel but plans to be fasting at next OV with Leisa on 08/10/20 bc appt is earlier in the morning.

## 2020-07-22 ENCOUNTER — Other Ambulatory Visit: Payer: Self-pay

## 2020-07-22 ENCOUNTER — Ambulatory Visit: Payer: Medicare Other

## 2020-07-22 DIAGNOSIS — M6281 Muscle weakness (generalized): Secondary | ICD-10-CM

## 2020-07-22 DIAGNOSIS — R262 Difficulty in walking, not elsewhere classified: Secondary | ICD-10-CM

## 2020-07-22 NOTE — Therapy (Signed)
[place] MAIN [facility] SERVICES [address] [place], [place], [address] Phone: [phone]   Fax:  [phone]  Physical Therapy Treatment  Patient Details  Name: Sydney Mcdaniel MRN: [id number] Date of Birth: [birth year] Referring Provider (PT): Delsa Grana   Encounter Date: 07/22/2020   PT End of Session - 07/22/20 0852    Visit Number 33    Number of Visits 44    Date for PT Re-Evaluation 08/11/20    Authorization Type 3/10 PN 07/07/20    PT Start Time 0845    PT Stop Time 0928    PT Time Calculation (min) 43 min    Equipment Utilized During Treatment Gait belt    Activity Tolerance Patient tolerated treatment well    Behavior During Therapy [facility] for tasks assessed/performed           Past Medical History:  Diagnosis Date  . Allergy   . Anemia   . Cataract   . CLL (chronic lymphocytic leukemia) ([place])   . GERD (gastroesophageal reflux disease)   . Glaucoma   . H/O: hysterectomy    Total  . Hyperlipidemia   . Hypertension   . Hypothyroidism   . Impaired fasting glucose   . Lichen sclerosus   . Osteoporosis    Hips  . PAF (paroxysmal atrial fibrillation) ([place])    a. 06/15/2016 Event monitor: 4% afib burden; b. CHA2DS2VASc - 4-->Xarelto.  . Syncope    a. 03/2016 Echo: EF55-60%, no rwma, mild AI/MR, nl PASP; b. 03/2016 48h Holter: no significant arrhythmias/pauses; c. 03/2016 MV: mild apical defect, likely breast attenuation, nl EF, low risk; d. 05/2016 Event monitor: No significant arrhythmia; e. 05/2016 Event monitor: PAF (4%).    Past Surgical History:  Procedure Laterality Date  . APPENDECTOMY    . EYE SURGERY     Glaucoma  . TOTAL ABDOMINAL HYSTERECTOMY      There were no vitals filed for this visit.   Subjective Assessment - 07/22/20 0851    Subjective Patient reports no falls or LOB since last session. Has been compliant with HEP.    Pertinent History Patient had HHPT 10 months ago. Before that she was in the  hospital with BP issues over night. She was walking with spc outdoors, inside she holds to the walls.    Limitations Walking;Standing    How long can you stand comfortably? 15 minutes    How long can you walk comfortably? 5 mins    Patient Stated Goals to have better balance and walk better    Currently in Pain? No/denies                 Ther-ex  Nu-step L3 x 4 minutes for cardiovascular challenge RTB around ankles: side step 4x length of  // bars  Walk with weighted ball (2000 gr) 4x length of // bars for carryover to home environment   Seated: Sit to stand with overhead ball raise 10x    Neuromuscular Re-education by support bar:  bosu ball: forward modified lunge with no UE support 15x each LE bosu ball : lateral modified lunge with decreasing UE support airex balance beam lateral stepping 4x length of // bars no UE support  airex balance beam: grab ball and toss at target x 12 balls for pertubation's on unstable surfaces x2 trials    Speed ladder: one foot each step 8x length of // bars with decreasing UE support    Ambulate lap around gym without  AD, close CGA 86 ft, initially smaller shorter steps that improved with prolonged ambulation to a more equal gait pattern.    Patient performed with instruction, verbal cues, tactile cues of therapist: goal: increase tissue extensibility, promote proper posture, improve mobility   vitals monitored throughout session                     PT Education - 07/22/20 0851    Education Details exercise technique, body mechanics    Person(s) Educated Patient    Methods Explanation;Demonstration;Tactile cues;Verbal cues    Comprehension Verbalized understanding;Returned demonstration;Verbal cues required;Tactile cues required            PT Short Term Goals - 06/16/20 1224      PT SHORT TERM GOAL #1   Title Patient (> 18 years old) will complete five times sit to stand test in < 15 seconds indicating an  increased LE strength and improved balance.    Baseline 12/22: 16seconds    Time 4    Period Weeks    Status Partially Met    Target Date 07/14/20             PT Long Term Goals - 07/07/20 1737      PT LONG TERM GOAL #1   Title Patient will increase BLE gross strength to 4+/5 as to improve functional strength for independent gait, increased standing tolerance and increased ADL ability.    Baseline 12/22: grossly 4/5 LLE with RLE 4+/5    Time 8    Period Weeks    Status Partially Met    Target Date 08/11/20      PT LONG TERM GOAL #2   Title Patient will increase Berg Balance score toi >45  to decrease falls risk.    Baseline '@eval'  28; 10/7: 39., 04/21/20=32/56 12/22: 48/56    Time 8    Period Weeks    Status Achieved      PT LONG TERM GOAL #3   Title Patient will reduce timed up and go to <11 seconds to reduce fall risk and demonstrate improved transfer/gait ability.    Baseline At eval 27sec, 10/7: 27sec (some confusion about sitting at end) eschews SPC use, 04/21/20=27.28 sec, 05/19/20=16.13sec 12/22: 15.8 seconds w/o AD 1/12: 21.45 with walking sticks due to confusion    Time 8    Period Weeks    Status On-going    Target Date 08/11/20      PT LONG TERM GOAL #4   Title Patient will increase 10 meter walk test to >1.32ms as to improve gait speed for better community ambulation and to reduce fall risk.    Baseline 48sec at eval; 10/7: 19sec self selected, 16sec fastest, 04/21/20/= .47 m/ sec, 05/19/20=.[id number] 12/22: 0.67 m/s w AD 19 seconds w/o AD 1/12: 0.56 m/s with walking sticks    Time 8    Period Weeks    Status Partially Met    Target Date 08/11/20      PT LONG TERM GOAL #5   Title Patient will increase FOTO score to equal to or greater than 58 to demonstrate statistically significant improvement in mobility and quality of life.    Baseline 12/22: 54%    Time 8    Period Weeks    Status New      PT LONG TERM GOAL #6   Title Patient will increase Berg  Balance by > 4 points (52/56) to demonstrate decreased fall risk during functional activities.  Baseline 12/22: 48/56    Time 8    Period Weeks    Status New    Target Date 08/11/20                 Plan - 07/22/20 1615    Clinical Impression Statement Patient continues to demonstrate excellent motivation throughout physical therapy session. Step length and ability to clear feet is improving with carryover between sessions. Unstable surfaces continue to be an area for improvement however patient demonstrates progression each session. The patient continues to benefit from additional skilled PT intervention to further improve balance and overall strength for improved confidence and ability to ambulate household and community distances    Personal Factors and Comorbidities Age;Comorbidity 1;Comorbidity 2    Comorbidities HTN, osteoporosis, visual impairment, back pain, incontenence, Cancer    Examination-Activity Limitations Bathing    Stability/Clinical Decision Making Stable/Uncomplicated    Rehab Potential Good    PT Frequency 2x / week    PT Duration 8 weeks    PT Treatment/Interventions Neuromuscular re-education;Therapeutic exercise;Therapeutic activities;Balance training;Functional mobility training;Gait training    PT Next Visit Plan getting up/down floor , balance, walking progression    PT Home Exercise Plan needs a revision, renovation, redux    Consulted and Agree with Plan of Care Patient;Family member/caregiver    Family Member Consulted DTR           Patient will benefit from skilled therapeutic intervention in order to improve the following deficits and impairments:  Abnormal gait,Decreased activity tolerance,Decreased endurance,Decreased knowledge of use of DME,Impaired flexibility,Difficulty walking,Decreased balance,Pain  Visit Diagnosis: Difficulty in walking, not elsewhere classified  Muscle weakness (generalized)     Problem List Patient Active  Problem List   Diagnosis Date Noted  . Normocytic anemia 05/31/2020  . Hematochezia 04/11/2020  . Acute colitis 04/11/2020  . GI bleeding 04/11/2020  . Iron deficiency anemia 03/08/2020  . Goals of care, counseling/discussion 03/08/2020  . 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 Sinus bradycardia 04/09/2019  . First degree AV block 04/09/2019  . Postural dizziness with presyncope 02/25/2019  . Osteoporosis 08/21/2018  . Osteoarthritis of left hip 05/21/2018  . Bilateral carotid artery stenosis 04/19/2018  . History of syncope 10/17/2017  . MCI (mild cognitive impairment) 08/12/2017  . Lung nodules 07/09/2017  . Paroxysmal atrial fibrillation ([place]) 04/18/2017  . Pleural effusion 10/20/2016  . Hyponatremia 10/20/2016  . Chronic diastolic congestive heart failure ([place]) 06/28/2016  . Primary open-angle glaucoma, bilateral, mild stage 05/08/2016  . Labile blood glucose 04/27/2016  . Bladder prolapse, female, acquired 02/25/2016  . 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  . B12 deficiency 01/11/2016  . Anemia 01/10/2016  . CLL (chronic lymphocytic leukemia) ([place]) 01/10/2016  . 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 12/21/2015  . Bradycardia 12/21/2015  . Lymphocytosis 06/25/2015  . Impaired fasting glucose 06/22/2015  . Carotid atherosclerosis 06/22/2015  . Essential hypertension 12/22/2014  . Hyperlipidemia 12/22/2014  . Hypothyroidism 12/22/2014   Janna Arch, PT, DPT   07/22/2020, 4:16 PM  [place] MAIN [facility] SERVICES [address] [place], [place], [address] Phone: [phone]   Fax:  [phone]  Name: Sydney Mcdaniel MRN: [id number] Date of Birth: 02-May-1925

## 2020-07-26 ENCOUNTER — Ambulatory Visit: Payer: Medicare Other

## 2020-07-26 ENCOUNTER — Inpatient Hospital Stay: Payer: Medicare Other

## 2020-07-26 ENCOUNTER — Other Ambulatory Visit: Payer: Self-pay

## 2020-07-26 DIAGNOSIS — R262 Difficulty in walking, not elsewhere classified: Secondary | ICD-10-CM

## 2020-07-26 DIAGNOSIS — M6281 Muscle weakness (generalized): Secondary | ICD-10-CM | POA: Diagnosis not present

## 2020-07-26 NOTE — Therapy (Signed)
[place] MAIN [facility] SERVICES [address] [place], [place], [address] Phone: [phone]   Fax:  [phone]  Physical Therapy Treatment  Patient Details  Name: Sydney Mcdaniel MRN: [id number] Date of Birth: Mar 30, 1925 Referring Provider (PT): Delsa Grana   Encounter Date: 07/26/2020   PT End of Session - 07/26/20 1633    Visit Number 34    Number of Visits 44    Date for PT Re-Evaluation 08/11/20    Authorization Type 4/10 PN 07/07/20    PT Start Time 1015    PT Stop Time 1059    PT Time Calculation (min) 44 min    Equipment Utilized During Treatment Gait belt    Activity Tolerance Patient tolerated treatment well    Behavior During Therapy [facility] for tasks assessed/performed           Past Medical History:  Diagnosis Date  . Allergy   . Anemia   . Cataract   . CLL (chronic lymphocytic leukemia) ([place])   . GERD (gastroesophageal reflux disease)   . Glaucoma   . H/O: hysterectomy    Total  . Hyperlipidemia   . Hypertension   . Hypothyroidism   . Impaired fasting glucose   . Lichen sclerosus   . Osteoporosis    Hips  . PAF (paroxysmal atrial fibrillation) ([place])    a. 06/15/2016 Event monitor: 4% afib burden; b. CHA2DS2VASc - 4-->Xarelto.  . Syncope    a. 03/2016 Echo: EF55-60%, no rwma, mild AI/MR, nl PASP; b. 03/2016 48h Holter: no significant arrhythmias/pauses; c. 03/2016 MV: mild apical defect, likely breast attenuation, nl EF, low risk; d. 05/2016 Event monitor: No significant arrhythmia; e. 05/2016 Event monitor: PAF (4%).    Past Surgical History:  Procedure Laterality Date  . APPENDECTOMY    . EYE SURGERY     Glaucoma  . TOTAL ABDOMINAL HYSTERECTOMY      There were no vitals filed for this visit.   Subjective Assessment - 07/26/20 1631    Subjective Patient presents with her daughter, reports she has been having difficulty with walking sticks.    Pertinent History Patient had HHPT 10 months ago. Before that  she was in the hospital with BP issues over night. She was walking with spc outdoors, inside she holds to the walls.    Limitations Walking;Standing    How long can you stand comfortably? 15 minutes    How long can you walk comfortably? 5 mins    Patient Stated Goals to have better balance and walk better    Currently in Pain? No/denies              Ther-ex  6" step toe taps 12x each LE, no UE support one near LOB 6" step lateral step up/down SUE support, 10x each LE RTB around ankles: side step 4x length of  // bars Sumo squat with chair behind and BUE support 10x  GTB hip extension 10x each LE  Seated: Sit to stand with overhead ball raise 10x    Neuromuscular Re-education by support bar:   Ambulating with walking sticks 4x 60 ft with cues for contralateral use, patient initially challenged with sequencing and has frequent shuffle steps but improves with repetition and encouragement.  Negotiate obstacles with walking sticks x 4 cones x 4 trials with improved sequencing with repetition until last set when LUE fatigued.     Patient performed with instruction, verbal cues, tactile cues of therapist: goal: increase tissue extensibility, promote  proper posture, improve mobility   vitals monitored throughout session    Patient demonstrates excellent motivation throughout physical therapy session. She was re-introduced to walking sticks as she has been having difficulty with hers at home. Initially patient was too focused on steps with limited step length however improved with repetition. The patient continues to benefit from additional skilled PT intervention to further improve balance and overall strength for improved confidence and ability to ambulate household and community distances                    PT Education - 07/26/20 1632    Education Details exercise technique, body mechanics, walking sticks    Person(s) Educated Patient    Methods  Explanation;Demonstration;Tactile cues;Verbal cues    Comprehension Verbalized understanding;Returned demonstration;Verbal cues required;Tactile cues required            PT Short Term Goals - 06/16/20 1224      PT SHORT TERM GOAL #1   Title Patient (> 15 years old) will complete five times sit to stand test in < 15 seconds indicating an increased LE strength and improved balance.    Baseline 12/22: 16seconds    Time 4    Period Weeks    Status Partially Met    Target Date 07/14/20             PT Long Term Goals - 07/07/20 1737      PT LONG TERM GOAL #1   Title Patient will increase BLE gross strength to 4+/5 as to improve functional strength for independent gait, increased standing tolerance and increased ADL ability.    Baseline 12/22: grossly 4/5 LLE with RLE 4+/5    Time 8    Period Weeks    Status Partially Met    Target Date 08/11/20      PT LONG TERM GOAL #2   Title Patient will increase Berg Balance score toi >45  to decrease falls risk.    Baseline '@eval'  28; 10/7: 39., 04/21/20=32/56 12/22: 48/56    Time 8    Period Weeks    Status Achieved      PT LONG TERM GOAL #3   Title Patient will reduce timed up and go to <11 seconds to reduce fall risk and demonstrate improved transfer/gait ability.    Baseline At eval 27sec, 10/7: 27sec (some confusion about sitting at end) eschews SPC use, 04/21/20=27.28 sec, 05/19/20=16.13sec 12/22: 15.8 seconds w/o AD 1/12: 21.45 with walking sticks due to confusion    Time 8    Period Weeks    Status On-going    Target Date 08/11/20      PT LONG TERM GOAL #4   Title Patient will increase 10 meter walk test to >1.14ms as to improve gait speed for better community ambulation and to reduce fall risk.    Baseline 48sec at eval; 10/7: 19sec self selected, 16sec fastest, 04/21/20/= .47 m/ sec, 05/19/20=.[id number] 12/22: 0.67 m/s w AD 19 seconds w/o AD 1/12: 0.56 m/s with walking sticks    Time 8    Period Weeks    Status Partially Met     Target Date 08/11/20      PT LONG TERM GOAL #5   Title Patient will increase FOTO score to equal to or greater than 58 to demonstrate statistically significant improvement in mobility and quality of life.    Baseline 12/22: 54%    Time 8    Period Weeks    Status New  PT LONG TERM GOAL #6   Title Patient will increase Berg Balance by > 4 points (52/56) to demonstrate decreased fall risk during functional activities.    Baseline 12/22: 48/56    Time 8    Period Weeks    Status New    Target Date 08/11/20                 Plan - 07/26/20 1641    Clinical Impression Statement Patient demonstrates excellent motivation throughout physical therapy session. She was re-introduced to walking sticks as she has been having difficulty with hers at home. Initially patient was too focused on steps with limited step length however improved with repetition. The patient continues to benefit from additional skilled PT intervention to further improve balance and overall strength for improved confidence and ability to ambulate household and community distances    Personal Factors and Comorbidities Age;Comorbidity 1;Comorbidity 2    Comorbidities HTN, osteoporosis, visual impairment, back pain, incontenence, Cancer    Examination-Activity Limitations Bathing    Stability/Clinical Decision Making Stable/Uncomplicated    Rehab Potential Good    PT Frequency 2x / week    PT Duration 8 weeks    PT Treatment/Interventions Neuromuscular re-education;Therapeutic exercise;Therapeutic activities;Balance training;Functional mobility training;Gait training    PT Next Visit Plan getting up/down floor , balance, walking progression    PT Home Exercise Plan needs a revision, renovation, redux    Consulted and Agree with Plan of Care Patient;Family member/caregiver    Family Member Consulted DTR           Patient will benefit from skilled therapeutic intervention in order to improve the following  deficits and impairments:  Abnormal gait,Decreased activity tolerance,Decreased endurance,Decreased knowledge of use of DME,Impaired flexibility,Difficulty walking,Decreased balance,Pain  Visit Diagnosis: Difficulty in walking, not elsewhere classified  Muscle weakness (generalized)     Problem List Patient Active Problem List   Diagnosis Date Noted  . Normocytic anemia 05/31/2020  . Hematochezia 04/11/2020  . Acute colitis 04/11/2020  . GI bleeding 04/11/2020  . Iron deficiency anemia 03/08/2020  . Goals of care, counseling/discussion 03/08/2020  . 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 Sinus bradycardia 04/09/2019  . First degree AV block 04/09/2019  . Postural dizziness with presyncope 02/25/2019  . Osteoporosis 08/21/2018  . Osteoarthritis of left hip 05/21/2018  . Bilateral carotid artery stenosis 04/19/2018  . History of syncope 10/17/2017  . MCI (mild cognitive impairment) 08/12/2017  . Lung nodules 07/09/2017  . Paroxysmal atrial fibrillation ([place]) 04/18/2017  . Pleural effusion 10/20/2016  . Hyponatremia 10/20/2016  . Chronic diastolic congestive heart failure ([place]) 06/28/2016  . Primary open-angle glaucoma, bilateral, mild stage 05/08/2016  . Labile blood glucose 04/27/2016  . Bladder prolapse, female, acquired 02/25/2016  . 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  . B12 deficiency 01/11/2016  . Anemia 01/10/2016  . CLL (chronic lymphocytic leukemia) ([place]) 01/10/2016  . 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 12/21/2015  . Bradycardia 12/21/2015  . Lymphocytosis 06/25/2015  . Impaired fasting glucose 06/22/2015  . Carotid atherosclerosis 06/22/2015  . Essential hypertension 12/22/2014  . Hyperlipidemia 12/22/2014  . Hypothyroidism 12/22/2014   Janna Arch, PT, DPT    07/26/2020, 4:45 PM  [place] MAIN [facility] SERVICES [address]  [place], [place], [address] Phone: [phone]   Fax:  [phone]  Name: Sydney Mcdaniel MRN: [id number] Date of Birth: [birth year]

## 2020-07-28 ENCOUNTER — Ambulatory Visit: Payer: Medicare Other | Attending: Family Medicine

## 2020-07-28 ENCOUNTER — Other Ambulatory Visit: Payer: Self-pay

## 2020-07-28 DIAGNOSIS — M6281 Muscle weakness (generalized): Secondary | ICD-10-CM

## 2020-07-28 DIAGNOSIS — R262 Difficulty in walking, not elsewhere classified: Secondary | ICD-10-CM | POA: Diagnosis not present

## 2020-07-28 NOTE — Therapy (Signed)
[place] MAIN [facility] SERVICES [address] [place], [place], [address] Phone: [phone]   Fax:  [phone]  Physical Therapy Treatment  Patient Details  Name: Sydney Mcdaniel MRN: [id number] Date of Birth: 10-18-1924 Referring Provider (PT): Delsa Grana   Encounter Date: 07/28/2020   PT End of Session - 07/28/20 1303    Visit Number 35    Number of Visits 44    Date for PT Re-Evaluation 08/11/20    Authorization Type 5/10 PN 07/07/20    PT Start Time 1259    PT Stop Time 1344    PT Time Calculation (min) 45 min    Equipment Utilized During Treatment Gait belt    Activity Tolerance Patient tolerated treatment well    Behavior During Therapy [facility] for tasks assessed/performed           Past Medical History:  Diagnosis Date  . Allergy   . Anemia   . Cataract   . CLL (chronic lymphocytic leukemia) ([place])   . GERD (gastroesophageal reflux disease)   . Glaucoma   . H/O: hysterectomy    Total  . Hyperlipidemia   . Hypertension   . Hypothyroidism   . Impaired fasting glucose   . Lichen sclerosus   . Osteoporosis    Hips  . PAF (paroxysmal atrial fibrillation) ([place])    a. 06/15/2016 Event monitor: 4% afib burden; b. CHA2DS2VASc - 4-->Xarelto.  . Syncope    a. 03/2016 Echo: EF55-60%, no rwma, mild AI/MR, nl PASP; b. 03/2016 48h Holter: no significant arrhythmias/pauses; c. 03/2016 MV: mild apical defect, likely breast attenuation, nl EF, low risk; d. 05/2016 Event monitor: No significant arrhythmia; e. 05/2016 Event monitor: PAF (4%).    Past Surgical History:  Procedure Laterality Date  . APPENDECTOMY    . EYE SURGERY     Glaucoma  . TOTAL ABDOMINAL HYSTERECTOMY      There were no vitals filed for this visit.   Subjective Assessment - 07/28/20 1302    Subjective Patient presents with daughter, no falls or LOB since last session. Has been compliant with HEP.    Pertinent History Patient had HHPT 10 months ago. Before that  she was in the hospital with BP issues over night. She was walking with spc outdoors, inside she holds to the walls.    Limitations Walking;Standing    How long can you stand comfortably? 15 minutes    How long can you walk comfortably? 5 mins    Patient Stated Goals to have better balance and walk better    Currently in Pain? No/denies              Ther-ex  NuStep Lvl 3 4 minutes for cardiovascular and musculoskeletal challenge   6" stair step up/down 10x each LE, BUE support 6" steps; two consecutive lateral step up/down SUE support, 10x each LE Stair negotiation, step over step, BUE support on railing RTB around ankles: side step 4x length of  // bars Sumo squat with chair behind and BUE support 10x  GTB hip extension 10x each LE   Seated: Sit to stand with overhead ball raise 10x   GTB around ankles: alternating LAQ 15x each LE  Neuromuscular Re-education by support bar:   airex pad: horizontal head turns 15x, vertical head turns 15x  Bosu ball (round side up) 12x each LE, finger tip support  Seated cone taps 10x each LE      Patient performed with instruction, verbal cues,  tactile cues of therapist: goal: increase tissue extensibility, promote proper posture, improve mobility   vitals monitored throughout session    Patient tolerated stair negotiation well with cues for sequencing and close CGA. She initially is fearful of step negotiation however demonstrates improved confidence by end of session. The patient continues to benefit from additional skilled PT intervention to further improve balance and overall strength for improved confidence and ability to ambulate household and community distances                      PT Education - 07/28/20 1303    Education Details exercise technique, body mechanics,    Person(s) Educated Patient    Methods Explanation;Demonstration;Tactile cues;Verbal cues    Comprehension Verbalized understanding;Returned  demonstration;Verbal cues required;Tactile cues required            PT Short Term Goals - 06/16/20 1224      PT SHORT TERM GOAL #1   Title Patient (> 63 years old) will complete five times sit to stand test in < 15 seconds indicating an increased LE strength and improved balance.    Baseline 12/22: 16seconds    Time 4    Period Weeks    Status Partially Met    Target Date 07/14/20             PT Long Term Goals - 07/07/20 1737      PT LONG TERM GOAL #1   Title Patient will increase BLE gross strength to 4+/5 as to improve functional strength for independent gait, increased standing tolerance and increased ADL ability.    Baseline 12/22: grossly 4/5 LLE with RLE 4+/5    Time 8    Period Weeks    Status Partially Met    Target Date 08/11/20      PT LONG TERM GOAL #2   Title Patient will increase Berg Balance score toi >45  to decrease falls risk.    Baseline _0  28; 10/7: 39., 04/21/20=32/56 12/22: 48/56    Time 8    Period Weeks    Status Achieved      PT LONG TERM GOAL #3   Title Patient will reduce timed up and go to <11 seconds to reduce fall risk and demonstrate improved transfer/gait ability.    Baseline At eval 27sec, 10/7: 27sec (some confusion about sitting at end) eschews SPC use, 04/21/20=27.28 sec, 05/19/20=16.13sec 12/22: 15.8 seconds w/o AD 1/12: 21.45 with walking sticks due to confusion    Time 8    Period Weeks    Status On-going    Target Date 08/11/20      PT LONG TERM GOAL #4   Title Patient will increase 10 meter walk test to >1.76ms as to improve gait speed for better community ambulation and to reduce fall risk.    Baseline 48sec at eval; 10/7: 19sec self selected, 16sec fastest, 04/21/20/= .47 m/ sec, 05/19/20=.774mec 12/22: 0.67 m/s w AD 19 seconds w/o AD 1/12: 0.56 m/s with walking sticks    Time 8    Period Weeks    Status Partially Met    Target Date 08/11/20      PT LONG TERM GOAL #5   Title Patient will increase FOTO score to equal  to or greater than 58 to demonstrate statistically significant improvement in mobility and quality of life.    Baseline 12/22: 54%    Time 8    Period Weeks    Status New  PT LONG TERM GOAL #6   Title Patient will increase Berg Balance by > 4 points (52/56) to demonstrate decreased fall risk during functional activities.    Baseline 12/22: 48/56    Time 8    Period Weeks    Status New    Target Date 08/11/20                 Plan - 07/28/20 1350    Clinical Impression Statement Patient tolerated stair negotiation well with cues for sequencing and close CGA. She initially is fearful of step negotiation however demonstrates improved confidence by end of session. The patient continues to benefit from additional skilled PT intervention to further improve balance and overall strength for improved confidence and ability to ambulate household and community distances    Personal Factors and Comorbidities Age;Comorbidity 1;Comorbidity 2    Comorbidities HTN, osteoporosis, visual impairment, back pain, incontenence, Cancer    Examination-Activity Limitations Bathing    Stability/Clinical Decision Making Stable/Uncomplicated    Rehab Potential Good    PT Frequency 2x / week    PT Duration 8 weeks    PT Treatment/Interventions Neuromuscular re-education;Therapeutic exercise;Therapeutic activities;Balance training;Functional mobility training;Gait training    PT Next Visit Plan getting up/down floor , balance, walking progression    PT Home Exercise Plan needs a revision, renovation, redux    Consulted and Agree with Plan of Care Patient;Family member/caregiver    Family Member Consulted DTR           Patient will benefit from skilled therapeutic intervention in order to improve the following deficits and impairments:  Abnormal gait,Decreased activity tolerance,Decreased endurance,Decreased knowledge of use of DME,Impaired flexibility,Difficulty walking,Decreased balance,Pain  Visit  Diagnosis: Difficulty in walking, not elsewhere classified  Muscle weakness (generalized)     Problem List Patient Active Problem List   Diagnosis Date Noted  . Normocytic anemia 05/31/2020  . Hematochezia 04/11/2020  . Acute colitis 04/11/2020  . GI bleeding 04/11/2020  . Iron deficiency anemia 03/08/2020  . Goals of care, counseling/discussion 03/08/2020  . 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 Sinus bradycardia 04/09/2019  . First degree AV block 04/09/2019  . Postural dizziness with presyncope 02/25/2019  . Osteoporosis 08/21/2018  . Osteoarthritis of left hip 05/21/2018  . Bilateral carotid artery stenosis 04/19/2018  . History of syncope 10/17/2017  . MCI (mild cognitive impairment) 08/12/2017  . Lung nodules 07/09/2017  . Paroxysmal atrial fibrillation ([place]) 04/18/2017  . Pleural effusion 10/20/2016  . Hyponatremia 10/20/2016  . Chronic diastolic congestive heart failure ([place]) 06/28/2016  . Primary open-angle glaucoma, bilateral, mild stage 05/08/2016  . Labile blood glucose 04/27/2016  . Bladder prolapse, female, acquired 02/25/2016  . 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  . B12 deficiency 01/11/2016  . Anemia 01/10/2016  . CLL (chronic lymphocytic leukemia) ([place]) 01/10/2016  . 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 12/21/2015  . Bradycardia 12/21/2015  . Lymphocytosis 06/25/2015  . Impaired fasting glucose 06/22/2015  . Carotid atherosclerosis 06/22/2015  . Essential hypertension 12/22/2014  . Hyperlipidemia 12/22/2014  . Hypothyroidism 12/22/2014   Sydney Mcdaniel, PT, DPT   07/28/2020, 1:52 PM  [place] MAIN [facility] SERVICES [address] [place], [place], [address] Phone: [phone]   Fax:  [phone]  Name: KINLIE JANICE MRN: [id number] Date of Birth: 11-06-1924

## 2020-08-02 ENCOUNTER — Other Ambulatory Visit: Payer: Self-pay

## 2020-08-02 ENCOUNTER — Inpatient Hospital Stay: Payer: Medicare Other

## 2020-08-02 ENCOUNTER — Inpatient Hospital Stay: Payer: Medicare Other | Attending: Hematology and Oncology

## 2020-08-02 VITALS — BP 178/71 | HR 74 | Temp 96.0°F | Resp 18

## 2020-08-02 DIAGNOSIS — E538 Deficiency of other specified B group vitamins: Secondary | ICD-10-CM | POA: Diagnosis not present

## 2020-08-02 MED ORDER — CYANOCOBALAMIN 1000 MCG/ML IJ SOLN
1000.0000 ug | Freq: Once | INTRAMUSCULAR | Status: AC
  Administered 2020-08-02: 1000 ug via INTRAMUSCULAR
  Filled 2020-08-02: qty 1

## 2020-08-04 ENCOUNTER — Other Ambulatory Visit: Payer: Self-pay

## 2020-08-04 ENCOUNTER — Ambulatory Visit: Payer: Medicare Other

## 2020-08-04 DIAGNOSIS — R262 Difficulty in walking, not elsewhere classified: Secondary | ICD-10-CM

## 2020-08-04 DIAGNOSIS — M6281 Muscle weakness (generalized): Secondary | ICD-10-CM

## 2020-08-04 NOTE — Therapy (Signed)
[place] MAIN [facility] SERVICES [address] [place], [place], [address] Phone: [phone]   Fax:  [phone]  Physical Therapy Treatment  Patient Details  Name: Sydney Mcdaniel MRN: [id number] Date of Birth: [birth year] Referring Provider (PT): Delsa Grana   Encounter Date: 08/04/2020   PT End of Session - 08/04/20 1306    Visit Number 36    Number of Visits 44    Date for PT Re-Evaluation 08/11/20    Authorization Type 6/10 PN 07/07/20    PT Start Time 1259    PT Stop Time 1344    PT Time Calculation (min) 45 min    Equipment Utilized During Treatment Gait belt    Activity Tolerance Patient tolerated treatment well    Behavior During Therapy [facility] for tasks assessed/performed           Past Medical History:  Diagnosis Date  . Allergy   . Anemia   . Cataract   . CLL (chronic lymphocytic leukemia) ([place])   . GERD (gastroesophageal reflux disease)   . Glaucoma   . H/O: hysterectomy    Total  . Hyperlipidemia   . Hypertension   . Hypothyroidism   . Impaired fasting glucose   . Lichen sclerosus   . Osteoporosis    Hips  . PAF (paroxysmal atrial fibrillation) ([place])    a. 06/15/2016 Event monitor: 4% afib burden; b. CHA2DS2VASc - 4-->Xarelto.  . Syncope    a. 03/2016 Echo: EF55-60%, no rwma, mild AI/MR, nl PASP; b. 03/2016 48h Holter: no significant arrhythmias/pauses; c. 03/2016 MV: mild apical defect, likely breast attenuation, nl EF, low risk; d. 05/2016 Event monitor: No significant arrhythmia; e. 05/2016 Event monitor: PAF (4%).    Past Surgical History:  Procedure Laterality Date  . APPENDECTOMY    . EYE SURGERY     Glaucoma  . TOTAL ABDOMINAL HYSTERECTOMY      There were no vitals filed for this visit.   Subjective Assessment - 08/04/20 1304    Subjective Patient presents with her daughter, reports no falls or LOB since last session.    Pertinent History Patient had HHPT 10 months ago. Before that she was in the  hospital with BP issues over night. She was walking with spc outdoors, inside she holds to the walls.    Limitations Walking;Standing    How long can you stand comfortably? 15 minutes    How long can you walk comfortably? 5 mins    Patient Stated Goals to have better balance and walk better    Currently in Pain? No/denies                Ther-ex  NuStep Lvl 3 4 minutes for cardiovascular and musculoskeletal challenge    Seated: Sit to stand grabbing ball and throwing at target x 15  GTB around ankles: alternating LAQ 15x each LE GTB around toes, march 15x each LE with step under feet for proper alignment very challenging 15x  GTB hamstring curls against PT resistance 12x each LE    Neuromuscular Re-education by support bar:    ambulate holding small bag with water weight in it without AD, 110 ft with close CGA ; no LOB  Ambulate negotiating obstacles holding small cup half full of water for carryover to home environment 110 ft no LOB. Cues required for task performance due to fear of spilling water and/or LOB Initiation/ termination of ambulation in hallway 160 ft "red light, green light" with SPC for  carryover to home and public environments. Patient fearful but able to perform with no episodes of LOB.   Standing cone taps with hand on cane one hand on PT hand 3 cone tap , x 6 trials each LE     Patient performed with instruction, verbal cues, tactile cues of therapist: goal: increase tissue extensibility, promote proper posture, improve mobility   vitals monitored throughout session     Patient is able to perform tasks for carryover to home environment without LOB. She initially is fearful of LOB but improves her confidence as task progresses. Single limb stance continues to require UE support at this time. The patient continues to benefit from additional skilled PT intervention to further improve balance and overall strength for improved confidence and ability to ambulate  household and community distances                      PT Education - 08/04/20 1305    Education Details exercise technique, body mechanics    Person(s) Educated Patient    Methods Explanation;Demonstration;Tactile cues;Verbal cues    Comprehension Verbalized understanding;Returned demonstration;Verbal cues required;Tactile cues required            PT Short Term Goals - 06/16/20 1224      PT SHORT TERM GOAL #1   Title Patient (> 33 years old) will complete five times sit to stand test in < 15 seconds indicating an increased LE strength and improved balance.    Baseline 12/22: 16seconds    Time 4    Period Weeks    Status Partially Met    Target Date 07/14/20             PT Long Term Goals - 07/07/20 1737      PT LONG TERM GOAL #1   Title Patient will increase BLE gross strength to 4+/5 as to improve functional strength for independent gait, increased standing tolerance and increased ADL ability.    Baseline 12/22: grossly 4/5 LLE with RLE 4+/5    Time 8    Period Weeks    Status Partially Met    Target Date 08/11/20      PT LONG TERM GOAL #2   Title Patient will increase Berg Balance score toi >45  to decrease falls risk.    Baseline '@eval'  28; 10/7: 39., 04/21/20=32/56 12/22: 48/56    Time 8    Period Weeks    Status Achieved      PT LONG TERM GOAL #3   Title Patient will reduce timed up and go to <11 seconds to reduce fall risk and demonstrate improved transfer/gait ability.    Baseline At eval 27sec, 10/7: 27sec (some confusion about sitting at end) eschews SPC use, 04/21/20=27.28 sec, 05/19/20=16.13sec 12/22: 15.8 seconds w/o AD 1/12: 21.45 with walking sticks due to confusion    Time 8    Period Weeks    Status On-going    Target Date 08/11/20      PT LONG TERM GOAL #4   Title Patient will increase 10 meter walk test to >1.34ms as to improve gait speed for better community ambulation and to reduce fall risk.    Baseline 48sec at eval; 10/7:  19sec self selected, 16sec fastest, 04/21/20/= .47 m/ sec, 05/19/20=.722mec 12/22: 0.67 m/s w AD 19 seconds w/o AD 1/12: 0.56 m/s with walking sticks    Time 8    Period Weeks    Status Partially Met    Target Date  08/11/20      PT LONG TERM GOAL #5   Title Patient will increase FOTO score to equal to or greater than 58 to demonstrate statistically significant improvement in mobility and quality of life.    Baseline 12/22: 54%    Time 8    Period Weeks    Status New      PT LONG TERM GOAL #6   Title Patient will increase Berg Balance by > 4 points (52/56) to demonstrate decreased fall risk during functional activities.    Baseline 12/22: 48/56    Time 8    Period Weeks    Status New    Target Date 08/11/20                 Plan - 08/04/20 1450    Clinical Impression Statement Patient is able to perform tasks for carryover to home environment without LOB. She initially is fearful of LOB but improves her confidence as task progresses. Single limb stance continues to require UE support at this time. The patient continues to benefit from additional skilled PT intervention to further improve balance and overall strength for improved confidence and ability to ambulate household and community distances    Personal Factors and Comorbidities Age;Comorbidity 1;Comorbidity 2    Comorbidities HTN, osteoporosis, visual impairment, back pain, incontenence, Cancer    Examination-Activity Limitations Bathing    Stability/Clinical Decision Making Stable/Uncomplicated    Rehab Potential Good    PT Frequency 2x / week    PT Duration 8 weeks    PT Treatment/Interventions Neuromuscular re-education;Therapeutic exercise;Therapeutic activities;Balance training;Functional mobility training;Gait training    PT Next Visit Plan getting up/down floor , balance, walking progression    PT Home Exercise Plan needs a revision, renovation, redux    Consulted and Agree with Plan of Care Patient;Family  member/caregiver    Family Member Consulted DTR           Patient will benefit from skilled therapeutic intervention in order to improve the following deficits and impairments:  Abnormal gait,Decreased activity tolerance,Decreased endurance,Decreased knowledge of use of DME,Impaired flexibility,Difficulty walking,Decreased balance,Pain  Visit Diagnosis: Difficulty in walking, not elsewhere classified  Muscle weakness (generalized)     Problem List Patient Active Problem List   Diagnosis Date Noted  . Normocytic anemia 05/31/2020  . Hematochezia 04/11/2020  . Acute colitis 04/11/2020  . GI bleeding 04/11/2020  . Iron deficiency anemia 03/08/2020  . Goals of care, counseling/discussion 03/08/2020  . 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 Sinus bradycardia 04/09/2019  . First degree AV block 04/09/2019  . Postural dizziness with presyncope 02/25/2019  . Osteoporosis 08/21/2018  . Osteoarthritis of left hip 05/21/2018  . Bilateral carotid artery stenosis 04/19/2018  . History of syncope 10/17/2017  . MCI (mild cognitive impairment) 08/12/2017  . Lung nodules 07/09/2017  . Paroxysmal atrial fibrillation ([place]) 04/18/2017  . Pleural effusion 10/20/2016  . Hyponatremia 10/20/2016  . Chronic diastolic congestive heart failure ([place]) 06/28/2016  . Primary open-angle glaucoma, bilateral, mild stage 05/08/2016  . Labile blood glucose 04/27/2016  . Bladder prolapse, female, acquired 02/25/2016  . 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  . B12 deficiency 01/11/2016  . Anemia 01/10/2016  . CLL (chronic lymphocytic leukemia) ([place]) 01/10/2016  . 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 12/21/2015  . Bradycardia 12/21/2015  . Lymphocytosis 06/25/2015  . Impaired fasting glucose 06/22/2015  . Carotid atherosclerosis 06/22/2015  . Essential hypertension 12/22/2014  .  Hyperlipidemia 12/22/2014  . Hypothyroidism  12/22/2014   Janna Arch, PT, DPT   08/04/2020, 2:51 PM  [place] MAIN [facility] SERVICES [address] [place], [place], [address] Phone: [phone]   Fax:  [phone]  Name: Sydney Mcdaniel MRN: [id number] Date of Birth: 02-08-1925

## 2020-08-09 ENCOUNTER — Ambulatory Visit: Payer: Medicare Other

## 2020-08-09 ENCOUNTER — Other Ambulatory Visit: Payer: Self-pay

## 2020-08-09 DIAGNOSIS — M6281 Muscle weakness (generalized): Secondary | ICD-10-CM

## 2020-08-09 DIAGNOSIS — R262 Difficulty in walking, not elsewhere classified: Secondary | ICD-10-CM

## 2020-08-09 NOTE — Therapy (Signed)
[place] MAIN [facility] SERVICES [address] [place], [place], [address] Phone: [phone]   Fax:  [phone]  Physical Therapy Treatment  Patient Details  Name: Sydney Mcdaniel MRN: [id number] Date of Birth: 21-Dec-1924 Referring Provider (PT): Delsa Grana   Encounter Date: 08/09/2020   PT End of Session - 08/09/20 0838    Visit Number 37    Number of Visits 44    Date for PT Re-Evaluation 08/11/20    Authorization Type 7/10 PN 07/07/20    PT Start Time 0845    PT Stop Time 0929    PT Time Calculation (min) 44 min    Equipment Utilized During Treatment Gait belt    Activity Tolerance Patient tolerated treatment well    Behavior During Therapy [facility] for tasks assessed/performed           Past Medical History:  Diagnosis Date  . Allergy   . Anemia   . Cataract   . CLL (chronic lymphocytic leukemia) ([place])   . GERD (gastroesophageal reflux disease)   . Glaucoma   . H/O: hysterectomy    Total  . Hyperlipidemia   . Hypertension   . Hypothyroidism   . Impaired fasting glucose   . Lichen sclerosus   . Osteoporosis    Hips  . PAF (paroxysmal atrial fibrillation) ([place])    a. 06/15/2016 Event monitor: 4% afib burden; b. CHA2DS2VASc - 4-->Xarelto.  . Syncope    a. 03/2016 Echo: EF55-60%, no rwma, mild AI/MR, nl PASP; b. 03/2016 48h Holter: no significant arrhythmias/pauses; c. 03/2016 MV: mild apical defect, likely breast attenuation, nl EF, low risk; d. 05/2016 Event monitor: No significant arrhythmia; e. 05/2016 Event monitor: PAF (4%).    Past Surgical History:  Procedure Laterality Date  . APPENDECTOMY    . EYE SURGERY     Glaucoma  . TOTAL ABDOMINAL HYSTERECTOMY      There were no vitals filed for this visit.   Subjective Assessment - 08/09/20 0837    Subjective Patient presents with daugther, went to church over the weekend.    Pertinent History Patient had HHPT 10 months ago. Before that she was in the hospital with BP  issues over night. She was walking with spc outdoors, inside she holds to the walls.    Limitations Walking;Standing    How long can you stand comfortably? 15 minutes    How long can you walk comfortably? 5 mins    Patient Stated Goals to have better balance and walk better    Currently in Pain? No/denies                     Ther-ex  NuStep Lvl 3 4 minutes for cardiovascular and musculoskeletal challenge   Standing:  3lb ankle weight: -forward marching 4x length of // bars  -lateral stepping 4x length of // bars   Seated: Sit to stand grabbing ball and throwing at target x 15    Neuromuscular Re-education by support bar:     ambulate holding small bag with water weight in it without AD, 110 ft with close CGA ; no LOB x2 trials   airexbalance beam: grab ball and toss at target x 12 balls for pertubation's on unstable surfacesx2 trials airex balance beam: lateral stepping 4x length of // bars no LOB  Speed ladder: one foot each step 8x length of // bars with decreasing UE support  Grape vine x4 lengths of // bars no LOB, very  challenging.   Patient performed with instruction, verbal cues, tactile cues of therapist: goal: increase tissue extensibility, promote proper posture, improve mobility   vitals monitored throughout session                      PT Education - 08/09/20 0838    Education Details body mechanics, exercise technique    Person(s) Educated Patient    Methods Explanation;Demonstration;Tactile cues;Verbal cues    Comprehension Verbalized understanding;Returned demonstration;Verbal cues required;Tactile cues required            PT Short Term Goals - 06/16/20 1224      PT SHORT TERM GOAL #1   Title Patient (> 65 years old) will complete five times sit to stand test in < 15 seconds indicating an increased LE strength and improved balance.    Baseline 12/22: 16seconds    Time 4    Period Weeks    Status Partially Met    Target  Date 07/14/20             PT Long Term Goals - 07/07/20 1737      PT LONG TERM GOAL #1   Title Patient will increase BLE gross strength to 4+/5 as to improve functional strength for independent gait, increased standing tolerance and increased ADL ability.    Baseline 12/22: grossly 4/5 LLE with RLE 4+/5    Time 8    Period Weeks    Status Partially Met    Target Date 08/11/20      PT LONG TERM GOAL #2   Title Patient will increase Berg Balance score toi >45  to decrease falls risk.    Baseline '@eval'  28; 10/7: 39., 04/21/20=32/56 12/22: 48/56    Time 8    Period Weeks    Status Achieved      PT LONG TERM GOAL #3   Title Patient will reduce timed up and go to <11 seconds to reduce fall risk and demonstrate improved transfer/gait ability.    Baseline At eval 27sec, 10/7: 27sec (some confusion about sitting at end) eschews SPC use, 04/21/20=27.28 sec, 05/19/20=16.13sec 12/22: 15.8 seconds w/o AD 1/12: 21.45 with walking sticks due to confusion    Time 8    Period Weeks    Status On-going    Target Date 08/11/20      PT LONG TERM GOAL #4   Title Patient will increase 10 meter walk test to >1.59ms as to improve gait speed for better community ambulation and to reduce fall risk.    Baseline 48sec at eval; 10/7: 19sec self selected, 16sec fastest, 04/21/20/= .47 m/ sec, 05/19/20=.727mec 12/22: 0.67 m/s w AD 19 seconds w/o AD 1/12: 0.56 m/s with walking sticks    Time 8    Period Weeks    Status Partially Met    Target Date 08/11/20      PT LONG TERM GOAL #5   Title Patient will increase FOTO score to equal to or greater than 58 to demonstrate statistically significant improvement in mobility and quality of life.    Baseline 12/22: 54%    Time 8    Period Weeks    Status New      PT LONG TERM GOAL #6   Title Patient will increase Berg Balance by > 4 points (52/56) to demonstrate decreased fall risk during functional activities.    Baseline 12/22: 48/56    Time 8    Period  Weeks    Status New  Target Date 08/11/20                 Plan - 08/09/20 1233    Clinical Impression Statement Patient is aware that next session may be her last session. She has progressed remarkably and is much more stable and safe with her functional mobility at this time. Patient is highly motivated with excellent family support. Will review discharge HEP next session. The patient continues to benefit from additional skilled PT intervention to further improve balance and overall strength for improved confidence and ability to ambulate household and community distances    Personal Factors and Comorbidities Age;Comorbidity 1;Comorbidity 2    Comorbidities HTN, osteoporosis, visual impairment, back pain, incontenence, Cancer    Examination-Activity Limitations Bathing    Stability/Clinical Decision Making Stable/Uncomplicated    Rehab Potential Good    PT Frequency 2x / week    PT Duration 8 weeks    PT Treatment/Interventions Neuromuscular re-education;Therapeutic exercise;Therapeutic activities;Balance training;Functional mobility training;Gait training    PT Next Visit Plan getting up/down floor , balance, walking progression    PT Home Exercise Plan needs a revision, renovation, redux    Consulted and Agree with Plan of Care Patient;Family member/caregiver    Family Member Consulted DTR           Patient will benefit from skilled therapeutic intervention in order to improve the following deficits and impairments:  Abnormal gait,Decreased activity tolerance,Decreased endurance,Decreased knowledge of use of DME,Impaired flexibility,Difficulty walking,Decreased balance,Pain  Visit Diagnosis: Difficulty in walking, not elsewhere classified  Muscle weakness (generalized)     Problem List Patient Active Problem List   Diagnosis Date Noted  . Normocytic anemia 05/31/2020  . Hematochezia 04/11/2020  . Acute colitis 04/11/2020  . GI bleeding 04/11/2020  . Iron  deficiency anemia 03/08/2020  . Goals of care, counseling/discussion 03/08/2020  . 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 Sinus bradycardia 04/09/2019  . First degree AV block 04/09/2019  . Postural dizziness with presyncope 02/25/2019  . Osteoporosis 08/21/2018  . Osteoarthritis of left hip 05/21/2018  . Bilateral carotid artery stenosis 04/19/2018  . History of syncope 10/17/2017  . MCI (mild cognitive impairment) 08/12/2017  . Lung nodules 07/09/2017  . Paroxysmal atrial fibrillation ([place]) 04/18/2017  . Pleural effusion 10/20/2016  . Hyponatremia 10/20/2016  . Chronic diastolic congestive heart failure ([place]) 06/28/2016  . Primary open-angle glaucoma, bilateral, mild stage 05/08/2016  . Labile blood glucose 04/27/2016  . Bladder prolapse, female, acquired 02/25/2016  . 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  . B12 deficiency 01/11/2016  . Anemia 01/10/2016  . CLL (chronic lymphocytic leukemia) ([place]) 01/10/2016  . 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 12/21/2015  . Bradycardia 12/21/2015  . Lymphocytosis 06/25/2015  . Impaired fasting glucose 06/22/2015  . Carotid atherosclerosis 06/22/2015  . Essential hypertension 12/22/2014  . Hyperlipidemia 12/22/2014  . Hypothyroidism 12/22/2014   Janna Arch, PT, DPT   08/09/2020, 12:34 PM  [place] MAIN [facility] SERVICES [address] [place], [place], [address] Phone: [phone]   Fax:  [phone]  Name: Sydney Mcdaniel MRN: [id number] Date of Birth: 09-02-1924

## 2020-08-10 ENCOUNTER — Ambulatory Visit: Payer: Medicare Other | Admitting: Family Medicine

## 2020-08-11 ENCOUNTER — Ambulatory Visit: Payer: Medicare Other

## 2020-08-11 ENCOUNTER — Other Ambulatory Visit: Payer: Self-pay

## 2020-08-11 DIAGNOSIS — R262 Difficulty in walking, not elsewhere classified: Secondary | ICD-10-CM | POA: Diagnosis not present

## 2020-08-11 DIAGNOSIS — M6281 Muscle weakness (generalized): Secondary | ICD-10-CM

## 2020-08-11 NOTE — Therapy (Signed)
[place] [facility] MAIN [facility] SERVICES [address] [place], [place], [address] Phone: [phone]   Fax:  [phone]  Physical Therapy Treatment/DISCHARGE  Patient Details  Name: Sydney Mcdaniel MRN: [id number] Date of Birth: 1925/03/12 Referring Provider (PT): Danelle Berry   Encounter Date: 08/11/2020   PT End of Session - 08/11/20 1302    Visit Number 38    Number of Visits 44    Date for PT Re-Evaluation 08/11/20    Authorization Type 8/10 PN 07/07/20    PT Start Time 0845    PT Stop Time 0929    PT Time Calculation (min) 44 min    Equipment Utilized During Treatment Gait belt    Activity Tolerance Patient tolerated treatment well    Behavior During Therapy [facility] for tasks assessed/performed           Past Medical History:  Diagnosis Date  . Allergy   . Anemia   . Cataract   . CLL (chronic lymphocytic leukemia) ([facility])   . GERD (gastroesophageal reflux disease)   . Glaucoma   . H/O: hysterectomy    Total  . Hyperlipidemia   . Hypertension   . Hypothyroidism   . Impaired fasting glucose   . Lichen sclerosus   . Osteoporosis    Hips  . PAF (paroxysmal atrial fibrillation) ([facility])    a. 06/15/2016 Event monitor: 4% afib burden; b. CHA2DS2VASc - 4-->Xarelto.  . Syncope    a. 03/2016 Echo: EF55-60%, no rwma, mild AI/MR, nl PASP; b. 03/2016 48h Holter: no significant arrhythmias/pauses; c. 03/2016 MV: mild apical defect, likely breast attenuation, nl EF, low risk; d. 05/2016 Event monitor: No significant arrhythmia; e. 05/2016 Event monitor: PAF (4%).    Past Surgical History:  Procedure Laterality Date  . APPENDECTOMY    . EYE SURGERY     Glaucoma  . TOTAL ABDOMINAL HYSTERECTOMY      There were no vitals filed for this visit.   Subjective Assessment - 08/11/20 1300    Subjective Patient and daughter present to PT, know today will be her last day.    Pertinent History Patient had HHPT 10 months ago. Before that she was in the  hospital with BP issues over night. She was walking with spc outdoors, inside she holds to the walls.    Limitations Walking;Standing    How long can you stand comfortably? 15 minutes    How long can you walk comfortably? 5 mins    Patient Stated Goals to have better balance and walk better    Currently in Pain? No/denies              [facility] PT Assessment - 08/11/20 0857      Standardized Balance Assessment   Standardized Balance Assessment Berg Balance Test      Berg Balance Test   Sit to Stand Able to stand without using hands and stabilize independently    Standing Unsupported Able to stand safely 2 minutes    Sitting with Back Unsupported but Feet Supported on Floor or Stool Able to sit safely and securely 2 minutes    Stand to Sit Sits safely with minimal use of hands    Transfers Able to transfer safely, minor use of hands    Standing Unsupported with Eyes Closed Able to stand 10 seconds safely    Standing Unsupported with Feet Together Able to place feet together independently and stand for 1 minute with supervision    From Standing, Reach  Forward with Outstretched Arm Can reach forward >12 cm safely (5")    From Standing Position, Pick up Object from [place] to pick up shoe safely and easily    From Standing Position, Turn to Look Behind Over each Shoulder Looks behind from both sides and weight shifts well    Turn 360 Degrees Able to turn 360 degrees safely in 4 seconds or less    Standing Unsupported, Alternately Place Feet on Step/Stool Able to stand independently and complete 8 steps >20 seconds    Standing Unsupported, One Foot in Front Able to take small step independently and hold 30 seconds    Standing on One Leg Able to lift leg independently and hold equal to or more than 3 seconds    Total Score 49            Discharge BLE strength: grossly 4+/5  TUG: 17 seconds no AD ; second trial 11 seconds no AD  10 MWT: 10.99 seconds =0.91 m/s  FOTO: 52%  BERG: 49/  56   Review HEP  Access Code: [email or web address] URL: [email or web address] Date: 08/09/2020 Prepared by: Janna Arch  Exercises Standing March with Counter Support - 1 x daily - 7 x weekly - 2 sets - 10 reps - 5 hold Standing Hip Extension with Counter Support - 1 x daily - 7 x weekly - 2 sets - 10 reps - 5 hold Sit to Stand - 1 x daily - 7 x weekly - 2 sets - 10 reps - 5 hold Seated Hip Abduction with Resistance - 1 x daily - 7 x weekly - 2 sets - 10 reps - 5 hold Seated Long Arc Quad - 1 x daily   Access Code: [email or web address] URL: [email or web address] Date: 08/09/2020 Prepared by: Janna Arch  Program Notes **Be sure to perform all exercises next to a countertop or sturdy piece of furniture in case you become unsteady.   Exercises Standing Heel Raise with Support - 1 x daily - 7 x weekly - 2 sets - 15 reps Standing Toe Raises at Chair - 1 x daily - 7 x weekly - 2 sets - 15 reps Mini Squat with Counter Support - 1 x daily - 7 x weekly - 2 sets - 15 reps Standing Tandem Balance with Counter Support - 1 x daily - 7 x weekly - 2 sets - 2 reps - 30 hold Standing Single Leg Stance with Counter Support - 1 x daily - 7 x weekly - 2 sets - 2 reps - 30 hold Standing March with Counter Support - 1 x daily - 7 x weekly - 3 sets - 10 reps Standing Hip Extension - 1 x daily - 7 x weekly - 3 sets - 10 reps Standing Knee Flexion - 1 x daily - 7 x weekly - 3 sets - 10 reps Standing Hip Abduction with Counter Support - 1 x daily - 7 x weekly - 3 sets - 10 reps Seated March - 1 x daily - 7 x weekly - 3 sets - 10 reps Seated Hip Adduction Isometrics with Ball - 1 x daily - 7 x weekly - 3 sets - 10 reps Seated Long Arc Quad - 1 x daily - 7 x weekly - 3 sets - 10 reps Seated Shoulder Flexion Full Range - 1 x daily - 7 x weekly - 3 sets - 10 reps Seated Shoulder Circles - 1 x daily - 7 x weekly - 3 sets - 10 reps Seated Scapular  Retraction - 1 x daily - 7 x weekly - 3 sets - 10  reps     Patient has met strength and TUG goals. She has progressed with all other goals and is ready for discharge at this time. Patient and patient's daughter educated on HEP with two packets given, one for daily performance and one for additional exercises to perform. I will be happy to see this patient again in the future as needed.                   PT Education - 08/11/20 1301    Education Details exercise technique, body mechanics    Person(s) Educated Patient    Methods Explanation;Demonstration;Tactile cues;Verbal cues    Comprehension Verbalized understanding;Returned demonstration;Verbal cues required;Tactile cues required            PT Short Term Goals - 08/11/20 0904      PT SHORT TERM GOAL #1   Title Patient (> 82 years old) will complete five times sit to stand test in < 15 seconds indicating an increased LE strength and improved balance.    Baseline 12/22: 16seconds 2/16: <15 seconds    Time 4    Period Weeks    Status Achieved    Target Date 07/14/20             PT Long Term Goals - 08/11/20 0904      PT LONG TERM GOAL #1   Title Patient will increase BLE gross strength to 4+/5 as to improve functional strength for independent gait, increased standing tolerance and increased ADL ability.    Baseline 12/22: grossly 4/5 LLE with RLE 4+/5 2/16: grossly 4+/5    Time 8    Period Weeks    Status Achieved      PT LONG TERM GOAL #2   Title Patient will increase Berg Balance score toi >45  to decrease falls risk.    Baseline _0  28; 10/7: 39., 04/21/20=32/56 12/22: 48/56    Time 8    Period Weeks    Status Achieved      PT LONG TERM GOAL #3   Title Patient will reduce timed up and go to <11 seconds to reduce fall risk and demonstrate improved transfer/gait ability.    Baseline At eval 27sec, 10/7: 27sec (some confusion about sitting at end) eschews SPC use, 04/21/20=27.28 sec, 05/19/20=16.13sec 12/22: 15.8 seconds w/o AD 1/12: 21.45 with  walking sticks due to confusion 2/16: 11 seconds no AD    Time 8    Period Weeks    Status Achieved      PT LONG TERM GOAL #4   Title Patient will increase 10 meter walk test to >1.24ms as to improve gait speed for better community ambulation and to reduce fall risk.    Baseline 48sec at eval; 10/7: 19sec self selected, 16sec fastest, 04/21/20/= .47 m/ sec, 05/19/20=.748mec 12/22: 0.67 m/s w AD 19 seconds w/o AD 1/12: 0.56 m/s with walking sticks 2/16: 0.91 m/s    Time 8    Period Weeks    Status Partially Met      PT LONG TERM GOAL #5   Title Patient will increase FOTO score to equal to or greater than 58 to demonstrate statistically significant improvement in mobility and quality of life.    Baseline 12/22: 54% 2/16: 52%    Time 8    Period Weeks    Status Partially Met      PT LONG TERM GOAL #  6   Title Patient will increase Berg Balance by > 4 points (52/56) to demonstrate decreased fall risk during functional activities.    Baseline 12/22: 48/56 2/16: 49/56    Time 8    Period Weeks    Status Partially Met                 Plan - 08/11/20 1303    Clinical Impression Statement Patient has met strength and TUG goals. She has progressed with all other goals and is ready for discharge at this time. Patient and patient's daughter educated on HEP with two packets given, one for daily performance and one for additional exercises to perform. I will be happy to see this patient again in the future as needed.    Personal Factors and Comorbidities Age;Comorbidity 1;Comorbidity 2    Comorbidities HTN, osteoporosis, visual impairment, back pain, incontenence, Cancer    Examination-Activity Limitations Bathing    Stability/Clinical Decision Making Stable/Uncomplicated    Rehab Potential Good    PT Frequency 2x / week    PT Duration 8 weeks    PT Treatment/Interventions Neuromuscular re-education;Therapeutic exercise;Therapeutic activities;Balance training;Functional mobility  training;Gait training    PT Next Visit Plan getting up/down floor , balance, walking progression    PT Home Exercise Plan needs a revision, renovation, redux    Consulted and Agree with Plan of Care Patient;Family member/caregiver    Family Member Consulted DTR           Patient will benefit from skilled therapeutic intervention in order to improve the following deficits and impairments:  Abnormal gait,Decreased activity tolerance,Decreased endurance,Decreased knowledge of use of DME,Impaired flexibility,Difficulty walking,Decreased balance,Pain  Visit Diagnosis: Muscle weakness (generalized)  Difficulty in walking, not elsewhere classified     Problem List Patient Active Problem List   Diagnosis Date Noted  . Normocytic anemia 05/31/2020  . Hematochezia 04/11/2020  . Acute colitis 04/11/2020  . GI bleeding 04/11/2020  . Iron deficiency anemia 03/08/2020  . Goals of care, counseling/discussion 03/08/2020  . 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 Sinus bradycardia 04/09/2019  . First degree AV block 04/09/2019  . Postural dizziness with presyncope 02/25/2019  . Osteoporosis 08/21/2018  . Osteoarthritis of left hip 05/21/2018  . Bilateral carotid artery stenosis 04/19/2018  . History of syncope 10/17/2017  . MCI (mild cognitive impairment) 08/12/2017  . Lung nodules 07/09/2017  . Paroxysmal atrial fibrillation ([place]) 04/18/2017  . Pleural effusion 10/20/2016  . Hyponatremia 10/20/2016  . Chronic diastolic congestive heart failure ([place]) 06/28/2016  . Primary open-angle glaucoma, bilateral, mild stage 05/08/2016  . Labile blood glucose 04/27/2016  . Bladder prolapse, female, acquired 02/25/2016  . 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  . B12 deficiency 01/11/2016  . Anemia 01/10/2016  . CLL (chronic lymphocytic leukemia) ([place]) 01/10/2016  . 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 12/21/2015  .  Bradycardia 12/21/2015  . Lymphocytosis 06/25/2015  . Impaired fasting glucose 06/22/2015  . Carotid atherosclerosis 06/22/2015  . Essential hypertension 12/22/2014  . Hyperlipidemia 12/22/2014  . Hypothyroidism 12/22/2014   Janna Arch, PT, DPT   08/11/2020, 1:05 PM  [place] MAIN [facility] SERVICES [address] [place], [place], [address] Phone: [phone]   Fax:  [phone]  Name: Sydney Mcdaniel MRN: [id number] Date of Birth: 1925/04/21

## 2020-08-17 ENCOUNTER — Ambulatory Visit: Payer: Medicare Other

## 2020-08-19 ENCOUNTER — Ambulatory Visit: Payer: Medicare Other

## 2020-08-23 ENCOUNTER — Ambulatory Visit: Payer: Medicare Other

## 2020-08-23 ENCOUNTER — Inpatient Hospital Stay: Payer: Medicare Other

## 2020-08-25 ENCOUNTER — Ambulatory Visit: Payer: Medicare Other

## 2020-08-30 ENCOUNTER — Inpatient Hospital Stay: Payer: Medicare Other | Attending: Hematology and Oncology

## 2020-08-30 ENCOUNTER — Other Ambulatory Visit: Payer: Self-pay

## 2020-08-30 ENCOUNTER — Ambulatory Visit: Payer: Medicare Other

## 2020-08-30 DIAGNOSIS — E538 Deficiency of other specified B group vitamins: Secondary | ICD-10-CM | POA: Insufficient documentation

## 2020-08-30 MED ORDER — CYANOCOBALAMIN 1000 MCG/ML IJ SOLN
1000.0000 ug | Freq: Once | INTRAMUSCULAR | Status: AC
  Administered 2020-08-30: 1000 ug via INTRAMUSCULAR
  Filled 2020-08-30: qty 1

## 2020-09-01 ENCOUNTER — Ambulatory Visit: Payer: Medicare Other

## 2020-09-02 ENCOUNTER — Ambulatory Visit: Payer: Self-pay | Admitting: Family Medicine

## 2020-09-06 ENCOUNTER — Ambulatory Visit: Payer: Medicare Other

## 2020-09-08 ENCOUNTER — Ambulatory Visit: Payer: Medicare Other | Admitting: Family Medicine

## 2020-09-08 ENCOUNTER — Ambulatory Visit: Payer: Medicare Other

## 2020-09-13 ENCOUNTER — Ambulatory Visit (INDEPENDENT_AMBULATORY_CARE_PROVIDER_SITE_OTHER): Payer: Medicare Other | Admitting: Family Medicine

## 2020-09-13 ENCOUNTER — Encounter: Payer: Self-pay | Admitting: Family Medicine

## 2020-09-13 ENCOUNTER — Ambulatory Visit: Payer: Medicare Other

## 2020-09-13 ENCOUNTER — Other Ambulatory Visit: Payer: Self-pay

## 2020-09-13 VITALS — BP 170/62 | HR 61 | Temp 98.0°F | Resp 16 | Ht 62.0 in | Wt 141.0 lb

## 2020-09-13 DIAGNOSIS — E538 Deficiency of other specified B group vitamins: Secondary | ICD-10-CM | POA: Diagnosis not present

## 2020-09-13 DIAGNOSIS — M810 Age-related osteoporosis without current pathological fracture: Secondary | ICD-10-CM

## 2020-09-13 DIAGNOSIS — E039 Hypothyroidism, unspecified: Secondary | ICD-10-CM

## 2020-09-13 DIAGNOSIS — C9110 Chronic lymphocytic leukemia of B-cell type not having achieved remission: Secondary | ICD-10-CM | POA: Diagnosis not present

## 2020-09-13 DIAGNOSIS — I10 Essential (primary) hypertension: Secondary | ICD-10-CM

## 2020-09-13 DIAGNOSIS — I480 Paroxysmal atrial fibrillation: Secondary | ICD-10-CM | POA: Diagnosis not present

## 2020-09-13 NOTE — Assessment & Plan Note (Signed)
Rechecking labs today.

## 2020-09-13 NOTE — Assessment & Plan Note (Signed)
Doing well on current regimen, no changes made today. 

## 2020-09-13 NOTE — Assessment & Plan Note (Signed)
In reg rhythm today. Continue xarelto and following with cardiology.

## 2020-09-13 NOTE — Assessment & Plan Note (Signed)
Initially elevated systolic but improved on recheck. With wide pulse pressure, age and h/o orthostasis, will continue current dosing. Continue to follow with cardiology.

## 2020-09-13 NOTE — Assessment & Plan Note (Signed)
Getting infusions, continue to follow with Heme/Onc.

## 2020-09-13 NOTE — Assessment & Plan Note (Signed)
Continue to follow with Oncology

## 2020-09-13 NOTE — Progress Notes (Signed)
    SUBJECTIVE:   CHIEF COMPLAINT / HPI:   Hypertension/CAD/CHF/PAF: - follows with Cardiology - Medications: amlodipine 5mg  daily, lisinopril 20mg , xarelto 15mg  - Compliance: good - Checking BP at home: yes, 160s SBP  - Denies any SOB, CP, vision changes, LE edema, medication SEs. - balance getting better with PT. - fell in January after missed step at PT.  HLD - medications: lipitor 20mg  - compliance: good - medication SEs: none  Hypothyroidism - Medications: Synthroid 57mcg MWFSu, 67mcg THSa. - Current symptoms:  none - Denies change in energy level, diarrhea, nervousness and palpitations - Symptoms have been well-controlled  Osteoporosis - on alendronate, vit D, calcium. Most recent DEXA 07/2018 with T score -2.8.  CLL/Anemia/B12 deficiency - getting B12 infusions and oral iron supplementation. Follows with Heme/Onc, next appt next month.  Spot on leg - present for a couple weeks. Unsure if hit on something. Was initially red and swollen but has improved. Has overlying scab. No current discharge or bleeding. No fevers. Has been putting neosporin on it.    OBJECTIVE:   BP (!) 170/62   Pulse 61   Temp 98 F (36.7 C) (Oral)   Resp 16   Ht 5\' 2"  (1.575 m)   Wt 141 lb (64 kg)   LMP  (LMP Unknown)   SpO2 98%   BMI 25.79 kg/m   Gen: elderly, well appearing, in NAD Card: RRR Lungs: CTAB Ext: WWP, no edema Skin: ~0.5cm area of erythema with overlying scab. No swelling, induration, fluctuance appreciated.   ASSESSMENT/PLAN:   Essential hypertension Initially elevated systolic but improved on recheck. With wide pulse pressure, age and h/o orthostasis, will continue current dosing. Continue to follow with cardiology.  Paroxysmal atrial fibrillation (HCC) In reg rhythm today. Continue xarelto and following with cardiology.  Hypothyroidism Rechecking labs today.  Osteoporosis Doing well on current regimen, no changes made today.  CLL (chronic lymphocytic  leukemia) ([place]) Continue to follow with Oncology.  B12 deficiency Getting infusions, continue to follow with Heme/Onc.   Spot on leg Likely initial break in skin with minor bleeding and irritation. Healing well. Recommend continued supportive care. F/u if worsens.  F/u in 6 months.  Myles Gip, DO

## 2020-09-13 NOTE — Patient Instructions (Addendum)
It was great to see you!  Our plans for today:  - We are checking some labs today, we will release these results to your MyChart. - You can continue to put neosporin on your leg if you like but I suspect it will continue to heal on its own.  - Come back in 6 months for your annual visit.   Take care and seek immediate care sooner if you develop any concerns.   Dr. Ky Barban  Things to do to keep yourself healthy  - Exercise at least 30-45 minutes a day, 3-4 days a week.  - Eat a low-fat diet with lots of fruits and vegetables, up to 7-9 servings per day.  - Seatbelts can save your life. Wear them always.  - Smoke detectors on every level of your home, check batteries every year.  - Eye Doctor - have an eye exam every 1-2 years  - Safe sex - if you may be exposed to STDs, use a condom.  - Alcohol -  If you drink, do it moderately, less than 2 drinks per day.  - [place]. Choose someone to speak for you if you are not able. https://www.prepareforyourcare.org is a great website to help you navigate this. - Depression is common in our stressful world.If you're feeling down or losing interest in things you normally enjoy, please come in for a visit.  - Violence - If anyone is threatening or hurting you, please call immediately.

## 2020-09-14 LAB — TSH: TSH: 5.82 mIU/L — ABNORMAL HIGH (ref 0.40–4.50)

## 2020-09-15 ENCOUNTER — Ambulatory Visit: Payer: Medicare Other

## 2020-09-20 ENCOUNTER — Ambulatory Visit: Payer: Medicare Other

## 2020-09-22 ENCOUNTER — Ambulatory Visit: Payer: Medicare Other

## 2020-09-26 ENCOUNTER — Emergency Department: Payer: Medicare Other

## 2020-09-26 ENCOUNTER — Other Ambulatory Visit: Payer: Self-pay

## 2020-09-26 ENCOUNTER — Emergency Department
Admission: EM | Admit: 2020-09-26 | Discharge: 2020-09-26 | Disposition: A | Discharge status: Home / Self Care | Payer: Medicare Other | Attending: Emergency Medicine | Admitting: Emergency Medicine

## 2020-09-26 DIAGNOSIS — R112 Nausea with vomiting, unspecified: Secondary | ICD-10-CM | POA: Diagnosis not present

## 2020-09-26 DIAGNOSIS — E039 Hypothyroidism, unspecified: Secondary | ICD-10-CM | POA: Diagnosis not present

## 2020-09-26 DIAGNOSIS — R404 Transient alteration of awareness: Secondary | ICD-10-CM | POA: Diagnosis not present

## 2020-09-26 DIAGNOSIS — D72829 Elevated white blood cell count, unspecified: Secondary | ICD-10-CM | POA: Insufficient documentation

## 2020-09-26 DIAGNOSIS — R55 Syncope and collapse: Secondary | ICD-10-CM

## 2020-09-26 DIAGNOSIS — R4020 Unspecified coma: Secondary | ICD-10-CM | POA: Diagnosis not present

## 2020-09-26 DIAGNOSIS — R197 Diarrhea, unspecified: Secondary | ICD-10-CM | POA: Diagnosis not present

## 2020-09-26 DIAGNOSIS — R1110 Vomiting, unspecified: Secondary | ICD-10-CM

## 2020-09-26 DIAGNOSIS — I10 Essential (primary) hypertension: Secondary | ICD-10-CM | POA: Diagnosis not present

## 2020-09-26 DIAGNOSIS — K8020 Calculus of gallbladder without cholecystitis without obstruction: Secondary | ICD-10-CM | POA: Diagnosis not present

## 2020-09-26 DIAGNOSIS — R064 Hyperventilation: Secondary | ICD-10-CM | POA: Diagnosis not present

## 2020-09-26 DIAGNOSIS — Z79899 Other long term (current) drug therapy: Secondary | ICD-10-CM | POA: Insufficient documentation

## 2020-09-26 DIAGNOSIS — R231 Pallor: Secondary | ICD-10-CM | POA: Diagnosis not present

## 2020-09-26 DIAGNOSIS — R0689 Other abnormalities of breathing: Secondary | ICD-10-CM | POA: Diagnosis not present

## 2020-09-26 LAB — CBC
HCT: 32.2 % — ABNORMAL LOW (ref 36.0–46.0)
Hemoglobin: 10.7 g/dL — ABNORMAL LOW (ref 12.0–15.0)
MCH: 30.6 pg (ref 26.0–34.0)
MCHC: 33.2 g/dL (ref 30.0–36.0)
MCV: 92 fL (ref 80.0–100.0)
Platelets: 196 10*3/uL (ref 150–400)
RBC: 3.5 MIL/uL — ABNORMAL LOW (ref 3.87–5.11)
RDW: 14.4 % (ref 11.5–15.5)
WBC: 14.7 10*3/uL — ABNORMAL HIGH (ref 4.0–10.5)
nRBC: 0 % (ref 0.0–0.2)

## 2020-09-26 LAB — URINALYSIS, COMPLETE (UACMP) WITH MICROSCOPIC
Bacteria, UA: NONE SEEN
Bilirubin Urine: NEGATIVE
Glucose, UA: NEGATIVE mg/dL
Hgb urine dipstick: NEGATIVE
Ketones, ur: NEGATIVE mg/dL
Leukocytes,Ua: NEGATIVE
Nitrite: NEGATIVE
Protein, ur: NEGATIVE mg/dL
Specific Gravity, Urine: 1.008 (ref 1.005–1.030)
Squamous Epithelial / HPF: NONE SEEN (ref 0–5)
pH: 6 (ref 5.0–8.0)

## 2020-09-26 LAB — COMPREHENSIVE METABOLIC PANEL
ALT: 18 U/L (ref 0–44)
AST: 29 U/L (ref 15–41)
Albumin: 3.9 g/dL (ref 3.5–5.0)
Alkaline Phosphatase: 26 U/L — ABNORMAL LOW (ref 38–126)
Anion gap: 9 (ref 5–15)
BUN: 19 mg/dL (ref 8–23)
CO2: 21 mmol/L — ABNORMAL LOW (ref 22–32)
Calcium: 9 mg/dL (ref 8.9–10.3)
Chloride: 105 mmol/L (ref 98–111)
Creatinine, Ser: 1.03 mg/dL — ABNORMAL HIGH (ref 0.44–1.00)
GFR, Estimated: 51 mL/min — ABNORMAL LOW (ref >60)
Glucose, Bld: 131 mg/dL — ABNORMAL HIGH (ref 70–99)
Potassium: 4.1 mmol/L (ref 3.5–5.1)
Sodium: 135 mmol/L (ref 135–145)
Total Bilirubin: 0.8 mg/dL (ref 0.3–1.2)
Total Protein: 7 g/dL (ref 6.5–8.1)

## 2020-09-26 LAB — LIPASE, BLOOD: Lipase: 43 U/L (ref 11–51)

## 2020-09-26 LAB — TROPONIN I (HIGH SENSITIVITY): Troponin I (High Sensitivity): 9 ng/L (ref <18)

## 2020-09-26 MED ORDER — ONDANSETRON HCL 4 MG/2ML IJ SOLN
4.0000 mg | Freq: Once | INTRAMUSCULAR | Status: AC
  Administered 2020-09-26: 4 mg via INTRAVENOUS
  Filled 2020-09-26: qty 2

## 2020-09-26 MED ORDER — SODIUM CHLORIDE 0.9 % IV BOLUS
1000.0000 mL | Freq: Once | INTRAVENOUS | Status: AC
  Administered 2020-09-26: 1000 mL via INTRAVENOUS

## 2020-09-26 NOTE — ED Notes (Signed)
First nurse note: pt comes EMS from home. Started not feeling well after eating lunch. Pt started having n/v/d. Pt was given zofran and 593ml bag NS. Family states hx of afib. CBG 128.

## 2020-09-26 NOTE — Discharge Instructions (Signed)
Please follow-up with your doctor.  Return to the emergency department for any further significant vomiting, diarrhea, abdominal pain or development of fever, or any other symptom personally concerning to yourself.  Please drink plenty of fluids obtain plenty of rest.

## 2020-09-26 NOTE — ED Provider Notes (Signed)
[facility] Emergency Department Provider Note  Time seen: 7:28 PM  I have reviewed the triage vital signs and the nursing notes.   HISTORY  Chief Complaint Emesis   HPI Sydney Mcdaniel is a 87 y.o. female with a past medical history of CLL, gastric reflux, hypertension, hyperlipidemia, paroxysmal atrial fibrillation presents to the emergency department for nausea vomiting diarrhea.  According to the patient and friend they had gone out to eat shortly after returning to their homes the patient called the friend and told her that she is not feeling well.  Friend came over and found the patient on the toilet she had vomited and had diarrhea and was very weak appearing nearly unconscious per friend.  Patient states after she had a bowel movement and vomited she felt much better.  Patient states this is happened before with "food poisoning."  Patient denies any abdominal pain or chest pain.  Denies any fever.  Denies any recent illnesses.  Patient states currently she feels normal.  Past Medical History:  Diagnosis Date  . Allergy   . Anemia   . Cataract   . CLL (chronic lymphocytic leukemia) ([place])   . GERD (gastroesophageal reflux disease)   . Glaucoma   . H/O: hysterectomy    Total  . Hyperlipidemia   . Hypertension   . Hypothyroidism   . Impaired fasting glucose   . Lichen sclerosus   . Osteoporosis    Hips  . PAF (paroxysmal atrial fibrillation) ([place])    a. 06/15/2016 Event monitor: 4% afib burden; b. CHA2DS2VASc - 4-->Xarelto.  . Syncope    a. 03/2016 Echo: EF55-60%, no rwma, mild AI/MR, nl PASP; b. 03/2016 48h Holter: no significant arrhythmias/pauses; c. 03/2016 MV: mild apical defect, likely breast attenuation, nl EF, low risk; d. 05/2016 Event monitor: No significant arrhythmia; e. 05/2016 Event monitor: PAF (4%).    Patient Active Problem List   Diagnosis Date Noted  . Normocytic anemia 05/31/2020  . Hematochezia 04/11/2020  . Acute colitis  04/11/2020  . GI bleeding 04/11/2020  . Iron deficiency anemia 03/08/2020  . Goals of care, counseling/discussion 03/08/2020  . 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 Sinus bradycardia 04/09/2019  . First degree AV block 04/09/2019  . Postural dizziness with presyncope 02/25/2019  . Osteoporosis 08/21/2018  . Osteoarthritis of left hip 05/21/2018  . Bilateral carotid artery stenosis 04/19/2018  . History of syncope 10/17/2017  . MCI (mild cognitive impairment) 08/12/2017  . Lung nodules 07/09/2017  . Paroxysmal atrial fibrillation ([place]) 04/18/2017  . Pleural effusion 10/20/2016  . Hyponatremia 10/20/2016  . Chronic diastolic congestive heart failure ([place]) 06/28/2016  . Primary open-angle glaucoma, bilateral, mild stage 05/08/2016  . Labile blood glucose 04/27/2016  . Bladder prolapse, female, acquired 02/25/2016  . 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  . B12 deficiency 01/11/2016  . Anemia 01/10/2016  . CLL (chronic lymphocytic leukemia) ([place]) 01/10/2016  . 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 12/21/2015  . Bradycardia 12/21/2015  . Lymphocytosis 06/25/2015  . Impaired fasting glucose 06/22/2015  . Carotid atherosclerosis 06/22/2015  . Essential hypertension 12/22/2014  . Hyperlipidemia 12/22/2014  . Hypothyroidism 12/22/2014    Past Surgical History:  Procedure Laterality Date  . APPENDECTOMY    . EYE SURGERY     Glaucoma  . TOTAL ABDOMINAL HYSTERECTOMY      Prior to Admission medications   Medication Sig Start Date End Date Taking? Authorizing  Provider  alendronate (FOSAMAX) 70 MG tablet TAKE ONE TABLET BY MOUTH EVERY 7 DAYS. TAKE WITH A FULL GLASS OF WATER ON AN EMPTY STOMACH. Patient taking differently: Take 70 mg by mouth every Friday. TAKE ONE TABLET BY MOUTH EVERY 7 DAYS. TAKE WITH A FULL GLASS OF WATER ON AN EMPTY STOMACH. 06/08/20   Delsa Grana, PA-C  amLODipine (NORVASC) 5 MG tablet  Take 1 tablet (5 mg total) by mouth daily. 05/19/20   Loel Dubonnet, NP  atorvastatin (LIPITOR) 20 MG tablet Take 1 tablet (20 mg total) by mouth at bedtime. 04/08/20   Delsa Grana, PA-C  Calcium Carb-Cholecalciferol (CALCIUM 600 + D PO) Take 1 tablet by mouth daily.     [provider]  cholecalciferol (VITAMIN D) 1000 units tablet Take 1,000 Units by mouth daily.    [provider]  Cyanocobalamin (B-12) 1000 MCG/ML KIT Inject 1,000 mcg as directed every 30 (thirty) days.    [provider]  famotidine (PEPCID) 20 MG tablet Take 20 mg by mouth daily.    [provider]  ferrous sulfate 325 (65 FE) MG tablet Take 325 mg by mouth 2 (two) times daily with a meal.    [provider]  latanoprost (XALATAN) 0.005 % ophthalmic solution Place 1 drop into the right eye at bedtime.  04/24/15   [provider]  levothyroxine (EUTHYROX) 75 MCG tablet TAKE ONE TABLET BY MOUTH ON MONDAYS, [place], [place], [place] SUNDAYS Patient taking differently: Take 75 mcg by mouth as directed. TAKE ONE TABLET BY MOUTH ON MONDAYS, WEDNESDAYS, FRIDAYS, AND SUNDAYS 04/08/20   Delsa Grana, PA-C  levothyroxine (EUTHYROX) 88 MCG tablet TAKE 1 TABLET BY MOUTH ON TUESDAYS, THURSDAYS, AND SATURDAYS Patient taking differently: Take 88 mcg by mouth as directed. TAKE 1 TABLET BY MOUTH ON TUESDAYS, THURSDAYS, AND SATURDAYS 04/08/20   Delsa Grana, PA-C  lisinopril (ZESTRIL) 20 MG tablet Take 1 tablet (20 mg total) by mouth daily. 05/19/20   Loel Dubonnet, NP  Multiple Vitamins-Minerals (MULTIVITAMIN ADULT PO) Take by mouth.    [provider]  Omega-3 Fatty Acids (FISH OIL) 1200 MG CAPS Take 1,200 mg by mouth daily.     [provider]  Rivaroxaban (XARELTO) 15 MG TABS tablet Take 1 tablet (15 mg total) by mouth daily. 06/28/20   Loel Dubonnet, NP  timolol (TIMOPTIC) 0.5 % ophthalmic solution Place 1 drop into the right eye 2 (two) times daily.  01/01/20    [provider]    Allergies  Allergen Reactions  . Other   . Alphagan [Brimonidine] Itching and Rash  . Pravachol [Pravastatin Sodium] Itching and Rash  . Pravastatin Itching, Other (See Comments) and Rash    Family History  Problem Relation Age of Onset  . Heart attack Mother   . Glaucoma Mother   . Heart attack Father   . Parkinson's disease Brother   . Heart attack Brother     Social History Social History   Tobacco Use  . Smoking status: Never Smoker  . Smokeless tobacco: Never Used  Vaping Use  . Vaping Use: Never used  Substance Use Topics  . Alcohol use: No  . Drug use: No    Review of Systems Constitutional: Negative for fever.  Near syncope. Cardiovascular: Negative for chest pain. Respiratory: Negative for shortness of breath. Gastrointestinal: Negative for abdominal pain.  Positive for nausea vomiting diarrhea around 2:30 PM now resolved. Genitourinary: Negative for urinary compaints Musculoskeletal: Negative for musculoskeletal complaints Neurological:  Negative for headache All other ROS negative  ____________________________________________   PHYSICAL EXAM:  VITAL SIGNS: ED Triage Vitals [09/26/20 1629]  Enc Vitals Group     BP (!) 141/70     Pulse Rate 71     Resp 18     Temp 98.1 F (36.7 C)     Temp Source Oral     SpO2 100 %     Weight 140 lb (63.5 kg)     Height 5' 2" (1.575 m)     Head Circumference      Peak Flow      Pain Score 0     Pain Loc      Pain Edu?      Excl. in [place]?     Constitutional: Alert and oriented. Well appearing and in no distress. Eyes: Normal exam ENT      Head: Normocephalic and atraumatic.      Mouth/Throat: Mucous membranes are moist. Cardiovascular: Normal rate, regular rhythm.  Respiratory: Normal respiratory effort without tachypnea nor retractions. Breath sounds are clear  Gastrointestinal: Soft and nontender. No distention.  Musculoskeletal: Nontender with normal range of motion in  all extremities.  Neurologic:  Normal speech and language. No gross focal neurologic deficits  Skin:  Skin is warm, dry and intact.  Psychiatric: Mood and affect are normal.  ____________________________________________    EKG  EKG viewed and interpreted by myself shows a normal sinus rhythm at 72 bpm with a widened QRS, normal axis, slight QTC prolongation otherwise normal intervals with nonspecific ST changes.  ____________________________________________    RADIOLOGY  X-rays negative  ____________________________________________   INITIAL IMPRESSION / ASSESSMENT AND PLAN / ED COURSE  Pertinent labs & imaging results that were available during my care of the patient were reviewed by me and considered in my medical decision making (see chart for details).   Patient presents to the emergency department after nausea vomiting diarrhea and a near syncopal episode.  Currently the patient appears well, no distress denies any abdominal pain is a benign abdominal exam.  Denies any recent illnesses.  Patient states this is happened previously when she had "food poisoning."  Patient's lab work is reassuring besides a mild leukocytosis.  Reassuringly patient has benign abdominal exam we will obtain an x-ray as a precaution to rule out obstructive pathology we will treat with IV fluids Zofran and continue to closely monitor.  I have added on a troponin, we will obtain a urine as a precaution as well.  Lab work is reassuring.  Troponin, lipase, LFTs are normal.  Patient continues to feel well in the emergency department denies any abdominal pain denies any further nausea.  No diarrhea.  Patient states she feels ready to go home.  We will discharge the patient home with continued supportive care at home including fluids and plenty of rest.  Discussed return precautions.  Patient agreeable to plan.   Sydney Mcdaniel was evaluated in Emergency Department on 09/26/2020 for the symptoms described in the  history of present illness. She was evaluated in the context of the global COVID-19 pandemic, which necessitated consideration that the patient might be at risk for infection with the SARS-CoV-2 virus that causes COVID-19. Institutional protocols and algorithms that pertain to the evaluation of patients at risk for COVID-19 are in a state of rapid change based on information released by regulatory bodies including the CDC and federal and state organizations. These policies and algorithms were followed during the patient's care in the  ED.  ____________________________________________   FINAL CLINICAL IMPRESSION(S) / ED DIAGNOSES  Nausea vomiting diarrhea Near syncope   Harvest Dark, MD 09/26/20 2249

## 2020-09-26 NOTE — ED Triage Notes (Signed)
Pt started having n/v/d after eating [place] and dressing. Pt stated abd pain then was relieved by BM. Pt in NAD now. States she only came because her daughter made her.

## 2020-09-27 ENCOUNTER — Inpatient Hospital Stay: Payer: Medicare Other | Attending: Hematology and Oncology

## 2020-09-27 VITALS — BP 163/71 | HR 63

## 2020-09-27 DIAGNOSIS — R918 Other nonspecific abnormal finding of lung field: Secondary | ICD-10-CM | POA: Diagnosis not present

## 2020-09-27 DIAGNOSIS — E538 Deficiency of other specified B group vitamins: Secondary | ICD-10-CM | POA: Insufficient documentation

## 2020-09-27 DIAGNOSIS — E871 Hypo-osmolality and hyponatremia: Secondary | ICD-10-CM | POA: Diagnosis not present

## 2020-09-27 DIAGNOSIS — R197 Diarrhea, unspecified: Secondary | ICD-10-CM | POA: Insufficient documentation

## 2020-09-27 DIAGNOSIS — R112 Nausea with vomiting, unspecified: Secondary | ICD-10-CM | POA: Diagnosis not present

## 2020-09-27 DIAGNOSIS — E039 Hypothyroidism, unspecified: Secondary | ICD-10-CM | POA: Insufficient documentation

## 2020-09-27 DIAGNOSIS — D649 Anemia, unspecified: Secondary | ICD-10-CM | POA: Diagnosis not present

## 2020-09-27 DIAGNOSIS — C9110 Chronic lymphocytic leukemia of B-cell type not having achieved remission: Secondary | ICD-10-CM | POA: Insufficient documentation

## 2020-09-27 MED ORDER — CYANOCOBALAMIN 1000 MCG/ML IJ SOLN
1000.0000 ug | Freq: Once | INTRAMUSCULAR | Status: AC
  Administered 2020-09-27: 1000 ug via INTRAMUSCULAR
  Filled 2020-09-27: qty 1

## 2020-09-27 NOTE — Progress Notes (Signed)
Patient tolerated vitamin B injection well, no concerns voiced. Patient discharged. Stable.

## 2020-09-30 ENCOUNTER — Ambulatory Visit: Payer: Self-pay

## 2020-09-30 NOTE — Telephone Encounter (Signed)
Patient's daughter Sydney Mcdaniel on [place] called and says the patient has not had a BM since Sunday when she had diarrhea and vomiting. She says the patient passed out on the toilet, EMS was called, she was given fluids en route to the ED. She came around before ED came. The ED provider says she was dehydrated. She says the patient was on the toilet trying to have a BM on Monday and yesterday and wiped blood on the tissue just once both days. She did not have a BM. She says the patient is not having any other symptoms, no abdominal pain, no abdominal swelling. I advised no availability. I called the office and spoke to [place], [facility] about the patient. She placed me on hold and says there is a cancellation for tomorrow at 1040 with Kristeen Miss, I advised the daughter and she accepted the appointment. Cassandra, FC scheduled and advised to be there at 1025. I advised Sydney Mcdaniel of the arrival time, care advice given, daughter verbalized understanding.   Reason for Disposition . Last bowel movement (BM) > 4 days ago  Answer Assessment - Initial Assessment Questions 1. STOOL PATTERN OR FREQUENCY: "How often do you pass bowel movements (BMs)?"  (Normal range: tid to q 3 days)  "When was the last BM passed?"       Normally QOD; last BM was diarrhea on Sunday 2. STRAINING: "Do you have to strain to have a BM?"      Not usually 3. RECTAL PAIN: "Does your rectum hurt when the stool comes out?" If Yes, ask: "Do you have hemorrhoids? How bad is the pain?"  (Scale 1-10; or mild, moderate, severe)     Yesterday wiped a little blood x 1 and on Monday a little blood 4. STOOL COMPOSITION: "Are the stools hard?"      No 5. BLOOD ON STOOLS: "Has there been any blood on the toilet tissue or on the surface of the BM?" If Yes, ask: "When was the last time?"      No 6. CHRONIC CONSTIPATION: "Is this a new problem for you?"  If no, ask: "How long have you had this problem?" (days, weeks, months)      No 7. CHANGES IN DIET OR HYDRATION:  "Have there been any recent changes in your diet?" "How much fluids are you drinking consuming on a daily basis?"  "How much have you had to drink today?"     No changes to diet 8. MEDICATIONS: "Have you been taking any new medications?" "Are you taking any narcotic pain medications?" (e.g., Vicoden, Percocet, morphine, dilaudid)     No 9. LAXATIVES: "Have you been using any stool softeners, laxatives, or enemas?"  If yes, ask "What, how often, and when was the last time?"      No, prunes keeps her regular 10.ACTIVITY:  "How much walking do you do every day? on a daily basis?"  "Has your activity level decreased in the past week?"        Activity level is still the same 11. CAUSE: "What do you think is causing the constipation?"        Unknown 12. OTHER SYMPTOMS: "Do you have any other symptoms?" (e.g., abdominal pain, bloating, fever, vomiting)       No 13. MEDICAL HISTORY: "Do you have a history of hemorrhoids, rectal fissures, or rectal surgery or rectal abscess?"         Years ago had hemorrhoids 14. PREGNANCY: "Is there any chance you are pregnant?" "When was  your last menstrual period?"       No  Protocols used: CONSTIPATION-A-AH

## 2020-10-01 ENCOUNTER — Ambulatory Visit (INDEPENDENT_AMBULATORY_CARE_PROVIDER_SITE_OTHER): Payer: Medicare Other | Admitting: Family Medicine

## 2020-10-01 ENCOUNTER — Other Ambulatory Visit: Payer: Self-pay

## 2020-10-01 ENCOUNTER — Encounter: Payer: Self-pay | Admitting: Family Medicine

## 2020-10-01 VITALS — BP 126/74 | HR 83 | Temp 97.9°F | Resp 14 | Ht 62.0 in | Wt 147.1 lb

## 2020-10-01 DIAGNOSIS — K529 Noninfective gastroenteritis and colitis, unspecified: Secondary | ICD-10-CM

## 2020-10-01 DIAGNOSIS — D72829 Elevated white blood cell count, unspecified: Secondary | ICD-10-CM

## 2020-10-01 DIAGNOSIS — D649 Anemia, unspecified: Secondary | ICD-10-CM | POA: Diagnosis not present

## 2020-10-01 DIAGNOSIS — Z09 Encounter for follow-up examination after completed treatment for conditions other than malignant neoplasm: Secondary | ICD-10-CM | POA: Diagnosis not present

## 2020-10-01 DIAGNOSIS — R197 Diarrhea, unspecified: Secondary | ICD-10-CM | POA: Diagnosis not present

## 2020-10-01 NOTE — Progress Notes (Signed)
Patient ID: Sydney Mcdaniel, female    DOB: 12-Aug-1924, 87 y.o.   MRN: [id number]  PCP: Delsa Grana, PA-C  Chief Complaint  Patient presents with  . Constipation    Went to ER and now has diarrhea    Subjective:   Sydney Mcdaniel is a 87 y.o. female, presents to clinic with CC of the following:  HPI  Pt went to the ER on 09/26/20 for N/V/D Reviewed HPI from ED: Sydney Mcdaniel is a 87 y.o. female with a past medical history of CLL, gastric reflux, hypertension, hyperlipidemia, paroxysmal atrial fibrillation presents to the emergency department for nausea vomiting diarrhea.  According to the patient and friend they had gone out to eat shortly after returning to their homes the patient called the friend and told her that she is not feeling well.  Friend came over and found the patient on the toilet she had vomited and had diarrhea and was very weak appearing nearly unconscious per friend.  Patient states after she had a bowel movement and vomited she felt much better.  Patient states this is happened before with "food poisoning."  Patient denies any abdominal pain or chest pain.  Denies any fever.  Denies any recent illnesses.  Patient states currently she feels normal.  Pt presents here for f/up complaining of diarrhea - she did have diarrhea 5 d ago and it has continued but N/V has improving, weight up Prior hx of hematochezia, acute colitis, anemia  She has been eating and drinking normally good appetite, no abdominal pain, bloating, she did have small BM this am but not for the few days before that since having diarrhea in the ED.  No melena, hematochezia, rectal pain, tenesmus, cramping, indigestion, nausea.  She is eating drinking and having normal urination.  She overall feels pretty good especially at first in the morning but sometimes still in the afternoon does not quite feel like herself or feels a little bit mildly rundown tired.  They are concerned about a little bit of increase  in her weight, in the ER after having sudden and repeated vomiting and diarrhea her weight was 140, today in clinic she is 147 and at home she has been 1 43-1 44, they have been told to watch for sudden weight changes with her history of heart failure but she denies any lower extremity edema, exertional dyspnea, palpitations, chest pain, orthopnea, PND.  She denies any near syncope.    Prior to onset of her symptoms they all went out to eat after church everyone ate the same foods except for Sydney Mcdaniel had the potato salad and was the only one that had sudden onset of nausea vomiting diarrhea.  This is happened one other time with similar circumstances eating out after church and again yellowing affected.  At that time she did have a focal sigmoid and rectal colitis and she was admitted to the hospital at that time.   Reviewed ER visit documentation, imaging results, urine and blood results Lipase was normal serum creatinine mildly increased from last labs in GFR decreased slightly but no AKI, LFTs were normal, no electrolyte derangement, her anemia was stable relative to her baseline and she had mild leukocytosis, cardiac enzymes and EKG were unremarkable -abdominal x-ray was done and also unremarkable except for a known cholelithiasis:  Narrative & Impression  CLINICAL DATA:  Nausea, vomiting, diarrhea  EXAM: ABDOMEN - 2 VIEW  COMPARISON:  CT 04/11/2020  FINDINGS: Gallstones seen in the  right abdomen as seen on prior CT. No organomegaly or free air. No bowel obstruction. Visualized lungs clear. No effusions. No acute bony abnormality.  IMPRESSION: Cholelithiasis.  No acute findings.   Electronically Signed   By: Rolm Baptise M.D.   On: 09/26/2020 19:37    Patient further denies fever chills sweats rash, sick contacts, jaundice, back pain, chest pain, urinary changes  Patient Active Problem List   Diagnosis Date Noted  . Normocytic anemia 05/31/2020  . Hematochezia  04/11/2020  . Acute colitis 04/11/2020  . GI bleeding 04/11/2020  . Iron deficiency anemia 03/08/2020  . Goals of care, counseling/discussion 03/08/2020  . 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 Sinus bradycardia 04/09/2019  . First degree AV block 04/09/2019  . Postural dizziness with presyncope 02/25/2019  . Osteoporosis 08/21/2018  . Osteoarthritis of left hip 05/21/2018  . Bilateral carotid artery stenosis 04/19/2018  . History of syncope 10/17/2017  . MCI (mild cognitive impairment) 08/12/2017  . Lung nodules 07/09/2017  . Paroxysmal atrial fibrillation ([place]) 04/18/2017  . Pleural effusion 10/20/2016  . Hyponatremia 10/20/2016  . Chronic diastolic congestive heart failure ([place]) 06/28/2016  . Primary open-angle glaucoma, bilateral, mild stage 05/08/2016  . Labile blood glucose 04/27/2016  . Bladder prolapse, female, acquired 02/25/2016  . 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  . B12 deficiency 01/11/2016  . Anemia 01/10/2016  . CLL (chronic lymphocytic leukemia) ([place]) 01/10/2016  . 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 12/21/2015  . Bradycardia 12/21/2015  . Lymphocytosis 06/25/2015  . Impaired fasting glucose 06/22/2015  . Carotid atherosclerosis 06/22/2015  . Essential hypertension 12/22/2014  . Hyperlipidemia 12/22/2014  . Hypothyroidism 12/22/2014      Current Outpatient Medications:  .  alendronate (FOSAMAX) 70 MG tablet, TAKE ONE TABLET BY MOUTH EVERY 7 DAYS. TAKE WITH A FULL GLASS OF WATER ON AN EMPTY STOMACH. (Patient taking differently: Take 70 mg by mouth every Friday. TAKE ONE TABLET BY MOUTH EVERY 7 DAYS. TAKE WITH A FULL GLASS OF WATER ON AN EMPTY STOMACH.), Disp: 12 tablet, Rfl: 3 .  amLODipine (NORVASC) 5 MG tablet, Take 1 tablet (5 mg total) by mouth daily., Disp: 90 tablet, Rfl: 1 .  atorvastatin (LIPITOR) 20 MG tablet, Take 1 tablet (20 mg total) by mouth at bedtime., Disp: 90  tablet, Rfl: 3 .  Calcium Carb-Cholecalciferol (CALCIUM 600 + D PO), Take 1 tablet by mouth daily. , Disp: , Rfl:  .  cholecalciferol (VITAMIN D) 1000 units tablet, Take 1,000 Units by mouth daily., Disp: , Rfl:  .  Cyanocobalamin (B-12) 1000 MCG/ML KIT, Inject 1,000 mcg as directed every 30 (thirty) days., Disp: , Rfl:  .  famotidine (PEPCID) 20 MG tablet, Take 20 mg by mouth daily., Disp: , Rfl:  .  ferrous sulfate 325 (65 FE) MG tablet, Take 325 mg by mouth 2 (two) times daily with a meal., Disp: , Rfl:  .  latanoprost (XALATAN) 0.005 % ophthalmic solution, Place 1 drop into the right eye at bedtime. , Disp: , Rfl:  .  levothyroxine (EUTHYROX) 75 MCG tablet, TAKE ONE TABLET BY MOUTH ON MONDAYS, WEDNESDAYS, FRIDAYS, AND SUNDAYS (Patient taking differently: Take 75 mcg by mouth as directed. TAKE ONE TABLET BY MOUTH ON MONDAYS, WEDNESDAYS, FRIDAYS, AND SUNDAYS), Disp: 52 tablet, Rfl: 2 .  levothyroxine (EUTHYROX) 88 MCG tablet, TAKE 1 TABLET BY MOUTH ON TUESDAYS, THURSDAYS, AND SATURDAYS (Patient taking differently: Take 88 mcg by mouth as  directed. TAKE 1 TABLET BY MOUTH ON TUESDAYS, THURSDAYS, AND SATURDAYS), Disp: 39 tablet, Rfl: 2 .  lisinopril (ZESTRIL) 20 MG tablet, Take 1 tablet (20 mg total) by mouth daily., Disp: 90 tablet, Rfl: 1 .  Multiple Vitamins-Minerals (MULTIVITAMIN ADULT PO), Take by mouth., Disp: , Rfl:  .  Omega-3 Fatty Acids (FISH OIL) 1200 MG CAPS, Take 1,200 mg by mouth daily. , Disp: , Rfl:  .  polyethylene glycol (MIRALAX / GLYCOLAX) 17 g packet, Take 17 g by mouth daily., Disp: , Rfl:  .  Rivaroxaban (XARELTO) 15 MG TABS tablet, Take 1 tablet (15 mg total) by mouth daily., Disp: 90 tablet, Rfl: 2 .  timolol (TIMOPTIC) 0.5 % ophthalmic solution, Place 1 drop into the right eye 2 (two) times daily. , Disp: , Rfl:    Allergies  Allergen Reactions  . Other   . Alphagan [Brimonidine] Itching and Rash  . Pravachol [Pravastatin Sodium] Itching and Rash  . Pravastatin Itching,  Other (See Comments) and Rash     Social History   Tobacco Use  . Smoking status: Never Smoker  . Smokeless tobacco: Never Used  Vaping Use  . Vaping Use: Never used  Substance Use Topics  . Alcohol use: No  . Drug use: No      Chart Review Today: I personally reviewed active problem list, medication list, allergies, family history, social history, health maintenance, notes from last encounter, lab results, imaging with the patient/caregiver today. 10+ min on chart review today personally reviewing ER records labs and imaging results and images also her prior admission for similar symptoms 6 months ago reviewed admission CT results, discharge summary meds and reviewed labs over the last 6 months.  Review of Systems  Constitutional: Negative.  Negative for appetite change, chills, diaphoresis, fatigue, fever and unexpected weight change.  HENT: Negative.   Eyes: Negative.   Respiratory: Negative.  Negative for apnea, cough, choking, shortness of breath, wheezing and stridor.   Cardiovascular: Negative.  Negative for chest pain, palpitations and leg swelling.  Gastrointestinal: Negative.  Negative for abdominal distention, abdominal pain, anal bleeding, blood in stool, nausea, rectal pain and vomiting.  Endocrine: Negative.   Genitourinary: Negative.  Negative for difficulty urinating, dyspareunia, dysuria, flank pain, frequency, hematuria, menstrual problem, pelvic pain, urgency and vaginal pain.  Musculoskeletal: Negative.  Negative for arthralgias and myalgias.  Skin: Negative.  Negative for color change, pallor and rash.  Allergic/Immunologic: Negative.   Neurological: Negative.  Negative for dizziness, facial asymmetry and light-headedness.  Hematological: Negative.   Psychiatric/Behavioral: Negative.  Negative for agitation, behavioral problems, confusion, decreased concentration, dysphoric mood and sleep disturbance.  All other systems reviewed and are negative.       Objective:   Vitals:   10/01/20 1039  BP: 126/74  Pulse: 83  Resp: 14  Temp: 97.9 F (36.6 C)  SpO2: 99%  Weight: 147 lb 1.6 oz (66.7 kg)  Height: $Remove'5\' 2"'ijRAYPx$  (1.575 m)    Body mass index is 26.9 kg/m.  Physical Exam Vitals and nursing note reviewed.  Constitutional:      General: She is not in acute distress.    Appearance: Normal appearance. She is well-developed, well-groomed and overweight. She is not ill-appearing, toxic-appearing or diaphoretic.     Interventions: Face mask in place.     Comments: Well-appearing elderly female, appears stated age,  HENT:     Head: Normocephalic and atraumatic.     Right Ear: External ear normal.     Left Ear:  External ear normal.     Mouth/Throat:     Mouth: Mucous membranes are moist.     Pharynx: Oropharynx is clear.  Eyes:     General: Lids are normal. No scleral icterus.       Right eye: No discharge.        Left eye: No discharge.     Conjunctiva/sclera: Conjunctivae normal.  Neck:     Trachea: Phonation normal. No tracheal deviation.  Cardiovascular:     Rate and Rhythm: Normal rate and regular rhythm.     Pulses: Normal pulses.          Radial pulses are 2+ on the right side and 2+ on the left side.       Posterior tibial pulses are 2+ on the right side and 2+ on the left side.     Heart sounds: Normal heart sounds. No murmur heard. No friction rub. No gallop.   Pulmonary:     Effort: Pulmonary effort is normal. No respiratory distress.     Breath sounds: Normal breath sounds. No stridor. No wheezing, rhonchi or rales.  Chest:     Chest wall: No tenderness.  Abdominal:     General: Bowel sounds are normal. There is no distension.     Palpations: Abdomen is soft. There is no shifting dullness, fluid wave, hepatomegaly or pulsatile mass.     Tenderness: There is no abdominal tenderness. There is no right CVA tenderness, left CVA tenderness, guarding or rebound.     Comments: Protuberant obese and soft dimension, no  distention, normal bowel sounds x4, no tenderness to palpation no guarding no rebound, no CVA tenderness bilaterally  Musculoskeletal:     Right lower leg: No edema.     Left lower leg: No edema.  Skin:    General: Skin is warm and dry.     Capillary Refill: Capillary refill takes less than 2 seconds.     Coloration: Skin is not ashen, cyanotic, jaundiced, mottled, pale or sallow.     Findings: No rash.     Nails: There is no clubbing.  Neurological:     Mental Status: She is alert.     Motor: No abnormal muscle tone.     Comments: Walks with cane  Psychiatric:        Attention and Perception: Attention normal.        Mood and Affect: Mood normal.        Speech: Speech normal.        Behavior: Behavior normal. Behavior is cooperative.      Results for orders placed or performed during the hospital encounter of 09/26/20  CBC  Result Value Ref Range   WBC 14.7 (H) 4.0 - 10.5 K/uL   RBC 3.50 (L) 3.87 - 5.11 MIL/uL   Hemoglobin 10.7 (L) 12.0 - 15.0 g/dL   HCT 32.2 (L) 36.0 - 46.0 %   MCV 92.0 80.0 - 100.0 fL   MCH 30.6 26.0 - 34.0 pg   MCHC 33.2 30.0 - 36.0 g/dL   RDW 14.4 11.5 - 15.5 %   Platelets 196 150 - 400 K/uL   nRBC 0.0 0.0 - 0.2 %  Urinalysis, Complete w Microscopic Urine, Clean Catch  Result Value Ref Range   Color, Urine YELLOW (A) YELLOW   APPearance CLEAR (A) CLEAR   Specific Gravity, Urine 1.008 1.005 - 1.030   pH 6.0 5.0 - 8.0   Glucose, UA NEGATIVE NEGATIVE mg/dL   Hgb urine dipstick  NEGATIVE NEGATIVE   Bilirubin Urine NEGATIVE NEGATIVE   Ketones, ur NEGATIVE NEGATIVE mg/dL   Protein, ur NEGATIVE NEGATIVE mg/dL   Nitrite NEGATIVE NEGATIVE   Leukocytes,Ua NEGATIVE NEGATIVE   RBC / HPF 0-5 0 - 5 RBC/hpf   WBC, UA 0-5 0 - 5 WBC/hpf   Bacteria, UA NONE SEEN NONE SEEN   Squamous Epithelial / LPF NONE SEEN 0 - 5   Mucus PRESENT   Comprehensive metabolic panel  Result Value Ref Range   Sodium 135 135 - 145 mmol/L   Potassium 4.1 3.5 - 5.1 mmol/L    Chloride 105 98 - 111 mmol/L   CO2 21 (L) 22 - 32 mmol/L   Glucose, Bld 131 (H) 70 - 99 mg/dL   BUN 19 8 - 23 mg/dL   Creatinine, Ser 1.99 (H) 0.44 - 1.00 mg/dL   Calcium 9.0 8.9 - 14.4 mg/dL   Total Protein 7.0 6.5 - 8.1 g/dL   Albumin 3.9 3.5 - 5.0 g/dL   AST 29 15 - 41 U/L   ALT 18 0 - 44 U/L   Alkaline Phosphatase 26 (L) 38 - 126 U/L   Total Bilirubin 0.8 0.3 - 1.2 mg/dL   GFR, Estimated 51 (L) >60 mL/min   Anion gap 9 5 - 15  Lipase, blood  Result Value Ref Range   Lipase 43 11 - 51 U/L  Troponin I (High Sensitivity)  Result Value Ref Range   Troponin I (High Sensitivity) 9 <18 ng/L       Assessment & Plan:   1. Diarrhea, unspecified type Patient presented to the ER 5 days ago with nausea vomiting and diarrhea she was able to be discharged from the ER and since returning home has had good appetite is able to eat and drink however she has not had bowels returned to normal.  She denies abdominal pain, melena, hematochezia, nausea, she has unremarkable abdominal exam today, vital signs stable.  I do think that her change in bowels is appropriate given her symptoms a few days ago and reassured her that it may take a little time for her bowels to return to normal she should continue to eat a well-balanced diet drink plenty of clear fluids, eat foods high in fiber and she can continue to take a dose of MiraLAX daily to ensure that stools are soft and nontraumatic.  Will recheck labs to see if leukocytosis improved to ensure no change to her baseline anemia and to see if her mild dehydration improves.  Her weight has improved which does not seem concerning at all for any heart failure and she appears euvolemic today I do think that the increase in weight was gaining back fluids lost with gastroenteritis, and she has no signs or symptoms of CHF - COMPLETE METABOLIC PANEL WITH GFR - CBC with Differential/Platelet  2. Normocytic anemia Labs reviewed over the past 6 months baseline anemia  stable even when presented to the ER recheck today - CBC with Differential/Platelet  3. Leukocytosis, unspecified type -was mild, recheck - CBC with Differential/Platelet  4. Gastroenteritis Handout given, with no pain and improved appetite I doubt that patient has colitis again or that she needs any antibiotics encouraged her to continue to progress diet back to normal, would be understandable for her to still feel little rundown and not back to her complete normal but would expect her to gradually feel better over the next week - COMPLETE METABOLIC PANEL WITH GFR - CBC with Differential/Platelet  5.  Encounter for examination following treatment at hospital - COMPLETE METABOLIC PANEL WITH GFR - CBC with Differential/Platelet   Patient was examined and plan and return precautions were reviewed with the patient and her daughter and examined today All questions asked and answered Labs x-ray imaging results from recent ER visit and prior hospital visit were reviewed with patient and her daughter today Here precautions and regular office follow-up precautions were reviewed Expect her to have gradual return to normal bowels over the next 1 to 2 weeks Can decrease miralax dosing as needed - titrate to one soft BM daily     Delsa Grana, PA-C 10/01/20 10:52 AM

## 2020-10-01 NOTE — Patient Instructions (Signed)
Continue one cap of miralax once daily to help keep stools soft and hope that her bowels should be moving and stool should be softer  If stool is too watery you can decrease the amount of miralax to 1/2 a cap or do every other day   Viral Gastroenteritis, Adult  Viral gastroenteritis is also known as the stomach flu. This condition may affect your stomach, your small intestine, and your large intestine. It can cause sudden watery poop (diarrhea), fever, and throwing up (vomiting). This condition is caused by certain germs (viruses). These germs can be passed from person to person very easily (are contagious). Having watery poop and throwing up can make you feel weak and cause you to not have enough water in your body (get dehydrated). This can make you tired and thirsty, make you have a dry mouth, and make it so you pee (urinate) less often. It is important to replace the fluids that you lose from having watery poop and throwing up. What are the causes?  You can get sick by catching viruses from other people.  You can also get sick by: ? Eating food, drinking water, or touching a surface that has the viruses on it (is contaminated). ? Sharing utensils or other personal items with a person who is sick. What increases the risk?  Having a weak body defense system (immune system).  Living with one or more children who are younger than 1 years old.  Living in a nursing home.  Going on cruise ships. What are the signs or symptoms? Symptoms of this condition start suddenly. Symptoms may last for a few days or for as long as a week.  Common symptoms include: ? Watery poop. ? Throwing up.  Other symptoms include: ? Fever. ? Headache. ? Feeling tired (fatigue). ? Pain in the belly (abdomen). ? Chills. ? Feeling weak. ? Feeling sick to your stomach (nauseous). ? Muscle aches. ? Not feeling hungry. How is this treated?  This condition typically goes away on its own.  The focus of  treatment is to replace the fluids that you lose. This condition may be treated with: ? An ORS (oral rehydration solution). This is a drink that is sold at pharmacies and stores. ? Medicines to help with your symptoms. ? Probiotic supplements to reduce symptoms of diarrhea. ? Fluids given through an IV tube, if needed.  Older adults and people with other diseases or a weak body defense system are at higher risk for not having enough water in the body. Follow these instructions at home: Eating and drinking  Take an ORS as told by your doctor.  Drink clear fluids in small amounts as you are able. Clear fluids include: ? Water. ? Ice chips. ? Fruit juice with water added to it (diluted). ? Low-calorie sports drinks.  Drink enough fluid to keep your pee (urine) pale yellow.  Eat small amounts of healthy foods every 3-4 hours as you are able. This may include whole grains, fruits, vegetables, lean meats, and yogurt.  Avoid fluids that have a lot of sugar or caffeine in them, such as energy drinks, sports drinks, and soda.  Avoid spicy or fatty foods.  Avoid alcohol.   General instructions  Wash your hands often. This is very important after you have watery poop or you throw up. If you cannot use soap and water, use hand sanitizer.  Make sure that all people in your home wash their hands well and often.  Take over-the-counter and  prescription medicines only as told by your doctor.  Rest at home while you get better.  Watch your condition for any changes.  Take a warm bath to help with any burning or pain from having watery poop.  Keep all follow-up visits as told by your doctor. This is important.   Contact a doctor if:  You cannot keep fluids down.  Your symptoms get worse.  You have new symptoms.  You feel light-headed.  You feel dizzy.  You have muscle cramps. Get help right away if:  You have chest pain.  You feel very weak.  You pass out (faint).  You see  blood in your throw-up.  Your throw-up looks like coffee grounds.  You have bloody or black poop (stools) or poop that looks like tar.  You have a very bad headache, or a stiff neck, or both.  You have a rash.  You have very bad pain, cramping, or bloating in your belly.  You have trouble breathing.  You are breathing very quickly.  You have a fast heartbeat.  Your skin feels cold and clammy.  You feel mixed up (confused).  You have pain when you pee.  You have signs of not having enough water in the body, such as: ? Dark pee, hardly any pee, or no pee. ? Cracked lips. ? Dry mouth. ? Sunken eyes. ? Feeling very sleepy. ? Feeling weak. Summary  Viral gastroenteritis is also known as the stomach flu.  This condition can cause sudden watery poop (diarrhea), fever, and throwing up (vomiting).  These germs can be passed from person to person very easily.  Take an ORS as told by your doctor. This is a drink that is sold at pharmacies and stores.  Drink fluids in small amounts many times each day as you are able. This information is not intended to replace advice given to you by your health care provider. Make sure you discuss any questions you have with your health care provider. Document Revised: 04/17/2018 Document Reviewed: 04/17/2018 Elsevier Patient Education  2021 [place].   Constipation, Adult Constipation is when a person has trouble pooping (having a bowel movement). When you have this condition, you may poop fewer than 3 times a week. Your poop (stool) may also be dry, hard, or bigger than normal. Follow these instructions at home: Eating and drinking  Eat foods that have a lot of fiber, such as: ? Fresh fruits and vegetables. ? Whole grains. ? Beans.  Eat less of foods that are low in fiber and high in fat and sugar, such as: ? [place] fries. ? Hamburgers. ? Cookies. ? Candy. ? Soda.  Drink enough fluid to keep your pee (urine) pale yellow.    General instructions  Exercise regularly or as told by your doctor. Try to do 150 minutes of exercise each week.  Go to the restroom when you feel like you need to poop. Do not hold it in.  Take over-the-counter and prescription medicines only as told by your doctor. These include any fiber supplements.  When you poop: ? Do deep breathing while relaxing your lower belly (abdomen). ? Relax your pelvic floor. The pelvic floor is a group of muscles that support the rectum, bladder, and intestines (as well as the uterus in women).  Watch your condition for any changes. Tell your doctor if you notice any.  Keep all follow-up visits as told by your doctor. This is important. Contact a doctor if:  You have pain  that gets worse.  You have a fever.  You have not pooped for 4 days.  You vomit.  You are not hungry.  You lose weight.  You are bleeding from the opening of the butt (anus).  You have thin, pencil-like poop. Get help right away if:  You have a fever, and your symptoms suddenly get worse.  You leak poop or have blood in your poop.  Your belly feels hard or bigger than normal (bloated).  You have very bad belly pain.  You feel dizzy or you faint. Summary  Constipation is when a person poops fewer than 3 times a week, has trouble pooping, or has poop that is dry, hard, or bigger than normal.  Eat foods that have a lot of fiber.  Drink enough fluid to keep your pee (urine) pale yellow.  Take over-the-counter and prescription medicines only as told by your doctor. These include any fiber supplements. This information is not intended to replace advice given to you by your health care provider. Make sure you discuss any questions you have with your health care provider. Document Revised: 04/30/2019 Document Reviewed: 04/30/2019 Elsevier Patient Education  [place].

## 2020-10-02 LAB — COMPLETE METABOLIC PANEL WITH GFR
AG Ratio: 1.4 (calc) (ref 1.0–2.5)
ALT: 17 U/L (ref 6–29)
AST: 19 U/L (ref 10–35)
Albumin: 4.1 g/dL (ref 3.6–5.1)
Alkaline phosphatase (APISO): 27 U/L — ABNORMAL LOW (ref 37–153)
BUN: 12 mg/dL (ref 7–25)
CO2: 26 mmol/L (ref 20–32)
Calcium: 9.5 mg/dL (ref 8.6–10.4)
Chloride: 99 mmol/L (ref 98–110)
Creat: 0.79 mg/dL (ref 0.60–0.88)
GFR, Est African American: 75 mL/min/{1.73_m2} (ref >=60)
GFR, Est Non African American: 65 mL/min/{1.73_m2} (ref >=60)
Globulin: 2.9 g/dL (calc) (ref 1.9–3.7)
Glucose, Bld: 103 mg/dL — ABNORMAL HIGH (ref 65–99)
Potassium: 4.6 mmol/L (ref 3.5–5.3)
Sodium: 134 mmol/L — ABNORMAL LOW (ref 135–146)
Total Bilirubin: 0.5 mg/dL (ref 0.2–1.2)
Total Protein: 7 g/dL (ref 6.1–8.1)

## 2020-10-02 LAB — CBC WITH DIFFERENTIAL/PLATELET
Absolute Monocytes: 623 cells/uL (ref 200–950)
Basophils Absolute: 19 cells/uL (ref 0–200)
Basophils Relative: 0.2 %
Eosinophils Absolute: 84 cells/uL (ref 15–500)
Eosinophils Relative: 0.9 %
HCT: 31.8 % — ABNORMAL LOW (ref 35.0–45.0)
Hemoglobin: 10.7 g/dL — ABNORMAL LOW (ref 11.7–15.5)
Lymphs Abs: 5394 cells/uL — ABNORMAL HIGH (ref 850–3900)
MCH: 31.1 pg (ref 27.0–33.0)
MCHC: 33.6 g/dL (ref 32.0–36.0)
MCV: 92.4 fL (ref 80.0–100.0)
MPV: 9.2 fL (ref 7.5–12.5)
Monocytes Relative: 6.7 %
Neutro Abs: 3181 cells/uL (ref 1500–7800)
Neutrophils Relative %: 34.2 %
Platelets: 225 10*3/uL (ref 140–400)
RBC: 3.44 10*6/uL — ABNORMAL LOW (ref 3.80–5.10)
RDW: 13.7 % (ref 11.0–15.0)
Total Lymphocyte: 58 %
WBC: 9.3 10*3/uL (ref 3.8–10.8)

## 2020-10-14 NOTE — Progress Notes (Signed)
[facility]  [address], [address] [place], [place] [address] Phone: [phone]  Fax: [phone]   Clinic Day:  10/14/2020  Referring physician: Delsa Grana, PA-C  Chief Complaint: Sydney Mcdaniel is a 87 y.o. female with chronic lymphocytic leukemia, B12 deficiency, and a normocytic anemia who is seen for 4 month assessment.  HPI: The patient was last seen in the hematology clinic on 05/31/2020. At that time, she felt "ok." She denied fevers, sweats,adenopathy, bruising, and bleeding. Exam revealed no adenopathy or hepatosplenomegaly. Hematocrit was 31.4, hemoglobin 10.4, MCV 93.2, platelets 205,000, WBC 9,200. Sodium was 134. Calcium was 8.8. Alkaline phosphatase was 25. Ferritin was 74 with an iron saturation of 24% and a TIBC of 293. Reticulocyte count was 1.7%. She continued oral iron BID. Discussed continued surveillance. She received a vitamin B12 injection.  She received vitamin B12 injections monthly x 4 (07/05/2020 - 09/27/2020).  During the interim, she has been doing well.  She lives at home alone.  Her daughter lives close by.  She had 1 episode of nausea, vomiting and diarrhea after a meal out.  Daughter is convinced it is related to dairy products or mayonnaise.  She was evaluated in the emergency room and received IV fluids and ultimately was discharged home.  She has intermittent confusion but is easily reminded.  Past Medical History:  Diagnosis Date  . Allergy   . Anemia   . Cataract   . CLL (chronic lymphocytic leukemia) ([place])   . GERD (gastroesophageal reflux disease)   . Glaucoma   . H/O: hysterectomy    Total  . Hyperlipidemia   . Hypertension   . Hypothyroidism   . Impaired fasting glucose   . Lichen sclerosus   . Osteoporosis    Hips  . PAF (paroxysmal atrial fibrillation) ([place])    a. 06/15/2016 Event monitor: 4% afib burden; b. CHA2DS2VASc - 4-->Xarelto.  . Syncope    a. 03/2016 Echo: EF55-60%, no rwma, mild AI/MR, nl  PASP; b. 03/2016 48h Holter: no significant arrhythmias/pauses; c. 03/2016 MV: mild apical defect, likely breast attenuation, nl EF, low risk; d. 05/2016 Event monitor: No significant arrhythmia; e. 05/2016 Event monitor: PAF (4%).    Past Surgical History:  Procedure Laterality Date  . APPENDECTOMY    . EYE SURGERY     Glaucoma  . TOTAL ABDOMINAL HYSTERECTOMY      Family History  Problem Relation Age of Onset  . Heart attack Mother   . Glaucoma Mother   . Heart attack Father   . Parkinson's disease Brother   . Heart attack Brother     Social History:  reports that she has never smoked. She has never used smokeless tobacco. She reports that she does not drink alcohol and does not use drugs.  Her husband died in 2014/12/29. She notes that she played basketball 75 years ago on a half court. She lives in [place].Her daughterisKaren. Her daughter can be reached at [phone] The patient is accompanied by her daughter today.  Allergies:  Allergies  Allergen Reactions  . Other   . Alphagan [Brimonidine] Itching and Rash  . Pravachol [Pravastatin Sodium] Itching and Rash  . Pravastatin Itching, Other (See Comments) and Rash    Current Medications: Current Outpatient Medications  Medication Sig Dispense Refill  . alendronate (FOSAMAX) 70 MG tablet TAKE ONE TABLET BY MOUTH EVERY 7 DAYS. TAKE WITH A FULL GLASS OF WATER ON AN EMPTY STOMACH. (Patient taking differently: Take 70 mg by mouth  every Friday. TAKE ONE TABLET BY MOUTH EVERY 7 DAYS. TAKE WITH A FULL GLASS OF WATER ON AN EMPTY STOMACH.) 12 tablet 3  . amLODipine (NORVASC) 5 MG tablet Take 1 tablet (5 mg total) by mouth daily. 90 tablet 1  . atorvastatin (LIPITOR) 20 MG tablet Take 1 tablet (20 mg total) by mouth at bedtime. 90 tablet 3  . Calcium Carb-Cholecalciferol (CALCIUM 600 + D PO) Take 1 tablet by mouth daily.     . cholecalciferol (VITAMIN D) 1000 units tablet Take 1,000 Units by mouth daily.    . Cyanocobalamin  (B-12) 1000 MCG/ML KIT Inject 1,000 mcg as directed every 30 (thirty) days.    . famotidine (PEPCID) 20 MG tablet Take 20 mg by mouth daily.    . ferrous sulfate 325 (65 FE) MG tablet Take 325 mg by mouth 2 (two) times daily with a meal.    . latanoprost (XALATAN) 0.005 % ophthalmic solution Place 1 drop into the right eye at bedtime.     Marland Kitchen levothyroxine (EUTHYROX) 75 MCG tablet TAKE ONE TABLET BY MOUTH ON MONDAYS, WEDNESDAYS, FRIDAYS, AND SUNDAYS (Patient taking differently: Take 75 mcg by mouth as directed. TAKE ONE TABLET BY MOUTH ON MONDAYS, WEDNESDAYS, FRIDAYS, AND SUNDAYS) 52 tablet 2  . levothyroxine (EUTHYROX) 88 MCG tablet TAKE 1 TABLET BY MOUTH ON TUESDAYS, THURSDAYS, AND SATURDAYS (Patient taking differently: Take 88 mcg by mouth as directed. TAKE 1 TABLET BY MOUTH ON TUESDAYS, THURSDAYS, AND SATURDAYS) 39 tablet 2  . lisinopril (ZESTRIL) 20 MG tablet Take 1 tablet (20 mg total) by mouth daily. 90 tablet 1  . Multiple Vitamins-Minerals (MULTIVITAMIN ADULT PO) Take by mouth.    . Omega-3 Fatty Acids (FISH OIL) 1200 MG CAPS Take 1,200 mg by mouth daily.     . polyethylene glycol (MIRALAX / GLYCOLAX) 17 g packet Take 17 g by mouth daily.    . Rivaroxaban (XARELTO) 15 MG TABS tablet Take 1 tablet (15 mg total) by mouth daily. 90 tablet 2  . timolol (TIMOPTIC) 0.5 % ophthalmic solution Place 1 drop into the right eye 2 (two) times daily.      No current facility-administered medications for this visit.    Review of Systems  Constitutional: Negative.  Negative for chills, fever, malaise/fatigue and weight loss.  HENT: Negative for congestion, ear pain and tinnitus.   Eyes: Negative.  Negative for blurred vision and double vision.  Respiratory: Negative.  Negative for cough, sputum production and shortness of breath.   Cardiovascular: Negative.  Negative for chest pain, palpitations and leg swelling.  Gastrointestinal: Positive for diarrhea, nausea and vomiting. Negative for abdominal pain  and constipation.  Genitourinary: Negative for dysuria, frequency and urgency.  Musculoskeletal: Negative for back pain and falls.  Skin: Negative.  Negative for rash.  Neurological: Negative.  Negative for weakness and headaches.  Endo/Heme/Allergies: Negative.  Does not bruise/bleed easily.  Psychiatric/Behavioral: Positive for memory loss. Negative for depression. The patient is not nervous/anxious and does not have insomnia.    Performance status (ECOG): 2 Vitals There were no vitals taken for this visit.   Physical Exam Vitals and nursing note reviewed.  Constitutional:      General: She is not in acute distress.    Appearance: She is well-developed. She is not diaphoretic.     Comments: Cane by her side. Required assistance onto exam table.  HENT:     Head: Normocephalic and atraumatic.     Comments: Curly gray hair.    Mouth/Throat:  Pharynx: No oropharyngeal exudate.  Eyes:     General: No scleral icterus.    Conjunctiva/sclera: Conjunctivae normal.     Pupils: Pupils are equal, round, and reactive to light.     Comments: Glasses.  Blue eyes.  Cardiovascular:     Rate and Rhythm: Normal rate and regular rhythm.     Heart sounds: Normal heart sounds. No murmur heard.   Pulmonary:     Effort: Pulmonary effort is normal. No respiratory distress.     Breath sounds: Normal breath sounds. No wheezing or rales.  Chest:     Chest wall: No tenderness.  Breasts:     Right: No axillary adenopathy or supraclavicular adenopathy.     Left: No axillary adenopathy or supraclavicular adenopathy.    Abdominal:     General: Bowel sounds are normal. There is no distension.     Palpations: Abdomen is soft. There is no mass.     Tenderness: There is no abdominal tenderness. There is no guarding or rebound.  Musculoskeletal:        General: No tenderness. Normal range of motion.     Cervical back: Normal range of motion and neck supple.     Right lower leg: Edema (chronic, 1+)  present.     Left lower leg: Edema (chronic, 3+) present.  Lymphadenopathy:     Head:     Right side of head: No preauricular, posterior auricular or occipital adenopathy.     Left side of head: No preauricular, posterior auricular or occipital adenopathy.     Cervical: No cervical adenopathy.     Upper Body:     Right upper body: No supraclavicular or axillary adenopathy.     Left upper body: No supraclavicular or axillary adenopathy.     Lower Body: No right inguinal adenopathy. No left inguinal adenopathy.  Skin:    General: Skin is warm and dry.  Neurological:     Mental Status: She is alert and oriented to person, place, and time.  Psychiatric:        Behavior: Behavior normal.        Thought Content: Thought content normal.        Judgment: Judgment normal.    No visits with results within 3 Day(s) from this visit.  Latest known visit with results is:  Office Visit on 10/01/2020  Component Date Value Ref Range Status  . Glucose, Bld 10/01/2020 103* 65 - 99 mg/dL Final   Comment: .            Fasting reference interval . For someone without known diabetes, a glucose value between 100 and 125 mg/dL is consistent with prediabetes and should be confirmed with a follow-up test. .   . BUN 10/01/2020 12  7 - 25 mg/dL Final  . Creat 10/01/2020 0.79  0.60 - 0.88 mg/dL Final   Comment: For patients >23 years of age, the reference limit for Creatinine is approximately 13% higher for people identified as African-American. .   . GFR, Est Non African American 10/01/2020 65  > OR = 60 mL/min/1.76m Final  . GFR, Est African American 10/01/2020 75  > OR = 60 mL/min/1.756mFinal  . BUN/Creatinine Ratio 0423/53/6144OT APPLICABLE  6 - 22 (calc) Final  . Sodium 10/01/2020 134* 135 - 146 mmol/L Final  . Potassium 10/01/2020 4.6  3.5 - 5.3 mmol/L Final  . Chloride 10/01/2020 99  98 - 110 mmol/L Final  . CO2 10/01/2020 26  20 - 32  mmol/L Final  . Calcium 10/01/2020 9.5  8.6 - 10.4  mg/dL Final  . Total Protein 10/01/2020 7.0  6.1 - 8.1 g/dL Final  . Albumin 10/01/2020 4.1  3.6 - 5.1 g/dL Final  . Globulin 10/01/2020 2.9  1.9 - 3.7 g/dL (calc) Final  . AG Ratio 10/01/2020 1.4  1.0 - 2.5 (calc) Final  . Total Bilirubin 10/01/2020 0.5  0.2 - 1.2 mg/dL Final  . Alkaline phosphatase (APISO) 10/01/2020 27* 37 - 153 U/L Final  . AST 10/01/2020 19  10 - 35 U/L Final  . ALT 10/01/2020 17  6 - 29 U/L Final  . WBC 10/01/2020 9.3  3.8 - 10.8 Thousand/uL Final  . RBC 10/01/2020 3.44* 3.80 - 5.10 Million/uL Final  . Hemoglobin 10/01/2020 10.7* 11.7 - 15.5 g/dL Final  . HCT 10/01/2020 31.8* 35.0 - 45.0 % Final  . MCV 10/01/2020 92.4  80.0 - 100.0 fL Final  . MCH 10/01/2020 31.1  27.0 - 33.0 pg Final  . MCHC 10/01/2020 33.6  32.0 - 36.0 g/dL Final  . RDW 10/01/2020 13.7  11.0 - 15.0 % Final  . Platelets 10/01/2020 225  140 - 400 Thousand/uL Final  . MPV 10/01/2020 9.2  7.5 - 12.5 fL Final  . Neutro Abs 10/01/2020 3,181  1,500 - 7,800 cells/uL Final  . Lymphs Abs 10/01/2020 5,394* 850 - 3,900 cells/uL Final  . Absolute Monocytes 10/01/2020 623  200 - 950 cells/uL Final  . Eosinophils Absolute 10/01/2020 84  15 - 500 cells/uL Final  . Basophils Absolute 10/01/2020 19  0 - 200 cells/uL Final  . Neutrophils Relative % 10/01/2020 34.2  % Final  . Total Lymphocyte 10/01/2020 58.0  % Final  . Monocytes Relative 10/01/2020 6.7  % Final  . Eosinophils Relative 10/01/2020 0.9  % Final  . Basophils Relative 10/01/2020 0.2  % Final    Assessment:  Sydney Mcdaniel is a 87 y.o. female with stage 0 CLL. She presented with a distant history of anemia and recent lymphocytosis. She was noted to have mild lymphocystosis in 06/22/2015. Absolute lymphocyte count (ALC) has ranged between 4700 - 5500.  Work-up on 07/17/2017revealed a hematocrit of 31.6, hemoglobin 11.4, MCV 89, platelets 203,000, WBC 11,00 with an ANC of 3700. Absolute lymphocyte count was 6400. Ferritin was 116. B12was 119  (low). Folate was 44.0. Retic was 0.9% (low). Uric acid was 4.5. Guaiac cards were negative x 3 in 12/2015.  She was diagnosed with B12 deficiency. She receives B12 monthly (last 05/03/2020). Folatewas 28.2 on 07/18/2019.  Peripheral blood flow cytometryrevealed involvement by a CD5+, CD23+, CD38- monoclonal B cell population with lambda light chain restriction consistent with CLL/SLL. There was a small population (8% of the T cells) of double positive (CD4+/CD8+) T cells (described in association with chronic viral infections, autoimmune disorders, chronic inflammatory disorders, and immunodeficiency states).   She has a progressive normocytic anemia. Work-up on 02/12/2018revealed the following normal labs: creatinine, LDH, uric acid, ferritin (105), iron studies (12% sat; TIBC 295), and folate. Retic was 1.1% (low for level of anemia). [place]om[place] was negative on 09/04/2016. Dietis modest. She denies any melena or hematochezia. Guaiac cardswere negative x 3 in 07/2016. Last colonoscopywas >10 years ago.  Work-up on 12/04/2019 revealed a hematocrit of 30.2, hemoglobin 10.0, MCV 92.9, platelets 179,000, WBC 9,000. Creatinine was 0.87 (CrCl 58 ml/min). Normal studies included: folate (31.0), haptoglobin, LDH, hepatitis B surface antigen, hepatitis B core antibody, and hepatitis C antibody.  Retic was 1.4% (low).  Peripheral  smear on 12/04/2019 revealed relative lymphocytosis, comprised of small lymphocytes. There were no increases in larger prolymphocytes. There was normocytic anemia. There was normal platelet count and morphology.    Ferritinhas been followed: 116 on 01/10/2016, 105 on 08/07/2016, 29 on 11/27/2016, 31 on 03/06/2017, 59 on 07/09/2017, 64 on 11/08/2017, 98 on 05/13/2018, 74 on 07/18/2019, 73 on 11/26/2019, and 78 on 03/08/2020.  She was diagnosed with atrial fibrillationafter presenting withsyncopal episodes. She is on Xarelto 15 mg a day.  Chest CTon  11/07/2016 revealed two 2 mm right lung nodules (right major fissure and lateral aspect RUL). There was a 5 mm right lower lobe ground-glass nodule. Chest CTon 11/05/2017 revealed 6 mm ground-glass right lower lobe nodule, stable since 11/07/2016.  There were millimetric pulmonary nodules unchanged and considered benign. Chest CT on 11/07/2019 revealed small scattered lung nodules (up to 5 mm)- stable.  Etiology was likely benign and no further follow-up was recommended.  She was admitted to [facility] from 04/11/2020 and 04/13/2020 with nausea, vomiting, diarrhea, and blood in the stool. Abdomen and pelvis CT with contrast showed inflammatory changes in the sigmoid and rectum consistent with focal colitis.  No definitive areas of active hemorrhage were seen. Some prominent hemorrhoids were noted in the region of the rectum. She was felt to have had food poisoning.  She was treated with Zosyn then discharged on Cipro.   Symptomatically, she is well.  Had 1 episode of nausea/vomiting and diarrhea thought to be due to food.  Intermittent forgetfulness.  Denies B symptoms.  Plan: 1.   Labs today: CBC with diff, CMP, ferritin, iron studies, retic  Labs from 10/18/2020 show a sodium level of 129, hemoglobin 10.3, hematocrit 31.1, WBC 8.2 with normal differential.  Hemolysis labs negative.  Ferritin 68 and iron saturation 26%.  2. CLL  Hematocrit 31.1.  Hemoglobin 10.3.  MCV 92.6.  Platelets 196.  WBC 8200.  ANC 2900 and ALC 4500  Clinically, she continues to do well.  She denies any B symptoms. Exam reveals no adenopathy or hepatosplenomegaly. Continue surveillance every 6 months. 3.   Mild normocytic anemia  Hematocrit 31.1.  Hemoglobin 10.3.  MCV 92.6 on 10/18/2020.  Ferritin 60 with an iron saturation of 26% and TIBC of 330.  Patient is on monthly B12 injections.  Her last scheduled injection is next month.  Recommend initiating B12 oral supplements.  She is on oral  iron twice daily and tolerating well.  DAT+ for IgG c/w a warm autoimmune antibody.   Retic count and LDH normal not consistent with hemolysis on 10/18/2020.   Haptoglobin and bilirubin were normal.  Anemia in CLL can be the result of marrow infiltration, splenomegaly, and hemolytic anemia.  Continue to monitor for any active hemolysis by periodic check (bilirubin, LDH, haptoglobin, peripheral smear-spherocytes)-last on 10/18/2020.   If clear documentation of hemolysis found, plan to initiate steroids and/or Rituxan.  No evidence of active hemolysis today.  2 4. B12 deficiency  She last received B12 on 10/18/2020  She has 1 additional B12 injection scheduled for May.  Recommend initiating oral B12 supplements.  Folate 31.0 on 12/04/2019             Check folate annually.  Check B12 level at next visit. 5.Iron deficiency  Hemoglobin 10.3, MCV 92.6, ferritin 60 and iron saturation 26%  Currently taking iron supplements twice daily. Continue oral iron twice daily.  Consider Venofer if needed.  She has not had a colonoscopy in the past 10 years. 6.  Tiny pulmonary nodules Chest CT on 11/05/2017 revealed a 6 mm RLL nodule.  Chest CT on 11/07/2019 revealed small scattered lung nodules (up to 5 mm)- stable.             No plans for follow-up chest CT. 7. Hyponatremia Sodium 129. Continue to monitor.   Declined IV fluids today.  Recommend incorporating salt into diet. 8.  RTC in 2 weeks for repeat labs to check sodium level and possible IV fluids. 9.  RTC in 4 months for MD assessment, labs (CBC with differential, CMP, ferritin, iron studies, B12 and folate)  I discussed the assessment and treatment plan with the patient.  The patient was provided an opportunity to ask questions and all were answered.  The patient agreed with the plan and demonstrated an understanding of the instructions.  The patient was advised to call back if  the symptoms worsen or if the condition fails to improve as anticipated.  Greater than 50% was spent in counseling and coordination of care with this patient including but not limited to discussion of the relevant topics above (See A&P) including, but not limited to diagnosis and management of acute and chronic medical conditions.   Faythe Casa, NP 10/18/2020 7:54 PM

## 2020-10-18 ENCOUNTER — Encounter: Payer: Self-pay | Admitting: Oncology

## 2020-10-18 ENCOUNTER — Inpatient Hospital Stay (HOSPITAL_BASED_OUTPATIENT_CLINIC_OR_DEPARTMENT_OTHER): Payer: Medicare Other | Admitting: Oncology

## 2020-10-18 ENCOUNTER — Other Ambulatory Visit: Payer: Self-pay

## 2020-10-18 ENCOUNTER — Ambulatory Visit: Payer: Medicare Other

## 2020-10-18 ENCOUNTER — Inpatient Hospital Stay: Payer: Medicare Other

## 2020-10-18 VITALS — BP 176/52 | HR 58 | Temp 97.8°F | Resp 16 | Wt 145.5 lb

## 2020-10-18 DIAGNOSIS — E538 Deficiency of other specified B group vitamins: Secondary | ICD-10-CM

## 2020-10-18 DIAGNOSIS — C9110 Chronic lymphocytic leukemia of B-cell type not having achieved remission: Secondary | ICD-10-CM

## 2020-10-18 LAB — CBC WITH DIFFERENTIAL/PLATELET
Abs Immature Granulocytes: 0.02 10*3/uL (ref 0.00–0.07)
Basophils Absolute: 0 10*3/uL (ref 0.0–0.1)
Basophils Relative: 0 %
Eosinophils Absolute: 0.1 10*3/uL (ref 0.0–0.5)
Eosinophils Relative: 1 %
HCT: 31.1 % — ABNORMAL LOW (ref 36.0–46.0)
Hemoglobin: 10.3 g/dL — ABNORMAL LOW (ref 12.0–15.0)
Immature Granulocytes: 0 %
Lymphocytes Relative: 55 %
Lymphs Abs: 4.5 10*3/uL — ABNORMAL HIGH (ref 0.7–4.0)
MCH: 30.7 pg (ref 26.0–34.0)
MCHC: 33.1 g/dL (ref 30.0–36.0)
MCV: 92.6 fL (ref 80.0–100.0)
Monocytes Absolute: 0.7 10*3/uL (ref 0.1–1.0)
Monocytes Relative: 9 %
Neutro Abs: 2.9 10*3/uL (ref 1.7–7.7)
Neutrophils Relative %: 35 %
Platelets: 196 10*3/uL (ref 150–400)
RBC: 3.36 MIL/uL — ABNORMAL LOW (ref 3.87–5.11)
RDW: 14.6 % (ref 11.5–15.5)
WBC: 8.2 10*3/uL (ref 4.0–10.5)
nRBC: 0 % (ref 0.0–0.2)

## 2020-10-18 LAB — IRON AND TIBC
Iron: 85 ug/dL (ref 28–170)
Saturation Ratios: 26 % (ref 10.4–31.8)
TIBC: 330 ug/dL (ref 250–450)
UIBC: 245 ug/dL

## 2020-10-18 LAB — COMPREHENSIVE METABOLIC PANEL
ALT: 18 U/L (ref 0–44)
AST: 22 U/L (ref 15–41)
Albumin: 4 g/dL (ref 3.5–5.0)
Alkaline Phosphatase: 25 U/L — ABNORMAL LOW (ref 38–126)
Anion gap: 5 (ref 5–15)
BUN: 20 mg/dL (ref 8–23)
CO2: 26 mmol/L (ref 22–32)
Calcium: 9.4 mg/dL (ref 8.9–10.3)
Chloride: 98 mmol/L (ref 98–111)
Creatinine, Ser: 0.9 mg/dL (ref 0.44–1.00)
GFR, Estimated: 60 mL/min — ABNORMAL LOW (ref >60)
Glucose, Bld: 99 mg/dL (ref 70–99)
Potassium: 4.6 mmol/L (ref 3.5–5.1)
Sodium: 129 mmol/L — ABNORMAL LOW (ref 135–145)
Total Bilirubin: 0.6 mg/dL (ref 0.3–1.2)
Total Protein: 7.4 g/dL (ref 6.5–8.1)

## 2020-10-18 LAB — RETIC PANEL
Immature Retic Fract: 5.8 % (ref 2.3–15.9)
RBC.: 3.35 MIL/uL — ABNORMAL LOW (ref 3.87–5.11)
Retic Count, Absolute: 47.9 10*3/uL (ref 19.0–186.0)
Retic Ct Pct: 1.4 % (ref 0.4–3.1)
Reticulocyte Hemoglobin: 33.9 pg (ref >27.9)

## 2020-10-18 LAB — FERRITIN: Ferritin: 60 ng/mL (ref 11–307)

## 2020-10-18 MED ORDER — CYANOCOBALAMIN 1000 MCG/ML IJ SOLN
1000.0000 ug | Freq: Once | INTRAMUSCULAR | Status: AC
Start: 2020-10-18 — End: 2020-10-18
  Administered 2020-10-18: 1000 ug via INTRAMUSCULAR
  Filled 2020-10-18: qty 1

## 2020-10-29 ENCOUNTER — Other Ambulatory Visit: Payer: Self-pay

## 2020-11-01 ENCOUNTER — Other Ambulatory Visit: Payer: Self-pay

## 2020-11-01 ENCOUNTER — Inpatient Hospital Stay: Payer: Medicare Other | Attending: Nurse Practitioner

## 2020-11-01 ENCOUNTER — Inpatient Hospital Stay: Payer: Medicare Other

## 2020-11-01 DIAGNOSIS — E538 Deficiency of other specified B group vitamins: Secondary | ICD-10-CM

## 2020-11-01 DIAGNOSIS — C9110 Chronic lymphocytic leukemia of B-cell type not having achieved remission: Secondary | ICD-10-CM | POA: Insufficient documentation

## 2020-11-01 LAB — COMPREHENSIVE METABOLIC PANEL
ALT: 18 U/L (ref 0–44)
AST: 19 U/L (ref 15–41)
Albumin: 3.5 g/dL (ref 3.5–5.0)
Alkaline Phosphatase: 26 U/L — ABNORMAL LOW (ref 38–126)
Anion gap: 6 (ref 5–15)
BUN: 24 mg/dL — ABNORMAL HIGH (ref 8–23)
CO2: 24 mmol/L (ref 22–32)
Calcium: 9.1 mg/dL (ref 8.9–10.3)
Chloride: 104 mmol/L (ref 98–111)
Creatinine, Ser: 0.88 mg/dL (ref 0.44–1.00)
GFR, Estimated: >60 mL/min (ref >60)
Glucose, Bld: 100 mg/dL — ABNORMAL HIGH (ref 70–99)
Potassium: 4.4 mmol/L (ref 3.5–5.1)
Sodium: 134 mmol/L — ABNORMAL LOW (ref 135–145)
Total Bilirubin: 0.7 mg/dL (ref 0.3–1.2)
Total Protein: 6.9 g/dL (ref 6.5–8.1)

## 2020-11-15 ENCOUNTER — Ambulatory Visit: Payer: Medicare Other

## 2020-11-17 ENCOUNTER — Other Ambulatory Visit: Payer: Self-pay

## 2020-11-17 ENCOUNTER — Encounter: Payer: Self-pay | Admitting: Internal Medicine

## 2020-11-17 ENCOUNTER — Inpatient Hospital Stay: Payer: Medicare Other

## 2020-11-17 ENCOUNTER — Ambulatory Visit (INDEPENDENT_AMBULATORY_CARE_PROVIDER_SITE_OTHER): Payer: Medicare Other | Admitting: Internal Medicine

## 2020-11-17 VITALS — BP 142/52 | HR 49 | Ht 62.0 in | Wt 146.0 lb

## 2020-11-17 DIAGNOSIS — I10 Essential (primary) hypertension: Secondary | ICD-10-CM | POA: Diagnosis not present

## 2020-11-17 DIAGNOSIS — I951 Orthostatic hypotension: Secondary | ICD-10-CM | POA: Diagnosis not present

## 2020-11-17 DIAGNOSIS — I480 Paroxysmal atrial fibrillation: Secondary | ICD-10-CM | POA: Diagnosis not present

## 2020-11-17 DIAGNOSIS — C9110 Chronic lymphocytic leukemia of B-cell type not having achieved remission: Secondary | ICD-10-CM | POA: Diagnosis not present

## 2020-11-17 DIAGNOSIS — I5032 Chronic diastolic (congestive) heart failure: Secondary | ICD-10-CM | POA: Diagnosis not present

## 2020-11-17 DIAGNOSIS — E785 Hyperlipidemia, unspecified: Secondary | ICD-10-CM

## 2020-11-17 DIAGNOSIS — E538 Deficiency of other specified B group vitamins: Secondary | ICD-10-CM

## 2020-11-17 DIAGNOSIS — R55 Syncope and collapse: Secondary | ICD-10-CM | POA: Diagnosis not present

## 2020-11-17 MED ORDER — CYANOCOBALAMIN 1000 MCG/ML IJ SOLN
1000.0000 ug | Freq: Once | INTRAMUSCULAR | Status: AC
Start: 2020-11-17 — End: 2020-11-17
  Administered 2020-11-17: 1000 ug via INTRAMUSCULAR
  Filled 2020-11-17: qty 1

## 2020-11-17 NOTE — Progress Notes (Signed)
Follow-up Outpatient Visit Date: 11/17/2020  Primary Care Provider: Delsa Grana, PA-C [place] [id number]  Chief Complaint: Follow-up vasovagal syncope, atrial fibrillation, and bradycardia  HPI:  [name] is a 87 y.o. female with history of recurrent falls felt to be due to orthostatic hypotension and vasovagal syncope,paroxysmal atrial fibrillation, HFpEF, HTN, CLL, vitamin B12 deficiency, and anemia, who presents for follow-up of hypertension and paroxysmal atrial fibrillation.  She was last seen in the office in 04/2020 by Laurann Montana, NP, at which time she was doing relatively well without additional falls or syncope.  She was working with physical therapy.  Rivaroxaban was decreased to 15 mg daily in the setting of creatinine clearance less than 50.  Otherwise, no medication changes were made.  Today, Ms. Peruski reports that she has been feeling relatively well, denying chest pain, shortness of breath, palpitations, and lightheadedness.  Her chronic lower extremity edema (left greater than right) is stable.  Her daughter notes that she had a fall a few months ago during which she slid out of bed.  She did not have any injuries.  It was not preceded by dizziness.  Ms. Dillow was seen in the ED last month due to nausea and vomiting.  There was again concern for potential food poisoning given that she had just gone out to eat and became very lethargic while on the toilet.  She was also noted to have hyponatremia with a sodium of 129 last month, which prompted her oncologist to recommend increased sodium intake.  Home blood pressures are typically in the 1 20-1 60 range.  Heart rates are usually in the 50s to 60s at rest.  --------------------------------------------------------------------------------------------------  Cardiovascular History & Procedures: Cardiovascular Problems:  Recurrent syncope  Paroxysmal atrial fibrillation  HFpEF  Risk  Factors:  Hypertension and age greater than 36  Cath/PCI:  None  CV Surgery:  None  EP Procedures and Devices:  Event monitor (06/15/16): Dominantly sinus rhythm with paroxysmal atrial fibrillation (4% burden). No significant pauses.  Event monitor(06/02/16): Normal sinus rhythm with first-degree AV block. No significant arrhythmia.  Holter monitor (04/17/16): Predominantly sinus rhythm with first-degree AV block. PACs and short atrial runs lasting up to 4 beats noted. Rare PVCs.  Non-Invasive Evaluation(s):  Carotid Doppler (05/12/2020): Mild right (1-39%) and moderate left (40-59%) internal carotid artery stenoses.  Antegrade flow in both vertebral arteries.  Normal flow hemodynamics in both subclavian arteries.  Pharmacologic MPI (02/26/19): Low risk study without ischemia. Unable to assess LVEF secondary to GI uptake.  TTE (02/26/19): Normal LV size and wall thickness. LVEF 60-65% with grade 2 diastolic dysfunction. Normal RV size and function. Unable to assess RVSP. No significant valvular abnormality.  Carotid Doppler (05/02/18): Mild right ICA stenosis (1-39%). Moderate left ICA stenosis (40-59%).  Carotid Doppler (04/30/17): Moderate (40-59%) proximal ICA stenoses bilaterally. Greater than 50% left external carotid artery stenosis. No significant change from prior study in 04/2016.  TTE (04/17/16): Normal LV size. LVEF 55-60%. Normal wall motion and diastolic function. Mild aortic and mitral regurgitation. Normal RV size and function. Normal pulmonary artery pressure.  Pharmacologic MPI (04/10/16): Low risk study with small fixed defect at the apex, likely due to breast attenuation. No ischemia. LVEF 55-65%.   Recent CV Pertinent Labs: Lab Results  Component Value Date   CHOL 153 07/30/2019   CHOL 141 08/24/2015   HDL 50 07/30/2019   HDL 52 08/24/2015   LDLCALC 81 07/30/2019   LDLCALC 90 07/02/2018  TRIG 108 07/30/2019   CHOLHDL 3.1 07/30/2019    K 4.4 11/01/2020   MG 2.0 04/13/2020   BUN 24 (H) 11/01/2020   BUN 11 04/20/2020   CREATININE 0.88 11/01/2020   CREATININE 0.79 10/01/2020    Past medical and surgical history were reviewed and updated in EPIC.  Current Meds  Medication Sig  . alendronate (FOSAMAX) 70 MG tablet TAKE ONE TABLET BY MOUTH EVERY 7 DAYS. TAKE WITH A FULL GLASS OF WATER ON AN EMPTY STOMACH.  Marland Kitchen amLODipine (NORVASC) 5 MG tablet Take 1 tablet (5 mg total) by mouth daily.  Marland Kitchen atorvastatin (LIPITOR) 20 MG tablet Take 1 tablet (20 mg total) by mouth at bedtime.  . Calcium Carb-Cholecalciferol (CALCIUM 600 + D PO) Take 1 tablet by mouth daily.   . cholecalciferol (VITAMIN D) 1000 units tablet Take 1,000 Units by mouth daily.  . Cyanocobalamin (B-12) 1000 MCG/ML KIT Inject 1,000 mcg as directed every 30 (thirty) days.  . famotidine (PEPCID) 20 MG tablet Take 20 mg by mouth daily.  . ferrous sulfate 325 (65 FE) MG tablet Take 325 mg by mouth 2 (two) times daily with a meal.  . latanoprost (XALATAN) 0.005 % ophthalmic solution Place 1 drop into the right eye at bedtime.   Marland Kitchen levothyroxine (EUTHYROX) 75 MCG tablet TAKE ONE TABLET BY MOUTH ON MONDAYS, WEDNESDAYS, FRIDAYS, AND SUNDAYS  . levothyroxine (EUTHYROX) 88 MCG tablet TAKE 1 TABLET BY MOUTH ON TUESDAYS, THURSDAYS, AND SATURDAYS  . lisinopril (ZESTRIL) 20 MG tablet Take 1 tablet (20 mg total) by mouth daily.  . Multiple Vitamins-Minerals (MULTIVITAMIN ADULT PO) Take by mouth.  . Omega-3 Fatty Acids (FISH OIL) 1200 MG CAPS Take 1,200 mg by mouth daily.   . polyethylene glycol (MIRALAX / GLYCOLAX) 17 g packet Take 17 g by mouth daily.  . Rivaroxaban (XARELTO) 15 MG TABS tablet Take 1 tablet (15 mg total) by mouth daily.  . timolol (TIMOPTIC) 0.5 % ophthalmic solution Place 1 drop into the right eye 2 (two) times daily.     Allergies: Other, Alphagan [brimonidine], Pravachol [pravastatin sodium], and Pravastatin  Social History   Tobacco Use  . Smoking status:  Never Smoker  . Smokeless tobacco: Never Used  Vaping Use  . Vaping Use: Never used  Substance Use Topics  . Alcohol use: No  . Drug use: No    Family History  Problem Relation Age of Onset  . Heart attack Mother   . Glaucoma Mother   . Heart attack Father   . Parkinson's disease Brother   . Heart attack Brother     Review of Systems: A 12-system review of systems was performed and was negative except as noted in the HPI.  --------------------------------------------------------------------------------------------------  Physical Exam: BP (!) 142/52 (BP Location: Right Arm, Patient Position: Sitting, Cuff Size: Normal)   Pulse (!) 49   Ht '5\' 2"'  (1.575 m)   Wt 146 lb (66.2 kg)   LMP  (LMP Unknown)   SpO2 98%   BMI 26.70 kg/m   General:  NAD. Neck: No JVD or HJR. Lungs: Clear to auscultation bilaterally without wheezes or crackles. Heart: Bradycardic but regular with 1/6 systolic murmur.  No rubs or gallops. Abdomen: Soft, nontender, nondistended. Extremities: No lower extremity edema.  EKG: Sinus bradycardia with first-degree AV block and left bundle branch block.  Heart rate has decreased since 09/26/2020.  PR intervals also slightly longer.  Lab Results  Component Value Date   WBC 8.2 10/18/2020   HGB 10.3 (  L) 10/18/2020   HCT 31.1 (L) 10/18/2020   MCV 92.6 10/18/2020   PLT 196 10/18/2020    Lab Results  Component Value Date   NA 134 (L) 11/01/2020   K 4.4 11/01/2020   CL 104 11/01/2020   CO2 24 11/01/2020   BUN 24 (H) 11/01/2020   CREATININE 0.88 11/01/2020   GLUCOSE 100 (H) 11/01/2020   ALT 18 11/01/2020    Lab Results  Component Value Date   CHOL 153 07/30/2019   HDL 50 07/30/2019   LDLCALC 81 07/30/2019   TRIG 108 07/30/2019   CHOLHDL 3.1 07/30/2019    --------------------------------------------------------------------------------------------------  ASSESSMENT AND PLAN: Vasovagal syncope: No further syncopal episodes reported, though Ms.  Vogt was lethargic in the setting of GI illness last month.  ED work-up was unrevealing at that time.  I have encouraged her to stay well-hydrated.  We will defer additional testing at this time.  Paroxysmal atrial fibrillation: Ms. Lupo is in sinus rhythm again today and has not had symptoms to suggest recurrent atrial fibrillation.  We will plan to continue rivaroxaban 15 mg daily based on her creatinine clearance of 41.  We will avoid AV nodal blocking agents in the setting of her underlying bradycardia and first-degree AV block.  Chronic HFpEF: Ms. Shankland has stable bilateral lower extremity edema and is otherwise without symptoms to suggest decompensated heart failure.  I encouraged her to minimize her sodium intake.  We will not make any medication changes today.  Sinus bradycardia, first-degree AV block, and left bundle branch block: Findings have been longstanding without significant symptoms.  Ms. Blasingame did not tolerate discontinuation of metoprolol.  I think it is okay to continue atenolol for now and monitor her heart rate/PR interval.  If she were to develop symptomatic bradycardia, referral to EP for consideration of permanent pacing will need to be considered.  Hypertension: Home blood pressures have been somewhat labile, normal to mildly elevated.  Today's systolic blood pressure is mildly elevated.  We have agreed to defer medication changes.  I have encouraged Ms. Gravette to minimize her sodium intake, as hyponatremia is rarely due to insufficient sodium intake.  Carotid artery stenosis and hyperlipidemia: Carotid Dopplers in 04/2020 were notable for mild to moderate carotid artery stenosis, which was stable.  Most recent lipid panel was more than a year ago.  We will check a lipid panel today for target LDL less than 70.  In the interim, atorvastatin 20 mg daily will be continued.  She will need a follow-up carotid Doppler in about 6 months.  Follow-up: Return to clinic in  6 months.  Nelva Bush, MD 11/17/2020 8:46 AM

## 2020-11-17 NOTE — Patient Instructions (Signed)
Medication Instructions:  - Your physician recommends that you continue on your current medications as directed. Please refer to the Current Medication list given to you today.  *If you need a refill on your cardiac medications before your next appointment, please call your pharmacy*   Lab Work: - Your physician recommends that you have lab work today: LIPID  If you have labs (blood work) drawn today and your tests are completely normal, you will receive your results only by: Marland Kitchen MyChart Message (if you have MyChart) OR . A paper copy in the mail If you have any lab test that is abnormal or we need to change your treatment, we will call you to review the results.   Testing/Procedures: - none ordered   Follow-Up: At [facility], you and your health needs are our priority.  As part of our continuing mission to provide you with exceptional heart care, we have created designated Provider Care Teams.  These Care Teams include your primary Cardiologist (physician) and Advanced Practice Providers (APPs -  Physician Assistants and Nurse Practitioners) who all work together to provide you with the care you need, when you need it.  We recommend signing up for the patient portal called "MyChart".  Sign up information is provided on this After Visit Summary.  MyChart is used to connect with patients for Virtual Visits (Telemedicine).  Patients are able to view lab/test results, encounter notes, upcoming appointments, etc.  Non-urgent messages can be sent to your provider as well.   To learn more about what you can do with MyChart, go to [email or web address].    Your next appointment:   6 month(s)  The format for your next appointment:   In Person  Provider:   You may see Nelva Bush, MD or one of the following Advanced Practice Providers on your designated Care Team:    [name], NP  Christell Faith, PA-C  Marrianne Mood, PA-C  Cadence Holton, [place]  Laurann Montana,  NP    Other Instructions n/a

## 2020-11-18 ENCOUNTER — Telehealth: Payer: Self-pay

## 2020-11-18 DIAGNOSIS — E782 Mixed hyperlipidemia: Secondary | ICD-10-CM

## 2020-11-18 LAB — LIPID PANEL
Chol/HDL Ratio: 3.1 ratio (ref 0.0–4.4)
Cholesterol, Total: 171 mg/dL (ref 100–199)
HDL: 55 mg/dL (ref >39)
LDL Chol Calc (NIH): 90 mg/dL (ref 0–99)
Triglycerides: 147 mg/dL (ref 0–149)
VLDL Cholesterol Cal: 26 mg/dL (ref 5–40)

## 2020-11-18 MED ORDER — ATORVASTATIN CALCIUM 20 MG PO TABS: 40.0000 mg | ORAL_TABLET | Freq: Every day | ORAL | 3 refills | Status: DC

## 2020-11-18 NOTE — Telephone Encounter (Signed)
Able to reach pt regarding her recent lab work, provider had a chance to review her results and Dr. Saunders Revel advised   "Please let Sydney Mcdaniel know that her lipid panel shows that her bad cholesterol (LDL) is above our goal at 90 (target < 70). Given her history of carotid artery stenosis, I suggest that we increase atorvastatin to 40 mg daily and repeat a lipid panel and ALT in about 3 months to ensure that her lipids have responded appropriately."  Pt verbalized understanding, will start taking 2 20 mg Lipitor at bedtime for 40 mg and will get her labs [facility] at [facility] in 3 months. Will upload this to her MyChart as requested to let her daughter know and be aware of med change and labs. Otherwise pt is thankful for the return call.  Medication list has been updated to 40 mg Lipitor HS and repeat lipid panel reorder for medical mall.

## 2020-11-18 NOTE — Telephone Encounter (Signed)
LFT lab order added with lipid in 3 months

## 2020-11-24 ENCOUNTER — Other Ambulatory Visit: Payer: Self-pay | Admitting: *Deleted

## 2020-11-24 MED ORDER — ATORVASTATIN CALCIUM 40 MG PO TABS: 40.0000 mg | ORAL_TABLET | Freq: Every day | ORAL | 1 refills | Status: DC

## 2021-01-04 DIAGNOSIS — H401131 Primary open-angle glaucoma, bilateral, mild stage: Secondary | ICD-10-CM | POA: Diagnosis not present

## 2021-01-06 ENCOUNTER — Encounter: Payer: Self-pay | Admitting: Unknown Physician Specialty

## 2021-01-06 ENCOUNTER — Other Ambulatory Visit: Payer: Self-pay

## 2021-01-06 ENCOUNTER — Ambulatory Visit (INDEPENDENT_AMBULATORY_CARE_PROVIDER_SITE_OTHER): Payer: Medicare Other | Admitting: Unknown Physician Specialty

## 2021-01-06 VITALS — BP 178/82 | HR 75 | Temp 98.2°F | Resp 14 | Ht 62.0 in | Wt 145.0 lb

## 2021-01-06 DIAGNOSIS — I10 Essential (primary) hypertension: Secondary | ICD-10-CM | POA: Diagnosis not present

## 2021-01-06 DIAGNOSIS — M12811 Other specific arthropathies, not elsewhere classified, right shoulder: Secondary | ICD-10-CM | POA: Diagnosis not present

## 2021-01-06 MED ORDER — TRIAMCINOLONE ACETONIDE 40 MG/ML IJ SUSP
40.0000 mg | Freq: Once | INTRAMUSCULAR | Status: AC
  Administered 2021-01-06: 40 mg via INTRAMUSCULAR

## 2021-01-06 MED ORDER — LIDOCAINE HCL (PF) 1 % IJ SOLN
2.0000 mL | Freq: Once | INTRAMUSCULAR | Status: AC
  Administered 2021-01-06: 2 mL via INTRADERMAL

## 2021-01-06 MED ORDER — AMLODIPINE BESYLATE 2.5 MG PO TABS: 2.5000 mg | ORAL_TABLET | Freq: Every day | ORAL | 0 refills | Status: DC

## 2021-01-06 NOTE — Progress Notes (Signed)
BP (!) 178/82   Pulse 75   Temp 98.2 F (36.8 C) (Oral)   Resp 14   Ht 5\' 2"  (1.575 m)   Wt 145 lb (65.8 kg)   LMP  (LMP Unknown)   SpO2 98%   BMI 26.52 kg/m    Subjective:    Patient ID: Sydney Mcdaniel, female    DOB: 19-Apr-1925, 87 y.o.   MRN: [id number]  HPI: Sydney Mcdaniel is a 87 y.o. female  Chief Complaint  Patient presents with   Hypertension    BP has been fluctuating, low in the mornings   Arm Pain    Right arm pain   Right arm pain For one week following 2 sessions of shucking corn.  Went to urgent care and received steroids which has not helped.  She had been to PT for balance but "graduated."  Hypertension Lower BP in the morning which correlate with feeling bad.  SBP is less than 115 during this time.  Higher this morning in the office but having significant shoulder pain which woke her up.   Sees cardiology for BP management but they asked her to come here for further evaluation.     Relevant past medical, surgical, family and social history reviewed and updated as indicated. Interim medical history since our last visit reviewed. Allergies and medications reviewed and updated.  Review of Systems  Per HPI unless specifically indicated above     Objective:    BP (!) 178/82   Pulse 75   Temp 98.2 F (36.8 C) (Oral)   Resp 14   Ht 5\' 2"  (1.575 m)   Wt 145 lb (65.8 kg)   LMP  (LMP Unknown)   SpO2 98%   BMI 26.52 kg/m   Wt Readings from Last 3 Encounters:  01/06/21 145 lb (65.8 kg)  11/17/20 146 lb (66.2 kg)  10/18/20 145 lb 8.1 oz (66 kg)    Physical Exam Constitutional:      General: She is not in acute distress.    Appearance: Normal appearance. She is well-developed.  HENT:     Head: Normocephalic and atraumatic.  Eyes:     General: Lids are normal. No scleral icterus.       Right eye: No discharge.        Left eye: No discharge.     Conjunctiva/sclera: Conjunctivae normal.  Neck:     Vascular: No carotid bruit or JVD.   Cardiovascular:     Rate and Rhythm: Normal rate and regular rhythm.     Heart sounds: Normal heart sounds.  Pulmonary:     Effort: Pulmonary effort is normal. No respiratory distress.     Breath sounds: Normal breath sounds.  Abdominal:     Palpations: There is no hepatomegaly or splenomegaly.  Musculoskeletal:        General: Normal range of motion.     Cervical back: Normal range of motion and neck supple.  Skin:    General: Skin is warm and dry.     Coloration: Skin is not pale.     Findings: No rash.  Neurological:     Mental Status: She is alert and oriented to person, place, and time.  Psychiatric:        Behavior: Behavior normal.        Thought Content: Thought content normal.        Judgment: Judgment normal.    Results for orders placed or performed in visit on 11/17/20  Lipid Profile  Result Value Ref Range   Cholesterol, Total 171 100 - 199 mg/dL   Triglycerides 147 0 - 149 mg/dL   HDL 55 >39 mg/dL   VLDL Cholesterol Cal 26 5 - 40 mg/dL   LDL Chol Calc (NIH) 90 0 - 99 mg/dL   Chol/HDL Ratio 3.1 0.0 - 4.4 ratio      Assessment & Plan:   Problem List Items Addressed This Visit       Unprioritized   Essential hypertension    BP low normal in the AM and correlates with feeling bad.  Will decrease her Amlodipine to 2.5 mg as that seems to be the medication causing her lower BP       Relevant Medications   amLODipine (NORVASC) 2.5 MG tablet   Other Visit Diagnoses     Rotator cuff arthropathy of right shoulder    -  Primary   Steroid injection done today.  Will refer back to PT.  Tylenol for pain   Relevant Medications   triamcinolone acetonide (KENALOG-40) injection 40 mg (Start on 01/06/2021 11:00 AM)   lidocaine (PF) (XYLOCAINE) 1 % injection 2 mL (Start on 01/06/2021 11:00 AM)   Other Relevant Orders   Ambulatory referral to Physical Therapy       STEROID INJECTION  Procedure: Shoulder Intraarticular Steroid Injection Consent: DEL Risks,  benefits, and alternative treatments discussed and all questions were answered.  Patient elected to proceed and verbal consent obtained.  Description: Area prepped and draped using   semi-sterile technique. Using a posterolateral approach a mixture of 1 cc of Lidocaine & 1 cc Kenalog 40 injected in shoulder joint.  A bandage was then placed over the injection site. Complications:  none    Follow up plan: Return if symptoms worsen or fail to improve.

## 2021-01-06 NOTE — Assessment & Plan Note (Signed)
BP low normal in the AM and correlates with feeling bad.  Will decrease her Amlodipine to 2.5 mg as that seems to be the medication causing her lower BP

## 2021-01-12 ENCOUNTER — Ambulatory Visit: Payer: PRIVATE HEALTH INSURANCE | Admitting: Family Medicine

## 2021-01-12 ENCOUNTER — Encounter: Payer: Self-pay | Admitting: Unknown Physician Specialty

## 2021-01-13 ENCOUNTER — Ambulatory Visit: Payer: PRIVATE HEALTH INSURANCE | Admitting: Family Medicine

## 2021-02-07 ENCOUNTER — Other Ambulatory Visit: Payer: Medicare Other

## 2021-02-07 ENCOUNTER — Ambulatory Visit: Payer: Medicare Other

## 2021-02-09 ENCOUNTER — Ambulatory Visit: Payer: Medicare Other | Admitting: Oncology

## 2021-02-09 ENCOUNTER — Other Ambulatory Visit: Payer: Medicare Other

## 2021-02-11 ENCOUNTER — Inpatient Hospital Stay: Payer: Medicare Other

## 2021-02-11 ENCOUNTER — Inpatient Hospital Stay
Admission: EM | Admit: 2021-02-11 | Discharge: 2021-02-14 | DRG: 644 | Disposition: A | Discharge status: Home Health | Payer: Medicare Other | Attending: Obstetrics and Gynecology | Admitting: Obstetrics and Gynecology

## 2021-02-11 ENCOUNTER — Emergency Department: Payer: Medicare Other

## 2021-02-11 ENCOUNTER — Encounter: Payer: Self-pay | Admitting: Internal Medicine

## 2021-02-11 ENCOUNTER — Other Ambulatory Visit: Payer: Self-pay

## 2021-02-11 DIAGNOSIS — C9110 Chronic lymphocytic leukemia of B-cell type not having achieved remission: Secondary | ICD-10-CM | POA: Diagnosis not present

## 2021-02-11 DIAGNOSIS — E861 Hypovolemia: Secondary | ICD-10-CM | POA: Diagnosis present

## 2021-02-11 DIAGNOSIS — I480 Paroxysmal atrial fibrillation: Secondary | ICD-10-CM | POA: Diagnosis present

## 2021-02-11 DIAGNOSIS — Z8249 Family history of ischemic heart disease and other diseases of the circulatory system: Secondary | ICD-10-CM | POA: Diagnosis not present

## 2021-02-11 DIAGNOSIS — M25552 Pain in left hip: Secondary | ICD-10-CM | POA: Diagnosis present

## 2021-02-11 DIAGNOSIS — K219 Gastro-esophageal reflux disease without esophagitis: Secondary | ICD-10-CM | POA: Diagnosis present

## 2021-02-11 DIAGNOSIS — M810 Age-related osteoporosis without current pathological fracture: Secondary | ICD-10-CM | POA: Diagnosis present

## 2021-02-11 DIAGNOSIS — Z820 Family history of epilepsy and other diseases of the nervous system: Secondary | ICD-10-CM | POA: Diagnosis not present

## 2021-02-11 DIAGNOSIS — E785 Hyperlipidemia, unspecified: Secondary | ICD-10-CM | POA: Diagnosis not present

## 2021-02-11 DIAGNOSIS — Z83511 Family history of glaucoma: Secondary | ICD-10-CM | POA: Diagnosis not present

## 2021-02-11 DIAGNOSIS — R000 Tachycardia, unspecified: Secondary | ICD-10-CM | POA: Diagnosis not present

## 2021-02-11 DIAGNOSIS — Y92009 Unspecified place in unspecified non-institutional (private) residence as the place of occurrence of the external cause: Secondary | ICD-10-CM

## 2021-02-11 DIAGNOSIS — R079 Chest pain, unspecified: Secondary | ICD-10-CM | POA: Diagnosis not present

## 2021-02-11 DIAGNOSIS — E222 Syndrome of inappropriate secretion of antidiuretic hormone: Secondary | ICD-10-CM | POA: Diagnosis not present

## 2021-02-11 DIAGNOSIS — Z043 Encounter for examination and observation following other accident: Secondary | ICD-10-CM | POA: Diagnosis not present

## 2021-02-11 DIAGNOSIS — E871 Hypo-osmolality and hyponatremia: Secondary | ICD-10-CM | POA: Diagnosis present

## 2021-02-11 DIAGNOSIS — G3184 Mild cognitive impairment, so stated: Secondary | ICD-10-CM | POA: Diagnosis present

## 2021-02-11 DIAGNOSIS — E039 Hypothyroidism, unspecified: Secondary | ICD-10-CM | POA: Diagnosis present

## 2021-02-11 DIAGNOSIS — Z79899 Other long term (current) drug therapy: Secondary | ICD-10-CM | POA: Diagnosis not present

## 2021-02-11 DIAGNOSIS — Z7983 Long term (current) use of bisphosphonates: Secondary | ICD-10-CM

## 2021-02-11 DIAGNOSIS — R059 Cough, unspecified: Secondary | ICD-10-CM | POA: Diagnosis present

## 2021-02-11 DIAGNOSIS — R001 Bradycardia, unspecified: Secondary | ICD-10-CM | POA: Diagnosis not present

## 2021-02-11 DIAGNOSIS — I4891 Unspecified atrial fibrillation: Secondary | ICD-10-CM | POA: Diagnosis not present

## 2021-02-11 DIAGNOSIS — M545 Low back pain, unspecified: Secondary | ICD-10-CM | POA: Diagnosis not present

## 2021-02-11 DIAGNOSIS — Z7901 Long term (current) use of anticoagulants: Secondary | ICD-10-CM | POA: Diagnosis not present

## 2021-02-11 DIAGNOSIS — Z66 Do not resuscitate: Secondary | ICD-10-CM | POA: Diagnosis present

## 2021-02-11 DIAGNOSIS — Z20822 Contact with and (suspected) exposure to covid-19: Secondary | ICD-10-CM | POA: Diagnosis present

## 2021-02-11 DIAGNOSIS — I10 Essential (primary) hypertension: Secondary | ICD-10-CM | POA: Diagnosis present

## 2021-02-11 DIAGNOSIS — M549 Dorsalgia, unspecified: Secondary | ICD-10-CM | POA: Diagnosis not present

## 2021-02-11 DIAGNOSIS — R109 Unspecified abdominal pain: Secondary | ICD-10-CM | POA: Diagnosis not present

## 2021-02-11 DIAGNOSIS — I491 Atrial premature depolarization: Secondary | ICD-10-CM | POA: Diagnosis not present

## 2021-02-11 DIAGNOSIS — R918 Other nonspecific abnormal finding of lung field: Secondary | ICD-10-CM | POA: Diagnosis not present

## 2021-02-11 DIAGNOSIS — R197 Diarrhea, unspecified: Secondary | ICD-10-CM | POA: Diagnosis present

## 2021-02-11 DIAGNOSIS — R911 Solitary pulmonary nodule: Secondary | ICD-10-CM | POA: Diagnosis not present

## 2021-02-11 DIAGNOSIS — W19XXXA Unspecified fall, initial encounter: Secondary | ICD-10-CM | POA: Diagnosis present

## 2021-02-11 DIAGNOSIS — R4182 Altered mental status, unspecified: Secondary | ICD-10-CM | POA: Diagnosis not present

## 2021-02-11 DIAGNOSIS — I700 Atherosclerosis of aorta: Secondary | ICD-10-CM | POA: Diagnosis not present

## 2021-02-11 DIAGNOSIS — Z90710 Acquired absence of both cervix and uterus: Secondary | ICD-10-CM

## 2021-02-11 DIAGNOSIS — G319 Degenerative disease of nervous system, unspecified: Secondary | ICD-10-CM | POA: Diagnosis not present

## 2021-02-11 LAB — SODIUM: Sodium: 126 mmol/L — ABNORMAL LOW (ref 135–145)

## 2021-02-11 LAB — COMPREHENSIVE METABOLIC PANEL
ALT: 19 U/L (ref 0–44)
AST: 25 U/L (ref 15–41)
Albumin: 3.9 g/dL (ref 3.5–5.0)
Alkaline Phosphatase: 33 U/L — ABNORMAL LOW (ref 38–126)
Anion gap: 9 (ref 5–15)
BUN: 12 mg/dL (ref 8–23)
CO2: 22 mmol/L (ref 22–32)
Calcium: 8.8 mg/dL — ABNORMAL LOW (ref 8.9–10.3)
Chloride: 91 mmol/L — ABNORMAL LOW (ref 98–111)
Creatinine, Ser: 0.71 mg/dL (ref 0.44–1.00)
GFR, Estimated: >60 mL/min (ref >60)
Glucose, Bld: 142 mg/dL — ABNORMAL HIGH (ref 70–99)
Potassium: 3.6 mmol/L (ref 3.5–5.1)
Sodium: 122 mmol/L — ABNORMAL LOW (ref 135–145)
Total Bilirubin: 1.4 mg/dL — ABNORMAL HIGH (ref 0.3–1.2)
Total Protein: 7.1 g/dL (ref 6.5–8.1)

## 2021-02-11 LAB — URIC ACID: Uric Acid, Serum: 4.3 mg/dL (ref 2.5–7.1)

## 2021-02-11 LAB — TSH: TSH: 4.253 u[IU]/mL (ref 0.350–4.500)

## 2021-02-11 LAB — CBC WITH DIFFERENTIAL/PLATELET
Abs Immature Granulocytes: 0.07 10*3/uL (ref 0.00–0.07)
Basophils Absolute: 0 10*3/uL (ref 0.0–0.1)
Basophils Relative: 0 %
Eosinophils Absolute: 0 10*3/uL (ref 0.0–0.5)
Eosinophils Relative: 0 %
HCT: 31.7 % — ABNORMAL LOW (ref 36.0–46.0)
Hemoglobin: 10.9 g/dL — ABNORMAL LOW (ref 12.0–15.0)
Immature Granulocytes: 1 %
Lymphocytes Relative: 27 %
Lymphs Abs: 3.8 10*3/uL (ref 0.7–4.0)
MCH: 30.6 pg (ref 26.0–34.0)
MCHC: 34.4 g/dL (ref 30.0–36.0)
MCV: 89 fL (ref 80.0–100.0)
Monocytes Absolute: 0.6 10*3/uL (ref 0.1–1.0)
Monocytes Relative: 4 %
Neutro Abs: 9.9 10*3/uL — ABNORMAL HIGH (ref 1.7–7.7)
Neutrophils Relative %: 68 %
Platelets: 196 10*3/uL (ref 150–400)
RBC: 3.56 MIL/uL — ABNORMAL LOW (ref 3.87–5.11)
RDW: 14.1 % (ref 11.5–15.5)
WBC: 14.5 10*3/uL — ABNORMAL HIGH (ref 4.0–10.5)
nRBC: 0 % (ref 0.0–0.2)

## 2021-02-11 LAB — URINALYSIS, COMPLETE (UACMP) WITH MICROSCOPIC
Bacteria, UA: NONE SEEN
Bilirubin Urine: NEGATIVE
Glucose, UA: NEGATIVE mg/dL
Hgb urine dipstick: NEGATIVE
Ketones, ur: 20 mg/dL — AB
Leukocytes,Ua: NEGATIVE
Nitrite: NEGATIVE
Protein, ur: NEGATIVE mg/dL
Specific Gravity, Urine: 1.008 (ref 1.005–1.030)
Squamous Epithelial / HPF: NONE SEEN (ref 0–5)
pH: 7 (ref 5.0–8.0)

## 2021-02-11 LAB — OSMOLALITY, URINE: Osmolality, Ur: 140 mOsm/kg — ABNORMAL LOW (ref 300–900)

## 2021-02-11 LAB — CREATININE, URINE, RANDOM: Creatinine, Urine: 23 mg/dL

## 2021-02-11 LAB — TROPONIN I (HIGH SENSITIVITY)
Troponin I (High Sensitivity): 17 ng/L (ref <18)
Troponin I (High Sensitivity): 23 ng/L — ABNORMAL HIGH (ref <18)

## 2021-02-11 LAB — OSMOLALITY: Osmolality: 266 mOsm/kg — ABNORMAL LOW (ref 275–295)

## 2021-02-11 LAB — CORTISOL: Cortisol, Plasma: 37 ug/dL

## 2021-02-11 LAB — RESP PANEL BY RT-PCR (FLU A&B, COVID) ARPGX2
Influenza A by PCR: NEGATIVE
Influenza B by PCR: NEGATIVE
SARS Coronavirus 2 by RT PCR: NEGATIVE

## 2021-02-11 LAB — SODIUM, URINE, RANDOM: Sodium, Ur: 73 mmol/L

## 2021-02-11 MED ORDER — ATORVASTATIN CALCIUM 20 MG PO TABS
40.0000 mg | ORAL_TABLET | Freq: Every evening | ORAL | Status: DC
  Administered 2021-02-11 – 2021-02-13 (×3): 40 mg via ORAL
  Filled 2021-02-11 (×3): qty 2

## 2021-02-11 MED ORDER — FAMOTIDINE 20 MG PO TABS
20.0000 mg | ORAL_TABLET | Freq: Every day | ORAL | Status: DC
  Administered 2021-02-11 – 2021-02-14 (×4): 20 mg via ORAL
  Filled 2021-02-11 (×4): qty 1

## 2021-02-11 MED ORDER — SODIUM CHLORIDE 0.9% FLUSH
3.0000 mL | Freq: Two times a day (BID) | INTRAVENOUS | Status: DC
  Administered 2021-02-11 – 2021-02-14 (×5): 3 mL via INTRAVENOUS

## 2021-02-11 MED ORDER — ONDANSETRON HCL 4 MG/2ML IJ SOLN
4.0000 mg | Freq: Once | INTRAMUSCULAR | Status: AC
  Administered 2021-02-11: 4 mg via INTRAVENOUS
  Filled 2021-02-11: qty 2

## 2021-02-11 MED ORDER — LEVOTHYROXINE SODIUM 88 MCG PO TABS
88.0000 ug | ORAL_TABLET | Freq: Every day | ORAL | Status: DC
  Administered 2021-02-12 – 2021-02-14 (×3): 88 ug via ORAL
  Filled 2021-02-11 (×3): qty 1

## 2021-02-11 MED ORDER — SODIUM CHLORIDE 0.9 % IV SOLN: INTRAVENOUS | Status: AC

## 2021-02-11 MED ORDER — ACETAMINOPHEN 650 MG RE SUPP: 650.0000 mg | Freq: Four times a day (QID) | RECTAL | Status: DC | PRN

## 2021-02-11 MED ORDER — VITAMIN D 25 MCG (1000 UNIT) PO TABS
1000.0000 [IU] | ORAL_TABLET | Freq: Every day | ORAL | Status: DC
  Administered 2021-02-11 – 2021-02-14 (×4): 1000 [IU] via ORAL
  Filled 2021-02-11 (×4): qty 1

## 2021-02-11 MED ORDER — POLYETHYLENE GLYCOL 3350 17 G PO PACK
17.0000 g | PACK | Freq: Every day | ORAL | Status: DC
  Administered 2021-02-11 – 2021-02-14 (×4): 17 g via ORAL
  Filled 2021-02-11 (×4): qty 1

## 2021-02-11 MED ORDER — LISINOPRIL 20 MG PO TABS
20.0000 mg | ORAL_TABLET | Freq: Every day | ORAL | Status: DC
  Administered 2021-02-11 – 2021-02-14 (×4): 20 mg via ORAL
  Filled 2021-02-11 (×2): qty 1
  Filled 2021-02-11: qty 2
  Filled 2021-02-11: qty 1

## 2021-02-11 MED ORDER — SODIUM CHLORIDE 0.9 % IV SOLN: INTRAVENOUS | Status: DC

## 2021-02-11 MED ORDER — ADULT MULTIVITAMIN W/MINERALS CH
ORAL_TABLET | Freq: Every day | ORAL | Status: DC
Start: 2021-02-11 — End: 2021-02-14
  Administered 2021-02-11 – 2021-02-14 (×4): 1 via ORAL
  Filled 2021-02-11 (×4): qty 1

## 2021-02-11 MED ORDER — CALCIUM CARBONATE-VITAMIN D 500-200 MG-UNIT PO TABS
ORAL_TABLET | Freq: Every day | ORAL | Status: DC
  Administered 2021-02-11 – 2021-02-14 (×4): 1 via ORAL
  Filled 2021-02-11 (×4): qty 1

## 2021-02-11 MED ORDER — FENTANYL CITRATE (PF) 100 MCG/2ML IJ SOLN
50.0000 ug | Freq: Once | INTRAMUSCULAR | Status: AC
  Administered 2021-02-11: 50 ug via INTRAVENOUS
  Filled 2021-02-11: qty 2

## 2021-02-11 MED ORDER — TIMOLOL MALEATE 0.5 % OP SOLN
1.0000 [drp] | Freq: Two times a day (BID) | OPHTHALMIC | Status: DC
  Administered 2021-02-11 – 2021-02-14 (×6): 1 [drp] via OPHTHALMIC
  Filled 2021-02-11: qty 5

## 2021-02-11 MED ORDER — IOHEXOL 350 MG/ML SOLN
75.0000 mL | Freq: Once | INTRAVENOUS | Status: AC | PRN
  Administered 2021-02-11: 75 mL via INTRAVENOUS

## 2021-02-11 MED ORDER — ACETAMINOPHEN 325 MG PO TABS
650.0000 mg | ORAL_TABLET | Freq: Four times a day (QID) | ORAL | Status: DC | PRN
  Administered 2021-02-12 – 2021-02-14 (×3): 650 mg via ORAL
  Filled 2021-02-11 (×3): qty 2

## 2021-02-11 MED ORDER — AMOXICILLIN 500 MG PO CAPS
500.0000 mg | ORAL_CAPSULE | Freq: Three times a day (TID) | ORAL | Status: DC
  Administered 2021-02-11 – 2021-02-14 (×10): 500 mg via ORAL
  Filled 2021-02-11 (×14): qty 1

## 2021-02-11 MED ORDER — OMEGA-3-ACID ETHYL ESTERS 1 G PO CAPS
1.0000 g | ORAL_CAPSULE | Freq: Every day | ORAL | Status: DC
  Administered 2021-02-11 – 2021-02-14 (×4): 1 g via ORAL
  Filled 2021-02-11 (×5): qty 1

## 2021-02-11 MED ORDER — RIVAROXABAN 15 MG PO TABS
15.0000 mg | ORAL_TABLET | Freq: Every day | ORAL | Status: DC
  Administered 2021-02-11 – 2021-02-13 (×3): 15 mg via ORAL
  Filled 2021-02-11 (×5): qty 1

## 2021-02-11 MED ORDER — SODIUM CHLORIDE 0.9 % IV SOLN: 250.0000 mL | INTRAVENOUS | Status: DC | PRN

## 2021-02-11 MED ORDER — ONDANSETRON HCL 4 MG PO TABS: 4.0000 mg | ORAL_TABLET | Freq: Four times a day (QID) | ORAL | Status: DC | PRN

## 2021-02-11 MED ORDER — ONDANSETRON HCL 4 MG/2ML IJ SOLN: 4.0000 mg | Freq: Four times a day (QID) | INTRAMUSCULAR | Status: DC | PRN

## 2021-02-11 MED ORDER — LATANOPROST 0.005 % OP SOLN
1.0000 [drp] | Freq: Every day | OPHTHALMIC | Status: DC
  Administered 2021-02-11 – 2021-02-13 (×3): 1 [drp] via OPHTHALMIC
  Filled 2021-02-11: qty 2.5

## 2021-02-11 MED ORDER — SODIUM CHLORIDE 0.9% FLUSH: 3.0000 mL | INTRAVENOUS | Status: DC | PRN

## 2021-02-11 MED ORDER — FERROUS SULFATE 325 (65 FE) MG PO TABS
325.0000 mg | ORAL_TABLET | Freq: Two times a day (BID) | ORAL | Status: DC
  Administered 2021-02-11 – 2021-02-14 (×6): 325 mg via ORAL
  Filled 2021-02-11 (×6): qty 1

## 2021-02-11 NOTE — ED Notes (Signed)
Saline paused while pt is eating lunch

## 2021-02-11 NOTE — ED Notes (Signed)
Informed RN bed assigned 

## 2021-02-11 NOTE — ED Provider Notes (Signed)
[facility] Emergency Department Provider Note ____________________________________________   Event Date/Time   First MD Initiated Contact with Patient 02/11/21 0030     (approximate)  I have reviewed the triage vital signs and the nursing notes.   HISTORY  Chief Complaint Fall (C/o unwitnessed fall at home with left hip pain, back pain and possible head trauma. Pt. Had 2 teeth extracted today. /)    HPI Sydney Mcdaniel is a 87 y.o. female with history of CLL, atrial fibrillation on Xarelto, hypertension, hyperlipidemia, hypothyroidism who presents to the emergency department from home with EMS after she had a fall.  Patient is unable to tell me why she fell.  She is complaining of left hip pain, back pain.  Fall was reportedly unwitnessed.  Unclear if she hit her head.  She denies chest pain, shortness of breath but states she has had cough and diarrhea.  No vomiting.     Patient's daughter at bedside.  She states that patient lives at home alone and she lives about 200 yards away from her.  She states tonight patient pressed her life alert button after she fell in the bathroom around 6:15 PM.  They noted that she progressively became more confused over several hours and also was complaining of hip and back pain.  That is when they decided to call 911 to bring her to the emergency department.  They also state that she continue to state that she was going to fall anytime they try to move her.  She is also doing this here in the emergency department when we try to move her from the EMS stretcher to the bed and roll her on her side to look at her back.         Past Medical History:  Diagnosis Date   Allergy    Anemia    Cataract    CLL (chronic lymphocytic leukemia) (HCC)    GERD (gastroesophageal reflux disease)    Glaucoma    H/O: hysterectomy    Total   Hyperlipidemia    Hypertension    Hypothyroidism    Impaired fasting glucose    Lichen sclerosus     Osteoporosis    Hips   PAF (paroxysmal atrial fibrillation) ([place])    a. 06/15/2016 Event monitor: 4% afib burden; b. CHA2DS2VASc - 4-->Xarelto.   Syncope    a. 03/2016 Echo: EF55-60%, no rwma, mild AI/MR, nl PASP; b. 03/2016 48h Holter: no significant arrhythmias/pauses; c. 03/2016 MV: mild apical defect, likely breast attenuation, nl EF, low risk; d. 05/2016 Event monitor: No significant arrhythmia; e. 05/2016 Event monitor: PAF (4%).    Patient Active Problem List   Diagnosis Date Noted   Normocytic anemia 05/31/2020   Hematochezia 04/11/2020   Acute colitis 04/11/2020   GI bleeding 04/11/2020   Iron deficiency anemia 03/08/2020   Goals of care, counseling/discussion 03/08/2020   Positive direct antiglobulin test (DAT) 12/04/2019   Sinus bradycardia 04/09/2019   First degree AV block 04/09/2019   Postural dizziness with presyncope 02/25/2019   Osteoporosis 08/21/2018   Osteoarthritis of left hip 05/21/2018   Bilateral carotid artery stenosis 04/19/2018   History of syncope 10/17/2017   MCI (mild cognitive impairment) 08/12/2017   Lung nodules 07/09/2017   Paroxysmal atrial fibrillation ([place]) 04/18/2017   Pleural effusion 10/20/2016   Hyponatremia 10/20/2016   Chronic diastolic congestive heart failure ([place]) 06/28/2016   Primary open-angle glaucoma, bilateral, mild stage 05/08/2016   Labile blood glucose 04/27/2016  Bladder prolapse, female, acquired 02/25/2016   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   B12 deficiency 01/11/2016   Anemia 01/10/2016   CLL (chronic lymphocytic leukemia) ([place]) 01/10/2016   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 12/21/2015   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 12/21/2015   Bradycardia 12/21/2015   Lymphocytosis 06/25/2015   Impaired fasting glucose 06/22/2015   Carotid atherosclerosis 06/22/2015   Essential hypertension 12/22/2014   Hyperlipidemia 12/22/2014   Hypothyroidism 12/22/2014    Past Surgical History:  Procedure  Laterality Date   APPENDECTOMY     EYE SURGERY     Glaucoma   TOTAL ABDOMINAL HYSTERECTOMY      Prior to Admission medications   Medication Sig Start Date End Date Taking? Authorizing Provider  alendronate (FOSAMAX) 70 MG tablet TAKE ONE TABLET BY MOUTH EVERY 7 DAYS. TAKE WITH A FULL GLASS OF WATER ON AN EMPTY STOMACH. 06/08/20  Yes Delsa Grana, PA-C  amoxicillin (AMOXIL) 500 MG capsule Take 500 mg by mouth 3 (three) times daily. 02/10/21  Yes [provider]  atorvastatin (LIPITOR) 40 MG tablet Take 1 tablet (40 mg total) by mouth daily. 11/24/20 02/22/21 Yes End, Harrell Gave, MD  Calcium Carb-Cholecalciferol (CALCIUM 600 + D PO) Take 1 tablet by mouth daily.    Yes [provider]  cholecalciferol (VITAMIN D) 1000 units tablet Take 1,000 Units by mouth daily.   Yes [provider]  famotidine (PEPCID) 20 MG tablet Take 20 mg by mouth daily.   Yes [provider]  ferrous sulfate 325 (65 FE) MG tablet Take 325 mg by mouth 2 (two) times daily with a meal.   Yes [provider]  latanoprost (XALATAN) 0.005 % ophthalmic solution Place 1 drop into the right eye at bedtime.  04/24/15  Yes [provider]  levothyroxine (EUTHYROX) 75 MCG tablet TAKE ONE TABLET BY MOUTH ON MONDAYS, WEDNESDAYS, FRIDAYS, AND SUNDAYS 04/08/20  Yes Delsa Grana, PA-C  levothyroxine (EUTHYROX) 88 MCG tablet TAKE 1 TABLET BY MOUTH ON TUESDAYS, THURSDAYS, AND SATURDAYS 04/08/20  Yes Lucio Edward, Leisa, PA-C  lisinopril (ZESTRIL) 20 MG tablet Take 1 tablet (20 mg total) by mouth daily. 05/19/20  Yes Loel Dubonnet, NP  Multiple Vitamins-Minerals (MULTIVITAMIN ADULT PO) Take 1 tablet by mouth daily.   Yes [provider]  Omega-3 Fatty Acids (FISH OIL) 1200 MG CAPS Take 1,200 mg by mouth daily.    Yes [provider]  polyethylene glycol (MIRALAX / GLYCOLAX) 17 g packet Take 17 g by mouth daily.   Yes [provider]  Rivaroxaban (XARELTO) 15 MG TABS  tablet Take 1 tablet (15 mg total) by mouth daily. 06/28/20  Yes Loel Dubonnet, NP  timolol (TIMOPTIC) 0.5 % ophthalmic solution Place 1 drop into the right eye 2 (two) times daily.  01/01/20  Yes [provider]  amLODipine (NORVASC) 2.5 MG tablet Take 1 tablet (2.5 mg total) by mouth daily. Patient not taking: Reported on 02/11/2021 01/06/21   Kathrine Haddock, NP  Cyanocobalamin (B-12) 1000 MCG/ML KIT Inject 1,000 mcg as directed every 30 (thirty) days. Patient not taking: Reported on 02/11/2021    [provider]  predniSONE (STERAPRED UNI-PAK 21 TAB) 10 MG (21) TBPK tablet Take by mouth daily. Patient not taking: Reported on 02/11/2021    [provider]    Allergies Other, Alphagan [brimonidine], Pravachol [pravastatin sodium], and Pravastatin  Family History  Problem Relation Age of Onset   Heart attack Mother    Glaucoma Mother  Heart attack Father    Parkinson's disease Brother    Heart attack Brother     Social History Social History   Tobacco Use   Smoking status: Never   Smokeless tobacco: Never  Vaping Use   Vaping Use: Never used  Substance Use Topics   Alcohol use: No   Drug use: No    Review of Systems Constitutional: No fever. Eyes: No visual changes. ENT: No sore throat. Cardiovascular: Denies chest pain. Respiratory: Denies shortness of breath. Gastrointestinal: No nausea, vomiting.  + diarrhea. Genitourinary: Negative for dysuria. Musculoskeletal: + for back pain. Skin: Negative for rash. Neurological: Negative for focal weakness or numbness.   ____________________________________________   PHYSICAL EXAM:  VITAL SIGNS: ED Triage Vitals  Enc Vitals Group     BP 02/11/21 0037 (!) 171/97     Pulse Rate 02/11/21 0037 93     Resp 02/11/21 0037 (!) 22     Temp 02/11/21 0037 98.6 F (37 C)     Temp Source 02/11/21 0037 Oral     SpO2 02/11/21 0037 100 %     Weight --      Height --      Head Circumference --       Peak Flow --      Pain Score 02/11/21 0038 0     Pain Loc --      Pain Edu? --      Excl. in GC? --    CONSTITUTIONAL: Alert and oriented x3.  Elderly.  Repeatedly stating "I am going to fall". HEAD: Normocephalic; atraumatic EYES: Conjunctivae clear, PERRL, EOMI ENT: normal nose; no rhinorrhea; moist mucous membranes; pharynx without lesions noted; no dental injury; no septal hematoma, poor dentition, small amount of dried blood noted in the mouth, posterior pharynx appears normal, no tonsillar hypertrophy or exudate, no uvular swelling or deviation, no trismus or drooling, normal phonation NECK: Supple, no meningismus, no LAD; no midline spinal tenderness, step-off or deformity; trachea midline CARD: RRR; S1 and S2 appreciated; no murmurs, no clicks, no rubs, no gallops RESP: Normal chest excursion without splinting or tachypnea; breath sounds clear and equal bilaterally; no wheezes, no rhonchi, no rales; no hypoxia or respiratory distress CHEST:  chest wall stable, no crepitus or ecchymosis or deformity, nontender to palpation; no flail chest ABD/GI: Normal bowel sounds; non-distended; soft, non-tender, no rebound, no guarding; no ecchymosis or other lesions noted PELVIS:  stable, nontender to palpation BACK:  The back appears normal and is ender over the lumbar spine without step-off or deformity EXT: Normal ROM in all joints; non-tender to palpation; no edema; normal capillary refill; no cyanosis, no bony tenderness or bony deformity of patient's extremities, no joint effusion, compartments are soft, extremities are warm and well-perfused, no ecchymosis, no pain with ranging of the joints of the bilateral lower extremities, no leg length discrepancy SKIN: Normal color for age and race; warm NEURO: Moves all extremities equally, no facial asymmetry, normal speech PSYCH: The patient's mood and manner are appropriate. Grooming and personal hygiene are  appropriate.  ____________________________________________   LABS (all labs ordered are listed, but only abnormal results are displayed)  Labs Reviewed  CBC WITH DIFFERENTIAL/PLATELET - Abnormal; Notable for the following components:      Result Value   WBC 14.5 (*)    RBC 3.56 (*)    Hemoglobin 10.9 (*)    HCT 31.7 (*)    Neutro Abs 9.9 (*)    All other components within normal limits  COMPREHENSIVE METABOLIC PANEL - Abnormal; Notable for the following components:   Sodium 122 (*)    Chloride 91 (*)    Glucose, Bld 142 (*)    Calcium 8.8 (*)    Alkaline Phosphatase 33 (*)    Total Bilirubin 1.4 (*)    All other components within normal limits  URINALYSIS, COMPLETE (UACMP) WITH MICROSCOPIC - Abnormal; Notable for the following components:   Color, Urine YELLOW (*)    APPearance CLEAR (*)    Ketones, ur 20 (*)    All other components within normal limits  RESP PANEL BY RT-PCR (FLU A&B, COVID) ARPGX2  TROPONIN I (HIGH SENSITIVITY)  TROPONIN I (HIGH SENSITIVITY)   ____________________________________________  EKG   EKG Interpretation  Date/Time:  Friday February 11 2021 02:19:04 EDT Ventricular Rate:  91 PR Interval:  48 QRS Duration: 150 QT Interval:  422 QTC Calculation: 520 R Axis:   77 Text Interpretation: Ectopic atrial rhythm Multiform ventricular premature complexes Short PR interval Probable left ventricular hypertrophy Prolonged QT interval No significant change since last tracing Confirmed by Pryor Curia [phone]) on 02/11/2021 3:07:41 AM        ____________________________________________  RADIOLOGY Jessie Foot Merranda Bolls, personally viewed and evaluated these images (plain radiographs) as part of my medical decision making, as well as reviewing the written report by the radiologist.  ED MD interpretation: Imaging shows no acute traumatic injury.  Patient has cholelithiasis and mild proctitis.  Official radiology report(s): DG Chest 2 View  Result Date:  02/11/2021 CLINICAL DATA:  Un witnessed fall in home with chest pain, initial encounter EXAM: CHEST - 2 VIEW COMPARISON:  02/25/2019 FINDINGS: Cardiac shadow is within normal limits. Aortic calcifications are seen. The lungs are well aerated bilaterally. No focal infiltrate or sizable effusion is seen. No bony abnormality is noted. IMPRESSION: No acute abnormality noted. Electronically Signed   By: Inez Catalina M.D.   On: 02/11/2021 01:38   DG Lumbar Spine 2-3 Views  Result Date: 02/11/2021 CLINICAL DATA:  Recent fall with low back pain, initial encounter EXAM: LUMBAR SPINE - 3 VIEW COMPARISON:  06/26/2011 FINDINGS: Five lumbar type vertebral bodies are well visualized. Vertebral body height is well maintained. Multilevel disc space narrowing is noted. Mild retrolisthesis of L2 on L3 is noted. Aortic calcifications are noted. Multilevel osteophytic changes are seen. Calcification is noted to the left of the midline which may represent renal calculus. This measures approximately 8 mm. Triangular calcifications are noted in the right upper quadrant consistent with cholelithiasis. IMPRESSION: Degenerative change of the lumbar spine as described. Calcification to the left of the midline which may represent a renal calculus. Cholelithiasis is noted in the right upper quadrant. Electronically Signed   By: Inez Catalina M.D.   On: 02/11/2021 01:40   CT HEAD WO CONTRAST (5MM)  Result Date: 02/11/2021 CLINICAL DATA:  Unwitnessed fall at home EXAM: CT HEAD WITHOUT CONTRAST CT CERVICAL SPINE WITHOUT CONTRAST TECHNIQUE: Multidetector CT imaging of the head and cervical spine was performed following the standard protocol without intravenous contrast. Multiplanar CT image reconstructions of the cervical spine were also generated. COMPARISON:  None. FINDINGS: CT HEAD FINDINGS Brain: No evidence of acute infarction, hemorrhage, hydrocephalus, extra-axial collection or mass lesion/mass effect. Brain atrophy. Vascular: No  hyperdense vessel or unexpected calcification. Skull: Normal. Negative for fracture or focal lesion. Sinuses/Orbits: No visible injury CT CERVICAL SPINE FINDINGS Alignment: No traumatic malalignment Skull base and vertebrae: No acute fracture Soft tissues and spinal canal: No prevertebral fluid or swelling.  No visible canal hematoma. Disc levels:  Generalized degenerative endplate and facet spurring. Upper chest: No visible injury IMPRESSION: No evidence of acute intracranial or cervical spine injury. Electronically Signed   By: Monte Fantasia M.D.   On: 02/11/2021 04:33   CT Cervical Spine Wo Contrast  Result Date: 02/11/2021 CLINICAL DATA:  Unwitnessed fall at home EXAM: CT HEAD WITHOUT CONTRAST CT CERVICAL SPINE WITHOUT CONTRAST TECHNIQUE: Multidetector CT imaging of the head and cervical spine was performed following the standard protocol without intravenous contrast. Multiplanar CT image reconstructions of the cervical spine were also generated. COMPARISON:  None. FINDINGS: CT HEAD FINDINGS Brain: No evidence of acute infarction, hemorrhage, hydrocephalus, extra-axial collection or mass lesion/mass effect. Brain atrophy. Vascular: No hyperdense vessel or unexpected calcification. Skull: Normal. Negative for fracture or focal lesion. Sinuses/Orbits: No visible injury CT CERVICAL SPINE FINDINGS Alignment: No traumatic malalignment Skull base and vertebrae: No acute fracture Soft tissues and spinal canal: No prevertebral fluid or swelling. No visible canal hematoma. Disc levels:  Generalized degenerative endplate and facet spurring. Upper chest: No visible injury IMPRESSION: No evidence of acute intracranial or cervical spine injury. Electronically Signed   By: Monte Fantasia M.D.   On: 02/11/2021 04:33   CT ABDOMEN PELVIS W CONTRAST  Result Date: 02/11/2021 CLINICAL DATA:  Acute abdominal pain, nonlocalized. Fever and diarrhea EXAM: CT ABDOMEN AND PELVIS WITH CONTRAST TECHNIQUE: Multidetector CT imaging  of the abdomen and pelvis was performed using the standard protocol following bolus administration of intravenous contrast. CONTRAST:  30m OMNIPAQUE IOHEXOL 350 MG/ML SOLN COMPARISON:  04/11/2020 FINDINGS: Lower chest: No acute finding. Aortic and coronary atherosclerosis. Hepatobiliary: No focal liver abnormality.Full gallbladder but no superimposed inflammatory changes seen in the wall or adjacent fat. Lamellated gallstones. No bile duct dilatation. Pancreas: Unremarkable. Spleen: Unremarkable. Adrenals/Urinary Tract: Negative adrenals. No hydronephrosis or stone. Full bladder. No superimposed acute finding. Stomach/Bowel: Prominent wall thickness of the sigmoid and distal descending colon is likely from collapse. There is however some perirectal mesenteric fat stranding and accentuated rectal mucosal enhancement. No obstruction. Appendectomy. Vascular/Lymphatic: No acute vascular abnormality. Diffuse atheromatous calcification of the aorta and branch vessels. No mass or adenopathy. Reproductive:Hysterectomy Other: No ascites or pneumoperitoneum. Musculoskeletal: No acute abnormalities. Advanced lumbar spine degeneration with high-grade L3-4 and L4-5 stenosis. Remote left pubic body fracture. IMPRESSION: 1. Mild proctitis. 2. Distended urinary bladder. 3. Cholelithiasis. 4. History of fall with no visible injury. Electronically Signed   By: JMonte FantasiaM.D.   On: 02/11/2021 04:38   DG HIP UNILAT W OR W/O PELVIS 2-3 VIEWS LEFT  Result Date: 02/11/2021 CLINICAL DATA:  Recent fall with left hip pain, initial encounter EXAM: DG HIP (WITH OR WITHOUT PELVIS) 2-3V LEFT COMPARISON:  None. FINDINGS: Pelvic ring is intact. Degenerative changes of the hip joints are noted bilaterally. No acute fracture or dislocation is noted. No soft tissue abnormality is seen. IMPRESSION: Degenerative change without acute abnormality Electronically Signed   By: MInez CatalinaM.D.   On: 02/11/2021 01:41     ____________________________________________   PROCEDURES  Procedure(s) performed (including Critical Care):  Procedures  CRITICAL CARE Performed by: KCyril MourningWard   Total critical care time: 45 minutes  Critical care time was exclusive of separately billable procedures and treating other patients.  Critical care was necessary to treat or prevent imminent or life-threatening deterioration.  Critical care was time spent personally by me on the following activities: development of treatment plan with patient and/or surrogate as well as nursing, discussions with consultants,  evaluation of patient's response to treatment, examination of patient, obtaining history from patient or surrogate, ordering and performing treatments and interventions, ordering and review of laboratory studies, ordering and review of radiographic studies, pulse oximetry and re-evaluation of patient's condition.  ____________________________________________   INITIAL IMPRESSION / ASSESSMENT AND PLAN / ED COURSE  As part of my medical decision making, I reviewed the following data within the [place] History obtained from family, Nursing notes reviewed and incorporated, Labs reviewed , EKG interpreted , Old EKG reviewed, Old chart reviewed, Radiograph reviewed , Discussed with admitting physician , CTs reviewed, and Notes from prior ED visits         Patient here with unwitnessed fall in her bathroom around 615 last night.  Now having changes in mental status and complaints of left hip pain, back pain.  Feels warm to touch on my examination.  Will obtain x-rays of her back and hip, CT imaging of her head and neck.  Will give pain and nausea medicine.  Will obtain labs, EKG, urinalysis, COVID swab.  Will check a rectal temperature.  She also reports diarrhea and has history of colitis.  Abdominal exam seems benign but will obtain CT of abdomen pelvis.  ED PROGRESS  Patient's rectal  temperature 99.9.  Chest x-ray, lumbar spine x-ray and left hip x-ray show no acute abnormality.  Labs show sodium level of 122.  She does appear dry on exam.  We will start gentle hydration.   3:10 AM  On my reevaluation, patient appears more comfortable after fentanyl but is now more drowsy has slurred speech.  This could be from her narcotic pain medication but also could be due to intracranial hemorrhage given she had an unwitnessed fall tonight and is on Xarelto.  I contacted CT technician to take her promptly to CT imaging for evaluation.   4:40 AM  Pt's CT imaging shows no acute intracranial abnormality.  No other traumatic injury.  CT of the abdomen pelvis shows mild proctitis and cholelithiasis without cholecystitis.  Will discuss with hospitalist for admission for hyponatremia.   5:02 AM Discussed patient's case with hospitalist, Dr. Damita Dunnings.  I have recommended admission and patient (and family if present) agree with this plan. Admitting physician will place admission orders.   I reviewed all nursing notes, vitals, pertinent previous records and reviewed/interpreted all EKGs, lab and urine results, imaging (as available).  ____________________________________________   FINAL CLINICAL IMPRESSION(S) / ED DIAGNOSES  Final diagnoses:  Fall, initial encounter  Hyponatremia     ED Discharge Orders     None       *Please note:  Sydney Mcdaniel was evaluated in Emergency Department on 02/11/2021 for the symptoms described in the history of present illness. She was evaluated in the context of the global COVID-19 pandemic, which necessitated consideration that the patient might be at risk for infection with the SARS-CoV-2 virus that causes COVID-19. Institutional protocols and algorithms that pertain to the evaluation of patients at risk for COVID-19 are in a state of rapid change based on information released by regulatory bodies including the CDC and federal and state  organizations. These policies and algorithms were followed during the patient's care in the ED.  Some ED evaluations and interventions may be delayed as a result of limited staffing during and the pandemic.*   Note:  This document was prepared using Dragon voice recognition software and may include unintentional dictation errors.    Travia Onstad, Delice Bison, DO 02/11/21 [phone]

## 2021-02-11 NOTE — ED Notes (Signed)
Pt to CT

## 2021-02-11 NOTE — ED Notes (Signed)
Pt states she is feeling much better today.

## 2021-02-11 NOTE — ED Notes (Signed)
PT at bedside.

## 2021-02-11 NOTE — ED Triage Notes (Addendum)
C/o unwitnessed fall at home with left hip pain, back pain and possible head trauma. Pt. Had 2 teeth extracted today. Pt. Is on xarelto. Pt. States she has cough, fever and diarrhea.

## 2021-02-11 NOTE — H&P (Signed)
History and Physical    Sydney Mcdaniel EHU:[id number] DOB: Jan 11, 1925 DOA: 02/11/2021  PCP: Kathrine Haddock, NP   Patient coming from: Home  I have personally briefly reviewed patient's old medical records in [place]  Chief Complaint: Fall/confusion  HPI: Sydney Mcdaniel is a 87 y.o. female with medical history significant for CLL, hypertension, hypothyroidism, paroxysmal A. fib on anticoagulation, history of vasovagal syncope who presents to the ER via EMS for evaluation of weakness, confusion and an unwitnessed fall at home with complaints of pain in the left hip and back. Patient had 2 teeth extracted 24 hours prior to her presentation and according to the daughter she did not receive any anesthesia for the extraction.  She came home and was in her usual state of health until later in the evening when her life alert went off because she had fallen in her bedroom and was unable to get up.  Has son and daughter went to the house and were able to assist her back to bed.  Patient was extremely weak and unable to ambulate.  Her daughter got concerned when she became confused and was talking about things that were not present.  She called EMS and patient was brought to the ER for further evaluation. Patient does have a history of vasovagal syncope but according to the daughter she is usually diaphoretic prior to those episodes but that did not happen. During my evaluation patient is more awake, alert and oriented to person place and time.  She does not remember the events of the night before and cannot tell me why she fell. She has had a cough and diarrhea but denies having any shortness of breath, no chest pain, no dizziness, no lightheadedness, no urinary frequency, no nocturia, no dysuria, no headache, no leg swelling, no focal deficits or blurred vision. She has not had excessive bleeding from the site of extraction. She is able to move all her extremities. Labs show sodium 122,  potassium 3.6, chloride 91, bicarb 22, glucose 142, BUN 12, creatinine 0.71, calcium 8.8, alkaline phosphatase 33, albumin 3.9, AST 25, ALT 19, total protein 7.1, troponin 17 >> 23, white count 14.5, hemoglobin 10.9, hematocrit 31.7, MCV 89, RDW 14.1, platelet count 196 Respiratory viral panel is negative Urine analysis is sterile CT scan of the head without contrast/CT scan cervical spine shows no evidence of acute infarction, hemorrhage, hydrocephalus, extra-axial collection or mass lesion.  Brain atrophy No evidence of acute intracranial or cervical spine injury CT scan of abdomen and pelvis shows mild proctitis, distended urinary bladder, cholelithiasis Twelve-lead EKG reviewed by me shows an ectopic atrial rhythm, PVCs and short PR interval.    ED Course: Patient is a 87 year old female who was brought into the ER by EMS for evaluation after she had an unwitnessed fall at home as well as acute onset confusion. Labs reveal hyponatremia. Patient will be admitted to the hospital for further evaluation.   Review of Systems: As per HPI otherwise all other systems reviewed and negative.    Past Medical History:  Diagnosis Date   Allergy    Anemia    Cataract    CLL (chronic lymphocytic leukemia) (HCC)    GERD (gastroesophageal reflux disease)    Glaucoma    H/O: hysterectomy    Total   Hyperlipidemia    Hypertension    Hypothyroidism    Impaired fasting glucose    Lichen sclerosus    Osteoporosis    Hips   PAF (paroxysmal  atrial fibrillation) ([place])    a. 06/15/2016 Event monitor: 4% afib burden; b. CHA2DS2VASc - 4-->Xarelto.   Syncope    a. 03/2016 Echo: EF55-60%, no rwma, mild AI/MR, nl PASP; b. 03/2016 48h Holter: no significant arrhythmias/pauses; c. 03/2016 MV: mild apical defect, likely breast attenuation, nl EF, low risk; d. 05/2016 Event monitor: No significant arrhythmia; e. 05/2016 Event monitor: PAF (4%).    Past Surgical History:  Procedure Laterality Date    APPENDECTOMY     EYE SURGERY     Glaucoma   TOTAL ABDOMINAL HYSTERECTOMY       reports that she has never smoked. She has never used smokeless tobacco. She reports that she does not drink alcohol and does not use drugs.  Allergies  Allergen Reactions   Other    Alphagan [Brimonidine] Itching and Rash   Pravachol [Pravastatin Sodium] Itching and Rash   Pravastatin Itching, Other (See Comments) and Rash    Family History  Problem Relation Age of Onset   Heart attack Mother    Glaucoma Mother    Heart attack Father    Parkinson's disease Brother    Heart attack Brother       Prior to Admission medications   Medication Sig Start Date End Date Taking? Authorizing Provider  alendronate (FOSAMAX) 70 MG tablet TAKE ONE TABLET BY MOUTH EVERY 7 DAYS. TAKE WITH A FULL GLASS OF WATER ON AN EMPTY STOMACH. 06/08/20  Yes Delsa Grana, PA-C  amoxicillin (AMOXIL) 500 MG capsule Take 500 mg by mouth 3 (three) times daily. 02/10/21  Yes [provider]  atorvastatin (LIPITOR) 40 MG tablet Take 1 tablet (40 mg total) by mouth daily. 11/24/20 02/22/21 Yes End, Harrell Gave, MD  Calcium Carb-Cholecalciferol (CALCIUM 600 + D PO) Take 1 tablet by mouth daily.    Yes [provider]  cholecalciferol (VITAMIN D) 1000 units tablet Take 1,000 Units by mouth daily.   Yes [provider]  famotidine (PEPCID) 20 MG tablet Take 20 mg by mouth daily.   Yes [provider]  ferrous sulfate 325 (65 FE) MG tablet Take 325 mg by mouth 2 (two) times daily with a meal.   Yes [provider]  latanoprost (XALATAN) 0.005 % ophthalmic solution Place 1 drop into the right eye at bedtime.  04/24/15  Yes [provider]  levothyroxine (EUTHYROX) 75 MCG tablet TAKE ONE TABLET BY MOUTH ON MONDAYS, WEDNESDAYS, FRIDAYS, AND SUNDAYS 04/08/20  Yes Delsa Grana, PA-C  levothyroxine (EUTHYROX) 88 MCG tablet TAKE 1 TABLET BY MOUTH ON TUESDAYS, THURSDAYS, AND SATURDAYS 04/08/20  Yes  Lucio Edward, Leisa, PA-C  lisinopril (ZESTRIL) 20 MG tablet Take 1 tablet (20 mg total) by mouth daily. 05/19/20  Yes Loel Dubonnet, NP  Multiple Vitamins-Minerals (MULTIVITAMIN ADULT PO) Take 1 tablet by mouth daily.   Yes [provider]  Omega-3 Fatty Acids (FISH OIL) 1200 MG CAPS Take 1,200 mg by mouth daily.    Yes [provider]  polyethylene glycol (MIRALAX / GLYCOLAX) 17 g packet Take 17 g by mouth daily.   Yes [provider]  Rivaroxaban (XARELTO) 15 MG TABS tablet Take 1 tablet (15 mg total) by mouth daily. 06/28/20  Yes Loel Dubonnet, NP  timolol (TIMOPTIC) 0.5 % ophthalmic solution Place 1 drop into the right eye 2 (two) times daily.  01/01/20  Yes [provider]  amLODipine (NORVASC) 2.5 MG tablet Take 1 tablet (2.5 mg total) by mouth daily. Patient not taking: Reported on 02/11/2021 01/06/21  Kathrine Haddock, NP  Cyanocobalamin (B-12) 1000 MCG/ML KIT Inject 1,000 mcg as directed every 30 (thirty) days. Patient not taking: Reported on 02/11/2021    [provider]  predniSONE (STERAPRED UNI-PAK 21 TAB) 10 MG (21) TBPK tablet Take by mouth daily. Patient not taking: Reported on 02/11/2021    [provider]    Physical Exam: Vitals:   02/11/21 0930 02/11/21 1000 02/11/21 1030 02/11/21 1039  BP: (!) 150/56 (!) 154/54 (!) 141/67   Pulse: 79 82 83   Resp: '14 16 14   ' Temp:      TempSrc:      SpO2: 96% 98% 99%   Weight:    68 kg  Height:    '5\' 2"'  (1.575 m)     Vitals:   02/11/21 0930 02/11/21 1000 02/11/21 1030 02/11/21 1039  BP: (!) 150/56 (!) 154/54 (!) 141/67   Pulse: 79 82 83   Resp: '14 16 14   ' Temp:      TempSrc:      SpO2: 96% 98% 99%   Weight:    68 kg  Height:    '5\' 2"'  (1.575 m)      Constitutional: Alert and oriented x 3 . Not in any apparent distress HEENT:      Head: Normocephalic and atraumatic.         Eyes: PERLA, EOMI, Conjunctivae are normal. Sclera is non-icteric.       Mouth/Throat: Mucous  membranes are moist.       Neck: Supple with no signs of meningismus. Cardiovascular: Regular rate and rhythm. No murmurs, gallops, or rubs. 2+ symmetrical distal pulses are present . No JVD. No LE edema Respiratory: Respiratory effort normal .Lungs sounds clear bilaterally. No wheezes, crackles, or rhonchi.  Gastrointestinal: Soft, non tender, and non distended with positive bowel sounds.  Genitourinary: No CVA tenderness. Musculoskeletal: Nontender with normal range of motion in all extremities. No cyanosis, or erythema of extremities. Neurologic:  Face is symmetric. Moving all extremities. No gross focal neurologic deficits . Skin: Skin is warm, dry.  No rash or ulcers Psychiatric: Mood and affect are normal    Labs on Admission: I have personally reviewed following labs and imaging studies  CBC: Recent Labs  Lab 02/11/21 0220  WBC 14.5*  NEUTROABS 9.9*  HGB 10.9*  HCT 31.7*  MCV 89.0  PLT 329   Basic Metabolic Panel: Recent Labs  Lab 02/11/21 0220  NA 122*  K 3.6  CL 91*  CO2 22  GLUCOSE 142*  BUN 12  CREATININE 0.71  CALCIUM 8.8*   GFR: Estimated Creatinine Clearance: 39.7 mL/min (by C-G formula based on SCr of 0.71 mg/dL). Liver Function Tests: Recent Labs  Lab 02/11/21 0220  AST 25  ALT 19  ALKPHOS 33*  BILITOT 1.4*  PROT 7.1  ALBUMIN 3.9   No results for input(s): LIPASE, AMYLASE in the last 168 hours. No results for input(s): AMMONIA in the last 168 hours. Coagulation Profile: No results for input(s): INR, PROTIME in the last 168 hours. Cardiac Enzymes: No results for input(s): CKTOTAL, CKMB, CKMBINDEX, TROPONINI in the last 168 hours. BNP (last 3 results) No results for input(s): PROBNP in the last 8760 hours. HbA1C: No results for input(s): HGBA1C in the last 72 hours. CBG: No results for input(s): GLUCAP in the last 168 hours. Lipid Profile: No results for input(s): CHOL, HDL, LDLCALC, TRIG, CHOLHDL, LDLDIRECT in the last 72 hours. Thyroid  Function Tests: No results for input(s): TSH, T4TOTAL, FREET4,  T3FREE, THYROIDAB in the last 72 hours. Anemia Panel: No results for input(s): VITAMINB12, FOLATE, FERRITIN, TIBC, IRON, RETICCTPCT in the last 72 hours. Urine analysis:    Component Value Date/Time   COLORURINE YELLOW (A) 02/11/2021 0332   APPEARANCEUR CLEAR (A) 02/11/2021 0332   LABSPEC 1.008 02/11/2021 0332   PHURINE 7.0 02/11/2021 0332   GLUCOSEU NEGATIVE 02/11/2021 0332   HGBUR NEGATIVE 02/11/2021 0332   BILIRUBINUR NEGATIVE 02/11/2021 0332   BILIRUBINUR neg 02/25/2016 1013   KETONESUR 20 (A) 02/11/2021 0332   PROTEINUR NEGATIVE 02/11/2021 0332   UROBILINOGEN 0.2 02/25/2016 1013   NITRITE NEGATIVE 02/11/2021 0332   LEUKOCYTESUR NEGATIVE 02/11/2021 0332    Radiological Exams on Admission: DG Chest 2 View  Result Date: 02/11/2021 CLINICAL DATA:  Un witnessed fall in home with chest pain, initial encounter EXAM: CHEST - 2 VIEW COMPARISON:  02/25/2019 FINDINGS: Cardiac shadow is within normal limits. Aortic calcifications are seen. The lungs are well aerated bilaterally. No focal infiltrate or sizable effusion is seen. No bony abnormality is noted. IMPRESSION: No acute abnormality noted. Electronically Signed   By: Inez Catalina M.D.   On: 02/11/2021 01:38   DG Lumbar Spine 2-3 Views  Result Date: 02/11/2021 CLINICAL DATA:  Recent fall with low back pain, initial encounter EXAM: LUMBAR SPINE - 3 VIEW COMPARISON:  06/26/2011 FINDINGS: Five lumbar type vertebral bodies are well visualized. Vertebral body height is well maintained. Multilevel disc space narrowing is noted. Mild retrolisthesis of L2 on L3 is noted. Aortic calcifications are noted. Multilevel osteophytic changes are seen. Calcification is noted to the left of the midline which may represent renal calculus. This measures approximately 8 mm. Triangular calcifications are noted in the right upper quadrant consistent with cholelithiasis. IMPRESSION: Degenerative  change of the lumbar spine as described. Calcification to the left of the midline which may represent a renal calculus. Cholelithiasis is noted in the right upper quadrant. Electronically Signed   By: Inez Catalina M.D.   On: 02/11/2021 01:40   CT HEAD WO CONTRAST (5MM)  Result Date: 02/11/2021 CLINICAL DATA:  Unwitnessed fall at home EXAM: CT HEAD WITHOUT CONTRAST CT CERVICAL SPINE WITHOUT CONTRAST TECHNIQUE: Multidetector CT imaging of the head and cervical spine was performed following the standard protocol without intravenous contrast. Multiplanar CT image reconstructions of the cervical spine were also generated. COMPARISON:  None. FINDINGS: CT HEAD FINDINGS Brain: No evidence of acute infarction, hemorrhage, hydrocephalus, extra-axial collection or mass lesion/mass effect. Brain atrophy. Vascular: No hyperdense vessel or unexpected calcification. Skull: Normal. Negative for fracture or focal lesion. Sinuses/Orbits: No visible injury CT CERVICAL SPINE FINDINGS Alignment: No traumatic malalignment Skull base and vertebrae: No acute fracture Soft tissues and spinal canal: No prevertebral fluid or swelling. No visible canal hematoma. Disc levels:  Generalized degenerative endplate and facet spurring. Upper chest: No visible injury IMPRESSION: No evidence of acute intracranial or cervical spine injury. Electronically Signed   By: Monte Fantasia M.D.   On: 02/11/2021 04:33   CT Cervical Spine Wo Contrast  Result Date: 02/11/2021 CLINICAL DATA:  Unwitnessed fall at home EXAM: CT HEAD WITHOUT CONTRAST CT CERVICAL SPINE WITHOUT CONTRAST TECHNIQUE: Multidetector CT imaging of the head and cervical spine was performed following the standard protocol without intravenous contrast. Multiplanar CT image reconstructions of the cervical spine were also generated. COMPARISON:  None. FINDINGS: CT HEAD FINDINGS Brain: No evidence of acute infarction, hemorrhage, hydrocephalus, extra-axial collection or mass lesion/mass  effect. Brain atrophy. Vascular: No hyperdense vessel or unexpected calcification. Skull:  Normal. Negative for fracture or focal lesion. Sinuses/Orbits: No visible injury CT CERVICAL SPINE FINDINGS Alignment: No traumatic malalignment Skull base and vertebrae: No acute fracture Soft tissues and spinal canal: No prevertebral fluid or swelling. No visible canal hematoma. Disc levels:  Generalized degenerative endplate and facet spurring. Upper chest: No visible injury IMPRESSION: No evidence of acute intracranial or cervical spine injury. Electronically Signed   By: Monte Fantasia M.D.   On: 02/11/2021 04:33   CT ABDOMEN PELVIS W CONTRAST  Result Date: 02/11/2021 CLINICAL DATA:  Acute abdominal pain, nonlocalized. Fever and diarrhea EXAM: CT ABDOMEN AND PELVIS WITH CONTRAST TECHNIQUE: Multidetector CT imaging of the abdomen and pelvis was performed using the standard protocol following bolus administration of intravenous contrast. CONTRAST:  47m OMNIPAQUE IOHEXOL 350 MG/ML SOLN COMPARISON:  04/11/2020 FINDINGS: Lower chest: No acute finding. Aortic and coronary atherosclerosis. Hepatobiliary: No focal liver abnormality.Full gallbladder but no superimposed inflammatory changes seen in the wall or adjacent fat. Lamellated gallstones. No bile duct dilatation. Pancreas: Unremarkable. Spleen: Unremarkable. Adrenals/Urinary Tract: Negative adrenals. No hydronephrosis or stone. Full bladder. No superimposed acute finding. Stomach/Bowel: Prominent wall thickness of the sigmoid and distal descending colon is likely from collapse. There is however some perirectal mesenteric fat stranding and accentuated rectal mucosal enhancement. No obstruction. Appendectomy. Vascular/Lymphatic: No acute vascular abnormality. Diffuse atheromatous calcification of the aorta and branch vessels. No mass or adenopathy. Reproductive:Hysterectomy Other: No ascites or pneumoperitoneum. Musculoskeletal: No acute abnormalities. Advanced lumbar  spine degeneration with high-grade L3-4 and L4-5 stenosis. Remote left pubic body fracture. IMPRESSION: 1. Mild proctitis. 2. Distended urinary bladder. 3. Cholelithiasis. 4. History of fall with no visible injury. Electronically Signed   By: JMonte FantasiaM.D.   On: 02/11/2021 04:38   DG HIP UNILAT W OR W/O PELVIS 2-3 VIEWS LEFT  Result Date: 02/11/2021 CLINICAL DATA:  Recent fall with left hip pain, initial encounter EXAM: DG HIP (WITH OR WITHOUT PELVIS) 2-3V LEFT COMPARISON:  None. FINDINGS: Pelvic ring is intact. Degenerative changes of the hip joints are noted bilaterally. No acute fracture or dislocation is noted. No soft tissue abnormality is seen. IMPRESSION: Degenerative change without acute abnormality Electronically Signed   By: MInez CatalinaM.D.   On: 02/11/2021 01:41     Assessment/Plan Principal Problem:   Hyponatremia Active Problems:   Essential hypertension   Hypothyroidism   CLL (chronic lymphocytic leukemia) (HCC)   Paroxysmal atrial fibrillation (HCC)   MCI (mild cognitive impairment)   Fall      Symptomatic hyponatremia As evidenced by transient confusion, weakness and a fall Will obtain serum and urine osmolality as well as urine sodium levels Continue gentle IV fluid hydration with normal saline Nephrology consult    Paroxysmal atrial fibrillation Continue Xarelto as primary prophylaxis for an acute stroke    Hypertension Blood pressure is stable Continue lisinopril    Hypothyroidism Continue Synthroid    History of CLL Stable    Fall We will request PT evaluation Place patient on fall precautions  DVT prophylaxis: Xarelto Code Status: DO NOT RESUSCITATE Family Communication: Greater than 50% of time was spent discussing patient's condition and plan of care with her and her daughter at the bedside.  All questions and concerns have been addressed.  They verbalized understanding and agree with the plan.  CODE STATUS was discussed and she  is a DNR. Disposition Plan: Back to previous home environment Consults called: Nephrology/physical therapy Status: At the time of admission, it appears that the appropriate admission status for  this patient is inpatient. This is judged to be reasonable and necessary in order to provide the required intensity of service to ensure the patient's safety given the presenting symptoms, physical exam findings, and initial radiographic and laboratory data in the context of their comorbid conditions. Patient requires inpatient status due to high intensity of service, high risk for further deterioration and high frequency of surveillance required.    Collier Bullock MD [facility]     02/11/2021, 10:51 AM

## 2021-02-11 NOTE — Consult Note (Signed)
[facility] CONSULT NOTE    Date: 02/11/2021                  Patient Name:  Sydney Mcdaniel  MRN: [id number]  DOB: [birth year]  Age / Sex: 87 y.o., female         PCP: Kathrine Haddock, NP                 Service Requesting Consult: Hospitalist                 Reason for Consult: Hyponatremia            History of Present Illness: Patient is a 87 y.o. female with a PMHx of CLL, hypothyroidism, proximal atrial fibrillation on anticoagulation, history of vasovagal syncope, hyperlipidemia, osteoporosis, GERD, glaucoma who was admitted to [facility] on 02/11/2021 for evaluation of confusion and altered mental status.  Most of the history is offered by the patient's daughter.  She apparently had 2 teeth pulled yesterday.  Was not n.p.o. prior to the procedure.  Thereafter she sustained a fall in the evening in her bathroom.  She was subsequently brought here.  Found to have significantly low sodium of 122.  Normal renal function at the moment.  Patient does not have history of diuretic use.  CT head showed brain atrophy but no evidence of acute infarction or hemorrhage.  Upon arrival here patient was started on IV fluid hydration with 0.9 normal saline.  Sodium did rise to 126.  Serum osmolality found to be 266.  In addition patient does have prior history of subcentimeter pulmonary nodules.   Medications: Outpatient medications: Medications Prior to Admission  Medication Sig Dispense Refill Last Dose   alendronate (FOSAMAX) 70 MG tablet TAKE ONE TABLET BY MOUTH EVERY 7 DAYS. TAKE WITH A FULL GLASS OF WATER ON AN EMPTY STOMACH. 12 tablet 3    amoxicillin (AMOXIL) 500 MG capsule Take 500 mg by mouth 3 (three) times daily.   02/10/2021 at 1700   atorvastatin (LIPITOR) 40 MG tablet Take 1 tablet (40 mg total) by mouth daily. 90 tablet 1 02/09/2021 at 2230   Calcium Carb-Cholecalciferol (CALCIUM 600 + D PO) Take 1 tablet by mouth daily.    02/10/2021 at 0800   cholecalciferol  (VITAMIN D) 1000 units tablet Take 1,000 Units by mouth daily.   02/10/2021 at 0800   famotidine (PEPCID) 20 MG tablet Take 20 mg by mouth daily.   02/09/2021 at 2230   ferrous sulfate 325 (65 FE) MG tablet Take 325 mg by mouth 2 (two) times daily with a meal.   02/10/2021 at 0800   latanoprost (XALATAN) 0.005 % ophthalmic solution Place 1 drop into the right eye at bedtime.    02/09/2021 at 2230   levothyroxine (EUTHYROX) 75 MCG tablet TAKE ONE TABLET BY MOUTH ON MONDAYS, WEDNESDAYS, FRIDAYS, AND SUNDAYS 52 tablet 2 02/09/2021 at 0700   levothyroxine (EUTHYROX) 88 MCG tablet TAKE 1 TABLET BY MOUTH ON TUESDAYS, THURSDAYS, AND SATURDAYS 39 tablet 2 02/10/2021 at 0700   lisinopril (ZESTRIL) 20 MG tablet Take 1 tablet (20 mg total) by mouth daily. 90 tablet 1 02/10/2021 at 0800   Multiple Vitamins-Minerals (MULTIVITAMIN ADULT PO) Take 1 tablet by mouth daily.   02/10/2021 at 0800   Omega-3 Fatty Acids (FISH OIL) 1200 MG CAPS Take 1,200 mg by mouth daily.    02/10/2021 at 0800   polyethylene glycol (MIRALAX / GLYCOLAX) 17 g packet Take 17 g by mouth daily.  02/10/2021 at 1200   Rivaroxaban (XARELTO) 15 MG TABS tablet Take 1 tablet (15 mg total) by mouth daily. 90 tablet 2 02/10/2021 at 1700   timolol (TIMOPTIC) 0.5 % ophthalmic solution Place 1 drop into the right eye 2 (two) times daily.    02/10/2021 at 0800   amLODipine (NORVASC) 2.5 MG tablet Take 1 tablet (2.5 mg total) by mouth daily. (Patient not taking: Reported on 02/11/2021) 90 tablet 0 Not Taking   Cyanocobalamin (B-12) 1000 MCG/ML KIT Inject 1,000 mcg as directed every 30 (thirty) days. (Patient not taking: Reported on 02/11/2021)   Not Taking   predniSONE (STERAPRED UNI-PAK 21 TAB) 10 MG (21) TBPK tablet Take by mouth daily. (Patient not taking: Reported on 02/11/2021)   Not Taking    Current medications: Current Facility-Administered Medications  Medication Dose Route Frequency Provider Last Rate Last Admin   0.9 %  sodium chloride infusion  250 mL  Intravenous PRN Agbata, Tochukwu, MD       0.9 %  sodium chloride infusion   Intravenous Continuous Agbata, Tochukwu, MD 50 mL/hr at 02/11/21 1631 New Bag at 02/11/21 1631   acetaminophen (TYLENOL) tablet 650 mg  650 mg Oral Q6H PRN Agbata, Tochukwu, MD       Or   acetaminophen (TYLENOL) suppository 650 mg  650 mg Rectal Q6H PRN Agbata, Tochukwu, MD       amoxicillin (AMOXIL) capsule 500 mg  500 mg Oral TID Agbata, Tochukwu, MD   500 mg at 02/11/21 1102   atorvastatin (LIPITOR) tablet 40 mg  40 mg Oral QPM Agbata, Tochukwu, MD       calcium-vitamin D (OSCAL WITH D) 500-200 MG-UNIT per tablet   Oral Daily Agbata, Tochukwu, MD   1 tablet at 02/11/21 1101   cholecalciferol (VITAMIN D3) tablet 1,000 Units  1,000 Units Oral Daily Agbata, Tochukwu, MD   1,000 Units at 02/11/21 1101   famotidine (PEPCID) tablet 20 mg  20 mg Oral Daily Agbata, Tochukwu, MD   20 mg at 02/11/21 1101   ferrous sulfate tablet 325 mg  325 mg Oral BID WC Agbata, Tochukwu, MD       latanoprost (XALATAN) 0.005 % ophthalmic solution 1 drop  1 drop Right Eye QHS Agbata, Tochukwu, MD       [START ON 02/12/2021] levothyroxine (SYNTHROID) tablet 88 mcg  88 mcg Oral Q0600 Agbata, Tochukwu, MD       lisinopril (ZESTRIL) tablet 20 mg  20 mg Oral Daily Agbata, Tochukwu, MD   20 mg at 02/11/21 1101   multivitamin with minerals tablet   Oral Daily Agbata, Tochukwu, MD   1 tablet at 02/11/21 1103   omega-3 acid ethyl esters (LOVAZA) capsule 1 g  1 g Oral Daily Agbata, Tochukwu, MD   1 g at 02/11/21 1103   ondansetron (ZOFRAN) tablet 4 mg  4 mg Oral Q6H PRN Agbata, Tochukwu, MD       Or   ondansetron (ZOFRAN) injection 4 mg  4 mg Intravenous Q6H PRN Agbata, Tochukwu, MD       polyethylene glycol (MIRALAX / GLYCOLAX) packet 17 g  17 g Oral Daily Agbata, Tochukwu, MD   17 g at 02/11/21 1100   Rivaroxaban (XARELTO) tablet 15 mg  15 mg Oral Q supper Agbata, Tochukwu, MD       sodium chloride flush (NS) 0.9 % injection 3 mL  3 mL Intravenous Q12H  Agbata, Tochukwu, MD       sodium chloride flush (NS) 0.9 %  injection 3 mL  3 mL Intravenous PRN Agbata, Tochukwu, MD       timolol (TIMOPTIC) 0.5 % ophthalmic solution 1 drop  1 drop Right Eye BID Agbata, Tochukwu, MD          Allergies: Allergies  Allergen Reactions   Other    Alphagan [Brimonidine] Itching and Rash   Pravachol [Pravastatin Sodium] Itching and Rash   Pravastatin Itching, Other (See Comments) and Rash      Past Medical History: Past Medical History:  Diagnosis Date   Allergy    Anemia    Cataract    CLL (chronic lymphocytic leukemia) (HCC)    GERD (gastroesophageal reflux disease)    Glaucoma    H/O: hysterectomy    Total   Hyperlipidemia    Hypertension    Hypothyroidism    Impaired fasting glucose    Lichen sclerosus    Osteoporosis    Hips   PAF (paroxysmal atrial fibrillation) ([place])    a. 06/15/2016 Event monitor: 4% afib burden; b. CHA2DS2VASc - 4-->Xarelto.   Syncope    a. 03/2016 Echo: EF55-60%, no rwma, mild AI/MR, nl PASP; b. 03/2016 48h Holter: no significant arrhythmias/pauses; c. 03/2016 MV: mild apical defect, likely breast attenuation, nl EF, low risk; d. 05/2016 Event monitor: No significant arrhythmia; e. 05/2016 Event monitor: PAF (4%).     Past Surgical History: Past Surgical History:  Procedure Laterality Date   APPENDECTOMY     EYE SURGERY     Glaucoma   TOTAL ABDOMINAL HYSTERECTOMY       Family History: Family History  Problem Relation Age of Onset   Heart attack Mother    Glaucoma Mother    Heart attack Father    Parkinson's disease Brother    Heart attack Brother      Social History: Social History   Socioeconomic History   Marital status: Widowed    Spouse name: Not on file   Number of children: 2   Years of education: Not on file   Highest education level: High school graduate  Occupational History   Occupation: retired  Tobacco Use   Smoking status: Never   Smokeless tobacco: Never  Vaping Use    Vaping Use: Never used  Substance and Sexual Activity   Alcohol use: No   Drug use: No   Sexual activity: Not Currently  Other Topics Concern   Not on file  Social History Narrative   Pt lives alone.    Social Determinants of Health   Financial Resource Strain: Low Risk    Difficulty of Paying Living Expenses: Not hard at all  Food Insecurity: No Food Insecurity   Worried About Charity fundraiser in the Last Year: Never true   [place] in the Last Year: Never true  Transportation Needs: No Transportation Needs   Lack of Transportation (Medical): No   Lack of Transportation (Non-Medical): No  Physical Activity: Inactive   Days of Exercise per Week: 0 days   Minutes of Exercise per Session: 0 min  Stress: No Stress Concern Present   Feeling of Stress : Not at all  Social Connections: Moderately Isolated   Frequency of Communication with Friends and Family: More than three times a week   Frequency of Social Gatherings with Friends and Family: More than three times a week   Attends Religious Services: More than 4 times per year   Active Member of [employer] or Organizations: No   Attends Archivist  Meetings: Never   Marital Status: Widowed  Human resources officer Violence: Not At Risk   Fear of Current or Ex-Partner: No   Emotionally Abused: No   Physically Abused: No   Sexually Abused: No     Review of Systems: Patient unable to offer accurate review of systems due to confusion.  Vital Signs: Blood pressure (!) 145/82, pulse 72, temperature 98.5 F (36.9 C), resp. rate 16, height _0  (1.575 m), weight 68 kg, SpO2 100 %.  Weight trends: Filed Weights   02/11/21 1039  Weight: 68 kg     Physical Exam: General: No acute distress  Head: Normocephalic, atraumatic. Moist oral mucosal membranes  Eyes: Anicteric  Neck: Supple  Lungs:  Clear to auscultation, normal effort  Heart: S1S2 no rubs  Abdomen:  Soft, nontender, bowel sounds present  Extremities: No  peripheral edema.  Neurologic: Awake, alert, confused  Skin: No acute rash  Access: No dialysis access    Lab results: Basic Metabolic Panel: Recent Labs  Lab 02/11/21 0220 02/11/21 1113  NA 122* 126*  K 3.6  --   CL 91*  --   CO2 22  --   GLUCOSE 142*  --   BUN 12  --   CREATININE 0.71  --   CALCIUM 8.8*  --     Liver Function Tests: Recent Labs  Lab 02/11/21 0220  AST 25  ALT 19  ALKPHOS 33*  BILITOT 1.4*  PROT 7.1  ALBUMIN 3.9   No results for input(s): LIPASE, AMYLASE in the last 168 hours. No results for input(s): AMMONIA in the last 168 hours.  CBC: Recent Labs  Lab 02/11/21 0220  WBC 14.5*  NEUTROABS 9.9*  HGB 10.9*  HCT 31.7*  MCV 89.0  PLT 196    Cardiac Enzymes: No results for input(s): CKTOTAL, CKMB, CKMBINDEX, TROPONINI in the last 168 hours.  BNP: Invalid input(s): POCBNP  CBG: No results for input(s): GLUCAP in the last 168 hours.  Microbiology: Results for orders placed or performed during the hospital encounter of 02/11/21  Resp Panel by RT-PCR (Flu A&B, Covid) Nasopharyngeal Swab     Status: None   Collection Time: 02/11/21  2:22 AM   Specimen: Nasopharyngeal Swab; Nasopharyngeal(NP) swabs in vial transport medium  Result Value Ref Range Status   SARS Coronavirus 2 by RT PCR NEGATIVE NEGATIVE Final    Comment: (NOTE) SARS-CoV-2 target nucleic acids are NOT DETECTED.  The SARS-CoV-2 RNA is generally detectable in upper respiratory specimens during the acute phase of infection. The lowest concentration of SARS-CoV-2 viral copies this assay can detect is 138 copies/mL. A negative result does not preclude SARS-Cov-2 infection and should not be used as the sole basis for treatment or other patient management decisions. A negative result may occur with  improper specimen collection/handling, submission of specimen other than nasopharyngeal swab, presence of viral mutation(s) within the areas targeted by this assay, and inadequate  number of viral copies(<138 copies/mL). A negative result must be combined with clinical observations, patient history, and epidemiological information. The expected result is Negative.  Fact Sheet for Patients:  EntrepreneurPulse.com.au  Fact Sheet for Healthcare Providers:  IncredibleEmployment.be  This test is no t yet approved or cleared by the [place] FDA and  has been authorized for detection and/or diagnosis of SARS-CoV-2 by FDA under an Emergency Use Authorization (EUA). This EUA will remain  in effect (meaning this test can be used) for the duration of the COVID-19 declaration under Section 564(b)(1) of the  Act, 21 U.S.C.section 360bbb-3(b)(1), unless the authorization is terminated  or revoked sooner.       Influenza A by PCR NEGATIVE NEGATIVE Final   Influenza B by PCR NEGATIVE NEGATIVE Final    Comment: (NOTE) The Xpert Xpress SARS-CoV-2/FLU/RSV plus assay is intended as an aid in the diagnosis of influenza from Nasopharyngeal swab specimens and should not be used as a sole basis for treatment. Nasal washings and aspirates are unacceptable for Xpert Xpress SARS-CoV-2/FLU/RSV testing.  Fact Sheet for Patients: EntrepreneurPulse.com.au  Fact Sheet for Healthcare Providers: IncredibleEmployment.be  This test is not yet approved or cleared by the [place] FDA and has been authorized for detection and/or diagnosis of SARS-CoV-2 by FDA under an Emergency Use Authorization (EUA). This EUA will remain in effect (meaning this test can be used) for the duration of the COVID-19 declaration under Section 564(b)(1) of the Act, 21 U.S.C. section 360bbb-3(b)(1), unless the authorization is terminated or revoked.  Performed at [facility] [place], [place] [address]     Coagulation Studies: No results for input(s): LABPROT, INR in the last 72  hours.  Urinalysis: Recent Labs    02/11/21 0332  COLORURINE YELLOW*  LABSPEC 1.008  PHURINE 7.0  GLUCOSEU NEGATIVE  HGBUR NEGATIVE  BILIRUBINUR NEGATIVE  KETONESUR 20*  PROTEINUR NEGATIVE  NITRITE NEGATIVE  LEUKOCYTESUR NEGATIVE      Imaging: DG Chest 2 View  Result Date: 02/11/2021 CLINICAL DATA:  Un witnessed fall in home with chest pain, initial encounter EXAM: CHEST - 2 VIEW COMPARISON:  02/25/2019 FINDINGS: Cardiac shadow is within normal limits. Aortic calcifications are seen. The lungs are well aerated bilaterally. No focal infiltrate or sizable effusion is seen. No bony abnormality is noted. IMPRESSION: No acute abnormality noted. Electronically Signed   By: Inez Catalina M.D.   On: 02/11/2021 01:38   DG Lumbar Spine 2-3 Views  Result Date: 02/11/2021 CLINICAL DATA:  Recent fall with low back pain, initial encounter EXAM: LUMBAR SPINE - 3 VIEW COMPARISON:  06/26/2011 FINDINGS: Five lumbar type vertebral bodies are well visualized. Vertebral body height is well maintained. Multilevel disc space narrowing is noted. Mild retrolisthesis of L2 on L3 is noted. Aortic calcifications are noted. Multilevel osteophytic changes are seen. Calcification is noted to the left of the midline which may represent renal calculus. This measures approximately 8 mm. Triangular calcifications are noted in the right upper quadrant consistent with cholelithiasis. IMPRESSION: Degenerative change of the lumbar spine as described. Calcification to the left of the midline which may represent a renal calculus. Cholelithiasis is noted in the right upper quadrant. Electronically Signed   By: Inez Catalina M.D.   On: 02/11/2021 01:40   CT HEAD WO CONTRAST (5MM)  Result Date: 02/11/2021 CLINICAL DATA:  Unwitnessed fall at home EXAM: CT HEAD WITHOUT CONTRAST CT CERVICAL SPINE WITHOUT CONTRAST TECHNIQUE: Multidetector CT imaging of the head and cervical spine was performed following the standard protocol without  intravenous contrast. Multiplanar CT image reconstructions of the cervical spine were also generated. COMPARISON:  None. FINDINGS: CT HEAD FINDINGS Brain: No evidence of acute infarction, hemorrhage, hydrocephalus, extra-axial collection or mass lesion/mass effect. Brain atrophy. Vascular: No hyperdense vessel or unexpected calcification. Skull: Normal. Negative for fracture or focal lesion. Sinuses/Orbits: No visible injury CT CERVICAL SPINE FINDINGS Alignment: No traumatic malalignment Skull base and vertebrae: No acute fracture Soft tissues and spinal canal: No prevertebral fluid or swelling. No visible canal hematoma. Disc levels:  Generalized degenerative endplate and facet spurring.  Upper chest: No visible injury IMPRESSION: No evidence of acute intracranial or cervical spine injury. Electronically Signed   By: Monte Fantasia M.D.   On: 02/11/2021 04:33   CT Cervical Spine Wo Contrast  Result Date: 02/11/2021 CLINICAL DATA:  Unwitnessed fall at home EXAM: CT HEAD WITHOUT CONTRAST CT CERVICAL SPINE WITHOUT CONTRAST TECHNIQUE: Multidetector CT imaging of the head and cervical spine was performed following the standard protocol without intravenous contrast. Multiplanar CT image reconstructions of the cervical spine were also generated. COMPARISON:  None. FINDINGS: CT HEAD FINDINGS Brain: No evidence of acute infarction, hemorrhage, hydrocephalus, extra-axial collection or mass lesion/mass effect. Brain atrophy. Vascular: No hyperdense vessel or unexpected calcification. Skull: Normal. Negative for fracture or focal lesion. Sinuses/Orbits: No visible injury CT CERVICAL SPINE FINDINGS Alignment: No traumatic malalignment Skull base and vertebrae: No acute fracture Soft tissues and spinal canal: No prevertebral fluid or swelling. No visible canal hematoma. Disc levels:  Generalized degenerative endplate and facet spurring. Upper chest: No visible injury IMPRESSION: No evidence of acute intracranial or cervical  spine injury. Electronically Signed   By: Monte Fantasia M.D.   On: 02/11/2021 04:33   CT ABDOMEN PELVIS W CONTRAST  Result Date: 02/11/2021 CLINICAL DATA:  Acute abdominal pain, nonlocalized. Fever and diarrhea EXAM: CT ABDOMEN AND PELVIS WITH CONTRAST TECHNIQUE: Multidetector CT imaging of the abdomen and pelvis was performed using the standard protocol following bolus administration of intravenous contrast. CONTRAST:  56m OMNIPAQUE IOHEXOL 350 MG/ML SOLN COMPARISON:  04/11/2020 FINDINGS: Lower chest: No acute finding. Aortic and coronary atherosclerosis. Hepatobiliary: No focal liver abnormality.Full gallbladder but no superimposed inflammatory changes seen in the wall or adjacent fat. Lamellated gallstones. No bile duct dilatation. Pancreas: Unremarkable. Spleen: Unremarkable. Adrenals/Urinary Tract: Negative adrenals. No hydronephrosis or stone. Full bladder. No superimposed acute finding. Stomach/Bowel: Prominent wall thickness of the sigmoid and distal descending colon is likely from collapse. There is however some perirectal mesenteric fat stranding and accentuated rectal mucosal enhancement. No obstruction. Appendectomy. Vascular/Lymphatic: No acute vascular abnormality. Diffuse atheromatous calcification of the aorta and branch vessels. No mass or adenopathy. Reproductive:Hysterectomy Other: No ascites or pneumoperitoneum. Musculoskeletal: No acute abnormalities. Advanced lumbar spine degeneration with high-grade L3-4 and L4-5 stenosis. Remote left pubic body fracture. IMPRESSION: 1. Mild proctitis. 2. Distended urinary bladder. 3. Cholelithiasis. 4. History of fall with no visible injury. Electronically Signed   By: JMonte FantasiaM.D.   On: 02/11/2021 04:38   DG HIP UNILAT W OR W/O PELVIS 2-3 VIEWS LEFT  Result Date: 02/11/2021 CLINICAL DATA:  Recent fall with left hip pain, initial encounter EXAM: DG HIP (WITH OR WITHOUT PELVIS) 2-3V LEFT COMPARISON:  None. FINDINGS: Pelvic ring is intact.  Degenerative changes of the hip joints are noted bilaterally. No acute fracture or dislocation is noted. No soft tissue abnormality is seen. IMPRESSION: Degenerative change without acute abnormality Electronically Signed   By: MInez CatalinaM.D.   On: 02/11/2021 01:41     Assessment & Plan: Pt is a 87y.o. female with a PMHx of CLL, hypothyroidism, proximal atrial fibrillation on anticoagulation, history of vasovagal syncope, hyperlipidemia, osteoporosis, GERD, glaucoma [facility] evaluation of confusion and altered mental status.   1.  Hyponatremia, possible hypovolemic in nature but SIADH also a consideration. 2.  Hypothyroidism. 3.  Altered mental status likely due to hyponatremia.  Plan: Patient presented with altered mental status and a fall.  She was found to have serum sodium of 122 upon admission.  Sodium  has now risen to 126 with just a 0.9 normal saline indicating there may be some element of hypovolemic hyponatremia.  She was not on diuretics at home per our review.  We will proceed with additional work-up including urine osmolality, TSH, cortisol, serum uric acid level, and CT chest as she does have prior history of lung nodules.  Continue IV fluid ration with 0.8 normal saline.  If sodium plateaus we could consider use of tolvaptan but hold off for now.  Further plan as patient progresses.  Thanks for consultation.

## 2021-02-11 NOTE — Evaluation (Signed)
Physical Therapy Evaluation Patient Details Name: Sydney Mcdaniel MRN: [id number] DOB: [birth year] Today's Date: 02/11/2021   History of Present Illness  87 y.o. female with medical history significant for CLL, hypertension, hypothyroidism, paroxysmal A. fib on anticoagulation, history of vasovagal syncope who presents to the ER via EMS for evaluation of weakness, confusion and an unwitnessed fall at home with complaints of pain in the left hip and back.  Clinical Impression  Pt showed great effort with PT exam.  She did need some help to/from supine/sitting but was able to stand and do some in-room ambulation with relative ease, though gait was slow and guarded.  Daughter lives next door and reports she can stay with her as needed, pt has appropriate equipment and home set up.  Will benefit from HHPT but is safe to return home when medically cleared.      Follow Up Recommendations Home health PT;Supervision - Intermittent    Equipment Recommendations  Rolling walker with 5" wheels    Recommendations for Other Services       Precautions / Restrictions Precautions Precautions: Fall Restrictions Weight Bearing Restrictions: No      Mobility  Bed Mobility Overal bed mobility: Needs Assistance Bed Mobility: Supine to Sit;Sit to Supine     Supine to sit: Min assist Sit to supine: Mod assist   General bed mobility comments: light assist to attain upright EOB, more assist needed to get back into bed 2/2 pt to bed height ratio issues    Transfers Overall transfer level: Modified independent Equipment used: Rolling walker (2 wheeled)             General transfer comment: Able to rise from bed and lower commode with UE use but no physical assit  Ambulation/Gait Ambulation/Gait assistance: Min guard Gait Distance (Feet): 45 Feet Assistive device: Rolling walker (2 wheeled)       General Gait Details: Pt able to do some in room ambulaiton, started to have loose stool so  aborted potential trip to hallway and went to the bathroom.  Slow, reliant on walker but w/o LOBs or safety issues  Stairs            Wheelchair Mobility    Modified Rankin (Stroke Patients Only)       Balance Overall balance assessment: Modified Independent                                           Pertinent Vitals/Pain Pain Assessment: No/denies pain    Home Living Family/patient expects to be discharged to:: Private residence Living Arrangements: Alone Available Help at Discharge: Family;Available 24 hours/day   Home Access: Stairs to enter Entrance Stairs-Rails: Right Entrance Stairs-Number of Steps: 2+1   Home Equipment: Walker - 4 wheels;Cane - single point;Grab bars - toilet;Grab bars - tub/shower      Prior Function Level of Independence: Independent with assistive device(s)         Comments: Pt out of home for hair and church weekly, uses walker in the home, [facility] out     Hand Dominance        Extremity/Trunk Assessment   Upper Extremity Assessment Upper Extremity Assessment: Generalized weakness    Lower Extremity Assessment Lower Extremity Assessment: Generalized weakness       Communication   Communication: No difficulties  Cognition Arousal/Alertness: Awake/alert Behavior During Therapy: WFL for tasks assessed/performed Overall Cognitive  Status: Within Functional Limits for tasks assessed                                        General Comments      Exercises     Assessment/Plan    PT Assessment Patient needs continued PT services  PT Problem List Decreased strength;Decreased range of motion;Decreased activity tolerance;Decreased knowledge of use of DME;Decreased safety awareness;Decreased balance;Decreased mobility       PT Treatment Interventions DME instruction;Gait training;Stair training;Functional mobility training;Therapeutic activities;Therapeutic exercise;Balance  training;Patient/family education    PT Goals (Current goals can be found in the Care Plan section)  Acute Rehab PT Goals Patient Stated Goal: go home PT Goal Formulation: With patient Time For Goal Achievement: 02/25/21 Potential to Achieve Goals: Good    Frequency Min 2X/week   Barriers to discharge        Co-evaluation               AM-PAC PT "6 Clicks" Mobility  Outcome Measure Help needed turning from your back to your side while in a flat bed without using bedrails?: A Little Help needed moving from lying on your back to sitting on the side of a flat bed without using bedrails?: A Little Help needed moving to and from a bed to a chair (including a wheelchair)?: A Little Help needed standing up from a chair using your arms (e.g., wheelchair or bedside chair)?: A Little Help needed to walk in hospital room?: A Little Help needed climbing 3-5 steps with a railing? : A Little 6 Click Score: 18    End of Session Equipment Utilized During Treatment: Gait belt Activity Tolerance: Patient tolerated treatment well Patient left: in bed;with call bell/phone within reach;with family/visitor present Nurse Communication: Mobility status PT Visit Diagnosis: Muscle weakness (generalized) (M62.81);Difficulty in walking, not elsewhere classified (R26.2)    Time: [id number] PT Time Calculation (min) (ACUTE ONLY): 28 min   Charges:   PT Evaluation $PT Eval Low Complexity: 1 Low PT Treatments $Gait Training: 8-22 mins        Kreg Shropshire, DPT 02/11/2021, 2:58 PM

## 2021-02-11 NOTE — ED Notes (Signed)
Pt ate approx 2/3 of lunch tray

## 2021-02-11 NOTE — ED Notes (Signed)
Pt. Returned from MRI, requesting bedpan. Pt. Placed on bedpan and cont. Cardiac monitoring initiated. Daughter.

## 2021-02-11 NOTE — ED Notes (Signed)
Pt's linens and purewick changed. Pt and daughter appreciative

## 2021-02-12 DIAGNOSIS — E871 Hypo-osmolality and hyponatremia: Secondary | ICD-10-CM

## 2021-02-12 LAB — BASIC METABOLIC PANEL
Anion gap: 4 — ABNORMAL LOW (ref 5–15)
BUN: 8 mg/dL (ref 8–23)
CO2: 25 mmol/L (ref 22–32)
Calcium: 8.5 mg/dL — ABNORMAL LOW (ref 8.9–10.3)
Chloride: 100 mmol/L (ref 98–111)
Creatinine, Ser: 0.83 mg/dL (ref 0.44–1.00)
GFR, Estimated: >60 mL/min (ref >60)
Glucose, Bld: 132 mg/dL — ABNORMAL HIGH (ref 70–99)
Potassium: 3.9 mmol/L (ref 3.5–5.1)
Sodium: 129 mmol/L — ABNORMAL LOW (ref 135–145)

## 2021-02-12 LAB — CBC
HCT: 30.7 % — ABNORMAL LOW (ref 36.0–46.0)
Hemoglobin: 10.5 g/dL — ABNORMAL LOW (ref 12.0–15.0)
MCH: 30.7 pg (ref 26.0–34.0)
MCHC: 34.2 g/dL (ref 30.0–36.0)
MCV: 89.8 fL (ref 80.0–100.0)
Platelets: 191 10*3/uL (ref 150–400)
RBC: 3.42 MIL/uL — ABNORMAL LOW (ref 3.87–5.11)
RDW: 14.6 % (ref 11.5–15.5)
WBC: 10 10*3/uL (ref 4.0–10.5)
nRBC: 0 % (ref 0.0–0.2)

## 2021-02-12 NOTE — Progress Notes (Signed)
PROGRESS NOTE    Sydney Mcdaniel  [id number] DOB: 05/23/1925 DOA: 02/11/2021 PCP: Kathrine Haddock, NP  Outpatient Specialists: cardiology, heme/onc    Brief Narrative:  From admission hpi: Sydney Mcdaniel is a 87 y.o. female with medical history significant for CLL, hypertension, hypothyroidism, paroxysmal A. fib on anticoagulation, history of vasovagal syncope who presents to the ER via EMS for evaluation of weakness, confusion and an unwitnessed fall at home with complaints of pain in the left hip and back. Patient had 2 teeth extracted 24 hours prior to her presentation and according to the daughter she did not receive any anesthesia for the extraction.  She came home and was in her usual state of health until later in the evening when her life alert went off because she had fallen in her bedroom and was unable to get up.  Has son and daughter went to the house and were able to assist her back to bed.  Patient was extremely weak and unable to ambulate.  Her daughter got concerned when she became confused and was talking about things that were not present.  She called EMS and patient was brought to the ER for further evaluation. Patient does have a history of vasovagal syncope but according to the daughter she is usually diaphoretic prior to those episodes but that did not happen. During my evaluation patient is more awake, alert and oriented to person place and time.  She does not remember the events of the night before and cannot tell me why she fell. She has had a cough and diarrhea but denies having any shortness of breath, no chest pain, no dizziness, no lightheadedness, no urinary frequency, no nocturia, no dysuria, no headache, no leg swelling, no focal deficits or blurred vision. She has not had excessive bleeding from the site of extraction. She is able to move all her extremities.   Assessment & Plan:   Principal Problem:   Hyponatremia Active Problems:   Essential  hypertension   Hypothyroidism   CLL (chronic lymphocytic leukemia) (HCC)   Paroxysmal atrial fibrillation (HCC)   MCI (mild cognitive impairment)   Fall  # Symptomatic chronic hyponatremia Chronic hyponatremia likely siadh, no clear med or other cause, possible reset. Baseline low 130s. Recent dental extraction with resulting decreased po, as well as one episode diarrhea now resolved, thus likely some component of hypovolemia. Urine sodium elevated. Hyponatremia improved with fluid resuscitation, 129 this morning, symptoms resolved. Nephrology following. Cortisone and tsh wnl - will plan for another day of iv fluids, assuming continued improvement d/c in am, can consider increase in dietary sodium  # Paroxysmal a fib - cont xarelto  # HTN Here bp mild elevation - cont home meds  # Hypothyroid Tsh wnl - cont home synthroid  # CLL CBC stable - followed by heme/onc as outpt  # Recent dental surgery - cont amoxicillin  DVT prophylaxis: xarelto Code Status: dnr Family Communication: daughter updated @ bedside 8/20  Level of care: Progressive Cardiac Status is: Inpatient  Remains inpatient appropriate because:Inpatient level of care appropriate due to severity of illness  Dispo: The patient is from: Home              Anticipated d/c is to: Home              Patient currently is not medically stable to d/c.   Difficult to place patient No        Consultants:  nephrology  Procedures: none  Antimicrobials:  amoxicillin    Subjective: This morning feels at baseline, no pain, has appetite, no diarrhea  Objective: Vitals:   02/11/21 1559 02/11/21 1953 02/12/21 0332 02/12/21 0728  BP: (!) 145/82 (!) 142/49 (!) 157/62 (!) 158/63  Pulse: 72 72 69 82  Resp: 16   16  Temp: 98.5 F (36.9 C) 98.2 F (36.8 C) 97.9 F (36.6 C) 98.2 F (36.8 C)  TempSrc:      SpO2: 100% 99% 100% 100%  Weight:      Height:        Intake/Output Summary (Last 24 hours) at  02/12/2021 0934 Last data filed at 02/12/2021 0537 Gross per 24 hour  Intake 1274.3 ml  Output 3025 ml  Net -1750.7 ml   Filed Weights   02/11/21 1039  Weight: 68 kg    Examination:  General exam: Appears calm and comfortable  Respiratory system: Clear to auscultation. Respiratory effort normal. Cardiovascular system: S1 & S2 heard, RRR. No JVD, murmurs, rubs, gallops or clicks. No pedal edema. Gastrointestinal system: Abdomen is nondistended, soft and nontender. No organomegaly or masses felt. Normal bowel sounds heard. Central nervous system: Alert and oriented. No focal neurological deficits. Extremities: Symmetric 5 x 5 power. Skin: No rashes, lesions or ulcers Psychiatry: Judgement and insight appear normal. Mood & affect appropriate.     Data Reviewed: I have personally reviewed following labs and imaging studies  CBC: Recent Labs  Lab 02/11/21 0220 02/12/21 0438  WBC 14.5* 10.0  NEUTROABS 9.9*  --   HGB 10.9* 10.5*  HCT 31.7* 30.7*  MCV 89.0 89.8  PLT 196 [id number]   Basic Metabolic Panel: Recent Labs  Lab 02/11/21 0220 02/11/21 1113 02/12/21 0438  NA 122* 126* 129*  K 3.6  --  3.9  CL 91*  --  100  CO2 22  --  25  GLUCOSE 142*  --  132*  BUN 12  --  8  CREATININE 0.71  --  0.83  CALCIUM 8.8*  --  8.5*   GFR: Estimated Creatinine Clearance: 38.3 mL/min (by C-G formula based on SCr of 0.83 mg/dL). Liver Function Tests: Recent Labs  Lab 02/11/21 0220  AST 25  ALT 19  ALKPHOS 33*  BILITOT 1.4*  PROT 7.1  ALBUMIN 3.9   No results for input(s): LIPASE, AMYLASE in the last 168 hours. No results for input(s): AMMONIA in the last 168 hours. Coagulation Profile: No results for input(s): INR, PROTIME in the last 168 hours. Cardiac Enzymes: No results for input(s): CKTOTAL, CKMB, CKMBINDEX, TROPONINI in the last 168 hours. BNP (last 3 results) No results for input(s): PROBNP in the last 8760 hours. HbA1C: No results for input(s): HGBA1C in the last 72  hours. CBG: No results for input(s): GLUCAP in the last 168 hours. Lipid Profile: No results for input(s): CHOL, HDL, LDLCALC, TRIG, CHOLHDL, LDLDIRECT in the last 72 hours. Thyroid Function Tests: Recent Labs    02/11/21 0220  TSH 4.253   Anemia Panel: No results for input(s): VITAMINB12, FOLATE, FERRITIN, TIBC, IRON, RETICCTPCT in the last 72 hours. Urine analysis:    Component Value Date/Time   COLORURINE YELLOW (A) 02/11/2021 0332   APPEARANCEUR CLEAR (A) 02/11/2021 0332   LABSPEC 1.008 02/11/2021 0332   PHURINE 7.0 02/11/2021 0332   GLUCOSEU NEGATIVE 02/11/2021 0332   HGBUR NEGATIVE 02/11/2021 0332   BILIRUBINUR NEGATIVE 02/11/2021 0332   BILIRUBINUR neg 02/25/2016 1013   KETONESUR 20 (A) 02/11/2021 0332   PROTEINUR NEGATIVE 02/11/2021 0332   UROBILINOGEN  0.2 02/25/2016 1013   NITRITE NEGATIVE 02/11/2021 0332   LEUKOCYTESUR NEGATIVE 02/11/2021 0332   Sepsis Labs: '@LABRCNTIP'$ (procalcitonin:4,lacticidven:4)  ) Recent Results (from the past 240 hour(s))  Resp Panel by RT-PCR (Flu A&B, Covid) Nasopharyngeal Swab     Status: None   Collection Time: 02/11/21  2:22 AM   Specimen: Nasopharyngeal Swab; Nasopharyngeal(NP) swabs in vial transport medium  Result Value Ref Range Status   SARS Coronavirus 2 by RT PCR NEGATIVE NEGATIVE Final    Comment: (NOTE) SARS-CoV-2 target nucleic acids are NOT DETECTED.  The SARS-CoV-2 RNA is generally detectable in upper respiratory specimens during the acute phase of infection. The lowest concentration of SARS-CoV-2 viral copies this assay can detect is 138 copies/mL. A negative result does not preclude SARS-Cov-2 infection and should not be used as the sole basis for treatment or other patient management decisions. A negative result may occur with  improper specimen collection/handling, submission of specimen other than nasopharyngeal swab, presence of viral mutation(s) within the areas targeted by this assay, and inadequate number of  viral copies(<138 copies/mL). A negative result must be combined with clinical observations, patient history, and epidemiological information. The expected result is Negative.  Fact Sheet for Patients:  EntrepreneurPulse.com.au  Fact Sheet for Healthcare Providers:  IncredibleEmployment.be  This test is no t yet approved or cleared by the [place] FDA and  has been authorized for detection and/or diagnosis of SARS-CoV-2 by FDA under an Emergency Use Authorization (EUA). This EUA will remain  in effect (meaning this test can be used) for the duration of the COVID-19 declaration under Section 564(b)(1) of the Act, 21 U.S.C.section 360bbb-3(b)(1), unless the authorization is terminated  or revoked sooner.       Influenza A by PCR NEGATIVE NEGATIVE Final   Influenza B by PCR NEGATIVE NEGATIVE Final    Comment: (NOTE) The Xpert Xpress SARS-CoV-2/FLU/RSV plus assay is intended as an aid in the diagnosis of influenza from Nasopharyngeal swab specimens and should not be used as a sole basis for treatment. Nasal washings and aspirates are unacceptable for Xpert Xpress SARS-CoV-2/FLU/RSV testing.  Fact Sheet for Patients: EntrepreneurPulse.com.au  Fact Sheet for Healthcare Providers: IncredibleEmployment.be  This test is not yet approved or cleared by the [place] FDA and has been authorized for detection and/or diagnosis of SARS-CoV-2 by FDA under an Emergency Use Authorization (EUA). This EUA will remain in effect (meaning this test can be used) for the duration of the COVID-19 declaration under Section 564(b)(1) of the Act, 21 U.S.C. section 360bbb-3(b)(1), unless the authorization is terminated or revoked.  Performed at [facility], [address], [place], [place] [address]          Radiology Studies: DG Chest 2 View  Result Date: 02/11/2021 CLINICAL DATA:  Un witnessed fall  in home with chest pain, initial encounter EXAM: CHEST - 2 VIEW COMPARISON:  02/25/2019 FINDINGS: Cardiac shadow is within normal limits. Aortic calcifications are seen. The lungs are well aerated bilaterally. No focal infiltrate or sizable effusion is seen. No bony abnormality is noted. IMPRESSION: No acute abnormality noted. Electronically Signed   By: Inez Catalina M.D.   On: 02/11/2021 01:38   DG Lumbar Spine 2-3 Views  Result Date: 02/11/2021 CLINICAL DATA:  Recent fall with low back pain, initial encounter EXAM: LUMBAR SPINE - 3 VIEW COMPARISON:  06/26/2011 FINDINGS: Five lumbar type vertebral bodies are well visualized. Vertebral body height is well maintained. Multilevel disc space narrowing is noted. Mild retrolisthesis of L2 on  L3 is noted. Aortic calcifications are noted. Multilevel osteophytic changes are seen. Calcification is noted to the left of the midline which may represent renal calculus. This measures approximately 8 mm. Triangular calcifications are noted in the right upper quadrant consistent with cholelithiasis. IMPRESSION: Degenerative change of the lumbar spine as described. Calcification to the left of the midline which may represent a renal calculus. Cholelithiasis is noted in the right upper quadrant. Electronically Signed   By: Inez Catalina M.D.   On: 02/11/2021 01:40   CT HEAD WO CONTRAST (5MM)  Result Date: 02/11/2021 CLINICAL DATA:  Unwitnessed fall at home EXAM: CT HEAD WITHOUT CONTRAST CT CERVICAL SPINE WITHOUT CONTRAST TECHNIQUE: Multidetector CT imaging of the head and cervical spine was performed following the standard protocol without intravenous contrast. Multiplanar CT image reconstructions of the cervical spine were also generated. COMPARISON:  None. FINDINGS: CT HEAD FINDINGS Brain: No evidence of acute infarction, hemorrhage, hydrocephalus, extra-axial collection or mass lesion/mass effect. Brain atrophy. Vascular: No hyperdense vessel or unexpected calcification.  Skull: Normal. Negative for fracture or focal lesion. Sinuses/Orbits: No visible injury CT CERVICAL SPINE FINDINGS Alignment: No traumatic malalignment Skull base and vertebrae: No acute fracture Soft tissues and spinal canal: No prevertebral fluid or swelling. No visible canal hematoma. Disc levels:  Generalized degenerative endplate and facet spurring. Upper chest: No visible injury IMPRESSION: No evidence of acute intracranial or cervical spine injury. Electronically Signed   By: Monte Fantasia M.D.   On: 02/11/2021 04:33   CT CHEST W CONTRAST  Result Date: 02/11/2021 CLINICAL DATA:  Hyponatremia, history of pulmonary nodules EXAM: CT CHEST WITH CONTRAST TECHNIQUE: Multidetector CT imaging of the chest was performed during intravenous contrast administration. CONTRAST:  79m OMNIPAQUE IOHEXOL 350 MG/ML SOLN COMPARISON:  02/11/2021, 11/07/2019 FINDINGS: Cardiovascular: The heart is unremarkable without pericardial effusion. No evidence of thoracic aortic aneurysm or dissection. Moderate atherosclerosis throughout the aorta, great vessels, and coronary vasculature. Mediastinum/Nodes: No enlarged mediastinal, hilar, or axillary lymph nodes. Thyroid gland, trachea, and esophagus demonstrate no significant findings. Lungs/Pleura: No acute airspace disease, effusion, or pneumothorax. Central airways are patent. There is a persistent 4 mm ground-glass nodule within the right lower lobe image 98/3, stable. A 3 mm ground-glass nodule within the right lower lobe image 101/3, stable. The remaining subcentimeter pulmonary nodule seen previously have either resolved or decreased in size since prior study. Based on long-term stability, no additional follow-up is required. Upper Abdomen: Multiple calcified gallstones are again identified. No evidence of acute cholecystitis. No other acute upper abdominal findings. Musculoskeletal: There are no acute or destructive bony lesions. Reconstructed images demonstrate no  additional findings. IMPRESSION: 1. Subcentimeter pulmonary nodules as above, either stable, decreased, or resolved since prior studies. Given long-term stability, no specific follow-up is required at this time. This recommendation follows the consensus statement: Guidelines for Management of Incidental Pulmonary Nodules Detected on CT Images: From the Fleischner Society 2017; Radiology 2017; 284:228-243. 2. No acute intrathoracic process. 3. Aortic Atherosclerosis (ICD10-I70.0). Coronary artery atherosclerosis. 4. Cholelithiasis without cholecystitis. Electronically Signed   By: MRanda NgoM.D.   On: 02/11/2021 20:29   CT Cervical Spine Wo Contrast  Result Date: 02/11/2021 CLINICAL DATA:  Unwitnessed fall at home EXAM: CT HEAD WITHOUT CONTRAST CT CERVICAL SPINE WITHOUT CONTRAST TECHNIQUE: Multidetector CT imaging of the head and cervical spine was performed following the standard protocol without intravenous contrast. Multiplanar CT image reconstructions of the cervical spine were also generated. COMPARISON:  None. FINDINGS: CT HEAD FINDINGS Brain: No evidence of  acute infarction, hemorrhage, hydrocephalus, extra-axial collection or mass lesion/mass effect. Brain atrophy. Vascular: No hyperdense vessel or unexpected calcification. Skull: Normal. Negative for fracture or focal lesion. Sinuses/Orbits: No visible injury CT CERVICAL SPINE FINDINGS Alignment: No traumatic malalignment Skull base and vertebrae: No acute fracture Soft tissues and spinal canal: No prevertebral fluid or swelling. No visible canal hematoma. Disc levels:  Generalized degenerative endplate and facet spurring. Upper chest: No visible injury IMPRESSION: No evidence of acute intracranial or cervical spine injury. Electronically Signed   By: Monte Fantasia M.D.   On: 02/11/2021 04:33   CT ABDOMEN PELVIS W CONTRAST  Result Date: 02/11/2021 CLINICAL DATA:  Acute abdominal pain, nonlocalized. Fever and diarrhea EXAM: CT ABDOMEN AND  PELVIS WITH CONTRAST TECHNIQUE: Multidetector CT imaging of the abdomen and pelvis was performed using the standard protocol following bolus administration of intravenous contrast. CONTRAST:  52m OMNIPAQUE IOHEXOL 350 MG/ML SOLN COMPARISON:  04/11/2020 FINDINGS: Lower chest: No acute finding. Aortic and coronary atherosclerosis. Hepatobiliary: No focal liver abnormality.Full gallbladder but no superimposed inflammatory changes seen in the wall or adjacent fat. Lamellated gallstones. No bile duct dilatation. Pancreas: Unremarkable. Spleen: Unremarkable. Adrenals/Urinary Tract: Negative adrenals. No hydronephrosis or stone. Full bladder. No superimposed acute finding. Stomach/Bowel: Prominent wall thickness of the sigmoid and distal descending colon is likely from collapse. There is however some perirectal mesenteric fat stranding and accentuated rectal mucosal enhancement. No obstruction. Appendectomy. Vascular/Lymphatic: No acute vascular abnormality. Diffuse atheromatous calcification of the aorta and branch vessels. No mass or adenopathy. Reproductive:Hysterectomy Other: No ascites or pneumoperitoneum. Musculoskeletal: No acute abnormalities. Advanced lumbar spine degeneration with high-grade L3-4 and L4-5 stenosis. Remote left pubic body fracture. IMPRESSION: 1. Mild proctitis. 2. Distended urinary bladder. 3. Cholelithiasis. 4. History of fall with no visible injury. Electronically Signed   By: JMonte FantasiaM.D.   On: 02/11/2021 04:38   DG HIP UNILAT W OR W/O PELVIS 2-3 VIEWS LEFT  Result Date: 02/11/2021 CLINICAL DATA:  Recent fall with left hip pain, initial encounter EXAM: DG HIP (WITH OR WITHOUT PELVIS) 2-3V LEFT COMPARISON:  None. FINDINGS: Pelvic ring is intact. Degenerative changes of the hip joints are noted bilaterally. No acute fracture or dislocation is noted. No soft tissue abnormality is seen. IMPRESSION: Degenerative change without acute abnormality Electronically Signed   By: MInez Catalina M.D.   On: 02/11/2021 01:41        Scheduled Meds:  amoxicillin  500 mg Oral TID   atorvastatin  40 mg Oral QPM   calcium-vitamin D   Oral Daily   cholecalciferol  1,000 Units Oral Daily   famotidine  20 mg Oral Daily   ferrous sulfate  325 mg Oral BID WC   latanoprost  1 drop Right Eye QHS   levothyroxine  88 mcg Oral Q0600   lisinopril  20 mg Oral Daily   multivitamin with minerals   Oral Daily   omega-3 acid ethyl esters  1 g Oral Daily   polyethylene glycol  17 g Oral Daily   Rivaroxaban  15 mg Oral Q supper   sodium chloride flush  3 mL Intravenous Q12H   timolol  1 drop Right Eye BID   Continuous Infusions:  sodium chloride     sodium chloride 50 mL/hr at 02/11/21 1631     LOS: 1 day    Time spent: 437min    NDesma Maxim MD [facility]   If 7PM-7AM, please contact night-coverage [email or web address] Password [email or web address]8/20/2022, 9:34 AM

## 2021-02-12 NOTE — Progress Notes (Signed)
[facility]  ROUNDING NOTE   Subjective:   Daughter at bedside.  Patient more alert and talkative this morning.   Na 129   Objective:  Vital signs in last 24 hours:  Temp:  [97.9 F (36.6 C)-98.5 F (36.9 C)] 98.3 F (36.8 C) (08/20 1143) Pulse Rate:  [66-82] 80 (08/20 1143) Resp:  [14-22] 22 (08/20 1143) BP: (123-171)/(44-119) 171/54 (08/20 1143) SpO2:  [97 %-100 %] 98 % (08/20 1143)  Weight change:  Filed Weights   02/11/21 1039  Weight: 68 kg    Intake/Output: I/O last 3 completed shifts: In: 1274.3 [P.O.:290; I.V.:984.3] Out: 3025 [Urine:3025]   Intake/Output this shift:  Total I/O In: 240 [P.O.:240] Out: -   Physical Exam: General: NAD,   Head: Normocephalic, atraumatic. Dry oral mucosal membranes  Eyes: Anicteric, PERRL  Neck: Supple, trachea midline  Lungs:  Clear to auscultation  Heart: Regular rate and rhythm  Abdomen:  Soft, nontender  Extremities:  no peripheral edema.  Neurologic: Nonfocal, moving all four extremities  Skin: No lesions        Basic Metabolic Panel: Recent Labs  Lab 02/11/21 0220 02/11/21 1113 02/12/21 0438  NA 122* 126* 129*  K 3.6  --  3.9  CL 91*  --  100  CO2 22  --  25  GLUCOSE 142*  --  132*  BUN 12  --  8  CREATININE 0.71  --  0.83  CALCIUM 8.8*  --  8.5*    Liver Function Tests: Recent Labs  Lab 02/11/21 0220  AST 25  ALT 19  ALKPHOS 33*  BILITOT 1.4*  PROT 7.1  ALBUMIN 3.9   No results for input(s): LIPASE, AMYLASE in the last 168 hours. No results for input(s): AMMONIA in the last 168 hours.  CBC: Recent Labs  Lab 02/11/21 0220 02/12/21 0438  WBC 14.5* 10.0  NEUTROABS 9.9*  --   HGB 10.9* 10.5*  HCT 31.7* 30.7*  MCV 89.0 89.8  PLT 196 191    Cardiac Enzymes: No results for input(s): CKTOTAL, CKMB, CKMBINDEX, TROPONINI in the last 168 hours.  BNP: Invalid input(s): POCBNP  CBG: No results for input(s): GLUCAP in the last 168 hours.  Microbiology: Results for  orders placed or performed during the hospital encounter of 02/11/21  Resp Panel by RT-PCR (Flu A&B, Covid) Nasopharyngeal Swab     Status: None   Collection Time: 02/11/21  2:22 AM   Specimen: Nasopharyngeal Swab; Nasopharyngeal(NP) swabs in vial transport medium  Result Value Ref Range Status   SARS Coronavirus 2 by RT PCR NEGATIVE NEGATIVE Final    Comment: (NOTE) SARS-CoV-2 target nucleic acids are NOT DETECTED.  The SARS-CoV-2 RNA is generally detectable in upper respiratory specimens during the acute phase of infection. The lowest concentration of SARS-CoV-2 viral copies this assay can detect is 138 copies/mL. A negative result does not preclude SARS-Cov-2 infection and should not be used as the sole basis for treatment or other patient management decisions. A negative result may occur with  improper specimen collection/handling, submission of specimen other than nasopharyngeal swab, presence of viral mutation(s) within the areas targeted by this assay, and inadequate number of viral copies(<138 copies/mL). A negative result must be combined with clinical observations, patient history, and epidemiological information. The expected result is Negative.  Fact Sheet for Patients:  EntrepreneurPulse.com.au  Fact Sheet for Healthcare Providers:  IncredibleEmployment.be  This test is no t yet approved or cleared by the [place] FDA and  has been  authorized for detection and/or diagnosis of SARS-CoV-2 by FDA under an Emergency Use Authorization (EUA). This EUA will remain  in effect (meaning this test can be used) for the duration of the COVID-19 declaration under Section 564(b)(1) of the Act, 21 U.S.C.section 360bbb-3(b)(1), unless the authorization is terminated  or revoked sooner.       Influenza A by PCR NEGATIVE NEGATIVE Final   Influenza B by PCR NEGATIVE NEGATIVE Final    Comment: (NOTE) The Xpert Xpress SARS-CoV-2/FLU/RSV plus  assay is intended as an aid in the diagnosis of influenza from Nasopharyngeal swab specimens and should not be used as a sole basis for treatment. Nasal washings and aspirates are unacceptable for Xpert Xpress SARS-CoV-2/FLU/RSV testing.  Fact Sheet for Patients: EntrepreneurPulse.com.au  Fact Sheet for Healthcare Providers: IncredibleEmployment.be  This test is not yet approved or cleared by the [place] FDA and has been authorized for detection and/or diagnosis of SARS-CoV-2 by FDA under an Emergency Use Authorization (EUA). This EUA will remain in effect (meaning this test can be used) for the duration of the COVID-19 declaration under Section 564(b)(1) of the Act, 21 U.S.C. section 360bbb-3(b)(1), unless the authorization is terminated or revoked.  Performed at [facility], [address] [place], [place] [address]     Coagulation Studies: No results for input(s): LABPROT, INR in the last 72 hours.  Urinalysis: Recent Labs    02/11/21 0332  COLORURINE YELLOW*  LABSPEC 1.008  PHURINE 7.0  GLUCOSEU NEGATIVE  HGBUR NEGATIVE  BILIRUBINUR NEGATIVE  KETONESUR 20*  PROTEINUR NEGATIVE  NITRITE NEGATIVE  LEUKOCYTESUR NEGATIVE      Imaging: DG Chest 2 View  Result Date: 02/11/2021 CLINICAL DATA:  Un witnessed fall in home with chest pain, initial encounter EXAM: CHEST - 2 VIEW COMPARISON:  02/25/2019 FINDINGS: Cardiac shadow is within normal limits. Aortic calcifications are seen. The lungs are well aerated bilaterally. No focal infiltrate or sizable effusion is seen. No bony abnormality is noted. IMPRESSION: No acute abnormality noted. Electronically Signed   By: Inez Catalina M.D.   On: 02/11/2021 01:38   DG Lumbar Spine 2-3 Views  Result Date: 02/11/2021 CLINICAL DATA:  Recent fall with low back pain, initial encounter EXAM: LUMBAR SPINE - 3 VIEW COMPARISON:  06/26/2011 FINDINGS: Five lumbar type vertebral bodies are  well visualized. Vertebral body height is well maintained. Multilevel disc space narrowing is noted. Mild retrolisthesis of L2 on L3 is noted. Aortic calcifications are noted. Multilevel osteophytic changes are seen. Calcification is noted to the left of the midline which may represent renal calculus. This measures approximately 8 mm. Triangular calcifications are noted in the right upper quadrant consistent with cholelithiasis. IMPRESSION: Degenerative change of the lumbar spine as described. Calcification to the left of the midline which may represent a renal calculus. Cholelithiasis is noted in the right upper quadrant. Electronically Signed   By: Inez Catalina M.D.   On: 02/11/2021 01:40   CT HEAD WO CONTRAST (5MM)  Result Date: 02/11/2021 CLINICAL DATA:  Unwitnessed fall at home EXAM: CT HEAD WITHOUT CONTRAST CT CERVICAL SPINE WITHOUT CONTRAST TECHNIQUE: Multidetector CT imaging of the head and cervical spine was performed following the standard protocol without intravenous contrast. Multiplanar CT image reconstructions of the cervical spine were also generated. COMPARISON:  None. FINDINGS: CT HEAD FINDINGS Brain: No evidence of acute infarction, hemorrhage, hydrocephalus, extra-axial collection or mass lesion/mass effect. Brain atrophy. Vascular: No hyperdense vessel or unexpected calcification. Skull: Normal. Negative for fracture or focal lesion. Sinuses/Orbits: No  visible injury CT CERVICAL SPINE FINDINGS Alignment: No traumatic malalignment Skull base and vertebrae: No acute fracture Soft tissues and spinal canal: No prevertebral fluid or swelling. No visible canal hematoma. Disc levels:  Generalized degenerative endplate and facet spurring. Upper chest: No visible injury IMPRESSION: No evidence of acute intracranial or cervical spine injury. Electronically Signed   By: Monte Fantasia M.D.   On: 02/11/2021 04:33   CT CHEST W CONTRAST  Result Date: 02/11/2021 CLINICAL DATA:  Hyponatremia, history of  pulmonary nodules EXAM: CT CHEST WITH CONTRAST TECHNIQUE: Multidetector CT imaging of the chest was performed during intravenous contrast administration. CONTRAST:  47m OMNIPAQUE IOHEXOL 350 MG/ML SOLN COMPARISON:  02/11/2021, 11/07/2019 FINDINGS: Cardiovascular: The heart is unremarkable without pericardial effusion. No evidence of thoracic aortic aneurysm or dissection. Moderate atherosclerosis throughout the aorta, great vessels, and coronary vasculature. Mediastinum/Nodes: No enlarged mediastinal, hilar, or axillary lymph nodes. Thyroid gland, trachea, and esophagus demonstrate no significant findings. Lungs/Pleura: No acute airspace disease, effusion, or pneumothorax. Central airways are patent. There is a persistent 4 mm ground-glass nodule within the right lower lobe image 98/3, stable. A 3 mm ground-glass nodule within the right lower lobe image 101/3, stable. The remaining subcentimeter pulmonary nodule seen previously have either resolved or decreased in size since prior study. Based on long-term stability, no additional follow-up is required. Upper Abdomen: Multiple calcified gallstones are again identified. No evidence of acute cholecystitis. No other acute upper abdominal findings. Musculoskeletal: There are no acute or destructive bony lesions. Reconstructed images demonstrate no additional findings. IMPRESSION: 1. Subcentimeter pulmonary nodules as above, either stable, decreased, or resolved since prior studies. Given long-term stability, no specific follow-up is required at this time. This recommendation follows the consensus statement: Guidelines for Management of Incidental Pulmonary Nodules Detected on CT Images: From the Fleischner Society 2017; Radiology 2017; 284:228-243. 2. No acute intrathoracic process. 3. Aortic Atherosclerosis (ICD10-I70.0). Coronary artery atherosclerosis. 4. Cholelithiasis without cholecystitis. Electronically Signed   By: MRanda NgoM.D.   On: 02/11/2021 20:29    CT Cervical Spine Wo Contrast  Result Date: 02/11/2021 CLINICAL DATA:  Unwitnessed fall at home EXAM: CT HEAD WITHOUT CONTRAST CT CERVICAL SPINE WITHOUT CONTRAST TECHNIQUE: Multidetector CT imaging of the head and cervical spine was performed following the standard protocol without intravenous contrast. Multiplanar CT image reconstructions of the cervical spine were also generated. COMPARISON:  None. FINDINGS: CT HEAD FINDINGS Brain: No evidence of acute infarction, hemorrhage, hydrocephalus, extra-axial collection or mass lesion/mass effect. Brain atrophy. Vascular: No hyperdense vessel or unexpected calcification. Skull: Normal. Negative for fracture or focal lesion. Sinuses/Orbits: No visible injury CT CERVICAL SPINE FINDINGS Alignment: No traumatic malalignment Skull base and vertebrae: No acute fracture Soft tissues and spinal canal: No prevertebral fluid or swelling. No visible canal hematoma. Disc levels:  Generalized degenerative endplate and facet spurring. Upper chest: No visible injury IMPRESSION: No evidence of acute intracranial or cervical spine injury. Electronically Signed   By: JMonte FantasiaM.D.   On: 02/11/2021 04:33   CT ABDOMEN PELVIS W CONTRAST  Result Date: 02/11/2021 CLINICAL DATA:  Acute abdominal pain, nonlocalized. Fever and diarrhea EXAM: CT ABDOMEN AND PELVIS WITH CONTRAST TECHNIQUE: Multidetector CT imaging of the abdomen and pelvis was performed using the standard protocol following bolus administration of intravenous contrast. CONTRAST:  759mOMNIPAQUE IOHEXOL 350 MG/ML SOLN COMPARISON:  04/11/2020 FINDINGS: Lower chest: No acute finding. Aortic and coronary atherosclerosis. Hepatobiliary: No focal liver abnormality.Full gallbladder but no superimposed inflammatory changes seen in the wall or adjacent  fat. Lamellated gallstones. No bile duct dilatation. Pancreas: Unremarkable. Spleen: Unremarkable. Adrenals/Urinary Tract: Negative adrenals. No hydronephrosis or stone. Full  bladder. No superimposed acute finding. Stomach/Bowel: Prominent wall thickness of the sigmoid and distal descending colon is likely from collapse. There is however some perirectal mesenteric fat stranding and accentuated rectal mucosal enhancement. No obstruction. Appendectomy. Vascular/Lymphatic: No acute vascular abnormality. Diffuse atheromatous calcification of the aorta and branch vessels. No mass or adenopathy. Reproductive:Hysterectomy Other: No ascites or pneumoperitoneum. Musculoskeletal: No acute abnormalities. Advanced lumbar spine degeneration with high-grade L3-4 and L4-5 stenosis. Remote left pubic body fracture. IMPRESSION: 1. Mild proctitis. 2. Distended urinary bladder. 3. Cholelithiasis. 4. History of fall with no visible injury. Electronically Signed   By: Monte Fantasia M.D.   On: 02/11/2021 04:38   DG HIP UNILAT W OR W/O PELVIS 2-3 VIEWS LEFT  Result Date: 02/11/2021 CLINICAL DATA:  Recent fall with left hip pain, initial encounter EXAM: DG HIP (WITH OR WITHOUT PELVIS) 2-3V LEFT COMPARISON:  None. FINDINGS: Pelvic ring is intact. Degenerative changes of the hip joints are noted bilaterally. No acute fracture or dislocation is noted. No soft tissue abnormality is seen. IMPRESSION: Degenerative change without acute abnormality Electronically Signed   By: Inez Catalina M.D.   On: 02/11/2021 01:41     Medications:    sodium chloride     sodium chloride 75 mL/hr at 02/12/21 [id number]    amoxicillin  500 mg Oral TID   atorvastatin  40 mg Oral QPM   calcium-vitamin D   Oral Daily   cholecalciferol  1,000 Units Oral Daily   famotidine  20 mg Oral Daily   ferrous sulfate  325 mg Oral BID WC   latanoprost  1 drop Right Eye QHS   levothyroxine  88 mcg Oral Q0600   lisinopril  20 mg Oral Daily   multivitamin with minerals   Oral Daily   omega-3 acid ethyl esters  1 g Oral Daily   polyethylene glycol  17 g Oral Daily   Rivaroxaban  15 mg Oral Q supper   sodium chloride flush  3 mL  Intravenous Q12H   timolol  1 drop Right Eye BID   sodium chloride, acetaminophen **OR** acetaminophen, ondansetron **OR** ondansetron (ZOFRAN) IV, sodium chloride flush  Assessment/ Plan:  [name] is a 87 y.o. white female CLL, hypothyroidism, proximal atrial fibrillation on anticoagulation, history of vasovagal syncope, hyperlipidemia, osteoporosis, GERD, glaucoma who was admitted to [facility] on 02/11/2021 for Hyponatremia [E87.1] Fall [W19.XXXA] Fall, initial encounter [id number]   1.  Hyponatremia Improved with normal saline infusion. 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  If no improvement, consider tolvaptan.   2.  Hypothyroidism. TSH at goal. Continue levothyroxine with current dosage.   3.  Hypertension: elevated. Continue lisinopril. Also on amlodipine at home.      LOS: 1 Sydney Mcdaniel 8/20/202212:54 PM

## 2021-02-12 NOTE — TOC Progression Note (Addendum)
Transition of Care [facility]) - Progression Note    Patient Details  Name: Sydney Mcdaniel MRN: [id number] Date of Birth: 09/11/1924  Transition of Care [facility]) CM/SW [place], RN Phone Number: 02/12/2021, 3:25 PM  Clinical Narrative:   Daughter at bedside, states patient lives alone but daughter is next door and can assist as much as needed. Daughter transports patient to appointments, and obtains medications.  Patient is able to take medications as directed without concerns.  Patient walks with a cane at home.  Upon PT eval yesterday, patient was 1 assist.  Today patient states her weakness is increasing and she was a 2 person assist getting out of bed.  She has no complaints of pain, just weakness. Care RN aware and PT made aware.  PT will attempt to see patient tomorrow.  PT recommendation yesterday was for home health PT.  Patient does not currently have home health, and is amenable to plan.  Patient was very tired and asked to rest.  TOC will return to discuss further.  TOC contact information given, will follow to discharge.  Corene Cornea from [facility] notified of [place] needs.      Expected Discharge Plan and Services                                                 Social Determinants of Health (SDOH) Interventions    Readmission Risk Interventions No flowsheet data found.

## 2021-02-13 DIAGNOSIS — E871 Hypo-osmolality and hyponatremia: Secondary | ICD-10-CM | POA: Diagnosis not present

## 2021-02-13 LAB — BASIC METABOLIC PANEL
Anion gap: 5 (ref 5–15)
BUN: 10 mg/dL (ref 8–23)
CO2: 22 mmol/L (ref 22–32)
Calcium: 8.3 mg/dL — ABNORMAL LOW (ref 8.9–10.3)
Chloride: 101 mmol/L (ref 98–111)
Creatinine, Ser: 0.61 mg/dL (ref 0.44–1.00)
GFR, Estimated: >60 mL/min (ref >60)
Glucose, Bld: 113 mg/dL — ABNORMAL HIGH (ref 70–99)
Potassium: 3.6 mmol/L (ref 3.5–5.1)
Sodium: 128 mmol/L — ABNORMAL LOW (ref 135–145)

## 2021-02-13 MED ORDER — SODIUM CHLORIDE 1 G PO TABS
4.0000 g | ORAL_TABLET | Freq: Once | ORAL | Status: AC
  Administered 2021-02-13: 15:00:00 4 g via ORAL
  Filled 2021-02-13: qty 4

## 2021-02-13 MED ORDER — ORAL CARE MOUTH RINSE
15.0000 mL | Freq: Two times a day (BID) | OROMUCOSAL | Status: DC
  Administered 2021-02-13 – 2021-02-14 (×3): 15 mL via OROMUCOSAL

## 2021-02-13 MED ORDER — SODIUM CHLORIDE 1 G PO TABS
2.0000 g | ORAL_TABLET | Freq: Two times a day (BID) | ORAL | Status: DC
  Administered 2021-02-14: 12:00:00 2 g via ORAL
  Filled 2021-02-13: qty 2

## 2021-02-13 MED ORDER — SODIUM CHLORIDE 1 G PO TABS: 2.0000 g | ORAL_TABLET | Freq: Two times a day (BID) | ORAL | Status: DC

## 2021-02-13 NOTE — Progress Notes (Signed)
PROGRESS NOTE    Sydney Mcdaniel  [id number] DOB: 12-Jan-1925 DOA: 02/11/2021 PCP: Kathrine Haddock, NP  Outpatient Specialists: cardiology, heme/onc    Brief Narrative:  From admission hpi: Sydney Mcdaniel is a 87 y.o. female with medical history significant for CLL, hypertension, hypothyroidism, paroxysmal A. fib on anticoagulation, history of vasovagal syncope who presents to the ER via EMS for evaluation of weakness, confusion and an unwitnessed fall at home with complaints of pain in the left hip and back. Patient had 2 teeth extracted 24 hours prior to her presentation and according to the daughter she did not receive any anesthesia for the extraction.  She came home and was in her usual state of health until later in the evening when her life alert went off because she had fallen in her bedroom and was unable to get up.  Has son and daughter went to the house and were able to assist her back to bed.  Patient was extremely weak and unable to ambulate.  Her daughter got concerned when she became confused and was talking about things that were not present.  She called EMS and patient was brought to the ER for further evaluation. Patient does have a history of vasovagal syncope but according to the daughter she is usually diaphoretic prior to those episodes but that did not happen. During my evaluation patient is more awake, alert and oriented to person place and time.  She does not remember the events of the night before and cannot tell me why she fell. She has had a cough and diarrhea but denies having any shortness of breath, no chest pain, no dizziness, no lightheadedness, no urinary frequency, no nocturia, no dysuria, no headache, no leg swelling, no focal deficits or blurred vision. She has not had excessive bleeding from the site of extraction. She is able to move all her extremities.   Assessment & Plan:   Principal Problem:   Hyponatremia Active Problems:   Essential  hypertension   Hypothyroidism   CLL (chronic lymphocytic leukemia) (HCC)   Paroxysmal atrial fibrillation (HCC)   MCI (mild cognitive impairment)   Fall  # Symptomatic chronic hyponatremia Chronic hyponatremia likely siadh, no clear med or other cause, possible reset. Baseline low 130s. Recent dental extraction with resulting decreased po, as well as one episode diarrhea now resolved, thus likely some component of hypovolemia. Urine sodium elevated. Hyponatremia improved with fluid resuscitation but now stable in upper 120s. Symptoms have resolved.  Nephrology following. Cortisone and tsh wnl - discussed w/ nephrology, will trial adding salt tablets  # Paroxysmal a fib - cont xarelto  # HTN Here bp wnl - cont home meds  # Hypothyroid Tsh wnl - cont home synthroid  # CLL CBC stable - followed by heme/onc as outpt  # Recent dental surgery - cont amoxicillin through 8/24  DVT prophylaxis: xarelto Code Status: dnr Family Communication: daughter updated @ bedside 8/21  Level of care: Med-Surg Status is: Inpatient  Remains inpatient appropriate because:Inpatient level of care appropriate due to severity of illness  Dispo: The patient is from: Home              Anticipated d/c is to: Home              Patient currently is not medically stable to d/c.   Difficult to place patient No        Consultants:  nephrology  Procedures: none  Antimicrobials:  amoxicillin    Subjective: This morning  feels at baseline, no pain, has appetite, no diarrhea  Objective: Vitals:   02/13/21 0006 02/13/21 0346 02/13/21 0755 02/13/21 1111  BP: (!) 133/47 (!) 141/43 (!) 172/48 (!) 135/44  Pulse: 72 69 72 (!) 59  Resp: '18 18 17 16  '$ Temp: 98.5 F (36.9 C) 98.1 F (36.7 C) 98.2 F (36.8 C) (!) 97.4 F (36.3 C)  TempSrc: Oral     SpO2: 98% 98% 97% 97%  Weight:      Height:        Intake/Output Summary (Last 24 hours) at 02/13/2021 1326 Last data filed at 02/13/2021  1053 Gross per 24 hour  Intake 1400.8 ml  Output 2050 ml  Net -649.2 ml   Filed Weights   02/11/21 1039  Weight: 68 kg    Examination:  General exam: Appears calm and comfortable  Respiratory system: Clear to auscultation. Respiratory effort normal. Cardiovascular system: S1 & S2 heard, RRR. No JVD, murmurs, rubs, gallops or clicks. No pedal edema. Gastrointestinal system: Abdomen is nondistended, soft and nontender. No organomegaly or masses felt. Normal bowel sounds heard. Central nervous system: Alert and oriented. No focal neurological deficits. Extremities: Symmetric 5 x 5 power. Skin: No rashes, lesions or ulcers Psychiatry: Judgement and insight appear normal. Mood & affect appropriate.     Data Reviewed: I have personally reviewed following labs and imaging studies  CBC: Recent Labs  Lab 02/11/21 0220 02/12/21 0438  WBC 14.5* 10.0  NEUTROABS 9.9*  --   HGB 10.9* 10.5*  HCT 31.7* 30.7*  MCV 89.0 89.8  PLT 196 [id number]   Basic Metabolic Panel: Recent Labs  Lab 02/11/21 0220 02/11/21 1113 02/12/21 0438 02/13/21 0446  NA 122* 126* 129* 128*  K 3.6  --  3.9 3.6  CL 91*  --  100 101  CO2 22  --  25 22  GLUCOSE 142*  --  132* 113*  BUN 12  --  8 10  CREATININE 0.71  --  0.83 0.61  CALCIUM 8.8*  --  8.5* 8.3*   GFR: Estimated Creatinine Clearance: 39.7 mL/min (by C-G formula based on SCr of 0.61 mg/dL). Liver Function Tests: Recent Labs  Lab 02/11/21 0220  AST 25  ALT 19  ALKPHOS 33*  BILITOT 1.4*  PROT 7.1  ALBUMIN 3.9   No results for input(s): LIPASE, AMYLASE in the last 168 hours. No results for input(s): AMMONIA in the last 168 hours. Coagulation Profile: No results for input(s): INR, PROTIME in the last 168 hours. Cardiac Enzymes: No results for input(s): CKTOTAL, CKMB, CKMBINDEX, TROPONINI in the last 168 hours. BNP (last 3 results) No results for input(s): PROBNP in the last 8760 hours. HbA1C: No results for input(s): HGBA1C in the last  72 hours. CBG: No results for input(s): GLUCAP in the last 168 hours. Lipid Profile: No results for input(s): CHOL, HDL, LDLCALC, TRIG, CHOLHDL, LDLDIRECT in the last 72 hours. Thyroid Function Tests: Recent Labs    02/11/21 0220  TSH 4.253   Anemia Panel: No results for input(s): VITAMINB12, FOLATE, FERRITIN, TIBC, IRON, RETICCTPCT in the last 72 hours. Urine analysis:    Component Value Date/Time   COLORURINE YELLOW (A) 02/11/2021 0332   APPEARANCEUR CLEAR (A) 02/11/2021 0332   LABSPEC 1.008 02/11/2021 0332   PHURINE 7.0 02/11/2021 0332   GLUCOSEU NEGATIVE 02/11/2021 0332   HGBUR NEGATIVE 02/11/2021 0332   BILIRUBINUR NEGATIVE 02/11/2021 0332   BILIRUBINUR neg 02/25/2016 1013   KETONESUR 20 (A) 02/11/2021 0332   PROTEINUR NEGATIVE  02/11/2021 0332   UROBILINOGEN 0.2 02/25/2016 1013   NITRITE NEGATIVE 02/11/2021 0332   LEUKOCYTESUR NEGATIVE 02/11/2021 0332   Sepsis Labs: '@LABRCNTIP'$ (procalcitonin:4,lacticidven:4)  ) Recent Results (from the past 240 hour(s))  Resp Panel by RT-PCR (Flu A&B, Covid) Nasopharyngeal Swab     Status: None   Collection Time: 02/11/21  2:22 AM   Specimen: Nasopharyngeal Swab; Nasopharyngeal(NP) swabs in vial transport medium  Result Value Ref Range Status   SARS Coronavirus 2 by RT PCR NEGATIVE NEGATIVE Final    Comment: (NOTE) SARS-CoV-2 target nucleic acids are NOT DETECTED.  The SARS-CoV-2 RNA is generally detectable in upper respiratory specimens during the acute phase of infection. The lowest concentration of SARS-CoV-2 viral copies this assay can detect is 138 copies/mL. A negative result does not preclude SARS-Cov-2 infection and should not be used as the sole basis for treatment or other patient management decisions. A negative result may occur with  improper specimen collection/handling, submission of specimen other than nasopharyngeal swab, presence of viral mutation(s) within the areas targeted by this assay, and inadequate number  of viral copies(<138 copies/mL). A negative result must be combined with clinical observations, patient history, and epidemiological information. The expected result is Negative.  Fact Sheet for Patients:  EntrepreneurPulse.com.au  Fact Sheet for Healthcare Providers:  IncredibleEmployment.be  This test is no t yet approved or cleared by the [place] FDA and  has been authorized for detection and/or diagnosis of SARS-CoV-2 by FDA under an Emergency Use Authorization (EUA). This EUA will remain  in effect (meaning this test can be used) for the duration of the COVID-19 declaration under Section 564(b)(1) of the Act, 21 U.S.C.section 360bbb-3(b)(1), unless the authorization is terminated  or revoked sooner.       Influenza A by PCR NEGATIVE NEGATIVE Final   Influenza B by PCR NEGATIVE NEGATIVE Final    Comment: (NOTE) The Xpert Xpress SARS-CoV-2/FLU/RSV plus assay is intended as an aid in the diagnosis of influenza from Nasopharyngeal swab specimens and should not be used as a sole basis for treatment. Nasal washings and aspirates are unacceptable for Xpert Xpress SARS-CoV-2/FLU/RSV testing.  Fact Sheet for Patients: EntrepreneurPulse.com.au  Fact Sheet for Healthcare Providers: IncredibleEmployment.be  This test is not yet approved or cleared by the [place] FDA and has been authorized for detection and/or diagnosis of SARS-CoV-2 by FDA under an Emergency Use Authorization (EUA). This EUA will remain in effect (meaning this test can be used) for the duration of the COVID-19 declaration under Section 564(b)(1) of the Act, 21 U.S.C. section 360bbb-3(b)(1), unless the authorization is terminated or revoked.  Performed at [facility], [address], [place], [place] [address]          Radiology Studies: CT CHEST W CONTRAST  Result Date: 02/11/2021 CLINICAL DATA:   Hyponatremia, history of pulmonary nodules EXAM: CT CHEST WITH CONTRAST TECHNIQUE: Multidetector CT imaging of the chest was performed during intravenous contrast administration. CONTRAST:  8m OMNIPAQUE IOHEXOL 350 MG/ML SOLN COMPARISON:  02/11/2021, 11/07/2019 FINDINGS: Cardiovascular: The heart is unremarkable without pericardial effusion. No evidence of thoracic aortic aneurysm or dissection. Moderate atherosclerosis throughout the aorta, great vessels, and coronary vasculature. Mediastinum/Nodes: No enlarged mediastinal, hilar, or axillary lymph nodes. Thyroid gland, trachea, and esophagus demonstrate no significant findings. Lungs/Pleura: No acute airspace disease, effusion, or pneumothorax. Central airways are patent. There is a persistent 4 mm ground-glass nodule within the right lower lobe image 98/3, stable. A 3 mm ground-glass nodule within the right lower lobe image  101/3, stable. The remaining subcentimeter pulmonary nodule seen previously have either resolved or decreased in size since prior study. Based on long-term stability, no additional follow-up is required. Upper Abdomen: Multiple calcified gallstones are again identified. No evidence of acute cholecystitis. No other acute upper abdominal findings. Musculoskeletal: There are no acute or destructive bony lesions. Reconstructed images demonstrate no additional findings. IMPRESSION: 1. Subcentimeter pulmonary nodules as above, either stable, decreased, or resolved since prior studies. Given long-term stability, no specific follow-up is required at this time. This recommendation follows the consensus statement: Guidelines for Management of Incidental Pulmonary Nodules Detected on CT Images: From the Fleischner Society 2017; Radiology 2017; 284:228-243. 2. No acute intrathoracic process. 3. Aortic Atherosclerosis (ICD10-I70.0). Coronary artery atherosclerosis. 4. Cholelithiasis without cholecystitis. Electronically Signed   By: Randa Ngo M.D.    On: 02/11/2021 20:29        Scheduled Meds:  amoxicillin  500 mg Oral TID   atorvastatin  40 mg Oral QPM   calcium-vitamin D   Oral Daily   cholecalciferol  1,000 Units Oral Daily   famotidine  20 mg Oral Daily   ferrous sulfate  325 mg Oral BID WC   latanoprost  1 drop Right Eye QHS   levothyroxine  88 mcg Oral Q0600   lisinopril  20 mg Oral Daily   mouth rinse  15 mL Mouth Rinse BID   multivitamin with minerals   Oral Daily   omega-3 acid ethyl esters  1 g Oral Daily   polyethylene glycol  17 g Oral Daily   Rivaroxaban  15 mg Oral Q supper   sodium chloride flush  3 mL Intravenous Q12H   [START ON 02/14/2021] sodium chloride  2 g Oral BID WC   sodium chloride  4 g Oral Once   timolol  1 drop Right Eye BID   Continuous Infusions:  sodium chloride       LOS: 2 days    Time spent: 20 min    Desma Maxim, MD [facility]   If 7PM-7AM, please contact night-coverage [email or web address] Password [email or web address] 02/13/2021, 1:26 PM

## 2021-02-13 NOTE — Progress Notes (Signed)
[facility]  ROUNDING NOTE   Subjective:   Patient is alert and awake this morning. Eating food.   Na 129   NS infusion 19m/hr discontinued.   Objective:  Vital signs in last 24 hours:  Temp:  [97.4 F (36.3 C)-98.7 F (37.1 C)] 97.4 F (36.3 C) (08/21 1111) Pulse Rate:  [59-86] 59 (08/21 1111) Resp:  [16-19] 16 (08/21 1111) BP: (133-172)/(43-51) 135/44 (08/21 1111) SpO2:  [94 %-98 %] 97 % (08/21 1111)  Weight change:  Filed Weights   02/11/21 1039  Weight: 68 kg    Intake/Output: I/O last 3 completed shifts: In: 2110.8 [P.O.:1010; I.V.:1100.8] Out: 2500 [Urine:2500]   Intake/Output this shift:  Total I/O In: 240 [P.O.:240] Out: 800 [Urine:800]  Physical Exam: General: NAD, sitting up in bed  Head: Normocephalic, atraumatic. Moist  oral mucosal membranes  Eyes: Anicteric, PERRL  Neck: Supple, trachea midline  Lungs:  Clear to auscultation  Heart: Regular rate and rhythm  Abdomen:  Soft, nontender  Extremities:  no peripheral edema.  Neurologic: Nonfocal, moving all four extremities  Skin: No lesions        Basic Metabolic Panel: Recent Labs  Lab 02/11/21 0220 02/11/21 1113 02/12/21 0438 02/13/21 0446  NA 122* 126* 129* 128*  K 3.6  --  3.9 3.6  CL 91*  --  100 101  CO2 22  --  25 22  GLUCOSE 142*  --  132* 113*  BUN 12  --  8 10  CREATININE 0.71  --  0.83 0.61  CALCIUM 8.8*  --  8.5* 8.3*     Liver Function Tests: Recent Labs  Lab 02/11/21 0220  AST 25  ALT 19  ALKPHOS 33*  BILITOT 1.4*  PROT 7.1  ALBUMIN 3.9    No results for input(s): LIPASE, AMYLASE in the last 168 hours. No results for input(s): AMMONIA in the last 168 hours.  CBC: Recent Labs  Lab 02/11/21 0220 02/12/21 0438  WBC 14.5* 10.0  NEUTROABS 9.9*  --   HGB 10.9* 10.5*  HCT 31.7* 30.7*  MCV 89.0 89.8  PLT 196 191     Cardiac Enzymes: No results for input(s): CKTOTAL, CKMB, CKMBINDEX, TROPONINI in the last 168 hours.  BNP: Invalid  input(s): POCBNP  CBG: No results for input(s): GLUCAP in the last 168 hours.  Microbiology: Results for orders placed or performed during the hospital encounter of 02/11/21  Resp Panel by RT-PCR (Flu A&B, Covid) Nasopharyngeal Swab     Status: None   Collection Time: 02/11/21  2:22 AM   Specimen: Nasopharyngeal Swab; Nasopharyngeal(NP) swabs in vial transport medium  Result Value Ref Range Status   SARS Coronavirus 2 by RT PCR NEGATIVE NEGATIVE Final    Comment: (NOTE) SARS-CoV-2 target nucleic acids are NOT DETECTED.  The SARS-CoV-2 RNA is generally detectable in upper respiratory specimens during the acute phase of infection. The lowest concentration of SARS-CoV-2 viral copies this assay can detect is 138 copies/mL. A negative result does not preclude SARS-Cov-2 infection and should not be used as the sole basis for treatment or other patient management decisions. A negative result may occur with  improper specimen collection/handling, submission of specimen other than nasopharyngeal swab, presence of viral mutation(s) within the areas targeted by this assay, and inadequate number of viral copies(<138 copies/mL). A negative result must be combined with clinical observations, patient history, and epidemiological information. The expected result is Negative.  Fact Sheet for Patients:  hEntrepreneurPulse.com.au Fact Sheet for Healthcare Providers:  IncredibleEmployment.be  This test is no t yet approved or cleared by the [place] and  has been authorized for detection and/or diagnosis of SARS-CoV-2 by FDA under an Emergency Use Authorization (EUA). This EUA will remain  in effect (meaning this test can be used) for the duration of the COVID-19 declaration under Section 564(b)(1) of the Act, 21 U.S.C.section 360bbb-3(b)(1), unless the authorization is terminated  or revoked sooner.       Influenza A by PCR NEGATIVE NEGATIVE Final    Influenza B by PCR NEGATIVE NEGATIVE Final    Comment: (NOTE) The Xpert Xpress SARS-CoV-2/FLU/RSV plus assay is intended as an aid in the diagnosis of influenza from Nasopharyngeal swab specimens and should not be used as a sole basis for treatment. Nasal washings and aspirates are unacceptable for Xpert Xpress SARS-CoV-2/FLU/RSV testing.  Fact Sheet for Patients: EntrepreneurPulse.com.au  Fact Sheet for Healthcare Providers: IncredibleEmployment.be  This test is not yet approved or cleared by the [place] FDA and has been authorized for detection and/or diagnosis of SARS-CoV-2 by FDA under an Emergency Use Authorization (EUA). This EUA will remain in effect (meaning this test can be used) for the duration of the COVID-19 declaration under Section 564(b)(1) of the Act, 21 U.S.C. section 360bbb-3(b)(1), unless the authorization is terminated or revoked.  Performed at [facility], [address] [place], [place] [address]     Coagulation Studies: No results for input(s): LABPROT, INR in the last 72 hours.  Urinalysis: Recent Labs    02/11/21 0332  COLORURINE YELLOW*  LABSPEC 1.008  PHURINE 7.0  GLUCOSEU NEGATIVE  HGBUR NEGATIVE  BILIRUBINUR NEGATIVE  KETONESUR 20*  PROTEINUR NEGATIVE  NITRITE NEGATIVE  LEUKOCYTESUR NEGATIVE       Imaging: CT CHEST W CONTRAST  Result Date: 02/11/2021 CLINICAL DATA:  Hyponatremia, history of pulmonary nodules EXAM: CT CHEST WITH CONTRAST TECHNIQUE: Multidetector CT imaging of the chest was performed during intravenous contrast administration. CONTRAST:  80m OMNIPAQUE IOHEXOL 350 MG/ML SOLN COMPARISON:  02/11/2021, 11/07/2019 FINDINGS: Cardiovascular: The heart is unremarkable without pericardial effusion. No evidence of thoracic aortic aneurysm or dissection. Moderate atherosclerosis throughout the aorta, great vessels, and coronary vasculature. Mediastinum/Nodes: No enlarged  mediastinal, hilar, or axillary lymph nodes. Thyroid gland, trachea, and esophagus demonstrate no significant findings. Lungs/Pleura: No acute airspace disease, effusion, or pneumothorax. Central airways are patent. There is a persistent 4 mm ground-glass nodule within the right lower lobe image 98/3, stable. A 3 mm ground-glass nodule within the right lower lobe image 101/3, stable. The remaining subcentimeter pulmonary nodule seen previously have either resolved or decreased in size since prior study. Based on long-term stability, no additional follow-up is required. Upper Abdomen: Multiple calcified gallstones are again identified. No evidence of acute cholecystitis. No other acute upper abdominal findings. Musculoskeletal: There are no acute or destructive bony lesions. Reconstructed images demonstrate no additional findings. IMPRESSION: 1. Subcentimeter pulmonary nodules as above, either stable, decreased, or resolved since prior studies. Given long-term stability, no specific follow-up is required at this time. This recommendation follows the consensus statement: Guidelines for Management of Incidental Pulmonary Nodules Detected on CT Images: From the Fleischner Society 2017; Radiology 2017; 284:228-243. 2. No acute intrathoracic process. 3. Aortic Atherosclerosis (ICD10-I70.0). Coronary artery atherosclerosis. 4. Cholelithiasis without cholecystitis. Electronically Signed   By: MRanda NgoM.D.   On: 02/11/2021 20:29     Medications:    sodium chloride      amoxicillin  500 mg Oral TID   atorvastatin  40  mg Oral QPM   calcium-vitamin D   Oral Daily   cholecalciferol  1,000 Units Oral Daily   famotidine  20 mg Oral Daily   ferrous sulfate  325 mg Oral BID WC   latanoprost  1 drop Right Eye QHS   levothyroxine  88 mcg Oral Q0600   lisinopril  20 mg Oral Daily   mouth rinse  15 mL Mouth Rinse BID   multivitamin with minerals   Oral Daily   omega-3 acid ethyl esters  1 g Oral Daily    polyethylene glycol  17 g Oral Daily   Rivaroxaban  15 mg Oral Q supper   sodium chloride flush  3 mL Intravenous Q12H   [START ON 02/14/2021] sodium chloride  2 g Oral BID WC   sodium chloride  4 g Oral Once   timolol  1 drop Right Eye BID   sodium chloride, acetaminophen **OR** acetaminophen, ondansetron **OR** ondansetron (ZOFRAN) IV, sodium chloride flush  Assessment/ Plan:  [name] is a 87 y.o. white female CLL, hypothyroidism, proximal atrial fibrillation on anticoagulation, history of vasovagal syncope, hyperlipidemia, osteoporosis, GERD, glaucoma who was admitted to [facility] on 02/11/2021 for Hyponatremia [E87.1] Fall [W19.XXXA] Fall, initial encounter [id number]   1.  Hyponatremia Improved with normal saline infusion. 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  Encourage PO intake. Holding IV fluids. Do not recommend diuretics.   2.  Hypothyroidism. TSH at goal. Continue levothyroxine with current dosage.   3.  Hypertension: 135/44. Continue lisinopril. Also on amlodipine at home.      LOS: 2 Adelia Baptista 8/21/20221:57 PM

## 2021-02-14 DIAGNOSIS — E871 Hypo-osmolality and hyponatremia: Secondary | ICD-10-CM | POA: Diagnosis not present

## 2021-02-14 LAB — BASIC METABOLIC PANEL
Anion gap: 5 (ref 5–15)
BUN: 14 mg/dL (ref 8–23)
CO2: 23 mmol/L (ref 22–32)
Calcium: 8.2 mg/dL — ABNORMAL LOW (ref 8.9–10.3)
Chloride: 103 mmol/L (ref 98–111)
Creatinine, Ser: 0.52 mg/dL (ref 0.44–1.00)
GFR, Estimated: >60 mL/min (ref >60)
Glucose, Bld: 110 mg/dL — ABNORMAL HIGH (ref 70–99)
Potassium: 4 mmol/L (ref 3.5–5.1)
Sodium: 131 mmol/L — ABNORMAL LOW (ref 135–145)

## 2021-02-14 MED ORDER — SENNA 8.6 MG PO TABS
2.0000 | ORAL_TABLET | Freq: Every day | ORAL | Status: DC
  Administered 2021-02-14: 17.2 mg via ORAL
  Filled 2021-02-14: qty 2

## 2021-02-14 MED ORDER — LEVOTHYROXINE SODIUM 88 MCG PO TABS: 88.0000 ug | ORAL_TABLET | ORAL | Status: DC

## 2021-02-14 MED ORDER — SENNA 8.6 MG PO TABS: 2.0000 | ORAL_TABLET | Freq: Every day | ORAL | 0 refills | Status: DC

## 2021-02-14 MED ORDER — SODIUM CHLORIDE 1 G PO TABS: 2.0000 g | ORAL_TABLET | Freq: Two times a day (BID) | ORAL | 1 refills | Status: DC

## 2021-02-14 MED ORDER — LEVOTHYROXINE SODIUM 75 MCG PO TABS: 77.0000 ug | ORAL_TABLET | ORAL | Status: DC

## 2021-02-14 NOTE — Discharge Summary (Signed)
ADDI PAK TKW:[id number] DOB: [birth year] DOA: 02/11/2021  PCP: Kathrine Haddock, NP  Admit date: 02/11/2021 Discharge date: 02/14/2021  Time spent: 35 minutes  Recommendations for Outpatient Follow-up:  Pcp f/u one week, needs assessment of sodium level     Discharge Diagnoses:  Principal Problem:   Hyponatremia Active Problems:   Essential hypertension   Hypothyroidism   CLL (chronic lymphocytic leukemia) (HCC)   Paroxysmal atrial fibrillation (HCC)   MCI (mild cognitive impairment)   Fall   Discharge Condition: stable  Diet recommendation: regular  Filed Weights   02/11/21 1039  Weight: 68 kg    History of present illness:  Sydney Mcdaniel is a 87 y.o. female with medical history significant for CLL, hypertension, hypothyroidism, paroxysmal A. fib on anticoagulation, history of vasovagal syncope who presents to the ER via EMS for evaluation of weakness, confusion and an unwitnessed fall at home with complaints of pain in the left hip and back. Patient had 2 teeth extracted 24 hours prior to her presentation and according to the daughter she did not receive any anesthesia for the extraction.  She came home and was in her usual state of health until later in the evening when her life alert went off because she had fallen in her bedroom and was unable to get up.  Has son and daughter went to the house and were able to assist her back to bed.  Patient was extremely weak and unable to ambulate.  Her daughter got concerned when she became confused and was talking about things that were not present.  She called EMS and patient was brought to the ER for further evaluation. Patient does have a history of vasovagal syncope but according to the daughter she is usually diaphoretic prior to those episodes but that did not happen. During my evaluation patient is more awake, alert and oriented to person place and time.  She does not remember the events of the night before and cannot tell me  why she fell. She has had a cough and diarrhea but denies having any shortness of breath, no chest pain, no dizziness, no lightheadedness, no urinary frequency, no nocturia, no dysuria, no headache, no leg swelling, no focal deficits or blurred vision. She has not had excessive bleeding from the site of extraction. She is able to move all her extremities.  Hospital Course:  # Symptomatic chronic hyponatremia Chronic hyponatremia likely siadh, no clear med or other cause, possible reset. Baseline low 130s. Recent dental extraction with resulting decreased po, as well as one episode diarrhea now resolved, thus likely some component of hypovolemia. Urine sodium elevated. Hyponatremia improved with fluid resuscitation, and further with institution of salt tablets. Nephrology followed. Cortisone and tsn wnl - continue salt tablets - pcp f/u 1 week, sodium level then.   # Paroxysmal a fib - cont xarelto   # HTN Here bp wnl - cont home meds   # Hypothyroid Tsh wnl - cont home synthroid   # CLL CBC stable - followed by heme/onc as outpt   # Recent dental surgery - cont amoxicillin through 8/24    Procedures: none   Consultations: nephrology  Discharge Exam: Vitals:   02/14/21 0827 02/14/21 1144  BP: (!) 165/62 (!) 137/45  Pulse: 68 60  Resp: 14 16  Temp: 98 F (36.7 C) 97.9 F (36.6 C)  SpO2: 99% 100%    General exam: Appears calm and comfortable  Respiratory system: Clear to auscultation. Respiratory effort normal. Cardiovascular system: S1 &  S2 heard, RRR. No JVD, murmurs, rubs, gallops or clicks. No pedal edema. Gastrointestinal system: Abdomen is nondistended, soft and nontender. No organomegaly or masses felt. Normal bowel sounds heard. Central nervous system: Alert and oriented. No focal neurological deficits. Extremities: Symmetric 5 x 5 power. Skin: No rashes, lesions or ulcers Psychiatry: Judgement and insight appear normal. Mood & affect appropriate.    Discharge Instructions   Discharge Instructions     Diet general   Complete by: As directed    Face-to-face encounter (required for Medicare/Medicaid patients)   Complete by: As directed    I Desma Maxim certify that this patient is under my care and that I, or a nurse practitioner or physician's assistant working with me, had a face-to-face encounter that meets the physician face-to-face encounter requirements with this patient on 02/14/2021. The encounter with the patient was in whole, or in part for the following medical condition(s) which is the primary reason for home health care (List medical condition): leukemia   The encounter with the patient was in whole, or in part, for the following medical condition, which is the primary reason for home health care: leukemia   I certify that, based on my findings, the following services are medically necessary home health services: Physical therapy   Reason for Medically Necessary Home Health Services: Therapy- Personnel officer, Public librarian   My clinical findings support the need for the above services: Unable to leave home safely without assistance and/or assistive device   Further, I certify that my clinical findings support that this patient is homebound due to: Unable to leave home safely without assistance   Home Health   Complete by: As directed    To provide the following care/treatments: PT   Increase activity slowly   Complete by: As directed       Allergies as of 02/14/2021       Reactions   Other    Alphagan [brimonidine] Itching, Rash   Pravachol [pravastatin Sodium] Itching, Rash   Pravastatin Itching, Other (See Comments), Rash        Medication List     STOP taking these medications    amLODipine 2.5 MG tablet Commonly known as: NORVASC   B-12 1000 MCG/ML Kit   predniSONE 10 MG (21) Tbpk tablet Commonly known as: STERAPRED UNI-PAK 21 TAB       TAKE these medications    alendronate  70 MG tablet Commonly known as: FOSAMAX TAKE ONE TABLET BY MOUTH EVERY 7 DAYS. TAKE WITH A FULL GLASS OF WATER ON AN EMPTY STOMACH.   amoxicillin 500 MG capsule Commonly known as: AMOXIL Take 500 mg by mouth 3 (three) times daily.   atorvastatin 40 MG tablet Commonly known as: LIPITOR Take 1 tablet (40 mg total) by mouth daily.   CALCIUM 600 + D PO Take 1 tablet by mouth daily.   cholecalciferol 1000 units tablet Commonly known as: VITAMIN D Take 1,000 Units by mouth daily.   famotidine 20 MG tablet Commonly known as: PEPCID Take 20 mg by mouth daily.   ferrous sulfate 325 (65 FE) MG tablet Take 325 mg by mouth 2 (two) times daily with a meal.   Fish Oil 1200 MG Caps Take 1,200 mg by mouth daily.   latanoprost 0.005 % ophthalmic solution Commonly known as: XALATAN Place 1 drop into the right eye at bedtime.   levothyroxine 75 MCG tablet Commonly known as: Euthyrox TAKE ONE TABLET BY MOUTH ON MONDAYS, WEDNESDAYS, FRIDAYS,  AND SUNDAYS   levothyroxine 88 MCG tablet Commonly known as: Euthyrox TAKE 1 TABLET BY MOUTH ON TUESDAYS, THURSDAYS, AND SATURDAYS   lisinopril 20 MG tablet Commonly known as: ZESTRIL Take 1 tablet (20 mg total) by mouth daily.   MULTIVITAMIN ADULT PO Take 1 tablet by mouth daily.   polyethylene glycol 17 g packet Commonly known as: MIRALAX / GLYCOLAX Take 17 g by mouth daily.   Rivaroxaban 15 MG Tabs tablet Commonly known as: XARELTO Take 1 tablet (15 mg total) by mouth daily.   senna 8.6 MG Tabs tablet Commonly known as: SENOKOT Take 2 tablets (17.2 mg total) by mouth daily.   sodium chloride 1 g tablet Take 2 tablets (2 g total) by mouth 2 (two) times daily with a meal.   timolol 0.5 % ophthalmic solution Commonly known as: TIMOPTIC Place 1 drop into the right eye 2 (two) times daily.       Allergies  Allergen Reactions   Other    Alphagan [Brimonidine] Itching and Rash   Pravachol [Pravastatin Sodium] Itching and Rash    Pravastatin Itching, Other (See Comments) and Rash    Follow-up Information     Kathrine Haddock, NP Follow up.   Specialties: Nurse Practitioner, Family Medicine Why: 1-2 weeks Contact information: [address] [address] [phone]         Nelva Bush, MD .   Specialty: Cardiology Contact information: [place] [address] [phone]                  The results of significant diagnostics from this hospitalization (including imaging, microbiology, ancillary and laboratory) are listed below for reference.    Significant Diagnostic Studies: DG Chest 2 View  Result Date: 02/11/2021 CLINICAL DATA:  Un witnessed fall in home with chest pain, initial encounter EXAM: CHEST - 2 VIEW COMPARISON:  02/25/2019 FINDINGS: Cardiac shadow is within normal limits. Aortic calcifications are seen. The lungs are well aerated bilaterally. No focal infiltrate or sizable effusion is seen. No bony abnormality is noted. IMPRESSION: No acute abnormality noted. Electronically Signed   By: Inez Catalina M.D.   On: 02/11/2021 01:38   DG Lumbar Spine 2-3 Views  Result Date: 02/11/2021 CLINICAL DATA:  Recent fall with low back pain, initial encounter EXAM: LUMBAR SPINE - 3 VIEW COMPARISON:  06/26/2011 FINDINGS: Five lumbar type vertebral bodies are well visualized. Vertebral body height is well maintained. Multilevel disc space narrowing is noted. Mild retrolisthesis of L2 on L3 is noted. Aortic calcifications are noted. Multilevel osteophytic changes are seen. Calcification is noted to the left of the midline which may represent renal calculus. This measures approximately 8 mm. Triangular calcifications are noted in the right upper quadrant consistent with cholelithiasis. IMPRESSION: Degenerative change of the lumbar spine as described. Calcification to the left of the midline which may represent a renal calculus. Cholelithiasis is noted in the right upper quadrant.  Electronically Signed   By: Inez Catalina M.D.   On: 02/11/2021 01:40   CT HEAD WO CONTRAST (5MM)  Result Date: 02/11/2021 CLINICAL DATA:  Unwitnessed fall at home EXAM: CT HEAD WITHOUT CONTRAST CT CERVICAL SPINE WITHOUT CONTRAST TECHNIQUE: Multidetector CT imaging of the head and cervical spine was performed following the standard protocol without intravenous contrast. Multiplanar CT image reconstructions of the cervical spine were also generated. COMPARISON:  None. FINDINGS: CT HEAD FINDINGS Brain: No evidence of acute infarction, hemorrhage, hydrocephalus, extra-axial collection or mass lesion/mass effect. Brain atrophy.  Vascular: No hyperdense vessel or unexpected calcification. Skull: Normal. Negative for fracture or focal lesion. Sinuses/Orbits: No visible injury CT CERVICAL SPINE FINDINGS Alignment: No traumatic malalignment Skull base and vertebrae: No acute fracture Soft tissues and spinal canal: No prevertebral fluid or swelling. No visible canal hematoma. Disc levels:  Generalized degenerative endplate and facet spurring. Upper chest: No visible injury IMPRESSION: No evidence of acute intracranial or cervical spine injury. Electronically Signed   By: Monte Fantasia M.D.   On: 02/11/2021 04:33   CT CHEST W CONTRAST  Result Date: 02/11/2021 CLINICAL DATA:  Hyponatremia, history of pulmonary nodules EXAM: CT CHEST WITH CONTRAST TECHNIQUE: Multidetector CT imaging of the chest was performed during intravenous contrast administration. CONTRAST:  67m OMNIPAQUE IOHEXOL 350 MG/ML SOLN COMPARISON:  02/11/2021, 11/07/2019 FINDINGS: Cardiovascular: The heart is unremarkable without pericardial effusion. No evidence of thoracic aortic aneurysm or dissection. Moderate atherosclerosis throughout the aorta, great vessels, and coronary vasculature. Mediastinum/Nodes: No enlarged mediastinal, hilar, or axillary lymph nodes. Thyroid gland, trachea, and esophagus demonstrate no significant findings. Lungs/Pleura:  No acute airspace disease, effusion, or pneumothorax. Central airways are patent. There is a persistent 4 mm ground-glass nodule within the right lower lobe image 98/3, stable. A 3 mm ground-glass nodule within the right lower lobe image 101/3, stable. The remaining subcentimeter pulmonary nodule seen previously have either resolved or decreased in size since prior study. Based on long-term stability, no additional follow-up is required. Upper Abdomen: Multiple calcified gallstones are again identified. No evidence of acute cholecystitis. No other acute upper abdominal findings. Musculoskeletal: There are no acute or destructive bony lesions. Reconstructed images demonstrate no additional findings. IMPRESSION: 1. Subcentimeter pulmonary nodules as above, either stable, decreased, or resolved since prior studies. Given long-term stability, no specific follow-up is required at this time. This recommendation follows the consensus statement: Guidelines for Management of Incidental Pulmonary Nodules Detected on CT Images: From the Fleischner Society 2017; Radiology 2017; 284:228-243. 2. No acute intrathoracic process. 3. Aortic Atherosclerosis (ICD10-I70.0). Coronary artery atherosclerosis. 4. Cholelithiasis without cholecystitis. Electronically Signed   By: MRanda NgoM.D.   On: 02/11/2021 20:29   CT Cervical Spine Wo Contrast  Result Date: 02/11/2021 CLINICAL DATA:  Unwitnessed fall at home EXAM: CT HEAD WITHOUT CONTRAST CT CERVICAL SPINE WITHOUT CONTRAST TECHNIQUE: Multidetector CT imaging of the head and cervical spine was performed following the standard protocol without intravenous contrast. Multiplanar CT image reconstructions of the cervical spine were also generated. COMPARISON:  None. FINDINGS: CT HEAD FINDINGS Brain: No evidence of acute infarction, hemorrhage, hydrocephalus, extra-axial collection or mass lesion/mass effect. Brain atrophy. Vascular: No hyperdense vessel or unexpected calcification.  Skull: Normal. Negative for fracture or focal lesion. Sinuses/Orbits: No visible injury CT CERVICAL SPINE FINDINGS Alignment: No traumatic malalignment Skull base and vertebrae: No acute fracture Soft tissues and spinal canal: No prevertebral fluid or swelling. No visible canal hematoma. Disc levels:  Generalized degenerative endplate and facet spurring. Upper chest: No visible injury IMPRESSION: No evidence of acute intracranial or cervical spine injury. Electronically Signed   By: JMonte FantasiaM.D.   On: 02/11/2021 04:33   CT ABDOMEN PELVIS W CONTRAST  Result Date: 02/11/2021 CLINICAL DATA:  Acute abdominal pain, nonlocalized. Fever and diarrhea EXAM: CT ABDOMEN AND PELVIS WITH CONTRAST TECHNIQUE: Multidetector CT imaging of the abdomen and pelvis was performed using the standard protocol following bolus administration of intravenous contrast. CONTRAST:  765mOMNIPAQUE IOHEXOL 350 MG/ML SOLN COMPARISON:  04/11/2020 FINDINGS: Lower chest: No acute finding. Aortic and coronary atherosclerosis.  Hepatobiliary: No focal liver abnormality.Full gallbladder but no superimposed inflammatory changes seen in the wall or adjacent fat. Lamellated gallstones. No bile duct dilatation. Pancreas: Unremarkable. Spleen: Unremarkable. Adrenals/Urinary Tract: Negative adrenals. No hydronephrosis or stone. Full bladder. No superimposed acute finding. Stomach/Bowel: Prominent wall thickness of the sigmoid and distal descending colon is likely from collapse. There is however some perirectal mesenteric fat stranding and accentuated rectal mucosal enhancement. No obstruction. Appendectomy. Vascular/Lymphatic: No acute vascular abnormality. Diffuse atheromatous calcification of the aorta and branch vessels. No mass or adenopathy. Reproductive:Hysterectomy Other: No ascites or pneumoperitoneum. Musculoskeletal: No acute abnormalities. Advanced lumbar spine degeneration with high-grade L3-4 and L4-5 stenosis. Remote left pubic body  fracture. IMPRESSION: 1. Mild proctitis. 2. Distended urinary bladder. 3. Cholelithiasis. 4. History of fall with no visible injury. Electronically Signed   By: Monte Fantasia M.D.   On: 02/11/2021 04:38   DG HIP UNILAT W OR W/O PELVIS 2-3 VIEWS LEFT  Result Date: 02/11/2021 CLINICAL DATA:  Recent fall with left hip pain, initial encounter EXAM: DG HIP (WITH OR WITHOUT PELVIS) 2-3V LEFT COMPARISON:  None. FINDINGS: Pelvic ring is intact. Degenerative changes of the hip joints are noted bilaterally. No acute fracture or dislocation is noted. No soft tissue abnormality is seen. IMPRESSION: Degenerative change without acute abnormality Electronically Signed   By: Inez Catalina M.D.   On: 02/11/2021 01:41    Microbiology: Recent Results (from the past 240 hour(s))  Resp Panel by RT-PCR (Flu A&B, Covid) Nasopharyngeal Swab     Status: None   Collection Time: 02/11/21  2:22 AM   Specimen: Nasopharyngeal Swab; Nasopharyngeal(NP) swabs in vial transport medium  Result Value Ref Range Status   SARS Coronavirus 2 by RT PCR NEGATIVE NEGATIVE Final    Comment: (NOTE) SARS-CoV-2 target nucleic acids are NOT DETECTED.  The SARS-CoV-2 RNA is generally detectable in upper respiratory specimens during the acute phase of infection. The lowest concentration of SARS-CoV-2 viral copies this assay can detect is 138 copies/mL. A negative result does not preclude SARS-Cov-2 infection and should not be used as the sole basis for treatment or other patient management decisions. A negative result may occur with  improper specimen collection/handling, submission of specimen other than nasopharyngeal swab, presence of viral mutation(s) within the areas targeted by this assay, and inadequate number of viral copies(<138 copies/mL). A negative result must be combined with clinical observations, patient history, and epidemiological information. The expected result is Negative.  Fact Sheet for Patients:   EntrepreneurPulse.com.au  Fact Sheet for Healthcare Providers:  IncredibleEmployment.be  This test is no t yet approved or cleared by the [place] FDA and  has been authorized for detection and/or diagnosis of SARS-CoV-2 by FDA under an Emergency Use Authorization (EUA). This EUA will remain  in effect (meaning this test can be used) for the duration of the COVID-19 declaration under Section 564(b)(1) of the Act, 21 U.S.C.section 360bbb-3(b)(1), unless the authorization is terminated  or revoked sooner.       Influenza A by PCR NEGATIVE NEGATIVE Final   Influenza B by PCR NEGATIVE NEGATIVE Final    Comment: (NOTE) The Xpert Xpress SARS-CoV-2/FLU/RSV plus assay is intended as an aid in the diagnosis of influenza from Nasopharyngeal swab specimens and should not be used as a sole basis for treatment. Nasal washings and aspirates are unacceptable for Xpert Xpress SARS-CoV-2/FLU/RSV testing.  Fact Sheet for Patients: EntrepreneurPulse.com.au  Fact Sheet for Healthcare Providers: IncredibleEmployment.be  This test is not yet approved or cleared by the  [place] Architectural technologist and has been authorized for detection and/or diagnosis of SARS-CoV-2 by FDA under an Print production planner (EUA). This EUA will remain in effect (meaning this test can be used) for the duration of the COVID-19 declaration under Section 564(b)(1) of the Act, 21 U.S.C. section 360bbb-3(b)(1), unless the authorization is terminated or revoked.  Performed at [facility], [address] [place], [place] [address]      Labs: Basic Metabolic Panel: Recent Labs  Lab 02/11/21 0220 02/11/21 1113 02/12/21 0438 02/13/21 0446 02/14/21 0501  NA 122* 126* 129* 128* 131*  K 3.6  --  3.9 3.6 4.0  CL 91*  --  100 101 103  CO2 22  --  '25 22 23  ' GLUCOSE 142*  --  132* 113* 110*  BUN 12  --  '8 10 14  ' CREATININE 0.71  --  0.83  0.61 0.52  CALCIUM 8.8*  --  8.5* 8.3* 8.2*   Liver Function Tests: Recent Labs  Lab 02/11/21 0220  AST 25  ALT 19  ALKPHOS 33*  BILITOT 1.4*  PROT 7.1  ALBUMIN 3.9   No results for input(s): LIPASE, AMYLASE in the last 168 hours. No results for input(s): AMMONIA in the last 168 hours. CBC: Recent Labs  Lab 02/11/21 0220 02/12/21 0438  WBC 14.5* 10.0  NEUTROABS 9.9*  --   HGB 10.9* 10.5*  HCT 31.7* 30.7*  MCV 89.0 89.8  PLT 196 191   Cardiac Enzymes: No results for input(s): CKTOTAL, CKMB, CKMBINDEX, TROPONINI in the last 168 hours. BNP: BNP (last 3 results) No results for input(s): BNP in the last 8760 hours.  ProBNP (last 3 results) No results for input(s): PROBNP in the last 8760 hours.  CBG: No results for input(s): GLUCAP in the last 168 hours.     Signed:  Desma Maxim MD.  [facility] 02/14/2021, 12:36 PM

## 2021-02-14 NOTE — Care Management Important Message (Signed)
Important Message  Patient Details  Name: Sydney Mcdaniel MRN: [id number] Date of Birth: 1925/05/22   Medicare Important Message Given:  Yes     Dannette Barbara 02/14/2021, 11:25 AM

## 2021-02-14 NOTE — Progress Notes (Signed)
[facility]  ROUNDING NOTE   Subjective:   Patient is alert and awake this morning.  Eating breakfast Patient seen later sitting up in chair  Na 131    Objective:  Vital signs in last 24 hours:  Temp:  [97.4 F (36.3 C)-98.4 F (36.9 C)] 98 F (36.7 C) (08/22 0827) Pulse Rate:  [59-88] 68 (08/22 0827) Resp:  [14-18] 14 (08/22 0827) BP: (135-171)/(44-68) 165/62 (08/22 0827) SpO2:  [96 %-100 %] 99 % (08/22 0827)  Weight change:  Filed Weights   02/11/21 1039  Weight: 68 kg    Intake/Output: I/O last 3 completed shifts: In: 1460.8 [P.O.:540; I.V.:920.8] Out: 2850 [Urine:2850]   Intake/Output this shift:  No intake/output data recorded.  Physical Exam: General: NAD, sitting up in bed  Head: Normocephalic, atraumatic. Moist  oral mucosal membranes  Eyes: Anicteric  Lungs:  Clear to auscultation, normal effort  Heart: Regular rate and rhythm  Abdomen:  Soft, nontender  Extremities:  no peripheral edema.  Neurologic: Nonfocal, moving all four extremities  Skin: No lesions        Basic Metabolic Panel: Recent Labs  Lab 02/11/21 0220 02/11/21 1113 02/12/21 0438 02/13/21 0446 02/14/21 0501  NA 122* 126* 129* 128* 131*  K 3.6  --  3.9 3.6 4.0  CL 91*  --  100 101 103  CO2 22  --  '25 22 23  '$ GLUCOSE 142*  --  132* 113* 110*  BUN 12  --  '8 10 14  '$ CREATININE 0.71  --  0.83 0.61 0.52  CALCIUM 8.8*  --  8.5* 8.3* 8.2*     Liver Function Tests: Recent Labs  Lab 02/11/21 0220  AST 25  ALT 19  ALKPHOS 33*  BILITOT 1.4*  PROT 7.1  ALBUMIN 3.9    No results for input(s): LIPASE, AMYLASE in the last 168 hours. No results for input(s): AMMONIA in the last 168 hours.  CBC: Recent Labs  Lab 02/11/21 0220 02/12/21 0438  WBC 14.5* 10.0  NEUTROABS 9.9*  --   HGB 10.9* 10.5*  HCT 31.7* 30.7*  MCV 89.0 89.8  PLT 196 191     Cardiac Enzymes: No results for input(s): CKTOTAL, CKMB, CKMBINDEX, TROPONINI in the last 168  hours.  BNP: Invalid input(s): POCBNP  CBG: No results for input(s): GLUCAP in the last 168 hours.  Microbiology: Results for orders placed or performed during the hospital encounter of 02/11/21  Resp Panel by RT-PCR (Flu A&B, Covid) Nasopharyngeal Swab     Status: None   Collection Time: 02/11/21  2:22 AM   Specimen: Nasopharyngeal Swab; Nasopharyngeal(NP) swabs in vial transport medium  Result Value Ref Range Status   SARS Coronavirus 2 by RT PCR NEGATIVE NEGATIVE Final    Comment: (NOTE) SARS-CoV-2 target nucleic acids are NOT DETECTED.  The SARS-CoV-2 RNA is generally detectable in upper respiratory specimens during the acute phase of infection. The lowest concentration of SARS-CoV-2 viral copies this assay can detect is 138 copies/mL. A negative result does not preclude SARS-Cov-2 infection and should not be used as the sole basis for treatment or other patient management decisions. A negative result may occur with  improper specimen collection/handling, submission of specimen other than nasopharyngeal swab, presence of viral mutation(s) within the areas targeted by this assay, and inadequate number of viral copies(<138 copies/mL). A negative result must be combined with clinical observations, patient history, and epidemiological information. The expected result is Negative.  Fact Sheet for Patients:  EntrepreneurPulse.com.au  Fact  Sheet for Healthcare Providers:  IncredibleEmployment.be  This test is no t yet approved or cleared by the [place] FDA and  has been authorized for detection and/or diagnosis of SARS-CoV-2 by FDA under an Emergency Use Authorization (EUA). This EUA will remain  in effect (meaning this test can be used) for the duration of the COVID-19 declaration under Section 564(b)(1) of the Act, 21 U.S.C.section 360bbb-3(b)(1), unless the authorization is terminated  or revoked sooner.       Influenza A by PCR  NEGATIVE NEGATIVE Final   Influenza B by PCR NEGATIVE NEGATIVE Final    Comment: (NOTE) The Xpert Xpress SARS-CoV-2/FLU/RSV plus assay is intended as an aid in the diagnosis of influenza from Nasopharyngeal swab specimens and should not be used as a sole basis for treatment. Nasal washings and aspirates are unacceptable for Xpert Xpress SARS-CoV-2/FLU/RSV testing.  Fact Sheet for Patients: EntrepreneurPulse.com.au  Fact Sheet for Healthcare Providers: IncredibleEmployment.be  This test is not yet approved or cleared by the [place] FDA and has been authorized for detection and/or diagnosis of SARS-CoV-2 by FDA under an Emergency Use Authorization (EUA). This EUA will remain in effect (meaning this test can be used) for the duration of the COVID-19 declaration under Section 564(b)(1) of the Act, 21 U.S.C. section 360bbb-3(b)(1), unless the authorization is terminated or revoked.  Performed at [facility], [address] [place], [place] [address]     Coagulation Studies: No results for input(s): LABPROT, INR in the last 72 hours.  Urinalysis: No results for input(s): COLORURINE, LABSPEC, PHURINE, GLUCOSEU, HGBUR, BILIRUBINUR, KETONESUR, PROTEINUR, UROBILINOGEN, NITRITE, LEUKOCYTESUR in the last 72 hours.  Invalid input(s): APPERANCEUR     Imaging: No results found.   Medications:    sodium chloride      amoxicillin  500 mg Oral TID   atorvastatin  40 mg Oral QPM   calcium-vitamin D   Oral Daily   cholecalciferol  1,000 Units Oral Daily   famotidine  20 mg Oral Daily   ferrous sulfate  325 mg Oral BID WC   latanoprost  1 drop Right Eye QHS   levothyroxine  88 mcg Oral Q0600   lisinopril  20 mg Oral Daily   mouth rinse  15 mL Mouth Rinse BID   multivitamin with minerals   Oral Daily   omega-3 acid ethyl esters  1 g Oral Daily   polyethylene glycol  17 g Oral Daily   Rivaroxaban  15 mg Oral Q supper   senna  2  tablet Oral Daily   sodium chloride flush  3 mL Intravenous Q12H   sodium chloride  2 g Oral BID WC   timolol  1 drop Right Eye BID   sodium chloride, acetaminophen **OR** acetaminophen, ondansetron **OR** ondansetron (ZOFRAN) IV, sodium chloride flush  Assessment/ Plan:  [name] is a 87 y.o. white female CLL, hypothyroidism, proximal atrial fibrillation on anticoagulation, history of vasovagal syncope, hyperlipidemia, osteoporosis, GERD, glaucoma who was admitted to [facility] on 02/11/2021 for Hyponatremia [E87.1] Fall [W19.XXXA] Fall, initial encounter [id number]]   1.  Hyponatremia Improved with normal saline infusion. 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  PO intake improving. Holding IV fluids. Do not recommend diuretics. Clear to discharge from renal stance and follow up with PCP.  2.  Hypothyroidism. TSH at goal. Continue levothyroxine with current dosage.   3.  Hypertension: 165/62. Continue lisinopril. Also on amlodipine at home.      LOS: 3    8/22/202210:47 AM

## 2021-02-14 NOTE — Progress Notes (Signed)
Physical Therapy Treatment Patient Details Name: Sydney Mcdaniel MRN: [id number] DOB: [birth year] Today's Date: 02/14/2021    History of Present Illness 87 y.o. female with medical history significant for CLL, hypertension, hypothyroidism, paroxysmal A. fib on anticoagulation, history of vasovagal syncope who presents to the ER via EMS for evaluation of weakness, confusion and an unwitnessed fall at home with complaints of pain in the left hip and back.    PT Comments    OOB with increased time but no assist. Stood and is able to walk 76' with RW and min guard.  To bathroom for void.  Remained OOB in recliner after session with daughter and needs met.  Of note, pt did have a medium area of mostly clear mucous that fell when she stood.  Pt stated she has not had a BM since she has been here.  When care performed I did not see any more.  Assume it was from rectum.  Daughter aware.  Pt stated she received Miralax yesterday so nursing in addressing.   Follow Up Recommendations  Home health PT;Supervision - Intermittent     Equipment Recommendations  Rolling walker with 5" wheels    Recommendations for Other Services       Precautions / Restrictions Precautions Precautions: Fall Restrictions Weight Bearing Restrictions: No    Mobility  Bed Mobility Overal bed mobility: Needs Assistance Bed Mobility: Supine to Sit     Supine to sit: Min guard          Transfers Overall transfer level: Needs assistance Equipment used: Rolling walker (2 wheeled) Transfers: Sit to/from Stand Sit to Stand: Min guard            Ambulation/Gait Ambulation/Gait assistance: Min guard Gait Distance (Feet): 80 Feet Assistive device: Rolling walker (2 wheeled) Gait Pattern/deviations: Step-through pattern;Decreased step length - right;Decreased step length - left Gait velocity: steady   General Gait Details: slow but steady   Marine scientist  [place]in (Stroke Patients Only)       Balance Overall balance assessment: Modified Independent                                          Cognition Arousal/Alertness: Awake/alert Behavior During Therapy: WFL for tasks assessed/performed Overall Cognitive Status: Within Functional Limits for tasks assessed                                        Exercises Other Exercises Other Exercises: to commode to void    General Comments        Pertinent Vitals/Pain Pain Assessment: No/denies pain    Home Living                      Prior Function            PT Goals (current goals can now be found in the care plan section) Progress towards PT goals: Progressing toward goals    Frequency    Min 2X/week      PT Plan Current plan remains appropriate    Co-evaluation              AM-PAC PT "6 Clicks" Mobility   Outcome Measure  Help needed turning from your back to your side while in a flat bed without using bedrails?: A Little Help needed moving from lying on your back to sitting on the side of a flat bed without using bedrails?: A Little Help needed moving to and from a bed to a chair (including a wheelchair)?: A Little Help needed standing up from a chair using your arms (e.g., wheelchair or bedside chair)?: A Little Help needed to walk in hospital room?: A Little Help needed climbing 3-5 steps with a railing? : A Little 6 Click Score: 18    End of Session Equipment Utilized During Treatment: Gait belt Activity Tolerance: Patient tolerated treatment well Patient left: in chair;with call bell/phone within reach;with family/visitor present Nurse Communication: Mobility status PT Visit Diagnosis: Muscle weakness (generalized) (M62.81);Difficulty in walking, not elsewhere classified (R26.2)     Time: 3545-6256 PT Time Calculation (min) (ACUTE ONLY): 24 min  Charges:  $Gait Training: 8-22 mins $Therapeutic Activity: 8-22  mins                    Chesley Noon, PTA 02/14/21, 10:24 AM , 10:21 AM

## 2021-02-15 ENCOUNTER — Telehealth: Payer: Self-pay

## 2021-02-15 NOTE — Telephone Encounter (Signed)
Transition Care Management Follow-up Telephone Call Date of discharge and from where: 02/14/21 [facility] How have you been since you were released from the hospital? Pt states she is doing okay; ambulating and eating well Any questions or concerns? No  Items Reviewed: Did the pt receive and understand the discharge instructions provided? Yes  Medications obtained and verified? Yes  Other? No  Any new allergies since your discharge? No  Dietary orders reviewed? Yes Do you have support at home? Yes   Home Care and Equipment/Supplies: Were home health services ordered? yes If so, what is the name of the agency? [facility]  - PT Has the agency set up a time to come to the patient's home? no Were any new equipment or medical supplies ordered?  No  Functional Questionnaire: (I = Independent and D = Dependent) ADLs: D  Bathing/Dressing- D  Meal Prep- D  Eating- I  Maintaining continence- I  Transferring/Ambulation- I with walker  Managing Meds- D  Follow up appointments reviewed:  PCP Hospital f/u appt confirmed? Yes  Scheduled to see Kathrine Haddock NP on 03/03/21 @ 1:40. [facility] f/u appt confirmed? Yes  Scheduled to see cardiology on 02/16/21 @ 1:40. Are transportation arrangements needed? No  If their condition worsens, is the pt aware to call PCP or go to the Emergency Dept.? Yes Was the patient provided with contact information for the PCP's office or ED? Yes Was to pt encouraged to call back with questions or concerns? Yes

## 2021-02-16 ENCOUNTER — Other Ambulatory Visit: Payer: Self-pay

## 2021-02-16 ENCOUNTER — Encounter: Payer: Self-pay | Admitting: Internal Medicine

## 2021-02-16 ENCOUNTER — Ambulatory Visit (INDEPENDENT_AMBULATORY_CARE_PROVIDER_SITE_OTHER): Payer: Medicare Other | Admitting: Internal Medicine

## 2021-02-16 VITALS — BP 140/58 | HR 66 | Ht 62.0 in | Wt 144.0 lb

## 2021-02-16 DIAGNOSIS — I480 Paroxysmal atrial fibrillation: Secondary | ICD-10-CM

## 2021-02-16 DIAGNOSIS — I5032 Chronic diastolic (congestive) heart failure: Secondary | ICD-10-CM | POA: Diagnosis not present

## 2021-02-16 DIAGNOSIS — E871 Hypo-osmolality and hyponatremia: Secondary | ICD-10-CM

## 2021-02-16 DIAGNOSIS — I6523 Occlusion and stenosis of bilateral carotid arteries: Secondary | ICD-10-CM

## 2021-02-16 DIAGNOSIS — R55 Syncope and collapse: Secondary | ICD-10-CM

## 2021-02-16 DIAGNOSIS — I10 Essential (primary) hypertension: Secondary | ICD-10-CM

## 2021-02-16 NOTE — Patient Instructions (Signed)
Medication Instructions:   Your physician recommends that you continue on your current medications as directed. Please refer to the Current Medication list given to you today.  *If you need a refill on your cardiac medications before your next appointment, please call your pharmacy*   Lab Work:  BMET today  If you have labs (blood work) drawn today and your tests are completely normal, you will receive your results only by: [facility] (if you have MyChart) OR A paper copy in the mail If you have any lab test that is abnormal or we need to change your treatment, we will call you to review the results.   Testing/Procedures:  None ordered   Follow-Up: At [facility], you and your health needs are our priority.  As part of our continuing mission to provide you with exceptional heart care, we have created designated Provider Care Teams.  These Care Teams include your primary Cardiologist (physician) and Advanced Practice Providers (APPs -  Physician Assistants and Nurse Practitioners) who all work together to provide you with the care you need, when you need it.  We recommend signing up for the patient portal called "MyChart".  Sign up information is provided on this After Visit Summary.  MyChart is used to connect with patients for Virtual Visits (Telemedicine).  Patients are able to view lab/test results, encounter notes, upcoming appointments, etc.  Non-urgent messages can be sent to your provider as well.   To learn more about what you can do with MyChart, go to [email or web address].    Your next appointment:   3 month(s)  The format for your next appointment:   In Person  Provider:   You may see Nelva Bush, MD or one of the following Advanced Practice Providers on your designated Care Team:   [name], NP Christell Faith, PA-C Marrianne Mood, PA-C Cadence Coco, [place]

## 2021-02-16 NOTE — Progress Notes (Signed)
Follow-up Outpatient Visit Date: 02/16/2021  Primary Care Provider: Kathrine Haddock, NP [address] [place] [address]  Chief Complaint: Follow-up hospitalization for weakness and hyponatremia  HPI:  Sydney Mcdaniel is a 87 y.o. female with history of recurrent falls felt to be due to orthostatic hypotension and vasovagal syncope, paroxysmal atrial fibrillation, HFpEF, HTN, CLL, vitamin B12 deficiency, and anemia, who presents for hospital follow-up after recent fall.  She presented to the [facility] emergency department on 02/11/2021 with weakness, confusion, and unwitnessed fall in the setting of hyponatremia) 122 on admission) attributed to SIADH.  Notably, she had had a dental extraction earlier in the day.  Sodium had increased to 131 prior to discharge on 8/22.  Cardiology was not involved in her care during the hospital stay the patient and I are unclear as to why urgent follow-up was scheduled with [place].  Today, Sydney Mcdaniel reports that she is feeling much better though she is still not quite back to normal.  Leading up to her recent hospitalization, she had experienced some abdominal discomfort and diarrhea.  She has not had any further bowel movements since she was admitted to the hospital despite using MiraLAX and senna.  She has not had any chest pain, shortness of breath, palpitations, lightheadedness, or edema.  She was placed on salt tablets during her recent hospitalization and continues to use these.  Her appetite has been normal.  --------------------------------------------------------------------------------------------------  Cardiovascular History & Procedures: Cardiovascular Problems: Recurrent syncope Paroxysmal atrial fibrillation HFpEF   Risk Factors: Hypertension and age greater than 41   Cath/PCI: None   CV Surgery: None   EP Procedures and Devices: Event monitor (06/15/16): Dominantly sinus rhythm with paroxysmal atrial fibrillation (4% burden).  No significant  pauses. Event monitor (06/02/16): Normal sinus rhythm with first-degree AV block.  No significant arrhythmia. Holter monitor (04/17/16): Predominantly sinus rhythm with first-degree AV block.  PACs and short atrial runs lasting up to 4 beats noted.  Rare PVCs.   Non-Invasive Evaluation(s): Carotid Doppler (05/12/2020): Mild right (1-39%) and moderate left (40-59%) internal carotid artery stenoses.  Antegrade flow in both vertebral arteries.  Normal flow hemodynamics in both subclavian arteries. Pharmacologic MPI (02/26/19): Low risk study without ischemia.  Unable to assess LVEF secondary to GI uptake. TTE (02/26/19): Normal LV size and wall thickness.  LVEF 60-65% with grade 2 diastolic dysfunction.  Normal RV size and function.  Unable to assess RVSP.  No significant valvular abnormality. Carotid Doppler (05/02/18): Mild right ICA stenosis (1-39%).  Moderate left ICA stenosis (40-59%). Carotid Doppler (04/30/17): Moderate (40-59%) proximal ICA stenoses bilaterally.  Greater than 50% left external carotid artery stenosis.  No significant change from prior study in 04/2016. TTE (04/17/16): Normal LV size.  LVEF 55-60%.  Normal wall motion and diastolic function.  Mild aortic and mitral regurgitation.  Normal RV size and function.  Normal pulmonary artery pressure. Pharmacologic MPI (04/10/16): Low risk study with small fixed defect at the apex, likely due to breast attenuation.  No ischemia.  LVEF 55-65%.  Recent CV Pertinent Labs: Lab Results  Component Value Date   CHOL 171 11/17/2020   HDL 55 11/17/2020   LDLCALC 90 11/17/2020   LDLCALC 90 07/02/2018   TRIG 147 11/17/2020   CHOLHDL 3.1 11/17/2020   CHOLHDL 3.1 07/30/2019   K 4.2 02/16/2021   MG 2.0 04/13/2020   BUN 17 02/16/2021   CREATININE 0.69 02/16/2021   CREATININE 0.79 10/01/2020    Past medical and surgical history were reviewed and updated in  EPIC.  Current Meds  Medication Sig   alendronate (FOSAMAX) 70 MG tablet TAKE ONE  TABLET BY MOUTH EVERY 7 DAYS. TAKE WITH A FULL GLASS OF WATER ON AN EMPTY STOMACH.   amoxicillin (AMOXIL) 500 MG capsule Take 500 mg by mouth 3 (three) times daily.   atorvastatin (LIPITOR) 40 MG tablet Take 1 tablet (40 mg total) by mouth daily.   Calcium Carb-Cholecalciferol (CALCIUM 600 + D PO) Take 1 tablet by mouth daily.    cholecalciferol (VITAMIN D) 1000 units tablet Take 1,000 Units by mouth daily.   famotidine (PEPCID) 20 MG tablet Take 20 mg by mouth daily.   ferrous sulfate 325 (65 FE) MG tablet Take 325 mg by mouth 2 (two) times daily with a meal.   latanoprost (XALATAN) 0.005 % ophthalmic solution Place 1 drop into the right eye at bedtime.    levothyroxine (EUTHYROX) 75 MCG tablet TAKE ONE TABLET BY MOUTH ON MONDAYS, WEDNESDAYS, FRIDAYS, AND SUNDAYS   levothyroxine (EUTHYROX) 88 MCG tablet TAKE 1 TABLET BY MOUTH ON TUESDAYS, THURSDAYS, AND SATURDAYS   lisinopril (ZESTRIL) 20 MG tablet Take 1 tablet (20 mg total) by mouth daily.   Multiple Vitamins-Minerals (MULTIVITAMIN ADULT PO) Take 1 tablet by mouth daily.   Omega-3 Fatty Acids (FISH OIL) 1200 MG CAPS Take 1,200 mg by mouth daily.    polyethylene glycol (MIRALAX / GLYCOLAX) 17 g packet Take 17 g by mouth daily.   Rivaroxaban (XARELTO) 15 MG TABS tablet Take 1 tablet (15 mg total) by mouth daily.   senna (SENOKOT) 8.6 MG TABS tablet Take 2 tablets (17.2 mg total) by mouth daily.   sodium chloride 1 g tablet Take 2 tablets (2 g total) by mouth 2 (two) times daily with a meal.   timolol (TIMOPTIC) 0.5 % ophthalmic solution Place 1 drop into the right eye 2 (two) times daily.     Allergies: Other, Alphagan [brimonidine], Pravachol [pravastatin sodium], and Pravastatin  Social History   Tobacco Use   Smoking status: Never   Smokeless tobacco: Never  Vaping Use   Vaping Use: Never used  Substance Use Topics   Alcohol use: No   Drug use: No    Family History  Problem Relation Age of Onset   Heart attack Mother     Glaucoma Mother    Heart attack Father    Parkinson's disease Brother    Heart attack Brother     Review of Systems: A 12-system review of systems was performed and was negative except as noted in the HPI.  --------------------------------------------------------------------------------------------------  Physical Exam: BP (!) 140/58 (BP Location: Left Arm, Patient Position: Sitting, Cuff Size: Normal)   Pulse 66   Ht '5\' 2"'$  (1.575 m)   Wt 144 lb (65.3 kg)   LMP  (LMP Unknown)   SpO2 98%   BMI 26.34 kg/m   General:  NAD.  Accompanied by her daughter. Neck: No JVD or HJR. Lungs: Clear to auscultation bilaterally without wheezes or crackles. Heart: Regular rate and rhythm without murmurs, rubs, or gallops. Abdomen: Soft, nontender, nondistended. Extremities: 1+ ankle edema bilaterally  EKG: Normal sinus rhythm with first-degree AV block, PACs, and left bundle branch block.  Compared with prior tracing from 02/11/2021, PVCs are no longer present.  Lab Results  Component Value Date   WBC 10.0 02/12/2021   HGB 10.5 (L) 02/12/2021   HCT 30.7 (L) 02/12/2021   MCV 89.8 02/12/2021   PLT 191 02/12/2021    Lab Results  Component Value Date  NA 136 02/16/2021   K 4.2 02/16/2021   CL 101 02/16/2021   CO2 19 (L) 02/16/2021   BUN 17 02/16/2021   CREATININE 0.69 02/16/2021   GLUCOSE 124 (H) 02/16/2021   ALT 19 02/11/2021    Lab Results  Component Value Date   CHOL 171 11/17/2020   HDL 55 11/17/2020   LDLCALC 90 11/17/2020   TRIG 147 11/17/2020   CHOLHDL 3.1 11/17/2020    --------------------------------------------------------------------------------------------------  ASSESSMENT AND PLAN: Vasovagal syncope: No episodes since our last visit.  I encouraged Ms. Pavon's to stay well-hydrated.  No further intervention planned at this time.  Paroxysmal atrial fibrillation: EKG today shows sinus rhythm with PACs.  We will plan to continue low-dose rivaroxaban for stroke  prevention.  No AV nodal blocking agents given history of bradycardia and first-degree AV block.  Chronic HFpEF: Ms. Breton has 1+ ankle edema on examination today, likely exacerbated by ongoing sodium supplementation for recent hyponatremia.  We will check a BMP today.  If sodium is stable/improved compared with recent discharge labs, we will plan to discontinue sodium supplementation.  Hyponatremia: Noted at the time of recent admission in the setting of preceding GI distress, diarrhea, profound weakness, and confusion.  Notably, Ms. Bridwell had undergone dental extractions earlier that day and was taking amoxicillin.  She had otherwise felt well and had been eating normally.  Question if diarrhea was contributing or if this reflected SIADH.  Nonetheless, I would favor discontinuation of sodium supplementation if her serum sodium is stable/improved, as I do not think that her hyponatremia was caused by poor sodium intake.  Ongoing follow-up with PCP recommended.  Hypertension: Blood pressure borderline elevated today.  We will defer medication changes today and except mild permissive hypertension recurrent vasovagal syncope.  Carotid artery stenosis: Mild to moderate plaquing noted on prior study in 04/2020.  Continue medical therapy to prevent progression of disease with plans to repeat a carotid Doppler this fall.  Follow-up: Return to clinic in 3 months.  Nelva Bush, MD 02/18/2021 7:40 AM

## 2021-02-17 ENCOUNTER — Telehealth: Payer: Self-pay | Admitting: Unknown Physician Specialty

## 2021-02-17 DIAGNOSIS — H409 Unspecified glaucoma: Secondary | ICD-10-CM | POA: Diagnosis not present

## 2021-02-17 DIAGNOSIS — G3184 Mild cognitive impairment, so stated: Secondary | ICD-10-CM | POA: Diagnosis not present

## 2021-02-17 DIAGNOSIS — D638 Anemia in other chronic diseases classified elsewhere: Secondary | ICD-10-CM | POA: Diagnosis not present

## 2021-02-17 DIAGNOSIS — E785 Hyperlipidemia, unspecified: Secondary | ICD-10-CM | POA: Diagnosis not present

## 2021-02-17 DIAGNOSIS — E871 Hypo-osmolality and hyponatremia: Secondary | ICD-10-CM | POA: Diagnosis not present

## 2021-02-17 DIAGNOSIS — Z7901 Long term (current) use of anticoagulants: Secondary | ICD-10-CM | POA: Diagnosis not present

## 2021-02-17 DIAGNOSIS — M549 Dorsalgia, unspecified: Secondary | ICD-10-CM | POA: Diagnosis not present

## 2021-02-17 DIAGNOSIS — I480 Paroxysmal atrial fibrillation: Secondary | ICD-10-CM | POA: Diagnosis not present

## 2021-02-17 DIAGNOSIS — L900 Lichen sclerosus et atrophicus: Secondary | ICD-10-CM | POA: Diagnosis not present

## 2021-02-17 DIAGNOSIS — R55 Syncope and collapse: Secondary | ICD-10-CM | POA: Diagnosis not present

## 2021-02-17 DIAGNOSIS — R7301 Impaired fasting glucose: Secondary | ICD-10-CM | POA: Diagnosis not present

## 2021-02-17 DIAGNOSIS — K219 Gastro-esophageal reflux disease without esophagitis: Secondary | ICD-10-CM | POA: Diagnosis not present

## 2021-02-17 DIAGNOSIS — E039 Hypothyroidism, unspecified: Secondary | ICD-10-CM | POA: Diagnosis not present

## 2021-02-17 DIAGNOSIS — H269 Unspecified cataract: Secondary | ICD-10-CM | POA: Diagnosis not present

## 2021-02-17 DIAGNOSIS — I10 Essential (primary) hypertension: Secondary | ICD-10-CM | POA: Diagnosis not present

## 2021-02-17 DIAGNOSIS — C9190 Lymphoid leukemia, unspecified not having achieved remission: Secondary | ICD-10-CM | POA: Diagnosis not present

## 2021-02-17 DIAGNOSIS — M25552 Pain in left hip: Secondary | ICD-10-CM | POA: Diagnosis not present

## 2021-02-17 DIAGNOSIS — M810 Age-related osteoporosis without current pathological fracture: Secondary | ICD-10-CM | POA: Diagnosis not present

## 2021-02-17 LAB — BASIC METABOLIC PANEL
BUN/Creatinine Ratio: 25 (ref 12–28)
BUN: 17 mg/dL (ref 10–36)
CO2: 19 mmol/L — ABNORMAL LOW (ref 20–29)
Calcium: 8.9 mg/dL (ref 8.7–10.3)
Chloride: 101 mmol/L (ref 96–106)
Creatinine, Ser: 0.69 mg/dL (ref 0.57–1.00)
Glucose: 124 mg/dL — ABNORMAL HIGH (ref 65–99)
Potassium: 4.2 mmol/L (ref 3.5–5.2)
Sodium: 136 mmol/L (ref 134–144)
eGFR: 81 mL/min/{1.73_m2} (ref >59)

## 2021-02-17 NOTE — Telephone Encounter (Signed)
Copied from [place] [id number]. Topic: Quick Communication - Home Health Verbal Orders >> Feb 17, 2021  1:39 PM Jodie Echevaria wrote: Caller/Agency: [facility] Number: [phone] Requesting OT/PT/Skilled Nursing/Social Work/Speech Therapy: PT and OT  Frequency: evaluation PT 2 w 4,  1 w 4

## 2021-02-17 NOTE — Telephone Encounter (Signed)
Not a [facility] patient.  

## 2021-02-18 ENCOUNTER — Telehealth: Payer: Self-pay | Admitting: *Deleted

## 2021-02-18 ENCOUNTER — Encounter: Payer: Self-pay | Admitting: Internal Medicine

## 2021-02-18 NOTE — Telephone Encounter (Signed)
Spoke to pt's daughter, [name] (DPR approved), made aware of lab results and Dr. Darnelle Bos recc.  [name] voiced understanding and will make pt aware.  Pt will stop taking sodium chloride tablets and will continue remaining medications.  No further questions at this time.

## 2021-02-18 NOTE — Telephone Encounter (Signed)
-----   Message from Nelva Bush, MD sent at 02/17/2021  7:00 AM EDT ----- Please let Ms. Calderone and her daughter know that her sodium is back up to normal (136).  I recommend that she stop taking salt tablets (sodium chloride) but otherwise continue the remained of her medications.

## 2021-02-22 ENCOUNTER — Telehealth: Payer: Self-pay

## 2021-02-22 NOTE — Telephone Encounter (Signed)
Verbal orders sent to wrong office. Please advise.

## 2021-02-22 NOTE — Telephone Encounter (Signed)
Copied from [place] [id number]. Topic: General - Inquiry >> Feb 22, 2021  9:34 AM Oneta Rack wrote: Caller name: Tiffany  Relation to pt: Clinical Manager of [facility]  Call back number: [phone] option [phone]    Reason for call:  Verbal order for PT 1x 1, 2x 4 1x 4

## 2021-02-23 ENCOUNTER — Inpatient Hospital Stay: Payer: Medicare Other | Attending: Oncology

## 2021-02-23 ENCOUNTER — Telehealth: Payer: Self-pay

## 2021-02-23 ENCOUNTER — Other Ambulatory Visit: Payer: Self-pay

## 2021-02-23 ENCOUNTER — Inpatient Hospital Stay (HOSPITAL_BASED_OUTPATIENT_CLINIC_OR_DEPARTMENT_OTHER): Payer: Medicare Other | Admitting: Oncology

## 2021-02-23 VITALS — BP 179/56 | HR 65 | Temp 98.4°F | Resp 18 | Wt 138.5 lb

## 2021-02-23 DIAGNOSIS — C9110 Chronic lymphocytic leukemia of B-cell type not having achieved remission: Secondary | ICD-10-CM | POA: Insufficient documentation

## 2021-02-23 DIAGNOSIS — E871 Hypo-osmolality and hyponatremia: Secondary | ICD-10-CM | POA: Insufficient documentation

## 2021-02-23 DIAGNOSIS — D649 Anemia, unspecified: Secondary | ICD-10-CM | POA: Insufficient documentation

## 2021-02-23 DIAGNOSIS — E538 Deficiency of other specified B group vitamins: Secondary | ICD-10-CM

## 2021-02-23 DIAGNOSIS — M549 Dorsalgia, unspecified: Secondary | ICD-10-CM | POA: Diagnosis not present

## 2021-02-23 DIAGNOSIS — C9190 Lymphoid leukemia, unspecified not having achieved remission: Secondary | ICD-10-CM | POA: Diagnosis not present

## 2021-02-23 DIAGNOSIS — I10 Essential (primary) hypertension: Secondary | ICD-10-CM | POA: Diagnosis not present

## 2021-02-23 DIAGNOSIS — M25552 Pain in left hip: Secondary | ICD-10-CM | POA: Diagnosis not present

## 2021-02-23 DIAGNOSIS — I480 Paroxysmal atrial fibrillation: Secondary | ICD-10-CM | POA: Diagnosis not present

## 2021-02-23 LAB — COMPREHENSIVE METABOLIC PANEL
ALT: 18 U/L (ref 0–44)
AST: 23 U/L (ref 15–41)
Albumin: 3.3 g/dL — ABNORMAL LOW (ref 3.5–5.0)
Alkaline Phosphatase: 33 U/L — ABNORMAL LOW (ref 38–126)
Anion gap: 5 (ref 5–15)
BUN: 12 mg/dL (ref 8–23)
CO2: 26 mmol/L (ref 22–32)
Calcium: 8.8 mg/dL — ABNORMAL LOW (ref 8.9–10.3)
Chloride: 100 mmol/L (ref 98–111)
Creatinine, Ser: 0.8 mg/dL (ref 0.44–1.00)
GFR, Estimated: >60 mL/min (ref >60)
Glucose, Bld: 120 mg/dL — ABNORMAL HIGH (ref 70–99)
Potassium: 4.4 mmol/L (ref 3.5–5.1)
Sodium: 131 mmol/L — ABNORMAL LOW (ref 135–145)
Total Bilirubin: 0.4 mg/dL (ref 0.3–1.2)
Total Protein: 6.6 g/dL (ref 6.5–8.1)

## 2021-02-23 LAB — RETIC PANEL
Immature Retic Fract: 12.4 % (ref 2.3–15.9)
RBC.: 3.17 MIL/uL — ABNORMAL LOW (ref 3.87–5.11)
Retic Count, Absolute: 62.4 10*3/uL (ref 19.0–186.0)
Retic Ct Pct: 2 % (ref 0.4–3.1)
Reticulocyte Hemoglobin: 30.6 pg (ref >27.9)

## 2021-02-23 LAB — CBC WITH DIFFERENTIAL/PLATELET
Abs Immature Granulocytes: 0.03 10*3/uL (ref 0.00–0.07)
Basophils Absolute: 0 10*3/uL (ref 0.0–0.1)
Basophils Relative: 0 %
Eosinophils Absolute: 0.2 10*3/uL (ref 0.0–0.5)
Eosinophils Relative: 2 %
HCT: 29.4 % — ABNORMAL LOW (ref 36.0–46.0)
Hemoglobin: 9.8 g/dL — ABNORMAL LOW (ref 12.0–15.0)
Immature Granulocytes: 0 %
Lymphocytes Relative: 31 %
Lymphs Abs: 3.1 10*3/uL (ref 0.7–4.0)
MCH: 30.2 pg (ref 26.0–34.0)
MCHC: 33.3 g/dL (ref 30.0–36.0)
MCV: 90.7 fL (ref 80.0–100.0)
Monocytes Absolute: 0.7 10*3/uL (ref 0.1–1.0)
Monocytes Relative: 7 %
Neutro Abs: 6 10*3/uL (ref 1.7–7.7)
Neutrophils Relative %: 60 %
Platelets: 241 10*3/uL (ref 150–400)
RBC: 3.24 MIL/uL — ABNORMAL LOW (ref 3.87–5.11)
RDW: 15.2 % (ref 11.5–15.5)
WBC: 10 10*3/uL (ref 4.0–10.5)
nRBC: 0 % (ref 0.0–0.2)

## 2021-02-23 LAB — FERRITIN: Ferritin: 87 ng/mL (ref 11–307)

## 2021-02-23 LAB — IRON AND TIBC
Iron: 32 ug/dL (ref 28–170)
Saturation Ratios: 12 % (ref 10.4–31.8)
TIBC: 269 ug/dL (ref 250–450)
UIBC: 237 ug/dL

## 2021-02-23 LAB — VITAMIN B12: Vitamin B-12: 906 pg/mL (ref 180–914)

## 2021-02-23 NOTE — Progress Notes (Signed)
Patient here for follow up today. Patient using walker and moving very slowly. Patient appears lethargic and not at her baseline. Recent hospitalization for vasovagal syncope with fall. Patient was treated for low sodium levels in the hospital.

## 2021-02-23 NOTE — Telephone Encounter (Signed)
Copied from [place] [id number]. Topic: General - Other >> Feb 23, 2021  4:22 PM Leward Quan A wrote: Reason for CRM: Tommy PTA with [facility] called in to inform Kathrine Haddock that he was unable to treat patient today due to her BP being too high. Patient took her Amlodipine when she heard that her BP was elevated.   Any questions please call  Ph# [phone]

## 2021-02-23 NOTE — Progress Notes (Signed)
Hematology/Oncology Consult note [facility]  [phone] [phone]  Patient Care Team: Sindy Guadeloupe, MD as PCP - General (Oncology) End, Harrell Gave, MD as PCP - Cardiology (Cardiology) Anell Barr, OD (Optometry)   Name of the patient: Sydney Mcdaniel  [id number]  January 06, 1925   Date of visit: 02/23/21  Diagnosis-Rai stage 0 CLL B12 deficiency and normocytic anemia  Chief complaint/ Reason for visit-routine follow-up of CLL and anemia   History of present illness- Patient is a 87 year old female who was diagnosed with Rai stage 0 CLL in in 2016. Peripheral blood flow cytometry revealed involvement by a CD5+, CD23+, CD38- monoclonal B cell population with lambda light chain restriction consistent with CLL/SLL.  There was a small population (8% of the T cells) of double positive (CD4+/CD8+) T cells (described in association with chronic viral infections, autoimmune disorders, chronic inflammatory disorders, and immunodeficiency states).  She has not required any treatment for leukemia so far  Patient has had chronic normocytic anemia with a hemoglobin that fluctuates between 10-11 at least dating back to 2017.  She has a history of B12 deficiency and is on monthly B12 injections.  Iron studies have been normal in the past.  Reticulocyte count was normal indicative of hypoproliferative anemia.  Coombs test has been positive in the past but patient has had a normal to low reticulocyte count, normal LDH and no elevated bilirubin  Patient also has chronic hyponatremia and stable pulmonary nodules which do not require further follow-up  Interval history: Patient was recently admitted to the hospital for hyponatremia and weakness and discharged on 02/14/2021.  Patient currently reports some ongoing fatigue but overall improved as compared to her recent hospitalization.  ECOG PS- 2 Pain scale- 0  Review of systems- Review of Systems   Constitutional:  Positive for malaise/fatigue. Negative for chills, fever and weight loss.  HENT:  Negative for congestion, ear discharge and nosebleeds.   Eyes:  Negative for blurred vision.  Respiratory:  Negative for cough, hemoptysis, sputum production, shortness of breath and wheezing.   Cardiovascular:  Negative for chest pain, palpitations, orthopnea and claudication.  Gastrointestinal:  Negative for abdominal pain, blood in stool, constipation, diarrhea, heartburn, melena, nausea and vomiting.  Genitourinary:  Negative for dysuria, flank pain, frequency, hematuria and urgency.  Musculoskeletal:  Negative for back pain, joint pain and myalgias.  Skin:  Negative for rash.  Neurological:  Negative for dizziness, tingling, focal weakness, seizures, weakness and headaches.  Endo/Heme/Allergies:  Does not bruise/bleed easily.  Psychiatric/Behavioral:  Negative for depression and suicidal ideas. The patient does not have insomnia.       Allergies  Allergen Reactions   Other    Alphagan [Brimonidine] Itching and Rash   Pravachol [Pravastatin Sodium] Itching and Rash   Pravastatin Itching, Other (See Comments) and Rash     Past Medical History:  Diagnosis Date   Allergy    Anemia    Cataract    CLL (chronic lymphocytic leukemia) (HCC)    GERD (gastroesophageal reflux disease)    Glaucoma    H/O: hysterectomy    Total   Hyperlipidemia    Hypertension    Hypothyroidism    Impaired fasting glucose    Lichen sclerosus    Osteoporosis    Hips   PAF (paroxysmal atrial fibrillation) ([place])    a. 06/15/2016 Event monitor: 4% afib burden; b. CHA2DS2VASc - 4-->Xarelto.   Syncope    a. 03/2016 Echo: EF55-60%, no rwma, mild AI/MR,  nl PASP; b. 03/2016 48h Holter: no significant arrhythmias/pauses; c. 03/2016 MV: mild apical defect, likely breast attenuation, nl EF, low risk; d. 05/2016 Event monitor: No significant arrhythmia; e. 05/2016 Event monitor: PAF (4%).     Past Surgical  History:  Procedure Laterality Date   APPENDECTOMY     EYE SURGERY     Glaucoma   MULTIPLE TOOTH EXTRACTIONS N/A    2 teeth   TOTAL ABDOMINAL HYSTERECTOMY      Social History   Socioeconomic History   Marital status: Widowed    Spouse name: Not on file   Number of children: 2   Years of education: Not on file   Highest education level: High school graduate  Occupational History   Occupation: retired  Tobacco Use   Smoking status: Never   Smokeless tobacco: Never  Vaping Use   Vaping Use: Never used  Substance and Sexual Activity   Alcohol use: No   Drug use: No   Sexual activity: Not Currently  Other Topics Concern   Not on file  Social History Narrative   Pt lives alone.    Social Determinants of Health   Financial Resource Strain: Low Risk    Difficulty of Paying Living Expenses: Not hard at all  Food Insecurity: No Food Insecurity   Worried About Charity fundraiser in the Last Year: Never true   [place] in the Last Year: Never true  Transportation Needs: No Transportation Needs   Lack of Transportation (Medical): No   Lack of Transportation (Non-Medical): No  Physical Activity: Inactive   Days of Exercise per Week: 0 days   Minutes of Exercise per Session: 0 min  Stress: No Stress Concern Present   Feeling of Stress : Not at all  Social Connections: Moderately Isolated   Frequency of Communication with Friends and Family: More than three times a week   Frequency of Social Gatherings with Friends and Family: More than three times a week   Attends Religious Services: More than 4 times per year   Active Member of [employer] or Organizations: No   Attends Archivist Meetings: Never   Marital Status: Widowed  Human resources officer Violence: Not At Risk   Fear of Current or Ex-Partner: No   Emotionally Abused: No   Physically Abused: No   Sexually Abused: No    Family History  Problem Relation Age of Onset   Heart attack Mother    Glaucoma  Mother    Heart attack Father    Parkinson's disease Brother    Heart attack Brother      Current Outpatient Medications:    alendronate (FOSAMAX) 70 MG tablet, TAKE ONE TABLET BY MOUTH EVERY 7 DAYS. TAKE WITH A FULL GLASS OF WATER ON AN EMPTY STOMACH., Disp: 12 tablet, Rfl: 3   amoxicillin (AMOXIL) 500 MG capsule, Take 500 mg by mouth 3 (three) times daily., Disp: , Rfl:    atorvastatin (LIPITOR) 40 MG tablet, Take 1 tablet (40 mg total) by mouth daily., Disp: 90 tablet, Rfl: 1   Calcium Carb-Cholecalciferol (CALCIUM 600 + D PO), Take 1 tablet by mouth daily. , Disp: , Rfl:    cholecalciferol (VITAMIN D) 1000 units tablet, Take 1,000 Units by mouth daily., Disp: , Rfl:    famotidine (PEPCID) 20 MG tablet, Take 20 mg by mouth daily., Disp: , Rfl:    ferrous sulfate 325 (65 FE) MG tablet, Take 325 mg by mouth 2 (two) times daily with  a meal., Disp: , Rfl:    latanoprost (XALATAN) 0.005 % ophthalmic solution, Place 1 drop into the right eye at bedtime. , Disp: , Rfl:    levothyroxine (EUTHYROX) 75 MCG tablet, TAKE ONE TABLET BY MOUTH ON MONDAYS, WEDNESDAYS, FRIDAYS, AND SUNDAYS, Disp: 52 tablet, Rfl: 2   levothyroxine (EUTHYROX) 88 MCG tablet, TAKE 1 TABLET BY MOUTH ON TUESDAYS, THURSDAYS, AND SATURDAYS, Disp: 39 tablet, Rfl: 2   lisinopril (ZESTRIL) 20 MG tablet, Take 1 tablet (20 mg total) by mouth daily., Disp: 90 tablet, Rfl: 1   Multiple Vitamins-Minerals (MULTIVITAMIN ADULT PO), Take 1 tablet by mouth daily., Disp: , Rfl:    Omega-3 Fatty Acids (FISH OIL) 1200 MG CAPS, Take 1,200 mg by mouth daily. , Disp: , Rfl:    polyethylene glycol (MIRALAX / GLYCOLAX) 17 g packet, Take 17 g by mouth daily., Disp: , Rfl:    Rivaroxaban (XARELTO) 15 MG TABS tablet, Take 1 tablet (15 mg total) by mouth daily., Disp: 90 tablet, Rfl: 2   senna (SENOKOT) 8.6 MG TABS tablet, Take 2 tablets (17.2 mg total) by mouth daily., Disp: 120 tablet, Rfl: 0   timolol (TIMOPTIC) 0.5 % ophthalmic solution, Place 1 drop  into the right eye 2 (two) times daily. , Disp: , Rfl:   Physical exam:  Vitals:   02/23/21 0923  Weight: 138 lb 8 oz (62.8 kg)   Physical Exam Constitutional:      General: She is not in acute distress. Cardiovascular:     Rate and Rhythm: Normal rate and regular rhythm.     Heart sounds: Normal heart sounds.  Pulmonary:     Effort: Pulmonary effort is normal.     Breath sounds: Normal breath sounds.  Abdominal:     General: Bowel sounds are normal.     Palpations: Abdomen is soft.  Skin:    General: Skin is warm and dry.  Neurological:     Mental Status: She is alert and oriented to person, place, and time.     CMP Latest Ref Rng & Units 02/16/2021  Glucose 65 - 99 mg/dL 124(H)  BUN 10 - 36 mg/dL 17  Creatinine 0.57 - 1.00 mg/dL 0.69  Sodium 134 - 144 mmol/L 136  Potassium 3.5 - 5.2 mmol/L 4.2  Chloride 96 - 106 mmol/L 101  CO2 20 - 29 mmol/L 19(L)  Calcium 8.7 - 10.3 mg/dL 8.9  Total Protein 6.5 - 8.1 g/dL -  Total Bilirubin 0.3 - 1.2 mg/dL -  Alkaline Phos 38 - 126 U/L -  AST 15 - 41 U/L -  ALT 0 - 44 U/L -   CBC Latest Ref Rng & Units 02/23/2021  WBC 4.0 - 10.5 K/uL 10.0  Hemoglobin 12.0 - 15.0 g/dL 9.8(L)  Hematocrit 36.0 - 46.0 % 29.4(L)  Platelets 150 - 400 K/uL 241    No images are attached to the encounter.  DG Chest 2 View  Result Date: 02/11/2021 CLINICAL DATA:  Un witnessed fall in home with chest pain, initial encounter EXAM: CHEST - 2 VIEW COMPARISON:  02/25/2019 FINDINGS: Cardiac shadow is within normal limits. Aortic calcifications are seen. The lungs are well aerated bilaterally. No focal infiltrate or sizable effusion is seen. No bony abnormality is noted. IMPRESSION: No acute abnormality noted. Electronically Signed   By: Inez Catalina M.D.   On: 02/11/2021 01:38   DG Lumbar Spine 2-3 Views  Result Date: 02/11/2021 CLINICAL DATA:  Recent fall with low back pain, initial encounter EXAM: LUMBAR  SPINE - 3 VIEW COMPARISON:  06/26/2011 FINDINGS: Five  lumbar type vertebral bodies are well visualized. Vertebral body height is well maintained. Multilevel disc space narrowing is noted. Mild retrolisthesis of L2 on L3 is noted. Aortic calcifications are noted. Multilevel osteophytic changes are seen. Calcification is noted to the left of the midline which may represent renal calculus. This measures approximately 8 mm. Triangular calcifications are noted in the right upper quadrant consistent with cholelithiasis. IMPRESSION: Degenerative change of the lumbar spine as described. Calcification to the left of the midline which may represent a renal calculus. Cholelithiasis is noted in the right upper quadrant. Electronically Signed   By: Inez Catalina M.D.   On: 02/11/2021 01:40   CT HEAD WO CONTRAST (5MM)  Result Date: 02/11/2021 CLINICAL DATA:  Unwitnessed fall at home EXAM: CT HEAD WITHOUT CONTRAST CT CERVICAL SPINE WITHOUT CONTRAST TECHNIQUE: Multidetector CT imaging of the head and cervical spine was performed following the standard protocol without intravenous contrast. Multiplanar CT image reconstructions of the cervical spine were also generated. COMPARISON:  None. FINDINGS: CT HEAD FINDINGS Brain: No evidence of acute infarction, hemorrhage, hydrocephalus, extra-axial collection or mass lesion/mass effect. Brain atrophy. Vascular: No hyperdense vessel or unexpected calcification. Skull: Normal. Negative for fracture or focal lesion. Sinuses/Orbits: No visible injury CT CERVICAL SPINE FINDINGS Alignment: No traumatic malalignment Skull base and vertebrae: No acute fracture Soft tissues and spinal canal: No prevertebral fluid or swelling. No visible canal hematoma. Disc levels:  Generalized degenerative endplate and facet spurring. Upper chest: No visible injury IMPRESSION: No evidence of acute intracranial or cervical spine injury. Electronically Signed   By: Monte Fantasia M.D.   On: 02/11/2021 04:33   CT CHEST W CONTRAST  Result Date:  02/11/2021 CLINICAL DATA:  Hyponatremia, history of pulmonary nodules EXAM: CT CHEST WITH CONTRAST TECHNIQUE: Multidetector CT imaging of the chest was performed during intravenous contrast administration. CONTRAST:  26m OMNIPAQUE IOHEXOL 350 MG/ML SOLN COMPARISON:  02/11/2021, 11/07/2019 FINDINGS: Cardiovascular: The heart is unremarkable without pericardial effusion. No evidence of thoracic aortic aneurysm or dissection. Moderate atherosclerosis throughout the aorta, great vessels, and coronary vasculature. Mediastinum/Nodes: No enlarged mediastinal, hilar, or axillary lymph nodes. Thyroid gland, trachea, and esophagus demonstrate no significant findings. Lungs/Pleura: No acute airspace disease, effusion, or pneumothorax. Central airways are patent. There is a persistent 4 mm ground-glass nodule within the right lower lobe image 98/3, stable. A 3 mm ground-glass nodule within the right lower lobe image 101/3, stable. The remaining subcentimeter pulmonary nodule seen previously have either resolved or decreased in size since prior study. Based on long-term stability, no additional follow-up is required. Upper Abdomen: Multiple calcified gallstones are again identified. No evidence of acute cholecystitis. No other acute upper abdominal findings. Musculoskeletal: There are no acute or destructive bony lesions. Reconstructed images demonstrate no additional findings. IMPRESSION: 1. Subcentimeter pulmonary nodules as above, either stable, decreased, or resolved since prior studies. Given long-term stability, no specific follow-up is required at this time. This recommendation follows the consensus statement: Guidelines for Management of Incidental Pulmonary Nodules Detected on CT Images: From the Fleischner Society 2017; Radiology 2017; 284:228-243. 2. No acute intrathoracic process. 3. Aortic Atherosclerosis (ICD10-I70.0). Coronary artery atherosclerosis. 4. Cholelithiasis without cholecystitis. Electronically Signed    By: MRanda NgoM.D.   On: 02/11/2021 20:29   CT Cervical Spine Wo Contrast  Result Date: 02/11/2021 CLINICAL DATA:  Unwitnessed fall at home EXAM: CT HEAD WITHOUT CONTRAST CT CERVICAL SPINE WITHOUT CONTRAST TECHNIQUE: Multidetector CT imaging of the head  and cervical spine was performed following the standard protocol without intravenous contrast. Multiplanar CT image reconstructions of the cervical spine were also generated. COMPARISON:  None. FINDINGS: CT HEAD FINDINGS Brain: No evidence of acute infarction, hemorrhage, hydrocephalus, extra-axial collection or mass lesion/mass effect. Brain atrophy. Vascular: No hyperdense vessel or unexpected calcification. Skull: Normal. Negative for fracture or focal lesion. Sinuses/Orbits: No visible injury CT CERVICAL SPINE FINDINGS Alignment: No traumatic malalignment Skull base and vertebrae: No acute fracture Soft tissues and spinal canal: No prevertebral fluid or swelling. No visible canal hematoma. Disc levels:  Generalized degenerative endplate and facet spurring. Upper chest: No visible injury IMPRESSION: No evidence of acute intracranial or cervical spine injury. Electronically Signed   By: Monte Fantasia M.D.   On: 02/11/2021 04:33   CT ABDOMEN PELVIS W CONTRAST  Result Date: 02/11/2021 CLINICAL DATA:  Acute abdominal pain, nonlocalized. Fever and diarrhea EXAM: CT ABDOMEN AND PELVIS WITH CONTRAST TECHNIQUE: Multidetector CT imaging of the abdomen and pelvis was performed using the standard protocol following bolus administration of intravenous contrast. CONTRAST:  55m OMNIPAQUE IOHEXOL 350 MG/ML SOLN COMPARISON:  04/11/2020 FINDINGS: Lower chest: No acute finding. Aortic and coronary atherosclerosis. Hepatobiliary: No focal liver abnormality.Full gallbladder but no superimposed inflammatory changes seen in the wall or adjacent fat. Lamellated gallstones. No bile duct dilatation. Pancreas: Unremarkable. Spleen: Unremarkable. Adrenals/Urinary Tract:  Negative adrenals. No hydronephrosis or stone. Full bladder. No superimposed acute finding. Stomach/Bowel: Prominent wall thickness of the sigmoid and distal descending colon is likely from collapse. There is however some perirectal mesenteric fat stranding and accentuated rectal mucosal enhancement. No obstruction. Appendectomy. Vascular/Lymphatic: No acute vascular abnormality. Diffuse atheromatous calcification of the aorta and branch vessels. No mass or adenopathy. Reproductive:Hysterectomy Other: No ascites or pneumoperitoneum. Musculoskeletal: No acute abnormalities. Advanced lumbar spine degeneration with high-grade L3-4 and L4-5 stenosis. Remote left pubic body fracture. IMPRESSION: 1. Mild proctitis. 2. Distended urinary bladder. 3. Cholelithiasis. 4. History of fall with no visible injury. Electronically Signed   By: JMonte FantasiaM.D.   On: 02/11/2021 04:38   DG HIP UNILAT W OR W/O PELVIS 2-3 VIEWS LEFT  Result Date: 02/11/2021 CLINICAL DATA:  Recent fall with left hip pain, initial encounter EXAM: DG HIP (WITH OR WITHOUT PELVIS) 2-3V LEFT COMPARISON:  None. FINDINGS: Pelvic ring is intact. Degenerative changes of the hip joints are noted bilaterally. No acute fracture or dislocation is noted. No soft tissue abnormality is seen. IMPRESSION: Degenerative change without acute abnormality Electronically Signed   By: MInez CatalinaM.D.   On: 02/11/2021 01:41     Assessment and plan- Patient is a 87y.o. female who is here for follow-up of following issues:  Rai stage 0 CLL:  patient has a normal white cell count with a normal absolute lymphocyte count.  Platelet count is normal.  She does have chronic anemia which I do not think is related to her CLL given the normal white count and platelet count.  No palpable splenomegaly or bulky lymphadenopathy  Normocytic anemia: Although patient has had numerous positive tests in the past she did not have an elevated reticulocyte count LDH Or bilirubin to  suggest hemolysis.  No evidence of CKD.  TSH normal.  Today her hemoglobin is somewhat lower than her baseline at 9.8.  Iron studies B12 folate and reticulocyte count are still pending.  I will await above blood work to come back and if there is no evidence of iron or B12 deficiency I am still inclined to monitor her  hemoglobin conservatively with a repeat CBC, LDH, reticulocyte count, haptoglobin, CMP and Coombs test in 3 months time  Hyponatremia: Follow-up with PCP    Visit Diagnosis 1. CLL (chronic lymphocytic leukemia) ([place])   2. Normocytic anemia      Dr. Randa Evens, MD, MPH [facility] [id number] 02/23/2021 9:25 AM

## 2021-02-24 LAB — FOLATE: Folate: 56 ng/mL (ref >5.9)

## 2021-02-24 NOTE — Telephone Encounter (Signed)
Gave verbal order to [place] from [facility] that pt Sydney Mcdaniel request was approved by Kathrine Haddock.

## 2021-03-01 DIAGNOSIS — M549 Dorsalgia, unspecified: Secondary | ICD-10-CM | POA: Diagnosis not present

## 2021-03-01 DIAGNOSIS — I10 Essential (primary) hypertension: Secondary | ICD-10-CM | POA: Diagnosis not present

## 2021-03-01 DIAGNOSIS — I480 Paroxysmal atrial fibrillation: Secondary | ICD-10-CM | POA: Diagnosis not present

## 2021-03-01 DIAGNOSIS — C9190 Lymphoid leukemia, unspecified not having achieved remission: Secondary | ICD-10-CM | POA: Diagnosis not present

## 2021-03-01 DIAGNOSIS — E871 Hypo-osmolality and hyponatremia: Secondary | ICD-10-CM | POA: Diagnosis not present

## 2021-03-01 DIAGNOSIS — M25552 Pain in left hip: Secondary | ICD-10-CM | POA: Diagnosis not present

## 2021-03-03 ENCOUNTER — Encounter: Payer: Self-pay | Admitting: Unknown Physician Specialty

## 2021-03-03 ENCOUNTER — Other Ambulatory Visit: Payer: Self-pay

## 2021-03-03 ENCOUNTER — Ambulatory Visit (INDEPENDENT_AMBULATORY_CARE_PROVIDER_SITE_OTHER): Payer: Medicare Other | Admitting: Unknown Physician Specialty

## 2021-03-03 VITALS — BP 142/70 | HR 76 | Temp 98.0°F | Resp 16 | Ht 62.0 in | Wt 131.1 lb

## 2021-03-03 DIAGNOSIS — I6523 Occlusion and stenosis of bilateral carotid arteries: Secondary | ICD-10-CM | POA: Diagnosis not present

## 2021-03-03 DIAGNOSIS — Z79899 Other long term (current) drug therapy: Secondary | ICD-10-CM | POA: Diagnosis not present

## 2021-03-03 DIAGNOSIS — M549 Dorsalgia, unspecified: Secondary | ICD-10-CM | POA: Diagnosis not present

## 2021-03-03 DIAGNOSIS — I10 Essential (primary) hypertension: Secondary | ICD-10-CM | POA: Diagnosis not present

## 2021-03-03 DIAGNOSIS — R634 Abnormal weight loss: Secondary | ICD-10-CM

## 2021-03-03 DIAGNOSIS — E871 Hypo-osmolality and hyponatremia: Secondary | ICD-10-CM | POA: Diagnosis not present

## 2021-03-03 DIAGNOSIS — M545 Low back pain, unspecified: Secondary | ICD-10-CM

## 2021-03-03 DIAGNOSIS — C9190 Lymphoid leukemia, unspecified not having achieved remission: Secondary | ICD-10-CM | POA: Diagnosis not present

## 2021-03-03 DIAGNOSIS — I480 Paroxysmal atrial fibrillation: Secondary | ICD-10-CM | POA: Diagnosis not present

## 2021-03-03 DIAGNOSIS — M25552 Pain in left hip: Secondary | ICD-10-CM | POA: Diagnosis not present

## 2021-03-03 NOTE — Progress Notes (Signed)
BP (!) 142/70   Pulse 76   Temp 98 F (36.7 C)   Resp 16   Ht '5\' 2"'$  (1.575 m)   Wt 131 lb 1.6 oz (59.5 kg)   LMP  (LMP Unknown)   SpO2 99%   BMI 23.98 kg/m    Subjective:    Patient ID: Sydney Mcdaniel, female    DOB: 1924/12/18, 87 y.o.   MRN: [id number]  HPI: Sydney Mcdaniel is a 87 y.o. female  Chief Complaint  Patient presents with   Hospitalization Follow-up    Recommendations for Outpatient Follow-up:  1. Pcp f/u one week, needs assessment of sodium level    Pt is here for f/u in the hospital following a fall.  There are several concerns:    BP has been variable with it being high with the physical therapist.  Taking 1/2 of Amlodipine if BP is high.  Sometimes takes a whole pill.    Hip has been bothering her ever since the fall. X-rays in the ER were normal.    Hyponatremia - noted in the ER.  Was on salt tablets on discharge.  Dr End stopped the salt tablets.    Polypharmacy- Feels like she is taking too much medication.    Weight loss- lost 12 pounds in less than one month.      Relevant past medical, surgical, family and social history reviewed and updated as indicated. Interim medical history since our last visit reviewed. Allergies and medications reviewed and updated.  Review of Systems  Per HPI unless specifically indicated above     Objective:    BP (!) 142/70   Pulse 76   Temp 98 F (36.7 C)   Resp 16   Ht '5\' 2"'$  (1.575 m)   Wt 131 lb 1.6 oz (59.5 kg)   LMP  (LMP Unknown)   SpO2 99%   BMI 23.98 kg/m   Wt Readings from Last 3 Encounters:  03/03/21 131 lb 1.6 oz (59.5 kg)  02/23/21 138 lb 8 oz (62.8 kg)  02/16/21 144 lb (65.3 kg)    Physical Exam Constitutional:      General: She is not in acute distress.    Appearance: Normal appearance. She is well-developed.  HENT:     Head: Normocephalic and atraumatic.  Eyes:     General: Lids are normal. No scleral icterus.       Right eye: No discharge.        Left eye: No discharge.      Conjunctiva/sclera: Conjunctivae normal.  Neck:     Vascular: No carotid bruit or JVD.  Cardiovascular:     Rate and Rhythm: Normal rate and regular rhythm.     Heart sounds: Normal heart sounds.  Pulmonary:     Effort: Pulmonary effort is normal. No respiratory distress.     Breath sounds: Normal breath sounds.  Abdominal:     Palpations: There is no hepatomegaly or splenomegaly.  Musculoskeletal:     Cervical back: Normal range of motion and neck supple.     Comments: Tender back of right hip  Skin:    General: Skin is warm and dry.     Coloration: Skin is not pale.     Findings: No rash.  Neurological:     Mental Status: She is alert and oriented to person, place, and time.  Psychiatric:        Behavior: Behavior normal.        Thought Content: Thought  content normal.        Judgment: Judgment normal.    Results for orders placed or performed in visit on 02/23/21  Folate  Result Value Ref Range   Folate 56.0 >5.9 ng/mL  Vitamin B12  Result Value Ref Range   Vitamin B-12 906 180 - 914 pg/mL  Retic Panel  Result Value Ref Range   Retic Ct Pct 2.0 0.4 - 3.1 %   RBC. 3.17 (L) 3.87 - 5.11 MIL/uL   Retic Count, Absolute 62.4 19.0 - 186.0 K/uL   Immature Retic Fract 12.4 2.3 - 15.9 %   Reticulocyte Hemoglobin 30.6 >27.9 pg  Iron and TIBC  Result Value Ref Range   Iron 32 28 - 170 ug/dL   TIBC 269 250 - 450 ug/dL   Saturation Ratios 12 10.4 - 31.8 %   UIBC 237 ug/dL  Ferritin  Result Value Ref Range   Ferritin 87 11 - 307 ng/mL  CBC with Differential/Platelet  Result Value Ref Range   WBC 10.0 4.0 - 10.5 K/uL   RBC 3.24 (L) 3.87 - 5.11 MIL/uL   Hemoglobin 9.8 (L) 12.0 - 15.0 g/dL   HCT 29.4 (L) 36.0 - 46.0 %   MCV 90.7 80.0 - 100.0 fL   MCH 30.2 26.0 - 34.0 pg   MCHC 33.3 30.0 - 36.0 g/dL   RDW 15.2 11.5 - 15.5 %   Platelets 241 150 - 400 K/uL   nRBC 0.0 0.0 - 0.2 %   Neutrophils Relative % 60 %   Neutro Abs 6.0 1.7 - 7.7 K/uL   Lymphocytes Relative 31 %    Lymphs Abs 3.1 0.7 - 4.0 K/uL   Monocytes Relative 7 %   Monocytes Absolute 0.7 0.1 - 1.0 K/uL   Eosinophils Relative 2 %   Eosinophils Absolute 0.2 0.0 - 0.5 K/uL   Basophils Relative 0 %   Basophils Absolute 0.0 0.0 - 0.1 K/uL   Immature Granulocytes 0 %   Abs Immature Granulocytes 0.03 0.00 - 0.07 K/uL  Comprehensive metabolic panel  Result Value Ref Range   Sodium 131 (L) 135 - 145 mmol/L   Potassium 4.4 3.5 - 5.1 mmol/L   Chloride 100 98 - 111 mmol/L   CO2 26 22 - 32 mmol/L   Glucose, Bld 120 (H) 70 - 99 mg/dL   BUN 12 8 - 23 mg/dL   Creatinine, Ser 0.80 0.44 - 1.00 mg/dL   Calcium 8.8 (L) 8.9 - 10.3 mg/dL   Total Protein 6.6 6.5 - 8.1 g/dL   Albumin 3.3 (L) 3.5 - 5.0 g/dL   AST 23 15 - 41 U/L   ALT 18 0 - 44 U/L   Alkaline Phosphatase 33 (L) 38 - 126 U/L   Total Bilirubin 0.4 0.3 - 1.2 mg/dL   GFR, Estimated >60 >60 mL/min   Anion gap 5 5 - 15      Assessment & Plan:   Problem List Items Addressed This Visit       Unprioritized   Hyponatremia    Check labs today.  Agree with stopping salt tablets      Relevant Orders   Thyroid Panel With TSH   COMPLETE METABOLIC PANEL WITH GFR   Other Visit Diagnoses     Weight loss    -  Primary   Probably due to recent hospitalization.  Check thyroid panel.  X-rays and CTs done in the hospital and normal.  Recent visit with oncology   Relevant Orders  Thyroid Panel With TSH   Polypharmacy       Stop Omega 3, Ca/D, MVI, change Pepsid to as needed   Left-sided low back pain without sciatica, unspecified chronicity       Left buttock.  X-rays and CT  were normal.  Concern due to weight loss.  Check thyroid panel       Follow up plan: Return in about 2 weeks (around 03/17/2021). To monitor weight.

## 2021-03-03 NOTE — Assessment & Plan Note (Signed)
Check labs today.  Agree with stopping salt tablets

## 2021-03-04 LAB — COMPLETE METABOLIC PANEL WITHOUT GFR
AG Ratio: 1.4 (calc) (ref 1.0–2.5)
ALT: 12 U/L (ref 6–29)
AST: 15 U/L (ref 10–35)
Albumin: 3.9 g/dL (ref 3.6–5.1)
Alkaline phosphatase (APISO): 37 U/L (ref 37–153)
BUN: 15 mg/dL (ref 7–25)
CO2: 24 mmol/L (ref 20–32)
Calcium: 9.5 mg/dL (ref 8.6–10.4)
Chloride: 100 mmol/L (ref 98–110)
Creat: 0.86 mg/dL (ref 0.60–0.95)
Globulin: 2.7 g/dL (ref 1.9–3.7)
Glucose, Bld: 100 mg/dL — ABNORMAL HIGH (ref 65–99)
Potassium: 4.2 mmol/L (ref 3.5–5.3)
Sodium: 135 mmol/L (ref 135–146)
Total Bilirubin: 0.4 mg/dL (ref 0.2–1.2)
Total Protein: 6.6 g/dL (ref 6.1–8.1)
eGFR: 63 mL/min/1.73m2

## 2021-03-04 LAB — THYROID PANEL WITH TSH
Free Thyroxine Index: 3 (ref 1.4–3.8)
T3 Uptake: 34 % (ref 22–35)
T4, Total: 8.9 ug/dL (ref 5.1–11.9)
TSH: 2.05 m[IU]/L (ref 0.40–4.50)

## 2021-03-07 DIAGNOSIS — H401132 Primary open-angle glaucoma, bilateral, moderate stage: Secondary | ICD-10-CM | POA: Diagnosis not present

## 2021-03-07 DIAGNOSIS — H04129 Dry eye syndrome of unspecified lacrimal gland: Secondary | ICD-10-CM | POA: Diagnosis not present

## 2021-03-08 DIAGNOSIS — I10 Essential (primary) hypertension: Secondary | ICD-10-CM | POA: Diagnosis not present

## 2021-03-08 DIAGNOSIS — M25552 Pain in left hip: Secondary | ICD-10-CM | POA: Diagnosis not present

## 2021-03-08 DIAGNOSIS — E871 Hypo-osmolality and hyponatremia: Secondary | ICD-10-CM | POA: Diagnosis not present

## 2021-03-08 DIAGNOSIS — M549 Dorsalgia, unspecified: Secondary | ICD-10-CM | POA: Diagnosis not present

## 2021-03-08 DIAGNOSIS — I480 Paroxysmal atrial fibrillation: Secondary | ICD-10-CM | POA: Diagnosis not present

## 2021-03-08 DIAGNOSIS — C9190 Lymphoid leukemia, unspecified not having achieved remission: Secondary | ICD-10-CM | POA: Diagnosis not present

## 2021-03-10 DIAGNOSIS — I10 Essential (primary) hypertension: Secondary | ICD-10-CM | POA: Diagnosis not present

## 2021-03-10 DIAGNOSIS — I480 Paroxysmal atrial fibrillation: Secondary | ICD-10-CM | POA: Diagnosis not present

## 2021-03-10 DIAGNOSIS — C9190 Lymphoid leukemia, unspecified not having achieved remission: Secondary | ICD-10-CM | POA: Diagnosis not present

## 2021-03-10 DIAGNOSIS — M25552 Pain in left hip: Secondary | ICD-10-CM | POA: Diagnosis not present

## 2021-03-10 DIAGNOSIS — E871 Hypo-osmolality and hyponatremia: Secondary | ICD-10-CM | POA: Diagnosis not present

## 2021-03-10 DIAGNOSIS — M549 Dorsalgia, unspecified: Secondary | ICD-10-CM | POA: Diagnosis not present

## 2021-03-14 ENCOUNTER — Other Ambulatory Visit: Payer: Self-pay | Admitting: Family

## 2021-03-14 DIAGNOSIS — E871 Hypo-osmolality and hyponatremia: Secondary | ICD-10-CM | POA: Diagnosis not present

## 2021-03-14 DIAGNOSIS — I10 Essential (primary) hypertension: Secondary | ICD-10-CM | POA: Diagnosis not present

## 2021-03-14 DIAGNOSIS — C9190 Lymphoid leukemia, unspecified not having achieved remission: Secondary | ICD-10-CM | POA: Diagnosis not present

## 2021-03-14 DIAGNOSIS — M549 Dorsalgia, unspecified: Secondary | ICD-10-CM | POA: Diagnosis not present

## 2021-03-14 DIAGNOSIS — I480 Paroxysmal atrial fibrillation: Secondary | ICD-10-CM | POA: Diagnosis not present

## 2021-03-14 DIAGNOSIS — M25552 Pain in left hip: Secondary | ICD-10-CM | POA: Diagnosis not present

## 2021-03-15 ENCOUNTER — Telehealth: Payer: Self-pay

## 2021-03-15 NOTE — Telephone Encounter (Signed)
Copied from [place] [id number]. Topic: General - Other >> Mar 15, 2021 10:12 AM Yvette Rack wrote: Reason for CRM: Tommy with [facility] in [place] would like to report to [facility] that patient had missed PT visit on 02/25/21. Cb# [phone]

## 2021-03-16 DIAGNOSIS — M25552 Pain in left hip: Secondary | ICD-10-CM | POA: Diagnosis not present

## 2021-03-16 DIAGNOSIS — I480 Paroxysmal atrial fibrillation: Secondary | ICD-10-CM | POA: Diagnosis not present

## 2021-03-16 DIAGNOSIS — C9190 Lymphoid leukemia, unspecified not having achieved remission: Secondary | ICD-10-CM | POA: Diagnosis not present

## 2021-03-16 DIAGNOSIS — I10 Essential (primary) hypertension: Secondary | ICD-10-CM | POA: Diagnosis not present

## 2021-03-16 DIAGNOSIS — E871 Hypo-osmolality and hyponatremia: Secondary | ICD-10-CM | POA: Diagnosis not present

## 2021-03-16 DIAGNOSIS — M549 Dorsalgia, unspecified: Secondary | ICD-10-CM | POA: Diagnosis not present

## 2021-03-17 ENCOUNTER — Ambulatory Visit (INDEPENDENT_AMBULATORY_CARE_PROVIDER_SITE_OTHER): Payer: Medicare Other | Admitting: Unknown Physician Specialty

## 2021-03-17 ENCOUNTER — Encounter: Payer: Self-pay | Admitting: Unknown Physician Specialty

## 2021-03-17 ENCOUNTER — Other Ambulatory Visit: Payer: Self-pay

## 2021-03-17 VITALS — BP 154/70 | HR 81 | Temp 98.1°F | Resp 16 | Ht 62.0 in | Wt 132.3 lb

## 2021-03-17 DIAGNOSIS — I6523 Occlusion and stenosis of bilateral carotid arteries: Secondary | ICD-10-CM | POA: Diagnosis not present

## 2021-03-17 DIAGNOSIS — R634 Abnormal weight loss: Secondary | ICD-10-CM

## 2021-03-17 DIAGNOSIS — Z23 Encounter for immunization: Secondary | ICD-10-CM | POA: Diagnosis not present

## 2021-03-17 DIAGNOSIS — Z79899 Other long term (current) drug therapy: Secondary | ICD-10-CM | POA: Diagnosis not present

## 2021-03-17 NOTE — Progress Notes (Signed)
BP (!) 154/70   Pulse 81   Temp 98.1 F (36.7 C)   Resp 16   Ht _0  (1.575 m)   Wt 132 lb 4.8 oz (60 kg)   LMP  (LMP Unknown)   SpO2 99%   BMI 24.20 kg/m    Subjective:    Patient ID: Sydney Mcdaniel, female    DOB: 04-Oct-1924, 87 y.o.   MRN: [id number]  HPI: [name] is a 87 y.o. female  Chief Complaint  Patient presents with   Weight Loss   Weight loss:  Pt is here to f/u of her weight loss.  Her friend prepares meals for her.  She has gained 1 pound since last visit.  No nausea or vomiting.  We also stopped a number of supplemental pharmaceuticals and feels no different from previous  Relevant past medical, surgical, family and social history reviewed and updated as indicated. Interim medical history since our last visit reviewed. Allergies and medications reviewed and updated.  Review of Systems  Per HPI unless specifically indicated above     Objective:    BP (!) 154/70   Pulse 81   Temp 98.1 F (36.7 C)   Resp 16   Ht _1  (1.575 m)   Wt 132 lb 4.8 oz (60 kg)   LMP  (LMP Unknown)   SpO2 99%   BMI 24.20 kg/m   Wt Readings from Last 3 Encounters:  03/17/21 132 lb 4.8 oz (60 kg)  03/03/21 131 lb 1.6 oz (59.5 kg)  02/23/21 138 lb 8 oz (62.8 kg)    Physical Exam Constitutional:      General: She is not in acute distress.    Appearance: Normal appearance. She is well-developed.  HENT:     Head: Normocephalic and atraumatic.  Eyes:     General: Lids are normal. No scleral icterus.       Right eye: No discharge.        Left eye: No discharge.     Conjunctiva/sclera: Conjunctivae normal.  Neck:     Vascular: No carotid bruit or JVD.  Cardiovascular:     Rate and Rhythm: Normal rate and regular rhythm.     Heart sounds: Normal heart sounds.  Pulmonary:     Effort: Pulmonary effort is normal. No respiratory distress.     Breath sounds: Normal breath sounds.  Abdominal:     Palpations: There is no hepatomegaly or splenomegaly.   Musculoskeletal:        General: Normal range of motion.     Cervical back: Normal range of motion and neck supple.  Skin:    General: Skin is warm and dry.     Coloration: Skin is not pale.     Findings: No rash.  Neurological:     Mental Status: She is alert and oriented to person, place, and time.  Psychiatric:        Behavior: Behavior normal.        Thought Content: Thought content normal.        Judgment: Judgment normal.    Results for orders placed or performed in visit on 03/03/21  Thyroid Panel With TSH  Result Value Ref Range   T3 Uptake 34 22 - 35 %   T4, Total 8.9 5.1 - 11.9 mcg/dL   Free Thyroxine Index 3.0 1.4 - 3.8   TSH 2.05 0.40 - 4.50 mIU/L  COMPLETE METABOLIC PANEL WITH GFR  Result Value Ref Range  Glucose, Bld 100 (H) 65 - 99 mg/dL   BUN 15 7 - 25 mg/dL   Creat 0.86 0.60 - 0.95 mg/dL   eGFR 63 > OR = 60 mL/min/1.87m   BUN/Creatinine Ratio NOT APPLICABLE 6 - 22 (calc)   Sodium 135 135 - 146 mmol/L   Potassium 4.2 3.5 - 5.3 mmol/L   Chloride 100 98 - 110 mmol/L   CO2 24 20 - 32 mmol/L   Calcium 9.5 8.6 - 10.4 mg/dL   Total Protein 6.6 6.1 - 8.1 g/dL   Albumin 3.9 3.6 - 5.1 g/dL   Globulin 2.7 1.9 - 3.7 g/dL (calc)   AG Ratio 1.4 1.0 - 2.5 (calc)   Total Bilirubin 0.4 0.2 - 1.2 mg/dL   Alkaline phosphatase (APISO) 37 37 - 153 U/L   AST 15 10 - 35 U/L   ALT 12 6 - 29 U/L      Assessment & Plan:   Problem List Items Addressed This Visit   None Visit Diagnoses     Need for influenza vaccination    -  Primary   Relevant Orders   Flu Vaccine QUAD High Dose(Fluad) (Completed)   Weight loss       This has stabilized.  Will recheck in 1 month   Polypharmacy       Doing well off of supplements.  Discussed stopping Atorvastatin for one month as a trial to see if she feels less fatigued and less muscle pain.  Recheck in 1 m        Follow up plan: Return in about 4 weeks (around 04/14/2021).

## 2021-03-19 DIAGNOSIS — M549 Dorsalgia, unspecified: Secondary | ICD-10-CM | POA: Diagnosis not present

## 2021-03-19 DIAGNOSIS — D638 Anemia in other chronic diseases classified elsewhere: Secondary | ICD-10-CM | POA: Diagnosis not present

## 2021-03-19 DIAGNOSIS — L900 Lichen sclerosus et atrophicus: Secondary | ICD-10-CM | POA: Diagnosis not present

## 2021-03-19 DIAGNOSIS — H269 Unspecified cataract: Secondary | ICD-10-CM | POA: Diagnosis not present

## 2021-03-19 DIAGNOSIS — K219 Gastro-esophageal reflux disease without esophagitis: Secondary | ICD-10-CM | POA: Diagnosis not present

## 2021-03-19 DIAGNOSIS — I480 Paroxysmal atrial fibrillation: Secondary | ICD-10-CM | POA: Diagnosis not present

## 2021-03-19 DIAGNOSIS — R55 Syncope and collapse: Secondary | ICD-10-CM | POA: Diagnosis not present

## 2021-03-19 DIAGNOSIS — M810 Age-related osteoporosis without current pathological fracture: Secondary | ICD-10-CM | POA: Diagnosis not present

## 2021-03-19 DIAGNOSIS — H409 Unspecified glaucoma: Secondary | ICD-10-CM | POA: Diagnosis not present

## 2021-03-19 DIAGNOSIS — Z7901 Long term (current) use of anticoagulants: Secondary | ICD-10-CM | POA: Diagnosis not present

## 2021-03-19 DIAGNOSIS — R7301 Impaired fasting glucose: Secondary | ICD-10-CM | POA: Diagnosis not present

## 2021-03-19 DIAGNOSIS — G3184 Mild cognitive impairment, so stated: Secondary | ICD-10-CM | POA: Diagnosis not present

## 2021-03-19 DIAGNOSIS — E871 Hypo-osmolality and hyponatremia: Secondary | ICD-10-CM | POA: Diagnosis not present

## 2021-03-19 DIAGNOSIS — I10 Essential (primary) hypertension: Secondary | ICD-10-CM | POA: Diagnosis not present

## 2021-03-19 DIAGNOSIS — C9190 Lymphoid leukemia, unspecified not having achieved remission: Secondary | ICD-10-CM | POA: Diagnosis not present

## 2021-03-19 DIAGNOSIS — M25552 Pain in left hip: Secondary | ICD-10-CM | POA: Diagnosis not present

## 2021-03-19 DIAGNOSIS — E039 Hypothyroidism, unspecified: Secondary | ICD-10-CM | POA: Diagnosis not present

## 2021-03-19 DIAGNOSIS — E785 Hyperlipidemia, unspecified: Secondary | ICD-10-CM | POA: Diagnosis not present

## 2021-03-23 DIAGNOSIS — M549 Dorsalgia, unspecified: Secondary | ICD-10-CM | POA: Diagnosis not present

## 2021-03-23 DIAGNOSIS — M25552 Pain in left hip: Secondary | ICD-10-CM | POA: Diagnosis not present

## 2021-03-23 DIAGNOSIS — I480 Paroxysmal atrial fibrillation: Secondary | ICD-10-CM | POA: Diagnosis not present

## 2021-03-23 DIAGNOSIS — C9190 Lymphoid leukemia, unspecified not having achieved remission: Secondary | ICD-10-CM | POA: Diagnosis not present

## 2021-03-23 DIAGNOSIS — E871 Hypo-osmolality and hyponatremia: Secondary | ICD-10-CM | POA: Diagnosis not present

## 2021-03-23 DIAGNOSIS — I10 Essential (primary) hypertension: Secondary | ICD-10-CM | POA: Diagnosis not present

## 2021-03-28 ENCOUNTER — Other Ambulatory Visit: Payer: Self-pay

## 2021-03-28 ENCOUNTER — Emergency Department: Payer: Medicare Other

## 2021-03-28 ENCOUNTER — Encounter: Payer: Self-pay | Admitting: Emergency Medicine

## 2021-03-28 ENCOUNTER — Observation Stay
Admission: EM | Admit: 2021-03-28 | Discharge: 2021-03-29 | Disposition: A | Discharge status: Home / Self Care | Payer: Medicare Other | Attending: Family Medicine | Admitting: Family Medicine

## 2021-03-28 DIAGNOSIS — R42 Dizziness and giddiness: Secondary | ICD-10-CM

## 2021-03-28 DIAGNOSIS — E039 Hypothyroidism, unspecified: Secondary | ICD-10-CM | POA: Diagnosis present

## 2021-03-28 DIAGNOSIS — E871 Hypo-osmolality and hyponatremia: Secondary | ICD-10-CM | POA: Diagnosis not present

## 2021-03-28 DIAGNOSIS — I959 Hypotension, unspecified: Secondary | ICD-10-CM | POA: Diagnosis not present

## 2021-03-28 DIAGNOSIS — C9110 Chronic lymphocytic leukemia of B-cell type not having achieved remission: Secondary | ICD-10-CM | POA: Diagnosis present

## 2021-03-28 DIAGNOSIS — R064 Hyperventilation: Secondary | ICD-10-CM | POA: Diagnosis not present

## 2021-03-28 DIAGNOSIS — N390 Urinary tract infection, site not specified: Secondary | ICD-10-CM

## 2021-03-28 DIAGNOSIS — I10 Essential (primary) hypertension: Secondary | ICD-10-CM | POA: Diagnosis present

## 2021-03-28 DIAGNOSIS — G9341 Metabolic encephalopathy: Secondary | ICD-10-CM | POA: Diagnosis present

## 2021-03-28 DIAGNOSIS — I480 Paroxysmal atrial fibrillation: Secondary | ICD-10-CM | POA: Diagnosis not present

## 2021-03-28 DIAGNOSIS — Z79899 Other long term (current) drug therapy: Secondary | ICD-10-CM | POA: Diagnosis not present

## 2021-03-28 DIAGNOSIS — I5032 Chronic diastolic (congestive) heart failure: Secondary | ICD-10-CM | POA: Diagnosis present

## 2021-03-28 DIAGNOSIS — R4182 Altered mental status, unspecified: Secondary | ICD-10-CM | POA: Diagnosis not present

## 2021-03-28 DIAGNOSIS — Z7901 Long term (current) use of anticoagulants: Secondary | ICD-10-CM | POA: Insufficient documentation

## 2021-03-28 DIAGNOSIS — R531 Weakness: Secondary | ICD-10-CM

## 2021-03-28 DIAGNOSIS — Z20822 Contact with and (suspected) exposure to covid-19: Secondary | ICD-10-CM | POA: Diagnosis not present

## 2021-03-28 DIAGNOSIS — R000 Tachycardia, unspecified: Secondary | ICD-10-CM | POA: Diagnosis not present

## 2021-03-28 DIAGNOSIS — R001 Bradycardia, unspecified: Secondary | ICD-10-CM | POA: Diagnosis not present

## 2021-03-28 DIAGNOSIS — R8271 Bacteriuria: Secondary | ICD-10-CM | POA: Diagnosis present

## 2021-03-28 LAB — CBC WITH DIFFERENTIAL/PLATELET
Abs Immature Granulocytes: 0.02 10*3/uL (ref 0.00–0.07)
Basophils Absolute: 0 10*3/uL (ref 0.0–0.1)
Basophils Relative: 0 %
Eosinophils Absolute: 0.1 10*3/uL (ref 0.0–0.5)
Eosinophils Relative: 2 %
HCT: 29.8 % — ABNORMAL LOW (ref 36.0–46.0)
Hemoglobin: 10.3 g/dL — ABNORMAL LOW (ref 12.0–15.0)
Immature Granulocytes: 0 %
Lymphocytes Relative: 52 %
Lymphs Abs: 4.4 10*3/uL — ABNORMAL HIGH (ref 0.7–4.0)
MCH: 30.7 pg (ref 26.0–34.0)
MCHC: 34.6 g/dL (ref 30.0–36.0)
MCV: 89 fL (ref 80.0–100.0)
Monocytes Absolute: 0.7 10*3/uL (ref 0.1–1.0)
Monocytes Relative: 8 %
Neutro Abs: 3.2 10*3/uL (ref 1.7–7.7)
Neutrophils Relative %: 38 %
Platelets: 214 10*3/uL (ref 150–400)
RBC: 3.35 MIL/uL — ABNORMAL LOW (ref 3.87–5.11)
RDW: 15.2 % (ref 11.5–15.5)
Smear Review: NORMAL
WBC: 8.4 10*3/uL (ref 4.0–10.5)
nRBC: 0 % (ref 0.0–0.2)

## 2021-03-28 LAB — COMPREHENSIVE METABOLIC PANEL
ALT: 20 U/L (ref 0–44)
AST: 27 U/L (ref 15–41)
Albumin: 3.6 g/dL (ref 3.5–5.0)
Alkaline Phosphatase: 27 U/L — ABNORMAL LOW (ref 38–126)
Anion gap: 10 (ref 5–15)
BUN: 17 mg/dL (ref 8–23)
CO2: 22 mmol/L (ref 22–32)
Calcium: 8.8 mg/dL — ABNORMAL LOW (ref 8.9–10.3)
Chloride: 93 mmol/L — ABNORMAL LOW (ref 98–111)
Creatinine, Ser: 0.83 mg/dL (ref 0.44–1.00)
GFR, Estimated: >60 mL/min (ref >60)
Glucose, Bld: 122 mg/dL — ABNORMAL HIGH (ref 70–99)
Potassium: 3.7 mmol/L (ref 3.5–5.1)
Sodium: 125 mmol/L — ABNORMAL LOW (ref 135–145)
Total Bilirubin: 1 mg/dL (ref 0.3–1.2)
Total Protein: 6.8 g/dL (ref 6.5–8.1)

## 2021-03-28 LAB — URINALYSIS, COMPLETE (UACMP) WITH MICROSCOPIC
Bilirubin Urine: NEGATIVE
Glucose, UA: NEGATIVE mg/dL
Ketones, ur: NEGATIVE mg/dL
Nitrite: NEGATIVE
Protein, ur: NEGATIVE mg/dL
Specific Gravity, Urine: 1.004 — ABNORMAL LOW (ref 1.005–1.030)
WBC, UA: >50 WBC/hpf — ABNORMAL HIGH (ref 0–5)
pH: 7 (ref 5.0–8.0)

## 2021-03-28 LAB — SODIUM, URINE, RANDOM: Sodium, Ur: 32 mmol/L

## 2021-03-28 LAB — TROPONIN I (HIGH SENSITIVITY): Troponin I (High Sensitivity): 14 ng/L (ref <18)

## 2021-03-28 LAB — OSMOLALITY, URINE: Osmolality, Ur: 172 mOsm/kg — ABNORMAL LOW (ref 300–900)

## 2021-03-28 LAB — OSMOLALITY: Osmolality: 259 mOsm/kg — ABNORMAL LOW (ref 275–295)

## 2021-03-28 MED ORDER — LISINOPRIL 20 MG PO TABS: 20.0000 mg | ORAL_TABLET | Freq: Every day | ORAL | Status: DC

## 2021-03-28 MED ORDER — SODIUM CHLORIDE 0.9 % IV BOLUS
500.0000 mL | Freq: Once | INTRAVENOUS | Status: AC
  Administered 2021-03-28: 500 mL via INTRAVENOUS

## 2021-03-28 MED ORDER — ACETAMINOPHEN 650 MG RE SUPP: 650.0000 mg | Freq: Four times a day (QID) | RECTAL | Status: DC | PRN

## 2021-03-28 MED ORDER — ONDANSETRON HCL 4 MG PO TABS: 4.0000 mg | ORAL_TABLET | Freq: Four times a day (QID) | ORAL | Status: DC | PRN

## 2021-03-28 MED ORDER — ATORVASTATIN CALCIUM 20 MG PO TABS
40.0000 mg | ORAL_TABLET | Freq: Every day | ORAL | Status: DC
  Filled 2021-03-28: qty 2

## 2021-03-28 MED ORDER — RIVAROXABAN 15 MG PO TABS
15.0000 mg | ORAL_TABLET | Freq: Every day | ORAL | Status: DC
  Administered 2021-03-29: 15 mg via ORAL
  Filled 2021-03-28 (×2): qty 1

## 2021-03-28 MED ORDER — HYDROCODONE-ACETAMINOPHEN 5-325 MG PO TABS: 1.0000 | ORAL_TABLET | ORAL | Status: DC | PRN

## 2021-03-28 MED ORDER — ACETAMINOPHEN 325 MG PO TABS: 650.0000 mg | ORAL_TABLET | Freq: Four times a day (QID) | ORAL | Status: DC | PRN

## 2021-03-28 MED ORDER — SODIUM CHLORIDE 0.9 % IV SOLN
1.0000 g | Freq: Once | INTRAVENOUS | Status: AC
  Administered 2021-03-28: 1 g via INTRAVENOUS
  Filled 2021-03-28: qty 10

## 2021-03-28 MED ORDER — MIDAZOLAM HCL 2 MG/2ML IJ SOLN
1.0000 mg | Freq: Once | INTRAMUSCULAR | Status: AC
  Administered 2021-03-28: 1 mg via INTRAVENOUS
  Filled 2021-03-28: qty 2

## 2021-03-28 MED ORDER — ONDANSETRON HCL 4 MG/2ML IJ SOLN: 4.0000 mg | Freq: Four times a day (QID) | INTRAMUSCULAR | Status: DC | PRN

## 2021-03-28 MED ORDER — LATANOPROST 0.005 % OP SOLN
1.0000 [drp] | Freq: Every day | OPHTHALMIC | Status: DC
  Filled 2021-03-28: qty 2.5

## 2021-03-28 MED ORDER — TIMOLOL MALEATE 0.5 % OP SOLN
1.0000 [drp] | Freq: Two times a day (BID) | OPHTHALMIC | Status: DC
  Administered 2021-03-29: 1 [drp] via OPHTHALMIC
  Filled 2021-03-28: qty 5

## 2021-03-28 NOTE — ED Notes (Addendum)
Pt daughter at bedside to the nurses station. Pt states that she needs to have a BM. Pt placed on bedpan. While attempting to place pt on bedpan pt urinated in the bed. Full linen change at this time including sheet, brief, pad, and gown. Pt repositioned and purewick placed back on the pt.   Pt did have a small BM.

## 2021-03-28 NOTE — H&P (Signed)
History and Physical    Sydney Mcdaniel:[id number] DOB: [birth year] DOA: 03/28/2021  PCP: Mila Doce   Patient coming from: home  I have personally briefly reviewed patient's old medical records in [place]  Chief Complaint: dizziness  HPI: Sydney Mcdaniel is a 87 y.o. female with medical history significant for Paroxysmal A. 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  Symptoms were of sudden onset and was notable for sensation of dizziness and feeling like she was going to fall.  She denies chest pain, shortness of breath, nausea vomiting or diarrhea.  She has no cough fever or chills.  Patient was taken off her salt tablets by her cardiologist due to abnormal outpatient sodium level at her follow-up appointment.  According to her daughter who gives a lot of the history, after her recent past hospitalization and has been drinks a large amount of fluid daily including 32 ounces of Gatorade, lots of water, as well as orange juice, coffee, easily up to a gallon of fluids daily.  Her sodium on 9/8 was normal at 135  ED course: On arrival afebrile, BP 178/66, pulse 83, O2 sat 100% on room air Blood work: Sodium 125, down from 135 on 03/03/21.  Troponin 14 otherwise unremarkable Urinalysis somewhat consistent with UTI with large leukocyte esterase and greater than 50 WBCs  EKG, personally viewed and interpreted: Sinus at 81 with nonspecific ST-T wave changes  CT head with no acute disease  Patient given an IV fluid bolus, midazolam for anxiety started on Rocephin.  Hospitalist consulted for admission.  Review of  Systems: As per HPI otherwise all other systems on review of systems negative.    Past Medical History:  Diagnosis Date   Allergy    Anemia    Cataract    CLL (chronic lymphocytic leukemia) (HCC)    GERD (gastroesophageal reflux disease)    Glaucoma    H/O: hysterectomy    Total   Hyperlipidemia    Hypertension    Hypothyroidism    Impaired fasting glucose    Lichen sclerosus    Osteoporosis    Hips   PAF (paroxysmal atrial fibrillation) ([facility])    a. 06/15/2016 Event monitor: 4% afib burden; b. CHA2DS2VASc - 4-->Xarelto.   Syncope    a. 03/2016 Echo: EF55-60%, no rwma, mild AI/MR, nl PASP; b. 03/2016 48h Holter: no significant arrhythmias/pauses; c. 03/2016 MV: mild apical defect, likely breast attenuation, nl EF, low risk; d. 05/2016 Event monitor: No significant arrhythmia; e. 05/2016 Event monitor: PAF (4%).    Past Surgical History:  Procedure Laterality Date   APPENDECTOMY     EYE SURGERY     Glaucoma   MULTIPLE TOOTH EXTRACTIONS N/A    2 teeth   TOTAL ABDOMINAL HYSTERECTOMY       reports that she has never smoked. She has never used smokeless tobacco. She reports that she does not drink alcohol and does not use drugs.  Allergies  Allergen Reactions   Other    Alphagan [Brimonidine] Itching and Rash   Pravachol [Pravastatin Sodium] Itching and Rash   Pravastatin Itching, Other (See Comments) and Rash    Family History  Problem Relation Age of Onset  Heart attack Mother    Glaucoma Mother    Heart attack Father    Parkinson's disease Brother    Heart attack Brother       Prior to Admission medications   Medication Sig Start Date End Date Taking? Authorizing Provider  alendronate (FOSAMAX) 70 MG tablet TAKE ONE TABLET BY MOUTH EVERY 7 DAYS. TAKE WITH A FULL GLASS OF WATER ON AN EMPTY STOMACH. 06/08/20   Delsa Grana, PA-C  atorvastatin (LIPITOR) 40 MG tablet Take 1 tablet (40 mg total) by mouth daily. 11/24/20   End, Harrell Gave, MD  cholecalciferol  (VITAMIN D) 1000 units tablet Take 1,000 Units by mouth daily. Patient not taking: Reported on 03/17/2021    [provider]  ferrous sulfate 325 (65 FE) MG tablet Take 325 mg by mouth 2 (two) times daily with a meal.    [provider]  latanoprost (XALATAN) 0.005 % ophthalmic solution Place 1 drop into the right eye at bedtime.  04/24/15   [provider]  levothyroxine (EUTHYROX) 75 MCG tablet TAKE ONE TABLET BY MOUTH ON MONDAYS, [place], FRIDAYS, AND SUNDAYS 04/08/20   Delsa Grana, PA-C  levothyroxine (EUTHYROX) 88 MCG tablet TAKE 1 TABLET BY MOUTH ON TUESDAYS, THURSDAYS, AND SATURDAYS 04/08/20   Delsa Grana, PA-C  lisinopril (ZESTRIL) 20 MG tablet Take 1 tablet by mouth once daily 03/15/21   End, Harrell Gave, MD  polyethylene glycol (MIRALAX / GLYCOLAX) 17 g packet Take 17 g by mouth daily.    [provider]  Rivaroxaban (XARELTO) 15 MG TABS tablet Take 1 tablet (15 mg total) by mouth daily. 06/28/20   Loel Dubonnet, NP  timolol (TIMOPTIC) 0.5 % ophthalmic solution Place 1 drop into the right eye 2 (two) times daily.  01/01/20   [provider]  vitamin B-12 (CYANOCOBALAMIN) 100 MCG tablet Take 100 mcg by mouth daily.    [provider]    Physical Exam: Vitals:   03/28/21 1915 03/28/21 1916 03/28/21 1920 03/28/21 2106  BP: (!) 178/66 (!) 178/66  (!) 161/83  Pulse: 79 83  80  Resp: (!) 26 18  20   Temp:  97.7 F (36.5 C)    TempSrc:  Oral    SpO2: 100% 100%  100%  Weight:   61.2 kg   Height:   5\' 2"  (1.575 m)      Vitals:   03/28/21 1915 03/28/21 1916 03/28/21 1920 03/28/21 2106  BP: (!) 178/66 (!) 178/66  (!) 161/83  Pulse: 79 83  80  Resp: (!) 26 18  20   Temp:  97.7 F (36.5 C)    TempSrc:  Oral    SpO2: 100% 100%  100%  Weight:   61.2 kg   Height:   5\' 2"  (1.575 m)    Constitutional: Alert and oriented x 2. Not in any apparent distress HEENT:      Head: Normocephalic and atraumatic.         Eyes: PERLA, EOMI,  Conjunctivae are normal. Sclera is non-icteric.       Mouth/Throat: Mucous membranes are moist.       Neck: Supple with no signs of meningismus. Cardiovascular: Regular rate and rhythm. No murmurs, gallops, or rubs. 2+ symmetrical distal pulses are present . No JVD. No LE edema Respiratory: Respiratory effort normal .Lungs sounds clear bilaterally. No wheezes, crackles, or rhonchi.  Gastrointestinal: Soft, non tender, and non distended with positive bowel sounds.  Genitourinary: No CVA tenderness. Musculoskeletal: Nontender with normal range of motion in  all extremities. No cyanosis, or erythema of extremities. Neurologic:  Face is symmetric. Moving all extremities. No gross focal neurologic deficits . Skin: Skin is warm, dry.  No rash or ulcers Psychiatric: Mood and affect are normal    Labs on Admission: I have personally reviewed following labs and imaging studies  CBC: Recent Labs  Lab 03/28/21 1916  WBC 8.4  NEUTROABS 3.2  HGB 10.3*  HCT 29.8*  MCV 89.0  PLT 505   Basic Metabolic Panel: Recent Labs  Lab 03/28/21 1916  NA 125*  K 3.7  CL 93*  CO2 22  GLUCOSE 122*  BUN 17  CREATININE 0.83  CALCIUM 8.8*   GFR: Estimated Creatinine Clearance: 36.4 mL/min (by C-G formula based on SCr of 0.83 mg/dL). Liver Function Tests: Recent Labs  Lab 03/28/21 1916  AST 27  ALT 20  ALKPHOS 27*  BILITOT 1.0  PROT 6.8  ALBUMIN 3.6   No results for input(s): LIPASE, AMYLASE in the last 168 hours. No results for input(s): AMMONIA in the last 168 hours. Coagulation Profile: No results for input(s): INR, PROTIME in the last 168 hours. Cardiac Enzymes: No results for input(s): CKTOTAL, CKMB, CKMBINDEX, TROPONINI in the last 168 hours. BNP (last 3 results) No results for input(s): PROBNP in the last 8760 hours. HbA1C: No results for input(s): HGBA1C in the last 72 hours. CBG: No results for input(s): GLUCAP in the last 168 hours. Lipid Profile: No results for input(s):  CHOL, HDL, LDLCALC, TRIG, CHOLHDL, LDLDIRECT in the last 72 hours. Thyroid Function Tests: No results for input(s): TSH, T4TOTAL, FREET4, T3FREE, THYROIDAB in the last 72 hours. Anemia Panel: No results for input(s): VITAMINB12, FOLATE, FERRITIN, TIBC, IRON, RETICCTPCT in the last 72 hours. Urine analysis:    Component Value Date/Time   COLORURINE YELLOW (A) 03/28/2021 2016   APPEARANCEUR CLOUDY (A) 03/28/2021 2016   LABSPEC 1.004 (L) 03/28/2021 2016   PHURINE 7.0 03/28/2021 2016   GLUCOSEU NEGATIVE 03/28/2021 2016   HGBUR SMALL (A) 03/28/2021 2016   BILIRUBINUR NEGATIVE 03/28/2021 2016   BILIRUBINUR neg 02/25/2016 [place] 03/28/2021 2016   PROTEINUR NEGATIVE 03/28/2021 2016   UROBILINOGEN 0.2 02/25/2016 1013   NITRITE NEGATIVE 03/28/2021 2016   LEUKOCYTESUR LARGE (A) 03/28/2021 2016    Radiological Exams on Admission: CT Head Wo Contrast  Result Date: 03/28/2021 CLINICAL DATA:  Mental status change, unknown cause EXAM: CT HEAD WITHOUT CONTRAST TECHNIQUE: Contiguous axial images were obtained from the base of the skull through the vertex without intravenous contrast. COMPARISON:  02/11/2021 FINDINGS: Brain: No evidence of acute infarction, hemorrhage, hydrocephalus, extra-axial collection or mass lesion/mass effect. Moderate low-density changes within the periventricular and subcortical white matter compatible with chronic microvascular ischemic change. Mild diffuse cerebral volume loss. Vascular: Atherosclerotic calcifications involving the large vessels of the skull base. No unexpected hyperdense vessel. Skull: Normal. Negative for fracture or focal lesion. Sinuses/Orbits: Small probable retention cysts in the left maxillary sinus. Otherwise clear. Other: None. IMPRESSION: 1. No acute intracranial findings. 2. Chronic microvascular ischemic change and cerebral volume loss. Electronically Signed   By: Davina Poke D.O.   On: 03/28/2021 20:16      Assessment/Plan 87 year old female with history of paroxysmal A. 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      Hyponatremia, acute/subacute on chronic   Dizziness and weakness -Sodium 125, down from 135 on 03/03/21. - Suspecting hypervolemic given patient has been drinking a lot of Gatorade - Was previously recommended salt tablets during hospitalization in August for same - Dizziness and weakness - Received a normal saline bolus in the ED - Continue to monitor sodium - Follow urine and sodium osmolality and urine sodium - We will hold off on further hydration for now - Fall precautions  Dizziness and weakness - Believe related to symptomatic hyponatremia - Urinalysis abnormal but will await culture for UTI diagnosis - CT head with no acute finding - Neurologic checks and fall precautions - Consider PT eval  Possible UTI (urinary tract infection) - Abnormal urinalysis - Received a dose of Rocephin in the ED - Follow urine culture to determine whether continued antibiotics necessary    Chronic diastolic congestive heart failure ([facility]) - Not acutely exacerbated - Not currently on diuretics - Continue lisinopril     Paroxysmal atrial fibrillation ([facility]) - Continue Xarelto - Not currently on rate control agent, pending med rec    Essential hypertension - Continue lisinopril    Hypothyroidism - Continue levothyroxine    CLL (chronic lymphocytic leukemia) ([facility]) - Patient continues with oncology    DVT prophylaxis: Xarelto  Code Status: full code  Family Communication:  Daughter, [name] Disposition Plan: Back to  previous home environment Consults called: none  Status:observation    Athena Masse MD [facility]     03/28/2021, 9:23 PM

## 2021-03-28 NOTE — ED Notes (Signed)
Pt is extremely and continuously calling out, "help me! Help me" Pt grasping side rails keeps stating she is going to fall out of bed, reassured pt that she could relax. Pt still extremely anxious. Dr. Cheri Fowler EDP made aware.

## 2021-03-28 NOTE — ED Notes (Signed)
Pt assisted off bedpan, pt had large BM. Pt purewick changed and meal tray given.

## 2021-03-28 NOTE — ED Notes (Signed)
Patient transported to CT 

## 2021-03-28 NOTE — ED Notes (Signed)
Pt presents to the ED from home. Per EMS, pt has been having anxiety and generalized weakness since 5:00pm this today. Denies falls. Pt is anxious upon arrival continuously stating that she feels like she is going to fall out of the bed. Even after reassurance pt still extremely anxious. Pt denies pain. Pt is able to answer questions appropriately. Per daughter, pt is normally mostly independent. Pt has a hx of hyponatremia and per daughter this is how she acted when she was hyponatremic in the past. Pt is A&Ox4 and NAD but does have some periods of hyperventilation when she feels like she is about to fall.

## 2021-03-28 NOTE — ED Triage Notes (Signed)
Pt to ED via [facility] from home c/o weakness and anxiety that started at 5 pm. Pt was increasingly anxious during assesment afraid she is going to fall out of bed. Pt has a hx of hyponatremia and family states she has had the same symptoms displaying today.  Pt is A&Ox4,NAD Pt has hx of Afib, diabetes

## 2021-03-28 NOTE — ED Provider Notes (Signed)
[facility] Emergency Department Provider Note   ____________________________________________   Event Date/Time   First MD Initiated Contact with Patient 03/28/21 1914     (approximate)  I have reviewed the triage vital signs and the nursing notes.   HISTORY  Chief Complaint Weakness and Anxiety    HPI Sydney Mcdaniel is a 87 y.o. female who presents for generalized weakness and dizziness  LOCATION: Generalized DURATION: Just prior to arrival TIMING: Stable since onset SEVERITY: Severe QUALITY: Generalized weakness CONTEXT: Patient states that approximately 2 hours prior to arrival she began feeling generally weak as well as unsteady on her feet and had a sensation that she was going to fall over.  Patient has been complaining of feeling that she was in a fall onto the stretcher for the entire transport by EMS. MODIFYING FACTORS: Denies any exacerbating or relieving factors ASSOCIATED SYMPTOMS: Dizziness   Per medical record review patient has past medical history of CLL and recurrent hyponatremia with similar symptoms          Past Medical History:  Diagnosis Date   Allergy    Anemia    Cataract    CLL (chronic lymphocytic leukemia) (HCC)    GERD (gastroesophageal reflux disease)    Glaucoma    H/O: hysterectomy    Total   Hyperlipidemia    Hypertension    Hypothyroidism    Impaired fasting glucose    Lichen sclerosus    Osteoporosis    Hips   PAF (paroxysmal atrial fibrillation) ([place])    a. 06/15/2016 Event monitor: 4% afib burden; b. CHA2DS2VASc - 4-->Xarelto.   Syncope    a. 03/2016 Echo: EF55-60%, no rwma, mild AI/MR, nl PASP; b. 03/2016 48h Holter: no significant arrhythmias/pauses; c. 03/2016 MV: mild apical defect, likely breast attenuation, nl EF, low risk; d. 05/2016 Event monitor: No significant arrhythmia; e. 05/2016 Event monitor: PAF (4%).    Patient Active Problem List   Diagnosis Date Noted   Fall 02/11/2021    Normocytic anemia 05/31/2020   Acute colitis 04/11/2020   GI bleeding 04/11/2020   Iron deficiency anemia 03/08/2020   Goals of care, counseling/discussion 03/08/2020   Positive direct antiglobulin test (DAT) 12/04/2019   Sinus bradycardia 04/09/2019   First degree AV block 04/09/2019   Postural dizziness with presyncope 02/25/2019   Osteoporosis 08/21/2018   Osteoarthritis of left hip 05/21/2018   Vasovagal syncope 04/19/2018   Bilateral carotid artery stenosis 04/19/2018   History of syncope 10/17/2017   MCI (mild cognitive impairment) 08/12/2017   Lung nodules 07/09/2017   Paroxysmal atrial fibrillation ([place]) 04/18/2017   Pleural effusion 10/20/2016   Hyponatremia 10/20/2016   Chronic diastolic congestive heart failure ([place]) 06/28/2016   Primary open-angle glaucoma, bilateral, mild stage 05/08/2016   Labile blood glucose 04/27/2016   Bladder prolapse, female, acquired 02/25/2016   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   B12 deficiency 01/11/2016   Anemia 01/10/2016   CLL (chronic lymphocytic leukemia) ([place]) 01/10/2016   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 12/21/2015   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 12/21/2015   Bradycardia 12/21/2015   Lymphocytosis 06/25/2015   Impaired fasting glucose 06/22/2015   Carotid atherosclerosis 06/22/2015   Essential hypertension 12/22/2014   Hyperlipidemia 12/22/2014   Hypothyroidism 12/22/2014    Past Surgical History:  Procedure Laterality Date   APPENDECTOMY     EYE SURGERY     Glaucoma   MULTIPLE TOOTH EXTRACTIONS N/A    2 teeth  TOTAL ABDOMINAL HYSTERECTOMY      Prior to Admission medications   Medication Sig Start Date End Date Taking? Authorizing Provider  alendronate (FOSAMAX) 70 MG tablet TAKE ONE TABLET BY MOUTH EVERY 7 DAYS. TAKE WITH A FULL GLASS OF WATER ON AN EMPTY STOMACH. 06/08/20   Delsa Grana, PA-C  atorvastatin (LIPITOR) 40 MG tablet Take 1 tablet (40 mg total) by mouth daily. 11/24/20   End,  Harrell Gave, MD  cholecalciferol (VITAMIN D) 1000 units tablet Take 1,000 Units by mouth daily. Patient not taking: Reported on 03/17/2021    [provider]  ferrous sulfate 325 (65 FE) MG tablet Take 325 mg by mouth 2 (two) times daily with a meal.    [provider]  latanoprost (XALATAN) 0.005 % ophthalmic solution Place 1 drop into the right eye at bedtime.  04/24/15   [provider]  levothyroxine (EUTHYROX) 75 MCG tablet TAKE ONE TABLET BY MOUTH ON MONDAYS, [place], FRIDAYS, AND SUNDAYS 04/08/20   Delsa Grana, PA-C  levothyroxine (EUTHYROX) 88 MCG tablet TAKE 1 TABLET BY MOUTH ON TUESDAYS, THURSDAYS, AND SATURDAYS 04/08/20   Delsa Grana, PA-C  lisinopril (ZESTRIL) 20 MG tablet Take 1 tablet by mouth once daily 03/15/21   End, Harrell Gave, MD  polyethylene glycol (MIRALAX / GLYCOLAX) 17 g packet Take 17 g by mouth daily.    [provider]  Rivaroxaban (XARELTO) 15 MG TABS tablet Take 1 tablet (15 mg total) by mouth daily. 06/28/20   Loel Dubonnet, NP  timolol (TIMOPTIC) 0.5 % ophthalmic solution Place 1 drop into the right eye 2 (two) times daily.  01/01/20   [provider]  vitamin B-12 (CYANOCOBALAMIN) 100 MCG tablet Take 100 mcg by mouth daily.    [provider]    Allergies Other, Alphagan [brimonidine], Pravachol [pravastatin sodium], and Pravastatin  Family History  Problem Relation Age of Onset   Heart attack Mother    Glaucoma Mother    Heart attack Father    Parkinson's disease Brother    Heart attack Brother     Social History Social History   Tobacco Use   Smoking status: Never   Smokeless tobacco: Never  Vaping Use   Vaping Use: Never used  Substance Use Topics   Alcohol use: No   Drug use: No    Review of Systems Constitutional: No fever/chills Eyes: No visual changes. ENT: No sore throat. Cardiovascular: Denies chest pain. Respiratory: Denies shortness of breath. Gastrointestinal: No  abdominal pain.  No nausea, no vomiting.  No diarrhea. Genitourinary: Negative for dysuria. Musculoskeletal: Negative for acute arthralgias Skin: Negative for rash. Neurological: Positive for generalized weakness and dizziness.  Negative for headaches, numbness/paresthesias in any extremity Psychiatric: Negative for suicidal ideation/homicidal ideation   ____________________________________________   PHYSICAL EXAM:  VITAL SIGNS: ED Triage Vitals  Enc Vitals Group     BP 03/28/21 1915 (!) 178/66     Pulse Rate 03/28/21 1915 79     Resp 03/28/21 1915 (!) 26     Temp 03/28/21 1916 97.7 F (36.5 C)     Temp Source 03/28/21 1916 Oral     SpO2 03/28/21 1915 100 %     Weight 03/28/21 1920 135 lb (61.2 kg)     Height 03/28/21 1920 5\' 2"  (1.575 m)     Head Circumference --      Peak Flow --      Pain Score 03/28/21 1920 0     Pain Loc --  Pain Edu? --      Excl. in [place]? --    Constitutional: Alert and oriented. Well appearing and in no acute distress. Eyes: Conjunctivae are normal. PERRL. Head: Atraumatic. Nose: No congestion/rhinnorhea. Mouth/Throat: Mucous membranes are moist. Neck: No stridor Cardiovascular: Grossly normal heart sounds.  Good peripheral circulation. Respiratory: Normal respiratory effort.  No retractions. Gastrointestinal: Soft and nontender. No distention. Musculoskeletal: No obvious deformities Neurologic:  Normal speech and language. No gross focal neurologic deficits are appreciated. NIHSS 0 Skin:  Skin is warm and dry. No rash noted. Psychiatric: Mood and affect are normal. Speech and behavior are normal.  ____________________________________________   LABS (all labs ordered are listed, but only abnormal results are displayed)  Labs Reviewed  COMPREHENSIVE METABOLIC PANEL - Abnormal; Notable for the following components:      Result Value   Sodium 125 (*)    Chloride 93 (*)    Glucose, Bld 122 (*)    Calcium 8.8 (*)    Alkaline Phosphatase  27 (*)    All other components within normal limits  CBC WITH DIFFERENTIAL/PLATELET - Abnormal; Notable for the following components:   RBC 3.35 (*)    Hemoglobin 10.3 (*)    HCT 29.8 (*)    Lymphs Abs 4.4 (*)    All other components within normal limits  URINALYSIS, COMPLETE (UACMP) WITH MICROSCOPIC - Abnormal; Notable for the following components:   Color, Urine YELLOW (*)    APPearance CLOUDY (*)    Specific Gravity, Urine 1.004 (*)    Hgb urine dipstick SMALL (*)    Leukocytes,Ua LARGE (*)    WBC, UA >50 (*)    Bacteria, UA RARE (*)    All other components within normal limits  URINE CULTURE  TROPONIN I (HIGH SENSITIVITY)   ____________________________________________  EKG  ED ECG REPORT I, Naaman Plummer, the attending physician, personally viewed and interpreted this ECG.  Date: 03/28/2021 EKG Time: 1913 Rate: 81 Rhythm: normal sinus rhythm QRS Axis: normal Intervals: normal ST/T Wave abnormalities: normal Narrative Interpretation: no evidence of acute ischemia  ____________________________________________  RADIOLOGY  ED MD interpretation: CT of the head without contrast shows no evidence of acute abnormalities including no intracerebral hemorrhage, obvious masses, or significant edema  Official radiology report(s): CT Head Wo Contrast  Result Date: 03/28/2021 CLINICAL DATA:  Mental status change, unknown cause EXAM: CT HEAD WITHOUT CONTRAST TECHNIQUE: Contiguous axial images were obtained from the base of the skull through the vertex without intravenous contrast. COMPARISON:  02/11/2021 FINDINGS: Brain: No evidence of acute infarction, hemorrhage, hydrocephalus, extra-axial collection or mass lesion/mass effect. Moderate low-density changes within the periventricular and subcortical white matter compatible with chronic microvascular ischemic change. Mild diffuse cerebral volume loss. Vascular: Atherosclerotic calcifications involving the large vessels of the skull  base. No unexpected hyperdense vessel. Skull: Normal. Negative for fracture or focal lesion. Sinuses/Orbits: Small probable retention cysts in the left maxillary sinus. Otherwise clear. Other: None. IMPRESSION: 1. No acute intracranial findings. 2. Chronic microvascular ischemic change and cerebral volume loss. Electronically Signed   By: Davina Poke D.O.   On: 03/28/2021 20:16    ____________________________________________   PROCEDURES  Procedure(s) performed (including Critical Care):  .1-3 Lead EKG Interpretation Performed by: Naaman Plummer, MD Authorized by: Naaman Plummer, MD     Interpretation: normal     ECG rate:  77   ECG rate assessment: normal     Rhythm: sinus rhythm     Ectopy: none  Conduction: normal    CRITICAL CARE Performed by: Naaman Plummer  Total critical care time: 33 minutes  Critical care time was exclusive of separately billable procedures and treating other patients.  Critical care was necessary to treat or prevent imminent or life-threatening deterioration.  Critical care was time spent personally by me on the following activities: development of treatment plan with patient and/or surrogate as well as nursing, discussions with consultants, evaluation of patient's response to treatment, examination of patient, obtaining history from patient or surrogate, ordering and performing treatments and interventions, ordering and review of laboratory studies, ordering and review of radiographic studies, pulse oximetry and re-evaluation of patient's condition.  ____________________________________________   INITIAL IMPRESSION / ASSESSMENT AND PLAN / ED COURSE  As part of my medical decision making, I reviewed the following data within the electronic medical record, if available:  Nursing notes reviewed and incorporated, Labs reviewed, EKG interpreted, Old chart reviewed, Radiograph reviewed and Notes from prior ED visits reviewed and incorporated         Patient found to be hyponatremic without an overt and safely reversible cause and therefore requires admission for further monitoring and management.  As patient not significantly clinically volume down, altered or with emergent sequelae defer fluid resuscitation at this time and increasing Sodium to admitting team and inpatient consultants.  Dispo: Admit to medicine      ____________________________________________   FINAL CLINICAL IMPRESSION(S) / ED DIAGNOSES  Final diagnoses:  Acute hyponatremia  Generalized weakness  Dizziness     ED Discharge Orders     None        Note:  This document was prepared using Dragon voice recognition software and may include unintentional dictation errors.    Naaman Plummer, MD 03/29/21 2147

## 2021-03-29 ENCOUNTER — Encounter: Payer: Self-pay | Admitting: Internal Medicine

## 2021-03-29 DIAGNOSIS — E871 Hypo-osmolality and hyponatremia: Secondary | ICD-10-CM | POA: Diagnosis not present

## 2021-03-29 DIAGNOSIS — G9341 Metabolic encephalopathy: Secondary | ICD-10-CM

## 2021-03-29 LAB — RESP PANEL BY RT-PCR (FLU A&B, COVID) ARPGX2
Influenza A by PCR: NEGATIVE
Influenza B by PCR: NEGATIVE
SARS Coronavirus 2 by RT PCR: NEGATIVE

## 2021-03-29 LAB — BASIC METABOLIC PANEL
Anion gap: 5 (ref 5–15)
BUN: 13 mg/dL (ref 8–23)
CO2: 25 mmol/L (ref 22–32)
Calcium: 8.7 mg/dL — ABNORMAL LOW (ref 8.9–10.3)
Chloride: 104 mmol/L (ref 98–111)
Creatinine, Ser: 0.7 mg/dL (ref 0.44–1.00)
GFR, Estimated: >60 mL/min (ref >60)
Glucose, Bld: 116 mg/dL — ABNORMAL HIGH (ref 70–99)
Potassium: 4.1 mmol/L (ref 3.5–5.1)
Sodium: 134 mmol/L — ABNORMAL LOW (ref 135–145)

## 2021-03-29 MED ORDER — SENNA 8.6 MG PO TABS
1.0000 | ORAL_TABLET | Freq: Every day | ORAL | Status: DC
  Filled 2021-03-29: qty 1

## 2021-03-29 MED ORDER — LEVOTHYROXINE SODIUM 88 MCG PO TABS
88.0000 ug | ORAL_TABLET | ORAL | Status: DC
  Administered 2021-03-29: 88 ug via ORAL
  Filled 2021-03-29: qty 1

## 2021-03-29 MED ORDER — LEVOTHYROXINE SODIUM 75 MCG PO TABS: 75.0000 ug | ORAL_TABLET | ORAL | Status: DC

## 2021-03-29 MED ORDER — POLYETHYLENE GLYCOL 3350 17 G PO PACK
17.0000 g | PACK | Freq: Every day | ORAL | Status: DC
  Administered 2021-03-29: 17 g via ORAL
  Filled 2021-03-29: qty 1

## 2021-03-29 NOTE — Hospital Course (Signed)
Sydney Mcdaniel is a 87 y.o. F with pAF on Xarelto, hypothyroidism, CLL, HTN, and chronic hyponatremia who presented with acute weakness and imbalance and confusion.  Patient lives alone, daughter checks on her frequently throughout the day.  Daughter came to her house the night of admission, the patient was standing at the kitchen counter, appeared unstable and was anxious and afraid of falling.  Seemed disoriented.  In the ER, blood pressure elevated, pulse normal, sodium 125, troponin normal, UA with leukocytes.  CT head unremarkable.

## 2021-03-29 NOTE — Discharge Summary (Signed)
Physician Discharge Summary   Patient name: Sydney Mcdaniel  Admit date:     03/28/2021  Discharge date: 03/29/2021  Attending Physician: Athena Masse [[id number]]  Discharge Physician: Edwin Dada   [facility]    Recommendations for discharge: Follow up with PCP Kathrine Haddock for BMP in 1 week to check Na Kathrine Haddock: Please check orthostatic vital signs at follow up appointment        [place]. Schedule an appointment as soon as possible for a visit in 1 week(s).   Specialty: Family Medicine Contact information: [place] [phone]         Kathrine Haddock, NP. Schedule an appointment as soon as possible for a visit in 1 week(s).   Specialties: Nurse Practitioner, Family Medicine Contact information: [address] [place] [address] [phone]                 Primary discharge diagnosis Acute on chronic hyponatremia    Discharge Diagnoses   Acute metabolic encephalopathy due to hyponatremia   Hyponatremia   Asymptomatic bacteriuria   Essential hypertension   Hypothyroidism   Chronic diastolic congestive heart failure (HCC)   Paroxysmal atrial fibrillation (HCC)   CLL (chronic lymphocytic leukemia) [facility])     Hospital Course   Sydney Mcdaniel is a 87 y.o. F with pAF on Xarelto, hypothyroidism, CLL, HTN, and chronic hyponatremia who presented with acute weakness and imbalance and confusion.  Patient lives alone, daughter checks on her frequently throughout the day.  Daughter came to her house the night of admission, the patient was standing at the kitchen counter, appeared unstable and was anxious and afraid of falling.  Seemed disoriented.  In the ER, blood pressure elevated, pulse normal, sodium 125, troponin normal, UA with leukocytes.  CT head unremarkable.         Patient admitted and given IV fluids, Rocephin for  possible UTI.  In the morning, Na had returned to baseline.  Patient felt at baseline, had no symptoms.    Given no urinary symptoms at all, WBC or fever, antibiotics stopped.  Patient had mild orthostatic blood pressures, but was stable with ambulation and had no dizziness or lightheadedness with standing.  Follow up with PCP.        Condition at discharge: good  Exam Physical Exam Vitals reviewed.  Constitutional:      General: She is not in acute distress.    Appearance: Normal appearance. She is not toxic-appearing.  HENT:     Head: Normocephalic and atraumatic.     Mouth/Throat:     Mouth: Mucous membranes are moist.  Cardiovascular:     Rate and Rhythm: Tachycardia present.  Pulmonary:     Effort: Pulmonary effort is normal.     Breath sounds: Normal breath sounds. No wheezing or rales.  Abdominal:     General: Abdomen is flat.     Palpations: Abdomen is soft.     Tenderness: There is no abdominal tenderness. There is no guarding.  Skin:    General: Skin is warm.     Findings: No rash.  Neurological:     General: No focal deficit present.     Mental Status: She is alert and oriented to person, place, and time.  Psychiatric:        Mood and Affect: Mood normal.        Behavior: Behavior normal.  Thought Content: Thought content normal.      Disposition: Home  Discharge time: less than 30 minutes. Allergies as of 03/29/2021       Reactions   Other    Alphagan [brimonidine] Itching, Rash   Pravachol [pravastatin Sodium] Itching, Rash   Pravastatin Itching, Other (See Comments), Rash        Medication List     STOP taking these medications    atorvastatin 40 MG tablet Commonly known as: LIPITOR   cholecalciferol 1000 units tablet Commonly known as: VITAMIN D       TAKE these medications    alendronate 70 MG tablet Commonly known as: FOSAMAX TAKE ONE TABLET BY MOUTH EVERY 7 DAYS. TAKE WITH A FULL GLASS OF WATER ON AN EMPTY STOMACH.    amLODipine 5 MG tablet Commonly known as: NORVASC Take 5 mg by mouth daily. Take 2.5 mg to 5 mg as needed   ferrous sulfate 325 (65 FE) MG tablet Take 325 mg by mouth 2 (two) times daily with a meal.   latanoprost 0.005 % ophthalmic solution Commonly known as: XALATAN Place 1 drop into the right eye at bedtime.   levothyroxine 75 MCG tablet Commonly known as: Euthyrox TAKE ONE TABLET BY MOUTH ON MONDAYS, WEDNESDAYS, FRIDAYS, AND SUNDAYS   levothyroxine 88 MCG tablet Commonly known as: Euthyrox TAKE 1 TABLET BY MOUTH ON TUESDAYS, THURSDAYS, AND SATURDAYS   lisinopril 20 MG tablet Commonly known as: ZESTRIL Take 1 tablet by mouth once daily   polyethylene glycol 17 g packet Commonly known as: MIRALAX / GLYCOLAX Take 17 g by mouth daily.   Rivaroxaban 15 MG Tabs tablet Commonly known as: XARELTO Take 1 tablet (15 mg total) by mouth daily.   senna 8.6 MG tablet Commonly known as: SENOKOT Take 1 tablet by mouth daily.   timolol 0.5 % ophthalmic solution Commonly known as: TIMOPTIC Place 1 drop into the right eye 2 (two) times daily.   vitamin B-12 100 MCG tablet Commonly known as: CYANOCOBALAMIN Take 100 mcg by mouth daily.        CT Head Wo Contrast  Result Date: 03/28/2021 CLINICAL DATA:  Mental status change, unknown cause EXAM: CT HEAD WITHOUT CONTRAST TECHNIQUE: Contiguous axial images were obtained from the base of the skull through the vertex without intravenous contrast. COMPARISON:  02/11/2021 FINDINGS: Brain: No evidence of acute infarction, hemorrhage, hydrocephalus, extra-axial collection or mass lesion/mass effect. Moderate low-density changes within the periventricular and subcortical white matter compatible with chronic microvascular ischemic change. Mild diffuse cerebral volume loss. Vascular: Atherosclerotic calcifications involving the large vessels of the skull base. No unexpected hyperdense vessel. Skull: Normal. Negative for fracture or focal lesion.  Sinuses/Orbits: Small probable retention cysts in the left maxillary sinus. Otherwise clear. Other: None. IMPRESSION: 1. No acute intracranial findings. 2. Chronic microvascular ischemic change and cerebral volume loss. Electronically Signed   By: Davina Poke D.O.   On: 03/28/2021 20:16   Results for orders placed or performed during the hospital encounter of 03/28/21  Urine Culture     Status: None (Preliminary result)   Collection Time: 03/28/21  8:16 PM   Specimen: Urine, Clean Catch  Result Value Ref Range Status   Specimen Description   Final    URINE, CLEAN CATCH Performed at [facility], [address], [place], [place] [address]    Special Requests   Final    NONE Performed at [facility], [address], [place], [place] [address]  Culture   Final    CULTURE REINCUBATED FOR BETTER GROWTH Performed at [facility], [place] [address], [place], [place] [address]    Report Status PENDING  Incomplete  Resp Panel by RT-PCR (Flu A&B, Covid) Nasopharyngeal Swab     Status: None   Collection Time: 03/28/21 11:31 PM   Specimen: Nasopharyngeal Swab; Nasopharyngeal(NP) swabs in vial transport medium  Result Value Ref Range Status   SARS Coronavirus 2 by RT PCR NEGATIVE NEGATIVE Final    Comment: (NOTE) SARS-CoV-2 target nucleic acids are NOT DETECTED.  The SARS-CoV-2 RNA is generally detectable in upper respiratory specimens during the acute phase of infection. The lowest concentration of SARS-CoV-2 viral copies this assay can detect is 138 copies/mL. A negative result does not preclude SARS-Cov-2 infection and should not be used as the sole basis for treatment or other patient management decisions. A negative result may occur with  improper specimen collection/handling, submission of specimen other than nasopharyngeal swab, presence of viral mutation(s) within the areas targeted by this assay, and inadequate number of viral copies(<138 copies/mL). A  negative result must be combined with clinical observations, patient history, and epidemiological information. The expected result is Negative.  Fact Sheet for Patients:  EntrepreneurPulse.com.au  Fact Sheet for Healthcare Providers:  IncredibleEmployment.be  This test is no t yet approved or cleared by the [place] FDA and  has been authorized for detection and/or diagnosis of SARS-CoV-2 by FDA under an Emergency Use Authorization (EUA). This EUA will remain  in effect (meaning this test can be used) for the duration of the COVID-19 declaration under Section 564(b)(1) of the Act, 21 U.S.C.section 360bbb-3(b)(1), unless the authorization is terminated  or revoked sooner.       Influenza A by PCR NEGATIVE NEGATIVE Final   Influenza B by PCR NEGATIVE NEGATIVE Final    Comment: (NOTE) The Xpert Xpress SARS-CoV-2/FLU/RSV plus assay is intended as an aid in the diagnosis of influenza from Nasopharyngeal swab specimens and should not be used as a sole basis for treatment. Nasal washings and aspirates are unacceptable for Xpert Xpress SARS-CoV-2/FLU/RSV testing.  Fact Sheet for Patients: EntrepreneurPulse.com.au  Fact Sheet for Healthcare Providers: IncredibleEmployment.be  This test is not yet approved or cleared by the [place] FDA and has been authorized for detection and/or diagnosis of SARS-CoV-2 by FDA under an Emergency Use Authorization (EUA). This EUA will remain in effect (meaning this test can be used) for the duration of the COVID-19 declaration under Section 564(b)(1) of the Act, 21 U.S.C. section 360bbb-3(b)(1), unless the authorization is terminated or revoked.  Performed at [facility], [address] [place], [place] [address]     Signed:  Edwin Dada MD.  [facility] 03/29/2021, 8:14 PM

## 2021-03-29 NOTE — Plan of Care (Signed)

## 2021-03-30 ENCOUNTER — Inpatient Hospital Stay: Payer: PRIVATE HEALTH INSURANCE | Admitting: Family Medicine

## 2021-03-31 DIAGNOSIS — C9190 Lymphoid leukemia, unspecified not having achieved remission: Secondary | ICD-10-CM | POA: Diagnosis not present

## 2021-03-31 DIAGNOSIS — E871 Hypo-osmolality and hyponatremia: Secondary | ICD-10-CM | POA: Diagnosis not present

## 2021-03-31 DIAGNOSIS — I480 Paroxysmal atrial fibrillation: Secondary | ICD-10-CM | POA: Diagnosis not present

## 2021-03-31 DIAGNOSIS — I10 Essential (primary) hypertension: Secondary | ICD-10-CM | POA: Diagnosis not present

## 2021-03-31 DIAGNOSIS — M549 Dorsalgia, unspecified: Secondary | ICD-10-CM | POA: Diagnosis not present

## 2021-03-31 DIAGNOSIS — M25552 Pain in left hip: Secondary | ICD-10-CM | POA: Diagnosis not present

## 2021-03-31 LAB — URINE CULTURE: Culture: >=100000 — AB

## 2021-04-04 ENCOUNTER — Other Ambulatory Visit: Payer: Self-pay

## 2021-04-04 ENCOUNTER — Ambulatory Visit (INDEPENDENT_AMBULATORY_CARE_PROVIDER_SITE_OTHER): Payer: Medicare Other | Admitting: Family Medicine

## 2021-04-04 ENCOUNTER — Encounter: Payer: Self-pay | Admitting: Family Medicine

## 2021-04-04 VITALS — BP 142/68 | HR 81 | Temp 98.0°F | Resp 18 | Ht 62.0 in | Wt 140.6 lb

## 2021-04-04 DIAGNOSIS — E871 Hypo-osmolality and hyponatremia: Secondary | ICD-10-CM | POA: Diagnosis not present

## 2021-04-04 DIAGNOSIS — I10 Essential (primary) hypertension: Secondary | ICD-10-CM | POA: Diagnosis not present

## 2021-04-04 NOTE — Assessment & Plan Note (Signed)
At goal for age with wide pulse pressure. Continue current management with goal of avoiding vasovagal episodes.

## 2021-04-04 NOTE — Progress Notes (Signed)
   SUBJECTIVE:   CHIEF COMPLAINT / HPI:   HOSPITAL FOLLOW UP Hospital/facility: [facility] 10/3-10/4 Diagnosis: acute metabolic encephalopathy 2/2 hyponatremia, resolved with IVF. Procedures/tests:  - CT head NAICA - UA with leuks, s/p rocephin - mildly positive orthostatics - Urine culture >100k Ecoli. Consultants: none New medications: none Discharge instructions: f/u BMP  Status: better - follows with Onc for CLL, last visit 8/31 - prior Ct chest 01/2021 with long-term stable and decreasing pulmonary nodules, no further w/u required. - follows with Cardiology, last visit 02/16/21 with no further intervention planned for recurrent vasovagal syncope. F/u next month. - denies recurrent episodes since discharge. - eating well - lives next to daughter, reports she is eating and drinking well. Drinks at least 2 28oz bottles of water, some gatorade, cranberry juice. Urinates few times per day. - only taking amlodipine prn.  OBJECTIVE:   BP (!) 142/68   Pulse 81   Temp 98 F (36.7 C) (Oral)   Resp 18   Ht 5\' 2"  (1.575 m)   Wt 140 lb 9.6 oz (63.8 kg)   LMP  (LMP Unknown)   SpO2 98%   BMI 25.72 kg/m   Gen: well appearing, in NAD Card: RRR Lungs: CTAB Ext: WWP, no edema   ASSESSMENT/PLAN:   Essential hypertension At goal for age with wide pulse pressure. Continue current management with goal of avoiding vasovagal episodes.  Hyponatremia Improved with IVF, likely 2/2 hypovolemia. Will recheck labs and urine studies today. Continue to encourage oral hydration.      Myles Gip, DO

## 2021-04-04 NOTE — Patient Instructions (Signed)
It was great to see you!  Our plans for today:  - Make sure to stay hydrated.  - We are checking some labs today, we will release these results to your MyChart.  Take care and seek immediate care sooner if you develop any concerns.   Dr. Ky Barban

## 2021-04-04 NOTE — Assessment & Plan Note (Signed)
Improved with IVF, likely 2/2 hypovolemia. Will recheck labs and urine studies today. Continue to encourage oral hydration.

## 2021-04-05 ENCOUNTER — Ambulatory Visit: Payer: Self-pay | Admitting: *Deleted

## 2021-04-05 ENCOUNTER — Telehealth: Payer: Self-pay | Admitting: Family Medicine

## 2021-04-05 DIAGNOSIS — E871 Hypo-osmolality and hyponatremia: Secondary | ICD-10-CM | POA: Diagnosis not present

## 2021-04-05 DIAGNOSIS — I480 Paroxysmal atrial fibrillation: Secondary | ICD-10-CM | POA: Diagnosis not present

## 2021-04-05 DIAGNOSIS — M549 Dorsalgia, unspecified: Secondary | ICD-10-CM | POA: Diagnosis not present

## 2021-04-05 DIAGNOSIS — I10 Essential (primary) hypertension: Secondary | ICD-10-CM | POA: Diagnosis not present

## 2021-04-05 DIAGNOSIS — M25552 Pain in left hip: Secondary | ICD-10-CM | POA: Diagnosis not present

## 2021-04-05 DIAGNOSIS — C9190 Lymphoid leukemia, unspecified not having achieved remission: Secondary | ICD-10-CM | POA: Diagnosis not present

## 2021-04-05 NOTE — Telephone Encounter (Signed)
Called patient back and daughter , on [place], Sydney Mcdaniel, rechecked patient's B/P for 218/64 using patient's automatic B/P monitor. Daughter reports patient took 5 mg amlodipine at 12:15 pm not 1/2 tab or 2/5 mg . Patient denies symptoms , no headache, weakness , N/T on either side of body and daughter reports she will recheck B/P in an hour and call clinic back to report B/P. Care advise given. Patient 's daughter verbalized understanding of care advise and to call back or take patient to ED or call 911 if symptoms worsen.

## 2021-04-05 NOTE — Telephone Encounter (Signed)
BP was 182 sitting and when he checked again it was 196/76 /  she is not able to participate in therapy today due to BP / please advise

## 2021-04-05 NOTE — Telephone Encounter (Signed)
Pt was advise by triage to take BP again and daughter, [name] has taken BP again after medication and states it is down to 182/58 which is about her normal. Pt states mother is having no symptoms and is feeling fine. Daughter states that if any further issues occur they will reach out. Daughterstates only  FU if needed is [phone] but states this occurrence is normal for pt once in a while.

## 2021-04-05 NOTE — Telephone Encounter (Signed)
Called by Konrad Dolores, PTA with [facility] [phone] to report elevated B/P reading and unable to provide therapy today. Therapist reports patient B/P prior to call 196/76. Denies symptoms other than blurred vision and patient is reporting she wears glasses and has blurred vision . Patient unable to participate in therapy today. Therapist reports patient has taken 1/2 tablet of prescribed amlodipine 5 mg at 12:15 pm. Patient reports orders to take 2/5 mg amlodipine if B/P elevated at lunch. B/P rechecked manually by therapist at 12:23 pm for 206/84. Patient at rest at this time and is going to drink a glass of water and family will recheck B/P in 30 minutes. Instructed therapist to notify patient NT will call back to evaluate B/P reading and if any symptoms noted for further instructions. Patient and therapist verbalized understanding .

## 2021-04-05 NOTE — Telephone Encounter (Signed)
Reason for Disposition  [0] Systolic BP  >= 298 OR Diastolic >= 473 AND [0] having NO cardiac or neurologic symptoms  Answer Assessment - Initial Assessment Questions 1. BLOOD PRESSURE: "What is the blood pressure?" "Did you take at least two measurements 5 minutes apart?"     B/P 196/76 .prior to call and now  12:23 pm B/P 206/84 2. ONSET: "When did you take your blood pressure?"     Before therapy and now  3. HOW: "How did you obtain the blood pressure?" (e.g., visiting nurse, automatic home BP monitor)     Manual B/p  4. HISTORY: "Do you have a history of high blood pressure?"     Yes  5. MEDICATIONS: "Are you taking any medications for blood pressure?" "Have you missed any doses recently?"     Taking lisinopril and amlodipine 5 mg split in 1/2 depending on B/P 6. OTHER SYMPTOMS: "Do you have any symptoms?" (e.g., headache, chest pain, blurred vision, difficulty breathing, weakness)     Blurred vision but reports wearing glasses and has blurred vision at times  7. PREGNANCY: "Is there any chance you are pregnant?" "When was your last menstrual period?"     na  Protocols used: Blood Pressure - High-A-AH

## 2021-04-06 LAB — BASIC METABOLIC PANEL
BUN: 13 mg/dL (ref 7–25)
CO2: 25 mmol/L (ref 20–32)
Calcium: 9.2 mg/dL (ref 8.6–10.4)
Chloride: 103 mmol/L (ref 98–110)
Creat: 0.71 mg/dL (ref 0.60–0.95)
Glucose, Bld: 118 mg/dL — ABNORMAL HIGH (ref 65–99)
Potassium: 4.1 mmol/L (ref 3.5–5.3)
Sodium: 136 mmol/L (ref 135–146)

## 2021-04-06 LAB — SODIUM, URINE, RANDOM: Sodium, Ur: 45 mmol/L (ref 28–272)

## 2021-04-06 LAB — OSMOLALITY, URINE: Osmolality, Ur: 210 mOsm/kg (ref 50–1200)

## 2021-04-07 NOTE — Telephone Encounter (Signed)
Daughter notified 

## 2021-04-14 ENCOUNTER — Ambulatory Visit: Payer: PRIVATE HEALTH INSURANCE | Admitting: Unknown Physician Specialty

## 2021-04-15 DIAGNOSIS — I10 Essential (primary) hypertension: Secondary | ICD-10-CM | POA: Diagnosis not present

## 2021-04-15 DIAGNOSIS — M549 Dorsalgia, unspecified: Secondary | ICD-10-CM | POA: Diagnosis not present

## 2021-04-15 DIAGNOSIS — C9190 Lymphoid leukemia, unspecified not having achieved remission: Secondary | ICD-10-CM | POA: Diagnosis not present

## 2021-04-15 DIAGNOSIS — M25552 Pain in left hip: Secondary | ICD-10-CM | POA: Diagnosis not present

## 2021-04-15 DIAGNOSIS — E871 Hypo-osmolality and hyponatremia: Secondary | ICD-10-CM | POA: Diagnosis not present

## 2021-04-15 DIAGNOSIS — I480 Paroxysmal atrial fibrillation: Secondary | ICD-10-CM | POA: Diagnosis not present

## 2021-04-18 ENCOUNTER — Other Ambulatory Visit: Payer: Self-pay | Admitting: Internal Medicine

## 2021-04-18 DIAGNOSIS — I10 Essential (primary) hypertension: Secondary | ICD-10-CM

## 2021-05-01 DIAGNOSIS — Z20828 Contact with and (suspected) exposure to other viral communicable diseases: Secondary | ICD-10-CM | POA: Diagnosis not present

## 2021-05-05 ENCOUNTER — Ambulatory Visit (INDEPENDENT_AMBULATORY_CARE_PROVIDER_SITE_OTHER): Payer: Medicare Other | Admitting: Unknown Physician Specialty

## 2021-05-05 ENCOUNTER — Encounter: Payer: Self-pay | Admitting: Unknown Physician Specialty

## 2021-05-05 ENCOUNTER — Other Ambulatory Visit: Payer: Self-pay

## 2021-05-05 DIAGNOSIS — I10 Essential (primary) hypertension: Secondary | ICD-10-CM | POA: Diagnosis not present

## 2021-05-05 DIAGNOSIS — E782 Mixed hyperlipidemia: Secondary | ICD-10-CM | POA: Diagnosis not present

## 2021-05-05 DIAGNOSIS — E871 Hypo-osmolality and hyponatremia: Secondary | ICD-10-CM | POA: Diagnosis not present

## 2021-05-05 DIAGNOSIS — I6523 Occlusion and stenosis of bilateral carotid arteries: Secondary | ICD-10-CM

## 2021-05-05 NOTE — Assessment & Plan Note (Signed)
No evidence of benefit at age.  I recommend continuing off Lipitor but will defer to cardiology

## 2021-05-05 NOTE — Assessment & Plan Note (Signed)
This is a chroinic intermittent problem and has caused several hospitalizations.  Will refer to Nephroogy for further evaluation

## 2021-05-05 NOTE — Progress Notes (Signed)
BP (!) 144/66   Pulse 63   Temp 97.6 F (36.4 C) (Oral)   Resp 16   Ht 5\' 2"  (1.575 m)   Wt 137 lb (62.1 kg)   LMP  (LMP Unknown)   SpO2 99%   BMI 25.06 kg/m    Subjective:    Patient ID: MARINELL IGARASHI, female    DOB: 06-23-1925, 87 y.o.   MRN: [id number]  HPI: ELANDRA POWELL is a 87 y.o. female  Chief Complaint  Patient presents with   Follow-up   Hyperlipidemia   Hypertension   Pt has had another "low sodium attack" and was in the hospital overnight.  F/u by Dr. Ky Barban and likely due to hypovolemia.  Pt has not been to nephrology   Hypertension Taking daily Amlodipine due to elevated BP.  Also taking Lisinopril daily.  Noting that sometimes the Amlodipine "overshoots" and causes hypotension  Hyperlipidemia Stopped statin for a month to see if it helps with weakness.  Pt notices no difference off or on the statin  Relevant past medical, surgical, family and social history reviewed and updated as indicated. Interim medical history since our last visit reviewed. Allergies and medications reviewed and updated.  Review of Systems  Constitutional: Negative.   Respiratory: Negative.     Per HPI unless specifically indicated above     Objective:    BP (!) 144/66   Pulse 63   Temp 97.6 F (36.4 C) (Oral)   Resp 16   Ht 5\' 2"  (1.575 m)   Wt 137 lb (62.1 kg)   LMP  (LMP Unknown)   SpO2 99%   BMI 25.06 kg/m   Wt Readings from Last 3 Encounters:  05/05/21 137 lb (62.1 kg)  04/04/21 140 lb 9.6 oz (63.8 kg)  03/29/21 138 lb 7.2 oz (62.8 kg)    Physical Exam Constitutional:      General: She is not in acute distress.    Appearance: Normal appearance. She is well-developed.  HENT:     Head: Normocephalic and atraumatic.  Eyes:     General: Lids are normal. No scleral icterus.       Right eye: No discharge.        Left eye: No discharge.     Conjunctiva/sclera: Conjunctivae normal.  Neck:     Vascular: No carotid bruit or JVD.  Cardiovascular:      Rate and Rhythm: Normal rate and regular rhythm.     Heart sounds: Normal heart sounds.  Pulmonary:     Effort: Pulmonary effort is normal. No respiratory distress.     Breath sounds: Normal breath sounds.  Abdominal:     Palpations: There is no hepatomegaly or splenomegaly.  Musculoskeletal:        General: Normal range of motion.     Cervical back: Normal range of motion and neck supple.  Skin:    General: Skin is warm and dry.     Coloration: Skin is not pale.     Findings: No rash.  Neurological:     Mental Status: She is alert and oriented to person, place, and time.  Psychiatric:        Behavior: Behavior normal.        Thought Content: Thought content normal.        Judgment: Judgment normal.    Results for orders placed or performed in visit on 12/45/80  Basic Metabolic Panel (BMET)  Result Value Ref Range   Glucose, Bld 118 (H)  65 - 99 mg/dL   BUN 13 7 - 25 mg/dL   Creat 0.71 0.60 - 0.95 mg/dL   BUN/Creatinine Ratio NOT APPLICABLE 6 - 22 (calc)   Sodium 136 135 - 146 mmol/L   Potassium 4.1 3.5 - 5.3 mmol/L   Chloride 103 98 - 110 mmol/L   CO2 25 20 - 32 mmol/L   Calcium 9.2 8.6 - 10.4 mg/dL  Sodium, urine, random  Result Value Ref Range   Sodium, Ur 45 28 - 272 mmol/L  Osmolality, urine  Result Value Ref Range   Osmolality, Ur 210 50 - 1,200 mOsm/kg      Assessment & Plan:   Problem List Items Addressed This Visit       Unprioritized   Essential hypertension    Pt taking ACE which ? Is causing hyponatremia. Also Amlodipine making BP labile.  Will refer to nephrology for further advice on BP management      Hyperlipidemia    No evidence of benefit at age.  I recommend continuing off Lipitor but will defer to cardiology      Hyponatremia    This is a chroinic intermittent problem and has caused several hospitalizations.  Will refer to Nephroogy for further evaluation        Follow up plan: Return in about 2 months (around 07/05/2021).

## 2021-05-05 NOTE — Assessment & Plan Note (Signed)
Pt taking ACE which ? Is causing hyponatremia. Also Amlodipine making BP labile.  Will refer to nephrology for further advice on BP management

## 2021-05-06 LAB — COMPLETE METABOLIC PANEL WITH GFR
AG Ratio: 1.4 (calc) (ref 1.0–2.5)
ALT: 21 U/L (ref 6–29)
AST: 18 U/L (ref 10–35)
Albumin: 3.9 g/dL (ref 3.6–5.1)
Alkaline phosphatase (APISO): 30 U/L — ABNORMAL LOW (ref 37–153)
BUN: 18 mg/dL (ref 7–25)
CO2: 27 mmol/L (ref 20–32)
Calcium: 9.4 mg/dL (ref 8.6–10.4)
Chloride: 99 mmol/L (ref 98–110)
Creat: 0.79 mg/dL (ref 0.60–0.95)
Globulin: 2.8 g/dL (calc) (ref 1.9–3.7)
Glucose, Bld: 100 mg/dL — ABNORMAL HIGH (ref 65–99)
Potassium: 4.7 mmol/L (ref 3.5–5.3)
Sodium: 133 mmol/L — ABNORMAL LOW (ref 135–146)
Total Bilirubin: 0.3 mg/dL (ref 0.2–1.2)
Total Protein: 6.7 g/dL (ref 6.1–8.1)
eGFR: 70 mL/min/{1.73_m2} (ref >=60)

## 2021-05-08 ENCOUNTER — Other Ambulatory Visit: Payer: Self-pay | Admitting: Family Medicine

## 2021-05-08 DIAGNOSIS — M810 Age-related osteoporosis without current pathological fracture: Secondary | ICD-10-CM

## 2021-05-09 NOTE — Telephone Encounter (Signed)
Requested medication (s) are due for refill today:   Yes  Requested medication (s) are on the active medication list:   Yes  Future visit scheduled:   Yes in 1 mo. With Kathrine Haddock   Last ordered: 06/08/2020 #12, 3 refills  Returned because failed protocol.   Level due to be checked.      Requested Prescriptions  Pending Prescriptions Disp Refills   alendronate (FOSAMAX) 70 MG tablet [Pharmacy Med Name: Alendronate Sodium 70 MG Oral Tablet] 12 tablet 0    Sig: TAKE 1 TABLET BY MOUTH ONCE A WEEK WITH A FULL GLASS OF WATER ON AN EMPTY STOMACH     Endocrinology:  Bisphosphonates Failed - 05/08/2021  2:52 PM      Failed - Vitamin D in normal range and within 360 days    Vit D, 25-Hydroxy  Date Value Ref Range Status  09/09/2018 35 30 - 100 ng/mL Final    Comment:    Vitamin D Status         25-OH Vitamin D: . Deficiency:                    <20 ng/mL Insufficiency:             20 - 29 ng/mL Optimal:                 > or = 30 ng/mL . For 25-OH Vitamin D testing on patients on  D2-supplementation and patients for whom quantitation  of D2 and D3 fractions is required, the QuestAssureD(TM) 25-OH VIT D, (D2,D3), LC/MS/MS is recommended: order  code [phone] (patients >19yrs). . For more information on this test, go to: http://education.questdiagnostics.com/faq/FAQ163 (This link is being provided for  informational/educational purposes only.)           Passed - Ca in normal range and within 360 days    Calcium  Date Value Ref Range Status  05/05/2021 9.4 8.6 - 10.4 mg/dL Final          Passed - Valid encounter within last 12 months    Recent Outpatient Visits           4 days ago Hyponatremia   [place], NP   1 month ago Hyponatremia   [place], DO   1 month ago Need for influenza vaccination   [facility] Kathrine Haddock, NP   2 months ago Weight loss   [place], NP   4 months ago Rotator cuff arthropathy of right shoulder   [place], NP       Future Appointments             In 1 week Sindy Guadeloupe, MD [facility]   In 2 weeks End, Harrell Gave, MD [facility], [place]   In [place], [place], NP [facility], [place]   In 2 months  [place]

## 2021-05-12 ENCOUNTER — Inpatient Hospital Stay: Payer: Medicare Other | Attending: Oncology

## 2021-05-12 ENCOUNTER — Other Ambulatory Visit: Payer: Self-pay

## 2021-05-12 DIAGNOSIS — D649 Anemia, unspecified: Secondary | ICD-10-CM | POA: Diagnosis not present

## 2021-05-12 DIAGNOSIS — E538 Deficiency of other specified B group vitamins: Secondary | ICD-10-CM | POA: Insufficient documentation

## 2021-05-12 DIAGNOSIS — C9110 Chronic lymphocytic leukemia of B-cell type not having achieved remission: Secondary | ICD-10-CM | POA: Insufficient documentation

## 2021-05-12 DIAGNOSIS — E039 Hypothyroidism, unspecified: Secondary | ICD-10-CM | POA: Diagnosis not present

## 2021-05-12 LAB — COMPREHENSIVE METABOLIC PANEL
ALT: 19 U/L (ref 0–44)
AST: 24 U/L (ref 15–41)
Albumin: 4.1 g/dL (ref 3.5–5.0)
Alkaline Phosphatase: 32 U/L — ABNORMAL LOW (ref 38–126)
Anion gap: 9 (ref 5–15)
BUN: 15 mg/dL (ref 8–23)
CO2: 22 mmol/L (ref 22–32)
Calcium: 9.2 mg/dL (ref 8.9–10.3)
Chloride: 97 mmol/L — ABNORMAL LOW (ref 98–111)
Creatinine, Ser: 0.9 mg/dL (ref 0.44–1.00)
GFR, Estimated: 60 mL/min — ABNORMAL LOW (ref >60)
Glucose, Bld: 95 mg/dL (ref 70–99)
Potassium: 3.8 mmol/L (ref 3.5–5.1)
Sodium: 128 mmol/L — ABNORMAL LOW (ref 135–145)
Total Bilirubin: 0.3 mg/dL (ref 0.3–1.2)
Total Protein: 7.8 g/dL (ref 6.5–8.1)

## 2021-05-12 LAB — CBC WITH DIFFERENTIAL/PLATELET
Abs Immature Granulocytes: 0.02 10*3/uL (ref 0.00–0.07)
Basophils Absolute: 0 10*3/uL (ref 0.0–0.1)
Basophils Relative: 0 %
Eosinophils Absolute: 0.1 10*3/uL (ref 0.0–0.5)
Eosinophils Relative: 1 %
HCT: 35.9 % — ABNORMAL LOW (ref 36.0–46.0)
Hemoglobin: 11.7 g/dL — ABNORMAL LOW (ref 12.0–15.0)
Immature Granulocytes: 0 %
Lymphocytes Relative: 54 %
Lymphs Abs: 4.5 10*3/uL — ABNORMAL HIGH (ref 0.7–4.0)
MCH: 29.7 pg (ref 26.0–34.0)
MCHC: 32.6 g/dL (ref 30.0–36.0)
MCV: 91.1 fL (ref 80.0–100.0)
Monocytes Absolute: 0.5 10*3/uL (ref 0.1–1.0)
Monocytes Relative: 6 %
Neutro Abs: 3.3 10*3/uL (ref 1.7–7.7)
Neutrophils Relative %: 39 %
Platelets: 224 10*3/uL (ref 150–400)
RBC: 3.94 MIL/uL (ref 3.87–5.11)
RDW: 15 % (ref 11.5–15.5)
WBC: 8.5 10*3/uL (ref 4.0–10.5)
nRBC: 0 % (ref 0.0–0.2)

## 2021-05-12 LAB — RETICULOCYTES
Immature Retic Fract: 7.5 % (ref 2.3–15.9)
RBC.: 3.95 MIL/uL (ref 3.87–5.11)
Retic Count, Absolute: 45.4 10*3/uL (ref 19.0–186.0)
Retic Ct Pct: 1.2 % (ref 0.4–3.1)

## 2021-05-12 LAB — DAT, POLYSPECIFIC AHG (ARMC ONLY): Polyspecific AHG test: NEGATIVE

## 2021-05-12 LAB — LACTATE DEHYDROGENASE: LDH: 152 U/L (ref 98–192)

## 2021-05-13 ENCOUNTER — Other Ambulatory Visit: Payer: Medicare Other

## 2021-05-13 LAB — HAPTOGLOBIN: Haptoglobin: 137 mg/dL (ref 41–333)

## 2021-05-18 ENCOUNTER — Encounter: Payer: Self-pay | Admitting: Oncology

## 2021-05-18 ENCOUNTER — Inpatient Hospital Stay (HOSPITAL_BASED_OUTPATIENT_CLINIC_OR_DEPARTMENT_OTHER): Payer: Medicare Other | Admitting: Oncology

## 2021-05-18 ENCOUNTER — Telehealth: Payer: Medicare Other | Admitting: Oncology

## 2021-05-18 ENCOUNTER — Other Ambulatory Visit: Payer: Self-pay

## 2021-05-18 DIAGNOSIS — E538 Deficiency of other specified B group vitamins: Secondary | ICD-10-CM

## 2021-05-18 DIAGNOSIS — C9110 Chronic lymphocytic leukemia of B-cell type not having achieved remission: Secondary | ICD-10-CM | POA: Diagnosis not present

## 2021-05-18 DIAGNOSIS — E871 Hypo-osmolality and hyponatremia: Secondary | ICD-10-CM | POA: Diagnosis not present

## 2021-05-18 DIAGNOSIS — I10 Essential (primary) hypertension: Secondary | ICD-10-CM | POA: Diagnosis not present

## 2021-05-18 DIAGNOSIS — C9190 Lymphoid leukemia, unspecified not having achieved remission: Secondary | ICD-10-CM | POA: Diagnosis not present

## 2021-05-18 DIAGNOSIS — D649 Anemia, unspecified: Secondary | ICD-10-CM

## 2021-05-18 NOTE — Progress Notes (Signed)
Daughter stated that due to her low sodium levels pt was reffered to nephrologist and is scheduled to see them today at 3:20pm.

## 2021-05-19 ENCOUNTER — Other Ambulatory Visit: Payer: Self-pay

## 2021-05-19 DIAGNOSIS — E785 Hyperlipidemia, unspecified: Secondary | ICD-10-CM | POA: Diagnosis present

## 2021-05-19 DIAGNOSIS — I700 Atherosclerosis of aorta: Secondary | ICD-10-CM | POA: Diagnosis not present

## 2021-05-19 DIAGNOSIS — Z20822 Contact with and (suspected) exposure to covid-19: Secondary | ICD-10-CM | POA: Diagnosis present

## 2021-05-19 DIAGNOSIS — J690 Pneumonitis due to inhalation of food and vomit: Secondary | ICD-10-CM | POA: Diagnosis present

## 2021-05-19 DIAGNOSIS — J9601 Acute respiratory failure with hypoxia: Secondary | ICD-10-CM | POA: Diagnosis present

## 2021-05-19 DIAGNOSIS — I5032 Chronic diastolic (congestive) heart failure: Secondary | ICD-10-CM | POA: Diagnosis present

## 2021-05-19 DIAGNOSIS — R55 Syncope and collapse: Secondary | ICD-10-CM | POA: Diagnosis not present

## 2021-05-19 DIAGNOSIS — R42 Dizziness and giddiness: Secondary | ICD-10-CM | POA: Diagnosis not present

## 2021-05-19 DIAGNOSIS — M6281 Muscle weakness (generalized): Secondary | ICD-10-CM | POA: Diagnosis not present

## 2021-05-19 DIAGNOSIS — G9341 Metabolic encephalopathy: Secondary | ICD-10-CM | POA: Diagnosis present

## 2021-05-19 DIAGNOSIS — I959 Hypotension, unspecified: Secondary | ICD-10-CM | POA: Diagnosis present

## 2021-05-19 DIAGNOSIS — Z7901 Long term (current) use of anticoagulants: Secondary | ICD-10-CM

## 2021-05-19 DIAGNOSIS — R6520 Severe sepsis without septic shock: Secondary | ICD-10-CM | POA: Diagnosis not present

## 2021-05-19 DIAGNOSIS — Z888 Allergy status to other drugs, medicaments and biological substances status: Secondary | ICD-10-CM | POA: Diagnosis not present

## 2021-05-19 DIAGNOSIS — E871 Hypo-osmolality and hyponatremia: Secondary | ICD-10-CM | POA: Diagnosis present

## 2021-05-19 DIAGNOSIS — A419 Sepsis, unspecified organism: Secondary | ICD-10-CM | POA: Diagnosis not present

## 2021-05-19 DIAGNOSIS — Z820 Family history of epilepsy and other diseases of the nervous system: Secondary | ICD-10-CM | POA: Diagnosis not present

## 2021-05-19 DIAGNOSIS — Z7983 Long term (current) use of bisphosphonates: Secondary | ICD-10-CM

## 2021-05-19 DIAGNOSIS — J101 Influenza due to other identified influenza virus with other respiratory manifestations: Secondary | ICD-10-CM | POA: Diagnosis present

## 2021-05-19 DIAGNOSIS — Z83511 Family history of glaucoma: Secondary | ICD-10-CM

## 2021-05-19 DIAGNOSIS — Z79899 Other long term (current) drug therapy: Secondary | ICD-10-CM

## 2021-05-19 DIAGNOSIS — I248 Other forms of acute ischemic heart disease: Secondary | ICD-10-CM | POA: Diagnosis present

## 2021-05-19 DIAGNOSIS — M810 Age-related osteoporosis without current pathological fracture: Secondary | ICD-10-CM | POA: Diagnosis present

## 2021-05-19 DIAGNOSIS — R000 Tachycardia, unspecified: Secondary | ICD-10-CM | POA: Diagnosis not present

## 2021-05-19 DIAGNOSIS — I10 Essential (primary) hypertension: Secondary | ICD-10-CM | POA: Diagnosis not present

## 2021-05-19 DIAGNOSIS — R0689 Other abnormalities of breathing: Secondary | ICD-10-CM | POA: Diagnosis not present

## 2021-05-19 DIAGNOSIS — Z743 Need for continuous supervision: Secondary | ICD-10-CM | POA: Diagnosis not present

## 2021-05-19 DIAGNOSIS — C9110 Chronic lymphocytic leukemia of B-cell type not having achieved remission: Secondary | ICD-10-CM | POA: Diagnosis present

## 2021-05-19 DIAGNOSIS — Z90710 Acquired absence of both cervix and uterus: Secondary | ICD-10-CM | POA: Diagnosis not present

## 2021-05-19 DIAGNOSIS — I480 Paroxysmal atrial fibrillation: Secondary | ICD-10-CM | POA: Diagnosis present

## 2021-05-19 DIAGNOSIS — R0902 Hypoxemia: Secondary | ICD-10-CM | POA: Diagnosis not present

## 2021-05-19 DIAGNOSIS — Z8249 Family history of ischemic heart disease and other diseases of the circulatory system: Secondary | ICD-10-CM

## 2021-05-19 DIAGNOSIS — J9811 Atelectasis: Secondary | ICD-10-CM | POA: Diagnosis not present

## 2021-05-19 DIAGNOSIS — E039 Hypothyroidism, unspecified: Secondary | ICD-10-CM | POA: Diagnosis present

## 2021-05-19 DIAGNOSIS — K449 Diaphragmatic hernia without obstruction or gangrene: Secondary | ICD-10-CM | POA: Diagnosis not present

## 2021-05-19 DIAGNOSIS — R4182 Altered mental status, unspecified: Secondary | ICD-10-CM | POA: Diagnosis not present

## 2021-05-19 DIAGNOSIS — I110 Hypertensive heart disease with heart failure: Secondary | ICD-10-CM | POA: Diagnosis present

## 2021-05-19 LAB — URINALYSIS, ROUTINE W REFLEX MICROSCOPIC
Bilirubin Urine: NEGATIVE
Glucose, UA: NEGATIVE mg/dL
Ketones, ur: NEGATIVE mg/dL
Leukocytes,Ua: NEGATIVE
Nitrite: NEGATIVE
Protein, ur: 30 mg/dL — AB
Specific Gravity, Urine: 1.02 (ref 1.005–1.030)
pH: 7 (ref 5.0–8.0)

## 2021-05-19 LAB — CBC
HCT: 33.9 % — ABNORMAL LOW (ref 36.0–46.0)
Hemoglobin: 11.3 g/dL — ABNORMAL LOW (ref 12.0–15.0)
MCH: 30.5 pg (ref 26.0–34.0)
MCHC: 33.3 g/dL (ref 30.0–36.0)
MCV: 91.4 fL (ref 80.0–100.0)
Platelets: 163 10*3/uL (ref 150–400)
RBC: 3.71 MIL/uL — ABNORMAL LOW (ref 3.87–5.11)
RDW: 15 % (ref 11.5–15.5)
WBC: 9.8 10*3/uL (ref 4.0–10.5)
nRBC: 0 % (ref 0.0–0.2)

## 2021-05-19 LAB — URINALYSIS, MICROSCOPIC (REFLEX): Bacteria, UA: NONE SEEN

## 2021-05-19 LAB — TROPONIN I (HIGH SENSITIVITY): Troponin I (High Sensitivity): 20 ng/L — ABNORMAL HIGH (ref <18)

## 2021-05-19 LAB — BASIC METABOLIC PANEL
Anion gap: 8 (ref 5–15)
BUN: 14 mg/dL (ref 8–23)
CO2: 22 mmol/L (ref 22–32)
Calcium: 8.9 mg/dL (ref 8.9–10.3)
Chloride: 98 mmol/L (ref 98–111)
Creatinine, Ser: 0.8 mg/dL (ref 0.44–1.00)
GFR, Estimated: >60 mL/min (ref >60)
Glucose, Bld: 131 mg/dL — ABNORMAL HIGH (ref 70–99)
Potassium: 3.7 mmol/L (ref 3.5–5.1)
Sodium: 128 mmol/L — ABNORMAL LOW (ref 135–145)

## 2021-05-19 LAB — CBG MONITORING, ED: Glucose-Capillary: 136 mg/dL — ABNORMAL HIGH (ref 70–99)

## 2021-05-19 NOTE — ED Triage Notes (Signed)
Generalized weakness for a few days. Family noticed she was acting altered.today.

## 2021-05-20 ENCOUNTER — Inpatient Hospital Stay: Payer: Medicare Other

## 2021-05-20 ENCOUNTER — Encounter: Payer: Self-pay | Admitting: Internal Medicine

## 2021-05-20 ENCOUNTER — Emergency Department: Payer: Medicare Other

## 2021-05-20 ENCOUNTER — Inpatient Hospital Stay
Admission: EM | Admit: 2021-05-20 | Discharge: 2021-05-23 | DRG: 193 | Disposition: A | Discharge status: Home Health | Payer: Medicare Other | Attending: Internal Medicine | Admitting: Internal Medicine

## 2021-05-20 DIAGNOSIS — R42 Dizziness and giddiness: Secondary | ICD-10-CM | POA: Diagnosis not present

## 2021-05-20 DIAGNOSIS — Z83511 Family history of glaucoma: Secondary | ICD-10-CM | POA: Diagnosis not present

## 2021-05-20 DIAGNOSIS — G9341 Metabolic encephalopathy: Secondary | ICD-10-CM | POA: Diagnosis present

## 2021-05-20 DIAGNOSIS — C9110 Chronic lymphocytic leukemia of B-cell type not having achieved remission: Secondary | ICD-10-CM | POA: Diagnosis present

## 2021-05-20 DIAGNOSIS — I480 Paroxysmal atrial fibrillation: Secondary | ICD-10-CM | POA: Diagnosis present

## 2021-05-20 DIAGNOSIS — R778 Other specified abnormalities of plasma proteins: Secondary | ICD-10-CM

## 2021-05-20 DIAGNOSIS — Z7983 Long term (current) use of bisphosphonates: Secondary | ICD-10-CM | POA: Diagnosis not present

## 2021-05-20 DIAGNOSIS — J9601 Acute respiratory failure with hypoxia: Secondary | ICD-10-CM | POA: Diagnosis present

## 2021-05-20 DIAGNOSIS — R0902 Hypoxemia: Secondary | ICD-10-CM

## 2021-05-20 DIAGNOSIS — Z7901 Long term (current) use of anticoagulants: Secondary | ICD-10-CM | POA: Diagnosis not present

## 2021-05-20 DIAGNOSIS — I248 Other forms of acute ischemic heart disease: Secondary | ICD-10-CM | POA: Diagnosis present

## 2021-05-20 DIAGNOSIS — I959 Hypotension, unspecified: Secondary | ICD-10-CM

## 2021-05-20 DIAGNOSIS — J101 Influenza due to other identified influenza virus with other respiratory manifestations: Secondary | ICD-10-CM | POA: Diagnosis present

## 2021-05-20 DIAGNOSIS — M6281 Muscle weakness (generalized): Secondary | ICD-10-CM | POA: Diagnosis not present

## 2021-05-20 DIAGNOSIS — Z8249 Family history of ischemic heart disease and other diseases of the circulatory system: Secondary | ICD-10-CM | POA: Diagnosis not present

## 2021-05-20 DIAGNOSIS — E871 Hypo-osmolality and hyponatremia: Secondary | ICD-10-CM | POA: Diagnosis present

## 2021-05-20 DIAGNOSIS — I5032 Chronic diastolic (congestive) heart failure: Secondary | ICD-10-CM | POA: Diagnosis present

## 2021-05-20 DIAGNOSIS — E785 Hyperlipidemia, unspecified: Secondary | ICD-10-CM | POA: Diagnosis present

## 2021-05-20 DIAGNOSIS — E039 Hypothyroidism, unspecified: Secondary | ICD-10-CM | POA: Diagnosis present

## 2021-05-20 DIAGNOSIS — Z20822 Contact with and (suspected) exposure to covid-19: Secondary | ICD-10-CM | POA: Diagnosis present

## 2021-05-20 DIAGNOSIS — I110 Hypertensive heart disease with heart failure: Secondary | ICD-10-CM | POA: Diagnosis present

## 2021-05-20 DIAGNOSIS — M810 Age-related osteoporosis without current pathological fracture: Secondary | ICD-10-CM | POA: Diagnosis present

## 2021-05-20 DIAGNOSIS — R531 Weakness: Secondary | ICD-10-CM

## 2021-05-20 DIAGNOSIS — Z820 Family history of epilepsy and other diseases of the nervous system: Secondary | ICD-10-CM | POA: Diagnosis not present

## 2021-05-20 DIAGNOSIS — J690 Pneumonitis due to inhalation of food and vomit: Secondary | ICD-10-CM | POA: Diagnosis present

## 2021-05-20 DIAGNOSIS — Z90710 Acquired absence of both cervix and uterus: Secondary | ICD-10-CM | POA: Diagnosis not present

## 2021-05-20 DIAGNOSIS — R059 Cough, unspecified: Secondary | ICD-10-CM

## 2021-05-20 DIAGNOSIS — R7989 Other specified abnormal findings of blood chemistry: Secondary | ICD-10-CM

## 2021-05-20 DIAGNOSIS — Z79899 Other long term (current) drug therapy: Secondary | ICD-10-CM | POA: Diagnosis not present

## 2021-05-20 DIAGNOSIS — W19XXXA Unspecified fall, initial encounter: Secondary | ICD-10-CM

## 2021-05-20 DIAGNOSIS — R509 Fever, unspecified: Secondary | ICD-10-CM

## 2021-05-20 DIAGNOSIS — Z888 Allergy status to other drugs, medicaments and biological substances status: Secondary | ICD-10-CM | POA: Diagnosis not present

## 2021-05-20 DIAGNOSIS — R4182 Altered mental status, unspecified: Secondary | ICD-10-CM

## 2021-05-20 LAB — HEPATIC FUNCTION PANEL
ALT: 19 U/L (ref 0–44)
AST: 26 U/L (ref 15–41)
Albumin: 3.8 g/dL (ref 3.5–5.0)
Alkaline Phosphatase: 27 U/L — ABNORMAL LOW (ref 38–126)
Bilirubin, Direct: <0.1 mg/dL (ref 0.0–0.2)
Total Bilirubin: 0.6 mg/dL (ref 0.3–1.2)
Total Protein: 7.2 g/dL (ref 6.5–8.1)

## 2021-05-20 LAB — BASIC METABOLIC PANEL
Anion gap: 5 (ref 5–15)
BUN: 11 mg/dL (ref 8–23)
CO2: 22 mmol/L (ref 22–32)
Calcium: 7.8 mg/dL — ABNORMAL LOW (ref 8.9–10.3)
Chloride: 100 mmol/L (ref 98–111)
Creatinine, Ser: 0.72 mg/dL (ref 0.44–1.00)
GFR, Estimated: >60 mL/min (ref >60)
Glucose, Bld: 138 mg/dL — ABNORMAL HIGH (ref 70–99)
Potassium: 3.6 mmol/L (ref 3.5–5.1)
Sodium: 127 mmol/L — ABNORMAL LOW (ref 135–145)

## 2021-05-20 LAB — CBC
HCT: 26.3 % — ABNORMAL LOW (ref 36.0–46.0)
Hemoglobin: 8.9 g/dL — ABNORMAL LOW (ref 12.0–15.0)
MCH: 30.7 pg (ref 26.0–34.0)
MCHC: 33.8 g/dL (ref 30.0–36.0)
MCV: 90.7 fL (ref 80.0–100.0)
Platelets: 140 10*3/uL — ABNORMAL LOW (ref 150–400)
RBC: 2.9 MIL/uL — ABNORMAL LOW (ref 3.87–5.11)
RDW: 15.3 % (ref 11.5–15.5)
WBC: 10.4 10*3/uL (ref 4.0–10.5)
nRBC: 0 % (ref 0.0–0.2)

## 2021-05-20 LAB — RESPIRATORY PANEL BY PCR

## 2021-05-20 LAB — TROPONIN I (HIGH SENSITIVITY)
Troponin I (High Sensitivity): 33 ng/L — ABNORMAL HIGH (ref <18)
Troponin I (High Sensitivity): 75 ng/L — ABNORMAL HIGH (ref <18)

## 2021-05-20 LAB — RESP PANEL BY RT-PCR (FLU A&B, COVID) ARPGX2
Influenza A by PCR: POSITIVE — AB
Influenza B by PCR: NEGATIVE
SARS Coronavirus 2 by RT PCR: NEGATIVE

## 2021-05-20 LAB — CREATININE, SERUM
Creatinine, Ser: 0.73 mg/dL (ref 0.44–1.00)
GFR, Estimated: >60 mL/min (ref >60)

## 2021-05-20 LAB — PROCALCITONIN
Procalcitonin: <0.1 ng/mL
Procalcitonin: 0.2 ng/mL

## 2021-05-20 LAB — LACTIC ACID, PLASMA
Lactic Acid, Venous: 1.5 mmol/L (ref 0.5–1.9)
Lactic Acid, Venous: 2.7 mmol/L (ref 0.5–1.9)

## 2021-05-20 LAB — BRAIN NATRIURETIC PEPTIDE: B Natriuretic Peptide: 136.5 pg/mL — ABNORMAL HIGH (ref 0.0–100.0)

## 2021-05-20 MED ORDER — SODIUM CHLORIDE 0.9 % IV SOLN: INTRAVENOUS | Status: DC

## 2021-05-20 MED ORDER — LACTATED RINGERS IV BOLUS
2000.0000 mL | Freq: Once | INTRAVENOUS | Status: AC
  Administered 2021-05-20: 2000 mL via INTRAVENOUS

## 2021-05-20 MED ORDER — TIMOLOL MALEATE 0.5 % OP SOLN
1.0000 [drp] | Freq: Two times a day (BID) | OPHTHALMIC | Status: DC
Start: 2021-05-20 — End: 2021-05-23
  Administered 2021-05-20 – 2021-05-23 (×6): 1 [drp] via OPHTHALMIC
  Filled 2021-05-20: qty 5

## 2021-05-20 MED ORDER — ONDANSETRON HCL 4 MG/2ML IJ SOLN
4.0000 mg | Freq: Once | INTRAMUSCULAR | Status: AC
  Administered 2021-05-20: 4 mg via INTRAVENOUS
  Filled 2021-05-20: qty 2

## 2021-05-20 MED ORDER — POLYETHYLENE GLYCOL 3350 17 G PO PACK
17.0000 g | PACK | Freq: Every day | ORAL | Status: DC
  Administered 2021-05-21 – 2021-05-22 (×2): 17 g via ORAL
  Filled 2021-05-20 (×3): qty 1

## 2021-05-20 MED ORDER — GUAIFENESIN-DM 100-10 MG/5ML PO SYRP
5.0000 mL | ORAL_SOLUTION | Freq: Four times a day (QID) | ORAL | Status: DC | PRN
  Administered 2021-05-20 – 2021-05-21 (×3): 5 mL via ORAL
  Filled 2021-05-20 (×3): qty 5

## 2021-05-20 MED ORDER — OSELTAMIVIR PHOSPHATE 30 MG PO CAPS
30.0000 mg | ORAL_CAPSULE | Freq: Two times a day (BID) | ORAL | Status: DC
  Administered 2021-05-20 – 2021-05-22 (×4): 30 mg via ORAL
  Filled 2021-05-20 (×5): qty 1

## 2021-05-20 MED ORDER — LEVOTHYROXINE SODIUM 88 MCG PO TABS
88.0000 ug | ORAL_TABLET | ORAL | Status: DC
  Administered 2021-05-21: 88 ug via ORAL
  Filled 2021-05-20 (×2): qty 1

## 2021-05-20 MED ORDER — VITAMIN B-12 100 MCG PO TABS
100.0000 ug | ORAL_TABLET | Freq: Every day | ORAL | Status: DC
  Administered 2021-05-21 – 2021-05-23 (×3): 100 ug via ORAL
  Filled 2021-05-20 (×3): qty 1

## 2021-05-20 MED ORDER — IOHEXOL 350 MG/ML SOLN
75.0000 mL | Freq: Once | INTRAVENOUS | Status: AC | PRN
  Administered 2021-05-20: 75 mL via INTRAVENOUS

## 2021-05-20 MED ORDER — SODIUM CHLORIDE 0.9 % IV SOLN
3.0000 g | Freq: Four times a day (QID) | INTRAVENOUS | Status: DC
  Administered 2021-05-20 – 2021-05-22 (×8): 3 g via INTRAVENOUS
  Filled 2021-05-20 (×4): qty 8
  Filled 2021-05-20: qty 3
  Filled 2021-05-20: qty 8
  Filled 2021-05-20: qty 3
  Filled 2021-05-20 (×3): qty 8
  Filled 2021-05-20: qty 3
  Filled 2021-05-20: qty 8

## 2021-05-20 MED ORDER — ACETAMINOPHEN 500 MG PO TABS
1000.0000 mg | ORAL_TABLET | Freq: Once | ORAL | Status: AC
  Administered 2021-05-20: 1000 mg via ORAL
  Filled 2021-05-20: qty 2

## 2021-05-20 MED ORDER — ONDANSETRON HCL 4 MG PO TABS: 4.0000 mg | ORAL_TABLET | Freq: Four times a day (QID) | ORAL | Status: DC | PRN

## 2021-05-20 MED ORDER — ACETAMINOPHEN 650 MG RE SUPP
650.0000 mg | Freq: Four times a day (QID) | RECTAL | Status: DC | PRN
  Filled 2021-05-20: qty 1

## 2021-05-20 MED ORDER — OSELTAMIVIR PHOSPHATE 6 MG/ML PO SUSR
75.0000 mg | Freq: Once | ORAL | Status: AC
  Administered 2021-05-20: 75 mg via ORAL
  Filled 2021-05-20: qty 12.5

## 2021-05-20 MED ORDER — SENNA 8.6 MG PO TABS: 1.0000 | ORAL_TABLET | Freq: Every evening | ORAL | Status: DC | PRN

## 2021-05-20 MED ORDER — FERROUS SULFATE 325 (65 FE) MG PO TABS
325.0000 mg | ORAL_TABLET | Freq: Every day | ORAL | Status: DC
  Administered 2021-05-21 – 2021-05-23 (×3): 325 mg via ORAL
  Filled 2021-05-20 (×3): qty 1

## 2021-05-20 MED ORDER — AMLODIPINE BESYLATE 5 MG PO TABS
5.0000 mg | ORAL_TABLET | Freq: Every day | ORAL | Status: DC
  Filled 2021-05-20: qty 1

## 2021-05-20 MED ORDER — OSELTAMIVIR PHOSPHATE 6 MG/ML PO SUSR: 75.0000 mg | Freq: Two times a day (BID) | ORAL | Status: DC

## 2021-05-20 MED ORDER — ACETAMINOPHEN 325 MG PO TABS
650.0000 mg | ORAL_TABLET | Freq: Four times a day (QID) | ORAL | Status: DC | PRN
  Administered 2021-05-20 (×2): 650 mg via ORAL
  Filled 2021-05-20 (×2): qty 2

## 2021-05-20 MED ORDER — ENOXAPARIN SODIUM 40 MG/0.4ML IJ SOSY
40.0000 mg | PREFILLED_SYRINGE | INTRAMUSCULAR | Status: DC
  Administered 2021-05-20: 40 mg via SUBCUTANEOUS
  Filled 2021-05-20: qty 0.4

## 2021-05-20 MED ORDER — LISINOPRIL 20 MG PO TABS
20.0000 mg | ORAL_TABLET | Freq: Every day | ORAL | Status: DC
  Administered 2021-05-21 – 2021-05-23 (×2): 20 mg via ORAL
  Filled 2021-05-20 (×3): qty 1

## 2021-05-20 MED ORDER — ONDANSETRON HCL 4 MG/2ML IJ SOLN: 4.0000 mg | Freq: Four times a day (QID) | INTRAMUSCULAR | Status: DC | PRN

## 2021-05-20 MED ORDER — LATANOPROST 0.005 % OP SOLN
1.0000 [drp] | Freq: Every day | OPHTHALMIC | Status: DC
  Administered 2021-05-20 – 2021-05-22 (×3): 1 [drp] via OPHTHALMIC
  Filled 2021-05-20: qty 2.5

## 2021-05-20 MED ORDER — LEVOTHYROXINE SODIUM 50 MCG PO TABS
75.0000 ug | ORAL_TABLET | ORAL | Status: DC
  Administered 2021-05-22: 09:00:00 75 ug via ORAL
  Filled 2021-05-20 (×3): qty 1

## 2021-05-20 MED ORDER — RIVAROXABAN 15 MG PO TABS
15.0000 mg | ORAL_TABLET | Freq: Every day | ORAL | Status: DC
  Administered 2021-05-21 – 2021-05-22 (×2): 15 mg via ORAL
  Filled 2021-05-20 (×3): qty 1

## 2021-05-20 MED ORDER — NETARSUDIL DIMESYLATE 0.02 % OP SOLN
1.0000 [drp] | Freq: Every day | OPHTHALMIC | Status: DC
  Administered 2021-05-21: 1 [drp] via OPHTHALMIC

## 2021-05-20 MED ORDER — OSELTAMIVIR PHOSPHATE 6 MG/ML PO SUSR
30.0000 mg | Freq: Two times a day (BID) | ORAL | Status: DC
  Filled 2021-05-20 (×2): qty 12.5

## 2021-05-20 MED ORDER — ORAL CARE MOUTH RINSE
15.0000 mL | Freq: Two times a day (BID) | OROMUCOSAL | Status: DC
  Administered 2021-05-20 – 2021-05-23 (×6): 15 mL via OROMUCOSAL

## 2021-05-20 NOTE — ED Provider Notes (Addendum)
[facility] Emergency Department Provider Note  ____________________________________________   Event Date/Time   First MD Initiated Contact with Patient 05/20/21 0034     (approximate)  I have reviewed the triage vital signs and the nursing notes.   HISTORY  Chief Complaint Weakness (Generalized weakness for a few days. Family noticed she was acting altered.today. )    HPI Sydney Mcdaniel is a 87 y.o. female with history of hypertension, hyperlipidemia, hypothyroidism, paroxysmal atrial fibrillation not on anticoagulation, hyponatremia who presents to the emergency department generalized weakness progressively worsening over the past few days and altered mental status that started this afternoon.  Daughter provides most of the history.  She states patient lives at home alone and gets around using a walker.  She states about a week ago patient fell and was not brought to the emergency department.  It is unclear if she hit her head or lost consciousness.  Patient denies any injury from this fall.  Daughter states when she noticed the patient seemed to be more confused today she became concerned and thought this could be hyponatremia which she has had before.  She was told by her primary care doctor to decrease amount of fluid she was drinking.  No known fevers but has had a cough with yellow sputum production and a sore throat.  Daughter reports she has had chills as well.  No known sick contacts or recent travel.  No vomiting or diarrhea.  Patient denies headache, neck pain or neck stiffness, chest pain or shortness of breath, abdominal pain.  She denies numbness, tingling or focal weakness.        Past Medical History:  Diagnosis Date   Allergy    Anemia    Cataract    CLL (chronic lymphocytic leukemia) (HCC)    GERD (gastroesophageal reflux disease)    Glaucoma    H/O: hysterectomy    Total   Hyperlipidemia    Hypertension    Hypothyroidism    Impaired  fasting glucose    Lichen sclerosus    Osteoporosis    Hips   PAF (paroxysmal atrial fibrillation) ([place])    a. 06/15/2016 Event monitor: 4% afib burden; b. CHA2DS2VASc - 4-->Xarelto.   Syncope    a. 03/2016 Echo: EF55-60%, no rwma, mild AI/MR, nl PASP; b. 03/2016 48h Holter: no significant arrhythmias/pauses; c. 03/2016 MV: mild apical defect, likely breast attenuation, nl EF, low risk; d. 05/2016 Event monitor: No significant arrhythmia; e. 05/2016 Event monitor: PAF (4%).    Patient Active Problem List   Diagnosis Date Noted   Fall 02/11/2021   Normocytic anemia 05/31/2020   Acute colitis 04/11/2020   GI bleeding 04/11/2020   Iron deficiency anemia 03/08/2020   Goals of care, counseling/discussion 03/08/2020   Positive direct antiglobulin test (DAT) 12/04/2019   Sinus bradycardia 04/09/2019   First degree AV block 96/78/9381   Acute metabolic encephalopathy 01/75/1025   Osteoporosis 08/21/2018   Osteoarthritis of left hip 05/21/2018   Vasovagal syncope 04/19/2018   Bilateral carotid artery stenosis 04/19/2018   History of syncope 10/17/2017   MCI (mild cognitive impairment) 08/12/2017   Lung nodules 07/09/2017   Paroxysmal atrial fibrillation ([place]) 04/18/2017   Pleural effusion 10/20/2016   Hyponatremia 10/20/2016   Chronic diastolic congestive heart failure ([place]) 06/28/2016   Primary open-angle glaucoma, bilateral, mild stage 05/08/2016   Labile blood glucose 04/27/2016   Bladder prolapse, female, acquired 02/25/2016   Asymptomatic bacteriuria 02/25/2016   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  B12 deficiency 01/11/2016   Anemia 01/10/2016   CLL (chronic lymphocytic leukemia) ([place]) 01/10/2016   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 12/21/2015   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 12/21/2015   Bradycardia 12/21/2015   Lymphocytosis 06/25/2015   Impaired fasting glucose 06/22/2015   Carotid atherosclerosis 06/22/2015   Essential hypertension 12/22/2014    Hyperlipidemia 12/22/2014   Hypothyroidism 12/22/2014    Past Surgical History:  Procedure Laterality Date   APPENDECTOMY     EYE SURGERY     Glaucoma   MULTIPLE TOOTH EXTRACTIONS N/A    2 teeth   TOTAL ABDOMINAL HYSTERECTOMY      Prior to Admission medications   Medication Sig Start Date End Date Taking? Authorizing Provider  alendronate (FOSAMAX) 70 MG tablet TAKE 1 TABLET BY MOUTH ONCE A WEEK WITH A FULL GLASS OF WATER ON AN EMPTY STOMACH 05/12/21  Yes Kathrine Haddock, NP  amLODipine (NORVASC) 5 MG tablet Take 5 mg by mouth daily. Take 2.5 mg to 5 mg as needed   Yes [provider]  ferrous sulfate 325 (65 FE) MG tablet Take 325 mg by mouth 2 (two) times daily with a meal.   Yes [provider]  latanoprost (XALATAN) 0.005 % ophthalmic solution Place 1 drop into the right eye at bedtime.  04/24/15  Yes [provider]  levothyroxine (EUTHYROX) 75 MCG tablet TAKE ONE TABLET BY MOUTH ON MONDAYS, [place], FRIDAYS, AND SUNDAYS 04/08/20  Yes Delsa Grana, PA-C  levothyroxine (EUTHYROX) 88 MCG tablet TAKE 1 TABLET BY MOUTH ON TUESDAYS, THURSDAYS, AND SATURDAYS 04/08/20  Yes Delsa Grana, PA-C  lisinopril (ZESTRIL) 20 MG tablet Take 1 tablet by mouth once daily 04/18/21  Yes End, Harrell Gave, MD  polyethylene glycol (MIRALAX / GLYCOLAX) 17 g packet Take 17 g by mouth daily.   Yes [provider]  Rivaroxaban (XARELTO) 15 MG TABS tablet Take 1 tablet (15 mg total) by mouth daily. 06/28/20  Yes Loel Dubonnet, NP  senna (SENOKOT) 8.6 MG tablet Take 1 tablet by mouth daily.   Yes [provider]  timolol (TIMOPTIC) 0.5 % ophthalmic solution Place 1 drop into the right eye 2 (two) times daily.  01/01/20  Yes [provider]  vitamin B-12 (CYANOCOBALAMIN) 100 MCG tablet Take 100 mcg by mouth daily.   Yes [provider]  lisinopril (ZESTRIL) 20 MG tablet Take by mouth. 08/09/10   [provider]    Allergies Other,  Alphagan [brimonidine], Pravachol [pravastatin sodium], and Pravastatin  Family History  Problem Relation Age of Onset   Heart attack Mother    Glaucoma Mother    Heart attack Father    Parkinson's disease Brother    Heart attack Brother     Social History Social History   Tobacco Use   Smoking status: Never   Smokeless tobacco: Never  Vaping Use   Vaping Use: Never used  Substance Use Topics   Alcohol use: No   Drug use: No    Review of Systems Constitutional: No fever.  + Chills. Eyes: No visual changes. ENT: No sore throat. Cardiovascular: Denies chest pain. Respiratory: Denies shortness of breath. Gastrointestinal: No nausea, vomiting, diarrhea. Genitourinary: Negative for dysuria. Musculoskeletal: Negative for back pain. Skin: Negative for rash. Neurological: Negative for focal weakness or numbness.  ____________________________________________   PHYSICAL EXAM:  VITAL SIGNS: ED Triage Vitals  Enc Vitals Group     BP 05/19/21 2031 (!) 175/61     Pulse Rate  05/19/21 2031 (!) 103     Resp 05/19/21 2031 18     Temp 05/19/21 2031 99.3 F (37.4 C)     Temp Source 05/19/21 2031 Oral     SpO2 05/19/21 2031 93 %     Weight 05/19/21 2032 135 lb (61.2 kg)     Height 05/19/21 2032 5\' 2"  (1.575 m)     Head Circumference --      Peak Flow --      Pain Score --      Pain Loc --      Pain Edu? --      Excl. in GC? --    CONSTITUTIONAL: Alert and oriented x 3 and responds appropriately to questions.  Elderly HEAD: Normocephalic, atraumatic EYES: Conjunctivae clear, pupils appear equal, EOM appear intact ENT: normal nose; moist mucous membranes NECK: Supple, normal ROM, no midline spinal tenderness or step-off or deformity, no meningismus CARD: Regular and slightly tachycardic; S1 and S2 appreciated; no murmurs, no clicks, no rubs, no gallops RESP: Patient has slightly rhonchorous breath sounds but no wheezing, rales.  Sats 92% on room air at rest.  Mildly  tachypneic.  No respiratory distress.  Speaking full sentences. ABD/GI: Normal bowel sounds; non-distended; soft, non-tender, no rebound, no guarding, no peritoneal signs, no hepatosplenomegaly BACK: The back appears normal, no midline spinal tenderness or step-off or deformity EXT: Normal ROM in all joints; no deformity noted, no edema; no cyanosis, no calf tenderness or calf swelling SKIN: Normal color for age and race; warm; no rash on exposed skin NEURO: Moves all extremities equally, no drift, sensation to light touch intact diffusely, cranial nerves II through XII intact, normal speech, patient has difficulty standing on her own and requires 2 people to assist her from a recliner into a bed PSYCH: The patient's mood and manner are appropriate.  ____________________________________________   LABS (all labs ordered are listed, but only abnormal results are displayed)  Labs Reviewed  RESP PANEL BY RT-PCR (FLU A&B, COVID) ARPGX2 - Abnormal; Notable for the following components:      Result Value   Influenza A by PCR POSITIVE (*)    All other components within normal limits  BASIC METABOLIC PANEL - Abnormal; Notable for the following components:   Sodium 128 (*)    Glucose, Bld 131 (*)    All other components within normal limits  CBC - Abnormal; Notable for the following components:   RBC 3.71 (*)    Hemoglobin 11.3 (*)    HCT 33.9 (*)    All other components within normal limits  URINALYSIS, ROUTINE W REFLEX MICROSCOPIC - Abnormal; Notable for the following components:   Hgb urine dipstick TRACE (*)    Protein, ur 30 (*)    All other components within normal limits  HEPATIC FUNCTION PANEL - Abnormal; Notable for the following components:   Alkaline Phosphatase 27 (*)    All other components within normal limits  BRAIN NATRIURETIC PEPTIDE - Abnormal; Notable for the following components:   B Natriuretic Peptide 136.5 (*)    All other components within normal limits  CBG  MONITORING, ED - Abnormal; Notable for the following components:   Glucose-Capillary 136 (*)    All other components within normal limits  TROPONIN I (HIGH SENSITIVITY) - Abnormal; Notable for the following components:   Troponin I (High Sensitivity) 20 (*)    All other components within normal limits  TROPONIN I (HIGH SENSITIVITY) - Abnormal; Notable for the following components:  Troponin I (High Sensitivity) 33 (*)    All other components within normal limits  CULTURE, BLOOD (ROUTINE X 2)  CULTURE, BLOOD (ROUTINE X 2)  URINE CULTURE  RESPIRATORY PANEL BY PCR  URINALYSIS, MICROSCOPIC (REFLEX)  PROCALCITONIN  LACTIC ACID, PLASMA   ____________________________________________  EKG   EKG Interpretation  Date/Time:  Thursday May 19 2021 20:40:36 EST Ventricular Rate:  103 PR Interval:    QRS Duration: 126 QT Interval:  382 QTC Calculation: 500 R Axis:   75 Text Interpretation: Sinus tachycardia Non-specific intra-ventricular conduction block T wave abnormality, consider lateral ischemia Abnormal ECG No significant change since last tracing Confirmed by Pryor Curia [phone]) on 05/20/2021 4:24:04 AM        ____________________________________________  RADIOLOGY Jessie Foot Armanda Forand, personally viewed and evaluated these images (plain radiographs) as part of my medical decision making, as well as reviewing the written report by the radiologist.  ED MD interpretation: CT head shows no acute abnormality.  Chest x-ray clear.  Official radiology report(s): DG Chest 2 View  Result Date: 05/20/2021 CLINICAL DATA:  Cough and chills. EXAM: CHEST - 2 VIEW COMPARISON:  Chest radiograph dated 02/11/2021. FINDINGS: Minimal bibasilar atelectasis. No focal consolidation, pleural effusion, pneumothorax. The cardiac silhouette is within limits. Atherosclerotic calcification of the aorta. Osteopenia with degenerative changes of the spine. No acute osseous pathology. IMPRESSION: No active  cardiopulmonary disease. Electronically Signed   By: Anner Crete M.D.   On: 05/20/2021 01:20   CT HEAD WO CONTRAST (5MM)  Result Date: 05/20/2021 CLINICAL DATA:  Generalized weakness for a few days EXAM: CT HEAD WITHOUT CONTRAST TECHNIQUE: Contiguous axial images were obtained from the base of the skull through the vertex without intravenous contrast. COMPARISON:  03/28/2021 FINDINGS: Brain: No evidence of acute infarction, hemorrhage, cerebral edema, mass, mass effect, or midline shift. No hydrocephalus or extra-axial fluid collection. Moderate to severe periventricular white matter changes, likely the sequela of chronic small vessel ischemic disease. Unchanged diffuse cerebral volume loss. Vascular: No hyperdense vessel. Skull: Normal. Negative for fracture or focal lesion. Sinuses/Orbits: Mucous retention cyst in the left maxillary sinus. Mild mucosal thickening in the ethmoid air cells. Status post bilateral lens replacements. Other: The mastoid air cells are well aerated. IMPRESSION: No acute intracranial process. Electronically Signed   By: Merilyn Baba M.D.   On: 05/20/2021 02:32    ____________________________________________   PROCEDURES  Procedure(s) performed (including Critical Care):  Procedures  CRITICAL CARE Performed by: Cyril Mourning Deavon Podgorski   Total critical care time: 65 minutes  Critical care time was exclusive of separately billable procedures and treating other patients.  Critical care was necessary to treat or prevent imminent or life-threatening deterioration.  Critical care was time spent personally by me on the following activities: development of treatment plan with patient and/or surrogate as well as nursing, discussions with consultants, evaluation of patient's response to treatment, examination of patient, obtaining history from patient or surrogate, ordering and performing treatments and interventions, ordering and review of laboratory studies, ordering and review  of radiographic studies, pulse oximetry and re-evaluation of patient's condition.  ____________________________________________   INITIAL IMPRESSION / ASSESSMENT AND PLAN / ED COURSE  As part of my medical decision making, I reviewed the following data within the [place] History obtained from family, Nursing notes reviewed and incorporated, Labs reviewed , EKG interpreted , Old EKG reviewed, Old chart reviewed, Radiograph reviewed , Discussed with admitting physician , and Notes from prior ED visits  Patient here with complaints of generalized weakness, confusion.  Symptoms worsening over the past few days.  Daughter was concerned this was hyponatremia however sodium is only 128 today.  She does have a low-grade oral temperature of 99.3 and she reports having a productive cough and sore throat.  I am concerned that she could have an infectious etiology causing her symptoms.  Will obtain rectal temp, cultures, urine, chest x-ray, head CT.  Low suspicion for stroke or intracranial hemorrhage but this is also on the differential.  No sign of metabolic derangement, hypoglycemia, hyperglycemia causing her symptoms.  ED PROGRESS  Patient's oxygen saturation has been borderline low at 92% on room air.  She does not wear oxygen chronically.  Her chest x-ray was reviewed by myself and radiology shows no infiltrate, edema or pneumothorax.  CT of her head also reviewed shows no acute abnormality.  Procalcitonin and lactic normal.  She is positive for influenza A.  We will start Tamiflu.  Discussed with hospitalist for admission.   Discussed patient's case with hospitalist, Dr. Damita Dunnings.  I have recommended admission and patient (and family if present) agree with this plan. Admitting physician will place admission orders.   I reviewed all nursing notes, vitals, pertinent previous records and reviewed/interpreted all EKGs, lab and urine results, imaging (as available).  On  reevaluation, patient is now hypoxic and hypotensive.  Discussed with hospitalist who agrees with 30 mL/kg IV fluid bolus but will hold broad-spectrum antibiotics given this seems to be sepsis due to influenza rather than a bacterial infection especially given a normal procalcitonin.  Given patient seems to be worsening at this time we did discuss CODE STATUS with patient's daughter.  Daughter states the patient is a DNI but she is not sure if the patient would want chest compressions.  We will continue to closely monitor.  4:45 AM  Pt's blood pressure improving significantly with fluids as is her mental status. ____________________________________________   FINAL CLINICAL IMPRESSION(S) / ED DIAGNOSES  Final diagnoses:  Altered mental status, unspecified altered mental status type  Generalized weakness  Fall, initial encounter  Cough, unspecified type  Hyponatremia  Fever, unspecified fever cause  Influenza A  Sepsis with hypotension ([place])  Acute respiratory failure with hypoxia [facility])     ED Discharge Orders     None       *Please note:  Sydney Mcdaniel was evaluated in Emergency Department on 05/20/2021 for the symptoms described in the history of present illness. She was evaluated in the context of the global COVID-19 pandemic, which necessitated consideration that the patient might be at risk for infection with the SARS-CoV-2 virus that causes COVID-19. Institutional protocols and algorithms that pertain to the evaluation of patients at risk for COVID-19 are in a state of rapid change based on information released by regulatory bodies including the CDC and federal and state organizations. These policies and algorithms were followed during the patient's care in the ED.  Some ED evaluations and interventions may be delayed as a result of limited staffing during and the pandemic.*   Note:  This document was prepared using Dragon voice recognition software and may include unintentional  dictation errors.    Ryer Asato, Delice Bison, DO 05/20/21 Stewart, Delice Bison, DO 05/20/21 [phone]

## 2021-05-20 NOTE — Consult Note (Signed)
Pharmacy Antibiotic Note  Sydney Mcdaniel is a 87 y.o. female admitted on 05/20/2021 with  aspiration pneumonia .  Pharmacy has been consulted for Unasyn dosing.  Plan: Unasyn 3gm IVPB q 6 hours  Height: 5\' 2"  (157.5 cm) Weight: 61.2 kg (135 lb) IBW/kg (Calculated) : 50.1  Temp (24hrs), Avg:99.9 F (37.7 C), Min:98.4 F (36.9 C), Max:102.2 F (39 C)  Recent Labs  Lab 05/19/21 2052 05/20/21 0104 05/20/21 0519 05/20/21 1502  WBC 9.8  --  10.4  --   CREATININE 0.80  --  0.73 0.72  LATICACIDVEN  --  1.5  --  2.7*    Estimated Creatinine Clearance: 37.8 mL/min (by C-G formula based on SCr of 0.72 mg/dL).    Allergies  Allergen Reactions   Other    Alphagan [Brimonidine] Itching and Rash   Pravachol [Pravastatin Sodium] Itching and Rash   Pravastatin Itching, Other (See Comments) and Rash    Antimicrobials this admission: 11/25 Unasyn >>  11/25 Tamiflu >>   Dose adjustments this admission: none  Microbiology results: 11/25 BCx: pending  Thank you for allowing pharmacy to be a part of this patient's care.  Nadine Ryle Rodriguez-Guzman PharmD, BCPS 05/20/2021 4:41 PM

## 2021-05-20 NOTE — ED Notes (Signed)
Pt given dinner tray.

## 2021-05-20 NOTE — ED Notes (Signed)
Securely messaged MD ayiku about troponin trending

## 2021-05-20 NOTE — ED Notes (Signed)
Called RT about pt o2 needs. Messaged attenting about pt coughing secretions.

## 2021-05-20 NOTE — Progress Notes (Signed)
PHARMACY NOTE:  ANTIMICROBIAL RENAL DOSAGE ADJUSTMENT  Current antimicrobial/antiviral regimen includes a mismatch between antimicrobial dosage and estimated renal function.  As per policy approved by the Pharmacy & Therapeutics and Medical Executive Committees, the antimicrobial dosage will be adjusted accordingly.  Current antimicrobial dosage:  tamiflu 75 mg BID  Indication: influenza  Renal Function:  Estimated Creatinine Clearance: 37.8 mL/min (by C-G formula based on SCr of 0.73 mg/dL).     Antimicrobial dosage has been changed to:  Tamiflu 30 mg BID  for Crcl 37.8 ml/min  Additional comments:   Thank you for allowing pharmacy to be a part of this patient's care.  Naika Noto A, [facility] 05/20/2021 8:14 AM

## 2021-05-20 NOTE — ED Notes (Signed)
Pt titrated to 6L [place]. RT called

## 2021-05-20 NOTE — ED Notes (Signed)
Pharmacy contacted to reconcile patients medications.

## 2021-05-20 NOTE — ED Notes (Signed)
Pt to CT

## 2021-05-20 NOTE — Progress Notes (Addendum)
Patient seen and examined at the bedside.  She complains of productive cough.  She sounds congested.  She has worsening hypoxia requiring up to 4 L/min oxygen while I was at the bedside.  Eventually, she was requiring up to 12 L/min oxygen via HFNC.  Repeat chest x-ray was unremarkable.  Discontinue IV fluids.  Repeat BMP, troponin and lactic acid.  CTA chest has been ordered to rule out acute pulmonary embolism.  Plan was discussed with [name], her daughter, at the bedside.

## 2021-05-20 NOTE — ED Notes (Signed)
Patient is much more alert. Initiating conversation, answering appropriately. Family at bedside.

## 2021-05-20 NOTE — ED Notes (Signed)
Pt repositioned, brief changed, bedding changes, and purewick replaced

## 2021-05-20 NOTE — ED Notes (Signed)
MD Ayiku alerted via secure messaing of lactic acid result 2.7

## 2021-05-20 NOTE — H&P (Addendum)
History and Physical    Sydney Mcdaniel ION:[id number] DOB: 1925-01-14 DOA: 05/20/2021  PCP: [facility]   Patient coming from: home  I have personally briefly reviewed patient's relevant medical records in [place]  Chief Complaint: weakness  HPI: Sydney Mcdaniel is a 87 y.o. female with medical history significant for Paroxysmal A. fib on Xarelto, hypothyroidism, glaucoma, CLL followed by oncology hypertension,  chronic hyponatremia who lives independently with her daughter nearby who checks in on her and who was in her usual state of health until the day of arrival when she started feeling unwell.  Most of the history is given by the daughter at the bedside who stated her mother came to her house for Thanksgiving but was noted to have a cough but was doing okay.  After she returned to home she called to say that she was not feeling well and they brought her to the hospital.  History is limited due to patient's clinical condition as she is very lethargic.  Daughter states that she sometimes gets like this when her sodium is low and she thought that this is what was going on with her.  Patient has been hospitalized as recently as a month prior with symptomatic hyponatremia  ED course: T-max 102.2.  BP initially 175/61, dropping as low as 85/49, fluid responsive to 126/46, tachycardic to 106 Blood work with WBC 9.8, lactic acid 1.5 and procalcitonin less than 0.1 Troponin 20-33, BNP 136 Sodium 128  EKG, personally viewed and interpreted: Sinus tachycardia at 103 with no acute ST-T wave changes  Imaging: Chest x-ray with no active cardiopulmonary disease CT head no acute intracranial process  Patient received an IV fluid bolus in the ED and started on Tamiflu.  Hospitalist consulted for admission.  Review of Systems: Limited due to altered mental status  Past Medical History:  Diagnosis Date   Allergy    Anemia    Cataract    CLL (chronic lymphocytic  leukemia) (HCC)    GERD (gastroesophageal reflux disease)    Glaucoma    H/O: hysterectomy    Total   Hyperlipidemia    Hypertension    Hypothyroidism    Impaired fasting glucose    Lichen sclerosus    Osteoporosis    Hips   PAF (paroxysmal atrial fibrillation) ([place])    a. 06/15/2016 Event monitor: 4% afib burden; b. CHA2DS2VASc - 4-->Xarelto.   Syncope    a. 03/2016 Echo: EF55-60%, no rwma, mild AI/MR, nl PASP; b. 03/2016 48h Holter: no significant arrhythmias/pauses; c. 03/2016 MV: mild apical defect, likely breast attenuation, nl EF, low risk; d. 05/2016 Event monitor: No significant arrhythmia; e. 05/2016 Event monitor: PAF (4%).    Past Surgical History:  Procedure Laterality Date   APPENDECTOMY     EYE SURGERY     Glaucoma   MULTIPLE TOOTH EXTRACTIONS N/A    2 teeth   TOTAL ABDOMINAL HYSTERECTOMY       reports that she has never smoked. She has never used smokeless tobacco. She reports that she does not drink alcohol and does not use drugs.  Allergies  Allergen Reactions   Other    Alphagan [Brimonidine] Itching and Rash   Pravachol [Pravastatin Sodium] Itching and Rash   Pravastatin Itching, Other (See Comments) and Rash    Family History  Problem Relation Age of Onset   Heart attack Mother    Glaucoma Mother    Heart attack Father    Parkinson's disease Brother  Heart attack Brother       Prior to Admission medications   Medication Sig Start Date End Date Taking? Authorizing Provider  alendronate (FOSAMAX) 70 MG tablet TAKE 1 TABLET BY MOUTH ONCE A WEEK WITH A FULL GLASS OF WATER ON AN EMPTY STOMACH 05/12/21  Yes Kathrine Haddock, NP  amLODipine (NORVASC) 5 MG tablet Take 5 mg by mouth daily. Take 2.5 mg to 5 mg as needed   Yes [provider]  ferrous sulfate 325 (65 FE) MG tablet Take 325 mg by mouth 2 (two) times daily with a meal.   Yes [provider]  latanoprost (XALATAN) 0.005 % ophthalmic solution Place 1 drop into the right eye  at bedtime.  04/24/15  Yes [provider]  levothyroxine (EUTHYROX) 75 MCG tablet TAKE ONE TABLET BY MOUTH ON MONDAYS, [place], FRIDAYS, AND SUNDAYS 04/08/20  Yes Delsa Grana, PA-C  levothyroxine (EUTHYROX) 88 MCG tablet TAKE 1 TABLET BY MOUTH ON TUESDAYS, THURSDAYS, AND SATURDAYS 04/08/20  Yes Delsa Grana, PA-C  lisinopril (ZESTRIL) 20 MG tablet Take 1 tablet by mouth once daily 04/18/21  Yes End, Harrell Gave, MD  polyethylene glycol (MIRALAX / GLYCOLAX) 17 g packet Take 17 g by mouth daily.   Yes [provider]  Rivaroxaban (XARELTO) 15 MG TABS tablet Take 1 tablet (15 mg total) by mouth daily. 06/28/20  Yes Loel Dubonnet, NP  senna (SENOKOT) 8.6 MG tablet Take 1 tablet by mouth daily.   Yes [provider]  timolol (TIMOPTIC) 0.5 % ophthalmic solution Place 1 drop into the right eye 2 (two) times daily.  01/01/20  Yes [provider]  vitamin B-12 (CYANOCOBALAMIN) 100 MCG tablet Take 100 mcg by mouth daily.   Yes [provider]  lisinopril (ZESTRIL) 20 MG tablet Take by mouth. 08/09/10   [provider]    Physical Exam: Vitals:   05/19/21 2032 05/20/21 0057 05/20/21 0352 05/20/21 0443  BP:  (!) 153/63 (!) 85/49 (!) 126/46  Pulse:  (!) 106 87 82  Resp:  20 (!) 24 20  Temp:  (!) 102.2 F (39 C) 99.5 F (37.5 C)   TempSrc:  Rectal Oral   SpO2:  96% 92% 97%  Weight: 61.2 kg     Height: 5\' 2"  (1.575 m)      Constitutional: Lethargic, but arousable. Not in any apparent distress HEENT:      Head: Normocephalic and atraumatic.         Eyes: PERLA, EOMI, Conjunctivae are normal. Sclera is non-icteric.       Mouth/Throat: Mucous membranes are moist.       Neck: Supple with no signs of meningismus. Cardiovascular: Regular rate and rhythm. No murmurs, gallops, or rubs. 2+ symmetrical distal pulses are present . No JVD. No  LE edema Respiratory: Respiratory effort normal .Lungs sounds clear bilaterally. No wheezes, crackles, or  rhonchi.  Gastrointestinal: Soft, non tender, non distended. Positive bowel sounds.  Genitourinary: No CVA tenderness. Musculoskeletal: Nontender with normal range of motion in all extremities. No cyanosis, or erythema of extremities. Neurologic:  Face is symmetric. Moving all extremities. No gross focal neurologic deficits . Skin: Skin is warm, dry.  No rash or ulcers Psychiatric: Difficult to assess due to lethargy  Labs on Admission: I have personally reviewed following labs and imaging studies  CBC: Recent Labs  Lab 05/19/21 2052  WBC 9.8  HGB 11.3*  HCT 33.9*  MCV 91.4  PLT 244   Basic Metabolic Panel: Recent Labs  Lab 05/19/21 2052  NA 128*  K 3.7  CL 98  CO2 22  GLUCOSE 131*  BUN 14  CREATININE 0.80  CALCIUM 8.9   GFR: Estimated Creatinine Clearance: 37.8 mL/min (by C-G formula based on SCr of 0.8 mg/dL). Liver Function Tests: Recent Labs  Lab 05/19/21 2052  AST 26  ALT 19  ALKPHOS 27*  BILITOT 0.6  PROT 7.2  ALBUMIN 3.8   No results for input(s): LIPASE, AMYLASE in the last 168 hours. No results for input(s): AMMONIA in the last 168 hours. Coagulation Profile: No results for input(s): INR, PROTIME in the last 168 hours. Cardiac Enzymes: No results for input(s): CKTOTAL, CKMB, CKMBINDEX, TROPONINI in the last 168 hours. BNP (last 3 results) No results for input(s): PROBNP in the last 8760 hours. HbA1C: No results for input(s): HGBA1C in the last 72 hours. CBG: Recent Labs  Lab 05/19/21 2049  GLUCAP 136*   Lipid Profile: No results for input(s): CHOL, HDL, LDLCALC, TRIG, CHOLHDL, LDLDIRECT in the last 72 hours. Thyroid Function Tests: No results for input(s): TSH, T4TOTAL, FREET4, T3FREE, THYROIDAB in the last 72 hours. Anemia Panel: No results for input(s): VITAMINB12, FOLATE, FERRITIN, TIBC, IRON, RETICCTPCT in the last 72 hours. Urine analysis:    Component Value Date/Time   COLORURINE YELLOW 05/19/2021 2150   APPEARANCEUR CLEAR  05/19/2021 2150   LABSPEC 1.020 05/19/2021 2150   PHURINE 7.0 05/19/2021 2150   GLUCOSEU NEGATIVE 05/19/2021 2150   HGBUR TRACE (A) 05/19/2021 2150   BILIRUBINUR NEGATIVE 05/19/2021 2150   BILIRUBINUR neg 02/25/2016 [place] 05/19/2021 2150   PROTEINUR 30 (A) 05/19/2021 2150   UROBILINOGEN 0.2 02/25/2016 1013   NITRITE NEGATIVE 05/19/2021 2150   LEUKOCYTESUR NEGATIVE 05/19/2021 2150    Radiological Exams on Admission: DG Chest 2 View  Result Date: 05/20/2021 CLINICAL DATA:  Cough and chills. EXAM: CHEST - 2 VIEW COMPARISON:  Chest radiograph dated 02/11/2021. FINDINGS: Minimal bibasilar atelectasis. No focal consolidation, pleural effusion, pneumothorax. The cardiac silhouette is within limits. Atherosclerotic calcification of the aorta. Osteopenia with degenerative changes of the spine. No acute osseous pathology. IMPRESSION: No active cardiopulmonary disease. Electronically Signed   By: Anner Crete M.D.   On: 05/20/2021 01:20   CT HEAD WO CONTRAST (5MM)  Result Date: 05/20/2021 CLINICAL DATA:  Generalized weakness for a few days EXAM: CT HEAD WITHOUT CONTRAST TECHNIQUE: Contiguous axial images were obtained from the base of the skull through the vertex without intravenous contrast. COMPARISON:  03/28/2021 FINDINGS: Brain: No evidence of acute infarction, hemorrhage, cerebral edema, mass, mass effect, or midline shift. No hydrocephalus or extra-axial fluid collection. Moderate to severe periventricular white matter changes, likely the sequela of chronic small vessel ischemic disease. Unchanged diffuse cerebral volume loss. Vascular: No hyperdense vessel. Skull: Normal. Negative for fracture or focal lesion. Sinuses/Orbits: Mucous retention cyst in the left maxillary sinus. Mild mucosal thickening in the ethmoid air cells. Status post bilateral lens replacements. Other: The mastoid air cells are well aerated. IMPRESSION: No acute intracranial process. Electronically  Signed   By: Merilyn Baba M.D.   On: 05/20/2021 02:32    Assessment/Plan Principal Problem:   Influenza A Active Problems:   Elevated troponin   Hyponatremia   Chronic diastolic congestive heart failure (HCC)   Paroxysmal atrial fibrillation (HCC)    Influenza A - Tamiflu - Supportive care  Acute metabolic encephalopathy - Secondary to acute infection - We will keep n.p.o. until more awake and alert - Fall and aspiration precautions  Elevated troponin - Troponin 20-33 - Suspect demand ischemia  Hyponatremia acute/subacute on chronic -Sodium 128, down from 136 on 10/22. - Received a normal saline bolus in the ED - Continue to monitor sodium - Follow urine and sodium osmolality and urine sodium     Chronic diastolic congestive heart failure (HCC) - Not acutely exacerbated - Not currently on diuretics - Continue lisinopril      Paroxysmal atrial fibrillation (HCC) - Continue Xarelto pending medrec - Not currently on rate control agent, pending med rec     Essential hypertension - Continue lisinopril     Hypothyroidism - Continue levothyroxine     CLL (chronic lymphocytic leukemia) ([place]) - Patient continues with oncology     DVT prophylaxis: Xarelto  Code Status: partial code  Family Communication:  Daughter, [name] Disposition Plan: Back to previous home environment Consults called: none   Status:At the time of admission, it appears that the appropriate admission status for this patient is INPATIENT. This is judged to be reasonable and necessary in order to provide the required intensity of service to ensure the patient's safety given the presenting symptoms, physical exam findings, and initial radiographic and laboratory data in the context of their  Comorbid conditions.   Patient requires inpatient status due to high intensity of service, high risk for further deterioration and high frequency of surveillance required.   I certify that at the point of  admission it is my clinical judgment that the patient will require inpatient hospital care spanning beyond [place] MD [facility]   05/20/2021, 4:44 AM

## 2021-05-20 NOTE — ED Notes (Signed)
Oxygen saturation 90% on RA. Oxygen placed at 2L per [place]. Patient drowsy, awakens to verbal stimuli. Oxygen increased to 95% on 2L.

## 2021-05-21 DIAGNOSIS — I5032 Chronic diastolic (congestive) heart failure: Secondary | ICD-10-CM | POA: Diagnosis not present

## 2021-05-21 DIAGNOSIS — I480 Paroxysmal atrial fibrillation: Secondary | ICD-10-CM

## 2021-05-21 DIAGNOSIS — E871 Hypo-osmolality and hyponatremia: Secondary | ICD-10-CM

## 2021-05-21 DIAGNOSIS — J9601 Acute respiratory failure with hypoxia: Secondary | ICD-10-CM

## 2021-05-21 DIAGNOSIS — J690 Pneumonitis due to inhalation of food and vomit: Secondary | ICD-10-CM | POA: Clinically undetermined

## 2021-05-21 DIAGNOSIS — J101 Influenza due to other identified influenza virus with other respiratory manifestations: Secondary | ICD-10-CM | POA: Diagnosis not present

## 2021-05-21 LAB — URINE CULTURE: Culture: <10000 — AB

## 2021-05-21 LAB — PROCALCITONIN: Procalcitonin: 0.95 ng/mL

## 2021-05-21 MED ORDER — SODIUM CHLORIDE 1 G PO TABS
1.0000 g | ORAL_TABLET | Freq: Three times a day (TID) | ORAL | Status: DC
Start: 2021-05-21 — End: 2021-05-23
  Administered 2021-05-21 – 2021-05-23 (×7): 1 g via ORAL
  Filled 2021-05-21 (×9): qty 1

## 2021-05-21 NOTE — TOC Initial Note (Signed)
Transition of Care [facility]) - Initial/Assessment Note    Patient Details  Name: Sydney Mcdaniel MRN: [id number] Date of Birth: [birth year]  Transition of [facility] CM/SW Contact:    Magnus Ivan, LCSW Phone Number: 05/21/2021, 9:20 AM  Clinical Narrative:                Spoke with daughter [name] regarding DC planning. Patient lives alone, however [name] lives close by. [name] provides transportation. PCP is Hotel manager. Pharmacy is [facility]. Patient has a RW and cane. Had [facility] in the past. No SNF history. TOC to follow for needs.   Expected Discharge Plan: Home/Self Care Barriers to Discharge: Continued Medical Work up   Patient Goals and CMS Choice Patient states their goals for this hospitalization and ongoing recovery are:: to return home CMS Medicare.gov Compare Post Acute Care list provided to:: Patient Represenative (must comment) Choice offered to / list presented to : Adult Children  Expected Discharge Plan and Services Expected Discharge Plan: Home/Self Care       Living arrangements for the past 2 months: Single Family Home                                      Prior Living Arrangements/Services Living arrangements for the past 2 months: Single Family Home Lives with:: Self Patient language and need for interpreter reviewed:: Yes Do you feel safe going back to the place where you live?: Yes      Need for Family Participation in Patient Care: Yes (Comment) Care giver support system in place?: Yes (comment) Current home services: DME Criminal Activity/Legal Involvement Pertinent to Current Situation/Hospitalization: No - Comment as needed  Activities of Daily Living Home Assistive Devices/Equipment: Blood pressure cuff, Wheelchair, Walker (specify type), Raised toilet seat with rails, Bedside commode/3-in-1 ADL Screening (condition at time of admission) Patient's cognitive ability adequate to safely complete daily  activities?: Yes Is the patient deaf or have difficulty hearing?: No Does the patient have difficulty seeing, even when wearing glasses/contacts?: Yes Does the patient have difficulty concentrating, remembering, or making decisions?: Yes Patient able to express need for assistance with ADLs?: Yes Does the patient have difficulty dressing or bathing?: No Independently performs ADLs?: Yes (appropriate for developmental age) Does the patient have difficulty walking or climbing stairs?: Yes Weakness of Legs: None Weakness of Arms/Hands: None  Permission Sought/Granted Permission sought to share information with : Chartered certified accountant granted to share information with : Yes, Verbal Permission Granted (by daughter)     Permission granted to share info w AGENCY: [place], DME agencies if needed        Emotional Assessment         Alcohol / Substance Use: Not Applicable Psych Involvement: No (comment)  Admission diagnosis:  Hyponatremia [E87.1] Influenza A [J10.1] Hypoxia [R09.02] Acute respiratory failure with hypoxia (HCC) [J96.01] Generalized weakness [R53.1] Fall, initial encounter [W19.XXXA] Sepsis with hypotension ([place]) [A41.9, I95.9] Fever, unspecified fever cause [R50.9] Altered mental status, unspecified altered mental status type [R41.82] Cough, unspecified type [R05.9] Patient Active Problem List   Diagnosis Date Noted   Influenza A 05/20/2021   Elevated troponin 05/20/2021   Fall 02/11/2021   Normocytic anemia 05/31/2020   Acute colitis 04/11/2020   GI bleeding 04/11/2020   Iron deficiency anemia 03/08/2020   Goals of care, counseling/discussion 03/08/2020   Positive direct antiglobulin test (DAT) 12/04/2019  Sinus bradycardia 04/09/2019   First degree AV block 32/99/2426   Acute metabolic encephalopathy 83/41/9622   Osteoporosis 08/21/2018   Osteoarthritis of left hip 05/21/2018   Vasovagal syncope 04/19/2018   Bilateral carotid artery  stenosis 04/19/2018   History of syncope 10/17/2017   MCI (mild cognitive impairment) 08/12/2017   Lung nodules 07/09/2017   Paroxysmal atrial fibrillation ([place]) 04/18/2017   Pleural effusion 10/20/2016   Hyponatremia 10/20/2016   Chronic diastolic congestive heart failure ([place]) 06/28/2016   Primary open-angle glaucoma, bilateral, mild stage 05/08/2016   Labile blood glucose 04/27/2016   Bladder prolapse, female, acquired 02/25/2016   Asymptomatic bacteriuria 02/25/2016   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   B12 deficiency 01/11/2016   Anemia 01/10/2016   CLL (chronic lymphocytic leukemia) ([place]) 01/10/2016   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 12/21/2015   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 12/21/2015   Bradycardia 12/21/2015   Lymphocytosis 06/25/2015   Impaired fasting glucose 06/22/2015   Carotid atherosclerosis 06/22/2015   Essential hypertension 12/22/2014   Hyperlipidemia 12/22/2014   Hypothyroidism 12/22/2014   PCP:  [place]:   [facility] [address]), [place] [address] [place] ([place])  [address] Phone: [phone] Fax: [facility] [employer] [place], [place] [address] Phone: [phone] Fax: [phone]     Social Determinants of Health (SDOH) Interventions    Readmission Risk Interventions Readmission Risk Prevention Plan 05/21/2021 02/12/2021  Transportation Screening Complete Complete  PCP or Specialist Appt within 3-5 Days Complete Complete  HRI or Home Care Consult Complete Complete  Social Work Consult for [place] Planning/Counseling Complete Not Complete  SW consult not completed comments - RN Case Manager assigned to patient.  Palliative Care Screening Not Applicable Not Applicable  Medication Review (RN Care Manager) Complete Complete   Some recent data might be hidden

## 2021-05-21 NOTE — Progress Notes (Addendum)
Progress Note    HOLIDAY MCMENAMIN  RSW:[id number] DOB: [birth year]  DOA: 05/20/2021 PCP: [facility]      Brief Narrative:    Medical records reviewed and are as summarized below:  Sydney Mcdaniel is a 87 y.o. female with medical history significant for paroxysmal atrial fibrillation on Xarelto, hypothyroidism, glucoma, CLL, hypertension, chronic hyponatremia, who presented to the hospital with cough, generalized weakness and lethargy.      Assessment/Plan:   Principal Problem:   Influenza A Active Problems:   Chronic diastolic congestive heart failure (HCC)   Hyponatremia   Paroxysmal atrial fibrillation (HCC)   Elevated troponin   Aspiration pneumonia ([place])   Acute hypoxemic respiratory failure (HCC)   Body mass index is 26.73 kg/m.  Influenza A infection: Continue Tamiflu.  Antitussives as needed.  Aspiration pneumonia: Continue IV Unasyn.  Acute hypoxemic respiratory failure: She is requiring 4 L/min oxygen via nasal cannula.  Mildly elevated troponin: This likely from demand ischemia.  No chest pain.  Acute on chronic hyponatremia: Sodium level is trending down.  Start salt tablets.  Monitor BMP.  Paroxysmal atrial fibrillation: Continue Xarelto  Other comorbidities include chronic diastolic CHF, hypertension, CLL, hypothyroidism   Diet Order             Diet Heart Room service appropriate? Yes; Fluid consistency: Thin  Diet effective now                      Consultants: None  Procedures: None    Medications:    amLODipine  5 mg Oral Daily   ferrous sulfate  325 mg Oral Daily   latanoprost  1 drop Right Eye QHS   [START ON 05/22/2021] levothyroxine  75 mcg Oral Once per day on Sun Mon Wed Fri   levothyroxine  88 mcg Oral Once per day on Tue Thu Sat   lisinopril  20 mg Oral Daily   mouth rinse  15 mL Mouth Rinse BID   Netarsudil Dimesylate  1 drop Right Eye QHS   oseltamivir  30 mg Oral BID    polyethylene glycol  17 g Oral Daily   Rivaroxaban  15 mg Oral Q supper   sodium chloride  1 g Oral TID WC   timolol  1 drop Right Eye BID   vitamin B-12  100 mcg Oral Daily   Continuous Infusions:  ampicillin-sulbactam (UNASYN) IV 3 g (05/21/21 0921)     Anti-infectives (From admission, onward)    Start     Dose/Rate Route Frequency Ordered Stop   05/20/21 2330  oseltamivir (TAMIFLU) capsule 30 mg        30 mg Oral 2 times daily 05/20/21 2242 05/25/21 0959   05/20/21 2200  oseltamivir (TAMIFLU) 6 MG/ML suspension 75 mg  Status:  Discontinued        75 mg Oral 2 times daily 05/20/21 0807 05/20/21 0812   05/20/21 2200  oseltamivir (TAMIFLU) 6 MG/ML suspension 30 mg  Status:  Discontinued        30 mg Oral 2 times daily 05/20/21 0813 05/20/21 2241   05/20/21 1645  Ampicillin-Sulbactam (UNASYN) 3 g in sodium chloride 0.9 % 100 mL IVPB        3 g 200 mL/hr over 30 Minutes Intravenous Every 6 hours 05/20/21 1638     05/20/21 0345  oseltamivir (TAMIFLU) 6 MG/ML suspension 75 mg        75 mg Oral  Once  05/20/21 0340 05/20/21 0448              Family Communication/Anticipated D/C date and plan/Code Status   DVT prophylaxis:  Rivaroxaban (XARELTO) tablet 15 mg     Code Status: Partial Code  Family Communication: Plan discussed with her daughter at the bedside Disposition Plan: Possible discharge to home in 2 to 3 days   Status is: Inpatient  Remains inpatient appropriate because: IV antibiotics           Subjective:   Interval events noted.  She had a coughing spell this morning.  Breathing is a little better.  Her daughter was at the bedside  Objective:    Vitals:   05/21/21 0924 05/21/21 1119 05/21/21 1120 05/21/21 1134  BP:    (!) 123/59  Pulse:    77  Resp:    16  Temp:    (!) 97.5 F (36.4 C)  TempSrc:      SpO2: 98% 98% 97% 100%  Weight:      Height:       No data found.   Intake/Output Summary (Last 24 hours) at 05/21/2021 1333 Last  data filed at 05/21/2021 0611 Gross per 24 hour  Intake 300 ml  Output 1050 ml  Net -750 ml   Filed Weights   05/19/21 2032 05/20/21 2044  Weight: 61.2 kg 66.3 kg    Exam:  GEN: NAD SKIN: Warm and dry EYES: No pallor or icterus ENT: MMM CV: RRR PULM: Air entry adequate bilaterally.  Right basilar rales.  No wheezing ABD: soft, ND, NT, +BS CNS: AAO x 3, non focal EXT: No edema or tenderness        Data Reviewed:   I have personally reviewed following labs and imaging studies:  Labs: Labs show the following:   Basic Metabolic Panel: Recent Labs  Lab 05/19/21 2052 05/20/21 0519 05/20/21 1502  NA 128*  --  127*  K 3.7  --  3.6  CL 98  --  100  CO2 22  --  22  GLUCOSE 131*  --  138*  BUN 14  --  11  CREATININE 0.80 0.73 0.72  CALCIUM 8.9  --  7.8*   GFR Estimated Creatinine Clearance: 39.3 mL/min (by C-G formula based on SCr of 0.72 mg/dL). Liver Function Tests: Recent Labs  Lab 05/19/21 2052  AST 26  ALT 19  ALKPHOS 27*  BILITOT 0.6  PROT 7.2  ALBUMIN 3.8   No results for input(s): LIPASE, AMYLASE in the last 168 hours. No results for input(s): AMMONIA in the last 168 hours. Coagulation profile No results for input(s): INR, PROTIME in the last 168 hours.  CBC: Recent Labs  Lab 05/19/21 2052 05/20/21 0519  WBC 9.8 10.4  HGB 11.3* 8.9*  HCT 33.9* 26.3*  MCV 91.4 90.7  PLT 163 140*   Cardiac Enzymes: No results for input(s): CKTOTAL, CKMB, CKMBINDEX, TROPONINI in the last 168 hours. BNP (last 3 results) No results for input(s): PROBNP in the last 8760 hours. CBG: Recent Labs  Lab 05/19/21 2049  GLUCAP 136*   D-Dimer: No results for input(s): DDIMER in the last 72 hours. Hgb A1c: No results for input(s): HGBA1C in the last 72 hours. Lipid Profile: No results for input(s): CHOL, HDL, LDLCALC, TRIG, CHOLHDL, LDLDIRECT in the last 72 hours. Thyroid function studies: No results for input(s): TSH, T4TOTAL, T3FREE, THYROIDAB in the  last 72 hours.  Invalid input(s): FREET3 Anemia work up: No results for input(s):  VITAMINB12, FOLATE, FERRITIN, TIBC, IRON, RETICCTPCT in the last 72 hours. Sepsis Labs: Recent Labs  Lab 05/19/21 2052 05/20/21 0104 05/20/21 0519 05/20/21 1502 05/21/21 0616  PROCALCITON <0.10  --   --  0.20 0.95  WBC 9.8  --  10.4  --   --   LATICACIDVEN  --  1.5  --  2.7*  --     Microbiology Recent Results (from the past 240 hour(s))  Urine Culture     Status: Abnormal   Collection Time: 05/19/21  9:50 PM   Specimen: Urine, Random  Result Value Ref Range Status   Specimen Description   Final    URINE, RANDOM Performed at [facility], [address] [place], [place] [address]    Special Requests   Final    NONE Performed at [facility], [address], [place], [place] [address]    Culture (A)  Final    <10,000 COLONIES/mL INSIGNIFICANT GROWTH Performed at [facility], [place] [address] [place], [place] [address]    Report Status 05/21/2021 FINAL  Final  Resp Panel by RT-PCR (Flu A&B, Covid) Nasopharyngeal Swab     Status: Abnormal   Collection Time: 05/20/21  1:04 AM   Specimen: Nasopharyngeal Swab; Nasopharyngeal(NP) swabs in vial transport medium  Result Value Ref Range Status   SARS Coronavirus 2 by RT PCR NEGATIVE NEGATIVE Final    Comment: (NOTE) SARS-CoV-2 target nucleic acids are NOT DETECTED.  The SARS-CoV-2 RNA is generally detectable in upper respiratory specimens during the acute phase of infection. The lowest concentration of SARS-CoV-2 viral copies this assay can detect is 138 copies/mL. A negative result does not preclude SARS-Cov-2 infection and should not be used as the sole basis for treatment or other patient management decisions. A negative result may occur with  improper specimen collection/handling, submission of specimen other than nasopharyngeal swab, presence of viral mutation(s) within the areas targeted by this assay, and  inadequate number of viral copies(<138 copies/mL). A negative result must be combined with clinical observations, patient history, and epidemiological information. The expected result is Negative.  Fact Sheet for Patients:  EntrepreneurPulse.com.au  Fact Sheet for Healthcare Providers:  IncredibleEmployment.be  This test is no t yet approved or cleared by the [place] FDA and  has been authorized for detection and/or diagnosis of SARS-CoV-2 by FDA under an Emergency Use Authorization (EUA). This EUA will remain  in effect (meaning this test can be used) for the duration of the COVID-19 declaration under Section 564(b)(1) of the Act, 21 U.S.C.section 360bbb-3(b)(1), unless the authorization is terminated  or revoked sooner.       Influenza A by PCR POSITIVE (A) NEGATIVE Final   Influenza B by PCR NEGATIVE NEGATIVE Final    Comment: (NOTE) The Xpert Xpress SARS-CoV-2/FLU/RSV plus assay is intended as an aid in the diagnosis of influenza from Nasopharyngeal swab specimens and should not be used as a sole basis for treatment. Nasal washings and aspirates are unacceptable for Xpert Xpress SARS-CoV-2/FLU/RSV testing.  Fact Sheet for Patients: EntrepreneurPulse.com.au  Fact Sheet for Healthcare Providers: IncredibleEmployment.be  This test is not yet approved or cleared by the [place] FDA and has been authorized for detection and/or diagnosis of SARS-CoV-2 by FDA under an Emergency Use Authorization (EUA). This EUA will remain in effect (meaning this test can be used) for the duration of the COVID-19 declaration under Section 564(b)(1) of the Act, 21 U.S.C. section 360bbb-3(b)(1), unless the authorization is terminated or revoked.  Performed at [facility], [address] [place], [place] [address]   Respiratory (~20 pathogens) panel by PCR     Status: Abnormal   Collection Time:  05/20/21  1:04 AM   Specimen: Nasopharyngeal Swab; Respiratory  Result Value Ref Range Status   Adenovirus NOT DETECTED NOT DETECTED Final   Coronavirus 229E NOT DETECTED NOT DETECTED Final    Comment: (NOTE) The Coronavirus on the Respiratory Panel, DOES NOT test for the novel  Coronavirus (2019 nCoV)    Coronavirus HKU1 NOT DETECTED NOT DETECTED Final   Coronavirus NL63 NOT DETECTED NOT DETECTED Final   Coronavirus OC43 NOT DETECTED NOT DETECTED Final   Metapneumovirus NOT DETECTED NOT DETECTED Final   Rhinovirus / Enterovirus NOT DETECTED NOT DETECTED Final   Influenza A H3 DETECTED (A) NOT DETECTED Final   Influenza B NOT DETECTED NOT DETECTED Final   Parainfluenza Virus 1 NOT DETECTED NOT DETECTED Final   Parainfluenza Virus 2 NOT DETECTED NOT DETECTED Final   Parainfluenza Virus 3 NOT DETECTED NOT DETECTED Final   Parainfluenza Virus 4 NOT DETECTED NOT DETECTED Final   Respiratory Syncytial Virus NOT DETECTED NOT DETECTED Final   Bordetella pertussis NOT DETECTED NOT DETECTED Final   Bordetella Parapertussis NOT DETECTED NOT DETECTED Final   Chlamydophila pneumoniae NOT DETECTED NOT DETECTED Final   Mycoplasma pneumoniae NOT DETECTED NOT DETECTED Final    Comment: Performed at [facility], [address] [place], [place] [address]  Culture, blood (Routine X 2) w Reflex to ID Panel     Status: None (Preliminary result)   Collection Time: 05/20/21  1:41 AM   Specimen: BLOOD  Result Value Ref Range Status   Specimen Description BLOOD RIGHT [facility]  Final   Special Requests   Final    BOTTLES DRAWN AEROBIC AND ANAEROBIC Blood Culture adequate volume   Culture   Final    NO GROWTH 1 DAY Performed at [facility], [address], [place], [place] [address]    Report Status PENDING  Incomplete  Culture, blood (Routine X 2) w Reflex to ID Panel     Status: None (Preliminary result)   Collection Time: 05/20/21  1:57 AM   Specimen: BLOOD  Result Value Ref Range  Status   Specimen Description BLOOD LEFT [facility]  Final   Special Requests   Final    BOTTLES DRAWN AEROBIC AND ANAEROBIC Blood Culture adequate volume   Culture   Final    NO GROWTH 1 DAY Performed at [facility], [address], [place], [place] [address]    Report Status PENDING  Incomplete    Procedures and diagnostic studies:  DG Chest 2 View  Result Date: 05/20/2021 CLINICAL DATA:  Cough and chills. EXAM: CHEST - 2 VIEW COMPARISON:  Chest radiograph dated 02/11/2021. FINDINGS: Minimal bibasilar atelectasis. No focal consolidation, pleural effusion, pneumothorax. The cardiac silhouette is within limits. Atherosclerotic calcification of the aorta. Osteopenia with degenerative changes of the spine. No acute osseous pathology. IMPRESSION: No active cardiopulmonary disease. Electronically Signed   By: Anner Crete M.D.   On: 05/20/2021 01:20   CT HEAD WO CONTRAST (5MM)  Result Date: 05/20/2021 CLINICAL DATA:  Generalized weakness for a few days EXAM: CT HEAD WITHOUT CONTRAST TECHNIQUE: Contiguous axial images were obtained from the base of the skull through the vertex without intravenous contrast. COMPARISON:  03/28/2021 FINDINGS: Brain: No evidence of acute infarction, hemorrhage, cerebral edema, mass, mass effect, or midline shift. No hydrocephalus or extra-axial  fluid collection. Moderate to severe periventricular white matter changes, likely the sequela of chronic small vessel ischemic disease. Unchanged diffuse cerebral volume loss. Vascular: No hyperdense vessel. Skull: Normal. Negative for fracture or focal lesion. Sinuses/Orbits: Mucous retention cyst in the left maxillary sinus. Mild mucosal thickening in the ethmoid air cells. Status post bilateral lens replacements. Other: The mastoid air cells are well aerated. IMPRESSION: No acute intracranial process. Electronically Signed   By: Merilyn Baba M.D.   On: 05/20/2021 02:32   CT Angio Chest Pulmonary Embolism (PE) W or WO  Contrast  Result Date: 05/20/2021 CLINICAL DATA:  Hypoxia EXAM: CT ANGIOGRAPHY CHEST WITH CONTRAST TECHNIQUE: Multidetector CT imaging of the chest was performed using the standard protocol during bolus administration of intravenous contrast. Multiplanar CT image reconstructions and MIPs were obtained to evaluate the vascular anatomy. CONTRAST:  64mL OMNIPAQUE IOHEXOL 350 MG/ML SOLN COMPARISON:  Chest CT dated February 11, 2021 FINDINGS: Cardiovascular: Adequate contrast opacification of the pulmonary arteries. No evidence of pulmonary embolus. Normal heart size. No pericardial effusion. Three-vessel coronary artery calcifications. Atherosclerotic disease of the thoracic aorta. Mediastinum/Nodes: Small hiatal hernia. Patulous esophagus. Thyroid is unremarkable. Mildly enlarged mediastinal lymph nodes, likely reactive. Reference subcarinal lymph node measuring 1.4 cm on image 130. Lungs/Pleura: Debris seen in the right lower lobe bronchus and left mainstem bronchus with occlusion of some of the segmental/subsegmental lower lobe bronchi. Right greater than lower lobe consolidations. There is some chest Upper Abdomen: Cholelithiasis.  No acute abnormality. Musculoskeletal: No chest wall abnormality. No acute or significant osseous findings. Review of the MIP images confirms the above findings. IMPRESSION: 1. No evidence of pulmonary embolus. 2. Debris seen in the right lower lobe bronchus and left mainstem bronchus with occlusion of some of the segmental/subsegmental lower lobe bronchi and right greater than left lower lobe consolidations, findings are favored to be due to aspiration pneumonia. 3. Patulous esophagus, findings can be seen in the setting of esophageal dysmotility and places patient at risk for aspiration. 4. Three-vessel coronary artery calcifications. 5.  Aortic Atherosclerosis (ICD10-I70.0). Electronically Signed   By: Yetta Glassman M.D.   On: 05/20/2021 15:57   DG Chest Port 1 View  Result  Date: 05/20/2021 CLINICAL DATA:  Submitted images demonstrate duodenal scope positioned over the proximal duodenum. No cholangiography is performed. EXAM: PORTABLE CHEST - 1 VIEW COMPARISON:  Earlier the same day and 02/11/2021 FINDINGS: The mediastinal contours are within normal limits. No cardiomegaly. Atherosclerotic calcification of the aortic arch. The lungs are clear bilaterally without evidence of focal consolidation, pleural effusion, or pneumothorax. No acute osseous abnormality. IMPRESSION: No acute cardiopulmonary process. Aortic Atherosclerosis (ICD10-I70.0). Electronically Signed   By: Ruthann Cancer M.D.   On: 05/20/2021 12:18               LOS: 1 day   Linnell Swords  [facility]   Pager on [email or web address]. If 7PM-7AM, please contact night-coverage at [email or web address]     05/21/2021, 1:33 PM

## 2021-05-22 ENCOUNTER — Encounter: Payer: Self-pay | Admitting: Hematology and Oncology

## 2021-05-22 DIAGNOSIS — J9601 Acute respiratory failure with hypoxia: Secondary | ICD-10-CM | POA: Diagnosis not present

## 2021-05-22 DIAGNOSIS — E871 Hypo-osmolality and hyponatremia: Secondary | ICD-10-CM | POA: Diagnosis not present

## 2021-05-22 DIAGNOSIS — J690 Pneumonitis due to inhalation of food and vomit: Secondary | ICD-10-CM | POA: Diagnosis not present

## 2021-05-22 DIAGNOSIS — J101 Influenza due to other identified influenza virus with other respiratory manifestations: Secondary | ICD-10-CM | POA: Diagnosis not present

## 2021-05-22 LAB — CBC
HCT: 26.4 % — ABNORMAL LOW (ref 36.0–46.0)
Hemoglobin: 8.9 g/dL — ABNORMAL LOW (ref 12.0–15.0)
MCH: 30.4 pg (ref 26.0–34.0)
MCHC: 33.7 g/dL (ref 30.0–36.0)
MCV: 90.1 fL (ref 80.0–100.0)
Platelets: 120 10*3/uL — ABNORMAL LOW (ref 150–400)
RBC: 2.93 MIL/uL — ABNORMAL LOW (ref 3.87–5.11)
RDW: 15.5 % (ref 11.5–15.5)
WBC: 8.3 10*3/uL (ref 4.0–10.5)
nRBC: 0 % (ref 0.0–0.2)

## 2021-05-22 LAB — PROCALCITONIN: Procalcitonin: 0.61 ng/mL

## 2021-05-22 LAB — SODIUM: Sodium: 132 mmol/L — ABNORMAL LOW (ref 135–145)

## 2021-05-22 LAB — GLUCOSE, CAPILLARY: Glucose-Capillary: 124 mg/dL — ABNORMAL HIGH (ref 70–99)

## 2021-05-22 MED ORDER — AMOXICILLIN-POT CLAVULANATE 875-125 MG PO TABS
1.0000 | ORAL_TABLET | Freq: Two times a day (BID) | ORAL | Status: DC
  Administered 2021-05-22 – 2021-05-23 (×2): 1 via ORAL
  Filled 2021-05-22 (×2): qty 1

## 2021-05-22 MED ORDER — OSELTAMIVIR PHOSPHATE 75 MG PO CAPS: 75.0000 mg | ORAL_CAPSULE | Freq: Two times a day (BID) | ORAL | 0 refills | Status: AC

## 2021-05-22 MED ORDER — OSELTAMIVIR PHOSPHATE 75 MG PO CAPS: 75.0000 mg | ORAL_CAPSULE | Freq: Two times a day (BID) | ORAL | Status: DC

## 2021-05-22 MED ORDER — RIVAROXABAN 15 MG PO TABS: 15.0000 mg | ORAL_TABLET | Freq: Every day | ORAL | Status: DC

## 2021-05-22 MED ORDER — OSELTAMIVIR PHOSPHATE 30 MG PO CAPS
30.0000 mg | ORAL_CAPSULE | Freq: Two times a day (BID) | ORAL | Status: DC
  Administered 2021-05-22 – 2021-05-23 (×2): 30 mg via ORAL
  Filled 2021-05-22 (×3): qty 1

## 2021-05-22 MED ORDER — AMOXICILLIN-POT CLAVULANATE 875-125 MG PO TABS: 1.0000 | ORAL_TABLET | Freq: Two times a day (BID) | ORAL | 0 refills | Status: AC

## 2021-05-22 MED ORDER — FERROUS SULFATE 325 (65 FE) MG PO TABS: 325.0000 mg | ORAL_TABLET | Freq: Every day | ORAL | 3 refills | Status: DC

## 2021-05-22 NOTE — Progress Notes (Signed)
Patient became light headed when she started getting dressed. Per daughter she had not tried to stand up yet, but looked "glassy eyed like she was going to pass out".  VSS charted. MD notified. Will check orthostatics and keep patient overnight.

## 2021-05-22 NOTE — TOC Transition Note (Signed)
Transition of Care [facility]) - CM/SW Discharge Note   Patient Details  Name: Sydney Mcdaniel MRN: [id number] Date of Birth: 03-Jul-1924  [facility]) CM/SW Contact:  Magnus Ivan, LCSW Phone Number: 05/22/2021, 2:58 PM   Clinical Narrative:    Patient to DC home today. HH referral to [place]. He is aware of DC today.   Final next level of care: [place] Barriers to Discharge: Barriers Resolved   Patient Goals and CMS Choice Patient states their goals for this hospitalization and ongoing recovery are:: to return home CMS Medicare.gov Compare Post Acute Care list provided to:: Patient Represenative (must comment) Choice offered to / list presented to : Adult Children  Discharge Placement                       Discharge Plan and Services                          HH Arranged: PT, OT [place] Agency: [place] ([facility]) Date [place]: 05/22/21   Representative spoke with at [place]: [place] ([place]) Interventions     Readmission Risk Interventions Readmission Risk Prevention Plan 05/21/2021 02/12/2021  Transportation Screening Complete Complete  PCP or Specialist Appt within 3-5 Days Complete Complete  HRI or [place] Complete Complete  Social Work Consult for [place] Planning/Counseling Complete Not Complete  SW consult not completed comments - RN Case Manager assigned to patient.  Palliative Care Screening Not Applicable Not Applicable  Medication Review (RN Care Manager) Complete Complete  Some recent data might be hidden

## 2021-05-22 NOTE — Progress Notes (Addendum)
Progress Note    Sydney Mcdaniel  IRW:[id number] DOB: 10-23-1924  DOA: 05/20/2021 PCP: [facility]      Brief Narrative:    Medical records reviewed and are as summarized below:  Sydney Mcdaniel is a 87 y.o. female with medical history significant for paroxysmal atrial fibrillation on Xarelto, hypothyroidism, glucoma, CLL, hypertension, chronic hyponatremia, who presented to the hospital with cough, generalized weakness and lethargy.      Assessment/Plan:   Principal Problem:   Influenza A Active Problems:   Chronic diastolic congestive heart failure (HCC)   Hyponatremia   Paroxysmal atrial fibrillation (HCC)   Elevated troponin   Aspiration pneumonia ([place])   Acute hypoxemic respiratory failure (HCC)   Body mass index is 26.73 kg/m.  Influenza A infection: Continue Tamiflu.  Antitussives as needed.  Aspiration pneumonia: Change IV Unasyn to Augmentin.  Acute hypoxemic respiratory failure: Resolved.  She is tolerating room air.  Lightheadedness/near syncope: Check orthostatic vital signs and hydrate with IV fluids as needed.  Mildly elevated troponin: This likely from demand ischemia.  No chest pain.  Acute on chronic hyponatremia: Improved.  Paroxysmal atrial fibrillation: Continue Xarelto  Other comorbidities include chronic diastolic CHF, hypertension, CLL, hypothyroidism  Patient was discharged earlier today.  However, she complained of lightheadedness and felt like she was about to pass out while she was stressed dressing up for discharge.  Discharge has been canceled and she will be monitored overnight.    Diet Order             Diet - low sodium heart healthy           Diet Heart Room service appropriate? Yes; Fluid consistency: Thin  Diet effective now                      Consultants: None  Procedures: None    Medications:    amLODipine  5 mg Oral Daily   ferrous sulfate  325 mg Oral Daily    latanoprost  1 drop Right Eye QHS   levothyroxine  75 mcg Oral Once per day on Sun Mon Wed Fri   levothyroxine  88 mcg Oral Once per day on Tue Thu Sat   lisinopril  20 mg Oral Daily   mouth rinse  15 mL Mouth Rinse BID   oseltamivir  30 mg Oral BID   polyethylene glycol  17 g Oral Daily   Rivaroxaban  15 mg Oral Q supper   sodium chloride  1 g Oral TID WC   timolol  1 drop Right Eye BID   vitamin B-12  100 mcg Oral Daily   Continuous Infusions:  ampicillin-sulbactam (UNASYN) IV 3 g (05/22/21 0859)     Anti-infectives (From admission, onward)    Start     Dose/Rate Route Frequency Ordered Stop   05/22/21 0000  oseltamivir (TAMIFLU) 75 MG capsule        75 mg Oral 2 times daily 05/22/21 1409 05/24/21 2359   05/22/21 0000  amoxicillin-clavulanate (AUGMENTIN) 875-125 MG tablet        1 tablet Oral 2 times daily 05/22/21 1409 05/26/21 2359   05/20/21 2330  oseltamivir (TAMIFLU) capsule 30 mg        30 mg Oral 2 times daily 05/20/21 2242 05/25/21 0959   05/20/21 2200  oseltamivir (TAMIFLU) 6 MG/ML suspension 75 mg  Status:  Discontinued        75 mg Oral 2 times  daily 05/20/21 0807 05/20/21 0812   05/20/21 2200  oseltamivir (TAMIFLU) 6 MG/ML suspension 30 mg  Status:  Discontinued        30 mg Oral 2 times daily 05/20/21 0813 05/20/21 2241   05/20/21 1645  Ampicillin-Sulbactam (UNASYN) 3 g in sodium chloride 0.9 % 100 mL IVPB        3 g 200 mL/hr over 30 Minutes Intravenous Every 6 hours 05/20/21 1638     05/20/21 0345  oseltamivir (TAMIFLU) 6 MG/ML suspension 75 mg        75 mg Oral  Once 05/20/21 0340 05/20/21 0448              Family Communication/Anticipated D/C date and plan/Code Status   DVT prophylaxis:  Rivaroxaban (XARELTO) tablet 15 mg     Code Status: Partial Code  Family Communication: Plan discussed with her daughter at the bedside Disposition Plan: Possible discharge to home in 2 to 3 days   Status is: Inpatient  Remains inpatient appropriate  because: IV antibiotics            Subjective:   Interval events noted.  She reported feeling better earlier today.  Cough and shortness of breath have improved.  She was able to ambulate with PT.  However, later in the day when she was getting dressed for discharge, she complained of lightheadedness and felt as though she was about to pass out.  Objective:    Vitals:   05/22/21 1136 05/22/21 1420 05/22/21 1501 05/22/21 1559  BP: (!) 125/51  (!) 130/49 (!) 120/55  Pulse: 68  71 77  Resp: 20  20 20   Temp: 98.1 F (36.7 C)  98.4 F (36.9 C)   TempSrc:      SpO2: 95% 92% 91% 95%  Weight:      Height:       No data found.   Intake/Output Summary (Last 24 hours) at 05/22/2021 1614 Last data filed at 05/22/2021 1049 Gross per 24 hour  Intake 960 ml  Output 600 ml  Net 360 ml   Filed Weights   05/19/21 2032 05/20/21 2044  Weight: 61.2 kg 66.3 kg    Exam:  GEN: NAD SKIN: Warm and dry EYES: EOMI ENT: MMM CV: RRR PULM: Mild right basilar rales.  No wheezing heard.  Air entry adequate bilaterally ABD: soft, ND, NT, +BS CNS: AAO x 3, non focal EXT: No edema or tenderness          Data Reviewed:   I have personally reviewed following labs and imaging studies:  Labs: Labs show the following:   Basic Metabolic Panel: Recent Labs  Lab 05/19/21 2052 05/20/21 0519 05/20/21 1502 05/22/21 0606  NA 128*  --  127* 132*  K 3.7  --  3.6  --   CL 98  --  100  --   CO2 22  --  22  --   GLUCOSE 131*  --  138*  --   BUN 14  --  11  --   CREATININE 0.80 0.73 0.72  --   CALCIUM 8.9  --  7.8*  --    GFR Estimated Creatinine Clearance: 39.3 mL/min (by C-G formula based on SCr of 0.72 mg/dL). Liver Function Tests: Recent Labs  Lab 05/19/21 2052  AST 26  ALT 19  ALKPHOS 27*  BILITOT 0.6  PROT 7.2  ALBUMIN 3.8   No results for input(s): LIPASE, AMYLASE in the last 168 hours. No results for input(s):  AMMONIA in the last 168 hours. Coagulation  profile No results for input(s): INR, PROTIME in the last 168 hours.  CBC: Recent Labs  Lab 05/19/21 2052 05/20/21 0519 05/22/21 0606  WBC 9.8 10.4 8.3  HGB 11.3* 8.9* 8.9*  HCT 33.9* 26.3* 26.4*  MCV 91.4 90.7 90.1  PLT 163 140* 120*   Cardiac Enzymes: No results for input(s): CKTOTAL, CKMB, CKMBINDEX, TROPONINI in the last 168 hours. BNP (last 3 results) No results for input(s): PROBNP in the last 8760 hours. CBG: Recent Labs  Lab 05/19/21 2049 05/22/21 1611  GLUCAP 136* 124*   D-Dimer: No results for input(s): DDIMER in the last 72 hours. Hgb A1c: No results for input(s): HGBA1C in the last 72 hours. Lipid Profile: No results for input(s): CHOL, HDL, LDLCALC, TRIG, CHOLHDL, LDLDIRECT in the last 72 hours. Thyroid function studies: No results for input(s): TSH, T4TOTAL, T3FREE, THYROIDAB in the last 72 hours.  Invalid input(s): FREET3 Anemia work up: No results for input(s): VITAMINB12, FOLATE, FERRITIN, TIBC, IRON, RETICCTPCT in the last 72 hours. Sepsis Labs: Recent Labs  Lab 05/19/21 2052 05/20/21 0104 05/20/21 0519 05/20/21 1502 05/21/21 0616 05/22/21 0606  PROCALCITON <0.10  --   --  0.20 0.95 0.61  WBC 9.8  --  10.4  --   --  8.3  LATICACIDVEN  --  1.5  --  2.7*  --   --     Microbiology Recent Results (from the past 240 hour(s))  Urine Culture     Status: Abnormal   Collection Time: 05/19/21  9:50 PM   Specimen: Urine, Random  Result Value Ref Range Status   Specimen Description   Final    URINE, RANDOM Performed at [facility], [address] [place], [place] [address]    Special Requests   Final    NONE Performed at [facility], [address], [place], [place] [address]    Culture (A)  Final    <10,000 COLONIES/mL INSIGNIFICANT GROWTH Performed at [facility], [place] [address], [place], [place] [address]    Report Status 05/21/2021 FINAL  Final  Resp Panel by RT-PCR (Flu A&B, Covid) Nasopharyngeal Swab      Status: Abnormal   Collection Time: 05/20/21  1:04 AM   Specimen: Nasopharyngeal Swab; Nasopharyngeal(NP) swabs in vial transport medium  Result Value Ref Range Status   SARS Coronavirus 2 by RT PCR NEGATIVE NEGATIVE Final    Comment: (NOTE) SARS-CoV-2 target nucleic acids are NOT DETECTED.  The SARS-CoV-2 RNA is generally detectable in upper respiratory specimens during the acute phase of infection. The lowest concentration of SARS-CoV-2 viral copies this assay can detect is 138 copies/mL. A negative result does not preclude SARS-Cov-2 infection and should not be used as the sole basis for treatment or other patient management decisions. A negative result may occur with  improper specimen collection/handling, submission of specimen other than nasopharyngeal swab, presence of viral mutation(s) within the areas targeted by this assay, and inadequate number of viral copies(<138 copies/mL). A negative result must be combined with clinical observations, patient history, and epidemiological information. The expected result is Negative.  Fact Sheet for Patients:  EntrepreneurPulse.com.au  Fact Sheet for Healthcare Providers:  IncredibleEmployment.be  This test is no t yet approved or cleared by the [place] FDA and  has been authorized for detection and/or diagnosis of SARS-CoV-2 by FDA under an Emergency Use Authorization (EUA). This EUA will remain  in effect (meaning this test can be used)  for the duration of the COVID-19 declaration under Section 564(b)(1) of the Act, 21 U.S.C.section 360bbb-3(b)(1), unless the authorization is terminated  or revoked sooner.       Influenza A by PCR POSITIVE (A) NEGATIVE Final   Influenza B by PCR NEGATIVE NEGATIVE Final    Comment: (NOTE) The Xpert Xpress SARS-CoV-2/FLU/RSV plus assay is intended as an aid in the diagnosis of influenza from Nasopharyngeal swab specimens and should not be used as a sole  basis for treatment. Nasal washings and aspirates are unacceptable for Xpert Xpress SARS-CoV-2/FLU/RSV testing.  Fact Sheet for Patients: EntrepreneurPulse.com.au  Fact Sheet for Healthcare Providers: IncredibleEmployment.be  This test is not yet approved or cleared by the [place] FDA and has been authorized for detection and/or diagnosis of SARS-CoV-2 by FDA under an Emergency Use Authorization (EUA). This EUA will remain in effect (meaning this test can be used) for the duration of the COVID-19 declaration under Section 564(b)(1) of the Act, 21 U.S.C. section 360bbb-3(b)(1), unless the authorization is terminated or revoked.  Performed at [facility], [address] [place], [place] [address]   Respiratory (~20 pathogens) panel by PCR     Status: Abnormal   Collection Time: 05/20/21  1:04 AM   Specimen: Nasopharyngeal Swab; Respiratory  Result Value Ref Range Status   Adenovirus NOT DETECTED NOT DETECTED Final   Coronavirus 229E NOT DETECTED NOT DETECTED Final    Comment: (NOTE) The Coronavirus on the Respiratory Panel, DOES NOT test for the novel  Coronavirus (2019 nCoV)    Coronavirus HKU1 NOT DETECTED NOT DETECTED Final   Coronavirus NL63 NOT DETECTED NOT DETECTED Final   Coronavirus OC43 NOT DETECTED NOT DETECTED Final   Metapneumovirus NOT DETECTED NOT DETECTED Final   Rhinovirus / Enterovirus NOT DETECTED NOT DETECTED Final   Influenza A H3 DETECTED (A) NOT DETECTED Final   Influenza B NOT DETECTED NOT DETECTED Final   Parainfluenza Virus 1 NOT DETECTED NOT DETECTED Final   Parainfluenza Virus 2 NOT DETECTED NOT DETECTED Final   Parainfluenza Virus 3 NOT DETECTED NOT DETECTED Final   Parainfluenza Virus 4 NOT DETECTED NOT DETECTED Final   Respiratory Syncytial Virus NOT DETECTED NOT DETECTED Final   Bordetella pertussis NOT DETECTED NOT DETECTED Final   Bordetella Parapertussis NOT DETECTED NOT DETECTED Final    Chlamydophila pneumoniae NOT DETECTED NOT DETECTED Final   Mycoplasma pneumoniae NOT DETECTED NOT DETECTED Final    Comment: Performed at [facility], [address] [place], [place] [address]  Culture, blood (Routine X 2) w Reflex to ID Panel     Status: None (Preliminary result)   Collection Time: 05/20/21  1:41 AM   Specimen: BLOOD  Result Value Ref Range Status   Specimen Description BLOOD RIGHT [facility]  Final   Special Requests   Final    BOTTLES DRAWN AEROBIC AND ANAEROBIC Blood Culture adequate volume   Culture   Final    NO GROWTH 2 DAYS Performed at [facility], [address], [place], [place] [address]    Report Status PENDING  Incomplete  Culture, blood (Routine X 2) w Reflex to ID Panel     Status: None (Preliminary result)   Collection Time: 05/20/21  1:57 AM   Specimen: BLOOD  Result Value Ref Range Status   Specimen Description BLOOD LEFT AC  Final   Special Requests   Final    BOTTLES DRAWN AEROBIC AND ANAEROBIC Blood Culture adequate volume   Culture   Final  NO GROWTH 2 DAYS Performed at [facility], [address] [place],  [address]    Report Status PENDING  Incomplete    Procedures and diagnostic studies:  No results found.             LOS: 2 days   Laticha Ferrucci  [facility]   Pager on [email or web address]. If 7PM-7AM, please contact night-coverage at [email or web address]     05/22/2021, 4:14 PM

## 2021-05-22 NOTE — Evaluation (Signed)
Physical Therapy Evaluation Patient Details Name: RUFINA KIMERY MRN: [id number] DOB: 23-Jan-1925 Today's Date: 05/22/2021  History of Present Illness  MAIMOUNA RONDEAU is a 87 y.o. female with medical history significant for Paroxysmal A. fib on Xarelto, hypothyroidism, glaucoma, CLL followed by oncology hypertension,  chronic hyponatremia who lives independently with her daughter nearby who checks in on her and who was in her usual state of health until the day of arrival when she started feeling unwell. Diagnosed with Flu   Clinical Impression  Patient received in bed, daughter at bedside. She is agreeable to PT assessment. Patient is HOH. She is mod independent with bed mobility ( increased effort and time needed). Performed sit to stand with min guard and ambulated 25 feet in room with RW and min assist. O2 saturations on room air ranged from 88-92%. She will continue to benefit from skilled PT while here to improve strength and functional independence.         Recommendations for follow up therapy are one component of a multi-disciplinary discharge planning process, led by the attending physician.  Recommendations may be updated based on patient status, additional functional criteria and insurance authorization.  Follow Up Recommendations Home health PT    Assistance Recommended at Discharge Frequent or constant Supervision/Assistance  Functional Status Assessment Patient has had a recent decline in their functional status and demonstrates the ability to make significant improvements in function in a reasonable and predictable amount of time.  Equipment Recommendations  None recommended by PT    Recommendations for Other Services       Precautions / Restrictions Precautions Precautions: Fall Restrictions Weight Bearing Restrictions: No      Mobility  Bed Mobility Overal bed mobility: Modified Independent             General bed mobility comments: increased time and  effort    Transfers Overall transfer level: Needs assistance Equipment used: Rolling walker (2 wheels) Transfers: Sit to/from Stand Sit to Stand: Min guard                Ambulation/Gait Ambulation/Gait assistance: Min guard Gait Distance (Feet): 25 Feet Assistive device: Rolling walker (2 wheels) Gait Pattern/deviations: Step-through pattern;Decreased step length - right;Decreased step length - left Gait velocity: decreased     General Gait Details: generally steady. O2 saturations down to 88% with walking, recovered quickly to >90% with rest. No home O2 indicated.  Stairs            Wheelchair Mobility    Modified Rankin (Stroke Patients Only)       Balance Overall balance assessment: Mild deficits observed, not formally tested;History of Falls;Needs assistance Sitting-balance support: Feet supported Sitting balance-Leahy Scale: Good     Standing balance support: Bilateral upper extremity supported;During functional activity;Reliant on assistive device for balance Standing balance-Leahy Scale: Fair                               Pertinent Vitals/Pain Pain Assessment: No/denies pain    Home Living Family/patient expects to be discharged to:: Private residence Living Arrangements: Alone Available Help at Discharge: Family;Available 24 hours/day Type of Home: House Home Access: Stairs to enter Entrance Stairs-Rails: Right Entrance Stairs-Number of Steps: 2- planning to stay at her daughter's home when she leaves here   Home Layout: Laundry or work area in Education administrator: [place] (4 wheels);Cane - single point;Grab bars - toilet;Grab bars - tub/shower  Prior Function Prior Level of Function : Independent/Modified Independent;History of Falls (last six months)             Mobility Comments: uses rollator at baseline. ADLs Comments: Daughter assists with meals and assists her with showering, she is able to  dress independently.     Hand Dominance        Extremity/Trunk Assessment   Upper Extremity Assessment Upper Extremity Assessment: Generalized weakness    Lower Extremity Assessment Lower Extremity Assessment: Generalized weakness    Cervical / Trunk Assessment Cervical / Trunk Assessment: Kyphotic  Communication   Communication: No difficulties  Cognition Arousal/Alertness: Awake/alert Behavior During Therapy: WFL for tasks assessed/performed Overall Cognitive Status: Within Functional Limits for tasks assessed                                          General Comments      Exercises     Assessment/Plan    PT Assessment Patient needs continued PT services  PT Problem List Decreased strength;Decreased mobility;Decreased activity tolerance;Decreased balance       PT Treatment Interventions Therapeutic activities;Gait training;Therapeutic exercise;Functional mobility training;Stair training;Patient/family education    PT Goals (Current goals can be found in the Care Plan section)  Acute Rehab PT Goals Patient Stated Goal: to return home PT Goal Formulation: With patient/family Time For Goal Achievement: 05/30/21 Potential to Achieve Goals: Good    Frequency Min 2X/week   Barriers to discharge        Co-evaluation               AM-PAC PT "6 Clicks" Mobility  Outcome Measure Help needed turning from your back to your side while in a flat bed without using bedrails?: None Help needed moving from lying on your back to sitting on the side of a flat bed without using bedrails?: A Little Help needed moving to and from a bed to a chair (including a wheelchair)?: A Little Help needed standing up from a chair using your arms (e.g., wheelchair or bedside chair)?: A Little Help needed to walk in hospital room?: A Little Help needed climbing 3-5 steps with a railing? : A Little 6 Click Score: 19    End of Session Equipment Utilized During  Treatment: Gait belt Activity Tolerance: Patient tolerated treatment well Patient left: in bed;with call bell/phone within reach;with bed alarm set;with family/visitor present Nurse Communication: Mobility status PT Visit Diagnosis: Other abnormalities of gait and mobility (R26.89);Muscle weakness (generalized) (M62.81);History of falling (Z91.81)    Time: 1345-1416 PT Time Calculation (min) (ACUTE ONLY): 31 min   Charges:   PT Evaluation $PT Eval Moderate Complexity: 1 Mod PT Treatments $Gait Training: 8-22 mins        Jazel Nimmons, PT, GCS 05/22/21,2:27 PM

## 2021-05-22 NOTE — Progress Notes (Signed)
I connected with Sydney Mcdaniel on 05/22/21 at  2:30 PM EST by video enabled telemedicine visit and verified that I am speaking with the correct person using two identifiers.   I discussed the limitations, risks, security and privacy concerns of performing an evaluation and management service by telemedicine and the availability of in-person appointments. I also discussed with the patient that there may be a patient responsible charge related to this service. The patient expressed understanding and agreed to proceed.  Other persons participating in the visit and their role in the encounter:  patients daughter  Patient's location:  car Provider's location:  work  Risk analyst Complaint: Pain follow-up of CLL  History of present illness: Patient is a 87 year old female who was diagnosed with Rai stage 0 CLL in in 2016. Peripheral blood flow cytometry revealed involvement by a CD5+, CD23+, CD38- monoclonal B cell population with lambda light chain restriction consistent with CLL/SLL.  There was a small population (8% of the T cells) of double positive (CD4+/CD8+) T cells (described in association with chronic viral infections, autoimmune disorders, chronic inflammatory disorders, and immunodeficiency states).  She has not required any treatment for leukemia so far   Patient has had chronic normocytic anemia with a hemoglobin that fluctuates between 10-11 at least dating back to 2017.  She has a history of B12 deficiency and is on monthly B12 injections.  Iron studies have been normal in the past.  Reticulocyte count was normal indicative of hypoproliferative anemia.  Coombs test has been positive in the past but patient has had a normal to low reticulocyte count, normal LDH and no elevated bilirubin   Patient also has chronic hyponatremia and stable pulmonary nodules which do not require further follow-up  Interval history patient and her daughter are on the way to see nephrologist for her low sodium levels.   Otherwise she is doing well for age.  She has baseline fatigue but denies other complaints   Review of Systems  Constitutional:  Positive for malaise/fatigue. Negative for chills, fever and weight loss.  HENT:  Negative for congestion, ear discharge and nosebleeds.   Eyes:  Negative for blurred vision.  Respiratory:  Negative for cough, hemoptysis, sputum production, shortness of breath and wheezing.   Cardiovascular:  Negative for chest pain, palpitations, orthopnea and claudication.  Gastrointestinal:  Negative for abdominal pain, blood in stool, constipation, diarrhea, heartburn, melena, nausea and vomiting.  Genitourinary:  Negative for dysuria, flank pain, frequency, hematuria and urgency.  Musculoskeletal:  Negative for back pain, joint pain and myalgias.  Skin:  Negative for rash.  Neurological:  Negative for dizziness, tingling, focal weakness, seizures, weakness and headaches.  Endo/Heme/Allergies:  Does not bruise/bleed easily.  Psychiatric/Behavioral:  Negative for depression and suicidal ideas. The patient does not have insomnia.    Allergies  Allergen Reactions   Other    Alphagan [Brimonidine] Itching and Rash   Pravachol [Pravastatin Sodium] Itching and Rash   Pravastatin Itching, Other (See Comments) and Rash    Past Medical History:  Diagnosis Date   Allergy    Anemia    Cataract    CLL (chronic lymphocytic leukemia) (HCC)    GERD (gastroesophageal reflux disease)    Glaucoma    H/O: hysterectomy    Total   Hyperlipidemia    Hypertension    Hypothyroidism    Impaired fasting glucose    Lichen sclerosus    Osteoporosis    Hips   PAF (paroxysmal atrial fibrillation) ([place])    a.  06/15/2016 Event monitor: 4% afib burden; b. CHA2DS2VASc - 4-->Xarelto.   Syncope    a. 03/2016 Echo: EF55-60%, no rwma, mild AI/MR, nl PASP; b. 03/2016 48h Holter: no significant arrhythmias/pauses; c. 03/2016 MV: mild apical defect, likely breast attenuation, nl EF, low risk; d.  05/2016 Event monitor: No significant arrhythmia; e. 05/2016 Event monitor: PAF (4%).    Past Surgical History:  Procedure Laterality Date   APPENDECTOMY     EYE SURGERY     Glaucoma   MULTIPLE TOOTH EXTRACTIONS N/A    2 teeth   TOTAL ABDOMINAL HYSTERECTOMY      Social History   Socioeconomic History   Marital status: Widowed    Spouse name: Not on file   Number of children: 2   Years of education: Not on file   Highest education level: High school graduate  Occupational History   Occupation: retired  Tobacco Use   Smoking status: Never   Smokeless tobacco: Never  Vaping Use   Vaping Use: Never used  Substance and Sexual Activity   Alcohol use: No   Drug use: No   Sexual activity: Not Currently  Other Topics Concern   Not on file  Social History Narrative   Pt lives alone.    Social Determinants of Health   Financial Resource Strain: Low Risk    Difficulty of Paying Living Expenses: Not hard at all  Food Insecurity: No Food Insecurity   Worried About Charity fundraiser in the Last Year: Never true   [place] in the Last Year: Never true  Transportation Needs: No Transportation Needs   Lack of Transportation (Medical): No   Lack of Transportation (Non-Medical): No  Physical Activity: Inactive   Days of Exercise per Week: 0 days   Minutes of Exercise per Session: 0 min  Stress: No Stress Concern Present   Feeling of Stress : Not at all  Social Connections: Moderately Isolated   Frequency of Communication with Friends and Family: More than three times a week   Frequency of Social Gatherings with Friends and Family: More than three times a week   Attends Religious Services: More than 4 times per year   Active Member of [employer] or Organizations: No   Attends Archivist Meetings: Never   Marital Status: Widowed  Human resources officer Violence: Not At Risk   Fear of Current or Ex-Partner: No   Emotionally Abused: No   Physically Abused: No   Sexually  Abused: No    Family History  Problem Relation Age of Onset   Heart attack Mother    Glaucoma Mother    Heart attack Father    Parkinson's disease Brother    Heart attack Brother     No current facility-administered medications for this visit.  Current Outpatient Medications:    amoxicillin-clavulanate (AUGMENTIN) 875-125 MG tablet, Take 1 tablet by mouth 2 (two) times daily for 4 days., Disp: 8 tablet, Rfl: 0   ferrous sulfate 325 (65 FE) MG tablet, Take 1 tablet (325 mg total) by mouth daily with breakfast., Disp: , Rfl: 3   oseltamivir (TAMIFLU) 75 MG capsule, Take 1 capsule (75 mg total) by mouth 2 (two) times daily for 5 doses., Disp: 5 capsule, Rfl: 0   Rivaroxaban (XARELTO) 15 MG TABS tablet, Take 1 tablet (15 mg total) by mouth daily with supper., Disp: , Rfl:   Facility-Administered Medications Ordered in Other Visits:    acetaminophen (TYLENOL) tablet 650 mg, 650 mg, Oral,  Q6H PRN, 650 mg at 05/20/21 2312 **OR** acetaminophen (TYLENOL) suppository 650 mg, 650 mg, Rectal, Q6H PRN, Athena Masse, MD   amLODipine (NORVASC) tablet 5 mg, 5 mg, Oral, Daily, Jennye Boroughs, MD   amoxicillin-clavulanate (AUGMENTIN) 875-125 MG per tablet 1 tablet, 1 tablet, Oral, Q12H, Jennye Boroughs, MD   ferrous sulfate tablet 325 mg, 325 mg, Oral, Daily, Jennye Boroughs, MD, 325 mg at 05/22/21 0850   guaiFENesin-dextromethorphan (ROBITUSSIN DM) 100-10 MG/5ML syrup 5 mL, 5 mL, Oral, Q6H PRN, Jennye Boroughs, MD, 5 mL at 05/21/21 0805   latanoprost (XALATAN) 0.005 % ophthalmic solution 1 drop, 1 drop, Right Eye, QHS, Jennye Boroughs, MD, 1 drop at 05/21/21 2150   levothyroxine (SYNTHROID) tablet 75 mcg, 75 mcg, Oral, Once per day on Sun Mon Wed Fri, [place], Ilona Sorrel, MD, 75 mcg at 05/22/21 0849   levothyroxine (SYNTHROID) tablet 88 mcg, 88 mcg, Oral, Once per day on Tue Thu Sat, [place], Ilona Sorrel, MD, 88 mcg at 05/21/21 0917   lisinopril (ZESTRIL) tablet 20 mg, 20 mg, Oral, Daily, Jennye Boroughs, MD, 20 mg  at 05/21/21 6578   MEDLINE mouth rinse, 15 mL, Mouth Rinse, BID, Judd Gaudier V, MD, 15 mL at 05/22/21 0852   ondansetron (ZOFRAN) tablet 4 mg, 4 mg, Oral, Q6H PRN **OR** ondansetron (ZOFRAN) injection 4 mg, 4 mg, Intravenous, Q6H PRN, Athena Masse, MD   oseltamivir (TAMIFLU) capsule 30 mg, 30 mg, Oral, BID, Jennye Boroughs, MD   polyethylene glycol (MIRALAX / GLYCOLAX) packet 17 g, 17 g, Oral, Daily, Jennye Boroughs, MD, 17 g at 05/22/21 4696   Rivaroxaban (XARELTO) tablet 15 mg, 15 mg, Oral, Q supper, Jennye Boroughs, MD, 15 mg at 05/22/21 1752   senna (SENOKOT) tablet 8.6 mg, 1 tablet, Oral, QHS PRN, Jennye Boroughs, MD   sodium chloride tablet 1 g, 1 g, Oral, TID WC, Jennye Boroughs, MD, 1 g at 05/22/21 1751   timolol (TIMOPTIC) 0.5 % ophthalmic solution 1 drop, 1 drop, Right Eye, BID, Jennye Boroughs, MD, 1 drop at 05/22/21 2952   vitamin B-12 (CYANOCOBALAMIN) tablet 100 mcg, 100 mcg, Oral, Daily, Jennye Boroughs, MD, 100 mcg at 05/22/21 0851  DG Chest 2 View  Result Date: 05/20/2021 CLINICAL DATA:  Cough and chills. EXAM: CHEST - 2 VIEW COMPARISON:  Chest radiograph dated 02/11/2021. FINDINGS: Minimal bibasilar atelectasis. No focal consolidation, pleural effusion, pneumothorax. The cardiac silhouette is within limits. Atherosclerotic calcification of the aorta. Osteopenia with degenerative changes of the spine. No acute osseous pathology. IMPRESSION: No active cardiopulmonary disease. Electronically Signed   By: Anner Crete M.D.   On: 05/20/2021 01:20   CT HEAD WO CONTRAST (5MM)  Result Date: 05/20/2021 CLINICAL DATA:  Generalized weakness for a few days EXAM: CT HEAD WITHOUT CONTRAST TECHNIQUE: Contiguous axial images were obtained from the base of the skull through the vertex without intravenous contrast. COMPARISON:  03/28/2021 FINDINGS: Brain: No evidence of acute infarction, hemorrhage, cerebral edema, mass, mass effect, or midline shift. No hydrocephalus or extra-axial fluid  collection. Moderate to severe periventricular white matter changes, likely the sequela of chronic small vessel ischemic disease. Unchanged diffuse cerebral volume loss. Vascular: No hyperdense vessel. Skull: Normal. Negative for fracture or focal lesion. Sinuses/Orbits: Mucous retention cyst in the left maxillary sinus. Mild mucosal thickening in the ethmoid air cells. Status post bilateral lens replacements. Other: The mastoid air cells are well aerated. IMPRESSION: No acute intracranial process. Electronically Signed   By: Merilyn Baba M.D.   On: 05/20/2021 02:32   CT  Angio Chest Pulmonary Embolism (PE) W or WO Contrast  Result Date: 05/20/2021 CLINICAL DATA:  Hypoxia EXAM: CT ANGIOGRAPHY CHEST WITH CONTRAST TECHNIQUE: Multidetector CT imaging of the chest was performed using the standard protocol during bolus administration of intravenous contrast. Multiplanar CT image reconstructions and MIPs were obtained to evaluate the vascular anatomy. CONTRAST:  91mL OMNIPAQUE IOHEXOL 350 MG/ML SOLN COMPARISON:  Chest CT dated February 11, 2021 FINDINGS: Cardiovascular: Adequate contrast opacification of the pulmonary arteries. No evidence of pulmonary embolus. Normal heart size. No pericardial effusion. Three-vessel coronary artery calcifications. Atherosclerotic disease of the thoracic aorta. Mediastinum/Nodes: Small hiatal hernia. Patulous esophagus. Thyroid is unremarkable. Mildly enlarged mediastinal lymph nodes, likely reactive. Reference subcarinal lymph node measuring 1.4 cm on image 130. Lungs/Pleura: Debris seen in the right lower lobe bronchus and left mainstem bronchus with occlusion of some of the segmental/subsegmental lower lobe bronchi. Right greater than lower lobe consolidations. There is some chest Upper Abdomen: Cholelithiasis.  No acute abnormality. Musculoskeletal: No chest wall abnormality. No acute or significant osseous findings. Review of the MIP images confirms the above findings. IMPRESSION:  1. No evidence of pulmonary embolus. 2. Debris seen in the right lower lobe bronchus and left mainstem bronchus with occlusion of some of the segmental/subsegmental lower lobe bronchi and right greater than left lower lobe consolidations, findings are favored to be due to aspiration pneumonia. 3. Patulous esophagus, findings can be seen in the setting of esophageal dysmotility and places patient at risk for aspiration. 4. Three-vessel coronary artery calcifications. 5.  Aortic Atherosclerosis (ICD10-I70.0). Electronically Signed   By: Yetta Glassman M.D.   On: 05/20/2021 15:57   DG Chest Port 1 View  Result Date: 05/20/2021 CLINICAL DATA:  Submitted images demonstrate duodenal scope positioned over the proximal duodenum. No cholangiography is performed. EXAM: PORTABLE CHEST - 1 VIEW COMPARISON:  Earlier the same day and 02/11/2021 FINDINGS: The mediastinal contours are within normal limits. No cardiomegaly. Atherosclerotic calcification of the aortic arch. The lungs are clear bilaterally without evidence of focal consolidation, pleural effusion, or pneumothorax. No acute osseous abnormality. IMPRESSION: No acute cardiopulmonary process. Aortic Atherosclerosis (ICD10-I70.0). Electronically Signed   By: Ruthann Cancer M.D.   On: 05/20/2021 12:18    No images are attached to the encounter.   CMP Latest Ref Rng & Units 05/22/2021  Glucose 70 - 99 mg/dL -  BUN 8 - 23 mg/dL -  Creatinine 0.44 - 1.00 mg/dL -  Sodium 135 - 145 mmol/L 132(L)  Potassium 3.5 - 5.1 mmol/L -  Chloride 98 - 111 mmol/L -  CO2 22 - 32 mmol/L -  Calcium 8.9 - 10.3 mg/dL -  Total Protein 6.5 - 8.1 g/dL -  Total Bilirubin 0.3 - 1.2 mg/dL -  Alkaline Phos 38 - 126 U/L -  AST 15 - 41 U/L -  ALT 0 - 44 U/L -   CBC Latest Ref Rng & Units 05/22/2021  WBC 4.0 - 10.5 K/uL 8.3  Hemoglobin 12.0 - 15.0 g/dL 8.9(L)  Hematocrit 36.0 - 46.0 % 26.4(L)  Platelets 150 - 400 K/uL 120(L)     Observation/objective: Appears in no acute  distress over video visit today.  Breathing is nonlabored  Assessment and plan: Patient is a 87 year old female with history of Rai stage 0 CLL and this is a routine follow-up visit  Her CBC from 05/12/2021 shows a normal white count of 8.5 with a normal differential.  Hemoglobin is 11.7 which is better than before low normal platelet count.  Patient  reports no unintentional weight loss.  Based on her history and lab work her CLL seems to be stable I will therefore see her back in 6 months in person with CBC ferritin and iron studies B12 and folate.  Patient noted to have hyponatremia for which she will be seeing nephrology today  Follow-up instructions: Labs and see me in 6 months  I discussed the assessment and treatment plan with the patient. The patient was provided an opportunity to ask questions and all were answered. The patient agreed with the plan and demonstrated an understanding of the instructions.   The patient was advised to call back or seek an in-person evaluation if the symptoms worsen or if the condition fails to improve as anticipated.  Visit Diagnosis: 1. CLL (chronic lymphocytic leukemia) ([place])   2. Normocytic anemia   3. B12 deficiency     Dr. Randa Evens, MD, MPH [facility] at [facility] Tel- [phone] 05/22/2021 8:07 PM

## 2021-05-22 NOTE — Progress Notes (Signed)
PIV's removed. Discharge instructions completed. Patient and daughter verbalized understanding of medication regimen, follow up appointments and discharge instructions. Patient belongings gathered and packed to discharge.

## 2021-05-23 ENCOUNTER — Ambulatory Visit: Payer: Medicare Other | Admitting: Internal Medicine

## 2021-05-23 DIAGNOSIS — J101 Influenza due to other identified influenza virus with other respiratory manifestations: Secondary | ICD-10-CM | POA: Diagnosis not present

## 2021-05-23 DIAGNOSIS — I5032 Chronic diastolic (congestive) heart failure: Secondary | ICD-10-CM | POA: Diagnosis not present

## 2021-05-23 DIAGNOSIS — J690 Pneumonitis due to inhalation of food and vomit: Secondary | ICD-10-CM | POA: Diagnosis not present

## 2021-05-23 DIAGNOSIS — J9601 Acute respiratory failure with hypoxia: Secondary | ICD-10-CM | POA: Diagnosis not present

## 2021-05-23 NOTE — Discharge Summary (Addendum)
Physician Discharge Summary  Sydney Mcdaniel EML:[id number] DOB: [birth year] DOA: 05/20/2021  PCP: [place] date: 05/20/2021 Discharge date: 05/23/2021  Discharge disposition: Home with home health therapy   Recommendations for Outpatient Follow-Up:   Follow-up with PCP in 1 week   Discharge Diagnosis:   Principal Problem:   Influenza A Active Problems:   Chronic diastolic congestive heart failure (HCC)   Hyponatremia   Paroxysmal atrial fibrillation (HCC)   Elevated troponin   Aspiration pneumonia ([place])   Acute hypoxemic respiratory failure ([place])    Discharge Condition: Stable.  Diet recommendation:  Diet Order             Diet - low sodium heart healthy                     Code Status: Prior     Hospital Course:   Sydney Mcdaniel is a 87 y.o. female with medical history significant for paroxysmal atrial fibrillation on Xarelto, hypothyroidism, glucoma, CLL, hypertension, chronic hyponatremia, who presented to the hospital with cough, generalized weakness and lethargy.   She was found to have influenza A infection and acute hypoxemic respiratory failure.  She was treated with oxygen and Tamiflu.  She developed worsening hypoxemic respiratory failure and further studies revealed aspiration pneumonia.  She was treated with empiric IV Unasyn and was subsequently switched to Augmentin.  She also had acute on chronic hyponatremia and this was treated with IV fluids and salt tablets.  Elevated troponins were attributed to demand ischemia.  Her condition has improved.  She has been successfully weaned off of oxygen.  She is deemed stable for discharge to home today.  Discharge plan was discussed with the patient and her daughter at the bedside.       Discharge Exam:    Vitals:   05/23/21 0426 05/23/21 0429 05/23/21 0805 05/23/21 0844  BP: 133/87 (!) 124/52 (!) 147/72   Pulse: 78 79 91 85  Resp: 17 17 18    Temp:   (!) 97.3  F (36.3 C)   TempSrc:      SpO2: 100% 99% 99% 95%  Weight:      Height:         GEN: NAD SKIN: Warm and dry EYES: No pallor or icterus ENT: MMM CV: RRR PULM: No wheezing or rales heard ABD: soft, ND, NT, +BS CNS: AAO x 3, non focal EXT: No edema or tenderness    The results of significant diagnostics from this hospitalization (including imaging, microbiology, ancillary and laboratory) are listed below for reference.     Procedures and Diagnostic Studies:   DG Chest 2 View  Result Date: 05/20/2021 CLINICAL DATA:  Cough and chills. EXAM: CHEST - 2 VIEW COMPARISON:  Chest radiograph dated 02/11/2021. FINDINGS: Minimal bibasilar atelectasis. No focal consolidation, pleural effusion, pneumothorax. The cardiac silhouette is within limits. Atherosclerotic calcification of the aorta. Osteopenia with degenerative changes of the spine. No acute osseous pathology. IMPRESSION: No active cardiopulmonary disease. Electronically Signed   By: Anner Crete M.D.   On: 05/20/2021 01:20   CT HEAD WO CONTRAST (5MM)  Result Date: 05/20/2021 CLINICAL DATA:  Generalized weakness for a few days EXAM: CT HEAD WITHOUT CONTRAST TECHNIQUE: Contiguous axial images were obtained from the base of the skull through the vertex without intravenous contrast. COMPARISON:  03/28/2021 FINDINGS: Brain: No evidence of acute infarction, hemorrhage, cerebral edema, mass, mass effect, or midline shift. No hydrocephalus or extra-axial fluid collection. Moderate  to severe periventricular white matter changes, likely the sequela of chronic small vessel ischemic disease. Unchanged diffuse cerebral volume loss. Vascular: No hyperdense vessel. Skull: Normal. Negative for fracture or focal lesion. Sinuses/Orbits: Mucous retention cyst in the left maxillary sinus. Mild mucosal thickening in the ethmoid air cells. Status post bilateral lens replacements. Other: The mastoid air cells are well aerated. IMPRESSION: No acute  intracranial process. Electronically Signed   By: Merilyn Baba M.D.   On: 05/20/2021 02:32   CT Angio Chest Pulmonary Embolism (PE) W or WO Contrast  Result Date: 05/20/2021 CLINICAL DATA:  Hypoxia EXAM: CT ANGIOGRAPHY CHEST WITH CONTRAST TECHNIQUE: Multidetector CT imaging of the chest was performed using the standard protocol during bolus administration of intravenous contrast. Multiplanar CT image reconstructions and MIPs were obtained to evaluate the vascular anatomy. CONTRAST:  43mL OMNIPAQUE IOHEXOL 350 MG/ML SOLN COMPARISON:  Chest CT dated February 11, 2021 FINDINGS: Cardiovascular: Adequate contrast opacification of the pulmonary arteries. No evidence of pulmonary embolus. Normal heart size. No pericardial effusion. Three-vessel coronary artery calcifications. Atherosclerotic disease of the thoracic aorta. Mediastinum/Nodes: Small hiatal hernia. Patulous esophagus. Thyroid is unremarkable. Mildly enlarged mediastinal lymph nodes, likely reactive. Reference subcarinal lymph node measuring 1.4 cm on image 130. Lungs/Pleura: Debris seen in the right lower lobe bronchus and left mainstem bronchus with occlusion of some of the segmental/subsegmental lower lobe bronchi. Right greater than lower lobe consolidations. There is some chest Upper Abdomen: Cholelithiasis.  No acute abnormality. Musculoskeletal: No chest wall abnormality. No acute or significant osseous findings. Review of the MIP images confirms the above findings. IMPRESSION: 1. No evidence of pulmonary embolus. 2. Debris seen in the right lower lobe bronchus and left mainstem bronchus with occlusion of some of the segmental/subsegmental lower lobe bronchi and right greater than left lower lobe consolidations, findings are favored to be due to aspiration pneumonia. 3. Patulous esophagus, findings can be seen in the setting of esophageal dysmotility and places patient at risk for aspiration. 4. Three-vessel coronary artery calcifications. 5.  Aortic  Atherosclerosis (ICD10-I70.0). Electronically Signed   By: Yetta Glassman M.D.   On: 05/20/2021 15:57   DG Chest Port 1 View  Result Date: 05/20/2021 CLINICAL DATA:  Submitted images demonstrate duodenal scope positioned over the proximal duodenum. No cholangiography is performed. EXAM: PORTABLE CHEST - 1 VIEW COMPARISON:  Earlier the same day and 02/11/2021 FINDINGS: The mediastinal contours are within normal limits. No cardiomegaly. Atherosclerotic calcification of the aortic arch. The lungs are clear bilaterally without evidence of focal consolidation, pleural effusion, or pneumothorax. No acute osseous abnormality. IMPRESSION: No acute cardiopulmonary process. Aortic Atherosclerosis (ICD10-I70.0). Electronically Signed   By: Ruthann Cancer M.D.   On: 05/20/2021 12:18     Labs:   Basic Metabolic Panel: Recent Labs  Lab 05/19/21 2052 05/20/21 0519 05/20/21 1502 05/22/21 0606  NA 128*  --  127* 132*  K 3.7  --  3.6  --   CL 98  --  100  --   CO2 22  --  22  --   GLUCOSE 131*  --  138*  --   BUN 14  --  11  --   CREATININE 0.80 0.73 0.72  --   CALCIUM 8.9  --  7.8*  --    GFR Estimated Creatinine Clearance: 39.3 mL/min (by C-G formula based on SCr of 0.72 mg/dL). Liver Function Tests: Recent Labs  Lab 05/19/21 2052  AST 26  ALT 19  ALKPHOS 27*  BILITOT 0.6  PROT  7.2  ALBUMIN 3.8   No results for input(s): LIPASE, AMYLASE in the last 168 hours. No results for input(s): AMMONIA in the last 168 hours. Coagulation profile No results for input(s): INR, PROTIME in the last 168 hours.  CBC: Recent Labs  Lab 05/19/21 2052 05/20/21 0519 05/22/21 0606  WBC 9.8 10.4 8.3  HGB 11.3* 8.9* 8.9*  HCT 33.9* 26.3* 26.4*  MCV 91.4 90.7 90.1  PLT 163 140* 120*   Cardiac Enzymes: No results for input(s): CKTOTAL, CKMB, CKMBINDEX, TROPONINI in the last 168 hours. BNP: Invalid input(s): POCBNP CBG: Recent Labs  Lab 05/19/21 2049 05/22/21 1611  GLUCAP 136* 124*    D-Dimer No results for input(s): DDIMER in the last 72 hours. Hgb A1c No results for input(s): HGBA1C in the last 72 hours. Lipid Profile No results for input(s): CHOL, HDL, LDLCALC, TRIG, CHOLHDL, LDLDIRECT in the last 72 hours. Thyroid function studies No results for input(s): TSH, T4TOTAL, T3FREE, THYROIDAB in the last 72 hours.  Invalid input(s): FREET3 Anemia work up No results for input(s): VITAMINB12, FOLATE, FERRITIN, TIBC, IRON, RETICCTPCT in the last 72 hours. Microbiology Recent Results (from the past 240 hour(s))  Urine Culture     Status: Abnormal   Collection Time: 05/19/21  9:50 PM   Specimen: Urine, Random  Result Value Ref Range Status   Specimen Description   Final    URINE, RANDOM Performed at [facility], [address], [place], [place] [address]    Special Requests   Final    NONE Performed at [facility], [address] [place], [place] [address]    Culture (A)  Final    <10,000 COLONIES/mL INSIGNIFICANT GROWTH Performed at [facility], [place] [address] [place], [place] [address]    Report Status 05/21/2021 FINAL  Final  Resp Panel by RT-PCR (Flu A&B, Covid) Nasopharyngeal Swab     Status: Abnormal   Collection Time: 05/20/21  1:04 AM   Specimen: Nasopharyngeal Swab; Nasopharyngeal(NP) swabs in vial transport medium  Result Value Ref Range Status   SARS Coronavirus 2 by RT PCR NEGATIVE NEGATIVE Final    Comment: (NOTE) SARS-CoV-2 target nucleic acids are NOT DETECTED.  The SARS-CoV-2 RNA is generally detectable in upper respiratory specimens during the acute phase of infection. The lowest concentration of SARS-CoV-2 viral copies this assay can detect is 138 copies/mL. A negative result does not preclude SARS-Cov-2 infection and should not be used as the sole basis for treatment or other patient management decisions. A negative result may occur with  improper specimen collection/handling, submission of specimen  other than nasopharyngeal swab, presence of viral mutation(s) within the areas targeted by this assay, and inadequate number of viral copies(<138 copies/mL). A negative result must be combined with clinical observations, patient history, and epidemiological information. The expected result is Negative.  Fact Sheet for Patients:  EntrepreneurPulse.com.au  Fact Sheet for Healthcare Providers:  IncredibleEmployment.be  This test is no t yet approved or cleared by the [place] FDA and  has been authorized for detection and/or diagnosis of SARS-CoV-2 by FDA under an Emergency Use Authorization (EUA). This EUA will remain  in effect (meaning this test can be used) for the duration of the COVID-19 declaration under Section 564(b)(1) of the Act, 21 U.S.C.section 360bbb-3(b)(1), unless the authorization is terminated  or revoked sooner.       Influenza A by PCR POSITIVE (A) NEGATIVE Final   Influenza B by PCR NEGATIVE NEGATIVE Final    Comment: (  NOTE) The Xpert Xpress SARS-CoV-2/FLU/RSV plus assay is intended as an aid in the diagnosis of influenza from Nasopharyngeal swab specimens and should not be used as a sole basis for treatment. Nasal washings and aspirates are unacceptable for Xpert Xpress SARS-CoV-2/FLU/RSV testing.  Fact Sheet for Patients: EntrepreneurPulse.com.au  Fact Sheet for Healthcare Providers: IncredibleEmployment.be  This test is not yet approved or cleared by the [place] FDA and has been authorized for detection and/or diagnosis of SARS-CoV-2 by FDA under an Emergency Use Authorization (EUA). This EUA will remain in effect (meaning this test can be used) for the duration of the COVID-19 declaration under Section 564(b)(1) of the Act, 21 U.S.C. section 360bbb-3(b)(1), unless the authorization is terminated or revoked.  Performed at [facility], [address]  [place], [place] [address]   Respiratory (~20 pathogens) panel by PCR     Status: Abnormal   Collection Time: 05/20/21  1:04 AM   Specimen: Nasopharyngeal Swab; Respiratory  Result Value Ref Range Status   Adenovirus NOT DETECTED NOT DETECTED Final   Coronavirus 229E NOT DETECTED NOT DETECTED Final    Comment: (NOTE) The Coronavirus on the Respiratory Panel, DOES NOT test for the novel  Coronavirus (2019 nCoV)    Coronavirus HKU1 NOT DETECTED NOT DETECTED Final   Coronavirus NL63 NOT DETECTED NOT DETECTED Final   Coronavirus OC43 NOT DETECTED NOT DETECTED Final   Metapneumovirus NOT DETECTED NOT DETECTED Final   Rhinovirus / Enterovirus NOT DETECTED NOT DETECTED Final   Influenza A H3 DETECTED (A) NOT DETECTED Final   Influenza B NOT DETECTED NOT DETECTED Final   Parainfluenza Virus 1 NOT DETECTED NOT DETECTED Final   Parainfluenza Virus 2 NOT DETECTED NOT DETECTED Final   Parainfluenza Virus 3 NOT DETECTED NOT DETECTED Final   Parainfluenza Virus 4 NOT DETECTED NOT DETECTED Final   Respiratory Syncytial Virus NOT DETECTED NOT DETECTED Final   Bordetella pertussis NOT DETECTED NOT DETECTED Final   Bordetella Parapertussis NOT DETECTED NOT DETECTED Final   Chlamydophila pneumoniae NOT DETECTED NOT DETECTED Final   Mycoplasma pneumoniae NOT DETECTED NOT DETECTED Final    Comment: Performed at [facility], [address], [place], [place] [address]  Culture, blood (Routine X 2) w Reflex to ID Panel     Status: None (Preliminary result)   Collection Time: 05/20/21  1:41 AM   Specimen: BLOOD  Result Value Ref Range Status   Specimen Description BLOOD RIGHT [facility]  Final   Special Requests   Final    BOTTLES DRAWN AEROBIC AND ANAEROBIC Blood Culture adequate volume   Culture   Final    NO GROWTH 3 DAYS Performed at [facility], [address] [place], [place] [address]    Report Status PENDING  Incomplete  Culture, blood (Routine X 2) w Reflex to ID Panel     Status: None  (Preliminary result)   Collection Time: 05/20/21  1:57 AM   Specimen: BLOOD  Result Value Ref Range Status   Specimen Description BLOOD LEFT [facility]  Final   Special Requests   Final    BOTTLES DRAWN AEROBIC AND ANAEROBIC Blood Culture adequate volume   Culture   Final    NO GROWTH 3 DAYS Performed at [facility], [address] [place], [place] [address]    Report Status PENDING  Incomplete     Discharge Instructions:   Discharge Instructions     Diet - low sodium heart healthy   Complete by: As  directed    Increase activity slowly   Complete by: As directed       Allergies as of 05/23/2021       Reactions   Other    Alphagan [brimonidine] Itching, Rash   Pravachol [pravastatin Sodium] Itching, Rash   Pravastatin Itching, Other (See Comments), Rash        Medication List     STOP taking these medications    OVER THE COUNTER MEDICATION       TAKE these medications    acetaminophen 500 MG tablet Commonly known as: TYLENOL Take 1,000 mg by mouth every 8 (eight) hours as needed.   alendronate 70 MG tablet Commonly known as: FOSAMAX TAKE 1 TABLET BY MOUTH ONCE A WEEK WITH A FULL GLASS OF WATER ON AN EMPTY STOMACH What changed:  how much to take how to take this when to take this additional instructions   amLODipine 5 MG tablet Commonly known as: NORVASC Take 5 mg by mouth daily. Takes at 1300   amoxicillin-clavulanate 875-125 MG tablet Commonly known as: Augmentin Take 1 tablet by mouth 2 (two) times daily for 4 days.   ferrous sulfate 325 (65 FE) MG tablet Take 1 tablet (325 mg total) by mouth daily with breakfast. What changed: when to take this   latanoprost 0.005 % ophthalmic solution Commonly known as: XALATAN Place 1 drop into the right eye at bedtime.   levothyroxine 75 MCG tablet Commonly known as: Euthyrox TAKE ONE TABLET BY MOUTH ON MONDAYS, WEDNESDAYS, FRIDAYS, AND SUNDAYS What changed:  how much to take how to take  this when to take this additional instructions   levothyroxine 88 MCG tablet Commonly known as: Euthyrox TAKE 1 TABLET BY MOUTH ON TUESDAYS, THURSDAYS, AND SATURDAYS What changed:  how much to take how to take this when to take this   lisinopril 20 MG tablet Commonly known as: ZESTRIL Take 1 tablet by mouth once daily   oseltamivir 75 MG capsule Commonly known as: Tamiflu Take 1 capsule (75 mg total) by mouth 2 (two) times daily for 5 doses.   polyethylene glycol 17 g packet Commonly known as: MIRALAX / GLYCOLAX Take 17 g by mouth daily.   Rhopressa 0.02 % Soln Generic drug: Netarsudil Dimesylate Place 1 drop into the right eye at bedtime.   Rivaroxaban 15 MG Tabs tablet Commonly known as: XARELTO Take 1 tablet (15 mg total) by mouth daily with supper.   senna 8.6 MG tablet Commonly known as: SENOKOT Take 1 tablet by mouth as needed. Uses only when miralax does not work.   timolol 0.5 % ophthalmic solution Commonly known as: TIMOPTIC Place 1 drop into the right eye 2 (two) times daily. Instill 1 drop in the morning and 1 drop at 1700   vitamin B-12 100 MCG tablet Commonly known as: CYANOCOBALAMIN Take 100 mcg by mouth daily.           If you experience worsening of your admission symptoms, develop shortness of breath, life threatening emergency, suicidal or homicidal thoughts you must seek medical attention immediately by calling 911 or calling your MD immediately  if symptoms less severe.   You must read complete instructions/literature along with all the possible adverse reactions/side effects for all the medicines you take and that have been prescribed to you. Take any new medicines after you have completely understood and accept all the possible adverse reactions/side effects.    Please note   You were cared for by a hospitalist during  your hospital stay. If you have any questions about your discharge medications or the care you received while you were in  the hospital after you are discharged, you can call the unit and asked to speak with the hospitalist on call if the hospitalist that took care of you is not available. Once you are discharged, your primary care physician will handle any further medical issues. Please note that NO REFILLS for any discharge medications will be authorized once you are discharged, as it is imperative that you return to your primary care physician (or establish a relationship with a primary care physician if you do not have one) for your aftercare needs so that they can reassess your need for medications and monitor your lab values.       Time coordinating discharge: 29 minutes  Signed:  Dainel Arcidiacono  [facility] 05/23/2021, 8:46 PM   Pager on [email or web address]. If 7PM-7AM, please contact night-coverage at [email or web address]

## 2021-05-23 NOTE — Progress Notes (Signed)
Discharge instructions reviewed with daughter [name] at bedside. All questions answered at this time. Daughter to transport patient home by [facility]. Patient belongings returned and packed by daughter. HH in place per [facility] note with [facility]. Patient not going home with any equipment at this time. It has been a pleasure to take care of this patient.

## 2021-05-25 ENCOUNTER — Telehealth: Payer: Self-pay

## 2021-05-25 DIAGNOSIS — M810 Age-related osteoporosis without current pathological fracture: Secondary | ICD-10-CM | POA: Diagnosis not present

## 2021-05-25 DIAGNOSIS — D630 Anemia in neoplastic disease: Secondary | ICD-10-CM | POA: Diagnosis not present

## 2021-05-25 DIAGNOSIS — C9110 Chronic lymphocytic leukemia of B-cell type not having achieved remission: Secondary | ICD-10-CM | POA: Diagnosis not present

## 2021-05-25 DIAGNOSIS — Z7901 Long term (current) use of anticoagulants: Secondary | ICD-10-CM | POA: Diagnosis not present

## 2021-05-25 DIAGNOSIS — H409 Unspecified glaucoma: Secondary | ICD-10-CM | POA: Diagnosis not present

## 2021-05-25 DIAGNOSIS — Z9181 History of falling: Secondary | ICD-10-CM | POA: Diagnosis not present

## 2021-05-25 DIAGNOSIS — I480 Paroxysmal atrial fibrillation: Secondary | ICD-10-CM | POA: Diagnosis not present

## 2021-05-25 DIAGNOSIS — E785 Hyperlipidemia, unspecified: Secondary | ICD-10-CM | POA: Diagnosis not present

## 2021-05-25 DIAGNOSIS — I5032 Chronic diastolic (congestive) heart failure: Secondary | ICD-10-CM | POA: Diagnosis not present

## 2021-05-25 DIAGNOSIS — J9601 Acute respiratory failure with hypoxia: Secondary | ICD-10-CM | POA: Diagnosis not present

## 2021-05-25 DIAGNOSIS — J690 Pneumonitis due to inhalation of food and vomit: Secondary | ICD-10-CM | POA: Diagnosis not present

## 2021-05-25 DIAGNOSIS — E871 Hypo-osmolality and hyponatremia: Secondary | ICD-10-CM | POA: Diagnosis not present

## 2021-05-25 DIAGNOSIS — J1089 Influenza due to other identified influenza virus with other manifestations: Secondary | ICD-10-CM | POA: Diagnosis not present

## 2021-05-25 DIAGNOSIS — R7301 Impaired fasting glucose: Secondary | ICD-10-CM | POA: Diagnosis not present

## 2021-05-25 DIAGNOSIS — H269 Unspecified cataract: Secondary | ICD-10-CM | POA: Diagnosis not present

## 2021-05-25 DIAGNOSIS — I110 Hypertensive heart disease with heart failure: Secondary | ICD-10-CM | POA: Diagnosis not present

## 2021-05-25 DIAGNOSIS — E039 Hypothyroidism, unspecified: Secondary | ICD-10-CM | POA: Diagnosis not present

## 2021-05-25 LAB — CULTURE, BLOOD (ROUTINE X 2)
Culture: NO GROWTH
Culture: NO GROWTH
Special Requests: ADEQUATE
Special Requests: ADEQUATE

## 2021-05-25 NOTE — Telephone Encounter (Signed)
Copied from [place] [id number]. Topic: General - Other >> May 25, 2021  2:25 PM Fields, [employer] R wrote: Reason for CRM: Gerald Stabs PT from [facility] requesting verbal orders for PT 2x week for 4 weeks then 1x week for 4 weeks

## 2021-05-26 NOTE — Telephone Encounter (Signed)
Verbal given 

## 2021-05-27 ENCOUNTER — Ambulatory Visit: Payer: Medicare Other | Admitting: Internal Medicine

## 2021-05-31 DIAGNOSIS — J1089 Influenza due to other identified influenza virus with other manifestations: Secondary | ICD-10-CM | POA: Diagnosis not present

## 2021-05-31 DIAGNOSIS — J690 Pneumonitis due to inhalation of food and vomit: Secondary | ICD-10-CM | POA: Diagnosis not present

## 2021-05-31 DIAGNOSIS — I5032 Chronic diastolic (congestive) heart failure: Secondary | ICD-10-CM | POA: Diagnosis not present

## 2021-05-31 DIAGNOSIS — J9601 Acute respiratory failure with hypoxia: Secondary | ICD-10-CM | POA: Diagnosis not present

## 2021-05-31 DIAGNOSIS — Z23 Encounter for immunization: Secondary | ICD-10-CM | POA: Diagnosis not present

## 2021-05-31 DIAGNOSIS — I110 Hypertensive heart disease with heart failure: Secondary | ICD-10-CM | POA: Diagnosis not present

## 2021-05-31 DIAGNOSIS — E871 Hypo-osmolality and hyponatremia: Secondary | ICD-10-CM | POA: Diagnosis not present

## 2021-06-01 ENCOUNTER — Telehealth: Payer: Self-pay | Admitting: Unknown Physician Specialty

## 2021-06-01 ENCOUNTER — Encounter: Payer: Self-pay | Admitting: Nurse Practitioner

## 2021-06-01 ENCOUNTER — Other Ambulatory Visit: Payer: Self-pay

## 2021-06-01 ENCOUNTER — Ambulatory Visit (INDEPENDENT_AMBULATORY_CARE_PROVIDER_SITE_OTHER): Payer: Medicare Other | Admitting: Nurse Practitioner

## 2021-06-01 VITALS — BP 150/58 | HR 63 | Ht 62.0 in | Wt 138.0 lb

## 2021-06-01 DIAGNOSIS — I5032 Chronic diastolic (congestive) heart failure: Secondary | ICD-10-CM | POA: Diagnosis not present

## 2021-06-01 DIAGNOSIS — I6523 Occlusion and stenosis of bilateral carotid arteries: Secondary | ICD-10-CM

## 2021-06-01 DIAGNOSIS — E871 Hypo-osmolality and hyponatremia: Secondary | ICD-10-CM | POA: Diagnosis not present

## 2021-06-01 DIAGNOSIS — D649 Anemia, unspecified: Secondary | ICD-10-CM | POA: Diagnosis not present

## 2021-06-01 DIAGNOSIS — E785 Hyperlipidemia, unspecified: Secondary | ICD-10-CM | POA: Diagnosis not present

## 2021-06-01 DIAGNOSIS — I10 Essential (primary) hypertension: Secondary | ICD-10-CM | POA: Diagnosis not present

## 2021-06-01 DIAGNOSIS — I248 Other forms of acute ischemic heart disease: Secondary | ICD-10-CM

## 2021-06-01 DIAGNOSIS — I480 Paroxysmal atrial fibrillation: Secondary | ICD-10-CM | POA: Diagnosis not present

## 2021-06-01 MED ORDER — LISINOPRIL 40 MG PO TABS: 40.0000 mg | ORAL_TABLET | Freq: Every day | ORAL | 1 refills | Status: DC

## 2021-06-01 MED ORDER — RIVAROXABAN 15 MG PO TABS: 15.0000 mg | ORAL_TABLET | Freq: Every day | ORAL | 2 refills | Status: DC

## 2021-06-01 NOTE — Telephone Encounter (Signed)
Verbal order given  

## 2021-06-01 NOTE — Patient Instructions (Signed)
Medication Instructions:   Your physician has recommended you make the following change in your medication:    INCREASE your Lisinopril to 40 MG once a day.  *If you need a refill on your cardiac medications before your next appointment, please call your pharmacy*   Lab Work:  A CBC, and a BMP was drawn in office today.  If you have labs (blood work) drawn today and your tests are completely normal, you will receive your results only by: [place] (if you have MyChart) OR A paper copy in the mail If you have any lab test that is abnormal or we need to change your treatment, we will call you to review the results.   Testing/Procedures:   Your physician has requested that you have an echocardiogram. Echocardiography is a painless test that uses sound waves to create images of your heart. It provides your doctor with information about the size and shape of your heart and how well your heart's chambers and valves are working. This procedure takes approximately one hour. There are no restrictions for this procedure.  Your physician has requested that you have a carotid duplex. This test is an ultrasound of the carotid arteries in your neck. It looks at blood flow through these arteries that supply the brain with blood. Allow one hour for this exam. There are no restrictions or special instructions.    Follow-Up: At [facility], you and your health needs are our priority.  As part of our continuing mission to provide you with exceptional heart care, we have created designated Provider Care Teams.  These Care Teams include your primary Cardiologist (physician) and Advanced Practice Providers (APPs -  Physician Assistants and Nurse Practitioners) who all work together to provide you with the care you need, when you need it.  We recommend signing up for the patient portal called "MyChart".  Sign up information is provided on this After Visit Summary.  MyChart is used to connect with  patients for Virtual Visits (Telemedicine).  Patients are able to view lab/test results, encounter notes, upcoming appointments, etc.  Non-urgent messages can be sent to your provider as well.   To learn more about what you can do with MyChart, go to [email or web address].    Your next appointment:   2 month(s)  The format for your next appointment:   In Person  Provider:   You may see Nelva Bush, MD or one of the following Advanced Practice Providers on your designated Care Team:    [name], NP }    Other Instructions

## 2021-06-01 NOTE — Progress Notes (Signed)
Office Visit    Patient Name: Sydney Mcdaniel Date of Encounter: 06/01/2021  Primary Care Provider:  [place] Primary Cardiologist:  Nelva Bush, MD  Chief Complaint    87 year old female with a history of paroxysmal atrial fibrillation, HFpEF, sinus bradycardia, hypertension, hyperlipidemia, CLL, B12 deficiency, GERD, vasovagal syncope, hypothyroidism, and carotid arterial disease, presents for follow-up related to demand ischemia in the setting of influenza A and aspiration pneumonia.  Past Medical History    Past Medical History:  Diagnosis Date   Allergy    Anemia    Carotid arterial disease ([place])    a. 04/2020 Carotis U/S: 1-39% bilat ICA stenosis, <50% bilat ECA stenosis ->f/u 1 yr.   Cataract    CLL (chronic lymphocytic leukemia) (HCC)    Diastolic dysfunction    a. 02/2019 Echo: EF 60-65%, no rwma, doppler parameters consistent w/ pseudonormalization. Nl RV size/fxn.   GERD (gastroesophageal reflux disease)    Glaucoma    H/O: hysterectomy    Total   History of stress test    a. 02/2019 MV: EF 60%, no ischemia/infarct.   Hyperlipidemia    Hypertension    Hypothyroidism    Impaired fasting glucose    Lichen sclerosus    Osteoporosis    Hips   PAF (paroxysmal atrial fibrillation) ([place])    a. 06/15/2016 Event monitor: 4% afib burden; b. CHA2DS2VASc - 4-->Xarelto.   Sinus bradycardia    a. avoid AVN blocking agents.   Syncope    a. 03/2016 Echo: EF 55-60%, no rwma, mild AI/MR, nl PASP; b. 03/2016 48h Holter: no significant arrhythmias/pauses; c. 03/2016 MV: mild apical defect, likely breast attenuation, nl EF, low risk; d. 05/2016 Event monitor: No significant arrhythmia; e. 05/2016 Event monitor: PAF (4%).   Past Surgical History:  Procedure Laterality Date   APPENDECTOMY     EYE SURGERY     Glaucoma   MULTIPLE TOOTH EXTRACTIONS N/A    2 teeth   TOTAL ABDOMINAL HYSTERECTOMY      Allergies  Allergies  Allergen Reactions    Other    Alphagan [Brimonidine] Itching and Rash   Pravachol [Pravastatin Sodium] Itching and Rash   Pravastatin Itching, Other (See Comments) and Rash    History of Present Illness    87 year old female with the above past medical history including paroxysmal atrial fibrillation, sinus bradycardia, HFpEF, hypertension, hyperlipidemia, CLL, B12 deficiency, GERD, vasovagal syncope, hypothyroidism, and carotid arterial disease.  In the fall 2017, she was admitted for recurrent presyncope and syncope with echocardiogram showing normal LV function.  Stress testing was nonischemic.  She subsequently wore a Holter monitor followed by 2 separate event monitors in December 2017 which did not show any significant bradycardia or tachyarrhythmias but did show a 4% paroxysmal atrial fibrillation burden.  She was then placed on Xarelto.  In September 2020, she was hospitalized with chest pain and hypertensive urgency.  Pain was felt to be atypical and she ruled out.  Stress testing was undertaken and showed no evidence of ischemia or infarct.  Echocardiogram was also performed and showed an EF of 60 to 16% with diastolic dysfunction.  Ms. Whittle was last seen in cardiology clinic in late August of this year following hospitalization for hyponatremia and SIADH.  She was doing well from a cardiac standpoint.  Unfortunately, she was readmitted in October 2022, with acute weakness, imbalance, and confusion.  She was hyponatremic with a sodium of 125.  She was admitted and given IV  fluids as well as Rocephin for possible UTI, and sodium returned to baseline.  She was subsequently discharged to October 4.  She was recently readmitted November 25 in the setting of cough, weakness, and lethargy with finding of Flu A infection and aspiration pneumonia noted on CT.  She was treated with Tamiflu, intravenous Unasyn, and this was subsequently switched to Augmentin.  She did have acute on chronic hyponatremia that improved with IV  fluids and salt tablets.  Troponins were mildly elevated at 20  33  75, which was felt to be due to demand ischemia in the setting of respiratory failure.  Following symptomatic improvement, she was discharged home November 28.  Since discharge, she has noted steady improvement in strength.  She is more or less sedentary but is able to do some things around the home without any chest pain or dyspnea.  She denies palpitations, PND, orthopnea, dizziness, syncope, or early satiety.  She does have chronic, mild bilateral ankle edema and does not require diuretic.  Home Medications    Current Outpatient Medications  Medication Sig Dispense Refill   acetaminophen (TYLENOL) 500 MG tablet Take 1,000 mg by mouth every 8 (eight) hours as needed.     alendronate (FOSAMAX) 70 MG tablet TAKE 1 TABLET BY MOUTH ONCE A WEEK WITH A FULL GLASS OF WATER ON AN EMPTY STOMACH (Patient taking differently: Take 70 mg by mouth once a week. Takes on Fridays) 12 tablet 0   amLODipine (NORVASC) 5 MG tablet Take 5 mg by mouth daily.     ferrous sulfate 325 (65 FE) MG tablet Take 1 tablet (325 mg total) by mouth daily with breakfast.  3   latanoprost (XALATAN) 0.005 % ophthalmic solution Place 1 drop into the right eye at bedtime.      levothyroxine (EUTHYROX) 75 MCG tablet TAKE ONE TABLET BY MOUTH ON MONDAYS, WEDNESDAYS, FRIDAYS, AND SUNDAYS (Patient taking differently: Take 75 mcg by mouth See admin instructions. TAKE ONE TABLET BY MOUTH ON SUNDAYS, MONDAYS, WEDNESDAYS, FRIDAYS) 52 tablet 2   levothyroxine (EUTHYROX) 88 MCG tablet TAKE 1 TABLET BY MOUTH ON TUESDAYS, THURSDAYS, AND SATURDAYS (Patient taking differently: Take 88 mcg by mouth See admin instructions. TAKE 1 TABLET BY MOUTH ON TUESDAYS, THURSDAYS, AND SATURDAYS) 39 tablet 2   lisinopril (ZESTRIL) 20 MG tablet Take 1 tablet by mouth once daily 90 tablet 0   Netarsudil Dimesylate (RHOPRESSA) 0.02 % SOLN Place 1 drop into the right eye at bedtime.     polyethylene  glycol (MIRALAX / GLYCOLAX) 17 g packet Take 17 g by mouth daily.     Rivaroxaban (XARELTO) 15 MG TABS tablet Take 1 tablet (15 mg total) by mouth daily with supper.     senna (SENOKOT) 8.6 MG tablet Take 1 tablet by mouth as needed. Uses only when miralax does not work.     timolol (TIMOPTIC) 0.5 % ophthalmic solution Place 1 drop into the right eye 2 (two) times daily. Instill 1 drop in the morning and 1 drop at 1700     vitamin B-12 (CYANOCOBALAMIN) 100 MCG tablet Take 100 mcg by mouth daily.     No current facility-administered medications for this visit.     Review of Systems    Recovering from flu/aspiration pneumonia.  No chest pain or dyspnea.  Denies palpitations, PND, orthopnea, dizziness, syncope, or early satiety.  She does have mild, chronic bilateral ankle edema.  All other systems reviewed and are otherwise negative except as noted above.  Physical Exam    VS:  BP (!) 150/58 (BP Location: Left Arm, Patient Position: Sitting, Cuff Size: Normal) Comment: After EKG  Pulse 63   Ht 5\' 2"  (1.575 m)   Wt 138 lb (62.6 kg)   LMP  (LMP Unknown)   SpO2 98%   BMI 25.24 kg/m  , BMI Body mass index is 25.24 kg/m.     GEN: Well nourished, well developed, in no acute distress. HEENT: normal. Neck: Supple, no JVD, carotid bruits, or masses. Cardiac: RRR, distant, no murmurs, rubs, or gallops. No clubbing, cyanosis, 1+ bilateral ankle edema.  Radials/PT 2+ and equal bilaterally.  Respiratory:  Respirations regular and unlabored, clear to auscultation bilaterally. GI: Soft, nontender, nondistended, BS + x 4. MS: no deformity or atrophy. Skin: warm and dry, no rash. Neuro:  Strength and sensation are intact. Psych: Normal affect.  Accessory Clinical Findings    ECG personally reviewed by me today -regular sinus rhythm, 63, first-degree AV block, left bundle branch block- no acute changes.  Lab Results  Component Value Date   WBC 8.3 05/22/2021   HGB 8.9 (L) 05/22/2021   HCT  26.4 (L) 05/22/2021   MCV 90.1 05/22/2021   PLT 120 (L) 05/22/2021   Lab Results  Component Value Date   CREATININE 0.72 05/20/2021   BUN 11 05/20/2021   NA 132 (L) 05/22/2021   K 3.6 05/20/2021   CL 100 05/20/2021   CO2 22 05/20/2021   Lab Results  Component Value Date   ALT 19 05/19/2021   AST 26 05/19/2021   ALKPHOS 27 (L) 05/19/2021   BILITOT 0.6 05/19/2021   Lab Results  Component Value Date   CHOL 171 11/17/2020   HDL 55 11/17/2020   LDLCALC 90 11/17/2020   TRIG 147 11/17/2020   CHOLHDL 3.1 11/17/2020    Lab Results  Component Value Date   HGBA1C 6.0 (H) 07/02/2018    Assessment & Plan    1.  Demand ischemia: Patient recently admitted with influenza A and aspiration pneumonia.  In the setting of respiratory failure, she had mild high-sensitivity troponin bump of 20  33  75.  She has since been recovering well and denies chest pain or dyspnea today.  CTA of the chest during hospitalization showed multivessel coronary calcifications.  She was not experiencing any chest pain prior to her hospitalization.  She previously underwent stress testing in September 2020, which was low risk without ischemia or infarct.  Echo at that time showed normal LV function and diastolic dysfunction.  Her daughter was present today.  We discussed the findings of demand ischemia in the setting of respiratory failure and cardiovascular risk.  We agreed to pursue an echocardiogram to reevaluate LV function and if normal, we can consider noninvasive stress testing versus ongoing conservative management with medical therapy.  Her daughter points out that she stopped atorvastatin therapy in October, in an effort to reduce the number of medications that she is taking.  We discussed that in the setting of demand ischemia, coronary calcifications, and known carotid arterial disease, that she should resume atorvastatin, which she will do today.  She is not on aspirin in the setting of chronic Xarelto.  No  beta-blocker in the setting of sinus bradycardia.  2.  Paroxysmal atrial fibrillation: Maintaining sinus rhythm.  She is anticoagulated with reduced dose Xarelto.  3.  Chronic HFpEF: Weight is recorded as being down since hospitalization and relatively stable over time.  She does have mild, chronic 1+  ankle edema.  I suspect amlodipine may be playing a role in this.  We discussed the importance of keeping her legs elevated and wearing compression hose.  As her blood pressure is elevated, I am titrating her lisinopril today.  4.  Essential hypertension: Blood pressure elevated at 178/60 today.  She has been elevated in the 150s and above at home since discharge.  As above, I am increasing her lisinopril to 40 mg today.  I would not titrate amlodipine any further given chronic lower extremity swelling and if possible, would look to reduce amlodipine therapy or replace with an alternate in the future.    5.  Hyperlipidemia: LDL of 90 in May.  I encouraged her to resume her atorvastatin therapy, which she stopped earlier this year in an effort to reduce the number of medication she is taking.  She plans to resume today.  6.  Hyponatremia: This has been noted on 3 separate hospitalizations including this most recent hospitalization for flu and pneumonia.  She has been seen by nephrology with recommendation to limit oral intake fluid intake.  I am following up a basic metabolic panel today.  7.  Normocytic anemia: Hemoglobin of 8.9 with hematocrit of 26.4 November 27.  She was 11.3 and 33.9 November 24.  In setting of chronic Xarelto, I will follow-up a CBC today.  8.  Carotid arterial disease: 1 to 39% bilateral ICA stenosis by ultrasound November 2021.  She is due for follow-up, which I will arrange.  Resuming statin.  9.  Respiratory Failure/Flu A/Aspiration PNA:  recovering well.  10.  Disposition: Follow-up CBC and basic metabolic panel today.  Follow-up echocardiogram and carotid ultrasound in the  near future.  She has nephrology follow-up later this month.  [name], NP 06/01/2021, 11:13 AM

## 2021-06-01 NOTE — Telephone Encounter (Signed)
Home Health Verbal Orders - Caller/Agency:[facility] Callback Number: [phone] opt 2 Requesting PT Frequency:1x1 /2x3/ 1x5

## 2021-06-02 DIAGNOSIS — E871 Hypo-osmolality and hyponatremia: Secondary | ICD-10-CM | POA: Diagnosis not present

## 2021-06-02 DIAGNOSIS — I110 Hypertensive heart disease with heart failure: Secondary | ICD-10-CM | POA: Diagnosis not present

## 2021-06-02 DIAGNOSIS — J1089 Influenza due to other identified influenza virus with other manifestations: Secondary | ICD-10-CM | POA: Diagnosis not present

## 2021-06-02 DIAGNOSIS — J9601 Acute respiratory failure with hypoxia: Secondary | ICD-10-CM | POA: Diagnosis not present

## 2021-06-02 DIAGNOSIS — I5032 Chronic diastolic (congestive) heart failure: Secondary | ICD-10-CM | POA: Diagnosis not present

## 2021-06-02 DIAGNOSIS — J690 Pneumonitis due to inhalation of food and vomit: Secondary | ICD-10-CM | POA: Diagnosis not present

## 2021-06-02 LAB — BASIC METABOLIC PANEL
BUN/Creatinine Ratio: 21 (ref 12–28)
BUN: 12 mg/dL (ref 10–36)
CO2: 23 mmol/L (ref 20–29)
Calcium: 9.3 mg/dL (ref 8.7–10.3)
Chloride: 101 mmol/L (ref 96–106)
Creatinine, Ser: 0.56 mg/dL — ABNORMAL LOW (ref 0.57–1.00)
Glucose: 120 mg/dL — ABNORMAL HIGH (ref 70–99)
Potassium: 4.2 mmol/L (ref 3.5–5.2)
Sodium: 137 mmol/L (ref 134–144)
eGFR: 85 mL/min/{1.73_m2} (ref >59)

## 2021-06-02 LAB — CBC
Hematocrit: 31.7 % — ABNORMAL LOW (ref 34.0–46.6)
Hemoglobin: 10.3 g/dL — ABNORMAL LOW (ref 11.1–15.9)
MCH: 28.4 pg (ref 26.6–33.0)
MCHC: 32.5 g/dL (ref 31.5–35.7)
MCV: 87 fL (ref 79–97)
Platelets: 397 10*3/uL (ref 150–450)
RBC: 3.63 x10E6/uL — ABNORMAL LOW (ref 3.77–5.28)
RDW: 14.1 % (ref 11.7–15.4)
WBC: 7.7 10*3/uL (ref 3.4–10.8)

## 2021-06-06 DIAGNOSIS — J690 Pneumonitis due to inhalation of food and vomit: Secondary | ICD-10-CM | POA: Diagnosis not present

## 2021-06-06 DIAGNOSIS — E871 Hypo-osmolality and hyponatremia: Secondary | ICD-10-CM | POA: Diagnosis not present

## 2021-06-06 DIAGNOSIS — J1089 Influenza due to other identified influenza virus with other manifestations: Secondary | ICD-10-CM | POA: Diagnosis not present

## 2021-06-06 DIAGNOSIS — J9601 Acute respiratory failure with hypoxia: Secondary | ICD-10-CM | POA: Diagnosis not present

## 2021-06-06 DIAGNOSIS — I5032 Chronic diastolic (congestive) heart failure: Secondary | ICD-10-CM | POA: Diagnosis not present

## 2021-06-06 DIAGNOSIS — I110 Hypertensive heart disease with heart failure: Secondary | ICD-10-CM | POA: Diagnosis not present

## 2021-06-08 DIAGNOSIS — J9601 Acute respiratory failure with hypoxia: Secondary | ICD-10-CM | POA: Diagnosis not present

## 2021-06-08 DIAGNOSIS — E871 Hypo-osmolality and hyponatremia: Secondary | ICD-10-CM | POA: Diagnosis not present

## 2021-06-08 DIAGNOSIS — I110 Hypertensive heart disease with heart failure: Secondary | ICD-10-CM | POA: Diagnosis not present

## 2021-06-08 DIAGNOSIS — J1089 Influenza due to other identified influenza virus with other manifestations: Secondary | ICD-10-CM | POA: Diagnosis not present

## 2021-06-08 DIAGNOSIS — I5032 Chronic diastolic (congestive) heart failure: Secondary | ICD-10-CM | POA: Diagnosis not present

## 2021-06-08 DIAGNOSIS — J690 Pneumonitis due to inhalation of food and vomit: Secondary | ICD-10-CM | POA: Diagnosis not present

## 2021-06-09 DIAGNOSIS — J9601 Acute respiratory failure with hypoxia: Secondary | ICD-10-CM | POA: Diagnosis not present

## 2021-06-09 DIAGNOSIS — E871 Hypo-osmolality and hyponatremia: Secondary | ICD-10-CM | POA: Diagnosis not present

## 2021-06-09 DIAGNOSIS — J690 Pneumonitis due to inhalation of food and vomit: Secondary | ICD-10-CM | POA: Diagnosis not present

## 2021-06-09 DIAGNOSIS — J1089 Influenza due to other identified influenza virus with other manifestations: Secondary | ICD-10-CM | POA: Diagnosis not present

## 2021-06-09 DIAGNOSIS — I5032 Chronic diastolic (congestive) heart failure: Secondary | ICD-10-CM | POA: Diagnosis not present

## 2021-06-09 DIAGNOSIS — I110 Hypertensive heart disease with heart failure: Secondary | ICD-10-CM | POA: Diagnosis not present

## 2021-06-10 ENCOUNTER — Other Ambulatory Visit: Payer: Self-pay | Admitting: *Deleted

## 2021-06-10 DIAGNOSIS — I480 Paroxysmal atrial fibrillation: Secondary | ICD-10-CM

## 2021-06-10 DIAGNOSIS — I5032 Chronic diastolic (congestive) heart failure: Secondary | ICD-10-CM

## 2021-06-10 DIAGNOSIS — I10 Essential (primary) hypertension: Secondary | ICD-10-CM

## 2021-06-10 MED ORDER — SPIRONOLACTONE 25 MG PO TABS: 25.0000 mg | ORAL_TABLET | Freq: Every day | ORAL | 3 refills | Status: DC

## 2021-06-13 ENCOUNTER — Other Ambulatory Visit: Payer: Self-pay | Admitting: *Deleted

## 2021-06-13 DIAGNOSIS — I110 Hypertensive heart disease with heart failure: Secondary | ICD-10-CM | POA: Diagnosis not present

## 2021-06-13 DIAGNOSIS — J9601 Acute respiratory failure with hypoxia: Secondary | ICD-10-CM | POA: Diagnosis not present

## 2021-06-13 DIAGNOSIS — I480 Paroxysmal atrial fibrillation: Secondary | ICD-10-CM

## 2021-06-13 DIAGNOSIS — J690 Pneumonitis due to inhalation of food and vomit: Secondary | ICD-10-CM | POA: Diagnosis not present

## 2021-06-13 DIAGNOSIS — I5032 Chronic diastolic (congestive) heart failure: Secondary | ICD-10-CM | POA: Diagnosis not present

## 2021-06-13 DIAGNOSIS — E871 Hypo-osmolality and hyponatremia: Secondary | ICD-10-CM | POA: Diagnosis not present

## 2021-06-13 DIAGNOSIS — J1089 Influenza due to other identified influenza virus with other manifestations: Secondary | ICD-10-CM | POA: Diagnosis not present

## 2021-06-15 DIAGNOSIS — J9601 Acute respiratory failure with hypoxia: Secondary | ICD-10-CM | POA: Diagnosis not present

## 2021-06-15 DIAGNOSIS — I5032 Chronic diastolic (congestive) heart failure: Secondary | ICD-10-CM | POA: Diagnosis not present

## 2021-06-15 DIAGNOSIS — E871 Hypo-osmolality and hyponatremia: Secondary | ICD-10-CM | POA: Diagnosis not present

## 2021-06-15 DIAGNOSIS — J1089 Influenza due to other identified influenza virus with other manifestations: Secondary | ICD-10-CM | POA: Diagnosis not present

## 2021-06-15 DIAGNOSIS — I110 Hypertensive heart disease with heart failure: Secondary | ICD-10-CM | POA: Diagnosis not present

## 2021-06-15 DIAGNOSIS — J690 Pneumonitis due to inhalation of food and vomit: Secondary | ICD-10-CM | POA: Diagnosis not present

## 2021-06-16 ENCOUNTER — Other Ambulatory Visit: Payer: Self-pay

## 2021-06-16 ENCOUNTER — Other Ambulatory Visit (INDEPENDENT_AMBULATORY_CARE_PROVIDER_SITE_OTHER): Payer: Medicare Other

## 2021-06-16 DIAGNOSIS — J1089 Influenza due to other identified influenza virus with other manifestations: Secondary | ICD-10-CM | POA: Diagnosis not present

## 2021-06-16 DIAGNOSIS — J690 Pneumonitis due to inhalation of food and vomit: Secondary | ICD-10-CM | POA: Diagnosis not present

## 2021-06-16 DIAGNOSIS — I5032 Chronic diastolic (congestive) heart failure: Secondary | ICD-10-CM

## 2021-06-16 DIAGNOSIS — J9601 Acute respiratory failure with hypoxia: Secondary | ICD-10-CM | POA: Diagnosis not present

## 2021-06-16 DIAGNOSIS — I480 Paroxysmal atrial fibrillation: Secondary | ICD-10-CM | POA: Diagnosis not present

## 2021-06-16 DIAGNOSIS — E871 Hypo-osmolality and hyponatremia: Secondary | ICD-10-CM | POA: Diagnosis not present

## 2021-06-16 DIAGNOSIS — I110 Hypertensive heart disease with heart failure: Secondary | ICD-10-CM | POA: Diagnosis not present

## 2021-06-17 LAB — BASIC METABOLIC PANEL
BUN/Creatinine Ratio: 23 (ref 12–28)
BUN: 19 mg/dL (ref 10–36)
CO2: 22 mmol/L (ref 20–29)
Calcium: 9.5 mg/dL (ref 8.7–10.3)
Chloride: 102 mmol/L (ref 96–106)
Creatinine, Ser: 0.81 mg/dL (ref 0.57–1.00)
Glucose: 94 mg/dL (ref 70–99)
Potassium: 4.3 mmol/L (ref 3.5–5.2)
Sodium: 137 mmol/L (ref 134–144)
eGFR: 67 mL/min/{1.73_m2} (ref >59)

## 2021-06-18 DIAGNOSIS — I959 Hypotension, unspecified: Secondary | ICD-10-CM | POA: Diagnosis not present

## 2021-06-20 DIAGNOSIS — C9190 Lymphoid leukemia, unspecified not having achieved remission: Secondary | ICD-10-CM | POA: Diagnosis not present

## 2021-06-20 DIAGNOSIS — E871 Hypo-osmolality and hyponatremia: Secondary | ICD-10-CM | POA: Diagnosis not present

## 2021-06-20 DIAGNOSIS — I10 Essential (primary) hypertension: Secondary | ICD-10-CM | POA: Diagnosis not present

## 2021-06-21 DIAGNOSIS — E871 Hypo-osmolality and hyponatremia: Secondary | ICD-10-CM | POA: Diagnosis not present

## 2021-06-21 DIAGNOSIS — J690 Pneumonitis due to inhalation of food and vomit: Secondary | ICD-10-CM | POA: Diagnosis not present

## 2021-06-21 DIAGNOSIS — J9601 Acute respiratory failure with hypoxia: Secondary | ICD-10-CM | POA: Diagnosis not present

## 2021-06-21 DIAGNOSIS — I5032 Chronic diastolic (congestive) heart failure: Secondary | ICD-10-CM | POA: Diagnosis not present

## 2021-06-21 DIAGNOSIS — J1089 Influenza due to other identified influenza virus with other manifestations: Secondary | ICD-10-CM | POA: Diagnosis not present

## 2021-06-21 DIAGNOSIS — I110 Hypertensive heart disease with heart failure: Secondary | ICD-10-CM | POA: Diagnosis not present

## 2021-06-22 DIAGNOSIS — E871 Hypo-osmolality and hyponatremia: Secondary | ICD-10-CM | POA: Diagnosis not present

## 2021-06-22 DIAGNOSIS — I5032 Chronic diastolic (congestive) heart failure: Secondary | ICD-10-CM | POA: Diagnosis not present

## 2021-06-22 DIAGNOSIS — J690 Pneumonitis due to inhalation of food and vomit: Secondary | ICD-10-CM | POA: Diagnosis not present

## 2021-06-22 DIAGNOSIS — J1089 Influenza due to other identified influenza virus with other manifestations: Secondary | ICD-10-CM | POA: Diagnosis not present

## 2021-06-22 DIAGNOSIS — J9601 Acute respiratory failure with hypoxia: Secondary | ICD-10-CM | POA: Diagnosis not present

## 2021-06-22 DIAGNOSIS — I110 Hypertensive heart disease with heart failure: Secondary | ICD-10-CM | POA: Diagnosis not present

## 2021-06-24 DIAGNOSIS — J690 Pneumonitis due to inhalation of food and vomit: Secondary | ICD-10-CM | POA: Diagnosis not present

## 2021-06-24 DIAGNOSIS — I480 Paroxysmal atrial fibrillation: Secondary | ICD-10-CM | POA: Diagnosis not present

## 2021-06-24 DIAGNOSIS — E039 Hypothyroidism, unspecified: Secondary | ICD-10-CM | POA: Diagnosis not present

## 2021-06-24 DIAGNOSIS — E871 Hypo-osmolality and hyponatremia: Secondary | ICD-10-CM | POA: Diagnosis not present

## 2021-06-24 DIAGNOSIS — C9110 Chronic lymphocytic leukemia of B-cell type not having achieved remission: Secondary | ICD-10-CM | POA: Diagnosis not present

## 2021-06-24 DIAGNOSIS — E785 Hyperlipidemia, unspecified: Secondary | ICD-10-CM | POA: Diagnosis not present

## 2021-06-24 DIAGNOSIS — H269 Unspecified cataract: Secondary | ICD-10-CM | POA: Diagnosis not present

## 2021-06-24 DIAGNOSIS — Z7901 Long term (current) use of anticoagulants: Secondary | ICD-10-CM | POA: Diagnosis not present

## 2021-06-24 DIAGNOSIS — J9601 Acute respiratory failure with hypoxia: Secondary | ICD-10-CM | POA: Diagnosis not present

## 2021-06-24 DIAGNOSIS — H409 Unspecified glaucoma: Secondary | ICD-10-CM | POA: Diagnosis not present

## 2021-06-24 DIAGNOSIS — Z9181 History of falling: Secondary | ICD-10-CM | POA: Diagnosis not present

## 2021-06-24 DIAGNOSIS — R7301 Impaired fasting glucose: Secondary | ICD-10-CM | POA: Diagnosis not present

## 2021-06-24 DIAGNOSIS — J1089 Influenza due to other identified influenza virus with other manifestations: Secondary | ICD-10-CM | POA: Diagnosis not present

## 2021-06-24 DIAGNOSIS — I5032 Chronic diastolic (congestive) heart failure: Secondary | ICD-10-CM | POA: Diagnosis not present

## 2021-06-24 DIAGNOSIS — I110 Hypertensive heart disease with heart failure: Secondary | ICD-10-CM | POA: Diagnosis not present

## 2021-06-24 DIAGNOSIS — D630 Anemia in neoplastic disease: Secondary | ICD-10-CM | POA: Diagnosis not present

## 2021-06-24 DIAGNOSIS — M810 Age-related osteoporosis without current pathological fracture: Secondary | ICD-10-CM | POA: Diagnosis not present

## 2021-06-29 DIAGNOSIS — I110 Hypertensive heart disease with heart failure: Secondary | ICD-10-CM | POA: Diagnosis not present

## 2021-06-29 DIAGNOSIS — I5032 Chronic diastolic (congestive) heart failure: Secondary | ICD-10-CM | POA: Diagnosis not present

## 2021-06-29 DIAGNOSIS — J9601 Acute respiratory failure with hypoxia: Secondary | ICD-10-CM | POA: Diagnosis not present

## 2021-06-29 DIAGNOSIS — J690 Pneumonitis due to inhalation of food and vomit: Secondary | ICD-10-CM | POA: Diagnosis not present

## 2021-06-29 DIAGNOSIS — E871 Hypo-osmolality and hyponatremia: Secondary | ICD-10-CM | POA: Diagnosis not present

## 2021-06-29 DIAGNOSIS — J1089 Influenza due to other identified influenza virus with other manifestations: Secondary | ICD-10-CM | POA: Diagnosis not present

## 2021-07-01 DIAGNOSIS — J1089 Influenza due to other identified influenza virus with other manifestations: Secondary | ICD-10-CM | POA: Diagnosis not present

## 2021-07-01 DIAGNOSIS — I5032 Chronic diastolic (congestive) heart failure: Secondary | ICD-10-CM | POA: Diagnosis not present

## 2021-07-01 DIAGNOSIS — E871 Hypo-osmolality and hyponatremia: Secondary | ICD-10-CM | POA: Diagnosis not present

## 2021-07-01 DIAGNOSIS — J9601 Acute respiratory failure with hypoxia: Secondary | ICD-10-CM | POA: Diagnosis not present

## 2021-07-01 DIAGNOSIS — J690 Pneumonitis due to inhalation of food and vomit: Secondary | ICD-10-CM | POA: Diagnosis not present

## 2021-07-01 DIAGNOSIS — I110 Hypertensive heart disease with heart failure: Secondary | ICD-10-CM | POA: Diagnosis not present

## 2021-07-04 DIAGNOSIS — E871 Hypo-osmolality and hyponatremia: Secondary | ICD-10-CM | POA: Diagnosis not present

## 2021-07-04 DIAGNOSIS — I110 Hypertensive heart disease with heart failure: Secondary | ICD-10-CM | POA: Diagnosis not present

## 2021-07-04 DIAGNOSIS — J9601 Acute respiratory failure with hypoxia: Secondary | ICD-10-CM | POA: Diagnosis not present

## 2021-07-04 DIAGNOSIS — J1089 Influenza due to other identified influenza virus with other manifestations: Secondary | ICD-10-CM | POA: Diagnosis not present

## 2021-07-04 DIAGNOSIS — J690 Pneumonitis due to inhalation of food and vomit: Secondary | ICD-10-CM | POA: Diagnosis not present

## 2021-07-04 DIAGNOSIS — I5032 Chronic diastolic (congestive) heart failure: Secondary | ICD-10-CM | POA: Diagnosis not present

## 2021-07-05 ENCOUNTER — Ambulatory Visit
Admission: RE | Admit: 2021-07-05 | Discharge: 2021-07-05 | Disposition: A | Discharge status: Home / Self Care | Payer: Medicare Other | Attending: Unknown Physician Specialty | Admitting: Unknown Physician Specialty

## 2021-07-05 ENCOUNTER — Ambulatory Visit (INDEPENDENT_AMBULATORY_CARE_PROVIDER_SITE_OTHER): Payer: Medicare Other | Admitting: Unknown Physician Specialty

## 2021-07-05 ENCOUNTER — Encounter: Payer: Self-pay | Admitting: Unknown Physician Specialty

## 2021-07-05 ENCOUNTER — Ambulatory Visit
Admission: RE | Admit: 2021-07-05 | Discharge: 2021-07-05 | Disposition: A | Discharge status: Home / Self Care | Payer: Medicare Other | Source: Ambulatory Visit | Attending: Unknown Physician Specialty | Admitting: Unknown Physician Specialty

## 2021-07-05 DIAGNOSIS — I10 Essential (primary) hypertension: Secondary | ICD-10-CM | POA: Diagnosis not present

## 2021-07-05 DIAGNOSIS — E222 Syndrome of inappropriate secretion of antidiuretic hormone: Secondary | ICD-10-CM

## 2021-07-05 DIAGNOSIS — J690 Pneumonitis due to inhalation of food and vomit: Secondary | ICD-10-CM

## 2021-07-05 DIAGNOSIS — E039 Hypothyroidism, unspecified: Secondary | ICD-10-CM | POA: Diagnosis not present

## 2021-07-05 DIAGNOSIS — Z8701 Personal history of pneumonia (recurrent): Secondary | ICD-10-CM | POA: Diagnosis not present

## 2021-07-05 DIAGNOSIS — I700 Atherosclerosis of aorta: Secondary | ICD-10-CM | POA: Diagnosis not present

## 2021-07-05 MED ORDER — LEVOTHYROXINE SODIUM 88 MCG PO TABS: ORAL_TABLET | ORAL | 2 refills | Status: DC

## 2021-07-05 MED ORDER — LEVOTHYROXINE SODIUM 75 MCG PO TABS: ORAL_TABLET | ORAL | 2 refills | Status: DC

## 2021-07-05 NOTE — Assessment & Plan Note (Signed)
Euthyroid labs done 9/22.  Will refill medictions

## 2021-07-05 NOTE — Progress Notes (Signed)
BP 134/64    Pulse 60    Temp 97.6 F (36.4 C) (Oral)    Resp 16    Ht '5\' 2"'  (1.575 m)    Wt 138 lb 6.4 oz (62.8 kg)    LMP  (LMP Unknown)    SpO2 96%    BMI 25.31 kg/m    Subjective:    Patient ID: Sydney Mcdaniel, female    DOB: [birth year], 88 y.o.   MRN: [id number]  HPI: RON JUNCO is a 88 y.o. female  Chief Complaint  Patient presents with   Follow-up   Hypertension    Pt states BP has been much well controlled.   Hyperlipidemia   Most recent admission to the hospital discharged on 11/28 for aspiration pneumonia and flu.  Hospital notes reviewed.  No f/u chest x-ray noted though aspiration pneumonia noted not on x-ray but on on CT.   Saw cardiology on 12/7 for multi-vessel cardiac ischemia.  Restarted atorvastatin.  Lisinopril titrated up.  CBC and CMP done showing normal kidney function, electrolytes and stable chronic normocyttic anemia.  Saw Nephrology who recommended fluid restriction for SIADH  CC today is concern about choking on food in the setting of aspiration pneumonia while in hospital.    Pt is with daughter who reports that PT and OT are coming out.    Needs refill of thyroid meds.  Last throid panel September and euthryroid    Relevant past medical, surgical, family and social history reviewed and updated as indicated. Interim medical history since our last visit reviewed. Allergies and medications reviewed and updated.  Review of Systems  Constitutional:  Negative for chills, diaphoresis and fatigue.       Mostly sedentary  Respiratory: Negative.    Cardiovascular: Negative.   Skin: Negative.   Psychiatric/Behavioral: Negative.     Per HPI unless specifically indicated above     Objective:    BP 134/64    Pulse 60    Temp 97.6 F (36.4 C) (Oral)    Resp 16    Ht '5\' 2"'  (1.575 m)    Wt 138 lb 6.4 oz (62.8 kg)    LMP  (LMP Unknown)    SpO2 96%    BMI 25.31 kg/m   Wt Readings from Last 3 Encounters:  07/05/21 138 lb 6.4 oz (62.8 kg)  06/01/21  138 lb (62.6 kg)  05/20/21 146 lb 2.6 oz (66.3 kg)    Physical Exam Constitutional:      General: She is not in acute distress.    Appearance: Normal appearance. She is well-developed.  HENT:     Head: Normocephalic and atraumatic.  Eyes:     General: Lids are normal. No scleral icterus.       Right eye: No discharge.        Left eye: No discharge.     Conjunctiva/sclera: Conjunctivae normal.  Neck:     Vascular: No carotid bruit or JVD.  Cardiovascular:     Rate and Rhythm: Normal rate and regular rhythm.     Heart sounds: Normal heart sounds.  Pulmonary:     Effort: Pulmonary effort is normal. No respiratory distress.     Breath sounds: Normal breath sounds.  Abdominal:     Palpations: There is no hepatomegaly or splenomegaly.  Musculoskeletal:        General: Normal range of motion.     Cervical back: Normal range of motion and neck supple.  Skin:  General: Skin is warm and dry.     Coloration: Skin is not pale.     Findings: No rash.  Neurological:     Mental Status: She is alert and oriented to person, place, and time.  Psychiatric:        Behavior: Behavior normal.        Thought Content: Thought content normal.        Judgment: Judgment normal.    Results for orders placed or performed in visit on 23/34/35  Basic metabolic panel  Result Value Ref Range   Glucose 94 70 - 99 mg/dL   BUN 19 10 - 36 mg/dL   Creatinine, Ser 0.81 0.57 - 1.00 mg/dL   eGFR 67 >59 mL/min/1.73   BUN/Creatinine Ratio 23 12 - 28   Sodium 137 134 - 144 mmol/L   Potassium 4.3 3.5 - 5.2 mmol/L   Chloride 102 96 - 106 mmol/L   CO2 22 20 - 29 mmol/L   Calcium 9.5 8.7 - 10.3 mg/dL      Assessment & Plan:   Problem List Items Addressed This Visit       Unprioritized   Aspiration pneumonia ([place])    Resolving.  F/U chest x-ray.  Will order speech therapy evaluation      Relevant Orders   Ambulatory referral to Speech Therapy   DG Chest 2 View   Essential hypertension    BP  labile historically.  Cardiology is managing      Relevant Medications   atorvastatin (LIPITOR) 20 MG tablet   Hypothyroidism    Euthyroid labs done 9/22.  Will refill medictions      Relevant Medications   levothyroxine (EUTHYROX) 88 MCG tablet   levothyroxine (EUTHYROX) 75 MCG tablet   SIADH (syndrome of inappropriate ADH production) (HCC)    On fluid restriction per nephrology.  Seems to be working well.  Sodium 12/22 is stable.          Follow up plan: Return in about 3 months (around 10/03/2021).

## 2021-07-05 NOTE — Assessment & Plan Note (Addendum)
On fluid restriction per nephrology.  Seems to be working well.  Sodium 12/22 is stable.

## 2021-07-05 NOTE — Assessment & Plan Note (Signed)
BP labile historically.  Cardiology is managing

## 2021-07-05 NOTE — Assessment & Plan Note (Signed)
Resolving.  F/U chest x-ray.  Will order speech therapy evaluation

## 2021-07-06 DIAGNOSIS — J9601 Acute respiratory failure with hypoxia: Secondary | ICD-10-CM | POA: Diagnosis not present

## 2021-07-06 DIAGNOSIS — J1089 Influenza due to other identified influenza virus with other manifestations: Secondary | ICD-10-CM | POA: Diagnosis not present

## 2021-07-06 DIAGNOSIS — J690 Pneumonitis due to inhalation of food and vomit: Secondary | ICD-10-CM | POA: Diagnosis not present

## 2021-07-06 DIAGNOSIS — I5032 Chronic diastolic (congestive) heart failure: Secondary | ICD-10-CM | POA: Diagnosis not present

## 2021-07-06 DIAGNOSIS — E871 Hypo-osmolality and hyponatremia: Secondary | ICD-10-CM | POA: Diagnosis not present

## 2021-07-06 DIAGNOSIS — I110 Hypertensive heart disease with heart failure: Secondary | ICD-10-CM | POA: Diagnosis not present

## 2021-07-07 DIAGNOSIS — H401131 Primary open-angle glaucoma, bilateral, mild stage: Secondary | ICD-10-CM | POA: Diagnosis not present

## 2021-07-07 DIAGNOSIS — H04129 Dry eye syndrome of unspecified lacrimal gland: Secondary | ICD-10-CM | POA: Diagnosis not present

## 2021-07-08 ENCOUNTER — Other Ambulatory Visit: Payer: Self-pay

## 2021-07-08 ENCOUNTER — Ambulatory Visit: Payer: Medicare Other | Attending: Unknown Physician Specialty | Admitting: Speech Pathology

## 2021-07-08 DIAGNOSIS — R1312 Dysphagia, oropharyngeal phase: Secondary | ICD-10-CM | POA: Diagnosis not present

## 2021-07-08 NOTE — Therapy (Signed)
[place] MAIN [facility] SERVICES [address] [place], [place], [address] Phone: [phone]   Fax:  [phone]  Speech Language Pathology Evaluation CLINICAL SWALLOW EVALUATION   Patient Details  Name: Sydney Mcdaniel MRN: [id number] Date of Birth: 1925/06/21 Referring Provider (SLP): Kathrine Haddock   Encounter Date: 07/08/2021   End of Session - 07/08/21 1216     Visit Number 1    Number of Visits 1    SLP Start Time 6073    SLP Stop Time  1135    SLP Time Calculation (min) 30 min    Activity Tolerance Patient tolerated treatment well             Past Medical History:  Diagnosis Date   Allergy    Anemia    Carotid arterial disease ([place])    a. 04/2020 Carotis U/S: 1-39% bilat ICA stenosis, <50% bilat ECA stenosis ->f/u 1 yr.   Cataract    CLL (chronic lymphocytic leukemia) (HCC)    Diastolic dysfunction    a. 02/2019 Echo: EF 60-65%, no rwma, doppler parameters consistent w/ pseudonormalization. Nl RV size/fxn.   GERD (gastroesophageal reflux disease)    Glaucoma    H/O: hysterectomy    Total   History of stress test    a. 02/2019 MV: EF 60%, no ischemia/infarct.   Hyperlipidemia    Hypertension    Hypothyroidism    Impaired fasting glucose    Lichen sclerosus    Osteoporosis    Hips   PAF (paroxysmal atrial fibrillation) ([place])    a. 06/15/2016 Event monitor: 4% afib burden; b. CHA2DS2VASc - 4-->Xarelto.   Sinus bradycardia    a. avoid AVN blocking agents.   Syncope    a. 03/2016 Echo: EF 55-60%, no rwma, mild AI/MR, nl PASP; b. 03/2016 48h Holter: no significant arrhythmias/pauses; c. 03/2016 MV: mild apical defect, likely breast attenuation, nl EF, low risk; d. 05/2016 Event monitor: No significant arrhythmia; e. 05/2016 Event monitor: PAF (4%).    Past Surgical History:  Procedure Laterality Date   APPENDECTOMY     EYE SURGERY     Glaucoma   MULTIPLE TOOTH EXTRACTIONS N/A    2 teeth   TOTAL ABDOMINAL  HYSTERECTOMY      There were no vitals filed for this visit.   Subjective Assessment - 07/08/21 1214     Subjective pt pleasant, good historian, accompanied by her daughter    Patient is accompained by: Family member    Currently in Pain? No/denies                SLP Evaluation [facility] - 07/08/21 1214       SLP Visit Information   SLP Received On 07/08/21    Referring Provider (SLP) Kathrine Haddock    Onset Date 05/20/2021    Medical Diagnosis Concern for Aspiration Pneumonia      General Information   HPI Pt presents for a clinical swallow evaluation d/t episode of aspiration pneumonia while hospitalized in May 17, 2021. Most recent chest x-ray (07/05/2021) was negative for any acute cardiopulmonary disease. Pt is consuming a regular diet with thin liquids via straw and medicines whole with thin liquids at home. Pt's daughter reports intermittent coughing at supper when distractions are present. Pt doesn't have any history of respiratory illness.    Behavioral/Cognition appropriate    Mobility Status arrived in staxxi chair      Cognition   Overall Cognitive Status Within Functional Limits for tasks  assessed      Oral Motor/Sensory Function   Overall Oral Motor/Sensory Function Appears within functional limits for tasks assessed      Standardized Assessments   Standardized Assessments  --   Clinical Swallow Evaluation                            SLP Education - 07/08/21 1216     Education Details general aspiration precautions    Person(s) Educated Patient;Child(ren)    Methods Explanation;Handout    Comprehension Verbalized understanding;Returned demonstration                  Plan - 07/08/21 1217     Clinical Impression Statement Pt presents with adequate oropharyngeal abilities when consuming thin liquids via straw and graham crackers. Thus is appears at reduced risk of developing aspiration pneumonia. Extensive education provided to  pt and her daughter on general aspiration precautions and s/s of aspiration. All questions were answered to their satisfaction. At this time will defer further instrumental assessment of her swallowing. They were in agreement with this plan. No further services are indicated at this time.    Consulted and Agree with Plan of Care Patient;Family member/caregiver    Family Member Consulted pt's daughter            Problem List Patient Active Problem List   Diagnosis Date Noted   SIADH (syndrome of inappropriate ADH production) ([place]) 07/05/2021   Aspiration pneumonia ([place]) 05/21/2021   Elevated troponin 05/20/2021   Fall 02/11/2021   Normocytic anemia 05/31/2020   Goals of care, counseling/discussion 03/08/2020   Positive direct antiglobulin test (DAT) 12/04/2019   Sinus bradycardia 04/09/2019   First degree AV block 04/09/2019   Osteoporosis 08/21/2018   Bilateral carotid artery stenosis 04/19/2018   History of syncope 10/17/2017   MCI (mild cognitive impairment) 08/12/2017   Lung nodules 07/09/2017   Paroxysmal atrial fibrillation ([place]) 04/18/2017   Hyponatremia 10/20/2016   Chronic diastolic congestive heart failure ([place]) 06/28/2016   Primary open-angle glaucoma, bilateral, mild stage 05/08/2016   Bladder prolapse, female, acquired 02/25/2016   Asymptomatic bacteriuria 02/25/2016   B12 deficiency 01/11/2016   Anemia 01/10/2016   CLL (chronic lymphocytic leukemia) ([place]) 01/10/2016   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 12/21/2015   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 12/21/2015   Bradycardia 12/21/2015   Lymphocytosis 06/25/2015   Impaired fasting glucose 06/22/2015   Carotid atherosclerosis 06/22/2015   Essential hypertension 12/22/2014   Hyperlipidemia 12/22/2014   Hypothyroidism 12/22/2014   Tatem Fesler B. Rutherford Nail M.S., CCC-SLP, [facility] Pathologist Rehabilitation Services Office [place], [place] 07/08/2021, 12:18  PM  [place] MAIN [facility] SERVICES [address] [place], [place], [address] Phone: [phone]   Fax:  [phone]  Name: Sydney Mcdaniel MRN: [id number] Date of Birth: 06-Sep-1924

## 2021-07-13 ENCOUNTER — Ambulatory Visit (INDEPENDENT_AMBULATORY_CARE_PROVIDER_SITE_OTHER): Payer: Medicare Other

## 2021-07-13 ENCOUNTER — Other Ambulatory Visit: Payer: Self-pay

## 2021-07-13 ENCOUNTER — Telehealth: Payer: Self-pay

## 2021-07-13 DIAGNOSIS — E871 Hypo-osmolality and hyponatremia: Secondary | ICD-10-CM | POA: Diagnosis not present

## 2021-07-13 DIAGNOSIS — I110 Hypertensive heart disease with heart failure: Secondary | ICD-10-CM | POA: Diagnosis not present

## 2021-07-13 DIAGNOSIS — J690 Pneumonitis due to inhalation of food and vomit: Secondary | ICD-10-CM | POA: Diagnosis not present

## 2021-07-13 DIAGNOSIS — I6523 Occlusion and stenosis of bilateral carotid arteries: Secondary | ICD-10-CM

## 2021-07-13 DIAGNOSIS — J1089 Influenza due to other identified influenza virus with other manifestations: Secondary | ICD-10-CM | POA: Diagnosis not present

## 2021-07-13 DIAGNOSIS — I5032 Chronic diastolic (congestive) heart failure: Secondary | ICD-10-CM | POA: Diagnosis not present

## 2021-07-13 DIAGNOSIS — J9601 Acute respiratory failure with hypoxia: Secondary | ICD-10-CM | POA: Diagnosis not present

## 2021-07-13 LAB — ECHOCARDIOGRAM COMPLETE
AR max vel: 2.01 cm2
AV Area VTI: 1.86 cm2
AV Area mean vel: 2.04 cm2
AV Mean grad: 4 mmHg
AV Peak grad: 9.1 mmHg
Ao pk vel: 1.51 m/s
Area-P 1/2: 3.16 cm2
Calc EF: 50.2 %
S' Lateral: 3.4 cm
Single Plane A2C EF: 50.1 %
Single Plane A4C EF: 50.2 %

## 2021-07-13 NOTE — Telephone Encounter (Signed)
Copied from [place] [phone]. Topic: General - Inquiry >> Jul 13, 2021  3:33 PM Oneta Rack wrote: Osvaldo Human name: [facility]  Relation to pt: Clinical Manager from [employer]  Call back number: [phone] option # 2 fax # [phone]    Reason for call:  Checking on the status of verbal orders, requested since  06/09/2021  for OT orders 1x 7. Caller would like a follow up call as soon as possible.

## 2021-07-13 NOTE — Telephone Encounter (Signed)
Called and given

## 2021-07-13 NOTE — Telephone Encounter (Signed)
Okay to give. 

## 2021-07-14 DIAGNOSIS — I5032 Chronic diastolic (congestive) heart failure: Secondary | ICD-10-CM | POA: Diagnosis not present

## 2021-07-14 DIAGNOSIS — J1089 Influenza due to other identified influenza virus with other manifestations: Secondary | ICD-10-CM | POA: Diagnosis not present

## 2021-07-14 DIAGNOSIS — E871 Hypo-osmolality and hyponatremia: Secondary | ICD-10-CM | POA: Diagnosis not present

## 2021-07-14 DIAGNOSIS — I110 Hypertensive heart disease with heart failure: Secondary | ICD-10-CM | POA: Diagnosis not present

## 2021-07-14 DIAGNOSIS — J690 Pneumonitis due to inhalation of food and vomit: Secondary | ICD-10-CM | POA: Diagnosis not present

## 2021-07-14 DIAGNOSIS — J9601 Acute respiratory failure with hypoxia: Secondary | ICD-10-CM | POA: Diagnosis not present

## 2021-07-19 DIAGNOSIS — I110 Hypertensive heart disease with heart failure: Secondary | ICD-10-CM | POA: Diagnosis not present

## 2021-07-19 DIAGNOSIS — J9601 Acute respiratory failure with hypoxia: Secondary | ICD-10-CM | POA: Diagnosis not present

## 2021-07-19 DIAGNOSIS — I5032 Chronic diastolic (congestive) heart failure: Secondary | ICD-10-CM | POA: Diagnosis not present

## 2021-07-19 DIAGNOSIS — E871 Hypo-osmolality and hyponatremia: Secondary | ICD-10-CM | POA: Diagnosis not present

## 2021-07-19 DIAGNOSIS — J1089 Influenza due to other identified influenza virus with other manifestations: Secondary | ICD-10-CM | POA: Diagnosis not present

## 2021-07-19 DIAGNOSIS — J690 Pneumonitis due to inhalation of food and vomit: Secondary | ICD-10-CM | POA: Diagnosis not present

## 2021-07-20 DIAGNOSIS — J690 Pneumonitis due to inhalation of food and vomit: Secondary | ICD-10-CM | POA: Diagnosis not present

## 2021-07-20 DIAGNOSIS — E871 Hypo-osmolality and hyponatremia: Secondary | ICD-10-CM | POA: Diagnosis not present

## 2021-07-20 DIAGNOSIS — J9601 Acute respiratory failure with hypoxia: Secondary | ICD-10-CM | POA: Diagnosis not present

## 2021-07-20 DIAGNOSIS — I110 Hypertensive heart disease with heart failure: Secondary | ICD-10-CM | POA: Diagnosis not present

## 2021-07-20 DIAGNOSIS — I5032 Chronic diastolic (congestive) heart failure: Secondary | ICD-10-CM | POA: Diagnosis not present

## 2021-07-20 DIAGNOSIS — J1089 Influenza due to other identified influenza virus with other manifestations: Secondary | ICD-10-CM | POA: Diagnosis not present

## 2021-07-21 ENCOUNTER — Ambulatory Visit (INDEPENDENT_AMBULATORY_CARE_PROVIDER_SITE_OTHER): Payer: Medicare Other

## 2021-07-21 DIAGNOSIS — Z0000 Encounter for general adult medical examination without abnormal findings: Secondary | ICD-10-CM

## 2021-07-21 NOTE — Progress Notes (Signed)
Subjective:   Sydney Mcdaniel is a 88 y.o. female who presents for Medicare Annual (Subsequent) preventive examination.  Virtual Visit via Telephone Note  I connected with  Sydney Mcdaniel on 07/21/21 at 10:40 AM EST by telephone and verified that I am speaking with the correct person using two identifiers.  Location: Patient: home Provider: [place] Persons participating in the virtual visit: patient & daughter Sydney Mcdaniel Health Advisor   I discussed the limitations, risks, security and privacy concerns of performing an evaluation and management service by telephone and the availability of in person appointments. The patient expressed understanding and agreed to proceed.  Interactive audio and video telecommunications were attempted between this nurse and patient, however failed, due to patient having technical difficulties OR patient did not have access to video capability.  We continued and completed visit with audio only.  Some vital signs may be absent or patient reported.   Clemetine Marker, LPN   Review of Systems     Cardiac Risk Factors include: dyslipidemia;hypertension;sedentary lifestyle     Objective:    There were no vitals filed for this visit. There is no height or weight on file to calculate BMI.  Advanced Directives 07/21/2021 05/20/2021 03/29/2021 03/28/2021 02/11/2021 10/18/2020 09/26/2020  Does Patient Have a Medical Advance Directive? Yes Yes Yes Yes Yes Yes No  Type of Paramedic of [place];Living will [place];Living will [place]  Does patient want to make changes to medical advance directive? - No - Patient declined No - Patient declined - No - Patient declined Yes (ED - Information included in AVS) -  Copy of [place] in Chart? Yes - validated most recent copy scanned in chart (See row information) No - copy requested Yes - validated most recent copy scanned in chart (See  row information) - - - -  Would patient like information on creating a medical advance directive? - - - - - - -    Current Medications (verified) Outpatient Encounter Medications as of 07/21/2021  Medication Sig   acetaminophen (TYLENOL) 500 MG tablet Take 1,000 mg by mouth every 8 (eight) hours as needed.   alendronate (FOSAMAX) 70 MG tablet TAKE 1 TABLET BY MOUTH ONCE A WEEK WITH A FULL GLASS OF WATER ON AN EMPTY STOMACH (Patient taking differently: Take 70 mg by mouth once a week. Takes on Fridays)   amLODipine (NORVASC) 5 MG tablet Take 5 mg by mouth daily.   atorvastatin (LIPITOR) 20 MG tablet Take by mouth.   ferrous sulfate 325 (65 FE) MG tablet Take 1 tablet (325 mg total) by mouth daily with breakfast.   latanoprost (XALATAN) 0.005 % ophthalmic solution Place 1 drop into the right eye at bedtime.    levothyroxine (EUTHYROX) 75 MCG tablet TAKE ONE TABLET BY MOUTH ON MONDAYS, WEDNESDAYS, FRIDAYS, AND SUNDAYS   levothyroxine (EUTHYROX) 88 MCG tablet TAKE 1 TABLET BY MOUTH ON TUESDAYS, THURSDAYS, AND SATURDAYS   lisinopril (ZESTRIL) 40 MG tablet Take 1 tablet (40 mg total) by mouth daily.   Netarsudil Dimesylate (RHOPRESSA) 0.02 % SOLN Place 1 drop into the right eye at bedtime.   polyethylene glycol (MIRALAX / GLYCOLAX) 17 g packet Take 17 g by mouth daily.   Rivaroxaban (XARELTO) 15 MG TABS tablet Take 1 tablet (15 mg total) by mouth daily with supper.   senna (SENOKOT) 8.6 MG tablet Take 1 tablet by mouth as needed. Uses only when miralax  does not work.   spironolactone (ALDACTONE) 25 MG tablet Take 1 tablet (25 mg total) by mouth daily.   timolol (TIMOPTIC) 0.5 % ophthalmic solution Place 1 drop into the right eye 2 (two) times daily. Instill 1 drop in the morning and 1 drop at 1700   vitamin B-12 (CYANOCOBALAMIN) 100 MCG tablet Take 100 mcg by mouth daily.   No facility-administered encounter medications on file as of 07/21/2021.    Allergies (verified) Other, Alphagan  [brimonidine], Pravachol [pravastatin sodium], and Pravastatin   History: Past Medical History:  Diagnosis Date   Allergy    Anemia    Carotid arterial disease ([place])    a. 04/2020 Carotis U/S: 1-39% bilat ICA stenosis, <50% bilat ECA stenosis ->f/u 1 yr.   Cataract    CLL (chronic lymphocytic leukemia) (HCC)    Diastolic dysfunction    a. 02/2019 Echo: EF 60-65%, no rwma, doppler parameters consistent w/ pseudonormalization. Nl RV size/fxn.   GERD (gastroesophageal reflux disease)    Glaucoma    H/O: hysterectomy    Total   History of stress test    a. 02/2019 MV: EF 60%, no ischemia/infarct.   Hyperlipidemia    Hypertension    Hypothyroidism    Impaired fasting glucose    Lichen sclerosus    Osteoporosis    Hips   PAF (paroxysmal atrial fibrillation) ([place])    a. 06/15/2016 Event monitor: 4% afib burden; b. CHA2DS2VASc - 4-->Xarelto.   Sinus bradycardia    a. avoid AVN blocking agents.   Syncope    a. 03/2016 Echo: EF 55-60%, no rwma, mild AI/MR, nl PASP; b. 03/2016 48h Holter: no significant arrhythmias/pauses; c. 03/2016 MV: mild apical defect, likely breast attenuation, nl EF, low risk; d. 05/2016 Event monitor: No significant arrhythmia; e. 05/2016 Event monitor: PAF (4%).   Past Surgical History:  Procedure Laterality Date   APPENDECTOMY     EYE SURGERY     Glaucoma   MULTIPLE TOOTH EXTRACTIONS N/A    2 teeth   TOTAL ABDOMINAL HYSTERECTOMY     Family History  Problem Relation Age of Onset   Heart attack Mother    Glaucoma Mother    Heart attack Father    Parkinson's disease Brother    Heart attack Brother    Social History   Socioeconomic History   Marital status: Widowed    Spouse name: Not on file   Number of children: 2   Years of education: Not on file   Highest education level: High school graduate  Occupational History   Occupation: retired  Tobacco Use   Smoking status: Never   Smokeless tobacco: Never  Vaping Use   Vaping Use: Never used   Substance and Sexual Activity   Alcohol use: No   Drug use: No   Sexual activity: Not Currently  Other Topics Concern   Not on file  Social History Narrative   Pt lives alone. Daughter Sydney Glad close by   Social Determinants of Radio broadcast assistant Strain: Low Risk    Difficulty of Paying Living Expenses: Not hard at all  Food Insecurity: No Food Insecurity   Worried About Charity fundraiser in the Last Year: Never true   Arboriculturist in the Last Year: Never true  Transportation Needs: No Transportation Needs   Lack of Transportation (Medical): No   Lack of Transportation (Non-Medical): No  Physical Activity: Inactive   Days of Exercise per Week: 0 days   Minutes  of Exercise per Session: 0 min  Stress: No Stress Concern Present   Feeling of Stress : Not at all  Social Connections: Moderately Isolated   Frequency of Communication with Friends and Family: More than three times a week   Frequency of Social Gatherings with Friends and Family: More than three times a week   Attends Religious Services: More than 4 times per year   Active Member of [employer] or Organizations: No   Attends Archivist Meetings: Never   Marital Status: Widowed    Tobacco Counseling Counseling given: Not Answered   Clinical Intake:  Pre-visit preparation completed: Yes  Pain : No/denies pain     Nutritional Risks: None Diabetes: No  How often do you need to have someone help you when you read instructions, pamphlets, or other written materials from your doctor or pharmacy?: 1 - Never    Interpreter Needed?: No  Information entered by :: Clemetine Marker LPN   Activities of Daily Living In your present state of health, do you have any difficulty performing the following activities: 07/21/2021 07/05/2021  Hearing? N N  Vision? N N  Difficulty concentrating or making decisions? N N  Walking or climbing stairs? Y Y  Dressing or bathing? Y Y  Doing errands, shopping? Y Y   Using the Toilet? N -  In the past six months, have you accidently leaked urine? N -  Do you have problems with loss of bowel control? Y -  Comment wears pads for protection -  Managing your Medications? Y -  Managing your Finances? Y -  Housekeeping or managing your Housekeeping? N -  Some recent data might be hidden    Patient Care Team: Teodora Medici, DO as PCP - General (Internal Medicine) Anell Barr, OD (Optometry) End, Harrell Gave, MD as Consulting Physician (Cardiology) Anthonette Legato, MD (Nephrology)  Indicate any recent Medical Services you may have received from other than [facility] providers in the past year (date may be approximate).     Assessment:   This is a routine wellness examination for [place].  Hearing/Vision screen Hearing Screening - Comments:: Pt denies hearing difficulty Vision Screening - Comments:: Annual vision screenings with Dr. Ellin Mayhew in [facility]  Dietary issues and exercise activities discussed: Current Exercise Habits: The patient does not participate in regular exercise at present, Exercise limited by: orthopedic condition(s)   Goals Addressed             This Visit's Progress    Prevent falls   On track    Continue physical therapy to increase balance and strengthening exercises       Depression Screen PHQ 2/9 Scores 07/21/2021 07/05/2021 05/05/2021 04/04/2021 03/17/2021 03/03/2021 01/06/2021  PHQ - 2 Score 0 0 0 0 0 0 0  PHQ- 9 Score - 0 0 0 0 0 -    Fall Risk Fall Risk  07/21/2021 07/05/2021 05/05/2021 04/04/2021 03/17/2021  Falls in the past year? 1 1 1 1 1   Number falls in past yr: 0 0 0 0 1  Comment - - - - -  Injury with Fall? 0 0 0 0 0  Comment - - - - -  Risk Factor Category  - - - - -  Risk for fall due to : Impaired balance/gait;History of fall(s) Impaired balance/gait Impaired balance/gait - Impaired balance/gait;Impaired mobility  Follow up Falls prevention discussed Falls prevention discussed Falls prevention  discussed - -    FALL RISK PREVENTION PERTAINING TO THE HOME:  Any  stairs in or around the home? Yes  If so, are there any without handrails? No  Home free of loose throw rugs in walkways, pet beds, electrical cords, etc? Yes  Adequate lighting in your home to reduce risk of falls? Yes   ASSISTIVE DEVICES UTILIZED TO PREVENT FALLS:  Life alert? Yes  Use of a cane, walker or w/c? Yes  Grab bars in the bathroom? Yes  Shower chair or bench in shower? Yes  Elevated toilet seat or a handicapped toilet? No   TIMED UP AND GO:  Was the test performed? No . Telephonic visit  Cognitive Function: Normal cognitive status assessed by direct observation by this Nurse Health Advisor. No abnormalities found.       6CIT Screen 07/20/2020 07/08/2019 07/02/2018 06/27/2017  What Year? 0 points 0 points 0 points 0 points  What month? 0 points 0 points 0 points 0 points  What time? 0 points 0 points 0 points 0 points  Count back from 20 0 points 0 points 0 points 2 points  Months in reverse 0 points 0 points 0 points 0 points  Repeat phrase 4 points 4 points 2 points 6 points  Total Score 4 4 2 8     Immunizations Immunization History  Administered Date(s) Administered   Fluad Quad(high Dose 65+) 03/17/2021   Influenza, High Dose Seasonal PF 03/22/2016, 03/24/2017, 04/01/2018, 03/18/2019, 03/11/2020   Influenza,inj,Quad PF,6+ Mos 04/13/2015   Influenza-Unspecified 04/11/2012, 03/10/2014, 03/24/2017   PFIZER(Purple Top)SARS-COV-2 Vaccination 07/25/2019, 08/15/2019, 03/29/2020, 05/31/2021   Pneumococcal Conjugate-13 12/10/2013   Pneumococcal Polysaccharide-23 04/13/2008   Td 01/22/2004   Tdap 06/27/2017   Zoster, Live 04/10/2006    TDAP status: Up to date  Flu Vaccine status: Up to date  Pneumococcal vaccine status: Up to date  Covid-19 vaccine status: Completed vaccines  Qualifies for Shingles Vaccine? Yes   Zostavax completed Yes   Shingrix Completed?: No.    Education has been  provided regarding the importance of this vaccine. Patient has been advised to call insurance company to determine out of pocket expense if they have not yet received this vaccine. Advised may also receive vaccine at local pharmacy or Health Dept. Verbalized acceptance and understanding.  Screening Tests Health Maintenance  Topic Date Due   Zoster Vaccines- Shingrix (1 of 2) Never done   COVID-19 Vaccine (5 - Booster for Pfizer series) 07/26/2021   TETANUS/TDAP  06/28/2027   Pneumonia Vaccine 46+ Years old  Completed   INFLUENZA VACCINE  Completed   DEXA SCAN  Completed   HPV VACCINES  Aged Out    Health Maintenance  Health Maintenance Due  Topic Date Due   Zoster Vaccines- Shingrix (1 of 2) Never done    Colorectal cancer screening: No longer required.   Mammogram status: No longer required due to age.  Bone Density status: Completed 08/19/18. Results reflect: Bone density results: OSTEOPOROSIS. Repeat every 2 years. Pt to discuss with PCP  Lung Cancer Screening: (Low Dose CT Chest recommended if Age 13-80 years, 30 pack-year currently smoking OR have quit w/in 15years.) does not qualify.   Additional Screening:  Hepatitis C Screening: does not qualify  Vision Screening: Recommended annual ophthalmology exams for early detection of glaucoma and other disorders of the eye. Is the patient up to date with their annual eye exam?  Yes  Who is the provider or what is the name of the office in which the patient attends annual eye exams? [facility].   Dental Screening: Recommended annual  dental exams for proper oral hygiene  Community Resource Referral / Chronic Care Management: CRR required this visit?  No   CCM required this visit?  No      Plan:     I have personally reviewed and noted the following in the patients chart:   Medical and social history Use of alcohol, tobacco or illicit drugs  Current medications and supplements including opioid prescriptions.   Functional ability and status Nutritional status Physical activity Advanced directives List of other physicians Hospitalizations, surgeries, and ER visits in previous 12 months Vitals Screenings to include cognitive, depression, and falls Referrals and appointments  In addition, I have reviewed and discussed with patient certain preventive protocols, quality metrics, and best practice recommendations. A written personalized care plan for preventive services as well as general preventive health recommendations were provided to patient.     Clemetine Marker, LPN   [id number]   Nurse Notes: none

## 2021-07-21 NOTE — Patient Instructions (Signed)
Sydney Mcdaniel , Thank you for taking time to come for your Medicare Wellness Visit. I appreciate your ongoing commitment to your health goals. Please review the following plan we discussed and let me know if I can assist you in the future.   Screening recommendations/referrals: Colonoscopy: no longer required Mammogram: no longer required Bone Density: done 08/19/18 Recommended yearly ophthalmology/optometry visit for glaucoma screening and checkup Recommended yearly dental visit for hygiene and checkup  Vaccinations: Influenza vaccine: done 03/17/21 Pneumococcal vaccine: done 12/10/13 Tdap vaccine: done 2019 Shingles vaccine: Shingrix discussed. Please contact your pharmacy for coverage information.  Covid-19:done 07/25/19, 08/15/19, 03/29/20 & 05/31/21  Conditions/risks identified: Keep up the great work!  Next appointment: Follow up in one year for your annual wellness visit    Preventive Care 65 Years and Older, Female Preventive care refers to lifestyle choices and visits with your health care provider that can promote health and wellness. What does preventive care include? A yearly physical exam. This is also called an annual well check. Dental exams once or twice a year. Routine eye exams. Ask your health care provider how often you should have your eyes checked. Personal lifestyle choices, including: Daily care of your teeth and gums. Regular physical activity. Eating a healthy diet. Avoiding tobacco and drug use. Limiting alcohol use. Practicing safe sex. Taking low-dose aspirin every day. Taking vitamin and mineral supplements as recommended by your health care provider. What happens during an annual well check? The services and screenings done by your health care provider during your annual well check will depend on your age, overall health, lifestyle risk factors, and family history of disease. Counseling  Your health care provider may ask you questions about your: Alcohol  use. Tobacco use. Drug use. Emotional well-being. Home and relationship well-being. Sexual activity. Eating habits. History of falls. Memory and ability to understand (cognition). Work and work Statistician. Reproductive health. Screening  You may have the following tests or measurements: Height, weight, and BMI. Blood pressure. Lipid and cholesterol levels. These may be checked every 5 years, or more frequently if you are over 58 years old. Skin check. Lung cancer screening. You may have this screening every year starting at age 61 if you have a 30-pack-year history of smoking and currently smoke or have quit within the past 15 years. Fecal occult blood test (FOBT) of the stool. You may have this test every year starting at age 41. Flexible sigmoidoscopy or colonoscopy. You may have a sigmoidoscopy every 5 years or a colonoscopy every 10 years starting at age 32. Hepatitis C blood test. Hepatitis B blood test. Sexually transmitted disease (STD) testing. Diabetes screening. This is done by checking your blood sugar (glucose) after you have not eaten for a while (fasting). You may have this done every 1-3 years. Bone density scan. This is done to screen for osteoporosis. You may have this done starting at age 68. Mammogram. This may be done every 1-2 years. Talk to your health care provider about how often you should have regular mammograms. Talk with your health care provider about your test results, treatment options, and if necessary, the need for more tests. Vaccines  Your health care provider may recommend certain vaccines, such as: Influenza vaccine. This is recommended every year. Tetanus, diphtheria, and acellular pertussis (Tdap, Td) vaccine. You may need a Td booster every 10 years. Zoster vaccine. You may need this after age 59. Pneumococcal 13-valent conjugate (PCV13) vaccine. One dose is recommended after age 60. Pneumococcal polysaccharide (PPSV23)  vaccine. One dose is  recommended after age 10. Talk to your health care provider about which screenings and vaccines you need and how often you need them. This information is not intended to replace advice given to you by your health care provider. Make sure you discuss any questions you have with your health care provider. Document Released: 07/09/2015 Document Revised: 03/01/2016 Document Reviewed: 04/13/2015 Elsevier Interactive Patient Education  2017 [place] Prevention in the Home Falls can cause injuries. They can happen to people of all ages. There are many things you can do to make your home safe and to help prevent falls. What can I do on the outside of my home? Regularly fix the edges of walkways and driveways and fix any cracks. Remove anything that might make you trip as you walk through a door, such as a raised step or threshold. Trim any bushes or trees on the path to your home. Use bright outdoor lighting. Clear any walking paths of anything that might make someone trip, such as rocks or tools. Regularly check to see if handrails are loose or broken. Make sure that both sides of any steps have handrails. Any raised decks and porches should have guardrails on the edges. Have any leaves, snow, or ice cleared regularly. Use sand or salt on walking paths during winter. Clean up any spills in your garage right away. This includes oil or grease spills. What can I do in the bathroom? Use night lights. Install grab bars by the toilet and in the tub and shower. Do not use towel bars as grab bars. Use non-skid mats or decals in the tub or shower. If you need to sit down in the shower, use a plastic, non-slip stool. Keep the floor dry. Clean up any water that spills on the floor as soon as it happens. Remove soap buildup in the tub or shower regularly. Attach bath mats securely with double-sided non-slip rug tape. Do not have throw rugs and other things on the floor that can make you  trip. What can I do in the bedroom? Use night lights. Make sure that you have a light by your bed that is easy to reach. Do not use any sheets or blankets that are too big for your bed. They should not hang down onto the floor. Have a firm chair that has side arms. You can use this for support while you get dressed. Do not have throw rugs and other things on the floor that can make you trip. What can I do in the kitchen? Clean up any spills right away. Avoid walking on wet floors. Keep items that you use a lot in easy-to-reach places. If you need to reach something above you, use a strong step stool that has a grab bar. Keep electrical cords out of the way. Do not use floor polish or wax that makes floors slippery. If you must use wax, use non-skid floor wax. Do not have throw rugs and other things on the floor that can make you trip. What can I do with my stairs? Do not leave any items on the stairs. Make sure that there are handrails on both sides of the stairs and use them. Fix handrails that are broken or loose. Make sure that handrails are as long as the stairways. Check any carpeting to make sure that it is firmly attached to the stairs. Fix any carpet that is loose or worn. Avoid having throw rugs at the top or bottom  of the stairs. If you do have throw rugs, attach them to the floor with carpet tape. Make sure that you have a light switch at the top of the stairs and the bottom of the stairs. If you do not have them, ask someone to add them for you. What else can I do to help prevent falls? Wear shoes that: Do not have high heels. Have rubber bottoms. Are comfortable and fit you well. Are closed at the toe. Do not wear sandals. If you use a stepladder: Make sure that it is fully opened. Do not climb a closed stepladder. Make sure that both sides of the stepladder are locked into place. Ask someone to hold it for you, if possible. Clearly mark and make sure that you can  see: Any grab bars or handrails. First and last steps. Where the edge of each step is. Use tools that help you move around (mobility aids) if they are needed. These include: Canes. Walkers. Scooters. Crutches. Turn on the lights when you go into a dark area. Replace any light bulbs as soon as they burn out. Set up your furniture so you have a clear path. Avoid moving your furniture around. If any of your floors are uneven, fix them. If there are any pets around you, be aware of where they are. Review your medicines with your doctor. Some medicines can make you feel dizzy. This can increase your chance of falling. Ask your doctor what other things that you can do to help prevent falls. This information is not intended to replace advice given to you by your health care provider. Make sure you discuss any questions you have with your health care provider. Document Released: 04/08/2009 Document Revised: 11/18/2015 Document Reviewed: 07/17/2014 Elsevier Interactive Patient Education  2017 [employer].

## 2021-08-02 ENCOUNTER — Emergency Department
Admission: EM | Admit: 2021-08-02 | Discharge: 2021-08-02 | Disposition: A | Discharge status: Home / Self Care | Payer: Medicare Other | Attending: Emergency Medicine | Admitting: Emergency Medicine

## 2021-08-02 ENCOUNTER — Emergency Department: Payer: Medicare Other

## 2021-08-02 DIAGNOSIS — I10 Essential (primary) hypertension: Secondary | ICD-10-CM | POA: Diagnosis not present

## 2021-08-02 DIAGNOSIS — I4891 Unspecified atrial fibrillation: Secondary | ICD-10-CM | POA: Insufficient documentation

## 2021-08-02 DIAGNOSIS — I5032 Chronic diastolic (congestive) heart failure: Secondary | ICD-10-CM | POA: Diagnosis not present

## 2021-08-02 DIAGNOSIS — R55 Syncope and collapse: Secondary | ICD-10-CM

## 2021-08-02 DIAGNOSIS — R001 Bradycardia, unspecified: Secondary | ICD-10-CM | POA: Diagnosis not present

## 2021-08-02 DIAGNOSIS — I110 Hypertensive heart disease with heart failure: Secondary | ICD-10-CM | POA: Insufficient documentation

## 2021-08-02 DIAGNOSIS — R0902 Hypoxemia: Secondary | ICD-10-CM | POA: Diagnosis not present

## 2021-08-02 LAB — BASIC METABOLIC PANEL
Anion gap: 9 (ref 5–15)
BUN: 25 mg/dL — ABNORMAL HIGH (ref 8–23)
CO2: 22 mmol/L (ref 22–32)
Calcium: 9.6 mg/dL (ref 8.9–10.3)
Chloride: 103 mmol/L (ref 98–111)
Creatinine, Ser: 1.01 mg/dL — ABNORMAL HIGH (ref 0.44–1.00)
GFR, Estimated: 52 mL/min — ABNORMAL LOW (ref >60)
Glucose, Bld: 110 mg/dL — ABNORMAL HIGH (ref 70–99)
Potassium: 4.4 mmol/L (ref 3.5–5.1)
Sodium: 134 mmol/L — ABNORMAL LOW (ref 135–145)

## 2021-08-02 LAB — CBC
HCT: 34.1 % — ABNORMAL LOW (ref 36.0–46.0)
Hemoglobin: 11.2 g/dL — ABNORMAL LOW (ref 12.0–15.0)
MCH: 30.6 pg (ref 26.0–34.0)
MCHC: 32.8 g/dL (ref 30.0–36.0)
MCV: 93.2 fL (ref 80.0–100.0)
Platelets: 182 10*3/uL (ref 150–400)
RBC: 3.66 MIL/uL — ABNORMAL LOW (ref 3.87–5.11)
RDW: 15 % (ref 11.5–15.5)
WBC: 10.6 10*3/uL — ABNORMAL HIGH (ref 4.0–10.5)
nRBC: 0 % (ref 0.0–0.2)

## 2021-08-02 LAB — TROPONIN I (HIGH SENSITIVITY): Troponin I (High Sensitivity): 9 ng/L (ref <18)

## 2021-08-02 NOTE — ED Provider Notes (Signed)
[facility] Provider Note    Event Date/Time   First MD Initiated Contact with Patient 08/02/21 1214     (approximate)   History   Loss of Consciousness   HPI  Sydney Mcdaniel is a 88 y.o. female with past medical history of CLL, diastolic dysfunction, atrial fibrillation on Xarelto, anemia who presents after syncopal episode.  Patient's daughter tells me that she called her earlier while she was on the toilet said that she was not feeling well.  When her daughter arrived she was slumped over and had lost consciousness.  She was able to wake her up.  Patient does not really remember the episode.  Was having a bowel movement on the toilet.  Has been somewhat constipated and taking MiraLAX for this.  She denies any chest pain shortness of breath or palpitations.  Currently she feels back to baseline.  Denies fevers or chills she denies abdominal pain.  No urinary symptoms.  She never fell did not hit her head.    Past Medical History:  Diagnosis Date   Allergy    Anemia    Carotid arterial disease ([place])    a. 04/2020 Carotis U/S: 1-39% bilat ICA stenosis, <50% bilat ECA stenosis ->f/u 1 yr.   Cataract    CLL (chronic lymphocytic leukemia) (HCC)    Diastolic dysfunction    a. 02/2019 Echo: EF 60-65%, no rwma, doppler parameters consistent w/ pseudonormalization. Nl RV size/fxn.   GERD (gastroesophageal reflux disease)    Glaucoma    H/O: hysterectomy    Total   History of stress test    a. 02/2019 MV: EF 60%, no ischemia/infarct.   Hyperlipidemia    Hypertension    Hypothyroidism    Impaired fasting glucose    Lichen sclerosus    Osteoporosis    Hips   PAF (paroxysmal atrial fibrillation) ([place])    a. 06/15/2016 Event monitor: 4% afib burden; b. CHA2DS2VASc - 4-->Xarelto.   Sinus bradycardia    a. avoid AVN blocking agents.   Syncope    a. 03/2016 Echo: EF 55-60%, no rwma, mild AI/MR, nl PASP; b. 03/2016 48h Holter: no significant arrhythmias/pauses;  c. 03/2016 MV: mild apical defect, likely breast attenuation, nl EF, low risk; d. 05/2016 Event monitor: No significant arrhythmia; e. 05/2016 Event monitor: PAF (4%).    Patient Active Problem List   Diagnosis Date Noted   SIADH (syndrome of inappropriate ADH production) ([place]) 07/05/2021   Aspiration pneumonia ([place]) 05/21/2021   Elevated troponin 05/20/2021   Fall 02/11/2021   Normocytic anemia 05/31/2020   Goals of care, counseling/discussion 03/08/2020   Positive direct antiglobulin test (DAT) 12/04/2019   Sinus bradycardia 04/09/2019   First degree AV block 04/09/2019   Osteoporosis 08/21/2018   Bilateral carotid artery stenosis 04/19/2018   History of syncope 10/17/2017   MCI (mild cognitive impairment) 08/12/2017   Lung nodules 07/09/2017   Paroxysmal atrial fibrillation ([place]) 04/18/2017   Hyponatremia 10/20/2016   Chronic diastolic congestive heart failure ([place]) 06/28/2016   Primary open-angle glaucoma, bilateral, mild stage 05/08/2016   Bladder prolapse, female, acquired 02/25/2016   Asymptomatic bacteriuria 02/25/2016   B12 deficiency 01/11/2016   Anemia 01/10/2016   CLL (chronic lymphocytic leukemia) ([place]) 01/10/2016   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 12/21/2015   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 12/21/2015   Bradycardia 12/21/2015   Lymphocytosis 06/25/2015   Impaired fasting glucose 06/22/2015   Carotid atherosclerosis 06/22/2015   Essential  hypertension 12/22/2014   Hyperlipidemia 12/22/2014   Hypothyroidism 12/22/2014     Physical Exam  Triage Vital Signs: ED Triage Vitals  Enc Vitals Group     BP 08/02/21 1220 (!) 157/69     Pulse Rate 08/02/21 1220 82     Resp 08/02/21 1220 (!) 24     Temp 08/02/21 1220 97.9 F (36.6 C)     Temp Source 08/02/21 1220 Oral     SpO2 08/02/21 1220 100 %     Weight 08/02/21 1217 134 lb (60.8 kg)     Height 08/02/21 1217 5\' 2"  (1.575 m)     Head Circumference --      Peak Flow --      Pain  Score 08/02/21 1216 0     Pain Loc --      Pain Edu? --      Excl. in [place]? --     Most recent vital signs: Vitals:   08/02/21 1230 08/02/21 1400  BP: (!) 180/80 (!) 149/61  Pulse: 93 100  Resp: (!) 33 20  Temp:    SpO2: 100% 100%     General: Awake, no distress.  Anxious appearing CV:  Good peripheral perfusion.  No lower extremity edema Resp:  Normal effort.  Abd:  No distention.  Soft and nontender throughout Neuro:             Awake, Alert, Oriented x 3  Other:     ED Results / Procedures / Treatments  Labs (all labs ordered are listed, but only abnormal results are displayed) Labs Reviewed  BASIC METABOLIC PANEL - Abnormal; Notable for the following components:      Result Value   Sodium 134 (*)    Glucose, Bld 110 (*)    BUN 25 (*)    Creatinine, Ser 1.01 (*)    GFR, Estimated 52 (*)    All other components within normal limits  CBC - Abnormal; Notable for the following components:   WBC 10.6 (*)    RBC 3.66 (*)    Hemoglobin 11.2 (*)    HCT 34.1 (*)    All other components within normal limits  TROPONIN I (HIGH SENSITIVITY)     EKG  Interpreted by myself, left bundle branch block, A-fib, normal axis, does not meet any Sgarbossa criteria for ischemia   RADIOLOGY I reviewed the CXR which does not show any acute cardiopulmonary process; agree with radiology report     PROCEDURES:  Critical Care performed:   .1-3 Lead EKG Interpretation Performed by: Rada Hay, MD Authorized by: Rada Hay, MD     Interpretation: abnormal     ECG rate assessment: normal     Rhythm: atrial fibrillation     Ectopy: none     Conduction: normal    The patient is on the cardiac monitor to evaluate for evidence of arrhythmia and/or significant heart rate changes.   MEDICATIONS ORDERED IN ED: Medications - No data to display   IMPRESSION / MDM / [place] / ED COURSE  I reviewed the triage vital signs and the nursing notes.                               Differential diagnosis includes, but is not limited to, vasovagal episode, orthostatic, arrhythmia  Patient is a 88 year old female presents after syncopal episode.  This occurred while she was sitting down to use the  bathroom to have a bowel movement.  Patient's daughter notes she has had multiple syncopal episodes in the past usually similarly when she is on the toilet.  Patient does not really recall the episode.  Apparently she called her daughter saying she did not feel well and patient's daughter later found her passed out on the toilet.  She did not fall or have any head injury.  Her EKG today shows a left bundle with A-fib which is similar to prior no Sgarbossa criteria for ischemia.  Patient has no complaints including no abdominal pain chest pain shortness of breath or palpitations.  Her labs are all reassuring troponin is negative she is not anemic electrolytes are normal.  Overall the situation of her being on the toilet having a bowel movement suggest a vasovagal episode and given she has a history of this I think this is most likely.  Did consider other more serious pathology including cardiogenic given her age however with her otherwise reassuring work-up vasovagal being the most likely I do think she is appropriate charge.      FINAL CLINICAL IMPRESSION(S) / ED DIAGNOSES   Final diagnoses:  Vasovagal syncope     Rx / DC Orders   ED Discharge Orders     None        Note:  This document was prepared using Dragon voice recognition software and may include unintentional dictation errors.   Rada Hay, MD 08/02/21 [phone]

## 2021-08-02 NOTE — ED Triage Notes (Signed)
Pt arrives via [place] c/o syncopal episode when having a BM at her home. Pt did not fall off of toilet, but was kept seated by daughter.Pt is on xarelto for A-fib. EMS reports pt VS WNL, 12-lead shows A-fib.   #20 R AC by EMS

## 2021-08-02 NOTE — Discharge Instructions (Signed)
The episode today was likely vasovagal could be.  Your blood work was all reassuring.  If you develop any new symptoms such as chest pain or shortness of breath, please return to the emergency department.

## 2021-08-09 ENCOUNTER — Other Ambulatory Visit: Payer: Self-pay | Admitting: Family

## 2021-08-09 NOTE — Telephone Encounter (Signed)
Rx(s) sent to pharmacy electronically.  

## 2021-08-10 ENCOUNTER — Ambulatory Visit (INDEPENDENT_AMBULATORY_CARE_PROVIDER_SITE_OTHER): Payer: Medicare Other

## 2021-08-10 ENCOUNTER — Ambulatory Visit (INDEPENDENT_AMBULATORY_CARE_PROVIDER_SITE_OTHER): Payer: Medicare Other | Admitting: Nurse Practitioner

## 2021-08-10 ENCOUNTER — Encounter: Payer: Self-pay | Admitting: Nurse Practitioner

## 2021-08-10 ENCOUNTER — Other Ambulatory Visit: Payer: Self-pay

## 2021-08-10 VITALS — BP 112/58 | HR 43 | Ht 62.0 in | Wt 135.0 lb

## 2021-08-10 DIAGNOSIS — I6523 Occlusion and stenosis of bilateral carotid arteries: Secondary | ICD-10-CM

## 2021-08-10 DIAGNOSIS — I480 Paroxysmal atrial fibrillation: Secondary | ICD-10-CM

## 2021-08-10 DIAGNOSIS — R001 Bradycardia, unspecified: Secondary | ICD-10-CM | POA: Diagnosis not present

## 2021-08-10 DIAGNOSIS — R55 Syncope and collapse: Secondary | ICD-10-CM | POA: Diagnosis not present

## 2021-08-10 DIAGNOSIS — I10 Essential (primary) hypertension: Secondary | ICD-10-CM | POA: Diagnosis not present

## 2021-08-10 DIAGNOSIS — E782 Mixed hyperlipidemia: Secondary | ICD-10-CM | POA: Diagnosis not present

## 2021-08-10 DIAGNOSIS — I420 Dilated cardiomyopathy: Secondary | ICD-10-CM

## 2021-08-10 NOTE — Patient Instructions (Signed)
Medication Instructions:   Your physician has recommended you make the following change in your medication:   STOP taking amlodipine (Norvasc)   *If you need a refill on your cardiac medications before your next appointment, please call your pharmacy*   Lab Work:  None ordered  If you have labs (blood work) drawn today and your tests are completely normal, you will receive your results only by: [place] (if you have MyChart) OR A paper copy in the mail If you have any lab test that is abnormal or we need to change your treatment, we will call you to review the results.   Testing/Procedures:  Your physician has recommended that you wear a Zio AT Live monitor for 14 days  This monitor is a medical device that records the hearts electrical activity. Doctors most often use these monitors to diagnose arrhythmias. Arrhythmias are problems with the speed or rhythm of the heartbeat. The monitor is a small device applied to your chest. You can wear one while you do your normal daily activities. While wearing this monitor if you have any symptoms to push the button and record what you felt. Once you have worn this monitor for the period of time provider prescribed (Usually 14 days), you will return the monitor device in the postage paid box/bag. Once it is returned they will download the data collected and provide [place] with a report which the provider will then review and we will call you with those results.   Important tips:  Avoid showering during the first 24 hours of wearing the monitor. Avoid excessive sweating to help maximize wear time. Do not submerge the device, no hot tubs, and no swimming pools. Keep any lotions or oils away from the patch. After 24 hours you may shower with the patch on. Take brief showers with your back facing the shower head.  Do not remove patch once it has been placed because that will interrupt data and decrease adhesive wear time. Push the button when  you have any symptoms and write down what you were feeling. Once you have completed wearing your monitor, remove and place into box which has postage paid and place in your outgoing mailbox.  If for some reason you have misplaced your box then call our office and we can provide another box and/or mail it off for you. Keep the transmitter within 10 feet at all times.  Expect a welcome phone call within 81-01 hrs of application from [place].  This call will include your copay information, so please answer any unknown phone calls while wearing Zio (it could also be important information about your heart) The envelope to return Zio is in the back of the transmitter. Removal instructions are on the last page of the symptom diary.  Place the patch sticky side up inside the transmitter and the symptom diary inside the envelope to return on your last wear day inside your mailbox or any USPS mailbox.    Follow-Up: At [facility], you and your health needs are our priority.  As part of our continuing mission to provide you with exceptional heart care, we have created designated Provider Care Teams.  These Care Teams include your primary Cardiologist (physician) and Advanced Practice Providers (APPs -  Physician Assistants and Nurse Practitioners) who all work together to provide you with the care you need, when you need it.  We recommend signing up for the patient portal called "MyChart".  Sign up information is provided on this After  Visit Summary.  MyChart is used to connect with patients for Virtual Visits (Telemedicine).  Patients are able to view lab/test results, encounter notes, upcoming appointments, etc.  Non-urgent messages can be sent to your provider as well.   To learn more about what you can do with MyChart, go to [email or web address].    Your next appointment:   4-6 week(s)  The format for your next appointment:   In Person  Provider:   You may see Nelva Bush, MD or one of the  following Advanced Practice Providers on your designated Care Team:   [name], NP Christell Faith, PA-C Cadence Kathlen Mody, PA-C :1}    Other Instructions Referral placed for EP

## 2021-08-10 NOTE — Progress Notes (Signed)
Office Visit    Patient Name: Sydney Mcdaniel Date of Encounter: 08/10/2021  Primary Care Provider:  Teodora Medici, DO Primary Cardiologist:  Nelva Bush, MD  Chief Complaint    88 year old female with a history of paroxysmal atrial fibrillation, HFpEF, sinus bradycardia, hypertension, hyperlipidemia, CLL, B12 deficiency, GERD, vasovagal syncope, hypothyroidism, and carotid arterial disease, who presents for follow-up related to bradycardia and LV dysfunction.  Past Medical History    Past Medical History:  Diagnosis Date   Allergy    Anemia    Cardiomyopathy ([place])    a. 02/2019 Echo: EF 60-65%, no rwma, doppler parameters consistent w/ pseudonormalization. Nl RV size/fxn; b. 06/2021 Echo: EF 40-45% w/ sev basal-mid antsept HK. GrII DD. Nl RV size/fxn. Mild MR/AI.   Carotid arterial disease ([place])    a. 04/2020 Carotid U/S: 1-39% bilat ICA stenosis, <50% bilat ECA stenosis; b. 06/2021 Carotid U/S: RICA 2-95%, LICA 62-13%.   Cataract    CLL (chronic lymphocytic leukemia) (H[facility])    GERD (gastroesophageal reflux disease)    Glaucoma    H/O: hysterectomy    Total   History of stress test    a. 02/2019 MV: EF 60%, no ischemia/infarct.   Hyperlipidemia    Hypertension    Hypothyroidism    Impaired fasting glucose    Lichen sclerosus    Osteoporosis    Hips   PAF (paroxysmal atrial fibrillation) ([place])    a. 06/15/2016 Event monitor: 4% afib burden; b. CHA2DS2VASc - 4-->Xarelto.   Sinus bradycardia    a. avoid AVN blocking agents.   Syncope    a. 03/2016 Echo: EF 55-60%, no rwma, mild AI/MR, nl PASP; b. 03/2016 48h Holter: no significant arrhythmias/pauses; c. 03/2016 MV: mild apical defect, likely breast attenuation, nl EF, low risk; d. 05/2016 Event monitor: No significant arrhythmia; e. 05/2016 Event monitor: PAF (4%).   Past Surgical History:  Procedure Laterality Date   APPENDECTOMY     EYE SURGERY     Glaucoma   MULTIPLE TOOTH EXTRACTIONS N/A    2 teeth    TOTAL ABDOMINAL HYSTERECTOMY      Allergies  Allergies  Allergen Reactions   Other    Alphagan [Brimonidine] Itching and Rash   Pravachol [Pravastatin Sodium] Itching and Rash   Pravastatin Itching, Other (See Comments) and Rash    History of Present Illness    88 year old female with the above past medical history including paroxysmal atrial fibrillation, sinus bradycardia, HFpEF, hypertension, hyperlipidemia, CLL, B12 deficiency, GERD, vasovagal syncope, hypothyroid, and carotid arterial disease.  In the fall 2017, she was admitted for recurrent presyncope and syncope with echocardiogram showing normal LV function.  Stress testing was nonischemic.  She subsequently wore a Holter monitor followed by 2 separate event monitors in December 2017, which did not show any significant bradycardia or tachyarrhythmias, but did show a 4% paroxysmal atrial fibrillation burden.  She was then placed on Xarelto.  In September 2020, she was hospitalized with chest pain and hypertensive urgency.  Pain was felt to be atypical and she ruled out.  Stress testing was undertaken and showed no evidence of ischemia or infarct.  Echo was performed and showed an EF of 60 to 08% with diastolic dysfunction.  She was admitted October 2022 with weakness, imbalance, and confusion in the setting of hyponatremia with a sodium of 125.  She had a previous diagnosis of SIADH.  She was treated for UTI and also provided IV fluids with normalization of sodium.  She  was readmitted November 2022 in the setting of flu A and aspiration pneumonia.  She again required IV fluids and salt tablets in the setting of acute on chronic hyponatremia.  She had mild elevation of troponin to 75 which was felt to be due to demand ischemia in the setting of respiratory failure.  On clinical improvement, she was discharged home.  Sydney Mcdaniel was last seen in cardiology clinic on June 01, 2021, at which time she noted slow but steadily improving strength  without chest pain or dyspnea.  He was noted that CT of the chest during hospitalization showed multivessel coronary calcifications.  In this setting, combined with demand ischemia noted during prior hospitalization, an echocardiogram was performed in the outpatient setting, in January and showed reduction in EF to 40 to 45% with severe basal-mid anteroseptal hypokinesis and grade 2 diastolic dysfunction.  Mild MR and AI were also noted.  Since her last visit, she has had 2 syncopal episodes in the setting of having a bowel movement.  On December 24, her her daughter indicates that the patient was using the bathroom and lost consciousness while on the toilet.  Family found her unresponsive and slumped over on the toilet.  When she was assisted to the floor, she regained consciousness very quickly.  She did not seek medical attention.  She had a similar episode on February 7.  She is unclear on the details but remembers going into the bathroom for routine bowel movement and once she was on the toilet, did not feel well.  She is not sure exactly what she was feeling, just remembers not feeling well.  She called her daughter who was just outside and her daughter came in.  Upon arrival, she found Sydney Mcdaniel unresponsive.  She assisted her to the floor and shortly thereafter, Sydney Mcdaniel regained consciousness.  Her daughter noted that there was a large amount of stool in the toilet.  The patient does not remember feeling constipated or having to strain.  EMS was called and she was taken to the ED.  She was noted to be in rate controlled atrial fibrillation in the ER and was initially hypertensive.  Lab work was notable for very slight elevation of BUN and creatinine above baseline at 25-1.01.  Troponin was normal.  H&H was stable at 11.2 and 34.1.  White count was slightly elevated at 10.6.  Chest x-ray was unremarkable.  She was subsequently discharged home.    Over the past week, she has had no recurrence of  presyncope or syncope.  Her daughter checks her heart rate and blood pressure regularly and they have noted some softer blood pressures, sometimes trending in the 110 range.  Heart rates have also been low at times, with recordings in the 40s.  Her heart rate is 43 today.  She is currently asymptomatic.  She has been working with physical therapy and does walk some with a cane at home but overall, activity is limited.  She denies chest pain, dyspnea, palpitations, PND, orthopnea, edema, or early satiety.  Home Medications    Current Outpatient Medications  Medication Sig Dispense Refill   acetaminophen (TYLENOL) 500 MG tablet Take 1,000 mg by mouth every 8 (eight) hours as needed.     alendronate (FOSAMAX) 70 MG tablet TAKE 1 TABLET BY MOUTH ONCE A WEEK WITH A FULL GLASS OF WATER ON AN EMPTY STOMACH 12 tablet 0   amLODipine (NORVASC) 5 MG tablet Take 1 tablet by mouth once daily 90 tablet  2   atorvastatin (LIPITOR) 20 MG tablet Take by mouth.     ferrous sulfate 325 (65 FE) MG tablet Take 1 tablet (325 mg total) by mouth daily with breakfast.  3   latanoprost (XALATAN) 0.005 % ophthalmic solution Place 1 drop into the right eye at bedtime.      levothyroxine (EUTHYROX) 75 MCG tablet TAKE ONE TABLET BY MOUTH ON MONDAYS, WEDNESDAYS, FRIDAYS, AND SUNDAYS 52 tablet 2   levothyroxine (EUTHYROX) 88 MCG tablet TAKE 1 TABLET BY MOUTH ON TUESDAYS, THURSDAYS, AND SATURDAYS 39 tablet 2   lisinopril (ZESTRIL) 40 MG tablet Take 1 tablet (40 mg total) by mouth daily. 90 tablet 1   Netarsudil Dimesylate (RHOPRESSA) 0.02 % SOLN Place 1 drop into the right eye at bedtime.     polyethylene glycol (MIRALAX / GLYCOLAX) 17 g packet Take 17 g by mouth daily.     Rivaroxaban (XARELTO) 15 MG TABS tablet Take 1 tablet (15 mg total) by mouth daily with supper. 90 tablet 2   senna (SENOKOT) 8.6 MG tablet Take 1 tablet by mouth as needed. Uses only when miralax does not work.     spironolactone (ALDACTONE) 25 MG tablet Take  1 tablet (25 mg total) by mouth daily. 90 tablet 3   timolol (TIMOPTIC) 0.5 % ophthalmic solution Place 1 drop into the right eye 2 (two) times daily. Instill 1 drop in the morning and 1 drop at 1700     vitamin B-12 (CYANOCOBALAMIN) 100 MCG tablet Take 100 mcg by mouth daily.     No current facility-administered medications for this visit.     Review of Systems    2 syncopal episodes since her last visit.  She denies chest pain, dyspnea, palpitations, PND, orthopnea, edema, or early satiety.  All other systems reviewed and are otherwise negative except as noted above.    Physical Exam    VS:  BP (!) 112/58 (BP Location: Right Arm, Patient Position: Sitting, Cuff Size: Normal)    Pulse (!) 43    Ht 5\' 2"  (1.575 m)    Wt 135 lb (61.2 kg)    LMP  (LMP Unknown)    SpO2 96%    BMI 24.69 kg/m  , BMI Body mass index is 24.69 kg/m.     GEN: Thin, frail, in no acute distress. HEENT: normal. Neck: Supple, no JVD, carotid bruits, or masses. Cardiac: RRR, no murmurs, rubs, or gallops. No clubbing, cyanosis, edema.  Radials/PT 2+ and equal bilaterally.  Respiratory:  Respirations regular and unlabored, clear to auscultation bilaterally. GI: Soft, nontender, nondistended, BS + x 4. MS: no deformity or atrophy. Skin: warm and dry, no rash. Neuro:  Strength and sensation are intact. Psych: Normal affect.  Accessory Clinical Findings    ECG personally reviewed by me today -sinus bradycardia, 43, first-degree AV block, IVCD- no acute changes.  Lab Results  Component Value Date   WBC 10.6 (H) 08/02/2021   HGB 11.2 (L) 08/02/2021   HCT 34.1 (L) 08/02/2021   MCV 93.2 08/02/2021   PLT 182 08/02/2021   Lab Results  Component Value Date   CREATININE 1.01 (H) 08/02/2021   BUN 25 (H) 08/02/2021   NA 134 (L) 08/02/2021   K 4.4 08/02/2021   CL 103 08/02/2021   CO2 22 08/02/2021   Lab Results  Component Value Date   ALT 19 05/19/2021   AST 26 05/19/2021   ALKPHOS 27 (L) 05/19/2021    BILITOT 0.6 05/19/2021   Lab Results  Component Value Date   CHOL 171 11/17/2020   HDL 55 11/17/2020   LDLCALC 90 11/17/2020   TRIG 147 11/17/2020   CHOLHDL 3.1 11/17/2020    Lab Results  Component Value Date   HGBA1C 6.0 (H) 07/02/2018   TSH was 1.04 in November 23  Assessment & Plan    1.  Syncope/vasovagal syncope: Patient with a history of vasovagal syncope and to syncopal episodes since her last visit here in December.  Both occurred while she was using the toilet/having a bowel movement.  She was seen in the emergency department following most recent episode on February 7 with unremarkable findings.  She was in atrial fibrillation at that time but this was rate controlled and it appears she was asymptomatic otherwise.  She does not remember the details surrounding the event other than having walked into the bathroom for routine bowel movement and then called her daughter because she was feeling poorly when she had used the bathroom.  Her daughter notes that heart rate and blood pressure trends at home and been lower recently with heart rate sometimes into the 40s.  Her heart rate is 43 today with sinus bradycardia and first-degree AV block.  Heart rate only rose into the mid 50s with walking in the hallway.  She is not on any AV nodal blocking agents.  She does take amlodipine for blood pressure and I will stop this as pressures have been trending lower at home as well.  I am placing a 14-day ZIO monitor to assess for prolonged bradycardia or tachyarrhythmias, and will refer to EP.  2.  Sinus bradycardia/ ? SSS: Heart rate trends at home have been lower per daughter, with rates commonly in the 50s and sometimes 40s.  As outlined above, she is 1 today with a first-degree AV block and IVCD.  Heart rate rose to the mid 50s with ambulation in the hallway today.  She is asymptomatic currently.  Placing Zio monitor as outlined above.  Follow-up with EP for consideration of pacing.  3.   Cardiomyopathy/chronic heart failure with midrange ejection fraction: Patient with prior history of HFpEF an EF of 60 to 65% by echo in September 2020.  In the setting of demand ischemia during hospitalization for respiratory infection in November, she had a follow-up echo in January, which showed an EF of 40 to 45% with severe basal to mid anteroseptal hypokinesis and grade 2 diastolic dysfunction.  We discussed these findings today.  We discussed the potential role of ischemic evaluation in the setting of the new finding of cardiomyopathy.  In the setting of advanced age, Sydney Mcdaniel and her daughter prefer a more conservative approach if possible and at this time blood pressure for to avoid ischemic testing.  We did discuss that ischemia may play a role in bradycardia, and ischemic evaluation might be warranted following EP evaluation.  4.  Essential hypertension: As above, blood pressures have been softer at home recently.  Discontinuing amlodipine.  5.  Hyperlipidemia: Back on atorvastatin after stopping it last year.  6.  Hyponatremia: Sodium was stable at 134 during recent ED visit on February 7.  7.  Paroxysmal atrial fibrillation: She was in rate controlled A-fib in the ED on February 7.  Previously counseled for 4% burden on monitoring.  Not clear that she is having symptoms.  She is not currently on any AV nodal blocking agents.  Continue oral anticoagulation.  8.  Carotid arterial disease: Recent follow-up carotid ultrasound with stable 1  to 39% right internal carotid artery stenosis and slight increase on the left to 40 to 59%.  Follow-up in 1 year.  Continue statin therapy.  9.  Disposition: Follow-up event monitoring.  Follow-up with EP.  [name], NP 08/10/2021, 11:16 AM

## 2021-08-12 DIAGNOSIS — I480 Paroxysmal atrial fibrillation: Secondary | ICD-10-CM

## 2021-08-12 DIAGNOSIS — Z20822 Contact with and (suspected) exposure to covid-19: Secondary | ICD-10-CM | POA: Diagnosis not present

## 2021-08-14 DIAGNOSIS — I480 Paroxysmal atrial fibrillation: Secondary | ICD-10-CM | POA: Diagnosis not present

## 2021-08-29 NOTE — Telephone Encounter (Signed)
Patient is scheduled to see EP Dr. Quentin Ore on 09/14/21. Discussed with Dr. Olin Pia nurse Nira Conn, RN. ?Dr. Caryl Comes could see the patient tomorrow for consult 08/30/21 @ 8:20 am. ? ?Called the patients daughter [name] and made her aware of Sydney Bayley, NP response and recommendation. ?Patients daughter  is agreeable with seeing Dr. Caryl Comes for the appt tomorrow. ?[name] voiced appreciation for the assistance. ? ?

## 2021-08-30 ENCOUNTER — Ambulatory Visit (INDEPENDENT_AMBULATORY_CARE_PROVIDER_SITE_OTHER): Payer: Medicare Other | Admitting: Internal Medicine

## 2021-08-30 ENCOUNTER — Other Ambulatory Visit: Payer: Self-pay

## 2021-08-30 ENCOUNTER — Encounter: Payer: Self-pay | Admitting: Internal Medicine

## 2021-08-30 VITALS — BP 185/67 | HR 52 | Ht 62.0 in | Wt 136.0 lb

## 2021-08-30 DIAGNOSIS — R55 Syncope and collapse: Secondary | ICD-10-CM | POA: Diagnosis not present

## 2021-08-30 DIAGNOSIS — R001 Bradycardia, unspecified: Secondary | ICD-10-CM

## 2021-08-30 MED ORDER — MIDODRINE HCL 2.5 MG PO TABS: ORAL_TABLET | ORAL | 6 refills | Status: DC

## 2021-08-30 NOTE — Patient Instructions (Signed)
Medication Instructions:  ?- Your physician has recommended you make the following change in your medication:  ? ?1) START Proamatine (midodrine) 2.5 mg: ?- take 1 tablet in the morning when waking up & take 1 tablet by mouth after your nap ? ?*If you need a refill on your cardiac medications before your next appointment, please call your pharmacy* ? ? ?Lab Work: ?- none ordered ? ?If you have labs (blood work) drawn today and your tests are completely normal, you will receive your results only by: ?MyChart Message (if you have MyChart) OR ?A paper copy in the mail ?If you have any lab test that is abnormal or we need to change your treatment, we will call you to review the results. ? ? ?Testing/Procedures: ?- none ordered ? ? ?Follow-Up: ?At [facility], you and your health needs are our priority.  As part of our continuing mission to provide you with exceptional heart care, we have created designated Provider Care Teams.  These Care Teams include your primary Cardiologist (physician) and Advanced Practice Providers (APPs -  Physician Assistants and Nurse Practitioners) who all work together to provide you with the care you need, when you need it. ? ?We recommend signing up for the patient portal called "MyChart".  Sign up information is provided on this After Visit Summary.  MyChart is used to connect with patients for Virtual Visits (Telemedicine).  Patients are able to view lab/test results, encounter notes, upcoming appointments, etc.  Non-urgent messages can be sent to your provider as well.   ?To learn more about what you can do with MyChart, go to [email or web address].   ? ?Your next appointment:   ?As needed  ? ?The format for your next appointment:   ?In Person ? ?Provider:   ?Virl Axe, MD  ? ? ?Other Instructions ? ?1) Try to obtain an abdominal binder to wear during your waking hours only ? ? ?PROAMATINE (Midodrine) tablets ?What is this medication? ?MIDODRINE (MI doe dreen) is used to treat  low blood pressure in patients who have symptoms like dizziness when going from a sitting to a standing position. ?This medicine may be used for other purposes; ask your health care provider or pharmacist if you have questions. ?COMMON BRAND NAME(S): Orvaten, ProAmatine ?What should I tell my care team before I take this medication? ?They need to know if you have any of the following conditions: ?difficulty passing urine ?heart disease ?high blood pressure ?kidney disease ?over active thyroid ?pheochromocytoma ?an unusual or allergic reaction to midodrine, other medicines, foods, dyes, or preservatives ?pregnant or trying to get pregnant ?breast-feeding ?How should I use this medication? ?Take this medicine by mouth with a glass of water. Follow the directions on the prescription label. The last dose of this medicine should not be taken after the evening meal or less than 4 hours before bedtime. When you lie down for any length of time after taking this medicine, high blood pressure can occur. Do not take this medicine if you will be lying down for any length of time. Do not take your medicine more often than directed. Do not stop taking except on your doctor's advice. ?Talk to your pediatrician regarding the use of this medicine in children. Special care may be needed. ?Overdosage: If you think you have taken too much of this medicine contact a poison control center or emergency room at once. ?NOTE: This medicine is only for you. Do not share this medicine with others. ?What if I miss a  dose? ?If you miss a dose, take it as soon as you can. If it is almost time for your next dose, take only that dose. Do not take double or extra doses. ?What may interact with this medication? ?Do not take this medicine with any of the following medications: ?MAOIs like Carbex, Eldepryl, Marplan, Nardil, and Parnate ?medicines called ergot alkaloids ?medicines for colds and breathing difficulties or weight loss ?procarbazine ?This  medicine may also interact with the following medications: ?cimetidine ?digoxin ?flecainide ?fludrocortisone ?metformin ?procainamide ?quinidine ?ranitidine ?triamterene ?medicines called alpha-blockers like doxazosin, prazosin, and terazosin ?This list may not describe all possible interactions. Give your health care provider a list of all the medicines, herbs, non-prescription drugs, or dietary supplements you use. Also tell them if you smoke, drink alcohol, or use illegal drugs. Some items may interact with your medicine. ?What should I watch for while using this medication? ?Visit your doctor or health care professional for regular checks on your progress. ?You may get drowsy or dizzy. Do not drive, use machinery, or do anything that needs mental alertness until you know how this medicine affects you. Do not stand or sit up quickly, especially if you are an older patient. This reduces the risk of dizzy or fainting spells. ?Your mouth may get dry. Chewing sugarless gum or sucking hard candy, and drinking plenty of water may help. Contact your doctor if the problem does not go away or is severe. ?Do not treat yourself for coughs, colds, or pain while you are taking this medicine without asking your doctor or health care professional for advice. Some ingredients may increase your blood pressure. ?What side effects may I notice from receiving this medication? ?Side effects that you should report to your doctor or health care professional as soon as possible: ?awareness of heart beating ?blurred vision ?headache ?irregular heartbeat, palpitations, or chest pain ?pounding in the ears ?skin rash, hives ?Side effects that usually do not require medical attention (report to your doctor or health care professional if they continue or are bothersome): ?change in heart rate ?chills ?goose bumps ?increased need to urinate ?itching ?stomach pain ?tingling in the skin or scalp ?This list may not describe all possible side  effects. Call your doctor for medical advice about side effects. You may report side effects to FDA at 1-800-FDA-1088. ?Where should I keep my medication? ?Keep out of the reach of children. ?Store at room temperature between 15 and 30 degrees C (59 and 86 degrees F). Throw away any unused medicine after the expiration date. ?NOTE: This sheet is a summary. It may not cover all possible information. If you have questions about this medicine, talk to your doctor, pharmacist, or health care provider. ?? 2022 [employer]vier/Gold Standard (2008-02-05 00:00:00) ? ?

## 2021-08-30 NOTE — Progress Notes (Unsigned)
Error

## 2021-08-31 NOTE — Progress Notes (Signed)
ELECTROPHYSIOLOGY CONSULT NOTE  Patient ID: Sydney Mcdaniel, MRN: [id number], DOB/AGE: 10-12-1924 88 y.o. Admit date: (Not on file) Date of Consult: 08/31/2021  Primary Physician: Teodora Medici, DO Primary Cardiologist: CE/CB     Sydney Mcdaniel is a 88 y.o. female who is being seen today for the evaluation of synocpe  at the request of CE/CB.    HPI Sydney Mcdaniel is a 88 y.o. female referred for recurrent syncope dating back at least to 2017.  Also has paroxysmal atrial fibrillation for which she is anticoagulated with rivaroxaba;  B12 deficiency, anemia, CLL and HFpEF  Syncopal episodes have been largely stereotypical with complaints of nausea and diaphoresis, both notable in as prodrome as well as in recovery, albeit somewhat variably.  She has documented orthostatic hypotension and a presumptive diagnosis of vasovagal syncope  Many of the events have occurred in relation to the bathroom, having urinated or had a BM.  Return to consciousness is relatively rapid when she is supine.  She wears a fall necklace and alerts her daughter with an event.  The most recent event her daughter found her slumped against the wall, she then lost consciousness again, was laid flat regained consciousness.  EMS was called.  Blood pressures were relatively normal. She has a longstanding history of bradycardia and an event recorder was recently applied with rates into the 40s.  No events occurred while wearing the monitor but she had 2 afterwards as noted above. Atrial fibrillation burden was about 4%.  Functional status seems largely unchanged over the last couple years according to her and her daughter.  As noted when she saw CB in the office, her heart rates were in the 40s and she was asymptomatic.   DATE TEST EF    9/20 Echo  60-65%     9/20 Myoview   No ischemia   1/23 Echo   40-45 % Basilar anteroseptal severe HK             Date Cr K Hgb  2/23 1.01 4.4 11.2 <<8.9       Past Medical History:  Diagnosis Date   Allergy    Anemia    Cardiomyopathy ([place])    a. 02/2019 Echo: EF 60-65%, no rwma, doppler parameters consistent w/ pseudonormalization. Nl RV size/fxn; b. 06/2021 Echo: EF 40-45% w/ sev basal-mid antsept HK. GrII DD. Nl RV size/fxn. Mild MR/AI.   Carotid arterial disease ([place])    a. 04/2020 Carotid U/S: 1-39% bilat ICA stenosis, <50% bilat ECA stenosis; b. 06/2021 Carotid U/S: RICA 2-13%, LICA 08-65%.   Cataract    CLL (chronic lymphocytic leukemia) (HCC)    GERD (gastroesophageal reflux disease)    Glaucoma    H/O: hysterectomy    Total   History of stress test    a. 02/2019 MV: EF 60%, no ischemia/infarct.   Hyperlipidemia    Hypertension    Hypothyroidism    Impaired fasting glucose    Lichen sclerosus    Osteoporosis    Hips   PAF (paroxysmal atrial fibrillation) ([place])    a. 06/15/2016 Event monitor: 4% afib burden; b. CHA2DS2VASc - 4-->Xarelto.   Sinus bradycardia    a. avoid AVN blocking agents.   Syncope    a. 03/2016 Echo: EF 55-60%, no rwma, mild AI/MR, nl PASP; b. 03/2016 48h Holter: no significant arrhythmias/pauses; c. 03/2016 MV: mild apical defect, likely breast attenuation, nl EF, low risk; d. 05/2016 Event monitor: No significant arrhythmia; e. 05/2016  Event monitor: PAF (4%).      Surgical History:  Past Surgical History:  Procedure Laterality Date   APPENDECTOMY     EYE SURGERY     Glaucoma   MULTIPLE TOOTH EXTRACTIONS N/A    2 teeth   TOTAL ABDOMINAL HYSTERECTOMY       Home Meds: Current Meds  Medication Sig   acetaminophen (TYLENOL) 500 MG tablet Take 1,000 mg by mouth every 8 (eight) hours as needed.   alendronate (FOSAMAX) 70 MG tablet TAKE 1 TABLET BY MOUTH ONCE A WEEK WITH A FULL GLASS OF WATER ON AN EMPTY STOMACH   atorvastatin (LIPITOR) 20 MG tablet Take 20 mg by mouth daily.   ferrous sulfate 325 (65 FE) MG tablet Take 1 tablet (325 mg total) by mouth daily with breakfast.   latanoprost (XALATAN)  0.005 % ophthalmic solution Place 1 drop into the right eye at bedtime.    levothyroxine (EUTHYROX) 75 MCG tablet TAKE ONE TABLET BY MOUTH ON MONDAYS, WEDNESDAYS, FRIDAYS, AND SUNDAYS   levothyroxine (EUTHYROX) 88 MCG tablet TAKE 1 TABLET BY MOUTH ON TUESDAYS, THURSDAYS, AND SATURDAYS   lisinopril (ZESTRIL) 40 MG tablet Take 1 tablet (40 mg total) by mouth daily.   midodrine (PROAMATINE) 2.5 MG tablet Take 1 tablet (2.5 mg) by mouth when waking up in the morning & take 1 tablet (2.5 mg) by mouth after your nap   Netarsudil Dimesylate (RHOPRESSA) 0.02 % SOLN Place 1 drop into the right eye at bedtime.   polyethylene glycol (MIRALAX / GLYCOLAX) 17 g packet Take 17 g by mouth daily.   Rivaroxaban (XARELTO) 15 MG TABS tablet Take 1 tablet (15 mg total) by mouth daily with supper.   senna (SENOKOT) 8.6 MG tablet Take 1 tablet by mouth as needed. Uses only when miralax does not work.   spironolactone (ALDACTONE) 25 MG tablet Take 1 tablet (25 mg total) by mouth daily.   timolol (TIMOPTIC) 0.5 % ophthalmic solution Place 1 drop into the right eye 2 (two) times daily. Instill 1 drop in the morning and 1 drop at 1700   vitamin B-12 (CYANOCOBALAMIN) 100 MCG tablet Take 100 mcg by mouth daily.    Allergies:  Allergies  Allergen Reactions   Other    Alphagan [Brimonidine] Itching and Rash   Pravachol [Pravastatin Sodium] Itching and Rash   Pravastatin Itching, Other (See Comments) and Rash    Social History   Socioeconomic History   Marital status: Widowed    Spouse name: Not on file   Number of children: 2   Years of education: Not on file   Highest education level: High school graduate  Occupational History   Occupation: retired  Tobacco Use   Smoking status: Never   Smokeless tobacco: Never  Vaping Use   Vaping Use: Never used  Substance and Sexual Activity   Alcohol use: No   Drug use: No   Sexual activity: Not Currently  Other Topics Concern   Not on file  Social History  Narrative   Pt lives alone. Daughter [name] close by   Social Determinants of Radio broadcast assistant Strain: Low Risk    Difficulty of Paying Living Expenses: Not hard at all  Food Insecurity: No Food Insecurity   Worried About Charity fundraiser in the Last Year: Never true   Arboriculturist in the Last Year: Never true  Transportation Needs: No Transportation Needs   Lack of Transportation (Medical): No   Lack of  Transportation (Non-Medical): No  Physical Activity: Inactive   Days of Exercise per Week: 0 days   Minutes of Exercise per Session: 0 min  Stress: No Stress Concern Present   Feeling of Stress : Not at all  Social Connections: Moderately Isolated   Frequency of Communication with Friends and Family: More than three times a week   Frequency of Social Gatherings with Friends and Family: More than three times a week   Attends Religious Services: More than 4 times per year   Active Member of [employer] or Organizations: No   Attends Archivist Meetings: Never   Marital Status: Widowed  Human resources officer Violence: Not At Risk   Fear of Current or Ex-Partner: No   Emotionally Abused: No   Physically Abused: No   Sexually Abused: No     Family History  Problem Relation Age of Onset   Heart attack Mother    Glaucoma Mother    Heart attack Father    Parkinson's disease Brother    Heart attack Brother      ROS:  Please see the history of present illness.     All other systems reviewed and negative.    Physical Exam:  Blood pressure (!) 185/67, pulse (!) 52, height '5\' 2"'$  (1.575 m), weight 136 lb (61.7 kg), SpO2 98 %. General: Well developed, well nourished female in no acute distress. Head: Normocephalic, atraumatic, sclera non-icteric, no xanthomas, nares are without discharge. EENT: normal  Lymph Nodes:  none Neck: Negative for carotid bruits. JVD not elevated. Back:without scoliosis kyphosis  Lungs: Clear bilaterally to auscultation without wheezes,  rales, or rhonchi. Breathing is unlabored. Heart: RRR with S1 S2. 2/6 systolic  murmur . No rubs, or gallops appreciated. Abdomen: Soft, non-tender, non-distended with normoactive bowel sounds. No hepatomegaly. No rebound/guarding. No obvious abdominal masses. Msk:  Strength and tone appear normal for age. Extremities: No clubbing or cyanosis. No edema.  Distal pedal pulses are 2+ and equal bilaterally. Skin: Warm and Dry Neuro: Alert and oriented X 3. CN III-XII intact Grossly normal sensory and motor function . Psych:  Responds to questions appropriately with a normal affect.         EKG:   2/23 sinus 45 1 AVB QRSd 135 LBBB 12/22 sinus @ 63        1AVB 240 QRSd 135 LBBB 10/21   Sinus @ 65  1avb 240 QRSd 135 msec LBBB   Assessment and Plan:  Syncope    Hypertension/orthostatic hypotension   Cardiomyopathy-new with distinct wall motion abnormalities       Recurrent syncope with what seems to be quite stereotypical features and that she recognizes it as it comes and recognizes it and its wake.  Many but not all of the episodes are associated with recognizable triggers such as defecation or micturition.  Others have occurred with out such triggers.     Repeated trials have Failed to demonstrate a significant reduction in syncopal events associated with empiric pacing given the high likelihood of concomitant vasomotor syncope likelihood of which is much higher this lady with known orthostasis.   Hence, would not go to pacing.  Is reasonable to consider a loop recorder although I think the value of that is also quite low.   We discussed the physiology of neurally mediated syncope the difficulty managing it.  There was a recent meta-analysis highlighting the potential benefits of midodrine so we will start her on 2.5 mg of midodrine upon arising in the morning  and upon arising from her midday nap.  We have also discussed the use of an abdominal binder.  She has significant systolic  hypertension.  Another strategy might be the addition of pyridostigmine.  Compression might be of benefit as well although tricky in this older lady   We also discussed the findings on the echocardiogram demonstrated not only mild LV dysfunction that was new but distinct wall motion abnormalities to suggest a coronary artery pathology.  They reiterated that at this point in the absence of more distinct symptoms they would like to just treat this conservatively.   We will see her again as needed Virl Axe

## 2021-09-01 ENCOUNTER — Telehealth: Payer: Self-pay | Admitting: Internal Medicine

## 2021-09-01 ENCOUNTER — Institutional Professional Consult (permissible substitution): Payer: Medicare Other | Admitting: Internal Medicine

## 2021-09-01 NOTE — Telephone Encounter (Signed)
[facility] calling with additional abnormal findings ?Transferred to Pam ?

## 2021-09-01 NOTE — Telephone Encounter (Signed)
Sydney Mcdaniel. From [employer] called with abnormal findings on patients monitor. Patient had symptomatic bradycardia on 08/24/21 at 09:36 am with heart rate of 38 lasting 60 seconds. The reason it was not called sooner she reported the button was not pushed and only entry was in diary. Will alert providers of results.  ?

## 2021-09-01 NOTE — Telephone Encounter (Signed)
The patient saw Dr. Caryl Comes in the office on 08/30/21 and I believe he pulled her monitor tracings. ? ? ?Will forward to MD to review.  ?

## 2021-09-06 NOTE — Telephone Encounter (Signed)
?    [facility] call sending a strip that they thought they had not included.  There was a notification on 3/9 although the AT monitor ended on 3/3.  I think the strip of concern is a sinus bradycardia strip from 3/1 which apparently was associated with symptoms.  I do not remember that I saw that when she was here.  I suspect the office was called because this particular page is listed as "additional strips " ?

## 2021-09-06 NOTE — Telephone Encounter (Signed)
Discussed the message below with Dr. Caryl Comes. ?Per MD, he would like to have the patient added on his schedule on Thursday 09/08/21 at 8:00 am for telephone visit to discuss additional tracings. ? ?I have called the patient's daughter, [name] (OK per [facility]) and reviewed that Dr. Caryl Comes had been sent additional monitor tracings on 09/01/21 after her appointment with him on 08/30/21. ?Tracings show documented bradycardia for which the patient triggered her monitor that she was having symptoms with this event on 08/24/21 at ~ 9:30 am. ? ?[name] advised, that per Dr. Caryl Comes, he would like to have a telephone visit with her and the patient this week to discuss the tracings further and determine if this will change her plan of care.  ? ?[name] voices understanding of the above and is agreeable with a telephone visit on 3/16 at 8:00 am ? ?I have asked her to please have the patient obtain a heart rate/ weight/ blood pressure prior to the visit. ?She is aware they will receive a call ~ 10-15 minutes prior to the appointment from our [place] who will review her VS and go over the medications.  ? ?[name] again voices understanding. ? ?She is aware we will need to update a consent for the patient's VV. ?She is agreeable with me sending this through the patient's MyChart. ?I have asked [name] to please review and respond back "I agree." ? ?[name] was very appreciative of the call today.  ? ? ?

## 2021-09-08 ENCOUNTER — Ambulatory Visit (INDEPENDENT_AMBULATORY_CARE_PROVIDER_SITE_OTHER): Payer: Medicare Other | Admitting: Internal Medicine

## 2021-09-08 ENCOUNTER — Other Ambulatory Visit: Payer: Self-pay

## 2021-09-08 ENCOUNTER — Encounter: Payer: Self-pay | Admitting: Internal Medicine

## 2021-09-08 VITALS — BP 135/44 | HR 57 | Ht 62.0 in | Wt 132.0 lb

## 2021-09-08 DIAGNOSIS — R55 Syncope and collapse: Secondary | ICD-10-CM | POA: Diagnosis not present

## 2021-09-08 DIAGNOSIS — R001 Bradycardia, unspecified: Secondary | ICD-10-CM

## 2021-09-08 NOTE — Progress Notes (Addendum)
?  ? ?Electrophysiology TeleHealth Note ? ? ?   See MyChart message from today for the patient's consent to telehealth for [facility]. ? ? ?Date:  09/08/2021  ? ?ID:  Sydney Mcdaniel, DOB 1924-11-30, MRN [id number]  Location: patient's home ? ?Provider location: [address] [place] ? ?Evaluation Performed: Follow-up visit ? ?PCP:  Teodora Medici, DO  ?Cardiologist:   CE/CB ?Electrophysiologist:  SK  ? ?Chief Complaint:  syncope and bradycardia ? ?History of Present Illness:   ? ?Sydney Mcdaniel is a 88 y.o. female who presents via audio/video conferencing for a telehealth visit today.  Since last being seen in our clinic for bradycardia and syncope, thought to be neurally mediated  the patient reports another weak spell with LH. ? ?We were sent additional rhythm tracings after her last visit showing symptomatic sinus bradycardia at a rate of 37 bpm.  This occurred 20 to 40 minutes after an event recorded of lightheadedness while on the commode.  She does not recall the specifics of this. ? ?  ?DATE TEST EF    ?9/20 Echo  60-65%     ?9/20 Myoview   No ischemia   ?1/23 Echo   40-45 % Basilar anteroseptal severe HK  ?         ?  ?Date Cr K Hgb  ?2/23 1.01 4.4 11.2 <<8.9  ?  ?  ?Thromboembolic risk factors ( age -47, HTN-1, Vasc disease -1, Gender-1) for a CHADSVASc Score of >=5 ? ? ?The patient denies symptoms of fevers, chills, cough, or new SOB worrisome for COVID 19.   ? ?Past Medical History:  ?Diagnosis Date  ? Allergy   ? Anemia   ? Cardiomyopathy ([place])   ? a. 02/2019 Echo: EF 60-65%, no rwma, doppler parameters consistent w/ pseudonormalization. Nl RV size/fxn; b. 06/2021 Echo: EF 40-45% w/ sev basal-mid antsept HK. GrII DD. Nl RV size/fxn. Mild MR/AI.  ? Carotid arterial disease ([place])   ? a. 04/2020 Carotid U/S: 1-39% bilat ICA stenosis, <50% bilat ECA stenosis; b. 06/2021 Carotid U/S: RICA 5-39%, LICA 76-73%.  ? Cataract   ? CLL (chronic lymphocytic leukemia) ([place])   ? GERD (gastroesophageal  reflux disease)   ? Glaucoma   ? H/O: hysterectomy   ? Total  ? History of stress test   ? a. 02/2019 MV: EF 60%, no ischemia/infarct.  ? Hyperlipidemia   ? Hypertension   ? Hypothyroidism   ? Impaired fasting glucose   ? Lichen sclerosus   ? Osteoporosis   ? Hips  ? PAF (paroxysmal atrial fibrillation) ([place])   ? a. 06/15/2016 Event monitor: 4% afib burden; b. CHA2DS2VASc - 4-->Xarelto.  ? Sinus bradycardia   ? a. avoid AVN blocking agents.  ? Syncope   ? a. 03/2016 Echo: EF 55-60%, no rwma, mild AI/MR, nl PASP; b. 03/2016 48h Holter: no significant arrhythmias/pauses; c. 03/2016 MV: mild apical defect, likely breast attenuation, nl EF, low risk; d. 05/2016 Event monitor: No significant arrhythmia; e. 05/2016 Event monitor: PAF (4%).  ? ? ?Past Surgical History:  ?Procedure Laterality Date  ? APPENDECTOMY    ? EYE SURGERY    ? Glaucoma  ? MULTIPLE TOOTH EXTRACTIONS N/A   ? 2 teeth  ? TOTAL ABDOMINAL HYSTERECTOMY    ? ? ?Current Outpatient Medications  ?Medication Sig Dispense Refill  ? acetaminophen (TYLENOL) 500 MG tablet Take 1,000 mg by mouth every 8 (eight) hours as needed.    ? alendronate (FOSAMAX) 70  MG tablet TAKE 1 TABLET BY MOUTH ONCE A WEEK WITH A FULL GLASS OF WATER ON AN EMPTY STOMACH 12 tablet 0  ? atorvastatin (LIPITOR) 20 MG tablet Take 20 mg by mouth daily.    ? ferrous sulfate 325 (65 FE) MG tablet Take 1 tablet (325 mg total) by mouth daily with breakfast.  3  ? latanoprost (XALATAN) 0.005 % ophthalmic solution Place 1 drop into the right eye at bedtime.     ? levothyroxine (EUTHYROX) 75 MCG tablet TAKE ONE TABLET BY MOUTH ON MONDAYS, WEDNESDAYS, FRIDAYS, AND SUNDAYS 52 tablet 2  ? levothyroxine (EUTHYROX) 88 MCG tablet TAKE 1 TABLET BY MOUTH ON TUESDAYS, THURSDAYS, AND SATURDAYS 39 tablet 2  ? lisinopril (ZESTRIL) 40 MG tablet Take 1 tablet (40 mg total) by mouth daily. 90 tablet 1  ? midodrine (PROAMATINE) 2.5 MG tablet Take 1 tablet (2.5 mg) by mouth when waking up in the morning & take 1 tablet  (2.5 mg) by mouth after your nap 60 tablet 6  ? Netarsudil Dimesylate (RHOPRESSA) 0.02 % SOLN Place 1 drop into the right eye at bedtime.    ? polyethylene glycol (MIRALAX / GLYCOLAX) 17 g packet Take 17 g by mouth daily.    ? Rivaroxaban (XARELTO) 15 MG TABS tablet Take 1 tablet (15 mg total) by mouth daily with supper. 90 tablet 2  ? senna (SENOKOT) 8.6 MG tablet Take 1 tablet by mouth as needed. Uses only when miralax does not work.    ? spironolactone (ALDACTONE) 25 MG tablet Take 1 tablet (25 mg total) by mouth daily. 90 tablet 3  ? timolol (TIMOPTIC) 0.5 % ophthalmic solution Place 1 drop into the right eye 2 (two) times daily. Instill 1 drop in the morning and 1 drop at 1700    ? vitamin B-12 (CYANOCOBALAMIN) 100 MCG tablet Take 100 mcg by mouth daily.    ? ?No current facility-administered medications for this visit.  ? ? ?Allergies:   Other, Alphagan [brimonidine], Pravachol [pravastatin sodium], and Pravastatin  ? ?ROS:  Please see the history of present illness.   All other systems are personally reviewed and negative.  ? ? ?Exam:   ? ?Vital Signs:  BP (!) 135/44   Pulse (!) 57   Ht '5\' 2"'$  (1.575 m)   Wt 132 lb (59.9 kg)   LMP  (LMP Unknown)   BMI 24.14 kg/m?    ? ?  ? ? ?Labs/Other Tests and Data Reviewed:   ? ?Recent Labs: ?03/03/2021: TSH 2.05 ?05/19/2021: ALT 19; B Natriuretic Peptide 136.5 ?08/02/2021: BUN 25; Creatinine, Ser 1.01; Hemoglobin 11.2; Platelets 182; Potassium 4.4; Sodium 134  ? ?Wt Readings from Last 3 Encounters:  ?09/08/21 132 lb (59.9 kg)  ?08/30/21 136 lb (61.7 kg)  ?08/10/21 135 lb (61.2 kg)  ?  ? ?Other studies personally reviewed: ?Additional studies/ records that were reviewed today include: Event Recorder personnally reviewed    ?  ? ? ? ?ASSESSMENT & PLAN:   ? ?Syncope  ?  ?Hypertension/orthostatic hypotension ? ?Sinus bradycardia ?  ?Cardiomyopathy-new with distinct wall motion abnormalities   ? ? ? ?Syncope has been presumed to be neurally mediated but then had symptomatic  bradycardia unrelated to a normal triggering event.  I think that this latter rhythm may be benefited from pacing, although not entirely sure given the reflex implicated above. ? ?She and her daughter are both aware of the likely incomplete resolution of symptoms-- would use Biotronik with the hope of CLS mitigating  symptoms also ? ?The benefits and risks were reviewed including but not limited to death,  perforation, infection, lead dislodgement and device malfunction.  The patient understands agrees and is willing to proceed. ? ? ? ? ?COVID 19 screen ?The patient denies symptoms of COVID 19 at this time.  The importance of social distancing was discussed today. ? ?Follow-up:  post pacing  ?  ? ?Current medicines are reviewed at length with the patient today.   ?The patient does not have concerns regarding her medicines.  The following changes were made today:  none ? ?Labs/ tests ordered today include: pacemaker implant ?No orders of the defined types were placed in this encounter. ? ? ?  ? ?Patient Risk:  after full review of this patients clinical status, I feel that they are at moderate  risk at this time. ? ?Today, I have spent 31 minutes with the patient with telehealth technology and chart reviewing and discussing the above. ? ?Signed, ?Virl Axe, MD  ?09/08/2021 8:14 AM    ? ?[facility] ?[address] ?[address] ?[place] [address] ?[phone] (office) ?[phone] ? ?

## 2021-09-08 NOTE — Patient Instructions (Addendum)
Medication Instructions:  ?Your physician recommends that you continue on your current medications as directed. Please refer to the Current Medication list given to you today. ? ?*If you need a refill on your cardiac medications before your next appointment, please call your pharmacy* ? ? ?Lab Work: ?CBC and BMET prior to pacemaker implant - to be scheduled ? ?If you have labs (blood work) drawn today and your tests are completely normal, you will receive your results only by: ?MyChart Message (if you have MyChart) OR ?A paper copy in the mail ?If you have any lab test that is abnormal or we need to change your treatment, we will call you to review the results. ? ? ?Testing/Procedures: ?Your physician has recommended that you have a pacemaker inserted. A pacemaker is a small device that is placed under the skin of your chest or abdomen to help control abnormal heart rhythms. This device uses electrical pulses to prompt the heart to beat at a normal rate. Pacemakers are used to treat heart rhythms that are too slow. Wire (leads) are attached to the pacemaker that goes into the chambers of you heart. This is done in the hospital and usually requires and overnight stay. Please see the instruction sheet given to you today for more information.  ? ? ?Follow-Up: ?At [facility], you and your health needs are our priority.  As part of our continuing mission to provide you with exceptional heart care, we have created designated Provider Care Teams.  These Care Teams include your primary Cardiologist (physician) and Advanced Practice Providers (APPs -  Physician Assistants and Nurse Practitioners) who all work together to provide you with the care you need, when you need it. ? ?We recommend signing up for the patient portal called "MyChart".  Sign up information is provided on this After Visit Summary.  MyChart is used to connect with patients for Virtual Visits (Telemedicine).  Patients are able to view lab/test results,  encounter notes, upcoming appointments, etc.  Non-urgent messages can be sent to your provider as well.   ?To learn more about what you can do with MyChart, go to [email or web address].   ? ?Your next appointment:   ?Follow up appointments to be scheduled1}  ? ? ?Other Instructions ? ? ?  ?[facility] ?[address] ?[place], [place]  [address] ?Phone:  [phone]   Fax:  [phone] ? ? ?                           Implantable Device Instructions ?  ?You are scheduled for a Pacemaker Implant on  ?1.   Please arrive to the [facility] main entrance of [facility] at   am on the day of your ?   ?  ?Do not eat or drink the night before your procedure. ?  ?3.   Complete lab work on  ?  You do not have to be fasting. ?  ?4.   Do NOT take your Spironolactone the morning of your procedure.  You may take your other medications with enough water to get them down safely. ?  ?5.  Plan for an overnight stay.   However, you should be discharged the same day as your procedure.  You must have someone that can drive you home.   ?  ?6.  Bring your insurance cards and a list of your medications. ?  ?7.  Wash your chest and neck  with the CHG soap the evening before and the morning of your procedure.  Follow the directions provided to you with the soap. ?  ?                                                     ?* If you have ANY questions after you get home, please call [phone].   ?  ?* Every attempt is made to prevent procedures from being rescheduled.  Due to the nature of    Electrophysiology, rescheduling can happen.  The physician is always aware and directs the staff when this occurs. ?  ?

## 2021-09-08 NOTE — H&P (View-Only) (Signed)
?  ? ?Electrophysiology TeleHealth Note ? ? ?   See MyChart message from today for the patient's consent to telehealth for [facility]. ? ? ?Date:  09/08/2021  ? ?ID:  Sydney Mcdaniel, DOB [birth year], MRN [id number]  Location: patient's home ? ?Provider location: [address] [place] ? ?Evaluation Performed: Follow-up visit ? ?PCP:  Teodora Medici, DO  ?Cardiologist:   CE/CB ?Electrophysiologist:  SK  ? ?Chief Complaint:  syncope and bradycardia ? ?History of Present Illness:   ? ?Sydney Mcdaniel is a 88 y.o. female who presents via audio/video conferencing for a telehealth visit today.  Since last being seen in our clinic for bradycardia and syncope, thought to be neurally mediated  the patient reports another weak spell with LH. ? ?We were sent additional rhythm tracings after her last visit showing symptomatic sinus bradycardia at a rate of 37 bpm.  This occurred 20 to 40 minutes after an event recorded of lightheadedness while on the commode.  She does not recall the specifics of this. ? ?  ?DATE TEST EF    ?9/20 Echo  60-65%     ?9/20 Myoview   No ischemia   ?1/23 Echo   40-45 % Basilar anteroseptal severe HK  ?         ?  ?Date Cr K Hgb  ?2/23 1.01 4.4 11.2 <<8.9  ?  ?  ? ? ?The patient denies symptoms of fevers, chills, cough, or new SOB worrisome for COVID 19.   ? ?Past Medical History:  ?Diagnosis Date  ? Allergy   ? Anemia   ? Cardiomyopathy ([place])   ? a. 02/2019 Echo: EF 60-65%, no rwma, doppler parameters consistent w/ pseudonormalization. Nl RV size/fxn; b. 06/2021 Echo: EF 40-45% w/ sev basal-mid antsept HK. GrII DD. Nl RV size/fxn. Mild MR/AI.  ? Carotid arterial disease ([place])   ? a. 04/2020 Carotid U/S: 1-39% bilat ICA stenosis, <50% bilat ECA stenosis; b. 06/2021 Carotid U/S: RICA 6-44%, LICA 03-47%.  ? Cataract   ? CLL (chronic lymphocytic leukemia) ([place])   ? GERD (gastroesophageal reflux disease)   ? Glaucoma   ? H/O: hysterectomy   ? Total  ? History of stress test   ? a. 02/2019  MV: EF 60%, no ischemia/infarct.  ? Hyperlipidemia   ? Hypertension   ? Hypothyroidism   ? Impaired fasting glucose   ? Lichen sclerosus   ? Osteoporosis   ? Hips  ? PAF (paroxysmal atrial fibrillation) ([place])   ? a. 06/15/2016 Event monitor: 4% afib burden; b. CHA2DS2VASc - 4-->Xarelto.  ? Sinus bradycardia   ? a. avoid AVN blocking agents.  ? Syncope   ? a. 03/2016 Echo: EF 55-60%, no rwma, mild AI/MR, nl PASP; b. 03/2016 48h Holter: no significant arrhythmias/pauses; c. 03/2016 MV: mild apical defect, likely breast attenuation, nl EF, low risk; d. 05/2016 Event monitor: No significant arrhythmia; e. 05/2016 Event monitor: PAF (4%).  ? ? ?Past Surgical History:  ?Procedure Laterality Date  ? APPENDECTOMY    ? EYE SURGERY    ? Glaucoma  ? MULTIPLE TOOTH EXTRACTIONS N/A   ? 2 teeth  ? TOTAL ABDOMINAL HYSTERECTOMY    ? ? ?Current Outpatient Medications  ?Medication Sig Dispense Refill  ? acetaminophen (TYLENOL) 500 MG tablet Take 1,000 mg by mouth every 8 (eight) hours as needed.    ? alendronate (FOSAMAX) 70 MG tablet TAKE 1 TABLET BY MOUTH ONCE A WEEK WITH A FULL GLASS OF WATER ON  AN EMPTY STOMACH 12 tablet 0  ? atorvastatin (LIPITOR) 20 MG tablet Take 20 mg by mouth daily.    ? ferrous sulfate 325 (65 FE) MG tablet Take 1 tablet (325 mg total) by mouth daily with breakfast.  3  ? latanoprost (XALATAN) 0.005 % ophthalmic solution Place 1 drop into the right eye at bedtime.     ? levothyroxine (EUTHYROX) 75 MCG tablet TAKE ONE TABLET BY MOUTH ON MONDAYS, WEDNESDAYS, FRIDAYS, AND SUNDAYS 52 tablet 2  ? levothyroxine (EUTHYROX) 88 MCG tablet TAKE 1 TABLET BY MOUTH ON TUESDAYS, THURSDAYS, AND SATURDAYS 39 tablet 2  ? lisinopril (ZESTRIL) 40 MG tablet Take 1 tablet (40 mg total) by mouth daily. 90 tablet 1  ? midodrine (PROAMATINE) 2.5 MG tablet Take 1 tablet (2.5 mg) by mouth when waking up in the morning & take 1 tablet (2.5 mg) by mouth after your nap 60 tablet 6  ? Netarsudil Dimesylate (RHOPRESSA) 0.02 % SOLN Place  1 drop into the right eye at bedtime.    ? polyethylene glycol (MIRALAX / GLYCOLAX) 17 g packet Take 17 g by mouth daily.    ? Rivaroxaban (XARELTO) 15 MG TABS tablet Take 1 tablet (15 mg total) by mouth daily with supper. 90 tablet 2  ? senna (SENOKOT) 8.6 MG tablet Take 1 tablet by mouth as needed. Uses only when miralax does not work.    ? spironolactone (ALDACTONE) 25 MG tablet Take 1 tablet (25 mg total) by mouth daily. 90 tablet 3  ? timolol (TIMOPTIC) 0.5 % ophthalmic solution Place 1 drop into the right eye 2 (two) times daily. Instill 1 drop in the morning and 1 drop at 1700    ? vitamin B-12 (CYANOCOBALAMIN) 100 MCG tablet Take 100 mcg by mouth daily.    ? ?No current facility-administered medications for this visit.  ? ? ?Allergies:   Other, Alphagan [brimonidine], Pravachol [pravastatin sodium], and Pravastatin  ? ?ROS:  Please see the history of present illness.   All other systems are personally reviewed and negative.  ? ? ?Exam:   ? ?Vital Signs:  BP (!) 135/44   Pulse (!) 57   Ht '5\' 2"'$  (1.575 m)   Wt 132 lb (59.9 kg)   LMP  (LMP Unknown)   BMI 24.14 kg/m?    ? ?  ? ? ?Labs/Other Tests and Data Reviewed:   ? ?Recent Labs: ?03/03/2021: TSH 2.05 ?05/19/2021: ALT 19; B Natriuretic Peptide 136.5 ?08/02/2021: BUN 25; Creatinine, Ser 1.01; Hemoglobin 11.2; Platelets 182; Potassium 4.4; Sodium 134  ? ?Wt Readings from Last 3 Encounters:  ?09/08/21 132 lb (59.9 kg)  ?08/30/21 136 lb (61.7 kg)  ?08/10/21 135 lb (61.2 kg)  ?  ? ?Other studies personally reviewed: ?Additional studies/ records that were reviewed today include: Event Recorder personnally reviewed    ?  ? ? ? ?ASSESSMENT & PLAN:   ? ?Syncope  ?  ?Hypertension/orthostatic hypotension ? ?Sinus bradycardia ?  ?Cardiomyopathy-new with distinct wall motion abnormalities   ? ? ? ?Syncope has been presumed to be neurally mediated but then had symptomatic bradycardia unrelated to a normal triggering event.  I think that this latter rhythm may be benefited  from pacing, although not entirely sure given the reflex implicated above. ? ?She and her daughter are both aware of the likely incomplete resolution of symptoms-- would use Biotronik with the hope of CLS mitigating symptoms also ? ?The benefits and risks were reviewed including but not limited to death,  perforation,  infection, lead dislodgement and device malfunction.  The patient understands agrees and is willing to proceed. ? ? ? ? ?COVID 19 screen ?The patient denies symptoms of COVID 19 at this time.  The importance of social distancing was discussed today. ? ?Follow-up:  post pacing  ?  ? ?Current medicines are reviewed at length with the patient today.   ?The patient does not have concerns regarding her medicines.  The following changes were made today:  none ? ?Labs/ tests ordered today include: pacemaker implant ?No orders of the defined types were placed in this encounter. ? ? ?  ? ?Patient Risk:  after full review of this patients clinical status, I feel that they are at moderate  risk at this time. ? ?Today, I have spent 31 minutes with the patient with telehealth technology and chart reviewing and discussing the above. ? ?Signed, ?Virl Axe, MD  ?09/08/2021 8:14 AM    ? ?[facility] ?[address] ?[address] ?[place] [address] ?[phone] (office) ?[phone] (fax) ? ?

## 2021-09-09 ENCOUNTER — Encounter: Payer: Self-pay | Admitting: *Deleted

## 2021-09-09 ENCOUNTER — Telehealth: Payer: Self-pay | Admitting: Internal Medicine

## 2021-09-09 DIAGNOSIS — Z01812 Encounter for preprocedural laboratory examination: Secondary | ICD-10-CM

## 2021-09-09 DIAGNOSIS — R001 Bradycardia, unspecified: Secondary | ICD-10-CM

## 2021-09-09 DIAGNOSIS — R55 Syncope and collapse: Secondary | ICD-10-CM

## 2021-09-09 NOTE — Telephone Encounter (Signed)
The patient had virtual visit with Dr. Caryl Comes on 09/08/21. ?Patient needs a PPM implant scheduled for syncope/ symptomatic bradycardia. ? ?I called and spoke with the patient's daughter, [name] to advise her of some possible dates that Dr. Caryl Comes can perform the patient's procedure. ? ?Offered 1st available of Thursday 09/22/21 for implant. ? ?Per [name], they prefer to go ahead and bring the patient on 3/30 for her procedure. ? ?[name] is aware the patient will need to come in for pre-procedure lab work. ?She will bring the patient to our office on 09/14/21 at 8:45 am to have this done. ?She is aware I will send detailed copy of instructions to the patient's MyChart, and that I will leave bottle of surgical scrub in the lab for them to pick up at her lab appointment next week.  ? ?[name] voices understanding of the above and is agreeable. ? ? ?

## 2021-09-13 ENCOUNTER — Other Ambulatory Visit (INDEPENDENT_AMBULATORY_CARE_PROVIDER_SITE_OTHER): Payer: Medicare Other

## 2021-09-13 ENCOUNTER — Other Ambulatory Visit: Payer: Self-pay

## 2021-09-13 DIAGNOSIS — R001 Bradycardia, unspecified: Secondary | ICD-10-CM | POA: Diagnosis not present

## 2021-09-13 DIAGNOSIS — R55 Syncope and collapse: Secondary | ICD-10-CM | POA: Diagnosis not present

## 2021-09-13 DIAGNOSIS — Z01812 Encounter for preprocedural laboratory examination: Secondary | ICD-10-CM | POA: Diagnosis not present

## 2021-09-14 ENCOUNTER — Institutional Professional Consult (permissible substitution): Payer: Medicare Other | Admitting: Cardiology

## 2021-09-14 ENCOUNTER — Other Ambulatory Visit: Payer: Medicare Other

## 2021-09-14 LAB — BASIC METABOLIC PANEL
BUN/Creatinine Ratio: 22 (ref 12–28)
BUN: 18 mg/dL (ref 10–36)
CO2: 21 mmol/L (ref 20–29)
Calcium: 9.2 mg/dL (ref 8.7–10.3)
Chloride: 101 mmol/L (ref 96–106)
Creatinine, Ser: 0.83 mg/dL (ref 0.57–1.00)
Glucose: 121 mg/dL — ABNORMAL HIGH (ref 70–99)
Potassium: 4.8 mmol/L (ref 3.5–5.2)
Sodium: 137 mmol/L (ref 134–144)
eGFR: 65 mL/min/{1.73_m2} (ref >59)

## 2021-09-14 LAB — CBC
Hematocrit: 30 % — ABNORMAL LOW (ref 34.0–46.6)
Hemoglobin: 10.1 g/dL — ABNORMAL LOW (ref 11.1–15.9)
MCH: 30.6 pg (ref 26.6–33.0)
MCHC: 33.7 g/dL (ref 31.5–35.7)
MCV: 91 fL (ref 79–97)
Platelets: 239 10*3/uL (ref 150–450)
RBC: 3.3 x10E6/uL — ABNORMAL LOW (ref 3.77–5.28)
RDW: 14.1 % (ref 11.7–15.4)
WBC: 6.8 10*3/uL (ref 3.4–10.8)

## 2021-09-21 ENCOUNTER — Ambulatory Visit: Payer: Medicare Other | Admitting: Nurse Practitioner

## 2021-09-21 NOTE — Pre-Procedure Instructions (Signed)
Spoke with daughter regarding instructions.  Instructed on the following items: ?Arrival time now is 0530 ?Nothing to eat or drink after midnight ?No meds AM of procedure ?Responsible person to drive you home and stay with you for 24 hrs ?Wash with special soap night before and morning of procedure ?If on anti-coagulant drug instructions Xarelto- hold tonight's dose.  ?

## 2021-09-22 ENCOUNTER — Other Ambulatory Visit: Payer: Self-pay

## 2021-09-22 ENCOUNTER — Ambulatory Visit (HOSPITAL_COMMUNITY)
Admission: RE | Admit: 2021-09-22 | Discharge: 2021-09-22 | Disposition: A | Discharge status: Home / Self Care | Payer: Medicare Other | Attending: Internal Medicine | Admitting: Internal Medicine

## 2021-09-22 ENCOUNTER — Ambulatory Visit (HOSPITAL_COMMUNITY): Payer: Medicare Other

## 2021-09-22 ENCOUNTER — Encounter: Payer: Self-pay | Admitting: Internal Medicine

## 2021-09-22 ENCOUNTER — Encounter (HOSPITAL_COMMUNITY)
Admission: RE | Disposition: A | Discharge status: Home / Self Care | Payer: Self-pay | Source: Home / Self Care | Attending: Internal Medicine

## 2021-09-22 DIAGNOSIS — I10 Essential (primary) hypertension: Secondary | ICD-10-CM | POA: Insufficient documentation

## 2021-09-22 DIAGNOSIS — I480 Paroxysmal atrial fibrillation: Secondary | ICD-10-CM | POA: Insufficient documentation

## 2021-09-22 DIAGNOSIS — I429 Cardiomyopathy, unspecified: Secondary | ICD-10-CM | POA: Insufficient documentation

## 2021-09-22 DIAGNOSIS — Z79899 Other long term (current) drug therapy: Secondary | ICD-10-CM | POA: Insufficient documentation

## 2021-09-22 DIAGNOSIS — E039 Hypothyroidism, unspecified: Secondary | ICD-10-CM | POA: Diagnosis not present

## 2021-09-22 DIAGNOSIS — I495 Sick sinus syndrome: Secondary | ICD-10-CM | POA: Diagnosis not present

## 2021-09-22 DIAGNOSIS — R55 Syncope and collapse: Secondary | ICD-10-CM | POA: Insufficient documentation

## 2021-09-22 DIAGNOSIS — K219 Gastro-esophageal reflux disease without esophagitis: Secondary | ICD-10-CM | POA: Diagnosis not present

## 2021-09-22 DIAGNOSIS — Z950 Presence of cardiac pacemaker: Secondary | ICD-10-CM | POA: Diagnosis not present

## 2021-09-22 DIAGNOSIS — I6523 Occlusion and stenosis of bilateral carotid arteries: Secondary | ICD-10-CM | POA: Diagnosis not present

## 2021-09-22 DIAGNOSIS — I700 Atherosclerosis of aorta: Secondary | ICD-10-CM | POA: Diagnosis not present

## 2021-09-22 DIAGNOSIS — I517 Cardiomegaly: Secondary | ICD-10-CM | POA: Diagnosis not present

## 2021-09-22 DIAGNOSIS — Z79890 Hormone replacement therapy (postmenopausal): Secondary | ICD-10-CM | POA: Diagnosis not present

## 2021-09-22 DIAGNOSIS — E785 Hyperlipidemia, unspecified: Secondary | ICD-10-CM | POA: Insufficient documentation

## 2021-09-22 HISTORY — PX: PACEMAKER IMPLANT: EP1218

## 2021-09-22 SURGERY — PACEMAKER IMPLANT

## 2021-09-22 MED ORDER — SODIUM CHLORIDE 0.9 % IV SOLN
INTRAVENOUS | Status: AC
  Filled 2021-09-22: qty 2

## 2021-09-22 MED ORDER — ONDANSETRON HCL 4 MG/2ML IJ SOLN: 4.0000 mg | Freq: Four times a day (QID) | INTRAMUSCULAR | Status: DC | PRN

## 2021-09-22 MED ORDER — LISINOPRIL 20 MG PO TABS
40.0000 mg | ORAL_TABLET | Freq: Once | ORAL | Status: AC
  Administered 2021-09-22: 40 mg via ORAL
  Filled 2021-09-22: qty 2

## 2021-09-22 MED ORDER — HYDRALAZINE HCL 20 MG/ML IJ SOLN
INTRAMUSCULAR | Status: AC
  Filled 2021-09-22: qty 1

## 2021-09-22 MED ORDER — SODIUM CHLORIDE 0.9 % IV SOLN: INTRAVENOUS | Status: DC

## 2021-09-22 MED ORDER — LIDOCAINE HCL (PF) 1 % IJ SOLN
INTRAMUSCULAR | Status: AC
  Filled 2021-09-22: qty 60

## 2021-09-22 MED ORDER — FENTANYL CITRATE (PF) 100 MCG/2ML IJ SOLN
INTRAMUSCULAR | Status: AC
  Filled 2021-09-22: qty 2

## 2021-09-22 MED ORDER — ACETAMINOPHEN 325 MG PO TABS
325.0000 mg | ORAL_TABLET | ORAL | Status: DC | PRN
  Administered 2021-09-22: 650 mg via ORAL
  Filled 2021-09-22: qty 2

## 2021-09-22 MED ORDER — CHLORHEXIDINE GLUCONATE 4 % EX LIQD: 4.0000 "application " | Freq: Once | CUTANEOUS | Status: DC

## 2021-09-22 MED ORDER — SODIUM CHLORIDE 0.9 % IV SOLN
80.0000 mg | INTRAVENOUS | Status: AC
  Administered 2021-09-22: 80 mg

## 2021-09-22 MED ORDER — LIDOCAINE HCL (PF) 1 % IJ SOLN
INTRAMUSCULAR | Status: DC | PRN
  Administered 2021-09-22: 60 mL

## 2021-09-22 MED ORDER — HEPARIN (PORCINE) IN NACL 1000-0.9 UT/500ML-% IV SOLN
INTRAVENOUS | Status: AC
  Filled 2021-09-22: qty 500

## 2021-09-22 MED ORDER — POVIDONE-IODINE 10 % EX SWAB: 2.0000 "application " | Freq: Once | CUTANEOUS | Status: DC

## 2021-09-22 MED ORDER — CEFAZOLIN SODIUM-DEXTROSE 2-4 GM/100ML-% IV SOLN
2.0000 g | INTRAVENOUS | Status: AC
  Administered 2021-09-22: 2 g via INTRAVENOUS

## 2021-09-22 MED ORDER — SPIRONOLACTONE 25 MG PO TABS
25.0000 mg | ORAL_TABLET | Freq: Once | ORAL | Status: AC
  Administered 2021-09-22: 25 mg via ORAL
  Filled 2021-09-22: qty 1

## 2021-09-22 MED ORDER — IOHEXOL 350 MG/ML SOLN
INTRAVENOUS | Status: DC | PRN
  Administered 2021-09-22: 15 mL

## 2021-09-22 MED ORDER — HEPARIN (PORCINE) IN NACL 1000-0.9 UT/500ML-% IV SOLN
INTRAVENOUS | Status: DC | PRN
  Administered 2021-09-22: 500 mL

## 2021-09-22 MED ORDER — CEFAZOLIN SODIUM-DEXTROSE 2-4 GM/100ML-% IV SOLN
INTRAVENOUS | Status: AC
  Filled 2021-09-22: qty 100

## 2021-09-22 MED ORDER — FENTANYL CITRATE (PF) 100 MCG/2ML IJ SOLN
INTRAMUSCULAR | Status: DC | PRN
Start: 2021-09-22 — End: 2021-09-22
  Administered 2021-09-22: 12.5 ug via INTRAVENOUS

## 2021-09-22 MED ORDER — HYDRALAZINE HCL 20 MG/ML IJ SOLN
10.0000 mg | Freq: Once | INTRAMUSCULAR | Status: AC
  Administered 2021-09-22: 10 mg via INTRAVENOUS

## 2021-09-22 SURGICAL SUPPLY — 10 items
CABLE SURGICAL S-101-97-12 (CABLE) ×2 IMPLANT
HEMOSTAT SURGICEL 2X4 FIBR (HEMOSTASIS) ×1 IMPLANT
KIT MICROPUNCTURE NIT STIFF (SHEATH) ×1 IMPLANT
LEAD SOLIA JT 45 399626 (Lead) ×1 IMPLANT
LEAD SOLIA T 53 377180 (Lead) ×1 IMPLANT
PACEMAKER EDORA 8DR-T MRI (Pacemaker) ×1 IMPLANT
PAD DEFIB RADIO PHYSIO CONN (PAD) ×2 IMPLANT
SHEATH 7FR PRELUDE SNAP 13 (SHEATH) ×2 IMPLANT
SHEATH 9FR PRELUDE SNAP 13 (SHEATH) IMPLANT
TRAY PACEMAKER INSERTION (PACKS) ×2 IMPLANT

## 2021-09-22 NOTE — Progress Notes (Signed)
Radiology at bedside, CXR completed, safety maintained ? ?

## 2021-09-22 NOTE — Progress Notes (Signed)
Pt to radiology for Xray. ?

## 2021-09-22 NOTE — Progress Notes (Signed)
Dr Caryl Comes informed of CXR results.  States OK to discharge home. ?

## 2021-09-22 NOTE — Progress Notes (Signed)
Dr. Caryl Comes at bedside, ok for pt to transfer to Short Stay, safety maintained ?

## 2021-09-22 NOTE — Interval H&P Note (Signed)
History and Physical Interval Note: ? ?09/22/2021 ?7:28 AM ? ?Sydney Mcdaniel  has presented today for surgery, with the diagnosis of symptomatic bradycardia.  The various methods of treatment have been discussed with the patient and family. After consideration of risks, benefits and other options for treatment, the patient has consented to  Procedure(s): ?PACEMAKER IMPLANT (N/A) as a surgical intervention.  The patient's history has been reviewed, patient examined, no change in status, stable for surgery.  I have reviewed the patient's chart and labs.  Questions were answered to the patient's satisfaction.   ? ? ?Virl Axe ? ? ?

## 2021-09-22 NOTE — Progress Notes (Signed)
Purewick placed by debbie, RN, tolerated well ?

## 2021-09-22 NOTE — Progress Notes (Unsigned)
Pt found to be in afib in the lab-- has been noted in the more remote past but not recently. ? ?Not clearly symptomatic, but will anticipate DCCV if does not revert on  her own ? ?Procedure notable for LBBB so as forsook LBB pacing as was going to use a passive atrial lead so as to decrease risk of cardiac perforation in this old lady we use two passive leads ? ?Access was difficult requiring venogram and possible with subclavicular ( micro) needle pass   O2 sats stable, CXR pending    ?

## 2021-09-22 NOTE — Discharge Instructions (Addendum)
After Your Pacemaker ? ? ?You have a Biotronik  Pacemaker ? ?ACTIVITY ?Do not lift your arm above shoulder height for 1 week after your procedure. After 7 days, you may progress as below.  ?You should remove your sling 24 hours after your procedure, unless otherwise instructed by your provider.  ? ? ? Thursday September 29, 2021  Friday September 30, 2021 Saturday October 01, 2021 Sunday October 02, 2021  ? ?Do not lift, push, pull, or carry anything over 10 pounds with the affected arm until 6 weeks (Thursday Nov 03, 2021 ) after your procedure.  ? ?You may drive AFTER your wound check, unless you have been told otherwise by your provider.  ? ?Ask your healthcare provider when you can go back to work ? ? ?INCISION/Dressing ?If you are on a blood thinner such as Coumadin, Xarelto, Eliquis, Plavix, or Pradaxa please confirm with your provider when this should be resumed.  ? ?If large square, outer bandage is left in place, this can be removed after 24 hours from your procedure. Do not remove steri-strips or glue as below.  ? ?Monitor your Pacemaker site for redness, swelling, and drainage. Call the device clinic at [phone] if you experience these symptoms or fever/chills. ? ?Implantable Cardiac Device Battery Change, Care After ? ?This sheet gives you information about how to care for yourself after your procedure. Your health care provider may also give you more specific instructions. If you have problems or questions, contact your health care provider. ?What can I expect after the procedure? ?After your procedure, it is common to have: ?Pain or soreness at the site where the cardiac device was inserted. ?Swelling at the site where the cardiac device was inserted. ?You should received an information card for your new device in 4-8 weeks. ?Follow these instructions at home: ?Incision care  ?Keep the incision clean and dry. ?Do not take baths, swim, or use a hot tub until after your wound check.  ?Do not shower for at least 7  days, or as directed by your health care provider. ?Pat the area dry with a clean towel. Do not rub the area. This may cause bleeding. ?Follow instructions from your health care provider about how to take care of your incision. Make sure you: ?Leave stitches (sutures), skin glue, or adhesive strips in place. These skin closures may need to stay in place for 2 weeks or longer. If adhesive strip edges start to loosen and curl up, you may trim the loose edges. Do not remove adhesive strips completely unless your health care provider tells you to do that. ?Check your incision area every day for signs of infection. Check for: ?More redness, swelling, or pain. ?More fluid or blood. ?Warmth. ?Pus or a bad smell. ?Activity ?Do not lift anything that is heavier than 10 lb (4.5 kg) until your health care provider says it is okay to do so. ?For the first week, or as long as told by your health care provider: ?Avoid lifting your affected arm higher than your shoulder. ?After 1 week, Be gentle when you move your arms over your head. It is okay to raise your arm to comb your hair. ?Avoid strenuous exercise. ?Ask your health care provider when it is okay to: ?Resume your normal activities. ?Return to work or school. ?Resume sexual activity. ?Eating and drinking ?Eat a heart-healthy diet. This should include plenty of fresh fruits and vegetables, whole grains, low-fat dairy products, and lean protein like chicken and fish. ?Limit alcohol  intake to no more than 1 drink a day for non-pregnant women and 2 drinks a day for men. One drink equals 12 oz of beer, 5 oz of wine, or 1? oz of hard liquor. ?Check ingredients and nutrition facts on packaged foods and beverages. Avoid the following types of food: ?Food that is high in salt (sodium). ?Food that is high in saturated fat, like full-fat dairy or red meat. ?Food that is high in trans fat, like fried food. ?Food and drinks that are high in sugar. ?Lifestyle ?Do not use any products  that contain nicotine or tobacco, such as cigarettes and e-cigarettes. If you need help quitting, ask your health care provider. ?Take steps to manage and control your weight. ?Once cleared, get regular exercise. Aim for 150 minutes of moderate-intensity exercise (such as walking or yoga) or 75 minutes of vigorous exercise (such as running or swimming) each week. ?Manage other health problems, such as diabetes or high blood pressure. Ask your health care provider how you can manage these conditions. ?General instructions ?Do not drive for 24 hours after your procedure if you were given a medicine to help you relax (sedative). ?Take over-the-counter and prescription medicines only as told by your health care provider. ?Avoid putting pressure on the area where the cardiac device was placed. ?If you need an MRI after your cardiac device has been placed, be sure to tell the health care provider who orders the MRI that you have a cardiac device. ?Avoid close and prolonged exposure to electrical devices that have strong magnetic fields. These include: ?Cell phones. Avoid keeping them in a pocket near the cardiac device, and try using the ear opposite the cardiac device. ?MP3 players. ?Household appliances, like microwaves. ?Metal detectors. ?Electric generators. ?High-tension wires. ?Keep all follow-up visits as directed by your health care provider. This is important. ?Contact a health care provider if: ?You have pain at the incision site that is not relieved by over-the-counter or prescription medicines. ?You have any of these around your incision site or coming from it: ?More redness, swelling, or pain. ?Fluid or blood. ?Warmth to the touch. ?Pus or a bad smell. ?You have a fever. ?You feel brief, occasional palpitations, light-headedness, or any symptoms that you think might be related to your heart. ?Get help right away if: ?You experience chest pain that is different from the pain at the cardiac device site. ?You  develop a red streak that extends above or below the incision site. ?You experience shortness of breath. ?You have palpitations or an irregular heartbeat. ?You have light-headedness that does not go away quickly. ?You faint or have dizzy spells. ?Your pulse suddenly drops or increases rapidly and does not return to normal. ?You begin to gain weight and your legs and ankles swell. ?Summary ?After your procedure, it is common to have pain, soreness, and some swelling where the cardiac device was inserted. ?Make sure to keep your incision clean and dry. Follow instructions from your health care provider about how to take care of your incision. ?Check your incision every day for signs of infection, such as more pain or swelling, pus or a bad smell, warmth, or leaking fluid and blood. ?Avoid strenuous exercise and lifting your left arm higher than your shoulder for 2 weeks, or as long as told by your health care provider. ?This information is not intended to replace advice given to you by your health care provider. Make sure you discuss any questions you have with your health care provider. .   ? ?  If you were discharged in a sling, please do not wear this during the day more than 48 hours after your surgery unless otherwise instructed. This may increase the risk of stiffness and soreness in your shoulder.  ? ?Avoid lotions, ointments, or perfumes over your incision until it is well-healed. ? ?You may use a hot tub or a pool AFTER your wound check appointment if the incision is completely closed. ? ?Pacemaker Alerts:  Some alerts are vibratory and others beep. These are NOT emergencies. Please call our office to let us know. If this occurs at night or on weekends, it can wait until the next business day. Send a remote transmission. ? ?If your device is capable of reading fluid status (for heart failure), you will be offered monthly monitoring to review this with you.  ? ?DEVICE MANAGEMENT ?Remote monitoring is used to  monitor your pacemaker from home. This monitoring is scheduled every 91 days by our office. It allows [place] to keep an eye on the functioning of your device to ensure it is working properly. You will routinely see

## 2021-09-23 ENCOUNTER — Telehealth: Payer: Self-pay

## 2021-09-23 ENCOUNTER — Encounter (HOSPITAL_COMMUNITY): Payer: Self-pay | Admitting: Internal Medicine

## 2021-09-23 NOTE — Telephone Encounter (Signed)
-----   Message from Shirley Friar, PA-C sent at 09/22/2021  2:57 PM EDT ----- ?Same day PPM SK 3/30 ? ?

## 2021-09-23 NOTE — Telephone Encounter (Signed)
Discussed following with EC [name], daughter: ? ?Follow-up after same day discharge: ?Implant date: 09/22/2021 ?MD: Virl Axe, MD ?Device: Biotronik PPM ?Location: left chest ? ? ?Wound check visit: 10/06/21 at 10:00 am ?90 day MD follow-up: 12/29/2021 at 10:40 am ? ?Remote Transmission received:yes ? ?Dressing removed: yes-Pt has dermabond  ? ?Xarelto-resume 09/25/2021 ? ? ? ?

## 2021-09-30 ENCOUNTER — Other Ambulatory Visit: Payer: Self-pay | Admitting: Unknown Physician Specialty

## 2021-09-30 DIAGNOSIS — M810 Age-related osteoporosis without current pathological fracture: Secondary | ICD-10-CM

## 2021-10-03 ENCOUNTER — Ambulatory Visit: Payer: PRIVATE HEALTH INSURANCE | Admitting: Internal Medicine

## 2021-10-03 ENCOUNTER — Other Ambulatory Visit: Payer: Self-pay

## 2021-10-03 DIAGNOSIS — M810 Age-related osteoporosis without current pathological fracture: Secondary | ICD-10-CM

## 2021-10-03 NOTE — Telephone Encounter (Signed)
Requested medication (s) are due for refill today: Yes ? ?Requested medication (s) are on the active medication list: Yes ? ?Last refill:  05/12/21 ? ?Future visit scheduled: No ? ?Notes to clinic:  Protocol indicates pt. Needs lab work. ? ? ? ?Requested Prescriptions  ?Pending Prescriptions Disp Refills  ? alendronate (FOSAMAX) 70 MG tablet [Pharmacy Med Name: Alendronate Sodium 70 MG Oral Tablet] 12 tablet 0  ?  Sig: TAKE 1 TABLET BY MOUTH ONCE A WEEK ON AN EMPTY STOMACH WITH  A  FULL  GLASS  OF  WATER  ?  ? Endocrinology:  Bisphosphonates Failed - 09/30/2021 11:40 AM  ?  ?  Failed - Vitamin D in normal range and within 360 days  ?  Vit D, 25-Hydroxy  ?Date Value Ref Range Status  ?09/09/2018 35 30 - 100 ng/mL Final  ?  Comment:  ?  Vitamin D Status         25-OH Vitamin D: ?. ?Deficiency:                    <20 ng/mL ?Insufficiency:             20 - 29 ng/mL ?Optimal:                 > or = 30 ng/mL ?Marland Kitchen ?For 25-OH Vitamin D testing on patients on  ?D2-supplementation and patients for whom quantitation  ?of D2 and D3 fractions is required, the QuestAssureD(TM) ?25-OH VIT D, (D2,D3), LC/MS/MS is recommended: order  ?code [phone] (patients >77yr). ?. ?For more information on this test, go to: ?http://education.questdiagnostics.com/faq/FAQ163 ?(This link is being provided for  ?informational/educational purposes only.) ?  ?  ?  ?  ?  Failed - Mg Level in normal range and within 360 days  ?  Magnesium  ?Date Value Ref Range Status  ?04/13/2020 2.0 1.7 - 2.4 mg/dL Final  ?  Comment:  ?  [facility]formed [facility] [address], [place] [address] ?  ?  ?  ?  Failed - Phosphate in normal range and within 360 days  ?  No results found for: PHOS  ?  ?  ?  Failed - Bone Mineral Density or Dexa Scan completed in the last 2 years  ?  ?  Passed - Ca in normal range and within 360 days  ?  Calcium  ?Date Value Ref Range Status  ?09/13/2021 9.2 8.7 - 10.3 mg/dL Final  ?  ?  ?  ?  Passed - Cr in normal range and  within 360 days  ?  Creat  ?Date Value Ref Range Status  ?05/05/2021 0.79 0.60 - 0.95 mg/dL Final  ? ?Creatinine, Ser  ?Date Value Ref Range Status  ?09/13/2021 0.83 0.57 - 1.00 mg/dL Final  ? ?Creatinine, Urine  ?Date Value Ref Range Status  ?02/11/2021 23 mg/dL Final  ?  Comment:  ?  Performed [facility] [address] [address] ?  ?  ?  ?  Passed - eGFR is 30 or above and within 360 days  ?  GFR, Est African American  ?Date Value Ref Range Status  ?10/01/2020 75 > OR = 60 mL/min/1.761mFinal  ? ?GFR, Est Non African American  ?Date Value Ref Range Status  ?10/01/2020 65 > OR = 60 mL/min/1.7370minal  ? ?GFR, Estimated  ?Date Value Ref Range Status  ?08/02/2021 52 (L) >60 mL/min Final  ?  Comment:  ?  (NOTE) ?Calculated using the CKD-EPI Creatinine Equation (2021) ?  ? ?eGFR  ?Date Value Ref Range Status  ?09/13/2021 65 >59 mL/min/1.73 Final  ?  ?  ?  ?  Passed - Valid encounter within last 12 months  ?  Recent Outpatient Visits   ? ?      ? 3 months ago Aspiration pneumonia of right lower lobe, unspecified aspiration pneumonia type ([place])  ? [place], NP  ? 5 months ago Hyponatremia  ? [place], NP  ? 6 months ago Hyponatremia  ? [place], DO  ? 6 months ago Need for influenza vaccination  ? [place], NP  ? 7 months ago Weight loss  ? [facility] Kathrine Haddock, NP  ? ?  ?  ?Future Appointments   ? ?        ? In 1 month Teodora Medici, [facility], [place]  ? In 9 months  [place]  ? ?  ? ?  ?  ?  ? ?

## 2021-10-05 ENCOUNTER — Ambulatory Visit (INDEPENDENT_AMBULATORY_CARE_PROVIDER_SITE_OTHER): Payer: Medicare Other

## 2021-10-05 ENCOUNTER — Encounter: Payer: Self-pay | Admitting: Hematology and Oncology

## 2021-10-05 DIAGNOSIS — R001 Bradycardia, unspecified: Secondary | ICD-10-CM | POA: Diagnosis not present

## 2021-10-05 LAB — CUP PACEART INCLINIC DEVICE CHECK
Date Time Interrogation Session: 20230412170250
Implantable Lead Implant Date: 20230330
Implantable Lead Implant Date: 20230330
Implantable Lead Location: 753859
Implantable Lead Location: 753860
Implantable Lead Model: 377
Implantable Lead Model: 399
Implantable Lead Serial Number: 8000191707
Implantable Lead Serial Number: 8000584009
Implantable Pulse Generator Implant Date: 20230330
Pulse Gen Model: 407145
Pulse Gen Serial Number: 70372674

## 2021-10-05 NOTE — Patient Instructions (Addendum)
? ?  After Your Pacemaker ? ? ?Monitor your pacemaker site for redness, swelling, and drainage. Call the device clinic at [phone] if you experience these symptoms or fever/chills. ? ?Your incision was closed with Dermabond:  You may shower 1 day after your defibrillator implant and wash your incision with soap and water. Avoid lotions, ointments, or perfumes over your incision until it is well-healed. ? ?You may use a hot tub or a pool after your wound check appointment if the incision is completely closed. ? ?Do not lift, push or pull greater than 10 pounds with the affected arm until 6 weeks after your procedure. There are no other restrictions in arm movement after your wound check appointment. May 11 ? ?You may drive, unless driving has been restricted by your healthcare providers. ? ?Your Pacemaker is MRI compatible. ? ?Remote monitoring is used to monitor your pacemaker from home. This monitoring is scheduled every 91 days by our office. It allows [place] to keep an eye on the functioning of your device to ensure it is working properly. You will routinely see your Electrophysiologist annually (more often if necessary).  ?

## 2021-10-05 NOTE — Progress Notes (Signed)
Wound check appointment. Steri-strips removed. Wound without redness or edema. Incision edges approximated, wound well healed. Normal device function. Thresholds, sensing, and impedances consistent with implant measurements. Device programmed at 3.0V/auto capture programmed on for extra safety margin until 3 month visit. Histogram distribution appropriate for patient and level of activity. No mode switches or high ventricular rates noted. Patient admitted to mechanical fall 4 days ago and did not seek medical attention. See epic note for pics. Discussed with SK. No history of stroke or TIA. Xarelto held for 7 days. Patient educated about wound care, arm mobility, lifting restrictions. Enrolled in remote monitoring with next transmission scheduled 12/23/21. ROV in 3 months with implanting physician. see attachment for parameters and testing results. ? ? ? ? ? ?

## 2021-10-06 ENCOUNTER — Ambulatory Visit: Payer: Medicare Other

## 2021-10-11 DIAGNOSIS — Z20822 Contact with and (suspected) exposure to covid-19: Secondary | ICD-10-CM | POA: Diagnosis not present

## 2021-10-20 DIAGNOSIS — Z20822 Contact with and (suspected) exposure to covid-19: Secondary | ICD-10-CM | POA: Diagnosis not present

## 2021-10-26 DIAGNOSIS — Z20822 Contact with and (suspected) exposure to covid-19: Secondary | ICD-10-CM | POA: Diagnosis not present

## 2021-11-02 ENCOUNTER — Ambulatory Visit (INDEPENDENT_AMBULATORY_CARE_PROVIDER_SITE_OTHER): Payer: Medicare Other | Admitting: Internal Medicine

## 2021-11-02 ENCOUNTER — Encounter: Payer: Self-pay | Admitting: Internal Medicine

## 2021-11-02 VITALS — BP 142/80 | HR 100 | Temp 98.3°F | Resp 16 | Ht 62.0 in | Wt 136.9 lb

## 2021-11-02 DIAGNOSIS — R262 Difficulty in walking, not elsewhere classified: Secondary | ICD-10-CM | POA: Diagnosis not present

## 2021-11-02 DIAGNOSIS — E039 Hypothyroidism, unspecified: Secondary | ICD-10-CM

## 2021-11-02 DIAGNOSIS — I10 Essential (primary) hypertension: Secondary | ICD-10-CM

## 2021-11-02 DIAGNOSIS — E222 Syndrome of inappropriate secretion of antidiuretic hormone: Secondary | ICD-10-CM

## 2021-11-02 DIAGNOSIS — I480 Paroxysmal atrial fibrillation: Secondary | ICD-10-CM | POA: Diagnosis not present

## 2021-11-02 NOTE — Assessment & Plan Note (Addendum)
Stable rate controlled.  She is on Xarelto 15 mg daily.  We did discuss how being on the blood thinner increases her risk of adverse event from falls and need to focus on fall prevention.  Again this is managed by cardiology and she sees them in July. ?

## 2021-11-02 NOTE — Patient Instructions (Addendum)
It was great seeing you today! ? ?Plan discussed at today's visit: ?-No changes to  medications made today  ?-Continue to check blood pressure at home ?-Add Vitamin D 1000 IU over the counter daily to help strengthen bones  ? ?Follow up in: 3 months  ? ?Take care and let us know if you have any questions or concerns prior to your next visit. ? ?Dr. Rosana Berger ? ?

## 2021-11-02 NOTE — Progress Notes (Signed)
? ?Established Patient Office Visit ? ?Subjective   ?Patient ID: Sydney Mcdaniel, female    DOB: 1924/11/25  Age: 88 y.o. MRN: [id number] ? ?Chief Complaint  ?Patient presents with  ? Follow-up  ? Hypothyroidism  ? Hypertension  ? ? ?HPI ?Sydney Mcdaniel is a 88 year old female presenting for follow up on chronic medical conditions.  ? ?Hypertension/A.Fib/CHF: ?-Medications: Lisinopril 40, Xarelto 15 mg, Spirolactone 25 mg. Also on Midodrine 2.25 BID prior to pacemaker. Amlodipine had been discontinued previously due to hypotension and bradycardia ?-Patient is compliant with above medications and reports no side effects at this point. ?-Checking BP at home (average): 125-140/54-80 ?-Denies any SOB, CP, vision changes, LE edema or symptoms of hypotension ?-Following with Cardiology, Dr. Jens Som, seeing him in July for recheck ?-Last TTE in 1/23 with EF of 40-45%, euvolemic today ?-ICD implanted on 09/22/21, checked on 10/05/21. Doing very well since pacemaker was implanted.  Blood pressure has been stable.  She denies chest pain, palpitations, shortness of breath, dizziness, new focal weakness, imbalance issues. No recent falls since the pacemaker was implanted. Last fall in March.  She would like to be evaluated by physical therapy, as she does feel overall she is getting weaker due to age.  She had been evaluated previously by PT at the hospital and did well with that.  She does ambulate with some sort of assisted device, including cane or walker at home. ? ?HLD: ?-Medications: Lipitor 20 mg ?-Patient is compliant with above medications and reports no side effects.  ?-Last lipid panel: Lipid Panel  ?   ?Component Value Date/Time  ? CHOL 171 11/17/2020 0855  ? TRIG 147 11/17/2020 0855  ? HDL 55 11/17/2020 0855  ? CHOLHDL 3.1 11/17/2020 0855  ? CHOLHDL 3.1 07/30/2019 0925  ? VLDL 22 07/30/2019 0925  ? [place] 90 11/17/2020 0855  ? [place] 90 07/02/2018 1040  ? LABVLDL 26 11/17/2020 0855  ? ?Hypothyroidism: ?-Medications:  Levothyroxine 88 mcg on Tuesdays, Thursdays and Saturdays and 75 mcg on Mondays, Wednesdays, Fridays and Sundays ?-Patient is compliant with the above medication (s) at the above dose and reports no medication side effects.  ?-Denies weight changes, cold./heat intolerance, skin changes, anxiety/palpitations  ?-Last TSH: 9/22 2.05, thyroid panel normal  ? ?SIADH: ?-Following with Nephrology at [facility], note reviewed from 06/20/21. Seeing him again tomorrow  ?-On fluid restriction, last BMP showing sodium on 137 on 09/13/21 ? ?Osteoporosis:  ?-Currently on Fosamax 70 mg weekly for 2 years at this point  ?-DEXA results from 2/20 showed t score 1.4, left femur -1.4 and in left forearm -2.8.  ?-Not currently on Vitamin D or calcium  ? ? ? ?Review of Systems  ?Constitutional:  Negative for chills and fever.  ?Eyes:  Negative for blurred vision.  ?Respiratory:  Negative for cough and shortness of breath.   ?Cardiovascular:  Negative for chest pain, palpitations and leg swelling.  ?Musculoskeletal:  Negative for falls.  ?Neurological:  Positive for weakness. Negative for dizziness and headaches.  ? ?  ?Objective:  ?  ? ?BP (!) 142/80   Pulse 100   Temp 98.3 ?F (36.8 ?C)   Resp 16   Ht '5\' 2"'  (1.575 m)   Wt 136 lb 14.4 oz (62.1 kg)   LMP  (LMP Unknown)   SpO2 98%   BMI 25.04 kg/m?  ?BP Readings from Last 3 Encounters:  ?11/02/21 (!) 142/80  ?09/22/21 (!) 117/43  ?09/08/21 (!) 135/44  ? ?Wt Readings from  Last 3 Encounters:  ?11/02/21 136 lb 14.4 oz (62.1 kg)  ?09/22/21 133 lb (60.3 kg)  ?09/08/21 132 lb (59.9 kg)  ? ?  ? ?Physical Exam ?Constitutional:   ?   Appearance: Normal appearance.  ?   Comments: Ambulating with a cane  ?HENT:  ?   Head: Normocephalic and atraumatic.  ?Eyes:  ?   Conjunctiva/sclera: Conjunctivae normal.  ?Cardiovascular:  ?   Rate and Rhythm: Normal rate and regular rhythm.  ?Pulmonary:  ?   Effort: Pulmonary effort is normal.  ?   Breath sounds: Normal breath sounds.   ?Musculoskeletal:  ?   Right lower leg: No edema.  ?   Left lower leg: No edema.  ?Skin: ?   General: Skin is warm and dry.  ?Neurological:  ?   General: No focal deficit present.  ?   Mental Status: She is alert. Mental status is at baseline.  ?Psychiatric:     ?   Mood and Affect: Mood normal.     ?   Behavior: Behavior normal.  ? ? ? ?No results found for any visits on 11/02/21. ? ?Last CBC ?Lab Results  ?Component Value Date  ? WBC 6.8 09/13/2021  ? HGB 10.1 (L) 09/13/2021  ? HCT 30.0 (L) 09/13/2021  ? MCV 91 09/13/2021  ? MCH 30.6 09/13/2021  ? RDW 14.1 09/13/2021  ? PLT 239 09/13/2021  ? ?Last metabolic panel ?Lab Results  ?Component Value Date  ? GLUCOSE 121 (H) 09/13/2021  ? NA 137 09/13/2021  ? K 4.8 09/13/2021  ? CL 101 09/13/2021  ? CO2 21 09/13/2021  ? BUN 18 09/13/2021  ? CREATININE 0.83 09/13/2021  ? EGFR 65 09/13/2021  ? CALCIUM 9.2 09/13/2021  ? PROT 7.2 05/19/2021  ? ALBUMIN 3.8 05/19/2021  ? LABGLOB 2.4 06/22/2015  ? AGRATIO 1.6 06/22/2015  ? BILITOT 0.6 05/19/2021  ? ALKPHOS 27 (L) 05/19/2021  ? AST 26 05/19/2021  ? ALT 19 05/19/2021  ? ANIONGAP 9 08/02/2021  ? ?Last lipids ?Lab Results  ?Component Value Date  ? CHOL 171 11/17/2020  ? HDL 55 11/17/2020  ? [place] 90 11/17/2020  ? TRIG 147 11/17/2020  ? CHOLHDL 3.1 11/17/2020  ? ?Last hemoglobin A1c ?Lab Results  ?Component Value Date  ? HGBA1C 6.0 (H) 07/02/2018  ? ?Last thyroid functions ?Lab Results  ?Component Value Date  ? TSH 2.05 03/03/2021  ? T4TOTAL 8.9 03/03/2021  ? ?Last vitamin D ?Lab Results  ?Component Value Date  ? VD25OH 35 09/09/2018  ? ?Last vitamin B12 and Folate ?Lab Results  ?Component Value Date  ? [id number] 906 02/23/2021  ? FOLATE 56.0 02/23/2021  ? ?  ? ?The ASCVD Risk score (Arnett DK, et al., 2019) failed to calculate for the following reasons: ?  The 2019 ASCVD risk score is only valid for ages 58 to 70 ? ?  ?Assessment & Plan:  ? ?Problem List Items Addressed This Visit   ? ?  ? Cardiovascular and Mediastinum  ?  Essential hypertension  ?  Blood pressure at goal today.  Continue lisinopril 40 mg, spironolactone 25 mg.  She was started on midodrine 2.25 mg twice daily prior to pacemaker placement when she was hypotensive.  She is seeing cardiology in July at which point this medication may be discontinued.  She will continue to monitor blood pressure at home. ? ?  ?  ? Paroxysmal atrial fibrillation (HCC)  ?  Stable rate controlled.  She is on Xarelto 15  mg daily.  We did discuss how being on the blood thinner increases her risk of adverse event from falls and need to focus on fall prevention.  Again this is managed by cardiology and she sees them in July. ? ?  ?  ?  ? Endocrine  ? Hypothyroidism  ?  Stable.  Denies hypo or hyper thyroid symptoms today.  Continue current regimen of levothyroxine 88 mcg on Tuesdays, Thursdays and Saturdays and 75 mcg on Mondays, Wednesdays, Fridays and Sundays.  Last thyroid panel normal in September 2022, plan to recheck it this fall. ? ?  ?  ? SIADH (syndrome of inappropriate ADH production) ([place])  ?  Managed by nephrology at [facility], note reviewed from 06/20/2021.  She is following with nephrology again tomorrow.  She is currently on fluid restriction but her last BMP showed a normal sodium of 137 in March. ? ?  ?  ? ?Other Visit Diagnoses   ? ? Ambulatory dysfunction    -  Primary.  Referral placed to physical therapy for ambulatory dysfunction and age-related weakness.  She does ambulate with a cane or walker at all times.  She is at an increased fall risk and is on anticoagulation.  We discussed fall prevention strategies to utilize at home.  Follow-up in 3 months for recheck of chronic medical conditions.  ? Relevant Orders  ? Ambulatory referral to Physical Therapy  ? ?  ? ? ?Return in about 3 months (around 02/02/2022).  ? ? ?Teodora Medici, DO ? ?

## 2021-11-02 NOTE — Assessment & Plan Note (Signed)
Managed by nephrology at [facility], note reviewed from 06/20/2021.  She is following with nephrology again tomorrow.  She is currently on fluid restriction but her last BMP showed a normal sodium of 137 in March. ?

## 2021-11-02 NOTE — Assessment & Plan Note (Signed)
Blood pressure at goal today.  Continue lisinopril 40 mg, spironolactone 25 mg.  She was started on midodrine 2.25 mg twice daily prior to pacemaker placement when she was hypotensive.  She is seeing cardiology in July at which point this medication may be discontinued.  She will continue to monitor blood pressure at home. ?

## 2021-11-02 NOTE — Assessment & Plan Note (Signed)
Stable.  Denies hypo or hyper thyroid symptoms today.  Continue current regimen of levothyroxine 88 mcg on Tuesdays, Thursdays and Saturdays and 75 mcg on Mondays, Wednesdays, Fridays and Sundays.  Last thyroid panel normal in September 2022, plan to recheck it this fall. ?

## 2021-11-03 DIAGNOSIS — E871 Hypo-osmolality and hyponatremia: Secondary | ICD-10-CM | POA: Diagnosis not present

## 2021-11-03 DIAGNOSIS — C9190 Lymphoid leukemia, unspecified not having achieved remission: Secondary | ICD-10-CM | POA: Diagnosis not present

## 2021-11-03 DIAGNOSIS — I10 Essential (primary) hypertension: Secondary | ICD-10-CM | POA: Diagnosis not present

## 2021-11-15 DIAGNOSIS — H04129 Dry eye syndrome of unspecified lacrimal gland: Secondary | ICD-10-CM | POA: Diagnosis not present

## 2021-11-15 DIAGNOSIS — H401132 Primary open-angle glaucoma, bilateral, moderate stage: Secondary | ICD-10-CM | POA: Diagnosis not present

## 2021-11-16 ENCOUNTER — Encounter: Payer: Self-pay | Admitting: Oncology

## 2021-11-16 ENCOUNTER — Inpatient Hospital Stay (HOSPITAL_BASED_OUTPATIENT_CLINIC_OR_DEPARTMENT_OTHER): Payer: Medicare Other | Admitting: Oncology

## 2021-11-16 ENCOUNTER — Inpatient Hospital Stay: Payer: Medicare Other | Attending: Oncology

## 2021-11-16 VITALS — BP 180/63 | HR 76 | Temp 98.3°F | Resp 14 | Wt 137.2 lb

## 2021-11-16 DIAGNOSIS — C9110 Chronic lymphocytic leukemia of B-cell type not having achieved remission: Secondary | ICD-10-CM | POA: Diagnosis not present

## 2021-11-16 DIAGNOSIS — E871 Hypo-osmolality and hyponatremia: Secondary | ICD-10-CM | POA: Diagnosis not present

## 2021-11-16 DIAGNOSIS — R918 Other nonspecific abnormal finding of lung field: Secondary | ICD-10-CM | POA: Diagnosis not present

## 2021-11-16 DIAGNOSIS — E538 Deficiency of other specified B group vitamins: Secondary | ICD-10-CM

## 2021-11-16 DIAGNOSIS — D649 Anemia, unspecified: Secondary | ICD-10-CM

## 2021-11-16 LAB — CBC WITH DIFFERENTIAL/PLATELET
Abs Immature Granulocytes: 0.02 10*3/uL (ref 0.00–0.07)
Basophils Absolute: 0 10*3/uL (ref 0.0–0.1)
Basophils Relative: 0 %
Eosinophils Absolute: 0.1 10*3/uL (ref 0.0–0.5)
Eosinophils Relative: 2 %
HCT: 34.5 % — ABNORMAL LOW (ref 36.0–46.0)
Hemoglobin: 11.4 g/dL — ABNORMAL LOW (ref 12.0–15.0)
Immature Granulocytes: 0 %
Lymphocytes Relative: 48 %
Lymphs Abs: 3.5 10*3/uL (ref 0.7–4.0)
MCH: 31.4 pg (ref 26.0–34.0)
MCHC: 33 g/dL (ref 30.0–36.0)
MCV: 95 fL (ref 80.0–100.0)
Monocytes Absolute: 0.6 10*3/uL (ref 0.1–1.0)
Monocytes Relative: 8 %
Neutro Abs: 3.1 10*3/uL (ref 1.7–7.7)
Neutrophils Relative %: 42 %
Platelets: 196 10*3/uL (ref 150–400)
RBC: 3.63 MIL/uL — ABNORMAL LOW (ref 3.87–5.11)
RDW: 13.6 % (ref 11.5–15.5)
WBC: 7.2 10*3/uL (ref 4.0–10.5)
nRBC: 0 % (ref 0.0–0.2)

## 2021-11-16 LAB — FERRITIN: Ferritin: 62 ng/mL (ref 11–307)

## 2021-11-16 LAB — VITAMIN B12: Vitamin B-12: 452 pg/mL (ref 180–914)

## 2021-11-16 LAB — IRON AND TIBC
Iron: 103 ug/dL (ref 28–170)
Saturation Ratios: 31 % (ref 10.4–31.8)
TIBC: 333 ug/dL (ref 250–450)
UIBC: 230 ug/dL

## 2021-11-16 LAB — FOLATE: Folate: 22 ng/mL (ref >5.9)

## 2021-11-16 NOTE — Progress Notes (Signed)
Hematology/Oncology Consult note [facility]  [phone] [phone]  Patient Care Team: Teodora Medici, DO as PCP - General (Internal Medicine) End, Harrell Gave, MD as PCP - Cardiology (Cardiology) Anell Barr, OD (Optometry) End, Harrell Gave, MD as Consulting Physician (Cardiology) Anthonette Legato, MD (Nephrology) Sindy Guadeloupe, MD as Consulting Physician (Oncology)   Name of the patient: Sydney Mcdaniel  [id number]  September 09, 1924   Date of visit: 11/16/21  Diagnosis- Rai stage 0 CLL  Chief complaint/ Reason for visit- routine f/u of CLL  Heme/Onc history: Patient is a 88 year old female who was diagnosed with Rai stage 0 CLL in in 2016. Peripheral blood flow cytometry revealed involvement by a CD5+, CD23+, CD38- monoclonal B cell population with lambda light chain restriction consistent with CLL/SLL.  There was a small population (8% of the T cells) of double positive (CD4+/CD8+) T cells (described in association with chronic viral infections, autoimmune disorders, chronic inflammatory disorders, and immunodeficiency states).  She has not required any treatment for leukemia so far   Patient has had chronic normocytic anemia with a hemoglobin that fluctuates between 10-11 at least dating back to 2017.  She has a history of B12 deficiency and is on monthly B12 injections.  Iron studies have been normal in the past.  Reticulocyte count was normal indicative of hypoproliferative anemia.  Coombs test has been positive in the past but patient has had a normal to low reticulocyte count, normal LDH and no elevated bilirubin   Patient also has chronic hyponatremia and stable pulmonary nodules which do not require further follow-up    Interval history- she is at her stable baseline. No recent hospitalizations. Appetite and weight have remained stable.   ECOG PS- 2  Review of systems- Review of Systems  Constitutional:  Negative for  chills, fever, malaise/fatigue and weight loss.  HENT:  Negative for congestion, ear discharge and nosebleeds.   Eyes:  Negative for blurred vision.  Respiratory:  Negative for cough, hemoptysis, sputum production, shortness of breath and wheezing.   Cardiovascular:  Negative for chest pain, palpitations, orthopnea and claudication.  Gastrointestinal:  Negative for abdominal pain, blood in stool, constipation, diarrhea, heartburn, melena, nausea and vomiting.  Genitourinary:  Negative for dysuria, flank pain, frequency, hematuria and urgency.  Musculoskeletal:  Negative for back pain, joint pain and myalgias.  Skin:  Negative for rash.  Neurological:  Negative for dizziness, tingling, focal weakness, seizures, weakness and headaches.  Endo/Heme/Allergies:  Does not bruise/bleed easily.  Psychiatric/Behavioral:  Negative for depression and suicidal ideas. The patient does not have insomnia.      Allergies  Allergen Reactions   Alphagan [Brimonidine] Itching and Rash   Pravachol [Pravastatin Sodium] Itching and Rash     Past Medical History:  Diagnosis Date   Allergy    Anemia    Cardiomyopathy ([place])    a. 02/2019 Echo: EF 60-65%, no rwma, doppler parameters consistent w/ pseudonormalization. Nl RV size/fxn; b. 06/2021 Echo: EF 40-45% w/ sev basal-mid antsept HK. GrII DD. Nl RV size/fxn. Mild MR/AI.   Carotid arterial disease ([place])    a. 04/2020 Carotid U/S: 1-39% bilat ICA stenosis, <50% bilat ECA stenosis; b. 06/2021 Carotid U/S: RICA 9-51%, LICA 88-41%.   Cataract    CLL (chronic lymphocytic leukemia) (HCC)    GERD (gastroesophageal reflux disease)    Glaucoma    H/O: hysterectomy    Total   History of stress test    a. 02/2019 MV: EF 60%, no ischemia/infarct.  Hyperlipidemia    Hypertension    Hypothyroidism    Impaired fasting glucose    Lichen sclerosus    Osteoporosis    Hips   PAF (paroxysmal atrial fibrillation) ([place])    a. 06/15/2016 Event monitor: 4% afib burden; b.  CHA2DS2VASc - 4-->Xarelto.   Sinus bradycardia    a. avoid AVN blocking agents.   Syncope    a. 03/2016 Echo: EF 55-60%, no rwma, mild AI/MR, nl PASP; b. 03/2016 48h Holter: no significant arrhythmias/pauses; c. 03/2016 MV: mild apical defect, likely breast attenuation, nl EF, low risk; d. 05/2016 Event monitor: No significant arrhythmia; e. 05/2016 Event monitor: PAF (4%).     Past Surgical History:  Procedure Laterality Date   APPENDECTOMY     EYE SURGERY     Glaucoma   MULTIPLE TOOTH EXTRACTIONS N/A    2 teeth   PACEMAKER IMPLANT N/A 09/22/2021   Procedure: PACEMAKER IMPLANT;  Surgeon: Deboraha Sprang, MD;  Location: [facility];  Service: Cardiovascular;  Laterality: N/A;   TOTAL ABDOMINAL HYSTERECTOMY      Social History   Socioeconomic History   Marital status: Widowed    Spouse name: Not on file   Number of children: 2   Years of education: Not on file   Highest education level: High school graduate  Occupational History   Occupation: retired  Tobacco Use   Smoking status: Never   Smokeless tobacco: Never  Vaping Use   Vaping Use: Never used  Substance and Sexual Activity   Alcohol use: No   Drug use: No   Sexual activity: Not Currently  Other Topics Concern   Not on file  Social History Narrative   Pt lives alone. Daughter [name] close by   Social Determinants of Radio broadcast assistant Strain: Low Risk    Difficulty of Paying Living Expenses: Not hard at all  Food Insecurity: No Food Insecurity   Worried About Charity fundraiser in the Last Year: Never true   Arboriculturist in the Last Year: Never true  Transportation Needs: No Transportation Needs   Lack of Transportation (Medical): No   Lack of Transportation (Non-Medical): No  Physical Activity: Inactive   Days of Exercise per Week: 0 days   Minutes of Exercise per Session: 0 min  Stress: No Stress Concern Present   Feeling of Stress : Not at all  Social Connections: Moderately  Isolated   Frequency of Communication with Friends and Family: More than three times a week   Frequency of Social Gatherings with Friends and Family: More than three times a week   Attends Religious Services: More than 4 times per year   Active Member of [employer] or Organizations: No   Attends Archivist Meetings: Never   Marital Status: Widowed  Human resources officer Violence: Not At Risk   Fear of Current or Ex-Partner: No   Emotionally Abused: No   Physically Abused: No   Sexually Abused: No    Family History  Problem Relation Age of Onset   Heart attack Mother    Glaucoma Mother    Heart attack Father    Parkinson's disease Brother    Heart attack Brother      Current Outpatient Medications:    acetaminophen (TYLENOL) 500 MG tablet, Take 500 mg by mouth every 8 (eight) hours as needed for moderate pain., Disp: , Rfl:    alendronate (FOSAMAX) 70 MG tablet, Take 1 tablet (70 mg total)  by mouth every Friday. TAKE 1 TABLET BY MOUTH ONCE A WEEK WITH A FULL GLASS OF WATER ON AN EMPTY STOMACH, Disp: 12 tablet, Rfl: 0   atorvastatin (LIPITOR) 20 MG tablet, Take 20 mg by mouth every evening., Disp: , Rfl:    Cholecalciferol 25 MCG (1000 UT) tablet, Take by mouth., Disp: , Rfl:    ferrous sulfate 325 (65 FE) MG tablet, Take 1 tablet (325 mg total) by mouth daily with breakfast. (Patient taking differently: Take 650 mg by mouth daily with breakfast.), Disp: , Rfl: 3   latanoprost (XALATAN) 0.005 % ophthalmic solution, Place 1 drop into the right eye at bedtime. , Disp: , Rfl:    levothyroxine (EUTHYROX) 75 MCG tablet, TAKE ONE TABLET BY MOUTH ON MONDAYS, WEDNESDAYS, FRIDAYS, AND SUNDAYS, Disp: 52 tablet, Rfl: 2   levothyroxine (EUTHYROX) 88 MCG tablet, TAKE 1 TABLET BY MOUTH ON TUESDAYS, THURSDAYS, AND SATURDAYS, Disp: 39 tablet, Rfl: 2   lisinopril (ZESTRIL) 40 MG tablet, Take 1 tablet (40 mg total) by mouth daily., Disp: 90 tablet, Rfl: 1   midodrine (PROAMATINE) 2.5 MG tablet, Take 1  tablet (2.5 mg) by mouth when waking up in the morning & take 1 tablet (2.5 mg) by mouth after your nap, Disp: 60 tablet, Rfl: 6   Netarsudil Dimesylate (RHOPRESSA) 0.02 % SOLN, Place 1 drop into the right eye at bedtime., Disp: , Rfl:    polyethylene glycol (MIRALAX / GLYCOLAX) 17 g packet, Take 17 g by mouth daily., Disp: , Rfl:    Rivaroxaban (XARELTO) 15 MG TABS tablet, Take 1 tablet (15 mg total) by mouth daily with supper., Disp: 90 tablet, Rfl: 2   senna (SENOKOT) 8.6 MG tablet, Take 1 tablet by mouth daily as needed for constipation. Uses only when miralax does not work., Disp: , Rfl:    spironolactone (ALDACTONE) 25 MG tablet, Take 25 mg by mouth daily., Disp: , Rfl:    timolol (TIMOPTIC) 0.5 % ophthalmic solution, Place 1 drop into the right eye 2 (two) times daily. Instill 1 drop in the morning and 1 drop at 1700, Disp: , Rfl:    vitamin B-12 (CYANOCOBALAMIN) 100 MCG tablet, Take 100 mcg by mouth daily., Disp: , Rfl:   Physical exam:  Vitals:   11/16/21 1407  BP: (!) 180/63  Pulse: 76  Resp: 14  Temp: 98.3 F (36.8 C)  SpO2: 97%  Weight: 137 lb 3.2 oz (62.2 kg)   Physical Exam Cardiovascular:     Rate and Rhythm: Normal rate and regular rhythm.     Heart sounds: Normal heart sounds.  Pulmonary:     Effort: Pulmonary effort is normal.     Breath sounds: Normal breath sounds.  Abdominal:     General: Bowel sounds are normal.     Palpations: Abdomen is soft.  Lymphadenopathy:     Comments: No palpable cervical, supraclavicular, axillary or inguinal adenopathy    Skin:    General: Skin is warm and dry.  Neurological:     Mental Status: She is alert and oriented to person, place, and time.        Latest Ref Rng & Units 09/13/2021    8:39 AM  CMP  Glucose 70 - 99 mg/dL 121    BUN 10 - 36 mg/dL 18    Creatinine 0.57 - 1.00 mg/dL 0.83    Sodium 134 - 144 mmol/L 137    Potassium 3.5 - 5.2 mmol/L 4.8    Chloride 96 - 106 mmol/L  101    CO2 20 - 29 mmol/L 21     Calcium 8.7 - 10.3 mg/dL 9.2        Latest Ref Rng & Units 11/16/2021    1:39 PM  CBC  WBC 4.0 - 10.5 K/uL 7.2    Hemoglobin 12.0 - 15.0 g/dL 11.4    Hematocrit 36.0 - 46.0 % 34.5    Platelets 150 - 400 K/uL 196       Assessment and plan- Patient is a 88 y.o. female with rai stage 0 CLL here for routine f/u  Cbc is normal. No lymphocytosis. Hb stab;e around 11. No thrombocytopenia. Given her age and stability of counts, I think she can f/u with her pcp. She does not require f/u with me unless questions or concerns arise.    Visit Diagnosis 1. CLL (chronic lymphocytic leukemia) ([place])      Dr. Randa Evens, MD, MPH [facility] [phone] 11/16/2021 4:13 PM

## 2021-12-18 IMAGING — CR DG LUMBAR SPINE 2-3V
3 series · 3 of 3 positions shown · non-contrast
Comparison: 06/26/2011

CLINICAL DATA: Recent fall with low back pain, initial encounter

EXAM:
LUMBAR SPINE - 3 VIEW

[l-spine ap]
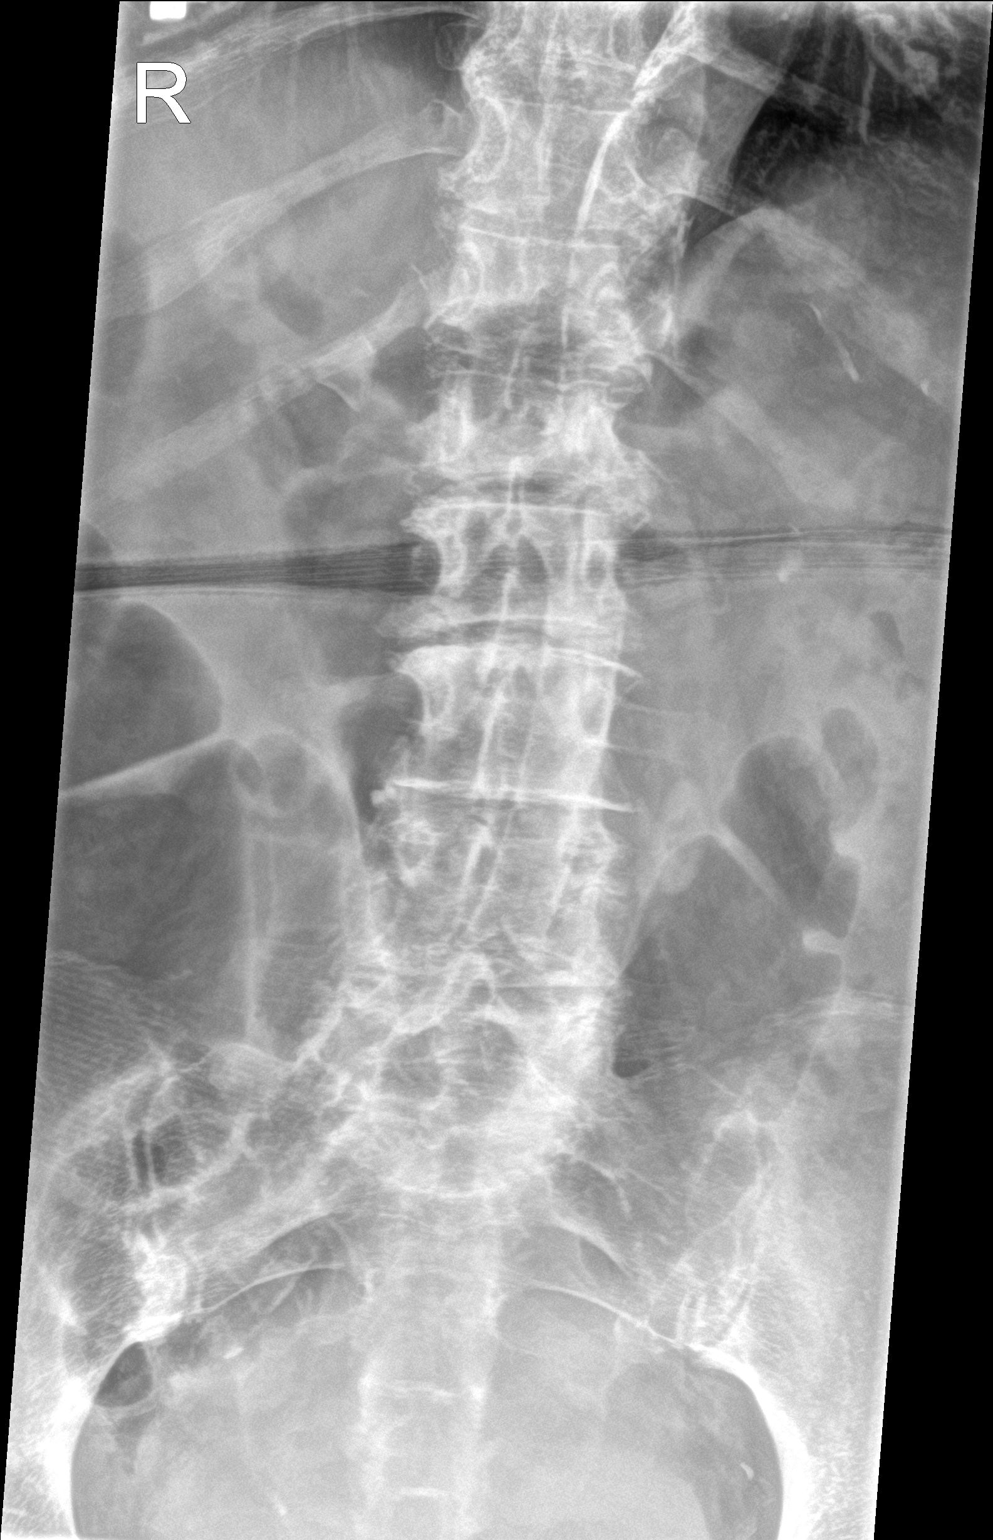

[l-spine lat]
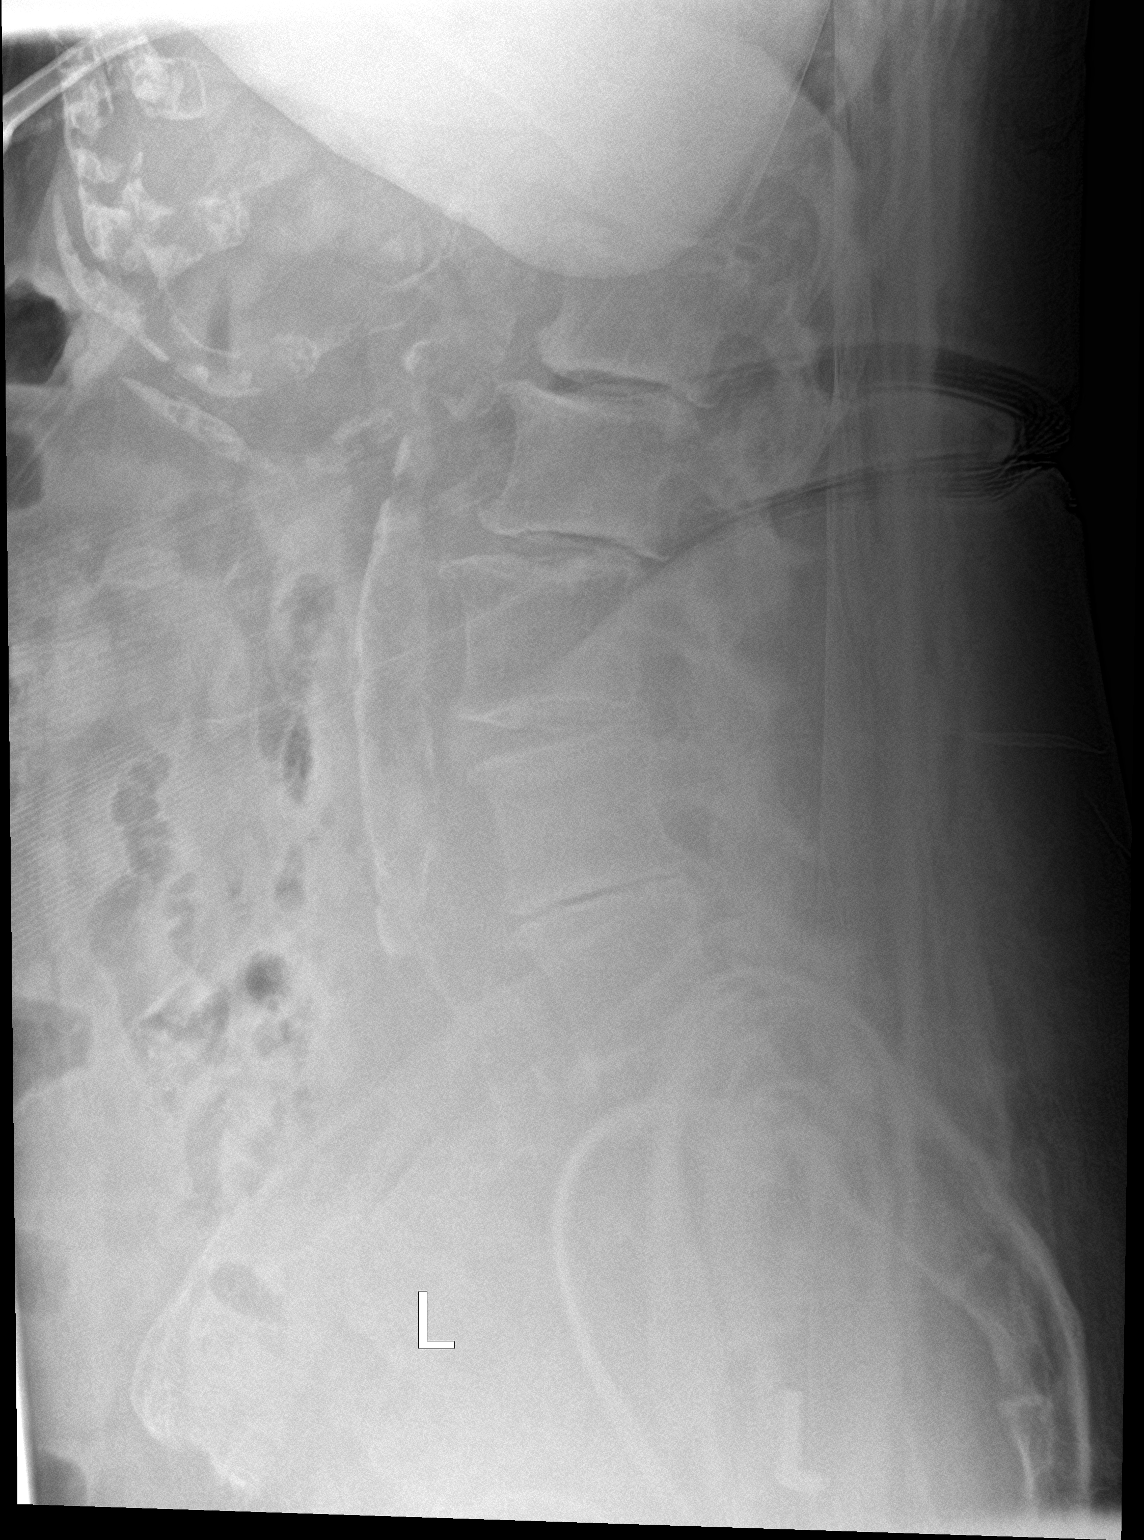

[l-spine spot]
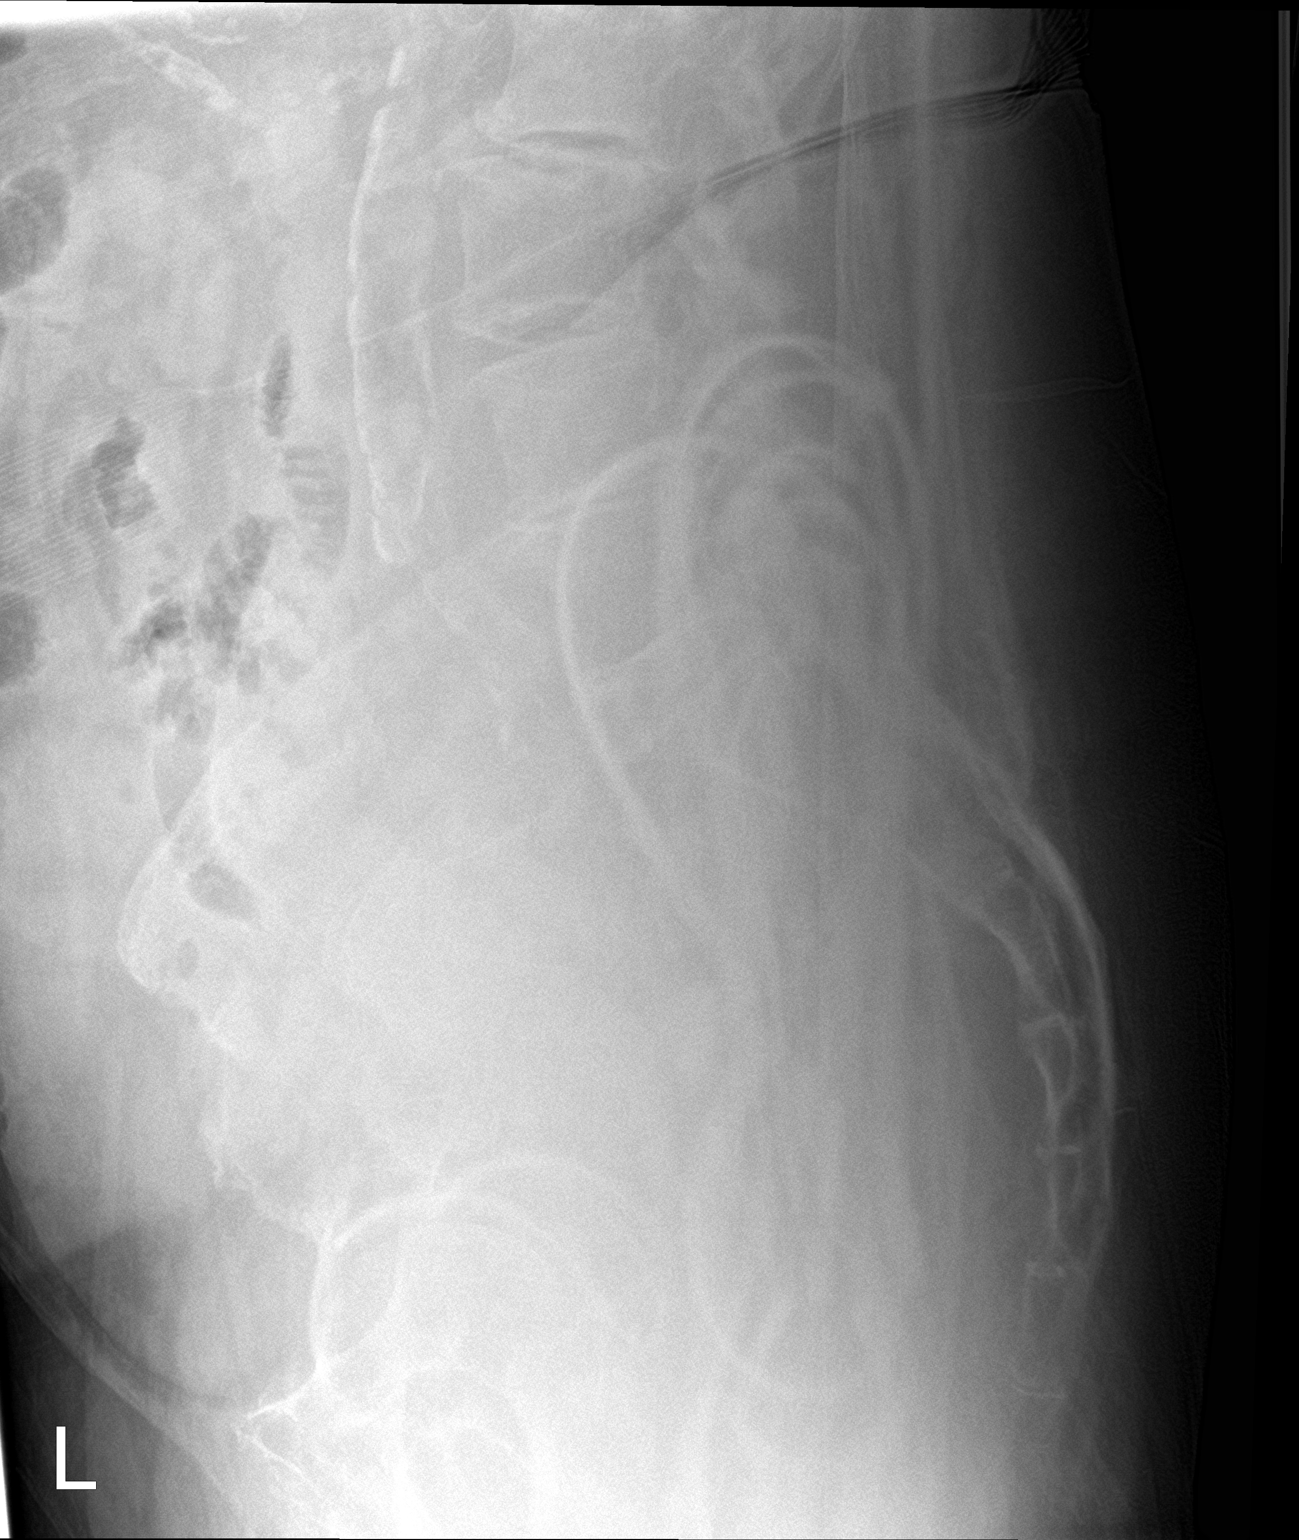

[3 of 3 positions shown; findings below may reference images not displayed]

FINDINGS: Five lumbar type vertebral bodies are well visualized. Vertebral
body height is well maintained. Multilevel disc space narrowing is
noted. Mild retrolisthesis of L2 on L3 is noted. Aortic
calcifications are noted. Multilevel osteophytic changes are seen.
Calcification is noted to the left of the midline which may
represent renal calculus. This measures approximately 8 mm.
Triangular calcifications are noted in the right upper quadrant
consistent with cholelithiasis.
IMPRESSION: Degenerative change of the lumbar spine as described.

Calcification to the left of the midline which may represent a renal
calculus.

Cholelithiasis is noted in the right upper quadrant.

## 2021-12-18 IMAGING — CT CT CERVICAL SPINE W/O CM
3 of 4 series · 13 of 33 positions shown, 16 images · non-contrast
Comparison: None.

CLINICAL DATA: Unwitnessed fall at home

EXAM:
CT HEAD WITHOUT CONTRAST
CT CERVICAL SPINE WITHOUT CONTRAST
TECHNIQUE: Multidetector CT imaging of the head and cervical spine was
performed following the standard protocol without intravenous
contrast. Multiplanar CT image reconstructions of the cervical spine
were also generated.

[Series 5: sagittal bone · sagittal · 0.30mm/px · 5 of 77 slices shown, 6 images]
[im 26/77  bone]
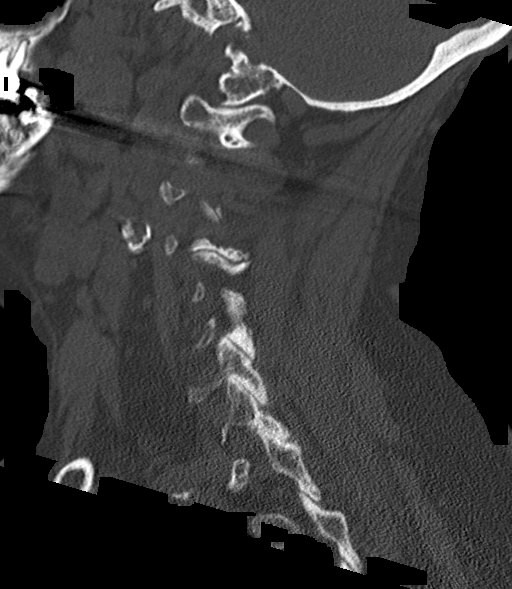
[im 32/77  bone]
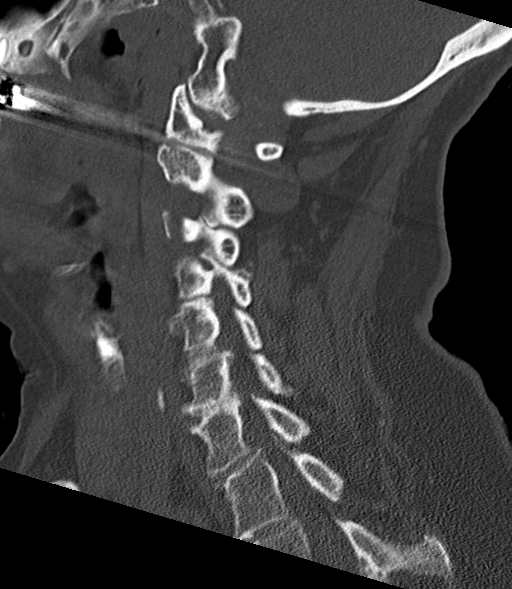
[im 39/77  soft-tissue]
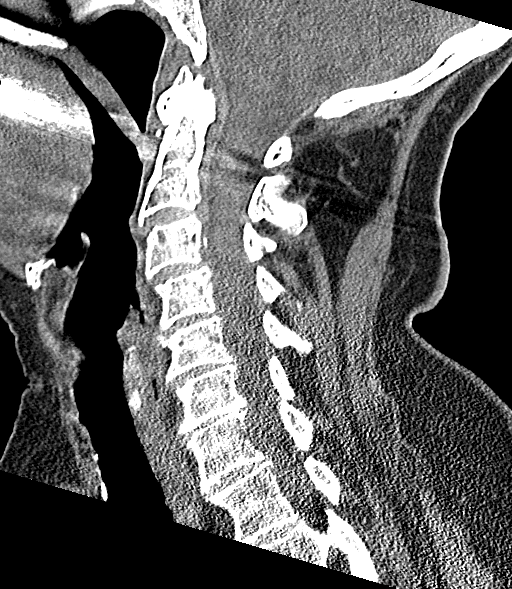
[im 39/77  bone]
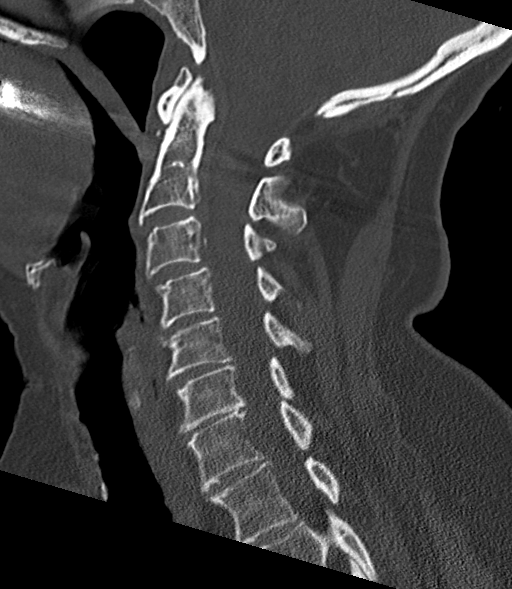
[im 45/77  bone]
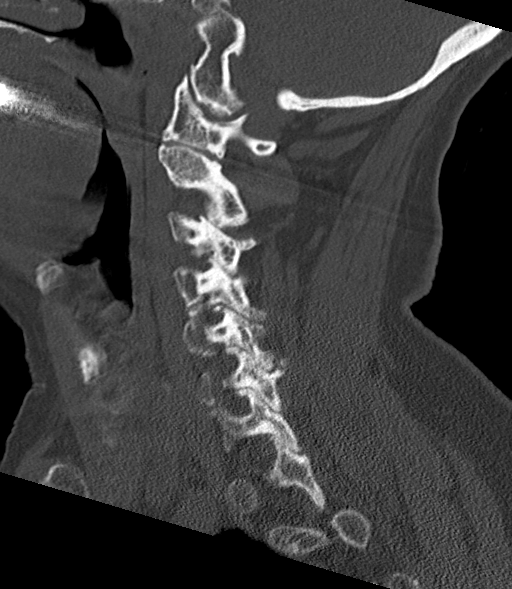
[im 51/77  bone]
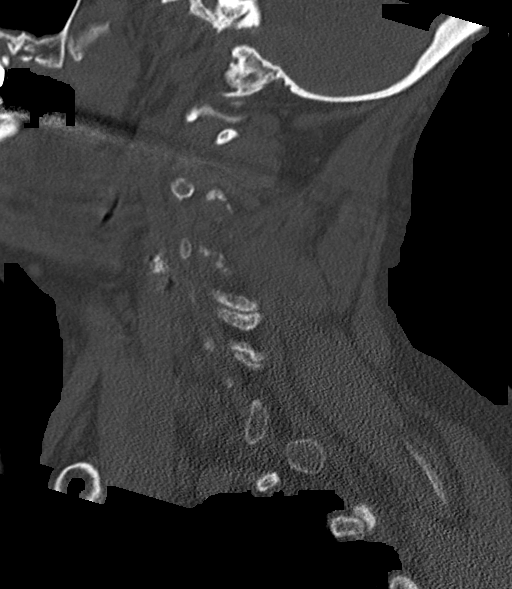

[Series 6: coronal bone · coronal · 0.34mm/px · 3 of 71 slices shown]
[im 15/71  bone]
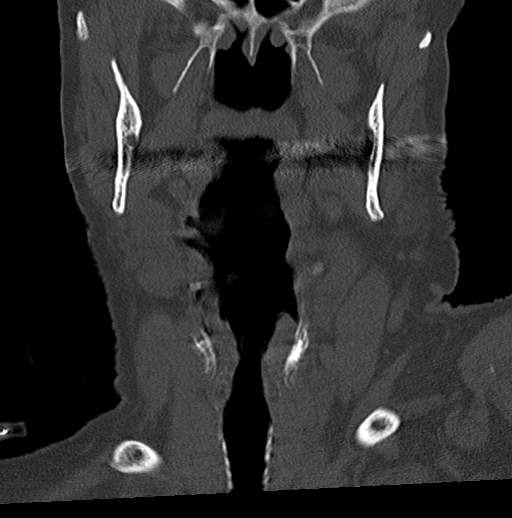
[im 29/71  bone]
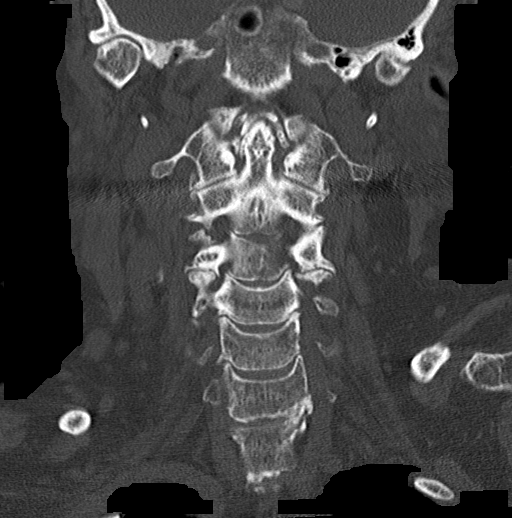
[im 43/71  bone]
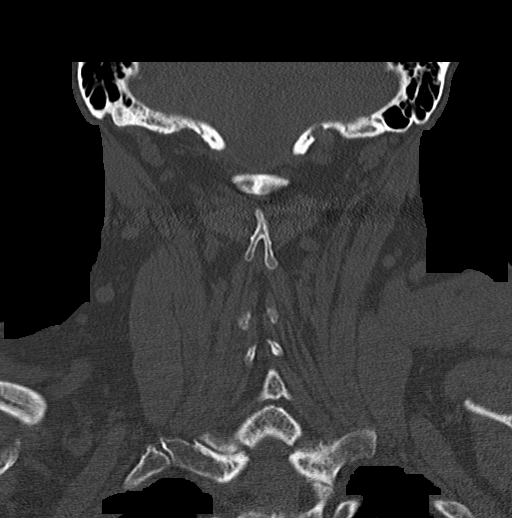

[Series 7: orthogonal bone · axial · 0.24mm/px · z∈[-315,-203]mm · 5 of 89 slices shown, 7 images]
[im 15/89  soft-tissue]
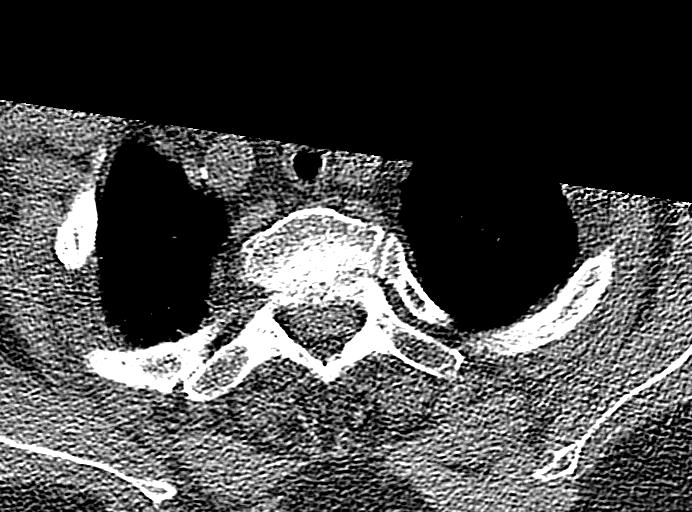
[im 15/89  bone]
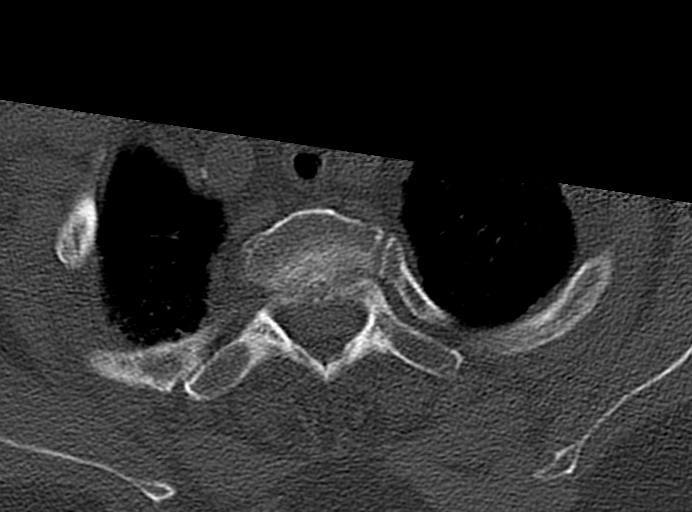
[im 30/89  bone]
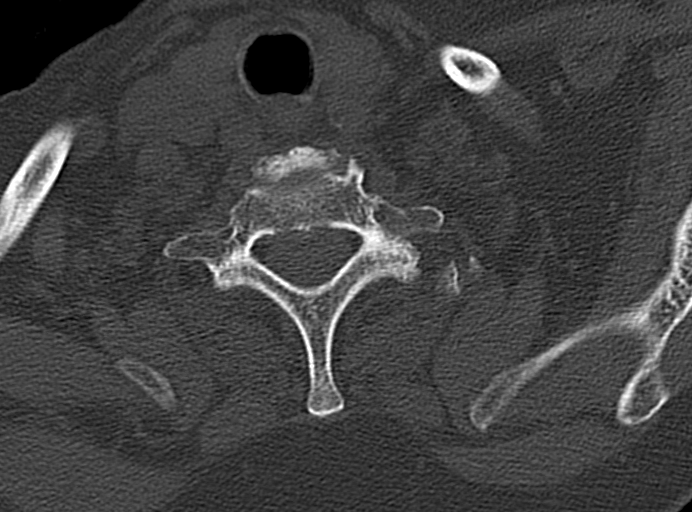
[im 45/89  bone]
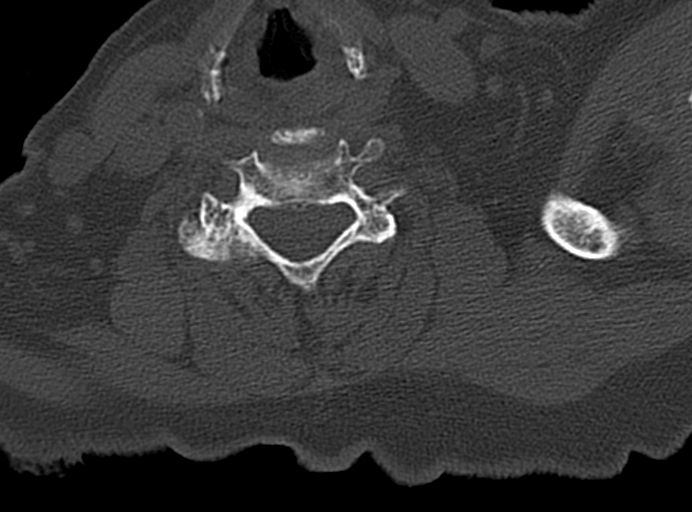
[im 59/89  bone]
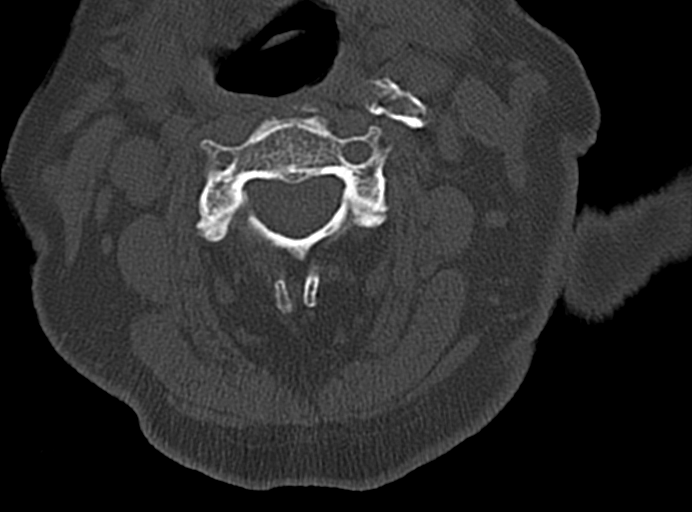
[im 74/89  soft-tissue]
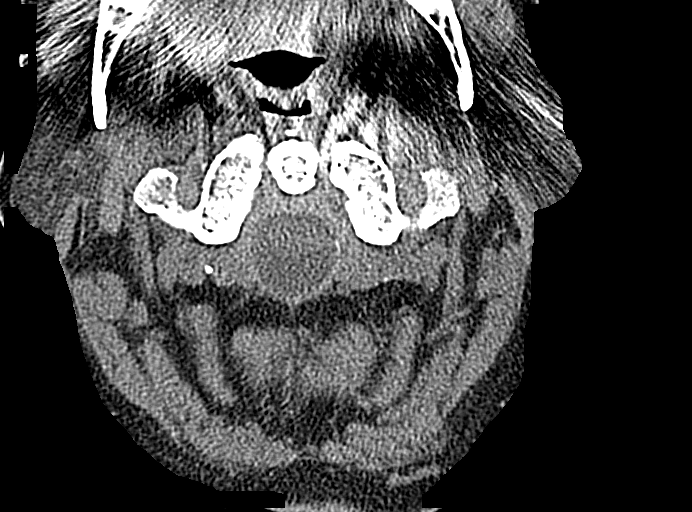
[im 74/89  bone]
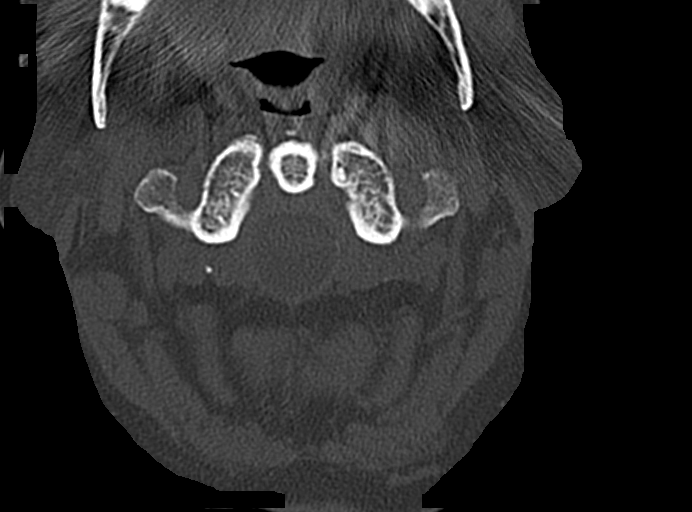

[13 of 33 positions shown; findings below may reference images not displayed]

FINDINGS: CT HEAD FINDINGS

Brain: No evidence of acute infarction, hemorrhage, hydrocephalus,
extra-axial collection or mass lesion/mass effect. Brain atrophy.

Vascular: No hyperdense vessel or unexpected calcification.

Skull: Normal. Negative for fracture or focal lesion.

Sinuses/Orbits: No visible injury

CT CERVICAL SPINE FINDINGS

Alignment: No traumatic malalignment

Skull base and vertebrae: No acute fracture

Soft tissues and spinal canal: No prevertebral fluid or swelling. No
visible canal hematoma.

Disc levels:  Generalized degenerative endplate and facet spurring.

Upper chest: No visible injury
IMPRESSION: No evidence of acute intracranial or cervical spine injury.

## 2021-12-19 ENCOUNTER — Other Ambulatory Visit: Payer: Self-pay | Admitting: Internal Medicine

## 2021-12-19 DIAGNOSIS — M810 Age-related osteoporosis without current pathological fracture: Secondary | ICD-10-CM

## 2021-12-23 ENCOUNTER — Ambulatory Visit (INDEPENDENT_AMBULATORY_CARE_PROVIDER_SITE_OTHER): Payer: Medicare Other

## 2021-12-23 ENCOUNTER — Other Ambulatory Visit: Payer: Self-pay | Admitting: Internal Medicine

## 2021-12-23 DIAGNOSIS — I440 Atrioventricular block, first degree: Secondary | ICD-10-CM

## 2021-12-23 LAB — CUP PACEART REMOTE DEVICE CHECK
Date Time Interrogation Session: 20230628075349
Implantable Lead Implant Date: 20230330
Implantable Lead Implant Date: 20230330
Implantable Lead Location: 753859
Implantable Lead Location: 753860
Implantable Lead Model: 377
Implantable Lead Model: 399
Implantable Lead Serial Number: 8000191707
Implantable Lead Serial Number: 8000584009
Implantable Pulse Generator Implant Date: 20230330
Pulse Gen Model: 407145
Pulse Gen Serial Number: 70372674

## 2021-12-29 ENCOUNTER — Encounter: Payer: Self-pay | Admitting: Internal Medicine

## 2021-12-29 ENCOUNTER — Ambulatory Visit (INDEPENDENT_AMBULATORY_CARE_PROVIDER_SITE_OTHER): Payer: Medicare Other | Admitting: Internal Medicine

## 2021-12-29 ENCOUNTER — Other Ambulatory Visit: Payer: Self-pay | Admitting: Internal Medicine

## 2021-12-29 VITALS — BP 161/79 | HR 68 | Ht 63.0 in | Wt 136.4 lb

## 2021-12-29 DIAGNOSIS — R001 Bradycardia, unspecified: Secondary | ICD-10-CM

## 2021-12-29 DIAGNOSIS — I429 Cardiomyopathy, unspecified: Secondary | ICD-10-CM

## 2021-12-29 DIAGNOSIS — R55 Syncope and collapse: Secondary | ICD-10-CM

## 2021-12-29 DIAGNOSIS — I6523 Occlusion and stenosis of bilateral carotid arteries: Secondary | ICD-10-CM

## 2021-12-29 DIAGNOSIS — E782 Mixed hyperlipidemia: Secondary | ICD-10-CM

## 2021-12-29 MED ORDER — ATORVASTATIN CALCIUM 20 MG PO TABS: 20.0000 mg | ORAL_TABLET | Freq: Every evening | ORAL | 1 refills | Status: DC

## 2021-12-29 NOTE — Patient Instructions (Signed)
Medication Instructions:  Your physician recommends that you continue on your current medications as directed. Please refer to the Current Medication list given to you today.  *If you need a refill on your cardiac medications before your next appointment, please call your pharmacy*   Lab Work: None ordered.  If you have labs (blood work) drawn today and your tests are completely normal, you will receive your results only by: [place] (if you have MyChart) OR A paper copy in the mail If you have any lab test that is abnormal or we need to change your treatment, we will call you to review the results.   Testing/Procedures: None ordered.    Follow-Up: At [facility], you and your health needs are our priority.  As part of our continuing mission to provide you with exceptional heart care, we have created designated Provider Care Teams.  These Care Teams include your primary Cardiologist (physician) and Advanced Practice Providers (APPs -  Physician Assistants and Nurse Practitioners) who all work together to provide you with the care you need, when you need it.  We recommend signing up for the patient portal called "MyChart".  Sign up information is provided on this After Visit Summary.  MyChart is used to connect with patients for Virtual Visits (Telemedicine).  Patients are able to view lab/test results, encounter notes, upcoming appointments, etc.  Non-urgent messages can be sent to your provider as well.   To learn more about what you can do with MyChart, go to [email or web address].    Your next appointment:   9 months with Dr Caryl Comes  Important Information About Sugar

## 2021-12-29 NOTE — Progress Notes (Signed)
Patient Care Team: Teodora Medici, DO as PCP - General (Internal Medicine) End, Harrell Gave, MD as PCP - Cardiology (Cardiology) Anell Barr, OD (Optometry) End, Harrell Gave, MD as Consulting Physician (Cardiology) Anthonette Legato, MD (Nephrology) Sindy Guadeloupe, MD as Consulting Physician (Oncology)   HPI  Sydney Mcdaniel is a 88 y.o. female seen in follow-up for syncope thought to be neurally mediated underwent pacemaker implantation-Biotronik 3/23 and atrial fibrillation paroxysmal.  On Rivaroxaban  Also hypertension and orthostatic hypotension for which she takes midodrine and abdominal binder which she thinks has not be so helpful  She is much improved following pacing with far fewer weak spells.  There have been 2 interval weak spells, both of which correlated with atrial fibrillation    DATE TEST EF    9/20 Echo  60-65%     9/20 Myoview   No ischemia   1/23 Echo   40-45 % Basilar anteroseptal severe HK             Date Cr K Hgb  2/23 1.01 4.4 11.2 <<8.9     Records and Results Reviewed   Past Medical History:  Diagnosis Date   Allergy    Anemia    Cardiomyopathy ([place])    a. 02/2019 Echo: EF 60-65%, no rwma, doppler parameters consistent w/ pseudonormalization. Nl RV size/fxn; b. 06/2021 Echo: EF 40-45% w/ sev basal-mid antsept HK. GrII DD. Nl RV size/fxn. Mild MR/AI.   Carotid arterial disease ([place])    a. 04/2020 Carotid U/S: 1-39% bilat ICA stenosis, <50% bilat ECA stenosis; b. 06/2021 Carotid U/S: RICA 2-50%, LICA 53-97%.   Cataract    CLL (chronic lymphocytic leukemia) (HCC)    GERD (gastroesophageal reflux disease)    Glaucoma    H/O: hysterectomy    Total   History of stress test    a. 02/2019 MV: EF 60%, no ischemia/infarct.   Hyperlipidemia    Hypertension    Hypothyroidism    Impaired fasting glucose    Lichen sclerosus    Osteoporosis    Hips   PAF (paroxysmal atrial fibrillation) ([place])    a. 06/15/2016 Event monitor: 4% afib burden;  b. CHA2DS2VASc - 4-->Xarelto.   Sinus bradycardia    a. avoid AVN blocking agents.   Syncope    a. 03/2016 Echo: EF 55-60%, no rwma, mild AI/MR, nl PASP; b. 03/2016 48h Holter: no significant arrhythmias/pauses; c. 03/2016 MV: mild apical defect, likely breast attenuation, nl EF, low risk; d. 05/2016 Event monitor: No significant arrhythmia; e. 05/2016 Event monitor: PAF (4%).    Past Surgical History:  Procedure Laterality Date   APPENDECTOMY     EYE SURGERY     Glaucoma   MULTIPLE TOOTH EXTRACTIONS N/A    2 teeth   PACEMAKER IMPLANT N/A 09/22/2021   Procedure: PACEMAKER IMPLANT;  Surgeon: Deboraha Sprang, MD;  Location: [place] [facility];  Service: Cardiovascular;  Laterality: N/A;   TOTAL ABDOMINAL HYSTERECTOMY      Current Meds  Medication Sig   acetaminophen (TYLENOL) 500 MG tablet Take 500 mg by mouth every 8 (eight) hours as needed for moderate pain.   alendronate (FOSAMAX) 70 MG tablet TAKE 1 TABLET BY MOUTH EVERY FRIDAY ON AN EMPTY STOMACH WITH A FULL GLASS OF WATER   atorvastatin (LIPITOR) 20 MG tablet Take 20 mg by mouth every evening.   Cholecalciferol 25 MCG (1000 UT) tablet Take by mouth.   ferrous sulfate 325 (65 FE) MG tablet Take 1  tablet (325 mg total) by mouth daily with breakfast. (Patient taking differently: Take 650 mg by mouth daily with breakfast.)   latanoprost (XALATAN) 0.005 % ophthalmic solution Place 1 drop into the right eye at bedtime.    levothyroxine (EUTHYROX) 75 MCG tablet TAKE ONE TABLET BY MOUTH ON MONDAYS, WEDNESDAYS, FRIDAYS, AND SUNDAYS   levothyroxine (EUTHYROX) 88 MCG tablet TAKE 1 TABLET BY MOUTH ON TUESDAYS, THURSDAYS, AND SATURDAYS   lisinopril (ZESTRIL) 40 MG tablet Take 1 tablet (40 mg total) by mouth daily.   midodrine (PROAMATINE) 2.5 MG tablet Take 1 tablet (2.5 mg) by mouth when waking up in the morning & take 1 tablet (2.5 mg) by mouth after your nap   Netarsudil Dimesylate (RHOPRESSA) 0.02 % SOLN Place 1 drop into the right eye at  bedtime.   polyethylene glycol (MIRALAX / GLYCOLAX) 17 g packet Take 17 g by mouth daily.   Rivaroxaban (XARELTO) 15 MG TABS tablet Take 1 tablet (15 mg total) by mouth daily with supper.   spironolactone (ALDACTONE) 25 MG tablet Take 25 mg by mouth daily.   timolol (TIMOPTIC) 0.5 % ophthalmic solution Place 1 drop into the right eye 2 (two) times daily. Instill 1 drop in the morning and 1 drop at 1700   vitamin B-12 (CYANOCOBALAMIN) 100 MCG tablet Take 100 mcg by mouth daily.    Allergies  Allergen Reactions   Alphagan [Brimonidine] Itching and Rash   Pravachol [Pravastatin Sodium] Itching and Rash      Review of Systems negative except from HPI and PMH  Physical Exam BP (!) 161/79   Pulse 68   Ht '5\' 3"'$  (1.6 m)   Wt 136 lb 6.4 oz (61.9 kg)   LMP  (LMP Unknown)   SpO2 100%   BMI 24.16 kg/m  Well developed and well nourished in no acute distress HENT normal E scleral and icterus clear Neck Supple Device pocket well healed; without hematoma or erythema.  There is no tethering but some SQ atrophy JVP flat; carotids brisk and full Clear to ausculation Regular rate and rhythm, no murmurs gallops or rub Soft with active bowel sounds No clubbing cyanosis  Edema Alert and oriented, grossly normal motor and sensory function Skin Warm and Dry  ECG A pacing @ 68 27/14/44  CrCl cannot be calculated (Patient's most recent lab result is older than the maximum 21 days allowed.).   Assessment and  Plan  Syncope    Hypertension/orthostatic hypotension  Atrial fib paroxysmal    Sinus bradycardia   Cardiomyopathy-new with distinct wall motion abnormalities    Pacemaker-Biotronik  No interval syncope  Some weakness with afib episodes  self limited < 24 hrs duration  so will not use AAD for suppression  on Rivaroxaban without clinical bleeding dosed for CrCl  Still with OI, her abdominal binder is on today but is quite loose and will have her tighten  Continue midodrine  2.5 bid   with upright BP today at 160 will not increase     Current medicines are reviewed at length with the patient today .  The patient does not  have concerns regarding medicines.

## 2022-01-05 NOTE — Progress Notes (Signed)
Remote pacemaker transmission.   

## 2022-01-06 ENCOUNTER — Other Ambulatory Visit: Payer: Self-pay | Admitting: Internal Medicine

## 2022-01-06 DIAGNOSIS — I10 Essential (primary) hypertension: Secondary | ICD-10-CM

## 2022-01-09 ENCOUNTER — Other Ambulatory Visit: Payer: Self-pay | Admitting: Nurse Practitioner

## 2022-01-09 DIAGNOSIS — I10 Essential (primary) hypertension: Secondary | ICD-10-CM

## 2022-01-30 ENCOUNTER — Other Ambulatory Visit: Payer: Self-pay

## 2022-01-30 ENCOUNTER — Other Ambulatory Visit
Admission: RE | Admit: 2022-01-30 | Discharge: 2022-01-30 | Disposition: A | Discharge status: Home / Self Care | Payer: Medicare Other | Source: Ambulatory Visit | Attending: *Deleted | Admitting: *Deleted

## 2022-01-30 ENCOUNTER — Emergency Department: Payer: Medicare Other

## 2022-01-30 ENCOUNTER — Ambulatory Visit: Payer: Medicare Other | Admitting: Nurse Practitioner

## 2022-01-30 ENCOUNTER — Observation Stay
Admission: EM | Admit: 2022-01-30 | Discharge: 2022-01-31 | Disposition: A | Discharge status: Home Health | Payer: Medicare Other | Attending: Internal Medicine | Admitting: Internal Medicine

## 2022-01-30 ENCOUNTER — Ambulatory Visit: Payer: Self-pay

## 2022-01-30 DIAGNOSIS — E871 Hypo-osmolality and hyponatremia: Secondary | ICD-10-CM | POA: Diagnosis not present

## 2022-01-30 DIAGNOSIS — I10 Essential (primary) hypertension: Secondary | ICD-10-CM | POA: Diagnosis not present

## 2022-01-30 DIAGNOSIS — R142 Eructation: Secondary | ICD-10-CM | POA: Diagnosis not present

## 2022-01-30 DIAGNOSIS — R0689 Other abnormalities of breathing: Secondary | ICD-10-CM | POA: Insufficient documentation

## 2022-01-30 DIAGNOSIS — R61 Generalized hyperhidrosis: Secondary | ICD-10-CM | POA: Diagnosis not present

## 2022-01-30 DIAGNOSIS — R531 Weakness: Secondary | ICD-10-CM | POA: Diagnosis not present

## 2022-01-30 DIAGNOSIS — Z79899 Other long term (current) drug therapy: Secondary | ICD-10-CM | POA: Diagnosis not present

## 2022-01-30 DIAGNOSIS — R4182 Altered mental status, unspecified: Secondary | ICD-10-CM | POA: Diagnosis not present

## 2022-01-30 DIAGNOSIS — M79601 Pain in right arm: Principal | ICD-10-CM | POA: Diagnosis present

## 2022-01-30 DIAGNOSIS — E039 Hypothyroidism, unspecified: Secondary | ICD-10-CM | POA: Diagnosis present

## 2022-01-30 DIAGNOSIS — D649 Anemia, unspecified: Secondary | ICD-10-CM | POA: Diagnosis present

## 2022-01-30 DIAGNOSIS — E785 Hyperlipidemia, unspecified: Secondary | ICD-10-CM | POA: Diagnosis present

## 2022-01-30 DIAGNOSIS — Z856 Personal history of leukemia: Secondary | ICD-10-CM | POA: Insufficient documentation

## 2022-01-30 DIAGNOSIS — Z950 Presence of cardiac pacemaker: Secondary | ICD-10-CM | POA: Insufficient documentation

## 2022-01-30 DIAGNOSIS — I480 Paroxysmal atrial fibrillation: Secondary | ICD-10-CM | POA: Diagnosis not present

## 2022-01-30 DIAGNOSIS — R9431 Abnormal electrocardiogram [ECG] [EKG]: Secondary | ICD-10-CM | POA: Diagnosis not present

## 2022-01-30 DIAGNOSIS — E222 Syndrome of inappropriate secretion of antidiuretic hormone: Secondary | ICD-10-CM | POA: Diagnosis present

## 2022-01-30 DIAGNOSIS — M25511 Pain in right shoulder: Secondary | ICD-10-CM | POA: Insufficient documentation

## 2022-01-30 DIAGNOSIS — R55 Syncope and collapse: Secondary | ICD-10-CM | POA: Diagnosis present

## 2022-01-30 DIAGNOSIS — Z7901 Long term (current) use of anticoagulants: Secondary | ICD-10-CM | POA: Diagnosis not present

## 2022-01-30 LAB — HEPATIC FUNCTION PANEL
ALT: 11 U/L (ref 0–44)
AST: 21 U/L (ref 15–41)
Albumin: 3.9 g/dL (ref 3.5–5.0)
Alkaline Phosphatase: 27 U/L — ABNORMAL LOW (ref 38–126)
Bilirubin, Direct: 0.1 mg/dL (ref 0.0–0.2)
Indirect Bilirubin: 0.8 mg/dL (ref 0.3–0.9)
Total Bilirubin: 0.9 mg/dL (ref 0.3–1.2)
Total Protein: 6.9 g/dL (ref 6.5–8.1)

## 2022-01-30 LAB — OSMOLALITY: Osmolality: 264 mOsm/kg — ABNORMAL LOW (ref 275–295)

## 2022-01-30 LAB — CBC
HCT: 34.8 % — ABNORMAL LOW (ref 36.0–46.0)
HCT: 31.1 % — ABNORMAL LOW (ref 36.0–46.0)
Hemoglobin: 11.6 g/dL — ABNORMAL LOW (ref 12.0–15.0)
Hemoglobin: 10.4 g/dL — ABNORMAL LOW (ref 12.0–15.0)
MCH: 30.4 pg (ref 26.0–34.0)
MCH: 31 pg (ref 26.0–34.0)
MCHC: 33.3 g/dL (ref 30.0–36.0)
MCHC: 33.4 g/dL (ref 30.0–36.0)
MCV: 91.1 fL (ref 80.0–100.0)
MCV: 92.6 fL (ref 80.0–100.0)
Platelets: 205 10*3/uL (ref 150–400)
Platelets: 179 10*3/uL (ref 150–400)
RBC: 3.82 MIL/uL — ABNORMAL LOW (ref 3.87–5.11)
RBC: 3.36 MIL/uL — ABNORMAL LOW (ref 3.87–5.11)
RDW: 13.2 % (ref 11.5–15.5)
RDW: 13.4 % (ref 11.5–15.5)
WBC: 8.3 10*3/uL (ref 4.0–10.5)
WBC: 8.6 10*3/uL (ref 4.0–10.5)
nRBC: 0 % (ref 0.0–0.2)
nRBC: 0 % (ref 0.0–0.2)

## 2022-01-30 LAB — BASIC METABOLIC PANEL
Anion gap: 14 (ref 5–15)
BUN: 13 mg/dL (ref 8–23)
CO2: 19 mmol/L — ABNORMAL LOW (ref 22–32)
Calcium: 9.7 mg/dL (ref 8.9–10.3)
Chloride: 94 mmol/L — ABNORMAL LOW (ref 98–111)
Creatinine, Ser: 0.89 mg/dL (ref 0.44–1.00)
GFR, Estimated: >60 mL/min (ref >60)
Glucose, Bld: 103 mg/dL — ABNORMAL HIGH (ref 70–99)
Potassium: 3.9 mmol/L (ref 3.5–5.1)
Sodium: 127 mmol/L — ABNORMAL LOW (ref 135–145)

## 2022-01-30 LAB — URINALYSIS, ROUTINE W REFLEX MICROSCOPIC
Bilirubin Urine: NEGATIVE
Glucose, UA: NEGATIVE mg/dL
Hgb urine dipstick: NEGATIVE
Ketones, ur: NEGATIVE mg/dL
Nitrite: NEGATIVE
Protein, ur: NEGATIVE mg/dL
Specific Gravity, Urine: 1.008 (ref 1.005–1.030)
pH: 8 (ref 5.0–8.0)

## 2022-01-30 LAB — CBG MONITORING, ED: Glucose-Capillary: 101 mg/dL — ABNORMAL HIGH (ref 70–99)

## 2022-01-30 LAB — TROPONIN I (HIGH SENSITIVITY)
Troponin I (High Sensitivity): 8 ng/L (ref <18)
Troponin I (High Sensitivity): 9 ng/L (ref <18)

## 2022-01-30 LAB — SODIUM, URINE, RANDOM: Sodium, Ur: 66 mmol/L

## 2022-01-30 LAB — OSMOLALITY, URINE: Osmolality, Ur: 257 mOsm/kg — ABNORMAL LOW (ref 300–900)

## 2022-01-30 MED ORDER — MORPHINE SULFATE (PF) 2 MG/ML IV SOLN
2.0000 mg | Freq: Once | INTRAVENOUS | Status: AC
  Administered 2022-01-30: 2 mg via INTRAVENOUS
  Filled 2022-01-30: qty 1

## 2022-01-30 MED ORDER — ACETAMINOPHEN 325 MG PO TABS
650.0000 mg | ORAL_TABLET | Freq: Four times a day (QID) | ORAL | Status: DC | PRN
  Administered 2022-01-31: 650 mg via ORAL
  Filled 2022-01-30: qty 2

## 2022-01-30 MED ORDER — LEVOTHYROXINE SODIUM 50 MCG PO TABS: 75.0000 ug | ORAL_TABLET | ORAL | Status: DC

## 2022-01-30 MED ORDER — HYDROCODONE-ACETAMINOPHEN 5-325 MG PO TABS
1.0000 | ORAL_TABLET | ORAL | Status: DC | PRN
  Administered 2022-01-31: 1 via ORAL
  Filled 2022-01-30: qty 1

## 2022-01-30 MED ORDER — SPIRONOLACTONE 25 MG PO TABS
25.0000 mg | ORAL_TABLET | Freq: Every day | ORAL | Status: DC
  Administered 2022-01-31: 25 mg via ORAL
  Filled 2022-01-30: qty 1

## 2022-01-30 MED ORDER — SODIUM CHLORIDE 0.9 % IV BOLUS
500.0000 mL | Freq: Once | INTRAVENOUS | Status: AC
  Administered 2022-01-30: 500 mL via INTRAVENOUS

## 2022-01-30 MED ORDER — ACETAMINOPHEN 325 MG PO TABS
650.0000 mg | ORAL_TABLET | Freq: Once | ORAL | Status: AC
  Administered 2022-01-30: 650 mg via ORAL
  Filled 2022-01-30: qty 2

## 2022-01-30 MED ORDER — SODIUM CHLORIDE 0.9 % IV SOLN
INTRAVENOUS | Status: DC
Start: 2022-01-30 — End: 2022-01-31

## 2022-01-30 MED ORDER — ATORVASTATIN CALCIUM 20 MG PO TABS
20.0000 mg | ORAL_TABLET | Freq: Every evening | ORAL | Status: DC
  Administered 2022-01-31: 20 mg via ORAL
  Filled 2022-01-30: qty 1

## 2022-01-30 MED ORDER — ONDANSETRON HCL 4 MG/2ML IJ SOLN
4.0000 mg | Freq: Once | INTRAMUSCULAR | Status: AC
  Administered 2022-01-30: 4 mg via INTRAVENOUS
  Filled 2022-01-30: qty 2

## 2022-01-30 MED ORDER — ACETAMINOPHEN 650 MG RE SUPP: 650.0000 mg | Freq: Four times a day (QID) | RECTAL | Status: DC | PRN

## 2022-01-30 MED ORDER — SODIUM CHLORIDE 0.9% FLUSH
3.0000 mL | Freq: Two times a day (BID) | INTRAVENOUS | Status: DC
  Administered 2022-01-31: 3 mL via INTRAVENOUS

## 2022-01-30 MED ORDER — RIVAROXABAN 15 MG PO TABS
15.0000 mg | ORAL_TABLET | Freq: Every day | ORAL | Status: DC
  Filled 2022-01-30: qty 1

## 2022-01-30 MED ORDER — LATANOPROST 0.005 % OP SOLN: 1.0000 [drp] | Freq: Every day | OPHTHALMIC | Status: DC

## 2022-01-30 MED ORDER — VITAMIN B-12 100 MCG PO TABS
100.0000 ug | ORAL_TABLET | Freq: Every day | ORAL | Status: DC
  Administered 2022-01-31: 100 ug via ORAL
  Filled 2022-01-30: qty 1

## 2022-01-30 MED ORDER — TIMOLOL MALEATE 0.5 % OP SOLN: 1.0000 [drp] | Freq: Two times a day (BID) | OPHTHALMIC | Status: DC

## 2022-01-30 MED ORDER — LISINOPRIL 20 MG PO TABS
20.0000 mg | ORAL_TABLET | Freq: Every day | ORAL | Status: DC
  Administered 2022-01-31: 20 mg via ORAL
  Filled 2022-01-30: qty 1

## 2022-01-30 MED ORDER — MORPHINE SULFATE (PF) 2 MG/ML IV SOLN: 2.0000 mg | INTRAVENOUS | Status: DC | PRN

## 2022-01-30 NOTE — Assessment & Plan Note (Signed)
Cont levothyroxine 75 mcg.

## 2022-01-30 NOTE — Assessment & Plan Note (Signed)
Blood pressure (!) 123/44, pulse 67, temperature 97.6 F (36.4 C), resp. rate 18, height '5\' 3"'$  (1.6 m), weight 60.3 kg, SpO2 100 %. We will cut back lisinopril to 20 mg, cont aldactone at 25 mg . No hctz due to hyponatremia.

## 2022-01-30 NOTE — Telephone Encounter (Signed)
  Chief Complaint: Rt shoulder pain Symptoms: Pain since Friday morning 8/10 Frequency: Friday Pertinent Negatives: Patient denies Fever, redness Disposition: '[]'$ ED /'[]'$ Urgent Care (no appt availability in office) / '[x]'$ Appointment(In office/virtual)/ '[]'$  [facility]/ '[]'$ Home Care/ '[]'$ Refused Recommended Disposition /'[]'$ [facility]/ '[]'$  Follow-up with PCP Additional Notes: Pt and her daughter [name] called. Pt has had rt shoulder pain since Friday. PT was processing some green beans last week, but knows of no other activity that may have caused this. Pt had a similar experience last year d/t shucking corn. Pt has given a shot in the shoulder which was very helpful. PT states that pain stopped her from sleeping last night. Pain abates somewhat when arm is not moved.  Reason for Disposition  [1] Unable to use arm at all AND [2] because of shoulder pain or stiffness  Answer Assessment - Initial Assessment Questions 1. ONSET: "When did the pain start?"     Friday morning 2. LOCATION: "Where is the pain located?"     Rt shoulder towards the front 3. PAIN: "How bad is the pain?" (Scale 1-10; or mild, moderate, severe)   - MILD (1-3): doesn't interfere with normal activities   - MODERATE (4-7): interferes with normal activities (e.g., work or school) or awakens from sleep   - SEVERE (8-10): excruciating pain, unable to do any normal activities, unable to move arm at all due to pain     8/10 4. WORK OR EXERCISE: "Has there been any recent work or exercise that involved this part of the body?"     Green beans prep 5. CAUSE: "What do you think is causing the shoulder pain?"     unsure 6. OTHER SYMPTOMS: "Do you have any other symptoms?" (e.g., neck pain, swelling, rash, fever, numbness, weakness)     no 7. PREGNANCY: "Is there any chance you are pregnant?" "When was your last menstrual period?"     na  Protocols used: Shoulder Pain-A-AH

## 2022-01-30 NOTE — ED Triage Notes (Signed)
Pt was seen at [facility] for R arm pain when she became shaky, diaphoretic, and felt like she was going to pass out- pt had EKG and blood work done at walk in- pt was sent here for further eval

## 2022-01-30 NOTE — ED Provider Triage Note (Signed)
Emergency Medicine Provider Triage Evaluation Note  Sydney Mcdaniel , a 88 y.o. female  was evaluated in triage.  Pt complains of right arm pain. She had initially presented to [facility]. EKG and labs were drawn. She then complained that she felt like she was going to pass out. Sent to ER.  Physical Exam  BP (!) 171/114 (BP Location: Right Arm)   Pulse 80   Resp 20   Ht '5\' 3"'$  (1.6 m)   Wt 60.3 kg   LMP  (LMP Unknown)   SpO2 100%   BMI 23.56 kg/m  Gen:   Awake, no distress   Resp:  Normal effort  MSK:   Moves extremities without difficulty  Other:    Medical Decision Making  Medically screening exam initiated at 1:25 PM.  Appropriate orders placed.  Sydney Mcdaniel was informed that the remainder of the evaluation will be completed by another provider, this initial triage assessment does not replace that evaluation, and the importance of remaining in the ED until their evaluation is complete.  Troponin at [facility] normal.   Victorino Dike, FNP 01/30/22 1327

## 2022-01-30 NOTE — ED Triage Notes (Signed)
First nurse note- patient wheeled over from [facility] stating patient came over there for right shoulder pain. States EKG had some changes and drew lab work. Patient here with daughter.

## 2022-01-30 NOTE — ED Provider Notes (Signed)
[facility] Provider Note    Event Date/Time   First MD Initiated Contact with Patient 01/30/22 1537     (approximate)   History   Near Syncope   HPI  Sydney Mcdaniel is a 88 y.o. female   with past medical history of hypertension, hyperlipidemia, CLL, chronic hyponatremia and SIADH, here with generalized weakness and right arm pain.  Patient initially presented to her PCP for right upper arm pain.  Pain is aching and throbbing.  Denies any trauma.  Pain is worse with certain movements and palpation.  Denies any weakness or numbness.  The patient also has had worsening generalized weakness, tremors, and some mild confusion.  She told her PCP about this earlier today, and was sent to the ED for evaluation after lab work showed worsening hyponatremia.  She has had decreased appetite.  No seizures.  She has felt lightheaded with standing.      Physical Exam   Triage Vital Signs: ED Triage Vitals  Enc Vitals Group     BP 01/30/22 1306 (!) 171/114     Pulse Rate 01/30/22 1306 80     Resp 01/30/22 1306 20     Temp 01/30/22 1325 (!) 97.5 F (36.4 C)     Temp Source 01/30/22 1325 Oral     SpO2 01/30/22 1306 100 %     Weight 01/30/22 1322 133 lb (60.3 kg)     Height 01/30/22 1322 '5\' 3"'$  (1.6 m)     Head Circumference --      Peak Flow --      Pain Score 01/30/22 1320 8     Pain Loc --      Pain Edu? --      Excl. in [place]? --     Most recent vital signs: Vitals:   01/30/22 2230 01/31/22 0019  BP: (!) 123/44 (!) 182/72  Pulse: 67 71  Resp: 18 18  Temp: 97.6 F (36.4 C) 97.9 F (36.6 C)  SpO2: 100% 100%     General: Awake, no distress.  CV:  Good peripheral perfusion.  Regular rate and rhythm. Resp:  Normal effort.  Abd:  No distention.  No tenderness. Other:  No lower extremity edema.  Cranial nerves intact.  Strength out of 5 bilateral upper and lower extremities with normal sensation light touch.   ED Results / Procedures / Treatments    Labs (all labs ordered are listed, but only abnormal results are displayed) Labs Reviewed  CBC - Abnormal; Notable for the following components:      Result Value   RBC 3.82 (*)    Hemoglobin 11.6 (*)    HCT 34.8 (*)    All other components within normal limits  URINALYSIS, ROUTINE W REFLEX MICROSCOPIC - Abnormal; Notable for the following components:   Color, Urine STRAW (*)    APPearance CLEAR (*)    Leukocytes,Ua MODERATE (*)    Bacteria, UA RARE (*)    All other components within normal limits  BASIC METABOLIC PANEL - Abnormal; Notable for the following components:   Sodium 127 (*)    Chloride 94 (*)    CO2 19 (*)    Glucose, Bld 103 (*)    All other components within normal limits  OSMOLALITY, URINE - Abnormal; Notable for the following components:   Osmolality, Ur 257 (*)    All other components within normal limits  HEPATIC FUNCTION PANEL - Abnormal; Notable for the following components:   Alkaline  Phosphatase 27 (*)    All other components within normal limits  OSMOLALITY - Abnormal; Notable for the following components:   Osmolality 264 (*)    All other components within normal limits  CBC - Abnormal; Notable for the following components:   RBC 3.36 (*)    Hemoglobin 10.4 (*)    HCT 31.1 (*)    All other components within normal limits  CREATININE, SERUM - Abnormal; Notable for the following components:   Creatinine, Ser 1.03 (*)    GFR, Estimated 50 (*)    All other components within normal limits  CBG MONITORING, ED - Abnormal; Notable for the following components:   Glucose-Capillary 101 (*)    All other components within normal limits  SODIUM, URINE, RANDOM  CK  LACTIC ACID, PLASMA  COMPREHENSIVE METABOLIC PANEL  CBC  LACTIC ACID, PLASMA  CBG MONITORING, ED  TROPONIN I (HIGH SENSITIVITY)     EKG Atrial paced rhythm, ventricular rate 73.  QRS 134, QTc 5 6.  No acute ST elevations or depressions.   RADIOLOGY CT head: No acute intracranial  abnormality DD shoulder right: Negative DG ribs right with chest: Negative   I also independently reviewed and agree with radiologist interpretations.   PROCEDURES:  Critical Care performed: No  MEDICATIONS ORDERED IN ED: Medications  atorvastatin (LIPITOR) tablet 20 mg (has no administration in time range)  latanoprost (XALATAN) 0.005 % ophthalmic solution 1 drop (has no administration in time range)  levothyroxine (SYNTHROID) tablet 75 mcg (has no administration in time range)  vitamin B-12 (CYANOCOBALAMIN) tablet 100 mcg (has no administration in time range)  timolol (TIMOPTIC) 0.5 % ophthalmic solution 1 drop (has no administration in time range)  spironolactone (ALDACTONE) tablet 25 mg (has no administration in time range)  Rivaroxaban (XARELTO) tablet 15 mg (has no administration in time range)  lisinopril (ZESTRIL) tablet 20 mg (has no administration in time range)  sodium chloride flush (NS) 0.9 % injection 3 mL (has no administration in time range)  0.9 %  sodium chloride infusion (has no administration in time range)  acetaminophen (TYLENOL) tablet 650 mg (has no administration in time range)    Or  acetaminophen (TYLENOL) suppository 650 mg (has no administration in time range)  HYDROcodone-acetaminophen (NORCO/VICODIN) 5-325 MG per tablet 1 tablet (has no administration in time range)  morphine (PF) 2 MG/ML injection 2 mg (has no administration in time range)  morphine (PF) 2 MG/ML injection 2 mg (2 mg Intravenous Given 01/30/22 1620)  ondansetron (ZOFRAN) injection 4 mg (4 mg Intravenous Given 01/30/22 1616)  acetaminophen (TYLENOL) tablet 650 mg (650 mg Oral Given 01/30/22 1621)  sodium chloride 0.9 % bolus 500 mL (0 mLs Intravenous Stopped 01/30/22 1949)     IMPRESSION / MDM / ASSESSMENT AND PLAN / ED COURSE  I reviewed the triage vital signs and the nursing notes.                               The patient is on the cardiac monitor to evaluate for evidence of  arrhythmia and/or significant heart rate changes.   Ddx:  Differential includes the following, with pertinent life- or limb-threatening emergencies considered:  Symptomatic hyponatremia, anemia, polypharmacy, occult infection such as UTI, deconditioning, anxiety  Patient's presentation is most consistent with acute presentation with potential threat to life or bodily function.  MDM:  88 yo F with PMHx CLL, hyponatremia, SIADH, here with generalized weakness and  R arm pain. Re: arm pain - unclear etiology, tp has had no trauma and exam is very reassuring. Distal NV is intact. Plain films obtained, reviewed by me as above, and are negative. Suspect possible cervical radicular sx though no major neck pain. Referred pain from cardiac/pulm source unlikely as EKG nonischemic, CXR clear, troponin is negative. Re: her generalized weakness, she has acute on chronic hyponatremia with Na 127. Pt has had similar sx with hypoNa in the past. Will check labs, admit for treatment and monitoring.   MEDICATIONS GIVEN IN ED: Medications  atorvastatin (LIPITOR) tablet 20 mg (has no administration in time range)  latanoprost (XALATAN) 0.005 % ophthalmic solution 1 drop (has no administration in time range)  levothyroxine (SYNTHROID) tablet 75 mcg (has no administration in time range)  vitamin B-12 (CYANOCOBALAMIN) tablet 100 mcg (has no administration in time range)  timolol (TIMOPTIC) 0.5 % ophthalmic solution 1 drop (has no administration in time range)  spironolactone (ALDACTONE) tablet 25 mg (has no administration in time range)  Rivaroxaban (XARELTO) tablet 15 mg (has no administration in time range)  lisinopril (ZESTRIL) tablet 20 mg (has no administration in time range)  sodium chloride flush (NS) 0.9 % injection 3 mL (has no administration in time range)  0.9 %  sodium chloride infusion (has no administration in time range)  acetaminophen (TYLENOL) tablet 650 mg (has no administration in time range)     Or  acetaminophen (TYLENOL) suppository 650 mg (has no administration in time range)  HYDROcodone-acetaminophen (NORCO/VICODIN) 5-325 MG per tablet 1 tablet (has no administration in time range)  morphine (PF) 2 MG/ML injection 2 mg (has no administration in time range)  morphine (PF) 2 MG/ML injection 2 mg (2 mg Intravenous Given 01/30/22 1620)  ondansetron (ZOFRAN) injection 4 mg (4 mg Intravenous Given 01/30/22 1616)  acetaminophen (TYLENOL) tablet 650 mg (650 mg Oral Given 01/30/22 1621)  sodium chloride 0.9 % bolus 500 mL (0 mLs Intravenous Stopped 01/30/22 1949)     Consults:  Hospitalist   EMR reviewed  Reviewed prior labs/sodium levels Reviewed Cardiology visit for syncope with Dr. Caryl Comes 12/2021     FINAL CLINICAL IMPRESSION(S) / ED DIAGNOSES   Final diagnoses:  Hyponatremia  Right arm pain     Rx / DC Orders   ED Discharge Orders     None        Note:  This document was prepared using Dragon voice recognition software and may include unintentional dictation errors.   Duffy Bruce, MD 01/31/22 [phone]

## 2022-01-30 NOTE — Assessment & Plan Note (Signed)
Atrial paced rhythm.  Cont xarelto/ atorvastatin.

## 2022-01-30 NOTE — Assessment & Plan Note (Signed)
Cont atorvastatin  

## 2022-01-30 NOTE — Assessment & Plan Note (Signed)
Pt reports right arm, and per daughter it had happened before.  Consider Mri shoulder if  pain returns. PT eval upon discharge.

## 2022-01-30 NOTE — Hospital Course (Addendum)
Hyponatremia- rt arm pain. Pain rt shouklldr Confused, sodium is 127.  Similar episde in past.  Gati unctable  Neuro imagine ok. Shoulder pain Anemia Urinalysis today shows moderate leukocytes with 6-10 WBCs nitrite negative straw-colored urine otherwise clear in appearance head CT negative for any acute intracranial abnormalities no acute rib fracture, degenerative changes and right shoulder on x-ray. EKG shows: atrial paced rhythm with a PR interval of 229 QT interval of 506 with LVH criteria left axis deviation. Patient was seen for at [facility] clinic for right arm pain when she became shaky diaphoretic and felt like she was going to pass out patient was sent here for additional evaluation and found to be hyponatremic.

## 2022-01-30 NOTE — Telephone Encounter (Signed)
Spoke with daughter and informed her that [facility] does not do injections. She verbalized understanding and will take her to [facility]

## 2022-01-30 NOTE — Assessment & Plan Note (Signed)
    Latest Ref Rng & Units 01/30/2022    1:28 PM 11/16/2021    1:39 PM 09/13/2021    8:39 AM  CBC  WBC 4.0 - 10.5 K/uL 8.3  7.2  6.8   Hemoglobin 12.0 - 15.0 g/dL 11.6  11.4  10.1   Hematocrit 36.0 - 46.0 % 34.8  34.5  30.0   Platelets 150 - 400 K/uL 205  196  239   we will recommend stool occult/ d/c fosamax and d/w cardiology about d/c xarelto or need to continue as pt is fall risk.

## 2022-01-30 NOTE — ED Notes (Signed)
MD stated pt could eat something. Pt given juice and crackers.

## 2022-01-30 NOTE — H&P (Signed)
History and Physical    Sydney Mcdaniel FXT:[id number] DOB: 04-29-1925 DOA: 01/30/2022  PCP: Teodora Medici, DO    Patient coming from:  Home  Primary Cardiology: Dr.Klein [facility].   Chief Complaint:  Rt arm pain.    HPI:  Sydney Mcdaniel is a 88 y.o. female seen in ed with complaints of rt arm pain  while stringing green beans. Friday her shoulder was hurting. Today her pain got worse and sent her to ed.  Pt is alert and gives history. Does report having diarrhea but none recently.  Patient denies any symptoms of headaches Vision speech gait falls states that ever she will ever since she has a pacemaker she has not fallen for the past 1 year. Delay in admission with patient, due to multiple admits at the same time.  Discussed and apologized to patient for the wait time.  She is very pleasant and states that she heard that somebody had a heart attack. Pt is still taking fosamax.  Pt has past medical history of allergies to alphagan and pravachol. Pt has PMH of anemia and allergy , CMP and CLL, GERD, glaucoma, hypothyroidism, hypertension, osteoporosis, CLL, syncope, carotid artery disease with bilateral ICA stenosis.  ED Course:   Vitals:   01/30/22 2030 01/30/22 2130 01/30/22 2200 01/30/22 2230  BP: (!) 160/64 (!) 149/65 (!) 122/51 (!) 123/44  Pulse: 65 66 68 67  Resp: '18 20 18 18  '$ Temp:    97.6 F (36.4 C)  TempSrc:      SpO2: 100% 98% 97% 100%  Weight:      Height:      In the emergency room patient is alert awake oriented blood pressure is high initially and has normalized to 123/44. BMP showed sodium of 127 chloride 94 consistent with dehydration, bicarb of 19, glucose of 103, normal kidney function, normal LFTs, normal troponin, CBC showing anemia with a hemoglobin of 11.6, normal white count and normal platelet count. Urinalysis shows moderate leukocytes and rare bacteria with 6-10 WBCs. Head CT negative for any acute intracranial  findings EKG shows atrial paced rhythm at 73 with LAD and LVH see image below.  Review of Systems:  Review of Systems  Constitutional:  Positive for diaphoresis.  Musculoskeletal:  Positive for joint pain.    Past Medical History:  Diagnosis Date   Allergy    Anemia    Cardiomyopathy ([place])    a. 02/2019 Echo: EF 60-65%, no rwma, doppler parameters consistent w/ pseudonormalization. Nl RV size/fxn; b. 06/2021 Echo: EF 40-45% w/ sev basal-mid antsept HK. GrII DD. Nl RV size/fxn. Mild MR/AI.   Carotid arterial disease ([place])    a. 04/2020 Carotid U/S: 1-39% bilat ICA stenosis, <50% bilat ECA stenosis; b. 06/2021 Carotid U/S: RICA 2-99%, LICA 24-26%.   Cataract    CLL (chronic lymphocytic leukemia) (HCC)    GERD (gastroesophageal reflux disease)    Glaucoma    H/O: hysterectomy    Total   History of stress test    a. 02/2019 MV: EF 60%, no ischemia/infarct.   Hyperlipidemia    Hypertension    Hypothyroidism    Impaired fasting glucose    Lichen sclerosus    Osteoporosis    Hips   PAF (paroxysmal atrial fibrillation) ([place])    a. 06/15/2016 Event monitor: 4% afib burden; b. CHA2DS2VASc - 4-->Xarelto.   Sinus bradycardia    a. avoid AVN blocking agents.   Syncope    a. 03/2016 Echo: EF  55-60%, no rwma, mild AI/MR, nl PASP; b. 03/2016 48h Holter: no significant arrhythmias/pauses; c. 03/2016 MV: mild apical defect, likely breast attenuation, nl EF, low risk; d. 05/2016 Event monitor: No significant arrhythmia; e. 05/2016 Event monitor: PAF (4%).    Past Surgical History:  Procedure Laterality Date   APPENDECTOMY     EYE SURGERY     Glaucoma   MULTIPLE TOOTH EXTRACTIONS N/A    2 teeth   PACEMAKER IMPLANT N/A 09/22/2021   Procedure: PACEMAKER IMPLANT;  Surgeon: Deboraha Sprang, MD;  Location: [facility];  Service: Cardiovascular;  Laterality: N/A;   TOTAL ABDOMINAL HYSTERECTOMY       reports that she has never smoked. She has never used smokeless tobacco. She reports that  she does not drink alcohol and does not use drugs.  Allergies  Allergen Reactions   Alphagan [Brimonidine] Itching and Rash   Pravachol [Pravastatin Sodium] Itching and Rash    Family History  Problem Relation Age of Onset   Heart attack Mother    Glaucoma Mother    Heart attack Father    Parkinson's disease Brother    Heart attack Brother     Prior to Admission medications   Medication Sig Start Date End Date Taking? Authorizing Provider  acetaminophen (TYLENOL) 500 MG tablet Take 500 mg by mouth every 8 (eight) hours as needed for moderate pain.    [provider]  alendronate (FOSAMAX) 70 MG tablet TAKE 1 TABLET BY MOUTH EVERY FRIDAY ON AN EMPTY STOMACH WITH A FULL GLASS OF WATER 12/20/21   Teodora Medici, DO  atorvastatin (LIPITOR) 20 MG tablet Take 1 tablet (20 mg total) by mouth every evening. 12/29/21   Teodora Medici, DO  Cholecalciferol 25 MCG (1000 UT) tablet Take by mouth.    [provider]  ferrous sulfate 325 (65 FE) MG tablet Take 1 tablet (325 mg total) by mouth daily with breakfast. Patient taking differently: Take 650 mg by mouth daily with breakfast. 05/22/21   Jennye Boroughs, MD  latanoprost (XALATAN) 0.005 % ophthalmic solution Place 1 drop into the right eye at bedtime.  04/24/15   [provider]  levothyroxine (EUTHYROX) 75 MCG tablet TAKE ONE TABLET BY MOUTH ON MONDAYS, [place], [place], AND SUNDAYS 07/05/21   Kathrine Haddock, NP  levothyroxine (EUTHYROX) 88 MCG tablet TAKE 1 TABLET BY MOUTH ON TUESDAYS, THURSDAYS, AND SATURDAYS 07/05/21   Kathrine Haddock, NP  lisinopril (ZESTRIL) 40 MG tablet Take 1 tablet by mouth once daily 01/09/22   Theora Gianotti, NP  midodrine (PROAMATINE) 2.5 MG tablet Take 1 tablet (2.5 mg) by mouth when waking up in the morning & take 1 tablet (2.5 mg) by mouth after your nap 08/30/21   Deboraha Sprang, MD  Netarsudil Dimesylate (RHOPRESSA) 0.02 % SOLN Place 1 drop into the right eye at bedtime.     [provider]  polyethylene glycol (MIRALAX / GLYCOLAX) 17 g packet Take 17 g by mouth daily.    [provider]  Rivaroxaban (XARELTO) 15 MG TABS tablet Take 1 tablet (15 mg total) by mouth daily with supper. 06/01/21   Theora Gianotti, NP  senna (SENOKOT) 8.6 MG tablet Take 1 tablet by mouth daily as needed for constipation. Uses only when miralax does not work. Patient not taking: Reported on 12/29/2021    [provider]  spironolactone (ALDACTONE) 25 MG tablet Take 25 mg by mouth daily.    [provider]  timolol (TIMOPTIC) 0.5 %  ophthalmic solution Place 1 drop into the right eye 2 (two) times daily. Instill 1 drop in the morning and 1 drop at 1700 01/01/20   [provider]  vitamin B-12 (CYANOCOBALAMIN) 100 MCG tablet Take 100 mcg by mouth daily.    [provider]    Physical Exam: Vitals:   01/30/22 2030 01/30/22 2130 01/30/22 2200 01/30/22 2230  BP: (!) 160/64 (!) 149/65 (!) 122/51 (!) 123/44  Pulse: 65 66 68 67  Resp: '18 20 18 18  '$ Temp:    97.6 F (36.4 C)  TempSrc:      SpO2: 100% 98% 97% 100%  Weight:      Height:       Physical Exam Vitals and nursing note reviewed.  Constitutional:      General: She is not in acute distress.    Appearance: Normal appearance. She is not ill-appearing, toxic-appearing or diaphoretic.  HENT:     Head: Normocephalic and atraumatic.     Right Ear: Hearing and external ear normal.     Left Ear: Hearing and external ear normal.     Nose: Nose normal. No nasal deformity.     Mouth/Throat:     Lips: Pink.     Mouth: Mucous membranes are moist.     Tongue: No lesions.  Eyes:     Extraocular Movements: Extraocular movements intact.     Pupils: Pupils are equal, round, and reactive to light.  Neck:     Vascular: No carotid bruit.  Cardiovascular:     Rate and Rhythm: Normal rate and regular rhythm.     Pulses: Normal pulses.     Heart sounds: Normal heart sounds.   Pulmonary:     Effort: Pulmonary effort is normal.     Breath sounds: Normal breath sounds.  Abdominal:     General: Bowel sounds are normal. There is no distension.     Palpations: Abdomen is soft. There is no mass.     Tenderness: There is no abdominal tenderness. There is no guarding.     Hernia: No hernia is present.  Musculoskeletal:        General: No swelling, tenderness or deformity.     Right lower leg: No edema.     Left lower leg: No edema.  Skin:    General: Skin is warm.  Neurological:     General: No focal deficit present.     Mental Status: She is alert and oriented to person, place, and time.     Cranial Nerves: Cranial nerves 2-12 are intact.     Motor: Motor function is intact.  Psychiatric:        Attention and Perception: Attention normal.        Mood and Affect: Mood normal.        Speech: Speech normal.        Behavior: Behavior normal. Behavior is cooperative.        Cognition and Memory: Cognition normal.    Labs on Admission: I have personally reviewed following labs and imaging studies BMET Recent Labs  Lab 01/30/22 1328  NA 127*  K 3.9  CL 94*  CO2 19*  BUN 13  CREATININE 0.89  GLUCOSE 103*   Electrolytes Recent Labs  Lab 01/30/22 1328  CALCIUM 9.7   Sepsis Markers No results for input(s): "LATICACIDVEN", "PROCALCITON", "O2SATVEN" in the last 168 hours. ABG No results for input(s): "PHART", "PCO2ART", "PO2ART" in the last 168 hours. Liver Enzymes Recent Labs  Lab  01/30/22 1752  AST 21  ALT 11  ALKPHOS 27*  BILITOT 0.9  ALBUMIN 3.9   Cardiac Enzymes No results for input(s): "TROPONINI", "PROBNP" in the last 168 hours. No results found for: "DDIMER" Coag's No results for input(s): "APTT", "INR" in the last 168 hours.  No results found for this or any previous visit (from the past 240 hour(s)).   Current Facility-Administered Medications:    0.9 %  sodium chloride infusion, , Intravenous, Continuous, Posey Pronto, Gretta Cool, MD    acetaminophen (TYLENOL) tablet 650 mg, 650 mg, Oral, Q6H PRN **OR** acetaminophen (TYLENOL) suppository 650 mg, 650 mg, Rectal, Q6H PRN, Para Skeans, MD   atorvastatin (LIPITOR) tablet 20 mg, 20 mg, Oral, QPM, Jacey Pelc V, MD   HYDROcodone-acetaminophen (NORCO/VICODIN) 5-325 MG per tablet 1 tablet, 1 tablet, Oral, Q4H PRN, Para Skeans, MD   [START ON 01/31/2022] latanoprost (XALATAN) 0.005 % ophthalmic solution 1 drop, 1 drop, Right Eye, QHS, Para Skeans, MD   [START ON 02/01/2022] levothyroxine (SYNTHROID) tablet 75 mcg, 75 mcg, Oral, Once per day on Sun Mon Wed Fri, Andry Bogden V, MD   [START ON 01/31/2022] lisinopril (ZESTRIL) tablet 20 mg, 20 mg, Oral, Daily, Posey Pronto, Gretta Cool, MD   morphine (PF) 2 MG/ML injection 2 mg, 2 mg, Intravenous, Q4H PRN, Para Skeans, MD   [START ON 01/31/2022] Rivaroxaban (XARELTO) tablet 15 mg, 15 mg, Oral, Q supper, Posey Pronto, Gretta Cool, MD   sodium chloride flush (NS) 0.9 % injection 3 mL, 3 mL, Intravenous, Q12H, Para Skeans, MD   [START ON 01/31/2022] spironolactone (ALDACTONE) tablet 25 mg, 25 mg, Oral, Daily, Para Skeans, MD   [START ON 01/31/2022] timolol (TIMOPTIC) 0.5 % ophthalmic solution 1 drop, 1 drop, Right Eye, BID, Para Skeans, MD   [START ON 01/31/2022] vitamin B-12 (CYANOCOBALAMIN) tablet 100 mcg, 100 mcg, Oral, Daily, Para Skeans, MD  Current Outpatient Medications:    acetaminophen (TYLENOL) 500 MG tablet, Take 500 mg by mouth every 8 (eight) hours as needed for moderate pain., Disp: , Rfl:    alendronate (FOSAMAX) 70 MG tablet, TAKE 1 TABLET BY MOUTH EVERY FRIDAY ON AN EMPTY STOMACH WITH A FULL GLASS OF WATER, Disp: 12 tablet, Rfl: 0   atorvastatin (LIPITOR) 20 MG tablet, Take 1 tablet (20 mg total) by mouth every evening., Disp: 90 tablet, Rfl: 1   Cholecalciferol 25 MCG (1000 UT) tablet, Take by mouth., Disp: , Rfl:    ferrous sulfate 325 (65 FE) MG tablet, Take 1 tablet (325 mg total) by mouth daily with breakfast. (Patient taking differently: Take  650 mg by mouth daily with breakfast.), Disp: , Rfl: 3   latanoprost (XALATAN) 0.005 % ophthalmic solution, Place 1 drop into the right eye at bedtime. , Disp: , Rfl:    levothyroxine (EUTHYROX) 75 MCG tablet, TAKE ONE TABLET BY MOUTH ON MONDAYS, WEDNESDAYS, FRIDAYS, AND SUNDAYS, Disp: 52 tablet, Rfl: 2   levothyroxine (EUTHYROX) 88 MCG tablet, TAKE 1 TABLET BY MOUTH ON TUESDAYS, THURSDAYS, AND SATURDAYS, Disp: 39 tablet, Rfl: 2   lisinopril (ZESTRIL) 40 MG tablet, Take 1 tablet by mouth once daily, Disp: 90 tablet, Rfl: 0   midodrine (PROAMATINE) 2.5 MG tablet, Take 1 tablet (2.5 mg) by mouth when waking up in the morning & take 1 tablet (2.5 mg) by mouth after your nap, Disp: 60 tablet, Rfl: 6   Netarsudil Dimesylate (RHOPRESSA) 0.02 % SOLN, Place 1 drop into the right eye at bedtime., Disp: ,  Rfl:    polyethylene glycol (MIRALAX / GLYCOLAX) 17 g packet, Take 17 g by mouth daily., Disp: , Rfl:    Rivaroxaban (XARELTO) 15 MG TABS tablet, Take 1 tablet (15 mg total) by mouth daily with supper., Disp: 90 tablet, Rfl: 2   senna (SENOKOT) 8.6 MG tablet, Take 1 tablet by mouth daily as needed for constipation. Uses only when miralax does not work. (Patient not taking: Reported on 12/29/2021), Disp: , Rfl:    spironolactone (ALDACTONE) 25 MG tablet, Take 25 mg by mouth daily., Disp: , Rfl:    timolol (TIMOPTIC) 0.5 % ophthalmic solution, Place 1 drop into the right eye 2 (two) times daily. Instill 1 drop in the morning and 1 drop at 1700, Disp: , Rfl:    vitamin B-12 (CYANOCOBALAMIN) 100 MCG tablet, Take 100 mcg by mouth daily., Disp: , Rfl:   COVID-19 Labs No results for input(s): "DDIMER", "FERRITIN", "LDH", "CRP" in the last 72 hours. Lab Results  Component Value Date   SARSCOV2NAA NEGATIVE 05/20/2021   [place] NEGATIVE 03/28/2021   [place] NEGATIVE 02/11/2021   [place] NEGATIVE 04/11/2020    Radiological Exams on Admission: DG Ribs Unilateral W/Chest Right  Result Date:  01/30/2022 CLINICAL DATA:  Right shoulder pain EXAM: RIGHT RIBS AND CHEST - 3+ VIEW COMPARISON:  09/22/2021 FINDINGS: Left pacer in place, unchanged. Heart is normal size. Aortic atherosclerosis. Lungs clear. No effusions or pneumothorax. No visible rib fracture IMPRESSION: No acute cardiopulmonary disease.  No visible rib fracture. Electronically Signed   By: Rolm Baptise M.D.   On: 01/30/2022 17:32   DG Shoulder Right  Result Date: 01/30/2022 CLINICAL DATA:  Right shoulder pain EXAM: RIGHT SHOULDER - 2+ VIEW COMPARISON:  None Available. FINDINGS: Degenerative changes in the right AC and glenohumeral joints. Subacromial spurring. No acute bony abnormality. Specifically, no fracture, subluxation, or dislocation. IMPRESSION: Degenerative changes.  No acute bony abnormality. Electronically Signed   By: Rolm Baptise M.D.   On: 01/30/2022 17:30   CT Head Wo Contrast  Result Date: 01/30/2022 CLINICAL DATA:  Mental status change, unknown cause EXAM: CT HEAD WITHOUT CONTRAST TECHNIQUE: Contiguous axial images were obtained from the base of the skull through the vertex without intravenous contrast. RADIATION DOSE REDUCTION: This exam was performed according to the departmental dose-optimization program which includes automated exposure control, adjustment of the mA and/or kV according to patient size and/or use of iterative reconstruction technique. COMPARISON:  CT head May 20, 2021. FINDINGS: Brain: No evidence of acute infarction, hemorrhage, hydrocephalus, extra-axial collection or mass lesion/mass effect. Generalized atrophy. Chronic microvascular ischemic disease. Vascular: No hyperdense vessel identified. Skull: No acute fracture Sinuses/Orbits: Clear sinuses.  No acute orbital findings. Other: No mastoid effusions. IMPRESSION: No evidence of acute intracranial abnormality. Electronically Signed   By: Margaretha Sheffield M.D.   On: 01/30/2022 14:19    EKG: Independently reviewed.  Atrial fib s/p pacemaker.     Assessment and Plan: * Arm pain, anterior, right Pt reports right arm, and per daughter it had happened before.  Consider Mri shoulder if  pain returns. PT eval upon discharge.   Essential hypertension Blood pressure (!) 123/44, pulse 67, temperature 97.6 F (36.4 C), resp. rate 18, height '5\' 3"'$  (1.6 m), weight 60.3 kg, SpO2 100 %. We will cut back lisinopril to 20 mg, cont aldactone at 25 mg . No hctz due to hyponatremia.   Hyperlipidemia Cont atorvastatin.  Hypothyroidism Cont levothyroxine 75 mcg.   Anemia    Latest Ref Rng &  Units 01/30/2022    1:28 PM 11/16/2021    1:39 PM 09/13/2021    8:39 AM  CBC  WBC 4.0 - 10.5 K/uL 8.3  7.2  6.8   Hemoglobin 12.0 - 15.0 g/dL 11.6  11.4  10.1   Hematocrit 36.0 - 46.0 % 34.8  34.5  30.0   Platelets 150 - 400 K/uL 205  196  239   we will recommend stool occult/ d/c fosamax and d/w cardiology about d/c xarelto or need to continue as pt is fall risk.     Paroxysmal atrial fibrillation (HCC) Atrial paced rhythm.  Cont xarelto/ atorvastatin.      DVT prophylaxis:  Xarelto   Code Status:  Full code    Family Communication:  [name] (Daughter)  [phone] (Mobile)   Disposition Plan:  Home    Consults called:  None   Admission status: Observation.   Para Skeans MD [facility]  6 PM- 2 AM. Please contact me via secure Chat 6 PM-2 AM. [phone] ( Pager ) To contact the [facility] Attending or Consulting provider [place] or covering provider during after hours [place], for this patient.   Check the care team in [facility] and look for a) attending/consulting [facility] provider listed and b) the [facility] team listed Log into [email or web address] and use [place]'s universal password to access. If you do not have the password, please contact the hospital operator. Locate the [facility] provider you are looking for under [facility] and page to a number that you can be directly reached. If you still have difficulty reaching the  provider, please page the [facility] (Director on Call) for the Hospitalists listed on amion for assistance. [email or web address] 01/30/2022, 11:49 PM

## 2022-01-30 NOTE — Progress Notes (Deleted)
   LMP  (LMP Unknown)    Subjective:    Patient ID: Sydney Mcdaniel, female    DOB: [birth year], 88 y.o.   MRN: [id number]  HPI: Sydney Mcdaniel is a 88 y.o. female  No chief complaint on file. Right shoulder pain: She reports that she started having right shoulder pain on Friday.  Last week she was doing some processing of green beans and thinks that might be what exacerbated the pain.  She has had experienced this pain before after shucking corn.  Patient has received   Pt and her daughter [name] called. Pt has had rt shoulder pain since Friday. PT was processing some green beans last week, but knows of no other activity that may have caused this. Pt had a similar experience last year d/t shucking corn. Pt has given a shot in the shoulder which was very helpful. PT states that pain stopped her from sleeping last night. Pain abates somewhat when arm is not moved.   Relevant past medical, surgical, family and social history reviewed and updated as indicated. Interim medical history since our last visit reviewed. Allergies and medications reviewed and updated.  Review of Systems  Per HPI unless specifically indicated above     Objective:    LMP  (LMP Unknown)   Wt Readings from Last 3 Encounters:  12/29/21 136 lb 6.4 oz (61.9 kg)  11/16/21 137 lb 3.2 oz (62.2 kg)  11/02/21 136 lb 14.4 oz (62.1 kg)    Physical Exam  Results for orders placed or performed in visit on 12/23/21  CUP PACEART REMOTE DEVICE CHECK  Result Value Ref Range   Pulse Generator Manufacturer BITR    Date Time Interrogation Session [id number]    Pulse Gen Model 627035 [place] 8 DR-T    Pulse Gen Serial Number [id number]    Clinic Name [facility]    Implantable Pulse Generator Type Implantable Pulse Generator    Implantable Pulse Generator Implant Date [id number]    Implantable Lead Manufacturer BITR    Implantable Lead Model 377 180 [place] T 53    Implantable Lead Serial Number [id number]    Implantable  Lead Implant Date [id number]    Implantable Lead Location Detail 1 UNKNOWN    Implantable Lead Location [id number]    Implantable Lead Manufacturer BITR    Implantable Lead Model 399 626 [place] JT 45    Implantable Lead Serial Number [id number]    Implantable Lead Implant Date [id number]    Implantable Lead Location Detail 1 UNKNOWN    Implantable Lead Location [id number]       Assessment & Plan:   Problem List Items Addressed This Visit   None    Follow up plan: No follow-ups on file.

## 2022-01-31 ENCOUNTER — Encounter: Payer: Self-pay | Admitting: Internal Medicine

## 2022-01-31 DIAGNOSIS — I480 Paroxysmal atrial fibrillation: Secondary | ICD-10-CM

## 2022-01-31 DIAGNOSIS — E222 Syndrome of inappropriate secretion of antidiuretic hormone: Secondary | ICD-10-CM

## 2022-01-31 DIAGNOSIS — R55 Syncope and collapse: Secondary | ICD-10-CM | POA: Diagnosis not present

## 2022-01-31 DIAGNOSIS — M79601 Pain in right arm: Secondary | ICD-10-CM | POA: Diagnosis not present

## 2022-01-31 DIAGNOSIS — I10 Essential (primary) hypertension: Secondary | ICD-10-CM | POA: Diagnosis not present

## 2022-01-31 LAB — COMPREHENSIVE METABOLIC PANEL
ALT: 12 U/L (ref 0–44)
AST: 18 U/L (ref 15–41)
Albumin: 3.8 g/dL (ref 3.5–5.0)
Alkaline Phosphatase: 26 U/L — ABNORMAL LOW (ref 38–126)
Anion gap: 7 (ref 5–15)
BUN: 13 mg/dL (ref 8–23)
CO2: 23 mmol/L (ref 22–32)
Calcium: 9.4 mg/dL (ref 8.9–10.3)
Chloride: 101 mmol/L (ref 98–111)
Creatinine, Ser: 0.8 mg/dL (ref 0.44–1.00)
GFR, Estimated: >60 mL/min (ref >60)
Glucose, Bld: 100 mg/dL — ABNORMAL HIGH (ref 70–99)
Potassium: 4.5 mmol/L (ref 3.5–5.1)
Sodium: 131 mmol/L — ABNORMAL LOW (ref 135–145)
Total Bilirubin: 1 mg/dL (ref 0.3–1.2)
Total Protein: 7.1 g/dL (ref 6.5–8.1)

## 2022-01-31 LAB — LACTIC ACID, PLASMA
Lactic Acid, Venous: 1.5 mmol/L (ref 0.5–1.9)
Lactic Acid, Venous: 1.2 mmol/L (ref 0.5–1.9)

## 2022-01-31 LAB — CREATININE, SERUM
Creatinine, Ser: 1.03 mg/dL — ABNORMAL HIGH (ref 0.44–1.00)
GFR, Estimated: 50 mL/min — ABNORMAL LOW (ref >60)

## 2022-01-31 LAB — CBC
HCT: 32.6 % — ABNORMAL LOW (ref 36.0–46.0)
Hemoglobin: 11.1 g/dL — ABNORMAL LOW (ref 12.0–15.0)
MCH: 31.1 pg (ref 26.0–34.0)
MCHC: 34 g/dL (ref 30.0–36.0)
MCV: 91.3 fL (ref 80.0–100.0)
Platelets: 188 10*3/uL (ref 150–400)
RBC: 3.57 MIL/uL — ABNORMAL LOW (ref 3.87–5.11)
RDW: 13.3 % (ref 11.5–15.5)
WBC: 8.5 10*3/uL (ref 4.0–10.5)
nRBC: 0 % (ref 0.0–0.2)

## 2022-01-31 LAB — CK: Total CK: 49 U/L (ref 38–234)

## 2022-01-31 MED ORDER — FERROUS SULFATE 325 (65 FE) MG PO TABS: 650.0000 mg | ORAL_TABLET | Freq: Every day | ORAL | Status: AC

## 2022-01-31 NOTE — Discharge Summary (Incomplete)
Physician Discharge Summary   Patient: Sydney Mcdaniel MRN: [id number] DOB: December 24, 1924  Admit date:     01/30/2022  Discharge date: {dischdate:26783}  Discharge Physician: Jennye Boroughs   PCP: Teodora Medici, DO   Recommendations at discharge:  {Tip this will not be part of the note when signed- Example include specific recommendations for outpatient follow-up, pending tests to follow-up on. (Optional):26781}  ***  Discharge Diagnoses: Principal Problem:   Arm pain, anterior, right Active Problems:   Essential hypertension   Hyperlipidemia   Hypothyroidism   Anemia   Paroxysmal atrial fibrillation (HCC)  Resolved Problems:   * No resolved hospital problems. [facility] Course: Hyponatremia- rt arm pain. Pain rt shouklldr Confused, sodium is 127.  Similar episde in past.  Gati unctable  Neuro imagine ok. Shoulder pain Anemia Urinalysis today shows moderate leukocytes with 6-10 WBCs nitrite negative straw-colored urine otherwise clear in appearance head CT negative for any acute intracranial abnormalities no acute rib fracture, degenerative changes and right shoulder on x-ray. EKG shows: atrial paced rhythm with a PR interval of 229 QT interval of 506 with LVH criteria left axis deviation. Patient was seen for at [facility] clinic for right arm pain when she became shaky diaphoretic and felt like she was going to pass out patient was sent here for additional evaluation and found to be hyponatremic.  Assessment and Plan: * Arm pain, anterior, right Pt reports right arm, and per daughter it had happened before.  Consider Mri shoulder if  pain returns. PT eval upon discharge.   Essential hypertension Blood pressure (!) 123/44, pulse 67, temperature 97.6 F (36.4 C), resp. rate 18, height '5\' 3"'$  (1.6 m), weight 60.3 kg, SpO2 100 %. We will cut back lisinopril to 20 mg, cont aldactone at 25 mg . No hctz due to hyponatremia.   Hyperlipidemia Cont  atorvastatin.  Hypothyroidism Cont levothyroxine 75 mcg.   Anemia    Latest Ref Rng & Units 01/30/2022    1:28 PM 11/16/2021    1:39 PM 09/13/2021    8:39 AM  CBC  WBC 4.0 - 10.5 K/uL 8.3  7.2  6.8   Hemoglobin 12.0 - 15.0 g/dL 11.6  11.4  10.1   Hematocrit 36.0 - 46.0 % 34.8  34.5  30.0   Platelets 150 - 400 K/uL 205  196  239   we will recommend stool occult/ d/c fosamax and d/w cardiology about d/c xarelto or need to continue as pt is fall risk.     Paroxysmal atrial fibrillation (HCC) Atrial paced rhythm.  Cont xarelto/ atorvastatin.        {Tip this will not be part of the note when signed Body mass index is 24.96 kg/m. , ,  (Optional):26781}  {(NOTE) Pain control PDMP Statment (Optional):26782} Consultants: *** Procedures performed: ***  Disposition: {Plan; Disposition:26390} Diet recommendation:  Discharge Diet Orders (From admission, onward)     Start     Ordered   01/31/22 0000  Diet - low sodium heart healthy        01/31/22 1310           {Diet_Plan:26776} DISCHARGE MEDICATION: Allergies as of 01/31/2022       Reactions   Alphagan [brimonidine] Itching, Rash   Pravachol [pravastatin Sodium] Itching, Rash        Medication List     STOP taking these medications    senna 8.6 MG tablet Commonly known as: SENOKOT       TAKE these medications  acetaminophen 500 MG tablet Commonly known as: TYLENOL Take 500 mg by mouth every 8 (eight) hours as needed for moderate pain.   alendronate 70 MG tablet Commonly known as: FOSAMAX TAKE 1 TABLET BY MOUTH EVERY FRIDAY ON AN EMPTY STOMACH WITH A FULL GLASS OF WATER   atorvastatin 20 MG tablet Commonly known as: LIPITOR Take 1 tablet (20 mg total) by mouth every evening.   Cholecalciferol 25 MCG (1000 UT) tablet Take by mouth.   cyanocobalamin 100 MCG tablet Commonly known as: VITAMIN B12 Take 100 mcg by mouth daily.   ferrous sulfate 325 (65 FE) MG tablet Take 2 tablets (650 mg total)  by mouth daily with breakfast.   latanoprost 0.005 % ophthalmic solution Commonly known as: XALATAN Place 1 drop into the right eye at bedtime.   levothyroxine 88 MCG tablet Commonly known as: Euthyrox TAKE 1 TABLET BY MOUTH ON TUESDAYS, THURSDAYS, AND SATURDAYS   levothyroxine 75 MCG tablet Commonly known as: Euthyrox TAKE ONE TABLET BY MOUTH ON MONDAYS, WEDNESDAYS, FRIDAYS, AND SUNDAYS   lisinopril 40 MG tablet Commonly known as: ZESTRIL Take 1 tablet by mouth once daily   midodrine 2.5 MG tablet Commonly known as: PROAMATINE Take 1 tablet (2.5 mg) by mouth when waking up in the morning & take 1 tablet (2.5 mg) by mouth after your nap   polyethylene glycol 17 g packet Commonly known as: MIRALAX / GLYCOLAX Take 17 g by mouth daily.   Rhopressa 0.02 % Soln Generic drug: Netarsudil Dimesylate Place 1 drop into the right eye at bedtime.   Rivaroxaban 15 MG Tabs tablet Commonly known as: XARELTO Take 1 tablet (15 mg total) by mouth daily with supper.   spironolactone 25 MG tablet Commonly known as: ALDACTONE Take 25 mg by mouth daily.   timolol 0.5 % ophthalmic solution Commonly known as: TIMOPTIC Place 1 drop into the right eye 2 (two) times daily. Instill 1 drop in the morning and 1 drop at 1700        Discharge Exam: Filed Weights   01/30/22 1322 01/31/22 0019  Weight: 60.3 kg 61.9 kg   ***  Condition at discharge: {DC Condition:26389}  The results of significant diagnostics from this hospitalization (including imaging, microbiology, ancillary and laboratory) are listed below for reference.   Imaging Studies: DG Ribs Unilateral W/Chest Right  Result Date: 01/30/2022 CLINICAL DATA:  Right shoulder pain EXAM: RIGHT RIBS AND CHEST - 3+ VIEW COMPARISON:  09/22/2021 FINDINGS: Left pacer in place, unchanged. Heart is normal size. Aortic atherosclerosis. Lungs clear. No effusions or pneumothorax. No visible rib fracture IMPRESSION: No acute cardiopulmonary disease.   No visible rib fracture. Electronically Signed   By: Rolm Baptise M.D.   On: 01/30/2022 17:32   DG Shoulder Right  Result Date: 01/30/2022 CLINICAL DATA:  Right shoulder pain EXAM: RIGHT SHOULDER - 2+ VIEW COMPARISON:  None Available. FINDINGS: Degenerative changes in the right AC and glenohumeral joints. Subacromial spurring. No acute bony abnormality. Specifically, no fracture, subluxation, or dislocation. IMPRESSION: Degenerative changes.  No acute bony abnormality. Electronically Signed   By: Rolm Baptise M.D.   On: 01/30/2022 17:30   CT Head Wo Contrast  Result Date: 01/30/2022 CLINICAL DATA:  Mental status change, unknown cause EXAM: CT HEAD WITHOUT CONTRAST TECHNIQUE: Contiguous axial images were obtained from the base of the skull through the vertex without intravenous contrast. RADIATION DOSE REDUCTION: This exam was performed according to the departmental dose-optimization program which includes automated exposure control, adjustment of the mA  and/or kV according to patient size and/or use of iterative reconstruction technique. COMPARISON:  CT head May 20, 2021. FINDINGS: Brain: No evidence of acute infarction, hemorrhage, hydrocephalus, extra-axial collection or mass lesion/mass effect. Generalized atrophy. Chronic microvascular ischemic disease. Vascular: No hyperdense vessel identified. Skull: No acute fracture Sinuses/Orbits: Clear sinuses.  No acute orbital findings. Other: No mastoid effusions. IMPRESSION: No evidence of acute intracranial abnormality. Electronically Signed   By: Margaretha Sheffield M.D.   On: 01/30/2022 14:19    Microbiology: Results for orders placed or performed during the hospital encounter of 05/20/21  Urine Culture     Status: Abnormal   Collection Time: 05/19/21  9:50 PM   Specimen: Urine, Random  Result Value Ref Range Status   Specimen Description   Final    URINE, RANDOM Performed at [facility], [address], [place], [place] [address]     Special Requests   Final    NONE Performed at [facility], [address] [place], [place] [address]    Culture (A)  Final    <10,000 COLONIES/mL INSIGNIFICANT GROWTH Performed at [facility], [place] [address], [place], [place] [address]    Report Status 05/21/2021 FINAL  Final  Resp Panel by RT-PCR (Flu A&B, Covid) Nasopharyngeal Swab     Status: Abnormal   Collection Time: 05/20/21  1:04 AM   Specimen: Nasopharyngeal Swab; Nasopharyngeal(NP) swabs in vial transport medium  Result Value Ref Range Status   SARS Coronavirus 2 by RT PCR NEGATIVE NEGATIVE Final    Comment: (NOTE) SARS-CoV-2 target nucleic acids are NOT DETECTED.  The SARS-CoV-2 RNA is generally detectable in upper respiratory specimens during the acute phase of infection. The lowest concentration of SARS-CoV-2 viral copies this assay can detect is 138 copies/mL. A negative result does not preclude SARS-Cov-2 infection and should not be used as the sole basis for treatment or other patient management decisions. A negative result may occur with  improper specimen collection/handling, submission of specimen other than nasopharyngeal swab, presence of viral mutation(s) within the areas targeted by this assay, and inadequate number of viral copies(<138 copies/mL). A negative result must be combined with clinical observations, patient history, and epidemiological information. The expected result is Negative.  Fact Sheet for Patients:  EntrepreneurPulse.com.au  Fact Sheet for Healthcare Providers:  IncredibleEmployment.be  This test is no t yet approved or cleared by the [place] FDA and  has been authorized for detection and/or diagnosis of SARS-CoV-2 by FDA under an Emergency Use Authorization (EUA). This EUA will remain  in effect (meaning this test can be used) for the duration of the COVID-19 declaration under Section 564(b)(1) of the Act, 21 U.S.C.section  360bbb-3(b)(1), unless the authorization is terminated  or revoked sooner.       Influenza A by PCR POSITIVE (A) NEGATIVE Final   Influenza B by PCR NEGATIVE NEGATIVE Final    Comment: (NOTE) The Xpert Xpress SARS-CoV-2/FLU/RSV plus assay is intended as an aid in the diagnosis of influenza from Nasopharyngeal swab specimens and should not be used as a sole basis for treatment. Nasal washings and aspirates are unacceptable for Xpert Xpress SARS-CoV-2/FLU/RSV testing.  Fact Sheet for Patients: EntrepreneurPulse.com.au  Fact Sheet for Healthcare Providers: IncredibleEmployment.be  This test is not yet approved or cleared by the [place] FDA and has been authorized for detection and/or diagnosis of SARS-CoV-2 by FDA under an Emergency Use Authorization (EUA). This EUA will remain in effect (meaning this test can be used) for  the duration of the COVID-19 declaration under Section 564(b)(1) of the Act, 21 U.S.C. section 360bbb-3(b)(1), unless the authorization is terminated or revoked.  Performed at [facility], [address] [place], [place] [address]   Respiratory (~20 pathogens) panel by PCR     Status: Abnormal   Collection Time: 05/20/21  1:04 AM   Specimen: Nasopharyngeal Swab; Respiratory  Result Value Ref Range Status   Adenovirus NOT DETECTED NOT DETECTED Final   Coronavirus 229E NOT DETECTED NOT DETECTED Final    Comment: (NOTE) The Coronavirus on the Respiratory Panel, DOES NOT test for the novel  Coronavirus (2019 nCoV)    Coronavirus HKU1 NOT DETECTED NOT DETECTED Final   Coronavirus NL63 NOT DETECTED NOT DETECTED Final   Coronavirus OC43 NOT DETECTED NOT DETECTED Final   Metapneumovirus NOT DETECTED NOT DETECTED Final   Rhinovirus / Enterovirus NOT DETECTED NOT DETECTED Final   Influenza A H3 DETECTED (A) NOT DETECTED Final   Influenza B NOT DETECTED NOT DETECTED Final   Parainfluenza Virus 1 NOT DETECTED NOT  DETECTED Final   Parainfluenza Virus 2 NOT DETECTED NOT DETECTED Final   Parainfluenza Virus 3 NOT DETECTED NOT DETECTED Final   Parainfluenza Virus 4 NOT DETECTED NOT DETECTED Final   Respiratory Syncytial Virus NOT DETECTED NOT DETECTED Final   Bordetella pertussis NOT DETECTED NOT DETECTED Final   Bordetella Parapertussis NOT DETECTED NOT DETECTED Final   Chlamydophila pneumoniae NOT DETECTED NOT DETECTED Final   Mycoplasma pneumoniae NOT DETECTED NOT DETECTED Final    Comment: Performed at [facility], [address], [place], [place] [address]  Culture, blood (Routine X 2) w Reflex to ID Panel     Status: None   Collection Time: 05/20/21  1:41 AM   Specimen: BLOOD  Result Value Ref Range Status   Specimen Description BLOOD RIGHT [facility]  Final   Special Requests   Final    BOTTLES DRAWN AEROBIC AND ANAEROBIC Blood Culture adequate volume   Culture   Final    NO GROWTH 5 DAYS Performed at [facility], [address] [place], [place] [address]    Report Status 05/25/2021 FINAL  Final  Culture, blood (Routine X 2) w Reflex to ID Panel     Status: None   Collection Time: 05/20/21  1:57 AM   Specimen: BLOOD  Result Value Ref Range Status   Specimen Description BLOOD LEFT AC  Final   Special Requests   Final    BOTTLES DRAWN AEROBIC AND ANAEROBIC Blood Culture adequate volume   Culture   Final    NO GROWTH 5 DAYS Performed at [facility], [address] [place], [place] [address]    Report Status 05/25/2021 FINAL  Final    Labs: CBC: Recent Labs  Lab 01/30/22 1328 01/30/22 2326 01/31/22 0155  WBC 8.3 8.6 8.5  HGB 11.6* 10.4* 11.1*  HCT 34.8* 31.1* 32.6*  MCV 91.1 92.6 91.3  PLT 205 179 295   Basic Metabolic Panel: Recent Labs  Lab 01/30/22 1328 01/30/22 2326 01/31/22 0155  NA 127*  --  131*  K 3.9  --  4.5  CL 94*  --  101  CO2 19*  --  23  GLUCOSE 103*  --  100*  BUN 13  --  13  CREATININE 0.89 1.03* 0.80  CALCIUM 9.7  --  9.4    Liver Function Tests: Recent Labs  Lab 01/30/22 1752 01/31/22 0155  AST 21 18  ALT  11 12  ALKPHOS 27* 26*  BILITOT 0.9 1.0  PROT 6.9 7.1  ALBUMIN 3.9 3.8   CBG: Recent Labs  Lab 01/30/22 1301  GLUCAP 101*    Discharge time spent: {LESS THAN/GREATER THAN:26388} 30 minutes.  Signed: Jennye Boroughs, MD [facility] 01/31/2022

## 2022-01-31 NOTE — TOC Transition Note (Signed)
Transition of Care [facility]) - CM/SW Discharge Note   Patient Details  Name: Sydney Mcdaniel MRN: [id number] Date of Birth: 31-Oct-1924  Transition of [facility]) CM/SW Contact:  Alberteen Sam, LCSW Phone Number: 01/31/2022, 1:19 PM   Clinical Narrative:     Patient to dc home, per daughter [name] agreeable to [facility] PT and OT through [facility], referral given to [place]. No dme needs, daughter [name] to transport at time of discharge.    Final next level of care: [place] Barriers to Discharge: No Barriers Identified   Patient Goals and CMS Choice Patient states their goals for this hospitalization and ongoing recovery are:: to go home CMS Medicare.gov Compare Post Acute Care list provided to:: Patient Choice offered to / list presented to : Patient  Discharge Placement                    Patient and family notified of of transfer: 01/31/22  Discharge Plan and Services                          HH Arranged: PT, OT [facility] Agency: [place] ([place]) Date Standard: 01/31/22 Time [place]: 0119 Representative spoke with at [place]: [place] ([place]) Interventions     Readmission Risk Interventions    05/21/2021    9:19 AM 02/12/2021    3:44 PM  Readmission Risk Prevention Plan  Transportation Screening Complete Complete  PCP or Specialist Appt within 3-5 Days Complete Complete  HRI or Home Care Consult Complete Complete  Social Work Consult for [place] Planning/Counseling Complete Not Complete  SW consult not completed comments  RN Case Manager assigned to patient.  Palliative Care Screening Not Applicable Not Applicable  Medication Review (RN Care Manager) Complete Complete

## 2022-01-31 NOTE — Plan of Care (Signed)

## 2022-01-31 NOTE — Evaluation (Signed)
Physical Therapy Evaluation Patient Details Name: Sydney Mcdaniel MRN: [id number] DOB: February 17, 1925 Today's Date: 01/31/2022  History of Present Illness  presented to ER due to R UE pain, pre-syncopal symptoms; admitted for management of hyponatremia, R UE pain (imaging negative for acute injury)  Clinical Impression  Patient resting in bed upon arrival to room; daughter at bedside, very supportive and encouraging to patient. Patient alert and oriented, follows commands and agreeable to participation with session.  Mild confusion at times, but easily redirected/corrected.  Denies pain to R UE at this time.  Bilat UE/LE strength and ROM grossly symmetrical and WLF; no focal weakness, sensory deficit appreciated. Able to complete bed mobility with mod indep; sit/stand, basic transfers and gait (50') with RW, cga.  Demonstrates min cuing for postural extension and walker position; step height/length improves with open spaces/hallways (and increased cadence). Vitals stable and WFL throughout session; no subjective symptoms of chest pain, pre-syncopal concerns reported. Orthostatics negative. Would benefit from skilled PT to address above deficits and promote optimal return to PLOF.; Recommend transition to HHPT upon discharge from acute hospitalization.        Recommendations for follow up therapy are one component of a multi-disciplinary discharge planning process, led by the attending physician.  Recommendations may be updated based on patient status, additional functional criteria and insurance authorization.  Follow Up Recommendations Home health PT      Assistance Recommended at Discharge None (has needed equipment)  Patient can return home with the following  A little help with walking and/or transfers;A little help with bathing/dressing/bathroom    Equipment Recommendations    Recommendations for Other Services       Functional Status Assessment Patient has had a recent decline in their  functional status and demonstrates the ability to make significant improvements in function in a reasonable and predictable amount of time.     Precautions / Restrictions Precautions Precautions: Fall;ICD/Pacemaker Restrictions Weight Bearing Restrictions: No      Mobility  Bed Mobility Overal bed mobility: Modified Independent                  Transfers Overall transfer level: Needs assistance Equipment used: Rolling walker (2 wheels) Transfers: Sit to/from Stand Sit to Stand: Min guard           General transfer comment: tends to pull on RW despite cuing    Ambulation/Gait Ambulation/Gait assistance: Min guard Gait Distance (Feet): 50 Feet Assistive device: Rolling walker (2 wheels)         General Gait Details: min cuing for postural extension and walker position; step height/length improves with open spaces/hallways (and increased cadence)  Stairs            Wheelchair Mobility    Modified Rankin (Stroke Patients Only)       Balance Overall balance assessment: Needs assistance Sitting-balance support: No upper extremity supported, Feet supported Sitting balance-Leahy Scale: Good     Standing balance support: Bilateral upper extremity supported Standing balance-Leahy Scale: Fair                               Pertinent Vitals/Pain Pain Assessment Pain Assessment: No/denies pain    Home Living Family/patient expects to be discharged to:: Private residence Living Arrangements: Alone Available Help at Discharge: Family;Available 24 hours/day Type of Home: House Home Access: Stairs to enter Entrance Stairs-Rails: Left Entrance Stairs-Number of Steps: 2+1   Home Layout: Multi-level;Able to live  on main level with bedroom/bathroom Home Equipment: Rollator (4 wheels);Cane - single point;Grab bars - toilet;Grab bars - tub/shower      Prior Function Prior Level of Function : Independent/Modified Independent;History of Falls  (last six months)             Mobility Comments: Sup/mod indep for household mobilization with 4WRW "most of the time"; endorses single fall within the previous six months (post-PPM placement) ADLs Comments: Daughter assists with meals and assists her with showering, she is able to dress independently.     Hand Dominance        Extremity/Trunk Assessment   Upper Extremity Assessment Upper Extremity Assessment: Overall WFL for tasks assessed (grossly 4/5 throughout)    Lower Extremity Assessment Lower Extremity Assessment: Overall WFL for tasks assessed (grossly 4/5 throughout)       Communication   Communication: No difficulties  Cognition Arousal/Alertness: Awake/alert Behavior During Therapy: WFL for tasks assessed/performed Overall Cognitive Status: Within Functional Limits for tasks assessed                                 General Comments: Mild confusion at times, but self-corrects; very eager to participate/progress        General Comments      Exercises     Assessment/Plan    PT Assessment Patient needs continued PT services  PT Problem List Decreased strength;Decreased activity tolerance;Decreased balance;Decreased mobility;Decreased knowledge of use of DME;Decreased safety awareness;Decreased knowledge of precautions       PT Treatment Interventions DME instruction;Gait training;Stair training;Functional mobility training;Therapeutic activities;Therapeutic exercise;Balance training;Patient/family education    PT Goals (Current goals can be found in the Care Plan section)  Acute Rehab PT Goals Patient Stated Goal: to go home PT Goal Formulation: With patient Time For Goal Achievement: 02/14/22 Potential to Achieve Goals: Good    Frequency Min 2X/week     Co-evaluation               AM-PAC PT "6 Clicks" Mobility  Outcome Measure Help needed turning from your back to your side while in a flat bed without using bedrails?:  None Help needed moving from lying on your back to sitting on the side of a flat bed without using bedrails?: None Help needed moving to and from a bed to a chair (including a wheelchair)?: A Little Help needed standing up from a chair using your arms (e.g., wheelchair or bedside chair)?: A Little Help needed to walk in hospital room?: A Little Help needed climbing 3-5 steps with a railing? : A Little 6 Click Score: 20    End of Session Equipment Utilized During Treatment: Gait belt Activity Tolerance: Patient tolerated treatment well Patient left: in bed;with call bell/phone within reach;with bed alarm set Nurse Communication: Mobility status PT Visit Diagnosis: Muscle weakness (generalized) (M62.81);Pain Pain - Right/Left: Right Pain - part of body: Shoulder    Time: 0768-0881 PT Time Calculation (min) (ACUTE ONLY): 45 min   Charges:   PT Evaluation $PT Eval Moderate Complexity: 1 Mod PT Treatments $Therapeutic Activity: 8-22 mins        Eiman Maret H. Owens Shark, PT, DPT, NCS 01/31/22, 12:05 PM [phone]

## 2022-02-01 DIAGNOSIS — R531 Weakness: Secondary | ICD-10-CM

## 2022-02-02 ENCOUNTER — Encounter: Payer: Self-pay | Admitting: Internal Medicine

## 2022-02-02 ENCOUNTER — Ambulatory Visit: Payer: Medicare Other | Admitting: Internal Medicine

## 2022-02-02 ENCOUNTER — Telehealth: Payer: Self-pay | Admitting: Internal Medicine

## 2022-02-02 ENCOUNTER — Ambulatory Visit
Admission: RE | Admit: 2022-02-02 | Discharge: 2022-02-02 | Disposition: A | Discharge status: Home / Self Care | Payer: Medicare Other | Source: Ambulatory Visit | Attending: Internal Medicine | Admitting: Internal Medicine

## 2022-02-02 ENCOUNTER — Ambulatory Visit (INDEPENDENT_AMBULATORY_CARE_PROVIDER_SITE_OTHER): Payer: Medicare Other | Admitting: Internal Medicine

## 2022-02-02 ENCOUNTER — Ambulatory Visit
Admission: RE | Admit: 2022-02-02 | Discharge: 2022-02-02 | Disposition: A | Discharge status: Home / Self Care | Payer: Medicare Other | Attending: Internal Medicine | Admitting: Internal Medicine

## 2022-02-02 VITALS — BP 140/62 | HR 72 | Temp 97.9°F | Resp 16 | Wt 138.5 lb

## 2022-02-02 DIAGNOSIS — I10 Essential (primary) hypertension: Secondary | ICD-10-CM | POA: Diagnosis not present

## 2022-02-02 DIAGNOSIS — M810 Age-related osteoporosis without current pathological fracture: Secondary | ICD-10-CM

## 2022-02-02 DIAGNOSIS — E782 Mixed hyperlipidemia: Secondary | ICD-10-CM | POA: Diagnosis not present

## 2022-02-02 DIAGNOSIS — S5001XD Contusion of right elbow, subsequent encounter: Secondary | ICD-10-CM | POA: Diagnosis not present

## 2022-02-02 DIAGNOSIS — M79601 Pain in right arm: Secondary | ICD-10-CM

## 2022-02-02 DIAGNOSIS — R7301 Impaired fasting glucose: Secondary | ICD-10-CM | POA: Diagnosis not present

## 2022-02-02 DIAGNOSIS — I6523 Occlusion and stenosis of bilateral carotid arteries: Secondary | ICD-10-CM | POA: Diagnosis not present

## 2022-02-02 DIAGNOSIS — Z7901 Long term (current) use of anticoagulants: Secondary | ICD-10-CM | POA: Diagnosis not present

## 2022-02-02 DIAGNOSIS — E039 Hypothyroidism, unspecified: Secondary | ICD-10-CM | POA: Diagnosis not present

## 2022-02-02 DIAGNOSIS — K219 Gastro-esophageal reflux disease without esophagitis: Secondary | ICD-10-CM | POA: Diagnosis not present

## 2022-02-02 DIAGNOSIS — I480 Paroxysmal atrial fibrillation: Secondary | ICD-10-CM | POA: Diagnosis not present

## 2022-02-02 DIAGNOSIS — M19011 Primary osteoarthritis, right shoulder: Secondary | ICD-10-CM | POA: Diagnosis not present

## 2022-02-02 DIAGNOSIS — I509 Heart failure, unspecified: Secondary | ICD-10-CM | POA: Diagnosis not present

## 2022-02-02 DIAGNOSIS — E871 Hypo-osmolality and hyponatremia: Secondary | ICD-10-CM

## 2022-02-02 DIAGNOSIS — D649 Anemia, unspecified: Secondary | ICD-10-CM | POA: Diagnosis not present

## 2022-02-02 DIAGNOSIS — Z9181 History of falling: Secondary | ICD-10-CM | POA: Diagnosis not present

## 2022-02-02 DIAGNOSIS — M79631 Pain in right forearm: Secondary | ICD-10-CM | POA: Diagnosis not present

## 2022-02-02 DIAGNOSIS — H269 Unspecified cataract: Secondary | ICD-10-CM | POA: Diagnosis not present

## 2022-02-02 DIAGNOSIS — I429 Cardiomyopathy, unspecified: Secondary | ICD-10-CM | POA: Diagnosis not present

## 2022-02-02 DIAGNOSIS — E222 Syndrome of inappropriate secretion of antidiuretic hormone: Secondary | ICD-10-CM

## 2022-02-02 DIAGNOSIS — Z856 Personal history of leukemia: Secondary | ICD-10-CM | POA: Diagnosis not present

## 2022-02-02 DIAGNOSIS — I110 Hypertensive heart disease with heart failure: Secondary | ICD-10-CM | POA: Diagnosis not present

## 2022-02-02 NOTE — Telephone Encounter (Signed)
Copied from [place] [id number]. Topic: Quick Communication - Home Health Verbal Orders >> Feb 02, 2022 10:11 AM Everette C wrote: Caller/Agency: Gerald Stabs / [facility]  Callback Number: [phone] Requesting OT/PT/Skilled Nursing/Social Work/Speech Therapy: PT Frequency: 2w3 1w5

## 2022-02-02 NOTE — Patient Instructions (Addendum)
It was great seeing you today!  Plan discussed at today's visit: -Blood work ordered today, results will be uploaded to [facility].  -Use compression to help absorb the blood from the IV, instructions below -X-rays today of arm and hand   Follow up in: 3 months   Take care and let us know if you have any questions or concerns prior to your next visit.  Dr. Rosana Berger  Hematoma A hematoma is a collection of blood under the skin, in an organ, in a body space, in a joint space, or in other tissue. The blood can thicken (clot) to form a lump that you can see and feel. The lump is often firm and may become sore and tender. Most hematomas get better in a few days to weeks. However, some hematomas may be serious and require medical care. Hematomas can range from very small to very large. What are the causes? This condition is caused by: A blunt or penetrating injury. A leakage from a blood vessel under the skin. Some medical procedures, including surgeries, such as oral surgery, face lifts, and surgeries on the joints. Some medical conditions that cause bleeding or bruising. There may be multiple hematomas that appear in different areas of the body. What increases the risk? You are more likely to develop this condition if: You are an older adult. You use blood thinners. What are the signs or symptoms?  Symptoms of this condition depend on where the hematoma is located.  Common symptoms of a hematoma that is under the skin include: A firm lump on the body. Pain and tenderness in the area. Bruising. Blue, dark blue, purple-red, or yellowish skin (discoloration) may appear at the site of the hematoma if the hematoma is close to the surface of the skin. Common symptoms of a hematoma that is deep in the tissues or body spaces may be less obvious. They include: A collection of blood in the stomach (intra-abdominal hematoma). This may cause pain in the abdomen, weakness, fainting, and shortness of  breath. A collection of blood in the head (intracranial hematoma). This may cause a headache or symptoms such as weakness, trouble speaking or understanding, or a change in consciousness.  How is this diagnosed? This condition is diagnosed based on: Your medical history. A physical exam. Imaging tests, such as an ultrasound or CT scan. These may be needed if your health care provider suspects a hematoma in deeper tissues or body spaces. Blood tests. These may be needed if your health care provider believes that the hematoma is caused by a medical condition. How is this treated? Treatment for this condition depends on the cause, size, and location of the hematoma. Treatment may include: Doing nothing. The majority of hematomas do not need treatment as many of them go away on their own over time. Surgery or close monitoring. This may be needed for large hematomas or hematomas that affect vital organs. Medicines. Medicines may be given if there is an underlying medical cause for the hematoma. Follow these instructions at home: Managing pain, stiffness, and swelling  If directed, put ice on the affected area. Put ice in a plastic bag. Place a towel between your skin and the bag. Leave the ice on for 20 minutes, 2-3 times a day for the first couple of days. If directed, apply heat to the affected area after applying ice for a couple of days. Use the heat source that your health care provider recommends, such as a moist heat pack or a  heating pad. Place a towel between your skin and the heat source. Leave the heat on for 20-30 minutes. Remove the heat if your skin turns bright red. This is especially important if you are unable to feel pain, heat, or cold. You may have a greater risk of getting burned. Raise (elevate) the affected area above the level of your heart while you are sitting or lying down. If told, wrap the affected area with an elastic bandage. The bandage applies pressure  (compression) to the area, which may help to reduce swelling and promote healing. Do not wrap the bandage too tightly around the affected area. If your hematoma is on a leg or foot (lower extremity) and is painful, your health care provider may recommend crutches. Use them as told by your health care provider. General instructions Take over-the-counter and prescription medicines only as told by your health care provider. Keep all follow-up visits as told by your health care provider. This is important. Contact a health care provider if: You have a fever. The swelling or discoloration gets worse. You develop more hematomas. Get help right away if: Your pain is worse or your pain is not controlled with medicine. Your skin over the hematoma breaks or starts bleeding. Your hematoma is in your chest or abdomen and you have weakness, shortness of breath, or a change in consciousness. You have a hematoma on your scalp that is caused by a fall or injury, and you also have: A headache that gets worse. Trouble speaking or understanding speech. Weakness. Change in alertness or consciousness. Summary A hematoma is a collection of blood under the skin, in an organ, in a body space, in a joint space, or in other tissue. This condition usually does not need treatment because many hematomas go away on their own over time. Large hematomas, or those that may affect vital organs, may need surgical drainage or monitoring. If the hematoma is caused by a medical condition, medicines may be prescribed. Get help right away if your hematoma breaks or starts to bleed, you have shortness of breath, or you have a headache or trouble speaking after a fall. This information is not intended to replace advice given to you by your health care provider. Make sure you discuss any questions you have with your health care provider. Document Revised: 04/07/2021 Document Reviewed: 04/07/2021 Elsevier Patient Education  [place].

## 2022-02-02 NOTE — Progress Notes (Signed)
Established Patient Office Visit  Subjective   Patient ID: Sydney Mcdaniel, female    DOB: [birth year]  Age: 88 y.o. MRN: [id number]  Chief Complaint  Patient presents with   er FOLLOW-UP    HPI Sydney Mcdaniel is a 88 year old female presenting for follow up on chronic medical conditions and ER follow up.  Her daughter is with her to help provide the history.  Discharge Date: 01/31/22 Diagnosis: Hyponatremia, right arm pain Procedures/tests: see below Consultants: None New medications: None Discontinued medications: Senna Discharge instructions:  Monitor blood pressure and recheck sodium  Status: stable   Presented to the ER with weakness, near syncope, right arm and shoulder pain. Sodium initially was 125. She was treated with IV fluids with improvement of sodium level to 131. X-ray of the right shoulder showed osteoarthritis. PT recommended home health therapy, which is in the process of being set up.  Right Arm Pain: Started around her shoulder and upper arm but now stating it is primarily in her distal forearm.  She states the pain occurs randomly and she denies trauma or falls.  She denies weakness, numbness or tingling or decreased grip strength.  Patient states the pain mainly occurs at night and will wake her from sleep.  Hypertension/A.Fib/CHF: -Medications: Lisinopril 40, Xarelto 15 mg, Spirolactone 25 mg. Also on Midodrine 2.25 BID prior to pacemaker. Amlodipine had been discontinued previously due to hypotension and bradycardia -Patient is compliant with above medications and reports no side effects at this point. -Checking BP at home (average): 125-140/54-80 -Denies any SOB, CP, vision changes, LE edema or symptoms of hypotension -Following with Cardiology, Dr. Caryl Comes, note from 12/29/21 reviewed. -Last TTE in 1/23 with EF of 40-45%, euvolemic today -ICD implanted on 09/22/21, checked on 10/05/21. Doing very well since pacemaker was implanted.  Blood pressure has been stable.   She denies chest pain, palpitations, shortness of breath, dizziness, new focal weakness, imbalance issues. No recent falls since the pacemaker was implanted. Last fall in March.  She does ambulate with some sort of assisted device, including cane or walker at home.  Home physical therapy being set up.  HLD: -Medications: Lipitor 20 mg -Patient is compliant with above medications and reports no side effects.  -Last lipid panel: Lipid Panel     Component Value Date/Time   CHOL 171 11/17/2020 0855   TRIG 147 11/17/2020 0855   HDL 55 11/17/2020 0855   CHOLHDL 3.1 11/17/2020 0855   CHOLHDL 3.1 07/30/2019 0925   VLDL 22 07/30/2019 0925   LDLCALC 90 11/17/2020 0855   LDLCALC 90 07/02/2018 1040   LABVLDL 26 11/17/2020 0855   Hypothyroidism: -Medications: Levothyroxine 88 mcg on Tuesdays, Thursdays and Saturdays and 75 mcg on Mondays, Wednesdays, Fridays and Sundays -Patient is compliant with the above medication (s) at the above dose and reports no medication side effects.  -Denies weight changes, cold./heat intolerance, skin changes, anxiety/palpitations  -Last TSH: 9/22 2.05, thyroid panel normal   SIADH: -Following with Nephrology at [facility], note reviewed from 11/03/2021.  Patient's daughter states that since her last several sodiums had been normal that they do not have an upcoming nephrology appointment -Had been on a fluid restriction, however patient's daughter states the only dietary changes the patient has had lately is eating more vegetables from the garden this summer.  Fluid intake has not changed.  Osteoporosis:  -Currently on Fosamax 70 mg weekly for 3 years at this point  -DEXA results from 2/20 showed t  score 1.4, left femur -1.4 and in left forearm -2.8.  -Not currently on Vitamin D or calcium   Review of Systems  Constitutional:  Negative for chills and fever.  Eyes:  Negative for blurred vision.  Respiratory:  Negative for shortness of breath.    Cardiovascular:  Negative for chest pain and palpitations.  Musculoskeletal:  Negative for falls.  Neurological:  Negative for dizziness, tingling and headaches.      Objective:     BP (!) 140/62   Pulse 72   Temp 97.9 F (36.6 C)   Resp 16   Wt 138 lb 8 oz (62.8 kg)   LMP  (LMP Unknown)   SpO2 100%   BMI 25.33 kg/m  BP Readings from Last 3 Encounters:  02/02/22 (!) 140/62  01/31/22 (!) 117/48  12/29/21 (!) 161/79   Wt Readings from Last 3 Encounters:  02/02/22 138 lb 8 oz (62.8 kg)  01/31/22 136 lb 7.4 oz (61.9 kg)  12/29/21 136 lb 6.4 oz (61.9 kg)      Physical Exam Constitutional:      Appearance: Normal appearance.     Comments: Presents in wheelchair.  HENT:     Head: Normocephalic and atraumatic.  Eyes:     Conjunctiva/sclera: Conjunctivae normal.  Cardiovascular:     Rate and Rhythm: Normal rate and regular rhythm.  Pulmonary:     Effort: Pulmonary effort is normal.     Breath sounds: Normal breath sounds.  Musculoskeletal:     Right lower leg: No edema.     Left lower leg: No edema.  Skin:    General: Skin is warm and dry.     Findings: Bruising present.     Comments: Large hematoma on the right elbow  Neurological:     General: No focal deficit present.     Mental Status: She is alert. Mental status is at baseline.     Comments: 5 out of 5 strength in the bilateral upper extremities.  Psychiatric:        Mood and Affect: Mood normal.        Behavior: Behavior normal.      No results found for any visits on 02/02/22.  Last CBC Lab Results  Component Value Date   WBC 8.5 01/31/2022   HGB 11.1 (L) 01/31/2022   HCT 32.6 (L) 01/31/2022   MCV 91.3 01/31/2022   MCH 31.1 01/31/2022   RDW 13.3 01/31/2022   PLT 188 22/29/7989   Last metabolic panel Lab Results  Component Value Date   GLUCOSE 100 (H) 01/31/2022   NA 131 (L) 01/31/2022   K 4.5 01/31/2022   CL 101 01/31/2022   CO2 23 01/31/2022   BUN 13 01/31/2022   CREATININE 0.80  01/31/2022   EGFR 65 09/13/2021   CALCIUM 9.4 01/31/2022   PROT 7.1 01/31/2022   ALBUMIN 3.8 01/31/2022   LABGLOB 2.4 06/22/2015   AGRATIO 1.6 06/22/2015   BILITOT 1.0 01/31/2022   ALKPHOS 26 (L) 01/31/2022   AST 18 01/31/2022   ALT 12 01/31/2022   ANIONGAP 7 01/31/2022   Last lipids Lab Results  Component Value Date   CHOL 171 11/17/2020   HDL 55 11/17/2020   LDLCALC 90 11/17/2020   TRIG 147 11/17/2020   CHOLHDL 3.1 11/17/2020   Last hemoglobin A1c Lab Results  Component Value Date   HGBA1C 6.0 (H) 07/02/2018   Last thyroid functions Lab Results  Component Value Date   TSH 2.05 03/03/2021   T4TOTAL  8.9 03/03/2021   Last vitamin D Lab Results  Component Value Date   VD25OH 35 09/09/2018   Last vitamin B12 and Folate Lab Results  Component Value Date   VITAMINB12 452 11/16/2021   FOLATE 22.0 11/16/2021      The ASCVD Risk score (Arnett DK, et al., 2019) failed to calculate for the following reasons:   The 2019 ASCVD risk score is only valid for ages 31 to 55    Assessment & Plan:   1. Hyponatremia/SIADH (syndrome of inappropriate ADH production) ([place]): Acute on chronic.  Had been following with Dr. Zollie Scale.  Sodium 131 at discharge.  Will recheck today.  Patient denies any changes in fluid intake.  If sodium levels still abnormal today, recommend she check back in with nephrology.  - Basic Metabolic Panel (BMET)  2. Right arm pain: Most likely due to large hematoma, recommend she use compression to increase absorption rate and continue Tylenol as needed.  Will obtain a right forearm x-ray to rule out fracture, as she does have osteoporosis in her left forearm.  - DG Forearm Right; Future  3. Essential hypertension: Chronic and stable.  Blood pressure up slightly today, patient is in pain.  Continue lisinopril 40 mg and spironolactone 25 mg.  4. Mixed hyperlipidemia: Stable.  Continue Lipitor 20 mg.  5. Hypothyroidism, unspecified type: Stable.  Continue  levothyroxine 88 mcg on Tuesdays, Thursdays and Saturdays and 75 mcg on Mondays, Wednesdays, Fridays and Sundays.  Plan to recheck at follow-up.  6. Age-related osteoporosis without current pathological fracture: Discussed risks and benefits of continuing Fosamax.  At this point she has been on the medication for 3 years and her risk of fracture is higher than potential medication side effects.  Plan to recheck DEXA scan at follow-up.  Patient and the patient's daughter agreeable to continue medication until recheck.  Return in about 3 months (around 05/05/2022).    Teodora Medici, DO

## 2022-02-03 LAB — BASIC METABOLIC PANEL
BUN/Creatinine Ratio: 16 (calc) (ref 6–22)
BUN: 16 mg/dL (ref 7–25)
CO2: 24 mmol/L (ref 20–32)
Calcium: 9.4 mg/dL (ref 8.6–10.4)
Chloride: 99 mmol/L (ref 98–110)
Creat: 0.99 mg/dL — ABNORMAL HIGH (ref 0.60–0.95)
Glucose, Bld: 95 mg/dL (ref 65–99)
Potassium: 5 mmol/L (ref 3.5–5.3)
Sodium: 131 mmol/L — ABNORMAL LOW (ref 135–146)

## 2022-02-07 DIAGNOSIS — I480 Paroxysmal atrial fibrillation: Secondary | ICD-10-CM | POA: Diagnosis not present

## 2022-02-07 DIAGNOSIS — I110 Hypertensive heart disease with heart failure: Secondary | ICD-10-CM | POA: Diagnosis not present

## 2022-02-07 DIAGNOSIS — E222 Syndrome of inappropriate secretion of antidiuretic hormone: Secondary | ICD-10-CM | POA: Diagnosis not present

## 2022-02-07 DIAGNOSIS — I509 Heart failure, unspecified: Secondary | ICD-10-CM | POA: Diagnosis not present

## 2022-02-07 DIAGNOSIS — M19011 Primary osteoarthritis, right shoulder: Secondary | ICD-10-CM | POA: Diagnosis not present

## 2022-02-07 DIAGNOSIS — E782 Mixed hyperlipidemia: Secondary | ICD-10-CM | POA: Diagnosis not present

## 2022-02-10 DIAGNOSIS — E782 Mixed hyperlipidemia: Secondary | ICD-10-CM | POA: Diagnosis not present

## 2022-02-10 DIAGNOSIS — I110 Hypertensive heart disease with heart failure: Secondary | ICD-10-CM | POA: Diagnosis not present

## 2022-02-10 DIAGNOSIS — I480 Paroxysmal atrial fibrillation: Secondary | ICD-10-CM | POA: Diagnosis not present

## 2022-02-10 DIAGNOSIS — E222 Syndrome of inappropriate secretion of antidiuretic hormone: Secondary | ICD-10-CM | POA: Diagnosis not present

## 2022-02-10 DIAGNOSIS — I509 Heart failure, unspecified: Secondary | ICD-10-CM | POA: Diagnosis not present

## 2022-02-10 DIAGNOSIS — M19011 Primary osteoarthritis, right shoulder: Secondary | ICD-10-CM | POA: Diagnosis not present

## 2022-02-13 DIAGNOSIS — C9190 Lymphoid leukemia, unspecified not having achieved remission: Secondary | ICD-10-CM | POA: Diagnosis not present

## 2022-02-13 DIAGNOSIS — E871 Hypo-osmolality and hyponatremia: Secondary | ICD-10-CM | POA: Diagnosis not present

## 2022-02-13 DIAGNOSIS — I10 Essential (primary) hypertension: Secondary | ICD-10-CM | POA: Diagnosis not present

## 2022-02-15 DIAGNOSIS — M19011 Primary osteoarthritis, right shoulder: Secondary | ICD-10-CM | POA: Diagnosis not present

## 2022-02-15 DIAGNOSIS — E222 Syndrome of inappropriate secretion of antidiuretic hormone: Secondary | ICD-10-CM | POA: Diagnosis not present

## 2022-02-15 DIAGNOSIS — I509 Heart failure, unspecified: Secondary | ICD-10-CM | POA: Diagnosis not present

## 2022-02-15 DIAGNOSIS — I110 Hypertensive heart disease with heart failure: Secondary | ICD-10-CM | POA: Diagnosis not present

## 2022-02-15 DIAGNOSIS — I480 Paroxysmal atrial fibrillation: Secondary | ICD-10-CM | POA: Diagnosis not present

## 2022-02-15 DIAGNOSIS — E782 Mixed hyperlipidemia: Secondary | ICD-10-CM | POA: Diagnosis not present

## 2022-02-16 DIAGNOSIS — I480 Paroxysmal atrial fibrillation: Secondary | ICD-10-CM | POA: Diagnosis not present

## 2022-02-16 DIAGNOSIS — E222 Syndrome of inappropriate secretion of antidiuretic hormone: Secondary | ICD-10-CM | POA: Diagnosis not present

## 2022-02-16 DIAGNOSIS — I509 Heart failure, unspecified: Secondary | ICD-10-CM | POA: Diagnosis not present

## 2022-02-16 DIAGNOSIS — M19011 Primary osteoarthritis, right shoulder: Secondary | ICD-10-CM | POA: Diagnosis not present

## 2022-02-16 DIAGNOSIS — I110 Hypertensive heart disease with heart failure: Secondary | ICD-10-CM | POA: Diagnosis not present

## 2022-02-16 DIAGNOSIS — E782 Mixed hyperlipidemia: Secondary | ICD-10-CM | POA: Diagnosis not present

## 2022-02-17 ENCOUNTER — Other Ambulatory Visit: Payer: Self-pay

## 2022-02-17 MED ORDER — MIDODRINE HCL 2.5 MG PO TABS: ORAL_TABLET | ORAL | 6 refills | Status: DC

## 2022-02-21 ENCOUNTER — Encounter: Payer: Self-pay | Admitting: Internal Medicine

## 2022-02-22 DIAGNOSIS — I509 Heart failure, unspecified: Secondary | ICD-10-CM | POA: Diagnosis not present

## 2022-02-22 DIAGNOSIS — E222 Syndrome of inappropriate secretion of antidiuretic hormone: Secondary | ICD-10-CM | POA: Diagnosis not present

## 2022-02-22 DIAGNOSIS — I480 Paroxysmal atrial fibrillation: Secondary | ICD-10-CM | POA: Diagnosis not present

## 2022-02-22 DIAGNOSIS — I110 Hypertensive heart disease with heart failure: Secondary | ICD-10-CM | POA: Diagnosis not present

## 2022-02-22 DIAGNOSIS — E782 Mixed hyperlipidemia: Secondary | ICD-10-CM | POA: Diagnosis not present

## 2022-02-22 DIAGNOSIS — M19011 Primary osteoarthritis, right shoulder: Secondary | ICD-10-CM | POA: Diagnosis not present

## 2022-02-22 NOTE — Telephone Encounter (Signed)
Tiffany from [place] called to see if the verbal orders from 8.10.23 for PT 2w3 and 1w5 were given / please advise cb# [phone] option #2

## 2022-02-24 DIAGNOSIS — I480 Paroxysmal atrial fibrillation: Secondary | ICD-10-CM | POA: Diagnosis not present

## 2022-02-24 DIAGNOSIS — E782 Mixed hyperlipidemia: Secondary | ICD-10-CM | POA: Diagnosis not present

## 2022-02-24 DIAGNOSIS — E222 Syndrome of inappropriate secretion of antidiuretic hormone: Secondary | ICD-10-CM | POA: Diagnosis not present

## 2022-02-24 DIAGNOSIS — I509 Heart failure, unspecified: Secondary | ICD-10-CM | POA: Diagnosis not present

## 2022-02-24 DIAGNOSIS — M19011 Primary osteoarthritis, right shoulder: Secondary | ICD-10-CM | POA: Diagnosis not present

## 2022-02-24 DIAGNOSIS — I110 Hypertensive heart disease with heart failure: Secondary | ICD-10-CM | POA: Diagnosis not present

## 2022-02-28 DIAGNOSIS — E871 Hypo-osmolality and hyponatremia: Secondary | ICD-10-CM | POA: Diagnosis not present

## 2022-03-02 DIAGNOSIS — E222 Syndrome of inappropriate secretion of antidiuretic hormone: Secondary | ICD-10-CM | POA: Diagnosis not present

## 2022-03-02 DIAGNOSIS — I110 Hypertensive heart disease with heart failure: Secondary | ICD-10-CM | POA: Diagnosis not present

## 2022-03-02 DIAGNOSIS — I480 Paroxysmal atrial fibrillation: Secondary | ICD-10-CM | POA: Diagnosis not present

## 2022-03-02 DIAGNOSIS — E782 Mixed hyperlipidemia: Secondary | ICD-10-CM | POA: Diagnosis not present

## 2022-03-02 DIAGNOSIS — M19011 Primary osteoarthritis, right shoulder: Secondary | ICD-10-CM | POA: Diagnosis not present

## 2022-03-02 DIAGNOSIS — I509 Heart failure, unspecified: Secondary | ICD-10-CM | POA: Diagnosis not present

## 2022-03-04 DIAGNOSIS — Z7901 Long term (current) use of anticoagulants: Secondary | ICD-10-CM | POA: Diagnosis not present

## 2022-03-04 DIAGNOSIS — H269 Unspecified cataract: Secondary | ICD-10-CM | POA: Diagnosis not present

## 2022-03-04 DIAGNOSIS — Z856 Personal history of leukemia: Secondary | ICD-10-CM | POA: Diagnosis not present

## 2022-03-04 DIAGNOSIS — E222 Syndrome of inappropriate secretion of antidiuretic hormone: Secondary | ICD-10-CM | POA: Diagnosis not present

## 2022-03-04 DIAGNOSIS — I6523 Occlusion and stenosis of bilateral carotid arteries: Secondary | ICD-10-CM | POA: Diagnosis not present

## 2022-03-04 DIAGNOSIS — E782 Mixed hyperlipidemia: Secondary | ICD-10-CM | POA: Diagnosis not present

## 2022-03-04 DIAGNOSIS — E039 Hypothyroidism, unspecified: Secondary | ICD-10-CM | POA: Diagnosis not present

## 2022-03-04 DIAGNOSIS — Z9181 History of falling: Secondary | ICD-10-CM | POA: Diagnosis not present

## 2022-03-04 DIAGNOSIS — R7301 Impaired fasting glucose: Secondary | ICD-10-CM | POA: Diagnosis not present

## 2022-03-04 DIAGNOSIS — I110 Hypertensive heart disease with heart failure: Secondary | ICD-10-CM | POA: Diagnosis not present

## 2022-03-04 DIAGNOSIS — S5001XD Contusion of right elbow, subsequent encounter: Secondary | ICD-10-CM | POA: Diagnosis not present

## 2022-03-04 DIAGNOSIS — D649 Anemia, unspecified: Secondary | ICD-10-CM | POA: Diagnosis not present

## 2022-03-04 DIAGNOSIS — M19011 Primary osteoarthritis, right shoulder: Secondary | ICD-10-CM | POA: Diagnosis not present

## 2022-03-04 DIAGNOSIS — K219 Gastro-esophageal reflux disease without esophagitis: Secondary | ICD-10-CM | POA: Diagnosis not present

## 2022-03-04 DIAGNOSIS — I509 Heart failure, unspecified: Secondary | ICD-10-CM | POA: Diagnosis not present

## 2022-03-04 DIAGNOSIS — I429 Cardiomyopathy, unspecified: Secondary | ICD-10-CM | POA: Diagnosis not present

## 2022-03-04 DIAGNOSIS — M810 Age-related osteoporosis without current pathological fracture: Secondary | ICD-10-CM | POA: Diagnosis not present

## 2022-03-04 DIAGNOSIS — I480 Paroxysmal atrial fibrillation: Secondary | ICD-10-CM | POA: Diagnosis not present

## 2022-03-07 ENCOUNTER — Other Ambulatory Visit: Payer: Self-pay | Admitting: Internal Medicine

## 2022-03-07 DIAGNOSIS — M810 Age-related osteoporosis without current pathological fracture: Secondary | ICD-10-CM

## 2022-03-08 NOTE — Telephone Encounter (Signed)
Requested medication (s) are due for refill today:   Yes  Requested medication (s) are on the active medication list:   Yes  Future visit scheduled:   Yes   Last ordered: 12/20/2021 #12, 0 refills  Returned because bone density is due per protocol.   Requested Prescriptions  Pending Prescriptions Disp Refills   alendronate (FOSAMAX) 70 MG tablet [Pharmacy Med Name: Alendronate Sodium 70 MG Oral Tablet] 12 tablet 0    Sig: TAKE 1 TABLET BY MOUTH EVERY FRIDAY ON AN EMPTY STOMACH WITH A FULL GLASS OF WATER     Endocrinology:  Bisphosphonates Failed - 03/07/2022  9:18 AM      Failed - Vitamin D in normal range and within 360 days    Vit D, 25-Hydroxy  Date Value Ref Range Status  09/09/2018 35 30 - 100 ng/mL Final    Comment:    Vitamin D Status         25-OH Vitamin D: . Deficiency:                    <20 ng/mL Insufficiency:             20 - 29 ng/mL Optimal:                 > or = 30 ng/mL . For 25-OH Vitamin D testing on patients on  D2-supplementation and patients for whom quantitation  of D2 and D3 fractions is required, the QuestAssureD(TM) 25-OH VIT D, (D2,D3), LC/MS/MS is recommended: order  code [phone] (patients >20yr). . For more information on this test, go to: http://education.questdiagnostics.com/faq/FAQ163 (This link is being provided for  informational/educational purposes only.)          Failed - Cr in normal range and within 360 days    Creat  Date Value Ref Range Status  02/02/2022 0.99 (H) 0.60 - 0.95 mg/dL Final   Creatinine, Urine  Date Value Ref Range Status  02/11/2021 23 mg/dL Final    Comment:    [facility] [address] [place] [address]        Failed - Mg Level in normal range and within 360 days    Magnesium  Date Value Ref Range Status  04/13/2020 2.0 1.7 - 2.4 mg/dL Final    Comment:    [facility] [address] [place] [address]        Failed - Phosphate in  normal range and within 360 days    No results found for: "PHOS"       Failed - Bone Mineral Density or Dexa Scan completed in the last 2 years      Passed - Ca in normal range and within 360 days    Calcium  Date Value Ref Range Status  02/02/2022 9.4 8.6 - 10.4 mg/dL Final         Passed - eGFR is 30 or above and within 360 days    GFR, Est African American  Date Value Ref Range Status  10/01/2020 75 > OR = 60 mL/min/1.767mFinal   GFR, Est Non African American  Date Value Ref Range Status  10/01/2020 65 > OR = 60 mL/min/1.7340minal   GFR, Estimated  Date Value Ref Range Status  01/31/2022 >60 >60 mL/min Final    Comment:    (NOTE) Calculated using the CKD-EPI Creatinine Equation (2021)    eGFR  Date Value Ref Range Status  09/13/2021 65 >59 mL/min/1.73 Final         Passed - Valid encounter within last 12 months    Recent Outpatient Visits           1 month ago Hyponatremia   [place], DO   4 months ago Ambulatory dysfunction   [place], DO   8 months ago Aspiration pneumonia of right lower lobe, unspecified aspiration pneumonia type [facility])   [facility] Kathrine Haddock, NP   10 months ago Hyponatremia   [place], NP   11 months ago Hyponatremia   [facility] Myles Gip, DO       Future Appointments             In 1 month Teodora Medici, [facility], [place]   In 4 months  [facility], [facility]

## 2022-03-09 DIAGNOSIS — I480 Paroxysmal atrial fibrillation: Secondary | ICD-10-CM | POA: Diagnosis not present

## 2022-03-09 DIAGNOSIS — I509 Heart failure, unspecified: Secondary | ICD-10-CM | POA: Diagnosis not present

## 2022-03-09 DIAGNOSIS — I110 Hypertensive heart disease with heart failure: Secondary | ICD-10-CM | POA: Diagnosis not present

## 2022-03-09 DIAGNOSIS — M19011 Primary osteoarthritis, right shoulder: Secondary | ICD-10-CM | POA: Diagnosis not present

## 2022-03-09 DIAGNOSIS — E782 Mixed hyperlipidemia: Secondary | ICD-10-CM | POA: Diagnosis not present

## 2022-03-09 DIAGNOSIS — E222 Syndrome of inappropriate secretion of antidiuretic hormone: Secondary | ICD-10-CM | POA: Diagnosis not present

## 2022-03-15 DIAGNOSIS — I110 Hypertensive heart disease with heart failure: Secondary | ICD-10-CM | POA: Diagnosis not present

## 2022-03-15 DIAGNOSIS — I509 Heart failure, unspecified: Secondary | ICD-10-CM | POA: Diagnosis not present

## 2022-03-15 DIAGNOSIS — E782 Mixed hyperlipidemia: Secondary | ICD-10-CM | POA: Diagnosis not present

## 2022-03-15 DIAGNOSIS — E222 Syndrome of inappropriate secretion of antidiuretic hormone: Secondary | ICD-10-CM | POA: Diagnosis not present

## 2022-03-15 DIAGNOSIS — M19011 Primary osteoarthritis, right shoulder: Secondary | ICD-10-CM | POA: Diagnosis not present

## 2022-03-15 DIAGNOSIS — I480 Paroxysmal atrial fibrillation: Secondary | ICD-10-CM | POA: Diagnosis not present

## 2022-03-17 ENCOUNTER — Telehealth: Payer: Self-pay

## 2022-03-17 NOTE — Chronic Care Management (AMB) (Signed)
  Care Coordination   Note   03/17/2022 Name: Sydney Mcdaniel MRN: [id number] DOB: [birth year]  MERRISSA GIACOBBE is a 88 y.o. year old female who sees Teodora Medici, DO for primary care. I reached out to [name] by phone today to offer care coordination services.  Ms. Delia was given information about Care Coordination services today including:   The Care Coordination services include support from the care team which includes your Nurse Coordinator, Clinical Social Worker, or Pharmacist.  The Care Coordination team is here to help remove barriers to the health concerns and goals most important to you. Care Coordination services are voluntary, and the patient may decline or stop services at any time by request to their care team member.   Care Coordination Consent Status: Patient agreed to services and verbal consent obtained.   Follow up plan:  Telephone appointment with care coordination team member scheduled for:  03/22/2022  Encounter Outcome:  Pt. Scheduled  Noreene Larsson, [place], [place] [address] Direct Dial: [phone] [email or web address]

## 2022-03-21 ENCOUNTER — Other Ambulatory Visit: Payer: Self-pay | Admitting: Unknown Physician Specialty

## 2022-03-21 DIAGNOSIS — E039 Hypothyroidism, unspecified: Secondary | ICD-10-CM

## 2022-03-21 NOTE — Telephone Encounter (Signed)
Requested medications are due for refill today.  yes  Requested medications are on the active medications list.  yes  Last refill. 07/05/2021 #39 2 rf  Future visit scheduled.   yes  Notes to clinic.  Labs are expired.    Requested Prescriptions  Pending Prescriptions Disp Refills   levothyroxine (SYNTHROID) 88 MCG tablet [Pharmacy Med Name: Levothyroxine Sodium 88 MCG Oral Tablet] 39 tablet 0    Sig: TAKE 1 TABLET BY MOUTH ON TUESDAYS, THURSDAYS, AND SATURDAYS     Endocrinology:  Hypothyroid Agents Failed - 03/21/2022 10:51 AM      Failed - TSH in normal range and within 360 days    TSH  Date Value Ref Range Status  03/03/2021 2.05 0.40 - 4.50 mIU/L Final         Passed - Valid encounter within last 12 months    Recent Outpatient Visits           1 month ago Hyponatremia   [place], DO   4 months ago Ambulatory dysfunction   [place], DO   8 months ago Aspiration pneumonia of right lower lobe, unspecified aspiration pneumonia type [facility])   [facility] Kathrine Haddock, NP   10 months ago Hyponatremia   [place], NP   11 months ago Hyponatremia   [facility] Myles Gip, DO       Future Appointments             In 1 month Teodora Medici, [facility], [place]   In 4 months  [facility], [facility]

## 2022-03-22 ENCOUNTER — Ambulatory Visit: Payer: Self-pay

## 2022-03-22 NOTE — Patient Instructions (Signed)
Visit Information  Thank you for taking time to visit with me today. Please don't hesitate to contact me if I can be of assistance to you.   Following are the goals we discussed today:   Goals Addressed             This Visit's Progress    RNCM: Effective Management of health and well being       Care Coordination Interventions: Evaluation of current treatment plan related to hyponatremia, dental issues, and falls and safety concerns and patient's adherence to plan as established by provider Advised patient to call the office for changes in conditions, new questions or concerns Provided education to patient re: safety precautions and falls prevention Reviewed medications with patient and discussed compliance. The patient is taking salt tablets to help keep her sodium level up Provided patient with liberalization of sodium in diet, safety and falls prevention educational materials related to being safe in home environment and preventing falls Reviewed scheduled/upcoming provider appointments including 05-09-2022 at 1020 am Discussed plans with patient for ongoing care management follow up and provided patient with direct contact information for care management team Advised patient to discuss changes in conditions, questions, or concerns with provider Screening for signs and symptoms of depression related to chronic disease state  Assessed social determinant of health barriers Spoke to [name], the patients daughter. She agrees to [employer] from the [facility]. Review of the goals of the program and how to reach the [facility]. The patient lives alone but beside of her daughter. She likes to be independent. Is currently working with PT in the home. Likely would like to continue outpatients PT when home PT is completed. The patient has to have some teeth pulled on 04-04-2022. Her daughter took her to the dentist on Monday. Education and support given. Will continue to monitor for changes.            Our next appointment is by telephone on 05-17-2022 at 9 am  Please call the care guide team at [phone] if you need to cancel or reschedule your appointment.   If you are experiencing a Mental Health or [place] or need someone to talk to, please call the Suicide and Crisis Lifeline: 988 call the [place] National Suicide Prevention Lifeline: (507)635-6958 or TTY: (346) 378-3870 TTY (438) 686-4851) to talk to a trained counselor call 1-800-273-TALK (toll free, 24 hour hotline)  Patient verbalizes understanding of instructions and care plan provided today and agrees to view in [place]. Active MyChart status and patient understanding of how to access instructions and care plan via MyChart confirmed with patient.     Telephone follow up appointment with care management team member scheduled for: 05-17-2022 at 0900 am  [place], MSN, [place]  Mobile: [phone]    Community Programs Location  Contact Information Description of Services Cost  A Matter of Balance Class locations vary. Call [place] on Aging for more information.  http://dawson-[email or web address]/ ([phone] 8-Session program addressing the fear of falling and increasing activity levels of older adults Free to minimal cost  A.C.T. By [employer] [address], [place], [place] [address].  BetaBlues.dk [phone]  Personal training, gym, classes including Silver Sneakers* and ACTion for Aging Adults Fee-based  A.H.O.Y. (Add Health to [place]) Airs on Time [employer] 13, M-F at [place]: [employer],  [place] [facility] [address]  [place],  [place], [facility], [address] [facility], [place], [place], [place] [address]  [place] Location: Sharrell Ku. [employer] [place]      [phone]  [phone]  [phone]  [phone]  [phone]  [phone]  [phone]  [phone]  [phone]  [phone]    [phone] A total-body conditioning class for adults 82 and older; designed to increase muscular strength, endurance, range of movement, flexibility, balance, agility and coordination Free  [facility] [employer], [place] [address] [place]      [address]      [phone]      Pilate's class for individualsreturning to exercise after an injury, before or after surgery or for individuals with complex musculoskeletal issues; designed to improve strength, balance , flexibility      $15/class  [place] [address], [place] [address] www.[email or web address].dk [place] classes for beginners to advanced [place], [place] [address] Seniorcenter'[email or web address] www.[email or web address]-[email or web address].[email or web address] O'Brien Chair Exercises Free, ages 36 and older; Ages 58-59 fee based  Marvia Pickles, [facility] [address] [place], [place] [address] Seniorcenter'[email or web address] .gov [phone]  A.H.O.Y. Tai Chi Fee-based Donation based or free  [place] Class locations vary.  Call or email Angela Burke or view website for more information. Info'[email or web address] .[email or web address] GainPain.com.cy.html [phone] Ongoing classes at local YMCAs and gyms Fee-based  Silver Sneakers A.C.T. By [place] [employer]: [place] [address] [place] [phone]  [phone] Classes designed for older adults who want to improve their strength, flexibility, balance and endurance.   Silver sneakers is covered by some insurance plans and includes a fitness center membership at participating locations. Find out more by calling [phone] or visiting www.[email or web address] Covered by some insurance plans  [facility] [place] [phone] A.H.O.Y., fitness room, personal training, fitness classes for injury prevention, strength, balance, flexibility, water fitness classes Ages 55+: $44 for 6 months; Ages 57-54: $8 for 6 months  Tai Chi for Everybody [facility] [address] [place], [place] [address] [email or web address]'[email or web address] .Patsi Sears [phone] Tai Chi classes for beginners to advanced; geared for seniors Donation Based      [employer] ([place] Others Participate in Exercise     Loyal Gambler. Rosana Hoes, PhD, [place] [email or web address] .[email or web address]     [phone]     A comprehensive fitness program for adults.  The program paris senior-level undergraduates Kinesiology students with adults who desire to learn how to exercise safely.  Includes a structural exercise class focusing on functional fitnesss     $100/semester in fall and spring; $75 in summer (no trainers)    *Silver Sneakers is covered by some Personal assistant and includes a  Radio producer at participating locations.  Find out more by calling [phone] or visiting www.[email or web address]  For additional health and human  services resources for senior adults, please contact SeniorLine at [phone] in [place] and [place] at  [phone] in all other areas.Understanding Your Risk for Falls Each year, millions of people have serious injuries from falls. It is important to understand your risk for falling. Talk with your health care provider about your risk and what you can do to lower it. There are actions you can take at home to lower your risk and prevent falls. If you do have a serious fall, make sure to tell your health care provider. Falling once raises your risk of falling again. How can falls affect me? Serious injuries from falls are common. These include: Broken bones, such as hip fractures. Head injuries, such as traumatic brain injuries (TBI) or concussion. A fear of falling can cause you to avoid activities and stay at home. This can make your muscles weaker and actually raise your risk for a fall. What can increase my risk? There are a number of risk factors that increase your risk for falling. The more risk factors you have, the higher your risk of falling. Serious injuries from a fall happen most often to people older than age 23. Children and young adults ages 10-29 are also at higher risk. Common risk factors include: Weakness in the lower body. Lack (deficiency) of vitamin D. Being generally weak or confused due to long-term (chronic) illness. Dizziness or balance problems. Poor vision. Medicines that cause dizziness or drowsiness. These can include medicines for your blood pressure, heart, anxiety, insomnia, or edema, as well as pain medicines and muscle relaxants. Other risk factors include: Drinking alcohol. Having had a fall in the past. Having depression. Having foot pain or wearing improper footwear. Working at a dangerous job. Having any of the following in your home: Tripping hazards, such as floor clutter or loose rugs. Poor lighting. Pets. Having dementia or memory loss. What actions can I take to lower my risk of falling?     Physical activity Maintain physical fitness. Do  strength and balance exercises. Consider taking a regular class to build strength and balance. Yoga and tai chi are good options. Vision Have your eyes checked every year and your vision prescription updated as needed. Walking aids and footwear Wear nonskid shoes. Do not wear high heels. Do not walk around the house in socks or slippers. Use a cane or walker as told by your health care provider. Home safety Attach secure railings on both sides of your stairs. Install grab bars for your tub, shower, and toilet. Use a bath mat in your tub or shower. Use good lighting in all rooms. Keep a flashlight near your bed. Make sure there is a clear path from your bed to the bathroom. Use night-lights. Do not use throw rugs. Make sure all carpeting is taped or tacked down securely. Remove all clutter from walkways and stairways, including extension cords. Repair uneven or broken steps. Avoid walking on icy or slippery surfaces. Walk on the grass instead of on icy or slick sidewalks. Use ice melt to get rid of ice on walkways. Use a cordless phone. Questions to ask your health care provider Can you help me check my risk for a fall? Do any of my medicines make me more likely to fall? Should I take a vitamin D supplement? What exercises can I do to improve my strength and balance? Should I make an appointment to have my vision checked? Do I need a bone density test to check for weak bones or osteoporosis?  Would it help to use a cane or a walker? Where to find more information Centers for Disease Control and Prevention, STEADI: http://www.wolf.info/ Community-Based Fall Prevention Programs: http://www.wolf.info/ National Institute on Aging: http://kim-miller.com/ Contact a health care provider if: You fall at home. You are afraid of falling at home. You feel weak, drowsy, or dizzy. Summary Serious injuries from a fall happen most often to people older than age 85. Children and young adults ages 42-29 are also at higher  risk. Talk with your health care provider about your risks for falling and how to lower those risks. Taking certain precautions at home can lower your risk for falling. If you fall, always tell your health care provider. This information is not intended to replace advice given to you by your health care provider. Make sure you discuss any questions you have with your health care provider. Document Revised: 01/14/2020 Document Reviewed: 01/14/2020 Elsevier Patient Education  [place].    Hyponatremia Hyponatremia is when the amount of salt (sodium) in your blood is too low. When salt levels are low, your body cells may take in extra water. This can cause swelling. The swelling often affects the brain. What are the causes? Certain medical problems or conditions. Vomiting a lot. Having watery poop (diarrhea) often. Sweating too much. Taking certain medicines or using illegal drugs. Fluids given through an IV tube. What increases the risk? Having heart, kidney, or liver failure. Having a medical condition that causes you to have watery poop a lot. Doing very hard exercises. Taking medicines that affect the amount of salt that is in your blood. What are the signs or symptoms? Symptoms of this condition include: Headache. Feeling like you may vomit (nausea). Vomiting. Being very tired. Muscle weakness and cramps. Not wanting to eat as much as normal. Feeling weak or dizzy. Very bad symptoms of this condition include: Confusion. Feeling restless. Having a fast heart rate. Fainting. Seizures. Coma. How is this treated? Treatment for this condition depends on the cause. Treatment may include: Getting fluids through an IV tube that is put into one of your veins. Taking medicines to fix the salt levels in your blood. If medicines are causing the problem, your medicines will need to be changed. Limiting how much water or fluid you take in, in some cases. Monitoring in the  hospital to watch your symptoms. Follow these instructions at home:  Take over-the-counter and prescription medicines only as told by your doctor. Many medicines can make this condition worse. Talk with your doctor about any medicines that you are taking. Do not drink alcohol. Keep all follow-up visits. Contact a doctor if: You feel more like you may vomit. You feel more tired. Your headache gets worse. You feel more confused. You feel weaker. Your symptoms go away and then they come back. Get help right away if: You have a seizure. You faint. You keep having watery poop. You keep vomiting. Summary Hyponatremia is when the amount of salt in your blood is too low. When salt levels are low, you can have swelling throughout the body. The swelling mostly affects the brain. Treatment depends on the cause. Treatment may include IV fluids and changing medicines. This information is not intended to replace advice given to you by your health care provider. Make sure you discuss any questions you have with your health care provider. Document Revised: 12/21/2020 Document Reviewed: 12/21/2020 Elsevier Patient Education  [place].

## 2022-03-22 NOTE — Patient Outreach (Signed)
  Care Coordination   Initial Visit Note   03/22/2022 Name: Sydney Mcdaniel MRN: [id number] DOB: 12/31/1924  Sydney Mcdaniel is a 88 y.o. year old female who sees Teodora Medici, DO for primary care. I  spoke to [name], the patients daughter who is DRP.   What matters to the patients health and wellness today?  Being safe in her home, keeping sodium levels stable and dental work completed.     Goals Addressed             This Visit's Progress    RNCM: Effective Management of health and well being       Care Coordination Interventions: Evaluation of current treatment plan related to hyponatremia, dental issues, and falls and safety concerns and patient's adherence to plan as established by provider Advised patient to call the office for changes in conditions, new questions or concerns Provided education to patient re: safety precautions and falls prevention Reviewed medications with patient and discussed compliance. The patient is taking salt tablets to help keep her sodium level up Provided patient with liberalization of sodium in diet, safety and falls prevention educational materials related to being safe in home environment and preventing falls Reviewed scheduled/upcoming provider appointments including 05-09-2022 at 1020 am Discussed plans with patient for ongoing care management follow up and provided patient with direct contact information for care management team Advised patient to discuss changes in conditions, questions, or concerns with provider Screening for signs and symptoms of depression related to chronic disease state  Assessed social determinant of health barriers Spoke to [name], the patients daughter. She agrees to [employer] from the [facility]. Review of the goals of the program and how to reach the [facility]. The patient lives alone but beside of her daughter. She likes to be independent. Is currently working with PT in the home. Likely would like to continue outpatients PT  when home PT is completed. The patient has to have some teeth pulled on 04-04-2022. Her daughter took her to the dentist on Monday. Education and support given. Will continue to monitor for changes.           SDOH assessments and interventions completed:  Yes  SDOH Interventions Today    Flowsheet Row Most Recent Value  SDOH Interventions   Food Insecurity Interventions Intervention Not Indicated  Housing Interventions Intervention Not Indicated  Transportation Interventions Intervention Not Indicated  Utilities Interventions Intervention Not Indicated  Alcohol Usage Interventions Intervention Not Indicated (Score <7)  Financial Strain Interventions Intervention Not Indicated  Stress Interventions Intervention Not Indicated  Social Connections Interventions Intervention Not Indicated        Care Coordination Interventions Activated:  Yes  Care Coordination Interventions:  Yes, provided   Follow up plan: Follow up call scheduled for 05-17-2022 at 0900 am    Encounter Outcome:  Pt. Visit Completed   Noreene Larsson RN, MSN, [place]  Mobile: [phone]

## 2022-03-23 DIAGNOSIS — H04123 Dry eye syndrome of bilateral lacrimal glands: Secondary | ICD-10-CM | POA: Diagnosis not present

## 2022-03-23 DIAGNOSIS — H01002 Unspecified blepharitis right lower eyelid: Secondary | ICD-10-CM | POA: Diagnosis not present

## 2022-03-23 DIAGNOSIS — H01004 Unspecified blepharitis left upper eyelid: Secondary | ICD-10-CM | POA: Diagnosis not present

## 2022-03-23 DIAGNOSIS — H401132 Primary open-angle glaucoma, bilateral, moderate stage: Secondary | ICD-10-CM | POA: Diagnosis not present

## 2022-03-24 ENCOUNTER — Telehealth: Payer: Self-pay | Admitting: Internal Medicine

## 2022-03-24 ENCOUNTER — Ambulatory Visit (INDEPENDENT_AMBULATORY_CARE_PROVIDER_SITE_OTHER): Payer: Medicare Other

## 2022-03-24 DIAGNOSIS — M19011 Primary osteoarthritis, right shoulder: Secondary | ICD-10-CM | POA: Diagnosis not present

## 2022-03-24 DIAGNOSIS — R55 Syncope and collapse: Secondary | ICD-10-CM | POA: Diagnosis not present

## 2022-03-24 DIAGNOSIS — E222 Syndrome of inappropriate secretion of antidiuretic hormone: Secondary | ICD-10-CM | POA: Diagnosis not present

## 2022-03-24 DIAGNOSIS — I509 Heart failure, unspecified: Secondary | ICD-10-CM | POA: Diagnosis not present

## 2022-03-24 DIAGNOSIS — I480 Paroxysmal atrial fibrillation: Secondary | ICD-10-CM | POA: Diagnosis not present

## 2022-03-24 DIAGNOSIS — I110 Hypertensive heart disease with heart failure: Secondary | ICD-10-CM | POA: Diagnosis not present

## 2022-03-24 DIAGNOSIS — R269 Unspecified abnormalities of gait and mobility: Secondary | ICD-10-CM

## 2022-03-24 DIAGNOSIS — E782 Mixed hyperlipidemia: Secondary | ICD-10-CM | POA: Diagnosis not present

## 2022-03-24 LAB — CUP PACEART REMOTE DEVICE CHECK
Date Time Interrogation Session: 20230929135053
Implantable Lead Implant Date: 20230330
Implantable Lead Implant Date: 20230330
Implantable Lead Location: 753859
Implantable Lead Location: 753860
Implantable Lead Model: 377
Implantable Lead Model: 399
Implantable Lead Serial Number: 8000191707
Implantable Lead Serial Number: 8000584009
Implantable Pulse Generator Implant Date: 20230330
Pulse Gen Model: 407145
Pulse Gen Serial Number: 70372674

## 2022-03-24 NOTE — Telephone Encounter (Signed)
Home Health Verbal Orders - Caller/Agency: [facility] Number: [phone]  Requesting OT/PT/Skilled Nursing/Social Work/Speech Therapy: PT  He wants  referral faxed over to [facility] for PT .  He is her home PT but she can go to [facility] because she has been there before.  FAX [phone]  Frequency: Eval and treat

## 2022-03-27 NOTE — Telephone Encounter (Signed)
PT wanted a referral placed to [facility]

## 2022-03-29 DIAGNOSIS — E222 Syndrome of inappropriate secretion of antidiuretic hormone: Secondary | ICD-10-CM | POA: Diagnosis not present

## 2022-03-29 DIAGNOSIS — I110 Hypertensive heart disease with heart failure: Secondary | ICD-10-CM | POA: Diagnosis not present

## 2022-03-29 DIAGNOSIS — I480 Paroxysmal atrial fibrillation: Secondary | ICD-10-CM | POA: Diagnosis not present

## 2022-03-29 DIAGNOSIS — M19011 Primary osteoarthritis, right shoulder: Secondary | ICD-10-CM | POA: Diagnosis not present

## 2022-03-29 DIAGNOSIS — E782 Mixed hyperlipidemia: Secondary | ICD-10-CM | POA: Diagnosis not present

## 2022-03-29 DIAGNOSIS — I509 Heart failure, unspecified: Secondary | ICD-10-CM | POA: Diagnosis not present

## 2022-03-29 NOTE — Progress Notes (Signed)
Remote pacemaker transmission.   

## 2022-04-01 ENCOUNTER — Other Ambulatory Visit: Payer: Self-pay | Admitting: Nurse Practitioner

## 2022-04-01 DIAGNOSIS — I10 Essential (primary) hypertension: Secondary | ICD-10-CM

## 2022-04-03 ENCOUNTER — Ambulatory Visit: Payer: Medicare Other | Attending: Internal Medicine

## 2022-04-03 DIAGNOSIS — M6281 Muscle weakness (generalized): Secondary | ICD-10-CM | POA: Insufficient documentation

## 2022-04-03 DIAGNOSIS — R262 Difficulty in walking, not elsewhere classified: Secondary | ICD-10-CM | POA: Insufficient documentation

## 2022-04-03 DIAGNOSIS — R269 Unspecified abnormalities of gait and mobility: Secondary | ICD-10-CM | POA: Diagnosis not present

## 2022-04-03 DIAGNOSIS — R2681 Unsteadiness on feet: Secondary | ICD-10-CM | POA: Diagnosis not present

## 2022-04-03 NOTE — Therapy (Signed)
OUTPATIENT PHYSICAL THERAPY NEURO EVALUATION  Patient Name: Sydney Mcdaniel MRN: [id number] DOB:1925/04/28, 88 y.o., female Today's Date: 04/03/2022  PCP: Teodora Medici, DO REFERRING PROVIDER: Teodora Medici, DO    Past Medical History:  Diagnosis Date   Allergy    Anemia    Cardiomyopathy ([place])    a. 02/2019 Echo: EF 60-65%, no rwma, doppler parameters consistent w/ pseudonormalization. Nl RV size/fxn; b. 06/2021 Echo: EF 40-45% w/ sev basal-mid antsept HK. GrII DD. Nl RV size/fxn. Mild MR/AI.   Carotid arterial disease ([place])    a. 04/2020 Carotid U/S: 1-39% bilat ICA stenosis, <50% bilat ECA stenosis; b. 06/2021 Carotid U/S: RICA 2-44%, LICA 01-02%.   Cataract    CLL (chronic lymphocytic leukemia) (HCC)    GERD (gastroesophageal reflux disease)    Glaucoma    H/O: hysterectomy    Total   History of stress test    a. 02/2019 MV: EF 60%, no ischemia/infarct.   Hyperlipidemia    Hypertension    Hypothyroidism    Impaired fasting glucose    Lichen sclerosus    Osteoporosis    Hips   PAF (paroxysmal atrial fibrillation) ([place])    a. 06/15/2016 Event monitor: 4% afib burden; b. CHA2DS2VASc - 4-->Xarelto.   Sinus bradycardia    a. avoid AVN blocking agents.   Syncope    a. 03/2016 Echo: EF 55-60%, no rwma, mild AI/MR, nl PASP; b. 03/2016 48h Holter: no significant arrhythmias/pauses; c. 03/2016 MV: mild apical defect, likely breast attenuation, nl EF, low risk; d. 05/2016 Event monitor: No significant arrhythmia; e. 05/2016 Event monitor: PAF (4%).   Past Surgical History:  Procedure Laterality Date   APPENDECTOMY     EYE SURGERY     Glaucoma   MULTIPLE TOOTH EXTRACTIONS N/A    2 teeth   PACEMAKER IMPLANT N/A 09/22/2021   Procedure: PACEMAKER IMPLANT;  Surgeon: Deboraha Sprang, MD;  Location: [facility];  Service: Cardiovascular;  Laterality: N/A;   TOTAL ABDOMINAL HYSTERECTOMY     Patient Active Problem List   Diagnosis Date Noted   General weakness  02/01/2022   Arm pain, anterior, right 01/30/2022   SIADH (syndrome of inappropriate ADH production) ([place]) 07/05/2021   Aspiration pneumonia ([place]) 05/21/2021   Elevated troponin 05/20/2021   Fall 02/11/2021   Normocytic anemia 05/31/2020   Goals of care, counseling/discussion 03/08/2020   Positive direct antiglobulin test (DAT) 12/04/2019   Sinus bradycardia 04/09/2019   First degree AV block 04/09/2019   Osteoporosis 08/21/2018   Bilateral carotid artery stenosis 04/19/2018   History of syncope 10/17/2017   MCI (mild cognitive impairment) 08/12/2017   Lung nodules 07/09/2017   Paroxysmal atrial fibrillation ([place]) 04/18/2017   Hyponatremia 10/20/2016   Chronic diastolic congestive heart failure ([place]) 06/28/2016   Syncope, near 05/24/2016   Primary open-angle glaucoma, bilateral, mild stage 05/08/2016   Bladder prolapse, female, acquired 02/25/2016   Asymptomatic bacteriuria 02/25/2016   B12 deficiency 01/11/2016   Anemia 01/10/2016   CLL (chronic lymphocytic leukemia) ([place]) 01/10/2016   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 12/21/2015   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 12/21/2015   Bradycardia 12/21/2015   Lymphocytosis 06/25/2015   Impaired fasting glucose 06/22/2015   Carotid atherosclerosis 06/22/2015   Essential hypertension 12/22/2014   Hyperlipidemia 12/22/2014   Hypothyroidism 12/22/2014    ONSET DATE: 01/30/2022  REFERRING DIAG: R26.9 (ICD-10-CM) - Abnormality of gait and mobility  THERAPY DIAG:  Muscle weakness (generalized)  Unsteadiness on feet  Difficulty in walking, not elsewhere classified  Rationale for Evaluation and Treatment Rehabilitation  SUBJECTIVE:   SUBJECTIVE STATEMENT: Pt 88 y/o female who presents with her daughter, Sydney Mcdaniel, in transport chair (pt holding spc). Pt's daughter provides most of history, reports pt with mild cog deficits at baseline. Sydney Mcdaniel reports pt seen in ED back in August and found to have acute on  chronic hyponatremia and generalized weakness. Additionally her daughter reports pt had pace maker placed this passed March. Pt's daughter states the pt has not been moving as much. Pt did have [facility] following recent hospitalization prior to outpatient therapy. Pt daughter says, "she holds onto the walls," due to poor balance. Pt reports she feels she is going to fall often. Pt fell last Thursday, thinks it may have been a syncopal episode. Daughter states this usually happens when pt needs go to to the bathroom. Pt daughter says pt has ambulated with [facility] PT with walker, although  pt [facility] with [facility]. Reports her strength presentation can change day to day. [name]: family member Sydney Mcdaniel  PERTINENT HISTORY: [name] back in 2022 for weakness, imbalance. She returns for generalized weakness, balance issues and falls following hospitalization 01/30/22-01/31/22 for acute on chronic hyponatremia, near syncope, RUE pain and generalized weakness. Pt biggest concern is her balance. 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   FALLS: Has patient fallen in last 6 months? Yes. Number of falls 1  LIVING ENVIRONMENT: Lives with: lives with their family and lives alone, but daughter lives next door Lives in: House/apartment Stairs:  pt has a ramp, but one step from back porch into the house (pt grabs onto shelves on R hand side) Has following equipment at  home: Single point cane, Walker - 4 wheeled, Wheelchair (manual), shower chair, bed side commode, Grab bars, and Ramped entry  PLOF: Independent  PATIENT GOALS "I want more balance"    OBJECTIVE:   DIAGNOSTIC FINDINGS:   CT head WO contrast (5MM) 8/7/20203 per chart "IMPRESSION: No evidence of acute intracranial abnormality."  COGNITION: Overall cognitive status: History of cognitive impairments - at baseline per pt's daughter, mild cog impairment per chart   SENSATION: Pt daughter reports usually LLE impaired vs. RLE.  Pt able to feel light touch to BLE on exam  COORDINATION: WNL BUE and BLE   EDEMA:  Dtr reports swelling in LLE compared to RLE (reports chronic issue)   POSTURE: rounded shoulders, forward head, and increased thoracic kyphosis    LOWER EXTREMITY MMT:    Grossly 4+/5 BLE exception hip flexors 4-/5 BLE   TRANSFERS: Assistive device utilized: Single point cane  Sit to stand: Modified independence Stand to sit: Modified independence Chair to chair: Modified independence   GAIT: Gait pattern: step to pattern, decreased arm swing- Left, decreased stride length, and narrow BOS, dec B hip ext throughout gait cycle Distance walked: 10 m Assistive device utilized: Single point cane Level of assistance:  Close CGA, pt reports fear of falls/unsteadiness observed   FUNCTIONAL TESTs:  5 times sit to stand: 28 seconds  Timed up and  go (TUG): 32 sec with SPC 10 meter walk test: 0.45 m/s with SPC close CGA due to unsteadiness  Berg Balance Scale: deferred   PATIENT SURVEYS:  FOTO 38 (goal score 47)  TODAY'S TREATMENT:  Reviewed HEP within session: seated march 3 sets of 20 reps ALT LE. Fatiguing for pt.  Instructed pt in safe sit<>stand technique 2x   PATIENT EDUCATION: Education details: exam findings, indications, goals, plan, HEP Person educated: Patient and Child(ren) (adult daughter) Education method: Explanation, Demonstration, Tactile cues,  Verbal cues, and Handouts Education comprehension: verbalized understanding, returned demonstration, verbal cues required, tactile cues required, and needs further education   HOME EXERCISE PROGRAM: Access Code: [email or web address] URL: [email or web address] Date: 04/03/2022 Prepared by: Ricard Dillon  Exercises - Seated March  - 1 x daily - 5 x weekly - 3 sets - 20 reps - alternating leg hold    GOALS: Goals reviewed with patient? Yes  SHORT TERM GOALS: Target date: 05/15/2022  Patient will be independent in home exercise program to improve strength/mobility for better functional independence with ADLs. Baseline: initiated  Goal status: INITIAL    LONG TERM GOALS: Target date: 06/26/2022  Patient will increase FOTO score to equal to or greater than  47   to demonstrate statistically significant improvement in mobility and quality of life.  Baseline: 38 Goal status: INITIAL  2.  Patient (> 94 years old) will complete five times sit to stand test in < 15 seconds indicating an increased LE strength and improved balance. Baseline: 28 sec use of UE Goal status: INITIAL  3. Patient will increase 10 meter walk test to >1.96ms as to improve gait speed for better community ambulation and to reduce fall risk. Baseline: 0.45 m/s w SPC, close CGA Goal status: INITIAL  4.   Patient will reduce timed up and go to <15 seconds to reduce fall risk and demonstrate improved transfer/gait ability. Baseline: 32 sec with SPC Goal status: INITIAL  5.   Patient will increase Berg Balance score by > 6 points to demonstrate decreased fall risk during functional activities. Baseline: to be completed next 1-2 sessions Goal status: INITIAL   ASSESSMENT:  CLINICAL IMPRESSION: Patient is a pleasant 88y.o. female who was seen today for physical therapy evaluation and treatment for generalized weakness and imbalance. Exam indicates deficits in strength, balance, mobility, and gait AEB performance  on MMT, 5xSTS, 10MWT, and TUG. Moreover, TUG and 5xSTS scores indicate pt at increased risk for future falls. The pt will benefit from further skilled PT to address these deficits in order to improve strength, balance, gait, and mobility and to decrease fall risk.    OBJECTIVE IMPAIRMENTS Abnormal gait, decreased activity tolerance, decreased balance, decreased cognition, decreased endurance, decreased knowledge of use of DME, decreased mobility, difficulty walking, decreased strength, impaired vision/preception, improper body mechanics, postural dysfunction, and pain.   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   REHAB POTENTIAL: Good  CLINICAL DECISION MAKING: Evolving/moderate complexity  EVALUATION COMPLEXITY: Moderate  PLAN: PT FREQUENCY: 2x/week  PT DURATION: 12 weeks  PLANNED INTERVENTIONS: Therapeutic exercises, Therapeutic activity, Neuromuscular re-education, Balance training, Gait training, Patient/Family education, Self Care, Joint mobilization, Stair training, Vestibular training, Canalith repositioning, Orthotic/Fit training, DME instructions, Wheelchair mobility training, Spinal mobilization, Cryotherapy, Moist heat, scar mobilization, Splintting,  Taping, Ultrasound, Parrafin, Biofeedback, Manual therapy, and Re-evaluation  PLAN FOR NEXT SESSION: complete BERG, strength, endurance, balance and gait   Zollie Pee, PT 04/03/2022, 9:22 AM

## 2022-04-05 ENCOUNTER — Ambulatory Visit: Payer: Medicare Other

## 2022-04-12 ENCOUNTER — Ambulatory Visit: Payer: Medicare Other

## 2022-04-12 DIAGNOSIS — M6281 Muscle weakness (generalized): Secondary | ICD-10-CM | POA: Diagnosis not present

## 2022-04-12 DIAGNOSIS — R2681 Unsteadiness on feet: Secondary | ICD-10-CM

## 2022-04-12 DIAGNOSIS — R262 Difficulty in walking, not elsewhere classified: Secondary | ICD-10-CM

## 2022-04-12 DIAGNOSIS — Z23 Encounter for immunization: Secondary | ICD-10-CM | POA: Diagnosis not present

## 2022-04-12 DIAGNOSIS — R269 Unspecified abnormalities of gait and mobility: Secondary | ICD-10-CM | POA: Diagnosis not present

## 2022-04-12 NOTE — Therapy (Signed)
OUTPATIENT PHYSICAL THERAPY NEURO TREATMENT NOTE  Patient Name: Sydney Mcdaniel MRN: [id number] DOB:10/04/1924, 88 y.o., female Today's Date: 04/12/2022  PCP: Teodora Medici, DO REFERRING PROVIDER: Teodora Medici, DO    Past Medical History:  Diagnosis Date   Allergy    Anemia    Cardiomyopathy ([place])    a. 02/2019 Echo: EF 60-65%, no rwma, doppler parameters consistent w/ pseudonormalization. Nl RV size/fxn; b. 06/2021 Echo: EF 40-45% w/ sev basal-mid antsept HK. GrII DD. Nl RV size/fxn. Mild MR/AI.   Carotid arterial disease ([place])    a. 04/2020 Carotid U/S: 1-39% bilat ICA stenosis, <50% bilat ECA stenosis; b. 06/2021 Carotid U/S: RICA 4-09%, LICA 81-19%.   Cataract    CLL (chronic lymphocytic leukemia) (HCC)    GERD (gastroesophageal reflux disease)    Glaucoma    H/O: hysterectomy    Total   History of stress test    a. 02/2019 MV: EF 60%, no ischemia/infarct.   Hyperlipidemia    Hypertension    Hypothyroidism    Impaired fasting glucose    Lichen sclerosus    Osteoporosis    Hips   PAF (paroxysmal atrial fibrillation) ([place])    a. 06/15/2016 Event monitor: 4% afib burden; b. CHA2DS2VASc - 4-->Xarelto.   Sinus bradycardia    a. avoid AVN blocking agents.   Syncope    a. 03/2016 Echo: EF 55-60%, no rwma, mild AI/MR, nl PASP; b. 03/2016 48h Holter: no significant arrhythmias/pauses; c. 03/2016 MV: mild apical defect, likely breast attenuation, nl EF, low risk; d. 05/2016 Event monitor: No significant arrhythmia; e. 05/2016 Event monitor: PAF (4%).   Past Surgical History:  Procedure Laterality Date   APPENDECTOMY     EYE SURGERY     Glaucoma   MULTIPLE TOOTH EXTRACTIONS N/A    2 teeth   PACEMAKER IMPLANT N/A 09/22/2021   Procedure: PACEMAKER IMPLANT;  Surgeon: Deboraha Sprang, MD;  Location: [facility];  Service: Cardiovascular;  Laterality: N/A;   TOTAL ABDOMINAL HYSTERECTOMY     Patient Active Problem List   Diagnosis Date Noted   General weakness  02/01/2022   Arm pain, anterior, right 01/30/2022   SIADH (syndrome of inappropriate ADH production) ([place]) 07/05/2021   Aspiration pneumonia ([place]) 05/21/2021   Elevated troponin 05/20/2021   Fall 02/11/2021   Normocytic anemia 05/31/2020   Goals of care, counseling/discussion 03/08/2020   Positive direct antiglobulin test (DAT) 12/04/2019   Sinus bradycardia 04/09/2019   First degree AV block 04/09/2019   Osteoporosis 08/21/2018   Bilateral carotid artery stenosis 04/19/2018   History of syncope 10/17/2017   MCI (mild cognitive impairment) 08/12/2017   Lung nodules 07/09/2017   Paroxysmal atrial fibrillation ([place]) 04/18/2017   Hyponatremia 10/20/2016   Chronic diastolic congestive heart failure ([place]) 06/28/2016   Syncope, near 05/24/2016   Primary open-angle glaucoma, bilateral, mild stage 05/08/2016   Bladder prolapse, female, acquired 02/25/2016   Asymptomatic bacteriuria 02/25/2016   B12 deficiency 01/11/2016   Anemia 01/10/2016   CLL (chronic lymphocytic leukemia) ([place]) 01/10/2016   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 12/21/2015   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 12/21/2015   Bradycardia 12/21/2015   Lymphocytosis 06/25/2015   Impaired fasting glucose 06/22/2015   Carotid atherosclerosis 06/22/2015   Essential hypertension 12/22/2014   Hyperlipidemia 12/22/2014   Hypothyroidism 12/22/2014    ONSET DATE: 01/30/2022  REFERRING DIAG: R26.9 (ICD-10-CM) - Abnormality of gait and mobility  THERAPY DIAG:  Muscle weakness (generalized)  Unsteadiness on  feet  Difficulty in walking, not elsewhere classified  Rationale for Evaluation and Treatment Rehabilitation  SUBJECTIVE:   SUBJECTIVE STATEMENT: Pt reports no pain currently. Pt had 5 teeth pulled at the dentist, which is why she missed previous appointment.. No stumbles/falls since last appointment. Pt performed some of her HEP but having trouble with motivation. [name]: family member  [name]  PERTINENT HISTORY: [name] back in 2022 for weakness, imbalance. She returns for generalized weakness, balance issues and falls following hospitalization 01/30/22-01/31/22 for acute on chronic hyponatremia, near syncope, RUE pain and generalized weakness. Pt biggest concern is her balance. 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   FALLS: Has patient fallen in last 6 months? Yes. Number of falls 1  LIVING ENVIRONMENT: Lives with: lives with their family and lives alone, but daughter lives next door Lives in: House/apartment Stairs:  pt has a ramp, but one step from back porch into the house (pt grabs onto shelves on R hand side) Has following equipment at home: Single point cane, Walker - 4 wheeled, Wheelchair (manual), shower chair, bed side commode, Grab bars, and Ramped entry  PLOF: Independent  PATIENT GOALS "I want more balance"    OBJECTIVE:   DIAGNOSTIC FINDINGS:   CT head WO contrast (5MM) 8/7/20203 per chart "IMPRESSION: No evidence of acute intracranial abnormality."  COGNITION: Overall cognitive status: History of cognitive impairments - at baseline per pt's daughter, mild cog impairment per chart   SENSATION: Pt daughter reports usually LLE impaired vs. RLE.  Pt able to feel light touch to BLE on exam  COORDINATION: WNL BUE and BLE   EDEMA:  Dtr reports swelling in LLE compared to RLE (reports chronic  issue)   POSTURE: rounded shoulders, forward head, and increased thoracic kyphosis    LOWER EXTREMITY MMT:    Grossly 4+/5 BLE exception hip flexors 4-/5 BLE   TRANSFERS: Assistive device utilized: Single point cane  Sit to stand: Modified independence Stand to sit: Modified independence Chair to chair: Modified independence   GAIT: Gait pattern: step to pattern, decreased arm swing- Left, decreased stride length, and narrow BOS, dec B hip ext throughout gait cycle Distance walked: 10 m Assistive device utilized: Single point cane Level of assistance:  Close CGA, pt reports fear of falls/unsteadiness observed   FUNCTIONAL TESTs:  5 times sit to stand: 28 seconds  Timed up and go (TUG): 32 sec with SPC 10 meter walk test: 0.45 m/s with SPC close CGA due to unsteadiness  Berg Balance Scale: deferred   PATIENT SURVEYS:  FOTO 38 (goal score 47)  TODAY'S TREATMENT:   NMR: BERG 32/56  TherEx: Seated March 3x10 each LE with TC. Rates medium  STS 3x10 with cuing for anterior reach/shift to improve technique and safety  LAQ 2x20 each LE   Standing hip Abd 3x10 each LE, significant cuing VC/TC/Demo provided. Pt continues to have trouble with technique.  Standing heel raises 15x B  Standing toe raises 15x B    PATIENT EDUCATION: Education details: Pt educated throughout session about proper posture and technique with exercises. Improved  exercise technique, movement at target joints, use of target muscles after min to mod verbal, visual, tactile cues.  Person educated: Patient and Child(ren) (adult daughter) Education method: Explanation, Demonstration, Tactile cues, and Verbal cues Education comprehension: verbalized understanding, returned demonstration, verbal cues required, tactile cues required, and needs further education   HOME EXERCISE PROGRAM: No updates today, pt to continue plan as previously indicated Access Code: [email or web address] URL:  [email or web address] Date: 04/03/2022 Prepared by: Ricard Dillon  Exercises - Seated March  - 1 x daily - 5 x weekly - 3 sets - 20 reps - alternating leg hold    GOALS: Goals reviewed with patient? Yes  SHORT TERM GOALS: Target date: 05/24/2022  Patient will be independent in home exercise program to improve strength/mobility for better functional independence with ADLs. Baseline: initiated  Goal status: INITIAL    LONG TERM GOALS: Target date: 07/05/2022  Patient will increase FOTO score to equal to or greater than  47   to demonstrate statistically significant improvement in mobility and quality of life.  Baseline: 38 Goal status: INITIAL  2.  Patient (> 39 years old) will complete five times sit to stand test in < 15 seconds indicating an increased LE strength and improved balance. Baseline: 28 sec use of UE Goal status: INITIAL  3. Patient will increase 10 meter walk test to >1.31ms as to improve gait speed for better community ambulation and to reduce fall risk. Baseline: 0.45 m/s w SPC, close CGA Goal status: INITIAL  4.   Patient will reduce timed up and go to <15 seconds to reduce fall risk and demonstrate improved transfer/gait ability. Baseline: 32 sec with SPC Goal status: INITIAL  5.   Patient will increase Berg Balance score by > 6 points to demonstrate decreased fall risk during functional activities. Baseline: to be completed next 1-2 sessions; 10/18: 32/56 Goal status: INITIAL   ASSESSMENT:  CLINICAL IMPRESSION: Pt completes Berg today, scoring 32/56, indicating increased risk for future falls. Session also focused on LE strengthening. Pt rates interventions as medium to difficult and performs without weights. PT provided education on importance of HEP as well, since pt reports not performing much of her HEP and decreased motivation. The pt will benefit from further skilled PT to address these deficits in order to improve strength, balance,  gait, and mobility and to decrease fall risk.    OBJECTIVE IMPAIRMENTS Abnormal gait, decreased activity tolerance, decreased balance, decreased cognition, decreased endurance, decreased knowledge of use of DME, decreased mobility, difficulty walking, decreased strength, impaired vision/preception, improper body mechanics, postural dysfunction, and pain.   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   REHAB POTENTIAL: Good  CLINICAL DECISION MAKING: Evolving/moderate complexity  EVALUATION COMPLEXITY: Moderate  PLAN: PT FREQUENCY: 2x/week  PT DURATION: 12 weeks  PLANNED INTERVENTIONS: Therapeutic exercises, Therapeutic activity, Neuromuscular re-education, Balance training, Gait training, Patient/Family education, Self Care, Joint mobilization, Stair training, Vestibular training, Canalith repositioning, Orthotic/Fit training, DME instructions, Wheelchair mobility training, Spinal mobilization, Cryotherapy, Moist  heat, scar mobilization, Splintting, Taping, Ultrasound, Parrafin, Biofeedback, Manual therapy, and Re-evaluation  PLAN FOR NEXT SESSION: complete BERG, strength, endurance, balance and gait   Zollie Pee, PT 04/12/2022, 9:36 AM

## 2022-04-17 ENCOUNTER — Ambulatory Visit: Payer: Medicare Other

## 2022-04-17 DIAGNOSIS — R2681 Unsteadiness on feet: Secondary | ICD-10-CM

## 2022-04-17 DIAGNOSIS — R262 Difficulty in walking, not elsewhere classified: Secondary | ICD-10-CM | POA: Diagnosis not present

## 2022-04-17 DIAGNOSIS — R269 Unspecified abnormalities of gait and mobility: Secondary | ICD-10-CM | POA: Diagnosis not present

## 2022-04-17 DIAGNOSIS — M6281 Muscle weakness (generalized): Secondary | ICD-10-CM

## 2022-04-17 NOTE — Therapy (Signed)
OUTPATIENT PHYSICAL THERAPY NEURO TREATMENT NOTE  Patient Name: Sydney Mcdaniel MRN: [id number] DOB:07/24/1924, 88 y.o., female Today's Date: 04/17/2022  PCP: Teodora Medici, DO REFERRING PROVIDER: Teodora Medici, DO    Past Medical History:  Diagnosis Date   Allergy    Anemia    Cardiomyopathy ([place])    a. 02/2019 Echo: EF 60-65%, no rwma, doppler parameters consistent w/ pseudonormalization. Nl RV size/fxn; b. 06/2021 Echo: EF 40-45% w/ sev basal-mid antsept HK. GrII DD. Nl RV size/fxn. Mild MR/AI.   Carotid arterial disease ([place])    a. 04/2020 Carotid U/S: 1-39% bilat ICA stenosis, <50% bilat ECA stenosis; b. 06/2021 Carotid U/S: RICA 6-16%, LICA 07-37%.   Cataract    CLL (chronic lymphocytic leukemia) (HCC)    GERD (gastroesophageal reflux disease)    Glaucoma    H/O: hysterectomy    Total   History of stress test    a. 02/2019 MV: EF 60%, no ischemia/infarct.   Hyperlipidemia    Hypertension    Hypothyroidism    Impaired fasting glucose    Lichen sclerosus    Osteoporosis    Hips   PAF (paroxysmal atrial fibrillation) ([place])    a. 06/15/2016 Event monitor: 4% afib burden; b. CHA2DS2VASc - 4-->Xarelto.   Sinus bradycardia    a. avoid AVN blocking agents.   Syncope    a. 03/2016 Echo: EF 55-60%, no rwma, mild AI/MR, nl PASP; b. 03/2016 48h Holter: no significant arrhythmias/pauses; c. 03/2016 MV: mild apical defect, likely breast attenuation, nl EF, low risk; d. 05/2016 Event monitor: No significant arrhythmia; e. 05/2016 Event monitor: PAF (4%).   Past Surgical History:  Procedure Laterality Date   APPENDECTOMY     EYE SURGERY     Glaucoma   MULTIPLE TOOTH EXTRACTIONS N/A    2 teeth   PACEMAKER IMPLANT N/A 09/22/2021   Procedure: PACEMAKER IMPLANT;  Surgeon: Deboraha Sprang, MD;  Location: [facility];  Service: Cardiovascular;  Laterality: N/A;   TOTAL ABDOMINAL HYSTERECTOMY     Patient Active Problem List   Diagnosis Date Noted   General weakness  02/01/2022   Arm pain, anterior, right 01/30/2022   SIADH (syndrome of inappropriate ADH production) ([place]) 07/05/2021   Aspiration pneumonia ([place]) 05/21/2021   Elevated troponin 05/20/2021   Fall 02/11/2021   Normocytic anemia 05/31/2020   Goals of care, counseling/discussion 03/08/2020   Positive direct antiglobulin test (DAT) 12/04/2019   Sinus bradycardia 04/09/2019   First degree AV block 04/09/2019   Osteoporosis 08/21/2018   Bilateral carotid artery stenosis 04/19/2018   History of syncope 10/17/2017   MCI (mild cognitive impairment) 08/12/2017   Lung nodules 07/09/2017   Paroxysmal atrial fibrillation ([place]) 04/18/2017   Hyponatremia 10/20/2016   Chronic diastolic congestive heart failure ([place]) 06/28/2016   Syncope, near 05/24/2016   Primary open-angle glaucoma, bilateral, mild stage 05/08/2016   Bladder prolapse, female, acquired 02/25/2016   Asymptomatic bacteriuria 02/25/2016   B12 deficiency 01/11/2016   Anemia 01/10/2016   CLL (chronic lymphocytic leukemia) ([place]) 01/10/2016   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 12/21/2015   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 12/21/2015   Bradycardia 12/21/2015   Lymphocytosis 06/25/2015   Impaired fasting glucose 06/22/2015   Carotid atherosclerosis 06/22/2015   Essential hypertension 12/22/2014   Hyperlipidemia 12/22/2014   Hypothyroidism 12/22/2014    ONSET DATE: 01/30/2022  REFERRING DIAG: R26.9 (ICD-10-CM) - Abnormality of gait and mobility  THERAPY DIAG:  Muscle weakness (generalized)  Unsteadiness on  feet  Difficulty in walking, not elsewhere classified  Rationale for Evaluation and Treatment Rehabilitation  SUBJECTIVE:   SUBJECTIVE STATEMENT: Pt reports no pain. No falls. Everything remains the same. Daughter reports performing HEP more frequently. Having trouble with standing hip abduction.   [name]: family member [name]  PERTINENT HISTORY: [name] back in 2022 for  weakness, imbalance. She returns for generalized weakness, balance issues and falls following hospitalization 01/30/22-01/31/22 for acute on chronic hyponatremia, near syncope, RUE pain and generalized weakness. Pt biggest concern is her balance. 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   FALLS: Has patient fallen in last 6 months? Yes. Number of falls 1  LIVING ENVIRONMENT: Lives with: lives with their family and lives alone, but daughter lives next door Lives in: House/apartment Stairs:  pt has a ramp, but one step from back porch into the house (pt grabs onto shelves on R hand side) Has following equipment at home: Single point cane, Walker - 4 wheeled, Wheelchair (manual), shower chair, bed side commode, Grab bars, and Ramped entry  PLOF: Independent  PATIENT GOALS "I want more balance"    OBJECTIVE:   DIAGNOSTIC FINDINGS:   CT head WO contrast (5MM) 8/7/20203 per chart "IMPRESSION: No evidence of acute intracranial abnormality."  COGNITION: Overall cognitive status: History of cognitive impairments - at baseline per pt's daughter, mild cog impairment per chart   SENSATION: Pt daughter reports usually LLE impaired vs. RLE.  Pt able to feel light touch to BLE on exam  COORDINATION: WNL BUE and BLE   EDEMA:  Dtr reports swelling in LLE compared to RLE (reports chronic issue)   POSTURE: rounded shoulders, forward head, and increased  thoracic kyphosis    LOWER EXTREMITY MMT:    Grossly 4+/5 BLE exception hip flexors 4-/5 BLE   TRANSFERS: Assistive device utilized: Single point cane  Sit to stand: Modified independence Stand to sit: Modified independence Chair to chair: Modified independence   GAIT: Gait pattern: step to pattern, decreased arm swing- Left, decreased stride length, and narrow BOS, dec B hip ext throughout gait cycle Distance walked: 10 m Assistive device utilized: Single point cane Level of assistance:  Close CGA, pt reports fear of falls/unsteadiness observed   FUNCTIONAL TESTs:  5 times sit to stand: 28 seconds  Timed up and go (TUG): 32 sec with SPC 10 meter walk test: 0.45 m/s with SPC close CGA due to unsteadiness  Berg Balance Scale: deferred   PATIENT SURVEYS:  FOTO 38 (goal score 47)  TODAY'S TREATMENT:    There.ex:   STS: 3x10. SBA. Min to mod VC's for anterior trunk lean Seated March 3x10/LE  Seated LAQ: 2x12, 2# AW. Min VC's for eccentric control   Standing hip abduction: x12/LE. CGA. Use of SPC as visual cue to perform LE's in frontal plane  Neuro Re-Ed:   Ant forward reach: 9 cones, x3 reps. Forwards, L across body, forwards towards floor with daughter holding cones.  Gait with 1/2 bolster step overs: x3 bolsters, CGA. X4 laps. VC's for step lengths. When stepping over bolster requires SPC in  RUE and HHA on LUE.    Gait with step through pattern 2x20's with improving step lengths. Noted improved stability and gait velocity. Encouraged pt with daughter assist to perform step through gait pattern.    PATIENT EDUCATION: Education details: Pt educated throughout session about proper posture and technique with exercises. Improved exercise technique, movement at target joints, use of target muscles after min to mod verbal, visual, tactile cues.  Person educated: Patient and Child(ren) (adult daughter) Education method: Explanation, Demonstration, Tactile cues, and Verbal  cues Education comprehension: verbalized understanding, returned demonstration, verbal cues required, tactile cues required, and needs further education   HOME EXERCISE PROGRAM: No updates today, pt to continue plan as previously indicated Access Code: [email or web address] URL: [email or web address] Date: 04/03/2022 Prepared by: Ricard Dillon  Exercises - Seated March  - 1 x daily - 5 x weekly - 3 sets - 20 reps - alternating leg hold    GOALS: Goals reviewed with patient? Yes  SHORT TERM GOALS: Target date: 05/29/2022  Patient will be independent in home exercise program to improve strength/mobility for better functional independence with ADLs. Baseline: initiated  Goal status: INITIAL    LONG TERM GOALS: Target date: 07/10/2022  Patient will increase FOTO score to equal to or greater than  47   to demonstrate statistically significant improvement in mobility and quality of life.  Baseline: 38 Goal status: INITIAL  2.  Patient (> 59 years old) will complete five times sit to stand test in < 15 seconds indicating an increased LE strength and improved balance. Baseline: 28 sec use of UE Goal status: INITIAL  3. Patient will increase 10 meter walk test to >1.54ms as to improve gait speed for better community ambulation and to reduce fall risk. Baseline: 0.45 m/s w SPC, close CGA Goal status: INITIAL  4.   Patient will reduce timed up and go to <15 seconds to reduce fall risk and demonstrate improved transfer/gait ability. Baseline: 32 sec with SPC Goal status: INITIAL  5.   Patient will increase Berg Balance score by > 6 points to demonstrate decreased fall risk during functional activities. Baseline: to be completed next 1-2 sessions; 10/18: 32/56 Goal status: INITIAL   ASSESSMENT:  CLINICAL IMPRESSION: Continuing PT POC. Focus of session on improving understanding of HEP. Pt does improve form/technique with PT demo and visual cuing. Notable decrease in step lengths  and step heights without BUE support on supportive surface especially clearing obstacles. Pt does demonstrate in session ability to improve gait mechanics but does require frequent cuing for carryover to step through gait leading to improved gait speed. Pt and daughter encouarged to practice step through gait mechanics at home. Pt will benefit from further skilled PT to address these deficits in order to improve strength, balance, gait, and mobility and to decrease fall risk.   OBJECTIVE IMPAIRMENTS Abnormal gait, decreased activity tolerance, decreased balance, decreased cognition, decreased endurance, decreased knowledge of use of DME, decreased mobility, difficulty walking, decreased strength, impaired vision/preception, improper body mechanics, postural dysfunction, and pain.   ACTIVITY LIMITATIONS squatting, stairs, transfers, and locomotion level  PARTICIPATION LIMITATIONS: meal prep, cleaning, laundry, shopping, community activity, and yard work  PERSONAL FACTORS Age, Fitness, Past/current experiences, Sex, and 3+ comorbidities: Per chart PMH significant for hypertension, hyperlipidemia, chronic lymphocytic leukemia, chronic hyponatremia and SIADH, generalized weakness, hypothyroidism, carotid atherosclerosis, lymphocytosis, neoplasm of uncertain behavior of skin of ear and nose, B12 deficiency, bladder prolapse, glaucoma, Chronic diastolic congestive heart failure, paroxysmal atria fibrillation, lung nodules, mild cognitive  impairment, osteoporosis, first degree AV block, normocytic anemia, falls,   are also affecting patient's functional outcome.   REHAB POTENTIAL: Good  CLINICAL DECISION MAKING: Evolving/moderate complexity  EVALUATION COMPLEXITY: Moderate  PLAN: PT FREQUENCY: 2x/week  PT DURATION: 12 weeks  PLANNED INTERVENTIONS: Therapeutic exercises, Therapeutic activity, Neuromuscular re-education, Balance training, Gait training, Patient/Family education, Self Care, Joint  mobilization, Stair training, Vestibular training, Canalith repositioning, Orthotic/Fit training, DME instructions, Wheelchair mobility training, Spinal mobilization, Cryotherapy, Moist heat, scar mobilization, Splintting, Taping, Ultrasound, Parrafin, Biofeedback, Manual therapy, and Re-evaluation  PLAN FOR NEXT SESSION: Strength, balance, SPC gait training   Salem Caster. Fairly IV, PT, DPT Physical Therapist- [facility]  04/17/2022, 12:03 PM

## 2022-04-19 ENCOUNTER — Ambulatory Visit: Payer: Medicare Other

## 2022-04-19 DIAGNOSIS — M6281 Muscle weakness (generalized): Secondary | ICD-10-CM

## 2022-04-19 DIAGNOSIS — R269 Unspecified abnormalities of gait and mobility: Secondary | ICD-10-CM | POA: Diagnosis not present

## 2022-04-19 DIAGNOSIS — R262 Difficulty in walking, not elsewhere classified: Secondary | ICD-10-CM | POA: Diagnosis not present

## 2022-04-19 DIAGNOSIS — R2681 Unsteadiness on feet: Secondary | ICD-10-CM | POA: Diagnosis not present

## 2022-04-19 NOTE — Therapy (Signed)
OUTPATIENT PHYSICAL THERAPY NEURO TREATMENT NOTE  Patient Name: Sydney Mcdaniel MRN: [id number] DOB:28-Jun-1924, 88 y.o., female Today's Date: 04/19/2022  PCP: Teodora Medici, DO REFERRING PROVIDER: Teodora Medici, DO    Past Medical History:  Diagnosis Date   Allergy    Anemia    Cardiomyopathy ([place])    a. 02/2019 Echo: EF 60-65%, no rwma, doppler parameters consistent w/ pseudonormalization. Nl RV size/fxn; b. 06/2021 Echo: EF 40-45% w/ sev basal-mid antsept HK. GrII DD. Nl RV size/fxn. Mild MR/AI.   Carotid arterial disease ([place])    a. 04/2020 Carotid U/S: 1-39% bilat ICA stenosis, <50% bilat ECA stenosis; b. 06/2021 Carotid U/S: RICA 7-62%, LICA 83-15%.   Cataract    CLL (chronic lymphocytic leukemia) (HCC)    GERD (gastroesophageal reflux disease)    Glaucoma    H/O: hysterectomy    Total   History of stress test    a. 02/2019 MV: EF 60%, no ischemia/infarct.   Hyperlipidemia    Hypertension    Hypothyroidism    Impaired fasting glucose    Lichen sclerosus    Osteoporosis    Hips   PAF (paroxysmal atrial fibrillation) ([place])    a. 06/15/2016 Event monitor: 4% afib burden; b. CHA2DS2VASc - 4-->Xarelto.   Sinus bradycardia    a. avoid AVN blocking agents.   Syncope    a. 03/2016 Echo: EF 55-60%, no rwma, mild AI/MR, nl PASP; b. 03/2016 48h Holter: no significant arrhythmias/pauses; c. 03/2016 MV: mild apical defect, likely breast attenuation, nl EF, low risk; d. 05/2016 Event monitor: No significant arrhythmia; e. 05/2016 Event monitor: PAF (4%).   Past Surgical History:  Procedure Laterality Date   APPENDECTOMY     EYE SURGERY     Glaucoma   MULTIPLE TOOTH EXTRACTIONS N/A    2 teeth   PACEMAKER IMPLANT N/A 09/22/2021   Procedure: PACEMAKER IMPLANT;  Surgeon: Deboraha Sprang, MD;  Location: [facility];  Service: Cardiovascular;  Laterality: N/A;   TOTAL ABDOMINAL HYSTERECTOMY     Patient Active Problem List   Diagnosis Date Noted   General weakness  02/01/2022   Arm pain, anterior, right 01/30/2022   SIADH (syndrome of inappropriate ADH production) ([place]) 07/05/2021   Aspiration pneumonia ([place]) 05/21/2021   Elevated troponin 05/20/2021   Fall 02/11/2021   Normocytic anemia 05/31/2020   Goals of care, counseling/discussion 03/08/2020   Positive direct antiglobulin test (DAT) 12/04/2019   Sinus bradycardia 04/09/2019   First degree AV block 04/09/2019   Osteoporosis 08/21/2018   Bilateral carotid artery stenosis 04/19/2018   History of syncope 10/17/2017   MCI (mild cognitive impairment) 08/12/2017   Lung nodules 07/09/2017   Paroxysmal atrial fibrillation ([place]) 04/18/2017   Hyponatremia 10/20/2016   Chronic diastolic congestive heart failure ([place]) 06/28/2016   Syncope, near 05/24/2016   Primary open-angle glaucoma, bilateral, mild stage 05/08/2016   Bladder prolapse, female, acquired 02/25/2016   Asymptomatic bacteriuria 02/25/2016   B12 deficiency 01/11/2016   Anemia 01/10/2016   CLL (chronic lymphocytic leukemia) ([place]) 01/10/2016   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 12/21/2015   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 12/21/2015   Bradycardia 12/21/2015   Lymphocytosis 06/25/2015   Impaired fasting glucose 06/22/2015   Carotid atherosclerosis 06/22/2015   Essential hypertension 12/22/2014   Hyperlipidemia 12/22/2014   Hypothyroidism 12/22/2014    ONSET DATE: 01/30/2022  REFERRING DIAG: R26.9 (ICD-10-CM) - Abnormality of gait and mobility  THERAPY DIAG:  Muscle weakness (generalized)  Unsteadiness on  feet  Difficulty in walking, not elsewhere classified  Rationale for Evaluation and Treatment Rehabilitation  SUBJECTIVE:   SUBJECTIVE STATEMENT: Pt has been performing HEP at home with help from her daughter. She reports no pain currently, no falls/stumbles.    [name]: family member [name]  PERTINENT HISTORY: [name] back in 2022 for weakness, imbalance. She returns for  generalized weakness, balance issues and falls following hospitalization 01/30/22-01/31/22 for acute on chronic hyponatremia, near syncope, RUE pain and generalized weakness. Pt biggest concern is her balance. 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   FALLS: Has patient fallen in last 6 months? Yes. Number of falls 1  LIVING ENVIRONMENT: Lives with: lives with their family and lives alone, but daughter lives next door Lives in: House/apartment Stairs:  pt has a ramp, but one step from back porch into the house (pt grabs onto shelves on R hand side) Has following equipment at home: Single point cane, Walker - 4 wheeled, Wheelchair (manual), shower chair, bed side commode, Grab bars, and Ramped entry  PLOF: Independent  PATIENT GOALS "I want more balance"    OBJECTIVE:   DIAGNOSTIC FINDINGS:   CT head WO contrast (5MM) 8/7/20203 per chart "IMPRESSION: No evidence of acute intracranial abnormality."  COGNITION: Overall cognitive status: History of cognitive impairments - at baseline per pt's daughter, mild cog impairment per chart   SENSATION: Pt daughter reports usually LLE impaired vs. RLE.  Pt able to feel light touch to BLE on exam  COORDINATION: WNL BUE and BLE   EDEMA:  Dtr reports swelling in LLE compared to RLE (reports chronic issue)   POSTURE: rounded shoulders, forward head, and increased thoracic kyphosis    LOWER EXTREMITY  MMT:    Grossly 4+/5 BLE exception hip flexors 4-/5 BLE   TRANSFERS: Assistive device utilized: Single point cane  Sit to stand: Modified independence Stand to sit: Modified independence Chair to chair: Modified independence   GAIT: Gait pattern: step to pattern, decreased arm swing- Left, decreased stride length, and narrow BOS, dec B hip ext throughout gait cycle Distance walked: 10 m Assistive device utilized: Single point cane Level of assistance:  Close CGA, pt reports fear of falls/unsteadiness observed   FUNCTIONAL TESTs:  5 times sit to stand: 28 seconds  Timed up and go (TUG): 32 sec with SPC 10 meter walk test: 0.45 m/s with SPC close CGA due to unsteadiness  Berg Balance Scale: deferred   PATIENT SURVEYS:  FOTO 38 (goal score 47)  TODAY'S TREATMENT:   There.ex:   STS with cue for anterior reach/ weight shift. Initial 5 attempts without holding ball. Pt then performed 15x, 10x with holding ball to improve technique.   With 2.5# ankle weights donned- Standing hip abd with BUE support 2x15 each LE with external cue (aim for cone cone)  Standing hip ext,BUE support, 2x15 each LE Standing toe taps onto 6" step, BUE support 2x20 alt LE   LAQ with 2.5# AW each LE, 12x each LE with 2 sec holds and ankle in DF   Nustep interval training to promote  LE and cardioresp endurance- performed LVLS 1-3 in 60 to 90 sec intervals, cuing for SPM >60. Pt difficulty achieving SPM over 45. Assist with LE placement throughout and cuing for technique. PT monitored pt for response to intensity of exercise and adjusted as needed throughout.  Standing heel raises, BUE support, 2x15 BLE     PATIENT EDUCATION: Education details: Pt educated throughout session about proper posture and technique with exercises. Improved exercise technique, movement at target joints, use of target muscles after min to mod verbal, visual, tactile cues.  Person educated: Patient and Child(ren) (adult  daughter) Education method: Explanation, Demonstration, Tactile cues, and Verbal cues Education comprehension: verbalized understanding, returned demonstration, verbal cues required, tactile cues required, and needs further education   HOME EXERCISE PROGRAM: No updates today, pt to continue plan as previously indicated Access Code: [email or web address] URL: [email or web address] Date: 04/03/2022 Prepared by: Ricard Dillon  Exercises - Seated March  - 1 x daily - 5 x weekly - 3 sets - 20 reps - alternating leg hold    GOALS: Goals reviewed with patient? Yes  SHORT TERM GOALS: Target date: 05/31/2022  Patient will be independent in home exercise program to improve strength/mobility for better functional independence with ADLs. Baseline: initiated  Goal status: INITIAL    LONG TERM GOALS: Target date: 07/12/2022  Patient will increase FOTO score to equal to or greater than  47   to demonstrate statistically significant improvement in mobility and quality of life.  Baseline: 38 Goal status: INITIAL  2.  Patient (> 30 years old) will complete five times sit to stand test in < 15 seconds indicating an increased LE strength and improved balance. Baseline: 28 sec use of UE Goal status: INITIAL  3. Patient will increase 10 meter walk test to >1.70ms as to improve gait speed for better community ambulation and to reduce fall risk. Baseline: 0.45 m/s w SPC, close CGA Goal status: INITIAL  4.   Patient will reduce timed up and go to <15 seconds to reduce fall risk and demonstrate improved transfer/gait ability. Baseline: 32 sec with SPC Goal status: INITIAL  5.   Patient will increase Berg Balance score by > 6 points to demonstrate decreased fall risk during functional activities. Baseline: to be completed next 1-2 sessions; 10/18: 32/56 Goal status: INITIAL   ASSESSMENT:  CLINICAL IMPRESSION: Pt with excellent motivation to participate in session. She tolerates interventions  well without pain or significant fatigue. Pt able to advance standing and seated therex by using increased resistance. She was most challenged with nustep endurance intervention, requiring brief rest breaks and rated it "hard." The pt will benefit from further skilled PT to address these deficits in order to improve strength, balance, gait, and mobility and to decrease fall risk.   OBJECTIVE IMPAIRMENTS Abnormal gait, decreased activity tolerance, decreased balance, decreased cognition, decreased endurance, decreased knowledge of use of DME, decreased mobility, difficulty walking, decreased strength, impaired vision/preception, improper body mechanics, postural dysfunction, and pain.   ACTIVITY LIMITATIONS squatting, stairs, transfers, and locomotion level  PARTICIPATION LIMITATIONS: meal prep, cleaning, laundry, shopping, community activity, and yard work  PERSONAL FACTORS Age, Fitness, Past/current experiences, Sex, and 3+ comorbidities: Per chart PMH significant for hypertension, hyperlipidemia, chronic lymphocytic leukemia, chronic hyponatremia and SIADH, generalized weakness, hypothyroidism, carotid atherosclerosis, lymphocytosis, neoplasm of uncertain behavior of skin of ear and nose, B12 deficiency, bladder prolapse, glaucoma, Chronic diastolic congestive heart failure, paroxysmal atria fibrillation, lung nodules, mild cognitive impairment, osteoporosis, first degree AV block, normocytic anemia, falls,  are also affecting patient's functional outcome.   REHAB POTENTIAL: Good  CLINICAL DECISION MAKING: Evolving/moderate complexity  EVALUATION COMPLEXITY: Moderate  PLAN: PT FREQUENCY: 2x/week  PT DURATION: 12 weeks  PLANNED INTERVENTIONS: Therapeutic exercises, Therapeutic activity, Neuromuscular re-education, Balance training, Gait training, Patient/Family education, Self Care, Joint mobilization, Stair training, Vestibular training, Canalith repositioning, Orthotic/Fit training, DME  instructions, Wheelchair mobility training, Spinal mobilization, Cryotherapy, Moist heat, scar mobilization, Splintting, Taping, Ultrasound, Parrafin, Biofeedback, Manual therapy, and Re-evaluation  PLAN FOR NEXT SESSION: Strength, balance, SPC gait training, continue plan   Ricard Dillon PT, DPT  Physical Therapist- [facility]  04/19/2022, 11:37 AM

## 2022-04-20 ENCOUNTER — Other Ambulatory Visit: Payer: Self-pay | Admitting: Nurse Practitioner

## 2022-04-20 NOTE — Telephone Encounter (Signed)
Prescription refill request for Xarelto received.  Indication:  afib  Last office visit: Caryl Comes, 12/29/2021 Weight: 62.8 kg  Age: 88 yo  Scr: 0.80, 01/31/2022 CrCl: 43 ml/min   Refill sent.

## 2022-04-20 NOTE — Telephone Encounter (Signed)
Refill request

## 2022-04-24 ENCOUNTER — Ambulatory Visit: Payer: Medicare Other

## 2022-04-24 DIAGNOSIS — R2681 Unsteadiness on feet: Secondary | ICD-10-CM | POA: Diagnosis not present

## 2022-04-24 DIAGNOSIS — R262 Difficulty in walking, not elsewhere classified: Secondary | ICD-10-CM | POA: Diagnosis not present

## 2022-04-24 DIAGNOSIS — R269 Unspecified abnormalities of gait and mobility: Secondary | ICD-10-CM | POA: Diagnosis not present

## 2022-04-24 DIAGNOSIS — M6281 Muscle weakness (generalized): Secondary | ICD-10-CM

## 2022-04-24 NOTE — Therapy (Signed)
OUTPATIENT PHYSICAL THERAPY NEURO TREATMENT NOTE  Patient Name: Sydney Mcdaniel MRN: [id number] DOB:1924-07-26, 88 y.o., female Today's Date: 04/24/2022  PCP: Teodora Medici, DO REFERRING PROVIDER: Teodora Medici, DO    Past Medical History:  Diagnosis Date   Allergy    Anemia    Cardiomyopathy ([place])    a. 02/2019 Echo: EF 60-65%, no rwma, doppler parameters consistent w/ pseudonormalization. Nl RV size/fxn; b. 06/2021 Echo: EF 40-45% w/ sev basal-mid antsept HK. GrII DD. Nl RV size/fxn. Mild MR/AI.   Carotid arterial disease ([place])    a. 04/2020 Carotid U/S: 1-39% bilat ICA stenosis, <50% bilat ECA stenosis; b. 06/2021 Carotid U/S: RICA 4-17%, LICA 40-81%.   Cataract    CLL (chronic lymphocytic leukemia) (HCC)    GERD (gastroesophageal reflux disease)    Glaucoma    H/O: hysterectomy    Total   History of stress test    a. 02/2019 MV: EF 60%, no ischemia/infarct.   Hyperlipidemia    Hypertension    Hypothyroidism    Impaired fasting glucose    Lichen sclerosus    Osteoporosis    Hips   PAF (paroxysmal atrial fibrillation) ([place])    a. 06/15/2016 Event monitor: 4% afib burden; b. CHA2DS2VASc - 4-->Xarelto.   Sinus bradycardia    a. avoid AVN blocking agents.   Syncope    a. 03/2016 Echo: EF 55-60%, no rwma, mild AI/MR, nl PASP; b. 03/2016 48h Holter: no significant arrhythmias/pauses; c. 03/2016 MV: mild apical defect, likely breast attenuation, nl EF, low risk; d. 05/2016 Event monitor: No significant arrhythmia; e. 05/2016 Event monitor: PAF (4%).   Past Surgical History:  Procedure Laterality Date   APPENDECTOMY     EYE SURGERY     Glaucoma   MULTIPLE TOOTH EXTRACTIONS N/A    2 teeth   PACEMAKER IMPLANT N/A 09/22/2021   Procedure: PACEMAKER IMPLANT;  Surgeon: Deboraha Sprang, MD;  Location: [facility];  Service: Cardiovascular;  Laterality: N/A;   TOTAL ABDOMINAL HYSTERECTOMY     Patient Active Problem List   Diagnosis Date Noted   General weakness  02/01/2022   Arm pain, anterior, right 01/30/2022   SIADH (syndrome of inappropriate ADH production) ([place]) 07/05/2021   Aspiration pneumonia ([place]) 05/21/2021   Elevated troponin 05/20/2021   Fall 02/11/2021   Normocytic anemia 05/31/2020   Goals of care, counseling/discussion 03/08/2020   Positive direct antiglobulin test (DAT) 12/04/2019   Sinus bradycardia 04/09/2019   First degree AV block 04/09/2019   Osteoporosis 08/21/2018   Bilateral carotid artery stenosis 04/19/2018   History of syncope 10/17/2017   MCI (mild cognitive impairment) 08/12/2017   Lung nodules 07/09/2017   Paroxysmal atrial fibrillation ([place]) 04/18/2017   Hyponatremia 10/20/2016   Chronic diastolic congestive heart failure ([place]) 06/28/2016   Syncope, near 05/24/2016   Primary open-angle glaucoma, bilateral, mild stage 05/08/2016   Bladder prolapse, female, acquired 02/25/2016   Asymptomatic bacteriuria 02/25/2016   B12 deficiency 01/11/2016   Anemia 01/10/2016   CLL (chronic lymphocytic leukemia) ([place]) 01/10/2016   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 12/21/2015   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 12/21/2015   Bradycardia 12/21/2015   Lymphocytosis 06/25/2015   Impaired fasting glucose 06/22/2015   Carotid atherosclerosis 06/22/2015   Essential hypertension 12/22/2014   Hyperlipidemia 12/22/2014   Hypothyroidism 12/22/2014    ONSET DATE: 01/30/2022  REFERRING DIAG: R26.9 (ICD-10-CM) - Abnormality of gait and mobility  THERAPY DIAG:  Muscle weakness (generalized)  Difficulty in  walking, not elsewhere classified  Unsteadiness on feet  Rationale for Evaluation and Treatment Rehabilitation  SUBJECTIVE:   SUBJECTIVE STATEMENT: Pt daughter reports pt helped decorate for Halloween, had a high activity day. Pt reports no aches/pains currently, and no stumbles/falls. No other concerns/updates reported.  [name]: family member [name]  PERTINENT HISTORY: [name]  back in 2022 for weakness, imbalance. She returns for generalized weakness, balance issues and falls following hospitalization 01/30/22-01/31/22 for acute on chronic hyponatremia, near syncope, RUE pain and generalized weakness. Pt biggest concern is her balance. 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   FALLS: Has patient fallen in last 6 months? Yes. Number of falls 1  LIVING ENVIRONMENT: Lives with: lives with their family and lives alone, but daughter lives next door Lives in: House/apartment Stairs:  pt has a ramp, but one step from back porch into the house (pt grabs onto shelves on R hand side) Has following equipment at home: Single point cane, Walker - 4 wheeled, Wheelchair (manual), shower chair, bed side commode, Grab bars, and Ramped entry  PLOF: Independent  PATIENT GOALS "I want more balance"    OBJECTIVE:   DIAGNOSTIC FINDINGS:   CT head WO contrast (5MM) 8/7/20203 per chart "IMPRESSION: No evidence of acute intracranial abnormality."  COGNITION: Overall cognitive status: History of cognitive impairments - at baseline per pt's daughter, mild cog impairment per chart   SENSATION: Pt daughter reports usually LLE impaired vs. RLE.  Pt able to feel light touch to BLE on exam  COORDINATION: WNL BUE and BLE   EDEMA:  Dtr reports swelling in LLE compared to RLE (reports chronic issue)   POSTURE: rounded shoulders, forward head, and  increased thoracic kyphosis    LOWER EXTREMITY MMT:    Grossly 4+/5 BLE exception hip flexors 4-/5 BLE   TRANSFERS: Assistive device utilized: Single point cane  Sit to stand: Modified independence Stand to sit: Modified independence Chair to chair: Modified independence   GAIT: Gait pattern: step to pattern, decreased arm swing- Left, decreased stride length, and narrow BOS, dec B hip ext throughout gait cycle Distance walked: 10 m Assistive device utilized: Single point cane Level of assistance:  Close CGA, pt reports fear of falls/unsteadiness observed   FUNCTIONAL TESTs:  5 times sit to stand: 28 seconds  Timed up and go (TUG): 32 sec with SPC 10 meter walk test: 0.45 m/s with SPC close CGA due to unsteadiness  Berg Balance Scale: deferred   PATIENT SURVEYS:  FOTO 38 (goal score 47)  TODAY'S TREATMENT:  Gait belt donned and CGA provided unless specified otherwise There.ex:   3# ankle weights donned to each LE for the following: Step-tap onto 6" then 12" step x multiple reps each LE, BUE support on handrails Standing hip abd 20x each LE Standing hip ext 20x each LE 1x148 ft amb for endurance SPC and close CGA, fatigues quickly, requests rest break  STS 2x10 hands-free, continues to require initial cuing for technique  NMR: Several minutes of the following Static stand on airex in WBOS EO completed at first without dual  cognitive task, then with dual cog task Static stand on airex NBOS EO, with and without dual cognitive task Static stand on airex, WBOS horizontal head turns 10x each direction Static stand on airex, NBOS vertical head turns 10x each direction Static stand on airex, WBOS EC Static stand on airex, NBOS EC Close CGA and intermittent UE support throughout. Increased difficulty with dual task.    PATIENT EDUCATION: Education details: Pt educated throughout session about proper posture and technique with exercises. Improved exercise technique,  movement at target joints, use of target muscles after min to mod verbal, visual, tactile cues.  Person educated: Patient and Child(ren) (adult daughter) Education method: Explanation, Demonstration, Tactile cues, and Verbal cues Education comprehension: verbalized understanding, returned demonstration, verbal cues required, tactile cues required, and needs further education   HOME EXERCISE PROGRAM: No updates today, pt to continue plan as previously indicated Access Code: [email or web address] URL: [email or web address] Date: 04/03/2022 Prepared by: Ricard Dillon  Exercises - Seated March  - 1 x daily - 5 x weekly - 3 sets - 20 reps - alternating leg hold    GOALS: Goals reviewed with patient? Yes  SHORT TERM GOALS: Target date: 06/05/2022  Patient will be independent in home exercise program to improve strength/mobility for better functional independence with ADLs. Baseline: initiated  Goal status: INITIAL    LONG TERM GOALS: Target date: 07/17/2022  Patient will increase FOTO score to equal to or greater than  47   to demonstrate statistically significant improvement in mobility and quality of life.  Baseline: 38 Goal status: INITIAL  2.  Patient (> 15 years old) will complete five times sit to stand test in < 15 seconds indicating an increased LE strength and improved balance. Baseline: 28 sec use of UE Goal status: INITIAL  3. Patient will increase 10 meter walk test to >1.42ms as to improve gait speed for better community ambulation and to reduce fall risk. Baseline: 0.45 m/s w SPC, close CGA Goal status: INITIAL  4.   Patient will reduce timed up and go to <15 seconds to reduce fall risk and demonstrate improved transfer/gait ability. Baseline: 32 sec with SPC Goal status: INITIAL  5.   Patient will increase Berg Balance score by > 6 points to demonstrate decreased fall risk during functional activities. Baseline: to be completed next 1-2 sessions; 10/18:  32/56 Goal status: INITIAL   ASSESSMENT:  CLINICAL IMPRESSION: Pt able to advance level of resistance and volume of reps performed with therex, indicating increased BLE strength. Pt did attempt ambulation with ankle weights to promote endurance and LE strength, however, fatigued quickly with first lap and required a rest break. Pt generally required close CGA and intermittent UE support for all compliant surface balance training today. The pt will benefit from further skilled PT to address these deficits in order to improve strength, balance, gait, and mobility and to decrease fall risk.   OBJECTIVE IMPAIRMENTS Abnormal gait, decreased activity tolerance, decreased balance, decreased cognition, decreased endurance, decreased knowledge of use of DME, decreased mobility, difficulty walking, decreased strength, impaired vision/preception, improper body mechanics, postural dysfunction, and pain.   ACTIVITY LIMITATIONS squatting, stairs, transfers, and locomotion level  PARTICIPATION LIMITATIONS: meal prep, cleaning, laundry, shopping, community activity, and yard work  PERSONAL FACTORS Age, Fitness, Past/current experiences, Sex, and 3+ comorbidities: Per chart PMH significant for hypertension, hyperlipidemia, chronic lymphocytic leukemia, chronic hyponatremia and SIADH, generalized weakness, hypothyroidism, carotid atherosclerosis, lymphocytosis, neoplasm of uncertain behavior of skin of ear and nose, B12 deficiency, bladder prolapse,  glaucoma, Chronic diastolic congestive heart failure, paroxysmal atria fibrillation, lung nodules, mild cognitive impairment, osteoporosis, first degree AV block, normocytic anemia, falls,   are also affecting patient's functional outcome.   REHAB POTENTIAL: Good  CLINICAL DECISION MAKING: Evolving/moderate complexity  EVALUATION COMPLEXITY: Moderate  PLAN: PT FREQUENCY: 2x/week  PT DURATION: 12 weeks  PLANNED INTERVENTIONS: Therapeutic exercises, Therapeutic  activity, Neuromuscular re-education, Balance training, Gait training, Patient/Family education, Self Care, Joint mobilization, Stair training, Vestibular training, Canalith repositioning, Orthotic/Fit training, DME instructions, Wheelchair mobility training, Spinal mobilization, Cryotherapy, Moist heat, scar mobilization, Splintting, Taping, Ultrasound, Parrafin, Biofeedback, Manual therapy, and Re-evaluation  PLAN FOR NEXT SESSION: Strength, balance, SPC gait training, continue plan   Ricard Dillon PT, DPT  Physical Therapist- [facility]  04/24/2022, 11:42 AM

## 2022-04-26 ENCOUNTER — Encounter: Payer: Self-pay | Admitting: Physical Therapy

## 2022-04-26 ENCOUNTER — Ambulatory Visit: Payer: Medicare Other | Attending: Internal Medicine | Admitting: Physical Therapy

## 2022-04-26 DIAGNOSIS — R1312 Dysphagia, oropharyngeal phase: Secondary | ICD-10-CM | POA: Insufficient documentation

## 2022-04-26 DIAGNOSIS — R2681 Unsteadiness on feet: Secondary | ICD-10-CM | POA: Insufficient documentation

## 2022-04-26 DIAGNOSIS — R262 Difficulty in walking, not elsewhere classified: Secondary | ICD-10-CM | POA: Insufficient documentation

## 2022-04-26 DIAGNOSIS — M6281 Muscle weakness (generalized): Secondary | ICD-10-CM | POA: Diagnosis not present

## 2022-04-26 NOTE — Therapy (Signed)
OUTPATIENT PHYSICAL THERAPY NEURO TREATMENT NOTE  Patient Name: Sydney Mcdaniel MRN: [id number] DOB:1925-01-22, 88 y.o., female Today's Date: 04/26/2022  PCP: Teodora Medici, DO REFERRING PROVIDER: Teodora Medici, DO    PT End of Session - 04/26/22 1109     Visit Number 6    Number of Visits 25    Date for PT Re-Evaluation 06/26/22    Authorization Time Period 04/03/22-06/26/2022    Progress Note Due on Visit 10    PT Start Time 0804    PT Stop Time 0844    PT Time Calculation (min) 40 min    Equipment Utilized During Treatment Gait belt    Activity Tolerance Patient tolerated treatment well    Behavior During Therapy [facility] for tasks assessed/performed              Past Medical History:  Diagnosis Date   Allergy    Anemia    Cardiomyopathy ([place])    a. 02/2019 Echo: EF 60-65%, no rwma, doppler parameters consistent w/ pseudonormalization. Nl RV size/fxn; b. 06/2021 Echo: EF 40-45% w/ sev basal-mid antsept HK. GrII DD. Nl RV size/fxn. Mild MR/AI.   Carotid arterial disease ([place])    a. 04/2020 Carotid U/S: 1-39% bilat ICA stenosis, <50% bilat ECA stenosis; b. 06/2021 Carotid U/S: RICA 7-40%, LICA 81-44%.   Cataract    CLL (chronic lymphocytic leukemia) (HCC)    GERD (gastroesophageal reflux disease)    Glaucoma    H/O: hysterectomy    Total   History of stress test    a. 02/2019 MV: EF 60%, no ischemia/infarct.   Hyperlipidemia    Hypertension    Hypothyroidism    Impaired fasting glucose    Lichen sclerosus    Osteoporosis    Hips   PAF (paroxysmal atrial fibrillation) ([place])    a. 06/15/2016 Event monitor: 4% afib burden; b. CHA2DS2VASc - 4-->Xarelto.   Sinus bradycardia    a. avoid AVN blocking agents.   Syncope    a. 03/2016 Echo: EF 55-60%, no rwma, mild AI/MR, nl PASP; b. 03/2016 48h Holter: no significant arrhythmias/pauses; c. 03/2016 MV: mild apical defect, likely breast attenuation, nl EF, low risk; d. 05/2016 Event monitor: No significant arrhythmia;  e. 05/2016 Event monitor: PAF (4%).   Past Surgical History:  Procedure Laterality Date   APPENDECTOMY     EYE SURGERY     Glaucoma   MULTIPLE TOOTH EXTRACTIONS N/A    2 teeth   PACEMAKER IMPLANT N/A 09/22/2021   Procedure: PACEMAKER IMPLANT;  Surgeon: Deboraha Sprang, MD;  Location: [place] [facility];  Service: Cardiovascular;  Laterality: N/A;   TOTAL ABDOMINAL HYSTERECTOMY     Patient Active Problem List   Diagnosis Date Noted   General weakness 02/01/2022   Arm pain, anterior, right 01/30/2022   SIADH (syndrome of inappropriate ADH production) ([place]) 07/05/2021   Aspiration pneumonia ([place]) 05/21/2021   Elevated troponin 05/20/2021   Fall 02/11/2021   Normocytic anemia 05/31/2020   Goals of care, counseling/discussion 03/08/2020   Positive direct antiglobulin test (DAT) 12/04/2019   Sinus bradycardia 04/09/2019   First degree AV block 04/09/2019   Osteoporosis 08/21/2018   Bilateral carotid artery stenosis 04/19/2018   History of syncope 10/17/2017   MCI (mild cognitive impairment) 08/12/2017   Lung nodules 07/09/2017   Paroxysmal atrial fibrillation ([place]) 04/18/2017   Hyponatremia 10/20/2016   Chronic diastolic congestive heart failure ([place]) 06/28/2016   Syncope, near 05/24/2016   Primary open-angle glaucoma, bilateral, mild stage 05/08/2016  Bladder prolapse, female, acquired 02/25/2016   Asymptomatic bacteriuria 02/25/2016   B12 deficiency 01/11/2016   Anemia 01/10/2016   CLL (chronic lymphocytic leukemia) ([place]) 01/10/2016   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 12/21/2015   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 12/21/2015   Bradycardia 12/21/2015   Lymphocytosis 06/25/2015   Impaired fasting glucose 06/22/2015   Carotid atherosclerosis 06/22/2015   Essential hypertension 12/22/2014   Hyperlipidemia 12/22/2014   Hypothyroidism 12/22/2014    ONSET DATE: 01/30/2022  REFERRING DIAG: R26.9 (ICD-10-CM) - Abnormality of gait and mobility  THERAPY  DIAG:  Muscle weakness (generalized)  Difficulty in walking, not elsewhere classified  Unsteadiness on feet  Rationale for Evaluation and Treatment Rehabilitation  SUBJECTIVE:   SUBJECTIVE STATEMENT: Pt reports no significant changes since last session. Pt is very eager to complete exercises properly.   [name]: family member [name]  PERTINENT HISTORY: [name] back in 2022 for weakness, imbalance. She returns for generalized weakness, balance issues and falls following hospitalization 01/30/22-01/31/22 for acute on chronic hyponatremia, near syncope, RUE pain and generalized weakness. Pt biggest concern is her balance. 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   FALLS: Has patient fallen in last 6 months? Yes. Number of falls 1  LIVING ENVIRONMENT: Lives with: lives with their family and lives alone, but daughter lives next door Lives in: House/apartment Stairs:  pt has a ramp, but one step from back porch into the house (pt grabs onto shelves on R hand side) Has following equipment at home: Single point cane, Walker - 4 wheeled, Wheelchair (manual), shower chair, bed side commode, Grab bars, and Ramped entry  PLOF: Independent  PATIENT GOALS "I want more balance"    OBJECTIVE:   DIAGNOSTIC FINDINGS:   CT head WO contrast (5MM) 8/7/20203 per chart "IMPRESSION: No evidence of acute intracranial  abnormality."  COGNITION: Overall cognitive status: History of cognitive impairments - at baseline per pt's daughter, mild cog impairment per chart   SENSATION: Pt daughter reports usually LLE impaired vs. RLE.  Pt able to feel light touch to BLE on exam  COORDINATION: WNL BUE and BLE   EDEMA:  Dtr reports swelling in LLE compared to RLE (reports chronic issue)   POSTURE: rounded shoulders, forward head, and increased thoracic kyphosis    LOWER EXTREMITY MMT:    Grossly 4+/5 BLE exception hip flexors 4-/5 BLE   TRANSFERS: Assistive device utilized: Single point cane  Sit to stand: Modified independence Stand to sit: Modified independence Chair to chair: Modified independence   GAIT: Gait pattern: step to pattern, decreased arm swing- Left, decreased stride length, and narrow BOS, dec B hip ext throughout gait cycle Distance walked: 10 m Assistive device utilized: Single point cane Level of assistance:  Close CGA, pt reports fear of falls/unsteadiness observed   FUNCTIONAL TESTs:  5 times sit to stand: 28 seconds  Timed up and go (TUG): 32 sec with SPC 10 meter walk test: 0.45 m/s with SPC close CGA due to unsteadiness  Berg Balance Scale: deferred   PATIENT SURVEYS:  FOTO 38 (  goal score 47)  TODAY'S TREATMENT:  Gait belt donned and CGA provided unless specified otherwise There.ex:   3# ankle weights donned to each LE for the following: Standing march x 10 ea LE Standing hip abd 20x each LE, use cane for external cue for proper muscle activation Standing hamstring curl x 10 ea LE, cues for proper muscle activation 1x155 ft amb for endurance SPC and close CGA, fatigues quickly, difficulty with turns  STS x10 hands-free, continues to require initial cuing for technique  Ambulation turn training box with corners approx 8 feet away working on 90 degree turns, 2 laps clock and counterclockwise.   NMR: Several minutes of the following Static stand on airex in  WBOS EO completed at first without dual cognitive task, then with dual cog task Static stand on airex NBOS EO, with and without dual cognitive task Static stand on airex, WBOS horizontal head turns  35 seconds   Static stand on airex, NBOS vertical head turns x 35 seconds  Close CGA and intermittent UE support throughout.      PATIENT EDUCATION: Education details: Pt educated throughout session about proper posture and technique with exercises. Improved exercise technique, movement at target joints, use of target muscles after min to mod verbal, visual, tactile cues.  Person educated: Patient and Child(ren) (adult daughter) Education method: Explanation, Demonstration, Tactile cues, and Verbal cues Education comprehension: verbalized understanding, returned demonstration, verbal cues required, tactile cues required, and needs further education   HOME EXERCISE PROGRAM: No updates today, pt to continue plan as previously indicated Access Code: [email or web address] URL: [email or web address] Date: 04/03/2022 Prepared by: Ricard Dillon  Exercises - Seated March  - 1 x daily - 5 x weekly - 3 sets - 20 reps - alternating leg hold    GOALS: Goals reviewed with patient? Yes  SHORT TERM GOALS: Target date: 06/07/2022  Patient will be independent in home exercise program to improve strength/mobility for better functional independence with ADLs. Baseline: initiated  Goal status: INITIAL    LONG TERM GOALS: Target date: 07/19/2022  Patient will increase FOTO score to equal to or greater than  47   to demonstrate statistically significant improvement in mobility and quality of life.  Baseline: 38 Goal status: INITIAL  2.  Patient (> 64 years old) will complete five times sit to stand test in < 15 seconds indicating an increased LE strength and improved balance. Baseline: 28 sec use of UE Goal status: INITIAL  3. Patient will increase 10 meter walk test to >1.32ms as to improve  gait speed for better community ambulation and to reduce fall risk. Baseline: 0.45 m/s w SPC, close CGA Goal status: INITIAL  4.   Patient will reduce timed up and go to <15 seconds to reduce fall risk and demonstrate improved transfer/gait ability. Baseline: 32 sec with SPC Goal status: INITIAL  5.   Patient will increase Berg Balance score by > 6 points to demonstrate decreased fall risk during functional activities. Baseline: to be completed next 1-2 sessions; 10/18: 32/56 Goal status: INITIAL   ASSESSMENT:  CLINICAL IMPRESSION: Continued with current plan of care as laid out in evaluation and recent prior sessions. Pt remains motivated to advance progress toward goals in order to maximize independence and safety at home. Pt requires high level assistance and cuing for completion of exercises in order to provide adequate level of stimulation and perturbation. Author allows pt as much opportunity as possible to perform independent righting strategies, only stepping in when pt is  unable to prevent falling to floor.Pt still lacks confidence with various activities particularly with turning in tight spaces and attempts to reach for objects for stability. Will continue to improve this in future sessions. Pt continues to demonstrate progress toward goals AEB progression of some interventions this date either in volume or intensity.     OBJECTIVE IMPAIRMENTS Abnormal gait, decreased activity tolerance, decreased balance, decreased cognition, decreased endurance, decreased knowledge of use of DME, decreased mobility, difficulty walking, decreased strength, impaired vision/preception, improper body mechanics, postural dysfunction, and pain.   ACTIVITY LIMITATIONS squatting, stairs, transfers, and locomotion level  PARTICIPATION LIMITATIONS: meal prep, cleaning, laundry, shopping, community activity, and yard work  PERSONAL FACTORS Age, Fitness, Past/current experiences, Sex, and 3+  comorbidities: Per chart PMH significant for hypertension, hyperlipidemia, chronic lymphocytic leukemia, chronic hyponatremia and SIADH, generalized weakness, hypothyroidism, carotid atherosclerosis, lymphocytosis, neoplasm of uncertain behavior of skin of ear and nose, B12 deficiency, bladder prolapse, glaucoma, Chronic diastolic congestive heart failure, paroxysmal atria fibrillation, lung nodules, mild cognitive impairment, osteoporosis, first degree AV block, normocytic anemia, falls,   are also affecting patient's functional outcome.   REHAB POTENTIAL: Good  CLINICAL DECISION MAKING: Evolving/moderate complexity  EVALUATION COMPLEXITY: Moderate  PLAN: PT FREQUENCY: 2x/week  PT DURATION: 12 weeks  PLANNED INTERVENTIONS: Therapeutic exercises, Therapeutic activity, Neuromuscular re-education, Balance training, Gait training, Patient/Family education, Self Care, Joint mobilization, Stair training, Vestibular training, Canalith repositioning, Orthotic/Fit training, DME instructions, Wheelchair mobility training, Spinal mobilization, Cryotherapy, Moist heat, scar mobilization, Splintting, Taping, Ultrasound, Parrafin, Biofeedback, Manual therapy, and Re-evaluation  PLAN FOR NEXT SESSION: Strength, balance, SPC gait training, continue plan   Particia Lather PT   Physical Therapist- [facility]  04/26/2022, 11:10 AM

## 2022-05-01 DIAGNOSIS — E871 Hypo-osmolality and hyponatremia: Secondary | ICD-10-CM | POA: Diagnosis not present

## 2022-05-03 ENCOUNTER — Ambulatory Visit: Payer: Medicare Other

## 2022-05-03 DIAGNOSIS — M6281 Muscle weakness (generalized): Secondary | ICD-10-CM

## 2022-05-03 DIAGNOSIS — R2681 Unsteadiness on feet: Secondary | ICD-10-CM

## 2022-05-03 DIAGNOSIS — R262 Difficulty in walking, not elsewhere classified: Secondary | ICD-10-CM

## 2022-05-03 NOTE — Therapy (Signed)
OUTPATIENT PHYSICAL THERAPY   Patient Name: Sydney Mcdaniel MRN: [id number] DOB:1924/07/16, 88 y.o., female Today's Date: 05/03/2022  PCP: Teodora Medici, DO REFERRING PROVIDER: Teodora Medici, DO   Session canceled today. Pt arrived to clinic with DTR. Vitals assessment show elevated BP beyond our clinics safety parameters. Slight BP drop sitting to standing, which improves after 1 minutes and is elevated even moreso. DTR educated on midodrine medication, cautioned taking additional dosage this afternoon, encouraged to contact cardiology at their request.   No charge.        PT End of Session - 05/03/22 0940     Visit Number 0    Number of Visits --    Date for PT Re-Evaluation --    Authorization Type Medicare    Authorization Time Period 04/03/22-06/26/2022    Progress Note Due on Visit --    PT Start Time 0935    PT Stop Time 1013    PT Time Calculation (min) 38 min    Equipment Utilized During Treatment --    Activity Tolerance Patient tolerated treatment well;No increased pain    Behavior During Therapy WFL for tasks assessed/performed              Past Medical History:  Diagnosis Date   Allergy    Anemia    Cardiomyopathy ([place])    a. 02/2019 Echo: EF 60-65%, no rwma, doppler parameters consistent w/ pseudonormalization. Nl RV size/fxn; b. 06/2021 Echo: EF 40-45% w/ sev basal-mid antsept HK. GrII DD. Nl RV size/fxn. Mild MR/AI.   Carotid arterial disease ([place])    a. 04/2020 Carotid U/S: 1-39% bilat ICA stenosis, <50% bilat ECA stenosis; b. 06/2021 Carotid U/S: RICA 0-03%, LICA 70-48%.   Cataract    CLL (chronic lymphocytic leukemia) (HCC)    GERD (gastroesophageal reflux disease)    Glaucoma    H/O: hysterectomy    Total   History of stress test    a. 02/2019 MV: EF 60%, no ischemia/infarct.   Hyperlipidemia    Hypertension    Hypothyroidism    Impaired fasting glucose    Lichen sclerosus    Osteoporosis    Hips   PAF (paroxysmal atrial  fibrillation) ([place])    a. 06/15/2016 Event monitor: 4% afib burden; b. CHA2DS2VASc - 4-->Xarelto.   Sinus bradycardia    a. avoid AVN blocking agents.   Syncope    a. 03/2016 Echo: EF 55-60%, no rwma, mild AI/MR, nl PASP; b. 03/2016 48h Holter: no significant arrhythmias/pauses; c. 03/2016 MV: mild apical defect, likely breast attenuation, nl EF, low risk; d. 05/2016 Event monitor: No significant arrhythmia; e. 05/2016 Event monitor: PAF (4%).   Past Surgical History:  Procedure Laterality Date   APPENDECTOMY     EYE SURGERY     Glaucoma   MULTIPLE TOOTH EXTRACTIONS N/A    2 teeth   PACEMAKER IMPLANT N/A 09/22/2021   Procedure: PACEMAKER IMPLANT;  Surgeon: Deboraha Sprang, MD;  Location: [place] [facility];  Service: Cardiovascular;  Laterality: N/A;   TOTAL ABDOMINAL HYSTERECTOMY     Patient Active Problem List   Diagnosis Date Noted   General weakness 02/01/2022   Arm pain, anterior, right 01/30/2022   SIADH (syndrome of inappropriate ADH production) ([place]) 07/05/2021   Aspiration pneumonia ([place]) 05/21/2021   Elevated troponin 05/20/2021   Fall 02/11/2021   Normocytic anemia 05/31/2020   Goals of care, counseling/discussion 03/08/2020   Positive direct antiglobulin test (DAT) 12/04/2019   Sinus bradycardia 04/09/2019   First  degree AV block 04/09/2019   Osteoporosis 08/21/2018   Bilateral carotid artery stenosis 04/19/2018   History of syncope 10/17/2017   MCI (mild cognitive impairment) 08/12/2017   Lung nodules 07/09/2017   Paroxysmal atrial fibrillation ([place]) 04/18/2017   Hyponatremia 10/20/2016   Chronic diastolic congestive heart failure ([place]) 06/28/2016   Syncope, near 05/24/2016   Primary open-angle glaucoma, bilateral, mild stage 05/08/2016   Bladder prolapse, female, acquired 02/25/2016   Asymptomatic bacteriuria 02/25/2016   B12 deficiency 01/11/2016   Anemia 01/10/2016   CLL (chronic lymphocytic leukemia) ([place]) 01/10/2016   Neoplasm of uncertain behavior of  skin of ear 12/21/2015   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 12/21/2015   Bradycardia 12/21/2015   Lymphocytosis 06/25/2015   Impaired fasting glucose 06/22/2015   Carotid atherosclerosis 06/22/2015   Essential hypertension 12/22/2014   Hyperlipidemia 12/22/2014   Hypothyroidism 12/22/2014    ONSET DATE: 01/30/2022  REFERRING DIAG: R26.9 (ICD-10-CM) - Abnormality of gait and mobility  THERAPY DIAG:  Muscle weakness (generalized)  Difficulty in walking, not elsewhere classified  Unsteadiness on feet  Rationale for Evaluation and Treatment Rehabilitation  SUBJECTIVE:   SUBJECTIVE STATEMENT: Pt and DTR reports no big updates. Pt was feeling not so good earlier in week, DTR instinctively had pt's Na+ checked due to history of similar presentation in the setting of hyponatremia- Na+ WNL. Pt gradually feeling improved.    [name]: family member [name]  PERTINENT HISTORY: [name] back in 2022 for weakness, imbalance. She returns for generalized weakness, balance issues and falls following hospitalization 01/30/22-01/31/22 for acute on chronic hyponatremia, near syncope, RUE pain and generalized weakness. Pt biggest concern is her balance. Per chart PMH significant for pacemaker implant (09/22/2021) hypertension, hyperlipidemia, chronic lymphocytic leukemia, chronic hyponatremia and SIADH, generalized weakness, hypothyroidism, carotid atherosclerosis, lymphocytosis, neoplasm of uncertain behavior of skin of ear and nose, B12 deficiency, bladder prolapse, glaucoma, Chronic diastolic congestive heart failure, paroxysmal atria fibrillation, lung nodules, mild cognitive impairment, osteoporosis, first degree AV block, normocytic anemia, falls,   PRECAUTIONS: Fall  PAIN:  Are you having pain?   FALLS: Has patient fallen in last 6 months? Yes. Number of falls 1  PATIENT GOALS "I want more balance"    Vitals assessed today: Sitting BP: 175/79 mmHg, 67 bpm  Standing @  0": 160/77 mmHg, 76 bpm Standing @ 1": 204/84 mmHg, 80 bpm  *per pt and DTR all medications taken this morning, including midodrine. Pt reports elevated pressure previous day.  **DTR and pt unaware that midodrine was prescribed for hypotension, believed it to be prescribed for HTN.   12:48 PM, 05/03/22 Etta Grandchild, PT, DPT Physical Therapist - [facility]  Outpatient Physical Therapy- [place] PT Physical Therapist- [facility]  05/03/2022, 9:48 AM

## 2022-05-04 ENCOUNTER — Ambulatory Visit: Payer: Medicare Other | Attending: Internal Medicine | Admitting: Internal Medicine

## 2022-05-04 ENCOUNTER — Encounter: Payer: Self-pay | Admitting: Internal Medicine

## 2022-05-04 VITALS — BP 190/94 | HR 78 | Ht 62.0 in | Wt 135.0 lb

## 2022-05-04 DIAGNOSIS — I480 Paroxysmal atrial fibrillation: Secondary | ICD-10-CM | POA: Diagnosis not present

## 2022-05-04 DIAGNOSIS — I10 Essential (primary) hypertension: Secondary | ICD-10-CM | POA: Diagnosis not present

## 2022-05-04 DIAGNOSIS — I5020 Unspecified systolic (congestive) heart failure: Secondary | ICD-10-CM | POA: Insufficient documentation

## 2022-05-04 DIAGNOSIS — R42 Dizziness and giddiness: Secondary | ICD-10-CM | POA: Diagnosis not present

## 2022-05-04 DIAGNOSIS — I495 Sick sinus syndrome: Secondary | ICD-10-CM | POA: Diagnosis not present

## 2022-05-04 NOTE — Patient Instructions (Signed)
Medication Instructions:  STOP: Midodrine 2.5 mg daily  *If you need a refill on your cardiac medications before your next appointment, please call your pharmacy*   Lab Work: None ordered today   Testing/Procedures: None ordered today   Follow-Up: At [facility], you and your health needs are our priority.  As part of our continuing mission to provide you with exceptional heart care, we have created designated Provider Care Teams.  These Care Teams include your primary Cardiologist (physician) and Advanced Practice Providers (APPs -  Physician Assistants and Nurse Practitioners) who all work together to provide you with the care you need, when you need it.  We recommend signing up for the patient portal called "MyChart".  Sign up information is provided on this After Visit Summary.  MyChart is used to connect with patients for Virtual Visits (Telemedicine).  Patients are able to view lab/test results, encounter notes, upcoming appointments, etc.  Non-urgent messages can be sent to your provider as well.   To learn more about what you can do with MyChart, go to [email or web address].    Your next appointment:   1 month(s)  The format for your next appointment:   In Person  Provider:   You will see one of the following Advanced Practice Providers on your designated Care Team:   [name], NP Christell Faith, PA-C Cadence Kathlen Mody, PA-C Gerrie Nordmann, NP

## 2022-05-04 NOTE — Progress Notes (Signed)
Follow-up Outpatient Visit Date: 05/04/2022  Primary Care Provider: Teodora Medici, [address] [place] [address]  Chief Complaint: Follow-up syncope, sick sinus syndrome, atrial fibrillation, and cardiomyopathy  HPI:  Sydney Mcdaniel is a 88 y.o. female with history of recurrent falls felt to be due to orthostatic hypotension and vasovagal syncope, sick sinus syndrome status post pacemaker, paroxysmal atrial fibrillation, HFmrEF, HTN, CLL, vitamin B12 deficiency, and anemia , who presents for follow-up of sick sinus syndrome and cardiomyopathy.  She had recurrent syncope earlier this year with event monitor showing symptomatic sinus bradycardia into the upper 30s.  She underwent dual-chamber pacemaker implantation with Dr. Caryl Comes in late March.  She was feeling much better at her last visit with him in July.  She noted 2 episodes of brief weakness associated with paroxysmal atrial fibrillation.  Antiarrhythmic therapy was not recommended.  She was continued on rivaroxaban.  She was encouraged to continue taking midodrine and using an abdominal binder for management of her chronic orthostatic lightheadedness.  She had previously declined ischemia evaluation in the setting of her moderately reduced LVEF in the setting of demand ischemia in January.  Today, Sydney Mcdaniel reports that she feels okay though she is still generally weak and unstable on her feet.  She has fallen twice since her last visit, both of which were due to loss of balance rather than passing out.  She did not have any injuries.  She felt particularly nauseated and weak this past weekend.  Her daughter reports that sodium was found to be normal by her nephrologist, which surprised her given that she has had similar symptoms in the past with hyponatremia.  She has not had any chest pain, shortness of breath, palpitations, or edema.  Home blood pressures have been running fairly high with most systolic readings in  the 140-180 mmHg range.  She has only had one reading less than 110 mmHg in the last month.  --------------------------------------------------------------------------------------------------  Past Medical History:  Diagnosis Date   Allergy    Anemia    Cardiomyopathy ([place])    a. 02/2019 Echo: EF 60-65%, no rwma, doppler parameters consistent w/ pseudonormalization. Nl RV size/fxn; b. 06/2021 Echo: EF 40-45% w/ sev basal-mid antsept HK. GrII DD. Nl RV size/fxn. Mild MR/AI.   Carotid arterial disease ([place])    a. 04/2020 Carotid U/S: 1-39% bilat ICA stenosis, <50% bilat ECA stenosis; b. 06/2021 Carotid U/S: RICA 5-99%, LICA 35-70%.   Cataract    CLL (chronic lymphocytic leukemia) ([facility]CC)    GERD (gastroesophageal reflux disease)    Glaucoma    H/O: hysterectomy    Total   History of stress test    a. 02/2019 MV: EF 60%, no ischemia/infarct.   Hyperlipidemia    Hypertension    Hypothyroidism    Impaired fasting glucose    Lichen sclerosus    Osteoporosis    Hips   PAF (paroxysmal atrial fibrillation) ([place])    a. 06/15/2016 Event monitor: 4% afib burden; b. CHA2DS2VASc - 4-->Xarelto.   Sinus bradycardia    a. avoid AVN blocking agents.   Syncope    a. 03/2016 Echo: EF 55-60%, no rwma, mild AI/MR, nl PASP; b. 03/2016 48h Holter: no significant arrhythmias/pauses; c. 03/2016 MV: mild apical defect, likely breast attenuation, nl EF, low risk; d. 05/2016 Event monitor: No significant arrhythmia; e. 05/2016 Event monitor: PAF (4%).   Past Surgical History:  Procedure Laterality Date   APPENDECTOMY     EYE SURGERY  Glaucoma   MULTIPLE TOOTH EXTRACTIONS N/A    2 teeth   PACEMAKER IMPLANT N/A 09/22/2021   Procedure: PACEMAKER IMPLANT;  Surgeon: Deboraha Sprang, MD;  Location: [facility];  Service: Cardiovascular;  Laterality: N/A;   TOTAL ABDOMINAL HYSTERECTOMY      Current Meds  Medication Sig   acetaminophen (TYLENOL) 500 MG tablet Take 500 mg by mouth every 8 (eight) hours  as needed for moderate pain.   alendronate (FOSAMAX) 70 MG tablet TAKE 1 TABLET BY MOUTH EVERY FRIDAY ON AN EMPTY STOMACH WITH A FULL GLASS OF WATER   atorvastatin (LIPITOR) 20 MG tablet Take 1 tablet (20 mg total) by mouth every evening.   Cholecalciferol 25 MCG (1000 UT) tablet Take by mouth.   ferrous sulfate 325 (65 FE) MG tablet Take 2 tablets (650 mg total) by mouth daily with breakfast.   latanoprost (XALATAN) 0.005 % ophthalmic solution Place 1 drop into the right eye at bedtime.    levothyroxine (EUTHYROX) 75 MCG tablet TAKE ONE TABLET BY MOUTH ON MONDAYS, WEDNESDAYS, FRIDAYS, AND SUNDAYS   levothyroxine (SYNTHROID) 88 MCG tablet TAKE 1 TABLET BY MOUTH ON TUESDAYS, THURSDAYS AND SATURDAYS   lisinopril (ZESTRIL) 40 MG tablet Take 1 tablet by mouth once daily   midodrine (PROAMATINE) 2.5 MG tablet Take 1 tablet (2.5 mg) by mouth when waking up in the morning & take 1 tablet (2.5 mg) by mouth after your nap   Netarsudil Dimesylate (RHOPRESSA) 0.02 % SOLN Place 1 drop into the right eye at bedtime.   ondansetron (ZOFRAN) 8 MG tablet Take 8 mg by mouth as needed.   polyethylene glycol (MIRALAX / GLYCOLAX) 17 g packet Take 17 g by mouth daily.   Rivaroxaban (XARELTO) 15 MG TABS tablet TAKE 1 TABLET EVERY DAY WITH SUPPER   sodium chloride 1 g tablet Take 1 g by mouth. Takes 1/2 tablet with breakfast and lunch and 1 whole tablet with supper   spironolactone (ALDACTONE) 25 MG tablet Take 25 mg by mouth daily.   timolol (TIMOPTIC) 0.5 % ophthalmic solution Place 1 drop into the right eye 2 (two) times daily. Instill 1 drop in the morning and 1 drop at 1700   vitamin B-12 (CYANOCOBALAMIN) 100 MCG tablet Take 100 mcg by mouth daily.    Allergies: Alphagan [brimonidine] and Pravachol [pravastatin sodium]  Social History   Tobacco Use   Smoking status: Never   Smokeless tobacco: Never  Vaping Use   Vaping Use: Never used  Substance Use Topics   Alcohol use: No   Drug use: No    Family  History  Problem Relation Age of Onset   Heart attack Mother    Glaucoma Mother    Heart attack Father    Parkinson's disease Brother    Heart attack Brother     Review of Systems: A 12-system review of systems was performed and was negative except as noted in the HPI.  --------------------------------------------------------------------------------------------------  Physical Exam: BP (!) 190/94 (BP Location: Right Arm, Patient Position: Sitting, Cuff Size: Normal)   Pulse 78   Ht '5\' 2"'$  (1.575 m)   Wt 135 lb (61.2 kg)   LMP  (LMP Unknown)   SpO2 99%   BMI 24.69 kg/m   General: Elderly woman accompanied by her daughter.  Her gait is noticeably unstable and requires assistance with transfers. Neck: No JVD or HJR. Lungs: Clear to auscultation bilaterally without wheezes or crackles. Heart: Regular rate and rhythm without murmurs, rubs, or gallops.  Abdomen: Soft, nontender, nondistended. Extremities: No lower extremity edema.  EKG:-year-old fibrillation with IVCD and nonspecific T wave abnormality.  Compared to 01/30/2022, atrial fibrillation has replaced atrial pacing.  Lab Results  Component Value Date   WBC 8.5 01/31/2022   HGB 11.1 (L) 01/31/2022   HCT 32.6 (L) 01/31/2022   MCV 91.3 01/31/2022   PLT 188 01/31/2022    Lab Results  Component Value Date   NA 131 (L) 02/02/2022   K 5.0 02/02/2022   CL 99 02/02/2022   CO2 24 02/02/2022   BUN 16 02/02/2022   CREATININE 0.99 (H) 02/02/2022   GLUCOSE 95 02/02/2022   ALT 12 01/31/2022    Lab Results  Component Value Date   CHOL 171 11/17/2020   HDL 55 11/17/2020   LDLCALC 90 11/17/2020   TRIG 147 11/17/2020   CHOLHDL 3.1 11/17/2020    --------------------------------------------------------------------------------------------------  ASSESSMENT AND PLAN: Paroxysmal atrial fibrillation: EKG today is suspicious for underlying atrial fibrillation with IVCD.  In the past, Ms. Mentzer has been symptomatic with her  atrial fibrillation though she is at her baseline today.  We will defer medication changes today, continuing rivaroxaban 15 mg daily given her marginal creatinine clearance (35 mL/min by Cockcroft-Gault equation).  Orthostatic lightheadedness and hypertension: This has been a longstanding problem for Ms. Chretien.  However, she is quite hypertensive today, with similar readings at home.  We have agreed to hold midodrine.  I encouraged her to continue using her abdominal binder.  We will not change her current antihypertensive regimen.  Heart failure with moderately reduced ejection fraction: Ms. Barrero appears euvolemic and does not have any obvious heart failure symptoms though her functional capacity is quite limited.  Given drop in LVEF noted in August with regional wall motion abnormality, there has been concern for underlying ischemic heart disease.  We discussed repeating an echocardiogram and pursuing ischemia evaluation, though given her advanced age, we have agreed to defer this.  She would not wish to undergo invasive procedures like cardiac catheterization.  We will continue lisinopril 40 mg daily and spironolactone 25 mg daily (creatinine and potassium reasonable on the labs through her nephrologist earlier this month).  If blood pressure allows and orthostatic symptoms do not worsen with discontinuation of midodrine, addition of a beta-blocker could be considered at our next visit now that she has a pacemaker in place.  Continue atorvastatin for secondary prevention.  Consider lipid panel at her next visit.  Sick sinus syndrome: Ongoing follow-up through the device clinic and Dr. Caryl Comes.  Follow-up: Return to clinic in 1 month.  Nelva Bush, MD 05/04/2022 2:16 PM

## 2022-05-05 ENCOUNTER — Ambulatory Visit: Payer: Medicare Other | Admitting: Internal Medicine

## 2022-05-06 ENCOUNTER — Encounter: Payer: Self-pay | Admitting: Internal Medicine

## 2022-05-06 DIAGNOSIS — I495 Sick sinus syndrome: Secondary | ICD-10-CM | POA: Insufficient documentation

## 2022-05-06 DIAGNOSIS — I5020 Unspecified systolic (congestive) heart failure: Secondary | ICD-10-CM | POA: Insufficient documentation

## 2022-05-09 ENCOUNTER — Ambulatory Visit (INDEPENDENT_AMBULATORY_CARE_PROVIDER_SITE_OTHER): Payer: Medicare Other | Admitting: Internal Medicine

## 2022-05-09 ENCOUNTER — Encounter: Payer: Self-pay | Admitting: Internal Medicine

## 2022-05-09 VITALS — BP 138/84 | HR 74 | Temp 98.1°F | Resp 16 | Ht 62.0 in | Wt 135.7 lb

## 2022-05-09 DIAGNOSIS — M810 Age-related osteoporosis without current pathological fracture: Secondary | ICD-10-CM

## 2022-05-09 DIAGNOSIS — R262 Difficulty in walking, not elsewhere classified: Secondary | ICD-10-CM

## 2022-05-09 DIAGNOSIS — I10 Essential (primary) hypertension: Secondary | ICD-10-CM

## 2022-05-09 DIAGNOSIS — E039 Hypothyroidism, unspecified: Secondary | ICD-10-CM

## 2022-05-09 MED ORDER — LEVOTHYROXINE SODIUM 75 MCG PO TABS: ORAL_TABLET | ORAL | 2 refills | Status: DC

## 2022-05-09 MED ORDER — ALENDRONATE SODIUM 70 MG PO TABS: ORAL_TABLET | ORAL | 1 refills | Status: DC

## 2022-05-09 MED ORDER — LEVOTHYROXINE SODIUM 88 MCG PO TABS
ORAL_TABLET | ORAL | 0 refills | Status: DC
Start: 2022-05-09 — End: 2022-06-23

## 2022-05-09 NOTE — Patient Instructions (Signed)
It was great seeing you today!  Plan discussed at today's visit: -Medications refilled -Continue to monitor blood pressure at home - aim for 130-140/80-90  Follow up in: 6 months  Take care and let us know if you have any questions or concerns prior to your next visit.  Dr. Rosana Berger

## 2022-05-09 NOTE — Progress Notes (Signed)
Established Patient Office Visit  Subjective   Patient ID: Sydney Mcdaniel, female    DOB: 10-31-1924  Age: 88 y.o. MRN: [id number]  Chief Complaint  Patient presents with   Follow-up   Hyperlipidemia   Hypothyroidism    HPI Sydney Mcdaniel is here for follow up on chronic medical conditions. She is here with her daughter.   Recurrent Falls: -Had another fall about 2 weeks ago - according to the patient's daughter the patient had had a BM, walked to the kitchen and was worried she had to have another BM so she walked back to the bathroom where she fell. She did not lose consciousness or hit her head but doesn't remember exactly what happened. She fell on her butt and didn't hit any extremities. -She ambulates with a walker at home and a cane outside the house -No steps in the house - one step to get from the backyard to the house -She is now working with PT  Hypertension/A.Fib/CHF: -Medications: Lisinopril 40 mg, Xarelto 15 mg, Spirolactone 25 mg. Midodrine 2.25 BID discontinued by Cardiology after her BP was high at PT. Amlodipine had been discontinued previously due to hypotension and bradycardia -Patient is compliant with above medications and reports no side effects at this point. -Checking BP at home (average): 140/90's  -Denies any SOB, CP, vision changes, LE edema or symptoms of hypotension -Following with Cardiology, note from 05/04/22 reviewed  -Last TTE in 1/23 with EF of 40-45%, euvolemic today -ICD implanted on 09/22/21, checked on 10/05/21. Doing very well since pacemaker was implanted.  Blood pressure has been stable.  She denies chest pain, palpitations, shortness of breath, dizziness, new focal weakness, imbalance issues.   HLD: -Medications: Lipitor 20 mg -Patient is compliant with above medications and reports no side effects.  -Last lipid panel: Lipid Panel     Component Value Date/Time   CHOL 171 11/17/2020 0855   TRIG 147 11/17/2020 0855   HDL 55 11/17/2020  0855   CHOLHDL 3.1 11/17/2020 0855   CHOLHDL 3.1 07/30/2019 0925   VLDL 22 07/30/2019 0925   LDLCALC 90 11/17/2020 0855   LDLCALC 90 07/02/2018 1040   LABVLDL 26 11/17/2020 0855   Hypothyroidism: -Medications: Levothyroxine 88 mcg on Tuesdays, Thursdays and Saturdays and 75 mcg on Mondays, Wednesdays, Fridays and Sundays -Patient is compliant with the above medication (s) at the above dose and reports no medication side effects.  -Denies weight changes, cold./heat intolerance, skin changes, anxiety/palpitations  -Last TSH: 9/22 2.05, thyroid panel normal   SIADH: -Following with Nephrology at [facility], note reviewed from 11/03/2021.  Patient's daughter states that since her last several sodiums had been normal - last sodium 11/6 138 -Had been on a fluid restriction, however patient's daughter states the only dietary changes the patient has had lately is eating more vegetables from the garden this summer.  Fluid intake has not changed.  Osteoporosis:  -Currently on Fosamax 70 mg weekly for 3 years at this point  -DEXA results from 2/20 showed t score 1.4, left femur -1.4 and in left forearm -2.8.  -Not currently on Vitamin D or calcium   Review of Systems  Constitutional:  Negative for chills and fever.  Eyes:  Negative for blurred vision.  Respiratory:  Negative for shortness of breath.   Cardiovascular:  Negative for chest pain and palpitations.  Musculoskeletal:  Negative for falls.  Neurological:  Negative for dizziness, tingling and headaches.      Objective:  BP 138/84   Pulse 74   Temp 98.1 F (36.7 C)   Resp 16   Ht _0  (1.575 m)   Wt 135 lb 11.2 oz (61.6 kg)   LMP  (LMP Unknown)   SpO2 98%   BMI 24.82 kg/m  BP Readings from Last 3 Encounters:  05/09/22 138/84  05/04/22 (!) 190/94  02/02/22 (!) 140/62   Wt Readings from Last 3 Encounters:  05/09/22 135 lb 11.2 oz (61.6 kg)  05/04/22 135 lb (61.2 kg)  02/02/22 138 lb 8 oz (62.8 kg)     Physical Exam Constitutional:      Appearance: Normal appearance.  HENT:     Head: Normocephalic and atraumatic.  Eyes:     Conjunctiva/sclera: Conjunctivae normal.  Cardiovascular:     Rate and Rhythm: Normal rate and regular rhythm.  Pulmonary:     Effort: Pulmonary effort is normal.     Breath sounds: Normal breath sounds.  Musculoskeletal:     Right lower leg: No edema.     Left lower leg: No edema.  Skin:    General: Skin is warm and dry.     Findings: Bruising present.  Neurological:     General: No focal deficit present.     Mental Status: She is alert. Mental status is at baseline.  Psychiatric:        Mood and Affect: Mood normal.        Behavior: Behavior normal.      No results found for any visits on 05/09/22.  Last CBC Lab Results  Component Value Date   WBC 8.5 01/31/2022   HGB 11.1 (L) 01/31/2022   HCT 32.6 (L) 01/31/2022   MCV 91.3 01/31/2022   MCH 31.1 01/31/2022   RDW 13.3 01/31/2022   PLT 188 91/63/8466   Last metabolic panel Lab Results  Component Value Date   GLUCOSE 95 02/02/2022   NA 131 (L) 02/02/2022   K 5.0 02/02/2022   CL 99 02/02/2022   CO2 24 02/02/2022   BUN 16 02/02/2022   CREATININE 0.99 (H) 02/02/2022   EGFR 65 09/13/2021   CALCIUM 9.4 02/02/2022   PROT 7.1 01/31/2022   ALBUMIN 3.8 01/31/2022   LABGLOB 2.4 06/22/2015   AGRATIO 1.6 06/22/2015   BILITOT 1.0 01/31/2022   ALKPHOS 26 (L) 01/31/2022   AST 18 01/31/2022   ALT 12 01/31/2022   ANIONGAP 7 01/31/2022   Last lipids Lab Results  Component Value Date   CHOL 171 11/17/2020   HDL 55 11/17/2020   LDLCALC 90 11/17/2020   TRIG 147 11/17/2020   CHOLHDL 3.1 11/17/2020   Last hemoglobin A1c Lab Results  Component Value Date   HGBA1C 6.0 (H) 07/02/2018   Last thyroid functions Lab Results  Component Value Date   TSH 2.05 03/03/2021   T4TOTAL 8.9 03/03/2021   Last vitamin D Lab Results  Component Value Date   VD25OH 35 09/09/2018   Last vitamin B12  and Folate Lab Results  Component Value Date   [id number] 11/16/2021   FOLATE 22.0 11/16/2021      The ASCVD Risk score (Arnett DK, et al., 2019) failed to calculate for the following reasons:   The 2019 ASCVD risk score is only valid for ages 49 to 66    Assessment & Plan:   1. Hypothyroidism, unspecified type: Stable, continue current regimen, refilled today. Plan to recheck labs at follow up.  - levothyroxine (EUTHYROX) 75 MCG tablet; TAKE ONE TABLET BY MOUTH  ON MONDAYS, WEDNESDAYS, FRIDAYS, AND SUNDAYS  Dispense: 52 tablet; Refill: 2 - levothyroxine (SYNTHROID) 88 MCG tablet; TAKE 1 TABLET BY MOUTH ON TUESDAYS, THURSDAYS AND SATURDAYS  Dispense: 39 tablet; Refill: 0  2. Essential hypertension: Much better controlled. Midodrine discontinued, currently on Lisinopril 40 mg and Spironolactone 25 mg. She will continue to monitor blood pressure at home and let me know if it starts being elevated again at home.   3. Ambulatory dysfunction/Age-related osteoporosis without current pathological fracture: Working with physical therapy, is on Xarelto for A.Fib. Continue Fosamax for osteoporosis, refilled today.   - alendronate (FOSAMAX) 70 MG tablet; TAKE 1 TABLET BY MOUTH EVERY FRIDAY ON AN EMPTY STOMACH WITH A FULL GLASS OF WATER  Dispense: 12 tablet; Refill: 1   Return in about 6 months (around 11/07/2022).    Teodora Medici, DO

## 2022-05-11 ENCOUNTER — Ambulatory Visit: Payer: Medicare Other

## 2022-05-11 DIAGNOSIS — R2681 Unsteadiness on feet: Secondary | ICD-10-CM

## 2022-05-11 DIAGNOSIS — R262 Difficulty in walking, not elsewhere classified: Secondary | ICD-10-CM | POA: Diagnosis not present

## 2022-05-11 DIAGNOSIS — M6281 Muscle weakness (generalized): Secondary | ICD-10-CM

## 2022-05-11 DIAGNOSIS — R1312 Dysphagia, oropharyngeal phase: Secondary | ICD-10-CM | POA: Diagnosis not present

## 2022-05-11 NOTE — Therapy (Signed)
OUTPATIENT PHYSICAL THERAPY NEURO TREATMENT NOTE  Patient Name: Sydney Mcdaniel MRN: [id number] DOB:1924/10/21, 88 y.o., female Today's Date: 05/11/2022  PCP: Teodora Medici, DO REFERRING PROVIDER: Teodora Medici, DO    PT End of Session - 05/11/22 0929     Visit Number 7    Number of Visits 25    Date for PT Re-Evaluation 06/26/22    Authorization Time Period 04/03/22-06/26/2022    Progress Note Due on Visit 10    PT Start Time 0930    PT Stop Time 1015    PT Time Calculation (min) 45 min    Equipment Utilized During Treatment Gait belt    Activity Tolerance Patient tolerated treatment well    Behavior During Therapy WFL for tasks assessed/performed               Past Medical History:  Diagnosis Date   Allergy    Anemia    Cardiomyopathy ([place])    a. 02/2019 Echo: EF 60-65%, no rwma, doppler parameters consistent w/ pseudonormalization. Nl RV size/fxn; b. 06/2021 Echo: EF 40-45% w/ sev basal-mid antsept HK. GrII DD. Nl RV size/fxn. Mild MR/AI.   Carotid arterial disease ([place])    a. 04/2020 Carotid U/S: 1-39% bilat ICA stenosis, <50% bilat ECA stenosis; b. 06/2021 Carotid U/S: RICA 9-56%, LICA 38-75%.   Cataract    CLL (chronic lymphocytic leukemia) (HCC)    GERD (gastroesophageal reflux disease)    Glaucoma    H/O: hysterectomy    Total   History of stress test    a. 02/2019 MV: EF 60%, no ischemia/infarct.   Hyperlipidemia    Hypertension    Hypothyroidism    Impaired fasting glucose    Lichen sclerosus    Osteoporosis    Hips   PAF (paroxysmal atrial fibrillation) ([place])    a. 06/15/2016 Event monitor: 4% afib burden; b. CHA2DS2VASc - 4-->Xarelto.   Sinus bradycardia    a. avoid AVN blocking agents.   Syncope    a. 03/2016 Echo: EF 55-60%, no rwma, mild AI/MR, nl PASP; b. 03/2016 48h Holter: no significant arrhythmias/pauses; c. 03/2016 MV: mild apical defect, likely breast attenuation, nl EF, low risk; d. 05/2016 Event monitor: No significant  arrhythmia; e. 05/2016 Event monitor: PAF (4%).   Past Surgical History:  Procedure Laterality Date   APPENDECTOMY     EYE SURGERY     Glaucoma   MULTIPLE TOOTH EXTRACTIONS N/A    2 teeth   PACEMAKER IMPLANT N/A 09/22/2021   Procedure: PACEMAKER IMPLANT;  Surgeon: Deboraha Sprang, MD;  Location: [facility];  Service: Cardiovascular;  Laterality: N/A;   TOTAL ABDOMINAL HYSTERECTOMY     Patient Active Problem List   Diagnosis Date Noted   HFrEF (heart failure with reduced ejection fraction) ([place]) 05/06/2022   Sick sinus syndrome ([place]) 05/06/2022   General weakness 02/01/2022   Arm pain, anterior, right 01/30/2022   SIADH (syndrome of inappropriate ADH production) ([place]) 07/05/2021   Aspiration pneumonia ([place]) 05/21/2021   Elevated troponin 05/20/2021   Fall 02/11/2021   Normocytic anemia 05/31/2020   Goals of care, counseling/discussion 03/08/2020   Positive direct antiglobulin test (DAT) 12/04/2019   Sinus bradycardia 04/09/2019   First degree AV block 04/09/2019   Osteoporosis 08/21/2018   Bilateral carotid artery stenosis 04/19/2018   History of syncope 10/17/2017   MCI (mild cognitive impairment) 08/12/2017   Lung nodules 07/09/2017   Orthostatic lightheadedness 04/18/2017   Paroxysmal atrial fibrillation ([place]) 04/18/2017   Hyponatremia 10/20/2016  Chronic diastolic congestive heart failure ([place]) 06/28/2016   Syncope, near 05/24/2016   Primary open-angle glaucoma, bilateral, mild stage 05/08/2016   Bladder prolapse, female, acquired 02/25/2016   Asymptomatic bacteriuria 02/25/2016   B12 deficiency 01/11/2016   Anemia 01/10/2016   CLL (chronic lymphocytic leukemia) ([place]) 01/10/2016   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 12/21/2015   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 12/21/2015   Bradycardia 12/21/2015   Lymphocytosis 06/25/2015   Impaired fasting glucose 06/22/2015   Carotid atherosclerosis 06/22/2015   Essential hypertension 12/22/2014    Hyperlipidemia 12/22/2014   Hypothyroidism 12/22/2014    ONSET DATE: 01/30/2022  REFERRING DIAG: R26.9 (ICD-10-CM) - Abnormality of gait and mobility  THERAPY DIAG:  Difficulty in walking, not elsewhere classified  Muscle weakness (generalized)  Unsteadiness on feet  Rationale for Evaluation and Treatment Rehabilitation  SUBJECTIVE:   SUBJECTIVE STATEMENT:  Pt went to the cardiologist and they took her off the [place], and her BP has leveled off since the change.  Her PCP also agreed with the change as well.    [name]: family member [name]  PERTINENT HISTORY: [name] back in 2022 for weakness, imbalance. She returns for generalized weakness, balance issues and falls following hospitalization 01/30/22-01/31/22 for acute on chronic hyponatremia, near syncope, RUE pain and generalized weakness. Pt biggest concern is her balance. 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   FALLS: Has patient fallen in last 6 months? Yes. Number of falls 1  LIVING ENVIRONMENT: Lives with: lives with their family and lives alone, but daughter lives next door Lives in: House/apartment Stairs:  pt has a ramp, but one step from back porch into the house (pt grabs onto shelves on R hand side) Has following equipment at home: Single point cane, Walker - 4 wheeled, Wheelchair (manual), shower chair, bed side commode, Grab bars, and Ramped  entry  PLOF: Independent  PATIENT GOALS "I want more balance"    OBJECTIVE:   DIAGNOSTIC FINDINGS:   CT head WO contrast (5MM) 8/7/20203 per chart "IMPRESSION: No evidence of acute intracranial abnormality."  COGNITION: Overall cognitive status: History of cognitive impairments - at baseline per pt's daughter, mild cog impairment per chart   SENSATION: Pt daughter reports usually LLE impaired vs. RLE.  Pt able to feel light touch to BLE on exam  COORDINATION: WNL BUE and BLE   EDEMA:  Dtr reports swelling in LLE compared to RLE (reports chronic issue)   POSTURE: rounded shoulders, forward head, and increased thoracic kyphosis    LOWER EXTREMITY MMT:    Grossly 4+/5 BLE exception hip flexors 4-/5 BLE   TRANSFERS: Assistive device utilized: Single point cane  Sit to stand: Modified independence Stand to sit: Modified independence Chair to chair: Modified independence   GAIT: Gait pattern: step to pattern, decreased arm swing- Left, decreased stride length, and narrow BOS, dec B hip ext throughout gait cycle Distance walked: 10 m Assistive device utilized: Single point cane Level of assistance:  Close CGA, pt reports fear of falls/unsteadiness observed   FUNCTIONAL TESTs:  5 times  sit to stand: 28 seconds  Timed up and go (TUG): 32 sec with SPC 10 meter walk test: 0.45 m/s with SPC close CGA due to unsteadiness  Berg Balance Scale: deferred   PATIENT SURVEYS:  FOTO 38 (goal score 47)  TODAY'S TREATMENT:  BP: 149/64 mmHg HR: 63 SpO2: 100%  Gait belt donned and CGA provided unless specified otherwise  There.ex:   4# ankle weights donned to each LE for the following: Standing march 2x10 ea LE Standing hip abd 20x each LE, use cane for external cue for proper muscle activation Standing hamstring curl x 10 ea LE, cues for proper muscle activation  2x155 ft amb for endurance SPC and close CGA, fatigues quickly, difficulty with turns  Seated hamstring  curls with BTB resistance, x10 each LE  STS x10 hands-free, continues to require initial cuing for technique  Seated hip adduction into physioball, 3 sec holds, x10  Seated heel raises with 10# AW placed on thigh, x10 each LE   PATIENT EDUCATION: Education details: Pt educated throughout session about proper posture and technique with exercises. Improved exercise technique, movement at target joints, use of target muscles after min to mod verbal, visual, tactile cues.  Person educated: Patient and Child(ren) (adult daughter) Education method: Explanation, Demonstration, Tactile cues, and Verbal cues Education comprehension: verbalized understanding, returned demonstration, verbal cues required, tactile cues required, and needs further education   HOME EXERCISE PROGRAM: No updates today, pt to continue plan as previously indicated Access Code: [email or web address] URL: [email or web address] Date: 04/03/2022 Prepared by: Ricard Dillon  Exercises - Seated March  - 1 x daily - 5 x weekly - 3 sets - 20 reps - alternating leg hold    GOALS: Goals reviewed with patient? Yes  SHORT TERM GOALS: Target date: 06/22/2022  Patient will be independent in home exercise program to improve strength/mobility for better functional independence with ADLs. Baseline: initiated  Goal status: INITIAL    LONG TERM GOALS: Target date: 08/03/2022  Patient will increase FOTO score to equal to or greater than  47   to demonstrate statistically significant improvement in mobility and quality of life.  Baseline: 38 Goal status: INITIAL  2.  Patient (> 50 years old) will complete five times sit to stand test in < 15 seconds indicating an increased LE strength and improved balance. Baseline: 28 sec use of UE Goal status: INITIAL  3. Patient will increase 10 meter walk test to >1.38ms as to improve gait speed for better community ambulation and to reduce fall risk. Baseline: 0.45 m/s w SPC, close  CGA Goal status: INITIAL  4.   Patient will reduce timed up and go to <15 seconds to reduce fall risk and demonstrate improved transfer/gait ability. Baseline: 32 sec with SPC Goal status: INITIAL  5.   Patient will increase Berg Balance score by > 6 points to demonstrate decreased fall risk during functional activities. Baseline: to be completed next 1-2 sessions; 10/18: 32/56 Goal status: INITIAL   ASSESSMENT:  CLINICAL IMPRESSION:  Pt performed well today with all the exercises and was able to perform the same amount of reps with increase weight.  Pt also introduced to exercises that could be added as part of current HEP and she stated that she would be performing at home.   Pt will continue to benefit from skilled therapy to address remaining deficits in order to improve overall QoL and return to PLOF.     OBJECTIVE IMPAIRMENTS Abnormal gait, decreased activity tolerance, decreased balance, decreased  cognition, decreased endurance, decreased knowledge of use of DME, decreased mobility, difficulty walking, decreased strength, impaired vision/preception, improper body mechanics, postural dysfunction, and pain.   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    REHAB POTENTIAL: Good  CLINICAL DECISION MAKING: Evolving/moderate complexity  EVALUATION COMPLEXITY: Moderate  PLAN: PT FREQUENCY: 2x/week  PT DURATION: 12 weeks  PLANNED INTERVENTIONS: Therapeutic exercises, Therapeutic activity, Neuromuscular re-education, Balance training, Gait training, Patient/Family education, Self Care, Joint mobilization, Stair training, Vestibular training, Canalith repositioning, Orthotic/Fit training, DME instructions, Wheelchair mobility training, Spinal mobilization, Cryotherapy, Moist heat, scar mobilization, Splintting, Taping, Ultrasound, Parrafin, Biofeedback, Manual therapy, and Re-evaluation  PLAN FOR NEXT SESSION: Strength, balance, SPC gait training, continue plan   Gwenlyn Saran, PT, DPT Physical Therapist- [facility]  05/11/22, 9:30 AM

## 2022-05-14 DIAGNOSIS — Z23 Encounter for immunization: Secondary | ICD-10-CM | POA: Diagnosis not present

## 2022-05-16 ENCOUNTER — Ambulatory Visit: Payer: Medicare Other

## 2022-05-16 DIAGNOSIS — R2681 Unsteadiness on feet: Secondary | ICD-10-CM | POA: Diagnosis not present

## 2022-05-16 DIAGNOSIS — R262 Difficulty in walking, not elsewhere classified: Secondary | ICD-10-CM

## 2022-05-16 DIAGNOSIS — R1312 Dysphagia, oropharyngeal phase: Secondary | ICD-10-CM

## 2022-05-16 DIAGNOSIS — M6281 Muscle weakness (generalized): Secondary | ICD-10-CM | POA: Diagnosis not present

## 2022-05-16 NOTE — Therapy (Signed)
OUTPATIENT PHYSICAL THERAPY NEURO TREATMENT NOTE  Patient Name: Sydney Mcdaniel MRN: [id number] DOB:February 07, 1925, 88 y.o., female Today's Date: 05/16/2022  PCP: Teodora Medici, DO REFERRING PROVIDER: Teodora Medici, DO    PT End of Session - 05/16/22 0804     Visit Number 8    Number of Visits 25    Date for PT Re-Evaluation 06/26/22    Authorization Time Period 04/03/22-06/26/2022    Progress Note Due on Visit 10    PT Start Time 0804    PT Stop Time 0845    PT Time Calculation (min) 41 min    Equipment Utilized During Treatment Gait belt    Activity Tolerance Patient tolerated treatment well    Behavior During Therapy [facility] for tasks assessed/performed               Past Medical History:  Diagnosis Date   Allergy    Anemia    Cardiomyopathy ([place])    a. 02/2019 Echo: EF 60-65%, no rwma, doppler parameters consistent w/ pseudonormalization. Nl RV size/fxn; b. 06/2021 Echo: EF 40-45% w/ sev basal-mid antsept HK. GrII DD. Nl RV size/fxn. Mild MR/AI.   Carotid arterial disease ([place])    a. 04/2020 Carotid U/S: 1-39% bilat ICA stenosis, <50% bilat ECA stenosis; b. 06/2021 Carotid U/S: RICA 6-22%, LICA 29-79%.   Cataract    CLL (chronic lymphocytic leukemia) (HCC)    GERD (gastroesophageal reflux disease)    Glaucoma    H/O: hysterectomy    Total   History of stress test    a. 02/2019 MV: EF 60%, no ischemia/infarct.   Hyperlipidemia    Hypertension    Hypothyroidism    Impaired fasting glucose    Lichen sclerosus    Osteoporosis    Hips   PAF (paroxysmal atrial fibrillation) ([place])    a. 06/15/2016 Event monitor: 4% afib burden; b. CHA2DS2VASc - 4-->Xarelto.   Sinus bradycardia    a. avoid AVN blocking agents.   Syncope    a. 03/2016 Echo: EF 55-60%, no rwma, mild AI/MR, nl PASP; b. 03/2016 48h Holter: no significant arrhythmias/pauses; c. 03/2016 MV: mild apical defect, likely breast attenuation, nl EF, low risk; d. 05/2016 Event monitor: No significant  arrhythmia; e. 05/2016 Event monitor: PAF (4%).   Past Surgical History:  Procedure Laterality Date   APPENDECTOMY     EYE SURGERY     Glaucoma   MULTIPLE TOOTH EXTRACTIONS N/A    2 teeth   PACEMAKER IMPLANT N/A 09/22/2021   Procedure: PACEMAKER IMPLANT;  Surgeon: Deboraha Sprang, MD;  Location: [facility];  Service: Cardiovascular;  Laterality: N/A;   TOTAL ABDOMINAL HYSTERECTOMY     Patient Active Problem List   Diagnosis Date Noted   HFrEF (heart failure with reduced ejection fraction) ([place]) 05/06/2022   Sick sinus syndrome ([place]) 05/06/2022   General weakness 02/01/2022   Arm pain, anterior, right 01/30/2022   SIADH (syndrome of inappropriate ADH production) ([place]) 07/05/2021   Aspiration pneumonia ([place]) 05/21/2021   Elevated troponin 05/20/2021   Fall 02/11/2021   Normocytic anemia 05/31/2020   Goals of care, counseling/discussion 03/08/2020   Positive direct antiglobulin test (DAT) 12/04/2019   Sinus bradycardia 04/09/2019   First degree AV block 04/09/2019   Osteoporosis 08/21/2018   Bilateral carotid artery stenosis 04/19/2018   History of syncope 10/17/2017   MCI (mild cognitive impairment) 08/12/2017   Lung nodules 07/09/2017   Orthostatic lightheadedness 04/18/2017   Paroxysmal atrial fibrillation ([place]) 04/18/2017   Hyponatremia 10/20/2016  Chronic diastolic congestive heart failure ([place]) 06/28/2016   Syncope, near 05/24/2016   Primary open-angle glaucoma, bilateral, mild stage 05/08/2016   Bladder prolapse, female, acquired 02/25/2016   Asymptomatic bacteriuria 02/25/2016   B12 deficiency 01/11/2016   Anemia 01/10/2016   CLL (chronic lymphocytic leukemia) ([place]) 01/10/2016   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 12/21/2015   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 12/21/2015   Bradycardia 12/21/2015   Lymphocytosis 06/25/2015   Impaired fasting glucose 06/22/2015   Carotid atherosclerosis 06/22/2015   Essential hypertension 12/22/2014    Hyperlipidemia 12/22/2014   Hypothyroidism 12/22/2014    ONSET DATE: 01/30/2022  REFERRING DIAG: R26.9 (ICD-10-CM) - Abnormality of gait and mobility  THERAPY DIAG:  Difficulty in walking, not elsewhere classified  Muscle weakness (generalized)  Unsteadiness on feet  Dysphagia, oropharyngeal phase  Rationale for Evaluation and Treatment Rehabilitation  SUBJECTIVE:   SUBJECTIVE STATEMENT:  Pt's daughter reports that pt had a fall Sunday after church.  She reports that she was wearing knee-high hose on a wood floor and must have just slipped on the floor.   [name]: family member [name]  PERTINENT HISTORY: [name] back in 2022 for weakness, imbalance. She returns for generalized weakness, balance issues and falls following hospitalization 01/30/22-01/31/22 for acute on chronic hyponatremia, near syncope, RUE pain and generalized weakness. Pt biggest concern is her balance. 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   FALLS: Has patient fallen in last 6 months? Yes. Number of falls 1  LIVING ENVIRONMENT: Lives with: lives with their family and lives alone, but daughter lives next door Lives in: House/apartment Stairs:  pt has a ramp, but one step from back porch into the house (pt grabs onto shelves on R hand side) Has following equipment at home: Single point cane, Walker - 4 wheeled, Wheelchair (manual), shower chair, bed side  commode, Grab bars, and Ramped entry  PLOF: Independent  PATIENT GOALS "I want more balance"    OBJECTIVE:   DIAGNOSTIC FINDINGS:   CT head WO contrast (5MM) 8/7/20203 per chart "IMPRESSION: No evidence of acute intracranial abnormality."  COGNITION: Overall cognitive status: History of cognitive impairments - at baseline per pt's daughter, mild cog impairment per chart   SENSATION: Pt daughter reports usually LLE impaired vs. RLE.  Pt able to feel light touch to BLE on exam  COORDINATION: WNL BUE and BLE   EDEMA:  Dtr reports swelling in LLE compared to RLE (reports chronic issue)   POSTURE: rounded shoulders, forward head, and increased thoracic kyphosis    LOWER EXTREMITY MMT:    Grossly 4+/5 BLE exception hip flexors 4-/5 BLE   TRANSFERS: Assistive device utilized: Single point cane  Sit to stand: Modified independence Stand to sit: Modified independence Chair to chair: Modified independence   GAIT: Gait pattern: step to pattern, decreased arm swing- Left, decreased stride length, and narrow BOS, dec B hip ext throughout gait cycle Distance walked: 10 m Assistive device utilized: Single point cane Level of assistance:  Close CGA, pt reports fear of falls/unsteadiness observed   FUNCTIONAL  TESTs:  5 times sit to stand: 28 seconds  Timed up and go (TUG): 32 sec with SPC 10 meter walk test: 0.45 m/s with SPC close CGA due to unsteadiness  Berg Balance Scale: deferred   PATIENT SURVEYS:  FOTO 38 (goal score 47)  TODAY'S TREATMENT:  BP: 157/75 mmHg HR: 74 SpO2: 100%  Gait belt donned and CGA provided unless specified otherwise  TherAct:  Transfer training from fall provided.  Extensive time spent on this education and demonstration.    There.ex:   x155 ft amb for endurance SPC and close CGA, fatigues quickly, difficulty with turns  STS x10 hands-free with adduction into physioball, continues to require initial cuing for technique  Seated  hip adduction into physioball, 3 sec holds, x10    PATIENT EDUCATION: Education details: Pt educated throughout session about proper posture and technique with exercises. Improved exercise technique, movement at target joints, use of target muscles after min to mod verbal, visual, tactile cues.  Description:  When transferring from lying on the floor to standing, it is important to remember proper form to make your movements easier and prevent injury.  Floor to Seated Transfer Training:  Begin lying on your unaffected side.  With assistance from the person helping you, push yourself up onto your unaffected forearm, then place that hand flat on the floor.  Using your unaffected arm for support, shift your knees to come onto all fours (Image 1).  The person assisting you should bring the wheelchair in front of you, making sure the brakes are locked and footrests are moved out of the way.  Place your unaffected hand on the chair seat and bring your unaffected leg forward so that your foot is flat on the floor (Image 2).  With assistance from the person helping you, come up to a standing position.  Reach for the armrest and slowly turn so the wheelchair is behind you. The person assisting you can help you lower into a sitting position (Image 3).  The person assisting you should make sure to keep their abdominals engaged and use their legs to help you transfer positions.   Person educated: Patient and Child(ren) (adult daughter) Education method: Explanation, Demonstration, Tactile cues, and Verbal cues Education comprehension: verbalized understanding, returned demonstration, verbal cues required, tactile cues required, and needs further education   HOME EXERCISE PROGRAM: No updates today, pt to continue plan as previously indicated Access Code: [email or web address] URL: [email or web address] Date: 04/03/2022 Prepared by: Ricard Dillon  Exercises - Seated March  - 1 x daily - 5 x weekly  - 3 sets - 20 reps - alternating leg hold    GOALS: Goals reviewed with patient? Yes  SHORT TERM GOALS: Target date: 06/27/2022  Patient will be independent in home exercise program to improve strength/mobility for better functional independence with ADLs. Baseline: initiated  Goal status: INITIAL    LONG TERM GOALS: Target date: 08/08/2022  Patient will increase FOTO score to equal to or greater than  47   to demonstrate statistically significant improvement in mobility and quality of life.  Baseline: 38 Goal status: INITIAL  2.  Patient (> 18 years old) will complete five times sit to stand test in < 15 seconds indicating an increased LE strength and improved balance. Baseline: 28 sec use of UE Goal status: INITIAL  3. Patient will increase 10 meter walk test to >1.22ms as to improve gait speed for better community ambulation and to reduce fall risk. Baseline: 0.45 m/s w SPC, close  CGA Goal status: INITIAL  4.   Patient will reduce timed up and go to <15 seconds to reduce fall risk and demonstrate improved transfer/gait ability. Baseline: 32 sec with SPC Goal status: INITIAL  5.   Patient will increase Berg Balance score by > 6 points to demonstrate decreased fall risk during functional activities. Baseline: to be completed next 1-2 sessions; 10/18: 32/56 Goal status: INITIAL   ASSESSMENT:  CLINICAL IMPRESSION:  Pt sore today in her buttocks from previous fall, but did well with the transfer training a from floor and seated exercises.  Pt and daughter felt more at ease at conclusion of session in the happenstance that she falls again in the future.  Pt and daughter given printed handout for education on rising from fall.   Pt will continue to benefit from skilled therapy to address remaining deficits in order to improve overall QoL and return to PLOF.     OBJECTIVE IMPAIRMENTS Abnormal gait, decreased activity tolerance, decreased balance, decreased cognition, decreased  endurance, decreased knowledge of use of DME, decreased mobility, difficulty walking, decreased strength, impaired vision/preception, improper body mechanics, postural dysfunction, and pain.   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   REHAB POTENTIAL: Good  CLINICAL DECISION MAKING: Evolving/moderate complexity  EVALUATION COMPLEXITY: Moderate  PLAN: PT FREQUENCY: 2x/week  PT DURATION: 12 weeks  PLANNED INTERVENTIONS: Therapeutic exercises, Therapeutic activity, Neuromuscular re-education, Balance training, Gait training, Patient/Family education, Self Care, Joint mobilization, Stair training, Vestibular training, Canalith repositioning, Orthotic/Fit training, DME instructions, Wheelchair mobility training, Spinal mobilization, Cryotherapy, Moist heat, scar mobilization, Splintting, Taping, Ultrasound, Parrafin, Biofeedback, Manual therapy, and Re-evaluation  PLAN FOR NEXT SESSION: Strength, balance, SPC gait training, continue plan   Gwenlyn Saran, PT, DPT Physical Therapist- [facility]  05/16/22, 9:15 AM

## 2022-05-17 ENCOUNTER — Ambulatory Visit: Payer: Self-pay | Admitting: *Deleted

## 2022-05-17 NOTE — Patient Instructions (Signed)
Visit Information  Thank you for taking time to visit with me today. Please don't hesitate to contact me if I can be of assistance to you before our next scheduled telephone appointment.  Following are the goals we discussed today:  Continue monitoring blood pressure daily.  Record readings and share with provider.  Our next appointment is by telephone on 12/18  Please call the care guide team at [phone] if you need to cancel or reschedule your appointment.   Please call the Suicide and Crisis Lifeline: 988 call the [place] National Suicide Prevention Lifeline: (510)880-2011 or TTY: 507-202-7053 TTY (587)151-4530) to talk to a trained counselor call 1-800-273-TALK (toll free, 24 hour hotline) call 911 if you are experiencing a Mental Health or [place] or need someone to talk to.  Patient verbalizes understanding of instructions and care plan provided today and agrees to view in [place]. Active MyChart status and patient understanding of how to access instructions and care plan via MyChart confirmed with patient.     The patient has been provided with contact information for the care management team and has been advised to call with any health related questions or concerns.   Valente David, RN, MSN, [facility] Care Management Coordinator [phone]

## 2022-05-17 NOTE — Patient Outreach (Signed)
  Care Coordination   Follow Up Visit Note   05/17/2022 Name: Sydney Mcdaniel MRN: [id number] DOB: [birth year]  Sydney Mcdaniel is a 88 y.o. year old female who sees Teodora Medici, DO for primary care. I spoke with Sydney Mcdaniel, daughter of Sydney Mcdaniel by phone today.  What matters to the patients health and wellness today?  Ongoing management of blood pressure.     Goals Addressed             This Visit's Progress    RNCM: Effective Management of health and well being   On track    Care Coordination Interventions: Evaluation of current treatment plan related to hyponatremia and hypertension and patient's adherence to plan as established by provider Advised patient to call the office for changes in conditions, new questions or concerns Provided education to patient re: safety precautions and falls prevention Reviewed medications with patient and discussed compliance. Midodrine has been discontinued, blood pressure is now better/decreased Provided patient with liberalization of sodium in diet, safety and falls prevention educational materials related to hypertension Reviewed scheduled/upcoming provider appointments including cardiology follow up on 12/14 and AWV on 1/30 Discussed plans with patient for ongoing care management follow up and provided patient with direct contact information for care management team Advised patient to discuss changes in conditions, questions, or concerns with provider Spoke to Sydney Mcdaniel, the patients daughter, pt was experiencing hypertension due to Midodrine, now resolved with no longer taking medication.  They are monitoring blood pressure daily.           SDOH assessments and interventions completed:  No     Care Coordination Interventions Activated:  Yes  Care Coordination Interventions:  Yes, provided   Follow up plan: Follow up call scheduled for 12/18    Encounter Outcome:  Pt. Visit Completed   Valente David, RN, MSN, [facility]  Management Care Management Coordinator [phone]

## 2022-05-19 ENCOUNTER — Encounter: Payer: Self-pay | Admitting: Internal Medicine

## 2022-05-22 ENCOUNTER — Other Ambulatory Visit: Payer: Self-pay | Admitting: *Deleted

## 2022-05-22 MED ORDER — AMLODIPINE BESYLATE 5 MG PO TABS: 2.5000 mg | ORAL_TABLET | Freq: Every day | ORAL | 3 refills | Status: DC

## 2022-05-22 NOTE — Telephone Encounter (Signed)
Blood pressure remains labile but overall fairly elevated despite stopping midodrine at our last visit.  If Sydney Mcdaniel has not had any significant orthostatic lightheadedness, I recommend adding amlodipine 2.5 mg daily and following up as planned next month with Ignacia Bayley, NP, to reassess her blood pressure and symptoms.  Sydney Bush, MD [facility]

## 2022-05-23 ENCOUNTER — Ambulatory Visit: Payer: Medicare Other

## 2022-05-23 DIAGNOSIS — R262 Difficulty in walking, not elsewhere classified: Secondary | ICD-10-CM

## 2022-05-23 DIAGNOSIS — R1312 Dysphagia, oropharyngeal phase: Secondary | ICD-10-CM | POA: Diagnosis not present

## 2022-05-23 DIAGNOSIS — R2681 Unsteadiness on feet: Secondary | ICD-10-CM | POA: Diagnosis not present

## 2022-05-23 DIAGNOSIS — M6281 Muscle weakness (generalized): Secondary | ICD-10-CM

## 2022-05-23 NOTE — Therapy (Signed)
OUTPATIENT PHYSICAL THERAPY NEURO TREATMENT NOTE  Patient Name: Sydney Mcdaniel MRN: [id number] DOB:[birth year], 88 y.o., female Today's Date: 05/23/2022  PCP: Teodora Medici, DO REFERRING PROVIDER: Teodora Medici, DO    PT End of Session - 05/23/22 0937     Visit Number 9    Number of Visits 25    Date for PT Re-Evaluation 06/26/22    Authorization Time Period 04/03/22-06/26/2022    Progress Note Due on Visit 10    PT Start Time 0931    PT Stop Time 1015    PT Time Calculation (min) 44 min    Equipment Utilized During Treatment Gait belt    Activity Tolerance Patient tolerated treatment well    Behavior During Therapy [facility] for tasks assessed/performed              Past Medical History:  Diagnosis Date   Allergy    Anemia    Cardiomyopathy ([place])    a. 02/2019 Echo: EF 60-65%, no rwma, doppler parameters consistent w/ pseudonormalization. Nl RV size/fxn; b. 06/2021 Echo: EF 40-45% w/ sev basal-mid antsept HK. GrII DD. Nl RV size/fxn. Mild MR/AI.   Carotid arterial disease ([place])    a. 04/2020 Carotid U/S: 1-39% bilat ICA stenosis, <50% bilat ECA stenosis; b. 06/2021 Carotid U/S: RICA 6-28%, LICA 31-51%.   Cataract    CLL (chronic lymphocytic leukemia) (HCC)    GERD (gastroesophageal reflux disease)    Glaucoma    H/O: hysterectomy    Total   History of stress test    a. 02/2019 MV: EF 60%, no ischemia/infarct.   Hyperlipidemia    Hypertension    Hypothyroidism    Impaired fasting glucose    Lichen sclerosus    Osteoporosis    Hips   PAF (paroxysmal atrial fibrillation) ([place])    a. 06/15/2016 Event monitor: 4% afib burden; b. CHA2DS2VASc - 4-->Xarelto.   Sinus bradycardia    a. avoid AVN blocking agents.   Syncope    a. 03/2016 Echo: EF 55-60%, no rwma, mild AI/MR, nl PASP; b. 03/2016 48h Holter: no significant arrhythmias/pauses; c. 03/2016 MV: mild apical defect, likely breast attenuation, nl EF, low risk; d. 05/2016 Event monitor: No significant arrhythmia;  e. 05/2016 Event monitor: PAF (4%).   Past Surgical History:  Procedure Laterality Date   APPENDECTOMY     EYE SURGERY     Glaucoma   MULTIPLE TOOTH EXTRACTIONS N/A    2 teeth   PACEMAKER IMPLANT N/A 09/22/2021   Procedure: PACEMAKER IMPLANT;  Surgeon: Deboraha Sprang, MD;  Location: [facility];  Service: Cardiovascular;  Laterality: N/A;   TOTAL ABDOMINAL HYSTERECTOMY     Patient Active Problem List   Diagnosis Date Noted   HFrEF (heart failure with reduced ejection fraction) ([place]) 05/06/2022   Sick sinus syndrome ([place]) 05/06/2022   General weakness 02/01/2022   Arm pain, anterior, right 01/30/2022   SIADH (syndrome of inappropriate ADH production) ([place]) 07/05/2021   Aspiration pneumonia ([place]) 05/21/2021   Elevated troponin 05/20/2021   Fall 02/11/2021   Normocytic anemia 05/31/2020   Goals of care, counseling/discussion 03/08/2020   Positive direct antiglobulin test (DAT) 12/04/2019   Sinus bradycardia 04/09/2019   First degree AV block 04/09/2019   Osteoporosis 08/21/2018   Bilateral carotid artery stenosis 04/19/2018   History of syncope 10/17/2017   MCI (mild cognitive impairment) 08/12/2017   Lung nodules 07/09/2017   Orthostatic lightheadedness 04/18/2017   Paroxysmal atrial fibrillation ([place]) 04/18/2017   Hyponatremia 10/20/2016  Chronic diastolic congestive heart failure ([place]) 06/28/2016   Syncope, near 05/24/2016   Primary open-angle glaucoma, bilateral, mild stage 05/08/2016   Bladder prolapse, female, acquired 02/25/2016   Asymptomatic bacteriuria 02/25/2016   B12 deficiency 01/11/2016   Anemia 01/10/2016   CLL (chronic lymphocytic leukemia) ([place]) 01/10/2016   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 12/21/2015   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 12/21/2015   Bradycardia 12/21/2015   Lymphocytosis 06/25/2015   Impaired fasting glucose 06/22/2015   Carotid atherosclerosis 06/22/2015   Essential hypertension 12/22/2014    Hyperlipidemia 12/22/2014   Hypothyroidism 12/22/2014    ONSET DATE: 01/30/2022  REFERRING DIAG: R26.9 (ICD-10-CM) - Abnormality of gait and mobility  THERAPY DIAG:  Difficulty in walking, not elsewhere classified  Muscle weakness (generalized)  Unsteadiness on feet  Rationale for Evaluation and Treatment Rehabilitation  SUBJECTIVE:   SUBJECTIVE STATEMENT:  Pt reports immediate family was over for Thanksgiving and it was good.  Pt notes denies any falls since the last visit.   [name]: family member Sydney Mcdaniel  PERTINENT HISTORY: [name] back in 2022 for weakness, imbalance. She returns for generalized weakness, balance issues and falls following hospitalization 01/30/22-01/31/22 for acute on chronic hyponatremia, near syncope, RUE pain and generalized weakness. Pt biggest concern is her balance. 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   FALLS: Has patient fallen in last 6 months? Yes. Number of falls 1  LIVING ENVIRONMENT: Lives with: lives with their family and lives alone, but daughter lives next door Lives in: House/apartment Stairs:  pt has a ramp, but one step from back porch into the house (pt grabs onto shelves on R hand side) Has following equipment at home: Single point cane, Walker - 4 wheeled, Wheelchair (manual), shower chair, bed side commode, Grab bars, and Ramped entry  PLOF: Independent  PATIENT GOALS: "I want  more balance"    OBJECTIVE:   DIAGNOSTIC FINDINGS:   CT head WO contrast (5MM) 8/7/20203 per chart "IMPRESSION: No evidence of acute intracranial abnormality."  COGNITION: Overall cognitive status: History of cognitive impairments - at baseline per pt's daughter, mild cog impairment per chart   SENSATION: Pt daughter reports usually LLE impaired vs. RLE.  Pt able to feel light touch to BLE on exam  COORDINATION: WNL BUE and BLE   EDEMA:  Dtr reports swelling in LLE compared to RLE (reports chronic issue)   POSTURE: rounded shoulders, forward head, and increased thoracic kyphosis    LOWER EXTREMITY MMT:    Grossly 4+/5 BLE exception hip flexors 4-/5 BLE   TRANSFERS: Assistive device utilized: Single point cane  Sit to stand: Modified independence Stand to sit: Modified independence Chair to chair: Modified independence   GAIT: Gait pattern: step to pattern, decreased arm swing- Left, decreased stride length, and narrow BOS, dec B hip ext throughout gait cycle Distance walked: 10 m Assistive device utilized: Single point cane Level of assistance:  Close CGA, pt reports fear of falls/unsteadiness observed   FUNCTIONAL TESTs:  5 times sit to stand: 28 seconds  Timed up and go (  TUG): 32 sec with SPC 10 meter walk test: 0.45 m/s with SPC close CGA due to unsteadiness  Berg Balance Scale: deferred   PATIENT SURVEYS:  FOTO 38 (goal score 47)  TODAY'S TREATMENT:  BP: 161/78 mmHg HR: 72 SpO2: 100%  Gait belt donned and CGA provided unless specified otherwise  There.ex:   4# ankle weights donned to each LE for the following: Seated LAQ, 2x10 each LE Standing march 2x10 ea LE Standing hip abd 20x each LE, use cane for external cue for proper muscle activation Standing hamstring curl x 10 ea LE, cues for proper muscle activation  2x155 ft amb for endurance SPC and close CGA, difficulty with turns but endurance much better today  Seated hamstring curls with  BTB resistance, x10 each LE  STS x10 hands-free, continues to require initial cuing for technique  Seated hip adduction into physioball, 3 sec holds, x10    PATIENT EDUCATION: Education details: Pt educated throughout session about proper posture and technique with exercises. Improved exercise technique, movement at target joints, use of target muscles after min to mod verbal, visual, tactile cues.  Person educated: Patient and Child(ren) (adult daughter) Education method: Explanation, Demonstration, Tactile cues, and Verbal cues Education comprehension: verbalized understanding, returned demonstration, verbal cues required, tactile cues required, and needs further education   HOME EXERCISE PROGRAM: No updates today, pt to continue plan as previously indicated Access Code: [email or web address] URL: [email or web address] Date: 04/03/2022 Prepared by: Ricard Dillon  Exercises - Seated March  - 1 x daily - 5 x weekly - 3 sets - 20 reps - alternating leg hold    GOALS: Goals reviewed with patient? Yes  SHORT TERM GOALS: Target date: 07/04/2022  Patient will be independent in home exercise program to improve strength/mobility for better functional independence with ADLs. Baseline: initiated  Goal status: INITIAL    LONG TERM GOALS: Target date: 08/15/2022  Patient will increase FOTO score to equal to or greater than  47   to demonstrate statistically significant improvement in mobility and quality of life.  Baseline: 38 Goal status: INITIAL  2.  Patient (> 25 years old) will complete five times sit to stand test in < 15 seconds indicating an increased LE strength and improved balance. Baseline: 28 sec use of UE Goal status: INITIAL  3. Patient will increase 10 meter walk test to >1.31ms as to improve gait speed for better community ambulation and to reduce fall risk. Baseline: 0.45 m/s w SPC, close CGA Goal status: INITIAL  4.   Patient will reduce timed up and go to <15  seconds to reduce fall risk and demonstrate improved transfer/gait ability. Baseline: 32 sec with SPC Goal status: INITIAL  5.   Patient will increase Berg Balance score by > 6 points to demonstrate decreased fall risk during functional activities. Baseline: to be completed next 1-2 sessions; 10/18: 32/56 Goal status: INITIAL   ASSESSMENT:  CLINICAL IMPRESSION: Pt performed well with the tasks given and is making good progress towards goals.  Pt consistently performs well with all exercises given and is able to progress to increased weights and improved endurance levels with ambulation.  Pt will continue to benefit from skilled therapy in order to address the remaining deficits going forward.      OBJECTIVE IMPAIRMENTS Abnormal gait, decreased activity tolerance, decreased balance, decreased cognition, decreased endurance, decreased knowledge of use of DME, decreased mobility, difficulty walking, decreased strength, impaired vision/preception, improper body mechanics, postural dysfunction, and pain.   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   REHAB POTENTIAL: Good  CLINICAL DECISION MAKING: Evolving/moderate complexity  EVALUATION COMPLEXITY:  Moderate  PLAN: PT FREQUENCY: 2x/week  PT DURATION: 12 weeks  PLANNED INTERVENTIONS: Therapeutic exercises, Therapeutic activity, Neuromuscular re-education, Balance training, Gait training, Patient/Family education, Self Care, Joint mobilization, Stair training, Vestibular training, Canalith repositioning, Orthotic/Fit training, DME instructions, Wheelchair mobility training, Spinal mobilization, Cryotherapy, Moist heat, scar mobilization, Splintting, Taping, Ultrasound, Parrafin, Biofeedback, Manual therapy, and Re-evaluation  PLAN FOR NEXT SESSION:   Strength, balance, SPC gait training, continue plan   Gwenlyn Saran, PT, DPT Physical Therapist- [facility]  05/23/22, 9:38 AM

## 2022-05-24 NOTE — Therapy (Signed)
OUTPATIENT PHYSICAL THERAPY NEURO TREATMENT NOTE  Patient Name: Sydney Mcdaniel MRN: [id number] DOB:[birth year], 88 y.o., female Today's Date: 05/25/2022  PCP: Teodora Medici, DO REFERRING PROVIDER: Teodora Medici, DO    PT End of Session - 05/25/22 1146     Visit Number 10    Number of Visits 25    Date for PT Re-Evaluation 06/26/22    Authorization Time Period 04/03/22-06/26/2022    Progress Note Due on Visit 10    PT Start Time 1146    PT Stop Time 1230    PT Time Calculation (min) 44 min    Equipment Utilized During Treatment Gait belt    Activity Tolerance Patient tolerated treatment well    Behavior During Therapy WFL for tasks assessed/performed               Past Medical History:  Diagnosis Date   Allergy    Anemia    Cardiomyopathy ([place])    a. 02/2019 Echo: EF 60-65%, no rwma, doppler parameters consistent w/ pseudonormalization. Nl RV size/fxn; b. 06/2021 Echo: EF 40-45% w/ sev basal-mid antsept HK. GrII DD. Nl RV size/fxn. Mild MR/AI.   Carotid arterial disease ([place])    a. 04/2020 Carotid U/S: 1-39% bilat ICA stenosis, <50% bilat ECA stenosis; b. 06/2021 Carotid U/S: RICA 1-22%, LICA 48-25%.   Cataract    CLL (chronic lymphocytic leukemia) (HCC)    GERD (gastroesophageal reflux disease)    Glaucoma    H/O: hysterectomy    Total   History of stress test    a. 02/2019 MV: EF 60%, no ischemia/infarct.   Hyperlipidemia    Hypertension    Hypothyroidism    Impaired fasting glucose    Lichen sclerosus    Osteoporosis    Hips   PAF (paroxysmal atrial fibrillation) ([place])    a. 06/15/2016 Event monitor: 4% afib burden; b. CHA2DS2VASc - 4-->Xarelto.   Sinus bradycardia    a. avoid AVN blocking agents.   Syncope    a. 03/2016 Echo: EF 55-60%, no rwma, mild AI/MR, nl PASP; b. 03/2016 48h Holter: no significant arrhythmias/pauses; c. 03/2016 MV: mild apical defect, likely breast attenuation, nl EF, low risk; d. 05/2016 Event monitor: No significant  arrhythmia; e. 05/2016 Event monitor: PAF (4%).   Past Surgical History:  Procedure Laterality Date   APPENDECTOMY     EYE SURGERY     Glaucoma   MULTIPLE TOOTH EXTRACTIONS N/A    2 teeth   PACEMAKER IMPLANT N/A 09/22/2021   Procedure: PACEMAKER IMPLANT;  Surgeon: Deboraha Sprang, MD;  Location: [facility];  Service: Cardiovascular;  Laterality: N/A;   TOTAL ABDOMINAL HYSTERECTOMY     Patient Active Problem List   Diagnosis Date Noted   HFrEF (heart failure with reduced ejection fraction) ([place]) 05/06/2022   Sick sinus syndrome ([place]) 05/06/2022   General weakness 02/01/2022   Arm pain, anterior, right 01/30/2022   SIADH (syndrome of inappropriate ADH production) ([place]) 07/05/2021   Aspiration pneumonia ([place]) 05/21/2021   Elevated troponin 05/20/2021   Fall 02/11/2021   Normocytic anemia 05/31/2020   Goals of care, counseling/discussion 03/08/2020   Positive direct antiglobulin test (DAT) 12/04/2019   Sinus bradycardia 04/09/2019   First degree AV block 04/09/2019   Osteoporosis 08/21/2018   Bilateral carotid artery stenosis 04/19/2018   History of syncope 10/17/2017   MCI (mild cognitive impairment) 08/12/2017   Lung nodules 07/09/2017   Orthostatic lightheadedness 04/18/2017   Paroxysmal atrial fibrillation ([place]) 04/18/2017   Hyponatremia 10/20/2016  Chronic diastolic congestive heart failure ([place]) 06/28/2016   Syncope, near 05/24/2016   Primary open-angle glaucoma, bilateral, mild stage 05/08/2016   Bladder prolapse, female, acquired 02/25/2016   Asymptomatic bacteriuria 02/25/2016   B12 deficiency 01/11/2016   Anemia 01/10/2016   CLL (chronic lymphocytic leukemia) ([place]) 01/10/2016   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 12/21/2015   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 12/21/2015   Bradycardia 12/21/2015   Lymphocytosis 06/25/2015   Impaired fasting glucose 06/22/2015   Carotid atherosclerosis 06/22/2015   Essential hypertension 12/22/2014    Hyperlipidemia 12/22/2014   Hypothyroidism 12/22/2014    ONSET DATE: 01/30/2022  REFERRING DIAG: R26.9 (ICD-10-CM) - Abnormality of gait and mobility  THERAPY DIAG:  Difficulty in walking, not elsewhere classified  Muscle weakness (generalized)  Unsteadiness on feet  Rationale for Evaluation and Treatment Rehabilitation  SUBJECTIVE:   SUBJECTIVE STATEMENT:  Pt reports she is ready for goal assessment and is hoping that she has made some improvements.     [name]: family member [name]  PERTINENT HISTORY: [name] back in 2022 for weakness, imbalance. She returns for generalized weakness, balance issues and falls following hospitalization 01/30/22-01/31/22 for acute on chronic hyponatremia, near syncope, RUE pain and generalized weakness. Pt biggest concern is her balance. 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   FALLS: Has patient fallen in last 6 months? Yes. Number of falls 1  LIVING ENVIRONMENT: Lives with: lives with their family and lives alone, but daughter lives next door Lives in: House/apartment Stairs:  pt has a ramp, but one step from back porch into the house (pt grabs onto shelves on R hand side) Has following equipment at home: Single point cane, Walker - 4 wheeled, Wheelchair (manual), shower chair, bed side commode, Grab bars, and Ramped entry  PLOF: Independent  PATIENT GOALS: "I want more balance"     OBJECTIVE:   DIAGNOSTIC FINDINGS:   CT head WO contrast (5MM) 8/7/20203 per chart "IMPRESSION: No evidence of acute intracranial abnormality."  COGNITION: Overall cognitive status: History of cognitive impairments - at baseline per pt's daughter, mild cog impairment per chart   SENSATION: Pt daughter reports usually LLE impaired vs. RLE.  Pt able to feel light touch to BLE on exam  COORDINATION: WNL BUE and BLE   EDEMA:  Dtr reports swelling in LLE compared to RLE (reports chronic issue)   POSTURE: rounded shoulders, forward head, and increased thoracic kyphosis    LOWER EXTREMITY MMT:    Grossly 4+/5 BLE exception hip flexors 4-/5 BLE   TRANSFERS: Assistive device utilized: Single point cane  Sit to stand: Modified independence Stand to sit: Modified independence Chair to chair: Modified independence   GAIT: Gait pattern: step to pattern, decreased arm swing- Left, decreased stride length, and narrow BOS, dec B hip ext throughout gait cycle Distance walked: 10 m Assistive device utilized: Single point cane Level of assistance:  Close CGA, pt reports fear of falls/unsteadiness observed   FUNCTIONAL TESTs:  5 times sit to stand: 28 seconds  Timed up and go (TUG): 32 sec  with SPC 10 meter walk test: 0.45 m/s with SPC close CGA due to unsteadiness  Berg Balance Scale: deferred   PATIENT SURVEYS:  FOTO 38 (goal score 47)  TODAY'S TREATMENT:  Goal assessment performed as noted below:    PATIENT EDUCATION: Education details: Pt educated throughout session about proper posture and technique with exercises. Improved exercise technique, movement at target joints, use of target muscles after min to mod verbal, visual, tactile cues.  Person educated: Patient and Child(ren) (adult daughter) Education method: Explanation, Demonstration, Tactile cues, and Verbal cues Education comprehension: verbalized understanding, returned demonstration, verbal cues required,  tactile cues required, and needs further education   HOME EXERCISE PROGRAM: No updates today, pt to continue plan as previously indicated Access Code: [email or web address] URL: [email or web address] Date: 04/03/2022 Prepared by: Ricard Dillon  Exercises - Seated March  - 1 x daily - 5 x weekly - 3 sets - 20 reps - alternating leg hold    GOALS: Goals reviewed with patient? Yes  SHORT TERM GOALS: Target date: 07/06/2022  Patient will be independent in home exercise program to improve strength/mobility for better functional independence with ADLs. Baseline: initiated  Goal status: INITIAL    LONG TERM GOALS: Target date: 08/17/2022  Patient will increase FOTO score to equal to or greater than  47   to demonstrate statistically significant improvement in mobility and quality of life.  Baseline: 38 05/25/22: 42 Goal status: IN PROGRESS  2.  Patient (> 28 years old) will complete five times sit to stand test in < 15 seconds indicating an increased LE strength and improved balance. Baseline: 28 sec use of UE 05/25/22: 19.57 sec use of UE's on knees Goal status: IN PROGRESS  3. Patient will increase 10 meter walk test to >1.88ms as to improve gait speed for better community ambulation and to reduce fall risk.  Baseline: 0.45 m/s w SPC, close CGA 05/25/22: 0.56 m/s w SPC, close CGA Goal status: IN PROGRESS  4.   Patient will reduce timed up and go to <15 seconds to reduce fall risk and demonstrate improved transfer/gait ability. Baseline: 32 sec with SPC 05/25/22: 29.19 sec with SPC Goal status: IN PROGRESS  5.   Patient will increase Berg Balance score by > 6 points to demonstrate decreased fall risk during functional activities. Baseline: to be completed next 1-2 sessions; 10/18: 32/56 05/25/22:  37/56 Goal status: IN PROGRESS   ASSESSMENT:  CLINICAL IMPRESSION:  Pt has made good progress towards goals established at the initial evaluation.  Pt and daughter pleased  with results and note that she will continue with HEP to keep progressing towards goals in order to achieve greater balance and endurance with tasks.  Patient's condition has the potential to improve in response to therapy. Maximum improvement is yet to be obtained. The anticipated improvement is attainable and reasonable in a generally predictable time.   Pt will continue to benefit from skilled therapy to address remaining deficits in order to improve overall QoL and return to PLOF.       OBJECTIVE IMPAIRMENTS Abnormal gait, decreased activity tolerance, decreased balance, decreased cognition, decreased endurance, decreased knowledge of use of DME, decreased mobility, difficulty walking, decreased strength, impaired vision/preception, improper body mechanics, postural dysfunction, and pain.   ACTIVITY LIMITATIONS squatting, stairs, transfers, and locomotion level  PARTICIPATION LIMITATIONS: meal prep, cleaning, laundry, shopping, community activity, and yard work  PERSONAL FACTORS Age, Fitness, Past/current experiences, Sex, and 3+ comorbidities: Per chart PMH significant for hypertension, hyperlipidemia, chronic lymphocytic leukemia,  chronic hyponatremia and SIADH, generalized weakness, hypothyroidism, carotid atherosclerosis, lymphocytosis, neoplasm of uncertain behavior of skin of ear and nose, B12 deficiency, bladder prolapse, glaucoma, Chronic diastolic congestive heart failure, paroxysmal atria fibrillation, lung nodules, mild cognitive impairment, osteoporosis, first degree AV block, normocytic anemia, falls,   are also affecting patient's functional outcome.   REHAB POTENTIAL: Good  CLINICAL DECISION MAKING: Evolving/moderate complexity  EVALUATION COMPLEXITY: Moderate  PLAN: PT FREQUENCY: 2x/week  PT DURATION: 12 weeks  PLANNED INTERVENTIONS: Therapeutic exercises, Therapeutic activity, Neuromuscular re-education, Balance training, Gait training, Patient/Family education, Self  Care, Joint mobilization, Stair training, Vestibular training, Canalith repositioning, Orthotic/Fit training, DME instructions, Wheelchair mobility training, Spinal mobilization, Cryotherapy, Moist heat, scar mobilization, Splintting, Taping, Ultrasound, Parrafin, Biofeedback, Manual therapy, and Re-evaluation  PLAN FOR NEXT SESSION:   Strength, balance, SPC gait training, continue plan   Gwenlyn Saran, PT, DPT Physical Therapist- [facility]  05/25/22, 4:09 PM

## 2022-05-25 ENCOUNTER — Ambulatory Visit: Payer: Medicare Other

## 2022-05-25 DIAGNOSIS — M6281 Muscle weakness (generalized): Secondary | ICD-10-CM

## 2022-05-25 DIAGNOSIS — R2681 Unsteadiness on feet: Secondary | ICD-10-CM

## 2022-05-25 DIAGNOSIS — R262 Difficulty in walking, not elsewhere classified: Secondary | ICD-10-CM

## 2022-05-25 DIAGNOSIS — R1312 Dysphagia, oropharyngeal phase: Secondary | ICD-10-CM | POA: Diagnosis not present

## 2022-05-30 ENCOUNTER — Ambulatory Visit: Payer: Medicare Other | Attending: Internal Medicine

## 2022-05-30 DIAGNOSIS — R262 Difficulty in walking, not elsewhere classified: Secondary | ICD-10-CM | POA: Diagnosis not present

## 2022-05-30 DIAGNOSIS — R1312 Dysphagia, oropharyngeal phase: Secondary | ICD-10-CM | POA: Diagnosis not present

## 2022-05-30 DIAGNOSIS — R2681 Unsteadiness on feet: Secondary | ICD-10-CM | POA: Insufficient documentation

## 2022-05-30 DIAGNOSIS — M6281 Muscle weakness (generalized): Secondary | ICD-10-CM | POA: Diagnosis not present

## 2022-05-30 NOTE — Therapy (Signed)
OUTPATIENT PHYSICAL THERAPY NEURO TREATMENT NOTE  Patient Name: Sydney Mcdaniel MRN: [id number] DOB:1925-06-13, 88 y.o., female Today's Date: 05/30/2022  PCP: Teodora Medici, DO REFERRING PROVIDER: Teodora Medici, DO    PT End of Session - 05/30/22 1150     Visit Number 11    Number of Visits 25    Date for PT Re-Evaluation 06/26/22    Authorization Time Period 04/03/22-06/26/2022    Progress Note Due on Visit 10    PT Start Time 1147    PT Stop Time 1230    PT Time Calculation (min) 43 min    Equipment Utilized During Treatment Gait belt    Activity Tolerance Patient tolerated treatment well    Behavior During Therapy WFL for tasks assessed/performed              Past Medical History:  Diagnosis Date   Allergy    Anemia    Cardiomyopathy ([place])    a. 02/2019 Echo: EF 60-65%, no rwma, doppler parameters consistent w/ pseudonormalization. Nl RV size/fxn; b. 06/2021 Echo: EF 40-45% w/ sev basal-mid antsept HK. GrII DD. Nl RV size/fxn. Mild MR/AI.   Carotid arterial disease ([place])    a. 04/2020 Carotid U/S: 1-39% bilat ICA stenosis, <50% bilat ECA stenosis; b. 06/2021 Carotid U/S: RICA 8-46%, LICA 65-99%.   Cataract    CLL (chronic lymphocytic leukemia) (HCC)    GERD (gastroesophageal reflux disease)    Glaucoma    H/O: hysterectomy    Total   History of stress test    a. 02/2019 MV: EF 60%, no ischemia/infarct.   Hyperlipidemia    Hypertension    Hypothyroidism    Impaired fasting glucose    Lichen sclerosus    Osteoporosis    Hips   PAF (paroxysmal atrial fibrillation) ([place])    a. 06/15/2016 Event monitor: 4% afib burden; b. CHA2DS2VASc - 4-->Xarelto.   Sinus bradycardia    a. avoid AVN blocking agents.   Syncope    a. 03/2016 Echo: EF 55-60%, no rwma, mild AI/MR, nl PASP; b. 03/2016 48h Holter: no significant arrhythmias/pauses; c. 03/2016 MV: mild apical defect, likely breast attenuation, nl EF, low risk; d. 05/2016 Event monitor: No significant arrhythmia;  e. 05/2016 Event monitor: PAF (4%).   Past Surgical History:  Procedure Laterality Date   APPENDECTOMY     EYE SURGERY     Glaucoma   MULTIPLE TOOTH EXTRACTIONS N/A    2 teeth   PACEMAKER IMPLANT N/A 09/22/2021   Procedure: PACEMAKER IMPLANT;  Surgeon: Deboraha Sprang, MD;  Location: [facility];  Service: Cardiovascular;  Laterality: N/A;   TOTAL ABDOMINAL HYSTERECTOMY     Patient Active Problem List   Diagnosis Date Noted   HFrEF (heart failure with reduced ejection fraction) ([place]) 05/06/2022   Sick sinus syndrome ([place]) 05/06/2022   General weakness 02/01/2022   Arm pain, anterior, right 01/30/2022   SIADH (syndrome of inappropriate ADH production) ([place]) 07/05/2021   Aspiration pneumonia ([place]) 05/21/2021   Elevated troponin 05/20/2021   Fall 02/11/2021   Normocytic anemia 05/31/2020   Goals of care, counseling/discussion 03/08/2020   Positive direct antiglobulin test (DAT) 12/04/2019   Sinus bradycardia 04/09/2019   First degree AV block 04/09/2019   Osteoporosis 08/21/2018   Bilateral carotid artery stenosis 04/19/2018   History of syncope 10/17/2017   MCI (mild cognitive impairment) 08/12/2017   Lung nodules 07/09/2017   Orthostatic lightheadedness 04/18/2017   Paroxysmal atrial fibrillation ([place]) 04/18/2017   Hyponatremia 10/20/2016  Chronic diastolic congestive heart failure ([place]) 06/28/2016   Syncope, near 05/24/2016   Primary open-angle glaucoma, bilateral, mild stage 05/08/2016   Bladder prolapse, female, acquired 02/25/2016   Asymptomatic bacteriuria 02/25/2016   B12 deficiency 01/11/2016   Anemia 01/10/2016   CLL (chronic lymphocytic leukemia) ([place]) 01/10/2016   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 12/21/2015   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 12/21/2015   Bradycardia 12/21/2015   Lymphocytosis 06/25/2015   Impaired fasting glucose 06/22/2015   Carotid atherosclerosis 06/22/2015   Essential hypertension 12/22/2014    Hyperlipidemia 12/22/2014   Hypothyroidism 12/22/2014    ONSET DATE: 01/30/2022  REFERRING DIAG: R26.9 (ICD-10-CM) - Abnormality of gait and mobility  THERAPY DIAG:  Difficulty in walking, not elsewhere classified  Muscle weakness (generalized)  Unsteadiness on feet  Dysphagia, oropharyngeal phase  Rationale for Evaluation and Treatment Rehabilitation  SUBJECTIVE:   SUBJECTIVE STATEMENT:  Pt had another incident of being woozy on Sunday.    [name]: family member [name]  PERTINENT HISTORY: [name] back in 2022 for weakness, imbalance. She returns for generalized weakness, balance issues and falls following hospitalization 01/30/22-01/31/22 for acute on chronic hyponatremia, near syncope, RUE pain and generalized weakness. Pt biggest concern is her balance. 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   FALLS: Has patient fallen in last 6 months? Yes. Number of falls 1  LIVING ENVIRONMENT: Lives with: lives with their family and lives alone, but daughter lives next door Lives in: House/apartment Stairs:  pt has a ramp, but one step from back porch into the house (pt grabs onto shelves on R hand side) Has following equipment at home: Single point cane, Walker - 4 wheeled, Wheelchair (manual), shower chair, bed side commode, Grab bars, and Ramped entry  PLOF: Independent  PATIENT GOALS: "I want more balance"    OBJECTIVE:    DIAGNOSTIC FINDINGS:   CT head WO contrast (5MM) 8/7/20203 per chart "IMPRESSION: No evidence of acute intracranial abnormality."  COGNITION: Overall cognitive status: History of cognitive impairments - at baseline per pt's daughter, mild cog impairment per chart   SENSATION: Pt daughter reports usually LLE impaired vs. RLE.  Pt able to feel light touch to BLE on exam  COORDINATION: WNL BUE and BLE   EDEMA:  Dtr reports swelling in LLE compared to RLE (reports chronic issue)   POSTURE: rounded shoulders, forward head, and increased thoracic kyphosis    LOWER EXTREMITY MMT:    Grossly 4+/5 BLE exception hip flexors 4-/5 BLE   TRANSFERS: Assistive device utilized: Single point cane  Sit to stand: Modified independence Stand to sit: Modified independence Chair to chair: Modified independence   GAIT: Gait pattern: step to pattern, decreased arm swing- Left, decreased stride length, and narrow BOS, dec B hip ext throughout gait cycle Distance walked: 10 m Assistive device utilized: Single point cane Level of assistance:  Close CGA, pt reports fear of falls/unsteadiness observed   FUNCTIONAL TESTs:  5 times sit to stand: 28 seconds  Timed up and go (TUG): 32 sec with SPC 10 meter walk  test: 0.45 m/s with SPC close CGA due to unsteadiness  Berg Balance Scale: deferred   PATIENT SURVEYS:  FOTO 38 (goal score 47)  TODAY'S TREATMENT:  BP: 126/54 mmHg HR: 60   Gait belt donned and CGA provided unless specified otherwise   There.ex:    Seated hamstring curls with BTB resistance, x10 each LE   STS x10 hands-free, continues to require initial cuing for technique    Neuro:  2x155 ft amb for endurance with no AD and close CGA, continued difficulty with turns and wanting to walk towards the wall on the R side  Blazepod Focus activity with 4 pods utilized, multiple attempts for 60 sec stints with 15 sec rest periods, all while standing with CGA for safety, x4  bouts total.    PATIENT EDUCATION: Education details: Pt educated throughout session about proper posture and technique with exercises. Improved exercise technique, movement at target joints, use of target muscles after min to mod verbal, visual, tactile cues.  Person educated: Patient and Child(ren) (adult daughter) Education method: Explanation, Demonstration, Tactile cues, and Verbal cues Education comprehension: verbalized understanding, returned demonstration, verbal cues required, tactile cues required, and needs further education   HOME EXERCISE PROGRAM: No updates today, pt to continue plan as previously indicated Access Code: [email or web address] URL: [email or web address] Date: 04/03/2022 Prepared by: Ricard Dillon  Exercises - Seated March  - 1 x daily - 5 x weekly - 3 sets - 20 reps - alternating leg hold    GOALS: Goals reviewed with patient? Yes  SHORT TERM GOALS: Target date: 07/11/2022  Patient will be independent in home exercise program to improve strength/mobility for better functional independence with ADLs. Baseline: initiated  Goal status: INITIAL    LONG TERM GOALS: Target date: 08/22/2022  Patient will increase FOTO score to equal to or greater than  47   to demonstrate statistically significant improvement in mobility and quality of life.  Baseline: 38 05/25/22: 42 Goal status: IN PROGRESS  2.  Patient (> 59 years old) will complete five times sit to stand test in < 15 seconds indicating an increased LE strength and improved balance. Baseline: 28 sec use of UE 05/25/22: 19.57 sec use of UE's on knees Goal status: IN PROGRESS  3. Patient will increase 10 meter walk test to >1.60ms as to improve gait speed for better community ambulation and to reduce fall risk.  Baseline: 0.45 m/s w SPC, close CGA 05/25/22: 0.56 m/s w SPC, close CGA Goal status: IN PROGRESS  4.   Patient will reduce timed up and go to <15 seconds to reduce fall risk and  demonstrate improved transfer/gait ability. Baseline: 32 sec with SPC 05/25/22: 29.19 sec with SPC Goal status: IN PROGRESS  5.   Patient will increase Berg Balance score by > 6 points to demonstrate decreased fall risk during functional activities. Baseline: to be completed next 1-2 sessions; 10/18: 32/56 05/25/22:  37/56 Goal status: IN PROGRESS   ASSESSMENT:  CLINICAL IMPRESSION:  Pt put forth great effort throughout the session today and is making good progress towards goals.  Pt enjoyed participating in balance related tasks that included the blazepods.  Pt found it challenging and fun throughout the time using.  Pt will continue to benefit from continued strengthening and balance related exercises in order to maximize potential for return to PLOF and improve overall QoL.      OBJECTIVE IMPAIRMENTS Abnormal gait, decreased activity tolerance, decreased balance, decreased cognition, decreased endurance, decreased knowledge of use of DME,  decreased mobility, difficulty walking, decreased strength, impaired vision/preception, improper body mechanics, postural dysfunction, and pain.   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   REHAB POTENTIAL:  Good  CLINICAL DECISION MAKING: Evolving/moderate complexity  EVALUATION COMPLEXITY: Moderate  PLAN: PT FREQUENCY: 2x/week  PT DURATION: 12 weeks  PLANNED INTERVENTIONS: Therapeutic exercises, Therapeutic activity, Neuromuscular re-education, Balance training, Gait training, Patient/Family education, Self Care, Joint mobilization, Stair training, Vestibular training, Canalith repositioning, Orthotic/Fit training, DME instructions, Wheelchair mobility training, Spinal mobilization, Cryotherapy, Moist heat, scar mobilization, Splintting, Taping, Ultrasound, Parrafin, Biofeedback, Manual therapy, and Re-evaluation  PLAN FOR NEXT SESSION:   Strength, balance, SPC gait training, continue plan   Gwenlyn Saran, PT, DPT Physical Therapist- [facility]  05/30/22, 3:56 PM

## 2022-06-02 DIAGNOSIS — H01009 Unspecified blepharitis unspecified eye, unspecified eyelid: Secondary | ICD-10-CM | POA: Diagnosis not present

## 2022-06-02 DIAGNOSIS — H01004 Unspecified blepharitis left upper eyelid: Secondary | ICD-10-CM | POA: Diagnosis not present

## 2022-06-02 DIAGNOSIS — H1820 Unspecified corneal edema: Secondary | ICD-10-CM | POA: Diagnosis not present

## 2022-06-02 DIAGNOSIS — H04129 Dry eye syndrome of unspecified lacrimal gland: Secondary | ICD-10-CM | POA: Diagnosis not present

## 2022-06-04 ENCOUNTER — Encounter: Payer: Self-pay | Admitting: Emergency Medicine

## 2022-06-04 ENCOUNTER — Ambulatory Visit
Admission: EM | Admit: 2022-06-04 | Discharge: 2022-06-04 | Disposition: A | Discharge status: Home / Self Care | Payer: Medicare Other | Attending: Physician Assistant | Admitting: Physician Assistant

## 2022-06-04 DIAGNOSIS — U071 COVID-19: Secondary | ICD-10-CM | POA: Diagnosis not present

## 2022-06-04 DIAGNOSIS — J069 Acute upper respiratory infection, unspecified: Secondary | ICD-10-CM | POA: Insufficient documentation

## 2022-06-04 DIAGNOSIS — R490 Dysphonia: Secondary | ICD-10-CM | POA: Diagnosis not present

## 2022-06-04 LAB — RESP PANEL BY RT-PCR (RSV, FLU A&B, COVID)  RVPGX2
Influenza A by PCR: NEGATIVE
Influenza B by PCR: NEGATIVE
Resp Syncytial Virus by PCR: NEGATIVE
SARS Coronavirus 2 by RT PCR: POSITIVE — AB

## 2022-06-04 MED ORDER — MOLNUPIRAVIR 200 MG PO CAPS: 4.0000 | ORAL_CAPSULE | Freq: Two times a day (BID) | ORAL | 0 refills | Status: AC

## 2022-06-04 NOTE — Discharge Instructions (Addendum)
-  I will call with the results of the viral panel.  Suspect viral illness for her.  Could be COVID, flu, RSV or other virus.  I will send antiviral medication if she has COVID or flu.  If she has COVID she is isolate 5 days and wear mask x 5 days.  If she has RSV, the treatment is supportive.  We discussed Coricidin HBP for cough.  Nasal saline and plenty rest and fluids.  If she has any uncontrolled fever, weakness, breathing difficulty she needs to be seen again right away.

## 2022-06-04 NOTE — ED Provider Notes (Signed)
[facility]    CSN: [id number] Arrival date & time: 06/04/22  1528      History   Chief Complaint Chief Complaint  Patient presents with   Cough    HPI Sydney Mcdaniel is a 88 y.o. female presenting for cough and congestion that began 2 days ago.  She denies fever.  She has been somewhat fatigued.  She is complaining of sore throat and postnasal drainage.  No sinus pain, ear pain, chest pain, breathing difficulty, vomiting or diarrhea.  Her daughter has been sick with similar symptoms and was sick before her.  Patient has not been taking anything other than Tylenol for her symptoms.  Patient's daughter states that she has had blood pressure problems recently and has an appointment scheduled to see her PCP for possible medication change.  Patient has had all COVID vaccines and influenza vaccine.  No other complaints.  HPI  Past Medical History:  Diagnosis Date   Allergy    Anemia    Cardiomyopathy ([place])    a. 02/2019 Echo: EF 60-65%, no rwma, doppler parameters consistent w/ pseudonormalization. Nl RV size/fxn; b. 06/2021 Echo: EF 40-45% w/ sev basal-mid antsept HK. GrII DD. Nl RV size/fxn. Mild MR/AI.   Carotid arterial disease ([place])    a. 04/2020 Carotid U/S: 1-39% bilat ICA stenosis, <50% bilat ECA stenosis; b. 06/2021 Carotid U/S: RICA 1-82%, LICA 99-37%.   Cataract    CLL (chronic lymphocytic leukemia) (HCC)    GERD (gastroesophageal reflux disease)    Glaucoma    H/O: hysterectomy    Total   History of stress test    a. 02/2019 MV: EF 60%, no ischemia/infarct.   Hyperlipidemia    Hypertension    Hypothyroidism    Impaired fasting glucose    Lichen sclerosus    Osteoporosis    Hips   PAF (paroxysmal atrial fibrillation) ([place])    a. 06/15/2016 Event monitor: 4% afib burden; b. CHA2DS2VASc - 4-->Xarelto.   Sinus bradycardia    a. avoid AVN blocking agents.   Syncope    a. 03/2016 Echo: EF 55-60%, no rwma, mild AI/MR, nl PASP; b. 03/2016 48h Holter: no  significant arrhythmias/pauses; c. 03/2016 MV: mild apical defect, likely breast attenuation, nl EF, low risk; d. 05/2016 Event monitor: No significant arrhythmia; e. 05/2016 Event monitor: PAF (4%).    Patient Active Problem List   Diagnosis Date Noted   HFrEF (heart failure with reduced ejection fraction) ([place]) 05/06/2022   Sick sinus syndrome ([place]) 05/06/2022   General weakness 02/01/2022   Arm pain, anterior, right 01/30/2022   SIADH (syndrome of inappropriate ADH production) ([place]) 07/05/2021   Aspiration pneumonia ([place]) 05/21/2021   Elevated troponin 05/20/2021   Fall 02/11/2021   Normocytic anemia 05/31/2020   Goals of care, counseling/discussion 03/08/2020   Positive direct antiglobulin test (DAT) 12/04/2019   Sinus bradycardia 04/09/2019   First degree AV block 04/09/2019   Osteoporosis 08/21/2018   Bilateral carotid artery stenosis 04/19/2018   History of syncope 10/17/2017   MCI (mild cognitive impairment) 08/12/2017   Lung nodules 07/09/2017   Orthostatic lightheadedness 04/18/2017   Paroxysmal atrial fibrillation ([place]) 04/18/2017   Hyponatremia 10/20/2016   Chronic diastolic congestive heart failure ([place]) 06/28/2016   Syncope, near 05/24/2016   Primary open-angle glaucoma, bilateral, mild stage 05/08/2016   Bladder prolapse, female, acquired 02/25/2016   Asymptomatic bacteriuria 02/25/2016   B12 deficiency 01/11/2016   Anemia 01/10/2016   CLL (chronic lymphocytic leukemia) ([place]) 01/10/2016   Neoplasm  of uncertain behavior of skin of ear 12/21/2015   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 12/21/2015   Bradycardia 12/21/2015   Lymphocytosis 06/25/2015   Impaired fasting glucose 06/22/2015   Carotid atherosclerosis 06/22/2015   Essential hypertension 12/22/2014   Hyperlipidemia 12/22/2014   Hypothyroidism 12/22/2014    Past Surgical History:  Procedure Laterality Date   APPENDECTOMY     EYE SURGERY     Glaucoma   MULTIPLE TOOTH EXTRACTIONS N/A    2  teeth   PACEMAKER IMPLANT N/A 09/22/2021   Procedure: PACEMAKER IMPLANT;  Surgeon: Deboraha Sprang, MD;  Location: [facility] [facility];  Service: Cardiovascular;  Laterality: N/A;   TOTAL ABDOMINAL HYSTERECTOMY      OB History   No obstetric history on file.      Home Medications    Prior to Admission medications   Medication Sig Start Date End Date Taking? Authorizing Provider  amLODipine (NORVASC) 5 MG tablet Take 0.5 tablets (2.5 mg total) by mouth daily. 05/22/22   End, Harrell Gave, MD  molnupiravir EUA (LAGEVRIO) 200 MG CAPS capsule Take 4 capsules (800 mg total) by mouth 2 (two) times daily for 5 days. 06/04/22 06/09/22 Yes Danton Clap, PA-C  acetaminophen (TYLENOL) 500 MG tablet Take 500 mg by mouth every 8 (eight) hours as needed for moderate pain.    [provider]  alendronate (FOSAMAX) 70 MG tablet TAKE 1 TABLET BY MOUTH EVERY FRIDAY ON AN EMPTY STOMACH WITH A FULL GLASS OF WATER 05/09/22   Teodora Medici, DO  atorvastatin (LIPITOR) 20 MG tablet Take 1 tablet (20 mg total) by mouth every evening. 12/29/21   Teodora Medici, DO  Cholecalciferol 25 MCG (1000 UT) tablet Take by mouth.    [provider]  ferrous sulfate 325 (65 FE) MG tablet Take 2 tablets (650 mg total) by mouth daily with breakfast. 01/31/22   Jennye Boroughs, MD  latanoprost (XALATAN) 0.005 % ophthalmic solution Place 1 drop into the right eye at bedtime.  04/24/15   [provider]  levothyroxine (EUTHYROX) 75 MCG tablet TAKE ONE TABLET BY MOUTH ON MONDAYS, [place], FRIDAYS, AND SUNDAYS 05/09/22   Teodora Medici, DO  levothyroxine (SYNTHROID) 88 MCG tablet TAKE 1 TABLET BY MOUTH ON TUESDAYS, THURSDAYS AND SATURDAYS 05/09/22   Teodora Medici, DO  lisinopril (ZESTRIL) 40 MG tablet Take 1 tablet by mouth once daily 04/03/22   Theora Gianotti, NP  Netarsudil Dimesylate (RHOPRESSA) 0.02 % SOLN Place 1 drop into the right eye at bedtime.    [provider]   ondansetron (ZOFRAN) 8 MG tablet Take 8 mg by mouth as needed. 03/07/22   [provider]  polyethylene glycol (MIRALAX / GLYCOLAX) 17 g packet Take 17 g by mouth daily.    [provider]  Rivaroxaban (XARELTO) 15 MG TABS tablet TAKE 1 TABLET EVERY DAY WITH SUPPER 04/20/22   Theora Gianotti, NP  sodium chloride 1 g tablet Take 1 g by mouth. Takes 1/2 tablet with breakfast and lunch and 1 whole tablet with supper    [provider]  spironolactone (ALDACTONE) 25 MG tablet Take 25 mg by mouth daily.    [provider]  timolol (TIMOPTIC) 0.5 % ophthalmic solution Place 1 drop into the right eye 2 (two) times daily. Instill 1 drop in the morning and 1 drop at 1700 01/01/20   [provider]  vitamin B-12 (CYANOCOBALAMIN) 100 MCG tablet Take 100 mcg by mouth daily.  [provider]    Family History Family History  Problem Relation Age of Onset   Heart attack Mother    Glaucoma Mother    Heart attack Father    Parkinson's disease Brother    Heart attack Brother     Social History Social History   Tobacco Use   Smoking status: Never   Smokeless tobacco: Never  Vaping Use   Vaping Use: Never used  Substance Use Topics   Alcohol use: No   Drug use: No     Allergies   Alphagan [brimonidine] and Pravachol [pravastatin sodium]   Review of Systems Review of Systems  Constitutional:  Positive for fatigue. Negative for chills, diaphoresis and fever.  HENT:  Positive for congestion, postnasal drip, rhinorrhea and sore throat. Negative for ear pain, sinus pressure and sinus pain.   Respiratory:  Positive for cough. Negative for shortness of breath.   Gastrointestinal:  Negative for abdominal pain, nausea and vomiting.  Musculoskeletal:  Negative for arthralgias and myalgias.  Skin:  Negative for rash.  Neurological:  Negative for weakness and headaches.  Hematological:  Negative for adenopathy.     Physical  Exam Triage Vital Signs ED Triage Vitals  Enc Vitals Group     BP      Pulse      Resp      Temp      Temp src      SpO2      Weight      Height      Head Circumference      Peak Flow      Pain Score      Pain Loc      Pain Edu?      Excl. in [place]?    No data found.  Updated Vital Signs BP (!) 183/72 (BP Location: Right Arm)   Pulse 80   Temp 99.2 F (37.3 C) (Oral)   Resp 15   Ht '5\' 2"'$  (1.575 m)   Wt 135 lb 12.9 oz (61.6 kg)   LMP  (LMP Unknown)   SpO2 98%   BMI 24.84 kg/m    Physical Exam Vitals and nursing note reviewed.  Constitutional:      General: She is not in acute distress.    Appearance: Normal appearance. She is not ill-appearing or toxic-appearing.  HENT:     Head: Normocephalic and atraumatic.     Nose: Congestion present.     Mouth/Throat:     Mouth: Mucous membranes are moist.     Pharynx: Oropharynx is clear.  Eyes:     General: No scleral icterus.       Right eye: No discharge.        Left eye: No discharge.     Conjunctiva/sclera: Conjunctivae normal.  Cardiovascular:     Rate and Rhythm: Normal rate and regular rhythm.     Heart sounds: Normal heart sounds.  Pulmonary:     Effort: Pulmonary effort is normal. No respiratory distress.     Breath sounds: Normal breath sounds.  Musculoskeletal:     Cervical back: Neck supple.  Skin:    General: Skin is dry.  Neurological:     General: No focal deficit present.     Mental Status: She is alert. Mental status is at baseline.     Motor: No weakness.     Gait: Gait normal.  Psychiatric:        Mood and Affect: Mood normal.  Behavior: Behavior normal.        Thought Content: Thought content normal.      UC Treatments / Results  Labs (all labs ordered are listed, but only abnormal results are displayed) Labs Reviewed  RESP PANEL BY RT-PCR (RSV, FLU A&B, COVID)  [id number] - Abnormal; Notable for the following components:      Result Value   SARS Coronavirus 2 by RT PCR POSITIVE  (*)    All other components within normal limits    EKG   Radiology No results found.  Procedures Procedures (including critical care time)  Medications Ordered in UC Medications - No data to display  Initial Impression / Assessment and Plan / UC Course  I have reviewed the triage vital signs and the nursing notes.  Pertinent labs & imaging results that were available during my care of the patient were reviewed by me and considered in my medical decision making (see chart for details).   88 year old female presents for cough, congestion, sore throat, postnasal drainage and fatigue x 2 days.  No fever, breathing difficulty, vomiting or diarrhea.  Exposed to her daughter who was recently sick with a cough and congestion as well.  She is afebrile and overall well-appearing.  No acute distress.  On exam she has nasal congestion.  Also has clear postnasal drainage.  Throat is not erythematous.  Chest clear to auscultation and heart regular rate and rhythm.  Respiratory panel obtained for flu, COVID and RSV.  Advised patient's daughter that I would contact her with the results.  We discussed treatment for these viruses if positive.  Will consider sending antiviral medication for flu or COVID.  Discussed treatment is supportive for RSV and other viral illnesses.  Advised Coricidin HBP, rest and fluids.  Reviewed return as needed.  Emergency department for any uncontrolled fever, weakness or breathing trouble.  Positive respiratory panel for COVID-19.  Call discussed result with patient and her daughter.  Will treat with molnupiravir at this time given medication interactions.  Reiterated the current CDC guidelines, isolation protocol and ED precautions for COVID.  Thoroughly discussed taking to ER if any symptoms acutely worsen.  Final Clinical Impressions(s) / UC Diagnoses   Final diagnoses:  Viral URI with cough  Voice hoarseness  COVID-19     Discharge Instructions      -I will  call with the results of the viral panel.  Suspect viral illness for her.  Could be COVID, flu, RSV or other virus.  I will send antiviral medication if she has COVID or flu.  If she has COVID she is isolate 5 days and wear mask x 5 days.  If she has RSV, the treatment is supportive.  We discussed Coricidin HBP for cough.  Nasal saline and plenty rest and fluids.  If she has any uncontrolled fever, weakness, breathing difficulty she needs to be seen again right away.     ED Prescriptions     Medication Sig Dispense Auth. Provider   molnupiravir EUA (LAGEVRIO) 200 MG CAPS capsule Take 4 capsules (800 mg total) by mouth 2 (two) times daily for 5 days. 40 capsule Danton Clap, PA-C      PDMP not reviewed this encounter.   Danton Clap, PA-C 06/04/22 1626

## 2022-06-04 NOTE — ED Triage Notes (Signed)
Patient c/o cough and chest congestion that started on Friday.  Patient denies fevers.

## 2022-06-06 ENCOUNTER — Ambulatory Visit: Payer: Medicare Other

## 2022-06-08 ENCOUNTER — Ambulatory Visit: Payer: Medicare Other | Admitting: Nurse Practitioner

## 2022-06-08 ENCOUNTER — Ambulatory Visit: Payer: Medicare Other

## 2022-06-12 ENCOUNTER — Ambulatory Visit: Payer: Self-pay | Admitting: *Deleted

## 2022-06-12 NOTE — Patient Outreach (Signed)
  Care Coordination   Follow Up Visit Note   06/12/2022 Name: Sydney Mcdaniel MRN: [id number] DOB: 10/10/1924  Sydney Mcdaniel is a 88 y.o. year old female who sees Teodora Medici, DO for primary care. I spoke with Sydney Mcdaniel, daughter of Sydney Mcdaniel by phone today.  What matters to the patients health and wellness today?  Was seen at [facility] on 12/10, diagnosed with Covid.    Goals Addressed             This Visit's Progress    Recover from Covid infection       Care Coordination Interventions: Provided education to patient to enhance basic understanding of COVID-19 as a viral disease, measures to prevent exposure, signs and symptoms, recommended vaccine schedule, when to contact provider Advised patient to discuss other options for ongoing recovery if not improving with provider Has not been to PT sessions due to Covid, will restart tomorrow Completed course of antiviral on Friday.  Educated on when to seek emergency medical attention if not improving Continues with productive cough, taking Coricidin HBP for relief      RNCM: Effective Management of health and well being   On track    Care Coordination Interventions: Evaluation of current treatment plan related to hyponatremia and hypertension and patient's adherence to plan as established by provider Advised patient to call the office for changes in conditions, new questions or concerns Provided education to patient re: safety precautions and falls prevention Reviewed medications with patient and discussed adherence and affordability Provided patient with emailed educational materials related to hypertension and Covid Reviewed scheduled/upcoming provider appointments including cardiology follow up on 12/29 and [facility] on 1/30 Discussed plans with patient for ongoing care management follow up and provided patient with direct contact information for care management team Advised patient to discuss changes in conditions,  questions, or concerns with provider Spoke to Sydney Mcdaniel, the patients daughter, BP now much better, was 142/66 yesterday           SDOH assessments and interventions completed:  No     Care Coordination Interventions:  Yes, provided   Follow up plan: Follow up call scheduled for 1/3    Encounter Outcome:  Pt. Visit Completed   Valente David, RN, MSN, [facility] Care Management Coordinator [phone]

## 2022-06-12 NOTE — Patient Instructions (Signed)
Visit Information  Thank you for taking time to visit with me today. Please don't hesitate to contact me if I can be of assistance to you before our next scheduled telephone appointment.  Following are the goals we discussed today:  Monitor patient for shortness of breath/difficulty breathing. Seek emergency medical attention with trouble breathing or chest pain.  Our next appointment is by telephone on 1/3  Please call the care guide team at [phone] if you need to cancel or reschedule your appointment.   Please call the Suicide and Crisis Lifeline: 988 call the [place] National Suicide Prevention Lifeline: (239)557-2985 or TTY: (619) 857-3324 TTY (479)863-2767) to talk to a trained counselor call 1-800-273-TALK (toll free, 24 hour hotline) call 911 if you are experiencing a Mental Health or [place] or need someone to talk to.  Patient verbalizes understanding of instructions and care plan provided today and agrees to view in [place]. Active MyChart status and patient understanding of how to access instructions and care plan via MyChart confirmed with patient.     The patient has been provided with contact information for the care management team and has been advised to call with any health related questions or concerns.   Valente David, RN, MSN, [facility] [facility] Care Management Coordinator [phone]

## 2022-06-13 ENCOUNTER — Ambulatory Visit: Payer: Medicare Other

## 2022-06-13 DIAGNOSIS — H18232 Secondary corneal edema, left eye: Secondary | ICD-10-CM | POA: Diagnosis not present

## 2022-06-13 DIAGNOSIS — H401132 Primary open-angle glaucoma, bilateral, moderate stage: Secondary | ICD-10-CM | POA: Diagnosis not present

## 2022-06-13 DIAGNOSIS — H04123 Dry eye syndrome of bilateral lacrimal glands: Secondary | ICD-10-CM | POA: Diagnosis not present

## 2022-06-15 ENCOUNTER — Ambulatory Visit: Payer: Medicare Other

## 2022-06-16 ENCOUNTER — Other Ambulatory Visit: Payer: Self-pay | Admitting: Nurse Practitioner

## 2022-06-20 NOTE — Therapy (Unsigned)
OUTPATIENT PHYSICAL THERAPY NEURO TREATMENT NOTE  Patient Name: Sydney Mcdaniel MRN: [id number] DOB:11-10-1924, 88 y.o., female Today's Date: 06/21/2022  PCP: Teodora Medici, DO REFERRING PROVIDER: Teodora Medici, DO    PT End of Session - 06/21/22 1048     Visit Number 12    Number of Visits 25    Date for PT Re-Evaluation 06/26/22    Authorization Time Period 04/03/22-06/26/2022    Progress Note Due on Visit 20    PT Start Time 1055    PT Stop Time 1138    PT Time Calculation (min) 43 min    Equipment Utilized During Treatment Gait belt    Activity Tolerance Patient tolerated treatment well    Behavior During Therapy WFL for tasks assessed/performed               Past Medical History:  Diagnosis Date   Allergy    Anemia    Cardiomyopathy ([place])    a. 02/2019 Echo: EF 60-65%, no rwma, doppler parameters consistent w/ pseudonormalization. Nl RV size/fxn; b. 06/2021 Echo: EF 40-45% w/ sev basal-mid antsept HK. GrII DD. Nl RV size/fxn. Mild MR/AI.   Carotid arterial disease ([place])    a. 04/2020 Carotid U/S: 1-39% bilat ICA stenosis, <50% bilat ECA stenosis; b. 06/2021 Carotid U/S: RICA 0-51%, LICA 10-21%.   Cataract    CLL (chronic lymphocytic leukemia) (HCC)    GERD (gastroesophageal reflux disease)    Glaucoma    H/O: hysterectomy    Total   History of stress test    a. 02/2019 MV: EF 60%, no ischemia/infarct.   Hyperlipidemia    Hypertension    Hypothyroidism    Impaired fasting glucose    Lichen sclerosus    Osteoporosis    Hips   PAF (paroxysmal atrial fibrillation) ([place])    a. 06/15/2016 Event monitor: 4% afib burden; b. CHA2DS2VASc - 4-->Xarelto.   Sinus bradycardia    a. avoid AVN blocking agents.   Syncope    a. 03/2016 Echo: EF 55-60%, no rwma, mild AI/MR, nl PASP; b. 03/2016 48h Holter: no significant arrhythmias/pauses; c. 03/2016 MV: mild apical defect, likely breast attenuation, nl EF, low risk; d. 05/2016 Event monitor: No significant  arrhythmia; e. 05/2016 Event monitor: PAF (4%).   Past Surgical History:  Procedure Laterality Date   APPENDECTOMY     EYE SURGERY     Glaucoma   MULTIPLE TOOTH EXTRACTIONS N/A    2 teeth   PACEMAKER IMPLANT N/A 09/22/2021   Procedure: PACEMAKER IMPLANT;  Surgeon: Deboraha Sprang, MD;  Location: [facility];  Service: Cardiovascular;  Laterality: N/A;   TOTAL ABDOMINAL HYSTERECTOMY     Patient Active Problem List   Diagnosis Date Noted   HFrEF (heart failure with reduced ejection fraction) ([place]) 05/06/2022   Sick sinus syndrome ([place]) 05/06/2022   General weakness 02/01/2022   Arm pain, anterior, right 01/30/2022   SIADH (syndrome of inappropriate ADH production) ([place]) 07/05/2021   Aspiration pneumonia ([place]) 05/21/2021   Elevated troponin 05/20/2021   Fall 02/11/2021   Normocytic anemia 05/31/2020   Goals of care, counseling/discussion 03/08/2020   Positive direct antiglobulin test (DAT) 12/04/2019   Sinus bradycardia 04/09/2019   First degree AV block 04/09/2019   Osteoporosis 08/21/2018   Bilateral carotid artery stenosis 04/19/2018   History of syncope 10/17/2017   MCI (mild cognitive impairment) 08/12/2017   Lung nodules 07/09/2017   Orthostatic lightheadedness 04/18/2017   Paroxysmal atrial fibrillation ([place]) 04/18/2017   Hyponatremia 10/20/2016  Chronic diastolic congestive heart failure ([place]) 06/28/2016   Syncope, near 05/24/2016   Primary open-angle glaucoma, bilateral, mild stage 05/08/2016   Bladder prolapse, female, acquired 02/25/2016   Asymptomatic bacteriuria 02/25/2016   B12 deficiency 01/11/2016   Anemia 01/10/2016   CLL (chronic lymphocytic leukemia) ([place]) 01/10/2016   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 12/21/2015   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 12/21/2015   Bradycardia 12/21/2015   Lymphocytosis 06/25/2015   Impaired fasting glucose 06/22/2015   Carotid atherosclerosis 06/22/2015   Essential hypertension 12/22/2014    Hyperlipidemia 12/22/2014   Hypothyroidism 12/22/2014    ONSET DATE: 01/30/2022  REFERRING DIAG: R26.9 (ICD-10-CM) - Abnormality of gait and mobility  THERAPY DIAG:  Difficulty in walking, not elsewhere classified  Muscle weakness (generalized)  Unsteadiness on feet  Rationale for Evaluation and Treatment Rehabilitation  SUBJECTIVE:   SUBJECTIVE STATEMENT:  Pt reports having COVID several weeks ago and was sick for a while. She took while to get better but finally felt good enough to go out for her hair done. Pt caregiver ( DTR) reports she has been more tired and fatigued since COVID.   [name]: family member [name]  PERTINENT HISTORY: [name] back in 2022 for weakness, imbalance. She returns for generalized weakness, balance issues and falls following hospitalization 01/30/22-01/31/22 for acute on chronic hyponatremia, near syncope, RUE pain and generalized weakness. Pt biggest concern is her balance. 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   FALLS: Has patient fallen in last 6 months? Yes. Number of falls 1  LIVING ENVIRONMENT: Lives with: lives with their family and lives alone, but daughter lives next door Lives in: House/apartment Stairs:  pt has a ramp, but one step from back porch into the house (pt grabs onto shelves on R hand side) Has following equipment at home: Single point cane, Walker - 4 wheeled, Wheelchair  (manual), shower chair, bed side commode, Grab bars, and Ramped entry  PLOF: Independent  PATIENT GOALS: "I want more balance"    OBJECTIVE:   DIAGNOSTIC FINDINGS:   CT head WO contrast (5MM) 8/7/20203 per chart "IMPRESSION: No evidence of acute intracranial abnormality."  COGNITION: Overall cognitive status: History of cognitive impairments - at baseline per pt's daughter, mild cog impairment per chart   SENSATION: Pt daughter reports usually LLE impaired vs. RLE.  Pt able to feel light touch to BLE on exam  COORDINATION: WNL BUE and BLE   EDEMA:  Dtr reports swelling in LLE compared to RLE (reports chronic issue)   POSTURE: rounded shoulders, forward head, and increased thoracic kyphosis    LOWER EXTREMITY MMT:    Grossly 4+/5 BLE exception hip flexors 4-/5 BLE   TRANSFERS: Assistive device utilized: Single point cane  Sit to stand: Modified independence Stand to sit: Modified independence Chair to chair: Modified independence   GAIT: Gait pattern: step to pattern, decreased arm swing- Left, decreased stride length, and narrow BOS, dec B hip ext throughout gait cycle Distance walked: 10 m Assistive device utilized: Single point cane Level of assistance:  Close CGA,  pt reports fear of falls/unsteadiness observed   FUNCTIONAL TESTs:  5 times sit to stand: 28 seconds  Timed up and go (TUG): 32 sec with SPC 10 meter walk test: 0.45 m/s with SPC close CGA due to unsteadiness  Berg Balance Scale: deferred   PATIENT SURVEYS:  FOTO 38 (goal score 47)  TODAY'S TREATMENT:  BP: 126/54 mmHg HR: 60   SPO2: 97%   Gait belt donned and CGA provided unless specified otherwise   There.ex:    Seated hamstring curls with RTB resistance, 2x10 each LE  LAQ 2 x 10 reps 5#AW   Standing march 2 x 10 on ea LE   STS x10 hands-free, continues to require initial cuing for technique   Neuro:  Bouts of walking with SPC  20 feet, 60 feet, 80 feet with rest break  seated between bouts  -increased shuffling with fatigue correlated with anterior lean.      PATIENT EDUCATION: Education details: Pt educated throughout session about proper posture and technique with exercises. Improved exercise technique, movement at target joints, use of target muscles after min to mod verbal, visual, tactile cues.  Person educated: Patient and Child(ren) (adult daughter) Education method: Explanation, Demonstration, Tactile cues, and Verbal cues Education comprehension: verbalized understanding, returned demonstration, verbal cues required, tactile cues required, and needs further education   HOME EXERCISE PROGRAM: No updates today, pt to continue plan as previously indicated Access Code: [email or web address] URL: [email or web address] Date: 04/03/2022 Prepared by: Ricard Dillon  Exercises - Seated March  - 1 x daily - 5 x weekly - 3 sets - 20 reps - alternating leg hold    GOALS: Goals reviewed with patient? Yes  SHORT TERM GOALS: Target date: 08/02/2022  Patient will be independent in home exercise program to improve strength/mobility for better functional independence with ADLs. Baseline: initiated  Goal status: INITIAL    LONG TERM GOALS: Target date: 09/13/2022  Patient will increase FOTO score to equal to or greater than  47   to demonstrate statistically significant improvement in mobility and quality of life.  Baseline: 38 05/25/22: 42 Goal status: IN PROGRESS  2.  Patient (> 32 years old) will complete five times sit to stand test in < 15 seconds indicating an increased LE strength and improved balance. Baseline: 28 sec use of UE 05/25/22: 19.57 sec use of UE's on knees Goal status: IN PROGRESS  3. Patient will increase 10 meter walk test to >1.65ms as to improve gait speed for better community ambulation and to reduce fall risk.  Baseline: 0.45 m/s w SPC, close CGA 05/25/22: 0.56 m/s w SPC, close CGA Goal status: IN PROGRESS  4.    Patient will reduce timed up and go to <15 seconds to reduce fall risk and demonstrate improved transfer/gait ability. Baseline: 32 sec with SPC 05/25/22: 29.19 sec with SPC Goal status: IN PROGRESS  5.   Patient will increase Berg Balance score by > 6 points to demonstrate decreased fall risk during functional activities. Baseline: to be completed next 1-2 sessions; 10/18: 32/56 05/25/22:  37/56 Goal status: IN PROGRESS   ASSESSMENT:  CLINICAL IMPRESSION:  Pt presents to therapy session post recovering from COVID-19. Pt feeling very weak and experiencing increased fear of falling compared to previous session prior to Covid. Pt has decreased efficacy with gait activities and shows anterior lean when fatiguing with gait. Pt seems to have regressed slightly during COVID illness and recovery but she was encouraged her previous level of function will be beneficial  in recovering from her recent illness. Pt FOF has increased since COVID recovery and has been using UE for support a lot more often. Pt will continue to benefit from skilled physical therapy intervention to address impairments, improve QOL, and attain therapy goals.      OBJECTIVE IMPAIRMENTS Abnormal gait, decreased activity tolerance, decreased balance, decreased cognition, decreased endurance, decreased knowledge of use of DME, decreased mobility, difficulty walking, decreased strength, impaired vision/preception, improper body mechanics, postural dysfunction, and pain.   ACTIVITY LIMITATIONS squatting, stairs, transfers, and locomotion level  PARTICIPATION LIMITATIONS: meal prep, cleaning, laundry, shopping, community activity, and yard work  PERSONAL FACTORS Age, Fitness, Past/current experiences, Sex, and 3+ comorbidities: Per chart PMH significant for hypertension, hyperlipidemia, chronic lymphocytic leukemia, chronic hyponatremia and SIADH, generalized weakness, hypothyroidism, carotid atherosclerosis, lymphocytosis, neoplasm  of uncertain behavior of skin of ear and nose, B12 deficiency, bladder prolapse, glaucoma, Chronic diastolic congestive heart failure, paroxysmal atria fibrillation, lung nodules, mild cognitive impairment, osteoporosis, first degree AV block, normocytic anemia, falls,   are also affecting patient's functional outcome.   REHAB POTENTIAL: Good  CLINICAL DECISION MAKING: Evolving/moderate complexity  EVALUATION COMPLEXITY: Moderate  PLAN: PT FREQUENCY: 2x/week  PT DURATION: 12 weeks  PLANNED INTERVENTIONS: Therapeutic exercises, Therapeutic activity, Neuromuscular re-education, Balance training, Gait training, Patient/Family education, Self Care, Joint mobilization, Stair training, Vestibular training, Canalith repositioning, Orthotic/Fit training, DME instructions, Wheelchair mobility training, Spinal mobilization, Cryotherapy, Moist heat, scar mobilization, Splintting, Taping, Ultrasound, Parrafin, Biofeedback, Manual therapy, and Re-evaluation  PLAN FOR NEXT SESSION:   Strength, balance, SPC gait training, continue plan   Particia Lather PT  06/21/22, 3:56 PM

## 2022-06-20 NOTE — Progress Notes (Signed)
Cardiology Clinic Note   Patient Name: Sydney Mcdaniel Date of Encounter: 06/23/2022  Primary Care Provider:  Teodora Medici, DO Primary Cardiologist:  Nelva Bush, MD  Patient Profile    Sydney Mcdaniel is a 88 y.o. female with a past medical history of HFrEF, PAF, syncope, carotid atherosclerosis, SSS s/p pacemaker implantation March 2023, hypertension, hyperlipidemia who presents to the clinic today for 1 month follow-up.   Past Medical History    Past Medical History:  Diagnosis Date   Allergy    Anemia    Cardiomyopathy ([place])    a. 02/2019 Echo: EF 60-65%, no rwma, doppler parameters consistent w/ pseudonormalization. Nl RV size/fxn; b. 06/2021 Echo: EF 40-45% w/ sev basal-mid antsept HK. GrII DD. Nl RV size/fxn. Mild MR/AI.   Carotid arterial disease ([place])    a. 04/2020 Carotid U/S: 1-39% bilat ICA stenosis, <50% bilat ECA stenosis; b. 06/2021 Carotid U/S: RICA 2-02%, LICA 54-27%.   Cataract    CLL (chronic lymphocytic leukemia) (HCC)    GERD (gastroesophageal reflux disease)    Glaucoma    H/O: hysterectomy    Total   History of stress test    a. 02/2019 MV: EF 60%, no ischemia/infarct.   Hyperlipidemia    Hypertension    Hypothyroidism    Impaired fasting glucose    Lichen sclerosus    Osteoporosis    Hips   PAF (paroxysmal atrial fibrillation) ([place])    a. 06/15/2016 Event monitor: 4% afib burden; b. CHA2DS2VASc - 4-->Xarelto.   Sinus bradycardia    a. avoid AVN blocking agents.   Syncope    a. 03/2016 Echo: EF 55-60%, no rwma, mild AI/MR, nl PASP; b. 03/2016 48h Holter: no significant arrhythmias/pauses; c. 03/2016 MV: mild apical defect, likely breast attenuation, nl EF, low risk; d. 05/2016 Event monitor: No significant arrhythmia; e. 05/2016 Event monitor: PAF (4%).   Past Surgical History:  Procedure Laterality Date   APPENDECTOMY     EYE SURGERY     Glaucoma   MULTIPLE TOOTH EXTRACTIONS N/A    2 teeth   PACEMAKER IMPLANT N/A 09/22/2021    Procedure: PACEMAKER IMPLANT;  Surgeon: Deboraha Sprang, MD;  Location: [facility];  Service: Cardiovascular;  Laterality: N/A;   TOTAL ABDOMINAL HYSTERECTOMY      Allergies  Allergies  Allergen Reactions   Alphagan [Brimonidine] Itching and Rash   Pravachol [Pravastatin Sodium] Itching and Rash    History of Present Illness    Sydney Mcdaniel has a past medical history of: Syncope/vasovagal. Stress test 04/10/2016: Normal, low risk study.  Echo 04/17/2016: EF 55-60%. Mild AR. Mild MR.  48-hour Holter monitor 04/18/2016: First degree AV block, PACs and PVCs.  PAF.  30 day event monitor 07/20/2016: Evidence of afib 4% total duration. HR 59-118 bpm. Overall rhythm sinus with first degree AV block and BBB.  HFrEF.  Stress test 02/26/2019: Normal, low risk study.  Echo 02/26/2019: EF 60-65%. Mild MR. Mild TR.  SSS.  14 day Zio 09/02/2021: Min HR of 37 bpm, max HR of 132 bpm, and avg HR of 57 bpm. Predominant underlying rhythm was Sinus Rhythm. First Degree AV Block was present. 6 Supraventricular Tachycardia runs occurred, the run with the fastest interval lasting 5 beats with a max rate of 132 bpm, the longest lasting 6 beats with an avg rate of 101 bpm. MD notification criteria for Symptomatic Bradycardia met. Recommended pacemaker implantation  09/22/2021: Biotronik pacemaker placed.  Remote device check 03/24/2022: Normal  device function, PAF burden 6%, controlled rates. Battery status good. Lead measurements unchanged.  Carotid artery disease.  Carotid US 06/29/2010: >50% left proximal ICA.  Carotid US 07/01/2015: Right carotid <50%. Left common carotid and carotid bifurcation/ICA 50-69%.  Carotid US 04/26/2016: Bilateral ICA 40-59%.  Carotid US 04/30/2017: Bilateral proximal ICA 40-59%, left ECA >50%.  Carotid US 05/02/2018: Right ICA 1-39%. Left ICA 40-59%. Carotid US 07/13/2021: Right ICA 1-39%, ECA <50%. Left ICA 40-59%, ECA >50%. Essentially unchanged from 2021.  Hypertension.   Hyperlipidemia.  Lipid panel 11/17/2020: LDL 90, HDL 55, TG 147, total 171.  Ms. Konkel was first evaluated by Dr. Yvone Neu on 03/29/2016 for syncope and bradycardia. Syncope thought to be related to hypotension. Carotid US 40-59% bilateral ICA. Stress test was normal, low risk study. Echo showed normal LV/RV function, mild MR, mild AR. Event monitor showed first degree AV block, PACs and PVCs. She has had several hospitalizations for syncope and orthostasis thereafter with many syncopal episodes related to defecation or micturition. She was evaluated in the office on 08/10/2021 by Ignacia Bayley, NP and it was reported her heart rate was dropping into the 40s and soft BP at home. She was asymptomatic in the office with a heart rate of 43 with an increase to mid 50s while walking in the hall. 14 day Zio was placed which showed a min HR 37 bpm, max HR 132, and avg 57 bpm. Criteria for symptomatic bradycardia was met. Patient underwent Biotronik pacemaker insertion on 09/22/2021. Patient was last seen in the office by Dr. Saunders Revel on 05/04/2022. At that time she continued to have generalized weakness and gait instability with two recent falls secondary to imbalance. Midodrine was held secondary to hypertensive in the office at ~190/90 as well SBP readings at home in the 140-180s. She was again encouraged to wear an abdominal binder.   Today, patient is accompanied by her daughter. She has been doing well up until this morning when she had a weak spell while she was leaving the house. Daughter reports she "perked up" on the ride over. BP log was left at home but daughter reports yesterday BP 117/52 and day before 137/70. BP today 112/50. Prior to episode today, patient had one other episode of feeling unsteady in the setting of Covid on 06/04/2022. Patient denies shortness of breath. She does report feeling slightly winded if she moves around the house a lot causing her to take a brief moment to catch her breath but this is  not new for her No chest pain, pressure, or tightness. Denies lower extremity edema, orthopnea, or PND. No palpitations. Patient has had a 10 lb unintentional weight loss since 05/09/2022. Daughter states between patient's decreased appetite, Covid, and having several teeth pulled she is not eating much.    Home Medications    Current Meds  Medication Sig   acetaminophen (TYLENOL) 500 MG tablet Take 500 mg by mouth every 8 (eight) hours as needed for moderate pain.   alendronate (FOSAMAX) 70 MG tablet TAKE 1 TABLET BY MOUTH EVERY FRIDAY ON AN EMPTY STOMACH WITH A FULL GLASS OF WATER   atorvastatin (LIPITOR) 20 MG tablet Take 1 tablet (20 mg total) by mouth every evening.   Cholecalciferol 25 MCG (1000 UT) tablet Take by mouth.   ferrous sulfate 325 (65 FE) MG tablet Take 2 tablets (650 mg total) by mouth daily with breakfast.   levothyroxine (EUTHYROX) 75 MCG tablet TAKE ONE TABLET BY MOUTH ON MONDAYS, WEDNESDAYS, FRIDAYS, AND SUNDAYS  levothyroxine (SYNTHROID) 88 MCG tablet TAKE 1 TABLET BY MOUTH ON TUESDAYS, THURSDAYS AND SATURDAYS   lisinopril (ZESTRIL) 40 MG tablet Take 1 tablet by mouth once daily   Loteprednol Etabonate (LOTEMAX OP) Apply 1 drop to eye at bedtime. To right eye   Netarsudil Dimesylate (RHOPRESSA) 0.02 % SOLN Place 1 drop into the right eye at bedtime.   ondansetron (ZOFRAN) 8 MG tablet Take 8 mg by mouth as needed.   polyethylene glycol (MIRALAX / GLYCOLAX) 17 g packet Take 17 g by mouth daily.   Rivaroxaban (XARELTO) 15 MG TABS tablet TAKE 1 TABLET EVERY DAY WITH SUPPER   sodium chloride 1 g tablet Take 1 g by mouth. Takes 1/2 tablet with breakfast and lunch and 1 whole tablet with supper   spironolactone (ALDACTONE) 25 MG tablet Take 1 tablet by mouth once daily   vitamin B-12 (CYANOCOBALAMIN) 100 MCG tablet Take 100 mcg by mouth daily.   [DISCONTINUED] amLODipine (NORVASC) 5 MG tablet Take 0.5 tablets (2.5 mg total) by mouth daily.    Family History    Family  History  Problem Relation Age of Onset   Heart attack Mother    Glaucoma Mother    Heart attack Father    Parkinson's disease Brother    Heart attack Brother    She indicated that her mother is deceased. She indicated that her father is deceased. She indicated that her sister is deceased. She indicated that all of her three brothers are deceased. She indicated that her maternal grandmother is deceased. She indicated that her maternal grandfather is deceased. She indicated that her paternal grandmother is deceased. She indicated that her paternal grandfather is deceased. She indicated that her daughter is alive. She indicated that her son is alive.   Social History    Social History   Socioeconomic History   Marital status: Widowed    Spouse name: Not on file   Number of children: 2   Years of education: Not on file   Highest education level: High school graduate  Occupational History   Occupation: retired  Tobacco Use   Smoking status: Never   Smokeless tobacco: Never  Vaping Use   Vaping Use: Never used  Substance and Sexual Activity   Alcohol use: No   Drug use: No   Sexual activity: Not Currently  Other Topics Concern   Not on file  Social History Narrative   Pt lives alone. Daughter [name] close by   Social Determinants of Health   Financial Resource Strain: Low Risk  (03/22/2022)   Overall Financial Resource Strain (CARDIA)    Difficulty of Paying Living Expenses: Not hard at all  Food Insecurity: No Food Insecurity (03/22/2022)   Hunger Vital Sign    Worried About Running Out of Food in the Last Year: Never true    Ran Out of Food in the Last Year: Never true  Transportation Needs: No Transportation Needs (03/22/2022)   PRAPARE - Hydrologist (Medical): No    Lack of Transportation (Non-Medical): No  Physical Activity: Inactive (07/21/2021)   Exercise Vital Sign    Days of Exercise per Week: 0 days    Minutes of Exercise per Session: 0  min  Stress: No Stress Concern Present (03/22/2022)   [place]    Feeling of Stress : Not at all  Social Connections: Moderately Isolated (03/22/2022)   Social Connection and Isolation Panel [NHANES]  Frequency of Communication with Friends and Family: More than three times a week    Frequency of Social Gatherings with Friends and Family: More than three times a week    Attends Religious Services: More than 4 times per year    Active Member of [employer] or Organizations: No    Attends Archivist Meetings: Never    Marital Status: Widowed  Intimate Partner Violence: Not At Risk (03/22/2022)   Humiliation, Afraid, Rape, and Kick questionnaire    Fear of Current or Ex-Partner: No    Emotionally Abused: No    Physically Abused: No    Sexually Abused: No     Review of Systems    General:  No chills, fever, night sweats. Positive for weight loss. Positive for weakness.  Cardiovascular:  No chest pain, dyspnea on exertion, edema, orthopnea, palpitations, paroxysmal nocturnal dyspnea. Dermatological: No rash, lesions/masses Respiratory: No cough, dyspnea Urologic: No hematuria, dysuria Abdominal:   No nausea, vomiting, diarrhea, bright red blood per rectum, melena, or hematemesis Neurologic:  No visual changes, weakness, changes in mental status. All other systems reviewed and are otherwise negative except as noted above.  Physical Exam    VS:  BP (!) 112/50 (BP Location: Right Arm, Patient Position: Sitting, Cuff Size: Normal)   Pulse 80   Ht _0  (1.575 m)   Wt 125 lb (56.7 kg)   LMP  (LMP Unknown)   SpO2 96%   BMI 22.86 kg/m  , BMI Body mass index is 22.86 kg/m. GEN:  Well nourished, well developed, in no acute distress. HEENT: Normal. Neck: Supple, no JVD, carotid bruits, or masses. Cardiac: RRR, no murmurs, rubs, or gallops. No clubbing, cyanosis, edema.  Radials/DP/PT 2+ and equal bilaterally.   Respiratory:  Respirations regular and unlabored, clear to auscultation bilaterally. GI: Soft, nontender, nondistended. MS: No deformity or atrophy. Skin: Warm and dry, no rash. Neuro: Strength and sensation are intact. Psych: Normal affect.  Accessory Clinical Findings    Recent Labs: 01/31/2022: ALT 12; Hemoglobin 11.1; Platelets 188 02/02/2022: BUN 16; Creat 0.99; Potassium 5.0; Sodium 131   Recent Lipid Panel    Component Value Date/Time   CHOL 171 11/17/2020 0855   TRIG 147 11/17/2020 0855   HDL 55 11/17/2020 0855   CHOLHDL 3.1 11/17/2020 0855   CHOLHDL 3.1 07/30/2019 0925   VLDL 22 07/30/2019 0925   LDLCALC 90 11/17/2020 0855   LDLCALC 90 07/02/2018 1040         ECG personally reviewed by me today: Afib with nonspecific intraventricular block, HR 80.  No significant changes from 05/04/2022   CHA2DS2-VASc Score = 5   This indicates a 7.2% annual risk of stroke. The patient's score is based upon: CHF History: 1 HTN History: 1 Diabetes History: 0 Stroke History: 0 Vascular Disease History: 0 Age Score: 2 Gender Score: 1      Assessment & Plan   PAF/SSS s/p pacemaker placement/syncope. Pacemaker placed 09/22/2021. Remote device check September 2023 was normal. Midodrine was stopped at office visit in November for hypertension and patient was started on low dose amlodipine. Reports two episodes of weakness one 06/04/2022 in the setting of Covid and one occurring today as she was leaving the house. She did not pass out but felt weak and presyncopal. BP 112/50 on intake and 100/50 at recheck. BP logs left at home but BP yesterday was 117/52 and day before 137/70 after medication. Daughter reports these have been typical readings for the last month. Patient reports  still feeling a little weak currently but no dizziness. BP will be checked prior to taking morning medications and amlodipine will be held if SBP <140. Daughter will spot check BP through the weekend in the  afternoon. Instructed patient to not get up on her own if she continues to feel weak and unsteady. She agrees. Continue Xarelto.  Unintentional weight loss. Patient down 10 lb from 05/09/2022. Daughter reports decreased appetite secondary to recent Covid and difficulty eating secondary to teeth extractions. Patient endorses decreased appetite. Will try Boost or Ensure on days she does not feel like eating.  HFrEF. Echo September 2020 EF 60-65%, mild MR, mild TR. Patient denies lower extremity edema, abdominal bloating/fullness, shortness of breath or DOE. Continue lisinopril, spironolactone.  Carotid artery disease. Carotid US January 2023 showed Right ICA 1-39%, RCA <50%. Left ICA 40-59%, ECA >50%. Stable from 2021. Annual surveillance recommended. Hypertension. BP today 112/50. Patient's daughter reports BP at home yesterday 117/52 and 137/70 the day before. She had an episode of weakness and presyncope this morning for the first time in a month. She will hold dose of amlodipine if SBP <140. Continue lisinopril and spironolactone.  Hyperlipidemia. LDL May 2022 90. Secondary to patient feeling unwell today will not get lipid panel. Consider at next visit.       Disposition: Hold amlodipine for SBP <140. Return in 1 month or sooner as needed.    Justice [place]. Navneet Schmuck, DNP, NP-C     06/23/2022, 10:13 AM [place] [address] Office [phone] Fax [phone]   I spent 13 minutes examining this patient, reviewing medications, and using patient centered shared decision making involving her cardiac care.  Prior to her visit I spent greater than 20 minutes reviewing her past medical history,  medications, and prior cardiac tests.

## 2022-06-21 ENCOUNTER — Encounter: Payer: Self-pay | Admitting: Physical Therapy

## 2022-06-21 ENCOUNTER — Ambulatory Visit: Payer: Medicare Other | Admitting: Physical Therapy

## 2022-06-21 DIAGNOSIS — R1312 Dysphagia, oropharyngeal phase: Secondary | ICD-10-CM | POA: Diagnosis not present

## 2022-06-21 DIAGNOSIS — M6281 Muscle weakness (generalized): Secondary | ICD-10-CM | POA: Diagnosis not present

## 2022-06-21 DIAGNOSIS — R2681 Unsteadiness on feet: Secondary | ICD-10-CM | POA: Diagnosis not present

## 2022-06-21 DIAGNOSIS — R262 Difficulty in walking, not elsewhere classified: Secondary | ICD-10-CM | POA: Diagnosis not present

## 2022-06-23 ENCOUNTER — Other Ambulatory Visit: Payer: Self-pay | Admitting: Internal Medicine

## 2022-06-23 ENCOUNTER — Encounter: Payer: Self-pay | Admitting: Cardiology

## 2022-06-23 ENCOUNTER — Ambulatory Visit: Payer: Medicare Other | Attending: Nurse Practitioner | Admitting: Student

## 2022-06-23 VITALS — BP 112/50 | HR 80 | Ht 62.0 in | Wt 125.0 lb

## 2022-06-23 DIAGNOSIS — I480 Paroxysmal atrial fibrillation: Secondary | ICD-10-CM

## 2022-06-23 DIAGNOSIS — E785 Hyperlipidemia, unspecified: Secondary | ICD-10-CM

## 2022-06-23 DIAGNOSIS — I495 Sick sinus syndrome: Secondary | ICD-10-CM

## 2022-06-23 DIAGNOSIS — E039 Hypothyroidism, unspecified: Secondary | ICD-10-CM

## 2022-06-23 DIAGNOSIS — R55 Syncope and collapse: Secondary | ICD-10-CM

## 2022-06-23 DIAGNOSIS — I5022 Chronic systolic (congestive) heart failure: Secondary | ICD-10-CM | POA: Diagnosis not present

## 2022-06-23 DIAGNOSIS — I6523 Occlusion and stenosis of bilateral carotid arteries: Secondary | ICD-10-CM

## 2022-06-23 DIAGNOSIS — Z950 Presence of cardiac pacemaker: Secondary | ICD-10-CM | POA: Diagnosis not present

## 2022-06-23 DIAGNOSIS — R634 Abnormal weight loss: Secondary | ICD-10-CM

## 2022-06-23 LAB — CUP PACEART REMOTE DEVICE CHECK
Battery Voltage: 95
Date Time Interrogation Session: 20231224081240
Implantable Lead Connection Status: 753985
Implantable Lead Connection Status: 753985
Implantable Lead Implant Date: 20230330
Implantable Lead Implant Date: 20230330
Implantable Lead Location: 753859
Implantable Lead Location: 753860
Implantable Lead Model: 377
Implantable Lead Model: 399
Implantable Lead Serial Number: 8000191707
Implantable Lead Serial Number: 8000584009
Implantable Pulse Generator Implant Date: 20230330
Pulse Gen Model: 407145
Pulse Gen Serial Number: 70372674

## 2022-06-23 MED ORDER — AMLODIPINE BESYLATE 5 MG PO TABS: 2.5000 mg | ORAL_TABLET | Freq: Every day | ORAL | 3 refills | Status: DC

## 2022-06-23 NOTE — Telephone Encounter (Signed)
Requested Prescriptions  Pending Prescriptions Disp Refills   levothyroxine (SYNTHROID) 88 MCG tablet [Pharmacy Med Name: Levothyroxine Sodium 88 MCG Oral Tablet] 39 tablet 0    Sig: TAKE 1 TABLET BY MOUTH ON TUESDAYS, [place]     Endocrinology:  Hypothyroid Agents Failed - 06/23/2022 11:10 AM      Failed - TSH in normal range and within 360 days    TSH  Date Value Ref Range Status  03/03/2021 2.05 0.40 - 4.50 mIU/L Final         Passed - Valid encounter within last 12 months    Recent Outpatient Visits           1 month ago Hypothyroidism, unspecified type   [place], DO   4 months ago Hyponatremia   [facility] Teodora Medici, DO   7 months ago Ambulatory dysfunction   [place], DO   11 months ago Aspiration pneumonia of right lower lobe, unspecified aspiration pneumonia type [facility])   [facility] Kathrine Haddock, NP   1 year ago Hyponatremia   [place], NP       Future Appointments             In 1 month  [facility], [place]   In 1 month End, Harrell Gave, MD [place]. [place]   In 4 months Teodora Medici, [facility], [facility]

## 2022-06-23 NOTE — Patient Instructions (Signed)
Medication Instructions:  AMLODIPINE: Hold for a systolic (top number) less than 140  *If you need a refill on your cardiac medications before your next appointment, please call your pharmacy*   Lab Work: None ordered If you have labs (blood work) drawn today and your tests are completely normal, you will receive your results only by: [place] (if you have MyChart) OR A paper copy in the mail If you have any lab test that is abnormal or we need to change your treatment, we will call you to review the results.   Testing/Procedures: None ordered   Follow-Up: At [facility], you and your health needs are our priority.  As part of our continuing mission to provide you with exceptional heart care, we have created designated Provider Care Teams.  These Care Teams include your primary Cardiologist (physician) and Advanced Practice Providers (APPs -  Physician Assistants and Nurse Practitioners) who all work together to provide you with the care you need, when you need it.  We recommend signing up for the patient portal called "MyChart".  Sign up information is provided on this After Visit Summary.  MyChart is used to connect with patients for Virtual Visits (Telemedicine).  Patients are able to view lab/test results, encounter notes, upcoming appointments, etc.  Non-urgent messages can be sent to your provider as well.   To learn more about what you can do with MyChart, go to [email or web address].    Your next appointment:   1 month(s)  The format for your next appointment:   In Person  Provider:   You may see Nelva Bush, MD or one of the following Advanced Practice Providers on your designated Care Team:   [name], NP Christell Faith, PA-C Cadence Kathlen Mody, PA-C Gerrie Nordmann, NP

## 2022-06-27 ENCOUNTER — Ambulatory Visit: Payer: Medicare Other | Attending: Internal Medicine

## 2022-06-27 DIAGNOSIS — H401132 Primary open-angle glaucoma, bilateral, moderate stage: Secondary | ICD-10-CM | POA: Diagnosis not present

## 2022-06-27 DIAGNOSIS — R262 Difficulty in walking, not elsewhere classified: Secondary | ICD-10-CM | POA: Diagnosis not present

## 2022-06-27 DIAGNOSIS — H04129 Dry eye syndrome of unspecified lacrimal gland: Secondary | ICD-10-CM | POA: Diagnosis not present

## 2022-06-27 DIAGNOSIS — H01004 Unspecified blepharitis left upper eyelid: Secondary | ICD-10-CM | POA: Diagnosis not present

## 2022-06-27 DIAGNOSIS — H01009 Unspecified blepharitis unspecified eye, unspecified eyelid: Secondary | ICD-10-CM | POA: Diagnosis not present

## 2022-06-27 DIAGNOSIS — H1820 Unspecified corneal edema: Secondary | ICD-10-CM | POA: Diagnosis not present

## 2022-06-27 DIAGNOSIS — M6281 Muscle weakness (generalized): Secondary | ICD-10-CM | POA: Diagnosis not present

## 2022-06-27 DIAGNOSIS — R2681 Unsteadiness on feet: Secondary | ICD-10-CM | POA: Diagnosis not present

## 2022-06-27 NOTE — Therapy (Signed)
OUTPATIENT PHYSICAL THERAPY NEURO TREATMENT NOTE/RECERT  Patient Name: Sydney Mcdaniel MRN: [id number] DOB:Jan 01, 1925, 89 y.o., female Today's Date: 06/27/2022  PCP: Teodora Medici, DO REFERRING PROVIDER: Teodora Medici, DO    PT End of Session - 06/27/22 1035     Visit Number 13    Number of Visits 25    Date for PT Re-Evaluation 09/19/22    Authorization Time Period 04/03/22-06/26/2022    Progress Note Due on Visit 20    PT Start Time 0851    PT Stop Time 0930    PT Time Calculation (min) 39 min    Equipment Utilized During Treatment Gait belt    Activity Tolerance Patient tolerated treatment well;No increased pain    Behavior During Therapy WFL for tasks assessed/performed               Past Medical History:  Diagnosis Date   Allergy    Anemia    Cardiomyopathy ([place])    a. 02/2019 Echo: EF 60-65%, no rwma, doppler parameters consistent w/ pseudonormalization. Nl RV size/fxn; b. 06/2021 Echo: EF 40-45% w/ sev basal-mid antsept HK. GrII DD. Nl RV size/fxn. Mild MR/AI.   Carotid arterial disease ([place])    a. 04/2020 Carotid U/S: 1-39% bilat ICA stenosis, <50% bilat ECA stenosis; b. 06/2021 Carotid U/S: RICA 9-52%, LICA 84-13%.   Cataract    CLL (chronic lymphocytic leukemia) (HCC)    GERD (gastroesophageal reflux disease)    Glaucoma    H/O: hysterectomy    Total   History of stress test    a. 02/2019 MV: EF 60%, no ischemia/infarct.   Hyperlipidemia    Hypertension    Hypothyroidism    Impaired fasting glucose    Lichen sclerosus    Osteoporosis    Hips   PAF (paroxysmal atrial fibrillation) ([place])    a. 06/15/2016 Event monitor: 4% afib burden; b. CHA2DS2VASc - 4-->Xarelto.   Sinus bradycardia    a. avoid AVN blocking agents.   Syncope    a. 03/2016 Echo: EF 55-60%, no rwma, mild AI/MR, nl PASP; b. 03/2016 48h Holter: no significant arrhythmias/pauses; c. 03/2016 MV: mild apical defect, likely breast attenuation, nl EF, low risk; d. 05/2016 Event monitor:  No significant arrhythmia; e. 05/2016 Event monitor: PAF (4%).   Past Surgical History:  Procedure Laterality Date   APPENDECTOMY     EYE SURGERY     Glaucoma   MULTIPLE TOOTH EXTRACTIONS N/A    2 teeth   PACEMAKER IMPLANT N/A 09/22/2021   Procedure: PACEMAKER IMPLANT;  Surgeon: Deboraha Sprang, MD;  Location: [facility];  Service: Cardiovascular;  Laterality: N/A;   TOTAL ABDOMINAL HYSTERECTOMY     Patient Active Problem List   Diagnosis Date Noted   HFrEF (heart failure with reduced ejection fraction) ([place]) 05/06/2022   Sick sinus syndrome ([place]) 05/06/2022   General weakness 02/01/2022   Arm pain, anterior, right 01/30/2022   SIADH (syndrome of inappropriate ADH production) ([place]) 07/05/2021   Aspiration pneumonia ([place]) 05/21/2021   Elevated troponin 05/20/2021   Fall 02/11/2021   Normocytic anemia 05/31/2020   Goals of care, counseling/discussion 03/08/2020   Positive direct antiglobulin test (DAT) 12/04/2019   Sinus bradycardia 04/09/2019   First degree AV block 04/09/2019   Osteoporosis 08/21/2018   Bilateral carotid artery stenosis 04/19/2018   History of syncope 10/17/2017   MCI (mild cognitive impairment) 08/12/2017   Lung nodules 07/09/2017   Orthostatic lightheadedness 04/18/2017   Paroxysmal atrial fibrillation ([place]) 04/18/2017  Hyponatremia 10/20/2016   Chronic diastolic congestive heart failure ([place]) 06/28/2016   Syncope, near 05/24/2016   Primary open-angle glaucoma, bilateral, mild stage 05/08/2016   Bladder prolapse, female, acquired 02/25/2016   Asymptomatic bacteriuria 02/25/2016   B12 deficiency 01/11/2016   Anemia 01/10/2016   CLL (chronic lymphocytic leukemia) ([place]) 01/10/2016   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 12/21/2015   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 12/21/2015   Bradycardia 12/21/2015   Lymphocytosis 06/25/2015   Impaired fasting glucose 06/22/2015   Carotid atherosclerosis 06/22/2015   Essential  hypertension 12/22/2014   Hyperlipidemia 12/22/2014   Hypothyroidism 12/22/2014    ONSET DATE: 01/30/2022  REFERRING DIAG: R26.9 (ICD-10-CM) - Abnormality of gait and mobility  THERAPY DIAG:  Muscle weakness (generalized) - Plan: PT plan of care cert/re-cert  Unsteadiness on feet - Plan: PT plan of care cert/re-cert  Difficulty in walking, not elsewhere classified - Plan: PT plan of care cert/re-cert  Rationale for Evaluation and Treatment Rehabilitation  SUBJECTIVE:   SUBJECTIVE STATEMENT:  Pt's BP has been low and high per daughter. Pt DTR reports pt has been feeling weak over the weekend, but now recovering well from [place].  This am pt BP was 151/88 mmHg per DTR. Pt reports no falls/stumbles. Pt reports no pain.  [name]: family member [name]  PERTINENT HISTORY: [name] back in 2022 for weakness, imbalance. She returns for generalized weakness, balance issues and falls following hospitalization 01/30/22-01/31/22 for acute on chronic hyponatremia, near syncope, RUE pain and generalized weakness. Pt biggest concern is her balance. 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   FALLS: Has patient fallen in last 6 months? Yes. Number of falls 1  LIVING ENVIRONMENT: Lives with: lives with their family and lives alone, but daughter lives next door Lives in: House/apartment Stairs:  pt has a ramp, but one step from back porch into the  house (pt grabs onto shelves on R hand side) Has following equipment at home: Single point cane, Walker - 4 wheeled, Wheelchair (manual), shower chair, bed side commode, Grab bars, and Ramped entry  PLOF: Independent  PATIENT GOALS: "I want more balance"    OBJECTIVE:   DIAGNOSTIC FINDINGS:   CT head WO contrast (5MM) 8/7/20203 per chart "IMPRESSION: No evidence of acute intracranial abnormality."  COGNITION: Overall cognitive status: History of cognitive impairments - at baseline per pt's daughter, mild cog impairment per chart   SENSATION: Pt daughter reports usually LLE impaired vs. RLE.  Pt able to feel light touch to BLE on exam  COORDINATION: WNL BUE and BLE   EDEMA:  Dtr reports swelling in LLE compared to RLE (reports chronic issue)   POSTURE: rounded shoulders, forward head, and increased thoracic kyphosis    LOWER EXTREMITY MMT:    Grossly 4+/5 BLE exception hip flexors 4-/5 BLE   TRANSFERS: Assistive device utilized: Single point cane  Sit to stand: Modified independence Stand to sit: Modified independence Chair to chair: Modified independence   GAIT: Gait pattern: step to pattern, decreased arm swing- Left, decreased stride length, and narrow BOS,  dec B hip ext throughout gait cycle Distance walked: 10 m Assistive device utilized: Single point cane Level of assistance:  Close CGA, pt reports fear of falls/unsteadiness observed   FUNCTIONAL TESTs:  5 times sit to stand: 28 seconds  Timed up and go (TUG): 32 sec with SPC 10 meter walk test: 0.45 m/s with SPC close CGA due to unsteadiness  Berg Balance Scale: deferred   PATIENT SURVEYS:  FOTO 38 (goal score 47)  TODAY'S TREATMENT:   HR 74 bpm BP: 118/56 mmHg  Gait belt donned and CGA provided unless specified otherwise   There.ex:   LAQ 2 x 10 reps with 2# AW each LE. Rates medium  Seated March 2x10 each LE with 2# AW  Nustep - levels 1-2 x 6 minutes. Min assist for mount/dismount.  Cuing throughout for SPM 40s-50s. Pt monitored for response to intervention which was WNL.  STS 1x5, 1x8, 1x10 hands-free. Rates medium  Stand hip abd 5x, 10x each LE  Standing alt LE march onto 6" step 2x20 alt LE  Neuro:  Amb with SPC and CGA 1x60 ft    PATIENT EDUCATION: Education details: Pt educated throughout session about proper posture and technique with exercises. Improved exercise technique, movement at target joints, use of target muscles after min to mod verbal, visual, tactile cues.  Person educated: Patient and Child(ren) (adult daughter) Education method: Explanation, Demonstration, Tactile cues, and Verbal cues Education comprehension: verbalized understanding, returned demonstration, verbal cues required, tactile cues required, and needs further education   HOME EXERCISE PROGRAM: No updates today, pt to continue plan as previously indicated Access Code: [email or web address] URL: [email or web address] Date: 04/03/2022 Prepared by: Ricard Dillon  Exercises - Seated March  - 1 x daily - 5 x weekly - 3 sets - 20 reps - alternating leg hold    GOALS: Goals reviewed with patient? Yes  SHORT TERM GOALS: Target date: 08/08/2022  Patient will be independent in home exercise program to improve strength/mobility for better functional independence with ADLs. Baseline: initiated; 06/27/21: pt HEP to be advanced  Goal status: IN PROGRESS    LONG TERM GOALS: Target date: 09/19/2022  Patient will increase FOTO score to equal to or greater than  47   to demonstrate statistically significant improvement in mobility and quality of life.  Baseline: 38 05/25/22: 42; 1/2: deferred Goal status: IN PROGRESS  2.  Patient (> 69 years old) will complete five times sit to stand test in < 15 seconds indicating an increased LE strength and improved balance. Baseline: 28 sec use of UE 05/25/22: 19.57 sec use of UE's on knees; 1/2: deferred Goal status: IN PROGRESS  3. Patient  will increase 10 meter walk test to >1.21ms as to improve gait speed for better community ambulation and to reduce fall risk.  Baseline: 0.45 m/s w SPC, close CGA 05/25/22: 0.56 m/s w SPC, close CGA; 1/2: deferred Goal status: IN PROGRESS  4.   Patient will reduce timed up and go to <15 seconds to reduce fall risk and demonstrate improved transfer/gait ability. Baseline: 32 sec with SPC 05/25/22: 29.19 sec with SPC; 06/27/22: deferred Goal status: IN PROGRESS  5.   Patient will increase Berg Balance score by > 6 points to demonstrate decreased fall risk during functional activities. Baseline: to be completed next 1-2 sessions; 10/18: 32/56 05/25/22:  37/56; 06/27/22: deferred Goal status: IN PROGRESS   ASSESSMENT:  CLINICAL IMPRESSION: Pt continues to exhibit and report good recovery following COVID illness, however, still reports feeling somewhat  weak and monitoring blood pressures at home. BP WNL this a.m. Goal testing deferred to next visit as pt still feeling below her baseline prior to COVID illness. She was able to tolerate progressive therex today, but still using decrease weights. She tolerated this interventions well without pain and without significant fatigue. Generally, pt has been making steady progress with her LE strength since starting PT, despite recent set-back with illness. Patient's condition has the potential to improve in response to therapy. Maximum improvement is yet to be obtained. The anticipated improvement is attainable and reasonable in a generally predictable time. Will initiate goal reassessment next visit. Pt will continue to benefit from skilled physical therapy intervention to address impairments, improve QOL, and attain therapy goals.      OBJECTIVE IMPAIRMENTS Abnormal gait, decreased activity tolerance, decreased balance, decreased cognition, decreased endurance, decreased knowledge of use of DME, decreased mobility, difficulty walking, decreased strength,  impaired vision/preception, improper body mechanics, postural dysfunction, and pain.   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   REHAB POTENTIAL: Good  CLINICAL DECISION MAKING: Evolving/moderate complexity  EVALUATION COMPLEXITY: Moderate  PLAN: PT FREQUENCY: 2x/week  PT DURATION: 12 weeks  PLANNED INTERVENTIONS: Therapeutic exercises, Therapeutic activity, Neuromuscular re-education, Balance training, Gait training, Patient/Family education, Self Care, Joint mobilization, Stair training, Vestibular training, Canalith repositioning, Orthotic/Fit training, DME instructions, Wheelchair mobility training, Spinal mobilization, Cryotherapy, Moist heat, scar mobilization, Splintting, Taping, Ultrasound, Parrafin, Biofeedback, Manual therapy, and Re-evaluation  PLAN FOR NEXT SESSION:   Strength, balance, SPC gait training, continue plan   Zollie Pee PT  06/27/22, 10:45 AM

## 2022-06-28 ENCOUNTER — Ambulatory Visit: Payer: Self-pay | Admitting: *Deleted

## 2022-06-28 NOTE — Patient Instructions (Signed)
Visit Information  Thank you for taking time to visit with me today. Please don't hesitate to contact me if I can be of assistance to you before our next scheduled telephone appointment.  Following are the goals we discussed today:  Continue monitoring BP daily and follow parameters for medication management.   Our next appointment is by telephone on 2/16  Please call the care guide team at [phone] if you need to cancel or reschedule your appointment.   Please call the Suicide and Crisis Lifeline: 988 call the [place] National Suicide Prevention Lifeline: 715-384-6071 or TTY: 470-474-8652 TTY 251-053-8091) to talk to a trained counselor call 1-800-273-TALK (toll free, 24 hour hotline) call 911 if you are experiencing a Mental Health or [place] or need someone to talk to.  Patient verbalizes understanding of instructions and care plan provided today and agrees to view in [place]. Active MyChart status and patient understanding of how to access instructions and care plan via MyChart confirmed with patient.     The patient has been provided with contact information for the care management team and has been advised to call with any health related questions or concerns.   Valente David, RN, MSN, [facility] Care Management Coordinator [phone]

## 2022-06-28 NOTE — Patient Outreach (Signed)
  Care Coordination   Follow Up Visit Note   06/28/2022 Name: TAQWA DEEM MRN: [id number] DOB: 26-Feb-1925  CYTHINA MICKELSEN is a 89 y.o. year old female who sees Teodora Medici, DO for primary care. I spoke with [name], daughter of SHATEKA PETREA by phone today.  What matters to the patients health and wellness today?  Complete all visits missed while patient had Covid.  Has seen eye doctor and currently en route to dentis appointment.     Goals Addressed             This Visit's Progress    COMPLETED: Recover from Covid infection   On track    Care Coordination Interventions: Provided education to patient to enhance basic understanding of COVID-19 as a viral disease, measures to prevent exposure, signs and symptoms, recommended vaccine schedule, when to contact provider Advised patient to discuss other options for ongoing recovery if not improving with provider Has not been to PT sessions due to Covid, will restart tomorrow Completed course of antiviral on Friday.  Educated on when to seek emergency medical attention if not improving Continues with productive cough, taking Coricidin HBP for relief  1/3 - daughter report recovered from Covid, no longer an issue.  Restarted PT sessions      RNCM: Effective Management of health and well being   On track    Care Coordination Interventions: Evaluation of current treatment plan related to hyponatremia and hypertension and patient's adherence to plan as established by provider Advised patient to call the office for changes in conditions, new questions or concerns Provided education to patient re: safety precautions and falls prevention Reviewed medications with patient and discussed adherence and affordability.  Parameters for Amlodipine are 1/2 tablet if SBP greater then 140, does not take if less then 140 to prevent hypotension Provided patient with emailed educational materials related to hypertension and Covid Reviewed  scheduled/upcoming provider appointments including cardiology follow up on 2/8 and AWV on 1/30 Discussed plans with patient for ongoing care management follow up and provided patient with direct contact information for care management team Advised patient to discuss changes in conditions, questions, or concerns with provider Spoke to [name], the patients daughter, BP remains better, up and down at times           SDOH assessments and interventions completed:  No     Care Coordination Interventions:  Yes, provided   Follow up plan: Follow up call scheduled for 2/16    Encounter Outcome:  Pt. Visit Completed   Valente David, RN, MSN, [place] Care Management Care Management Coordinator [phone]

## 2022-06-29 ENCOUNTER — Ambulatory Visit: Payer: Medicare Other

## 2022-06-29 DIAGNOSIS — R2681 Unsteadiness on feet: Secondary | ICD-10-CM | POA: Diagnosis not present

## 2022-06-29 DIAGNOSIS — R262 Difficulty in walking, not elsewhere classified: Secondary | ICD-10-CM

## 2022-06-29 DIAGNOSIS — M6281 Muscle weakness (generalized): Secondary | ICD-10-CM | POA: Diagnosis not present

## 2022-06-29 NOTE — Therapy (Signed)
OUTPATIENT PHYSICAL THERAPY NEURO TREATMENT NOTE  Patient Name: Sydney Mcdaniel MRN: [id number] DOB:[birth year], 89 y.o., female Today's Date: 06/29/2022  PCP: Teodora Medici, DO REFERRING PROVIDER: Teodora Medici, DO    PT End of Session - 06/29/22 1310     Visit Number 14    Number of Visits 25    Date for PT Re-Evaluation 09/19/22    Authorization Time Period 04/03/22-06/26/2022    Progress Note Due on Visit 20    PT Start Time 0934    PT Stop Time 1015    PT Time Calculation (min) 41 min    Equipment Utilized During Treatment Gait belt    Activity Tolerance Patient tolerated treatment well;No increased pain    Behavior During Therapy WFL for tasks assessed/performed               Past Medical History:  Diagnosis Date   Allergy    Anemia    Cardiomyopathy ([place])    a. 02/2019 Echo: EF 60-65%, no rwma, doppler parameters consistent w/ pseudonormalization. Nl RV size/fxn; b. 06/2021 Echo: EF 40-45% w/ sev basal-mid antsept HK. GrII DD. Nl RV size/fxn. Mild MR/AI.   Carotid arterial disease ([place])    a. 04/2020 Carotid U/S: 1-39% bilat ICA stenosis, <50% bilat ECA stenosis; b. 06/2021 Carotid U/S: RICA 4-09%, LICA 81-19%.   Cataract    CLL (chronic lymphocytic leukemia) (HCC)    GERD (gastroesophageal reflux disease)    Glaucoma    H/O: hysterectomy    Total   History of stress test    a. 02/2019 MV: EF 60%, no ischemia/infarct.   Hyperlipidemia    Hypertension    Hypothyroidism    Impaired fasting glucose    Lichen sclerosus    Osteoporosis    Hips   PAF (paroxysmal atrial fibrillation) ([place])    a. 06/15/2016 Event monitor: 4% afib burden; b. CHA2DS2VASc - 4-->Xarelto.   Sinus bradycardia    a. avoid AVN blocking agents.   Syncope    a. 03/2016 Echo: EF 55-60%, no rwma, mild AI/MR, nl PASP; b. 03/2016 48h Holter: no significant arrhythmias/pauses; c. 03/2016 MV: mild apical defect, likely breast attenuation, nl EF, low risk; d. 05/2016 Event monitor: No  significant arrhythmia; e. 05/2016 Event monitor: PAF (4%).   Past Surgical History:  Procedure Laterality Date   APPENDECTOMY     EYE SURGERY     Glaucoma   MULTIPLE TOOTH EXTRACTIONS N/A    2 teeth   PACEMAKER IMPLANT N/A 09/22/2021   Procedure: PACEMAKER IMPLANT;  Surgeon: Deboraha Sprang, MD;  Location: [facility];  Service: Cardiovascular;  Laterality: N/A;   TOTAL ABDOMINAL HYSTERECTOMY     Patient Active Problem List   Diagnosis Date Noted   HFrEF (heart failure with reduced ejection fraction) ([place]) 05/06/2022   Sick sinus syndrome ([place]) 05/06/2022   General weakness 02/01/2022   Arm pain, anterior, right 01/30/2022   SIADH (syndrome of inappropriate ADH production) ([place]) 07/05/2021   Aspiration pneumonia ([place]) 05/21/2021   Elevated troponin 05/20/2021   Fall 02/11/2021   Normocytic anemia 05/31/2020   Goals of care, counseling/discussion 03/08/2020   Positive direct antiglobulin test (DAT) 12/04/2019   Sinus bradycardia 04/09/2019   First degree AV block 04/09/2019   Osteoporosis 08/21/2018   Bilateral carotid artery stenosis 04/19/2018   History of syncope 10/17/2017   MCI (mild cognitive impairment) 08/12/2017   Lung nodules 07/09/2017   Orthostatic lightheadedness 04/18/2017   Paroxysmal atrial fibrillation ([place]) 04/18/2017  Hyponatremia 10/20/2016   Chronic diastolic congestive heart failure ([place]) 06/28/2016   Syncope, near 05/24/2016   Primary open-angle glaucoma, bilateral, mild stage 05/08/2016   Bladder prolapse, female, acquired 02/25/2016   Asymptomatic bacteriuria 02/25/2016   B12 deficiency 01/11/2016   Anemia 01/10/2016   CLL (chronic lymphocytic leukemia) ([place]) 01/10/2016   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 12/21/2015   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 12/21/2015   Bradycardia 12/21/2015   Lymphocytosis 06/25/2015   Impaired fasting glucose 06/22/2015   Carotid atherosclerosis 06/22/2015   Essential hypertension  12/22/2014   Hyperlipidemia 12/22/2014   Hypothyroidism 12/22/2014    ONSET DATE: 01/30/2022  REFERRING DIAG: R26.9 (ICD-10-CM) - Abnormality of gait and mobility  THERAPY DIAG:  Muscle weakness (generalized)  Unsteadiness on feet  Difficulty in walking, not elsewhere classified  Rationale for Evaluation and Treatment Rehabilitation  SUBJECTIVE:   SUBJECTIVE STATEMENT:  Pt DTR says pt about back to where she was pre-covid illness. Pt reports no pain, no stumbles/falls. Pt has been using a floor-elliptical at home.   [name]: family member [name]  PERTINENT HISTORY: [name] back in 2022 for weakness, imbalance. She returns for generalized weakness, balance issues and falls following hospitalization 01/30/22-01/31/22 for acute on chronic hyponatremia, near syncope, RUE pain and generalized weakness. Pt biggest concern is her balance. 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   FALLS: Has patient fallen in last 6 months? Yes. Number of falls 1  LIVING ENVIRONMENT: Lives with: lives with their family and lives alone, but daughter lives next door Lives in: House/apartment Stairs:  pt has a ramp, but one step from back porch into the house (pt grabs onto shelves on R hand side) Has following equipment at home: Single point cane, Walker - 4 wheeled, Wheelchair (manual), shower chair, bed side commode, Grab bars, and Ramped  entry  PLOF: Independent  PATIENT GOALS: "I want more balance"    OBJECTIVE:   DIAGNOSTIC FINDINGS:   CT head WO contrast (5MM) 8/7/20203 per chart "IMPRESSION: No evidence of acute intracranial abnormality."  COGNITION: Overall cognitive status: History of cognitive impairments - at baseline per pt's daughter, mild cog impairment per chart   SENSATION: Pt daughter reports usually LLE impaired vs. RLE.  Pt able to feel light touch to BLE on exam  COORDINATION: WNL BUE and BLE   EDEMA:  Dtr reports swelling in LLE compared to RLE (reports chronic issue)   POSTURE: rounded shoulders, forward head, and increased thoracic kyphosis    LOWER EXTREMITY MMT:    Grossly 4+/5 BLE exception hip flexors 4-/5 BLE   TRANSFERS: Assistive device utilized: Single point cane  Sit to stand: Modified independence Stand to sit: Modified independence Chair to chair: Modified independence   GAIT: Gait pattern: step to pattern, decreased arm swing- Left, decreased stride length, and narrow BOS, dec B hip ext throughout gait cycle Distance walked: 10 m Assistive device utilized: Single point cane Level of assistance:  Close CGA, pt reports fear of falls/unsteadiness observed   FUNCTIONAL TESTs:  5 times sit to  stand: 28 seconds  Timed up and go (TUG): 32 sec with SPC 10 meter walk test: 0.45 m/s with SPC close CGA due to unsteadiness  Berg Balance Scale: deferred   PATIENT SURVEYS:  FOTO 38 (goal score 47)  TODAY'S TREATMENT:    Gait belt donned and CGA provided unless specified otherwise   There.ex:   LAQ 1 x 10 reps  x 2 sec hold/repwith 2.5# AW each LE. Rates medium  STS: 5x, warm-up  TherAct: goals retested on this date. Please refer to goal section below for details  PATIENT EDUCATION: Education details: Pt educated throughout session about proper posture and technique with exercises. Improved exercise technique, movement at target joints, use of target muscles  after min to mod verbal, visual, tactile cues. Goals, plan.  Person educated: Patient and Child(ren) (adult daughter) Education method: Explanation, Demonstration, Tactile cues, and Verbal cues Education comprehension: verbalized understanding, returned demonstration, verbal cues required, tactile cues required, and needs further education   HOME EXERCISE PROGRAM: No updates today, pt to continue plan as previously indicated Access Code: [email or web address] URL: [email or web address] Date: 04/03/2022 Prepared by: Ricard Dillon  Exercises - Seated March  - 1 x daily - 5 x weekly - 3 sets - 20 reps - alternating leg hold    GOALS: Goals reviewed with patient? Yes  SHORT TERM GOALS: Target date: 08/10/2022  Patient will be independent in home exercise program to improve strength/mobility for better functional independence with ADLs. Baseline: initiated; 06/27/21: pt HEP to be advanced; 06/29/22: to be advanced, pt DTR reports pt feels comfortable is indep Goal status: MET    LONG TERM GOALS: Target date: 09/21/2022  Patient will increase FOTO score to equal to or greater than  47   to demonstrate statistically significant improvement in mobility and quality of life.  Baseline: 38 05/25/22: 42; 1/2: deferred; 06/29/22: 44 Goal status: PARTIALLY MET  2.  Patient (> 59 years old) will complete five times sit to stand test in < 15 seconds indicating an increased LE strength and improved balance. Baseline: 28 sec use of UE 05/25/22: 19.57 sec use of UE's on knees; 1/2: deferred; `/4/24: 23 sec hands but hands-free Goal status: IN PROGRESS  3. Patient will increase 10 meter walk test to >1.72ms as to improve gait speed for better community ambulation and to reduce fall risk.  Baseline: 0.45 m/s w SPC, close CGA 05/25/22: 0.56 m/s w SPC, close CGA; 1/2: deferred; 1/4: 0.45 m/s with SPC close CGA  Goal status: IN PROGRESS  4.   Patient will reduce timed up and go to <15 seconds to reduce  fall risk and demonstrate improved transfer/gait ability. Baseline: 32 sec with SPC 05/25/22: 29.19 sec with SPC; 06/27/22: deferred; 06/29/22: 34 seconds with SPC and close CGA  Goal status: IN PROGRESS  5.   Patient will increase Berg Balance score by > 6 points to demonstrate decreased fall risk during functional activities. Baseline: to be completed next 1-2 sessions; 10/18: 32/56 05/25/22:  37/56; 06/27/22: deferred; 06/29/22: 38/56  Goal status: IN PROGRESS   ASSESSMENT:  CLINICAL IMPRESSION: Goal retesting performed on this date. Pt with mixed test performance, showing improvement on FOTO and BERG, indicating increased perceived functional mobility, QOL, and improved balance. However, pt with decrease on 5xSTS (although performed hands-free on this date), 10MWT, and TUG. These scores might have been impacted by recent COVID illness as these tests require more endurance. The pt will continue to benefit from skilled physical therapy intervention to address impairments,  improve QOL, and attain therapy goals.      OBJECTIVE IMPAIRMENTS Abnormal gait, decreased activity tolerance, decreased balance, decreased cognition, decreased endurance, decreased knowledge of use of DME, decreased mobility, difficulty walking, decreased strength, impaired vision/preception, improper body mechanics, postural dysfunction, and pain.   ACTIVITY LIMITATIONS squatting, stairs, transfers, and locomotion level  PARTICIPATION LIMITATIONS: meal prep, cleaning, laundry, shopping, community activity, and yard work  PERSONAL FACTORS Age, Fitness, Past/current experiences, Sex, and 3+ comorbidities: Per chart PMH significant for hypertension, hyperlipidemia, chronic lymphocytic leukemia, chronic hyponatremia and SIADH, generalized weakness, hypothyroidism, carotid atherosclerosis, lymphocytosis, neoplasm of uncertain behavior of skin of ear and nose, B12 deficiency, bladder prolapse, glaucoma, Chronic diastolic congestive  heart failure, paroxysmal atria fibrillation, lung nodules, mild cognitive impairment, osteoporosis, first degree AV block, normocytic anemia, falls,   are also affecting patient's functional outcome.   REHAB POTENTIAL: Good  CLINICAL DECISION MAKING: Evolving/moderate complexity  EVALUATION COMPLEXITY: Moderate  PLAN: PT FREQUENCY: 2x/week  PT DURATION: 12 weeks  PLANNED INTERVENTIONS: Therapeutic exercises, Therapeutic activity, Neuromuscular re-education, Balance training, Gait training, Patient/Family education, Self Care, Joint mobilization, Stair training, Vestibular training, Canalith repositioning, Orthotic/Fit training, DME instructions, Wheelchair mobility training, Spinal mobilization, Cryotherapy, Moist heat, scar mobilization, Splintting, Taping, Ultrasound, Parrafin, Biofeedback, Manual therapy, and Re-evaluation  PLAN FOR NEXT SESSION:   Strength, balance, SPC gait training, continue plan   Zollie Pee PT  06/29/22, 1:16 PM

## 2022-07-04 ENCOUNTER — Ambulatory Visit: Payer: Medicare Other

## 2022-07-04 DIAGNOSIS — M6281 Muscle weakness (generalized): Secondary | ICD-10-CM | POA: Diagnosis not present

## 2022-07-04 DIAGNOSIS — R2681 Unsteadiness on feet: Secondary | ICD-10-CM | POA: Diagnosis not present

## 2022-07-04 DIAGNOSIS — R262 Difficulty in walking, not elsewhere classified: Secondary | ICD-10-CM

## 2022-07-04 NOTE — Therapy (Signed)
OUTPATIENT PHYSICAL THERAPY NEURO TREATMENT NOTE  Patient Name: Sydney Mcdaniel MRN: [id number] DOB:Jun 16, 1925, 89 y.o., female Today's Date: 07/04/2022  PCP: Teodora Medici, DO REFERRING PROVIDER: Teodora Medici, DO    PT End of Session - 07/04/22 1611     Visit Number 15    Number of Visits 25    Date for PT Re-Evaluation 09/19/22    Authorization Time Period 04/03/22-06/26/2022    Progress Note Due on Visit 20    PT Start Time 0846    PT Stop Time 0928    PT Time Calculation (min) 42 min    Equipment Utilized During Treatment Gait belt    Activity Tolerance Patient tolerated treatment well;No increased pain    Behavior During Therapy WFL for tasks assessed/performed               Past Medical History:  Diagnosis Date   Allergy    Anemia    Cardiomyopathy ([place])    a. 02/2019 Echo: EF 60-65%, no rwma, doppler parameters consistent w/ pseudonormalization. Nl RV size/fxn; b. 06/2021 Echo: EF 40-45% w/ sev basal-mid antsept HK. GrII DD. Nl RV size/fxn. Mild MR/AI.   Carotid arterial disease ([place])    a. 04/2020 Carotid U/S: 1-39% bilat ICA stenosis, <50% bilat ECA stenosis; b. 06/2021 Carotid U/S: RICA 2-87%, LICA 86-76%.   Cataract    CLL (chronic lymphocytic leukemia) (HCC)    GERD (gastroesophageal reflux disease)    Glaucoma    H/O: hysterectomy    Total   History of stress test    a. 02/2019 MV: EF 60%, no ischemia/infarct.   Hyperlipidemia    Hypertension    Hypothyroidism    Impaired fasting glucose    Lichen sclerosus    Osteoporosis    Hips   PAF (paroxysmal atrial fibrillation) ([place])    a. 06/15/2016 Event monitor: 4% afib burden; b. CHA2DS2VASc - 4-->Xarelto.   Sinus bradycardia    a. avoid AVN blocking agents.   Syncope    a. 03/2016 Echo: EF 55-60%, no rwma, mild AI/MR, nl PASP; b. 03/2016 48h Holter: no significant arrhythmias/pauses; c. 03/2016 MV: mild apical defect, likely breast attenuation, nl EF, low risk; d. 05/2016 Event monitor: No  significant arrhythmia; e. 05/2016 Event monitor: PAF (4%).   Past Surgical History:  Procedure Laterality Date   APPENDECTOMY     EYE SURGERY     Glaucoma   MULTIPLE TOOTH EXTRACTIONS N/A    2 teeth   PACEMAKER IMPLANT N/A 09/22/2021   Procedure: PACEMAKER IMPLANT;  Surgeon: Deboraha Sprang, MD;  Location: [facility];  Service: Cardiovascular;  Laterality: N/A;   TOTAL ABDOMINAL HYSTERECTOMY     Patient Active Problem List   Diagnosis Date Noted   HFrEF (heart failure with reduced ejection fraction) ([place]) 05/06/2022   Sick sinus syndrome ([place]) 05/06/2022   General weakness 02/01/2022   Arm pain, anterior, right 01/30/2022   SIADH (syndrome of inappropriate ADH production) ([place]) 07/05/2021   Aspiration pneumonia ([place]) 05/21/2021   Elevated troponin 05/20/2021   Fall 02/11/2021   Normocytic anemia 05/31/2020   Goals of care, counseling/discussion 03/08/2020   Positive direct antiglobulin test (DAT) 12/04/2019   Sinus bradycardia 04/09/2019   First degree AV block 04/09/2019   Osteoporosis 08/21/2018   Bilateral carotid artery stenosis 04/19/2018   History of syncope 10/17/2017   MCI (mild cognitive impairment) 08/12/2017   Lung nodules 07/09/2017   Orthostatic lightheadedness 04/18/2017   Paroxysmal atrial fibrillation ([place]) 04/18/2017  Hyponatremia 10/20/2016   Chronic diastolic congestive heart failure ([place]) 06/28/2016   Syncope, near 05/24/2016   Primary open-angle glaucoma, bilateral, mild stage 05/08/2016   Bladder prolapse, female, acquired 02/25/2016   Asymptomatic bacteriuria 02/25/2016   B12 deficiency 01/11/2016   Anemia 01/10/2016   CLL (chronic lymphocytic leukemia) ([place]) 01/10/2016   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 12/21/2015   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 12/21/2015   Bradycardia 12/21/2015   Lymphocytosis 06/25/2015   Impaired fasting glucose 06/22/2015   Carotid atherosclerosis 06/22/2015   Essential hypertension  12/22/2014   Hyperlipidemia 12/22/2014   Hypothyroidism 12/22/2014    ONSET DATE: 01/30/2022  REFERRING DIAG: R26.9 (ICD-10-CM) - Abnormality of gait and mobility  THERAPY DIAG:  Muscle weakness (generalized)  Unsteadiness on feet  Difficulty in walking, not elsewhere classified  Rationale for Evaluation and Treatment Rehabilitation  SUBJECTIVE:   SUBJECTIVE STATEMENT:  Pt reports no pain currently, no recent falls/stumbles. Pt consistently performing HEP. Pt recently had some BP medicine changed per son-In-law, Phil. Pt's BP has been running high per SIL.   [name]: family member SIL Phil  PERTINENT HISTORY: [name] back in 2022 for weakness, imbalance. She returns for generalized weakness, balance issues and falls following hospitalization 01/30/22-01/31/22 for acute on chronic hyponatremia, near syncope, RUE pain and generalized weakness. Pt biggest concern is her balance. 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   FALLS: Has patient fallen in last 6 months? Yes. Number of falls 1  LIVING ENVIRONMENT: Lives with: lives with their family and lives alone, but daughter lives next door Lives in: House/apartment Stairs:  pt has a ramp, but one step from back porch into the house (pt grabs onto shelves on R hand side) Has following equipment at home: Single point cane, Walker - 4 wheeled, Wheelchair (manual), shower chair, bed  side commode, Grab bars, and Ramped entry  PLOF: Independent  PATIENT GOALS: "I want more balance"    OBJECTIVE: taken at eval unless otherwise specified   DIAGNOSTIC FINDINGS:   CT head WO contrast (5MM) 8/7/20203 per chart "IMPRESSION: No evidence of acute intracranial abnormality."  COGNITION: Overall cognitive status: History of cognitive impairments - at baseline per pt's daughter, mild cog impairment per chart   SENSATION: Pt daughter reports usually LLE impaired vs. RLE.  Pt able to feel light touch to BLE on exam  COORDINATION: WNL BUE and BLE   EDEMA:  Dtr reports swelling in LLE compared to RLE (reports chronic issue)   POSTURE: rounded shoulders, forward head, and increased thoracic kyphosis    LOWER EXTREMITY MMT:    Grossly 4+/5 BLE exception hip flexors 4-/5 BLE   TRANSFERS: Assistive device utilized: Single point cane  Sit to stand: Modified independence Stand to sit: Modified independence Chair to chair: Modified independence   GAIT: Gait pattern: step to pattern, decreased arm swing- Left, decreased stride length, and narrow BOS, dec B hip ext throughout gait cycle Distance walked: 10 m Assistive device utilized: Single point cane Level of assistance:  Close CGA, pt reports fear of  falls/unsteadiness observed   FUNCTIONAL TESTs:  5 times sit to stand: 28 seconds  Timed up and go (TUG): 32 sec with SPC 10 meter walk test: 0.45 m/s with SPC close CGA due to unsteadiness  Berg Balance Scale: deferred   PATIENT SURVEYS:  FOTO 38 (goal score 47)  TODAY'S TREATMENT:  Gait belt donned and CGA provided unless specified otherwise  BP seated LUE: 144/59 mmHg, HR 65 bpm, no symptoms   There.ex:   Nustep interval training to promote LE and cardiorespiratory endurance:  Lvl 1 - 1 min 30 sec Lvl 2 - 1 min Lvl 3 - 2 min  Lvl 2 - 1 min Lvl 1 - 1 min Cuing for SPM range, encourages to achieve at least 40 SPM . Min assist for  mount/dismount  STS: 2x15. Rates medium. Cuing for upright posture.    LAQ 2 x 8 reps with 4# AW each LE. Rates medium   Seated March 2x8 each LE with 4# AW. Pt states, "that wasn't too bad," when asked about difficulty level.     NMR:  In // bars:  FWD/BCKWD stepping x multiple trials length of bars LTL stepping each direction x multiple reps length of bars Intermittent UE support throughout     PATIENT EDUCATION: Education details: Pt educated throughout session about proper posture and technique with exercises. Improved exercise technique, movement at target joints, use of target muscles after min to mod verbal, visual, tactile cues.   Person educated: Patient and son in Sports coach Education method: Explanation, Demonstration, Tactile cues, and Verbal cues Education comprehension: verbalized understanding, returned demonstration, verbal cues required, tactile cues required, and needs further education   HOME EXERCISE PROGRAM: No updates today, pt to continue plan as previously indicated Access Code: [email or web address] URL: [email or web address] Date: 04/03/2022 Prepared by: Ricard Dillon  Exercises - Seated March  - 1 x daily - 5 x weekly - 3 sets - 20 reps - alternating leg hold    GOALS: Goals reviewed with patient? Yes  SHORT TERM GOALS: Target date: 08/15/2022  Patient will be independent in home exercise program to improve strength/mobility for better functional independence with ADLs. Baseline: initiated; 06/27/21: pt HEP to be advanced; 06/29/22: to be advanced, pt DTR reports pt feels comfortable is indep Goal status: MET    LONG TERM GOALS: Target date: 09/26/2022  Patient will increase FOTO score to equal to or greater than  47   to demonstrate statistically significant improvement in mobility and quality of life.  Baseline: 38 05/25/22: 42; 1/2: deferred; 06/29/22: 44 Goal status: PARTIALLY MET  2.  Patient (> 74 years old) will complete five times sit to stand  test in < 15 seconds indicating an increased LE strength and improved balance. Baseline: 28 sec use of UE 05/25/22: 19.57 sec use of UE's on knees; 1/2: deferred; `/4/24: 23 sec hands but hands-free Goal status: IN PROGRESS  3. Patient will increase 10 meter walk test to >1.75ms as to improve gait speed for better community ambulation and to reduce fall risk.  Baseline: 0.45 m/s w SPC, close CGA 05/25/22: 0.56 m/s w SPC, close CGA; 1/2: deferred; 1/4: 0.45 m/s with SPC close CGA  Goal status: IN PROGRESS  4.   Patient will reduce timed up and go to <15 seconds to reduce fall risk and demonstrate improved transfer/gait ability. Baseline: 32 sec with SPC 05/25/22: 29.19 sec with SPC; 06/27/22: deferred; 06/29/22: 34 seconds with SPC and close CGA  Goal status: IN PROGRESS  5.  Patient will increase Berg Balance score by > 6 points to demonstrate decreased fall risk during functional activities. Baseline: to be completed next 1-2 sessions; 10/18: 32/56 05/25/22:  37/56; 06/27/22: deferred; 06/29/22: 38/56  Goal status: IN PROGRESS   ASSESSMENT:  CLINICAL IMPRESSION: Pt initiated more advanced dynamic balance training today. She generally required close CGA and intermittent UE support throughout. Pt exhibited decreased step length with all dynamic balance exercise, but was able to modestly improve this with VC/demo. The pt will continue to benefit from skilled physical therapy intervention to address impairments, improve QOL, and attain therapy goals.      OBJECTIVE IMPAIRMENTS Abnormal gait, decreased activity tolerance, decreased balance, decreased cognition, decreased endurance, decreased knowledge of use of DME, decreased mobility, difficulty walking, decreased strength, impaired vision/preception, improper body mechanics, postural dysfunction, and pain.   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   REHAB POTENTIAL: Good  CLINICAL DECISION MAKING: Evolving/moderate complexity  EVALUATION COMPLEXITY: Moderate  PLAN: PT FREQUENCY: 2x/week  PT DURATION: 12 weeks  PLANNED INTERVENTIONS: Therapeutic exercises, Therapeutic activity, Neuromuscular re-education, Balance training, Gait training, Patient/Family education, Self Care, Joint mobilization, Stair training, Vestibular training, Canalith repositioning, Orthotic/Fit training, DME instructions, Wheelchair mobility training, Spinal mobilization, Cryotherapy, Moist heat, scar mobilization, Splintting, Taping, Ultrasound, Parrafin, Biofeedback, Manual therapy, and Re-evaluation  PLAN FOR NEXT SESSION:   Strength, balance, SPC gait training, continue plan   Zollie Pee PT  07/04/22, 4:16 PM

## 2022-07-06 ENCOUNTER — Ambulatory Visit: Payer: Medicare Other

## 2022-07-06 DIAGNOSIS — R2681 Unsteadiness on feet: Secondary | ICD-10-CM | POA: Diagnosis not present

## 2022-07-06 DIAGNOSIS — R262 Difficulty in walking, not elsewhere classified: Secondary | ICD-10-CM

## 2022-07-06 DIAGNOSIS — M6281 Muscle weakness (generalized): Secondary | ICD-10-CM | POA: Diagnosis not present

## 2022-07-06 NOTE — Therapy (Signed)
OUTPATIENT PHYSICAL THERAPY NEURO TREATMENT NOTE  Patient Name: Sydney Mcdaniel MRN: [id number] DOB:Jan 07, 1925, 89 y.o., female Today's Date: 07/06/2022  PCP: Teodora Medici, DO REFERRING PROVIDER: Teodora Medici, DO    PT End of Session - 07/06/22 0840     Visit Number 16    Number of Visits 25    Date for PT Re-Evaluation 09/19/22    Authorization Time Period 04/03/22-06/26/2022    Progress Note Due on Visit 20    PT Start Time 0845    PT Stop Time 0928    PT Time Calculation (min) 43 min    Equipment Utilized During Treatment Gait belt    Activity Tolerance Patient tolerated treatment well;No increased pain    Behavior During Therapy WFL for tasks assessed/performed               Past Medical History:  Diagnosis Date   Allergy    Anemia    Cardiomyopathy ([place])    a. 02/2019 Echo: EF 60-65%, no rwma, doppler parameters consistent w/ pseudonormalization. Nl RV size/fxn; b. 06/2021 Echo: EF 40-45% w/ sev basal-mid antsept HK. GrII DD. Nl RV size/fxn. Mild MR/AI.   Carotid arterial disease ([place])    a. 04/2020 Carotid U/S: 1-39% bilat ICA stenosis, <50% bilat ECA stenosis; b. 06/2021 Carotid U/S: RICA 7-84%, LICA 69-62%.   Cataract    CLL (chronic lymphocytic leukemia) (HCC)    GERD (gastroesophageal reflux disease)    Glaucoma    H/O: hysterectomy    Total   History of stress test    a. 02/2019 MV: EF 60%, no ischemia/infarct.   Hyperlipidemia    Hypertension    Hypothyroidism    Impaired fasting glucose    Lichen sclerosus    Osteoporosis    Hips   PAF (paroxysmal atrial fibrillation) ([place])    a. 06/15/2016 Event monitor: 4% afib burden; b. CHA2DS2VASc - 4-->Xarelto.   Sinus bradycardia    a. avoid AVN blocking agents.   Syncope    a. 03/2016 Echo: EF 55-60%, no rwma, mild AI/MR, nl PASP; b. 03/2016 48h Holter: no significant arrhythmias/pauses; c. 03/2016 MV: mild apical defect, likely breast attenuation, nl EF, low risk; d. 05/2016 Event monitor: No  significant arrhythmia; e. 05/2016 Event monitor: PAF (4%).   Past Surgical History:  Procedure Laterality Date   APPENDECTOMY     EYE SURGERY     Glaucoma   MULTIPLE TOOTH EXTRACTIONS N/A    2 teeth   PACEMAKER IMPLANT N/A 09/22/2021   Procedure: PACEMAKER IMPLANT;  Surgeon: Deboraha Sprang, MD;  Location: [facility];  Service: Cardiovascular;  Laterality: N/A;   TOTAL ABDOMINAL HYSTERECTOMY     Patient Active Problem List   Diagnosis Date Noted   HFrEF (heart failure with reduced ejection fraction) ([place]) 05/06/2022   Sick sinus syndrome ([place]) 05/06/2022   General weakness 02/01/2022   Arm pain, anterior, right 01/30/2022   SIADH (syndrome of inappropriate ADH production) ([place]) 07/05/2021   Aspiration pneumonia ([place]) 05/21/2021   Elevated troponin 05/20/2021   Fall 02/11/2021   Normocytic anemia 05/31/2020   Goals of care, counseling/discussion 03/08/2020   Positive direct antiglobulin test (DAT) 12/04/2019   Sinus bradycardia 04/09/2019   First degree AV block 04/09/2019   Osteoporosis 08/21/2018   Bilateral carotid artery stenosis 04/19/2018   History of syncope 10/17/2017   MCI (mild cognitive impairment) 08/12/2017   Lung nodules 07/09/2017   Orthostatic lightheadedness 04/18/2017   Paroxysmal atrial fibrillation ([place]) 04/18/2017  Hyponatremia 10/20/2016   Chronic diastolic congestive heart failure ([place]) 06/28/2016   Syncope, near 05/24/2016   Primary open-angle glaucoma, bilateral, mild stage 05/08/2016   Bladder prolapse, female, acquired 02/25/2016   Asymptomatic bacteriuria 02/25/2016   B12 deficiency 01/11/2016   Anemia 01/10/2016   CLL (chronic lymphocytic leukemia) ([place]) 01/10/2016   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 12/21/2015   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 12/21/2015   Bradycardia 12/21/2015   Lymphocytosis 06/25/2015   Impaired fasting glucose 06/22/2015   Carotid atherosclerosis 06/22/2015   Essential hypertension  12/22/2014   Hyperlipidemia 12/22/2014   Hypothyroidism 12/22/2014    ONSET DATE: 01/30/2022  REFERRING DIAG: R26.9 (ICD-10-CM) - Abnormality of gait and mobility  THERAPY DIAG:  Muscle weakness (generalized)  Unsteadiness on feet  Difficulty in walking, not elsewhere classified  Rationale for Evaluation and Treatment Rehabilitation  SUBJECTIVE:   SUBJECTIVE STATEMENT:  Pt daughter reports pt's BP was a little high yesterday. Pt daughter reports no falls, that pt with no recent pain. Pt confirms this.    [name]: family member DTR  PERTINENT HISTORY: [name] back in 2022 for weakness, imbalance. She returns for generalized weakness, balance issues and falls following hospitalization 01/30/22-01/31/22 for acute on chronic hyponatremia, near syncope, RUE pain and generalized weakness. Pt biggest concern is her balance. 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   FALLS: Has patient fallen in last 6 months? Yes. Number of falls 1  LIVING ENVIRONMENT: Lives with: lives with their family and lives alone, but daughter lives next door Lives in: House/apartment Stairs:  pt has a ramp, but one step from back porch into the house (pt grabs onto shelves on R hand side) Has following equipment at home: Single point cane, Walker - 4 wheeled, Wheelchair (manual), shower chair, bed side commode, Grab bars, and Ramped entry  PLOF:  Independent  PATIENT GOALS: "I want more balance"    OBJECTIVE: taken at eval unless otherwise specified   DIAGNOSTIC FINDINGS:   CT head WO contrast (5MM) 8/7/20203 per chart "IMPRESSION: No evidence of acute intracranial abnormality."  COGNITION: Overall cognitive status: History of cognitive impairments - at baseline per pt's daughter, mild cog impairment per chart   SENSATION: Pt daughter reports usually LLE impaired vs. RLE.  Pt able to feel light touch to BLE on exam  COORDINATION: WNL BUE and BLE   EDEMA:  Dtr reports swelling in LLE compared to RLE (reports chronic issue)   POSTURE: rounded shoulders, forward head, and increased thoracic kyphosis    LOWER EXTREMITY MMT:    Grossly 4+/5 BLE exception hip flexors 4-/5 BLE   TRANSFERS: Assistive device utilized: Single point cane  Sit to stand: Modified independence Stand to sit: Modified independence Chair to chair: Modified independence   GAIT: Gait pattern: step to pattern, decreased arm swing- Left, decreased stride length, and narrow BOS, dec B hip ext throughout gait cycle Distance walked: 10 m Assistive device utilized: Single point cane Level of assistance:  Close CGA, pt reports fear of falls/unsteadiness observed   FUNCTIONAL TESTs:  5 times sit to stand: 28 seconds  Timed up and go (TUG): 32 sec with SPC 10 meter walk test: 0.45 m/s with SPC close CGA due to unsteadiness  Berg Balance Scale: deferred   PATIENT SURVEYS:  FOTO 38 (goal score 47)  TODAY'S TREATMENT:  Gait belt donned and CGA provided unless specified otherwise  BP seated LUE: 129/59 mmHg, HR 67 bpm, no symptoms   There.ex:   LAQ 3 x 8 reps with 5# AW each LE. Rates medium  Seated March 3x8 each LE with 5# AW. Pt states, "that wasn't too bad," when asked about difficulty level.   Standing hip abd 2x15 each LE   STS: 1x10, 1x11. Rates medium. Cuing for upright posture and anterior lean technique     Nustep interval  training to promote LE and cardiorespiratory endurance:  Lvl 1 - 2 performed in 30-60 sec intervals  Cuing for SPM range, pt achieves at least 40-50 SPM. Min assist for mount/dismount  NMR:  In // bars: several minutes of the following FWD/BCKWD stepping x multiple trials length of bars --then performs FWD/BCKWD gait with turns  LTL stepping each direction x multiple reps length of bars Intermittent UE support throughout    PATIENT EDUCATION: Education details: Pt educated throughout session about proper posture and technique with exercises. Improved exercise technique, movement at target joints, use of target muscles after min to mod verbal, visual, tactile cues.   Person educated: Patient and son in Sports coach Education method: Explanation, Demonstration, Tactile cues, and Verbal cues Education comprehension: verbalized understanding, returned demonstration, verbal cues required, tactile cues required, and needs further education   HOME EXERCISE PROGRAM: No updates today, pt to continue plan as previously indicated Access Code: [email or web address] URL: [email or web address] Date: 04/03/2022 Prepared by: Ricard Dillon  Exercises - Seated March  - 1 x daily - 5 x weekly - 3 sets - 20 reps - alternating leg hold    GOALS: Goals reviewed with patient? Yes  SHORT TERM GOALS: Target date: 08/17/2022  Patient will be independent in home exercise program to improve strength/mobility for better functional independence with ADLs. Baseline: initiated; 06/27/21: pt HEP to be advanced; 06/29/22: to be advanced, pt DTR reports pt feels comfortable is indep Goal status: MET    LONG TERM GOALS: Target date: 09/28/2022  Patient will increase FOTO score to equal to or greater than  47   to demonstrate statistically significant improvement in mobility and quality of life.  Baseline: 38 05/25/22: 42; 1/2: deferred; 06/29/22: 44 Goal status: PARTIALLY MET  2.  Patient (> 46 years old) will complete  five times sit to stand test in < 15 seconds indicating an increased LE strength and improved balance. Baseline: 28 sec use of UE 05/25/22: 19.57 sec use of UE's on knees; 1/2: deferred; `/4/24: 23 sec hands but hands-free Goal status: IN PROGRESS  3. Patient will increase 10 meter walk test to >1.13ms as to improve gait speed for better community ambulation and to reduce fall risk.  Baseline: 0.45 m/s w SPC, close CGA 05/25/22: 0.56 m/s w SPC, close CGA; 1/2: deferred; 1/4: 0.45 m/s with SPC close CGA  Goal status: IN PROGRESS  4.   Patient will reduce timed up and go to <15 seconds to reduce fall risk and demonstrate improved transfer/gait ability. Baseline: 32 sec with SPC 05/25/22: 29.19 sec with SPC; 06/27/22: deferred; 06/29/22: 34 seconds with SPC and close CGA  Goal status: IN PROGRESS  5.   Patient will increase BMerrilee Jansky  Balance score by > 6 points to demonstrate decreased fall risk during functional activities. Baseline: to be completed next 1-2 sessions; 10/18: 32/56 05/25/22:  37/56; 06/27/22: deferred; 06/29/22: 38/56  Goal status: IN PROGRESS   ASSESSMENT:  CLINICAL IMPRESSION: The pt is able to further advance strengthening and endurance therex with increased volume and intensity on this date. Pt most unsteady with turns with intermittent UE support in // bars. The pt will continue to benefit from skilled physical therapy intervention to address impairments, improve QOL, and attain therapy goals.      OBJECTIVE IMPAIRMENTS Abnormal gait, decreased activity tolerance, decreased balance, decreased cognition, decreased endurance, decreased knowledge of use of DME, decreased mobility, difficulty walking, decreased strength, impaired vision/preception, improper body mechanics, postural dysfunction, and pain.   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   REHAB POTENTIAL: Good  CLINICAL DECISION MAKING: Evolving/moderate complexity  EVALUATION COMPLEXITY: Moderate  PLAN: PT FREQUENCY: 2x/week  PT DURATION: 12 weeks  PLANNED INTERVENTIONS: Therapeutic exercises, Therapeutic activity, Neuromuscular re-education, Balance training, Gait training, Patient/Family education, Self Care, Joint mobilization, Stair training, Vestibular training, Canalith repositioning, Orthotic/Fit training, DME instructions, Wheelchair mobility training, Spinal mobilization, Cryotherapy, Moist heat, scar mobilization, Splintting, Taping, Ultrasound, Parrafin, Biofeedback, Manual therapy, and Re-evaluation  PLAN FOR NEXT SESSION:   Strength, balance, SPC gait training, continue plan   Zollie Pee PT  07/06/22, 9:39 AM

## 2022-07-11 ENCOUNTER — Ambulatory Visit: Payer: Medicare Other

## 2022-07-11 DIAGNOSIS — R2681 Unsteadiness on feet: Secondary | ICD-10-CM

## 2022-07-11 DIAGNOSIS — R262 Difficulty in walking, not elsewhere classified: Secondary | ICD-10-CM

## 2022-07-11 DIAGNOSIS — M6281 Muscle weakness (generalized): Secondary | ICD-10-CM

## 2022-07-11 NOTE — Therapy (Signed)
OUTPATIENT PHYSICAL THERAPY NEURO TREATMENT NOTE  Patient Name: Sydney Mcdaniel MRN: [id number] DOB:[birth year], 89 y.o., female Today's Date: 07/11/2022  PCP: Teodora Medici, DO REFERRING PROVIDER: Teodora Medici, DO    PT End of Session - 07/11/22 1300     Visit Number 17    Number of Visits 25    Date for PT Re-Evaluation 09/19/22    Authorization Time Period 04/03/22-06/26/2022    Progress Note Due on Visit 20    PT Start Time 0846    PT Stop Time 0930    PT Time Calculation (min) 44 min    Equipment Utilized During Treatment Gait belt    Activity Tolerance Patient tolerated treatment well;No increased pain    Behavior During Therapy WFL for tasks assessed/performed               Past Medical History:  Diagnosis Date   Allergy    Anemia    Cardiomyopathy ([place])    a. 02/2019 Echo: EF 60-65%, no rwma, doppler parameters consistent w/ pseudonormalization. Nl RV size/fxn; b. 06/2021 Echo: EF 40-45% w/ sev basal-mid antsept HK. GrII DD. Nl RV size/fxn. Mild MR/AI.   Carotid arterial disease ([place])    a. 04/2020 Carotid U/S: 1-39% bilat ICA stenosis, <50% bilat ECA stenosis; b. 06/2021 Carotid U/S: RICA 6-81%, LICA 27-51%.   Cataract    CLL (chronic lymphocytic leukemia) (HCC)    GERD (gastroesophageal reflux disease)    Glaucoma    H/O: hysterectomy    Total   History of stress test    a. 02/2019 MV: EF 60%, no ischemia/infarct.   Hyperlipidemia    Hypertension    Hypothyroidism    Impaired fasting glucose    Lichen sclerosus    Osteoporosis    Hips   PAF (paroxysmal atrial fibrillation) ([place])    a. 06/15/2016 Event monitor: 4% afib burden; b. CHA2DS2VASc - 4-->Xarelto.   Sinus bradycardia    a. avoid AVN blocking agents.   Syncope    a. 03/2016 Echo: EF 55-60%, no rwma, mild AI/MR, nl PASP; b. 03/2016 48h Holter: no significant arrhythmias/pauses; c. 03/2016 MV: mild apical defect, likely breast attenuation, nl EF, low risk; d. 05/2016 Event monitor: No  significant arrhythmia; e. 05/2016 Event monitor: PAF (4%).   Past Surgical History:  Procedure Laterality Date   APPENDECTOMY     EYE SURGERY     Glaucoma   MULTIPLE TOOTH EXTRACTIONS N/A    2 teeth   PACEMAKER IMPLANT N/A 09/22/2021   Procedure: PACEMAKER IMPLANT;  Surgeon: Deboraha Sprang, MD;  Location: [facility];  Service: Cardiovascular;  Laterality: N/A;   TOTAL ABDOMINAL HYSTERECTOMY     Patient Active Problem List   Diagnosis Date Noted   HFrEF (heart failure with reduced ejection fraction) ([place]) 05/06/2022   Sick sinus syndrome ([place]) 05/06/2022   General weakness 02/01/2022   Arm pain, anterior, right 01/30/2022   SIADH (syndrome of inappropriate ADH production) ([place]) 07/05/2021   Aspiration pneumonia ([place]) 05/21/2021   Elevated troponin 05/20/2021   Fall 02/11/2021   Normocytic anemia 05/31/2020   Goals of care, counseling/discussion 03/08/2020   Positive direct antiglobulin test (DAT) 12/04/2019   Sinus bradycardia 04/09/2019   First degree AV block 04/09/2019   Osteoporosis 08/21/2018   Bilateral carotid artery stenosis 04/19/2018   History of syncope 10/17/2017   MCI (mild cognitive impairment) 08/12/2017   Lung nodules 07/09/2017   Orthostatic lightheadedness 04/18/2017   Paroxysmal atrial fibrillation ([place]) 04/18/2017  Hyponatremia 10/20/2016   Chronic diastolic congestive heart failure ([place]) 06/28/2016   Syncope, near 05/24/2016   Primary open-angle glaucoma, bilateral, mild stage 05/08/2016   Bladder prolapse, female, acquired 02/25/2016   Asymptomatic bacteriuria 02/25/2016   B12 deficiency 01/11/2016   Anemia 01/10/2016   CLL (chronic lymphocytic leukemia) ([place]) 01/10/2016   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 12/21/2015   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 12/21/2015   Bradycardia 12/21/2015   Lymphocytosis 06/25/2015   Impaired fasting glucose 06/22/2015   Carotid atherosclerosis 06/22/2015   Essential hypertension  12/22/2014   Hyperlipidemia 12/22/2014   Hypothyroidism 12/22/2014    ONSET DATE: 01/30/2022  REFERRING DIAG: R26.9 (ICD-10-CM) - Abnormality of gait and mobility  THERAPY DIAG:  Muscle weakness (generalized)  Unsteadiness on feet  Difficulty in walking, not elsewhere classified  Rationale for Evaluation and Treatment Rehabilitation  SUBJECTIVE:   SUBJECTIVE STATEMENT:  Pt daughter says pt is suddenly having trouble opening jars/drawer. Has had OT in the past. Pt reports no pain, no other concerns/issues. Daughter is worried about pt's arm strength. Pt daughter reports  has had no recent falls/stumbles.    [name]: family member DTR  PERTINENT HISTORY: [name] back in 2022 for weakness, imbalance. She returns for generalized weakness, balance issues and falls following hospitalization 01/30/22-01/31/22 for acute on chronic hyponatremia, near syncope, RUE pain and generalized weakness. Pt biggest concern is her balance. 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   FALLS: Has patient fallen in last 6 months? Yes. Number of falls 1  LIVING ENVIRONMENT: Lives with: lives with their family and lives alone, but daughter lives next door Lives in: House/apartment Stairs:  pt has a ramp, but one step from back porch into the house (pt grabs onto shelves on R hand side) Has following equipment at home: Single point cane, Walker - 4  wheeled, Wheelchair (manual), shower chair, bed side commode, Grab bars, and Ramped entry  PLOF: Independent  PATIENT GOALS: "I want more balance"    OBJECTIVE: taken at eval unless otherwise specified   DIAGNOSTIC FINDINGS:   CT head WO contrast (5MM) 8/7/20203 per chart "IMPRESSION: No evidence of acute intracranial abnormality."  COGNITION: Overall cognitive status: History of cognitive impairments - at baseline per pt's daughter, mild cog impairment per chart   SENSATION: Pt daughter reports usually LLE impaired vs. RLE.  Pt able to feel light touch to BLE on exam  COORDINATION: WNL BUE and BLE   EDEMA:  Dtr reports swelling in LLE compared to RLE (reports chronic issue)   POSTURE: rounded shoulders, forward head, and increased thoracic kyphosis    LOWER EXTREMITY MMT:    Grossly 4+/5 BLE exception hip flexors 4-/5 BLE   TRANSFERS: Assistive device utilized: Single point cane  Sit to stand: Modified independence Stand to sit: Modified independence Chair to chair: Modified independence   GAIT: Gait pattern: step to pattern, decreased arm swing- Left, decreased stride length, and narrow BOS, dec B hip ext throughout gait cycle Distance walked: 10 m Assistive device utilized: Single point cane Level  of assistance:  Close CGA, pt reports fear of falls/unsteadiness observed   FUNCTIONAL TESTs:  5 times sit to stand: 28 seconds  Timed up and go (TUG): 32 sec with SPC 10 meter walk test: 0.45 m/s with SPC close CGA due to unsteadiness  Berg Balance Scale: deferred   PATIENT SURVEYS:  FOTO 38 (goal score 47)  TODAY'S TREATMENT:  Gait belt donned and CGA provided unless specified otherwise    There.ex:   Grip strength assessed, found to be 4/5 B   Initiated Theraputty Med-Sof, Ligth-green grpi strengthening x 2 min each UE  Postural intervention/UE strengthening:  -BTB rows 2x15 with multiple attempts/multi-modal cues to correct technique. Pt with  significant difficulty performing with correct technique  -Arms at side, band pull-aparts/shoulder ER 2x15 -Pt has continued difficulty with technique  STS 2x10 Seated LAQ 10x each LE with 2 sec hold/rep STS 2x10  NMR:  In // bars: several minutes of the following FWD/BCKWD stepping x multiple trials length of bars LTL stepping each direction x multiple reps length of bars Intermittent UE support throughout  Attempted reactive postural control training but pt with difficulty understanding task/instructions, so did not perform CGA-min a provided throughout   Vitals immediately post interventions: SPO2% 98-100% HR 80s-90s bpm  152/71 mmHg     PATIENT EDUCATION: Education details: Pt educated throughout session about proper posture and technique with exercises. Improved exercise technique, movement at target joints, use of target muscles after min to mod verbal, visual, tactile cues.   Person educated: Patient and son in law Education method: Explanation, Demonstration, Tactile cues, and Verbal cues Education comprehension: verbalized understanding, returned demonstration, verbal cues required, tactile cues required, and needs further education   HOME EXERCISE PROGRAM:   Instruction in use of theraputty  Access Code: [email or web address] URL: [email or web address] Date: 04/03/2022 Prepared by: Ricard Dillon  Exercises - Seated March  - 1 x daily - 5 x weekly - 3 sets - 20 reps - alternating leg hold    GOALS: Goals reviewed with patient? Yes  SHORT TERM GOALS: Target date: 08/22/2022  Patient will be independent in home exercise program to improve strength/mobility for better functional independence with ADLs. Baseline: initiated; 06/27/21: pt HEP to be advanced; 06/29/22: to be advanced, pt DTR reports pt feels comfortable is indep Goal status: MET    LONG TERM GOALS: Target date: 10/03/2022  Patient will increase FOTO score to equal to or greater than  47   to  demonstrate statistically significant improvement in mobility and quality of life.  Baseline: 38 05/25/22: 42; 1/2: deferred; 06/29/22: 44 Goal status: PARTIALLY MET  2.  Patient (> 56 years old) will complete five times sit to stand test in < 15 seconds indicating an increased LE strength and improved balance. Baseline: 28 sec use of UE 05/25/22: 19.57 sec use of UE's on knees; 1/2: deferred; `/4/24: 23 sec hands but hands-free Goal status: IN PROGRESS  3. Patient will increase 10 meter walk test to >1.71ms as to improve gait speed for better community ambulation and to reduce fall risk.  Baseline: 0.45 m/s w SPC, close CGA 05/25/22: 0.56 m/s w SPC, close CGA; 1/2: deferred; 1/4: 0.45 m/s with SPC close CGA  Goal status: IN PROGRESS  4.   Patient will reduce timed up and go to <15 seconds to reduce fall risk and demonstrate improved transfer/gait ability. [facility] 29.19 sec with SPC; 06/27/22: deferred; 06/29/22: 34 seconds with SPC and close CGA  Goal  status: IN PROGRESS  5.   Patient will increase Berg Balance score by > 6 points to demonstrate decreased fall risk during functional activities. Baseline: to be completed next 1-2 sessions; 10/18: 32/56 05/25/22:  37/56; 06/27/22: deferred; 06/29/22: 38/56  Goal status: IN PROGRESS   ASSESSMENT:  CLINICAL IMPRESSION: Pt instructed in postural/UE strengthening to promote UE grip strength and improved postural stability as she often stands with crouched/FWD posture. Pt with difficulty using correct technique even with multi-modal cues today. PT did instruct pt's daughter to monitor pt for other sudden changes (DTR mentioned noted decreased grip/UE strength recently) and to take pt to physician if she exhibits these sudden changes.  Vitals assessed and were WNL. The pt will continue to benefit from skilled physical therapy intervention to address impairments, improve QOL, and attain therapy goals.      OBJECTIVE  IMPAIRMENTS Abnormal gait, decreased activity tolerance, decreased balance, decreased cognition, decreased endurance, decreased knowledge of use of DME, decreased mobility, difficulty walking, decreased strength, impaired vision/preception, improper body mechanics, postural dysfunction, and pain.   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   REHAB POTENTIAL: Good  CLINICAL DECISION MAKING: Evolving/moderate complexity  EVALUATION COMPLEXITY: Moderate  PLAN: PT FREQUENCY: 2x/week  PT DURATION: 12 weeks  PLANNED INTERVENTIONS: Therapeutic exercises, Therapeutic activity, Neuromuscular re-education, Balance training, Gait training, Patient/Family education, Self Care, Joint mobilization, Stair training, Vestibular training, Canalith repositioning, Orthotic/Fit training, DME instructions, Wheelchair mobility training, Spinal mobilization, Cryotherapy, Moist heat, scar mobilization, Splintting, Taping, Ultrasound, Parrafin, Biofeedback, Manual therapy, and Re-evaluation  PLAN FOR NEXT SESSION:   Strength, balance, SPC gait training, continue plan   Zollie Pee PT  07/11/22, 1:14 PM

## 2022-07-13 ENCOUNTER — Ambulatory Visit: Payer: Medicare Other

## 2022-07-13 DIAGNOSIS — M6281 Muscle weakness (generalized): Secondary | ICD-10-CM | POA: Diagnosis not present

## 2022-07-13 DIAGNOSIS — R262 Difficulty in walking, not elsewhere classified: Secondary | ICD-10-CM

## 2022-07-13 DIAGNOSIS — R2681 Unsteadiness on feet: Secondary | ICD-10-CM | POA: Diagnosis not present

## 2022-07-13 NOTE — Therapy (Signed)
OUTPATIENT PHYSICAL THERAPY NEURO TREATMENT NOTE  Patient Name: Sydney Mcdaniel MRN: [id number] DOB:06/30/1924, 89 y.o., female Today's Date: 07/13/2022  PCP: Teodora Medici, DO REFERRING PROVIDER: Teodora Medici, DO    PT End of Session - 07/13/22 0842     Visit Number 18    Number of Visits 25    Date for PT Re-Evaluation 09/19/22    Authorization Time Period 04/03/22-06/26/2022    Progress Note Due on Visit 20    PT Start Time 0846    PT Stop Time 0930    PT Time Calculation (min) 44 min    Equipment Utilized During Treatment Gait belt    Activity Tolerance Patient tolerated treatment well;No increased pain    Behavior During Therapy WFL for tasks assessed/performed               Past Medical History:  Diagnosis Date   Allergy    Anemia    Cardiomyopathy ([place])    a. 02/2019 Echo: EF 60-65%, no rwma, doppler parameters consistent w/ pseudonormalization. Nl RV size/fxn; b. 06/2021 Echo: EF 40-45% w/ sev basal-mid antsept HK. GrII DD. Nl RV size/fxn. Mild MR/AI.   Carotid arterial disease ([place])    a. 04/2020 Carotid U/S: 1-39% bilat ICA stenosis, <50% bilat ECA stenosis; b. 06/2021 Carotid U/S: RICA 9-51%, LICA 88-41%.   Cataract    CLL (chronic lymphocytic leukemia) (HCC)    GERD (gastroesophageal reflux disease)    Glaucoma    H/O: hysterectomy    Total   History of stress test    a. 02/2019 MV: EF 60%, no ischemia/infarct.   Hyperlipidemia    Hypertension    Hypothyroidism    Impaired fasting glucose    Lichen sclerosus    Osteoporosis    Hips   PAF (paroxysmal atrial fibrillation) ([place])    a. 06/15/2016 Event monitor: 4% afib burden; b. CHA2DS2VASc - 4-->Xarelto.   Sinus bradycardia    a. avoid AVN blocking agents.   Syncope    a. 03/2016 Echo: EF 55-60%, no rwma, mild AI/MR, nl PASP; b. 03/2016 48h Holter: no significant arrhythmias/pauses; c. 03/2016 MV: mild apical defect, likely breast attenuation, nl EF, low risk; d. 05/2016 Event monitor: No  significant arrhythmia; e. 05/2016 Event monitor: PAF (4%).   Past Surgical History:  Procedure Laterality Date   APPENDECTOMY     EYE SURGERY     Glaucoma   MULTIPLE TOOTH EXTRACTIONS N/A    2 teeth   PACEMAKER IMPLANT N/A 09/22/2021   Procedure: PACEMAKER IMPLANT;  Surgeon: Deboraha Sprang, MD;  Location: [facility];  Service: Cardiovascular;  Laterality: N/A;   TOTAL ABDOMINAL HYSTERECTOMY     Patient Active Problem List   Diagnosis Date Noted   HFrEF (heart failure with reduced ejection fraction) ([place]) 05/06/2022   Sick sinus syndrome ([place]) 05/06/2022   General weakness 02/01/2022   Arm pain, anterior, right 01/30/2022   SIADH (syndrome of inappropriate ADH production) ([place]) 07/05/2021   Aspiration pneumonia ([place]) 05/21/2021   Elevated troponin 05/20/2021   Fall 02/11/2021   Normocytic anemia 05/31/2020   Goals of care, counseling/discussion 03/08/2020   Positive direct antiglobulin test (DAT) 12/04/2019   Sinus bradycardia 04/09/2019   First degree AV block 04/09/2019   Osteoporosis 08/21/2018   Bilateral carotid artery stenosis 04/19/2018   History of syncope 10/17/2017   MCI (mild cognitive impairment) 08/12/2017   Lung nodules 07/09/2017   Orthostatic lightheadedness 04/18/2017   Paroxysmal atrial fibrillation ([place]) 04/18/2017  Hyponatremia 10/20/2016   Chronic diastolic congestive heart failure ([place]) 06/28/2016   Syncope, near 05/24/2016   Primary open-angle glaucoma, bilateral, mild stage 05/08/2016   Bladder prolapse, female, acquired 02/25/2016   Asymptomatic bacteriuria 02/25/2016   B12 deficiency 01/11/2016   Anemia 01/10/2016   CLL (chronic lymphocytic leukemia) ([place]) 01/10/2016   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 12/21/2015   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 12/21/2015   Bradycardia 12/21/2015   Lymphocytosis 06/25/2015   Impaired fasting glucose 06/22/2015   Carotid atherosclerosis 06/22/2015   Essential hypertension  12/22/2014   Hyperlipidemia 12/22/2014   Hypothyroidism 12/22/2014    ONSET DATE: 01/30/2022  REFERRING DIAG: R26.9 (ICD-10-CM) - Abnormality of gait and mobility  THERAPY DIAG:  Muscle weakness (generalized)  Unsteadiness on feet  Difficulty in walking, not elsewhere classified  Rationale for Evaluation and Treatment Rehabilitation  SUBJECTIVE:   SUBJECTIVE STATEMENT:  Pt reports no aches/pains. Pt reports no stumbles/falls. No other changes.    [name]: family member DTR  PERTINENT HISTORY: [name] back in 2022 for weakness, imbalance. She returns for generalized weakness, balance issues and falls following hospitalization 01/30/22-01/31/22 for acute on chronic hyponatremia, near syncope, RUE pain and generalized weakness. Pt biggest concern is her balance. 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   FALLS: Has patient fallen in last 6 months? Yes. Number of falls 1  LIVING ENVIRONMENT: Lives with: lives with their family and lives alone, but daughter lives next door Lives in: House/apartment Stairs:  pt has a ramp, but one step from back porch into the house (pt grabs onto shelves on R hand side) Has following equipment at home: Single point cane, Walker - 4 wheeled, Wheelchair (manual), shower chair, bed side commode, Grab bars, and Ramped entry  PLOF: Independent  PATIENT GOALS: "I want more balance"    OBJECTIVE:  taken at eval unless otherwise specified   DIAGNOSTIC FINDINGS:   CT head WO contrast (5MM) 8/7/20203 per chart "IMPRESSION: No evidence of acute intracranial abnormality."  COGNITION: Overall cognitive status: History of cognitive impairments - at baseline per pt's daughter, mild cog impairment per chart   SENSATION: Pt daughter reports usually LLE impaired vs. RLE.  Pt able to feel light touch to BLE on exam  COORDINATION: WNL BUE and BLE   EDEMA:  Dtr reports swelling in LLE compared to RLE (reports chronic issue)   POSTURE: rounded shoulders, forward head, and increased thoracic kyphosis    LOWER EXTREMITY MMT:    Grossly 4+/5 BLE exception hip flexors 4-/5 BLE   TRANSFERS: Assistive device utilized: Single point cane  Sit to stand: Modified independence Stand to sit: Modified independence Chair to chair: Modified independence   GAIT: Gait pattern: step to pattern, decreased arm swing- Left, decreased stride length, and narrow BOS, dec B hip ext throughout gait cycle Distance walked: 10 m Assistive device utilized: Single point cane Level of assistance:  Close CGA, pt reports fear of falls/unsteadiness observed   FUNCTIONAL TESTs:  5 times sit to stand: 28 seconds  Timed up and go (  TUG): 32 sec with SPC 10 meter walk test: 0.45 m/s with SPC close CGA due to unsteadiness  Berg Balance Scale: deferred   PATIENT SURVEYS:  FOTO 38 (goal score 47)  TODAY'S TREATMENT:  Gait belt donned and CGA provided unless specified otherwise    There.ex:   4# ankle weights: LAQ 2x10 each LE March 2x10 each LE Seated heel raises 2x15 Rates medium Seated DF 2x15 B  STS 15x, 10x  RTB Rows 3x12 B RTB hamstring curls 2x12 each LE  NMR:  In // bars: several minutes of the following FWD/BCKWD stepping x multiple trials length of bars LTL stepping each direction x multiple reps length of bars FWD/BCKWD and LTL stepping over orange hurdle Intermittent UE support  throughout  CGA-min a provided throughout   PATIENT EDUCATION: Education details: Pt educated throughout session about proper posture and technique with exercises. Improved exercise technique, movement at target joints, use of target muscles after min to mod verbal, visual, tactile cues.   Person educated: Patient and son in law Education method: Explanation, Demonstration, Tactile cues, and Verbal cues Education comprehension: verbalized understanding, returned demonstration, verbal cues required, tactile cues required, and needs further education   HOME EXERCISE PROGRAM:   Instruction in use of theraputty  Access Code: [email or web address] URL: [email or web address] Date: 04/03/2022 Prepared by: Ricard Dillon  Exercises - Seated March  - 1 x daily - 5 x weekly - 3 sets - 20 reps - alternating leg hold    GOALS: Goals reviewed with patient? Yes  SHORT TERM GOALS: Target date: 08/24/2022  Patient will be independent in home exercise program to improve strength/mobility for better functional independence with ADLs. Baseline: initiated; 06/27/21: pt HEP to be advanced; 06/29/22: to be advanced, pt DTR reports pt feels comfortable is indep Goal status: MET    LONG TERM GOALS: Target date: 10/05/2022  Patient will increase FOTO score to equal to or greater than  47   to demonstrate statistically significant improvement in mobility and quality of life.  Baseline: 38 05/25/22: 42; 1/2: deferred; 06/29/22: 44 Goal status: PARTIALLY MET  2.  Patient (> 47 years old) will complete five times sit to stand test in < 15 seconds indicating an increased LE strength and improved balance. Baseline: 28 sec use of UE 05/25/22: 19.57 sec use of UE's on knees; 1/2: deferred; `/4/24: 23 sec hands but hands-free Goal status: IN PROGRESS  3. Patient will increase 10 meter walk test to >1.48ms as to improve gait speed for better community ambulation and to reduce fall risk.  Baseline: 0.45 m/s w  SPC, close CGA 05/25/22: 0.56 m/s w SPC, close CGA; 1/2: deferred; 1/4: 0.45 m/s with SPC close CGA  Goal status: IN PROGRESS  4.   Patient will reduce timed up and go to <15 seconds to reduce fall risk and demonstrate improved transfer/gait ability. Baseline: 32 sec with SPC 05/25/22: 29.19 sec with SPC; 06/27/22: deferred; 06/29/22: 34 seconds with SPC and close CGA  Goal status: IN PROGRESS  5.   Patient will increase Berg Balance score by > 6 points to demonstrate decreased fall risk during functional activities. Baseline: to be completed next 1-2 sessions; 10/18: 32/56 05/25/22:  37/56; 06/27/22: deferred; 06/29/22: 38/56  Goal status: IN PROGRESS   ASSESSMENT:  CLINICAL IMPRESSION: Pt advanced dynamic balance tasks to include obstacle clearance. She was able to perform this with intermittent UE support on bar. She required min cuing to improve step-length and showed quick within-session improvement in technique. While  pt showed progress, she fatigued quickly with LE hamstring strengthening and continued to be challenged with UE therex. The pt will continue to benefit from skilled physical therapy intervention to address impairments, improve QOL, and attain therapy goals.      OBJECTIVE IMPAIRMENTS Abnormal gait, decreased activity tolerance, decreased balance, decreased cognition, decreased endurance, decreased knowledge of use of DME, decreased mobility, difficulty walking, decreased strength, impaired vision/preception, improper body mechanics, postural dysfunction, and pain.   ACTIVITY LIMITATIONS squatting, stairs, transfers, and locomotion level  PARTICIPATION LIMITATIONS: meal prep, cleaning, laundry, shopping, community activity, and yard work  PERSONAL FACTORS Age, Fitness, Past/current experiences, Sex, and 3+ comorbidities: Per chart PMH significant for hypertension, hyperlipidemia, chronic lymphocytic leukemia, chronic hyponatremia and SIADH, generalized weakness,  hypothyroidism, carotid atherosclerosis, lymphocytosis, neoplasm of uncertain behavior of skin of ear and nose, B12 deficiency, bladder prolapse, glaucoma, Chronic diastolic congestive heart failure, paroxysmal atria fibrillation, lung nodules, mild cognitive impairment, osteoporosis, first degree AV block, normocytic anemia, falls,   are also affecting patient's functional outcome.   REHAB POTENTIAL: Good  CLINICAL DECISION MAKING: Evolving/moderate complexity  EVALUATION COMPLEXITY: Moderate  PLAN: PT FREQUENCY: 2x/week  PT DURATION: 12 weeks  PLANNED INTERVENTIONS: Therapeutic exercises, Therapeutic activity, Neuromuscular re-education, Balance training, Gait training, Patient/Family education, Self Care, Joint mobilization, Stair training, Vestibular training, Canalith repositioning, Orthotic/Fit training, DME instructions, Wheelchair mobility training, Spinal mobilization, Cryotherapy, Moist heat, scar mobilization, Splintting, Taping, Ultrasound, Parrafin, Biofeedback, Manual therapy, and Re-evaluation  PLAN FOR NEXT SESSION:   Strength, balance, SPC gait training, continue plan   Zollie Pee PT  07/13/22, 10:50 AM

## 2022-07-17 NOTE — Progress Notes (Signed)
Cardiology Clinic Note   Patient Name: Sydney Mcdaniel Date of Encounter: 07/18/2022  Primary Care Provider:  Teodora Medici, DO Primary Cardiologist:  Nelva Bush, MD  Patient Profile    89 year old female with past medical history of HFrEF, paroxysmal atrial fibrillation, syncope, carotid atherosclerosis, sick sinus syndrome status post pacemaker placement in March 2023, hypertension, hyperlipidemia, who presents to clinic today for follow-up on paroxysmal atrial fibrillation and HFrEF.  Past Medical History    Past Medical History:  Diagnosis Date   Allergy    Anemia    Cardiomyopathy ([place])    a. 02/2019 Echo: EF 60-65%, no rwma, doppler parameters consistent w/ pseudonormalization. Nl RV size/fxn; b. 06/2021 Echo: EF 40-45% w/ sev basal-mid antsept HK. GrII DD. Nl RV size/fxn. Mild MR/AI.   Carotid arterial disease ([place])    a. 04/2020 Carotid U/S: 1-39% bilat ICA stenosis, <50% bilat ECA stenosis; b. 06/2021 Carotid U/S: RICA 6-33%, LICA 35-45%.   Cataract    CLL (chronic lymphocytic leukemia) (HCC)    GERD (gastroesophageal reflux disease)    Glaucoma    H/O: hysterectomy    Total   History of stress test    a. 02/2019 MV: EF 60%, no ischemia/infarct.   Hyperlipidemia    Hypertension    Hypothyroidism    Impaired fasting glucose    Lichen sclerosus    Osteoporosis    Hips   PAF (paroxysmal atrial fibrillation) ([place])    a. 06/15/2016 Event monitor: 4% afib burden; b. CHA2DS2VASc - 4-->Xarelto.   Sinus bradycardia    a. avoid AVN blocking agents.   Syncope    a. 03/2016 Echo: EF 55-60%, no rwma, mild AI/MR, nl PASP; b. 03/2016 48h Holter: no significant arrhythmias/pauses; c. 03/2016 MV: mild apical defect, likely breast attenuation, nl EF, low risk; d. 05/2016 Event monitor: No significant arrhythmia; e. 05/2016 Event monitor: PAF (4%).   Past Surgical History:  Procedure Laterality Date   APPENDECTOMY     EYE SURGERY     Glaucoma   MULTIPLE TOOTH  EXTRACTIONS N/A    2 teeth   PACEMAKER IMPLANT N/A 09/22/2021   Procedure: PACEMAKER IMPLANT;  Surgeon: Deboraha Sprang, MD;  Location: [facility];  Service: Cardiovascular;  Laterality: N/A;   TOTAL ABDOMINAL HYSTERECTOMY      Allergies  Allergies  Allergen Reactions   Alphagan [Brimonidine] Itching and Rash   Pravachol [Pravastatin Sodium] Itching and Rash    History of Present Illness    Sydney Mcdaniel is a 89 year old female with previously mentioned past medical history of syncope and vasovagal with stress testing completed in 04/10/2016 with normal low risk study, echocardiogram in 04/17/2016 revealed LVEF of 55 to 60%, mild AR, mild MR, 40-hour Holter monitor revealed first-degree AV block PACs and PVCs, paroxysmal atrial fibrillation 30-day event monitor in 1/25 2018 revealed evidence of atrial fibrillation 4% total burden, HFrEF with stress testing MRI on/2021 normal liver study, echocardiogram in 02/2019 EF 60-65%, mild MR and mild TR, sick sinus syndrome with a 14-day ZIO showed minimum heart rate of 37 bpm with tacky bradycardia noted on monitor and symptomatic bradycardia symptoms recommended pacemaker implementation where on 09/22/2021 she had a Biotronik pacemaker placed, carotid artery disease with carotid duplexes completed with the last one in 06/2021 that revealed a right ICA 1-39%, left ICA 40 to 59% unchanged from study in 2021.  She was last seen in clinic on 06/23/2022 by Mayra Reel, NP where overall she was doing  well until that morning where she had a weak spell while she was leaving her house.  The daughter reports she perked up on the ride over.  And she has had a 10 pound unintentional weight loss the daughter has noted decreased appetite recent COVID infection, and having several teeth pulled she was not eating as much.  So amlodipine was held for systolic blood pressure less than 142 to continue to keep a blood pressure log at home and will be  reevaluated in the end of the month.  She returns to clinic today accompanied by her daughter and states that she is feeling well. She feels as if she is back to her baseline at this time. She has been keeping a log of her blood pressures at home and they are much improved. She denies any lightheadedness.dizziness, chest pain, shortness of breath, or peripheral edema. She has been able to resume physical therapy twice weekly. Denies any hospitalizations or visits to the emergency department.   Home Medications    Current Outpatient Medications  Medication Sig Dispense Refill   acetaminophen (TYLENOL) 500 MG tablet Take 500 mg by mouth every 8 (eight) hours as needed for moderate pain.     alendronate (FOSAMAX) 70 MG tablet TAKE 1 TABLET BY MOUTH EVERY FRIDAY ON AN EMPTY STOMACH WITH A FULL GLASS OF WATER 12 tablet 1   amLODipine (NORVASC) 5 MG tablet Take 0.5 tablets (2.5 mg total) by mouth daily. Hold for a blood pressure less than 716 systolic (top number) 967 tablet 3   atorvastatin (LIPITOR) 20 MG tablet Take 1 tablet (20 mg total) by mouth every evening. 90 tablet 1   Cholecalciferol 25 MCG (1000 UT) tablet Take by mouth.     ferrous sulfate 325 (65 FE) MG tablet Take 2 tablets (650 mg total) by mouth daily with breakfast.     LATANOPROST OP Place 1 drop into the right eye at bedtime.     levothyroxine (EUTHYROX) 75 MCG tablet TAKE ONE TABLET BY MOUTH ON MONDAYS, WEDNESDAYS, FRIDAYS, AND SUNDAYS 52 tablet 2   levothyroxine (SYNTHROID) 88 MCG tablet TAKE 1 TABLET BY MOUTH ON TUESDAYS, THURSDAYS AND SATURDAYS 39 tablet 0   Loteprednol Etabonate (LOTEMAX OP) Apply 1 drop to eye at bedtime. To right eye     ondansetron (ZOFRAN) 8 MG tablet Take 8 mg by mouth as needed.     polyethylene glycol (MIRALAX / GLYCOLAX) 17 g packet Take 17 g by mouth daily.     Rivaroxaban (XARELTO) 15 MG TABS tablet TAKE 1 TABLET EVERY DAY WITH SUPPER 90 tablet 1   sodium chloride (MURO 128) 5 % ophthalmic solution  Place 1 drop into the right eye 2 (two) times daily.     sodium chloride 1 g tablet Take 1 g by mouth. Takes 1/2 tablet with breakfast and lunch and 1 whole tablet with supper     spironolactone (ALDACTONE) 25 MG tablet Take 1 tablet by mouth once daily 90 tablet 0   TIMOLOL MALEATE PF OP Place 1 drop into the right eye in the morning and at bedtime.     vitamin B-12 (CYANOCOBALAMIN) 100 MCG tablet Take 100 mcg by mouth daily.     lisinopril (ZESTRIL) 40 MG tablet Take 1 tablet (40 mg total) by mouth daily. 90 tablet 3   Netarsudil Dimesylate (RHOPRESSA) 0.02 % SOLN Place 1 drop into the right eye at bedtime. (Patient not taking: Reported on 07/18/2022)     No current facility-administered medications  for this visit.     Family History    Family History  Problem Relation Age of Onset   Heart attack Mother    Glaucoma Mother    Heart attack Father    Parkinson's disease Brother    Heart attack Brother    She indicated that her mother is deceased. She indicated that her father is deceased. She indicated that her sister is deceased. She indicated that all of her three brothers are deceased. She indicated that her maternal grandmother is deceased. She indicated that her maternal grandfather is deceased. She indicated that her paternal grandmother is deceased. She indicated that her paternal grandfather is deceased. She indicated that her daughter is alive. She indicated that her son is alive.  Social History    Social History   Socioeconomic History   Marital status: Widowed    Spouse name: Not on file   Number of children: 2   Years of education: Not on file   Highest education level: High school graduate  Occupational History   Occupation: retired  Tobacco Use   Smoking status: Never   Smokeless tobacco: Never  Vaping Use   Vaping Use: Never used  Substance and Sexual Activity   Alcohol use: No   Drug use: No   Sexual activity: Not Currently  Other Topics Concern   Not on  file  Social History Narrative   Pt lives alone. Daughter [name] close by   Social Determinants of Health   Financial Resource Strain: Low Risk  (03/22/2022)   Overall Financial Resource Strain (CARDIA)    Difficulty of Paying Living Expenses: Not hard at all  Food Insecurity: No Food Insecurity (03/22/2022)   Hunger Vital Sign    Worried About Running Out of Food in the Last Year: Never true    Ran Out of Food in the Last Year: Never true  Transportation Needs: No Transportation Needs (03/22/2022)   PRAPARE - Hydrologist (Medical): No    Lack of Transportation (Non-Medical): No  Physical Activity: Inactive (07/21/2021)   Exercise Vital Sign    Days of Exercise per Week: 0 days    Minutes of Exercise per Session: 0 min  Stress: No Stress Concern Present (03/22/2022)   [place]    Feeling of Stress : Not at all  Social Connections: Moderately Isolated (03/22/2022)   Social Connection and Isolation Panel [NHANES]    Frequency of Communication with Friends and Family: More than three times a week    Frequency of Social Gatherings with Friends and Family: More than three times a week    Attends Religious Services: More than 4 times per year    Active Member of [employer] or Organizations: No    Attends Archivist Meetings: Never    Marital Status: Widowed  Intimate Partner Violence: Not At Risk (03/22/2022)   Humiliation, Afraid, Rape, and Kick questionnaire    Fear of Current or Ex-Partner: No    Emotionally Abused: No    Physically Abused: No    Sexually Abused: No     Review of Systems    General:  No chills, fever, night sweats or weight changes.  Endorses exertional fatigue Cardiovascular:  No chest pain, dyspnea on exertion, edema, orthopnea, palpitations, paroxysmal nocturnal dyspnea. Dermatological: No rash, lesions/masses Respiratory: No cough, dyspnea Urologic: No  hematuria, dysuria Abdominal:   No nausea, vomiting, diarrhea, bright red blood per rectum, melena, or  hematemesis Neurologic:  No visual changes, endorses generalized unchanged wkns, changes in mental status. All other systems reviewed and are otherwise negative except as noted above.   Physical Exam    VS:  BP (!) 142/72 (BP Location: Right Arm, Patient Position: Sitting, Cuff Size: Normal)   Pulse 73   Ht '5\' 2"'$  (1.575 m)   Wt 129 lb 6.4 oz (58.7 kg)   LMP  (LMP Unknown)   SpO2 98%   BMI 23.67 kg/m  , BMI Body mass index is 23.67 kg/m.     GEN: Well nourished, well developed, in no acute distress. HEENT: normal.  Glasses on Neck: Supple, no JVD, carotid bruits, or masses. Cardiac: Irregularly irregular, no murmurs, rubs, or gallops. No clubbing, cyanosis, edema.  Radials 2+/PT 2+ and equal bilaterally.  Respiratory:  Respirations regular and unlabored, clear to auscultation bilaterally. GI: Soft, nontender, nondistended, BS + x 4. MS: no deformity or atrophy. Skin: warm and dry, no rash. Neuro:  Strength and sensation are intact. Psych: Normal affect.  Accessory Clinical Findings    ECG personally reviewed by me today-sinus (73 with PACs, first-degree AV block and left bundle branch block- No acute changes  Lab Results  Component Value Date   WBC 8.5 01/31/2022   HGB 11.1 (L) 01/31/2022   HCT 32.6 (L) 01/31/2022   MCV 91.3 01/31/2022   PLT 188 01/31/2022   Lab Results  Component Value Date   CREATININE 0.99 (H) 02/02/2022   BUN 16 02/02/2022   NA 131 (L) 02/02/2022   K 5.0 02/02/2022   CL 99 02/02/2022   CO2 24 02/02/2022   Lab Results  Component Value Date   ALT 12 01/31/2022   AST 18 01/31/2022   ALKPHOS 26 (L) 01/31/2022   BILITOT 1.0 01/31/2022   Lab Results  Component Value Date   CHOL 171 11/17/2020   HDL 55 11/17/2020   LDLCALC 90 11/17/2020   TRIG 147 11/17/2020   CHOLHDL 3.1 11/17/2020    Lab Results  Component Value Date   HGBA1C 6.0 (H)  07/02/2018    Assessment & Plan   1.  Heart failure with moderately reduced ejection fraction on last echocardiogram LVEF was 40-45%.  On exam she appears euvolemic.  Denies any shortness of breath or peripheral edema.  Previously with a drop in LVEF noted in August with regional wall motion abnormality and was ruled out but discussed further ischemic evaluation but given the patient's advanced age and comorbidities no further ischemic evaluation and workup was deferred.  She is continued on lisinopril 40 mg daily and Aldactone 25 mg daily.  Unfortunately unable to escalate GDMT due to orthostatic symptoms.  At her last appointment her amlodipine was placed on hold for blood pressures less than 287 systolic.  They have continued to keep blood pressure logs at home and patient is still occasionally having orthostatic symptoms but they are improving since changes to previous medication regimen.  Will continue to work on possibly adding beta-blocker therapy at return appointment if orthostatic symptoms have resolved.  2.  Hypertension with recurrent symptoms of orthostatic lightheadedness and dizziness.  Blood pressure today is 142/72.  She has been keeping a log regularly at home and still does have the symptoms noted in her pressure.  She is continue to remain off of midodrine and her amlodipine has been changed to give with systolic blood pressure is 140 or better.  She has been continued on lisinopril 40 mg daily with a refill request  to the pharmacy of choice today.  No changes were made to her antihypertensive regimen at this time.  3.  Paroxysmal atrial fibrillation.  EKG today reveals sinus rhythm with a rate of 73 with PACs, first-degree AV block and chronic left bundle branch block with comparison to other EKGs she had previously LBBB but remains asymptomatic.  She is continued on Xarelto 15 mg daily for CHA2DS2-VASc score of at least 6.  4.  Sick sinus syndrome with ongoing follow-up with device  clinic with Dr Caryl Comes.  5.  Disposition patient to return to clinic to see MD/APP in 6 months or sooner if needed.  Tenisha Fleece, NP 07/18/2022, 10:46 AM

## 2022-07-18 ENCOUNTER — Ambulatory Visit: Payer: Medicare Other

## 2022-07-18 ENCOUNTER — Ambulatory Visit: Payer: Medicare Other | Attending: Internal Medicine | Admitting: Cardiology

## 2022-07-18 ENCOUNTER — Encounter: Payer: Self-pay | Admitting: Cardiology

## 2022-07-18 VITALS — BP 142/72 | HR 73 | Ht 62.0 in | Wt 129.4 lb

## 2022-07-18 DIAGNOSIS — R42 Dizziness and giddiness: Secondary | ICD-10-CM | POA: Diagnosis not present

## 2022-07-18 DIAGNOSIS — R262 Difficulty in walking, not elsewhere classified: Secondary | ICD-10-CM

## 2022-07-18 DIAGNOSIS — I5022 Chronic systolic (congestive) heart failure: Secondary | ICD-10-CM | POA: Insufficient documentation

## 2022-07-18 DIAGNOSIS — I447 Left bundle-branch block, unspecified: Secondary | ICD-10-CM | POA: Insufficient documentation

## 2022-07-18 DIAGNOSIS — R2681 Unsteadiness on feet: Secondary | ICD-10-CM | POA: Diagnosis not present

## 2022-07-18 DIAGNOSIS — I495 Sick sinus syndrome: Secondary | ICD-10-CM | POA: Insufficient documentation

## 2022-07-18 DIAGNOSIS — I10 Essential (primary) hypertension: Secondary | ICD-10-CM | POA: Insufficient documentation

## 2022-07-18 DIAGNOSIS — I480 Paroxysmal atrial fibrillation: Secondary | ICD-10-CM | POA: Diagnosis not present

## 2022-07-18 DIAGNOSIS — M6281 Muscle weakness (generalized): Secondary | ICD-10-CM | POA: Diagnosis not present

## 2022-07-18 MED ORDER — LISINOPRIL 40 MG PO TABS: 40.0000 mg | ORAL_TABLET | Freq: Every day | ORAL | 3 refills | Status: DC

## 2022-07-18 NOTE — Patient Instructions (Signed)
Medication Instructions:  Your Physician recommend you continue on your current medication as directed.    *If you need a refill on your cardiac medications before your next appointment, please call your pharmacy*   Lab Work: None ordered today   Testing/Procedures: None ordered today   Follow-Up: At [facility], you and your health needs are our priority.  As part of our continuing mission to provide you with exceptional heart care, we have created designated Provider Care Teams.  These Care Teams include your primary Cardiologist (physician) and Advanced Practice Providers (APPs -  Physician Assistants and Nurse Practitioners) who all work together to provide you with the care you need, when you need it.  We recommend signing up for the patient portal called "MyChart".  Sign up information is provided on this After Visit Summary.  MyChart is used to connect with patients for Virtual Visits (Telemedicine).  Patients are able to view lab/test results, encounter notes, upcoming appointments, etc.  Non-urgent messages can be sent to your provider as well.   To learn more about what you can do with MyChart, go to [email or web address].    Your next appointment:   6 month(s)  Provider:   You may see Christopher End, MD or one of the following Advanced Practice Providers on your designated Care Team:   Christopher Berge, NP Ryan Dunn, PA-C Cadence Furth, PA-C Sheri Hammock, NP     

## 2022-07-18 NOTE — Therapy (Signed)
OUTPATIENT PHYSICAL THERAPY NEURO TREATMENT NOTE  Patient Name: Sydney Mcdaniel MRN: [id number] DOB:05-03-1925, 89 y.o., female Today's Date: 07/18/2022  PCP: Teodora Medici, DO REFERRING PROVIDER: Teodora Medici, DO    PT End of Session - 07/18/22 1505     Visit Number 19    Number of Visits 25    Date for PT Re-Evaluation 09/19/22    Authorization Time Period 04/03/22-06/26/2022    Progress Note Due on Visit 20    PT Start Time 1107    PT Stop Time 1146    PT Time Calculation (min) 39 min    Equipment Utilized During Treatment Gait belt    Activity Tolerance Patient tolerated treatment well;No increased pain    Behavior During Therapy WFL for tasks assessed/performed               Past Medical History:  Diagnosis Date   Allergy    Anemia    Cardiomyopathy ([place])    a. 02/2019 Echo: EF 60-65%, no rwma, doppler parameters consistent w/ pseudonormalization. Nl RV size/fxn; b. 06/2021 Echo: EF 40-45% w/ sev basal-mid antsept HK. GrII DD. Nl RV size/fxn. Mild MR/AI.   Carotid arterial disease ([place])    a. 04/2020 Carotid U/S: 1-39% bilat ICA stenosis, <50% bilat ECA stenosis; b. 06/2021 Carotid U/S: RICA 1-69%, LICA 67-89%.   Cataract    CLL (chronic lymphocytic leukemia) (HCC)    GERD (gastroesophageal reflux disease)    Glaucoma    H/O: hysterectomy    Total   History of stress test    a. 02/2019 MV: EF 60%, no ischemia/infarct.   Hyperlipidemia    Hypertension    Hypothyroidism    Impaired fasting glucose    Lichen sclerosus    Osteoporosis    Hips   PAF (paroxysmal atrial fibrillation) ([place])    a. 06/15/2016 Event monitor: 4% afib burden; b. CHA2DS2VASc - 4-->Xarelto.   Sinus bradycardia    a. avoid AVN blocking agents.   Syncope    a. 03/2016 Echo: EF 55-60%, no rwma, mild AI/MR, nl PASP; b. 03/2016 48h Holter: no significant arrhythmias/pauses; c. 03/2016 MV: mild apical defect, likely breast attenuation, nl EF, low risk; d. 05/2016 Event monitor: No  significant arrhythmia; e. 05/2016 Event monitor: PAF (4%).   Past Surgical History:  Procedure Laterality Date   APPENDECTOMY     EYE SURGERY     Glaucoma   MULTIPLE TOOTH EXTRACTIONS N/A    2 teeth   PACEMAKER IMPLANT N/A 09/22/2021   Procedure: PACEMAKER IMPLANT;  Surgeon: Deboraha Sprang, MD;  Location: [facility];  Service: Cardiovascular;  Laterality: N/A;   TOTAL ABDOMINAL HYSTERECTOMY     Patient Active Problem List   Diagnosis Date Noted   HFrEF (heart failure with reduced ejection fraction) ([place]) 05/06/2022   Sick sinus syndrome ([place]) 05/06/2022   General weakness 02/01/2022   Arm pain, anterior, right 01/30/2022   SIADH (syndrome of inappropriate ADH production) ([place]) 07/05/2021   Aspiration pneumonia ([place]) 05/21/2021   Elevated troponin 05/20/2021   Fall 02/11/2021   Normocytic anemia 05/31/2020   Goals of care, counseling/discussion 03/08/2020   Positive direct antiglobulin test (DAT) 12/04/2019   Sinus bradycardia 04/09/2019   First degree AV block 04/09/2019   Osteoporosis 08/21/2018   Bilateral carotid artery stenosis 04/19/2018   History of syncope 10/17/2017   MCI (mild cognitive impairment) 08/12/2017   Lung nodules 07/09/2017   Orthostatic lightheadedness 04/18/2017   Paroxysmal atrial fibrillation ([place]) 04/18/2017  Hyponatremia 10/20/2016   Chronic diastolic congestive heart failure ([place]) 06/28/2016   Syncope, near 05/24/2016   Primary open-angle glaucoma, bilateral, mild stage 05/08/2016   Bladder prolapse, female, acquired 02/25/2016   Asymptomatic bacteriuria 02/25/2016   B12 deficiency 01/11/2016   Anemia 01/10/2016   CLL (chronic lymphocytic leukemia) ([place]) 01/10/2016   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 12/21/2015   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 12/21/2015   Bradycardia 12/21/2015   Lymphocytosis 06/25/2015   Impaired fasting glucose 06/22/2015   Carotid atherosclerosis 06/22/2015   Essential hypertension  12/22/2014   Hyperlipidemia 12/22/2014   Hypothyroidism 12/22/2014    ONSET DATE: 01/30/2022  REFERRING DIAG: R26.9 (ICD-10-CM) - Abnormality of gait and mobility  THERAPY DIAG:  Muscle weakness (generalized)  Unsteadiness on feet  Difficulty in walking, not elsewhere classified  Rationale for Evaluation and Treatment Rehabilitation  SUBJECTIVE:   SUBJECTIVE STATEMENT:  Pt states things have been about the same. She reports no aches/pains. Pt reports no stumbles/falls. No other changes.    [name]: family member DTR  PERTINENT HISTORY: [name] back in 2022 for weakness, imbalance. She returns for generalized weakness, balance issues and falls following hospitalization 01/30/22-01/31/22 for acute on chronic hyponatremia, near syncope, RUE pain and generalized weakness. Pt biggest concern is her balance. 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   FALLS: Has patient fallen in last 6 months? Yes. Number of falls 1  LIVING ENVIRONMENT: Lives with: lives with their family and lives alone, but daughter lives next door Lives in: House/apartment Stairs:  pt has a ramp, but one step from back porch into the house (pt grabs onto shelves on R hand side) Has following equipment at home: Single point cane, Walker - 4 wheeled, Wheelchair (manual), shower chair, bed side commode, Grab bars, and Ramped entry  PLOF: Independent  PATIENT  GOALS: "I want more balance"    OBJECTIVE: taken at eval unless otherwise specified   DIAGNOSTIC FINDINGS:   CT head WO contrast (5MM) 8/7/20203 per chart "IMPRESSION: No evidence of acute intracranial abnormality."  COGNITION: Overall cognitive status: History of cognitive impairments - at baseline per pt's daughter, mild cog impairment per chart   SENSATION: Pt daughter reports usually LLE impaired vs. RLE.  Pt able to feel light touch to BLE on exam  COORDINATION: WNL BUE and BLE   EDEMA:  Dtr reports swelling in LLE compared to RLE (reports chronic issue)   POSTURE: rounded shoulders, forward head, and increased thoracic kyphosis    LOWER EXTREMITY MMT:    Grossly 4+/5 BLE exception hip flexors 4-/5 BLE   TRANSFERS: Assistive device utilized: Single point cane  Sit to stand: Modified independence Stand to sit: Modified independence Chair to chair: Modified independence   GAIT: Gait pattern: step to pattern, decreased arm swing- Left, decreased stride length, and narrow BOS, dec B hip ext throughout gait cycle Distance walked: 10 m Assistive device utilized: Single point cane Level of assistance:  Close CGA, pt reports fear of falls/unsteadiness observed   FUNCTIONAL TESTs:  5 times sit to  stand: 28 seconds  Timed up and go (TUG): 32 sec with SPC 10 meter walk test: 0.45 m/s with SPC close CGA due to unsteadiness  Berg Balance Scale: deferred   PATIENT SURVEYS:  FOTO 38 (goal score 47)  TODAY'S TREATMENT:  Gait belt donned and CGA provided unless specified otherwise   There.ex:   5# ankle weights: LAQ 2x10 each LE Standing alt 5" step tap and 12" step tap for 20x for each LE Standing hip ext 2x10 each LE  STS 2x10 with 2000 gr ball. Rates medium  5# AW donned each LE: Seated heel raises 2x20 B. Rates medium  Gait for endurance with 5# AW on each LE, use of RW, CGA, 1x296 ft. Cuing for proximity to RW and upright posture throughout. Rates  medium  RTB rows 2x15 B RTB ER/pull-apart 1x10 B  PATIENT EDUCATION: Education details: Pt educated throughout session about proper posture and technique with exercises. Improved exercise technique, movement at target joints, use of target muscles after min to mod verbal, visual, tactile cues.   Person educated: Patient and son in law Education method: Explanation, Demonstration, Tactile cues, and Verbal cues Education comprehension: verbalized understanding, returned demonstration, verbal cues required, tactile cues required, and needs further education   HOME EXERCISE PROGRAM:   Instruction in use of theraputty  Access Code: [email or web address] URL: [email or web address] Date: 04/03/2022 Prepared by: Ricard Dillon  Exercises - Seated March  - 1 x daily - 5 x weekly - 3 sets - 20 reps - alternating leg hold    GOALS: Goals reviewed with patient? Yes  SHORT TERM GOALS: Target date: 08/29/2022  Patient will be independent in home exercise program to improve strength/mobility for better functional independence with ADLs. Baseline: initiated; 06/27/21: pt HEP to be advanced; 06/29/22: to be advanced, pt DTR reports pt feels comfortable is indep Goal status: MET    LONG TERM GOALS: Target date: 10/10/2022  Patient will increase FOTO score to equal to or greater than  47   to demonstrate statistically significant improvement in mobility and quality of life.  Baseline: 38 05/25/22: 42; 1/2: deferred; 06/29/22: 44 Goal status: PARTIALLY MET  2.  Patient (> 73 years old) will complete five times sit to stand test in < 15 seconds indicating an increased LE strength and improved balance. Baseline: 28 sec use of UE 05/25/22: 19.57 sec use of UE's on knees; 1/2: deferred; `/4/24: 23 sec hands but hands-free Goal status: IN PROGRESS  3. Patient will increase 10 meter walk test to >1.31ms as to improve gait speed for better community ambulation and to reduce fall risk.  Baseline: 0.45  m/s w SPC, close CGA 05/25/22: 0.56 m/s w SPC, close CGA; 1/2: deferred; 1/4: 0.45 m/s with SPC close CGA  Goal status: IN PROGRESS  4.   Patient will reduce timed up and go to <15 seconds to reduce fall risk and demonstrate improved transfer/gait ability. Baseline: 32 sec with SPC 05/25/22: 29.19 sec with SPC; 06/27/22: deferred; 06/29/22: 34 seconds with SPC and close CGA  Goal status: IN PROGRESS  5.   Patient will increase Berg Balance score by > 6 points to demonstrate decreased fall risk during functional activities. Baseline: to be completed next 1-2 sessions; 10/18: 32/56 05/25/22:  37/56; 06/27/22: deferred; 06/29/22: 38/56  Goal status: IN PROGRESS   ASSESSMENT:  CLINICAL IMPRESSION: Pt progresses therex with increased weights. Pt also performs more advanced weighted gait exercise, but must use RW due to unsteadiness (typically uses a SPC). Pt  rates exercises as moderately difficult. The pt will continue to benefit from skilled physical therapy intervention to address impairments, improve QOL, and attain therapy goals.      OBJECTIVE IMPAIRMENTS Abnormal gait, decreased activity tolerance, decreased balance, decreased cognition, decreased endurance, decreased knowledge of use of DME, decreased mobility, difficulty walking, decreased strength, impaired vision/preception, improper body mechanics, postural dysfunction, and pain.   ACTIVITY LIMITATIONS squatting, stairs, transfers, and locomotion level  PARTICIPATION LIMITATIONS: meal prep, cleaning, laundry, shopping, community activity, and yard work  PERSONAL FACTORS Age, Fitness, Past/current experiences, Sex, and 3+ comorbidities: Per chart PMH significant for hypertension, hyperlipidemia, chronic lymphocytic leukemia, chronic hyponatremia and SIADH, generalized weakness, hypothyroidism, carotid atherosclerosis, lymphocytosis, neoplasm of uncertain behavior of skin of ear and nose, B12 deficiency, bladder prolapse, glaucoma, Chronic  diastolic congestive heart failure, paroxysmal atria fibrillation, lung nodules, mild cognitive impairment, osteoporosis, first degree AV block, normocytic anemia, falls,   are also affecting patient's functional outcome.   REHAB POTENTIAL: Good  CLINICAL DECISION MAKING: Evolving/moderate complexity  EVALUATION COMPLEXITY: Moderate  PLAN: PT FREQUENCY: 2x/week  PT DURATION: 12 weeks  PLANNED INTERVENTIONS: Therapeutic exercises, Therapeutic activity, Neuromuscular re-education, Balance training, Gait training, Patient/Family education, Self Care, Joint mobilization, Stair training, Vestibular training, Canalith repositioning, Orthotic/Fit training, DME instructions, Wheelchair mobility training, Spinal mobilization, Cryotherapy, Moist heat, scar mobilization, Splintting, Taping, Ultrasound, Parrafin, Biofeedback, Manual therapy, and Re-evaluation  PLAN FOR NEXT SESSION:   Strength, balance, SPC gait training, continue plan   Zollie Pee PT  07/18/22, 3:08 PM

## 2022-07-20 ENCOUNTER — Ambulatory Visit: Payer: Medicare Other

## 2022-07-20 DIAGNOSIS — R262 Difficulty in walking, not elsewhere classified: Secondary | ICD-10-CM

## 2022-07-20 DIAGNOSIS — R2681 Unsteadiness on feet: Secondary | ICD-10-CM

## 2022-07-20 DIAGNOSIS — M6281 Muscle weakness (generalized): Secondary | ICD-10-CM | POA: Diagnosis not present

## 2022-07-20 NOTE — Therapy (Signed)
OUTPATIENT PHYSICAL THERAPY NEURO TREATMENT NOTE/Physical Therapy Progress Note   Dates of reporting period  05/25/2022   to   07/20/2022   Patient Name: Sydney Mcdaniel MRN: [id number] DOB:09/21/1924, 89 y.o., female Today's Date: 07/20/2022  PCP: Teodora Medici, DO REFERRING PROVIDER: Teodora Medici, DO    PT End of Session - 07/20/22 1052     Visit Number 20    Number of Visits 25    Date for PT Re-Evaluation 09/19/22    Authorization Time Period 04/03/22-06/26/2022    Progress Note Due on Visit 20    PT Start Time 1048    PT Stop Time 1130    PT Time Calculation (min) 42 min    Equipment Utilized During Treatment Gait belt    Activity Tolerance Patient tolerated treatment well;No increased pain    Behavior During Therapy WFL for tasks assessed/performed               Past Medical History:  Diagnosis Date   Allergy    Anemia    Cardiomyopathy ([place])    a. 02/2019 Echo: EF 60-65%, no rwma, doppler parameters consistent w/ pseudonormalization. Nl RV size/fxn; b. 06/2021 Echo: EF 40-45% w/ sev basal-mid antsept HK. GrII DD. Nl RV size/fxn. Mild MR/AI.   Carotid arterial disease ([place])    a. 04/2020 Carotid U/S: 1-39% bilat ICA stenosis, <50% bilat ECA stenosis; b. 06/2021 Carotid U/S: RICA 8-14%, LICA 48-18%.   Cataract    CLL (chronic lymphocytic leukemia) (HCC)    GERD (gastroesophageal reflux disease)    Glaucoma    H/O: hysterectomy    Total   History of stress test    a. 02/2019 MV: EF 60%, no ischemia/infarct.   Hyperlipidemia    Hypertension    Hypothyroidism    Impaired fasting glucose    Lichen sclerosus    Osteoporosis    Hips   PAF (paroxysmal atrial fibrillation) ([place])    a. 06/15/2016 Event monitor: 4% afib burden; b. CHA2DS2VASc - 4-->Xarelto.   Sinus bradycardia    a. avoid AVN blocking agents.   Syncope    a. 03/2016 Echo: EF 55-60%, no rwma, mild AI/MR, nl PASP; b. 03/2016 48h Holter: no significant arrhythmias/pauses; c. 03/2016 MV:  mild apical defect, likely breast attenuation, nl EF, low risk; d. 05/2016 Event monitor: No significant arrhythmia; e. 05/2016 Event monitor: PAF (4%).   Past Surgical History:  Procedure Laterality Date   APPENDECTOMY     EYE SURGERY     Glaucoma   MULTIPLE TOOTH EXTRACTIONS N/A    2 teeth   PACEMAKER IMPLANT N/A 09/22/2021   Procedure: PACEMAKER IMPLANT;  Surgeon: Deboraha Sprang, MD;  Location: [facility];  Service: Cardiovascular;  Laterality: N/A;   TOTAL ABDOMINAL HYSTERECTOMY     Patient Active Problem List   Diagnosis Date Noted   HFrEF (heart failure with reduced ejection fraction) ([place]) 05/06/2022   Sick sinus syndrome ([place]) 05/06/2022   General weakness 02/01/2022   Arm pain, anterior, right 01/30/2022   SIADH (syndrome of inappropriate ADH production) ([place]) 07/05/2021   Aspiration pneumonia ([place]) 05/21/2021   Elevated troponin 05/20/2021   Fall 02/11/2021   Normocytic anemia 05/31/2020   Goals of care, counseling/discussion 03/08/2020   Positive direct antiglobulin test (DAT) 12/04/2019   Sinus bradycardia 04/09/2019   First degree AV block 04/09/2019   Osteoporosis 08/21/2018   Bilateral carotid artery stenosis 04/19/2018   History of syncope 10/17/2017   MCI (mild cognitive impairment) 08/12/2017  Lung nodules 07/09/2017   Orthostatic lightheadedness 04/18/2017   Paroxysmal atrial fibrillation (HCC) 04/18/2017   Hyponatremia 10/20/2016   Chronic diastolic congestive heart failure ([place]) 06/28/2016   Syncope, near 05/24/2016   Primary open-angle glaucoma, bilateral, mild stage 05/08/2016   Bladder prolapse, female, acquired 02/25/2016   Asymptomatic bacteriuria 02/25/2016   B12 deficiency 01/11/2016   Anemia 01/10/2016   CLL (chronic lymphocytic leukemia) ([place]) 01/10/2016   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 12/21/2015   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 12/21/2015   Bradycardia 12/21/2015   Lymphocytosis 06/25/2015    Impaired fasting glucose 06/22/2015   Carotid atherosclerosis 06/22/2015   Essential hypertension 12/22/2014   Hyperlipidemia 12/22/2014   Hypothyroidism 12/22/2014    ONSET DATE: 01/30/2022  REFERRING DIAG: R26.9 (ICD-10-CM) - Abnormality of gait and mobility  THERAPY DIAG:  Muscle weakness (generalized)  Unsteadiness on feet  Difficulty in walking, not elsewhere classified  Rationale for Evaluation and Treatment Rehabilitation  SUBJECTIVE:   SUBJECTIVE STATEMENT:  Pt states she has been doing her HEP. She reports no aches/pains. Pt reports no stumbles/falls.     [name]: family member DTR  PERTINENT HISTORY: [name] back in 2022 for weakness, imbalance. She returns for generalized weakness, balance issues and falls following hospitalization 01/30/22-01/31/22 for acute on chronic hyponatremia, near syncope, RUE pain and generalized weakness. Pt biggest concern is her balance. 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   FALLS: Has patient fallen in last 6 months? Yes. Number of falls 1  LIVING ENVIRONMENT: Lives with: lives with their family and lives alone, but daughter lives next door Lives in: House/apartment Stairs:  pt has a ramp, but one step from back porch into the house (pt grabs onto shelves on R hand side) Has following equipment at home: Single point cane, Walker - 4 wheeled, Wheelchair (manual), shower chair, bed  side commode, Grab bars, and Ramped entry  PLOF: Independent  PATIENT GOALS: "I want more balance"    OBJECTIVE: taken at eval unless otherwise specified   DIAGNOSTIC FINDINGS:   CT head WO contrast (5MM) 8/7/20203 per chart "IMPRESSION: No evidence of acute intracranial abnormality."  COGNITION: Overall cognitive status: History of cognitive impairments - at baseline per pt's daughter, mild cog impairment per chart   SENSATION: Pt daughter reports usually LLE impaired vs. RLE.  Pt able to feel light touch to BLE on exam  COORDINATION: WNL BUE and BLE   EDEMA:  Dtr reports swelling in LLE compared to RLE (reports chronic issue)   POSTURE: rounded shoulders, forward head, and increased thoracic kyphosis    LOWER EXTREMITY MMT:    Grossly 4+/5 BLE exception hip flexors 4-/5 BLE   TRANSFERS: Assistive device utilized: Single point cane  Sit to stand: Modified independence Stand to sit: Modified independence Chair to chair: Modified independence   GAIT: Gait pattern: step to pattern, decreased arm swing- Left, decreased stride length, and narrow BOS, dec B hip ext throughout gait cycle Distance walked: 10 m Assistive device utilized: Single point cane Level of assistance:  Close CGA,  pt reports fear of falls/unsteadiness observed   FUNCTIONAL TESTs:  5 times sit to stand: 28 seconds  Timed up and go (TUG): 32 sec with SPC 10 meter walk test: 0.45 m/s with SPC close CGA due to unsteadiness  Berg Balance Scale: deferred   PATIENT SURVEYS:  FOTO 38 (goal score 47)  TODAY'S TREATMENT:  Gait belt donned and CGA provided unless specified otherwise   TherAct: Goals reassessed for progress note. Please refer to goal section below for details  NMR: Semi-tandem stance at support surface 2x30 sec each LE. Intermittent UE support.  PATIENT EDUCATION: Education details: Pt educated throughout session about proper posture and technique with exercises. Improved  exercise technique, movement at target joints, use of target muscles after min to mod verbal, visual, tactile cues. Goals, plan  Person educated: Patient and DTR Education method: Explanation, Demonstration, Tactile cues, and Verbal cues Education comprehension: verbalized understanding, returned demonstration, verbal cues required, tactile cues required, and needs further education   HOME EXERCISE PROGRAM: Pt to continue HEP as previously given  Instruction in use of theraputty  Access Code: [email or web address] URL: [email or web address] Date: 04/03/2022 Prepared by: Ricard Dillon  Exercises - Seated March  - 1 x daily - 5 x weekly - 3 sets - 20 reps - alternating leg hold    GOALS: Goals reviewed with patient? Yes  SHORT TERM GOALS: Target date: 08/31/2022  Patient will be independent in home exercise program to improve strength/mobility for better functional independence with ADLs. Baseline: initiated; 06/27/21: pt HEP to be advanced; 06/29/22: to be advanced, pt DTR reports pt feels comfortable is indep Goal status: MET    LONG TERM GOALS: Target date: 10/12/2022  Patient will increase FOTO score to equal to or greater than  47   to demonstrate statistically significant improvement in mobility and quality of life.  Baseline: 38 05/25/22: 42; 1/2: deferred; 06/29/22: 44; 07/20/22: 39 Goal status: PARTIALLY MET  2.  Patient (> 87 years old) will complete five times sit to stand test in < 15 seconds indicating an increased LE strength and improved balance. Baseline: 28 sec use of UE 05/25/22: 19.57 sec use of UE's on knees; 1/2: deferred; `/4/24: 23 sec hands but hands-free; 1/25: 19 sec hands-free Goal status: IN PROGRESS  3. Patient will increase 10 meter walk test to >1.16ms as to improve gait speed for better community ambulation and to reduce fall risk.  Baseline: 0.45 m/s w SPC, close CGA 05/25/22: 0.56 m/s w SPC, close CGA; 1/2: deferred; 1/4: 0.45 m/s with SPC close CGA  ; 1/25: 0.63 m/s with SPC Goal status: IN PROGRESS  4.   Patient will reduce timed up and go to <15 seconds to reduce fall risk and demonstrate improved transfer/gait ability. [facility]: 29.19 sec with SPC; 06/27/22: deferred; 06/29/22: 34 seconds with SPC and close CGA; 1/25: 26 sec with SPC, close CGA Goal status: IN PROGRESS  5.   Patient will increase Berg Balance score by > 6 points to demonstrate decreased fall risk during functional activities. Baseline: to be completed next 1-2 sessions; 10/18: 32/56 05/25/22:  37/56; 06/27/22: deferred; 06/29/22: 38/56 ; 1/25: 38/40 Goal status: IN PROGRESS   ASSESSMENT:  CLINICAL IMPRESSION: Goals reassessed for progress note. Pt making gains on majority of therapy goals, indicating improved LE strength, gait speed, balance and decreased fall risk AEB 5xSTS, 10MWT, TUG and BERG performances. Pt only with decrease on FOTO, indicating perception of decreased functional mobility. The pt will continue  to benefit from skilled physical therapy intervention to address impairments, improve QOL, and attain therapy goals.      OBJECTIVE IMPAIRMENTS Abnormal gait, decreased activity tolerance, decreased balance, decreased cognition, decreased endurance, decreased knowledge of use of DME, decreased mobility, difficulty walking, decreased strength, impaired vision/preception, improper body mechanics, postural dysfunction, and pain.   ACTIVITY LIMITATIONS squatting, stairs, transfers, and locomotion level  PARTICIPATION LIMITATIONS: meal prep, cleaning, laundry, shopping, community activity, and yard work  PERSONAL FACTORS Age, Fitness, Past/current experiences, Sex, and 3+ comorbidities: Per chart PMH significant for hypertension, hyperlipidemia, chronic lymphocytic leukemia, chronic hyponatremia and SIADH, generalized weakness, hypothyroidism, carotid atherosclerosis, lymphocytosis, neoplasm of uncertain behavior of skin of ear and nose,  B12 deficiency, bladder prolapse, glaucoma, Chronic diastolic congestive heart failure, paroxysmal atria fibrillation, lung nodules, mild cognitive impairment, osteoporosis, first degree AV block, normocytic anemia, falls,   are also affecting patient's functional outcome.   REHAB POTENTIAL: Good  CLINICAL DECISION MAKING: Evolving/moderate complexity  EVALUATION COMPLEXITY: Moderate  PLAN: PT FREQUENCY: 2x/week  PT DURATION: 12 weeks  PLANNED INTERVENTIONS: Therapeutic exercises, Therapeutic activity, Neuromuscular re-education, Balance training, Gait training, Patient/Family education, Self Care, Joint mobilization, Stair training, Vestibular training, Canalith repositioning, Orthotic/Fit training, DME instructions, Wheelchair mobility training, Spinal mobilization, Cryotherapy, Moist heat, scar mobilization, Splintting, Taping, Ultrasound, Parrafin, Biofeedback, Manual therapy, and Re-evaluation  PLAN FOR NEXT SESSION:   Strength, balance, SPC gait training, continue plan   Zollie Pee PT  07/20/22, 1:53 PM

## 2022-07-21 ENCOUNTER — Other Ambulatory Visit: Payer: Self-pay | Admitting: Internal Medicine

## 2022-07-21 ENCOUNTER — Other Ambulatory Visit: Payer: Self-pay

## 2022-07-21 DIAGNOSIS — E782 Mixed hyperlipidemia: Secondary | ICD-10-CM

## 2022-07-21 DIAGNOSIS — I10 Essential (primary) hypertension: Secondary | ICD-10-CM

## 2022-07-21 DIAGNOSIS — E039 Hypothyroidism, unspecified: Secondary | ICD-10-CM

## 2022-07-21 NOTE — Telephone Encounter (Signed)
Requested medications are due for refill today.  yes  Requested medications are on the active medications list.  yes  Last refill. 12/29/2021 #90 1 rf  Future visit scheduled.   yes  Notes to clinic.  Labs are expired.    Requested Prescriptions  Pending Prescriptions Disp Refills   atorvastatin (LIPITOR) 20 MG tablet [Pharmacy Med Name: Atorvastatin Calcium 20 MG Oral Tablet] 90 tablet 0    Sig: TAKE 1 TABLET BY MOUTH ONCE DAILY IN THE EVENING     Cardiovascular:  Antilipid - Statins Failed - 07/21/2022  9:24 AM      Failed - Lipid Panel in normal range within the last 12 months    Cholesterol, Total  Date Value Ref Range Status  11/17/2020 171 100 - 199 mg/dL Final   LDL Cholesterol (Calc)  Date Value Ref Range Status  07/02/2018 90 mg/dL (calc) Final    Comment:    Reference range: <100 . Desirable range <100 mg/dL for primary prevention;   <70 mg/dL for patients with CHD or diabetic patients  with > or = 2 CHD risk factors. Marland Kitchen LDL-C is now calculated using the Martin-Hopkins  calculation, which is a validated novel method providing  better accuracy than the Friedewald equation in the  estimation of LDL-C.  Cresenciano Genre et al. Annamaria Helling. 8127;517(00): 2061-2068  (http://education.[facility].com/faq/FAQ164)    LDL Chol Calc (NIH)  Date Value Ref Range Status  11/17/2020 90 0 - 99 mg/dL Final   HDL  Date Value Ref Range Status  11/17/2020 55 >39 mg/dL Final   Triglycerides  Date Value Ref Range Status  11/17/2020 147 0 - 149 mg/dL Final         Passed - Patient is not pregnant      Passed - Valid encounter within last 12 months    Recent Outpatient Visits           2 months ago Hypothyroidism, unspecified type   [facility] Teodora Medici, DO   5 months ago Hyponatremia   [facility] Teodora Medici, DO   8 months ago Ambulatory dysfunction   [facility] Teodora Medici, DO   1 year ago Aspiration pneumonia of right lower lobe, unspecified aspiration pneumonia type [facility])   [facility] Kathrine Haddock, NP   1 year ago Hyponatremia   [place], NP       Future Appointments             In 4 days  [facility], [place]   In 3 months Teodora Medici, [facility], [facility]

## 2022-07-25 ENCOUNTER — Telehealth (INDEPENDENT_AMBULATORY_CARE_PROVIDER_SITE_OTHER): Payer: Medicare Other | Admitting: Physician Assistant

## 2022-07-25 ENCOUNTER — Ambulatory Visit: Payer: Medicare Other

## 2022-07-25 DIAGNOSIS — R2681 Unsteadiness on feet: Secondary | ICD-10-CM

## 2022-07-25 DIAGNOSIS — M810 Age-related osteoporosis without current pathological fracture: Secondary | ICD-10-CM

## 2022-07-25 DIAGNOSIS — R262 Difficulty in walking, not elsewhere classified: Secondary | ICD-10-CM

## 2022-07-25 DIAGNOSIS — Z0000 Encounter for general adult medical examination without abnormal findings: Secondary | ICD-10-CM

## 2022-07-25 DIAGNOSIS — Z13820 Encounter for screening for osteoporosis: Secondary | ICD-10-CM

## 2022-07-25 DIAGNOSIS — M6281 Muscle weakness (generalized): Secondary | ICD-10-CM | POA: Diagnosis not present

## 2022-07-25 NOTE — Therapy (Signed)
OUTPATIENT PHYSICAL THERAPY NEURO TREATMENT NOTE   Patient Name: Sydney Mcdaniel MRN: [id number] DOB:May 27, 1925, 89 y.o., female Today's Date: 07/25/2022  PCP: Teodora Medici, DO REFERRING PROVIDER: Teodora Medici, DO    PT End of Session - 07/25/22 0844     Visit Number 21    Number of Visits 25    Date for PT Re-Evaluation 09/19/22    Authorization Time Period 04/03/22-06/26/2022    Progress Note Due on Visit 20    PT Start Time 0847    PT Stop Time 0929    PT Time Calculation (min) 42 min    Equipment Utilized During Treatment Gait belt    Activity Tolerance Patient tolerated treatment well;No increased pain    Behavior During Therapy WFL for tasks assessed/performed               Past Medical History:  Diagnosis Date   Allergy    Anemia    Cardiomyopathy ([place])    a. 02/2019 Echo: EF 60-65%, no rwma, doppler parameters consistent w/ pseudonormalization. Nl RV size/fxn; b. 06/2021 Echo: EF 40-45% w/ sev basal-mid antsept HK. GrII DD. Nl RV size/fxn. Mild MR/AI.   Carotid arterial disease ([place])    a. 04/2020 Carotid U/S: 1-39% bilat ICA stenosis, <50% bilat ECA stenosis; b. 06/2021 Carotid U/S: RICA 0-63%, LICA 01-60%.   Cataract    CLL (chronic lymphocytic leukemia) (HCC)    GERD (gastroesophageal reflux disease)    Glaucoma    H/O: hysterectomy    Total   History of stress test    a. 02/2019 MV: EF 60%, no ischemia/infarct.   Hyperlipidemia    Hypertension    Hypothyroidism    Impaired fasting glucose    Lichen sclerosus    Osteoporosis    Hips   PAF (paroxysmal atrial fibrillation) ([place])    a. 06/15/2016 Event monitor: 4% afib burden; b. CHA2DS2VASc - 4-->Xarelto.   Sinus bradycardia    a. avoid AVN blocking agents.   Syncope    a. 03/2016 Echo: EF 55-60%, no rwma, mild AI/MR, nl PASP; b. 03/2016 48h Holter: no significant arrhythmias/pauses; c. 03/2016 MV: mild apical defect, likely breast attenuation, nl EF, low risk; d. 05/2016 Event monitor: No  significant arrhythmia; e. 05/2016 Event monitor: PAF (4%).   Past Surgical History:  Procedure Laterality Date   APPENDECTOMY     EYE SURGERY     Glaucoma   MULTIPLE TOOTH EXTRACTIONS N/A    2 teeth   PACEMAKER IMPLANT N/A 09/22/2021   Procedure: PACEMAKER IMPLANT;  Surgeon: Deboraha Sprang, MD;  Location: [facility];  Service: Cardiovascular;  Laterality: N/A;   TOTAL ABDOMINAL HYSTERECTOMY     Patient Active Problem List   Diagnosis Date Noted   HFrEF (heart failure with reduced ejection fraction) ([place]) 05/06/2022   Sick sinus syndrome ([place]) 05/06/2022   General weakness 02/01/2022   Arm pain, anterior, right 01/30/2022   SIADH (syndrome of inappropriate ADH production) ([place]) 07/05/2021   Aspiration pneumonia ([place]) 05/21/2021   Elevated troponin 05/20/2021   Fall 02/11/2021   Normocytic anemia 05/31/2020   Goals of care, counseling/discussion 03/08/2020   Positive direct antiglobulin test (DAT) 12/04/2019   Sinus bradycardia 04/09/2019   First degree AV block 04/09/2019   Osteoporosis 08/21/2018   Bilateral carotid artery stenosis 04/19/2018   History of syncope 10/17/2017   MCI (mild cognitive impairment) 08/12/2017   Lung nodules 07/09/2017   Orthostatic lightheadedness 04/18/2017   Paroxysmal atrial fibrillation ([place]) 04/18/2017  Hyponatremia 10/20/2016   Chronic diastolic congestive heart failure ([place]) 06/28/2016   Syncope, near 05/24/2016   Primary open-angle glaucoma, bilateral, mild stage 05/08/2016   Bladder prolapse, female, acquired 02/25/2016   Asymptomatic bacteriuria 02/25/2016   B12 deficiency 01/11/2016   Anemia 01/10/2016   CLL (chronic lymphocytic leukemia) ([place]) 01/10/2016   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 12/21/2015   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 She reports no aches/pains. Pt reports no stumbles/falls. Pt daughter reports BP going to be OK. Pt DTR says pt having some issue with her eyes due to glaucoma, cannot fully see out of R eye.     [name]: family member DTR  PERTINENT HISTORY: [name] back in 2022 for weakness, imbalance. She returns for generalized weakness, balance issues and falls following hospitalization 01/30/22-01/31/22 for acute on chronic hyponatremia, near syncope, RUE pain and generalized weakness. Pt biggest concern is her balance. 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   FALLS: Has patient fallen in last 6 months? Yes. Number of falls 1  LIVING ENVIRONMENT: Lives with: lives with their family and lives alone, but daughter lives next door Lives in: House/apartment Stairs:  pt has a ramp, but one step from back porch into the house (pt grabs onto shelves on R hand side) Has following equipment at home: Single point cane, Walker - 4 wheeled, Wheelchair  (manual), shower chair, bed side commode, Grab bars, and Ramped entry  PLOF: Independent  PATIENT GOALS: "I want more balance"    OBJECTIVE: taken at eval unless otherwise specified   DIAGNOSTIC FINDINGS:   CT head WO contrast (5MM) 8/7/20203 per chart "IMPRESSION: No evidence of acute intracranial abnormality."  COGNITION: Overall cognitive status: History of cognitive impairments - at baseline per pt's daughter, mild cog impairment per chart   SENSATION: Pt daughter reports usually LLE impaired vs. RLE.  Pt able to feel light touch to BLE on exam  COORDINATION: WNL BUE and BLE   EDEMA:  Dtr reports swelling in LLE compared to RLE (reports chronic issue)   POSTURE: rounded shoulders, forward head, and increased thoracic kyphosis    LOWER EXTREMITY MMT:    Grossly 4+/5 BLE exception hip flexors 4-/5 BLE   TRANSFERS: Assistive device utilized: Single point cane  Sit to stand: Modified independence Stand to sit: Modified independence Chair to chair: Modified independence   GAIT: Gait pattern: step to pattern, decreased arm swing- Left, decreased stride length, and narrow BOS, dec B hip ext throughout gait cycle Distance walked: 10 m Assistive device utilized:  Single point cane Level of assistance:  Close CGA, pt reports fear of falls/unsteadiness observed   FUNCTIONAL TESTs:  5 times sit to stand: 28 seconds  Timed up and go (TUG): 32 sec with SPC 10 meter walk test: 0.45 m/s with SPC close CGA due to unsteadiness  Berg Balance Scale: deferred   PATIENT SURVEYS:  FOTO 38 (goal score 47)  TODAY'S TREATMENT:  Gait belt donned and CGA provided unless specified otherwise  TherEx:  STS 2x10 with 3000 gr ball. Cuing for LE positioning/anterior weight-shift  NMR: At support surface- Semi-tandem stance 2x30 sec each LE. Intermittent UE support.  One foot on floor, one on 6" step 2x30 sec each LE position NBOS 2x30 sec NBOS with horizontal and vertical  head turns 10 turns for each in each LE position UUE support throughout   In // bars: FWD/BCKWD gait x multiple reps length of bars LTL stepping x multiple reps length of bars Comments: intermittent UE support throughout and cuing for increased step-length  Agility ladder step-length intervention performed FWD and LTL stepping with BUE support. With cuing and demo pt improved step-length with reps   PATIENT EDUCATION: Education details: Pt educated throughout session about proper posture and technique with exercises. Improved exercise technique, movement at target joints, use of target muscles after min to mod verbal, visual, tactile cues. Goals, plan  Person educated: Patient and DTR Education method: Explanation, Demonstration, Tactile cues, and Verbal cues Education comprehension: verbalized understanding, returned demonstration, verbal cues required, tactile cues required, and needs further education   HOME EXERCISE PROGRAM: Pt to continue HEP as previously given  Instruction in use of theraputty  Access Code: [email or web address] URL: [email or web address] Date: 04/03/2022 Prepared by: Ricard Dillon  Exercises - Seated March  - 1 x daily - 5 x weekly - 3 sets - 20 reps - alternating leg hold    GOALS: Goals reviewed with patient? Yes  SHORT TERM GOALS: Target date: 09/05/2022  Patient will be independent in home exercise program to improve strength/mobility for better functional independence with ADLs. Baseline: initiated; 06/27/21: pt HEP to be advanced; 06/29/22: to be advanced, pt DTR reports pt feels comfortable is indep Goal status: MET    LONG TERM GOALS: Target date: 10/17/2022  Patient will increase FOTO score to equal to or greater than  47   to demonstrate statistically significant improvement in mobility and quality of life.  Baseline: 38 05/25/22: 42; 1/2: deferred; 06/29/22: 44; 07/20/22: 39 Goal status: PARTIALLY MET  2.  Patient (> 34 years old) will  complete five times sit to stand test in < 15 seconds indicating an increased LE strength and improved balance. Baseline: 28 sec use of UE 05/25/22: 19.57 sec use of UE's on knees; 1/2: deferred; `/4/24: 23 sec hands but hands-free; 1/25: 19 sec hands-free Goal status: IN PROGRESS  3. Patient will increase 10 meter walk test to >1.43ms as to improve gait speed for better community ambulation and to reduce fall risk.  Baseline: 0.45 m/s w SPC, close CGA 05/25/22: 0.56 m/s w SPC, close CGA; 1/2: deferred; 1/4: 0.45 m/s with SPC close CGA ; 1/25: 0.63 m/s with SPC Goal status: IN PROGRESS  4.   Patient will reduce timed up and go to <15 seconds to reduce fall risk and demonstrate improved transfer/gait ability. [facility]: 29.19 sec with SPC; 06/27/22: deferred; 06/29/22: 34 seconds with SPC and close CGA; 1/25: 26 sec with SPC, close CGA Goal status: IN PROGRESS  5.  Patient will increase Berg Balance score by > 6 points to demonstrate decreased fall risk during functional activities. Baseline: to be completed next 1-2 sessions; 10/18: 32/56 05/25/22:  37/56; 06/27/22: deferred; 06/29/22: 38/56 ; 1/25: 38/40 Goal status: IN PROGRESS   ASSESSMENT:  CLINICAL IMPRESSION: Focus on balance interventions following goal reassessment last session. Pt requires intermittent UE support with dynamic balance activities and UUE support with majority of static balance interventions. While pt had difficulty with balance activities, she was able to advance LE strength training (STS) intervention. The pt will continue to benefit from skilled physical therapy intervention to address impairments, improve QOL, and attain therapy goals.      OBJECTIVE IMPAIRMENTS Abnormal gait, decreased activity tolerance, decreased balance, decreased cognition, decreased endurance, decreased knowledge of use of DME, decreased mobility, difficulty walking, decreased strength, impaired vision/preception,  improper body mechanics, postural dysfunction, and pain.   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   REHAB POTENTIAL: Good  CLINICAL DECISION MAKING: Evolving/moderate complexity  EVALUATION COMPLEXITY: Moderate  PLAN: PT FREQUENCY: 2x/week  PT DURATION: 12 weeks  PLANNED INTERVENTIONS: Therapeutic exercises, Therapeutic activity, Neuromuscular re-education, Balance training, Gait training, Patient/Family education, Self Care, Joint mobilization, Stair training, Vestibular training, Canalith repositioning, Orthotic/Fit training, DME instructions, Wheelchair mobility training, Spinal mobilization, Cryotherapy, Moist heat, scar mobilization, Splintting, Taping, Ultrasound, Parrafin, Biofeedback, Manual therapy, and Re-evaluation  PLAN FOR NEXT SESSION:   Strength, balance, SPC gait training, continue plan   Zollie Pee PT  07/25/22, 2:43 PM

## 2022-07-25 NOTE — Progress Notes (Signed)
Annual Wellness Visit  Virtual Visit via Video Note  I connected with Sydney Mcdaniel on 07/25/22 at 10:40 AM EST by a video enabled telemedicine application and verified that I am speaking with the correct person using two identifiers.  Location: Patient: at home  Provider: [facility], [place], [place]  Also on call is the patient's legal guardian, [name]   I discussed the limitations of evaluation and management by telemedicine and the availability of in person appointments. The patient expressed understanding and agreed to proceed.   Subjective:   Sydney Mcdaniel is a 89 y.o. female who presents for Medicare Annual (Subsequent) preventive examination.   Today's Provider: Talitha Givens, MHS, PA-C Introduced myself to the patient as a PA-C and provided education on APPs in clinical practice.   Review of Systems:   Cardiac Risk Factors include: advanced age (>28mn, >>11women)     Objective:     Vitals: LMP  (LMP Unknown)   There is no height or weight on file to calculate BMI.     06/04/2022    3:39 PM 01/31/2022    4:28 PM 01/31/2022    4:27 PM 01/31/2022    4:26 PM 01/30/2022    1:24 PM 11/16/2021    2:01 PM 09/22/2021    6:27 AM  Advanced Directives  Does Patient Have a Medical Advance Directive? Yes Yes   Yes Yes Yes  Type of AParamedicof AHill[place]Living will [place] of facility DNR (pink MOST or yellow form) Healthcare Power of AHarley-Davidsonof facility DNR (pink MOST or yellow form) [place] of facility DNR (pink MOST or yellow form) HTuronLiving will HLa [place] Does patient want to make changes to medical advance directive?  No - Patient declined     No - Patient declined  Copy of HSt. Hilairein Chart?   Yes - validated most recent copy scanned in chart (See row information)        Tobacco Social History   Tobacco Use   Smoking Status Never  Smokeless Tobacco Never     Counseling given: Not Answered   Clinical Intake:  Pre-visit preparation completed: Yes  Pain : No/denies pain     Nutritional Status: BMI of 19-24  Normal Nutritional Risks: None Diabetes: No  How often do you need to have someone help you when you read instructions, pamphlets, or other written materials from your doctor or pharmacy?: 5 - Always What is the last grade level you completed in school?: 12th grade  Interpreter Needed?: No     Past Medical History:  Diagnosis Date   Allergy    Anemia    Cardiomyopathy ([place]    a. 02/2019 Echo: EF 60-65%, no rwma, doppler parameters consistent w/ pseudonormalization. Nl RV size/fxn; b. 06/2021 Echo: EF 40-45% w/ sev basal-mid antsept HK. GrII DD. Nl RV size/fxn. Mild MR/AI.   Carotid arterial disease ([place]    a. 04/2020 Carotid U/S: 1-39% bilat ICA stenosis, <50% bilat ECA stenosis; b. 06/2021 Carotid U/S: RICA 10-86% LICA 476-19%   Cataract    CLL (chronic lymphocytic leukemia) (HCC)    GERD (gastroesophageal reflux disease)    Glaucoma    H/O: hysterectomy    Total   History of stress test    a. 02/2019 MV: EF 60%, no ischemia/infarct.   Hyperlipidemia    Hypertension  Hypothyroidism    Impaired fasting glucose    Lichen sclerosus    Osteoporosis    Hips   PAF (paroxysmal atrial fibrillation) ([place])    a. 06/15/2016 Event monitor: 4% afib burden; b. CHA2DS2VASc - 4-->Xarelto.   Sinus bradycardia    a. avoid AVN blocking agents.   Syncope    a. 03/2016 Echo: EF 55-60%, no rwma, mild AI/MR, nl PASP; b. 03/2016 48h Holter: no significant arrhythmias/pauses; c. 03/2016 MV: mild apical defect, likely breast attenuation, nl EF, low risk; d. 05/2016 Event monitor: No significant arrhythmia; e. 05/2016 Event monitor: PAF (4%).   Past Surgical History:  Procedure Laterality Date   APPENDECTOMY     EYE SURGERY     Glaucoma   MULTIPLE TOOTH EXTRACTIONS N/A    2 teeth    PACEMAKER IMPLANT N/A 09/22/2021   Procedure: PACEMAKER IMPLANT;  Surgeon: Deboraha Sprang, MD;  Location: [facility];  Service: Cardiovascular;  Laterality: N/A;   TOTAL ABDOMINAL HYSTERECTOMY     Family History  Problem Relation Age of Onset   Heart attack Mother    Glaucoma Mother    Heart attack Father    Parkinson's disease Brother    Heart attack Brother    Social History   Socioeconomic History   Marital status: Widowed    Spouse name: Not on file   Number of children: 2   Years of education: Not on file   Highest education level: High school graduate  Occupational History   Occupation: retired  Tobacco Use   Smoking status: Never   Smokeless tobacco: Never  Vaping Use   Vaping Use: Never used  Substance and Sexual Activity   Alcohol use: No   Drug use: No   Sexual activity: Not Currently  Other Topics Concern   Not on file  Social History Narrative   Pt lives alone. Daughter [name] close by   Social Determinants of Health   Financial Resource Strain: Low Risk  (03/22/2022)   Overall Financial Resource Strain (CARDIA)    Difficulty of Paying Living Expenses: Not hard at all  Food Insecurity: No Food Insecurity (03/22/2022)   Hunger Vital Sign    Worried About Running Out of Food in the Last Year: Never true    Ran Out of Food in the Last Year: Never true  Transportation Needs: No Transportation Needs (03/22/2022)   PRAPARE - Hydrologist (Medical): No    Lack of Transportation (Non-Medical): No  Physical Activity: Inactive (07/21/2021)   Exercise Vital Sign    Days of Exercise per Week: 0 days    Minutes of Exercise per Session: 0 min  Stress: No Stress Concern Present (03/22/2022)   [place]    Feeling of Stress : Not at all  Social Connections: Moderately Isolated (03/22/2022)   Social Connection and Isolation Panel [NHANES]    Frequency of Communication  with Friends and Family: More than three times a week    Frequency of Social Gatherings with Friends and Family: More than three times a week    Attends Religious Services: More than 4 times per year    Active Member of [employer] or Organizations: No    Attends Archivist Meetings: Never    Marital Status: Widowed    Outpatient Encounter Medications as of 07/25/2022  Medication Sig   acetaminophen (TYLENOL) 500 MG tablet Take 500 mg by mouth every 8 (eight)  hours as needed for moderate pain.   alendronate (FOSAMAX) 70 MG tablet TAKE 1 TABLET BY MOUTH EVERY FRIDAY ON AN EMPTY STOMACH WITH A FULL GLASS OF WATER   amLODipine (NORVASC) 5 MG tablet Take 0.5 tablets (2.5 mg total) by mouth daily. Hold for a blood pressure less than 093 systolic (top number)   atorvastatin (LIPITOR) 20 MG tablet TAKE 1 TABLET BY MOUTH ONCE DAILY IN THE EVENING   Cholecalciferol 25 MCG (1000 UT) tablet Take by mouth.   ferrous sulfate 325 (65 FE) MG tablet Take 2 tablets (650 mg total) by mouth daily with breakfast.   LATANOPROST OP Place 1 drop into the right eye at bedtime.   levothyroxine (EUTHYROX) 75 MCG tablet TAKE ONE TABLET BY MOUTH ON MONDAYS, WEDNESDAYS, FRIDAYS, AND SUNDAYS   levothyroxine (SYNTHROID) 88 MCG tablet TAKE 1 TABLET BY MOUTH ON TUESDAYS, THURSDAYS AND SATURDAYS   lisinopril (ZESTRIL) 40 MG tablet Take 1 tablet (40 mg total) by mouth daily.   Loteprednol Etabonate (LOTEMAX OP) Apply 1 drop to eye at bedtime. To right eye   ondansetron (ZOFRAN) 8 MG tablet Take 8 mg by mouth as needed.   polyethylene glycol (MIRALAX / GLYCOLAX) 17 g packet Take 17 g by mouth daily.   Rivaroxaban (XARELTO) 15 MG TABS tablet TAKE 1 TABLET EVERY DAY WITH SUPPER   sodium chloride (MURO 128) 5 % ophthalmic solution Place 1 drop into the right eye 2 (two) times daily.   sodium chloride 1 g tablet Take 1 g by mouth. Takes 1/2 tablet with breakfast and lunch and 1 whole tablet with supper   spironolactone  (ALDACTONE) 25 MG tablet Take 1 tablet by mouth once daily   TIMOLOL MALEATE PF OP Place 1 drop into the right eye in the morning and at bedtime.   vitamin B-12 (CYANOCOBALAMIN) 100 MCG tablet Take 100 mcg by mouth daily.   Netarsudil Dimesylate (RHOPRESSA) 0.02 % SOLN Place 1 drop into the right eye at bedtime. (Patient not taking: Reported on 07/25/2022)   No facility-administered encounter medications on file as of 07/25/2022.    Activities of Daily Living    07/25/2022   10:55 AM 07/25/2022   10:17 AM  In your present state of health, do you have any difficulty performing the following activities:  Hearing?  0  Vision?  1  Difficulty concentrating or making decisions?  0  Walking or climbing stairs?  1  Dressing or bathing?  1  Doing errands, shopping?  1  Preparing Food and eating ? Y   Comment She reports her daughter cooks for her but she can still feed herself   Using the Toilet? N   In the past six months, have you accidently leaked urine? Y   Do you have problems with loss of bowel control? N   Managing your Medications? Y   Managing your Finances? Y   Housekeeping or managing your Housekeeping? Y     Patient Care Team: Teodora Medici, DO as PCP - General (Internal Medicine) End, Harrell Gave, MD as PCP - Cardiology (Cardiology) Anell Barr, [place] (Optometry) End, Harrell Gave, MD as Consulting Physician (Cardiology) Anthonette Legato, MD (Nephrology) Sindy Guadeloupe, MD as Consulting Physician (Oncology) Vanita Ingles, RN as Registered Nurse (General Practice) Vanita Ingles, RN as Case Manager ([place]) Valente David, RN as [place] Management    Assessment:   This is a routine wellness examination for [place].  Exercise Activities and Dietary recommendations Type of  exercise: walking (She is going to PT twice per week), Time (Minutes): 10 (Walks for about 10 minutes, 2x per day), Frequency (Times/Week): 7, Weekly Exercise  (Minutes/Week): 70, Intensity: Mild, Exercise limited by: psychological condition(s)   Goals Addressed   None     Fall Risk:    07/25/2022   10:17 AM 05/09/2022   10:11 AM 02/02/2022   11:41 AM 11/02/2021    8:38 AM 07/21/2021   10:48 AM  Fall Risk   Falls in the past year? '1 1 1 1 1  '$ Number falls in past yr: 0 1 0 1 0  Injury with Fall? 0 0 0 0 0  Risk for fall due to : Impaired balance/gait Impaired balance/gait;Impaired mobility Impaired mobility;Impaired balance/gait Impaired balance/gait;Impaired mobility Impaired balance/gait;History of fall(s)  Follow up Falls prevention discussed;Education provided;Falls evaluation completed    Falls prevention discussed    FALL RISK PREVENTION PERTAINING TO THE HOME:  Any stairs in or around the home? Yes  If so, are there any without handrails? Yes   Home free of loose throw rugs in walkways, pet beds, electrical cords, etc? Yes Adequate lighting in your home to reduce risk of falls? Yes   ASSISTIVE DEVICES UTILIZED TO PREVENT FALLS:  Life alert? Yes  Use of a cane, walker or w/c? Yes  Grab bars in the bathroom? Yes  Shower chair or bench in shower? Yes  Elevated toilet seat or a handicapped toilet? No   DME ORDERS:  DME order needed?  No   TIMED UP AND GO:  Was the test performed?  No video visit .  Length of time to ambulate 10 feet: 0 sec.      Depression Screen    07/25/2022   10:18 AM 05/09/2022   10:14 AM 02/02/2022   11:41 AM 11/02/2021    8:40 AM  PHQ 2/9 Scores  PHQ - 2 Score 0 0 0 0  PHQ- 9 Score 0 0 0 0     Cognitive Function        07/25/2022   11:01 AM 07/20/2020   10:59 AM 07/08/2019    8:57 AM 07/02/2018   10:13 AM 06/27/2017   11:24 AM  6CIT Screen  What Year? 0 points 0 points 0 points 0 points 0 points  What month? 0 points 0 points 0 points 0 points 0 points  What time? 0 points 0 points 0 points 0 points 0 points  Count back from 20 0 points 0 points 0 points 0 points 2 points  Months in  reverse 4 points 0 points 0 points 0 points 0 points  Repeat phrase 6 points 4 points 4 points 2 points 6 points  Total Score 10 points 4 points 4 points 2 points 8 points    Immunization History  Administered Date(s) Administered   Fluad Quad(high Dose 65+) 03/17/2021, 03/27/2022   Influenza, High Dose Seasonal PF 03/22/2016, 03/24/2017, 04/01/2018, 03/18/2019, 03/11/2020   Influenza,inj,Quad PF,6+ Mos 04/13/2015   Influenza-Unspecified 04/11/2012, 03/10/2014, 03/24/2017   PFIZER(Purple Top)SARS-COV-2 Vaccination 07/25/2019, 08/15/2019, 03/29/2020, 05/31/2021, 05/16/2022   Pfizer Covid-19 Vaccine Bivalent Booster 17yr & up 05/16/2022   Pneumococcal Conjugate-13 12/10/2013   Pneumococcal Polysaccharide-23 04/13/2008   RSV,unspecified 06/21/2022   Td 01/22/2004   Tdap 06/27/2017   Zoster, Live 04/10/2006    Qualifies for Shingles Vaccine? Yes  Due for Shingrix. Education has been provided regarding the importance of this vaccine. Pt has been advised to call insurance company to determine out of pocket expense.  Advised may also receive vaccine at local pharmacy or Health Dept. Verbalized acceptance and understanding.  Tdap: Up to date  Flu Vaccine: Up to date  Pneumococcal Vaccine: Up to date  Covid-19 Vaccine: Completed vaccines  Screening Tests Health Maintenance  Topic Date Due   COVID-19 Vaccine (6 - 2023-24 season) 07/11/2022   Medicare Annual Wellness (AWV)  07/21/2022   Zoster Vaccines- Shingrix (1 of 2) 08/09/2022 (Originally 06/14/1944)   DTaP/Tdap/Td (3 - Td or Tdap) 06/28/2027   Pneumonia Vaccine 7+ Years old  Completed   INFLUENZA VACCINE  Completed   DEXA SCAN  Completed   HPV VACCINES  Aged Out    Cancer Screenings:  Colorectal Screening:No longer required.   Mammogram: No longer required.   Bone Density: No longer required.   Lung Cancer Screening: (Low Dose CT Chest recommended if Age 31-80 years, 30 pack-year currently smoking OR have quit w/in  15years.) does not qualify.    Additional Screening:  Hepatitis C Screening: does not qualify  Vision Screening: Recommended annual ophthalmology exams for early detection of glaucoma and other disorders of the eye. Is the patient up to date with their annual eye exam?  Yes  Who is the provider or what is the name of the office in which the pt attends annual eye exams? [facility]   Dental Screening: Recommended annual dental exams for proper oral hygiene  Community Resource Referral:  CRR required this visit?  No       Plan:  I have personally reviewed and addressed the Medicare Annual Wellness questionnaire and have noted the following in the patient's chart:  A. Medical and social history B. Use of alcohol, tobacco or illicit drugs  C. Current medications and supplements D. Functional ability and status E.  Nutritional status F.  Physical activity G. Advance directives H. List of other physicians I.  Hospitalizations, surgeries, and ER visits in previous 12 months J.  [place] such as hearing and vision if needed, cognitive and depression L. Referrals and appointments   In addition, I have reviewed and discussed with patient certain preventive protocols, quality metrics, and best practice recommendations. A written personalized care plan for preventive services as well as general preventive health recommendations were provided to patient.  Signed,   Talitha Givens, MHS, PA-C [facility]

## 2022-07-27 ENCOUNTER — Ambulatory Visit: Payer: Medicare Other | Attending: Internal Medicine

## 2022-07-27 DIAGNOSIS — M6281 Muscle weakness (generalized): Secondary | ICD-10-CM | POA: Insufficient documentation

## 2022-07-27 DIAGNOSIS — R2681 Unsteadiness on feet: Secondary | ICD-10-CM | POA: Diagnosis not present

## 2022-07-27 DIAGNOSIS — H18231 Secondary corneal edema, right eye: Secondary | ICD-10-CM | POA: Diagnosis not present

## 2022-07-27 DIAGNOSIS — H401132 Primary open-angle glaucoma, bilateral, moderate stage: Secondary | ICD-10-CM | POA: Diagnosis not present

## 2022-07-27 DIAGNOSIS — R1312 Dysphagia, oropharyngeal phase: Secondary | ICD-10-CM | POA: Diagnosis not present

## 2022-07-27 DIAGNOSIS — R262 Difficulty in walking, not elsewhere classified: Secondary | ICD-10-CM | POA: Diagnosis not present

## 2022-07-27 NOTE — Therapy (Signed)
OUTPATIENT PHYSICAL THERAPY NEURO TREATMENT NOTE   Patient Name: Sydney Mcdaniel MRN: [id number] DOB:23-Jun-1925, 89 y.o., female Today's Date: 07/27/2022  PCP: Teodora Medici, DO REFERRING PROVIDER: Teodora Medici, DO    PT End of Session - 07/27/22 1251     Visit Number 22    Number of Visits 25    Date for PT Re-Evaluation 09/19/22    Authorization Time Period 04/03/22-06/26/2022    Progress Note Due on Visit 20    PT Start Time 0847    PT Stop Time 0927    PT Time Calculation (min) 40 min    Equipment Utilized During Treatment Gait belt    Activity Tolerance Patient tolerated treatment well;No increased pain    Behavior During Therapy WFL for tasks assessed/performed               Past Medical History:  Diagnosis Date   Allergy    Anemia    Cardiomyopathy ([place])    a. 02/2019 Echo: EF 60-65%, no rwma, doppler parameters consistent w/ pseudonormalization. Nl RV size/fxn; b. 06/2021 Echo: EF 40-45% w/ sev basal-mid antsept HK. GrII DD. Nl RV size/fxn. Mild MR/AI.   Carotid arterial disease ([place])    a. 04/2020 Carotid U/S: 1-39% bilat ICA stenosis, <50% bilat ECA stenosis; b. 06/2021 Carotid U/S: RICA 1-69%, LICA 67-89%.   Cataract    CLL (chronic lymphocytic leukemia) (HCC)    GERD (gastroesophageal reflux disease)    Glaucoma    H/O: hysterectomy    Total   History of stress test    a. 02/2019 MV: EF 60%, no ischemia/infarct.   Hyperlipidemia    Hypertension    Hypothyroidism    Impaired fasting glucose    Lichen sclerosus    Osteoporosis    Hips   PAF (paroxysmal atrial fibrillation) ([place])    a. 06/15/2016 Event monitor: 4% afib burden; b. CHA2DS2VASc - 4-->Xarelto.   Sinus bradycardia    a. avoid AVN blocking agents.   Syncope    a. 03/2016 Echo: EF 55-60%, no rwma, mild AI/MR, nl PASP; b. 03/2016 48h Holter: no significant arrhythmias/pauses; c. 03/2016 MV: mild apical defect, likely breast attenuation, nl EF, low risk; d. 05/2016 Event monitor: No  significant arrhythmia; e. 05/2016 Event monitor: PAF (4%).   Past Surgical History:  Procedure Laterality Date   APPENDECTOMY     EYE SURGERY     Glaucoma   MULTIPLE TOOTH EXTRACTIONS N/A    2 teeth   PACEMAKER IMPLANT N/A 09/22/2021   Procedure: PACEMAKER IMPLANT;  Surgeon: Deboraha Sprang, MD;  Location: [facility];  Service: Cardiovascular;  Laterality: N/A;   TOTAL ABDOMINAL HYSTERECTOMY     Patient Active Problem List   Diagnosis Date Noted   HFrEF (heart failure with reduced ejection fraction) ([place]) 05/06/2022   Sick sinus syndrome ([place]) 05/06/2022   General weakness 02/01/2022   Arm pain, anterior, right 01/30/2022   SIADH (syndrome of inappropriate ADH production) ([place]) 07/05/2021   Aspiration pneumonia ([place]) 05/21/2021   Elevated troponin 05/20/2021   Fall 02/11/2021   Normocytic anemia 05/31/2020   Goals of care, counseling/discussion 03/08/2020   Positive direct antiglobulin test (DAT) 12/04/2019   Sinus bradycardia 04/09/2019   First degree AV block 04/09/2019   Osteoporosis 08/21/2018   Bilateral carotid artery stenosis 04/19/2018   History of syncope 10/17/2017   MCI (mild cognitive impairment) 08/12/2017   Lung nodules 07/09/2017   Orthostatic lightheadedness 04/18/2017   Paroxysmal atrial fibrillation ([place]) 04/18/2017  Hyponatremia 10/20/2016   Chronic diastolic congestive heart failure ([place]) 06/28/2016   Syncope, near 05/24/2016   Primary open-angle glaucoma, bilateral, mild stage 05/08/2016   Bladder prolapse, female, acquired 02/25/2016   Asymptomatic bacteriuria 02/25/2016   B12 deficiency 01/11/2016   Anemia 01/10/2016   CLL (chronic lymphocytic leukemia) ([place]) 01/10/2016   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 She reports no current aches/pains and no stumbles/falls.   [name]: family member DTR  PERTINENT HISTORY: [name] back in 2022 for weakness, imbalance. She returns for generalized weakness, balance issues and falls following hospitalization 01/30/22-01/31/22 for acute on chronic hyponatremia, near syncope, RUE pain and generalized weakness. Pt biggest concern is her balance. 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   FALLS: Has patient fallen in last 6 months? Yes. Number of falls 1  LIVING ENVIRONMENT: Lives with: lives with their family and lives alone, but daughter lives next door Lives in: House/apartment Stairs:  pt has a ramp, but one step from back porch into the house (pt grabs onto shelves on R hand side) Has following equipment at home: Single point cane, Walker - 4 wheeled, Wheelchair (manual), shower chair, bed side commode, Grab bars, and Ramped entry  PLOF: Independent  PATIENT GOALS: "I want more balance"    OBJECTIVE: taken at eval unless  otherwise specified   DIAGNOSTIC FINDINGS:   CT head WO contrast (5MM) 8/7/20203 per chart "IMPRESSION: No evidence of acute intracranial abnormality."  COGNITION: Overall cognitive status: History of cognitive impairments - at baseline per pt's daughter, mild cog impairment per chart   SENSATION: Pt daughter reports usually LLE impaired vs. RLE.  Pt able to feel light touch to BLE on exam  COORDINATION: WNL BUE and BLE   EDEMA:  Dtr reports swelling in LLE compared to RLE (reports chronic issue)   POSTURE: rounded shoulders, forward head, and increased thoracic kyphosis    LOWER EXTREMITY MMT:    Grossly 4+/5 BLE exception hip flexors 4-/5 BLE   TRANSFERS: Assistive device utilized: Single point cane  Sit to stand: Modified independence Stand to sit: Modified independence Chair to chair: Modified independence   GAIT: Gait pattern: step to pattern, decreased arm swing- Left, decreased stride length, and narrow BOS, dec B hip ext throughout gait cycle Distance walked: 10 m Assistive device utilized: Single point cane Level of assistance:  Close CGA, pt reports fear of falls/unsteadiness observed   FUNCTIONAL TESTs:  5 times sit to stand: 28 seconds  Timed up and go (TUG): 32  sec with SPC 10 meter walk test: 0.45 m/s with SPC close CGA due to unsteadiness  Berg Balance Scale: deferred   PATIENT SURVEYS:  FOTO 38 (goal score 47)  TODAY'S TREATMENT:  Gait belt donned and CGA provided unless specified otherwise  TherEx: 5# AW each LE- --LAQ 10x, 15x each LE --March 15x each LE -- DF 20x bilat  STS 15x Staggered STS 6x each LE. Rates hard. Cuing throughout for technique  NMR: At support surface-  Firm surface- pt performs the following sequence for several minutes WBOS EO > WBOS horizontal head turns >WBOS vertical head turns NBOS EO > NBOS horizontal head turns >NBOS vertical head turns >EC x60 sec  Slow marches with decreasing UE support, focus on  RLE balance x multiple reps each LE  Sustained heel raise (bilat) with decreasing UE support 5 reps 5-15 sec each  PATIENT EDUCATION: Education details: Pt educated throughout session about proper posture and technique with exercises. Improved exercise technique, movement at target joints, use of target muscles after min to mod verbal, visual, tactile cues. Goals, plan  Person educated: Patient and DTR Education method: Explanation, Demonstration, Tactile cues, and Verbal cues Education comprehension: verbalized understanding, returned demonstration, verbal cues required, tactile cues required, and needs further education   HOME EXERCISE PROGRAM: Pt to continue HEP as previously given  Instruction in use of theraputty  Access Code: [email or web address] URL: [email or web address] Date: 04/03/2022 Prepared by: Ricard Dillon  Exercises - Seated March  - 1 x daily - 5 x weekly - 3 sets - 20 reps - alternating leg hold    GOALS: Goals reviewed with patient? Yes  SHORT TERM GOALS: Target date: 09/07/2022  Patient will be independent in home exercise program to improve strength/mobility for better functional independence with ADLs. Baseline: initiated; 06/27/21: pt HEP to be advanced; 06/29/22: to be advanced, pt DTR reports pt feels comfortable is indep Goal status: MET    LONG TERM GOALS: Target date: 10/19/2022  Patient will increase FOTO score to equal to or greater than  47   to demonstrate statistically significant improvement in mobility and quality of life.  Baseline: 38 05/25/22: 42; 1/2: deferred; 06/29/22: 44; 07/20/22: 39 Goal status: PARTIALLY MET  2.  Patient (> 33 years old) will complete five times sit to stand test in < 15 seconds indicating an increased LE strength and improved balance. Baseline: 28 sec use of UE 05/25/22: 19.57 sec use of UE's on knees; 1/2: deferred; `/4/24: 23 sec hands but hands-free; 1/25: 19 sec hands-free Goal status: IN PROGRESS  3. Patient  will increase 10 meter walk test to >1.79ms as to improve gait speed for better community ambulation and to reduce fall risk.  Baseline: 0.45 m/s w SPC, close CGA 05/25/22: 0.56 m/s w SPC, close CGA; 1/2: deferred; 1/4: 0.45 m/s with SPC close CGA ; 1/25: 0.63 m/s with SPC Goal status: IN PROGRESS  4.   Patient will reduce timed up and go to <15 seconds to reduce fall risk and demonstrate improved transfer/gait ability. [facility] 29.19 sec with SPC; 06/27/22: deferred; 06/29/22: 34 seconds with SPC and close CGA; 1/25: 26 sec with SPC, close CGA Goal status: IN PROGRESS  5.   Patient will increase Berg Balance score by > 6 points to demonstrate decreased fall risk during functional activities. Baseline: to be completed next 1-2 sessions; 10/18: 32/56 05/25/22:  37/56; 06/27/22: deferred; 06/29/22: 38/56 ; 1/25: 38/40 Goal status: IN PROGRESS   ASSESSMENT:  CLINICAL IMPRESSION: Pt further progresses LE strengthening interventions in weight and reps. While pt shows progress, she struggled with staggered STS, requiring increased cuing for correct technique. Pt also with difficulty performing SLB tasks. The pt will continue to benefit from skilled physical therapy intervention to address impairments, improve QOL, and attain therapy goals.      OBJECTIVE IMPAIRMENTS Abnormal gait, decreased activity tolerance, decreased balance, decreased cognition, decreased endurance, decreased knowledge of use of DME, decreased mobility, difficulty walking, decreased strength, impaired vision/preception, improper body mechanics, postural dysfunction, and pain.   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   REHAB POTENTIAL: Good  CLINICAL DECISION MAKING: Evolving/moderate complexity  EVALUATION COMPLEXITY: Moderate  PLAN: PT FREQUENCY: 2x/week  PT DURATION: 12 weeks  PLANNED INTERVENTIONS: Therapeutic exercises, Therapeutic activity, Neuromuscular re-education, Balance training, Gait training, Patient/Family education, Self Care, Joint mobilization, Stair training, Vestibular training, Canalith repositioning, Orthotic/Fit training, DME instructions, Wheelchair mobility training, Spinal mobilization, Cryotherapy, Moist heat, scar mobilization, Splintting, Taping, Ultrasound, Parrafin, Biofeedback, Manual therapy, and Re-evaluation  PLAN FOR NEXT SESSION:   Strength, balance, SPC gait training, continue plan   Zollie Pee PT  07/27/22, 1:04 PM

## 2022-07-31 ENCOUNTER — Telehealth: Payer: Self-pay

## 2022-07-31 NOTE — Progress Notes (Signed)
Care Management & Coordination Services Pharmacy Team Pharmacy Assistant   Name: Sydney Mcdaniel  MRN: [id number] DOB: September 16, 1924  Reason for Encounter: Appointment Reminder  Chart review:  Conditions to be addressed/monitored: Atrial Fibrillation, CHF, HTN, HLD, Hypothyroidism, Osteoporosis, and Carotid Atherosclerosis, Syncope, Orthostatic lightheadedness, Bilateral Carotid Artery Stenosis, Sinus Bradycardia, First Degree AV Block, Sick sinus syndrome, Anemia, CLL,   Recent office visits:  01/30/024 Talitha Givens, PA-C (PCP Telemedicine Visit) for Medicare Wellness Exam- No medication changes noted, Bone Density order placed  05/09/2022 Teodora Medici, DO (PCP Office Visit) for Follow-up- No medication changes noted, No orders placed, Patient instructed to follow-up in 6 months  02/02/2022 Teodora Medici, DO (PCP Office Visit) for ER Follow-up- No medication changes noted, Lab orders placed, DG Forearm Right order placed, BP: 140/62, Patient instructed to follow-up in 3 months  Recent consult visits:  07/18/2022 Gerrie Nordmann, NP (Cardiology) for Follow-up- No medication changes noted, EKG order placed, BP 142/72, Patient instructed to follow-up in 6 months  06/23/2022 Mayra Reel, NP (Cardiology) for Follow-up- Stopped: Latanoprost, Timolol Malcate, Changed: Amlodipine 2.5 mg daily to hold if blood pressure less than 938 systolic., EKG ordered, Patient instructed to follow-up in 1 month,  05/04/2022 Nelva Bush, MD (Cardiology) for Office Visit- Stopped: Midodrine HCl 2.5 mg, EKG-12 lead ordered, BP 190/94, Patient instructed to follow-up in 1 month  04/03/2022 Ricard Dillon, PT (Physical Therapy) for PT Initial Evaluation- No medication changes noted, PT plan of care orders placed, Patient PT twice weekly X 12 weeks  02/13/2022 Munsoor Lateef, MD (Nephrology) for Follow-up- Started: Sodium chloride 1 g tablet twice daily, No orders placed, Patient instructed to follow-up  in 6 months  01/30/2022 [place] (Internal Med) for Initial Consult- I am unable to view this note  Hospital visits:   Medication Reconciliation was completed by comparing discharge summary, patient's EMR and Pharmacy list, and upon discussion with patient.  Admitted to the Urgent Care on 06/04/2022 due to COVID-19. Discharge date was 06/04/2022. Discharged from [facility] [place]?Medications Started at [facility] Discharge:?? -Started  Molnupiravir 800 mg Oral 2 times daily  Medication Changes at Hospital Discharge: -Changed None ID  Medications Discontinued at Hospital Discharge: -STOP taking: senna 8.6 MG tablet (SENOKOT)  Medications that remain the same after Hospital Discharge:??  -All other medications will remain the same.    Medication Reconciliation was completed by comparing discharge summary, patient's EMR and Pharmacy list, and upon discussion with patient.  Admitted to the hospital on 01/30/2022 due to Arm Pain. Discharge date was 01/31/2022. Discharged from [place]?Medications Started at [facility] Discharge:?? -Started None ID  Medication Changes at Hospital Discharge: -Changed None ID  Medications Discontinued at Hospital Discharge: -STOP taking: senna 8.6 MG tablet (SENOKOT)  Medications that remain the same after Hospital Discharge:??  -All other medications will remain the same.    Medications: Outpatient Encounter Medications as of 07/31/2022  Medication Sig   acetaminophen (TYLENOL) 500 MG tablet Take 500 mg by mouth every 8 (eight) hours as needed for moderate pain.   alendronate (FOSAMAX) 70 MG tablet TAKE 1 TABLET BY MOUTH EVERY FRIDAY ON AN EMPTY STOMACH WITH A FULL GLASS OF WATER   amLODipine (NORVASC) 5 MG tablet Take 0.5 tablets (2.5 mg total) by mouth daily. Hold for a blood pressure less than 101 systolic (top number)   atorvastatin (LIPITOR) 20 MG tablet TAKE 1 TABLET BY  MOUTH ONCE DAILY IN  THE EVENING   Cholecalciferol 25 MCG (1000 UT) tablet Take by mouth.   ferrous sulfate 325 (65 FE) MG tablet Take 2 tablets (650 mg total) by mouth daily with breakfast.   LATANOPROST OP Place 1 drop into the right eye at bedtime.   levothyroxine (EUTHYROX) 75 MCG tablet TAKE ONE TABLET BY MOUTH ON MONDAYS, WEDNESDAYS, FRIDAYS, AND SUNDAYS   levothyroxine (SYNTHROID) 88 MCG tablet TAKE 1 TABLET BY MOUTH ON TUESDAYS, THURSDAYS AND SATURDAYS   lisinopril (ZESTRIL) 40 MG tablet Take 1 tablet (40 mg total) by mouth daily.   Loteprednol Etabonate (LOTEMAX OP) Apply 1 drop to eye at bedtime. To right eye   Netarsudil Dimesylate (RHOPRESSA) 0.02 % SOLN Place 1 drop into the right eye at bedtime. (Patient not taking: Reported on 07/25/2022)   ondansetron (ZOFRAN) 8 MG tablet Take 8 mg by mouth as needed.   polyethylene glycol (MIRALAX / GLYCOLAX) 17 g packet Take 17 g by mouth daily.   Rivaroxaban (XARELTO) 15 MG TABS tablet TAKE 1 TABLET EVERY DAY WITH SUPPER   sodium chloride (MURO 128) 5 % ophthalmic solution Place 1 drop into the right eye 2 (two) times daily.   sodium chloride 1 g tablet Take 1 g by mouth. Takes 1/2 tablet with breakfast and lunch and 1 whole tablet with supper   spironolactone (ALDACTONE) 25 MG tablet Take 1 tablet by mouth once daily   TIMOLOL MALEATE PF OP Place 1 drop into the right eye in the morning and at bedtime.   vitamin B-12 (CYANOCOBALAMIN) 100 MCG tablet Take 100 mcg by mouth daily.   No facility-administered encounter medications on file as of 07/31/2022.   Contacted patient to confirm telephone appointment with Junius Argyle, CPP, PharmD on 08/02/2022 at 0900. {US HC Outreach:[id number]}  Do you have any problems getting your medications? {yes/no:20286} If yes what types of problems are you experiencing? {Problems:27223}  What is your top health concern you would like to discuss at your upcoming visit?   Have you seen any other providers since your  last visit with PCP? {yes/no:20286}  Star Rating Drugs:  Atorvastatin 20 mg last filled on 07/21/2022 for a 90-Day supply with [facility] Lisinopril 40 mg last filled on 07/18/2022 for a 90-Day supply with [facility]: Annual wellness visit in last year? Yes this year in January  If Diabetic: Last eye exam / retinopathy screening: Last diabetic foot exam:  Lynann Bologna, CPA/CMA Clinical Pharmacist Assistant Phone: [phone]

## 2022-08-01 ENCOUNTER — Ambulatory Visit: Payer: Medicare Other

## 2022-08-01 DIAGNOSIS — R2681 Unsteadiness on feet: Secondary | ICD-10-CM | POA: Diagnosis not present

## 2022-08-01 DIAGNOSIS — R262 Difficulty in walking, not elsewhere classified: Secondary | ICD-10-CM

## 2022-08-01 DIAGNOSIS — M6281 Muscle weakness (generalized): Secondary | ICD-10-CM

## 2022-08-01 DIAGNOSIS — R1312 Dysphagia, oropharyngeal phase: Secondary | ICD-10-CM | POA: Diagnosis not present

## 2022-08-01 NOTE — Therapy (Signed)
OUTPATIENT PHYSICAL THERAPY NEURO TREATMENT NOTE   Patient Name: Sydney Mcdaniel MRN: [id number] DOB:[birth year], 89 y.o., female Today's Date: 08/01/2022  PCP: Teodora Medici, DO REFERRING PROVIDER: Teodora Medici, DO    PT End of Session - 08/01/22 0845     Visit Number 23    Number of Visits 25    Date for PT Re-Evaluation 09/19/22    Authorization Time Period 04/03/22-06/26/2022    Progress Note Due on Visit 20    PT Start Time 0847    PT Stop Time 0930    PT Time Calculation (min) 43 min    Equipment Utilized During Treatment Gait belt    Activity Tolerance Patient tolerated treatment well;No increased pain    Behavior During Therapy WFL for tasks assessed/performed               Past Medical History:  Diagnosis Date   Allergy    Anemia    Cardiomyopathy ([place])    a. 02/2019 Echo: EF 60-65%, no rwma, doppler parameters consistent w/ pseudonormalization. Nl RV size/fxn; b. 06/2021 Echo: EF 40-45% w/ sev basal-mid antsept HK. GrII DD. Nl RV size/fxn. Mild MR/AI.   Carotid arterial disease ([place])    a. 04/2020 Carotid U/S: 1-39% bilat ICA stenosis, <50% bilat ECA stenosis; b. 06/2021 Carotid U/S: RICA 7-67%, LICA 34-19%.   Cataract    CLL (chronic lymphocytic leukemia) (HCC)    GERD (gastroesophageal reflux disease)    Glaucoma    H/O: hysterectomy    Total   History of stress test    a. 02/2019 MV: EF 60%, no ischemia/infarct.   Hyperlipidemia    Hypertension    Hypothyroidism    Impaired fasting glucose    Lichen sclerosus    Osteoporosis    Hips   PAF (paroxysmal atrial fibrillation) ([place])    a. 06/15/2016 Event monitor: 4% afib burden; b. CHA2DS2VASc - 4-->Xarelto.   Sinus bradycardia    a. avoid AVN blocking agents.   Syncope    a. 03/2016 Echo: EF 55-60%, no rwma, mild AI/MR, nl PASP; b. 03/2016 48h Holter: no significant arrhythmias/pauses; c. 03/2016 MV: mild apical defect, likely breast attenuation, nl EF, low risk; d. 05/2016 Event monitor: No  significant arrhythmia; e. 05/2016 Event monitor: PAF (4%).   Past Surgical History:  Procedure Laterality Date   APPENDECTOMY     EYE SURGERY     Glaucoma   MULTIPLE TOOTH EXTRACTIONS N/A    2 teeth   PACEMAKER IMPLANT N/A 09/22/2021   Procedure: PACEMAKER IMPLANT;  Surgeon: Deboraha Sprang, MD;  Location: [facility];  Service: Cardiovascular;  Laterality: N/A;   TOTAL ABDOMINAL HYSTERECTOMY     Patient Active Problem List   Diagnosis Date Noted   HFrEF (heart failure with reduced ejection fraction) ([place]) 05/06/2022   Sick sinus syndrome ([place]) 05/06/2022   General weakness 02/01/2022   Arm pain, anterior, right 01/30/2022   SIADH (syndrome of inappropriate ADH production) ([place]) 07/05/2021   Aspiration pneumonia ([place]) 05/21/2021   Elevated troponin 05/20/2021   Fall 02/11/2021   Normocytic anemia 05/31/2020   Goals of care, counseling/discussion 03/08/2020   Positive direct antiglobulin test (DAT) 12/04/2019   Sinus bradycardia 04/09/2019   First degree AV block 04/09/2019   Osteoporosis 08/21/2018   Bilateral carotid artery stenosis 04/19/2018   History of syncope 10/17/2017   MCI (mild cognitive impairment) 08/12/2017   Lung nodules 07/09/2017   Orthostatic lightheadedness 04/18/2017   Paroxysmal atrial fibrillation ([place]) 04/18/2017  Hyponatremia 10/20/2016   Chronic diastolic congestive heart failure ([place]) 06/28/2016   Syncope, near 05/24/2016   Primary open-angle glaucoma, bilateral, mild stage 05/08/2016   Bladder prolapse, female, acquired 02/25/2016   Asymptomatic bacteriuria 02/25/2016   B12 deficiency 01/11/2016   Anemia 01/10/2016   CLL (chronic lymphocytic leukemia) ([place]) 01/10/2016   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 12/21/2015   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 12/21/2015   Bradycardia 12/21/2015   Lymphocytosis 06/25/2015   Impaired fasting glucose 06/22/2015   Carotid atherosclerosis 06/22/2015   Essential hypertension  12/22/2014   Hyperlipidemia 12/22/2014   Hypothyroidism 12/22/2014    ONSET DATE: 01/30/2022  REFERRING DIAG: R26.9 (ICD-10-CM) - Abnormality of gait and mobility  THERAPY DIAG:  Unsteadiness on feet  Muscle weakness (generalized)  Difficulty in walking, not elsewhere classified  Rationale for Evaluation and Treatment Rehabilitation  SUBJECTIVE:   SUBJECTIVE STATEMENT:  Pt reports no aches/pains and no stumbles/falls. Pt reports doing some of her HEP.  [name]: family member DTR  PERTINENT HISTORY: [name] back in 2022 for weakness, imbalance. She returns for generalized weakness, balance issues and falls following hospitalization 01/30/22-01/31/22 for acute on chronic hyponatremia, near syncope, RUE pain and generalized weakness. Pt biggest concern is her balance. 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   FALLS: Has patient fallen in last 6 months? Yes. Number of falls 1  LIVING ENVIRONMENT: Lives with: lives with their family and lives alone, but daughter lives next door Lives in: House/apartment Stairs:  pt has a ramp, but one step from back porch into the house (pt grabs onto shelves on R hand side) Has following equipment at home: Single point cane, Walker - 4 wheeled, Wheelchair (manual), shower chair, bed side commode, Grab bars, and Ramped entry  PLOF: Independent  PATIENT GOALS: "I want more balance"     OBJECTIVE: taken at eval unless otherwise specified   DIAGNOSTIC FINDINGS:   CT head WO contrast (5MM) 8/7/20203 per chart "IMPRESSION: No evidence of acute intracranial abnormality."  COGNITION: Overall cognitive status: History of cognitive impairments - at baseline per pt's daughter, mild cog impairment per chart   SENSATION: Pt daughter reports usually LLE impaired vs. RLE.  Pt able to feel light touch to BLE on exam  COORDINATION: WNL BUE and BLE   EDEMA:  Dtr reports swelling in LLE compared to RLE (reports chronic issue)   POSTURE: rounded shoulders, forward head, and increased thoracic kyphosis    LOWER EXTREMITY MMT:    Grossly 4+/5 BLE exception hip flexors 4-/5 BLE   TRANSFERS: Assistive device utilized: Single point cane  Sit to stand: Modified independence Stand to sit: Modified independence Chair to chair: Modified independence   GAIT: Gait pattern: step to pattern, decreased arm swing- Left, decreased stride length, and narrow BOS, dec B hip ext throughout gait cycle Distance walked: 10 m Assistive device utilized: Single point cane Level of assistance:  Close CGA, pt reports fear of falls/unsteadiness observed   FUNCTIONAL TESTs:  5 times sit to stand: 28 seconds  Timed up and  go (TUG): 32 sec with SPC 10 meter walk test: 0.45 m/s with SPC close CGA due to unsteadiness  Berg Balance Scale: deferred   PATIENT SURVEYS:  FOTO 38 (goal score 47)  TODAY'S TREATMENT:  Gait belt donned and CGA provided unless specified otherwise  NMR: At support surface-  Alt LE tap onto and off of 6" step x multiple reps, focus on tapping with RLE to promote SLB on LLE (pt performed increased reps with this positioning). Intermittent UE support throughout  Static stand with one foot on floor, one foot on 6" step x multiple rounds of 30 seconds with focus on standing primarily on LLE on floor. Intermittent UE support  Tandem stance, focus  on LLE as stance  leg x multiple rounds of 30 seconds. Intermittent UE support  Stand and reach with UE, tap wall, standing from various distances (pt standing approx 2 ft from wall, adjusts by a few small steps back per set)  LLE on floor, RLE on airex pad: x 30 sec static stand, x 30 sec with dual cog task, then 10x with vertical head turns, 10x with horizontal head turns  Other comments: Pt requires min assist throughout with the above interventions  TherEx: Staggered STS 1x6 each LE, 1x4 each LE. Rates hard. Cuing throughout for technique with TC for foot placement   PATIENT EDUCATION: Education details: Pt educated throughout session about proper posture and technique with exercises. Improved exercise technique, movement at target joints, use of target muscles after min to mod verbal, visual, tactile cues. Goals, plan  Person educated: Patient and DTR Education method: Explanation, Demonstration, Tactile cues, and Verbal cues Education comprehension: verbalized understanding, returned demonstration, verbal cues required, tactile cues required, and needs further education   HOME EXERCISE PROGRAM: Pt to continue HEP as previously given  Instruction in use of theraputty  Access Code: [email or web address] URL: [email or web address] Date: 04/03/2022 Prepared by: Ricard Dillon  Exercises - Seated March  - 1 x daily - 5 x weekly - 3 sets - 20 reps - alternating leg hold    GOALS: Goals reviewed with patient? Yes  SHORT TERM GOALS: Target date: 09/12/2022  Patient will be independent in home exercise program to improve strength/mobility for better functional independence with ADLs. Baseline: initiated; 06/27/21: pt HEP to be advanced; 06/29/22: to be advanced, pt DTR reports pt feels comfortable is indep Goal status: MET    LONG TERM GOALS: Target date: 10/24/2022  Patient will increase FOTO score to equal to or greater than  47   to demonstrate statistically significant improvement in mobility  and quality of life.  Baseline: 38 05/25/22: 42; 1/2: deferred; 06/29/22: 44; 07/20/22: 39 Goal status: PARTIALLY MET  2.  Patient (> 87 years old) will complete five times sit to stand test in < 15 seconds indicating an increased LE strength and improved balance. Baseline: 28 sec use of UE 05/25/22: 19.57 sec use of UE's on knees; 1/2: deferred; `/4/24: 23 sec hands but hands-free; 1/25: 19 sec hands-free Goal status: IN PROGRESS  3. Patient will increase 10 meter walk test to >1.4ms as to improve gait speed for better community ambulation and to reduce fall risk.  Baseline: 0.45 m/s w SPC, close CGA 05/25/22: 0.56 m/s w SPC, close CGA; 1/2: deferred; 1/4: 0.45 m/s with SPC close CGA ; 1/25: 0.63 m/s with SPC Goal status: IN PROGRESS  4.   Patient will reduce timed up and go to <15 seconds to reduce fall risk and demonstrate improved transfer/gait  ability. Baseline: 32 sec with [facility] 05/25/22: 29.19 sec with SPC; 06/27/22: deferred; 06/29/22: 34 seconds with SPC and close CGA; 1/25: 26 sec with SPC, close CGA Goal status: IN PROGRESS  5.   Patient will increase Berg Balance score by > 6 points to demonstrate decreased fall risk during functional activities. Baseline: to be completed next 1-2 sessions; 10/18: 32/56 05/25/22:  37/56; 06/27/22: deferred; 06/29/22: 38/56 ; 1/25: 38/40 Goal status: IN PROGRESS   ASSESSMENT:  CLINICAL IMPRESSION: Pt found to have difficulty with LLE balance > RLE. Large focus of todays' session was on addressing this deficit. Pt required intermittent UE suppor and up to min assist throughout with these interventions. The pt will continue to benefit from skilled physical therapy intervention to address impairments, improve QOL, and attain therapy goals.      OBJECTIVE IMPAIRMENTS Abnormal gait, decreased activity tolerance, decreased balance, decreased cognition, decreased endurance, decreased knowledge of use of DME, decreased mobility, difficulty walking, decreased  strength, impaired vision/preception, improper body mechanics, postural dysfunction, and pain.   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   REHAB POTENTIAL: Good  CLINICAL DECISION MAKING: Evolving/moderate complexity  EVALUATION COMPLEXITY: Moderate  PLAN: PT FREQUENCY: 2x/week  PT DURATION: 12 weeks  PLANNED INTERVENTIONS: Therapeutic exercises, Therapeutic activity, Neuromuscular re-education, Balance training, Gait training, Patient/Family education, Self Care, Joint mobilization, Stair training, Vestibular training, Canalith repositioning, Orthotic/Fit training, DME instructions, Wheelchair mobility training, Spinal mobilization, Cryotherapy, Moist heat, scar mobilization, Splintting, Taping, Ultrasound, Parrafin, Biofeedback, Manual therapy, and Re-evaluation  PLAN FOR NEXT SESSION:   Strength, balance, SPC gait training, continue plan, LLE balance   Zollie Pee PT  08/01/22, 9:51 AM

## 2022-08-02 ENCOUNTER — Ambulatory Visit: Payer: Medicare Other

## 2022-08-02 VITALS — BP 132/60

## 2022-08-02 DIAGNOSIS — M810 Age-related osteoporosis without current pathological fracture: Secondary | ICD-10-CM

## 2022-08-02 DIAGNOSIS — I480 Paroxysmal atrial fibrillation: Secondary | ICD-10-CM

## 2022-08-02 DIAGNOSIS — I10 Essential (primary) hypertension: Secondary | ICD-10-CM

## 2022-08-02 DIAGNOSIS — I5020 Unspecified systolic (congestive) heart failure: Secondary | ICD-10-CM

## 2022-08-02 NOTE — Progress Notes (Signed)
Care Management & Coordination Services Pharmacy Note  08/02/2022 Name:  Sydney Mcdaniel MRN:  [id number] DOB:  [birth year]  Summary:  Patient presents for initial pharmacy consult. Today's visit was conducted with the patient and [name], the patient's daughter.   -Affordability concerns with Xarelto. After review with patient's daughter, patient may qualify for patient assistance for Eliquis.   -Home blood pressures controlled with occasional use of PRN amlodipine.   Weighs daily, weight has been decreased due to poor appetite following dental extraction. Weight was previously 130s, now down to 124 today. Patient drinks 1/2 of a protein shake every other day.   Recommendations/Changes made from today's visit: Will discuss changing Xarelto to Eliquis with Cardiology and if in agreement will begin paperwork for assistance.  Follow up plan: CPP follow-up 4 months    Subjective: Sydney Mcdaniel is an 89 y.o. year old female who is a primary patient of Teodora Medici, DO.  The care coordination team was consulted for assistance with disease management and care coordination needs.    Engaged with patient by telephone for follow up visit.  Recent office visits: 01/30/024 Talitha Givens, PA-C (PCP Telemedicine Visit) for Medicare Wellness Exam- No medication changes noted, Bone Density order placed   05/09/2022 Teodora Medici, DO (PCP Office Visit) for Follow-up- No medication changes noted, No orders placed, Patient instructed to follow-up in 6 months   02/02/2022 Teodora Medici, DO (PCP Office Visit) for ER Follow-up- No medication changes noted, Lab orders placed, DG Forearm Right order placed, BP: 140/62, Patient instructed to follow-up in 3 months  Recent consult visits: 07/18/2022 Gerrie Nordmann, NP (Cardiology) for Follow-up- No medication changes noted, EKG order placed, BP 142/72, Patient instructed to follow-up in 6 months   06/23/2022 Mayra Reel, NP (Cardiology)  for Follow-up- Stopped: Latanoprost, Timolol Malcate, Changed: Amlodipine 2.5 mg daily to hold if blood pressure less than 644 systolic., EKG ordered, Patient instructed to follow-up in 1 month,   05/04/2022 Nelva Bush, MD (Cardiology) for Office Visit- Stopped: Midodrine HCl 2.5 mg, EKG-12 lead ordered, BP 190/94, Patient instructed to follow-up in 1 month   04/03/2022 Ricard Dillon, PT (Physical Therapy) for PT Initial Evaluation- No medication changes noted, PT plan of care orders placed, Patient PT twice weekly X 12 weeks   02/13/2022 Munsoor Lateef, MD (Nephrology) for Follow-up- Started: Sodium chloride 1 g tablet twice daily, No orders placed, Patient instructed to follow-up in 6 months   01/30/2022 Cyndie Chime (Internal Med) for Initial Consult- I am unable to view this note  Hospital visits: Admitted to the Urgent Care on 06/04/2022 due to COVID-19. Discharge date was 06/04/2022. Discharged from [facility] [place]    Admitted to the hospital on 01/30/2022 due to Arm Pain. Discharge date was 01/31/2022. Discharged from [facility].   Objective:  Lab Results  Component Value Date   CREATININE 0.99 (H) 02/02/2022   BUN 16 02/02/2022   EGFR 65 09/13/2021   GFRNONAA >60 01/31/2022   GFRAA 75 10/01/2020   NA 131 (L) 02/02/2022   K 5.0 02/02/2022   CALCIUM 9.4 02/02/2022   CO2 24 02/02/2022   GLUCOSE 95 02/02/2022    Lab Results  Component Value Date/Time   HGBA1C 6.0 (H) 07/02/2018 10:40 AM   HGBA1C 6.3 (H) 12/21/2015 09:50 AM   MICROALBUR 30 (H) 06/22/2015 09:48 AM    Last diabetic Eye exam:  Lab Results  Component Value Date/Time   HMDIABEYEEXA No Retinopathy 10/24/2016 12:00  AM    Last diabetic Foot exam: No results found for: "HMDIABFOOTEX"   Lab Results  Component Value Date   CHOL 171 11/17/2020   HDL 55 11/17/2020   LDLCALC 90 11/17/2020   TRIG 147 11/17/2020   CHOLHDL 3.1 11/17/2020       Latest Ref  Rng & Units 01/31/2022    1:55 AM 01/30/2022    5:52 PM 05/19/2021    8:52 PM  Hepatic Function  Total Protein 6.5 - 8.1 g/dL 7.1  6.9  7.2   Albumin 3.5 - 5.0 g/dL 3.8  3.9  3.8   AST 15 - 41 U/L '18  21  26   '$ ALT 0 - 44 U/L '12  11  19   '$ Alk Phosphatase 38 - 126 U/L '26  27  27   '$ Total Bilirubin 0.3 - 1.2 mg/dL 1.0  0.9  0.6   Bilirubin, Direct 0.0 - 0.2 mg/dL  0.1  <0.1     Lab Results  Component Value Date/Time   TSH 2.05 03/03/2021 02:47 PM   TSH 4.253 02/11/2021 02:20 AM   TSH 5.82 (H) 09/13/2020 11:27 AM   FREET4 0.92 11/26/2019 10:29 AM   FREET4 1.8 12/21/2015 09:50 AM       Latest Ref Rng & Units 01/31/2022    1:55 AM 01/30/2022   11:26 PM 01/30/2022    1:28 PM  CBC  WBC 4.0 - 10.5 K/uL 8.5  8.6  8.3   Hemoglobin 12.0 - 15.0 g/dL 11.1  10.4  11.6   Hematocrit 36.0 - 46.0 % 32.6  31.1  34.8   Platelets 150 - 400 K/uL 188  179  205     Lab Results  Component Value Date/Time   VD25OH 35 09/09/2018 09:50 AM   VITAMINB12 452 11/16/2021 01:39 PM   VITAMINB12 906 02/23/2021 09:14 AM    Clinical ASCVD: No  The ASCVD Risk score (Arnett DK, et al., 2019) failed to calculate for the following reasons:   The 2019 ASCVD risk score is only valid for ages 78 to 71       07/25/2022   10:18 AM 05/09/2022   10:14 AM 02/02/2022   11:41 AM  Depression screen PHQ 2/9  Decreased Interest 0 0 0  Down, Depressed, Hopeless 0 0 0  PHQ - 2 Score 0 0 0  Altered sleeping 0 0 0  Tired, decreased energy 0 0 0  Change in appetite 0 0 0  Feeling bad or failure about yourself  0 0 0  Trouble concentrating 0 0 0  Moving slowly or fidgety/restless 0 0 0  Suicidal thoughts 0 0 0  PHQ-9 Score 0 0 0  Difficult doing work/chores Not difficult at all Not difficult at all Not difficult at all     Social History   Tobacco Use  Smoking Status Never  Smokeless Tobacco Never   BP Readings from Last 3 Encounters:  07/18/22 (!) 142/72  06/23/22 (!) 112/50  06/04/22 (!) 183/72   Pulse Readings  from Last 3 Encounters:  07/18/22 73  06/23/22 80  06/04/22 80   Wt Readings from Last 3 Encounters:  07/18/22 129 lb 6.4 oz (58.7 kg)  06/23/22 125 lb (56.7 kg)  06/04/22 135 lb 12.9 oz (61.6 kg)   BMI Readings from Last 3 Encounters:  07/18/22 23.67 kg/m  06/23/22 22.86 kg/m  06/04/22 24.84 kg/m    Allergies  Allergen Reactions   Alphagan [Brimonidine] Itching and Rash   Pravachol [  Pravastatin Sodium] Itching and Rash    Medications Reviewed Today     Reviewed by Zollie Pee, PT (Physical Therapist) on 08/01/22 at [phone]  Med List Status: <None>   Medication Order Taking? Sig Documenting Provider Last Dose Status Informant  acetaminophen (TYLENOL) 500 MG tablet [id number] No Take 500 mg by mouth every 8 (eight) hours as needed for moderate pain. [provider] Taking Active Family Member  alendronate (FOSAMAX) 70 MG tablet [id number] No TAKE 1 TABLET BY MOUTH EVERY FRIDAY ON AN EMPTY STOMACH WITH A FULL GLASS OF WATER Teodora Medici, DO Taking Active   amLODipine (NORVASC) 5 MG tablet [id number] No Take 0.5 tablets (2.5 mg total) by mouth daily. Hold for a blood pressure less than 921 systolic (top number) Mayra Reel, NP Taking Active   atorvastatin (LIPITOR) 20 MG tablet [id number] No TAKE 1 TABLET BY MOUTH ONCE DAILY IN THE Debara Pickett, DO Taking Active   Cholecalciferol 25 MCG (1000 UT) tablet [id number] No Take by mouth. [provider] Taking Active   ferrous sulfate 325 (65 FE) MG tablet [id number] No Take 2 tablets (650 mg total) by mouth daily with breakfast. Jennye Boroughs, MD Taking Active   LATANOPROST OP [id number] No Place 1 drop into the right eye at bedtime. [provider] Taking Active   levothyroxine (EUTHYROX) 75 MCG tablet [id number] No TAKE ONE TABLET BY MOUTH ON MONDAYS, WEDNESDAYS, FRIDAYS, AND SUNDAYS Teodora Medici, DO Taking Active   levothyroxine (SYNTHROID) 88 MCG tablet [id number] No TAKE 1 TABLET  BY MOUTH ON TUESDAYS, THURSDAYS AND Jac Canavan, DO Taking Active   lisinopril (ZESTRIL) 40 MG tablet [id number] No Take 1 tablet (40 mg total) by mouth daily. Gerrie Nordmann, NP Taking Active   Loteprednol Etabonate (LOTEMAX OP) [id number] No Apply 1 drop to eye at bedtime. To right eye [provider] Taking Active   Netarsudil Dimesylate (RHOPRESSA) 0.02 % SOLN [id number] No Place 1 drop into the right eye at bedtime.  Patient not taking: Reported on 07/25/2022   [provider] Not Taking Active Family Member  ondansetron (ZOFRAN) 8 MG tablet [id number] No Take 8 mg by mouth as needed. [provider] Taking Active   polyethylene glycol (MIRALAX / GLYCOLAX) 17 g packet [id number] No Take 17 g by mouth daily. [provider] Taking Active Family Member  Rivaroxaban (XARELTO) 15 MG TABS tablet [id number] No TAKE 1 TABLET EVERY DAY WITH SUPPER Theora Gianotti, NP Taking Active   sodium chloride (MURO 128) 5 % ophthalmic solution [id number] No Place 1 drop into the right eye 2 (two) times daily. [provider] Taking Active   sodium chloride 1 g tablet [id number] No Take 1 g by mouth. Takes 1/2 tablet with breakfast and lunch and 1 whole tablet with supper [provider] Taking Active   spironolactone (ALDACTONE) 25 MG tablet [id number] No Take 1 tablet by mouth once daily End, Christopher, MD Taking Active   TIMOLOL MALEATE PF OP [id number] No Place 1 drop into the right eye in the morning and at bedtime. [provider] Taking Active   vitamin B-12 (CYANOCOBALAMIN) 100 MCG tablet [id number] No Take 100 mcg by mouth daily. [provider] Taking Active Family Member            SDOH:  (Social Determinants of Health) assessments and interventions performed: Yes SDOH Interventions    Flowsheet Row Care Coordination from 03/22/2022 in [place] from  07/21/2021  in [facility]  SDOH Interventions    Food Insecurity Interventions Intervention Not Indicated Intervention Not Indicated  Housing Interventions Intervention Not Indicated Intervention Not Indicated  Transportation Interventions Intervention Not Indicated Intervention Not Indicated  Utilities Interventions Intervention Not Indicated --  Alcohol Usage Interventions Intervention Not Indicated (Score <7) --  Financial Strain Interventions Intervention Not Indicated Intervention Not Indicated  Physical Activity Interventions -- Intervention Not Indicated  Stress Interventions Intervention Not Indicated Intervention Not Indicated  Social Connections Interventions Intervention Not Indicated Intervention Not Indicated       Medication Assistance: Application for Eliquis  medication assistance program. in process.  Anticipated assistance start date TBD.  See plan of care for additional detail.  Medication Access: Within the past 30 days, how often has patient missed a dose of medication? None Is a pillbox or other method used to improve adherence? Yes  Factors that may affect medication adherence? no barriers identified Are meds synced by current pharmacy? No  Are meds delivered by current pharmacy? Yes  Does patient experience delays in picking up medications due to transportation concerns? No   [facility] Services Reviewed: Is patient disadvantaged to use [facility]?: Yes  Current Rx insurance plan: [employer] Name and location of Current pharmacy:  [facility] [address] [place] [address] Phone: [phone] Fax: [phone]  [facility] Not Mail Employee Only Now [place], [place] [address] Phone: [phone] Fax: [phone]  [facility] - [place], [place] [address] Phone: [phone] Fax: [phone]  [facility] services reviewed with patient today?: Yes  Patient requests to transfer care to [place]?: No  Reason patient declined to change pharmacies: Disadvantaged due to insurance/mail order  Compliance/Adherence/Medication fill history: Care Gaps: Covid Booster  Star-Rating Drugs: Atorvastatin 20 mg last filled on 07/21/2022 for a 90-Day supply with [facility] Lisinopril 40 mg last filled on 07/18/2022 for a 90-Day supply with [place]   Assessment/Plan  Heart Failure (Goal: manage symptoms and prevent exacerbations) -Controlled -Last ejection fraction: 40-45% (Date: Jan 2023) -HF type: HFmrEF (mildly reduced EF 41-49%) -NYHA Class: II (slight limitation of activity) -AHA HF Stage: C (Heart disease and symptoms present) -Current treatment: Amlodipine 2.5 mg daily if systolic >594 Lisinopril 40 mg daily  Spironolactone 25 mg daily  -Medications previously tried: Midodrine   -Current home BP/HR readings: Monitoring daily,   138/67  135/67  121/52  146/65  131/64  141/58   132/60 -Current home daily weights: Weighs daily, weight has been decreased due to poor appetite following dental extraction. Weight was previously 130s, now down to 124 today. Patient drinks 1/2 of a protein shake every other day.  -Recommended to continue current medication  Atrial Fibrillation (Goal: prevent stroke and major bleeding) -Controlled -CHADSVASC: 6 -Current treatment: Rate control: None Anticoagulation: Xarelto 15 mg daily  -Medications previously tried: NA -Affordability concerns with Xarelto. After review with patient's daughter, patient may qualify for patient assistance for Eliquis. Will discuss that change with Cardiology and if in agreement will begin paperwork for assistance.  Hyperlipidemia: (LDL goal < 100) -Controlled -Current treatment: Atorvastatin  20 mg daily  -Medications previously tried: NA  -Recommended to continue current medication  Osteoporosis (Goal Prevent fractures) -Controlled -Last DEXA Scan: Feb 2020   T-Score total hip: -1.4  T-Score lumbar spine: +1.4  T-Score forearm radius: -2.8 -Current treatment  Alendronate 70 mg weekly (Started Feb 2020)  Vitamin D 1000 units daily  -Medications previously tried: NA  -Recommend 1200 mg of calcium daily from dietary and supplemental sources. -DEXA recheck scheduled for March.  -Recommended to continue current medication  Malva Limes, [place] Pharmacist Practitioner  [facility] [phone]

## 2022-08-03 ENCOUNTER — Ambulatory Visit: Payer: Medicare Other

## 2022-08-03 ENCOUNTER — Ambulatory Visit: Payer: Medicare Other | Admitting: Internal Medicine

## 2022-08-03 DIAGNOSIS — R1312 Dysphagia, oropharyngeal phase: Secondary | ICD-10-CM

## 2022-08-03 DIAGNOSIS — M6281 Muscle weakness (generalized): Secondary | ICD-10-CM

## 2022-08-03 DIAGNOSIS — H1820 Unspecified corneal edema: Secondary | ICD-10-CM | POA: Diagnosis not present

## 2022-08-03 DIAGNOSIS — H401132 Primary open-angle glaucoma, bilateral, moderate stage: Secondary | ICD-10-CM | POA: Diagnosis not present

## 2022-08-03 DIAGNOSIS — R2681 Unsteadiness on feet: Secondary | ICD-10-CM | POA: Diagnosis not present

## 2022-08-03 DIAGNOSIS — R262 Difficulty in walking, not elsewhere classified: Secondary | ICD-10-CM | POA: Diagnosis not present

## 2022-08-03 NOTE — Therapy (Signed)
OUTPATIENT PHYSICAL THERAPY NEURO TREATMENT NOTE   Patient Name: Sydney Mcdaniel MRN: [id number] DOB:September 07, 1924, 89 y.o., female Today's Date: 08/03/2022  PCP: Teodora Medici, DO REFERRING PROVIDER: Teodora Medici, DO    PT End of Session - 08/03/22 0849     Visit Number 24    Number of Visits 25    Date for PT Re-Evaluation 09/19/22    Authorization Type Medicare    Authorization Time Period 06/27/22-09/19/22    Progress Note Due on Visit 30    PT Start Time 0845    PT Stop Time 0925    PT Time Calculation (min) 40 min    Equipment Utilized During Treatment Gait belt    Activity Tolerance Patient tolerated treatment well;No increased pain    Behavior During Therapy WFL for tasks assessed/performed               Past Medical History:  Diagnosis Date   Allergy    Anemia    Cardiomyopathy ([place])    a. 02/2019 Echo: EF 60-65%, no rwma, doppler parameters consistent w/ pseudonormalization. Nl RV size/fxn; b. 06/2021 Echo: EF 40-45% w/ sev basal-mid antsept HK. GrII DD. Nl RV size/fxn. Mild MR/AI.   Carotid arterial disease ([place])    a. 04/2020 Carotid U/S: 1-39% bilat ICA stenosis, <50% bilat ECA stenosis; b. 06/2021 Carotid U/S: RICA 8-52%, LICA 77-82%.   Cataract    CLL (chronic lymphocytic leukemia) (HCC)    GERD (gastroesophageal reflux disease)    Glaucoma    H/O: hysterectomy    Total   History of stress test    a. 02/2019 MV: EF 60%, no ischemia/infarct.   Hyperlipidemia    Hypertension    Hypothyroidism    Impaired fasting glucose    Lichen sclerosus    Osteoporosis    Hips   PAF (paroxysmal atrial fibrillation) ([place])    a. 06/15/2016 Event monitor: 4% afib burden; b. CHA2DS2VASc - 4-->Xarelto.   Sinus bradycardia    a. avoid AVN blocking agents.   Syncope    a. 03/2016 Echo: EF 55-60%, no rwma, mild AI/MR, nl PASP; b. 03/2016 48h Holter: no significant arrhythmias/pauses; c. 03/2016 MV: mild apical defect, likely breast attenuation, nl EF, low risk;  d. 05/2016 Event monitor: No significant arrhythmia; e. 05/2016 Event monitor: PAF (4%).   Past Surgical History:  Procedure Laterality Date   APPENDECTOMY     EYE SURGERY     Glaucoma   MULTIPLE TOOTH EXTRACTIONS N/A    2 teeth   PACEMAKER IMPLANT N/A 09/22/2021   Procedure: PACEMAKER IMPLANT;  Surgeon: Deboraha Sprang, MD;  Location: [facility];  Service: Cardiovascular;  Laterality: N/A;   TOTAL ABDOMINAL HYSTERECTOMY     Patient Active Problem List   Diagnosis Date Noted   HFrEF (heart failure with reduced ejection fraction) ([place]) 05/06/2022   Sick sinus syndrome ([place]) 05/06/2022   General weakness 02/01/2022   Arm pain, anterior, right 01/30/2022   SIADH (syndrome of inappropriate ADH production) ([place]) 07/05/2021   Aspiration pneumonia ([place]) 05/21/2021   Elevated troponin 05/20/2021   Fall 02/11/2021   Normocytic anemia 05/31/2020   Goals of care, counseling/discussion 03/08/2020   Positive direct antiglobulin test (DAT) 12/04/2019   Sinus bradycardia 04/09/2019   First degree AV block 04/09/2019   Osteoporosis 08/21/2018   Bilateral carotid artery stenosis 04/19/2018   History of syncope 10/17/2017   MCI (mild cognitive impairment) 08/12/2017   Lung nodules 07/09/2017   Orthostatic lightheadedness 04/18/2017  Paroxysmal atrial fibrillation (HCC) 04/18/2017   Hyponatremia 10/20/2016   Chronic diastolic congestive heart failure ([place]) 06/28/2016   Syncope, near 05/24/2016   Primary open-angle glaucoma, bilateral, mild stage 05/08/2016   Bladder prolapse, female, acquired 02/25/2016   Asymptomatic bacteriuria 02/25/2016   B12 deficiency 01/11/2016   Anemia 01/10/2016   CLL (chronic lymphocytic leukemia) ([place]) 01/10/2016   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 12/21/2015   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 12/21/2015   Bradycardia 12/21/2015   Lymphocytosis 06/25/2015   Impaired fasting glucose 06/22/2015   Carotid atherosclerosis  06/22/2015   Essential hypertension 12/22/2014   Hyperlipidemia 12/22/2014   Hypothyroidism 12/22/2014    ONSET DATE: 01/30/2022  REFERRING DIAG: R26.9 (ICD-10-CM) - Abnormality of gait and mobility  THERAPY DIAG:  Unsteadiness on feet  Muscle weakness (generalized)  Difficulty in walking, not elsewhere classified  Dysphagia, oropharyngeal phase  Rationale for Evaluation and Treatment Rehabilitation  SUBJECTIVE:   SUBJECTIVE STATEMENT: DTR reports no updates since last session. Pt goes to see eye doctor for FU later today.   [name]: family member DTR  PERTINENT HISTORY: [name] back in 2022 for weakness, imbalance. She returns for generalized weakness, balance issues and falls following hospitalization 01/30/22-01/31/22 for acute on chronic hyponatremia, near syncope, RUE pain and generalized weakness. Pt biggest concern is her balance. 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 Nope   FALLS: Has patient fallen in last 6 months? Yes. Number of falls 1  PATIENT GOALS: "I want more balance"    TODAY'S TREATMENT: -alternate fwd/backward oin // bars, 2 bar support 5x (includes 1x STS) -same as above with 3 step over obstacles and mat -same as first item but with half of distance over red mat, then side by side 2x4/airex beam + 180 degree turn at end (fwd AMB only) -AMB loops through // bars and around the outside 5x [facility]   *generally shows good risk aversion, problem solving, and learning of task with improving confidence    PATIENT EDUCATION: Education details: Pt educated throughout session about proper posture and technique with exercises. Improved exercise technique, movement at target joints, use of target muscles after min to mod verbal, visual, tactile cues. Goals, plan  Person educated: Patient and DTR Education method: Explanation, Demonstration, Tactile cues, and Verbal cues Education comprehension: verbalized understanding, returned demonstration, verbal cues required, tactile cues required, and needs further education   HOME EXERCISE PROGRAM: Pt to continue HEP as previously given  Instruction in use of theraputty  Access Code: [email or web address] URL: [email or web address] Date: 04/03/2022 Prepared by: Ricard Dillon  Exercises - Seated March  - 1 x daily - 5 x weekly - 3 sets - 20 reps - alternating leg hold    GOALS: Goals reviewed with patient? Yes  SHORT TERM GOALS: Target date: 09/14/2022  Patient will be independent in home exercise program to improve strength/mobility for better functional independence with ADLs. Baseline: initiated; 06/27/21: pt HEP to be advanced; 06/29/22: to be advanced, pt DTR reports pt feels comfortable is indep Goal status: MET  LONG TERM  GOALS: Target date: 10/26/2022  Patient will increase FOTO score to equal to or greater than  47   to demonstrate statistically significant improvement in mobility and quality of life.  Baseline: 38 05/25/22: 42; 1/2: deferred; 06/29/22: 44; 07/20/22: 39 Goal status: PARTIALLY MET  2.  Patient (> 8 years old) will complete five times sit to stand test in < 15 seconds indicating an increased LE strength and improved balance. Baseline: 28 sec use of UE 05/25/22: 19.57 sec use of UE's on knees; 1/2: deferred; `/4/24: 23 sec hands but hands-free; 1/25: 19 sec hands-free Goal status: IN PROGRESS  3. Patient will increase  10 meter walk test to >1.69ms as to improve gait speed for better community ambulation and to reduce fall risk.  Baseline: 0.45 m/s w SPC, close CGA 05/25/22: 0.56 m/s w SPC, close CGA; 1/2: deferred; 1/4: 0.45 m/s with SPC close CGA ; 1/25: 0.63 m/s with SPC Goal status: IN PROGRESS  4.   Patient will reduce timed up and go to <15 seconds to reduce fall risk and demonstrate improved transfer/gait ability. [facility]: 29.19 sec with SPC; 06/27/22: deferred; 06/29/22: 34 seconds with SPC and close CGA; 1/25: 26 sec with SPC, close CGA Goal status: IN PROGRESS  5.   Patient will increase Berg Balance score by > 6 points to demonstrate decreased fall risk during functional activities. Baseline: to be completed next 1-2 sessions; 10/18: 32/56 05/25/22:  37/56; 06/27/22: deferred; 06/29/22: 38/56 ; 1/25: 38/40 Goal status: IN PROGRESS   ASSESSMENT:  CLINICAL IMPRESSION: Continued with current plan of care as laid out in evaluation and recent prior sessions. All interventions tolerated as planned. Interventions aimed at targeting static and dynamic postural control, anticipatory postural control, multilevel righting strategies, single and multiplanar movements, initiation/cessation of mobility, and full body proprioception. Also incorporated components for maximizing pt's vestibular and visual systems for full functional reintegration. Recovery intervals given as needed based on signs of exertion and/or pt request. Pt remains motivated to advance progress toward goals in order to maximize independence and safety at home. Pt requires high level assistance and cuing for completion of exercises in order to provide adequate level of stimulation and perturbation. Author allows pt as much opportunity as possible to perform independent righting strategies, only providing physical assist when pt is unable correct LOB independently. The patient's therapy prognosis indicates continued potential  for improvement, anticipate that future progress is attainable in a reasonable/predictable timeframe. Maximum improvement is within reach. Pt will continue to benefit from skilled PT services to address deficits and impairment identified in evaluation in order to maximize independence and safety in basic mobility required for performance of ADL, IADL, and leisure.      OBJECTIVE IMPAIRMENTS Abnormal gait, decreased activity tolerance, decreased balance, decreased cognition, decreased endurance, decreased knowledge of use of DME, decreased mobility, difficulty walking, decreased strength, impaired vision/preception, improper body mechanics, postural dysfunction, and pain.   ACTIVITY LIMITATIONS squatting, stairs, transfers, and locomotion level  PARTICIPATION LIMITATIONS: meal prep, cleaning, laundry, shopping, community activity, and yard work  PERSONAL FACTORS Age, Fitness, Past/current experiences, Sex, and 3+ comorbidities: Per chart PMH significant for hypertension, hyperlipidemia, chronic lymphocytic leukemia, chronic hyponatremia and SIADH, generalized weakness, hypothyroidism, carotid atherosclerosis, lymphocytosis, neoplasm of uncertain behavior of skin of ear and nose, B12 deficiency, bladder prolapse, glaucoma, Chronic diastolic congestive heart failure, paroxysmal atria fibrillation, lung nodules, mild cognitive impairment, osteoporosis, first degree AV block, normocytic anemia, falls,   are also affecting patient's functional  outcome.   REHAB POTENTIAL: Good  CLINICAL DECISION MAKING: Evolving/moderate complexity  EVALUATION COMPLEXITY: Moderate  PLAN: PT FREQUENCY: 2x/week  PT DURATION: 12 weeks  PLANNED INTERVENTIONS: Therapeutic exercises, Therapeutic activity, Neuromuscular re-education, Balance training, Gait training, Patient/Family education, Self Care, Joint mobilization, Stair training, Vestibular training, Canalith repositioning, Orthotic/Fit training, DME instructions,  Wheelchair mobility training, Spinal mobilization, Cryotherapy, Moist heat, scar mobilization, Splintting, Taping, Ultrasound, Parrafin, Biofeedback, Manual therapy, and Re-evaluation  PLAN FOR NEXT SESSION:  Strength, balance, SPC gait training, continue plan, LLE balance   9:24 AM, 08/03/22 Etta Grandchild, PT, DPT Physical Therapist - [place] [phone]    Etta Grandchild PT  08/03/22, 8:55 AM

## 2022-08-07 ENCOUNTER — Telehealth: Payer: Self-pay | Admitting: Internal Medicine

## 2022-08-07 ENCOUNTER — Telehealth: Payer: Self-pay

## 2022-08-07 NOTE — Telephone Encounter (Signed)
Outreach made to Pt's daughter [name].  She states Pt has been doing well.  No complaints of weakness or dizziness.  She is going to PT.  Per her daughter, she has not made any complaints.  Daughter states she will continue to monitor and if Pt has any complaints she will know to call.  Pt has follow up with Dr. Caryl Comes April 2024.

## 2022-08-07 NOTE — Telephone Encounter (Signed)
Pt c/o medication issue:  1. Name of Medication:   Rivaroxaban (XARELTO) 15 MG TABS tablet    2. How are you currently taking this medication (dosage and times per day)?   TAKE 1 TABLET EVERY DAY WITH SUPPER    3. Are you having a reaction (difficulty breathing--STAT)? No  4. What is your medication issue? Office calling in regards to pt possibly being changed to Eliquis 5 MG twice daily. Pt is also interested in getting Pt Assistance  as well. Office would like a callback regarding this matter as well as the pt. Please advise

## 2022-08-07 NOTE — Telephone Encounter (Signed)
Attempted to contact the number provided Unable to leave message as mailbox is full

## 2022-08-07 NOTE — Telephone Encounter (Signed)
Alert received from [employer].  Pt with history of PAF-now persistent since June 23, 2022.

## 2022-08-08 ENCOUNTER — Ambulatory Visit: Payer: Medicare Other

## 2022-08-08 DIAGNOSIS — M6281 Muscle weakness (generalized): Secondary | ICD-10-CM

## 2022-08-08 DIAGNOSIS — R1312 Dysphagia, oropharyngeal phase: Secondary | ICD-10-CM | POA: Diagnosis not present

## 2022-08-08 DIAGNOSIS — R2681 Unsteadiness on feet: Secondary | ICD-10-CM | POA: Diagnosis not present

## 2022-08-08 DIAGNOSIS — R262 Difficulty in walking, not elsewhere classified: Secondary | ICD-10-CM

## 2022-08-08 DIAGNOSIS — H401132 Primary open-angle glaucoma, bilateral, moderate stage: Secondary | ICD-10-CM | POA: Diagnosis not present

## 2022-08-08 DIAGNOSIS — H18221 Idiopathic corneal edema, right eye: Secondary | ICD-10-CM | POA: Diagnosis not present

## 2022-08-08 NOTE — Therapy (Signed)
OUTPATIENT PHYSICAL THERAPY NEURO TREATMENT NOTE   Patient Name: Sydney Mcdaniel MRN: [id number] DOB:22-Sep-1924, 89 y.o., female Today's Date: 08/08/2022  PCP: Teodora Medici, DO REFERRING PROVIDER: Teodora Medici, DO    PT End of Session - 08/08/22 1008     Visit Number 25    Number of Visits 25    Date for PT Re-Evaluation 09/19/22    Authorization Type Medicare    Authorization Time Period 06/27/22-09/19/22    Progress Note Due on Visit 30    PT Start Time 0847    PT Stop Time 0930    PT Time Calculation (min) 43 min    Equipment Utilized During Treatment Gait belt    Activity Tolerance Patient tolerated treatment well;No increased pain    Behavior During Therapy WFL for tasks assessed/performed               Past Medical History:  Diagnosis Date   Allergy    Anemia    Cardiomyopathy ([place])    a. 02/2019 Echo: EF 60-65%, no rwma, doppler parameters consistent w/ pseudonormalization. Nl RV size/fxn; b. 06/2021 Echo: EF 40-45% w/ sev basal-mid antsept HK. GrII DD. Nl RV size/fxn. Mild MR/AI.   Carotid arterial disease ([place])    a. 04/2020 Carotid U/S: 1-39% bilat ICA stenosis, <50% bilat ECA stenosis; b. 06/2021 Carotid U/S: RICA 123456, LICA 123456.   Cataract    CLL (chronic lymphocytic leukemia) (HCC)    GERD (gastroesophageal reflux disease)    Glaucoma    H/O: hysterectomy    Total   History of stress test    a. 02/2019 MV: EF 60%, no ischemia/infarct.   Hyperlipidemia    Hypertension    Hypothyroidism    Impaired fasting glucose    Lichen sclerosus    Osteoporosis    Hips   PAF (paroxysmal atrial fibrillation) ([place])    a. 06/15/2016 Event monitor: 4% afib burden; b. CHA2DS2VASc - 4-->Xarelto.   Sinus bradycardia    a. avoid AVN blocking agents.   Syncope    a. 03/2016 Echo: EF 55-60%, no rwma, mild AI/MR, nl PASP; b. 03/2016 48h Holter: no significant arrhythmias/pauses; c. 03/2016 MV: mild apical defect, likely breast attenuation, nl EF, low risk;  d. 05/2016 Event monitor: No significant arrhythmia; e. 05/2016 Event monitor: PAF (4%).   Past Surgical History:  Procedure Laterality Date   APPENDECTOMY     EYE SURGERY     Glaucoma   MULTIPLE TOOTH EXTRACTIONS N/A    2 teeth   PACEMAKER IMPLANT N/A 09/22/2021   Procedure: PACEMAKER IMPLANT;  Surgeon: Deboraha Sprang, MD;  Location: [facility];  Service: Cardiovascular;  Laterality: N/A;   TOTAL ABDOMINAL HYSTERECTOMY     Patient Active Problem List   Diagnosis Date Noted   HFrEF (heart failure with reduced ejection fraction) ([place]) 05/06/2022   Sick sinus syndrome ([place]) 05/06/2022   General weakness 02/01/2022   Arm pain, anterior, right 01/30/2022   SIADH (syndrome of inappropriate ADH production) ([place]) 07/05/2021   Aspiration pneumonia ([place]) 05/21/2021   Elevated troponin 05/20/2021   Fall 02/11/2021   Normocytic anemia 05/31/2020   Goals of care, counseling/discussion 03/08/2020   Positive direct antiglobulin test (DAT) 12/04/2019   Sinus bradycardia 04/09/2019   First degree AV block 04/09/2019   Osteoporosis 08/21/2018   Bilateral carotid artery stenosis 04/19/2018   History of syncope 10/17/2017   MCI (mild cognitive impairment) 08/12/2017   Lung nodules 07/09/2017   Orthostatic lightheadedness 04/18/2017  Paroxysmal atrial fibrillation (HCC) 04/18/2017   Hyponatremia 10/20/2016   Chronic diastolic congestive heart failure ([place]) 06/28/2016   Syncope, near 05/24/2016   Primary open-angle glaucoma, bilateral, mild stage 05/08/2016   Bladder prolapse, female, acquired 02/25/2016   Asymptomatic bacteriuria 02/25/2016   B12 deficiency 01/11/2016   Anemia 01/10/2016   CLL (chronic lymphocytic leukemia) ([place]) 01/10/2016   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 12/21/2015   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 12/21/2015   Bradycardia 12/21/2015   Lymphocytosis 06/25/2015   Impaired fasting glucose 06/22/2015   Carotid atherosclerosis  06/22/2015   Essential hypertension 12/22/2014   Hyperlipidemia 12/22/2014   Hypothyroidism 12/22/2014    ONSET DATE: 01/30/2022  REFERRING DIAG: R26.9 (ICD-10-CM) - Abnormality of gait and mobility  THERAPY DIAG:  Muscle weakness (generalized)  Unsteadiness on feet  Difficulty in walking, not elsewhere classified  Rationale for Evaluation and Treatment Rehabilitation  SUBJECTIVE:   SUBJECTIVE STATEMENT: Pt reports no updates today, no pain or stumbles/falls. HEP going well.  [name]: family member DTR  PERTINENT HISTORY: [name] back in 2022 for weakness, imbalance. She returns for generalized weakness, balance issues and falls following hospitalization 01/30/22-01/31/22 for acute on chronic hyponatremia, near syncope, RUE pain and generalized weakness. Pt biggest concern is her balance. 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 Nope   FALLS: Has patient fallen in last 6 months? Yes. Number of falls 1  PATIENT GOALS: "I want more balance"    TODAY'S TREATMENT: Gait belt donned and CGA provided throughout unless specified otherwise  TherEx: STS 12x hands-free Staggered STS 8x each LE. Pt rates difficult. Has trouble with eccentric control into chair Comments: cuing intermittently throughout for technique Seated: 5# AW each LE LAQ 15x each LE  March 15x each LE  Gait with [phone] for LE endurance with 5# AW each LE 3x148 ft  with one seated rest break. Additional cuing proximity to RW, upright posture, step-length and heel-toe sequencing.  NMR: Alternating step up and down 6" step with BUE support x multiple reps, cuing for foot clearance Alternating step tap initially BUE>UUE support to intermittent UE support 6" step x multiple reps    PATIENT EDUCATION: Education details: Pt educated throughout session about proper posture and technique with exercises. Improved exercise technique, movement at target joints, use of target muscles after min to mod verbal, visual, tactile cues.   Person educated: Patient and DTR Education method: Explanation, Demonstration, Tactile cues, and Verbal cues Education comprehension: verbalized understanding, returned demonstration, verbal cues required, tactile cues required, and needs further education   HOME EXERCISE PROGRAM: Pt to continue HEP as previously given  Instruction in use of theraputty  Access Code: [email or web address] URL: [email or web address] Date: 04/03/2022 Prepared by: Ricard Dillon  Exercises - Seated March  - 1 x daily - 5 x weekly - 3 sets - 20 reps - alternating leg hold    GOALS: Goals reviewed with patient? Yes  SHORT TERM GOALS: Target date: 09/19/2022  Patient will be independent in home exercise program to improve strength/mobility for better functional independence with  ADLs. Baseline: initiated; 06/27/21: pt HEP to be advanced; 06/29/22: to be advanced, pt DTR reports pt feels comfortable is indep Goal status: MET  LONG TERM GOALS: Target date: 10/31/2022  Patient will increase FOTO score to equal to or greater than  47   to demonstrate statistically significant improvement in mobility and quality of life.  Baseline: 38 05/25/22: 42; 1/2: deferred; 06/29/22: 44; 07/20/22: 39 Goal status: PARTIALLY MET  2.  Patient (> 74 years old) will complete five times sit to stand test in < 15 seconds indicating an increased LE strength and improved  balance. Baseline: 28 sec use of UE 05/25/22: 19.57 sec use of UE's on knees; 1/2: deferred; `/4/24: 23 sec hands but hands-free; 1/25: 19 sec hands-free Goal status: IN PROGRESS  3. Patient will increase 10 meter walk test to >1.76ms as to improve gait speed for better community ambulation and to reduce fall risk.  Baseline: 0.45 m/s w SPC, close CGA 05/25/22: 0.56 m/s w SPC, close CGA; 1/2: deferred; 1/4: 0.45 m/s with SPC close CGA ; 1/25: 0.63 m/s with SPC Goal status: IN PROGRESS  4.   Patient will reduce timed up and go to <15 seconds to reduce fall risk and demonstrate improved transfer/gait ability. [facility] 29.19 sec with SPC; 06/27/22: deferred; 06/29/22: 34 seconds with SPC and close CGA; 1/25: 26 sec with SPC, close CGA Goal status: IN PROGRESS  5.   Patient will increase Berg Balance score by > 6 points to demonstrate decreased fall risk during functional activities. Baseline: to be completed next 1-2 sessions; 10/18: 32/56 05/25/22:  37/56; 06/27/22: deferred; 06/29/22: 38/56 ; 1/25: 38/40 Goal status: IN PROGRESS   ASSESSMENT:  CLINICAL IMPRESSION: Pt advancing dynamic step-tap activities by completing intervention with intermittent UE support where pt initially started with BUE support. Pt balance most likely partially impacted by FOF. Pt also participated in ambulation endurance intervention with AW donned. Pt fatigued and required rest break. Will continue to focus on improving this deficit. Pt will continue to benefit from skilled PT services to address deficits and impairment identified in evaluation in order to maximize independence and safety in basic mobility required for performance of ADL, IADL, and leisure.    OBJECTIVE IMPAIRMENTS Abnormal gait, decreased activity tolerance, decreased balance, decreased cognition, decreased endurance, decreased knowledge of use of DME, decreased mobility, difficulty walking, decreased strength, impaired  vision/preception, improper body mechanics, postural dysfunction, and pain.   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   REHAB POTENTIAL: Good  CLINICAL DECISION MAKING: Evolving/moderate complexity  EVALUATION COMPLEXITY: Moderate  PLAN: PT FREQUENCY: 2x/week  PT DURATION: 12 weeks  PLANNED INTERVENTIONS: Therapeutic exercises, Therapeutic activity, Neuromuscular re-education, Balance training, Gait training, Patient/Family education, Self Care, Joint mobilization, Stair training, Vestibular training, Canalith repositioning, Orthotic/Fit training, DME instructions, Wheelchair mobility training, Spinal mobilization, Cryotherapy, Moist heat, scar mobilization, Splintting, Taping, Ultrasound, Parrafin, Biofeedback, Manual therapy, and Re-evaluation  PLAN FOR NEXT SESSION:  Strength, balance, SPC  gait training, continue plan, LLE balance   10:16 AM, 08/08/22  Physical Therapist - [facility]  08/08/22, 10:16 AM

## 2022-08-09 NOTE — Telephone Encounter (Signed)
Attempted to contact Tasha at the number provided.  Unable to leave voicemail.  Spoke with Dr. Vianne Bulls nurse. She stated there were notes in pt's chart from their office pharmacist requesting to switch pt from xarelto to eliquis in order to apply for pt assistance.  Will forward to Dr. Saunders Revel

## 2022-08-10 ENCOUNTER — Ambulatory Visit: Payer: Medicare Other

## 2022-08-10 DIAGNOSIS — R262 Difficulty in walking, not elsewhere classified: Secondary | ICD-10-CM

## 2022-08-10 DIAGNOSIS — R2681 Unsteadiness on feet: Secondary | ICD-10-CM

## 2022-08-10 DIAGNOSIS — M6281 Muscle weakness (generalized): Secondary | ICD-10-CM | POA: Diagnosis not present

## 2022-08-10 DIAGNOSIS — R1312 Dysphagia, oropharyngeal phase: Secondary | ICD-10-CM | POA: Diagnosis not present

## 2022-08-10 NOTE — Telephone Encounter (Signed)
I think it is reasonable to consider a switch from rivaroxaban to apixaban.  However, I recommend that we help her apply for assistance first, as it does not make sense to switch her to apixaban and then find out that she does not qualify for assistance.  Of note, appropriate dose for her age and weight would be apixaban 2.5 mg twice daily.  Nelva Bush, MD [facility]

## 2022-08-10 NOTE — Therapy (Signed)
OUTPATIENT PHYSICAL THERAPY NEURO TREATMENT NOTE   Patient Name: Sydney Mcdaniel MRN: [id number] DOB:11-30-1924, 89 y.o., female Today's Date: 08/10/2022  PCP: Teodora Medici, DO REFERRING PROVIDER: Teodora Medici, DO    PT End of Session - 08/10/22 1308     Visit Number 26    Number of Visits 37   **visit number corrected to reflect recert from [id number]   Date for PT Re-Evaluation 09/19/22    Authorization Type Medicare    Authorization Time Period 06/27/22-09/19/22    Progress Note Due on Visit 30    PT Start Time 0846    PT Stop Time 0927    PT Time Calculation (min) 41 min    Equipment Utilized During Treatment Gait belt    Activity Tolerance Patient tolerated treatment well;No increased pain    Behavior During Therapy WFL for tasks assessed/performed               Past Medical History:  Diagnosis Date   Allergy    Anemia    Cardiomyopathy ([place])    a. 02/2019 Echo: EF 60-65%, no rwma, doppler parameters consistent w/ pseudonormalization. Nl RV size/fxn; b. 06/2021 Echo: EF 40-45% w/ sev basal-mid antsept HK. GrII DD. Nl RV size/fxn. Mild MR/AI.   Carotid arterial disease ([place])    a. 04/2020 Carotid U/S: 1-39% bilat ICA stenosis, <50% bilat ECA stenosis; b. 06/2021 Carotid U/S: RICA 123456, LICA 123456.   Cataract    CLL (chronic lymphocytic leukemia) (HCC)    GERD (gastroesophageal reflux disease)    Glaucoma    H/O: hysterectomy    Total   History of stress test    a. 02/2019 MV: EF 60%, no ischemia/infarct.   Hyperlipidemia    Hypertension    Hypothyroidism    Impaired fasting glucose    Lichen sclerosus    Osteoporosis    Hips   PAF (paroxysmal atrial fibrillation) ([place])    a. 06/15/2016 Event monitor: 4% afib burden; b. CHA2DS2VASc - 4-->Xarelto.   Sinus bradycardia    a. avoid AVN blocking agents.   Syncope    a. 03/2016 Echo: EF 55-60%, no rwma, mild AI/MR, nl PASP; b. 03/2016 48h Holter: no significant arrhythmias/pauses; c. 03/2016 MV: mild  apical defect, likely breast attenuation, nl EF, low risk; d. 05/2016 Event monitor: No significant arrhythmia; e. 05/2016 Event monitor: PAF (4%).   Past Surgical History:  Procedure Laterality Date   APPENDECTOMY     EYE SURGERY     Glaucoma   MULTIPLE TOOTH EXTRACTIONS N/A    2 teeth   PACEMAKER IMPLANT N/A 09/22/2021   Procedure: PACEMAKER IMPLANT;  Surgeon: Deboraha Sprang, MD;  Location: [facility];  Service: Cardiovascular;  Laterality: N/A;   TOTAL ABDOMINAL HYSTERECTOMY     Patient Active Problem List   Diagnosis Date Noted   HFrEF (heart failure with reduced ejection fraction) ([place]) 05/06/2022   Sick sinus syndrome ([place]) 05/06/2022   General weakness 02/01/2022   Arm pain, anterior, right 01/30/2022   SIADH (syndrome of inappropriate ADH production) ([place]) 07/05/2021   Aspiration pneumonia ([place]) 05/21/2021   Elevated troponin 05/20/2021   Fall 02/11/2021   Normocytic anemia 05/31/2020   Goals of care, counseling/discussion 03/08/2020   Positive direct antiglobulin test (DAT) 12/04/2019   Sinus bradycardia 04/09/2019   First degree AV block 04/09/2019   Osteoporosis 08/21/2018   Bilateral carotid artery stenosis 04/19/2018   History of syncope 10/17/2017   MCI (mild cognitive impairment) 08/12/2017   Lung  nodules 07/09/2017   Orthostatic lightheadedness 04/18/2017   Paroxysmal atrial fibrillation (HCC) 04/18/2017   Hyponatremia 10/20/2016   Chronic diastolic congestive heart failure ([place]) 06/28/2016   Syncope, near 05/24/2016   Primary open-angle glaucoma, bilateral, mild stage 05/08/2016   Bladder prolapse, female, acquired 02/25/2016   Asymptomatic bacteriuria 02/25/2016   B12 deficiency 01/11/2016   Anemia 01/10/2016   CLL (chronic lymphocytic leukemia) ([place]) 01/10/2016   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 12/21/2015   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 12/21/2015   Bradycardia 12/21/2015   Lymphocytosis 06/25/2015   Impaired  fasting glucose 06/22/2015   Carotid atherosclerosis 06/22/2015   Essential hypertension 12/22/2014   Hyperlipidemia 12/22/2014   Hypothyroidism 12/22/2014    ONSET DATE: 01/30/2022  REFERRING DIAG: R26.9 (ICD-10-CM) - Abnormality of gait and mobility  THERAPY DIAG:  Unsteadiness on feet  Difficulty in walking, not elsewhere classified  Muscle weakness (generalized)  Rationale for Evaluation and Treatment Rehabilitation  SUBJECTIVE:   SUBJECTIVE STATEMENT: Pt reports no pain or stumbles/falls. HEP going well. No other updates. She says eye medications are working pt able to see out of both eyes.  [name]: family member DTR  PERTINENT HISTORY: [name] back in 2022 for weakness, imbalance. She returns for generalized weakness, balance issues and falls following hospitalization 01/30/22-01/31/22 for acute on chronic hyponatremia, near syncope, RUE pain and generalized weakness. Pt biggest concern is her balance. 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 Nope   FALLS: Has patient fallen in last 6 months? Yes. Number of falls 1  PATIENT GOALS: "I want more balance"    TODAY'S TREATMENT: Gait belt donned and CGA provided throughout unless specified otherwise  NMR: Alternating step tap onto 6" step with BUE support x multiple reps, cuing for foot clearance, decreasing UE support until attempting with no UE support One foot on  floor one on 6" step hold 30 sec each LE position x multiple reps with decreasing UE support - with cog dual task with last rep each LE SLB with decreasing UE support - able to sustain on LLE with no UE for several seconds - pt typically has greatest difficulty on LLE  Tandem stance with decreasing UE support x multiple reps of 30 sec   Standing high knee march in place with intermittent UE support 2x10 alt LE  Orange hurdle FWD/BCKWD and LTL stepping with decreasing levels of UE support, cuing for increased step-length throughout   PATIENT EDUCATION: Education details: Pt educated throughout session about proper posture and technique with exercises. Improved exercise technique, movement at target joints, use of target muscles after min to mod verbal, visual, tactile cues.   Person educated: Patient and DTR Education method: Explanation, Demonstration, Tactile cues, and Verbal cues Education comprehension: verbalized understanding, returned demonstration, verbal cues required, tactile cues required, and needs further education   HOME EXERCISE PROGRAM: Pt to continue HEP as previously given  Instruction in use of theraputty  Access Code: [email or web address] URL: [email or web address] Date: 04/03/2022 Prepared by: Ricard Dillon  Exercises - Seated March  - 1 x daily -  5 x weekly - 3 sets - 20 reps - alternating leg hold    GOALS: Goals reviewed with patient? Yes  SHORT TERM GOALS: Target date: 09/21/2022  Patient will be independent in home exercise program to improve strength/mobility for better functional independence with ADLs. Baseline: initiated; 06/27/21: pt HEP to be advanced; 06/29/22: to be advanced, pt DTR reports pt feels comfortable is indep Goal status: MET  LONG TERM GOALS: Target date: 11/02/2022  Patient will increase FOTO score to equal to or greater than  47   to demonstrate statistically significant improvement in mobility and quality of life.  Baseline:  38 05/25/22: 42; 1/2: deferred; 06/29/22: 44; 07/20/22: 39 Goal status: PARTIALLY MET  2.  Patient (> 30 years old) will complete five times sit to stand test in < 15 seconds indicating an increased LE strength and improved balance. Baseline: 28 sec use of UE 05/25/22: 19.57 sec use of UE's on knees; 1/2: deferred; `/4/24: 23 sec hands but hands-free; 1/25: 19 sec hands-free Goal status: IN PROGRESS  3. Patient will increase 10 meter walk test to >1.53ms as to improve gait speed for better community ambulation and to reduce fall risk.  Baseline: 0.45 m/s w SPC, close CGA 05/25/22: 0.56 m/s w SPC, close CGA; 1/2: deferred; 1/4: 0.45 m/s with SPC close CGA ; 1/25: 0.63 m/s with SPC Goal status: IN PROGRESS  4.   Patient will reduce timed up and go to <15 seconds to reduce fall risk and demonstrate improved transfer/gait ability. [facility]: 29.19 sec with SPC; 06/27/22: deferred; 06/29/22: 34 seconds with SPC and close CGA; 1/25: 26 sec with SPC, close CGA Goal status: IN PROGRESS  5.   Patient will increase Berg Balance score by > 6 points to demonstrate decreased fall risk during functional activities. Baseline: to be completed next 1-2 sessions; 10/18: 32/56 05/25/22:  37/56; 06/27/22: deferred; 06/29/22: 38/56 ; 1/25: 38/40 Goal status: IN PROGRESS   ASSESSMENT:  CLINICAL IMPRESSION: Pt shows progress today by sustaining several seconds of SLB on LLE. Pt typically more challenged with balance on this side, and has exhibited difficulty with progression of one foot on floor one on 6" step where LLE is primary stance leg. While pt shows progress, she had difficulty performing orange hurdle exercise, difficulty with consistently taking long enough steps for sufficient foot clearance of trail foot. Pt will continue to benefit from skilled PT services to address deficits and impairment identified in evaluation in order to maximize independence and safety in basic mobility  required for performance of ADL, IADL, and leisure.    OBJECTIVE IMPAIRMENTS Abnormal gait, decreased activity tolerance, decreased balance, decreased cognition, decreased endurance, decreased knowledge of use of DME, decreased mobility, difficulty walking, decreased strength, impaired vision/preception, improper body mechanics, postural dysfunction, and pain.   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   REHAB POTENTIAL: Good  CLINICAL DECISION MAKING: Evolving/moderate complexity  EVALUATION COMPLEXITY: Moderate  PLAN: PT FREQUENCY: 2x/week  PT DURATION: 12 weeks  PLANNED INTERVENTIONS: Therapeutic exercises, Therapeutic activity, Neuromuscular re-education, Balance training, Gait training, Patient/Family education, Self Care, Joint mobilization, Stair training, Vestibular training, Canalith repositioning, Orthotic/Fit training, DME instructions, Wheelchair mobility training, Spinal mobilization, Cryotherapy, Moist heat, scar mobilization, Splintting, Taping, Ultrasound, Parrafin, Biofeedback, Manual therapy, and Re-evaluation  PLAN FOR NEXT SESSION:  Strength, balance, SPC gait training, continue plan, LLE balance   1:17 PM, 08/10/22  Physical  Therapist - [facility] [phone]    Zollie Pee PT  08/10/22, 1:17 PM

## 2022-08-10 NOTE — Telephone Encounter (Signed)
Copied from [place]. Topic: General - Call Back - No Documentation >> Aug 10, 2022  1:43 PM Sabas Sous wrote: Reason for CRM: Elmo Putt from Dr. Darnelle Bos office (Cardiology) called requesting to speak to [place] from the pharmacy, please advise

## 2022-08-10 NOTE — Telephone Encounter (Signed)
Left message to call back  

## 2022-08-11 ENCOUNTER — Ambulatory Visit: Payer: Self-pay | Admitting: *Deleted

## 2022-08-11 ENCOUNTER — Telehealth: Payer: Self-pay

## 2022-08-11 NOTE — Progress Notes (Signed)
Care Coordination Pharmacy Assistant   Name: ENCARNACION BURRI  MRN: [id number] DOB: [birth year]  Reason for Encounter:   Contacted Dr. Darnelle Bos Office per CPP to see if Dr. Saunders Revel is okay with switching patient from [facility] to Eliquis. Patient stated that the Xarelto is very expensive and wanted to see if she could make the switch and see if she qualifies for patient assistance with Elquis. I also wanted to verify if the Cardiologist would assist with the patient assistance process or did the want [place] to start the application and they finish it.  I received a message that the RN returned my call. I called back but the representative stated she would send a message over to the nurses as Lars Mage wasn't in the office today so another nurse would call me back. I did instruct to have them call my direct number at [phone].  I received a call back from [place] stating that Dr. Saunders Revel is agreeable to switching the patient to Eliquis 2.5 mg twice daily. Per Floreen Comber Dr. End would like the patient to be approved for patient assistance prior to making the switch. I informed Floreen Comber that I would start the application and have it mailed to to the patient for the patient to complete her portion of the application and she can bring it to their office for Dr. Saunders Revel to complete.  PAP started for Eliquis 2.5 mg twice daily via [employer] patient assistance program. PAP will be mailed to patient home address on file.   Medications: Outpatient Encounter Medications as of 08/11/2022  Medication Sig   acetaminophen (TYLENOL) 500 MG tablet Take 500 mg by mouth every 8 (eight) hours as needed for moderate pain.   alendronate (FOSAMAX) 70 MG tablet TAKE 1 TABLET BY MOUTH EVERY FRIDAY ON AN EMPTY STOMACH WITH A FULL GLASS OF WATER   amLODipine (NORVASC) 5 MG tablet Take 0.5 tablets (2.5 mg total) by mouth daily. Hold for a blood pressure less than [id number] systolic (top number)   atorvastatin (LIPITOR) 20 MG tablet TAKE 1 TABLET BY MOUTH  ONCE DAILY IN THE EVENING   Cholecalciferol 25 MCG (1000 UT) tablet Take by mouth.   ferrous sulfate 325 (65 FE) MG tablet Take 2 tablets (650 mg total) by mouth daily with breakfast.   LATANOPROST OP Place 1 drop into the right eye at bedtime.   levothyroxine (EUTHYROX) 75 MCG tablet TAKE ONE TABLET BY MOUTH ON MONDAYS, WEDNESDAYS, FRIDAYS, AND SUNDAYS   levothyroxine (SYNTHROID) 88 MCG tablet TAKE 1 TABLET BY MOUTH ON TUESDAYS, THURSDAYS AND SATURDAYS   lisinopril (ZESTRIL) 40 MG tablet Take 1 tablet (40 mg total) by mouth daily.   ondansetron (ZOFRAN) 8 MG tablet Take 8 mg by mouth as needed.   polyethylene glycol (MIRALAX / GLYCOLAX) 17 g packet Take 17 g by mouth daily.   prednisoLONE acetate (PRED FORTE) 1 % ophthalmic suspension Place 1 drop into the right eye in the morning, at noon, and at bedtime.   Rivaroxaban (XARELTO) 15 MG TABS tablet TAKE 1 TABLET EVERY DAY WITH SUPPER   sodium chloride (MURO 128) 5 % ophthalmic solution Place 1 drop into the right eye in the morning, at noon, and at bedtime.   sodium chloride 1 g tablet Take 1 g by mouth. Takes 1/2 tablet with breakfast, 1/2 tablet with lunch, full tablet at supper   spironolactone (ALDACTONE) 25 MG tablet Take 1 tablet by mouth once daily   TIMOLOL MALEATE PF OP Place 1 drop into the  right eye in the morning and at bedtime.   vitamin B-12 (CYANOCOBALAMIN) 100 MCG tablet Take 100 mcg by mouth daily.   No facility-administered encounter medications on file as of 08/11/2022.   Lynann Bologna, CPA/CMA Clinical Pharmacist Assistant Phone: [phone]

## 2022-08-11 NOTE — Patient Instructions (Signed)
Visit Information  Thank you for taking time to visit with me today. Please don't hesitate to contact me if I can be of assistance to you before our next scheduled telephone appointment.  Following are the goals we discussed today:  Continue monitoring blood pressure and managing medications according to parameters.   Our next appointment is by telephone on 3/20  Please call the care guide team at [phone] if you need to cancel or reschedule your appointment.   Please call the Suicide and Crisis Lifeline: 988 call the [place] National Suicide Prevention Lifeline: 856-486-3611 or TTY: 570-096-5589 TTY 949-386-4920) to talk to a trained counselor call 1-800-273-TALK (toll free, 24 hour hotline) call 911 if you are experiencing a Mental Health or [place] or need someone to talk to.  Patient verbalizes understanding of instructions and care plan provided today and agrees to view in [place]. Active MyChart status and patient understanding of how to access instructions and care plan via MyChart confirmed with patient.     The patient has been provided with contact information for the care management team and has been advised to call with any health related questions or concerns.   Valente David, RN, MSN, [place] Care Management Care Management Coordinator [phone]

## 2022-08-11 NOTE — Patient Outreach (Signed)
  Care Coordination   Follow Up Visit Note   08/11/2022 Name: Sydney Mcdaniel MRN: [id number] DOB: 09-06-1924  Sydney Mcdaniel is a 89 y.o. year old female who sees Teodora Medici, DO for primary care. I spoke with [name], daughter of Sydney Mcdaniel by phone today.  What matters to the patients health and wellness today?  Getting assistance for cost of Xarelto and recent eye problems.     Goals Addressed             This Visit's Progress    RNCM: Effective Management of health and well being   On track    Care Coordination Interventions: Evaluation of current treatment plan related to hyponatremia and hypertension and patient's adherence to plan as established by provider Advised patient to call the office for changes in conditions, new questions or concerns Provided education to patient re: safety precautions and falls prevention.  Daughter feels she is back to baseline if not better with outpatient PT Reviewed medications with patient and discussed adherence and affordability.  Parameters for Amlodipine are 1/2 tablet if SBP greater then 140, does not take if less then 140 to prevent hypotension. This has been working well.  Waiting to hear from [facility] regarding cost of Xarelto Reviewed scheduled/upcoming provider appointments including PCP on 5/14 Discussed plans with patient for ongoing care management follow up and provided patient with direct contact information for care management team Advised patient to discuss changes in vision with provider History of Glaucoma, will see specialist on 2/19           SDOH assessments and interventions completed:  No     Care Coordination Interventions:  Yes, provided   Follow up plan: Follow up call scheduled for 3/20    Encounter Outcome:  Pt. Visit Completed   Valente David, RN, MSN, [place] Care Management Care Management Coordinator [phone]

## 2022-08-11 NOTE — Telephone Encounter (Signed)
Spoke with Philis Nettle and informed her of Dr. Darnelle Bos recommendation for the apixaban 2.5 mg twice daily. Philis Nettle stated that they will start the patient assistance form, then mail it to the patient to fill out and they have the patient bring it to our office to have Dr. Saunders Revel sign.

## 2022-08-11 NOTE — Telephone Encounter (Signed)
Tosha returning call. She requests the call back go to her direct line: [phone]

## 2022-08-14 DIAGNOSIS — I10 Essential (primary) hypertension: Secondary | ICD-10-CM | POA: Diagnosis not present

## 2022-08-14 DIAGNOSIS — H40013 Open angle with borderline findings, low risk, bilateral: Secondary | ICD-10-CM | POA: Diagnosis not present

## 2022-08-15 ENCOUNTER — Ambulatory Visit: Payer: Medicare Other

## 2022-08-15 DIAGNOSIS — R1312 Dysphagia, oropharyngeal phase: Secondary | ICD-10-CM | POA: Diagnosis not present

## 2022-08-15 DIAGNOSIS — R2681 Unsteadiness on feet: Secondary | ICD-10-CM | POA: Diagnosis not present

## 2022-08-15 DIAGNOSIS — R262 Difficulty in walking, not elsewhere classified: Secondary | ICD-10-CM | POA: Diagnosis not present

## 2022-08-15 DIAGNOSIS — H18413 Arcus senilis, bilateral: Secondary | ICD-10-CM | POA: Diagnosis not present

## 2022-08-15 DIAGNOSIS — M6281 Muscle weakness (generalized): Secondary | ICD-10-CM | POA: Diagnosis not present

## 2022-08-15 DIAGNOSIS — H401133 Primary open-angle glaucoma, bilateral, severe stage: Secondary | ICD-10-CM | POA: Diagnosis not present

## 2022-08-15 DIAGNOSIS — Z961 Presence of intraocular lens: Secondary | ICD-10-CM | POA: Diagnosis not present

## 2022-08-15 NOTE — Therapy (Signed)
OUTPATIENT PHYSICAL THERAPY NEURO TREATMENT NOTE   Patient Name: Sydney Mcdaniel MRN: [id number] DOB:1924/12/18, 89 y.o., female Today's Date: 08/15/2022  PCP: Teodora Medici, DO REFERRING PROVIDER: Teodora Medici, DO    PT End of Session - 08/15/22 1437     Visit Number 27    Number of Visits 37   **visit number corrected to reflect recert from [id number]   Date for PT Re-Evaluation 09/19/22    Authorization Type Medicare    Authorization Time Period 06/27/22-09/19/22    Progress Note Due on Visit 30    PT Start Time 0847    PT Stop Time 0928    PT Time Calculation (min) 41 min    Equipment Utilized During Treatment Gait belt    Activity Tolerance Patient tolerated treatment well;No increased pain    Behavior During Therapy WFL for tasks assessed/performed               Past Medical History:  Diagnosis Date   Allergy    Anemia    Cardiomyopathy ([place])    a. 02/2019 Echo: EF 60-65%, no rwma, doppler parameters consistent w/ pseudonormalization. Nl RV size/fxn; b. 06/2021 Echo: EF 40-45% w/ sev basal-mid antsept HK. GrII DD. Nl RV size/fxn. Mild MR/AI.   Carotid arterial disease ([place])    a. 04/2020 Carotid U/S: 1-39% bilat ICA stenosis, <50% bilat ECA stenosis; b. 06/2021 Carotid U/S: RICA 123456, LICA 123456.   Cataract    CLL (chronic lymphocytic leukemia) (HCC)    GERD (gastroesophageal reflux disease)    Glaucoma    H/O: hysterectomy    Total   History of stress test    a. 02/2019 MV: EF 60%, no ischemia/infarct.   Hyperlipidemia    Hypertension    Hypothyroidism    Impaired fasting glucose    Lichen sclerosus    Osteoporosis    Hips   PAF (paroxysmal atrial fibrillation) ([place])    a. 06/15/2016 Event monitor: 4% afib burden; b. CHA2DS2VASc - 4-->Xarelto.   Sinus bradycardia    a. avoid AVN blocking agents.   Syncope    a. 03/2016 Echo: EF 55-60%, no rwma, mild AI/MR, nl PASP; b. 03/2016 48h Holter: no significant arrhythmias/pauses; c. 03/2016 MV: mild  apical defect, likely breast attenuation, nl EF, low risk; d. 05/2016 Event monitor: No significant arrhythmia; e. 05/2016 Event monitor: PAF (4%).   Past Surgical History:  Procedure Laterality Date   APPENDECTOMY     EYE SURGERY     Glaucoma   MULTIPLE TOOTH EXTRACTIONS N/A    2 teeth   PACEMAKER IMPLANT N/A 09/22/2021   Procedure: PACEMAKER IMPLANT;  Surgeon: Deboraha Sprang, MD;  Location: [facility];  Service: Cardiovascular;  Laterality: N/A;   TOTAL ABDOMINAL HYSTERECTOMY     Patient Active Problem List   Diagnosis Date Noted   HFrEF (heart failure with reduced ejection fraction) ([place]) 05/06/2022   Sick sinus syndrome ([place]) 05/06/2022   General weakness 02/01/2022   Arm pain, anterior, right 01/30/2022   SIADH (syndrome of inappropriate ADH production) ([place]) 07/05/2021   Aspiration pneumonia ([place]) 05/21/2021   Elevated troponin 05/20/2021   Fall 02/11/2021   Normocytic anemia 05/31/2020   Goals of care, counseling/discussion 03/08/2020   Positive direct antiglobulin test (DAT) 12/04/2019   Sinus bradycardia 04/09/2019   First degree AV block 04/09/2019   Osteoporosis 08/21/2018   Bilateral carotid artery stenosis 04/19/2018   History of syncope 10/17/2017   MCI (mild cognitive impairment) 08/12/2017   Lung  nodules 07/09/2017   Orthostatic lightheadedness 04/18/2017   Paroxysmal atrial fibrillation (HCC) 04/18/2017   Hyponatremia 10/20/2016   Chronic diastolic congestive heart failure ([place]) 06/28/2016   Syncope, near 05/24/2016   Primary open-angle glaucoma, bilateral, mild stage 05/08/2016   Bladder prolapse, female, acquired 02/25/2016   Asymptomatic bacteriuria 02/25/2016   B12 deficiency 01/11/2016   Anemia 01/10/2016   CLL (chronic lymphocytic leukemia) ([place]) 01/10/2016   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 12/21/2015   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 12/21/2015   Bradycardia 12/21/2015   Lymphocytosis 06/25/2015   Impaired  fasting glucose 06/22/2015   Carotid atherosclerosis 06/22/2015   Essential hypertension 12/22/2014   Hyperlipidemia 12/22/2014   Hypothyroidism 12/22/2014    ONSET DATE: 01/30/2022  REFERRING DIAG: R26.9 (ICD-10-CM) - Abnormality of gait and mobility  THERAPY DIAG:  Unsteadiness on feet  Muscle weakness (generalized)  Difficulty in walking, not elsewhere classified  Rationale for Evaluation and Treatment Rehabilitation  SUBJECTIVE:   SUBJECTIVE STATEMENT: Pt's daughter reports they found pt has pressure issue with eye, going for consult with surgeon later today. No other updates  [name]: family member DTR  PERTINENT HISTORY: [name] back in 2022 for weakness, imbalance. She returns for generalized weakness, balance issues and falls following hospitalization 01/30/22-01/31/22 for acute on chronic hyponatremia, near syncope, RUE pain and generalized weakness. Pt biggest concern is her balance. 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 Nope   FALLS: Has patient fallen in last 6 months? Yes. Number of falls 1  PATIENT GOALS: "I want more balance"    TODAY'S TREATMENT: Gait belt donned and CGA provided throughout unless specified otherwise  NMR: Alternating step tap onto 6" step with UUE support and no UE support x multiple reps, cuing for foot clearance  One foot on floor one on 6" step hold 30 sec each LE position x  multiple reps with decreasing to no UE support  -Pt then repeats with vertical and horizontal head turns in each LE position  SLB with decreasing UE support x multiple reps of 30 sec each LE  Tandem stance with decreasing UE support x multiple reps   -Pt then performs with dual cog task in each Le position x 30 sec  SLB progression cone taps (2 cones in front of pt, one cone on 6" step). PT calls out cone tap sequence, pt performs with each LE   PATIENT EDUCATION: Education details: Pt educated throughout session about proper posture and technique with exercises. Improved exercise technique, movement at target joints, use of target muscles after min to mod verbal, visual, tactile cues.   Person educated: Patient and DTR Education method: Explanation, Demonstration, Tactile cues, and Verbal cues Education comprehension: verbalized understanding, returned demonstration, verbal cues required, tactile cues required, and needs further education   HOME EXERCISE PROGRAM: Pt to continue HEP as previously given  Instruction in use of theraputty  Access Code: [email or web address] URL: [email or web address] Date: 04/03/2022 Prepared by: Ricard Dillon  Exercises - Seated March  - 1 x daily - 5 x weekly - 3 sets -  20 reps - alternating leg hold    GOALS: Goals reviewed with patient? Yes  SHORT TERM GOALS: Target date: 09/26/2022  Patient will be independent in home exercise program to improve strength/mobility for better functional independence with ADLs. Baseline: initiated; 06/27/21: pt HEP to be advanced; 06/29/22: to be advanced, pt DTR reports pt feels comfortable is indep Goal status: MET  LONG TERM GOALS: Target date: 11/07/2022  Patient will increase FOTO score to equal to or greater than  47   to demonstrate statistically significant improvement in mobility and quality of life.  Baseline: 38 05/25/22: 42; 1/2: deferred; 06/29/22: 44; 07/20/22: 39 Goal status: PARTIALLY MET  2.   Patient (> 34 years old) will complete five times sit to stand test in < 15 seconds indicating an increased LE strength and improved balance. Baseline: 28 sec use of UE 05/25/22: 19.57 sec use of UE's on knees; 1/2: deferred; `/4/24: 23 sec hands but hands-free; 1/25: 19 sec hands-free Goal status: IN PROGRESS  3. Patient will increase 10 meter walk test to >1.39ms as to improve gait speed for better community ambulation and to reduce fall risk.  Baseline: 0.45 m/s w SPC, close CGA 05/25/22: 0.56 m/s w SPC, close CGA; 1/2: deferred; 1/4: 0.45 m/s with SPC close CGA ; 1/25: 0.63 m/s with SPC Goal status: IN PROGRESS  4.   Patient will reduce timed up and go to <15 seconds to reduce fall risk and demonstrate improved transfer/gait ability. [facility] 29.19 sec with SPC; 06/27/22: deferred; 06/29/22: 34 seconds with SPC and close CGA; 1/25: 26 sec with SPC, close CGA Goal status: IN PROGRESS  5.   Patient will increase Berg Balance score by > 6 points to demonstrate decreased fall risk during functional activities. Baseline: to be completed next 1-2 sessions; 10/18: 32/56 05/25/22:  37/56; 06/27/22: deferred; 06/29/22: 38/56 ; 1/25: 38/40 Goal status: IN PROGRESS   ASSESSMENT:  CLINICAL IMPRESSION: Continued balance interventions with focus on SLB. Pt exhibits good control with cone tap exercise, however, her balance seems in part limited by continued FOF. The pt will continue to benefit from skilled PT services to address deficits and impairment identified in evaluation in order to maximize independence and safety in basic mobility required for performance of ADL, IADL, and leisure.    OBJECTIVE IMPAIRMENTS Abnormal gait, decreased activity tolerance, decreased balance, decreased cognition, decreased endurance, decreased knowledge of use of DME, decreased mobility, difficulty walking, decreased strength, impaired vision/preception, improper body mechanics, postural  dysfunction, and pain.   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   REHAB POTENTIAL: Good  CLINICAL DECISION MAKING: Evolving/moderate complexity  EVALUATION COMPLEXITY: Moderate  PLAN: PT FREQUENCY: 2x/week  PT DURATION: 12 weeks  PLANNED INTERVENTIONS: Therapeutic exercises, Therapeutic activity, Neuromuscular re-education, Balance training, Gait training, Patient/Family education, Self Care, Joint mobilization, Stair training, Vestibular training, Canalith repositioning, Orthotic/Fit training, DME instructions, Wheelchair mobility training, Spinal mobilization, Cryotherapy, Moist heat, scar mobilization, Splintting, Taping, Ultrasound, Parrafin,  Biofeedback, Manual therapy, and Re-evaluation  PLAN FOR NEXT SESSION:  Strength, balance, SPC gait training, continue plan, LLE balance   2:41 PM, 08/15/22  Physical Therapist - [facility]  Outpatient Physical Therapy- [address] [phone]    Zollie Pee PT  08/15/22, 2:41 PM

## 2022-08-17 ENCOUNTER — Ambulatory Visit: Payer: Medicare Other

## 2022-08-17 DIAGNOSIS — R1312 Dysphagia, oropharyngeal phase: Secondary | ICD-10-CM | POA: Diagnosis not present

## 2022-08-17 DIAGNOSIS — R2681 Unsteadiness on feet: Secondary | ICD-10-CM | POA: Diagnosis not present

## 2022-08-17 DIAGNOSIS — M6281 Muscle weakness (generalized): Secondary | ICD-10-CM

## 2022-08-17 DIAGNOSIS — R262 Difficulty in walking, not elsewhere classified: Secondary | ICD-10-CM | POA: Diagnosis not present

## 2022-08-17 NOTE — Therapy (Signed)
OUTPATIENT PHYSICAL THERAPY NEURO TREATMENT NOTE   Patient Name: Sydney Mcdaniel MRN: [id number] DOB:16-Jan-1925, 89 y.o., female Today's Date: 08/17/2022  PCP: Teodora Medici, DO REFERRING PROVIDER: Teodora Medici, DO    PT End of Session - 08/17/22 0859     Visit Number 28    Number of Visits 37    Date for PT Re-Evaluation 09/19/22    Authorization Type Medicare    Authorization Time Period 06/27/22-09/19/22    Progress Note Due on Visit 30    PT Start Time 0847    PT Stop Time 0927    PT Time Calculation (min) 40 min    Equipment Utilized During Treatment Gait belt    Activity Tolerance Patient tolerated treatment well;No increased pain    Behavior During Therapy WFL for tasks assessed/performed               Past Medical History:  Diagnosis Date   Allergy    Anemia    Cardiomyopathy ([place])    a. 02/2019 Echo: EF 60-65%, no rwma, doppler parameters consistent w/ pseudonormalization. Nl RV size/fxn; b. 06/2021 Echo: EF 40-45% w/ sev basal-mid antsept HK. GrII DD. Nl RV size/fxn. Mild MR/AI.   Carotid arterial disease ([place])    a. 04/2020 Carotid U/S: 1-39% bilat ICA stenosis, <50% bilat ECA stenosis; b. 06/2021 Carotid U/S: RICA 123456, LICA 123456.   Cataract    CLL (chronic lymphocytic leukemia) (HCC)    GERD (gastroesophageal reflux disease)    Glaucoma    H/O: hysterectomy    Total   History of stress test    a. 02/2019 MV: EF 60%, no ischemia/infarct.   Hyperlipidemia    Hypertension    Hypothyroidism    Impaired fasting glucose    Lichen sclerosus    Osteoporosis    Hips   PAF (paroxysmal atrial fibrillation) ([place])    a. 06/15/2016 Event monitor: 4% afib burden; b. CHA2DS2VASc - 4-->Xarelto.   Sinus bradycardia    a. avoid AVN blocking agents.   Syncope    a. 03/2016 Echo: EF 55-60%, no rwma, mild AI/MR, nl PASP; b. 03/2016 48h Holter: no significant arrhythmias/pauses; c. 03/2016 MV: mild apical defect, likely breast attenuation, nl EF, low risk;  d. 05/2016 Event monitor: No significant arrhythmia; e. 05/2016 Event monitor: PAF (4%).   Past Surgical History:  Procedure Laterality Date   APPENDECTOMY     EYE SURGERY     Glaucoma   MULTIPLE TOOTH EXTRACTIONS N/A    2 teeth   PACEMAKER IMPLANT N/A 09/22/2021   Procedure: PACEMAKER IMPLANT;  Surgeon: Deboraha Sprang, MD;  Location: [facility];  Service: Cardiovascular;  Laterality: N/A;   TOTAL ABDOMINAL HYSTERECTOMY     Patient Active Problem List   Diagnosis Date Noted   HFrEF (heart failure with reduced ejection fraction) ([place]) 05/06/2022   Sick sinus syndrome ([place]) 05/06/2022   General weakness 02/01/2022   Arm pain, anterior, right 01/30/2022   SIADH (syndrome of inappropriate ADH production) ([place]) 07/05/2021   Aspiration pneumonia ([place]) 05/21/2021   Elevated troponin 05/20/2021   Fall 02/11/2021   Normocytic anemia 05/31/2020   Goals of care, counseling/discussion 03/08/2020   Positive direct antiglobulin test (DAT) 12/04/2019   Sinus bradycardia 04/09/2019   First degree AV block 04/09/2019   Osteoporosis 08/21/2018   Bilateral carotid artery stenosis 04/19/2018   History of syncope 10/17/2017   MCI (mild cognitive impairment) 08/12/2017   Lung nodules 07/09/2017   Orthostatic lightheadedness 04/18/2017  Paroxysmal atrial fibrillation (HCC) 04/18/2017   Hyponatremia 10/20/2016   Chronic diastolic congestive heart failure ([place]) 06/28/2016   Syncope, near 05/24/2016   Primary open-angle glaucoma, bilateral, mild stage 05/08/2016   Bladder prolapse, female, acquired 02/25/2016   Asymptomatic bacteriuria 02/25/2016   B12 deficiency 01/11/2016   Anemia 01/10/2016   CLL (chronic lymphocytic leukemia) ([place]) 01/10/2016   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 12/21/2015   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 12/21/2015   Bradycardia 12/21/2015   Lymphocytosis 06/25/2015   Impaired fasting glucose 06/22/2015   Carotid atherosclerosis  06/22/2015   Essential hypertension 12/22/2014   Hyperlipidemia 12/22/2014   Hypothyroidism 12/22/2014    ONSET DATE: 01/30/2022  REFERRING DIAG: R26.9 (ICD-10-CM) - Abnormality of gait and mobility  THERAPY DIAG:  Unsteadiness on feet  Muscle weakness (generalized)  Difficulty in walking, not elsewhere classified  Rationale for Evaluation and Treatment Rehabilitation  SUBJECTIVE:   SUBJECTIVE STATEMENT: DTR reports pt saw eye specialist last week, they recommend conservative management, and changed eye drop medications. FU on March 11. Still using rollator at home. SPC when out on occasion.   [name]: family member DTR  PERTINENT HISTORY: [name] back in 2022 for weakness, imbalance. She returns for generalized weakness, balance issues and falls following hospitalization 01/30/22-01/31/22 for acute on chronic hyponatremia, near syncope, RUE pain and generalized weakness. Pt biggest concern is her balance. 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 Nope   FALLS: Has patient fallen in last 6 months? Yes. Number of falls 1  PATIENT GOALS: "I want more balance"    TODAY'S TREATMENT: -overground AMB c SPC 325f, cues for cane stability  -overground AMB c rollator 3030f  -Fwd step taps 4" step c cues for RUE stabilization on SPC x2 minutes, then repeat on 6" step (4" + airex)  -AMB in gym around obstacles with SPC and 2.5lb AW x2  minutes   PATIENT EDUCATION: Education details: Pt educated throughout session about proper posture and technique with exercises. Improved exercise technique, movement at target joints, use of target muscles after min to mod verbal, visual, tactile cues.   Person educated: Patient and DTR Education method: Explanation, Demonstration, Tactile cues, and Verbal cues Education comprehension: verbalized understanding, returned demonstration, verbal cues required, tactile cues required, and needs further education   HOME EXERCISE PROGRAM: Pt to continue HEP as previously given  Instruction in use of theraputty  Access Code: [email or web address] Date: 04/03/2022 Prepared by: HaRicard DillonExercises - Seated March  - 1 x daily - 5 x weekly - 3 sets - 20 reps - alternating leg hold    GOALS: Goals reviewed with patient? Yes  SHORT TERM GOALS: Target date: 09/28/2022  Patient will be independent in home exercise program to improve strength/mobility for better functional independence with ADLs. Baseline: initiated; 06/27/21: pt HEP to be advanced; 06/29/22: to be advanced, pt DTR reports pt feels comfortable is indep Goal status: MET  LONG TERM GOALS: Target date: 11/09/2022  Patient will increase FOTO score to equal to or greater than  47   to demonstrate statistically significant improvement in mobility and quality of life.  Baseline: 38 05/25/22: 42; 1/2: deferred; 06/29/22: 44; 07/20/22: 39 Goal status: PARTIALLY MET  2.  Patient (> 25 years old) will complete five times sit to stand test in < 15 seconds indicating an increased LE strength and improved balance. Baseline: 28 sec use of UE 05/25/22: 19.57 sec use of UE's on knees; 1/2: deferred; `/4/24: 23 sec hands but hands-free; 1/25: 19 sec hands-free Goal status: IN PROGRESS  3. Patient will increase 10 meter walk test to >1.5ms as to improve gait speed for better community ambulation and to reduce fall  risk.  Baseline: 0.45 m/s w SPC, close CGA 05/25/22: 0.56 m/s w SPC, close CGA; 1/2: deferred; 1/4: 0.45 m/s with SPC close CGA ; 1/25: 0.63 m/s with SPC Goal status: IN PROGRESS  4.   Patient will reduce timed up and go to <15 seconds to reduce fall risk and demonstrate improved transfer/gait ability. [facility] 29.19 sec with SPC; 06/27/22: deferred; 06/29/22: 34 seconds with SPC and close CGA; 1/25: 26 sec with SPC, close CGA Goal status: IN PROGRESS  5.   Patient will increase Berg Balance score by > 6 points to demonstrate decreased fall risk during functional activities. Baseline: to be completed next 1-2 sessions; 10/18: 32/56 05/25/22:  37/56; 06/27/22: deferred; 06/29/22: 38/56 ; 1/25: 38/40 Goal status: IN PROGRESS   ASSESSMENT:  CLINICAL IMPRESSION: Continued SPC gait training. Confidence and utility remains less than optimal, pt still reliant on LUe on fixed objects for stabilization preferences. Suspect high level of volume for practice with multiple simulated LOB and self righting opportunities to really make proficient use of SPC for daily activity. The pt will continue to benefit from skilled PT services to address deficits and impairment identified in evaluation in order to maximize independence and safety in basic mobility required for performance of ADL, IADL, and leisure.    OBJECTIVE IMPAIRMENTS Abnormal gait, decreased activity tolerance, decreased balance, decreased cognition, decreased endurance, decreased knowledge of use of DME, decreased mobility, difficulty walking, decreased strength, impaired vision/preception, improper body mechanics, postural dysfunction, and pain.   ACTIVITY LIMITATIONS squatting, stairs, transfers, and locomotion level  PARTICIPATION LIMITATIONS: meal prep, cleaning, laundry, shopping, community activity, and yard work  PERSONAL FACTORS Age, Fitness, Past/current experiences, Sex, and 3+ comorbidities: Per chart PMH  significant for hypertension, hyperlipidemia, chronic lymphocytic leukemia, chronic hyponatremia and SIADH, generalized weakness, hypothyroidism, carotid atherosclerosis, lymphocytosis, neoplasm of uncertain behavior of skin of ear and nose, B12 deficiency, bladder prolapse, glaucoma, Chronic diastolic congestive heart failure, paroxysmal atria fibrillation, lung nodules, mild cognitive impairment, osteoporosis, first degree AV block, normocytic anemia, falls,   are also affecting patient's functional outcome.   REHAB POTENTIAL: Good  CLINICAL DECISION MAKING: Evolving/moderate complexity  EVALUATION COMPLEXITY: Moderate  PLAN: PT FREQUENCY: 2x/week  PT DURATION: 12 weeks  PLANNED INTERVENTIONS: Therapeutic exercises, Therapeutic activity, Neuromuscular re-education, Balance training, Gait training, Patient/Family education, Self Care, Joint mobilization, Stair training, Vestibular training, Canalith repositioning, Orthotic/Fit training, DME instructions, Wheelchair mobility training, Spinal mobilization, Cryotherapy, Moist heat, scar mobilization, Splintting, Taping, Ultrasound, Parrafin, Biofeedback, Manual therapy, and Re-evaluation  PLAN FOR NEXT SESSION:  Strength, balance, SPC gait training, continue plan, LLE balance   9:00 AM, 08/17/22  Physical Therapist - [id number] [phone]   AEtta GrandchildPT  08/17/22, 9:00 AM

## 2022-08-22 ENCOUNTER — Ambulatory Visit: Payer: Medicare Other

## 2022-08-22 DIAGNOSIS — M6281 Muscle weakness (generalized): Secondary | ICD-10-CM

## 2022-08-22 DIAGNOSIS — R2681 Unsteadiness on feet: Secondary | ICD-10-CM | POA: Diagnosis not present

## 2022-08-22 DIAGNOSIS — R262 Difficulty in walking, not elsewhere classified: Secondary | ICD-10-CM | POA: Diagnosis not present

## 2022-08-22 DIAGNOSIS — R1312 Dysphagia, oropharyngeal phase: Secondary | ICD-10-CM | POA: Diagnosis not present

## 2022-08-22 NOTE — Therapy (Signed)
OUTPATIENT PHYSICAL THERAPY NEURO TREATMENT NOTE   Patient Name: Sydney Mcdaniel MRN: [id number] DOB:08/01/1924, 89 y.o., female Today's Date: 08/22/2022  PCP: Teodora Medici, DO REFERRING PROVIDER: Teodora Medici, DO    PT End of Session - 08/22/22 0940     Visit Number 29    Number of Visits 37    Date for PT Re-Evaluation 09/19/22    Authorization Type Medicare    Authorization Time Period 06/27/22-09/19/22    Progress Note Due on Visit 30    PT Start Time 0848    PT Stop Time 0932    PT Time Calculation (min) 44 min    Equipment Utilized During Treatment Gait belt    Activity Tolerance Patient tolerated treatment well;No increased pain    Behavior During Therapy WFL for tasks assessed/performed                Past Medical History:  Diagnosis Date   Allergy    Anemia    Cardiomyopathy ([place])    a. 02/2019 Echo: EF 60-65%, no rwma, doppler parameters consistent w/ pseudonormalization. Nl RV size/fxn; b. 06/2021 Echo: EF 40-45% w/ sev basal-mid antsept HK. GrII DD. Nl RV size/fxn. Mild MR/AI.   Carotid arterial disease ([place])    a. 04/2020 Carotid U/S: 1-39% bilat ICA stenosis, <50% bilat ECA stenosis; b. 06/2021 Carotid U/S: RICA 123456, LICA 123456.   Cataract    CLL (chronic lymphocytic leukemia) (HCC)    GERD (gastroesophageal reflux disease)    Glaucoma    H/O: hysterectomy    Total   History of stress test    a. 02/2019 MV: EF 60%, no ischemia/infarct.   Hyperlipidemia    Hypertension    Hypothyroidism    Impaired fasting glucose    Lichen sclerosus    Osteoporosis    Hips   PAF (paroxysmal atrial fibrillation) ([place])    a. 06/15/2016 Event monitor: 4% afib burden; b. CHA2DS2VASc - 4-->Xarelto.   Sinus bradycardia    a. avoid AVN blocking agents.   Syncope    a. 03/2016 Echo: EF 55-60%, no rwma, mild AI/MR, nl PASP; b. 03/2016 48h Holter: no significant arrhythmias/pauses; c. 03/2016 MV: mild apical defect, likely breast attenuation, nl EF, low  risk; d. 05/2016 Event monitor: No significant arrhythmia; e. 05/2016 Event monitor: PAF (4%).   Past Surgical History:  Procedure Laterality Date   APPENDECTOMY     EYE SURGERY     Glaucoma   MULTIPLE TOOTH EXTRACTIONS N/A    2 teeth   PACEMAKER IMPLANT N/A 09/22/2021   Procedure: PACEMAKER IMPLANT;  Surgeon: Deboraha Sprang, MD;  Location: [facility];  Service: Cardiovascular;  Laterality: N/A;   TOTAL ABDOMINAL HYSTERECTOMY     Patient Active Problem List   Diagnosis Date Noted   HFrEF (heart failure with reduced ejection fraction) ([place]) 05/06/2022   Sick sinus syndrome ([place]) 05/06/2022   General weakness 02/01/2022   Arm pain, anterior, right 01/30/2022   SIADH (syndrome of inappropriate ADH production) ([place]) 07/05/2021   Aspiration pneumonia ([place]) 05/21/2021   Elevated troponin 05/20/2021   Fall 02/11/2021   Normocytic anemia 05/31/2020   Goals of care, counseling/discussion 03/08/2020   Positive direct antiglobulin test (DAT) 12/04/2019   Sinus bradycardia 04/09/2019   First degree AV block 04/09/2019   Osteoporosis 08/21/2018   Bilateral carotid artery stenosis 04/19/2018   History of syncope 10/17/2017   MCI (mild cognitive impairment) 08/12/2017   Lung nodules 07/09/2017   Orthostatic lightheadedness 04/18/2017  Paroxysmal atrial fibrillation (HCC) 04/18/2017   Hyponatremia 10/20/2016   Chronic diastolic congestive heart failure ([place]) 06/28/2016   Syncope, near 05/24/2016   Primary open-angle glaucoma, bilateral, mild stage 05/08/2016   Bladder prolapse, female, acquired 02/25/2016   Asymptomatic bacteriuria 02/25/2016   B12 deficiency 01/11/2016   Anemia 01/10/2016   CLL (chronic lymphocytic leukemia) (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 12/21/2015   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 12/21/2015   Bradycardia 12/21/2015   Lymphocytosis 06/25/2015   Impaired fasting glucose 06/22/2015   Carotid atherosclerosis  06/22/2015   Essential hypertension 12/22/2014   Hyperlipidemia 12/22/2014   Hypothyroidism 12/22/2014    ONSET DATE: 01/30/2022  REFERRING DIAG: R26.9 (ICD-10-CM) - Abnormality of gait and mobility  THERAPY DIAG:  Muscle weakness (generalized)  Difficulty in walking, not elsewhere classified  Unsteadiness on feet  Rationale for Evaluation and Treatment Rehabilitation  SUBJECTIVE:   SUBJECTIVE STATEMENT: Pt's DTR reports there was a recent change in pt's eye medication. Since change pt's eye slightly more red, draining more. They plan to contact her doctor to determine next steps.   [name]: family member DTR  PERTINENT HISTORY: [name] back in 2022 for weakness, imbalance. She returns for generalized weakness, balance issues and falls following hospitalization 01/30/22-01/31/22 for acute on chronic hyponatremia, near syncope, RUE pain and generalized weakness. Pt biggest concern is her balance. 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 Nope   FALLS: Has patient fallen in last 6 months? Yes. Number of falls 1  PATIENT GOALS: "I want more balance"    TODAY'S TREATMENT: Gait belt donned and CGA provided throughout unless specified otherwise  TherEx:  Gait with 2.5# AW each LE 2x148 ft. Pt reports intervention is a little tiring.  2.5# AW each LE, seated: LAQ 20x each LE March 20x each LE  Staggered STS 5x each LE.  Difficult  Nustep endurance training lvl 1-3 x 4 minutes. Min a for LE placement, close CGA for mount/dismount, cuing throughout for technique  NMR: Alternating step tap onto 6" step with UUE support and no UE support x multiple reps, cuing for foot clearance, focus on LLE tapping  One foot on floor one on 6" step hold 60 sec each LE position, no UE support   Tandem stance 45 sec each LE, intermittent UE support   PATIENT EDUCATION: Education details: Pt educated throughout session about proper posture and technique with exercises. Improved exercise technique, movement at target joints, use of target muscles after min to mod verbal, visual, tactile cues.   Person educated: Patient and DTR Education method: Explanation, Demonstration, Tactile cues, and Verbal cues Education comprehension: verbalized understanding, returned demonstration, verbal cues required, tactile cues required, and needs further education   HOME EXERCISE PROGRAM: Pt to continue HEP as previously given  Instruction in use of theraputty  Access Code: [email or web address] URL: [email or web address] Date: 04/03/2022 Prepared by: Ricard Dillon  Exercises - Seated March  - 1 x daily - 5 x weekly - 3 sets - 20 reps - alternating leg hold  GOALS: Goals reviewed with patient? Yes  SHORT TERM GOALS: Target date: 10/03/2022  Patient will be independent in home exercise program to improve strength/mobility for better functional independence with ADLs. Baseline: initiated; 06/27/21: pt HEP to be advanced; 06/29/22: to be advanced, pt DTR reports pt feels comfortable is indep Goal status: MET  LONG TERM GOALS: Target date: 11/14/2022  Patient will increase FOTO score to equal to or greater than  47   to demonstrate statistically significant improvement in mobility and quality of life.  Baseline: 38 05/25/22: 42; 1/2: deferred; 06/29/22: 44; 07/20/22: 39 Goal status: PARTIALLY MET  2.  Patient (> 64 years old) will  complete five times sit to stand test in < 15 seconds indicating an increased LE strength and improved balance. Baseline: 28 sec use of UE 05/25/22: 19.57 sec use of UE's on knees; 1/2: deferred; `/4/24: 23 sec hands but hands-free; 1/25: 19 sec hands-free Goal status: IN PROGRESS  3. Patient will increase 10 meter walk test to >1.64ms as to improve gait speed for better community ambulation and to reduce fall risk.  Baseline: 0.45 m/s w SPC, close CGA 05/25/22: 0.56 m/s w SPC, close CGA; 1/2: deferred; 1/4: 0.45 m/s with SPC close CGA ; 1/25: 0.63 m/s with SPC Goal status: IN PROGRESS  4.   Patient will reduce timed up and go to <15 seconds to reduce fall risk and demonstrate improved transfer/gait ability. [facility]: 29.19 sec with SPC; 06/27/22: deferred; 06/29/22: 34 seconds with SPC and close CGA; 1/25: 26 sec with SPC, close CGA Goal status: IN PROGRESS  5.   Patient will increase Berg Balance score by > 6 points to demonstrate decreased fall risk during functional activities. Baseline: to be completed next 1-2 sessions; 10/18: 32/56 05/25/22:  37/56; 06/27/22: deferred; 06/29/22: 38/56 ; 1/25: 38/40 Goal status: IN PROGRESS   ASSESSMENT:  CLINICAL IMPRESSION: Pt not feeling good today with increased eye symptoms affecting her vision (chronic issue, pt and DTR plan to contact her physician/being followed by physician), likely impacting pt's balance performance in session. Pt with somewhat increased difficulty with static balance interventions. Despite this, pt highly motivated to participate in PT, and she continues to perform more challenging LE STS intervention. The pt will continue to benefit from skilled PT services to address deficits and impairment identified in evaluation in order to maximize independence and safety in basic mobility required for performance of ADL, IADL, and leisure.    OBJECTIVE IMPAIRMENTS Abnormal gait, decreased activity tolerance,  decreased balance, decreased cognition, decreased endurance, decreased knowledge of use of DME, decreased mobility, difficulty walking, decreased strength, impaired vision/preception, improper body mechanics, postural dysfunction, and pain.   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   REHAB POTENTIAL: Good  CLINICAL DECISION MAKING: Evolving/moderate complexity  EVALUATION COMPLEXITY: Moderate  PLAN: PT FREQUENCY: 2x/week  PT DURATION: 12 weeks  PLANNED INTERVENTIONS: Therapeutic exercises, Therapeutic activity, Neuromuscular re-education, Balance training, Gait training, Patient/Family education, Self Care,  Joint mobilization, Stair training, Vestibular training, Canalith repositioning, Orthotic/Fit training, DME instructions, Wheelchair mobility training, Spinal mobilization, Cryotherapy, Moist heat, scar mobilization, Splintting, Taping, Ultrasound, Parrafin, Biofeedback, Manual therapy, and Re-evaluation  PLAN FOR NEXT SESSION:  Strength, balance, SPC gait training, continue plan, LLE balance   9:46 AM, 08/22/22  Physical Therapist - [facility]  Outpatient Physical Therapy- [place] [phone]    Zollie Pee PT  08/22/22, 9:46 AM

## 2022-08-23 DIAGNOSIS — H401133 Primary open-angle glaucoma, bilateral, severe stage: Secondary | ICD-10-CM | POA: Diagnosis not present

## 2022-08-23 DIAGNOSIS — H18413 Arcus senilis, bilateral: Secondary | ICD-10-CM | POA: Diagnosis not present

## 2022-08-23 DIAGNOSIS — C9190 Lymphoid leukemia, unspecified not having achieved remission: Secondary | ICD-10-CM | POA: Diagnosis not present

## 2022-08-23 DIAGNOSIS — I10 Essential (primary) hypertension: Secondary | ICD-10-CM | POA: Diagnosis not present

## 2022-08-23 DIAGNOSIS — Z961 Presence of intraocular lens: Secondary | ICD-10-CM | POA: Diagnosis not present

## 2022-08-23 DIAGNOSIS — H18221 Idiopathic corneal edema, right eye: Secondary | ICD-10-CM | POA: Diagnosis not present

## 2022-08-23 DIAGNOSIS — E871 Hypo-osmolality and hyponatremia: Secondary | ICD-10-CM | POA: Diagnosis not present

## 2022-08-23 NOTE — Therapy (Signed)
OUTPATIENT PHYSICAL THERAPY NEURO TREATMENT NOTE/Physical Therapy Progress Note   Dates of reporting period  07/20/2022   to   08/24/2022    Patient Name: Sydney Mcdaniel MRN: [id number] DOB:[birth year], 89 y.o., female Today's Date: 08/24/2022  PCP: Teodora Medici, DO REFERRING PROVIDER: Teodora Medici, DO    PT End of Session - 08/24/22 1008     Visit Number 30    Number of Visits 8    Date for PT Re-Evaluation 09/19/22    Authorization Type Medicare    Authorization Time Period 06/27/22-09/19/22    Progress Note Due on Visit 30    PT Start Time 0847    PT Stop Time 0929    PT Time Calculation (min) 42 min    Equipment Utilized During Treatment Gait belt    Activity Tolerance Patient tolerated treatment well;No increased pain    Behavior During Therapy WFL for tasks assessed/performed                Past Medical History:  Diagnosis Date   Allergy    Anemia    Cardiomyopathy ([place])    a. 02/2019 Echo: EF 60-65%, no rwma, doppler parameters consistent w/ pseudonormalization. Nl RV size/fxn; b. 06/2021 Echo: EF 40-45% w/ sev basal-mid antsept HK. GrII DD. Nl RV size/fxn. Mild MR/AI.   Carotid arterial disease ([place])    a. 04/2020 Carotid U/S: 1-39% bilat ICA stenosis, <50% bilat ECA stenosis; b. 06/2021 Carotid U/S: RICA 123456, LICA 123456.   Cataract    CLL (chronic lymphocytic leukemia) (HCC)    GERD (gastroesophageal reflux disease)    Glaucoma    H/O: hysterectomy    Total   History of stress test    a. 02/2019 MV: EF 60%, no ischemia/infarct.   Hyperlipidemia    Hypertension    Hypothyroidism    Impaired fasting glucose    Lichen sclerosus    Osteoporosis    Hips   PAF (paroxysmal atrial fibrillation) ([place])    a. 06/15/2016 Event monitor: 4% afib burden; b. CHA2DS2VASc - 4-->Xarelto.   Sinus bradycardia    a. avoid AVN blocking agents.   Syncope    a. 03/2016 Echo: EF 55-60%, no rwma, mild AI/MR, nl PASP; b. 03/2016 48h Holter: no significant  arrhythmias/pauses; c. 03/2016 MV: mild apical defect, likely breast attenuation, nl EF, low risk; d. 05/2016 Event monitor: No significant arrhythmia; e. 05/2016 Event monitor: PAF (4%).   Past Surgical History:  Procedure Laterality Date   APPENDECTOMY     EYE SURGERY     Glaucoma   MULTIPLE TOOTH EXTRACTIONS N/A    2 teeth   PACEMAKER IMPLANT N/A 09/22/2021   Procedure: PACEMAKER IMPLANT;  Surgeon: Deboraha Sprang, MD;  Location: [facility];  Service: Cardiovascular;  Laterality: N/A;   TOTAL ABDOMINAL HYSTERECTOMY     Patient Active Problem List   Diagnosis Date Noted   HFrEF (heart failure with reduced ejection fraction) ([place]) 05/06/2022   Sick sinus syndrome ([place]) 05/06/2022   General weakness 02/01/2022   Arm pain, anterior, right 01/30/2022   SIADH (syndrome of inappropriate ADH production) ([place]) 07/05/2021   Aspiration pneumonia ([place]) 05/21/2021   Elevated troponin 05/20/2021   Fall 02/11/2021   Normocytic anemia 05/31/2020   Goals of care, counseling/discussion 03/08/2020   Positive direct antiglobulin test (DAT) 12/04/2019   Sinus bradycardia 04/09/2019   First degree AV block 04/09/2019   Osteoporosis 08/21/2018   Bilateral carotid artery stenosis 04/19/2018   History of syncope 10/17/2017  MCI (mild cognitive impairment) 08/12/2017   Lung nodules 07/09/2017   Orthostatic lightheadedness 04/18/2017   Paroxysmal atrial fibrillation (HCC) 04/18/2017   Hyponatremia 10/20/2016   Chronic diastolic congestive heart failure ([place]) 06/28/2016   Syncope, near 05/24/2016   Primary open-angle glaucoma, bilateral, mild stage 05/08/2016   Bladder prolapse, female, acquired 02/25/2016   Asymptomatic bacteriuria 02/25/2016   B12 deficiency 01/11/2016   Anemia 01/10/2016   CLL (chronic lymphocytic leukemia) ([place]) 01/10/2016   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 12/21/2015   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 12/21/2015   Bradycardia 12/21/2015    Lymphocytosis 06/25/2015   Impaired fasting glucose 06/22/2015   Carotid atherosclerosis 06/22/2015   Essential hypertension 12/22/2014   Hyperlipidemia 12/22/2014   Hypothyroidism 12/22/2014    ONSET DATE: 01/30/2022  REFERRING DIAG: R26.9 (ICD-10-CM) - Abnormality of gait and mobility  THERAPY DIAG:  Unsteadiness on feet  Muscle weakness (generalized)  Difficulty in walking, not elsewhere classified  Rationale for Evaluation and Treatment Rehabilitation  SUBJECTIVE:   SUBJECTIVE STATEMENT: Pt reports her eye is worse today. Continues to appear red/feel uncomfortable. She was seen by her doctor following last appointment for this, continuing to be followed by MD. Pt reports no falls/stumbles.  [name]: family member DTR  PERTINENT HISTORY: [name] back in 2022 for weakness, imbalance. She returns for generalized weakness, balance issues and falls following hospitalization 01/30/22-01/31/22 for acute on chronic hyponatremia, near syncope, RUE pain and generalized weakness. Pt biggest concern is her balance. 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 Nope   FALLS: Has patient fallen in last 6 months? Yes. Number of falls 1  PATIENT GOALS: "I want more balance"    TODAY'S TREATMENT: Gait belt donned and CGA provided throughout unless specified otherwise  TherAct:  Goals reassessed for progress note. Please refer to goal  section for details.  NMR:  Practice stepping into semi-tandem and holding 3x30-45 sec each LE  PATIENT EDUCATION: Education details: Pt educated throughout session about proper posture and technique with exercises. Improved exercise technique, movement at target joints, use of target muscles after min to mod verbal, visual, tactile cues.   Person educated: Patient and DTR Education method: Explanation, Demonstration, Tactile cues, and Verbal cues Education comprehension: verbalized understanding, returned demonstration, verbal cues required, tactile cues required, and needs further education   HOME EXERCISE PROGRAM: Pt to continue HEP as previously given  Instruction in use of theraputty  Access Code: [email or web address] URL: [email or web address] Date: 04/03/2022 Prepared by: Ricard Dillon  Exercises - Seated March  - 1 x daily - 5 x weekly - 3 sets - 20 reps - alternating leg hold    GOALS: Goals reviewed with patient? Yes  SHORT TERM GOALS: Target date: 10/05/2022  Patient will be independent in home exercise program to improve strength/mobility for better functional independence with ADLs. Baseline: initiated; 06/27/21: pt HEP to be advanced; 06/29/22: to be advanced, pt DTR reports pt feels comfortable is indep Goal status: MET  LONG TERM GOALS: Target date: 11/16/2022  Patient will increase FOTO score to equal to  or greater than  47   to demonstrate statistically significant improvement in mobility and quality of life.  Baseline: 38 05/25/22: 42; 1/2: deferred; 06/29/22: 44; 07/20/22: 39; 08/24/22: 44% Goal status: PARTIALLY MET  2.  Patient (> 52 years old) will complete five times sit to stand test in < 15 seconds indicating an increased LE strength and improved balance. Baseline: 28 sec use of UE 05/25/22: 19.57 sec use of UE's on knees; 1/2: deferred; `/4/24: 23 sec hands but hands-free; 1/25: 19 sec hands-free; 2/29: 16 seconds hands-free  Goal status: Partially  MET  3. Patient will increase 10 meter walk test to >1.79ms as to improve gait speed for better community ambulation and to reduce fall risk.  Baseline: 0.45 m/s w SPC, close CGA 05/25/22: 0.56 m/s w SPC, close CGA; 1/2: deferred; 1/4: 0.45 m/s with SPC close CGA ; 1/25: 0.63 m/s with SPC; 08/24/22: 0.7 m/s with SPC  Goal status: IN PROGRESS  4.   Patient will reduce timed up and go to <15 seconds to reduce fall risk and demonstrate improved transfer/gait ability. Baseline: 32 sec with SPC 05/25/22: 29.19 sec with SPC; 06/27/22: deferred; 06/29/22: 34 seconds with SPC and close CGA; 1/25: 26 sec with SPC, close CGA; 08/24/22: 25 seconds  Goal status: IN PROGRESS  5.   Patient will increase Berg Balance score by > 6 points to demonstrate decreased fall risk during functional activities. Baseline: to be completed next 1-2 sessions; 10/18: 32/56 05/25/22:  37/56; 06/27/22: deferred; 06/29/22: 38/56 ; 1/25: 38/40; 2/29/42: 45/56  Goal status: MET   ASSESSMENT:  CLINICAL IMPRESSION: Goals reassessed for progress note. The pt is making gains on all tests, and has met Berg Balance test goal. These improvements indicate a reduction in fall risk, improved balance, and increased gait speed and BLE strength. The pt will continue to benefit from skilled PT services to address deficits and impairment identified in evaluation in order to maximize independence and safety in basic mobility required for performance of ADL, IADL, and leisure.    OBJECTIVE IMPAIRMENTS Abnormal gait, decreased activity tolerance, decreased balance, decreased cognition, decreased endurance, decreased knowledge of use of DME, decreased mobility, difficulty walking, decreased strength, impaired vision/preception, improper body mechanics, postural dysfunction, and pain.   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   REHAB POTENTIAL: Good  CLINICAL DECISION MAKING: Evolving/moderate complexity  EVALUATION COMPLEXITY: Moderate  PLAN: PT FREQUENCY: 2x/week  PT DURATION: 12 weeks  PLANNED INTERVENTIONS: Therapeutic exercises, Therapeutic activity, Neuromuscular re-education, Balance training, Gait training, Patient/Family education, Self Care, Joint mobilization, Stair training, Vestibular training, Canalith repositioning, Orthotic/Fit training, DME instructions, Wheelchair mobility training, Spinal mobilization, Cryotherapy, Moist heat, scar mobilization, Splintting, Taping, Ultrasound, Parrafin, Biofeedback, Manual therapy, and Re-evaluation  PLAN FOR NEXT SESSION:  Strength, balance, SPC gait training, continue plan, LLE balance   10:13 AM, 08/24/22  Physical Therapist - [phone]   HZollie Pee  PT  08/24/22, 10:13 AM

## 2022-08-24 ENCOUNTER — Telehealth: Payer: Self-pay | Admitting: Internal Medicine

## 2022-08-24 ENCOUNTER — Ambulatory Visit: Payer: Medicare Other

## 2022-08-24 DIAGNOSIS — M6281 Muscle weakness (generalized): Secondary | ICD-10-CM

## 2022-08-24 DIAGNOSIS — R1312 Dysphagia, oropharyngeal phase: Secondary | ICD-10-CM | POA: Diagnosis not present

## 2022-08-24 DIAGNOSIS — R2681 Unsteadiness on feet: Secondary | ICD-10-CM | POA: Diagnosis not present

## 2022-08-24 DIAGNOSIS — R262 Difficulty in walking, not elsewhere classified: Secondary | ICD-10-CM | POA: Diagnosis not present

## 2022-08-24 NOTE — Telephone Encounter (Signed)
Pt's daughter dropped off PAF for Eliquis. Placed in nurse box.

## 2022-08-28 DIAGNOSIS — H18413 Arcus senilis, bilateral: Secondary | ICD-10-CM | POA: Diagnosis not present

## 2022-08-28 DIAGNOSIS — Z9889 Other specified postprocedural states: Secondary | ICD-10-CM | POA: Diagnosis not present

## 2022-08-28 DIAGNOSIS — H401133 Primary open-angle glaucoma, bilateral, severe stage: Secondary | ICD-10-CM | POA: Diagnosis not present

## 2022-08-28 DIAGNOSIS — H18221 Idiopathic corneal edema, right eye: Secondary | ICD-10-CM | POA: Diagnosis not present

## 2022-08-28 DIAGNOSIS — Z961 Presence of intraocular lens: Secondary | ICD-10-CM | POA: Diagnosis not present

## 2022-08-29 ENCOUNTER — Ambulatory Visit: Payer: Medicare Other | Attending: Internal Medicine

## 2022-08-29 DIAGNOSIS — R2681 Unsteadiness on feet: Secondary | ICD-10-CM | POA: Diagnosis not present

## 2022-08-29 DIAGNOSIS — M6281 Muscle weakness (generalized): Secondary | ICD-10-CM | POA: Insufficient documentation

## 2022-08-29 DIAGNOSIS — R262 Difficulty in walking, not elsewhere classified: Secondary | ICD-10-CM | POA: Diagnosis not present

## 2022-08-29 NOTE — Therapy (Signed)
OUTPATIENT PHYSICAL THERAPY NEURO TREATMENT NOTE    Patient Name: Sydney Mcdaniel MRN: [id number] DOB:04/05/1925, 89 y.o., female Today's Date: 08/29/2022  PCP: Teodora Medici, DO REFERRING PROVIDER: Teodora Medici, DO    PT End of Session - 08/29/22 0848     Visit Number 31    Number of Visits 37    Date for PT Re-Evaluation 09/19/22    Authorization Time Period 06/27/22-09/19/22    Progress Note Due on Visit 30    PT Start Time 0847    PT Stop Time 0931    PT Time Calculation (min) 44 min    Equipment Utilized During Treatment Gait belt    Activity Tolerance Patient tolerated treatment well;No increased pain    Behavior During Therapy WFL for tasks assessed/performed                Past Medical History:  Diagnosis Date   Allergy    Anemia    Cardiomyopathy ([place])    a. 02/2019 Echo: EF 60-65%, no rwma, doppler parameters consistent w/ pseudonormalization. Nl RV size/fxn; b. 06/2021 Echo: EF 40-45% w/ sev basal-mid antsept HK. GrII DD. Nl RV size/fxn. Mild MR/AI.   Carotid arterial disease ([place])    a. 04/2020 Carotid U/S: 1-39% bilat ICA stenosis, <50% bilat ECA stenosis; b. 06/2021 Carotid U/S: RICA 123456, LICA 123456.   Cataract    CLL (chronic lymphocytic leukemia) (HCC)    GERD (gastroesophageal reflux disease)    Glaucoma    H/O: hysterectomy    Total   History of stress test    a. 02/2019 MV: EF 60%, no ischemia/infarct.   Hyperlipidemia    Hypertension    Hypothyroidism    Impaired fasting glucose    Lichen sclerosus    Osteoporosis    Hips   PAF (paroxysmal atrial fibrillation) ([place])    a. 06/15/2016 Event monitor: 4% afib burden; b. CHA2DS2VASc - 4-->Xarelto.   Sinus bradycardia    a. avoid AVN blocking agents.   Syncope    a. 03/2016 Echo: EF 55-60%, no rwma, mild AI/MR, nl PASP; b. 03/2016 48h Holter: no significant arrhythmias/pauses; c. 03/2016 MV: mild apical defect, likely breast attenuation, nl EF, low risk; d. 05/2016 Event monitor: No  significant arrhythmia; e. 05/2016 Event monitor: PAF (4%).   Past Surgical History:  Procedure Laterality Date   APPENDECTOMY     EYE SURGERY     Glaucoma   MULTIPLE TOOTH EXTRACTIONS N/A    2 teeth   PACEMAKER IMPLANT N/A 09/22/2021   Procedure: PACEMAKER IMPLANT;  Surgeon: Deboraha Sprang, MD;  Location: [facility];  Service: Cardiovascular;  Laterality: N/A;   TOTAL ABDOMINAL HYSTERECTOMY     Patient Active Problem List   Diagnosis Date Noted   HFrEF (heart failure with reduced ejection fraction) ([place]) 05/06/2022   Sick sinus syndrome ([place]) 05/06/2022   General weakness 02/01/2022   Arm pain, anterior, right 01/30/2022   SIADH (syndrome of inappropriate ADH production) ([place]) 07/05/2021   Aspiration pneumonia ([place]) 05/21/2021   Elevated troponin 05/20/2021   Fall 02/11/2021   Normocytic anemia 05/31/2020   Goals of care, counseling/discussion 03/08/2020   Positive direct antiglobulin test (DAT) 12/04/2019   Sinus bradycardia 04/09/2019   First degree AV block 04/09/2019   Osteoporosis 08/21/2018   Bilateral carotid artery stenosis 04/19/2018   History of syncope 10/17/2017   MCI (mild cognitive impairment) 08/12/2017   Lung nodules 07/09/2017   Orthostatic lightheadedness 04/18/2017   Paroxysmal atrial fibrillation ([place])  04/18/2017   Hyponatremia 10/20/2016   Chronic diastolic congestive heart failure ([place]) 06/28/2016   Syncope, near 05/24/2016   Primary open-angle glaucoma, bilateral, mild stage 05/08/2016   Bladder prolapse, female, acquired 02/25/2016   Asymptomatic bacteriuria 02/25/2016   B12 deficiency 01/11/2016   Anemia 01/10/2016   CLL (chronic lymphocytic leukemia) ([place]) 01/10/2016   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 12/21/2015   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 12/21/2015   Bradycardia 12/21/2015   Lymphocytosis 06/25/2015   Impaired fasting glucose 06/22/2015   Carotid atherosclerosis 06/22/2015   Essential hypertension  12/22/2014   Hyperlipidemia 12/22/2014   Hypothyroidism 12/22/2014    ONSET DATE: 01/30/2022  REFERRING DIAG: R26.9 (ICD-10-CM) - Abnormality of gait and mobility  THERAPY DIAG:  Unsteadiness on feet  Muscle weakness (generalized)  Difficulty in walking, not elsewhere classified  Rationale for Evaluation and Treatment Rehabilitation  SUBJECTIVE:   SUBJECTIVE STATEMENT: Pt daughter reports eye medications recently changed at specialist appointment to see if R eye improves. No other concerns/updates  [name]: family member DTR  PERTINENT HISTORY: [name] back in 2022 for weakness, imbalance. She returns for generalized weakness, balance issues and falls following hospitalization 01/30/22-01/31/22 for acute on chronic hyponatremia, near syncope, RUE pain and generalized weakness. Pt biggest concern is her balance. 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 Nope   FALLS: Has patient fallen in last 6 months? Yes. Number of falls 1  PATIENT GOALS: "I want more balance"    TODAY'S TREATMENT: Gait belt donned and CGA provided throughout unless specified otherwise   TherEx:   Staggered STS 8x each LE. Difficult. PT provides up to min asssit  STS 15x. Continued cuing for technique      NMR:   Several repetitions and minutes of the following balance interventions: -Alternating step tap onto 6" step with UUE support and no UE  support - cuing for decreased speed of movement to promote SLB -One foot on floor one on 6" step hold 30-45 sec each LE position, with decreasing to no UE support  -SLB each LE with decreasing to intermittent UE support -Stepping into tandem stance and semi-tandem stance with decreasing to no UE support each LE  -Tandem stance 30-45 sec each LE x multiple rounds  NBOS EO x 30 sec NBOS with vertical and horizontal head turns 10x for each  Comments: Pt requires CGA-min assist with balance interventions. PT provides encouragement as pt continues to be somewhat limited by FOF  PATIENT EDUCATION: Education details: Pt educated throughout session about proper posture and technique with exercises. Improved exercise technique, movement at target joints, use of target muscles after min to mod verbal, visual, tactile cues.   Person educated: Patient and DTR Education method: Explanation, Demonstration, Tactile cues, and Verbal cues Education comprehension: verbalized understanding, returned demonstration, verbal cues required, tactile cues required, and needs further education   HOME EXERCISE PROGRAM: Pt to continue HEP as previously given  Instruction in use of theraputty  Access Code: [email or web address] URL: [email or web address] Date: 04/03/2022 Prepared by: Ricard Dillon  Exercises - Seated  March  - 1 x daily - 5 x weekly - 3 sets - 20 reps - alternating leg hold    GOALS: Goals reviewed with patient? Yes  SHORT TERM GOALS: Target date: 10/10/2022  Patient will be independent in home exercise program to improve strength/mobility for better functional independence with ADLs. Baseline: initiated; 06/27/21: pt HEP to be advanced; 06/29/22: to be advanced, pt DTR reports pt feels comfortable is indep Goal status: MET  LONG TERM GOALS: Target date: 11/21/2022  Patient will increase FOTO score to equal to or greater than  47   to demonstrate statistically significant improvement in  mobility and quality of life.  Baseline: 38 05/25/22: 42; 1/2: deferred; 06/29/22: 44; 07/20/22: 39; 08/24/22: 44% Goal status: PARTIALLY MET  2.  Patient (> 51 years old) will complete five times sit to stand test in < 15 seconds indicating an increased LE strength and improved balance. Baseline: 28 sec use of UE 05/25/22: 19.57 sec use of UE's on knees; 1/2: deferred; `/4/24: 23 sec hands but hands-free; 1/25: 19 sec hands-free; 2/29: 16 seconds hands-free  Goal status: Partially MET  3. Patient will increase 10 meter walk test to >1.44ms as to improve gait speed for better community ambulation and to reduce fall risk.  Baseline: 0.45 m/s w SPC, close CGA 05/25/22: 0.56 m/s w SPC, close CGA; 1/2: deferred; 1/4: 0.45 m/s with SPC close CGA ; 1/25: 0.63 m/s with SPC; 08/24/22: 0.7 m/s with SPC  Goal status: IN PROGRESS  4.   Patient will reduce timed up and go to <15 seconds to reduce fall risk and demonstrate improved transfer/gait ability. Baseline: 32 sec with SPC 05/25/22: 29.19 sec with SPC; 06/27/22: deferred; 06/29/22: 34 seconds with SPC and close CGA; 1/25: 26 sec with SPC, close CGA; 08/24/22: 25 seconds  Goal status: IN PROGRESS  5.   Patient will increase Berg Balance score by > 6 points to demonstrate decreased fall risk during functional activities. Baseline: to be completed next 1-2 sessions; 10/18: 32/56 05/25/22:  37/56; 06/27/22: deferred; 06/29/22: 38/56 ; 1/25: 38/40; 2/29/42: 45/56  Goal status: MET   ASSESSMENT:  CLINICAL IMPRESSION: Majority of session focused on improving balance deficits found with recent goal retesting last appointment. PT provides frequent encouragement throughout due to pt reporting FOF/fear of LOB. Pt generally requires CGA-min a with balance exercises. Pt exhibits improvement in BLE strength with completing increased reps of STS and staggered STS. The pt will continue to benefit from skilled PT services to address deficits and impairment identified in  evaluation in order to maximize independence and safety in basic mobility required for performance of ADL, IADL, and leisure.    OBJECTIVE IMPAIRMENTS Abnormal gait, decreased activity tolerance, decreased balance, decreased cognition, decreased endurance, decreased knowledge of use of DME, decreased mobility, difficulty walking, decreased strength, impaired vision/preception, improper body mechanics, postural dysfunction, and pain.   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   REHAB POTENTIAL: Good  CLINICAL DECISION MAKING: Evolving/moderate  complexity  EVALUATION COMPLEXITY: Moderate  PLAN: PT FREQUENCY: 2x/week  PT DURATION: 12 weeks  PLANNED INTERVENTIONS: Therapeutic exercises, Therapeutic activity, Neuromuscular re-education, Balance training, Gait training, Patient/Family education, Self Care, Joint mobilization, Stair training, Vestibular training, Canalith repositioning, Orthotic/Fit training, DME instructions, Wheelchair mobility training, Spinal mobilization, Cryotherapy, Moist heat, scar mobilization, Splintting, Taping, Ultrasound, Parrafin, Biofeedback, Manual therapy, and Re-evaluation  PLAN FOR NEXT SESSION:  Strength, balance, SPC gait  training, continue plan, LLE balance   9:39 AM, 08/29/22  Physical Therapist - [place] [phone]    Zollie Pee PT  08/29/22, 9:39 AM

## 2022-08-31 ENCOUNTER — Ambulatory Visit: Payer: Medicare Other

## 2022-08-31 DIAGNOSIS — R2681 Unsteadiness on feet: Secondary | ICD-10-CM | POA: Diagnosis not present

## 2022-08-31 DIAGNOSIS — M6281 Muscle weakness (generalized): Secondary | ICD-10-CM

## 2022-08-31 DIAGNOSIS — R262 Difficulty in walking, not elsewhere classified: Secondary | ICD-10-CM

## 2022-08-31 NOTE — Therapy (Signed)
OUTPATIENT PHYSICAL THERAPY NEURO TREATMENT NOTE    Patient Name: Sydney Mcdaniel MRN: [id number] DOB:06-20-1925, 89 y.o., female Today's Date: 08/31/2022  PCP: Teodora Medici, DO REFERRING PROVIDER: Teodora Medici, DO    PT End of Session - 08/31/22 1214     Visit Number 32    Number of Visits 18    Date for PT Re-Evaluation 09/19/22    Authorization Time Period 06/27/22-09/19/22    Progress Note Due on Visit 30    PT Start Time 0848    PT Stop Time 0930    PT Time Calculation (min) 42 min    Equipment Utilized During Treatment Gait belt    Activity Tolerance Patient tolerated treatment well;No increased pain    Behavior During Therapy WFL for tasks assessed/performed                Past Medical History:  Diagnosis Date   Allergy    Anemia    Cardiomyopathy ([place])    a. 02/2019 Echo: EF 60-65%, no rwma, doppler parameters consistent w/ pseudonormalization. Nl RV size/fxn; b. 06/2021 Echo: EF 40-45% w/ sev basal-mid antsept HK. GrII DD. Nl RV size/fxn. Mild MR/AI.   Carotid arterial disease ([employer])    a. 04/2020 Carotid U/S: 1-39% bilat ICA stenosis, <50% bilat ECA stenosis; b. 06/2021 Carotid U/S: RICA 123456, LICA 123456.   Cataract    CLL (chronic lymphocytic leukemia) (HCC)    GERD (gastroesophageal reflux disease)    Glaucoma    H/O: hysterectomy    Total   History of stress test    a. 02/2019 MV: EF 60%, no ischemia/infarct.   Hyperlipidemia    Hypertension    Hypothyroidism    Impaired fasting glucose    Lichen sclerosus    Osteoporosis    Hips   PAF (paroxysmal atrial fibrillation) ([place])    a. 06/15/2016 Event monitor: 4% afib burden; b. CHA2DS2VASc - 4-->Xarelto.   Sinus bradycardia    a. avoid AVN blocking agents.   Syncope    a. 03/2016 Echo: EF 55-60%, no rwma, mild AI/MR, nl PASP; b. 03/2016 48h Holter: no significant arrhythmias/pauses; c. 03/2016 MV: mild apical defect, likely breast attenuation, nl EF, low risk; d. 05/2016 Event monitor: No  significant arrhythmia; e. 05/2016 Event monitor: PAF (4%).   Past Surgical History:  Procedure Laterality Date   APPENDECTOMY     EYE SURGERY     Glaucoma   MULTIPLE TOOTH EXTRACTIONS N/A    2 teeth   PACEMAKER IMPLANT N/A 09/22/2021   Procedure: PACEMAKER IMPLANT;  Surgeon: Deboraha Sprang, MD;  Location: [facility];  Service: Cardiovascular;  Laterality: N/A;   TOTAL ABDOMINAL HYSTERECTOMY     Patient Active Problem List   Diagnosis Date Noted   HFrEF (heart failure with reduced ejection fraction) ([place]) 05/06/2022   Sick sinus syndrome ([place]) 05/06/2022   General weakness 02/01/2022   Arm pain, anterior, right 01/30/2022   SIADH (syndrome of inappropriate ADH production) ([place]) 07/05/2021   Aspiration pneumonia ([place]) 05/21/2021   Elevated troponin 05/20/2021   Fall 02/11/2021   Normocytic anemia 05/31/2020   Goals of care, counseling/discussion 03/08/2020   Positive direct antiglobulin test (DAT) 12/04/2019   Sinus bradycardia 04/09/2019   First degree AV block 04/09/2019   Osteoporosis 08/21/2018   Bilateral carotid artery stenosis 04/19/2018   History of syncope 10/17/2017   MCI (mild cognitive impairment) 08/12/2017   Lung nodules 07/09/2017   Orthostatic lightheadedness 04/18/2017   Paroxysmal atrial fibrillation ([place])  04/18/2017   Hyponatremia 10/20/2016   Chronic diastolic congestive heart failure ([place]) 06/28/2016   Syncope, near 05/24/2016   Primary open-angle glaucoma, bilateral, mild stage 05/08/2016   Bladder prolapse, female, acquired 02/25/2016   Asymptomatic bacteriuria 02/25/2016   B12 deficiency 01/11/2016   Anemia 01/10/2016   CLL (chronic lymphocytic leukemia) ([place]) 01/10/2016   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 12/21/2015   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 12/21/2015   Bradycardia 12/21/2015   Lymphocytosis 06/25/2015   Impaired fasting glucose 06/22/2015   Carotid atherosclerosis 06/22/2015   Essential hypertension  12/22/2014   Hyperlipidemia 12/22/2014   Hypothyroidism 12/22/2014    ONSET DATE: 01/30/2022  REFERRING DIAG: R26.9 (ICD-10-CM) - Abnormality of gait and mobility  THERAPY DIAG:  Muscle weakness (generalized)  Unsteadiness on feet  Difficulty in walking, not elsewhere classified  Rationale for Evaluation and Treatment Rehabilitation  SUBJECTIVE:   SUBJECTIVE STATEMENT: Pt reports no other updates. No aches/pains. No stumbles/falls.  [name]: family member DTR  PERTINENT HISTORY: [name] back in 2022 for weakness, imbalance. She returns for generalized weakness, balance issues and falls following hospitalization 01/30/22-01/31/22 for acute on chronic hyponatremia, near syncope, RUE pain and generalized weakness. Pt biggest concern is her balance. 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 Nope   FALLS: Has patient fallen in last 6 months? Yes. Number of falls 1  PATIENT GOALS: "I want more balance"    TODAY'S TREATMENT: Gait belt donned and CGA provided throughout unless specified otherwise    NMR:  In // bars: several minutes and reps of the following Gait fwd/bckwd length of p bars cuing for step length throughout. Pt uses intermittent UE support Gait fwd/bckwed with horizontal and vertical head turns. Intermittent UE support throughout LTL stepping/gait each direction  Semi-tandem gait - requires intermittent UE support  and up to min assist  Airex: EO and EC alternating 2x30 sec for each, performed in WBOS and NBOS. PT provides up to min assist EO vertical and horizontal head turns 10x of each EO slow marches x multiple reps alternating LE. UUE support>intermittent UE support   TherEx: STS 15x STS LE on airex 4x STS 4x Cuing throughout for technique, "nose over toes"   PATIENT EDUCATION: Education details: Pt educated throughout session about proper posture and technique with exercises. Improved exercise technique, movement at target joints, use of target muscles after min to mod verbal, visual, tactile cues.   Person educated: Patient and DTR Education method: Explanation, Demonstration, Tactile cues, and Verbal cues Education comprehension: verbalized understanding, returned demonstration, verbal cues required, tactile cues required, and needs further education   HOME EXERCISE PROGRAM: Pt to continue HEP as previously given  Instruction in use of theraputty  Access Code: [email or web address] URL: [email or web address] Date: 04/03/2022 Prepared by: Ricard Dillon  Exercises - Seated March  - 1 x daily - 5 x weekly - 3 sets - 20 reps - alternating leg hold    GOALS: Goals reviewed with patient? Yes  SHORT TERM GOALS: Target date: 10/12/2022  Patient will be independent in home exercise  program to improve strength/mobility for better functional independence with ADLs. Baseline: initiated; 06/27/21: pt HEP to be advanced; 06/29/22: to be advanced, pt DTR reports pt feels comfortable is indep Goal status: MET  LONG TERM GOALS: Target date: 11/23/2022  Patient will increase FOTO score to equal to or greater than  47   to demonstrate statistically significant improvement in mobility and quality of life.  Baseline: 38 05/25/22: 42; 1/2: deferred; 06/29/22: 44; 07/20/22: 39; 08/24/22: 44% Goal status: PARTIALLY MET  2.  Patient (> 33 years old) will complete five times sit to stand test in < 15  seconds indicating an increased LE strength and improved balance. Baseline: 28 sec use of UE 05/25/22: 19.57 sec use of UE's on knees; 1/2: deferred; `/4/24: 23 sec hands but hands-free; 1/25: 19 sec hands-free; 2/29: 16 seconds hands-free  Goal status: Partially MET  3. Patient will increase 10 meter walk test to >1.2ms as to improve gait speed for better community ambulation and to reduce fall risk.  Baseline: 0.45 m/s w SPC, close CGA 05/25/22: 0.56 m/s w SPC, close CGA; 1/2: deferred; 1/4: 0.45 m/s with SPC close CGA ; 1/25: 0.63 m/s with SPC; 08/24/22: 0.7 m/s with SPC  Goal status: IN PROGRESS  4.   Patient will reduce timed up and go to <15 seconds to reduce fall risk and demonstrate improved transfer/gait ability. Baseline: 32 sec with SPC 05/25/22: 29.19 sec with SPC; 06/27/22: deferred; 06/29/22: 34 seconds with SPC and close CGA; 1/25: 26 sec with SPC, close CGA; 08/24/22: 25 seconds  Goal status: IN PROGRESS  5.   Patient will increase Berg Balance score by > 6 points to demonstrate decreased fall risk during functional activities. Baseline: to be completed next 1-2 sessions; 10/18: 32/56 05/25/22:  37/56; 06/27/22: deferred; 06/29/22: 38/56 ; 1/25: 38/40; 2/29/42: 45/56  Goal status: MET   ASSESSMENT:  CLINICAL IMPRESSION: Advanced balance training to be performed on airex and resumed dynamic balance in // bars. Pt requires at least intermittent UE support with majority of interventions and up to min assist from PT throughout. The pt will continue to benefit from skilled PT services to address deficits and impairment identified in evaluation in order to maximize independence and safety in basic mobility required for performance of ADL, IADL, and leisure.    OBJECTIVE IMPAIRMENTS Abnormal gait, decreased activity tolerance, decreased balance, decreased cognition, decreased endurance, decreased knowledge of use of DME, decreased mobility, difficulty walking, decreased strength, impaired  vision/preception, improper body mechanics, postural dysfunction, and pain.   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   REHAB POTENTIAL: Good  CLINICAL DECISION MAKING: Evolving/moderate complexity  EVALUATION COMPLEXITY: Moderate  PLAN: PT FREQUENCY: 2x/week  PT DURATION: 12 weeks  PLANNED INTERVENTIONS: Therapeutic exercises, Therapeutic activity, Neuromuscular re-education, Balance training, Gait training, Patient/Family education, Self Care, Joint mobilization, Stair training, Vestibular training, Canalith repositioning, Orthotic/Fit training, DME instructions, Wheelchair mobility training, Spinal mobilization, Cryotherapy, Moist heat, scar mobilization, Splintting, Taping, Ultrasound, Parrafin, Biofeedback, Manual  therapy, and Re-evaluation  PLAN FOR NEXT SESSION:  Strength, balance, SPC gait training, continue plan, LLE balance   12:18 PM, 08/31/22  Physical Therapist - [facility]  Outpatient Physical Therapy- [place] [phone]    Zollie Pee PT  08/31/22, 12:18 PM

## 2022-09-01 NOTE — Telephone Encounter (Signed)
PAF faxed to [facility]

## 2022-09-05 ENCOUNTER — Ambulatory Visit: Payer: Medicare Other

## 2022-09-05 DIAGNOSIS — R2681 Unsteadiness on feet: Secondary | ICD-10-CM | POA: Diagnosis not present

## 2022-09-05 DIAGNOSIS — M6281 Muscle weakness (generalized): Secondary | ICD-10-CM

## 2022-09-05 DIAGNOSIS — R262 Difficulty in walking, not elsewhere classified: Secondary | ICD-10-CM | POA: Diagnosis not present

## 2022-09-05 NOTE — Therapy (Signed)
OUTPATIENT PHYSICAL THERAPY NEURO TREATMENT NOTE    Patient Name: Sydney Mcdaniel MRN: [id number] DOB:May 18, 1925, 89 y.o., female Today's Date: 09/05/2022  PCP: Teodora Medici, DO REFERRING PROVIDER: Teodora Medici, DO    PT End of Session - 09/05/22 1332     Visit Number 33    Number of Visits 46    Date for PT Re-Evaluation 09/19/22    Authorization Time Period 06/27/22-09/19/22    Progress Note Due on Visit 30    PT Start Time 0846    PT Stop Time 0931    PT Time Calculation (min) 45 min    Equipment Utilized During Treatment Gait belt    Activity Tolerance Patient tolerated treatment well;No increased pain    Behavior During Therapy WFL for tasks assessed/performed                Past Medical History:  Diagnosis Date   Allergy    Anemia    Cardiomyopathy ([place])    a. 02/2019 Echo: EF 60-65%, no rwma, doppler parameters consistent w/ pseudonormalization. Nl RV size/fxn; b. 06/2021 Echo: EF 40-45% w/ sev basal-mid antsept HK. GrII DD. Nl RV size/fxn. Mild MR/AI.   Carotid arterial disease ([place])    a. 04/2020 Carotid U/S: 1-39% bilat ICA stenosis, <50% bilat ECA stenosis; b. 06/2021 Carotid U/S: RICA 123456, LICA 123456.   Cataract    CLL (chronic lymphocytic leukemia) (HCC)    GERD (gastroesophageal reflux disease)    Glaucoma    H/O: hysterectomy    Total   History of stress test    a. 02/2019 MV: EF 60%, no ischemia/infarct.   Hyperlipidemia    Hypertension    Hypothyroidism    Impaired fasting glucose    Lichen sclerosus    Osteoporosis    Hips   PAF (paroxysmal atrial fibrillation) ([place])    a. 06/15/2016 Event monitor: 4% afib burden; b. CHA2DS2VASc - 4-->Xarelto.   Sinus bradycardia    a. avoid AVN blocking agents.   Syncope    a. 03/2016 Echo: EF 55-60%, no rwma, mild AI/MR, nl PASP; b. 03/2016 48h Holter: no significant arrhythmias/pauses; c. 03/2016 MV: mild apical defect, likely breast attenuation, nl EF, low risk; d. 05/2016 Event monitor: No  significant arrhythmia; e. 05/2016 Event monitor: PAF (4%).   Past Surgical History:  Procedure Laterality Date   APPENDECTOMY     EYE SURGERY     Glaucoma   MULTIPLE TOOTH EXTRACTIONS N/A    2 teeth   PACEMAKER IMPLANT N/A 09/22/2021   Procedure: PACEMAKER IMPLANT;  Surgeon: Deboraha Sprang, MD;  Location: [facility];  Service: Cardiovascular;  Laterality: N/A;   TOTAL ABDOMINAL HYSTERECTOMY     Patient Active Problem List   Diagnosis Date Noted   HFrEF (heart failure with reduced ejection fraction) ([place]) 05/06/2022   Sick sinus syndrome ([place]) 05/06/2022   General weakness 02/01/2022   Arm pain, anterior, right 01/30/2022   SIADH (syndrome of inappropriate ADH production) ([place]) 07/05/2021   Aspiration pneumonia ([place]) 05/21/2021   Elevated troponin 05/20/2021   Fall 02/11/2021   Normocytic anemia 05/31/2020   Goals of care, counseling/discussion 03/08/2020   Positive direct antiglobulin test (DAT) 12/04/2019   Sinus bradycardia 04/09/2019   First degree AV block 04/09/2019   Osteoporosis 08/21/2018   Bilateral carotid artery stenosis 04/19/2018   History of syncope 10/17/2017   MCI (mild cognitive impairment) 08/12/2017   Lung nodules 07/09/2017   Orthostatic lightheadedness 04/18/2017   Paroxysmal atrial fibrillation ([place])  04/18/2017   Hyponatremia 10/20/2016   Chronic diastolic congestive heart failure ([place]) 06/28/2016   Syncope, near 05/24/2016   Primary open-angle glaucoma, bilateral, mild stage 05/08/2016   Bladder prolapse, female, acquired 02/25/2016   Asymptomatic bacteriuria 02/25/2016   B12 deficiency 01/11/2016   Anemia 01/10/2016   CLL (chronic lymphocytic leukemia) ([place]) 01/10/2016   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 12/21/2015   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 12/21/2015   Bradycardia 12/21/2015   Lymphocytosis 06/25/2015   Impaired fasting glucose 06/22/2015   Carotid atherosclerosis 06/22/2015   Essential hypertension  12/22/2014   Hyperlipidemia 12/22/2014   Hypothyroidism 12/22/2014    ONSET DATE: 01/30/2022  REFERRING DIAG: R26.9 (ICD-10-CM) - Abnormality of gait and mobility  THERAPY DIAG:  Unsteadiness on feet  Muscle weakness (generalized)  Difficulty in walking, not elsewhere classified  Rationale for Evaluation and Treatment Rehabilitation  SUBJECTIVE:   SUBJECTIVE STATEMENT: Pt has another eye appointment next Monday, her daughter says her eye has improved somewhat. Pt reports no falls. Pt reports no aches, pains.  [name]: family member DTR  PERTINENT HISTORY: [name] back in 2022 for weakness, imbalance. She returns for generalized weakness, balance issues and falls following hospitalization 01/30/22-01/31/22 for acute on chronic hyponatremia, near syncope, RUE pain and generalized weakness. Pt biggest concern is her balance. 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 Nope   FALLS: Has patient fallen in last 6 months? Yes. Number of falls 1  PATIENT GOALS: "I want more balance"    TODAY'S TREATMENT: Gait belt donned and CGA provided throughout unless specified otherwise   NMR:   Goal reassessment initiated for pt's second to last visit. Please refer to goal section below for details  Alternating step-tap onto 6" step with decreasing levels of UE support 2x20 alt LE  Step-length intervention performed over 10 meters. Pt  completes several rounds, pt takes from 23 steps to 30 steps to ambulate oer 10 m with SPC, CGA. PT provides cuing throughout for technique. Pt with initial reports of FOF but does exhibit improves step-length and steadiness with reps.    PATIENT EDUCATION: Education details: Pt educated throughout session about proper posture and technique with exercises. Improved exercise technique, movement at target joints, use of target muscles after min to mod verbal, visual, tactile cues.   Person educated: Patient and DTR Education method: Explanation, Demonstration, Tactile cues, and Verbal cues Education comprehension: verbalized understanding, returned demonstration, verbal cues required, tactile cues required, and needs further education   HOME EXERCISE PROGRAM: Pt to continue HEP as previously given  Instruction in use of theraputty  Access Code: [email or web address] URL: [email or web address] Date: 04/03/2022 Prepared by: Ricard Dillon  Exercises - Seated March  - 1 x daily - 5 x weekly - 3 sets - 20 reps - alternating leg hold    GOALS: Goals reviewed with patient? Yes  SHORT TERM GOALS: Target date: 10/17/2022  Patient will be independent in home exercise program to improve strength/mobility for better functional independence with ADLs. Baseline: initiated; 06/27/21: pt HEP to be advanced; 06/29/22: to  be advanced, pt DTR reports pt feels comfortable is indep Goal status: MET  LONG TERM GOALS: Target date: 11/28/2022  Patient will increase FOTO score to equal to or greater than  47   to demonstrate statistically significant improvement in mobility and quality of life.  Baseline: 38 05/25/22: 42; 1/2: deferred; 06/29/22: 44; 07/20/22: 39; 08/24/22: 44% Goal status: PARTIALLY MET  2.  Patient (> 35 years old) will complete five times sit to stand test in < 15 seconds indicating an increased LE strength and improved balance. Baseline: 28 sec use of UE 05/25/22: 19.57 sec use of UE's on  knees; 1/2: deferred; `/4/24: 23 sec hands but hands-free; 1/25: 19 sec hands-free; 2/29: 16 seconds hands-free  09/05/22: 15 seconds Goal status: Partially MET  3. Patient will increase 10 meter walk test to >1.39ms as to improve gait speed for better community ambulation and to reduce fall risk.  Baseline: 0.45 m/s w SPC, close CGA 05/25/22: 0.56 m/s w SPC, close CGA; 1/2: deferred; 1/4: 0.45 m/s with SPC close CGA ; 1/25: 0.63 m/s with SPC; 08/24/22: 0.7 m/s with SPC  Goal status: IN PROGRESS  4.   Patient will reduce timed up and go to <15 seconds to reduce fall risk and demonstrate improved transfer/gait ability. Baseline: [facility]: 29.19 sec with SPC; 06/27/22: deferred; 06/29/22: 34 seconds with SPC and close CGA; 1/25: 26 sec with SPC, close CGA; 08/24/22: 25 seconds; 09/05/2022: 19 seconds Goal status:PARTIALLY MET  5.   Patient will increase Berg Balance score by > 6 points to demonstrate decreased fall risk during functional activities. Baseline: to be completed next 1-2 sessions; 10/18: 32/56 05/25/22:  37/56; 06/27/22: deferred; 06/29/22: 38/56 ; 1/25: 38/40; 2/29/42: 45/56; 49/56 Goal status: MET   ASSESSMENT:  CLINICAL IMPRESSION: Goal reassessment initiated for pt's second to last visit. The pt modestly improved her 5xSTS performance and substantially improved her TUG score, partially meeting goal. This indicates increased BLE power, improved balance, and slight reduction in fall risk. While pt had previously met Berg goal, she further increased her score today, also indicating improved balance. Remaining goals to be addressed next visit include FOTO and 10MWT. The pt will benefit from one more PT session to review discharge recommendations and HEP to continue beyond PT.     OBJECTIVE IMPAIRMENTS Abnormal gait, decreased activity tolerance, decreased balance, decreased cognition, decreased endurance, decreased knowledge of use of DME, decreased mobility, difficulty  walking, decreased strength, impaired vision/preception, improper body mechanics, postural dysfunction, and pain.   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   REHAB POTENTIAL: Good  CLINICAL DECISION MAKING: Evolving/moderate complexity  EVALUATION COMPLEXITY: Moderate  PLAN: PT FREQUENCY: 2x/week  PT DURATION: 12 weeks  PLANNED INTERVENTIONS: Therapeutic exercises, Therapeutic activity, Neuromuscular re-education, Balance training, Gait training, Patient/Family education, Self Care, Joint mobilization, Stair training, Vestibular training, Canalith repositioning, Orthotic/Fit training, DME instructions, Wheelchair mobility training, Spinal mobilization, Cryotherapy, Moist heat, scar mobilization, Splintting, Taping, Ultrasound, Parrafin, Biofeedback, Manual  therapy, and Re-evaluation  PLAN FOR NEXT SESSION:  Discharge visit, complete FOTO, 10MWT, update/review HEP for d/c   1:47 PM, 09/05/22  Physical Therapist - Farley Blackshear PT  09/05/22, 1:47 PM

## 2022-09-06 NOTE — Therapy (Signed)
OUTPATIENT PHYSICAL THERAPY NEURO TREATMENT NOTE/DISCHARGE    Patient Name: Sydney Mcdaniel MRN: [id number] DOB:02-17-1925, 89 y.o., female Today's Date: 09/07/2022  PCP: Teodora Medici, DO REFERRING PROVIDER: Teodora Medici, DO    PT End of Session - 09/07/22 0922     Visit Number 34    Number of Visits 45    Date for PT Re-Evaluation 09/19/22    Authorization Time Period 06/27/22-09/19/22    Progress Note Due on Visit 30    PT Start Time 0929    PT Stop Time 1014    PT Time Calculation (min) 45 min    Equipment Utilized During Treatment Gait belt    Activity Tolerance Patient tolerated treatment well;No increased pain    Behavior During Therapy WFL for tasks assessed/performed                 Past Medical History:  Diagnosis Date   Allergy    Anemia    Cardiomyopathy ([place])    a. 02/2019 Echo: EF 60-65%, no rwma, doppler parameters consistent w/ pseudonormalization. Nl RV size/fxn; b. 06/2021 Echo: EF 40-45% w/ sev basal-mid antsept HK. GrII DD. Nl RV size/fxn. Mild MR/AI.   Carotid arterial disease ([place])    a. 04/2020 Carotid U/S: 1-39% bilat ICA stenosis, <50% bilat ECA stenosis; b. 06/2021 Carotid U/S: RICA 123456, LICA 123456.   Cataract    CLL (chronic lymphocytic leukemia) (HCC)    GERD (gastroesophageal reflux disease)    Glaucoma    H/O: hysterectomy    Total   History of stress test    a. 02/2019 MV: EF 60%, no ischemia/infarct.   Hyperlipidemia    Hypertension    Hypothyroidism    Impaired fasting glucose    Lichen sclerosus    Osteoporosis    Hips   PAF (paroxysmal atrial fibrillation) ([place])    a. 06/15/2016 Event monitor: 4% afib burden; b. CHA2DS2VASc - 4-->Xarelto.   Sinus bradycardia    a. avoid AVN blocking agents.   Syncope    a. 03/2016 Echo: EF 55-60%, no rwma, mild AI/MR, nl PASP; b. 03/2016 48h Holter: no significant arrhythmias/pauses; c. 03/2016 MV: mild apical defect, likely breast attenuation, nl EF, low risk; d. 05/2016 Event  monitor: No significant arrhythmia; e. 05/2016 Event monitor: PAF (4%).   Past Surgical History:  Procedure Laterality Date   APPENDECTOMY     EYE SURGERY     Glaucoma   MULTIPLE TOOTH EXTRACTIONS N/A    2 teeth   PACEMAKER IMPLANT N/A 09/22/2021   Procedure: PACEMAKER IMPLANT;  Surgeon: Deboraha Sprang, MD;  Location: [facility];  Service: Cardiovascular;  Laterality: N/A;   TOTAL ABDOMINAL HYSTERECTOMY     Patient Active Problem List   Diagnosis Date Noted   HFrEF (heart failure with reduced ejection fraction) ([place]) 05/06/2022   Sick sinus syndrome ([place]) 05/06/2022   General weakness 02/01/2022   Arm pain, anterior, right 01/30/2022   SIADH (syndrome of inappropriate ADH production) ([place]) 07/05/2021   Aspiration pneumonia ([place]) 05/21/2021   Elevated troponin 05/20/2021   Fall 02/11/2021   Normocytic anemia 05/31/2020   Goals of care, counseling/discussion 03/08/2020   Positive direct antiglobulin test (DAT) 12/04/2019   Sinus bradycardia 04/09/2019   First degree AV block 04/09/2019   Osteoporosis 08/21/2018   Bilateral carotid artery stenosis 04/19/2018   History of syncope 10/17/2017   MCI (mild cognitive impairment) 08/12/2017   Lung nodules 07/09/2017   Orthostatic lightheadedness 04/18/2017   Paroxysmal atrial fibrillation (  [place]) 04/18/2017   Hyponatremia 10/20/2016   Chronic diastolic congestive heart failure ([place]) 06/28/2016   Syncope, near 05/24/2016   Primary open-angle glaucoma, bilateral, mild stage 05/08/2016   Bladder prolapse, female, acquired 02/25/2016   Asymptomatic bacteriuria 02/25/2016   B12 deficiency 01/11/2016   Anemia 01/10/2016   CLL (chronic lymphocytic leukemia) ([place]) 01/10/2016   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 12/21/2015   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 12/21/2015   Bradycardia 12/21/2015   Lymphocytosis 06/25/2015   Impaired fasting glucose 06/22/2015   Carotid atherosclerosis 06/22/2015   Essential  hypertension 12/22/2014   Hyperlipidemia 12/22/2014   Hypothyroidism 12/22/2014    ONSET DATE: 01/30/2022  REFERRING DIAG: R26.9 (ICD-10-CM) - Abnormality of gait and mobility  THERAPY DIAG:  Unsteadiness on feet  Muscle weakness (generalized)  Difficulty in walking, not elsewhere classified  Rationale for Evaluation and Treatment Rehabilitation  SUBJECTIVE:   SUBJECTIVE STATEMENT: Pt and patient's daughter aware today is discharge.   [name]: family member DTR  PERTINENT HISTORY: [name] back in 2022 for weakness, imbalance. She returns for generalized weakness, balance issues and falls following hospitalization 01/30/22-01/31/22 for acute on chronic hyponatremia, near syncope, RUE pain and generalized weakness. Pt biggest concern is her balance. 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 Nope   FALLS: Has patient fallen in last 6 months? Yes. Number of falls 1  PATIENT GOALS: "I want more balance"    TODAY'S TREATMENT: Gait belt donned and CGA provided throughout unless specified otherwise  10 MWT: first attempt 14 seconds ; 13 seconds second attempt FOTO Review discharge HEP : sit to stand, seated march, standing hip abduction.   Treatment:   Standing march 15x each LE Standing heel raise 15x   Seated:  Hamstring curl 15x each LE  RTB hamstring curl 15x each LE  *stood to patients L due to limited vision on  R  PATIENT EDUCATION: Education details: Pt educated throughout session about proper posture and technique with exercises. Improved exercise technique, movement at target joints, use of target muscles after min to mod verbal, visual, tactile cues.   Person educated: Patient and DTR Education method: Explanation, Demonstration, Tactile cues, and Verbal cues Education comprehension: verbalized understanding, returned demonstration, verbal cues required, tactile cues required, and needs further education   HOME EXERCISE PROGRAM: Pt to continue HEP as previously given  Instruction in use of theraputty  Access Code: [email or web address] URL: [email or web address] Date: 04/03/2022 Prepared by: Ricard Dillon  Exercises - Seated March  - 1 x daily - 5 x weekly - 3 sets - 20 reps - alternating leg hold    GOALS: Goals reviewed with patient? Yes  SHORT TERM GOALS: Target date: 10/19/2022  Patient will be independent in home exercise program to improve strength/mobility for better functional independence with ADLs. Baseline: initiated; 06/27/21: pt HEP to be advanced; 06/29/22: to be advanced, pt DTR reports pt feels comfortable is indep Goal status: MET  LONG TERM GOALS: Target date: 11/30/2022  Patient will increase FOTO score to equal to or greater than  47  to demonstrate statistically significant improvement in mobility and quality of life.  Baseline: 38 05/25/22: 42; 1/2: deferred; 06/29/22: 44; 07/20/22: 39; 08/24/22: 44% 3/14: 46%  Goal status: PARTIALLY MET  2.  Patient (> 61 years old) will complete five times sit to stand test in < 15 seconds indicating an increased LE strength and improved balance. Baseline: 28 sec use of UE 05/25/22: 19.57 sec use of UE's on knees; 1/2: deferred; `/4/24: 23 sec hands but hands-free; 1/25: 19 sec hands-free; 2/29: 16 seconds hands-free  09/05/22: 15 seconds Goal status: Partially MET  3. Patient will increase 10 meter walk test to >1.55ms as to  improve gait speed for better community ambulation and to reduce fall risk.  Baseline: 0.45 m/s w SPC, close CGA 05/25/22: 0.56 m/s w SPC, close CGA; 1/2: deferred; 1/4: 0.45 m/s with SPC close CGA ; 1/25: 0.63 m/s with SPC; [facility]24[facility]: 0.77 m/s  Goal status: IN PROGRESS  4.   Patient will reduce timed up and go to <15 seconds to reduce fall risk and demonstrate improved transfer/gait ability. [facility] 29.19 sec with SPC; 06/27/22: deferred; 06/29/22: 34 seconds with SPC and close CGA; 1/25: 26 sec with SPC, close CGA; 08/24/22: 25 seconds; 09/05/2022: 19 seconds Goal status:PARTIALLY MET  5.   Patient will increase Berg Balance score by > 6 points to demonstrate decreased fall risk during functional activities. Baseline: to be completed next 1-2 sessions; 10/18: 32/56 05/25/22:  37/56; 06/27/22: deferred; 06/29/22: 38/56 ; 1/25: 38/40; 2/29/42: 45/56; 49/56 Goal status: MET   ASSESSMENT:  CLINICAL IMPRESSION: Patient's daughter and patient aware that today is discharge day. Patient demonstrates understanding of HEP and has no further questions. We will be happy to see patient again in the future as needed.    OBJECTIVE IMPAIRMENTS Abnormal gait, decreased activity tolerance, decreased balance, decreased cognition, decreased endurance, decreased knowledge of use of DME, decreased mobility, difficulty walking, decreased strength, impaired vision/preception, improper body mechanics, postural dysfunction, and pain.   ACTIVITY LIMITATIONS squatting, stairs, transfers, and locomotion level  PARTICIPATION LIMITATIONS: meal prep, cleaning, laundry, shopping, community activity, and yard work  PERSONAL FACTORS Age, Fitness, Past/current experiences, Sex, and 3+ comorbidities: Per chart PMH significant for hypertension, hyperlipidemia, chronic lymphocytic leukemia, chronic hyponatremia and SIADH, generalized weakness, hypothyroidism, carotid atherosclerosis,  lymphocytosis, neoplasm of uncertain behavior of skin of ear and nose, B12 deficiency, bladder prolapse, glaucoma, Chronic diastolic congestive heart failure, paroxysmal atria fibrillation, lung nodules, mild cognitive impairment, osteoporosis, first degree AV block, normocytic anemia, falls,   are also affecting patient's functional outcome.   REHAB POTENTIAL: Good  CLINICAL DECISION MAKING: Evolving/moderate complexity  EVALUATION COMPLEXITY: Moderate  PLAN: PT FREQUENCY: 2x/week  PT DURATION: 12 weeks  PLANNED INTERVENTIONS: Therapeutic exercises, Therapeutic activity, Neuromuscular re-education, Balance training, Gait training, Patient/Family education, Self Care, Joint mobilization, Stair training, Vestibular training, Canalith repositioning, Orthotic/Fit training, DME instructions, Wheelchair mobility training, Spinal mobilization, Cryotherapy, Moist heat, scar mobilization, Splintting, Taping, Ultrasound, Parrafin, Biofeedback, Manual therapy, and Re-evaluation  PLAN FOR NEXT SESSION:       Physical Therapist - [phone]   MJanna ArchPT  09/07/22, 12:26 PM

## 2022-09-07 ENCOUNTER — Ambulatory Visit: Payer: Medicare Other

## 2022-09-07 DIAGNOSIS — R2681 Unsteadiness on feet: Secondary | ICD-10-CM | POA: Diagnosis not present

## 2022-09-07 DIAGNOSIS — M6281 Muscle weakness (generalized): Secondary | ICD-10-CM

## 2022-09-07 DIAGNOSIS — R262 Difficulty in walking, not elsewhere classified: Secondary | ICD-10-CM

## 2022-09-08 ENCOUNTER — Other Ambulatory Visit: Payer: Self-pay | Admitting: Internal Medicine

## 2022-09-11 DIAGNOSIS — H18221 Idiopathic corneal edema, right eye: Secondary | ICD-10-CM | POA: Diagnosis not present

## 2022-09-11 DIAGNOSIS — H401133 Primary open-angle glaucoma, bilateral, severe stage: Secondary | ICD-10-CM | POA: Diagnosis not present

## 2022-09-11 DIAGNOSIS — Z961 Presence of intraocular lens: Secondary | ICD-10-CM | POA: Diagnosis not present

## 2022-09-11 DIAGNOSIS — Z9889 Other specified postprocedural states: Secondary | ICD-10-CM | POA: Diagnosis not present

## 2022-09-12 ENCOUNTER — Ambulatory Visit
Admission: RE | Admit: 2022-09-12 | Discharge: 2022-09-12 | Disposition: A | Discharge status: Home / Self Care | Payer: Medicare Other | Source: Ambulatory Visit | Attending: Physician Assistant | Admitting: Physician Assistant

## 2022-09-12 DIAGNOSIS — Z13820 Encounter for screening for osteoporosis: Secondary | ICD-10-CM | POA: Insufficient documentation

## 2022-09-12 DIAGNOSIS — M810 Age-related osteoporosis without current pathological fracture: Secondary | ICD-10-CM | POA: Insufficient documentation

## 2022-09-12 NOTE — Progress Notes (Signed)
The results of your bone mineral testing revealed that you have osteoporosis.  I recommend supplementation with Calcium and Vitamin D to assist with preventing progression of osteoporosis and to help improve bone health. You should strive to get 1000-1200 mg of Calcium per day (supplementing and Calcium found in your regular diet should total this amount per day) You should try to get [id number] IU of Vitamin D per day (supplementing with Vitamin D2 or Vitamin D3 in addition to dietary sources should total this amount per day)  Weight bearing exercises and strength training can also help improve bone health.  Please continue your Fosamax as directed

## 2022-09-13 ENCOUNTER — Ambulatory Visit: Payer: Self-pay | Admitting: *Deleted

## 2022-09-13 NOTE — Patient Outreach (Signed)
  Care Coordination   Follow Up Visit Note   09/13/2022 Name: Sydney Mcdaniel MRN: [id number] DOB: 1924-09-14  Sydney Mcdaniel is a 89 y.o. year old female who sees Teodora Medici, DO for primary care. I spoke with Sydney Mcdaniel, daughter of Sydney Mcdaniel by phone today.  What matters to the patients health and wellness today?  Per daughter, patient is doing well.  Eager to see if she will need eye surgery or not. Denies any urgent concerns, encouraged to contact this care manager with questions.     Goals Addressed             This Visit's Progress    RNCM: Effective Management of health and well being   On track    Care Coordination Interventions: Evaluation of current treatment plan related to hyponatremia and hypertension and patient's adherence to plan as established by provider Advised patient to call the office for changes in conditions, new questions or concerns Provided education to patient re: safety precautions and fall prevention.  PT sessions have been completed, daughter state she is doing well Reviewed medications with patient and discussed adherence and affordability.  Working with pharmacy team for medication assistance.  Per chart, Xarelto was being changed to Eliquis for this reason.  Collaborated with pharmacist on update, he will call daughter directly.  Reviewed scheduled/upcoming provider appointments including PCP on 5/14 Discussed plans with patient for ongoing care management follow up and provided patient with direct contact information for care management team Advised patient to discuss changes in vision with provider History of Glaucoma, will see specialist on 3/18, will follow up again in 4 weeks.  Will know at that time if she will have surgery. Discussed bone density test results, will now be taking calcium and Vitamin D.           SDOH assessments and interventions completed:  No     Care Coordination Interventions:  Yes, provided    Interventions Today    Flowsheet Row Most Recent Value  Chronic Disease   Chronic disease during today's visit Other  [eye issues]  General Interventions   General Interventions Discussed/Reviewed General Interventions Reviewed, Annual Eye Exam, Doctor Visits  Doctor Visits Discussed/Reviewed Specialist, Doctor Visits Reviewed  PCP/Specialist Visits Compliance with follow-up visit        Follow up plan: Follow up call scheduled for 5/16    Encounter Outcome:  Pt. Visit Completed   Valente David, RN, MSN, [place] Care Management Care Management Coordinator [phone]

## 2022-09-18 ENCOUNTER — Ambulatory Visit: Payer: Medicare Other

## 2022-09-19 ENCOUNTER — Other Ambulatory Visit: Payer: Self-pay | Admitting: Internal Medicine

## 2022-09-19 DIAGNOSIS — E039 Hypothyroidism, unspecified: Secondary | ICD-10-CM

## 2022-09-20 NOTE — Telephone Encounter (Signed)
Requested Prescriptions  Pending Prescriptions Disp Refills   levothyroxine (SYNTHROID) 88 MCG tablet [Pharmacy Med Name: Levothyroxine Sodium 88 MCG Oral Tablet] 90 tablet 0    Sig: TAKE 1 TABLET BY MOUTH ON TUESDAYS, [place]     Endocrinology:  Hypothyroid Agents Failed - 09/19/2022  9:24 AM      Failed - TSH in normal range and within 360 days    TSH  Date Value Ref Range Status  03/03/2021 2.05 0.40 - 4.50 mIU/L Final         Passed - Valid encounter within last 12 months    Recent Outpatient Visits           1 month ago Encounter for [employer] annual wellness exam   [facility] Mecum, Dani Gobble, PA-C   4 months ago Hypothyroidism, unspecified type   [facility] Teodora Medici, DO   7 months ago Hyponatremia   [facility] Teodora Medici, DO   10 months ago Ambulatory dysfunction   [facility] Teodora Medici, DO   1 year ago Aspiration pneumonia of right lower lobe, unspecified aspiration pneumonia type [facility])   [facility] Kathrine Haddock, NP       Future Appointments             In 1 month Teodora Medici, [facility], [facility]

## 2022-09-21 DIAGNOSIS — Z950 Presence of cardiac pacemaker: Secondary | ICD-10-CM | POA: Insufficient documentation

## 2022-09-21 DIAGNOSIS — I4819 Other persistent atrial fibrillation: Secondary | ICD-10-CM | POA: Insufficient documentation

## 2022-09-21 DIAGNOSIS — I429 Cardiomyopathy, unspecified: Secondary | ICD-10-CM | POA: Insufficient documentation

## 2022-09-22 ENCOUNTER — Ambulatory Visit (INDEPENDENT_AMBULATORY_CARE_PROVIDER_SITE_OTHER): Payer: Medicare Other

## 2022-09-22 DIAGNOSIS — I495 Sick sinus syndrome: Secondary | ICD-10-CM | POA: Diagnosis not present

## 2022-09-22 LAB — CUP PACEART REMOTE DEVICE CHECK
Battery Remaining Percentage: 90 %
Brady Statistic RA Percent Paced: 35 %
Brady Statistic RV Percent Paced: 23 %
Date Time Interrogation Session: 20240328193455
Implantable Lead Connection Status: 753985
Implantable Lead Connection Status: 753985
Implantable Lead Implant Date: 20230330
Implantable Lead Implant Date: 20230330
Implantable Lead Location: 753859
Implantable Lead Location: 753860
Implantable Lead Model: 377
Implantable Lead Model: 399
Implantable Lead Serial Number: 8000191707
Implantable Lead Serial Number: 8000584009
Implantable Pulse Generator Implant Date: 20230330
Lead Channel Impedance Value: 546 Ohm
Lead Channel Impedance Value: 761 Ohm
Lead Channel Sensing Intrinsic Amplitude: 0.6 mV
Lead Channel Sensing Intrinsic Amplitude: 11.3 mV
Lead Channel Setting Pacing Amplitude: 1.6 V
Lead Channel Setting Pacing Amplitude: 3 V
Lead Channel Setting Pacing Pulse Width: 0.4 ms
Pulse Gen Model: 407145
Pulse Gen Serial Number: 70372674

## 2022-09-26 ENCOUNTER — Ambulatory Visit: Payer: Medicare Other | Attending: Internal Medicine | Admitting: Internal Medicine

## 2022-09-26 ENCOUNTER — Other Ambulatory Visit
Admission: RE | Admit: 2022-09-26 | Discharge: 2022-09-26 | Disposition: A | Discharge status: Home / Self Care | Payer: Medicare Other | Source: Ambulatory Visit | Attending: Internal Medicine | Admitting: Internal Medicine

## 2022-09-26 ENCOUNTER — Encounter: Payer: Self-pay | Admitting: Internal Medicine

## 2022-09-26 ENCOUNTER — Other Ambulatory Visit: Payer: Self-pay | Admitting: Internal Medicine

## 2022-09-26 VITALS — BP 122/62 | HR 51 | Ht 62.0 in | Wt 124.2 lb

## 2022-09-26 DIAGNOSIS — Z950 Presence of cardiac pacemaker: Secondary | ICD-10-CM

## 2022-09-26 DIAGNOSIS — Z79899 Other long term (current) drug therapy: Secondary | ICD-10-CM | POA: Insufficient documentation

## 2022-09-26 DIAGNOSIS — R001 Bradycardia, unspecified: Secondary | ICD-10-CM | POA: Insufficient documentation

## 2022-09-26 DIAGNOSIS — Z87898 Personal history of other specified conditions: Secondary | ICD-10-CM | POA: Insufficient documentation

## 2022-09-26 DIAGNOSIS — I429 Cardiomyopathy, unspecified: Secondary | ICD-10-CM | POA: Insufficient documentation

## 2022-09-26 DIAGNOSIS — I4819 Other persistent atrial fibrillation: Secondary | ICD-10-CM | POA: Insufficient documentation

## 2022-09-26 DIAGNOSIS — E039 Hypothyroidism, unspecified: Secondary | ICD-10-CM

## 2022-09-26 LAB — CBC
HCT: 38.9 % (ref 36.0–46.0)
Hemoglobin: 12.6 g/dL (ref 12.0–15.0)
MCH: 30.5 pg (ref 26.0–34.0)
MCHC: 32.4 g/dL (ref 30.0–36.0)
MCV: 94.2 fL (ref 80.0–100.0)
Platelets: 187 10*3/uL (ref 150–400)
RBC: 4.13 MIL/uL (ref 3.87–5.11)
RDW: 13.8 % (ref 11.5–15.5)
WBC: 7.8 10*3/uL (ref 4.0–10.5)
nRBC: 0 % (ref 0.0–0.2)

## 2022-09-26 LAB — BASIC METABOLIC PANEL
Anion gap: 7 (ref 5–15)
BUN: 17 mg/dL (ref 8–23)
CO2: 25 mmol/L (ref 22–32)
Calcium: 9.5 mg/dL (ref 8.9–10.3)
Chloride: 101 mmol/L (ref 98–111)
Creatinine, Ser: 0.89 mg/dL (ref 0.44–1.00)
GFR, Estimated: 60 mL/min — ABNORMAL LOW (ref >60)
Glucose, Bld: 112 mg/dL — ABNORMAL HIGH (ref 70–99)
Potassium: 4.2 mmol/L (ref 3.5–5.1)
Sodium: 133 mmol/L — ABNORMAL LOW (ref 135–145)

## 2022-09-26 LAB — PACEMAKER DEVICE OBSERVATION

## 2022-09-26 MED ORDER — AMLODIPINE BESYLATE 2.5 MG PO TABS: 2.5000 mg | ORAL_TABLET | Freq: Every day | ORAL | 3 refills | Status: DC

## 2022-09-26 MED ORDER — LISINOPRIL 10 MG PO TABS: ORAL_TABLET | ORAL | 3 refills | Status: DC

## 2022-09-26 NOTE — Patient Instructions (Signed)
Medication Instructions:  Your physician has recommended you make the following change in your medication:   **  Stop Amlodipine 5mg   ** Start Amlodipine 2.5mg  - 1 tablet by mouth daily.  ** Stop Lisinopril 40mg   ** Start Lisinopril 10mg  - 1 tablet by mouth as needed for BP greater than 160.  *If you need a refill on your cardiac medications before your next appointment, please call your pharmacy*   Lab Work: CBC and BMET today If you have labs (blood work) drawn today and your tests are completely normal, you will receive your results only by: [place] (if you have MyChart) OR A paper copy in the mail If you have any lab test that is abnormal or we need to change your treatment, we will call you to review the results.   Testing/Procedures: None ordered.    Follow-Up: At [facility], you and your health needs are our priority.  As part of our continuing mission to provide you with exceptional heart care, we have created designated Provider Care Teams.  These Care Teams include your primary Cardiologist (physician) and Advanced Practice Providers (APPs -  Physician Assistants and Nurse Practitioners) who all work together to provide you with the care you need, when you need it.  We recommend signing up for the patient portal called "MyChart".  Sign up information is provided on this After Visit Summary.  MyChart is used to connect with patients for Virtual Visits (Telemedicine).  Patients are able to view lab/test results, encounter notes, upcoming appointments, etc.  Non-urgent messages can be sent to your provider as well.   To learn more about what you can do with MyChart, go to [email or web address].    Your next appointment:   12 months with Dr Caryl Comes

## 2022-09-26 NOTE — Progress Notes (Signed)
Patient Care Team: Teodora Medici, DO as PCP - General (Internal Medicine) End, Harrell Gave, MD as PCP - Cardiology (Cardiology) Anell Barr, [place] (Optometry) End, Harrell Gave, MD as Consulting Physician (Cardiology) Anthonette Legato, MD (Nephrology) Sindy Guadeloupe, MD as Consulting Physician (Oncology) Vanita Ingles, RN as Registered Nurse (General Practice) Vanita Ingles, RN as Case Manager ([place]) Valente David, RN as [place] Management Germaine Pomfret, [facility] as Pharmacist (Pharmacist)   HPI  Sydney Mcdaniel is a 89 y.o. 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  Also has atrial fibrillation paroxysmal anticoagulated with rivaroxaban; no bleeding  Hypertension and orthostatic hypotension for which she takes midodrine and abdominal binder    She is much improved following pacing with far fewer weak spells.  There have been 2 interval weak spells, both of which correlated with atrial fibrillation;  she comes in again with complaints of weaknes restarted on Friday.  Daughter comes in with a long blood pressure log, blood pressures were in the 110/20 range over the last 3 to 4 days compared to 140 range previously.  Has had issues of hypertension, was recently given amlodipine to take on an as-needed basis up on her spironolactone and lisinopril background.   DATE TEST EF    9/20 Echo  60-65%     9/20 Myoview   No ischemia   1/23 Echo   40-45 % Basilar anteroseptal severe HK             Date Cr K Hgb  2/23 1.01 4.4 11.2 <<8.9  8/23 0.99 5.0 11.1     Records and Results Reviewed   Past Medical History:  Diagnosis Date   Allergy    Anemia    Cardiomyopathy    a. 02/2019 Echo: EF 60-65%, no rwma, doppler parameters consistent w/ pseudonormalization. Nl RV size/fxn; b. 06/2021 Echo: EF 40-45% w/ sev basal-mid antsept HK. GrII DD. Nl RV size/fxn. Mild MR/AI.    Carotid arterial disease    a. 04/2020 Carotid U/S: 1-39% bilat ICA stenosis, <50% bilat ECA stenosis; b. 06/2021 Carotid U/S: RICA 123456, LICA 123456.   Cataract    CLL (chronic lymphocytic leukemia)    GERD (gastroesophageal reflux disease)    Glaucoma    H/O: hysterectomy    Total   History of stress test    a. 02/2019 MV: EF 60%, no ischemia/infarct.   Hyperlipidemia    Hypertension    Hypothyroidism    Impaired fasting glucose    Lichen sclerosus    Osteoporosis    Hips   PAF (paroxysmal atrial fibrillation)    a. 06/15/2016 Event monitor: 4% afib burden; b. CHA2DS2VASc - 4-->Xarelto.   Sinus bradycardia    a. avoid AVN blocking agents.   Syncope    a. 03/2016 Echo: EF 55-60%, no rwma, mild AI/MR, nl PASP; b. 03/2016 48h Holter: no significant arrhythmias/pauses; c. 03/2016 MV: mild apical defect, likely breast attenuation, nl EF, low risk; d. 05/2016 Event monitor: No significant arrhythmia; e. 05/2016 Event monitor: PAF (4%).    Past Surgical History:  Procedure Laterality Date   APPENDECTOMY     EYE SURGERY     Glaucoma   MULTIPLE TOOTH EXTRACTIONS N/A    2 teeth   PACEMAKER IMPLANT N/A 09/22/2021   Procedure: PACEMAKER IMPLANT;  Surgeon: Deboraha Sprang, MD;  Location: [facility];  Service: Cardiovascular;  Laterality: N/A;  TOTAL ABDOMINAL HYSTERECTOMY      Current Meds  Medication Sig   acetaminophen (TYLENOL) 500 MG tablet Take 500 mg by mouth every 8 (eight) hours as needed for moderate pain.   alendronate (FOSAMAX) 70 MG tablet TAKE 1 TABLET BY MOUTH EVERY FRIDAY ON AN EMPTY STOMACH WITH A FULL GLASS OF WATER   amLODipine (NORVASC) 5 MG tablet Take 0.5 tablets (2.5 mg total) by mouth daily. Hold for a blood pressure less than [id number] systolic (top number)   atorvastatin (LIPITOR) 20 MG tablet TAKE 1 TABLET BY MOUTH ONCE DAILY IN THE EVENING   Calcium Carbonate-Vit D-Min (CALCIUM 1200 PO) Take by mouth. daily   Cholecalciferol 25 MCG (1000 UT) tablet Take by  mouth.   ferrous sulfate 325 (65 FE) MG tablet Take 2 tablets (650 mg total) by mouth daily with breakfast.   LATANOPROST OP Place 1 drop into the right eye at bedtime.   levothyroxine (EUTHYROX) 75 MCG tablet TAKE ONE TABLET BY MOUTH ON MONDAYS, WEDNESDAYS, FRIDAYS, AND SUNDAYS   levothyroxine (SYNTHROID) 88 MCG tablet TAKE 1 TABLET BY MOUTH ON TUESDAYS, THURSDAYS AND SATURDAYS   lisinopril (ZESTRIL) 40 MG tablet Take 1 tablet (40 mg total) by mouth daily.   ondansetron (ZOFRAN) 8 MG tablet Take 8 mg by mouth as needed.   polyethylene glycol (MIRALAX / GLYCOLAX) 17 g packet Take 17 g by mouth daily.   Rivaroxaban (XARELTO) 15 MG TABS tablet Take 15 mg by mouth 2 (two) times daily with a meal.   sodium chloride (MURO 128) 5 % ophthalmic solution Place 1 drop into the right eye in the morning, at noon, and at bedtime.   sodium chloride 1 g tablet Take 1 g by mouth. Takes 1/2 tablet with breakfast, 1/2 tablet with lunch, full tablet at supper   spironolactone (ALDACTONE) 25 MG tablet Take 1 tablet by mouth once daily   vitamin B-12 (CYANOCOBALAMIN) 100 MCG tablet Take 100 mcg by mouth daily.   [DISCONTINUED] prednisoLONE acetate (PRED FORTE) 1 % ophthalmic suspension Place 1 drop into the right eye in the morning, at noon, and at bedtime.   [DISCONTINUED] TIMOLOL MALEATE PF OP Place 1 drop into the right eye in the morning and at bedtime.    Allergies  Allergen Reactions   Alphagan [Brimonidine] Itching and Rash   Pravachol [Pravastatin Sodium] Itching and Rash      Review of Systems negative except from HPI and PMH  Physical Exam BP 122/62 (Patient Position: Standing)   Pulse (!) 51   Ht 5\' 2"  (1.575 m)   Wt 124 lb 3.2 oz (56.3 kg)   LMP  (LMP Unknown)   SpO2 99%   BMI 22.72 kg/m  Well developed and well nourished in no acute distress HENT normal Neck supple with JVP-flat Clear Device pocket well healed; without hematoma or erythema.  There is no tethering  Regular rate and  rhythm, no  murmur Abd-soft with active BS No Clubbing cyanosis  edema Skin-warm and dry A & Oriented  Grossly normal sensory and motor function  ECG atrial fibrillation with intermittent ventricular pacing  Device function is normal. Programming changes none See Paceart for details    CrCl cannot be calculated (Patient's most recent lab result is older than the maximum 21 days allowed.).   Assessment and  Plan  Syncope    Hypertension/orthostatic hypotension  \Weak spells  Atrial fib persistent  Sinus bradycardia   Cardiomyopathy-new with distinct wall motion abnormalities    Pacemaker-Biotronik  No interval syncope.  No complaints of lightheadedness, symptoms markedly improved following pacemaker implantation.  Atrial fibrillation is now persistent, no clearly attributable symptoms so we will not attempt to restore sinus rhythm.  If recurrence has been over weeks and so is unlikely related to the symptoms of weakness which she describes previously, and I wonder if they are more likely related to relatively low blood pressure.  Hence, we will change her blood pressure regime, we will continue her on spironolactone and we will check a metabolic profile today.  We will discontinue her as needed amlodipine given its very long half-life (40 hours or so) and use it as a background low-dose chronic medication at 2.5 mg daily.  She will take her lisinopril and use a 10 mg as needed for blood pressures over 160.  Given the fact that her I think her greater risk is low blood pressure compared to high blood pressure at this age, we will allow her blood pressure systolic to drift to the [id number] range so as to avoid weakness and potential falls.  No bleeding, we will continue the Xarelto, she is trying to get on apixaban with which recommendation I concur     Current medicines are reviewed at length with the patient today .  The patient does not  have concerns regarding medicines.

## 2022-09-27 ENCOUNTER — Encounter: Payer: Self-pay | Admitting: Internal Medicine

## 2022-09-27 NOTE — Telephone Encounter (Signed)
Requested Prescriptions  Pending Prescriptions Disp Refills   levothyroxine (SYNTHROID) 88 MCG tablet [Pharmacy Med Name: Levothyroxine Sodium 88 MCG Oral Tablet] 39 tablet 0    Sig: TAKE 1 TABLET BY MOUTH ON TUESDAYS, [place]     Endocrinology:  Hypothyroid Agents Failed - 09/26/2022  7:41 PM      Failed - TSH in normal range and within 360 days    TSH  Date Value Ref Range Status  03/03/2021 2.05 0.40 - 4.50 mIU/L Final         Passed - Valid encounter within last 12 months    Recent Outpatient Visits           2 months ago Encounter for [employer] annual wellness exam   [facility] Mecum, Dani Gobble, PA-C   4 months ago Hypothyroidism, unspecified type   [facility] Teodora Medici, DO   7 months ago Hyponatremia   [facility] Teodora Medici, DO   10 months ago Ambulatory dysfunction   [facility] Teodora Medici, DO   1 year ago Aspiration pneumonia of right lower lobe, unspecified aspiration pneumonia type [facility])   [facility] Kathrine Haddock, NP       Future Appointments             In 1 month Teodora Medici, [facility], [facility]

## 2022-09-28 ENCOUNTER — Telehealth: Payer: Self-pay

## 2022-09-28 NOTE — Telephone Encounter (Signed)
Caller is following up on patient's application for Eliquis from [facility].  Caller stated patients document was submitted to cardiologist for signature and forwarding but [facility] stated they did not receive the application and the patient is not registered in their system.  Caller stated patient only has 2 weeks worth of Xarelto medication left and wants to switch to Eliquis.

## 2022-09-28 NOTE — Telephone Encounter (Signed)
PAF re-faxed to [facility]. Aniceto Boss made aware

## 2022-09-28 NOTE — Progress Notes (Signed)
Care Management & Coordination Services Pharmacy Team Pharmacy Assistant   Name: AMEERAH INSANA  MRN: [id number] DOB: [birth year]  Reason for Encounter: Hypertension/CHF  Contacted patient to discuss hypertension disease state. Spoke with family on 09/28/2022   Chart Updates: Recent office visits:  None ID  Recent consult visits:  09/26/2022 Virl Axe, MD (Cardiology) for Follow-up- Stopped: Prednisolone, Timolol, Changed: Amlodipine 2.5 mg hold for BP less than [id number] systolic to 2.5 mg daily, Lisinopril 40 mg daily decreased to 10 mg 1 tablet daily prn for systolic BP > 0000000, Lab orders placed, Patient to follow-up in 1 year  09/07/2022 Janna Arch, PT (Physical Therapy) for PT Treatment- No medication changes noted, No orders placed, No follow-up noted  09/05/2022 Ricard Dillon, PT (Physical Therapy) for PT Treatment- No medication changes noted, No orders placed, No follow-up noted  08/31/2022 Ricard Dillon, PT (Physical Therapy) for PT Treatment- No medication changes noted, No orders placed, No follow-up noted  08/29/2022 Ricard Dillon, PT (Physical Therapy) for PT Treatment- No medication changes noted, No orders placed, No follow-up noted  08/24/2022 Ricard Dillon, PT (Physical Therapy) for PT Treatment- No medication changes noted, No orders placed, No follow-up noted  08/23/2022 Munsoor Lateef, MD (Nephrology) for Follow-up- She was taken off of midodrine and she has been given amlodipine 2.5 mg to be taken for systolic blood pressure greater than 140. Lab orders placed, Patient to follow-up in 6 months  08/22/2022 Ricard Dillon, PT (Physical Therapy) for PT Treatment- No medication changes noted, No orders placed, No follow-up noted  08/17/2022 Rebbeca Paul, PT (Physical Therapy) for PT Treatment- No medication changes noted, No orders placed, No follow-up noted  08/15/2022 Ricard Dillon, PT (Physical Therapy) for PT Treatment- No medication changes noted, No orders placed, No  follow-up noted  08/10/2022 Ricard Dillon, PT (Physical Therapy) for PT Treatment- No medication changes noted, No orders placed, No follow-up noted  08/08/2022 Ricard Dillon, PT (Physical Therapy) for PT Treatment- No medication changes noted, No orders placed, No follow-up noted  08/03/2022 Rebbeca Paul, PT (Physical Therapy) for PT Treatment- No medication changes noted, No orders placed, No follow-up noted  Hospital visits:  None in previous 6 months  Medications: Outpatient Encounter Medications as of 09/28/2022  Medication Sig   acetaminophen (TYLENOL) 500 MG tablet Take 500 mg by mouth every 8 (eight) hours as needed for moderate pain.   alendronate (FOSAMAX) 70 MG tablet TAKE 1 TABLET BY MOUTH EVERY FRIDAY ON AN EMPTY STOMACH WITH A FULL GLASS OF WATER   amLODipine (NORVASC) 2.5 MG tablet Take 1 tablet (2.5 mg total) by mouth daily.   apixaban (ELIQUIS) 2.5 MG TABS tablet Take 2.5 mg by mouth 2 (two) times daily. (Patient not taking: Reported on 09/26/2022)   atorvastatin (LIPITOR) 20 MG tablet TAKE 1 TABLET BY MOUTH ONCE DAILY IN THE EVENING   Calcium Carbonate-Vit D-Min (CALCIUM 1200 PO) Take by mouth. daily   Cholecalciferol 25 MCG (1000 UT) tablet Take by mouth.   ferrous sulfate 325 (65 FE) MG tablet Take 2 tablets (650 mg total) by mouth daily with breakfast.   LATANOPROST OP Place 1 drop into the right eye at bedtime.   levothyroxine (EUTHYROX) 75 MCG tablet TAKE ONE TABLET BY MOUTH ON MONDAYS, WEDNESDAYS, FRIDAYS, AND SUNDAYS   levothyroxine (SYNTHROID) 88 MCG tablet TAKE 1 TABLET BY MOUTH ON TUESDAYS, THURSDAYS AND SATURDAYS   lisinopril (ZESTRIL) 10 MG tablet Take 1 tablet by mouth daily as needed for systolic BP > than 0000000.  ondansetron (ZOFRAN) 8 MG tablet Take 8 mg by mouth as needed.   polyethylene glycol (MIRALAX / GLYCOLAX) 17 g packet Take 17 g by mouth daily.   Rivaroxaban (XARELTO) 15 MG TABS tablet Take 15 mg by mouth 2 (two) times daily with a meal.   sodium  chloride (MURO 128) 5 % ophthalmic solution Place 1 drop into the right eye in the morning, at noon, and at bedtime.   sodium chloride 1 g tablet Take 1 g by mouth. Takes 1/2 tablet with breakfast, 1/2 tablet with lunch, full tablet at supper   spironolactone (ALDACTONE) 25 MG tablet Take 1 tablet by mouth once daily   vitamin B-12 (CYANOCOBALAMIN) 100 MCG tablet Take 100 mcg by mouth daily.   No facility-administered encounter medications on file as of 09/28/2022.    Recent Office Vitals: BP Readings from Last 3 Encounters:  09/26/22 122/62  08/02/22 132/60  07/18/22 (!) 142/72   Pulse Readings from Last 3 Encounters:  09/26/22 (!) 51  07/18/22 73  06/23/22 80    Wt Readings from Last 3 Encounters:  09/26/22 124 lb 3.2 oz (56.3 kg)  07/18/22 129 lb 6.4 oz (58.7 kg)  06/23/22 125 lb (56.7 kg)     Kidney Function Lab Results  Component Value Date/Time   CREATININE 0.89 09/26/2022 10:37 AM   CREATININE 0.99 (H) 02/02/2022 12:14 PM   CREATININE 0.80 01/31/2022 01:55 AM   CREATININE 0.79 05/05/2021 11:01 AM   GFRNONAA 60 (L) 09/26/2022 10:37 AM   GFRNONAA 65 10/01/2020 11:24 AM   GFRAA 75 10/01/2020 11:24 AM       Latest Ref Rng & Units 09/26/2022   10:37 AM 02/02/2022   12:14 PM 01/31/2022    1:55 AM  BMP  Glucose 70 - 99 mg/dL 112  95  100   BUN 8 - 23 mg/dL 17  16  13    Creatinine 0.44 - 1.00 mg/dL 0.89  0.99  0.80   BUN/Creat Ratio 6 - 22 (calc)  16    Sodium 135 - 145 mmol/L 133  131  131   Potassium 3.5 - 5.1 mmol/L 4.2  5.0  4.5   Chloride 98 - 111 mmol/L 101  99  101   CO2 22 - 32 mmol/L 25  24  23    Calcium 8.9 - 10.3 mg/dL 9.5  9.4  9.4    Current antihypertensive regimen:  Amlodipine 2.5 mg daily  Lisinopril 10 mg 1 tablet daily prn for systolic BP > 0000000, Lab orders placed Spironolactone 25 mg daily    Patient verbally confirms she is taking the above medications as directed. Yes although per patient's daughter she just started taking the medication with the  new changes so she isn't sure if the changes are working. She stated patient wasn't feeling to well yesterday, but is feeling much better this morning. Per daughter the patient's Cardiologist is okay with her BP being a little on the elevated side as the patient has a tendency to pass out when her BP is low.   How often are you checking your Blood Pressure? twice daily  she checks her blood pressure in the morning and in the evening before and after taking her medication.  Current home BP readings: 122/62  Wrist or arm cuff: Patient uses a arm cuff  Any readings above 180/100? No  What recent interventions/DTPs have been made by any provider to improve Blood Pressure control since last CPP Visit:  Amlodipine 2.5 mg hold  for BP less than [id number] systolic to 2.5 mg daily, Lisinopril 40 mg daily decreased to 10 mg 1 tablet daily prn for systolic BP > 0000000  Any recent hospitalizations or ED visits since last visit with CPP? No  What diet changes have been made to improve Blood Pressure Control?  Patient had some dental work done a few months ago so she isn't eating as much as she should. Per the patient's daughter she hasn't gained any weight she is on the losing side but they are doing what they can to assist her with gaining some of her weight back and to make sure she doesn't keep losing weight.    Daughter denies any swelling or any other issues at this time. The patient's daughter did state that they have not heard back regarding the patient assistance for the patient's Eliquis. Patient Assistance application was started with [place] and Dr. Darnelle Bos office faxed it over according to the notes on 03/08 to [facility]. Per patient's daughter she contacted the office to request they follow-up and was advised it can take 4-6 weeks but someone would follow-up and give her a call back.   The patient's daughter advised she never received a call back regarding the status of the application. I informed her I  would contact [facility] to see if they would provide me with a status and I also informed her that it does not take 4-6 weeks to get approved once [facility] has all documentation required. Per daughter patient only has 2 weeks of Xarelto left and the reason they are switching is due to the cost of the Xarelto. If they have to purchase the Xarelto again they are hoping to only have to do 30-DS and by that time the patient hopefully would be approved for Eliquis so she can switch.   I contacted [facility] and was informed by the representative that they have not received the application for the patient as there is no profile for the patient in their system. I contacted Dr. Darnelle Bos Office and informed them of the issue and requested that someone refax application today and please follow-up to make sure that the application was successfully received. The representative with Dr. Darnelle Bos office took the message and stated someone would call me back regarding the issue.  I did update patient and her daughter of the issue. I also updated CPP regarding the issue as well.  Adherence Review: Is the patient currently on ACE/ARB medication? Yes Does the patient have >5 day gap between last estimated fill dates? No  Star Rating Drugs:  Atorvastatin 20 mg last filled on 07/21/2022 for a 90-DS with Loraine Lisinopril 10 mg last filled on 09/26/2022 for a 90-DS with [place], [place] [facility] Pharmacist Assistant Phone: [phone]

## 2022-10-03 NOTE — Telephone Encounter (Signed)
Received fax from [facility] Patient Assistance stating the following.  We regret to infom you that your patient in not eligible to receive Eliquis for the following reason(s):  A letter regarding this decision has been sent to your patient.  Per DPR, pt's daughter made aware and advise to contact foundation.  Daughter verbalized understanding.

## 2022-10-04 ENCOUNTER — Telehealth: Payer: Self-pay

## 2022-10-04 NOTE — Progress Notes (Signed)
Care Coordination Pharmacy Assistant   Name: Sydney Mcdaniel  MRN: [id number] DOB: 1925-02-22  Reason for Encounter: Patient Assistance for Eliquis   I spoke with Alver Fisher and per the representative the patient's application was denied due to her not submitting a denial letter from social security Low Income Subsidy. The patient would need to apply to social security low income subsidy program first and get a denial. If the patient does get a denial then they would have to fax over that denial letter to [facility] in order for the patient to be approved.  I spoke with the patient's daughter and advised her of the above and she verbalized understanding. Per patient's daughter they do not want to go through that process at this time. I informed if they do change their mind I can assist with the process.  Medications: Outpatient Encounter Medications as of 10/04/2022  Medication Sig   acetaminophen (TYLENOL) 500 MG tablet Take 500 mg by mouth every 8 (eight) hours as needed for moderate pain.   alendronate (FOSAMAX) 70 MG tablet TAKE 1 TABLET BY MOUTH EVERY FRIDAY ON AN EMPTY STOMACH WITH A FULL GLASS OF WATER   amLODipine (NORVASC) 2.5 MG tablet Take 1 tablet (2.5 mg total) by mouth daily.   apixaban (ELIQUIS) 2.5 MG TABS tablet Take 2.5 mg by mouth 2 (two) times daily. (Patient not taking: Reported on 09/26/2022)   atorvastatin (LIPITOR) 20 MG tablet TAKE 1 TABLET BY MOUTH ONCE DAILY IN THE EVENING   Calcium Carbonate-Vit D-Min (CALCIUM 1200 PO) Take by mouth. daily   Cholecalciferol 25 MCG (1000 UT) tablet Take by mouth.   ferrous sulfate 325 (65 FE) MG tablet Take 2 tablets (650 mg total) by mouth daily with breakfast.   LATANOPROST OP Place 1 drop into the right eye at bedtime.   levothyroxine (EUTHYROX) 75 MCG tablet TAKE ONE TABLET BY MOUTH ON MONDAYS, WEDNESDAYS, FRIDAYS, AND SUNDAYS   levothyroxine (SYNTHROID) 88 MCG tablet TAKE 1 TABLET BY MOUTH ON TUESDAYS, THURSDAYS AND  SATURDAYS   lisinopril (ZESTRIL) 10 MG tablet Take 1 tablet by mouth daily as needed for systolic BP > than 160.   ondansetron (ZOFRAN) 8 MG tablet Take 8 mg by mouth as needed.   polyethylene glycol (MIRALAX / GLYCOLAX) 17 g packet Take 17 g by mouth daily.   Rivaroxaban (XARELTO) 15 MG TABS tablet Take 15 mg by mouth 2 (two) times daily with a meal.   sodium chloride (MURO 128) 5 % ophthalmic solution Place 1 drop into the right eye in the morning, at noon, and at bedtime.   sodium chloride 1 g tablet Take 1 g by mouth. Takes 1/2 tablet with breakfast, 1/2 tablet with lunch, full tablet at supper   spironolactone (ALDACTONE) 25 MG tablet Take 1 tablet by mouth once daily   vitamin B-12 (CYANOCOBALAMIN) 100 MCG tablet Take 100 mcg by mouth daily.   No facility-administered encounter medications on file as of 10/04/2022.    Adelene Idler, CPA/CMA Clinical Pharmacist Assistant Phone: [phone]

## 2022-10-05 ENCOUNTER — Encounter: Payer: Self-pay | Admitting: Internal Medicine

## 2022-10-05 ENCOUNTER — Other Ambulatory Visit: Payer: Self-pay

## 2022-10-05 MED ORDER — RIVAROXABAN 20 MG PO TABS: 20.0000 mg | ORAL_TABLET | Freq: Every day | ORAL | 0 refills | Status: DC

## 2022-10-05 NOTE — Telephone Encounter (Signed)
Most recent labs show GFR of 60.  Based on this, please send a prescription for rivaroxaban 20 mg daily to Ms. Seawood's pharmacy so that we can see what her out of pocket cost would be.  Thanks.   Caedence Snowden, MD [facility] 

## 2022-10-05 NOTE — Progress Notes (Signed)
Most recent labs show GFR of 60.  Based on this, please send a prescription for rivaroxaban 20 mg daily to Sydney Mcdaniel's pharmacy so that we can see what her out of pocket cost would be.  Thanks.   [name], MD [facility]

## 2022-10-06 MED ORDER — APIXABAN 2.5 MG PO TABS: 2.5000 mg | ORAL_TABLET | Freq: Two times a day (BID) | ORAL | 1 refills | Status: DC

## 2022-10-09 DIAGNOSIS — Z9889 Other specified postprocedural states: Secondary | ICD-10-CM | POA: Diagnosis not present

## 2022-10-09 DIAGNOSIS — H18221 Idiopathic corneal edema, right eye: Secondary | ICD-10-CM | POA: Diagnosis not present

## 2022-10-09 DIAGNOSIS — Z961 Presence of intraocular lens: Secondary | ICD-10-CM | POA: Diagnosis not present

## 2022-10-09 DIAGNOSIS — H401133 Primary open-angle glaucoma, bilateral, severe stage: Secondary | ICD-10-CM | POA: Diagnosis not present

## 2022-10-24 NOTE — Progress Notes (Signed)
Remote pacemaker transmission.   

## 2022-11-03 ENCOUNTER — Other Ambulatory Visit: Payer: Self-pay | Admitting: Internal Medicine

## 2022-11-03 DIAGNOSIS — E782 Mixed hyperlipidemia: Secondary | ICD-10-CM

## 2022-11-03 NOTE — Telephone Encounter (Signed)
Requested medication (s) are due for refill today - yes  Requested medication (s) are on the active medication list -yes  Future visit scheduled -yes  Last refill: 07/21/22 #90   Notes to clinic: fails lab protocol- over 1 year  Requested Prescriptions  Pending Prescriptions Disp Refills   atorvastatin (LIPITOR) 20 MG tablet [Pharmacy Med Name: Atorvastatin Calcium 20 MG Oral Tablet] 90 tablet 0    Sig: TAKE 1 TABLET BY MOUTH ONCE DAILY IN THE EVENING     Cardiovascular:  Antilipid - Statins Failed - 11/03/2022  9:40 AM      Failed - Lipid Panel in normal range within the last 12 months    Cholesterol, Total  Date Value Ref Range Status  11/17/2020 171 100 - 199 mg/dL Final   LDL Cholesterol (Calc)  Date Value Ref Range Status  07/02/2018 90 mg/dL (calc) Final    Comment:    Reference range: <100 . Desirable range <100 mg/dL for primary prevention;   <70 mg/dL for patients with CHD or diabetic patients  with > or = 2 CHD risk factors. Marland Kitchen LDL-C is now calculated using the Martin-Hopkins  calculation, which is a validated novel method providing  better accuracy than the Friedewald equation in the  estimation of LDL-C.  Horald Pollen et al. Lenox Ahr. 1610;960(45): 2061-2068  (http://education.[facility].com/faq/FAQ164)    LDL Chol Calc (NIH)  Date Value Ref Range Status  11/17/2020 90 0 - 99 mg/dL Final   HDL  Date Value Ref Range Status  11/17/2020 55 >39 mg/dL Final   Triglycerides  Date Value Ref Range Status  11/17/2020 147 0 - 149 mg/dL Final         Passed - Patient is not pregnant      Passed - Valid encounter within last 12 months    Recent Outpatient Visits           3 months ago Encounter for [employer] annual wellness exam   [facility] Mecum, Erin E, PA-C   5 months ago Hypothyroidism, unspecified type   [facility] Margarita Mail, DO   9 months ago Hyponatremia   [facility] Margarita Mail, DO   1 year ago Ambulatory dysfunction   [facility] Margarita Mail, DO   1 year ago Aspiration pneumonia of right lower lobe, unspecified aspiration pneumonia type [facility]   [facility] Gabriel Cirri, NP       Future Appointments             In 4 days Margarita Mail, DO [place] [facility]               Requested Prescriptions  Pending Prescriptions Disp Refills   atorvastatin (LIPITOR) 20 MG tablet [Pharmacy Med Name: Atorvastatin Calcium 20 MG Oral Tablet] 90 tablet 0    Sig: TAKE 1 TABLET BY MOUTH ONCE DAILY IN THE EVENING     Cardiovascular:  Antilipid - Statins Failed - 11/03/2022  9:40 AM      Failed - Lipid Panel in normal range within the last 12 months    Cholesterol, Total  Date Value Ref Range Status  11/17/2020 171 100 - 199 mg/dL Final   LDL Cholesterol (Calc)  Date Value Ref Range Status  07/02/2018 90 mg/dL (calc) Final    Comment:    Reference range: <100 . Desirable range <100 mg/dL for primary prevention;   <70 mg/dL for  patients with CHD or diabetic patients  with > or = 2 CHD risk factors. Marland Kitchen LDL-C is now calculated using the Martin-Hopkins  calculation, which is a validated novel method providing  better accuracy than the Friedewald equation in the  estimation of LDL-C.  Horald Pollen et al. Lenox Ahr. 4098;119(14): 2061-2068  (http://education.[facility].com/faq/FAQ164)    LDL Chol Calc (NIH)  Date Value Ref Range Status  11/17/2020 90 0 - 99 mg/dL Final   HDL  Date Value Ref Range Status  11/17/2020 55 >39 mg/dL Final   Triglycerides  Date Value Ref Range Status  11/17/2020 147 0 - 149 mg/dL Final         Passed - Patient is not pregnant      Passed - Valid encounter within last 12 months    Recent Outpatient Visits           3 months ago Encounter for [employer] annual wellness exam   [facility] Mecum, Oswaldo Conroy, PA-C   5 months ago Hypothyroidism, unspecified type   [facility] Margarita Mail, DO   9 months ago Hyponatremia   [facility] Margarita Mail, DO   1 year ago Ambulatory dysfunction   [facility] Margarita Mail, DO   1 year ago Aspiration pneumonia of right lower lobe, unspecified aspiration pneumonia type [facility])   [facility] Gabriel Cirri, NP       Future Appointments             In 4 days Margarita Mail, DO [facility]

## 2022-11-06 NOTE — Progress Notes (Unsigned)
Established Patient Office Visit  Subjective   Patient ID: Sydney Mcdaniel, female    DOB: 03/09/1925  Age: 89 y.o. MRN: [id number]  No chief complaint on file.   HPI Magean Uffelman is here for follow up on chronic medical conditions. She is here with her daughter.   Recurrent Falls: -Had another fall about 2 weeks ago - according to the patient's daughter the patient had had a BM, walked to the kitchen and was worried she had to have another BM so she walked back to the bathroom where she fell. She did not lose consciousness or hit her head but doesn't remember exactly what happened. She fell on her butt and didn't hit any extremities. -She ambulates with a walker at home and a cane outside the house -No steps in the house - one step to get from the backyard to the house -She is now working with PT  Hypertension/A.Fib/CHF: -Medications: Lisinopril 40 mg, Xarelto 15 mg, Spirolactone 25 mg. Midodrine 2.25 BID discontinued by Cardiology after her BP was high at PT. Amlodipine had been discontinued previously due to hypotension and bradycardia -Patient is compliant with above medications and reports no side effects at this point. -Checking BP at home (average): 140/90's  -Denies any SOB, CP, vision changes, LE edema or symptoms of hypotension -Following with Cardiology, note from 05/04/22 reviewed  -Last TTE in 1/23 with EF of 40-45%, euvolemic today -ICD implanted on 09/22/21, checked on 10/05/21. Doing very well since pacemaker was implanted.  Blood pressure has been stable.  She denies chest pain, palpitations, shortness of breath, dizziness, new focal weakness, imbalance issues.   HLD: -Medications: Lipitor 20 mg -Patient is compliant with above medications and reports no side effects.  -Last lipid panel: Lipid Panel     Component Value Date/Time   CHOL 171 11/17/2020 0855   TRIG 147 11/17/2020 0855   HDL 55 11/17/2020 0855   CHOLHDL 3.1 11/17/2020 0855   CHOLHDL 3.1 07/30/2019  0925   VLDL 22 07/30/2019 0925   LDLCALC 90 11/17/2020 0855   LDLCALC 90 07/02/2018 1040   LABVLDL 26 11/17/2020 0855   Hypothyroidism: -Medications: Levothyroxine 88 mcg on Tuesdays, Thursdays and Saturdays and 75 mcg on Mondays, Wednesdays, Fridays and Sundays -Patient is compliant with the above medication (s) at the above dose and reports no medication side effects.  -Denies weight changes, cold./heat intolerance, skin changes, anxiety/palpitations  -Last TSH: 9/22 2.05, thyroid panel normal   SIADH: -Following with Nephrology at [facility], note reviewed from 11/03/2021.  Patient's daughter states that since her last several sodiums had been normal - last sodium 11/6 138 -Had been on a fluid restriction, however patient's daughter states the only dietary changes the patient has had lately is eating more vegetables from the garden this summer.  Fluid intake has not changed.  Osteoporosis:  -Currently on Fosamax 70 mg weekly for 3 years at this point  -DEXA results from 2/20 showed t score 1.4, left femur -1.4 and in left forearm -2.8.  -Not currently on Vitamin D or calcium   Review of Systems  Constitutional:  Negative for chills and fever.  Eyes:  Negative for blurred vision.  Respiratory:  Negative for shortness of breath.   Cardiovascular:  Negative for chest pain and palpitations.  Musculoskeletal:  Negative for falls.  Neurological:  Negative for dizziness, tingling and headaches.      Objective:     LMP  (LMP Unknown)  BP Readings from Last 3  Encounters:  09/26/22 122/62  08/02/22 132/60  07/18/22 (!) 142/72   Wt Readings from Last 3 Encounters:  09/26/22 124 lb 3.2 oz (56.3 kg)  07/18/22 129 lb 6.4 oz (58.7 kg)  06/23/22 125 lb (56.7 kg)    Physical Exam Constitutional:      Appearance: Normal appearance.  HENT:     Head: Normocephalic and atraumatic.  Eyes:     Conjunctiva/sclera: Conjunctivae normal.  Cardiovascular:     Rate and Rhythm:  Normal rate and regular rhythm.  Pulmonary:     Effort: Pulmonary effort is normal.     Breath sounds: Normal breath sounds.  Musculoskeletal:     Right lower leg: No edema.     Left lower leg: No edema.  Skin:    General: Skin is warm and dry.     Findings: Bruising present.  Neurological:     General: No focal deficit present.     Mental Status: She is alert. Mental status is at baseline.  Psychiatric:        Mood and Affect: Mood normal.        Behavior: Behavior normal.      No results found for any visits on 11/07/22.  Last CBC Lab Results  Component Value Date   WBC 7.8 09/26/2022   HGB 12.6 09/26/2022   HCT 38.9 09/26/2022   MCV 94.2 09/26/2022   MCH 30.5 09/26/2022   RDW 13.8 09/26/2022   PLT 187 09/26/2022   Last metabolic panel Lab Results  Component Value Date   GLUCOSE 112 (H) 09/26/2022   NA 133 (L) 09/26/2022   K 4.2 09/26/2022   CL 101 09/26/2022   CO2 25 09/26/2022   BUN 17 09/26/2022   CREATININE 0.89 09/26/2022   EGFR 65 09/13/2021   CALCIUM 9.5 09/26/2022   PROT 7.1 01/31/2022   ALBUMIN 3.8 01/31/2022   LABGLOB 2.4 06/22/2015   AGRATIO 1.6 06/22/2015   BILITOT 1.0 01/31/2022   ALKPHOS 26 (L) 01/31/2022   AST 18 01/31/2022   ALT 12 01/31/2022   ANIONGAP 7 09/26/2022   Last lipids Lab Results  Component Value Date   CHOL 171 11/17/2020   HDL 55 11/17/2020   LDLCALC 90 11/17/2020   TRIG 147 11/17/2020   CHOLHDL 3.1 11/17/2020   Last hemoglobin A1c Lab Results  Component Value Date   HGBA1C 6.0 (H) 07/02/2018   Last thyroid functions Lab Results  Component Value Date   TSH 2.05 03/03/2021   T4TOTAL 8.9 03/03/2021   Last vitamin D Lab Results  Component Value Date   VD25OH 35 09/09/2018   Last vitamin B12 and Folate Lab Results  Component Value Date   VITAMINB12 452 11/16/2021   FOLATE 22.0 11/16/2021      The ASCVD Risk score (Arnett DK, et al., 2019) failed to calculate for the following reasons:   The 2019 ASCVD  risk score is only valid for ages 56 to 37    Assessment & Plan:   1. Hypothyroidism, unspecified type: Stable, continue current regimen, refilled today. Plan to recheck labs at follow up.  - levothyroxine (EUTHYROX) 75 MCG tablet; TAKE ONE TABLET BY MOUTH ON MONDAYS, WEDNESDAYS, FRIDAYS, AND SUNDAYS  Dispense: 52 tablet; Refill: 2 - levothyroxine (SYNTHROID) 88 MCG tablet; TAKE 1 TABLET BY MOUTH ON TUESDAYS, THURSDAYS AND SATURDAYS  Dispense: 39 tablet; Refill: 0  2. Essential hypertension: Much better controlled. Midodrine discontinued, currently on Lisinopril 40 mg and Spironolactone 25 mg. She will continue to monitor  blood pressure at home and let me know if it starts being elevated again at home.   3. Ambulatory dysfunction/Age-related osteoporosis without current pathological fracture: Working with physical therapy, is on Xarelto for A.Fib. Continue Fosamax for osteoporosis, refilled today.   - alendronate (FOSAMAX) 70 MG tablet; TAKE 1 TABLET BY MOUTH EVERY FRIDAY ON AN EMPTY STOMACH WITH A FULL GLASS OF WATER  Dispense: 12 tablet; Refill: 1   No follow-ups on file.    Margarita Mail, DO

## 2022-11-07 ENCOUNTER — Encounter: Payer: Self-pay | Admitting: Internal Medicine

## 2022-11-07 ENCOUNTER — Ambulatory Visit (INDEPENDENT_AMBULATORY_CARE_PROVIDER_SITE_OTHER): Payer: Medicare Other | Admitting: Internal Medicine

## 2022-11-07 VITALS — BP 144/70 | HR 88 | Temp 97.6°F | Resp 16 | Ht 62.0 in | Wt 126.0 lb

## 2022-11-07 DIAGNOSIS — I10 Essential (primary) hypertension: Secondary | ICD-10-CM | POA: Diagnosis not present

## 2022-11-07 DIAGNOSIS — I480 Paroxysmal atrial fibrillation: Secondary | ICD-10-CM

## 2022-11-07 DIAGNOSIS — E039 Hypothyroidism, unspecified: Secondary | ICD-10-CM

## 2022-11-07 DIAGNOSIS — E782 Mixed hyperlipidemia: Secondary | ICD-10-CM

## 2022-11-07 DIAGNOSIS — M810 Age-related osteoporosis without current pathological fracture: Secondary | ICD-10-CM

## 2022-11-07 DIAGNOSIS — Z23 Encounter for immunization: Secondary | ICD-10-CM | POA: Diagnosis not present

## 2022-11-07 DIAGNOSIS — E222 Syndrome of inappropriate secretion of antidiuretic hormone: Secondary | ICD-10-CM | POA: Diagnosis not present

## 2022-11-08 ENCOUNTER — Other Ambulatory Visit: Payer: Self-pay | Admitting: Internal Medicine

## 2022-11-08 DIAGNOSIS — E039 Hypothyroidism, unspecified: Secondary | ICD-10-CM

## 2022-11-08 LAB — COMPLETE METABOLIC PANEL WITH GFR
AG Ratio: 1.3 (calc) (ref 1.0–2.5)
ALT: 15 U/L (ref 6–29)
AST: 19 U/L (ref 10–35)
Albumin: 3.9 g/dL (ref 3.6–5.1)
Alkaline phosphatase (APISO): 31 U/L — ABNORMAL LOW (ref 37–153)
BUN: 14 mg/dL (ref 7–25)
CO2: 21 mmol/L (ref 20–32)
Calcium: 9.7 mg/dL (ref 8.6–10.4)
Chloride: 103 mmol/L (ref 98–110)
Creat: 0.91 mg/dL (ref 0.60–0.95)
Globulin: 3.1 g/dL (calc) (ref 1.9–3.7)
Glucose, Bld: 92 mg/dL (ref 65–99)
Potassium: 4.7 mmol/L (ref 3.5–5.3)
Sodium: 137 mmol/L (ref 135–146)
Total Bilirubin: 0.5 mg/dL (ref 0.2–1.2)
Total Protein: 7 g/dL (ref 6.1–8.1)
eGFR: 58 mL/min/{1.73_m2} — ABNORMAL LOW (ref >=60)

## 2022-11-08 LAB — TSH: TSH: 3.26 mIU/L (ref 0.40–4.50)

## 2022-11-08 MED ORDER — LEVOTHYROXINE SODIUM 75 MCG PO TABS
ORAL_TABLET | ORAL | 2 refills | Status: DC
Start: 2022-11-08 — End: 2023-04-06

## 2022-11-08 MED ORDER — LEVOTHYROXINE SODIUM 88 MCG PO TABS: ORAL_TABLET | ORAL | 0 refills | Status: DC

## 2022-11-09 ENCOUNTER — Ambulatory Visit: Payer: Self-pay | Admitting: *Deleted

## 2022-11-09 NOTE — Patient Outreach (Signed)
  Care Coordination   Follow Up Visit Note   11/09/2022 Name: Sydney Mcdaniel MRN: [id number] DOB: 1925-04-25  Sydney Mcdaniel is a 89 y.o. year old female who sees Margarita Mail, DO for primary care. I spoke with daughter of  Sydney Mcdaniel by phone today.  What matters to the patients health and wellness today?  Per daughter, patient is doing well overall, only concern currently is if she will need procedure for glaucoma    Goals Addressed             This Visit's Progress    RNCM: Effective Management of health and well being       Care Coordination Interventions: Evaluation of current treatment plan related to hyponatremia and hypertension and patient's adherence to plan as established by provider Advised patient to call the office for changes in conditions, new questions or concerns Reviewed medications with patient and discussed adherence and affordability.  Working with pharmacy team for medication assistance.  Per chart, Xarelto was being changed to Eliquis for this reason.  Collaborated with pharmacist on update, he will call daughter directly.  Discussed plans with patient for ongoing care management follow up and provided patient with direct contact information for care management team Advised patient to discuss changes in vision with provider History of Glaucoma, will see specialist on 6/10.  Will know at that time if she will have surgery.            SDOH assessments and interventions completed:  No     Care Coordination Interventions:  Yes, provided   Interventions Today    Flowsheet Row Most Recent Value  Chronic Disease   Chronic disease during today's visit Other  [Glaucoma]  General Interventions   General Interventions Discussed/Reviewed General Interventions Reviewed, Doctor Visits  Doctor Visits Discussed/Reviewed Doctor Visits Reviewed, PCP  [Cancer center on 5/22, Eye specialist on 6/10, will know if she is in need of surgery then.  Was seen  by dentist and PCP since last outreach, both visits had positive outcomes]  Education Interventions   Education Provided Provided Education  Provided Verbal Education On When to see the doctor        Follow up plan: Follow up call scheduled for 6/12    Encounter Outcome:  Pt. Visit Completed   Kemper Durie, RN, MSN, [facility] Care Management Care Management Coordinator [phone]

## 2022-11-15 ENCOUNTER — Encounter: Payer: Self-pay | Admitting: Oncology

## 2022-11-15 ENCOUNTER — Inpatient Hospital Stay: Payer: Medicare Other | Attending: Oncology

## 2022-11-15 ENCOUNTER — Inpatient Hospital Stay (HOSPITAL_BASED_OUTPATIENT_CLINIC_OR_DEPARTMENT_OTHER): Payer: Medicare Other | Admitting: Oncology

## 2022-11-15 VITALS — BP 142/66 | HR 69 | Temp 97.0°F | Resp 18 | Ht 62.0 in | Wt 123.4 lb

## 2022-11-15 DIAGNOSIS — C9110 Chronic lymphocytic leukemia of B-cell type not having achieved remission: Secondary | ICD-10-CM

## 2022-11-15 DIAGNOSIS — R918 Other nonspecific abnormal finding of lung field: Secondary | ICD-10-CM | POA: Insufficient documentation

## 2022-11-15 DIAGNOSIS — E871 Hypo-osmolality and hyponatremia: Secondary | ICD-10-CM | POA: Diagnosis not present

## 2022-11-15 DIAGNOSIS — E538 Deficiency of other specified B group vitamins: Secondary | ICD-10-CM | POA: Diagnosis not present

## 2022-11-15 LAB — CBC WITH DIFFERENTIAL/PLATELET
Abs Immature Granulocytes: 0.03 10*3/uL (ref 0.00–0.07)
Basophils Absolute: 0 10*3/uL (ref 0.0–0.1)
Basophils Relative: 0 %
Eosinophils Absolute: 0.1 10*3/uL (ref 0.0–0.5)
Eosinophils Relative: 1 %
HCT: 39.1 % (ref 36.0–46.0)
Hemoglobin: 12.8 g/dL (ref 12.0–15.0)
Immature Granulocytes: 0 %
Lymphocytes Relative: 47 %
Lymphs Abs: 3.9 10*3/uL (ref 0.7–4.0)
MCH: 30.7 pg (ref 26.0–34.0)
MCHC: 32.7 g/dL (ref 30.0–36.0)
MCV: 93.8 fL (ref 80.0–100.0)
Monocytes Absolute: 0.6 10*3/uL (ref 0.1–1.0)
Monocytes Relative: 7 %
Neutro Abs: 3.8 10*3/uL (ref 1.7–7.7)
Neutrophils Relative %: 45 %
Platelets: 204 10*3/uL (ref 150–400)
RBC: 4.17 MIL/uL (ref 3.87–5.11)
RDW: 14 % (ref 11.5–15.5)
Smear Review: NORMAL
WBC: 8.4 10*3/uL (ref 4.0–10.5)
nRBC: 0 % (ref 0.0–0.2)

## 2022-11-15 NOTE — Progress Notes (Signed)
Hematology/Oncology Consult note [facility]  [phone] [phone]  Patient Care Team: Margarita Mail, DO as PCP - General (Internal Medicine) End, Cristal Deer, MD as PCP - Cardiology (Cardiology) Isla Pence, OD (Optometry) End, Cristal Deer, MD as Consulting Physician (Cardiology) Mady Haagensen, MD (Nephrology) Creig Hines, MD as Consulting Physician (Oncology) Marlowe Sax, RN as Registered Nurse (General Practice) Marlowe Sax, RN as Case Manager (General Practice) Kemper Durie, RN as [facility] Care Management Gaspar Cola, [facility] as Pharmacist (Pharmacist)   Name of the patient: Sydney Mcdaniel  [id number]  [birth year]   Date of visit: 11/15/22  Diagnosis- Rai stage 0 CLL   Chief complaint/ Reason for visit-routine follow-up of CLL  Heme/Onc history: Patient is a 89 year old female who was diagnosed with Rai stage 0 CLL in in 2016. Peripheral blood flow cytometry revealed involvement by a CD5+, CD23+, CD38- monoclonal B cell population with lambda light chain restriction consistent with CLL/SLL.  There was a small population (8% of the T cells) of double positive (CD4+/CD8+) T cells (described in association with chronic viral infections, autoimmune disorders, chronic inflammatory disorders, and immunodeficiency states).  She has not required any treatment for leukemia so far   Patient has had chronic normocytic anemia with a hemoglobin that fluctuates between 10-11 at least dating back to 2017.  She has a history of B12 deficiency and is on monthly B12 injections.  Iron studies have been normal in the past.  Reticulocyte count was normal indicative of hypoproliferative anemia.  Coombs test has been positive in the past but patient has had a normal to low reticulocyte count, normal LDH and no elevated bilirubin   Patient also has chronic hyponatremia and stable pulmonary nodules which do not require  further follow-up  Interval history-patient is doing well for her age and denies any specific complaints at this time.  Appetite and weight have remained stable.  ECOG PS- 2 Pain scale- 0   Review of systems- Review of Systems  Constitutional:  Negative for chills, fever, malaise/fatigue and weight loss.  HENT:  Negative for congestion, ear discharge and nosebleeds.   Eyes:  Negative for blurred vision.  Respiratory:  Negative for cough, hemoptysis, sputum production, shortness of breath and wheezing.   Cardiovascular:  Negative for chest pain, palpitations, orthopnea and claudication.  Gastrointestinal:  Negative for abdominal pain, blood in stool, constipation, diarrhea, heartburn, melena, nausea and vomiting.  Genitourinary:  Negative for dysuria, flank pain, frequency, hematuria and urgency.  Musculoskeletal:  Negative for back pain, joint pain and myalgias.  Skin:  Negative for rash.  Neurological:  Negative for dizziness, tingling, focal weakness, seizures, weakness and headaches.  Endo/Heme/Allergies:  Does not bruise/bleed easily.  Psychiatric/Behavioral:  Negative for depression and suicidal ideas. The patient does not have insomnia.       Allergies  Allergen Reactions   Alphagan [Brimonidine] Itching and Rash   Pravachol [Pravastatin Sodium] Itching and Rash     Past Medical History:  Diagnosis Date   Allergy    Anemia    Cardiomyopathy (HCC)    a. 02/2019 Echo: EF 60-65%, no rwma, doppler parameters consistent w/ pseudonormalization. Nl RV size/fxn; b. 06/2021 Echo: EF 40-45% w/ sev basal-mid antsept HK. GrII DD. Nl RV size/fxn. Mild MR/AI.   Carotid arterial disease (HCC)    a. 04/2020 Carotid U/S: 1-39% bilat ICA stenosis, <50% bilat ECA stenosis; b. 06/2021 Carotid U/S: RICA 1-39%, LICA 40-59%.   Cataract  CLL (chronic lymphocytic leukemia) (HCC)    GERD (gastroesophageal reflux disease)    Glaucoma    H/O: hysterectomy    Total   History of stress test     a. 02/2019 MV: EF 60%, no ischemia/infarct.   Hyperlipidemia    Hypertension    Hypothyroidism    Impaired fasting glucose    Lichen sclerosus    Osteoporosis    Hips   PAF (paroxysmal atrial fibrillation) (HCC)    a. 06/15/2016 Event monitor: 4% afib burden; b. CHA2DS2VASc - 4-->Xarelto.   Sinus bradycardia    a. avoid AVN blocking agents.   Syncope    a. 03/2016 Echo: EF 55-60%, no rwma, mild AI/MR, nl PASP; b. 03/2016 48h Holter: no significant arrhythmias/pauses; c. 03/2016 MV: mild apical defect, likely breast attenuation, nl EF, low risk; d. 05/2016 Event monitor: No significant arrhythmia; e. 05/2016 Event monitor: PAF (4%).     Past Surgical History:  Procedure Laterality Date   APPENDECTOMY     EYE SURGERY     Glaucoma   MULTIPLE TOOTH EXTRACTIONS N/A    2 teeth   PACEMAKER IMPLANT N/A 09/22/2021   Procedure: PACEMAKER IMPLANT;  Surgeon: Duke Salvia, MD;  Location: [facility] INVASIVE CV LAB;  Service: Cardiovascular;  Laterality: N/A;   TOTAL ABDOMINAL HYSTERECTOMY      Social History   Socioeconomic History   Marital status: Widowed    Spouse name: Not on file   Number of children: 2   Years of education: Not on file   Highest education level: High school graduate  Occupational History   Occupation: retired  Tobacco Use   Smoking status: Never   Smokeless tobacco: Never  Vaping Use   Vaping Use: Never used  Substance and Sexual Activity   Alcohol use: No   Drug use: No   Sexual activity: Not Currently  Other Topics Concern   Not on file  Social History Narrative   Pt lives alone. Daughter Clydie Braun close by   Social Determinants of Health   Financial Resource Strain: High Risk (08/02/2022)   Overall Financial Resource Strain (CARDIA)    Difficulty of Paying Living Expenses: Hard  Food Insecurity: No Food Insecurity (03/22/2022)   Hunger Vital Sign    Worried About Running Out of Food in the Last Year: Never true    Ran Out of Food in the Last Year: Never  true  Transportation Needs: No Transportation Needs (08/02/2022)   PRAPARE - Administrator, Civil Service (Medical): No    Lack of Transportation (Non-Medical): No  Physical Activity: Inactive (07/21/2021)   Exercise Vital Sign    Days of Exercise per Week: 0 days    Minutes of Exercise per Session: 0 min  Stress: No Stress Concern Present (03/22/2022)   [employer] of Occupational Health - Occupational Stress Questionnaire    Feeling of Stress : Not at all  Social Connections: Moderately Isolated (03/22/2022)   Social Connection and Isolation Panel [NHANES]    Frequency of Communication with Friends and Family: More than three times a week    Frequency of Social Gatherings with Friends and Family: More than three times a week    Attends Religious Services: More than 4 times per year    Active Member of [employer] or Organizations: No    Attends Banker Meetings: Never    Marital Status: Widowed  Intimate Partner Violence: Not At Risk (03/22/2022)   Humiliation, Afraid, Rape, and Kick questionnaire  Fear of Current or Ex-Partner: No    Emotionally Abused: No    Physically Abused: No    Sexually Abused: No    Family History  Problem Relation Age of Onset   Heart attack Mother    Glaucoma Mother    Heart attack Father    Parkinson's disease Brother    Heart attack Brother      Current Outpatient Medications:    acetaminophen (TYLENOL) 500 MG tablet, Take 500 mg by mouth every 8 (eight) hours as needed for moderate pain., Disp: , Rfl:    alendronate (FOSAMAX) 70 MG tablet, TAKE 1 TABLET BY MOUTH EVERY FRIDAY ON AN EMPTY STOMACH WITH A FULL GLASS OF WATER, Disp: 12 tablet, Rfl: 1   amLODipine (NORVASC) 2.5 MG tablet, Take 1 tablet (2.5 mg total) by mouth daily., Disp: 90 tablet, Rfl: 3   atorvastatin (LIPITOR) 20 MG tablet, TAKE 1 TABLET BY MOUTH ONCE DAILY IN THE EVENING, Disp: 90 tablet, Rfl: 0   Calcium Carbonate-Vit D-Min (CALCIUM 1200 PO), Take by  mouth. daily, Disp: , Rfl:    Cholecalciferol 25 MCG (1000 UT) tablet, Take by mouth., Disp: , Rfl:    ferrous sulfate 325 (65 FE) MG tablet, Take 2 tablets (650 mg total) by mouth daily with breakfast., Disp: , Rfl:    LATANOPROST OP, Place 1 drop into the right eye at bedtime., Disp: , Rfl:    levothyroxine (EUTHYROX) 75 MCG tablet, TAKE ONE TABLET BY MOUTH ON MONDAYS, WEDNESDAYS, FRIDAYS, AND SUNDAYS, Disp: 52 tablet, Rfl: 2   levothyroxine (SYNTHROID) 88 MCG tablet, TAKE 1 TABLET BY MOUTH ON TUESDAYS, THURSDAYS AND SATURDAYS, Disp: 90 tablet, Rfl: 0   lisinopril (ZESTRIL) 10 MG tablet, Take 1 tablet by mouth daily as needed for systolic BP > than 160., Disp: 90 tablet, Rfl: 3   ondansetron (ZOFRAN) 8 MG tablet, Take 8 mg by mouth as needed., Disp: , Rfl:    polyethylene glycol (MIRALAX / GLYCOLAX) 17 g packet, Take 17 g by mouth daily., Disp: , Rfl:    rivaroxaban (XARELTO) 20 MG TABS tablet, Take 1 tablet (20 mg total) by mouth daily with supper., Disp: 90 tablet, Rfl: 0   sodium chloride (MURO 128) 5 % ophthalmic solution, Place 1 drop into the right eye in the morning, at noon, and at bedtime., Disp: , Rfl:    sodium chloride 1 g tablet, Take 1 g by mouth. Takes 1/2 tablet with breakfast, 1/2 tablet with lunch, full tablet at supper, Disp: , Rfl:    spironolactone (ALDACTONE) 25 MG tablet, Take 1 tablet by mouth once daily, Disp: 90 tablet, Rfl: 0   vitamin B-12 (CYANOCOBALAMIN) 100 MCG tablet, Take 100 mcg by mouth daily., Disp: , Rfl:   Physical exam:  Vitals:   11/15/22 1100 11/15/22 1107  BP: (!) 153/60 (!) 142/66  Pulse: 65 69  Resp: 18   Temp: (!) 97 F (36.1 C)   TempSrc: Tympanic   SpO2: 100%   Weight: 123 lb 6.4 oz (56 kg)   Height: 5\' 2"  (1.575 m)    Physical Exam Cardiovascular:     Rate and Rhythm: Normal rate and regular rhythm.     Heart sounds: Normal heart sounds.  Pulmonary:     Effort: Pulmonary effort is normal.     Breath sounds: Normal breath sounds.   Abdominal:     General: Bowel sounds are normal.     Palpations: Abdomen is soft.  Lymphadenopathy:     Comments:  No palpable cervical, supraclavicular, axillary or inguinal adenopathy    Skin:    General: Skin is warm and dry.  Neurological:     Mental Status: She is alert and oriented to person, place, and time.         Latest Ref Rng & Units 11/07/2022    9:51 AM  CMP  Glucose 65 - 99 mg/dL 92   BUN 7 - 25 mg/dL 14   Creatinine 1.61 - 0.95 mg/dL 0.96   Sodium 045 - 409 mmol/L 137   Potassium 3.5 - 5.3 mmol/L 4.7   Chloride 98 - 110 mmol/L 103   CO2 20 - 32 mmol/L 21   Calcium 8.6 - 10.4 mg/dL 9.7   Total Protein 6.1 - 8.1 g/dL 7.0   Total Bilirubin 0.2 - 1.2 mg/dL 0.5   AST 10 - 35 U/L 19   ALT 6 - 29 U/L 15       Latest Ref Rng & Units 11/15/2022   10:51 AM  CBC  WBC 4.0 - 10.5 K/uL 8.4   Hemoglobin 12.0 - 15.0 g/dL 81.1   Hematocrit 91.4 - 46.0 % 39.1   Platelets 150 - 400 K/uL 204      Assessment and plan- Patient is a 89 y.o. female with Rai stage 0 CLL here for routine follow-up  Patient has a normal white cell count with no significant lymphocytosis.  Hemoglobin and platelets are normal.  No B symptoms or palpable adenopathy or splenomegaly.  Her CLL is likely to persist for life but in the absence of B symptoms cytopenias or worsening lymphocytosis does not require any treatment.  Given her age patient can continue to follow-up with her primary care doctor at this time.  She can be referred to [place] in the future if questions or concerns arise   Visit Diagnosis 1. CLL (chronic lymphocytic leukemia) (HCC)      Dr. Owens Shark, MD, MPH [facility] at [facility] [phone] 11/15/2022 3:26 PM

## 2022-11-17 ENCOUNTER — Other Ambulatory Visit: Payer: Medicare Other

## 2022-11-17 ENCOUNTER — Ambulatory Visit: Payer: Medicare Other | Admitting: Oncology

## 2022-12-04 DIAGNOSIS — H18221 Idiopathic corneal edema, right eye: Secondary | ICD-10-CM | POA: Diagnosis not present

## 2022-12-04 DIAGNOSIS — H401133 Primary open-angle glaucoma, bilateral, severe stage: Secondary | ICD-10-CM | POA: Diagnosis not present

## 2022-12-04 DIAGNOSIS — Z961 Presence of intraocular lens: Secondary | ICD-10-CM | POA: Diagnosis not present

## 2022-12-04 DIAGNOSIS — Z9889 Other specified postprocedural states: Secondary | ICD-10-CM | POA: Diagnosis not present

## 2022-12-05 ENCOUNTER — Other Ambulatory Visit: Payer: Self-pay | Admitting: Internal Medicine

## 2022-12-05 NOTE — Telephone Encounter (Signed)
Please schedule 6 month F/U appointment. Thank you! 

## 2022-12-06 ENCOUNTER — Encounter: Payer: Self-pay | Admitting: *Deleted

## 2022-12-06 ENCOUNTER — Telehealth: Payer: Self-pay

## 2022-12-06 ENCOUNTER — Encounter: Payer: Self-pay | Admitting: Internal Medicine

## 2022-12-06 NOTE — Patient Outreach (Signed)
  Care Coordination   Follow Up Visit Note   12/06/2022 Name: TRENISHA LAFAVOR MRN: [id number] DOB: 1925-03-30  CAMILE ESTERS is a 89 y.o. year old female who sees Margarita Mail, DO for primary care. I spoke with daughter/ dpr Romero Liner  by phone today.  What matters to the patients health and wellness today?  Daughter states over the last 2-3 week patient has complained of feeling weak in the mornings.  She denies patient verbalizing or exhibiting any other symptoms.  Daughter states patients blood pressure has ranged from 125-155/70's and oxygen levels have been 98-99.  She states she is unsure of patients pulse rates.  Daughter states patient had 2 teeth pulled last week.  She states patient has lost some weight over the last 3-4 months.     Goals Addressed             This Visit's Progress    managment of new onset symptoms       Interventions Today    Flowsheet Row Most Recent Value  Chronic Disease   Chronic disease during today's visit Other  [weakness]  General Interventions   General Interventions Discussed/Reviewed General Interventions Reviewed  [Assessed for blood pressure/ pulse / oxgen level readings.   Advised daughter to call  or send Mychart message to primary care provider regarding new onset symptoms of weakness.]  Pharmacy Interventions   Pharmacy Dicussed/Reviewed Pharmacy Topics Reviewed  Pearletha Furl for medication adjustments/ changes.]              SDOH assessments and interventions completed:  No     Care Coordination Interventions:  Yes, provided   Follow up plan: Follow up call scheduled for as previously scheduled    Encounter Outcome:  Pt. Visit Completed   George Ina RN,BSN,CCM [facility] Care Coordination [phone] direct line

## 2022-12-11 ENCOUNTER — Encounter: Payer: Self-pay | Admitting: Physician Assistant

## 2022-12-11 ENCOUNTER — Ambulatory Visit (INDEPENDENT_AMBULATORY_CARE_PROVIDER_SITE_OTHER): Payer: Medicare Other | Admitting: Physician Assistant

## 2022-12-11 ENCOUNTER — Other Ambulatory Visit: Payer: Self-pay

## 2022-12-11 VITALS — BP 124/76 | HR 98 | Temp 97.8°F | Resp 16 | Ht 62.0 in | Wt 123.6 lb

## 2022-12-11 DIAGNOSIS — R319 Hematuria, unspecified: Secondary | ICD-10-CM

## 2022-12-11 DIAGNOSIS — N390 Urinary tract infection, site not specified: Secondary | ICD-10-CM

## 2022-12-11 DIAGNOSIS — N183 Chronic kidney disease, stage 3 unspecified: Secondary | ICD-10-CM

## 2022-12-11 DIAGNOSIS — R300 Dysuria: Secondary | ICD-10-CM

## 2022-12-11 DIAGNOSIS — R5383 Other fatigue: Secondary | ICD-10-CM

## 2022-12-11 DIAGNOSIS — D649 Anemia, unspecified: Secondary | ICD-10-CM | POA: Diagnosis not present

## 2022-12-11 LAB — POCT URINALYSIS DIPSTICK
Bilirubin, UA: NEGATIVE
Glucose, UA: NEGATIVE
Ketones, UA: NEGATIVE
Nitrite, UA: NEGATIVE
Protein, UA: NEGATIVE
Spec Grav, UA: 1.01 (ref 1.010–1.025)
Urobilinogen, UA: 0.2 E.U./dL
pH, UA: 5 (ref 5.0–8.0)

## 2022-12-11 NOTE — Progress Notes (Unsigned)
Acute Office Visit   Patient: Sydney Mcdaniel   DOB: 12-15-1924   89 y.o. Female  MRN: [id number] Visit Date: 12/11/2022  Today's healthcare provider: Oswaldo Conroy Aliany Fiorenza, PA-C  Introduced myself to the patient as a Secondary school teacher and provided education on APPs in clinical practice.    Chief Complaint  Patient presents with   Fatigue    For 3 weeks   Subjective    HPI HPI     Fatigue    Additional comments: For 3 weeks      Last edited by Riesa Pope, RMA on 12/11/2022 10:41 AM.      She is here with her daughter Her daughter states that for the past 3 weeks, the patient has told her she feels "sick" Her daughter states this usually means she is feeling weak  Her daughter reports the patient has a hx of vasovagal symptoms while using the restroom She had teeth pulled 2 weeks ago and weakness was occurring prior   Her daughter reports she has noticed some SOBOE while the patient is walking  They deny urinary symptoms at this time    Medications: Outpatient Medications Prior to Visit  Medication Sig   acetaminophen (TYLENOL) 500 MG tablet Take 500 mg by mouth every 8 (eight) hours as needed for moderate pain.   alendronate (FOSAMAX) 70 MG tablet TAKE 1 TABLET BY MOUTH EVERY FRIDAY ON AN EMPTY STOMACH WITH A FULL GLASS OF WATER   amLODipine (NORVASC) 2.5 MG tablet Take 1 tablet (2.5 mg total) by mouth daily.   atorvastatin (LIPITOR) 20 MG tablet TAKE 1 TABLET BY MOUTH ONCE DAILY IN THE EVENING   Calcium Carbonate-Vit D-Min (CALCIUM 1200 PO) Take by mouth. daily   Cholecalciferol 25 MCG (1000 UT) tablet Take by mouth.   ferrous sulfate 325 (65 FE) MG tablet Take 2 tablets (650 mg total) by mouth daily with breakfast.   LATANOPROST OP Place 1 drop into the right eye at bedtime.   levothyroxine (EUTHYROX) 75 MCG tablet TAKE ONE TABLET BY MOUTH ON MONDAYS, WEDNESDAYS, FRIDAYS, AND SUNDAYS   levothyroxine (SYNTHROID) 88 MCG tablet TAKE 1 TABLET BY MOUTH ON TUESDAYS, THURSDAYS  AND SATURDAYS   lisinopril (ZESTRIL) 10 MG tablet Take 1 tablet by mouth daily as needed for systolic BP > than 160.   ondansetron (ZOFRAN) 8 MG tablet Take 8 mg by mouth as needed.   polyethylene glycol (MIRALAX / GLYCOLAX) 17 g packet Take 17 g by mouth daily.   rivaroxaban (XARELTO) 20 MG TABS tablet Take 1 tablet (20 mg total) by mouth daily with supper.   sodium chloride (MURO 128) 5 % ophthalmic solution Place 1 drop into the right eye in the morning, at noon, and at bedtime.   sodium chloride 1 g tablet Take 1 g by mouth. Takes 1/2 tablet with breakfast, 1/2 tablet with lunch, full tablet at supper   spironolactone (ALDACTONE) 25 MG tablet Take 1 tablet by mouth once daily   vitamin B-12 (CYANOCOBALAMIN) 100 MCG tablet Take 100 mcg by mouth daily.   No facility-administered medications prior to visit.    Review of Systems  Constitutional:  Positive for fatigue.  Respiratory:  Positive for shortness of breath.   Genitourinary:  Negative for dysuria, frequency and urgency.    {Labs  Heme  Chem  Endocrine  Serology  Results Review (optional):23779}   Objective    BP 124/76   Pulse 98   Temp 97.8  F (36.6 C) (Oral)   Resp 16   Ht 5\' 2"  (1.575 m)   Wt 123 lb 9.6 oz (56.1 kg)   LMP  (LMP Unknown)   SpO2 99%   BMI 22.61 kg/m  {Show previous vital signs (optional):23777}  Physical Exam Vitals reviewed.  Constitutional:      General: She is awake.     Appearance: Normal appearance. She is well-developed and well-groomed.  HENT:     Head: Normocephalic and atraumatic.  Eyes:     General: Lids are normal. Gaze aligned appropriately.     Extraocular Movements: Extraocular movements intact.     Conjunctiva/sclera: Conjunctivae normal.  Cardiovascular:     Rate and Rhythm: Normal rate and regular rhythm.     Pulses: Normal pulses.          Radial pulses are 2+ on the right side and 2+ on the left side.     Heart sounds: Normal heart sounds. No murmur heard.    No  friction rub. No gallop.  Pulmonary:     Effort: Pulmonary effort is normal.     Breath sounds: Normal breath sounds. No decreased air movement. No decreased breath sounds, wheezing, rhonchi or rales.  Musculoskeletal:     Cervical back: Normal range of motion and neck supple.     Right lower leg: No edema.     Left lower leg: No edema.  Lymphadenopathy:     Head:     Right side of head: No submental, submandibular or preauricular adenopathy.     Left side of head: No submental, submandibular or preauricular adenopathy.     Cervical:     Right cervical: No superficial or posterior cervical adenopathy.    Left cervical: No superficial or posterior cervical adenopathy.     Upper Body:     Right upper body: No supraclavicular adenopathy.     Left upper body: No supraclavicular adenopathy.  Neurological:     General: No focal deficit present.     Mental Status: She is alert and oriented to person, place, and time. Mental status is at baseline.     GCS: GCS eye subscore is 4. GCS verbal subscore is 5. GCS motor subscore is 6.     Cranial Nerves: No dysarthria or facial asymmetry.     Motor: No weakness or tremor.     Comments: Strength 5/5 and symmetrical at the following: grip, hip flexion, knee extension, plantar and dorsiflexion   Psychiatric:        Behavior: Behavior is cooperative.       No results found for any visits on 12/11/22.  Assessment & Plan      No follow-ups on file.      Problem List Items Addressed This Visit   None Visit Diagnoses     Fatigue, unspecified type    -  Primary   Relevant Orders   CBC w/Diff/Platelet   COMPLETE METABOLIC PANEL WITH GFR   Thyroid Panel With TSH   Iron, TIBC and Ferritin Panel   B12   Vitamin D (25 hydroxy)   POCT urinalysis dipstick   Stage 3 chronic kidney disease, unspecified whether stage 3a or 3b CKD (HCC)       Relevant Orders   Vitamin D (25 hydroxy)        No follow-ups on file.   I, Kourtney Montesinos E Milliani Herrada, PA-C,  have reviewed all documentation for this visit. The documentation on 12/11/22 for the exam, diagnosis, procedures, and  orders are all accurate and complete.   Talitha Givens, MHS, PA-C [facility]

## 2022-12-12 ENCOUNTER — Telehealth: Payer: Self-pay | Admitting: *Deleted

## 2022-12-12 LAB — IRON,TIBC AND FERRITIN PANEL
%SAT: 22 % (calc) (ref 16–45)
Ferritin: 95 ng/mL (ref 16–288)
Iron: 57 ug/dL (ref 45–160)
TIBC: 257 mcg/dL (calc) (ref 250–450)

## 2022-12-12 LAB — CBC WITH DIFFERENTIAL/PLATELET
Absolute Monocytes: 653 cells/uL (ref 200–950)
Basophils Absolute: 18 cells/uL (ref 0–200)
Basophils Relative: 0.2 %
Eosinophils Absolute: 92 cells/uL (ref 15–500)
Eosinophils Relative: 1 %
HCT: 39.5 % (ref 35.0–45.0)
Hemoglobin: 12.9 g/dL (ref 11.7–15.5)
Lymphs Abs: 4287 cells/uL — ABNORMAL HIGH (ref 850–3900)
MCH: 30.5 pg (ref 27.0–33.0)
MCHC: 32.7 g/dL (ref 32.0–36.0)
MCV: 93.4 fL (ref 80.0–100.0)
MPV: 8.9 fL (ref 7.5–12.5)
Monocytes Relative: 7.1 %
Neutro Abs: 4149 cells/uL (ref 1500–7800)
Neutrophils Relative %: 45.1 %
Platelets: 196 10*3/uL (ref 140–400)
RBC: 4.23 10*6/uL (ref 3.80–5.10)
RDW: 13.5 % (ref 11.0–15.0)
Total Lymphocyte: 46.6 %
WBC: 9.2 10*3/uL (ref 3.8–10.8)

## 2022-12-12 LAB — COMPLETE METABOLIC PANEL WITH GFR
AG Ratio: 1.5 (calc) (ref 1.0–2.5)
ALT: 15 U/L (ref 6–29)
AST: 17 U/L (ref 10–35)
Albumin: 4.1 g/dL (ref 3.6–5.1)
Alkaline phosphatase (APISO): 31 U/L — ABNORMAL LOW (ref 37–153)
BUN: 10 mg/dL (ref 7–25)
CO2: 30 mmol/L (ref 20–32)
Calcium: 9.5 mg/dL (ref 8.6–10.4)
Chloride: 100 mmol/L (ref 98–110)
Creat: 0.88 mg/dL (ref 0.60–0.95)
Globulin: 2.7 g/dL (calc) (ref 1.9–3.7)
Glucose, Bld: 97 mg/dL (ref 65–99)
Potassium: 4 mmol/L (ref 3.5–5.3)
Sodium: 136 mmol/L (ref 135–146)
Total Bilirubin: 0.5 mg/dL (ref 0.2–1.2)
Total Protein: 6.8 g/dL (ref 6.1–8.1)
eGFR: 60 mL/min/{1.73_m2} (ref >=60)

## 2022-12-12 LAB — URINE CULTURE
MICRO NUMBER:: 15092264
SPECIMEN QUALITY:: ADEQUATE

## 2022-12-12 LAB — VITAMIN B12: Vitamin B-12: 480 pg/mL (ref 200–1100)

## 2022-12-12 LAB — THYROID PANEL WITH TSH
Free Thyroxine Index: 3.2 (ref 1.4–3.8)
T3 Uptake: 35 % (ref 22–35)
T4, Total: 9.2 ug/dL (ref 5.1–11.9)
TSH: 1.18 mIU/L (ref 0.40–4.50)

## 2022-12-12 LAB — VITAMIN D 25 HYDROXY (VIT D DEFICIENCY, FRACTURES): Vit D, 25-Hydroxy: 57 ng/mL (ref 30–100)

## 2022-12-12 MED ORDER — SULFAMETHOXAZOLE-TRIMETHOPRIM 800-160 MG PO TABS
1.0000 | ORAL_TABLET | Freq: Two times a day (BID) | ORAL | 0 refills | Status: AC
Start: 2022-12-12 — End: 2022-12-19

## 2022-12-12 NOTE — Patient Outreach (Signed)
  Care Coordination   Follow Up Visit Note   12/13/2022 Name: Sydney Mcdaniel MRN: [id number] DOB: [birth year]  Sydney Mcdaniel is a 89 y.o. year old female who sees Sydney Mail, DO for primary care. I spoke with daughter of Sydney Mcdaniel by phone today.  What matters to the patients health and wellness today?  Determine cause of weakness and treat.    Goals Addressed             This Visit's Progress    managment of new onset symptoms        Interventions Today    Flowsheet Row Most Recent Value  Chronic Disease   Chronic disease during today's visit Congestive Heart Failure (CHF), Atrial Fibrillation (AFib)  General Interventions   General Interventions Discussed/Reviewed General Interventions Reviewed, Doctor Visits  Doctor Visits Discussed/Reviewed Specialist, Doctor Visits Reviewed  [cardiology 7/8]  PCP/Specialist Visits Compliance with follow-up visit  [Seen by PCP this week for weakness, labs done, urine sent to assess for UTI, waiting for results]  Education Interventions   Education Provided Provided Education  Provided Verbal Education On When to see the doctor, Other  [Encouraged to call PCP if no call to review test results.  Also advised to call cardiology office to see if sooner appointment can be made as daughter feels weakness may be caused by HF or A-fib]             SDOH assessments and interventions completed:  No     Care Coordination Interventions:  Yes, provided   Follow up plan: Follow up call scheduled for 7/25    Encounter Outcome:  Pt. Visit Completed   Sydney Durie, RN, MSN, [facility] Care Management Care Management Coordinator [phone]

## 2022-12-12 NOTE — Progress Notes (Signed)
Your lab results are back Your urine was positive for blood and white blood cells which can be an indication of a UTI.  I have sent off a sample for urine culture but in the meantime I would like you to start a medication I have sent in to treat a UTI.  This medication is called Bactrim and you should take it twice per day with food and plenty of water for 7 days unless you are directed to stop or if you develop an allergic reaction. Your electrolytes, liver and kidney function are overall in normal limits. Your thyroid function looks normal. Your iron levels are normal. Your B12 and vitamin D are in normal range. Your blood count does not show signs of anemia but there appears to be a mild increase in your white blood cells from your normal range compared to previous results.  This could be caused by the suspected UTI. We will keep you updated once your urine culture results are back and if any changes need to be made to the current regimen.  Please let us know if you have further questions or concerns

## 2022-12-14 NOTE — Progress Notes (Signed)
Your urine culture did not show evidence of bacterial infection requiring antibiotics. You can stop taking the Bactrim at this time. We can try completing a cervicovaginal swab to check for yeast, bacterial vaginosis and trichomonas. Sometimes an infection in the vulvovaginal area can cause some of the results from the urinalysis. Please schedule a nurse apt at the office and come in to complete this at your earliest convenience.

## 2022-12-15 ENCOUNTER — Ambulatory Visit: Payer: Medicare Other

## 2022-12-15 DIAGNOSIS — H18222 Idiopathic corneal edema, left eye: Secondary | ICD-10-CM | POA: Diagnosis not present

## 2022-12-15 DIAGNOSIS — H401132 Primary open-angle glaucoma, bilateral, moderate stage: Secondary | ICD-10-CM | POA: Diagnosis not present

## 2022-12-15 DIAGNOSIS — H04123 Dry eye syndrome of bilateral lacrimal glands: Secondary | ICD-10-CM | POA: Diagnosis not present

## 2022-12-22 ENCOUNTER — Ambulatory Visit (INDEPENDENT_AMBULATORY_CARE_PROVIDER_SITE_OTHER): Payer: Medicare Other

## 2022-12-22 DIAGNOSIS — I429 Cardiomyopathy, unspecified: Secondary | ICD-10-CM

## 2022-12-24 LAB — CUP PACEART REMOTE DEVICE CHECK
Battery Remaining Percentage: 90 %
Brady Statistic RA Percent Paced: 1 %
Brady Statistic RV Percent Paced: 48 %
Date Time Interrogation Session: 20240628131424
Implantable Lead Connection Status: 753985
Implantable Lead Connection Status: 753985
Implantable Lead Implant Date: 20230330
Implantable Lead Implant Date: 20230330
Implantable Lead Location: 753859
Implantable Lead Location: 753860
Implantable Lead Model: 377
Implantable Lead Model: 399
Implantable Lead Serial Number: 8000191707
Implantable Lead Serial Number: 8000584009
Implantable Pulse Generator Implant Date: 20230330
Lead Channel Impedance Value: 527 Ohm
Lead Channel Impedance Value: 702 Ohm
Lead Channel Sensing Intrinsic Amplitude: 0.5 mV
Lead Channel Sensing Intrinsic Amplitude: 11.5 mV
Lead Channel Setting Pacing Amplitude: 1.6 V
Lead Channel Setting Pacing Amplitude: 3 V
Lead Channel Setting Pacing Pulse Width: 0.4 ms
Pulse Gen Model: 407145
Pulse Gen Serial Number: 70372674

## 2023-01-01 ENCOUNTER — Encounter: Payer: Self-pay | Admitting: Cardiology

## 2023-01-01 ENCOUNTER — Ambulatory Visit: Payer: Medicare Other | Attending: Cardiology | Admitting: Cardiology

## 2023-01-01 VITALS — BP 140/66 | HR 77 | Ht 62.0 in | Wt 123.6 lb

## 2023-01-01 DIAGNOSIS — E782 Mixed hyperlipidemia: Secondary | ICD-10-CM | POA: Insufficient documentation

## 2023-01-01 DIAGNOSIS — Z950 Presence of cardiac pacemaker: Secondary | ICD-10-CM | POA: Diagnosis not present

## 2023-01-01 DIAGNOSIS — R0602 Shortness of breath: Secondary | ICD-10-CM | POA: Insufficient documentation

## 2023-01-01 DIAGNOSIS — I480 Paroxysmal atrial fibrillation: Secondary | ICD-10-CM | POA: Diagnosis not present

## 2023-01-01 DIAGNOSIS — I5022 Chronic systolic (congestive) heart failure: Secondary | ICD-10-CM | POA: Insufficient documentation

## 2023-01-01 DIAGNOSIS — I10 Essential (primary) hypertension: Secondary | ICD-10-CM | POA: Diagnosis not present

## 2023-01-01 DIAGNOSIS — I6523 Occlusion and stenosis of bilateral carotid arteries: Secondary | ICD-10-CM | POA: Diagnosis not present

## 2023-01-01 NOTE — Patient Instructions (Signed)
Medication Instructions:  Your physician recommends that you continue on your current medications as directed. Please refer to the Current Medication list given to you today.  *If you need a refill on your cardiac medications before your next appointment, please call your pharmacy*   Lab Work: none If you have labs (blood work) drawn today and your tests are completely normal, you will receive your results only by: MyChart Message (if you have MyChart) OR A paper copy in the mail If you have any lab test that is abnormal or we need to change your treatment, we will call you to review the results.   Testing/Procedures: Your physician has requested that you have an echocardiogram. Echocardiography is a painless test that uses sound waves to create images of your heart. It provides your doctor with information about the size and shape of your heart and how well your heart's chambers and valves are working.   You may receive an ultrasound enhancing agent through an IV if needed to better visualize your heart during the echo. This procedure takes approximately one hour.  There are no restrictions for this procedure.  This will take place at [address], [place] [address]    Follow-Up: At [facility], you and your health needs are our priority.  As part of our continuing mission to provide you with exceptional heart care, we have created designated Provider Care Teams.  These Care Teams include your primary Cardiologist (physician) and Advanced Practice Providers (APPs -  Physician Assistants and Nurse Practitioners) who all work together to provide you with the care you need, when you need it.  We recommend signing up for the patient portal called "MyChart".  Sign up information is provided on this After Visit Summary.  MyChart is used to connect with patients for Virtual Visits (Telemedicine).  Patients are able to view lab/test results, encounter  notes, upcoming appointments, etc.  Non-urgent messages can be sent to your provider as well.   To learn more about what you can do with MyChart, go to [email or web address].    Your next appointment:   6 month(s)  Provider:   You may see [name], MD or one of the following Advanced Practice Providers on your designated Care Team:   Charlsie Quest, NP

## 2023-01-01 NOTE — Addendum Note (Signed)
Addended by: Vivi Barrack on: 01/01/2023 11:43 AM   Modules accepted: Orders

## 2023-01-01 NOTE — Progress Notes (Signed)
Cardiology Office Note:  .   Date:  01/01/2023  ID:  Sydney Mcdaniel, DOB 03-Sep-1924, MRN [id number] PCP: Margarita Mail, DO  [facility] Providers Cardiologist:  [name], MD    History of Present Illness: .   Sydney Mcdaniel is a 89 y.o. female with past medical history of HFrEF, paroxysmal atrial fibrillation, syncope, carotid atherosclerosis, sick sinus syndrome status post pacemaker implantation in 3/23, hypertension, hyperlipidemia, who presents to clinic today for follow-up of paroxysmal atrial fibrillation and HFrEF.  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  She was last seen in clinic 07/18/2022 accompanied by her daughter and stated that she was feeling well.  She been given a log of her blood pressures that were much improved.  Denied any symptoms of lightheadedness/dizziness, chest pain, shortness of breath.  Medication changes made and no further testing that was ordered at that time.  She returns to clinic today accompanied by family member.  States that today she has been doing well.  Unfortunately over the last several weeks she is calling today stating that she has just had weakness to her family member.  They did follow-up with her  primary care provider.  She had blood work that was completed as well as urine cultures that were completed that did not grow anything.  At that time she noted worsening shortness of breath but denied any palpitations or fast heart rate provide atrial fibrillation.  She also noted she had 1 or 2 drops in her blood pressure.  At previous appointment with Dr. Graciela Husbands medication changes were made and she was advised to take amlodipine daily and to take her lisinopril if her systolic blood pressure was greater than 160.  Blood pressure log brought in today continues to have patient blood pressure running 120s-140 mmHg systolic blood pressure which is well-controlled for her.  She denies any hospitalizations or visits to the emergency department.  ROS: 10 point review of systems was completed and considered negative with exception of what is listed in the HPI  Studies Reviewed: Marland Kitchen   EKG Interpretation Date/Time:  Monday January 01 2023 09:59:40 EDT Ventricular Rate:  77 PR Interval:    QRS Duration:  136 QT Interval:  430 QTC Calculation: 486 R Axis:   39  Text Interpretation: Atrial fibrillation Non-specific intra-ventricular conduction block Minimal voltage criteria for LVH, may be normal variant ( Cornell product ) Nonspecific T wave abnormality When compared with ECG of 30-Jan-2022 12:58, Atrial fibrillation has replaced Electronic atrial pacemaker QRS axis Shifted right Nonspecific T wave abnormality now evident in Inferior leads T wave inversion now evident in Lateral leads Confirmed by Charlsie Quest ([id number]) on 01/01/2023 10:11:26 AM   TTE 07/13/21 1. Left ventricular ejection fraction, by estimation, is 40 to 45%. The  left ventricle has mild to  moderately decreased function. The left  ventricle demonstrates regional wall motion abnormalities (see scoring  diagram/findings for description). Left  ventricular diastolic parameters are consistent with Grade II diastolic  dysfunction  (pseudonormalization). There is severe hypokinesis of the left  ventricular, basal-mid anteroseptal wall.   2. Right ventricular systolic function is low normal. The right  ventricular size is normal.   3. The mitral valve is normal in structure. Mild mitral valve  regurgitation.   4. The aortic valve is tricuspid. Aortic valve regurgitation is mild.  Aortic valve sclerosis is present, with no evidence of aortic valve  stenosis.   5. The inferior vena cava is normal in size with greater than 50%  respiratory variability, suggesting right atrial pressure of 3 mmHg.  Risk Assessment/Calculations:    CHA2DS2-VASc Score = 5   This indicates a 7.2% annual risk of stroke. The patient's score is based upon: CHF History: 1 HTN History: 1 Diabetes History: 0 Stroke History: 0 Vascular Disease History: 0 Age Score: 2 Gender Score: 1         Physical Exam:   VS:  BP (!) 140/66 (BP Location: Left Arm, Patient Position: Sitting, Cuff Size: Normal)   Pulse 77   Ht 5\' 2"  (1.575 m)   Wt 123 lb 9.6 oz (56.1 kg)   LMP  (LMP Unknown)   SpO2 98%   BMI 22.61 kg/m    Wt Readings from Last 3 Encounters:  01/01/23 123 lb 9.6 oz (56.1 kg)  12/11/22 123 lb 9.6 oz (56.1 kg)  11/15/22 123 lb 6.4 oz (56 kg)    GEN: Well nourished, well developed in no acute distress NECK: No JVD; No carotid bruits CARDIAC: IR IR, no murmurs, rubs, gallops RESPIRATORY:  Clear to auscultation without rales, wheezing or rhonchi  ABDOMEN: Soft, non-tender, non-distended EXTREMITIES:  trace edema; No deformity   ASSESSMENT AND PLAN: .   Heart failure with moderately reduced ejection fraction last echocardiogram revealed an LVEF of 40-45%.  On exam she appears euvolemic.  Denies any current shortness of breath.  Patient had noticed some shortness of breath when she was complaining of weakness prior to her appointment.  Will repeat echocardiogram to reevaluate symptoms.  She is continued on Aldactone 25 mg daily, and  lisinopril as needed for blood pressures of 160 mgHg.  She does have some orthostatic symptoms on occasion send escalating GDMT is limited.  Primary hypertension with recurrent symptoms of orthostatic lightheadedness and dizziness.  Blood pressure today is 140/66.  She had 1 potential episode of orthostatic hypotension/vasovagal at the beginning of June but she did not lose consciousness at that time she was just dizzy and lightheaded and there was a noted drop in her blood pressure.  She is continued on amlodipine 2.5 mg daily and lisinopril 10 mg as needed for systolic blood pressure greater than 160 as well as her spironolactone 25 mg daily.  Encouraged to continue to monitor her blood pressure and heart rate at home.  Paroxysmal atrial fibrillation who is currently in A-fib on EKG today that is rate controlled.  She is continued on Xarelto 20 mg daily for CHA2DS2-VASc score of at least 5.  She denies any bleeding or blood noted in her urine or stool.  Recent blood work revealed CBC with stable hemoglobin.  Sick sinus syndrome with cardiac pacemaker in situ" follow-up with device clinic and with primary EP Dr. Graciela Husbands.  Hyperlipidemia with an LDL of 90.  She is continued on Lipitor 20 mg  daily.  This continues to be monitored by her PCP.  Carotid artery disease with carotid ultrasound January 2023 showed right ICA 1-39%, ICA less than 50%, left ICA 40-59%, ECA greater than 50%.  Stable from 2021.  Continue with surveillance studies, scheduled for carotid duplex when she comes in for echocardiogram.       Dispo: Patient return to clinic to see MD/APP in 6 months or sooner.  Patient and family member advised that if echocardiogram is abnormal and changes need to be made then likely follow-up appointment with me moved to sooner date.  Signed, Duane Earnshaw, NP

## 2023-01-04 NOTE — Progress Notes (Signed)
Remote pacemaker transmission.   

## 2023-01-18 ENCOUNTER — Ambulatory Visit: Payer: Self-pay | Admitting: *Deleted

## 2023-01-18 NOTE — Patient Outreach (Signed)
  Care Coordination   01/18/2023 Name: Sydney Mcdaniel MRN: [id number] DOB: 22-Jun-1925   Care Coordination Outreach Attempts:  An unsuccessful telephone outreach was attempted for a scheduled appointment today.  Follow Up Plan:  Additional outreach attempts will be made to offer the patient care coordination information and services.   Encounter Outcome:  No Answer   Care Coordination Interventions:  No, not indicated    Kemper Durie, RN, MSN, [facility] Care Management Care Management Coordinator [phone]

## 2023-01-25 ENCOUNTER — Other Ambulatory Visit: Payer: Medicare Other

## 2023-01-30 ENCOUNTER — Other Ambulatory Visit: Payer: Self-pay | Admitting: Internal Medicine

## 2023-01-30 DIAGNOSIS — E782 Mixed hyperlipidemia: Secondary | ICD-10-CM

## 2023-01-31 ENCOUNTER — Telehealth: Payer: Self-pay | Admitting: *Deleted

## 2023-01-31 ENCOUNTER — Other Ambulatory Visit: Payer: Self-pay | Admitting: Nurse Practitioner

## 2023-01-31 DIAGNOSIS — I480 Paroxysmal atrial fibrillation: Secondary | ICD-10-CM

## 2023-01-31 NOTE — Telephone Encounter (Signed)
Requested medication (s) are due for refill today: yes  Requested medication (s) are on the active medication list: yes  Last refill:  (11/03/2022)   Future visit scheduled: yes   Notes to clinic:  2 years overdue lab work   Requested Prescriptions  Pending Prescriptions Disp Refills   atorvastatin (LIPITOR) 20 MG tablet [Pharmacy Med Name: Atorvastatin Calcium 20 MG Oral Tablet] 90 tablet 0    Sig: TAKE 1 TABLET BY MOUTH ONCE DAILY IN THE EVENING     Cardiovascular:  Antilipid - Statins Failed - 01/30/2023 10:36 AM      Failed - Lipid Panel in normal range within the last 12 months    Cholesterol, Total  Date Value Ref Range Status  11/17/2020 171 100 - 199 mg/dL Final   LDL Cholesterol (Calc)  Date Value Ref Range Status  07/02/2018 90 mg/dL (calc) Final    Comment:    Reference range: <100 . Desirable range <100 mg/dL for primary prevention;   <70 mg/dL for patients with CHD or diabetic patients  with > or = 2 CHD risk factors. Marland Kitchen LDL-C is now calculated using the Martin-Hopkins  calculation, which is a validated novel method providing  better accuracy than the Friedewald equation in the  estimation of LDL-C.  Horald Pollen et al. Lenox Ahr. 5784;696(29): 2061-2068  (http://education.[facility].com/faq/FAQ164)    LDL Chol Calc (NIH)  Date Value Ref Range Status  11/17/2020 90 0 - 99 mg/dL Final   HDL  Date Value Ref Range Status  11/17/2020 55 >39 mg/dL Final   Triglycerides  Date Value Ref Range Status  11/17/2020 147 0 - 149 mg/dL Final         Passed - Patient is not pregnant      Passed - Valid encounter within last 12 months    Recent Outpatient Visits           1 month ago Fatigue, unspecified type   [facility] Mecum, Oswaldo Conroy, PA-C   2 months ago Hypothyroidism, unspecified type   [facility] Margarita Mail, DO   6 months ago Encounter for [employer] annual wellness exam   [facility] Mecum, Oswaldo Conroy, PA-C   8 months ago Hypothyroidism, unspecified type   [facility] Margarita Mail, DO   12 months ago Hyponatremia   [facility] Margarita Mail, DO       Future Appointments             In 3 months Margarita Mail, DO [facility]

## 2023-01-31 NOTE — Patient Outreach (Signed)
  Care Coordination   Follow Up Visit Note   02/01/2023 Name: RUSTY MANSER MRN: [id number] DOB: 1925/06/25  CAILYNN LEFRANCOIS is a 89 y.o. year old female who sees Margarita Mail, DO for primary care. I spoke with Clydie Braun, daughter of ROTUNDA GENSER by phone today.  What matters to the patients health and wellness today?  Report having intermittent days of feeling "weak" but denies dizziness.  Daughter working to help with improving strength.     Goals Addressed             This Visit's Progress    RNCM: Effective Management of health and well being   On track    Care Coordination Interventions: Evaluation of current treatment plan related to hyponatremia and hypertension and patient's adherence to plan as established by provider Advised patient to call the office for changes in conditions, new questions or concerns Reviewed medications with patient and discussed adherence and affordability.  Discussed plans with patient for ongoing care management follow up and provided patient with direct contact information for care management team Advised patient to discuss changes in vision with provider            SDOH assessments and interventions completed:  No     Care Coordination Interventions:  Yes, provided   Interventions Today    Flowsheet Row Most Recent Value  Chronic Disease   Chronic disease during today's visit Congestive Heart Failure (CHF), Atrial Fibrillation (AFib)  General Interventions   General Interventions Discussed/Reviewed General Interventions Reviewed, Doctor Visits, Durable Medical Equipment (DME)  Doctor Visits Discussed/Reviewed Doctor Visits Reviewed, PCP, Specialist  [Carotid ultrasound and echo on 8/22, Nephrology on 8/26.  Will follow up with cardiology after cardiac testing is done]  Durable Medical Equipment (DME) BP Cuff, Glucomoter  PCP/Specialist Visits Compliance with follow-up visit  Exercise Interventions   Exercise Discussed/Reviewed  Weight Managment  Weight Management Weight maintenance  [stable at 120 pounds]  Education Interventions   Education Provided Provided Education  Provided Verbal Education On Medication, When to see the doctor  [Blood pressure range 130s/50-60s, denies dizziness or hypotension during "weak" episodes]        Follow up plan: Follow up call scheduled for 9/5    Encounter Outcome:  Pt. Visit Completed   Kemper Durie, RN, MSN, [facility] Care Management Care Management Coordinator [phone]

## 2023-02-01 NOTE — Telephone Encounter (Signed)
Prescription refill request for Xarelto received.  Indication: Afib  Last office visit: 01/01/23 ([place])  Weight: 56.1kg Age: 89 Scr: 0.88 (12/11/22)  CrCl: 33.83ml/min  Appropriate dose. Refill sent.

## 2023-02-06 ENCOUNTER — Other Ambulatory Visit: Payer: Self-pay | Admitting: Cardiology

## 2023-02-06 DIAGNOSIS — I6523 Occlusion and stenosis of bilateral carotid arteries: Secondary | ICD-10-CM

## 2023-02-06 DIAGNOSIS — I480 Paroxysmal atrial fibrillation: Secondary | ICD-10-CM

## 2023-02-06 DIAGNOSIS — I5022 Chronic systolic (congestive) heart failure: Secondary | ICD-10-CM

## 2023-02-06 DIAGNOSIS — I10 Essential (primary) hypertension: Secondary | ICD-10-CM

## 2023-02-06 DIAGNOSIS — R0602 Shortness of breath: Secondary | ICD-10-CM

## 2023-02-06 DIAGNOSIS — E782 Mixed hyperlipidemia: Secondary | ICD-10-CM

## 2023-02-06 DIAGNOSIS — Z950 Presence of cardiac pacemaker: Secondary | ICD-10-CM

## 2023-02-15 ENCOUNTER — Ambulatory Visit: Payer: Medicare Other

## 2023-02-19 DIAGNOSIS — I10 Essential (primary) hypertension: Secondary | ICD-10-CM | POA: Diagnosis not present

## 2023-02-19 DIAGNOSIS — C9190 Lymphoid leukemia, unspecified not having achieved remission: Secondary | ICD-10-CM | POA: Diagnosis not present

## 2023-02-19 DIAGNOSIS — N182 Chronic kidney disease, stage 2 (mild): Secondary | ICD-10-CM | POA: Diagnosis not present

## 2023-02-19 DIAGNOSIS — E871 Hypo-osmolality and hyponatremia: Secondary | ICD-10-CM | POA: Diagnosis not present

## 2023-03-01 ENCOUNTER — Ambulatory Visit: Payer: Self-pay | Admitting: *Deleted

## 2023-03-01 NOTE — Patient Outreach (Signed)
  Care Coordination   Follow Up Visit Note   03/01/2023 Name: Sydney Mcdaniel MRN: [id number] DOB: 17-Feb-1925  Sydney Mcdaniel is a 89 y.o. year old female who sees Margarita Mail, DO for primary care. I spoke with Clydie Braun, daughter of Sydney Mcdaniel by phone today.  What matters to the patients health and wellness today?  Find out reason for intermittent weakness and fatigue.  Carotid ultrasound and echo that was scheduled last month were canceled by provider and rescheduled for next week.    Goals Addressed             This Visit's Progress    RNCM: Effective Management of health and well being   On track    Care Coordination Interventions: Evaluation of current treatment plan related to hyponatremia and hypertension and patient's adherence to plan as established by provider Advised patient to call the office for changes in conditions, new questions or concerns Reviewed medications with patient and discussed adherence and affordability.  Discussed plans with patient for ongoing care management follow up and provided patient with direct contact information for care management team Advised patient to discuss changes in vision with provider            SDOH assessments and interventions completed:  No     Care Coordination Interventions:  Yes, provided   Interventions Today    Flowsheet Row Most Recent Value  Chronic Disease   Chronic disease during today's visit Other, Congestive Heart Failure (CHF), Atrial Fibrillation (AFib), Hypertension (HTN)  General Interventions   General Interventions Discussed/Reviewed General Interventions Reviewed, Doctor Visits  Doctor Visits Discussed/Reviewed Doctor Visits Reviewed, PCP, Specialist  [facility] 11/14, echo and carotic ultrasound rescheduled for 9/13]  PCP/Specialist Visits Compliance with follow-up visit  Exercise Interventions   Exercise Discussed/Reviewed Weight Managment  Weight Management Weight maintenance  [today 117  pounds]  Education Interventions   Education Provided Provided Education  Provided Verbal Education On Nutrition, Medication, When to see the doctor  Safety Interventions   Safety Discussed/Reviewed Fall Risk, Safety Reviewed       Follow up plan: Follow up call scheduled for 10/2    Encounter Outcome:  Patient Visit Completed   Kemper Durie, RN, MSN, [facility] Care Management Care Management Coordinator [phone]

## 2023-03-02 ENCOUNTER — Other Ambulatory Visit: Payer: Self-pay | Admitting: Internal Medicine

## 2023-03-02 ENCOUNTER — Other Ambulatory Visit: Payer: Self-pay | Admitting: Cardiology

## 2023-03-02 DIAGNOSIS — I5022 Chronic systolic (congestive) heart failure: Secondary | ICD-10-CM

## 2023-03-02 DIAGNOSIS — R0602 Shortness of breath: Secondary | ICD-10-CM

## 2023-03-02 DIAGNOSIS — I6523 Occlusion and stenosis of bilateral carotid arteries: Secondary | ICD-10-CM

## 2023-03-02 DIAGNOSIS — E782 Mixed hyperlipidemia: Secondary | ICD-10-CM

## 2023-03-02 DIAGNOSIS — I480 Paroxysmal atrial fibrillation: Secondary | ICD-10-CM

## 2023-03-02 DIAGNOSIS — I10 Essential (primary) hypertension: Secondary | ICD-10-CM

## 2023-03-02 DIAGNOSIS — Z950 Presence of cardiac pacemaker: Secondary | ICD-10-CM

## 2023-03-09 ENCOUNTER — Ambulatory Visit: Payer: Medicare Other | Attending: Cardiology

## 2023-03-09 ENCOUNTER — Ambulatory Visit: Payer: Medicare Other

## 2023-03-09 DIAGNOSIS — I480 Paroxysmal atrial fibrillation: Secondary | ICD-10-CM | POA: Insufficient documentation

## 2023-03-09 DIAGNOSIS — I6523 Occlusion and stenosis of bilateral carotid arteries: Secondary | ICD-10-CM

## 2023-03-09 DIAGNOSIS — R0602 Shortness of breath: Secondary | ICD-10-CM | POA: Diagnosis present

## 2023-03-09 LAB — ECHOCARDIOGRAM COMPLETE
AR max vel: 2.2 cm2
AV Area VTI: 1.79 cm2
AV Area mean vel: 2.07 cm2
AV Mean grad: 4 mmHg
AV Peak grad: 7 mmHg
AV Vena cont: 0.4 cm
Ao pk vel: 1.32 m/s
Calc EF: 44.1 %
P 1/2 time: 343 ms
S' Lateral: 3.1 cm
Single Plane A2C EF: 46.6 %
Single Plane A4C EF: 42.1 %

## 2023-03-13 NOTE — Progress Notes (Signed)
Minimal stenosis (1-39%) noted in the bilateral carotids.  Unchanged from prior studies.

## 2023-03-15 NOTE — Progress Notes (Signed)
Heart squeeze has remained of 40-45%.  This is unchanged from prior study she had in January 2023.  Recommended decreasing sodium intake and monitoring overall fluid intake to be no more than 1.5 L in 24 hours.  If she has any provide be beneficial to weigh daily.  Continue current medication regimen without any changes at this time.

## 2023-03-21 DIAGNOSIS — Z23 Encounter for immunization: Secondary | ICD-10-CM | POA: Diagnosis not present

## 2023-03-23 ENCOUNTER — Ambulatory Visit (INDEPENDENT_AMBULATORY_CARE_PROVIDER_SITE_OTHER): Payer: Medicare Other

## 2023-03-23 DIAGNOSIS — I429 Cardiomyopathy, unspecified: Secondary | ICD-10-CM

## 2023-03-23 LAB — CUP PACEART REMOTE DEVICE CHECK
Date Time Interrogation Session: 20240927091931
Implantable Lead Connection Status: 753985
Implantable Lead Connection Status: 753985
Implantable Lead Implant Date: 20230330
Implantable Lead Implant Date: 20230330
Implantable Lead Location: 753859
Implantable Lead Location: 753860
Implantable Lead Model: 377
Implantable Lead Model: 399
Implantable Lead Serial Number: 8000191707
Implantable Lead Serial Number: 8000584009
Implantable Pulse Generator Implant Date: 20230330
Pulse Gen Model: 407145
Pulse Gen Serial Number: 70372674

## 2023-03-28 ENCOUNTER — Ambulatory Visit: Payer: Self-pay | Admitting: *Deleted

## 2023-03-28 NOTE — Patient Outreach (Signed)
Care Coordination   Follow Up Visit Note   03/28/2023 Name: SHAQUIA COOPERMAN MRN: [id number] DOB: [birth year]  TYSHEMA KULT is a 89 y.o. year old female who sees Margarita Mail, DO for primary care. I spoke with Daughter of GEETHA HORNICK by phone today.  What matters to the patients health and wellness today?  Daughter confirms echo and carotid studies were done, both with no changes from previous studies.  Patient's appetite has slightly decreased, using supplements.  Will see PCP  in November.  Denies any urgent concerns, encouraged to contact this care manager with questions.     Goals Addressed             This Visit's Progress    COMPLETED: managment of new onset symptoms   On track       Interventions Today    Flowsheet Row Most Recent Value  Chronic Disease   Chronic disease during today's visit Congestive Heart Failure (CHF), Atrial Fibrillation (AFib)  General Interventions   General Interventions Discussed/Reviewed General Interventions Reviewed, Doctor Visits  Doctor Visits Discussed/Reviewed Doctor Visits Reviewed, PCP, Specialist  PCP/Specialist Visits Compliance with follow-up visit  Education Interventions   Education Provided Provided Education  Provided Verbal Education On Nutrition, Medication, When to see the doctor  Nutrition Interventions   Nutrition Discussed/Reviewed Nutrition Reviewed, Adding fruits and vegetables, Fluid intake           RNCM: Effective Management of health and well being   On track    Care Coordination Interventions: Evaluation of current treatment plan related to hyponatremia and hypertension and patient's adherence to plan as established by provider Advised patient to call the office for changes in conditions, new questions or concerns Reviewed medications with patient and discussed adherence and affordability.  Discussed plans with patient for ongoing care management follow up and provided patient with direct contact  information for care management team Advised patient to discuss changes in vision with provider            SDOH assessments and interventions completed:  No     Care Coordination Interventions:  Yes, provided   Follow up plan: Follow up call scheduled for 11/19    Encounter Outcome:  Patient Visit Completed   Kemper Durie, RN, MSN, [facility] Care Management Care Management Coordinator [phone]

## 2023-03-29 NOTE — Progress Notes (Signed)
Remote pacemaker transmission.   

## 2023-04-02 DIAGNOSIS — Z961 Presence of intraocular lens: Secondary | ICD-10-CM | POA: Diagnosis not present

## 2023-04-02 DIAGNOSIS — H18221 Idiopathic corneal edema, right eye: Secondary | ICD-10-CM | POA: Diagnosis not present

## 2023-04-02 DIAGNOSIS — H401133 Primary open-angle glaucoma, bilateral, severe stage: Secondary | ICD-10-CM | POA: Diagnosis not present

## 2023-04-06 ENCOUNTER — Other Ambulatory Visit: Payer: Self-pay | Admitting: Internal Medicine

## 2023-04-06 DIAGNOSIS — E039 Hypothyroidism, unspecified: Secondary | ICD-10-CM

## 2023-04-06 NOTE — Telephone Encounter (Signed)
Requested Prescriptions  Pending Prescriptions Disp Refills   levothyroxine (SYNTHROID) 75 MCG tablet [Pharmacy Med Name: Levothyroxine Sodium 75 MCG Oral Tablet] 52 tablet 0    Sig: TAKE ONE TABLET BY MOUTH ON MONDAYS WEDNESDAYS FRIDAYS AND SUNDAYS     Endocrinology:  Hypothyroid Agents Passed - 04/06/2023  9:33 AM      Passed - TSH in normal range and within 360 days    TSH  Date Value Ref Range Status  12/11/2022 1.18 0.40 - 4.50 mIU/L Final         Passed - Valid encounter within last 12 months    Recent Outpatient Visits           3 months ago Fatigue, unspecified type   [facility] Mecum, Oswaldo Conroy, PA-C   5 months ago Hypothyroidism, unspecified type   [facility] Margarita Mail, DO   8 months ago Encounter for [employer] annual wellness exam   [facility] Mecum, Oswaldo Conroy, PA-C   11 months ago Hypothyroidism, unspecified type   [facility] Margarita Mail, DO   1 year ago Hyponatremia   [facility] Margarita Mail, DO       Future Appointments             In 1 month Margarita Mail, DO [facility]

## 2023-04-27 ENCOUNTER — Other Ambulatory Visit: Payer: Self-pay | Admitting: Internal Medicine

## 2023-04-27 DIAGNOSIS — E782 Mixed hyperlipidemia: Secondary | ICD-10-CM

## 2023-04-27 NOTE — Telephone Encounter (Signed)
Requested medication (s) are due for refill today:yes  Requested medication (s) are on the active medication list:yes  Last refill:  01/31/23 #90   Future visit scheduled: yes  Notes to clinic:  overdue lab work   Requested Prescriptions  Pending Prescriptions Disp Refills   atorvastatin (LIPITOR) 20 MG tablet [Pharmacy Med Name: Atorvastatin Calcium 20 MG Oral Tablet] 90 tablet 0    Sig: TAKE 1 TABLET BY MOUTH ONCE DAILY IN THE EVENING     Cardiovascular:  Antilipid - Statins Failed - 04/27/2023  9:59 AM      Failed - Lipid Panel in normal range within the last 12 months    Cholesterol, Total  Date Value Ref Range Status  11/17/2020 171 100 - 199 mg/dL Final   LDL Cholesterol (Calc)  Date Value Ref Range Status  07/02/2018 90 mg/dL (calc) Final    Comment:    Reference range: <100 . Desirable range <100 mg/dL for primary prevention;   <70 mg/dL for patients with CHD or diabetic patients  with > or = 2 CHD risk factors. Marland Kitchen LDL-C is now calculated using the Martin-Hopkins  calculation, which is a validated novel method providing  better accuracy than the Friedewald equation in the  estimation of LDL-C.  Horald Pollen et al. Lenox Ahr. 1610;960(45): 2061-2068  (http://education.[facility].com/faq/FAQ164)    LDL Chol Calc (NIH)  Date Value Ref Range Status  11/17/2020 90 0 - 99 mg/dL Final   HDL  Date Value Ref Range Status  11/17/2020 55 >39 mg/dL Final   Triglycerides  Date Value Ref Range Status  11/17/2020 147 0 - 149 mg/dL Final         Passed - Patient is not pregnant      Passed - Valid encounter within last 12 months    Recent Outpatient Visits           4 months ago Fatigue, unspecified type   [facility] Mecum, Erin E, PA-C   5 months ago Hypothyroidism, unspecified type   [facility] Margarita Mail, DO   9 months ago Encounter for [employer] annual wellness exam   [facility] Mecum, Oswaldo Conroy, PA-C   11 months ago Hypothyroidism, unspecified type   [facility] Margarita Mail, DO   1 year ago Hyponatremia   [facility] Margarita Mail, DO       Future Appointments             In 1 week Margarita Mail, DO [facility], [facility]

## 2023-05-09 ENCOUNTER — Other Ambulatory Visit: Payer: Self-pay | Admitting: Internal Medicine

## 2023-05-09 DIAGNOSIS — M810 Age-related osteoporosis without current pathological fracture: Secondary | ICD-10-CM

## 2023-05-09 NOTE — Progress Notes (Unsigned)
Established Patient Office Visit  Subjective   Patient ID: Sydney Mcdaniel, female    DOB: 07/03/1924  Age: 89 y.o. MRN: [id number]  No chief complaint on file.   HPI Sydney Mcdaniel is here for follow up on chronic medical conditions. She is here with her daughter.   Hypertension/A.Fib/CHF: -Medications: Lisinopril 10 mg, Amlodipine 2.5 mg, Xarelto 20 mg (trying to get Eliquis at a lower cost but so far unsuccessful), Spirolactone 25 mg.  -Patient is compliant with above medications and reports no side effects at this point. -Checking BP at home (average): 130-145/65-80 -Denies any SOB, CP, vision changes or symptoms of hypotension. Does have some mild chronic BLE edema -Following with Cardiology, note from 09/28/22 reviewed  -Last TTE in 1/23 with EF of 40-45% -ICD implanted on 09/22/21, checked on 10/05/21. Doing very well since pacemaker was implanted.  Blood pressure has been stable.  She denies chest pain, palpitations, shortness of breath, dizziness.  HLD: -Medications: Lipitor 20 mg -Patient is compliant with above medications and reports no side effects.  -Last lipid panel: Lipid Panel     Component Value Date/Time   CHOL 171 11/17/2020 0855   TRIG 147 11/17/2020 0855   HDL 55 11/17/2020 0855   CHOLHDL 3.1 11/17/2020 0855   CHOLHDL 3.1 07/30/2019 0925   VLDL 22 07/30/2019 0925   LDLCALC 90 11/17/2020 0855   LDLCALC 90 07/02/2018 1040   LABVLDL 26 11/17/2020 0855   Hypothyroidism: -Medications: Levothyroxine 88 mcg on Tuesdays, Thursdays and Saturdays and 75 mcg on Mondays, Wednesdays, Fridays and Sundays -Patient is compliant with the above medication (s) at the above dose and reports no medication side effects.  -Denies weight changes, cold./heat intolerance, skin changes, anxiety/palpitations  -Last TSH: 6/24 1/18  SIADH: -Following with Nephrology at [facility], note reviewed from 08/23/22.   -Last sodium 4/24 133, 131 8/23 -Currently on NaCl 1 g  daily   Osteoporosis:  -Currently on Fosamax 70 mg weekly for 4 years at this point  -DEXA results from 2/20 showed t score left femur -1.4 and in left forearm -2.8.  -Most recent results 3/24 showing t score left femur -1.9 and left forearm -2.7 -Not currently on Vitamin D or calcium   Health Maintenance: -Blood work UTD  Review of Systems  Constitutional:  Negative for chills and fever.  Eyes:  Negative for blurred vision.  Respiratory:  Negative for shortness of breath.   Cardiovascular:  Negative for chest pain and palpitations.  Musculoskeletal:  Negative for falls.  Neurological:  Negative for dizziness, tingling and headaches.      Objective:     LMP  (LMP Unknown)  BP Readings from Last 3 Encounters:  01/01/23 (!) 140/66  12/11/22 124/76  11/15/22 (!) 142/66   Wt Readings from Last 3 Encounters:  01/01/23 123 lb 9.6 oz (56.1 kg)  12/11/22 123 lb 9.6 oz (56.1 kg)  11/15/22 123 lb 6.4 oz (56 kg)    Physical Exam Constitutional:      Appearance: Normal appearance.  HENT:     Head: Normocephalic and atraumatic.  Eyes:     Conjunctiva/sclera: Conjunctivae normal.  Cardiovascular:     Rate and Rhythm: Normal rate and regular rhythm.  Pulmonary:     Effort: Pulmonary effort is normal.     Breath sounds: Normal breath sounds.  Musculoskeletal:     Right lower leg: Edema present.     Left lower leg: Edema present.  Skin:    General: Skin  is warm and dry.  Neurological:     General: No focal deficit present.     Mental Status: She is alert. Mental status is at baseline.  Psychiatric:        Mood and Affect: Mood normal.        Behavior: Behavior normal.      No results found for any visits on 05/10/23.  Last CBC Lab Results  Component Value Date   WBC 9.2 12/11/2022   HGB 12.9 12/11/2022   HCT 39.5 12/11/2022   MCV 93.4 12/11/2022   MCH 30.5 12/11/2022   RDW 13.5 12/11/2022   PLT 196 12/11/2022   Last metabolic panel Lab Results  Component  Value Date   GLUCOSE 97 12/11/2022   NA 136 12/11/2022   K 4.0 12/11/2022   CL 100 12/11/2022   CO2 30 12/11/2022   BUN 10 12/11/2022   CREATININE 0.88 12/11/2022   EGFR 60 12/11/2022   CALCIUM 9.5 12/11/2022   PROT 6.8 12/11/2022   ALBUMIN 3.8 01/31/2022   LABGLOB 2.4 06/22/2015   AGRATIO 1.6 06/22/2015   BILITOT 0.5 12/11/2022   ALKPHOS 26 (L) 01/31/2022   AST 17 12/11/2022   ALT 15 12/11/2022   ANIONGAP 7 09/26/2022   Last lipids Lab Results  Component Value Date   CHOL 171 11/17/2020   HDL 55 11/17/2020   LDLCALC 90 11/17/2020   TRIG 147 11/17/2020   CHOLHDL 3.1 11/17/2020   Last hemoglobin A1c Lab Results  Component Value Date   HGBA1C 6.0 (H) 07/02/2018   Last thyroid functions Lab Results  Component Value Date   TSH 1.18 12/11/2022   T4TOTAL 9.2 12/11/2022   Last vitamin D Lab Results  Component Value Date   VD25OH 57 12/11/2022   Last vitamin B12 and Folate Lab Results  Component Value Date   VITAMINB12 480 12/11/2022   FOLATE 22.0 11/16/2021      The ASCVD Risk score (Arnett DK, et al., 2019) failed to calculate for the following reasons:   The 2019 ASCVD risk score is only valid for ages 19 to 62    Assessment & Plan:   1. Hypothyroidism, unspecified type: Recheck thyroid labs today, for now continue current regimen of Levothyroxine 88 mcg on Tuesdays, Thursdays and Saturdays and 75 mcg on Mondays, Wednesdays, Fridays and Sundays. Medication due for refills, will wait until labs result.  - TSH  2. Essential hypertension/Paroxysmal atrial fibrillation [facility]): Blood pressure slightly high today, better on recheck. Blood pressure at home has been well controlled. Will recheck labs today. No changes to  medications made, continue Lisinopril 10 mg, Amlodipine 2.5 mg, Xarelto 20 mg, Spironolactone 25 mg. Following with Cardiology, note from 09/28/22 reviewed.  - COMPLETE METABOLIC PANEL WITH GFR  3. Mixed hyperlipidemia: Stable, continue Lipitor 20  mg.   4. SIADH (syndrome of inappropriate ADH production) (HCC): Recheck labs today, following with Nephrology, note from 08/23/22 reviewed.   - COMPLETE METABOLIC PANEL WITH GFR  5. Vaccine for streptococcus pneumoniae and influenza: Prevnar 20 administered today.   - Pneumococcal conjugate vaccine 20-valent (Prevnar 20)  6. Age-related osteoporosis without current pathological fracture: Reviewed most recent DEXA results, bone density stable. Continue Fosamax.    No follow-ups on file.    Margarita Mail, DO

## 2023-05-10 ENCOUNTER — Encounter: Payer: Self-pay | Admitting: Internal Medicine

## 2023-05-10 ENCOUNTER — Ambulatory Visit (INDEPENDENT_AMBULATORY_CARE_PROVIDER_SITE_OTHER): Payer: Medicare Other | Admitting: Internal Medicine

## 2023-05-10 VITALS — BP 142/62 | HR 105 | Temp 97.9°F | Resp 18 | Ht 62.0 in | Wt 116.3 lb

## 2023-05-10 DIAGNOSIS — I10 Essential (primary) hypertension: Secondary | ICD-10-CM

## 2023-05-10 DIAGNOSIS — E222 Syndrome of inappropriate secretion of antidiuretic hormone: Secondary | ICD-10-CM | POA: Diagnosis not present

## 2023-05-10 DIAGNOSIS — E039 Hypothyroidism, unspecified: Secondary | ICD-10-CM

## 2023-05-10 DIAGNOSIS — M810 Age-related osteoporosis without current pathological fracture: Secondary | ICD-10-CM

## 2023-05-10 DIAGNOSIS — E782 Mixed hyperlipidemia: Secondary | ICD-10-CM | POA: Diagnosis not present

## 2023-05-10 MED ORDER — LEVOTHYROXINE SODIUM 88 MCG PO TABS
ORAL_TABLET | ORAL | 1 refills | Status: AC
Start: 2023-05-10 — End: ?

## 2023-05-10 MED ORDER — ALENDRONATE SODIUM 70 MG PO TABS
ORAL_TABLET | ORAL | 1 refills | Status: DC
Start: 2023-05-10 — End: 2023-11-22

## 2023-05-10 NOTE — Telephone Encounter (Signed)
Requested medications are due for refill today.  yes  Requested medications are on the active medications list.  yes  Last refill. 05/09/2022 #12 1 rf  Future visit scheduled.   yes  Notes to clinic.  Missing and expired labs.    Requested Prescriptions  Pending Prescriptions Disp Refills   alendronate (FOSAMAX) 70 MG tablet [Pharmacy Med Name: Alendronate Sodium 70 MG Oral Tablet] 12 tablet 0    Sig: TAKE 1 TABLET BY MOUTH EVERY FRIDAY ON AN EMPTY STOMACH WITH A FULL GLASS OF WATER     Endocrinology:  Bisphosphonates Failed - 05/09/2023  9:48 AM      Failed - Mg Level in normal range and within 360 days    Magnesium  Date Value Ref Range Status  04/13/2020 2.0 1.7 - 2.4 mg/dL Final    Comment:    Performed at [facility], [address] [place], [place] [address]         Failed - Phosphate in normal range and within 360 days    No results found for: "PHOS"       Passed - Ca in normal range and within 360 days    Calcium  Date Value Ref Range Status  12/11/2022 9.5 8.6 - 10.4 mg/dL Final         Passed - Vitamin D in normal range and within 360 days    Vit D, 25-Hydroxy  Date Value Ref Range Status  12/11/2022 57 30 - 100 ng/mL Final    Comment:    Vitamin D Status         25-OH Vitamin D: . Deficiency:                    <20 ng/mL Insufficiency:             20 - 29 ng/mL Optimal:                 > or = 30 ng/mL . For 25-OH Vitamin D testing on patients on  D2-supplementation and patients for whom quantitation  of D2 and D3 fractions is required, the QuestAssureD(TM) 25-OH VIT D, (D2,D3), LC/MS/MS is recommended: order  code 47829 (patients >54yrs). . See Note 1 . Note 1 . For additional information, please refer to  http://education.[facility].com/faq/FAQ199  (This link is being provided for informational/ educational purposes only.)          Passed - Cr in normal range and within 360 days    Creat  Date Value Ref Range Status   12/11/2022 0.88 0.60 - 0.95 mg/dL Final   Creatinine, Urine  Date Value Ref Range Status  02/11/2021 23 mg/dL Final    Comment:    Performed at [facility], [address] [place], [place] [address]         Passed - eGFR is 30 or above and within 360 days    GFR, Est African American  Date Value Ref Range Status  10/01/2020 75 > OR = 60 mL/min/1.67m2 Final   GFR, Est Non African American  Date Value Ref Range Status  10/01/2020 65 > OR = 60 mL/min/1.33m2 Final   GFR, Estimated  Date Value Ref Range Status  09/26/2022 60 (L) >60 mL/min Final    Comment:    (NOTE) Calculated using the CKD-EPI Creatinine Equation (2021)    eGFR  Date Value Ref Range Status  12/11/2022 60 > OR = 60 mL/min/1.69m2 Final  09/13/2021 65 >59 mL/min/1.73 Final  Passed - Valid encounter within last 12 months    Recent Outpatient Visits           5 months ago Fatigue, unspecified type   [facility] Mecum, Erin E, PA-C   6 months ago Hypothyroidism, unspecified type   [facility] Margarita Mail, DO   9 months ago Encounter for [employer] annual wellness exam   [facility] Mecum, Oswaldo Conroy, PA-C   1 year ago Hypothyroidism, unspecified type   [facility] Margarita Mail, DO   1 year ago Hyponatremia   [facility] Margarita Mail, DO       Future Appointments             Today Margarita Mail, DO [facility]            Passed - Bone Mineral Density or Dexa Scan completed in the last 2 years

## 2023-05-15 ENCOUNTER — Ambulatory Visit: Payer: Self-pay | Admitting: *Deleted

## 2023-05-15 NOTE — Patient Outreach (Signed)
  Care Coordination   Follow Up Visit Note   05/15/2023 Name: Sydney Mcdaniel MRN: [id number] DOB: 02-05-1925  Sydney Mcdaniel is a 89 y.o. year old female who sees Margarita Mail, DO for primary care. I spoke with Sydney Mcdaniel, daughter of Sydney Mcdaniel by phone today.  What matters to the patients health and wellness today?  Sydney Mcdaniel report patient has been doing good but stated she was not feeling well today and yesterday.  Daughter state the only change in diet and health management was having donuts yesterday.  She will have  her not eat anymore and see how she feels.  Denies any urgent concerns, encouraged to contact this care manager with questions.      Goals Addressed             This Visit's Progress    RNCM: Effective Management of health and well being   On track    Care Coordination Interventions: Evaluation of current treatment plan related to hyponatremia and hypertension and patient's adherence to plan as established by provider Advised patient to call the office for changes in conditions, new questions or concerns Reviewed medications with patient and discussed adherence and affordability.  Discussed plans with patient for ongoing care management follow up and provided patient with direct contact information for care management team Advised patient to discuss changes in vision with provider            SDOH assessments and interventions completed:  No     Care Coordination Interventions:  Yes, provided   Interventions Today    Flowsheet Row Most Recent Value  Chronic Disease   Chronic disease during today's visit Atrial Fibrillation (AFib), Congestive Heart Failure (CHF), Hypertension (HTN)  General Interventions   General Interventions Discussed/Reviewed General Interventions Reviewed, [facility], Vaccines, Doctor Visits  Vaccines Flu, COVID-19  Doctor Visits Discussed/Reviewed Doctor Visits Reviewed, PCP, Specialist  [upcoming with cardiology  12/27, nephrology 2/17]  PCP/Specialist Visits Compliance with follow-up visit  [PCP visit done last week, next scheduled for 6 months]  Exercise Interventions   Exercise Discussed/Reviewed Weight Managment  Weight Management Weight maintenance  Education Interventions   Education Provided Provided Education  Provided Verbal Education On Nutrition, Medication, When to see the doctor  [Discussed recent office visit, no change in medications. Continue to monitor BP, HR, and weights daily]  Nutrition Interventions   Nutrition Discussed/Reviewed Nutrition Reviewed, Decreasing sugar intake, Adding fruits and vegetables  [Report having donuts recently after not having in a while, now not feeling well, daughter will decrease sugar intake]       Follow up plan: Follow up call scheduled for 1/15    Encounter Outcome:  Patient Visit Completed   Rodney Langton, RN, MSN, CCM [place]  [facility], [facility] RN Care Coordinator Direct Dial: [phone] / Main [phone] Fax [phone] Email: [email or web address]: [email or web address]

## 2023-06-01 ENCOUNTER — Other Ambulatory Visit: Payer: Self-pay | Admitting: Internal Medicine

## 2023-06-04 DIAGNOSIS — Z9889 Other specified postprocedural states: Secondary | ICD-10-CM | POA: Diagnosis not present

## 2023-06-04 DIAGNOSIS — H401133 Primary open-angle glaucoma, bilateral, severe stage: Secondary | ICD-10-CM | POA: Diagnosis not present

## 2023-06-04 DIAGNOSIS — Z961 Presence of intraocular lens: Secondary | ICD-10-CM | POA: Diagnosis not present

## 2023-06-04 DIAGNOSIS — H18221 Idiopathic corneal edema, right eye: Secondary | ICD-10-CM | POA: Diagnosis not present

## 2023-06-04 DIAGNOSIS — H18413 Arcus senilis, bilateral: Secondary | ICD-10-CM | POA: Diagnosis not present

## 2023-06-15 ENCOUNTER — Other Ambulatory Visit: Payer: Self-pay | Admitting: Internal Medicine

## 2023-06-15 DIAGNOSIS — E039 Hypothyroidism, unspecified: Secondary | ICD-10-CM

## 2023-06-16 ENCOUNTER — Other Ambulatory Visit: Payer: Self-pay | Admitting: Internal Medicine

## 2023-06-18 NOTE — Telephone Encounter (Signed)
Requested Prescriptions  Pending Prescriptions Disp Refills   levothyroxine (SYNTHROID) 75 MCG tablet [Pharmacy Med Name: Levothyroxine Sodium 75 MCG Oral Tablet] 52 tablet 0    Sig: TAKE ONE TABLET BY MOUTH ON MONDAYS, WEDNESDAYS, FRIDAYS, AND SUNDAYS     Endocrinology:  Hypothyroid Agents Passed - 06/18/2023 11:17 AM      Passed - TSH in normal range and within 360 days    TSH  Date Value Ref Range Status  12/11/2022 1.18 0.40 - 4.50 mIU/L Final         Passed - Valid encounter within last 12 months    Recent Outpatient Visits           1 month ago Essential hypertension   [facility] Margarita Mail, DO   6 months ago Fatigue, unspecified type   [facility] Mecum, Oswaldo Conroy, PA-C   7 months ago Hypothyroidism, unspecified type   [facility] Margarita Mail, DO   10 months ago Encounter for [employer] annual wellness exam   [facility] Mecum, Oswaldo Conroy, PA-C   1 year ago Hypothyroidism, unspecified type   [facility] Margarita Mail, DO       Future Appointments             In 4 months Margarita Mail, DO [facility], [facility]

## 2023-06-18 NOTE — Telephone Encounter (Signed)
Is is a Estate agent

## 2023-06-22 ENCOUNTER — Ambulatory Visit: Payer: Medicare Other

## 2023-06-22 DIAGNOSIS — I429 Cardiomyopathy, unspecified: Secondary | ICD-10-CM

## 2023-06-23 LAB — CUP PACEART REMOTE DEVICE CHECK
Date Time Interrogation Session: 20241227063928
Implantable Lead Connection Status: 753985
Implantable Lead Connection Status: 753985
Implantable Lead Implant Date: 20230330
Implantable Lead Implant Date: 20230330
Implantable Lead Location: 753859
Implantable Lead Location: 753860
Implantable Lead Model: 377
Implantable Lead Model: 399
Implantable Lead Serial Number: 8000191707
Implantable Lead Serial Number: 8000584009
Implantable Pulse Generator Implant Date: 20230330
Pulse Gen Model: 407145
Pulse Gen Serial Number: 70372674

## 2023-07-11 ENCOUNTER — Ambulatory Visit: Payer: Self-pay | Admitting: *Deleted

## 2023-07-11 NOTE — Patient Outreach (Addendum)
  Care Coordination   07/11/2023 Name: Sydney Mcdaniel MRN: [id number] DOB: 1924/11/01   Care Coordination Outreach Attempts:  An unsuccessful outreach was attempted for an appointment today.  Call attempted to daughter [name] twice, message left.   Follow Up Plan:  Additional outreach attempts will be made to offer the patient complex care management information and services.   Encounter Outcome:  No Answer   Care Coordination Interventions:  No, not indicated    Holland Lundborg, RN, MSN, CCM [place]  [facility], [facility] Health RN Care Coordinator Direct Dial: [phone] / Main [phone] Fax [phone] Email: [email or web address]: [email or web address]

## 2023-07-19 ENCOUNTER — Ambulatory Visit: Payer: Self-pay | Admitting: *Deleted

## 2023-07-19 NOTE — Patient Outreach (Signed)
  Care Coordination   Follow Up Visit Note   07/19/2023 Name: Sydney Mcdaniel MRN: [id number] DOB: 02-23-1925  Sydney Mcdaniel is a 89 y.o. year old female who sees Margarita Mail, DO for primary care. I spoke with Clydie Braun, daughter of  NURA KUWAHARA by phone today.  What matters to the patients health and wellness today?  Per daughter, patient has been doing well, chronic conditions are well managed at this time. Denies any urgent concerns, encouraged to contact this care manager with questions.      Goals Addressed             This Visit's Progress    COMPLETED: RNCM: Effective Management of health and well being   On track    Care Coordination Interventions: Evaluation of current treatment plan related to hyponatremia and hypertension and patient's adherence to plan as established by provider Advised patient to call the office for changes in conditions, new questions or concerns Reviewed medications with patient and discussed adherence and affordability.  Discussed plans with patient for ongoing care management follow up and provided patient with direct contact information for care management team Advised patient to discuss changes in vision with provider            SDOH assessments and interventions completed:  No     Care Coordination Interventions:  Yes, provided   Interventions Today    Flowsheet Row Most Recent Value  Chronic Disease   Chronic disease during today's visit Atrial Fibrillation (AFib), Congestive Heart Failure (CHF)  General Interventions   General Interventions Discussed/Reviewed General Interventions Reviewed, Doctor Visits  Doctor Visits Discussed/Reviewed Doctor Visits Reviewed, Specialist, PCP  Annabell Sabal upcoming: Nephrology 2/17, PCP 5/19]  PCP/Specialist Visits Compliance with follow-up visit  Exercise Interventions   Exercise Discussed/Reviewed Weight Managment  Weight Management Weight maintenance  [Report weight has been stable]   Education Interventions   Education Provided Provided Education  Provided Verbal Education On Medication, When to see the doctor  [Continues to monitor and recored BP trends, denies frequent episodes of dizziness, but recovers well with rest when they do happen]       Follow up plan: Follow up call scheduled for 4/15    Encounter Outcome:  Patient Visit Completed   Rodney Langton, RN, MSN, CCM [place]  [facility], [facility] Health RN Care Coordinator Direct Dial: [phone] / Main [phone] Fax [phone] Email: [email or web address] Website: [email or web address]

## 2023-07-19 NOTE — Patient Instructions (Signed)
Visit Information  Thank you for taking time to visit with me today. Please don't hesitate to contact me if I can be of assistance to you before our next scheduled telephone appointment.  Following are the goals we discussed today:  Continue daily self monitoring (blood pressure and weight). Continue to follow heart failure diet, review attached education.   Our next appointment is by telephone on 4/15  Please call the care guide team at [phone] if you need to cancel or reschedule your appointment.   Please call the Suicide and Crisis Lifeline: 988 call the [place] National Suicide Prevention Lifeline: 913-417-9703 or TTY: (256)040-7849 TTY 878-340-0736) to talk to a trained counselor call 1-800-273-TALK (toll free, 24 hour hotline) call 911 if you are experiencing a Mental Health or Behavioral Health Crisis or need someone to talk to.  Patient verbalizes understanding of instructions and care plan provided today and agrees to view in MyChart. Active MyChart status and patient understanding of how to access instructions and care plan via MyChart confirmed with patient.     The patient has been provided with contact information for the care management team and has been advised to call with any health related questions or concerns.   Rodney Langton, RN, MSN, CCM [facility], [facility] RN Care Coordinator Direct Dial: [phone] / Main [phone] Fax [phone] Email: [email or web address] Website: [email or web address]

## 2023-07-21 ENCOUNTER — Other Ambulatory Visit: Payer: Self-pay | Admitting: Internal Medicine

## 2023-07-21 DIAGNOSIS — E782 Mixed hyperlipidemia: Secondary | ICD-10-CM

## 2023-07-23 NOTE — Telephone Encounter (Signed)
Requested Prescriptions  Pending Prescriptions Disp Refills   atorvastatin (LIPITOR) 20 MG tablet [Pharmacy Med Name: Atorvastatin Calcium 20 MG Oral Tablet] 90 tablet 0    Sig: TAKE 1 TABLET BY MOUTH ONCE DAILY IN THE EVENING     Cardiovascular:  Antilipid - Statins Failed - 07/23/2023  4:10 PM      Failed - Lipid Panel in normal range within the last 12 months    Cholesterol, Total  Date Value Ref Range Status  11/17/2020 171 100 - 199 mg/dL Final   LDL Cholesterol (Calc)  Date Value Ref Range Status  07/02/2018 90 mg/dL (calc) Final    Comment:    Reference range: <100 . Desirable range <100 mg/dL for primary prevention;   <70 mg/dL for patients with CHD or diabetic patients  with > or = 2 CHD risk factors. Marland Kitchen LDL-C is now calculated using the Martin-Hopkins  calculation, which is a validated novel method providing  better accuracy than the Friedewald equation in the  estimation of LDL-C.  Horald Pollen et al. Lenox Ahr. 1610;960(45): 2061-2068  (http://education.[facility].com/faq/FAQ164)    LDL Chol Calc (NIH)  Date Value Ref Range Status  11/17/2020 90 0 - 99 mg/dL Final   HDL  Date Value Ref Range Status  11/17/2020 55 >39 mg/dL Final   Triglycerides  Date Value Ref Range Status  11/17/2020 147 0 - 149 mg/dL Final         Passed - Patient is not pregnant      Passed - Valid encounter within last 12 months    Recent Outpatient Visits           2 months ago Essential hypertension   [facility] Margarita Mail, DO   7 months ago Fatigue, unspecified type   [facility] Mecum, Oswaldo Conroy, PA-C   8 months ago Hypothyroidism, unspecified type   [facility] Margarita Mail, DO   12 months ago Encounter for [employer] annual wellness exam   [facility] Mecum, Oswaldo Conroy, PA-C   1 year ago Hypothyroidism, unspecified type   [facility]  Margarita Mail, DO       Future Appointments             In 3 months Margarita Mail, DO [facility]

## 2023-07-25 ENCOUNTER — Encounter: Payer: Self-pay | Admitting: Internal Medicine

## 2023-07-26 NOTE — Addendum Note (Signed)
Addended by: Elease Etienne A on: 07/26/2023 01:54 PM   Modules accepted: Orders

## 2023-07-26 NOTE — Progress Notes (Signed)
Remote pacemaker transmission.

## 2023-07-30 ENCOUNTER — Encounter: Payer: Self-pay | Admitting: Internal Medicine

## 2023-07-31 ENCOUNTER — Encounter: Payer: Self-pay | Admitting: Internal Medicine

## 2023-07-31 ENCOUNTER — Ambulatory Visit (INDEPENDENT_AMBULATORY_CARE_PROVIDER_SITE_OTHER): Payer: Medicare Other | Admitting: Internal Medicine

## 2023-07-31 ENCOUNTER — Other Ambulatory Visit: Payer: Self-pay

## 2023-07-31 VITALS — BP 120/68 | HR 68 | Temp 97.8°F | Resp 14 | Ht 62.0 in | Wt 123.8 lb

## 2023-07-31 DIAGNOSIS — R531 Weakness: Secondary | ICD-10-CM | POA: Diagnosis not present

## 2023-07-31 LAB — POCT URINALYSIS DIPSTICK
Bilirubin, UA: NEGATIVE
Glucose, UA: NEGATIVE
Ketones, UA: NEGATIVE
Nitrite, UA: NEGATIVE
Protein, UA: POSITIVE — AB
Spec Grav, UA: 1.02 (ref 1.010–1.025)
Urobilinogen, UA: 0.2 U/dL
pH, UA: 6 (ref 5.0–8.0)

## 2023-07-31 NOTE — Patient Instructions (Addendum)
 It was great seeing you today!  Plan discussed at today's visit: -Blood work ordered today, results will be uploaded to MyChart.  -EKG showing atrial fibrillation with several PVCs - please call her Cardiologist to schedule an appointment -Will send urine for culture  Follow up in: already scheduled for May  Take care and let us  know if you have any questions or concerns prior to your next visit.  Dr. Bernardo

## 2023-07-31 NOTE — Progress Notes (Signed)
 Acute Office Visit  Subjective:     Patient ID: Sydney Mcdaniel, female    DOB: [birth year], 89 y.o.   MRN: [id number]  Chief Complaint  Patient presents with   Fatigue    On and off for 2 months    HPI Patient is in today for weakness. She is here with her daughter. Patient has been having episodes of weakness for about 2 months now but becoming more frequent. Will occur in episodes, now happening now multiple times a day. Will wake up in the morning, use her eye medicine and try to make breakfast but will usually have a weakness episode at this time of day. Will sit down and rest and usually feels better after 30 minutes but now happening multiple times a day and taking longer to recover. Denies pain, dizziness, or pre-syncope but does feel short of breath when these episodes occur.   Does have a fib and pacemaker, just had pacemaker checked and was told everything looked good. No recent medication changes. Eating appropriately, normal BM's and denies urinary symptoms.  Review of Systems  Constitutional:  Positive for malaise/fatigue. Negative for chills, fever and weight loss.  Eyes:  Negative for blurred vision.  Respiratory:  Positive for shortness of breath. Negative for cough and wheezing.   Cardiovascular:  Negative for chest pain.  Neurological:  Positive for weakness. Negative for dizziness and headaches.        Objective:    BP 120/68 (Cuff Size: Normal)   Pulse 68   Temp 97.8 F (36.6 C) (Oral)   Resp 14   Ht 5' 2 (1.575 m)   Wt 123 lb 12.8 oz (56.2 kg)   LMP  (LMP Unknown)   SpO2 96%   BMI 22.64 kg/m  BP Readings from Last 3 Encounters:  07/31/23 120/68  05/10/23 (!) 142/62  01/01/23 (!) 140/66   Wt Readings from Last 3 Encounters:  07/31/23 123 lb 12.8 oz (56.2 kg)  05/10/23 116 lb 4.8 oz (52.8 kg)  01/01/23 123 lb 9.6 oz (56.1 kg)      Physical Exam Constitutional:      Appearance: Normal appearance.  HENT:     Head: Normocephalic and  atraumatic.     Mouth/Throat:     Mouth: Mucous membranes are moist.     Pharynx: Oropharynx is clear.  Eyes:     Extraocular Movements: Extraocular movements intact.     Conjunctiva/sclera: Conjunctivae normal.     Pupils: Pupils are equal, round, and reactive to light.  Cardiovascular:     Rate and Rhythm: Normal rate. Rhythm irregular.  Pulmonary:     Effort: Pulmonary effort is normal.     Breath sounds: Normal breath sounds.  Skin:    General: Skin is warm and dry.  Neurological:     General: No focal deficit present.     Mental Status: She is alert. Mental status is at baseline.  Psychiatric:        Mood and Affect: Mood normal.        Behavior: Behavior normal.     No results found for any visits on 07/31/23.      Assessment & Plan:   1. Weakness (Primary): UA with leukocytes, will send for culture. Check labs but EKG showing a fib with multiple PVC's, discussed how this could contribute to her symptoms. Recommend she be evaluated by Cardiology, pacemaker was just evaluated. Follow up scheduled in May, will call if symptoms progress.  - CBC  w/Diff/Platelet - COMPLETE METABOLIC PANEL WITH GFR - TSH - POCT Urinalysis Dipstick - EKG 12-Lead - Urine Culture   Return for already scheduled.  Sharyle Fischer, DO

## 2023-08-01 ENCOUNTER — Ambulatory Visit: Payer: Medicare Other | Attending: Internal Medicine | Admitting: Internal Medicine

## 2023-08-01 ENCOUNTER — Encounter: Payer: Self-pay | Admitting: Internal Medicine

## 2023-08-01 VITALS — BP 150/68 | HR 80 | Ht 62.0 in | Wt 121.4 lb

## 2023-08-01 DIAGNOSIS — I5022 Chronic systolic (congestive) heart failure: Secondary | ICD-10-CM | POA: Diagnosis not present

## 2023-08-01 DIAGNOSIS — I4821 Permanent atrial fibrillation: Secondary | ICD-10-CM | POA: Insufficient documentation

## 2023-08-01 DIAGNOSIS — I5020 Unspecified systolic (congestive) heart failure: Secondary | ICD-10-CM

## 2023-08-01 DIAGNOSIS — R0989 Other specified symptoms and signs involving the circulatory and respiratory systems: Secondary | ICD-10-CM | POA: Insufficient documentation

## 2023-08-01 DIAGNOSIS — I480 Paroxysmal atrial fibrillation: Secondary | ICD-10-CM | POA: Diagnosis not present

## 2023-08-01 LAB — CBC WITH DIFFERENTIAL/PLATELET
Absolute Lymphocytes: 4650 {cells}/uL — ABNORMAL HIGH (ref 850–3900)
Absolute Monocytes: 710 {cells}/uL (ref 200–950)
Basophils Absolute: 9 {cells}/uL (ref 0–200)
Basophils Relative: 0.1 %
Eosinophils Absolute: 82 {cells}/uL (ref 15–500)
Eosinophils Relative: 0.9 %
HCT: 39.7 % (ref 35.0–45.0)
Hemoglobin: 13.1 g/dL (ref 11.7–15.5)
MCH: 31.3 pg (ref 27.0–33.0)
MCHC: 33 g/dL (ref 32.0–36.0)
MCV: 95 fL (ref 80.0–100.0)
MPV: 9.7 fL (ref 7.5–12.5)
Monocytes Relative: 7.8 %
Neutro Abs: 3649 {cells}/uL (ref 1500–7800)
Neutrophils Relative %: 40.1 %
Platelets: 192 10*3/uL (ref 140–400)
RBC: 4.18 10*6/uL (ref 3.80–5.10)
RDW: 12.8 % (ref 11.0–15.0)
Total Lymphocyte: 51.1 %
WBC: 9.1 10*3/uL (ref 3.8–10.8)

## 2023-08-01 LAB — COMPLETE METABOLIC PANEL WITH GFR
AG Ratio: 1.7 (calc) (ref 1.0–2.5)
ALT: 14 U/L (ref 6–29)
AST: 17 U/L (ref 10–35)
Albumin: 4.2 g/dL (ref 3.6–5.1)
Alkaline phosphatase (APISO): 36 U/L — ABNORMAL LOW (ref 37–153)
BUN/Creatinine Ratio: 11 (calc) (ref 6–22)
BUN: 11 mg/dL (ref 7–25)
CO2: 25 mmol/L (ref 20–32)
Calcium: 9.8 mg/dL (ref 8.6–10.4)
Chloride: 101 mmol/L (ref 98–110)
Creat: 1.02 mg/dL — ABNORMAL HIGH (ref 0.60–0.95)
Globulin: 2.5 g/dL (ref 1.9–3.7)
Glucose, Bld: 98 mg/dL (ref 65–99)
Potassium: 4 mmol/L (ref 3.5–5.3)
Sodium: 135 mmol/L (ref 135–146)
Total Bilirubin: 0.4 mg/dL (ref 0.2–1.2)
Total Protein: 6.7 g/dL (ref 6.1–8.1)
eGFR: 50 mL/min/{1.73_m2} — ABNORMAL LOW (ref >=60)

## 2023-08-01 LAB — URINE CULTURE
MICRO NUMBER:: 16040867
SPECIMEN QUALITY:: ADEQUATE

## 2023-08-01 LAB — TSH: TSH: 2.02 m[IU]/L (ref 0.40–4.50)

## 2023-08-01 NOTE — Progress Notes (Signed)
 Cardiology Office Note:  .   Date:  08/02/2023  ID:  Sydney Mcdaniel, DOB Sep 25, 1924, MRN [id number] PCP: Bernardo Fend, DO  [facility] Providers Cardiologist:  Lonni Hanson, MD     History of Present Illness: .   Sydney Mcdaniel is a 89 y.o. female with history of HFrEF, paroxysmal atrial fibrillation, sinus node dysfunction status post pacemaker, carotid atherosclerosis, hypertension, and hyperlipidemia, who is seen for urgent follow-up today due to progressive fatigue and concerns for frequent PVCs on EKG.  Sydney Mcdaniel saw her PCP, Dr. Bernardo, yesterday due to progressive weakness and fatigue over the last few months.  This is most pronounced when she first gets up in the morning and typically improves during the day though recently she has even felt quite sluggish later in the day.  She and her daughter recount sporadic episodes of dyspnea and heavy breathing that can last anywhere from 30 minutes to a few hours.  Sydney Mcdaniel does not associate any obvious palpitations.  She has not had any chest pain.  She continues to monitor her blood pressures closely at home, which have been somewhat labile but are typically in the 130 to 150 mmHg systolic range.  At her last visit with Dr. Fernande, she was instructed to take amlodipine  on a regular basis (had previously been using as needed) and to use lisinopril  for SBP greater than 160.  She does not feel much different with this medicine change.  ROS: See HPI  Studies Reviewed: SABRA   EKG Interpretation Date/Time:  Wednesday August 01 2023 16:37:35 EST Ventricular Rate:  80 PR Interval:    QRS Duration:  134 QT Interval:  432 QTC Calculation: 498 R Axis:   38  Text Interpretation: Atrial fibrillation Non-specific intra-ventricular conduction block Minimal voltage criteria for LVH, may be normal variant ( Cornell product ) Nonspecific T wave abnormality When compared with ECG of 01-Jan-2023 09:59, No significant change was found  Confirmed by Vilas Edgerly [phone]) on 08/01/2023 4:42:43 PM    TTE (03/09/2023):  1. Left ventricular ejection fraction, by estimation, is 40 to 45%. Left  ventricular ejection fraction by 2D MOD biplane is 44.1 %. The left  ventricle has mild to moderately decreased function. The left ventricle  demonstrates global hypokinesis. There  is mild left ventricular hypertrophy. Left ventricular diastolic  parameters are indeterminate.   2. Right ventricular systolic function is normal. The right ventricular  size is normal.   3. The mitral valve is normal in structure. Mild mitral valve  regurgitation.   4. The aortic valve is tricuspid. Aortic valve regurgitation is mild.  Aortic valve sclerosis/calcification is present, without any evidence of  aortic stenosis.   5. The inferior vena cava is normal in size with greater than 50%  respiratory variability, suggesting right atrial pressure of 3 mmHg.   Risk Assessment/Calculations:    CHA2DS2-VASc Score = 6   This indicates a 9.7% annual risk of stroke. The patient's score is based upon: CHF History: 1 HTN History: 1 Diabetes History: 0 Stroke History: 0 Vascular Disease History: 1 Age Score: 2 Gender Score: 1    HYPERTENSION CONTROL Vitals:   08/01/23 1629 08/01/23 1652  BP: (!) 158/68 (!) 150/68    The patient's blood pressure is elevated above target today.  In order to address the patient's elevated BP: Blood pressure will be monitored at home to determine if medication changes need to be made.  Physical Exam:   VS:  BP (!) 150/68 (BP Location: Left Arm, Cuff Size: Normal)   Pulse 80   Ht 5' 2 (1.575 m)   Wt 121 lb 6.4 oz (55.1 kg)   LMP  (LMP Unknown)   SpO2 97%   BMI 22.20 kg/m    Wt Readings from Last 3 Encounters:  08/01/23 121 lb 6.4 oz (55.1 kg)  07/31/23 123 lb 12.8 oz (56.2 kg)  05/10/23 116 lb 4.8 oz (52.8 kg)    General:  NAD.  Accompanied by her daughter. Neck: No JVD or HJR. Lungs: Clear  to auscultation bilaterally without wheezes or crackles. Heart: Irregularly irregular rhythm without murmurs. Abdomen: Soft, nontender, nondistended. Extremities: Trace right and 1+ left ankle edema.  ASSESSMENT AND PLAN: .    Permanent atrial fibrillation and sick sinus syndrome status post pacemaker: Atrial fibrillation appears adequately rate controlled today on EKG and also on her most recent device interrogation.  I have reviewed the EKG done yesterday in Dr. Bernardo office, which appears to show atrial fibrillation with underlying IVCD and other wide-complex beats consistent with demand ventricular pacing.  Review of her most recent device interrogation shows that her ventricular pacing is close to 50%.  I suspect that rate smoothing lead to intermittent pacing during yesterday's EKG.  I do not think this is driving Sydney Mcdaniel's fatigue.  I recommend continuation of anticoagulation with rivaroxaban  and follow-up with Dr. Fernande for management of her atrial fibrillation and sick sinus syndrome status post pacemaker.  Chronic HFrEF: I wonder if progressive symptoms could be due to worsening heart failure.  Sydney Mcdaniel has some mild lower extremity edema that is chronic.  Given her history of falls and orthostatic hypotension, I am reluctant to place her on standing diuretic.  I will repeat an echocardiogram to see if her LVEF has worsened further since 02/2023, when it was in the 40-45% range.  We again discussed role for ischemia evaluation but have agreed to defer this pending repeat echo.  Given her advanced age and comorbidities, I would be nervous about performing cardiac catheterization unless she has significant symptoms that are dramatically affecting her quality of life.  If echo has worsened, we may need to readdress which antihypertensive agent to use on a standing basis, as use of an ACE inhibitor/ARB and beta-blocker would be indicated for goal-directed medical therapy (and not  amlodipine ).  Labile hypertension: Blood pressure mildly to moderately elevated in the office today.  Home readings have been somewhat labile but somewhat similar to today's readings.  In the past, she has not tolerated more aggressive antihypertensive therapy due to significant blood pressure drops and near syncope/syncope.  We will continue with her standing low-dose amlodipine  and as needed lisinopril  for SBP greater than 160 mmHg.    Dispo: Return to clinic in 1 month.  Signed, Lonni Hanson, MD

## 2023-08-01 NOTE — Patient Instructions (Signed)
 Medication Instructions:  Your physician recommends that you continue on your current medications as directed. Please refer to the Current Medication list given to you today.   *If you need a refill on your cardiac medications before your next appointment, please call your pharmacy*   Lab Work: No labs ordered today    Testing/Procedures: Your physician has requested that you have an echocardiogram. Echocardiography is a painless test that uses sound waves to create images of your heart. It provides your doctor with information about the size and shape of your heart and how well your heart's chambers and valves are working.   You may receive an ultrasound enhancing agent through an IV if needed to better visualize your heart during the echo. This procedure takes approximately one hour.  There are no restrictions for this procedure.  This will take place at [address] [facility] [address] [facility] [address], [place] [address]  Please note: We ask at that you not bring children with you during ultrasound (echo/ vascular) testing. Due to room size and safety concerns, children are not allowed in the ultrasound rooms during exams. Our front office staff cannot provide observation of children in our lobby area while testing is being conducted. An adult accompanying a patient to their appointment will only be allowed in the ultrasound room at the discretion of the ultrasound technician under special circumstances. We apologize for any inconvenience.    Follow-Up: At [facility], you and your health needs are our priority.  As part of our continuing mission to provide you with exceptional heart care, we have created designated Provider Care Teams.  These Care Teams include your primary Cardiologist (physician) and Advanced Practice Providers (APPs -  Physician Assistants and Nurse Practitioners) who all work together to provide you with the care you need, when you need it.  We recommend  signing up for the patient portal called "MyChart".  Sign up information is provided on this After Visit Summary.  MyChart is used to connect with patients for Virtual Visits (Telemedicine).  Patients are able to view lab/test results, encounter notes, upcoming appointments, etc.  Non-urgent messages can be sent to your provider as well.   To learn more about what you can do with MyChart, go to [email or web address].    Your next appointment:   1 month(s)  Provider:   You may see [name], MD or one of the following Advanced Practice Providers on your designated Care Team:   Nicolasa Ducking, NP Eula Listen, PA-C Cadence Fransico Michael, PA-C Charlsie Quest, NP Carlos Levering, NP

## 2023-08-02 ENCOUNTER — Encounter: Payer: Self-pay | Admitting: Internal Medicine

## 2023-08-02 DIAGNOSIS — R0989 Other specified symptoms and signs involving the circulatory and respiratory systems: Secondary | ICD-10-CM | POA: Insufficient documentation

## 2023-08-13 DIAGNOSIS — E871 Hypo-osmolality and hyponatremia: Secondary | ICD-10-CM | POA: Diagnosis not present

## 2023-08-13 DIAGNOSIS — I10 Essential (primary) hypertension: Secondary | ICD-10-CM | POA: Diagnosis not present

## 2023-08-13 DIAGNOSIS — N1831 Chronic kidney disease, stage 3a: Secondary | ICD-10-CM | POA: Diagnosis not present

## 2023-08-15 ENCOUNTER — Ambulatory Visit: Payer: Medicare Other | Attending: Internal Medicine

## 2023-08-15 DIAGNOSIS — I5022 Chronic systolic (congestive) heart failure: Secondary | ICD-10-CM | POA: Insufficient documentation

## 2023-08-15 LAB — ECHOCARDIOGRAM COMPLETE
AV Mean grad: 3.3 mm[Hg]
AV Peak grad: 6 mm[Hg]
Ao pk vel: 1.23 m/s
S' Lateral: 2.9 cm

## 2023-08-27 ENCOUNTER — Other Ambulatory Visit: Payer: Self-pay | Admitting: Internal Medicine

## 2023-08-29 ENCOUNTER — Encounter: Payer: Self-pay | Admitting: Cardiology

## 2023-08-29 ENCOUNTER — Ambulatory Visit: Payer: Medicare Other | Attending: Cardiology | Admitting: Cardiology

## 2023-08-29 VITALS — BP 134/50 | HR 73 | Ht 61.0 in | Wt 122.0 lb

## 2023-08-29 DIAGNOSIS — I6523 Occlusion and stenosis of bilateral carotid arteries: Secondary | ICD-10-CM

## 2023-08-29 DIAGNOSIS — R0989 Other specified symptoms and signs involving the circulatory and respiratory systems: Secondary | ICD-10-CM

## 2023-08-29 DIAGNOSIS — I5022 Chronic systolic (congestive) heart failure: Secondary | ICD-10-CM

## 2023-08-29 DIAGNOSIS — Z950 Presence of cardiac pacemaker: Secondary | ICD-10-CM

## 2023-08-29 DIAGNOSIS — I480 Paroxysmal atrial fibrillation: Secondary | ICD-10-CM

## 2023-08-29 DIAGNOSIS — E782 Mixed hyperlipidemia: Secondary | ICD-10-CM | POA: Diagnosis not present

## 2023-08-29 DIAGNOSIS — I495 Sick sinus syndrome: Secondary | ICD-10-CM | POA: Diagnosis not present

## 2023-08-29 NOTE — Patient Instructions (Addendum)
  Medication Instructions:   Your physician recommends that you continue on your current medications as directed. Please refer to the Current Medication list given to you today.  *If you need a refill on your cardiac medications before your next appointment, please call your pharmacy*   Lab Work:  No labs ordered today  If you have labs (blood work) drawn today and your tests are completely normal, you will receive your results only by: MyChart Message (if you have MyChart) OR A paper copy in the mail If you have any lab test that is abnormal or we need to change your treatment, we will call you to review the results.   Testing/Procedures:  No test ordered today    Follow-Up: At [facility], you and your health needs are our priority.  As part of our continuing mission to provide you with exceptional heart care, we have created designated Provider Care Teams.  These Care Teams include your primary Cardiologist (physician) and Advanced Practice Providers (APPs -  Physician Assistants and Nurse Practitioners) who all work together to provide you with the care you need, when you need it.  Your next appointment:   3 month(s)  Provider:   You will see one of the following Advanced Practice Providers on your designated Care Team:   Nicolasa Ducking, NP Eula Listen, PA-C Cadence Fransico Michael, PA-C Charlsie Quest, NP Carlos Levering, NP

## 2023-08-29 NOTE — Progress Notes (Signed)
 Cardiology Office Note:  .   Date:  08/29/2023  ID:  Sydney Mcdaniel, DOB 07-Jul-1924, MRN [id number] PCP: Margarita Mail, DO  [facility] Providers Cardiologist:  [name], MD    History of Present Illness: .   Sydney Mcdaniel is a 89 y.o. female with past medical history of HFmrEF, paroxysmal atrial fibrillation, syncope, carotid atherosclerosis, sick sinus syndrome status post permanent pacemaker implantation (3/23), CLL, hypertension, hyperlipidemia who presents today for follow-up on her paroxysmal atrial fibrillation and chronic HFmrEF.   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   She was last seen in clinic 08/08/2023 by Dr. Okey Dupre.  This is after seeing her PCP the day before due to progressive weakness and fatigue over the last few months.  It is more pronounced when she get up in the morning and simply improves when the day and recently she has been quite sluggish later in the day.  She and her daughter were recount sporadic episodes of dyspnea and heavy breathing and they can last anywhere from 30 minutes to few hours.  She continues to monitor blood pressure closely at home which remain somewhat labile and it typically ranging 130-150.Marland Kitchen  Previous visit with Dr. Graciela Husbands she was instructed to take her amlodipine on a regular basis.  She returns to clinic today accompanied by her daughter.  She continues to have complaints of chest generalized weakness and occasional shortness of breath.  She was seen in clinic last month and had repeat echocardiogram that was done which was unchanged from prior studies.  She states that they have followed up with the each of her providers with exception of her CLL doctor.  According to the patient she has had decreased appetite but her weight has remained stable.  She has also been compliant with her current medication regimen without any adverse effects.  She is continued on rivaroxaban without concerns of bleeding with no blood noted in her stool or urine.  She denies any hospitalizations or visits to the emergency department.   ROS: 10 point review of system has been reviewed and considered negative with exception of what is been listed in HPI  Studies Reviewed: Marland Kitchen   EKG Interpretation Date/Time:  Wednesday August 29 2023 08:57:13 EST Ventricular Rate:  73 PR Interval:    QRS Duration:  128 QT Interval:  432 QTC Calculation: 475 R Axis:   71  Text Interpretation: Atrial fibrillation with occasional ventricular-paced complexes Non-specific intra-ventricular conduction block Minimal voltage criteria for LVH, may be normal variant ( Cornell product ) T wave abnormality, consider inferior ischemia When compared with ECG of 01-Aug-2023 16:37, Electronic ventricular pacemaker has replaced Atrial fibrillation Confirmed by Charlsie Quest ([id number]) on 08/29/2023 10:31:35 AM    2D echo 08/15/2023 1. Left ventricular ejection fraction, by estimation, is 40 to  45%. The  left ventricle has mildly decreased function. The left ventricle  demonstrates regional wall motion abnormalities (hypokinesis of the  anteroseptal wall in setting of bundle branch  block/paced rhythm). The average left ventricular global  longitudinal  strain is -10.2 %. The global longitudinal strain is abnormal.   2. Right ventricular systolic function is normal. The right ventricular  size is normal. There is normal pulmonary artery systolic pressure. The  estimated right ventricular systolic pressure is 29.4 mmHg.   3. The mitral valve is normal in structure. Moderate mitral valve  regurgitation. No evidence of mitral stenosis.   4. Tricuspid valve regurgitation is mild to moderate.   5. The aortic valve is normal in structure. Aortic valve regurgitation is  mild. No aortic stenosis is present.   6. The inferior vena cava is normal in size with greater than 50%  respiratory variability, suggesting right atrial pressure of 3 mmHg.   2D echo 03/09/2023 1. Left ventricular ejection fraction, by estimation, is 40 to 45%. Left  ventricular ejection fraction by 2D MOD biplane is 44.1 %. The left  ventricle has mild to moderately decreased function. The left ventricle  demonstrates global hypokinesis. There  is mild left ventricular hypertrophy. Left ventricular diastolic  parameters are indeterminate.   2. Right ventricular systolic function is normal. The right ventricular  size is normal.   3. The mitral valve is normal in structure. Mild mitral valve  regurgitation.   4. The aortic valve is tricuspid. Aortic valve regurgitation is mild.  Aortic valve sclerosis/calcification is present, without any evidence of  aortic stenosis.   5. The inferior vena cava is normal in size with greater than 50%  respiratory variability, suggesting right atrial pressure of 3 mmHg.   Risk Assessment/Calculations:    CHA2DS2-VASc Score = 6   This indicates a 9.7% annual risk of stroke. The patient's score is based upon: CHF History: 1 HTN History: 1 Diabetes History: 0 Stroke History: 0 Vascular Disease History: 1 Age Score: 2 Gender Score: 1            Physical Exam:   VS:  BP (!) 134/50 (BP Location: Left Arm, Patient  Position: Sitting, Cuff Size: Normal)   Pulse 73   Ht 5\' 1"  (1.549 m)   Wt 122 lb (55.3 kg)   LMP  (LMP Unknown)   SpO2 97%   BMI 23.05 kg/m    Wt Readings from Last 3 Encounters:  08/29/23 122 lb (55.3 kg)  08/01/23 121 lb 6.4 oz (55.1 kg)  07/31/23 123 lb 12.8 oz (56.2 kg)    GEN: Well nourished, well developed in no acute distress NECK: No JVD; No carotid bruits CARDIAC: IR IR, no murmurs, rubs, gallops RESPIRATORY:  Clear to auscultation without rales, wheezing or rhonchi  ABDOMEN: Soft, non-tender, non-distended EXTREMITIES: Trace pretibial edema; No deformity   ASSESSMENT AND PLAN: .   HFmrEF reduced ejection fraction last echocardiogram with LVEF 40-45%.  She denies any shortness of breath or dyspnea on exertion.  Weight has remained stable.  She appears to be euvolemic on exam.  Repeat echocardiogram with no significant changes.  She is continued on spironolactone 25 mg daily, lisinopril 10 mg for blood pressure greater than 160 millimeters of mercury.  She had previously in the past complain of orthostatic symptoms so escalating GDMT is limited.  Labile hypertension with a history of orthostatic lightheadedness and dizziness.  She is continued on amlodipine 2.5 mg daily, lisinopril 10 mg for blood  pressure greater than 160 mmHg, and Aldactone 25 mg daily.  She has been encouraged to continue to monitor pressures 1 to 2 hours postmedication administration as well.  Paroxysmal atrial fibrillation with EKG today revealing rate controlled atrial fibrillation with a rate of 73 and occasional ventricular beat with IVCD as well as LVH.  No acute change from prior studies.  She is continued on rivaroxaban 15 mg daily as she meets reduced dosing criteria for CHA2DS2-VASc score of at least 6 for stroke prophylaxis.  She denies any bleeding or blood noted in her urine or stool.  Recent CBC showed stable hemoglobin.  Looking back looks like she has primarily been in atrial fibrillation which  may be a contributing factor to her generalized weakness and fatigue.  Sick sinus syndrome status post permanent pacemaker which she continues to follow with the EP.  Carotid artery disease with most recent carotid duplex completed in September 2024 revealed minimal stenosis of 1-39% in the bilateral carotids.  Stable findings.  Hyperlipidemia which she is continued on atorvastatin 20 mg daily.  Ongoing management by her PCP.     Dispo: Patient return to clinic to see MD/APP in 3 months or sooner if needed for reevaluation of symptoms.  Signed, Sydney Fellner, NP

## 2023-09-21 ENCOUNTER — Ambulatory Visit (INDEPENDENT_AMBULATORY_CARE_PROVIDER_SITE_OTHER): Payer: Medicare Other

## 2023-09-21 ENCOUNTER — Other Ambulatory Visit: Payer: Self-pay | Admitting: Internal Medicine

## 2023-09-21 DIAGNOSIS — I495 Sick sinus syndrome: Secondary | ICD-10-CM

## 2023-09-21 DIAGNOSIS — E039 Hypothyroidism, unspecified: Secondary | ICD-10-CM

## 2023-09-21 LAB — CUP PACEART REMOTE DEVICE CHECK
Battery Voltage: 85
Date Time Interrogation Session: 20250328081629
Implantable Lead Connection Status: 753985
Implantable Lead Connection Status: 753985
Implantable Lead Implant Date: 20230330
Implantable Lead Implant Date: 20230330
Implantable Lead Location: 753859
Implantable Lead Location: 753860
Implantable Lead Model: 377
Implantable Lead Model: 399
Implantable Lead Serial Number: 8000191707
Implantable Lead Serial Number: 8000584009
Implantable Pulse Generator Implant Date: 20230330
Pulse Gen Model: 407145
Pulse Gen Serial Number: 70372674

## 2023-09-24 NOTE — Telephone Encounter (Signed)
 Requested Prescriptions  Pending Prescriptions Disp Refills   levothyroxine (SYNTHROID) 75 MCG tablet [Pharmacy Med Name: Levothyroxine Sodium 75 MCG Oral Tablet] 52 tablet 0    Sig: TAKE 1 TABLET BY MOUTH ON MONDAYS, WEDNESDAYS, FRIDAYS, AND SUNDAY     Endocrinology:  Hypothyroid Agents Passed - 09/24/2023 12:13 PM      Passed - TSH in normal range and within 360 days    TSH  Date Value Ref Range Status  07/31/2023 2.02 0.40 - 4.50 mIU/L Final         Passed - Valid encounter within last 12 months    Recent Outpatient Visits           1 month ago Weakness   [facility] Margarita Mail, DO       Future Appointments             In 1 month Margarita Mail, DO [facility]   In 2 months Charlsie Quest, NP [facility]

## 2023-09-27 ENCOUNTER — Encounter: Payer: Self-pay | Admitting: Internal Medicine

## 2023-09-27 ENCOUNTER — Ambulatory Visit: Payer: Medicare Other | Attending: Internal Medicine | Admitting: Internal Medicine

## 2023-09-27 VITALS — BP 148/60 | HR 80 | Ht 61.0 in | Wt 128.1 lb

## 2023-09-27 DIAGNOSIS — I5022 Chronic systolic (congestive) heart failure: Secondary | ICD-10-CM | POA: Diagnosis not present

## 2023-09-27 DIAGNOSIS — I480 Paroxysmal atrial fibrillation: Secondary | ICD-10-CM | POA: Diagnosis not present

## 2023-09-27 DIAGNOSIS — I4819 Other persistent atrial fibrillation: Secondary | ICD-10-CM

## 2023-09-27 DIAGNOSIS — I495 Sick sinus syndrome: Secondary | ICD-10-CM | POA: Diagnosis not present

## 2023-09-27 DIAGNOSIS — I447 Left bundle-branch block, unspecified: Secondary | ICD-10-CM

## 2023-09-27 DIAGNOSIS — Z950 Presence of cardiac pacemaker: Secondary | ICD-10-CM | POA: Diagnosis not present

## 2023-09-27 LAB — PACEMAKER DEVICE OBSERVATION

## 2023-09-27 NOTE — Patient Instructions (Signed)
 Medication Instructions:  The current medical regimen is effective;  continue present plan and medications as directed. Please refer to the Current Medication list given to you today.   *If you need a refill on your cardiac medications before your next appointment, please call your pharmacy*  Follow-Up: At [facility], you and your health needs are our priority.  As part of our continuing mission to provide you with exceptional heart care, our providers are all part of one team.  This team includes your primary Cardiologist (physician) and Advanced Practice Providers or APPs (Physician Assistants and Nurse Practitioners) who all work together to provide you with the care you need, when you need it.  Your next appointment:   6 month(s)  Provider:   Nobie Putnam, MD or Sherie Don, NP    We recommend signing up for the patient portal called "MyChart".  Sign up information is provided on this After Visit Summary.  MyChart is used to connect with patients for Virtual Visits (Telemedicine).  Patients are able to view lab/test results, encounter notes, upcoming appointments, etc.  Non-urgent messages can be sent to your provider as well.   To learn more about what you can do with MyChart, go to [email or web address].

## 2023-09-27 NOTE — Progress Notes (Signed)
 Patient Care Team: Margarita Mail, DO as PCP - General (Internal Medicine) End, Cristal Deer, MD as PCP - Cardiology (Cardiology) Isla Pence, OD (Optometry) End, Cristal Deer, MD as Consulting Physician (Cardiology) Mady Haagensen, MD (Nephrology) Creig Hines, MD as Consulting Physician (Oncology) Rodney Langton, RN as [facility] Care Management   HPI  Sydney Mcdaniel is a 89 y.o. 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   Atrial fibrillation paroxysmal anticoagulated with rivaroxaban; no  bleeding  Hypertension and orthostatic hypotension for which she takes midodrine and abdominal binder    Pacing resulted in fewer weak spells and less hypotension  Seeing Dr. Bernita Buffy intercurrently with complaints of worsening dyspnea and fatigue.  Echocardiogram was ordered and was unchanged  Over the last 3 months has had more weak spells, typically occurring in the mornings lasting an hour or so and recurrent an hour or so after breakfast.  In her mind, they are distinct from her prior syncope.  Not so sure if they are distinct from her prior "weak spells "  They are associated with dyspnea but apart from that does not complain of dyspnea     DATE TEST EF    9/20 Echo  60-65%     9/20 Myoview   No ischemia   1/23 Echo   40-45 % Basilar anteroseptal severe HK   2/25 Echo  40-45%      Date Cr K Hgb  2/23 1.01 4.4 11.2 <<8.9  8/23 0.99 5.0 11.1  2/25 1.02 4.0 13.1     Records and Results Reviewed   Past Medical History:  Diagnosis Date   Allergy    Anemia    Cardiomyopathy (HCC)    a. 02/2019 Echo: EF 60-65%, no rwma, doppler parameters consistent w/ pseudonormalization. Nl RV size/fxn; b. 06/2021 Echo: EF 40-45% w/ sev basal-mid antsept HK. GrII DD. Nl RV size/fxn. Mild MR/AI.   Carotid arterial disease (HCC)    a. 04/2020 Carotid U/S: 1-39% bilat ICA stenosis, <50% bilat ECA  stenosis; b. 06/2021 Carotid U/S: RICA 1-39%, LICA 40-59%.   Cataract    CLL (chronic lymphocytic leukemia) (HCC)    GERD (gastroesophageal reflux disease)    Glaucoma    H/O: hysterectomy    Total   History of stress test    a. 02/2019 MV: EF 60%, no ischemia/infarct.   Hyperlipidemia    Hypertension    Hypothyroidism    Impaired fasting glucose    Lichen sclerosus    Osteoporosis    Hips   PAF (paroxysmal atrial fibrillation) (HCC)    a. 06/15/2016 Event monitor: 4% afib burden; b. CHA2DS2VASc - 4-->Xarelto.   Sinus bradycardia    a. avoid AVN blocking agents.   Syncope    a. 03/2016 Echo: EF 55-60%, no rwma, mild AI/MR, nl PASP; b. 03/2016 48h Holter: no significant arrhythmias/pauses; c. 03/2016 MV: mild apical defect, likely breast attenuation, nl EF, low risk; d. 05/2016 Event monitor: No significant arrhythmia; e. 05/2016 Event monitor: PAF (4%).    Past Surgical History:  Procedure Laterality Date   APPENDECTOMY     EYE SURGERY     Glaucoma   MULTIPLE TOOTH EXTRACTIONS N/A    2 teeth   PACEMAKER IMPLANT N/A 09/22/2021   Procedure: PACEMAKER IMPLANT;  Surgeon: Duke Salvia, MD;  [facility] INVASIVE CV LAB;  Service: Cardiovascular;  Laterality: N/A;   TOTAL ABDOMINAL HYSTERECTOMY  Current Meds  Medication Sig   acetaminophen (TYLENOL) 500 MG tablet Take 500 mg by mouth every 8 (eight) hours as needed for moderate pain.   alendronate (FOSAMAX) 70 MG tablet TAKE 1 TABLET BY MOUTH EVERY FRIDAY ON AN EMPTY STOMACH WITH A FULL GLASS OF WATER   amLODipine (NORVASC) 2.5 MG tablet Take 1 tablet by mouth once daily   atorvastatin (LIPITOR) 20 MG tablet TAKE 1 TABLET BY MOUTH ONCE DAILY IN THE EVENING   Calcium Carbonate-Vit D-Min (CALCIUM 1200 PO) Take by mouth. daily   Cholecalciferol 25 MCG (1000 UT) tablet Take 1,000 Units by mouth daily.   dorzolamide (TRUSOPT) 2 % ophthalmic solution Place 1 drop into the right eye 2 (two) times daily.   ferrous sulfate 325 (65  FE) MG tablet Take 2 tablets (650 mg total) by mouth daily with breakfast.   LATANOPROST OP Place 1 drop into the right eye at bedtime.   levothyroxine (SYNTHROID) 75 MCG tablet TAKE 1 TABLET BY MOUTH ON MONDAYS, WEDNESDAYS, FRIDAYS, AND SUNDAY   levothyroxine (SYNTHROID) 88 MCG tablet TAKE 1 TABLET BY MOUTH ON TUESDAYS, THURSDAYS AND SATURDAYS   lisinopril (ZESTRIL) 10 MG tablet Take 1 tablet by mouth daily as needed for systolic BP > than 160.   ondansetron (ZOFRAN) 8 MG tablet Take 8 mg by mouth as needed.   polyethylene glycol (MIRALAX / GLYCOLAX) 17 g packet Take 17 g by mouth daily.   sodium chloride (MURO 128) 5 % ophthalmic solution Place 1 drop into the right eye in the morning, at noon, and at bedtime.   sodium chloride 1 g tablet Take 1 g by mouth. Takes 1/2 tablet with breakfast, 1/2 tablet with lunch, full tablet at supper   spironolactone (ALDACTONE) 25 MG tablet Take 1 tablet by mouth once daily   timolol (TIMOPTIC) 0.5 % ophthalmic solution Place 1 drop into the right eye 2 (two) times daily.   vitamin B-12 (CYANOCOBALAMIN) 100 MCG tablet Take 100 mcg by mouth daily.   XARELTO 15 MG TABS tablet TAKE 1 TABLET EVERY DAY WITH SUPPER    Allergies  Allergen Reactions   Alphagan [Brimonidine] Itching and Rash   Pravachol [Pravastatin Sodium] Itching and Rash      Review of Systems negative except from HPI and PMH  Physical Exam BP (!) 148/60 (BP Location: Left Arm, Patient Position: Sitting, Cuff Size: Normal)   Pulse 80   Ht 5\' 1"  (1.549 m)   Wt 128 lb 2 oz (58.1 kg)   LMP  (LMP Unknown)   SpO2 98%   BMI 24.21 kg/m  Well developed and well nourished in no acute distress HENT normal Neck supple with JVP-flat Clear Device pocket well healed; without hematoma or erythema.  There is no tethering  Regular rate and rhythm, no  gallop No  murmur Abd-soft with active BS No Clubbing cyanosis  edema Skin-warm and dry A & Oriented  Grossly normal sensory and motor  function  ECG Atrial fib 80 -/13/43  Device function is normal. Programming changes   See Paceart for details    CrCl cannot be calculated (Patient's most recent lab result is older than the maximum 21 days allowed.).   Assessment and  Plan  Syncope    Hypertension/orthostatic hypotension  \Weak spells  Atrial fib permanent   Cardiomyopathy-new with distinct wall motion abnormalities    Pacemaker-Biotronik  No interval syncope.  No significant lightheadedness following pacemaker implantation.  Mechanism of her weak spells is unclear.  No  new medications or programming changes undertaken in the last 6 months.  The circumstances of the spells suggest possibly that there is postprandial hypotension.  We will have her check her blood pressure and heart rate following the onset of the spells, as she commonly checks her blood pressure and heart rate prior to eating breakfast.  Given the fact that in her mind these are distinct from her prior syncope, being more aggressive with CLS is not something I am inclined towards especially noting that she already has a fairly distinct peak of ventricular paced beats between 90 and 100 bpm.  With a history of orthostatic hypotension we will maintain her blood pressure it is   Current medicines are reviewed at length with the patient today .  The patient does not  have concerns regarding medicines.

## 2023-10-02 ENCOUNTER — Telehealth: Payer: Self-pay

## 2023-10-02 NOTE — Telephone Encounter (Unsigned)
 Copied from CRM [id number]. Topic: Appointments - Scheduling Inquiry for Clinic >> Oct 02, 2023 11:03 AM Elle L wrote: Reason for CRM: The patient's daughter, Clydie Braun, is requesting to reschedule he care coordination telephone call on 4/15 at 10. Her call back number is [phone].

## 2023-10-04 ENCOUNTER — Telehealth: Payer: Self-pay | Admitting: *Deleted

## 2023-10-04 NOTE — Patient Outreach (Signed)
 Message received from care guide that daughter would like to reschedule outreach scheduled for 4/15 due to scheduling conflict.  Call placed to daughter Clydie Braun, outreach rescheduled for 4/11.   Rodney Langton, RN, MSN, CCM [facility], [facility] RN Care Coordinator Direct Dial: [phone] / Main [phone] Fax [phone] Email: [email or web address]: [email or web address]

## 2023-10-05 ENCOUNTER — Other Ambulatory Visit: Payer: Self-pay | Admitting: *Deleted

## 2023-10-05 NOTE — Patient Instructions (Signed)
 Visit Information  Thank you for taking time to visit with me today. Please don't hesitate to contact me if I can be of assistance to you before our next scheduled appointment.  Patient has met all care management goals. Care Management case will be closed. Patient has been provided contact information should new needs arise.   Please call the care guide team at [phone] if you need to cancel, schedule, or reschedule an appointment.   Please call the Suicide and Crisis Lifeline: 988 call the [place] National Suicide Prevention Lifeline: 639-019-6409 or TTY: 9727055712 TTY (463) 293-6376) to talk to a trained counselor call 1-800-273-TALK (toll free, 24 hour hotline) call 911 if you are experiencing a Mental Health or Behavioral Health Crisis or need someone to talk to.  Rodney Langton, RN, MSN, CCM [facility], [facility] RN Care Coordinator Direct Dial: [phone] / Main [phone] Fax [phone] Email: [email or web address]: [email or web address]

## 2023-10-05 NOTE — Patient Outreach (Signed)
 Complex Care Management   Visit Note  10/05/2023  Name:  Sydney Mcdaniel MRN: [id number] DOB: [birth year]  Situation: Referral received for Complex Care Management related to Heart Failure and Atrial Fibrillation I obtained verbal consent from Caregiver.  Visit completed with daughter  on the phone  Per daughter, patient has been doing well, episodes of weak spells decreased.  She has increased her protein intake, weight, HR, and BP reported stable.  Denies any urgent concerns, encouraged to contact this care manager with questions.     Background:   Past Medical History:  Diagnosis Date   Allergy    Anemia    Cardiomyopathy (HCC)    a. 02/2019 Echo: EF 60-65%, no rwma, doppler parameters consistent w/ pseudonormalization. Nl RV size/fxn; b. 06/2021 Echo: EF 40-45% w/ sev basal-mid antsept HK. GrII DD. Nl RV size/fxn. Mild MR/AI.   Carotid arterial disease (HCC)    a. 04/2020 Carotid U/S: 1-39% bilat ICA stenosis, <50% bilat ECA stenosis; b. 06/2021 Carotid U/S: RICA 1-39%, LICA 40-59%.   Cataract    CLL (chronic lymphocytic leukemia) (HCC)    GERD (gastroesophageal reflux disease)    Glaucoma    H/O: hysterectomy    Total   History of stress test    a. 02/2019 MV: EF 60%, no ischemia/infarct.   Hyperlipidemia    Hypertension    Hypothyroidism    Impaired fasting glucose    Lichen sclerosus    Osteoporosis    Hips   PAF (paroxysmal atrial fibrillation) (HCC)    a. 06/15/2016 Event monitor: 4% afib burden; b. CHA2DS2VASc - 4-->Xarelto.   Sinus bradycardia    a. avoid AVN blocking agents.   Syncope    a. 03/2016 Echo: EF 55-60%, no rwma, mild AI/MR, nl PASP; b. 03/2016 48h Holter: no significant arrhythmias/pauses; c. 03/2016 MV: mild apical defect, likely breast attenuation, nl EF, low risk; d. 05/2016 Event monitor: No significant arrhythmia; e. 05/2016 Event monitor: PAF (4%).    Assessment: Patient Reported Symptoms:  Cognitive Cognitive Status: Alert and oriented to  person, place, and time      Neurological      HEENT HEENT Symptoms Reported: No symptoms reported      Cardiovascular Cardiovascular Symptoms Reported: No symptoms reported    Respiratory Respiratory Symptoms Reported: No symptoms reported    Endocrine Patient reports the following symptoms related to hypoglycemia or hyperglycemia : No symptoms reported    Gastrointestinal Gastrointestinal Symptoms Reported: No symptoms reported      Genitourinary      Integumentary Integumentary Symptoms Reported: No symptoms reported    Musculoskeletal Musculoskelatal Symptoms Reviewed: No symptoms reported        Psychosocial Psychosocial Symptoms Reported: No symptoms reported            07/31/2023    3:22 PM  Depression screen PHQ 2/9  Decreased Interest 0  Down, Depressed, Hopeless 0  PHQ - 2 Score 0    There were no vitals filed for this visit.  Medications Reviewed Today     Reviewed by Rodney Langton, RN (Registered Nurse) on 10/05/23 at 1556  Med List Status: <None>   Medication Order Taking? Sig Documenting Provider Last Dose Status Informant  acetaminophen (TYLENOL) 500 MG tablet [id number] Yes Take 500 mg by mouth every 8 (eight) hours as needed for moderate pain. [provider] Taking Active Family Member  alendronate (FOSAMAX) 70 MG tablet [id number] Yes TAKE 1 TABLET BY MOUTH EVERY FRIDAY ON AN EMPTY STOMACH WITH  A FULL GLASS OF WATER Margarita Mail, DO Taking Active   amLODipine (NORVASC) 2.5 MG tablet [id number] Yes Take 1 tablet by mouth once daily Duke Salvia, MD Taking Active   atorvastatin (LIPITOR) 20 MG tablet [id number] Yes TAKE 1 TABLET BY MOUTH ONCE DAILY IN THE Ralph Leyden, DO Taking Active   Calcium Carbonate-Vit D-Min (CALCIUM 1200 PO) [id number] Yes Take by mouth. daily [provider] Taking Active   Cholecalciferol 25 MCG (1000 UT) tablet [id number] Yes Take 1,000 Units by mouth daily. [provider]  Taking Active   dorzolamide (TRUSOPT) 2 % ophthalmic solution [id number] Yes Place 1 drop into the right eye 2 (two) times daily. [provider] Taking Active   ferrous sulfate 325 (65 FE) MG tablet [id number] Yes Take 2 tablets (650 mg total) by mouth daily with breakfast. Lurene Shadow, MD Taking Active   LATANOPROST OP [id number] Yes Place 1 drop into the right eye at bedtime. [provider] Taking Active   levothyroxine (SYNTHROID) 75 MCG tablet [id number] Yes TAKE 1 TABLET BY MOUTH ON MONDAYS, WEDNESDAYS, FRIDAYS, AND Rhona Leavens, DO Taking Active   levothyroxine (SYNTHROID) 88 MCG tablet [id number] Yes TAKE 1 TABLET BY MOUTH ON TUESDAYS, THURSDAYS AND Geraldine Contras, DO Taking Active   lisinopril (ZESTRIL) 10 MG tablet [id number] Yes Take 1 tablet by mouth daily as needed for systolic BP > than 160. Duke Salvia, MD Taking Active   ondansetron [facility]) 8 MG tablet [id number] Yes Take 8 mg by mouth as needed. [provider] Taking Active   polyethylene glycol (MIRALAX / GLYCOLAX) 17 g packet [id number] Yes Take 17 g by mouth daily. [provider] Taking Active Family Member  sodium chloride (MURO 128) 5 % ophthalmic solution [id number] Yes Place 1 drop into the right eye in the morning, at noon, and at bedtime. [provider] Taking Active   sodium chloride 1 g tablet [id number] Yes Take 1 g by mouth. Takes 1/2 tablet with breakfast, 1/2 tablet with lunch, full tablet at supper [provider] Taking Active   spironolactone (ALDACTONE) 25 MG tablet [id number] Yes Take 1 tablet by mouth once daily End, Cristal Deer, MD Taking Active   timolol (TIMOPTIC) 0.5 % ophthalmic solution [id number] Yes Place 1 drop into the right eye 2 (two) times daily. [provider] Taking Active   vitamin B-12 (CYANOCOBALAMIN) 100 MCG tablet [id number] Yes Take 100 mcg by mouth daily. [provider] Taking Active Family  Member  XARELTO 15 MG TABS tablet [id number] Yes TAKE 1 TABLET EVERY DAY WITH SUPPER End, Cristal Deer, MD Taking Active             Recommendation:   PCP Follow-up AWV on 5/2, PCP on 5/19  Follow Up Plan:   Patient has met all care management goals. Care Management case will be closed. Patient has been provided contact information should new needs arise.   Rodney Langton, RN, MSN, CCM [facility], [facility] RN Care Coordinator Direct Dial: [phone] / Main [phone] Fax [phone] Email: [email or web address]: [email or web address]

## 2023-10-06 ENCOUNTER — Encounter: Payer: Self-pay | Admitting: Internal Medicine

## 2023-10-09 ENCOUNTER — Encounter: Payer: Self-pay | Admitting: *Deleted

## 2023-10-19 ENCOUNTER — Other Ambulatory Visit: Payer: Self-pay | Admitting: Internal Medicine

## 2023-10-19 DIAGNOSIS — E782 Mixed hyperlipidemia: Secondary | ICD-10-CM

## 2023-10-19 NOTE — Telephone Encounter (Signed)
 Requested medication (s) are due for refill today: Yes  Requested medication (s) are on the active medication list: Yes  Last refill:  07/23/23 #90, 0 refills  Future visit scheduled: Yes  Notes to clinic:  Unable to refill per protocol due to failed labs, no updated results.      Requested Prescriptions  Pending Prescriptions Disp Refills   atorvastatin  (LIPITOR) 20 MG tablet [Pharmacy Med Name: Atorvastatin  Calcium  20 MG Oral Tablet] 90 tablet 0    Sig: Take 1 tablet by mouth in the evening     Cardiovascular:  Antilipid - Statins Failed - 10/19/2023  3:20 PM      Failed - Lipid Panel in normal range within the last 12 months    Cholesterol, Total  Date Value Ref Range Status  11/17/2020 171 100 - 199 mg/dL Final   LDL Cholesterol (Calc)  Date Value Ref Range Status  07/02/2018 90 mg/dL (calc) Final    Comment:    Reference range: <100 . Desirable range <100 mg/dL for primary prevention;   <70 mg/dL for patients with CHD or diabetic patients  with > or = 2 CHD risk factors. Aaron Aas LDL-C is now calculated using the Martin-Hopkins  calculation, which is a validated novel method providing  better accuracy than the Friedewald equation in the  estimation of LDL-C.  Melinda Sprawls et al. Erroll Heard. 1324;401(02): 2061-2068  (http://education.[facility].com/faq/FAQ164)    LDL Chol Calc (NIH)  Date Value Ref Range Status  11/17/2020 90 0 - 99 mg/dL Final   HDL  Date Value Ref Range Status  11/17/2020 55 >39 mg/dL Final   Triglycerides  Date Value Ref Range Status  11/17/2020 147 0 - 149 mg/dL Final         Passed - Patient is not pregnant      Passed - Valid encounter within last 12 months    Recent Outpatient Visits           2 months ago Weakness   [facility] Rockney Cid, DO       Future Appointments             In 1 month Rockney Cid, DO [facility]   In 1 month Ronald Cockayne, NP [facility] [place]

## 2023-10-22 DIAGNOSIS — H401132 Primary open-angle glaucoma, bilateral, moderate stage: Secondary | ICD-10-CM | POA: Diagnosis not present

## 2023-10-22 DIAGNOSIS — H18221 Idiopathic corneal edema, right eye: Secondary | ICD-10-CM | POA: Diagnosis not present

## 2023-10-22 DIAGNOSIS — H04123 Dry eye syndrome of bilateral lacrimal glands: Secondary | ICD-10-CM | POA: Diagnosis not present

## 2023-10-24 ENCOUNTER — Encounter: Payer: Self-pay | Admitting: Internal Medicine

## 2023-10-26 ENCOUNTER — Ambulatory Visit

## 2023-10-26 VITALS — BP 151/63 | HR 69 | Ht 61.0 in | Wt 123.0 lb

## 2023-10-26 DIAGNOSIS — Z2821 Immunization not carried out because of patient refusal: Secondary | ICD-10-CM

## 2023-10-26 DIAGNOSIS — Z0000 Encounter for general adult medical examination without abnormal findings: Secondary | ICD-10-CM

## 2023-10-26 NOTE — Progress Notes (Addendum)
 Because this visit was a virtual/telehealth visit,  certain criteria was not obtained, such a blood pressure, CBG if applicable, and timed get up and go. Any medications not marked as "taking" were not mentioned during the medication reconciliation part of the visit. Any vitals not documented were not able to be obtained due to this being a telehealth visit or patient was unable to self-report a recent blood pressure reading due to a lack of equipment at home via telehealth. Vitals that have been documented are verbally provided by the patient.  Subjective:   Sydney Mcdaniel is a 89 y.o. who presents for a Medicare Wellness preventive visit.  Visit Complete: Virtual I connected with  KEYLI SOMER on 10/26/23 by a video and audio enabled telemedicine application and verified that I am speaking with the correct person using two identifiers.  Patient Location: Home  Provider Location: Home Office  I discussed the limitations of evaluation and management by telemedicine. The patient expressed understanding and agreed to proceed.  Vital Signs: Because this visit was a virtual/telehealth visit, some criteria may be missing or patient reported. Any vitals not documented were not able to be obtained and vitals that have been documented are patient reported.  Persons Participating in Visit: Patient.  AWV Questionnaire: Yes: Patient Medicare AWV questionnaire was completed by the patient on 10/26/2023; I have confirmed that all information answered by patient is correct and no changes since this date.  Cardiac Risk Factors include: advanced age (>51men, >63 women);diabetes mellitus;hypertension;Other (see comment), Risk factor comments: a fin     Objective:    Today's Vitals   10/26/23 1142  BP: (!) 151/63  Pulse: 69  Weight: 123 lb (55.8 kg)  Height: 5\' 1"  (1.549 m)   Body mass index is 23.24 kg/m.     10/26/2023   11:41 AM 11/15/2022   11:09 AM 06/04/2022    3:39 PM 01/31/2022    4:28  PM 01/31/2022    4:27 PM 01/31/2022    4:26 PM 01/30/2022    1:24 PM  Advanced Directives  Does Patient Have a Medical Advance Directive? Yes Yes Yes Yes   Yes  Type of Estate agent of [place];Living will Healthcare Power of [place];Living will Healthcare Power of [place];Living will Healthcare Power of [place];Out of facility DNR (pink MOST or yellow form) Healthcare Power of [employer] of facility DNR (pink MOST or yellow form) Healthcare Power of [place];Out of facility DNR (pink MOST or yellow form)  Does patient want to make changes to medical advance directive? No - Patient declined   No - Patient declined     Copy of Healthcare Power of Attorney in Chart? Yes - validated most recent copy scanned in chart (See row information)    Yes - validated most recent copy scanned in chart (See row information)      Current Medications (verified) Outpatient Encounter Medications as of 10/26/2023  Medication Sig   acetaminophen  (TYLENOL ) 500 MG tablet Take 500 mg by mouth every 8 (eight) hours as needed for moderate pain.   alendronate  (FOSAMAX ) 70 MG tablet TAKE 1 TABLET BY MOUTH EVERY FRIDAY ON AN EMPTY STOMACH WITH A FULL GLASS OF WATER   amLODipine  (NORVASC ) 2.5 MG tablet Take 1 tablet by mouth once daily   atorvastatin  (LIPITOR) 20 MG tablet Take 1 tablet by mouth in the evening   Calcium  Carbonate-Vit D-Min (CALCIUM  1200 PO) Take by mouth. daily   Cholecalciferol  25 MCG (1000 UT) tablet Take 1,000  Units by mouth daily.   dorzolamide (TRUSOPT) 2 % ophthalmic solution Place 1 drop into the right eye 2 (two) times daily.   ferrous sulfate  325 (65 FE) MG tablet Take 2 tablets (650 mg total) by mouth daily with breakfast.   LATANOPROST  OP Place 1 drop into the right eye at bedtime.   levothyroxine  (SYNTHROID ) 75 MCG tablet TAKE 1 TABLET BY MOUTH ON MONDAYS, WEDNESDAYS, FRIDAYS, AND SUNDAY   levothyroxine  (SYNTHROID ) 88 MCG tablet TAKE 1 TABLET BY MOUTH ON TUESDAYS, THURSDAYS  AND SATURDAYS   lisinopril  (ZESTRIL ) 10 MG tablet Take 1 tablet by mouth daily as needed for systolic BP > than 160.   ondansetron  (ZOFRAN ) 8 MG tablet Take 8 mg by mouth as needed.   polyethylene glycol (MIRALAX  / GLYCOLAX ) 17 g packet Take 17 g by mouth daily.   sodium chloride  (MURO 128) 5 % ophthalmic solution Place 1 drop into the right eye in the morning, at noon, and at bedtime.   sodium chloride  1 g tablet Take 1 g by mouth. Takes 1/2 tablet with breakfast, 1/2 tablet with lunch, full tablet at supper   spironolactone  (ALDACTONE ) 25 MG tablet Take 1 tablet by mouth once daily   timolol  (TIMOPTIC ) 0.5 % ophthalmic solution Place 1 drop into the right eye 2 (two) times daily.   vitamin B-12 (CYANOCOBALAMIN ) 100 MCG tablet Take 100 mcg by mouth daily.   XARELTO  15 MG TABS tablet TAKE 1 TABLET EVERY DAY WITH SUPPER   No facility-administered encounter medications on file as of 10/26/2023.    Allergies (verified) Alphagan  [brimonidine ] and Pravachol [pravastatin sodium]   History: Past Medical History:  Diagnosis Date   Allergy    Anemia    Cardiomyopathy (HCC)    a. 02/2019 Echo: EF 60-65%, no rwma, doppler parameters consistent w/ pseudonormalization. Nl RV size/fxn; b. 06/2021 Echo: EF 40-45% w/ sev basal-mid antsept HK. GrII DD. Nl RV size/fxn. Mild MR/AI.   Carotid arterial disease (HCC)    a. 04/2020 Carotid U/S: 1-39% bilat ICA stenosis, <50% bilat ECA stenosis; b. 06/2021 Carotid U/S: RICA 1-39%, LICA 40-59%.   Cataract    CLL (chronic lymphocytic leukemia) (HCC)    GERD (gastroesophageal reflux disease)    Glaucoma    H/O: hysterectomy    Total   History of stress test    a. 02/2019 MV: EF 60%, no ischemia/infarct.   Hyperlipidemia    Hypertension    Hypothyroidism    Impaired fasting glucose    Lichen sclerosus    Osteoporosis    Hips   PAF (paroxysmal atrial fibrillation) (HCC)    a. 06/15/2016 Event monitor: 4% afib burden; b. CHA2DS2VASc - 4-->Xarelto .   Sinus  bradycardia    a. avoid AVN blocking agents.   Syncope    a. 03/2016 Echo: EF 55-60%, no rwma, mild AI/MR, nl PASP; b. 03/2016 48h Holter: no significant arrhythmias/pauses; c. 03/2016 MV: mild apical defect, likely breast attenuation, nl EF, low risk; d. 05/2016 Event monitor: No significant arrhythmia; e. 05/2016 Event monitor: PAF (4%).   Past Surgical History:  Procedure Laterality Date   APPENDECTOMY     EYE SURGERY     Glaucoma   MULTIPLE TOOTH EXTRACTIONS N/A    2 teeth   PACEMAKER IMPLANT N/A 09/22/2021   Procedure: PACEMAKER IMPLANT;  Surgeon: Verona Goodwill, MD;  [facility] INVASIVE CV LAB;  Service: Cardiovascular;  Laterality: N/A;   TOTAL ABDOMINAL HYSTERECTOMY     Family History  Problem Relation Age  of Onset   Heart attack Mother    Glaucoma Mother    Heart attack Father    Parkinson's disease Brother    Heart attack Brother    Social History   Socioeconomic History   Marital status: Widowed    Spouse name: Not on file   Number of children: 2   Years of education: Not on file   Highest education level: 12th grade  Occupational History   Occupation: retired  Tobacco Use   Smoking status: Never   Smokeless tobacco: Never  Vaping Use   Vaping status: Never Used  Substance and Sexual Activity   Alcohol use: No   Drug use: No   Sexual activity: Not Currently  Other Topics Concern   Not on file  Social History Narrative   Pt lives alone. Daughter [name] close by   Social Drivers of [employer] Strain: Medium Risk (10/26/2023)   Overall Financial Resource Strain (CARDIA)    Difficulty of Paying Living Expenses: Somewhat hard  Food Insecurity: No Food Insecurity (10/26/2023)   Hunger Vital Sign    Worried About Running Out of Food in the Last Year: Never true    Ran Out of Food in the Last Year: Never true  Transportation Needs: No Transportation Needs (10/26/2023)   PRAPARE - Administrator, Civil Service (Medical): No    Lack  of Transportation (Non-Medical): No  Physical Activity: Unknown (10/26/2023)   Exercise Vital Sign    Days of Exercise per Week: 0 days    Minutes of Exercise per Session: Not on file  Stress: No Stress Concern Present (10/26/2023)   [employer] of Occupational Health - Occupational Stress Questionnaire    Feeling of Stress : Not at all  Social Connections: Moderately Isolated (10/26/2023)   Social Connection and Isolation Panel [NHANES]    Frequency of Communication with Friends and Family: More than three times a week    Frequency of Social Gatherings with Friends and Family: More than three times a week    Attends Religious Services: More than 4 times per year    Active Member of [employer] or Organizations: No    Attends Banker Meetings: Not on file    Marital Status: Widowed    Tobacco Counseling Counseling given: Not Answered    Clinical Intake:  Pre-visit preparation completed: Yes  Pain : No/denies pain     BMI - recorded: 23.24 Nutritional Status: BMI of 19-24  Normal Nutritional Risks: None Diabetes: No  Lab Results  Component Value Date   HGBA1C 6.0 (H) 07/02/2018   HGBA1C 6.3 (H) 12/21/2015   HGBA1C 6.1 (H) 06/22/2015     How often do you need to have someone help you when you read instructions, pamphlets, or other written materials from your doctor or pharmacy?: 1 - Never  Interpreter Needed?: No  Information entered by :: Juliann Ochoa   Activities of Daily Living     10/26/2023   11:20 AM 05/10/2023    9:45 AM  In your present state of health, do you have any difficulty performing the following activities:  Hearing? 0 0  Vision? 1 1  Difficulty concentrating or making decisions? 1 1  Walking or climbing stairs? 1 1  Dressing or bathing? 1 1  Doing errands, shopping? 1 1  Preparing Food and eating ? Y   Using the Toilet? N   In the past six months, have you accidently leaked urine? Fernande Howells  Do you have problems with loss of bowel  control? Y   Managing your Medications? Y   Managing your Finances? Y   Housekeeping or managing your Housekeeping? Y     Patient Care Team: Rockney Cid, DO as PCP - General (Internal Medicine) End, Veryl Gottron, MD as PCP - Cardiology (Cardiology) Julia Oats, OD (Optometry) End, Veryl Gottron, MD as Consulting Physician (Cardiology) Windell Hasty, MD (Nephrology) Avonne Boettcher, MD as Consulting Physician (Oncology)  Indicate any recent Medical Services you may have received from other than [facility] providers in the past year (date may be approximate).     Assessment:   This is a routine wellness examination for [place].  Hearing/Vision screen Hearing Screening - Comments:: No hearing aids Vision Screening - Comments:: Wears glasses   Goals Addressed               This Visit's Progress     Patient Stated (pt-stated)        Patient would like to live        Depression Screen     10/26/2023   11:49 AM 07/31/2023    3:22 PM 05/10/2023    9:44 AM 12/11/2022   10:43 AM 11/07/2022    9:03 AM 07/25/2022   10:18 AM 05/09/2022   10:14 AM  PHQ 2/9 Scores  PHQ - 2 Score 0 0 0 0 0 0 0  PHQ- 9 Score 0  0  0 0 0    Fall Risk     10/26/2023   11:20 AM 07/31/2023    3:22 PM 05/10/2023    9:44 AM 12/11/2022   10:42 AM 11/07/2022    9:03 AM  Fall Risk   Falls in the past year? 1 0 0 1 1  Number falls in past yr: 0 0 0 0 0  Injury with Fall? 1 0 0 1 0  Risk for fall due to : History of fall(s) No Fall Risks;Impaired balance/gait;Impaired mobility;Impaired vision  History of fall(s)   Follow up Falls evaluation completed Falls evaluation completed       MEDICARE RISK AT HOME:  Medicare Risk at Home Any stairs in or around the home?: (Patient-Rptd) Yes If so, are there any without handrails?: (Patient-Rptd) No Home free of loose throw rugs in walkways, pet beds, electrical cords, etc?: (Patient-Rptd) Yes Adequate lighting in your home to reduce risk of falls?:  (Patient-Rptd) Yes Life alert?: (Patient-Rptd) Yes Use of a cane, walker or w/c?: Yes (w/c) Grab bars in the bathroom?: (Patient-Rptd) Yes Shower chair or bench in shower?: (Patient-Rptd) Yes Elevated toilet seat or a handicapped toilet?: (Patient-Rptd) Yes  TIMED UP AND GO:  Was the test performed?  No  Cognitive Function: 6CIT completed        10/26/2023   11:44 AM 07/25/2022   11:01 AM 07/20/2020   10:59 AM 07/08/2019    8:57 AM 07/02/2018   10:13 AM  6CIT Screen  What Year? 4 points 0 points 0 points 0 points 0 points  What month? 0 points 0 points 0 points 0 points 0 points  What time? 0 points 0 points 0 points 0 points 0 points  Count back from 20 0 points 0 points 0 points 0 points 0 points  Months in reverse 2 points 4 points 0 points 0 points 0 points  Repeat phrase 0 points 6 points 4 points 4 points 2 points  Total Score 6 points 10 points 4 points 4 points 2 points  Immunizations Immunization History  Administered Date(s) Administered   Fluad Quad(high Dose 65+) 03/17/2021, 03/27/2022   Influenza, High Dose Seasonal PF 03/22/2016, 03/24/2017, 04/01/2018, 03/18/2019, 03/11/2020, 03/21/2023   Influenza,inj,Quad PF,6+ Mos 04/13/2015   Influenza-Unspecified 04/11/2012, 03/10/2014, 03/24/2017   Moderna Covid-19 Fall Seasonal Vaccine 74yrs & older 03/21/2023   PFIZER(Purple Top)SARS-COV-2 Vaccination 07/25/2019, 08/15/2019, 03/29/2020, 05/31/2021, 05/16/2022   PNEUMOCOCCAL CONJUGATE-20 11/07/2022   Pfizer Covid-19 Vaccine Bivalent Booster 48yrs & up 05/16/2022   Pneumococcal Conjugate-13 12/10/2013   Pneumococcal Polysaccharide-23 04/13/2008   RSV,unspecified 06/21/2022   Td 01/22/2004   Tdap 06/27/2017   Zoster, Live 04/10/2006    Screening Tests Health Maintenance  Topic Date Due   Zoster Vaccines- Shingrix (1 of 2) 06/14/1944   COVID-19 Vaccine (7 - Pfizer risk 2024-25 season) 09/18/2023   INFLUENZA VACCINE  01/25/2024   Medicare Annual Wellness (AWV)   10/25/2024   DTaP/Tdap/Td (3 - Td or Tdap) 06/28/2027   Pneumonia Vaccine 85+ Years old  Completed   DEXA SCAN  Completed   HPV VACCINES  Aged Out   Meningococcal B Vaccine  Aged Out    Health Maintenance  Health Maintenance Due  Topic Date Due   Zoster Vaccines- Shingrix (1 of 2) 06/14/1944   COVID-19 Vaccine (7 - Pfizer risk 2024-25 season) 09/18/2023   Health Maintenance Items Addressed:patient states will bring vaccinations in the office.  Additional Screening:  Vision Screening: Recommended annual ophthalmology exams for early detection of glaucoma and other disorders of the eye.  Dental Screening: Recommended annual dental exams for proper oral hygiene  Community Resource Referral / Chronic Care Management: CRR required this visit?  Yes   CCM required this visit?  No     Plan:     I have personally reviewed and noted the following in the patient's chart:   Medical and social history Use of alcohol, tobacco or illicit drugs  Current medications and supplements including opioid prescriptions. Patient is not currently taking opioid prescriptions. Functional ability and status Nutritional status Physical activity Advanced directives List of other physicians Hospitalizations, surgeries, and ER visits in previous 12 months Vitals Screenings to include cognitive, depression, and falls Referrals and appointments  In addition, I have reviewed and discussed with patient certain preventive protocols, quality metrics, and best practice recommendations. A written personalized care plan for preventive services as well as general preventive health recommendations were provided to patient.     Freeda Jerry, [place]   10/26/2023   After Visit Summary: (Mail) Due to this being a telephonic visit, the after visit summary with patients personalized plan was offered to patient via mail   Notes: Nothing significant to report at this time.

## 2023-10-26 NOTE — Patient Instructions (Signed)
 [name] , Thank you for taking time to come for your Medicare Wellness Visit. I appreciate your ongoing commitment to your health goals. Please review the following plan we discussed and let me know if I can assist you in the future.   Referrals/Orders/Follow-Ups/Clinician Recommendations: follow up in 1 year as scheduled.  This is a list of the screening recommended for you and due dates:  Health Maintenance  Topic Date Due   Zoster (Shingles) Vaccine (1 of 2) 06/14/1944   COVID-19 Vaccine (7 - Pfizer risk 2024-25 season) 09/18/2023   Flu Shot  01/25/2024   Medicare Annual Wellness Visit  10/25/2024   DTaP/Tdap/Td vaccine (3 - Td or Tdap) 06/28/2027   Pneumonia Vaccine  Completed   DEXA scan (bone density measurement)  Completed   HPV Vaccine  Aged Out   Meningitis B Vaccine  Aged Out    Advanced directives: (In Chart) A copy of your advanced directives are scanned into your chart should your provider ever need it.  Next Medicare Annual Wellness Visit scheduled for next year: Yes  Have you seen your provider in the last 6 months (3 months if uncontrolled diabetes)? Yes

## 2023-10-30 NOTE — Progress Notes (Signed)
 Remote pacemaker transmission.

## 2023-11-12 ENCOUNTER — Ambulatory Visit: Payer: Medicare Other | Admitting: Internal Medicine

## 2023-11-15 ENCOUNTER — Other Ambulatory Visit: Payer: Self-pay | Admitting: Internal Medicine

## 2023-11-15 DIAGNOSIS — E782 Mixed hyperlipidemia: Secondary | ICD-10-CM

## 2023-11-16 NOTE — Telephone Encounter (Signed)
 Requested Prescriptions  Pending Prescriptions Disp Refills   atorvastatin  (LIPITOR) 20 MG tablet [Pharmacy Med Name: Atorvastatin  Calcium  20 MG Oral Tablet] 90 tablet 0    Sig: Take 1 tablet by mouth in the evening     Cardiovascular:  Antilipid - Statins Failed - 11/16/2023 12:08 PM      Failed - Lipid Panel in normal range within the last 12 months    Cholesterol, Total  Date Value Ref Range Status  11/17/2020 171 100 - 199 mg/dL Final   LDL Cholesterol (Calc)  Date Value Ref Range Status  07/02/2018 90 mg/dL (calc) Final    Comment:    Reference range: <100 . Desirable range <100 mg/dL for primary prevention;   <70 mg/dL for patients with CHD or diabetic patients  with > or = 2 CHD risk factors. Aaron Aas LDL-C is now calculated using the Martin-Hopkins  calculation, which is a validated novel method providing  better accuracy than the Friedewald equation in the  estimation of LDL-C.  Melinda Sprawls et al. Erroll Heard. 1610;960(45): 2061-2068  (http://education.[employer].com/faq/FAQ164)    LDL Chol Calc (NIH)  Date Value Ref Range Status  11/17/2020 90 0 - 99 mg/dL Final   HDL  Date Value Ref Range Status  11/17/2020 55 >39 mg/dL Final   Triglycerides  Date Value Ref Range Status  11/17/2020 147 0 - 149 mg/dL Final         Passed - Patient is not pregnant      Passed - Valid encounter within last 12 months    Recent Outpatient Visits           3 months ago Weakness   [facility] Rockney Cid, DO       Future Appointments             In 6 days Rockney Cid, DO [facility]   In 1 week Ronald Cockayne, NP [facility]

## 2023-11-21 DIAGNOSIS — H401133 Primary open-angle glaucoma, bilateral, severe stage: Secondary | ICD-10-CM | POA: Diagnosis not present

## 2023-11-21 DIAGNOSIS — H18221 Idiopathic corneal edema, right eye: Secondary | ICD-10-CM | POA: Diagnosis not present

## 2023-11-21 DIAGNOSIS — H18413 Arcus senilis, bilateral: Secondary | ICD-10-CM | POA: Diagnosis not present

## 2023-11-21 DIAGNOSIS — Z961 Presence of intraocular lens: Secondary | ICD-10-CM | POA: Diagnosis not present

## 2023-11-22 ENCOUNTER — Other Ambulatory Visit: Payer: Self-pay

## 2023-11-22 ENCOUNTER — Ambulatory Visit (INDEPENDENT_AMBULATORY_CARE_PROVIDER_SITE_OTHER): Admitting: Internal Medicine

## 2023-11-22 ENCOUNTER — Encounter: Payer: Self-pay | Admitting: Internal Medicine

## 2023-11-22 VITALS — BP 118/70 | HR 84 | Resp 14 | Ht 62.0 in | Wt 130.2 lb

## 2023-11-22 DIAGNOSIS — M810 Age-related osteoporosis without current pathological fracture: Secondary | ICD-10-CM

## 2023-11-22 DIAGNOSIS — E782 Mixed hyperlipidemia: Secondary | ICD-10-CM | POA: Diagnosis not present

## 2023-11-22 DIAGNOSIS — I10 Essential (primary) hypertension: Secondary | ICD-10-CM | POA: Diagnosis not present

## 2023-11-22 DIAGNOSIS — R531 Weakness: Secondary | ICD-10-CM

## 2023-11-22 MED ORDER — AMLODIPINE BESYLATE 2.5 MG PO TABS: 2.5000 mg | ORAL_TABLET | Freq: Every day | ORAL | 1 refills | Status: DC

## 2023-11-22 NOTE — Progress Notes (Signed)
 Established Patient Office Visit  Subjective   Patient ID: Sydney Mcdaniel, female    DOB: 06-25-1925  Age: 89 y.o. MRN: [id number]  Chief Complaint  Patient presents with   Medical Management of Chronic Issues    6 month recheck    HPI Sydney Mcdaniel is here for follow up on chronic medical conditions. She is here with her daughter. Overall she is doing well and has no questions/concerns today.   Discussed the use of AI scribe software for clinical note transcription with the patient, who gave verbal consent to proceed.  History of Present Illness Sydney Mcdaniel is a 89 year old female who presents with episodes of weakness and dizziness.  She experiences episodes of weakness, which have improved with increased nutritional intake and adjustments to her eating schedule. Despite these improvements, she continues to feel like she might fall. Her sodium levels were normal when last checked in February, but she remains concerned and requests re-evaluation. She has a history of cardiovascular issues and is currently taking Lipitor and amlodipine  2.5 mg. She also takes Fosamax  weekly for bone health. Her caregiver notes a weight gain of approximately five pounds. She has a history of left leg swelling, which remains unchanged.   Hypertension/A.Fib/CHF: -Medications: Lisinopril  10 mg PRN (<160/90, only has to take once every few weeks) Amlodipine  2.5 mg, Xarelto  decreased to 15 mg (trying to get Eliquis  at a lower cost but so far unsuccessful), Spirolactone 25 mg.  -Patient is compliant with above medications and reports no side effects at this point. -Checking BP at home (average): 130-145/65-80 -Denies any SOB, CP, vision changes or symptoms of hypotension. Does have some mild chronic BLE edema -Following with Cardiology -Last TTE in 1/23 with EF of 40-45% -ICD implanted on 09/22/21, still following with EP  HLD: -Medications: Lipitor 20 mg -Patient is compliant with above medications  and reports no side effects.  -Last lipid panel: Lipid Panel     Component Value Date/Time   CHOL 171 11/17/2020 0855   TRIG 147 11/17/2020 0855   HDL 55 11/17/2020 0855   CHOLHDL 3.1 11/17/2020 0855   CHOLHDL 3.1 07/30/2019 0925   VLDL 22 07/30/2019 0925   LDLCALC 90 11/17/2020 0855   LDLCALC 90 07/02/2018 1040   LABVLDL 26 11/17/2020 0855   Hypothyroidism: -Medications: Levothyroxine  88 mcg on Tuesdays, Thursdays and Saturdays and 75 mcg on Mondays, Wednesdays, Fridays and Sundays -Patient is compliant with the above medication (s) at the above dose and reports no medication side effects.  -Denies weight changes, cold./heat intolerance, skin changes, anxiety/palpitations  -Last TSH: 2/25 2.02  SIADH: -Following with Nephrology at [facility]  -Last sodium 2/25 135 -Currently on NaCl 1 g daily   Osteoporosis:  -Currently on Fosamax  70 mg weekly for 5 years at this point - patient just picked up refill, so will continue for another 3 months then discontinue -DEXA results from 2/20 showed t score left femur -1.4 and in left forearm -2.8.  -Most recent results 3/24 showing t score left femur -1.9 and left forearm -2.7  Health Maintenance: -Blood work UTD -Vaccines UTD  Review of Systems  Constitutional:  Negative for chills and fever.  Eyes:  Negative for blurred vision.  Respiratory:  Negative for shortness of breath.   Cardiovascular:  Negative for chest pain and palpitations.  Neurological:  Positive for dizziness. Negative for tingling and headaches.      Objective:     BP 118/70 (Cuff Size: Normal)  Pulse 84   Resp 14   Ht 5\' 2"  (1.575 m)   Wt 130 lb 3.2 oz (59.1 kg)   LMP  (LMP Unknown)   SpO2 98%   BMI 23.81 kg/m  BP Readings from Last 3 Encounters:  11/22/23 118/70  10/26/23 (!) 151/63  09/27/23 (!) 148/60   Wt Readings from Last 3 Encounters:  11/22/23 130 lb 3.2 oz (59.1 kg)  10/26/23 123 lb (55.8 kg)  09/27/23 128 lb 2 oz (58.1 kg)     Physical Exam Constitutional:      Appearance: Normal appearance.  HENT:     Head: Normocephalic and atraumatic.     Right Ear: Tympanic membrane, ear canal and external ear normal.     Left Ear: Ear canal and external ear normal. There is impacted cerumen.  Eyes:     Conjunctiva/sclera: Conjunctivae normal.  Cardiovascular:     Rate and Rhythm: Normal rate and regular rhythm.  Pulmonary:     Effort: Pulmonary effort is normal.     Breath sounds: Normal breath sounds.  Musculoskeletal:     Right lower leg: No edema.     Left lower leg: No edema.  Skin:    General: Skin is warm and dry.  Neurological:     General: No focal deficit present.     Mental Status: She is alert. Mental status is at baseline.  Psychiatric:        Mood and Affect: Mood normal.        Behavior: Behavior normal.      No results found for any visits on 11/22/23.  Last CBC Lab Results  Component Value Date   WBC 9.1 07/31/2023   HGB 13.1 07/31/2023   HCT 39.7 07/31/2023   MCV 95.0 07/31/2023   MCH 31.3 07/31/2023   RDW 12.8 07/31/2023   PLT 192 07/31/2023   Last metabolic panel Lab Results  Component Value Date   GLUCOSE 98 07/31/2023   NA 135 07/31/2023   K 4.0 07/31/2023   CL 101 07/31/2023   CO2 25 07/31/2023   BUN 11 07/31/2023   CREATININE 1.02 (H) 07/31/2023   EGFR 50 (L) 07/31/2023   CALCIUM  9.8 07/31/2023   PROT 6.7 07/31/2023   ALBUMIN 3.8 01/31/2022   LABGLOB 2.4 06/22/2015   AGRATIO 1.6 06/22/2015   BILITOT 0.4 07/31/2023   ALKPHOS 26 (L) 01/31/2022   AST 17 07/31/2023   ALT 14 07/31/2023   ANIONGAP 7 09/26/2022   Last lipids Lab Results  Component Value Date   CHOL 171 11/17/2020   HDL 55 11/17/2020   LDLCALC 90 11/17/2020   TRIG 147 11/17/2020   CHOLHDL 3.1 11/17/2020   Last hemoglobin A1c Lab Results  Component Value Date   HGBA1C 6.0 (H) 07/02/2018   Last thyroid  functions Lab Results  Component Value Date   TSH 2.02 07/31/2023   T4TOTAL 9.2  12/11/2022   Last vitamin D  Lab Results  Component Value Date   VD25OH 57 12/11/2022   Last vitamin B12 and Folate Lab Results  Component Value Date   VITAMINB12 480 12/11/2022   FOLATE 22.0 11/16/2021      The ASCVD Risk score (Arnett DK, et al., 2019) failed to calculate for the following reasons:   The 2019 ASCVD risk score is only valid for ages 32 to 39    Assessment & Plan:   Assessment and Plan Assessment & Plan Weakness spells Improved with dietary changes. Occasional dizziness persists, possibly due to low  sodium. Cardiologist suggested meal timing adjustments. - Order sodium level test. - Continue dietary regimen with peanut butter toast and protein shakes.  Hypertension Current medication regimen ongoing. Recent BP 118/70 mmHg, HR 84 bpm. - Refill amlodipine  2.5 mg.  Hyperlipidemia Managed with Lipitor.  Osteoporosis On Fosamax  for five years. Medication vacation recommended. - Continue Fosamax  until current supply is finished, then discontinue.  General Health Maintenance Up to date on RSV and pneumonia vaccines. Discussed future COVID-19 vaccination. - Consider COVID-19 vaccination in the fall.  - Basic Metabolic Panel (BMET) - amLODipine  (NORVASC ) 2.5 MG tablet; Take 1 tablet (2.5 mg total) by mouth daily.  Dispense: 90 tablet; Refill: 1   Return in about 6 months (around 05/24/2024).    Rockney Cid, DO

## 2023-11-23 ENCOUNTER — Ambulatory Visit: Payer: Self-pay | Admitting: Internal Medicine

## 2023-11-23 LAB — BASIC METABOLIC PANEL WITH GFR
BUN: 19 mg/dL (ref 7–25)
CO2: 25 mmol/L (ref 20–32)
Calcium: 9.6 mg/dL (ref 8.6–10.4)
Chloride: 102 mmol/L (ref 98–110)
Creat: 0.77 mg/dL (ref 0.60–0.95)
Glucose, Bld: 99 mg/dL (ref 65–99)
Potassium: 4.3 mmol/L (ref 3.5–5.3)
Sodium: 136 mmol/L (ref 135–146)
eGFR: 71 mL/min/{1.73_m2} (ref >=60)

## 2023-11-29 ENCOUNTER — Ambulatory Visit: Attending: Cardiology | Admitting: Cardiology

## 2023-11-29 ENCOUNTER — Encounter: Payer: Self-pay | Admitting: Cardiology

## 2023-11-29 VITALS — BP 154/68 | HR 82 | Ht 62.0 in | Wt 133.6 lb

## 2023-11-29 DIAGNOSIS — I495 Sick sinus syndrome: Secondary | ICD-10-CM | POA: Diagnosis not present

## 2023-11-29 DIAGNOSIS — E782 Mixed hyperlipidemia: Secondary | ICD-10-CM | POA: Diagnosis not present

## 2023-11-29 DIAGNOSIS — I5022 Chronic systolic (congestive) heart failure: Secondary | ICD-10-CM | POA: Diagnosis not present

## 2023-11-29 DIAGNOSIS — I4821 Permanent atrial fibrillation: Secondary | ICD-10-CM | POA: Insufficient documentation

## 2023-11-29 DIAGNOSIS — R0989 Other specified symptoms and signs involving the circulatory and respiratory systems: Secondary | ICD-10-CM | POA: Insufficient documentation

## 2023-11-29 DIAGNOSIS — Z950 Presence of cardiac pacemaker: Secondary | ICD-10-CM | POA: Diagnosis not present

## 2023-11-29 DIAGNOSIS — I6523 Occlusion and stenosis of bilateral carotid arteries: Secondary | ICD-10-CM | POA: Diagnosis not present

## 2023-11-29 NOTE — Patient Instructions (Signed)
 Medication Instructions:  Your physician recommends that you continue on your current medications as directed. Please refer to the Current Medication list given to you today.   *If you need a refill on your cardiac medications before your next appointment, please call your pharmacy*  Lab Work: No labs ordered today  If you have labs (blood work) drawn today and your tests are completely normal, you will receive your results only by: MyChart Message (if you have MyChart) OR A paper copy in the mail If you have any lab test that is abnormal or we need to change your treatment, we will call you to review the results.  Testing/Procedures: No test ordered today   Follow-Up: At [facility], you and your health needs are our priority.  As part of our continuing mission to provide you with exceptional heart care, our providers are all part of one team.  This team includes your primary Cardiologist (physician) and Advanced Practice Providers or APPs (Physician Assistants and Nurse Practitioners) who all work together to provide you with the care you need, when you need it.  Your next appointment:   4 - 5 month(s)  Provider:   Sammy Crisp, MD or Ronald Cockayne, NP

## 2023-11-29 NOTE — Progress Notes (Signed)
 Cardiology Office Note   Date:  11/29/2023  ID:  Sydney Mcdaniel, DOB [birth year], MRN [id number] PCP: Rockney Cid, DO  [facility] Providers Cardiologist:  Sammy Crisp, MD     History of Present Illness Sydney Mcdaniel is a 89 y.o. female with past medical history of HFmrEF, paroxysmal atrial fibrillation, syncope, carotid atherosclerosis, sick sinus syndrome status post permanent pacemaker implantation (08/2021), CLL, hypertension, hyperlipidemia, who presents today for follow-up on her paroxysmal atrial fibrillation and chronic HFmrEF.   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    She was seen in clinic 08/29/2023 accompanied by her daughter.  She continued complaints of generalized weakness and occasional shortness of breath.  She had seen in clinic last month for repeat echocardiogram that was done which was unchanged from prior studies.  She states they have followed up with each one of her providers with the exception of her CLL doctor.  There were no medication changes that were made and further testing that was ordered at that time.  She returns to clinic today accompanied by her daughter.  She occasionally has some weakness that has improved  with eating a little bit more frequently during the day and noted a weight gain.  She was last seen in clinic by Dr. Rodolfo Clan who advised that she eat a little bit more.  Blood pressure has been better controlled at home.  She has been compliant with her current medication regimen.  She has not missed any doses of her rivaroxaban  and denies any issues with bleeding with no blood noted in her urine or stool.  She also denies any hospitalizations or visits to the emergency department.  ROS: 10 point review of systems has been reviewed and considered negative except ones been listed in the HPI  Studies Reviewed EKG Interpretation Date/Time:  Thursday November 29 2023 14:26:37 EDT Ventricular Rate:  82 PR Interval:    QRS Duration:  134 QT Interval:  416 QTC Calculation: 486 R Axis:   9  Text Interpretation: Atrial fibrillation with occasional ventricular-paced complexes Non-specific intra-ventricular conduction block Minimal voltage criteria for LVH, may be normal variant ( Cornell product ) Nonspecific T wave abnormality When compared with ECG of 27-Sep-2023 14:40, Confirmed by Ronald Cockayne ([id number]) on 11/29/2023 2:29:34 PM    2D echo 08/15/2023 1. Left ventricular ejection fraction, by estimation, is 40 to 45%. The  left ventricle has mildly decreased function. The left ventricle  demonstrates regional wall motion abnormalities (hypokinesis of the  anteroseptal wall in setting of bundle branch  block/paced rhythm). The average left ventricular global longitudinal  strain is -10.2 %. The global longitudinal strain is abnormal.   2. Right ventricular systolic function is normal. The right ventricular  size is normal. There is normal pulmonary artery systolic pressure. The  estimated right  ventricular systolic pressure is 29.4 mmHg.   3. The mitral valve is normal in structure. Moderate mitral valve  regurgitation. No evidence of mitral stenosis.   4. Tricuspid valve regurgitation is mild to moderate.    5. The aortic valve is normal in structure. Aortic valve regurgitation is  mild. No aortic stenosis is present.   6. The inferior vena cava is normal in size with greater than 50%  respiratory variability, suggesting right atrial pressure of 3 mmHg.    2D echo 03/09/2023 1. Left ventricular ejection fraction, by estimation, is 40 to 45%. Left  ventricular ejection fraction by 2D MOD biplane is 44.1 %. The left  ventricle has mild to moderately decreased function. The left ventricle  demonstrates global hypokinesis. There  is mild left ventricular hypertrophy. Left ventricular diastolic  parameters are indeterminate.   2. Right ventricular systolic function is normal. The right ventricular  size is normal.   3. The mitral valve is normal in structure. Mild mitral valve  regurgitation.   4. The aortic valve is tricuspid. Aortic valve regurgitation is mild.  Aortic valve sclerosis/calcification is present, without any evidence of  aortic stenosis.   5. The inferior vena cava is normal in size with greater than 50%  respiratory variability, suggesting right atrial pressure of 3 mmHg.    Risk Assessment/Calculations  CHA2DS2-VASc Score = 6   This indicates a 9.7% annual risk of stroke. The patient's score is based upon: CHF History: 1 HTN History: 1 Diabetes History: 0 Stroke History: 0 Vascular Disease History: 1 Age Score: 2 Gender Score: 1    HYPERTENSION CONTROL Vitals:   11/29/23 0235 11/29/23 1421  BP: 128/72 (!) 154/68    The patient's blood pressure is elevated above target today.  In order to address the patient's elevated BP: Blood pressure will be monitored at home to determine if medication changes need to be made.          Physical Exam VS:  BP (!) 154/68   Pulse 82   Ht 5\' 2"  (1.575 m)   Wt 133 lb 9.6 oz (60.6 kg)   LMP  (LMP Unknown)   SpO2 98%   BMI 24.44 kg/m    Wt Readings from Last 3 Encounters:  11/29/23 133 lb 9.6 oz (60.6 kg)  11/22/23 130  lb 3.2 oz (59.1 kg)  10/26/23 123 lb (55.8 kg)    GEN: Well nourished, well developed in no acute distress NECK: No JVD; No carotid bruits CARDIAC: IR IR, no murmurs, rubs, gallops RESPIRATORY:  Clear to auscultation without rales, wheezing or rhonchi  ABDOMEN: Soft, non-tender, non-distended EXTREMITIES: Trace pretibial edema left greater than right; No deformity   ASSESSMENT AND PLAN HFmrEF with moderately reduced ejection fraction last echocardiogram with an LVEF of 40-45%.  She denies any shortness of breath or dyspnea on exertion.  Weight is up a few pounds with increasing oral intake during the day.  She has been continued on spironolactone  25 mg daily, lisinopril  10 mg as needed for systolic blood pressure greater than 160.  Unable to escalate guideline directed medical therapy due to history of orthostatic and labile blood pressure.  She suffers from NYHA class II symptoms and is primarily euvolemic on exam she does not require diuretic therapy.  Labile hypertension with history of orthostatic hypotension with a blood pressure today of 128/72.  She continues on amlodipine  2.5 mg daily, lisinopril  for systolic blood pressure greater than 160 and spironolactone  25 mg daily.  They  have been encouraged to continue to monitor pressures 1 to 2 hours postmedication administration as well.  Paroxysmal atrial fibrillation rate EKG today reveals rate controlled atrial fibrillation with occasional episodes of being V paced, LVH, IVCD, nonspecific T wave abnormalities with no acute changes.  She has been continued on rivaroxaban  15 mg daily as she meets reduced dosing criteria for CHA2DS2-VASc of at least 6 for stroke prophylaxis.  She does not require beta-blocker therapy.  She has tolerated anticoagulation without concern for bleeding and stable CBCs.  Sick sinus syndrome status post pacemaker which she continues to be followed by EP.  Carotid artery disease with the last carotid duplex completed  September 2024 revealing minimal stenosis of 1-39% the bilateral carotids which are stable findings.  Mixed hyperlipidemia where she had been remaining at goal.  She has been continued on atorvastatin  20 mg daily.        Dispo: Patient return to clinic to see MD/APP in 4 to 5 months or sooner if needed for further evaluation of chronic conditions or symptoms  Signed, Williamson Cavanah, NP

## 2023-12-19 ENCOUNTER — Other Ambulatory Visit: Payer: Self-pay | Admitting: Internal Medicine

## 2023-12-19 DIAGNOSIS — E039 Hypothyroidism, unspecified: Secondary | ICD-10-CM

## 2023-12-20 NOTE — Telephone Encounter (Signed)
 Requested Prescriptions  Pending Prescriptions Disp Refills   levothyroxine  (SYNTHROID ) 75 MCG tablet [Pharmacy Med Name: Levothyroxine  Sodium 75 MCG Oral Tablet] 48 tablet 1    Sig: TAKE 1 TABLET BY MOUTH ON MONDAYS, WEDNESDAYS, FRIDAYS, AND SUNDAYS     Endocrinology:  Hypothyroid Agents Passed - 12/20/2023  2:43 PM      Passed - TSH in normal range and within 360 days    TSH  Date Value Ref Range Status  07/31/2023 2.02 0.40 - 4.50 mIU/L Final         Passed - Valid encounter within last 12 months    Recent Outpatient Visits           4 weeks ago Essential hypertension   [facility] Bernardo Fend, DO   4 months ago Weakness   [facility] Bernardo Fend, DO       Future Appointments             In 3 months Gerard Frederick, NP [facility] [place]   In 4 months Bernardo Fend, DO [facility]

## 2023-12-21 ENCOUNTER — Ambulatory Visit (INDEPENDENT_AMBULATORY_CARE_PROVIDER_SITE_OTHER): Payer: Medicare Other

## 2023-12-21 DIAGNOSIS — I495 Sick sinus syndrome: Secondary | ICD-10-CM | POA: Diagnosis not present

## 2023-12-24 LAB — CUP PACEART REMOTE DEVICE CHECK
Battery Voltage: 80
Date Time Interrogation Session: 20250627084959
Implantable Lead Connection Status: 753985
Implantable Lead Connection Status: 753985
Implantable Lead Implant Date: 20230330
Implantable Lead Implant Date: 20230330
Implantable Lead Location: 753859
Implantable Lead Location: 753860
Implantable Lead Model: 377
Implantable Lead Model: 399
Implantable Lead Serial Number: 8000191707
Implantable Lead Serial Number: 8000584009
Implantable Pulse Generator Implant Date: 20230330
Pulse Gen Model: 407145
Pulse Gen Serial Number: 70372674

## 2023-12-26 ENCOUNTER — Ambulatory Visit: Payer: Self-pay | Admitting: Cardiology

## 2024-01-21 ENCOUNTER — Encounter: Payer: Self-pay | Admitting: Internal Medicine

## 2024-02-11 ENCOUNTER — Encounter: Payer: Self-pay | Admitting: Family Medicine

## 2024-02-11 ENCOUNTER — Ambulatory Visit: Admitting: Family Medicine

## 2024-02-11 VITALS — BP 132/70 | HR 66 | Temp 98.2°F | Resp 16 | Ht 62.0 in | Wt 129.0 lb

## 2024-02-11 DIAGNOSIS — R55 Syncope and collapse: Secondary | ICD-10-CM | POA: Diagnosis not present

## 2024-02-11 DIAGNOSIS — R5383 Other fatigue: Secondary | ICD-10-CM

## 2024-02-11 DIAGNOSIS — R42 Dizziness and giddiness: Secondary | ICD-10-CM

## 2024-02-11 DIAGNOSIS — R7303 Prediabetes: Secondary | ICD-10-CM | POA: Diagnosis not present

## 2024-02-11 DIAGNOSIS — E538 Deficiency of other specified B group vitamins: Secondary | ICD-10-CM

## 2024-02-11 DIAGNOSIS — I480 Paroxysmal atrial fibrillation: Secondary | ICD-10-CM

## 2024-02-11 DIAGNOSIS — R531 Weakness: Secondary | ICD-10-CM | POA: Diagnosis not present

## 2024-02-11 NOTE — Progress Notes (Unsigned)
 Patient ID: Sydney Mcdaniel, female    DOB: [birth year], 89 y.o.   MRN: [id number]  PCP: Bernardo Fend, DO  Chief Complaint  Patient presents with   Fatigue    Weakness and tiredness, x a month or more.   Rash    Red patch on nose, feels scaly. Tried lotion but has not helped.    Subjective:   Sydney Mcdaniel is a 89 y.o. female, presents to clinic with CC of the following:    Continued episodes spells dizziness/near syncope previously worked up extensively by PCP and multiple specialists  Some improvement with episodes with some diet changes  But with continued diet efforts she started to have more spells again Several in the past week She has gained weight, she is living with family now  She has a pacemaker  She checks BP at home, can check sugars and she has not had any low blood sugar episodes   Prediabetes - no labs x 5 years  Lab Results  Component Value Date   HGBA1C 6.0 (H) 07/02/2018    Spot on nose they are concerned about  Discussed the use of AI scribe software for clinical note transcription with the patient, who gave verbal consent to proceed.  History of Present Illness Sydney Mcdaniel is a 89 year old female with atrial fibrillation who presents with episodes of feeling faint.  Presyncope - Episodes of feeling faint occurring more frequently, with two episodes on both Saturday and Sunday - Each episode lasts approximately 45 minutes to 1 hour - During episodes, feels unwell and at risk of passing out - Slight improvement in symptoms after eating  Atrial fibrillation management - Takes Xarelto  15 mg for atrial fibrillation - No recent alerts from cardiac monitoring device  Glucose intolerance - Diagnosed as prediabetic many years ago - No recent hemoglobin A1c monitoring  Cutaneous lesion - Red spot on skin - Applies lotion and Vaseline to affected area   Patient Active Problem List   Diagnosis Date Noted   Labile hypertension  08/02/2023   Persistent atrial fibrillation (HCC) 09/21/2022   Cardiomyopathy (HCC) 09/21/2022   Pacemaker - Biotronik 09/21/2022   HFrEF (heart failure with reduced ejection fraction) (HCC) 05/06/2022   Sick sinus syndrome (HCC) 05/06/2022   General weakness 02/01/2022   Arm pain, anterior, right 01/30/2022   SIADH (syndrome of inappropriate ADH production) (HCC) 07/05/2021   Aspiration pneumonia (HCC) 05/21/2021   Elevated troponin 05/20/2021   Fall 02/11/2021   Normocytic anemia 05/31/2020   Goals of care, counseling/discussion 03/08/2020   Positive direct antiglobulin test (DAT) 12/04/2019   Sinus bradycardia 04/09/2019   First degree AV block 04/09/2019   Osteoporosis 08/21/2018   Bilateral carotid artery stenosis 04/19/2018   History of syncope 10/17/2017   MCI (mild cognitive impairment) 08/12/2017   Lung nodules 07/09/2017   Orthostatic lightheadedness 04/18/2017   Paroxysmal atrial fibrillation (HCC) 04/18/2017   Hyponatremia 10/20/2016   Chronic diastolic congestive heart failure (HCC) 06/28/2016   Syncope, near 05/24/2016   Primary open-angle glaucoma, bilateral, mild stage 05/08/2016   Bladder prolapse, female, acquired 02/25/2016   Asymptomatic bacteriuria 02/25/2016   B12 deficiency 01/11/2016   Anemia 01/10/2016   CLL (chronic lymphocytic leukemia) (HCC) 01/10/2016   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 12/21/2015   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 12/21/2015   Bradycardia 12/21/2015   Lymphocytosis 06/25/2015   Impaired fasting glucose 06/22/2015   Carotid atherosclerosis 06/22/2015  Essential hypertension 12/22/2014   Hyperlipidemia 12/22/2014   Hypothyroidism 12/22/2014      Current Outpatient Medications:    acetaminophen  (TYLENOL ) 500 MG tablet, Take 500 mg by mouth every 8 (eight) hours as needed for moderate pain., Disp: , Rfl:    amLODipine  (NORVASC ) 2.5 MG tablet, Take 1 tablet (2.5 mg total) by mouth daily., Disp: 90  tablet, Rfl: 1   atorvastatin  (LIPITOR) 20 MG tablet, Take 1 tablet by mouth in the evening, Disp: 90 tablet, Rfl: 0   Calcium  Carbonate-Vit D-Min (CALCIUM  1200 PO), Take by mouth. daily, Disp: , Rfl:    Cholecalciferol  25 MCG (1000 UT) tablet, Take 1,000 Units by mouth daily., Disp: , Rfl:    dorzolamide (TRUSOPT) 2 % ophthalmic solution, Place 1 drop into the right eye 2 (two) times daily., Disp: , Rfl:    ferrous sulfate  325 (65 FE) MG tablet, Take 2 tablets (650 mg total) by mouth daily with breakfast., Disp: , Rfl:    LATANOPROST  OP, Place 1 drop into the right eye at bedtime., Disp: , Rfl:    levothyroxine  (SYNTHROID ) 75 MCG tablet, TAKE 1 TABLET BY MOUTH ON MONDAYS, WEDNESDAYS, FRIDAYS, AND SUNDAYS, Disp: 48 tablet, Rfl: 1   levothyroxine  (SYNTHROID ) 88 MCG tablet, TAKE 1 TABLET BY MOUTH ON TUESDAYS, THURSDAYS AND SATURDAYS, Disp: 90 tablet, Rfl: 1   lisinopril  (ZESTRIL ) 10 MG tablet, Take 1 tablet by mouth daily as needed for systolic BP > than 160., Disp: 90 tablet, Rfl: 3   ondansetron  (ZOFRAN ) 8 MG tablet, Take 8 mg by mouth as needed., Disp: , Rfl:    polyethylene glycol (MIRALAX  / GLYCOLAX ) 17 g packet, Take 17 g by mouth daily., Disp: , Rfl:    sodium chloride  (MURO 128) 5 % ophthalmic solution, Place 1 drop into the right eye in the morning, at noon, and at bedtime., Disp: , Rfl:    sodium chloride  1 g tablet, Take 1 g by mouth. Takes 1/2 tablet with breakfast, 1/2 tablet with lunch, full tablet at supper, Disp: , Rfl:    spironolactone  (ALDACTONE ) 25 MG tablet, Take 1 tablet by mouth once daily, Disp: 90 tablet, Rfl: 3   timolol  (TIMOPTIC ) 0.5 % ophthalmic solution, Place 1 drop into the right eye 2 (two) times daily., Disp: , Rfl:    vitamin B-12 (CYANOCOBALAMIN ) 100 MCG tablet, Take 100 mcg by mouth daily., Disp: , Rfl:    XARELTO  15 MG TABS tablet, TAKE 1 TABLET EVERY DAY WITH SUPPER, Disp: 90 tablet, Rfl: 3   Allergies  Allergen Reactions   Alphagan  [Brimonidine ] Itching and  Rash   Pravachol [Pravastatin Sodium] Itching and Rash     Social History   Tobacco Use   Smoking status: Never   Smokeless tobacco: Never  Vaping Use   Vaping status: Never Used  Substance Use Topics   Alcohol use: No   Drug use: No      Chart Review Today: I personally reviewed active problem list, medication list, allergies, family history, social history, health maintenance, notes from last encounter, lab results, imaging with the patient/caregiver today.   Review of Systems  Constitutional: Negative.  Negative for unexpected weight change.  HENT: Negative.    Eyes: Negative.   Respiratory: Negative.    Cardiovascular: Negative.   Gastrointestinal: Negative.   Endocrine: Negative.   Genitourinary: Negative.   Musculoskeletal: Negative.   Skin:  Positive for rash.  Allergic/Immunologic: Negative.   Neurological: Negative.   Hematological: Negative.   Psychiatric/Behavioral: Negative.  All other systems reviewed and are negative.      Objective:   Vitals:   02/11/24 1050  BP: 132/70  Pulse: 66  Resp: 16  Temp: 98.2 F (36.8 C)  SpO2: 99%  Weight: 129 lb (58.5 kg)  Height: 5' 2 (1.575 m)    Body mass index is 23.59 kg/m.  Physical Exam Vitals and nursing note reviewed.  Constitutional:      General: She is not in acute distress.    Appearance: Normal appearance. She is not ill-appearing, toxic-appearing or diaphoretic.  HENT:     Head: Normocephalic and atraumatic.     Right Ear: External ear normal.     Left Ear: External ear normal.     Nose: Nose normal.  Eyes:     General: No scleral icterus.       Right eye: No discharge.        Left eye: No discharge.     Conjunctiva/sclera: Conjunctivae normal.  Cardiovascular:     Rate and Rhythm: Rhythm irregularly irregular.     Pulses: Normal pulses.          Radial pulses are 2+ on the right side and 2+ on the left side.     Heart sounds: Normal heart sounds.  Pulmonary:     Effort:  Pulmonary effort is normal. No respiratory distress.     Breath sounds: Normal breath sounds. No stridor. No wheezing, rhonchi or rales.  Abdominal:     General: Bowel sounds are normal. There is no distension.     Palpations: Abdomen is soft.     Tenderness: There is no abdominal tenderness.  Musculoskeletal:     Right lower leg: No edema.     Left lower leg: No edema.  Skin:    Findings: Lesion (dry skin/patch to left nose) present.  Neurological:     Mental Status: She is alert.     Gait: Gait abnormal (walks with rolling walker).  Psychiatric:        Mood and Affect: Mood normal.        Behavior: Behavior normal.      Results for orders placed or performed in visit on 12/21/23  CUP PACEART REMOTE DEVICE CHECK   Collection Time: 12/21/23  8:49 AM  Result Value Ref Range   Pulse Generator Manufacturer BITR    Date Time Interrogation Session [id number]    Pulse Gen Model [id number] [place] 8 DR-T    Pulse Gen Serial Number [id number]    Clinic Name [facility]    Implantable Pulse Generator Type Implantable Pulse Generator    Implantable Pulse Generator Implant Date 79769669    Implantable Lead Manufacturer BITR    Implantable Lead Model 377 [address] [place] T 53    Implantable Lead Serial Number [id number]    Implantable Lead Implant Date 79769669    Implantable Lead Location Detail 1 UNKNOWN    Implantable Lead Location [id number]    Implantable Lead Connection Status [id number]    Implantable Lead Manufacturer BITR    Implantable Lead Model 399 626 [place] JT 45    Implantable Lead Serial Number [id number]    Implantable Lead Implant Date 79769669    Implantable Lead Location Detail 1 UNKNOWN    Implantable Lead Location [id number]    Implantable Lead Connection Status [id number]    Battery Voltage 80%        Assessment & Plan:   1. Fatigue, unspecified type (Primary) Not new, chronic, will check basic labs and  thyroid  as well as r/o UTI - TSH - CBC with Differential/Platelet - Urine  Culture - Urinalysis, Routine w reflex microscopic - Comprehensive metabolic panel with GFR  2. Weakness See #1 - TSH - CBC with Differential/Platelet - Urine Culture - Urinalysis, Routine w reflex microscopic - Comprehensive metabolic panel with GFR  3. B12 deficiency Last labs in normal range  4. Paroxysmal atrial fibrillation [facility]) Per cardiology, has pacemaker with interogation monthly - no events anticoagulated  5. Syncope, near No syncope, 2 episodes Saturday and 2 Sunday, not new, previously extensively worked up by PCP and multiple specialists Will discuss with PCP when she is in office tomorrow  - TSH - CBC with Differential/Platelet - Urine Culture - Urinalysis, Routine w reflex microscopic - Comprehensive metabolic panel with GFR  6. Dizziness Consider neurology consult - TSH - CBC with Differential/Platelet - Urine Culture - Urinalysis, Routine w reflex microscopic - Comprehensive metabolic panel with GFR  7. Prediabetes Labs not checked since 2020 Will get today They have glucometer at home and sugars have been 120's, no lows or highs - Hemoglobin A1c      Michelene Cower, PA-C 02/11/24 11:18 AM

## 2024-02-12 ENCOUNTER — Ambulatory Visit: Payer: Self-pay | Admitting: Family Medicine

## 2024-02-12 DIAGNOSIS — R531 Weakness: Secondary | ICD-10-CM | POA: Diagnosis not present

## 2024-02-12 DIAGNOSIS — R42 Dizziness and giddiness: Secondary | ICD-10-CM | POA: Diagnosis not present

## 2024-02-12 DIAGNOSIS — R5383 Other fatigue: Secondary | ICD-10-CM | POA: Diagnosis not present

## 2024-02-12 DIAGNOSIS — R7303 Prediabetes: Secondary | ICD-10-CM | POA: Diagnosis not present

## 2024-02-12 DIAGNOSIS — I480 Paroxysmal atrial fibrillation: Secondary | ICD-10-CM | POA: Diagnosis not present

## 2024-02-12 DIAGNOSIS — R55 Syncope and collapse: Secondary | ICD-10-CM | POA: Diagnosis not present

## 2024-02-12 LAB — CBC WITH DIFFERENTIAL/PLATELET
Absolute Lymphocytes: 5278 {cells}/uL — ABNORMAL HIGH (ref 850–3900)
Absolute Monocytes: 866 {cells}/uL (ref 200–950)
Basophils Absolute: 34 {cells}/uL (ref 0–200)
Basophils Relative: 0.3 %
Eosinophils Absolute: 91 {cells}/uL (ref 15–500)
Eosinophils Relative: 0.8 %
HCT: 40.7 % (ref 35.0–45.0)
Hemoglobin: 13.6 g/dL (ref 11.7–15.5)
MCH: 32.2 pg (ref 27.0–33.0)
MCHC: 33.4 g/dL (ref 32.0–36.0)
MCV: 96.2 fL (ref 80.0–100.0)
MPV: 9.5 fL (ref 7.5–12.5)
Monocytes Relative: 7.6 %
Neutro Abs: 5130 {cells}/uL (ref 1500–7800)
Neutrophils Relative %: 45 %
Platelets: 182 Thousand/uL (ref 140–400)
RBC: 4.23 Million/uL (ref 3.80–5.10)
RDW: 12.8 % (ref 11.0–15.0)
Total Lymphocyte: 46.3 %
WBC: 11.4 Thousand/uL — ABNORMAL HIGH (ref 3.8–10.8)

## 2024-02-12 LAB — COMPREHENSIVE METABOLIC PANEL WITH GFR
AG Ratio: 1.4 (calc) (ref 1.0–2.5)
ALT: 18 U/L (ref 6–29)
AST: 19 U/L (ref 10–35)
Albumin: 3.9 g/dL (ref 3.6–5.1)
Alkaline phosphatase (APISO): 31 U/L — ABNORMAL LOW (ref 37–153)
BUN: 16 mg/dL (ref 7–25)
CO2: 26 mmol/L (ref 20–32)
Calcium: 9.7 mg/dL (ref 8.6–10.4)
Chloride: 102 mmol/L (ref 98–110)
Creat: 0.88 mg/dL (ref 0.60–0.95)
Globulin: 2.8 g/dL (ref 1.9–3.7)
Glucose, Bld: 97 mg/dL (ref 65–99)
Potassium: 4.4 mmol/L (ref 3.5–5.3)
Sodium: 139 mmol/L (ref 135–146)
Total Bilirubin: 0.7 mg/dL (ref 0.2–1.2)
Total Protein: 6.7 g/dL (ref 6.1–8.1)
eGFR: 60 mL/min/1.73m2 (ref >=60)

## 2024-02-12 LAB — TSH: TSH: 1.48 m[IU]/L (ref 0.40–4.50)

## 2024-02-12 LAB — HEMOGLOBIN A1C
Hgb A1c MFr Bld: 6.5 % — ABNORMAL HIGH (ref <5.7)
Mean Plasma Glucose: 140 mg/dL
eAG (mmol/L): 7.7 mmol/L

## 2024-02-13 ENCOUNTER — Encounter: Payer: Self-pay | Admitting: Internal Medicine

## 2024-02-13 LAB — URINALYSIS, ROUTINE W REFLEX MICROSCOPIC
Bilirubin Urine: NEGATIVE
Glucose, UA: NEGATIVE
Hgb urine dipstick: NEGATIVE
Hyaline Cast: NONE SEEN /LPF
Ketones, ur: NEGATIVE
Nitrite: NEGATIVE
Protein, ur: NEGATIVE
RBC / HPF: NONE SEEN /HPF (ref 0–2)
Specific Gravity, Urine: 1.011 (ref 1.001–1.035)
Squamous Epithelial / HPF: NONE SEEN /HPF (ref <=5)
pH: 7 (ref 5.0–8.0)

## 2024-02-13 LAB — URINE CULTURE
MICRO NUMBER:: 16852910
SPECIMEN QUALITY:: ADEQUATE

## 2024-02-14 ENCOUNTER — Other Ambulatory Visit: Payer: Self-pay | Admitting: Internal Medicine

## 2024-02-14 DIAGNOSIS — E782 Mixed hyperlipidemia: Secondary | ICD-10-CM

## 2024-02-15 NOTE — Telephone Encounter (Signed)
 Requested medications are due for refill today.  yes  Requested medications are on the active medications list.  yes  Last refill. 11/16/2023 #90 0 rf  Future visit scheduled.   yes  Notes to clinic.  Labs are expired.    Requested Prescriptions  Pending Prescriptions Disp Refills   atorvastatin  (LIPITOR) 20 MG tablet [Pharmacy Med Name: Atorvastatin  Calcium  20 MG Oral Tablet] 90 tablet 0    Sig: Take 1 tablet by mouth in the evening     Cardiovascular:  Antilipid - Statins Failed - 02/15/2024 11:52 AM      Failed - Lipid Panel in normal range within the last 12 months    Cholesterol, Total  Date Value Ref Range Status  11/17/2020 171 100 - 199 mg/dL Final   LDL Cholesterol (Calc)  Date Value Ref Range Status  07/02/2018 90 mg/dL (calc) Final    Comment:    Reference range: <100 . Desirable range <100 mg/dL for primary prevention;   <70 mg/dL for patients with CHD or diabetic patients  with > or = 2 CHD risk factors. SABRA LDL-C is now calculated using the Martin-Hopkins  calculation, which is a validated novel method providing  better accuracy than the Friedewald equation in the  estimation of LDL-C.  Gladis APPLETHWAITE et al. SANDREA. 7986;689(80): 2061-2068  (http://education.[employer].com/faq/FAQ164)    LDL Chol Calc (NIH)  Date Value Ref Range Status  11/17/2020 90 0 - 99 mg/dL Final   HDL  Date Value Ref Range Status  11/17/2020 55 >39 mg/dL Final   Triglycerides  Date Value Ref Range Status  11/17/2020 147 0 - 149 mg/dL Final         Passed - Patient is not pregnant      Passed - Valid encounter within last 12 months    Recent Outpatient Visits           4 days ago Fatigue, unspecified type   [facility] Leavy Mole, PA-C   2 months ago Essential hypertension   [facility] Bernardo Fend, DO   6 months ago Weakness   [facility] Bernardo Fend, DO       Future  Appointments             In 1 month Gerard Frederick, NP [facility] [place]   In 3 months Bernardo Fend, DO [facility]

## 2024-02-15 NOTE — Telephone Encounter (Signed)
 Allergies alert

## 2024-02-22 ENCOUNTER — Other Ambulatory Visit: Payer: Self-pay | Admitting: Internal Medicine

## 2024-02-22 DIAGNOSIS — I480 Paroxysmal atrial fibrillation: Secondary | ICD-10-CM

## 2024-02-28 DIAGNOSIS — I10 Essential (primary) hypertension: Secondary | ICD-10-CM | POA: Diagnosis not present

## 2024-02-28 DIAGNOSIS — E871 Hypo-osmolality and hyponatremia: Secondary | ICD-10-CM | POA: Diagnosis not present

## 2024-02-28 DIAGNOSIS — N182 Chronic kidney disease, stage 2 (mild): Secondary | ICD-10-CM | POA: Diagnosis not present

## 2024-03-10 DIAGNOSIS — H401133 Primary open-angle glaucoma, bilateral, severe stage: Secondary | ICD-10-CM | POA: Diagnosis not present

## 2024-03-10 DIAGNOSIS — H1811 Bullous keratopathy, right eye: Secondary | ICD-10-CM | POA: Diagnosis not present

## 2024-03-10 DIAGNOSIS — H18221 Idiopathic corneal edema, right eye: Secondary | ICD-10-CM | POA: Diagnosis not present

## 2024-03-10 DIAGNOSIS — H18413 Arcus senilis, bilateral: Secondary | ICD-10-CM | POA: Diagnosis not present

## 2024-03-21 ENCOUNTER — Ambulatory Visit: Payer: Medicare Other

## 2024-03-21 DIAGNOSIS — L821 Other seborrheic keratosis: Secondary | ICD-10-CM | POA: Diagnosis not present

## 2024-03-21 DIAGNOSIS — I480 Paroxysmal atrial fibrillation: Secondary | ICD-10-CM

## 2024-03-21 DIAGNOSIS — L570 Actinic keratosis: Secondary | ICD-10-CM | POA: Diagnosis not present

## 2024-03-22 ENCOUNTER — Ambulatory Visit: Payer: Self-pay | Admitting: Cardiology

## 2024-03-22 LAB — CUP PACEART REMOTE DEVICE CHECK
Date Time Interrogation Session: 20250926101755
Implantable Lead Connection Status: 753985
Implantable Lead Connection Status: 753985
Implantable Lead Implant Date: 20230330
Implantable Lead Implant Date: 20230330
Implantable Lead Location: 753859
Implantable Lead Location: 753860
Implantable Lead Model: 377
Implantable Lead Model: 399
Implantable Lead Serial Number: 8000191707
Implantable Lead Serial Number: 8000584009
Implantable Pulse Generator Implant Date: 20230330
Pulse Gen Model: 407145
Pulse Gen Serial Number: 70372674

## 2024-03-24 NOTE — Progress Notes (Signed)
 Remote PPM Transmission

## 2024-03-24 NOTE — Progress Notes (Unsigned)
 Electrophysiology Clinic Note    Date:  03/25/2024  Patient ID:  Sydney, Mcdaniel 1924-12-31, MRN [id number] PCP:  Bernardo Fend, DO  Cardiologist:  Lonni Hanson, MD  Electrophysiologist:  Fonda Kitty, MD  Electrophysiology APP:  Teague Goynes, NP    Discussed the use of AI scribe software for clinical note transcription with the patient, who gave verbal consent to proceed.   Patient Profile    Chief Complaint: PPM follow-up  History of Present Illness: Sydney Mcdaniel is a 89 y.o. female with PMH notable for syncope s/p PPM, perm AFib, HFmrEF, HTN, orthostatic hypotension; seen today for Fonda Kitty, MD (previous Dr. Fernande) for routine electrophysiology followup.   She last saw Dr. Fernande 09/2023 at which time she had more weak spells associated with dyspnea typically occurring before breakfast and then again an hour after breakfast; these are different than her prior syncopal episodes. He asked her to check BP more regularly around the episodes.   On follow-up today, she is doing very well. She has not had any further weak spells since adjusting her diet. Her daughter provides most HPI, states that patient is eating more and has not had any further weak spells. Per daughter, patient piddles around the house going from chair > bed> kitchen several times a day. No dizziness, LH, or SOB with activity.  Patient denies chest pain, chest pressure, palpitations. She has somewhat chronic L ankle edema, no swelling around shins or R ankle/foot. This is not new or worsening.  She continues to take xarelto  daily, no bleeding concerns.     Arrhythmia/Device History Bio dual chamber PPM, imp 08/2021; SND     ROS:  Please see the history of present illness. All other systems are reviewed and otherwise negative.    Physical Exam    VS:  BP 112/68 (BP Location: Left Arm, Patient Position: Sitting, Cuff Size: Normal)   Pulse 86   Ht 5' 2 (1.575 m)   Wt 134 lb  (60.8 kg)   LMP  (LMP Unknown)   SpO2 97%   BMI 24.51 kg/m  BMI: Body mass index is 24.51 kg/m.           Wt Readings from Last 3 Encounters:  03/25/24 134 lb (60.8 kg)  02/11/24 129 lb (58.5 kg)  11/29/23 133 lb 9.6 oz (60.6 kg)     GEN- The patient is well appearing, alert and oriented x 3 today.   Lungs- Clear to ausculation bilaterally, normal work of breathing.  Heart- Irregularly irregular rate and rhythm, no murmurs, rubs or gallops Extremities- Trace peripheral edema at L ankle, warm, dry Skin-  device pocket well-healed, no tethering   Device interrogation done today and reviewed by myself:  Battery 6+ years Lead thresholds, impedence, sensing stable Presents in AFib, perm by device VP 38% Adjusted mode from DDD-CLS > VVI-CLS HR histograms shifted left, adjusted CLS algorithm   Studies Reviewed   Previous EP, cardiology notes.    EKG is ordered. Personal review of EKG from today shows:    EKG Interpretation Date/Time:  Tuesday March 25 2024 10:49:47 EDT Ventricular Rate:  86 PR Interval:    QRS Duration:  140 QT Interval:  448 QTC Calculation: 536 R Axis:   65  Text Interpretation: Atrial fibrillation with occasional ventricular-paced complexes Non-specific intra-ventricular conduction block Confirmed by Lelar Farewell [phone]) on 03/25/2024 10:55:49 AM    TTE, 08/15/2023  1. Left ventricular ejection fraction, by estimation, is 40 to  45%. The left ventricle has mildly decreased function. The left ventricle demonstrates regional wall motion abnormalities (hypokinesis of the anteroseptal wall in setting of bundle branch  block/paced rhythm). The average left ventricular global longitudinal strain is -10.2 %. The global longitudinal strain is abnormal.   2. Right ventricular systolic function is normal. The right ventricular size is normal. There is normal pulmonary artery systolic pressure. The estimated right ventricular systolic pressure is 29.4 mmHg.    3. The mitral valve is normal in structure. Moderate mitral valve regurgitation. No evidence of mitral stenosis.   4. Tricuspid valve regurgitation is mild to moderate.   5. The aortic valve is normal in structure. Aortic valve regurgitation is mild. No aortic stenosis is present.   6. The inferior vena cava is normal in size with greater than 50% respiratory variability, suggesting right atrial pressure of 3 mmHg.   TTE, 03/09/2023 1. Left ventricular ejection fraction, by estimation, is 40 to 45%. Left ventricular ejection fraction by 2D MOD biplane is 44.1 %. The left ventricle has mild to moderately decreased function. The left ventricle demonstrates global hypokinesis. There is mild left ventricular hypertrophy. Left ventricular diastolic parameters are indeterminate.   2. Right ventricular systolic function is normal. The right ventricular size is normal.   3. The mitral valve is normal in structure. Mild mitral valve regurgitation.   4. The aortic valve is tricuspid. Aortic valve regurgitation is mild. Aortic valve sclerosis/calcification is present, without any evidence of aortic stenosis.   5. The inferior vena cava is normal in size with greater than 50% respiratory variability, suggesting right atrial pressure of 3 mmHg.    Assessment and Plan     #) perm afib #) SND s/p PPM Asymptomatic of AFib HR histograms elevated with minimal activity around the house Adjusted PPM mode to VVI-CLS, and adjusted CLS to be less aggressive Continue xarelto  15mg  daily  #) HFmrEF Appears euvolemic on exam with only slight L ankle swelling Activity tolerance stable  #) HTN #) orthostatic hypotension No orthostatic s/s  BP well-controlled today in clinic      Current medicines are reviewed at length with the patient today.   The patient does not have concerns regarding her medicines.  The following changes were made today:  none  Labs/ tests ordered today include:  Orders Placed This  Encounter  Procedures   EKG 12-Lead     Disposition: Follow up with Dr. Kennyth or EP APP in 12 months   Signed, Ibraheem Voris, NP  03/25/24  2:33 PM  Electrophysiology [facility]

## 2024-03-25 ENCOUNTER — Ambulatory Visit: Attending: Cardiology | Admitting: Cardiology

## 2024-03-25 VITALS — BP 112/68 | HR 86 | Ht 62.0 in | Wt 134.0 lb

## 2024-03-25 DIAGNOSIS — Z950 Presence of cardiac pacemaker: Secondary | ICD-10-CM | POA: Insufficient documentation

## 2024-03-25 DIAGNOSIS — I495 Sick sinus syndrome: Secondary | ICD-10-CM | POA: Diagnosis not present

## 2024-03-25 DIAGNOSIS — I4821 Permanent atrial fibrillation: Secondary | ICD-10-CM | POA: Insufficient documentation

## 2024-03-27 ENCOUNTER — Ambulatory Visit: Admitting: Cardiology

## 2024-03-28 DIAGNOSIS — Z23 Encounter for immunization: Secondary | ICD-10-CM | POA: Diagnosis not present

## 2024-04-23 NOTE — Progress Notes (Signed)
 Remote pacemaker transmission.

## 2024-04-23 NOTE — Addendum Note (Signed)
 Addended by: VICCI SELLER A on: 04/23/2024 08:32 AM   Modules accepted: Orders

## 2024-05-15 ENCOUNTER — Encounter: Payer: Self-pay | Admitting: Internal Medicine

## 2024-05-15 ENCOUNTER — Ambulatory Visit: Admitting: Internal Medicine

## 2024-05-15 ENCOUNTER — Other Ambulatory Visit: Payer: Self-pay

## 2024-05-15 VITALS — BP 120/70 | HR 83 | Temp 97.8°F | Resp 14 | Ht 62.0 in | Wt 133.8 lb

## 2024-05-15 DIAGNOSIS — I10 Essential (primary) hypertension: Secondary | ICD-10-CM | POA: Diagnosis not present

## 2024-05-15 DIAGNOSIS — R5382 Chronic fatigue, unspecified: Secondary | ICD-10-CM

## 2024-05-15 DIAGNOSIS — R531 Weakness: Secondary | ICD-10-CM

## 2024-05-15 DIAGNOSIS — C9110 Chronic lymphocytic leukemia of B-cell type not having achieved remission: Secondary | ICD-10-CM | POA: Diagnosis not present

## 2024-05-15 DIAGNOSIS — E559 Vitamin D deficiency, unspecified: Secondary | ICD-10-CM

## 2024-05-15 DIAGNOSIS — E538 Deficiency of other specified B group vitamins: Secondary | ICD-10-CM

## 2024-05-15 MED ORDER — AMLODIPINE BESYLATE 2.5 MG PO TABS: 2.5000 mg | ORAL_TABLET | Freq: Every day | ORAL | 1 refills | Status: AC

## 2024-05-15 NOTE — Progress Notes (Signed)
 Established Patient Office Visit  Subjective   Patient ID: Sydney Mcdaniel, female    DOB: Apr 12, 1925  Age: 89 y.o. MRN: [id number]  Chief Complaint  Patient presents with   Medical Management of Chronic Issues    6 month recheck    HPI Sydney Mcdaniel is here for follow up on chronic medical conditions. She is here with her daughter.   Discussed the use of AI scribe software for clinical note transcription with the patient, who gave verbal consent to proceed.  History of Present Illness Sydney Mcdaniel is a 89 year old female with chronic lymphocytic leukemia who presents with generalized weakness.  She experiences generalized weakness with fatigue and increased daytime napping. She maintains mobility but feels the need to rest frequently. Her daily routine includes regular meals and scheduled rest periods. She has recently moved in with her family, which is a significant change.  Her appetite remains good, with a weight increase from 129 to 133 pounds since August. Previous weight loss concerns have resolved.  There is no pain, discomfort, recent infections, or significant changes in health status. A slight increase in white blood cell count is noted, but no anemia. Her A1c has increased slightly, with normal kidney, electrolyte, liver function, and thyroid  levels previously.  She is currently taking amlodipine , managed by her caregiver, and is due for a refill.   Hypertension/A.Fib/CHF: -Medications: Lisinopril  10 mg PRN (<160/90, only has to take once every few weeks) Amlodipine  2.5 mg, Xarelto  15 mg (trying to get Eliquis  at a lower cost but so far unsuccessful), Spirolactone 25 mg.  -Patient is compliant with above medications and reports no side effects at this point. -Checking BP at home (average): 130-145/65-80 -Denies any SOB, CP, vision changes or symptoms of hypotension. Does have some mild chronic BLE edema -Following with Cardiology -Last TTE in 1/23 with EF of  40-45% -ICD implanted on 09/22/21, still following with EP  HLD: -Medications: Lipitor 20 mg -Patient is compliant with above medications and reports no side effects.  -Last lipid panel: Lipid Panel     Component Value Date/Time   CHOL 171 11/17/2020 0855   TRIG 147 11/17/2020 0855   HDL 55 11/17/2020 0855   CHOLHDL 3.1 11/17/2020 0855   CHOLHDL 3.1 07/30/2019 0925   VLDL 22 07/30/2019 0925   LDLCALC 90 11/17/2020 0855   LDLCALC 90 07/02/2018 1040   LABVLDL 26 11/17/2020 0855   Hypothyroidism: -Medications: Levothyroxine  88 mcg on Tuesdays, Thursdays and Saturdays and 75 mcg on Mondays, Wednesdays, Fridays and Sundays -Patient is compliant with the above medication (s) at the above dose and reports no medication side effects.  -Denies weight changes, cold./heat intolerance, skin changes, anxiety/palpitations  -Last TSH: 1.48 8/25  SIADH: -Following with Nephrology at [facility]  -Last sodium 2/25 135 -Currently on NaCl 1 g daily   Osteoporosis:  -Currently on Fosamax  70 mg weekly for 5 years at this point - patient just picked up refill, so will continue for another 3 months then discontinue -DEXA results from 2/20 showed t score left femur -1.4 and in left forearm -2.8.  -Most recent results 3/24 showing t score left femur -1.9 and left forearm -2.7  Health Maintenance: -Blood work UTD -Vaccines UTD  Review of Systems  Constitutional:  Positive for malaise/fatigue.  Neurological:  Positive for weakness.      Objective:     BP 120/70 (Cuff Size: Normal)   Pulse 83   Temp 97.8 F (36.6 C) (  Oral)   Resp 14   Ht 5' 2 (1.575 m)   Wt 133 lb 12.8 oz (60.7 kg)   LMP  (LMP Unknown)   SpO2 99%   BMI 24.47 kg/m  BP Readings from Last 3 Encounters:  05/15/24 120/70  03/25/24 112/68  02/11/24 132/70   Wt Readings from Last 3 Encounters:  05/15/24 133 lb 12.8 oz (60.7 kg)  03/25/24 134 lb (60.8 kg)  02/11/24 129 lb (58.5 kg)    Physical  Exam Constitutional:      Appearance: Normal appearance.  HENT:     Head: Normocephalic and atraumatic.  Eyes:     Conjunctiva/sclera: Conjunctivae normal.  Cardiovascular:     Rate and Rhythm: Normal rate and regular rhythm.  Pulmonary:     Effort: Pulmonary effort is normal.     Breath sounds: Normal breath sounds.  Skin:    General: Skin is warm and dry.  Neurological:     General: No focal deficit present.     Mental Status: She is alert. Mental status is at baseline.  Psychiatric:        Mood and Affect: Mood normal.        Behavior: Behavior normal.      No results found for any visits on 05/15/24.  Last CBC Lab Results  Component Value Date   WBC 11.4 (H) 02/11/2024   HGB 13.6 02/11/2024   HCT 40.7 02/11/2024   MCV 96.2 02/11/2024   MCH 32.2 02/11/2024   RDW 12.8 02/11/2024   PLT 182 02/11/2024   Last metabolic panel Lab Results  Component Value Date   GLUCOSE 97 02/11/2024   NA 139 02/11/2024   K 4.4 02/11/2024   CL 102 02/11/2024   CO2 26 02/11/2024   BUN 16 02/11/2024   CREATININE 0.88 02/11/2024   EGFR 60 02/11/2024   CALCIUM  9.7 02/11/2024   PROT 6.7 02/11/2024   ALBUMIN 3.8 01/31/2022   LABGLOB 2.4 06/22/2015   AGRATIO 1.6 06/22/2015   BILITOT 0.7 02/11/2024   ALKPHOS 26 (L) 01/31/2022   AST 19 02/11/2024   ALT 18 02/11/2024   ANIONGAP 7 09/26/2022   Last lipids Lab Results  Component Value Date   CHOL 171 11/17/2020   HDL 55 11/17/2020   LDLCALC 90 11/17/2020   TRIG 147 11/17/2020   CHOLHDL 3.1 11/17/2020   Last hemoglobin A1c Lab Results  Component Value Date   HGBA1C 6.5 (H) 02/11/2024   Last thyroid  functions Lab Results  Component Value Date   TSH 1.48 02/11/2024   T4TOTAL 9.2 12/11/2022   Last vitamin D  Lab Results  Component Value Date   VD25OH 57 12/11/2022   Last vitamin B12 and Folate Lab Results  Component Value Date   VITAMINB12 480 12/11/2022   FOLATE 22.0 11/16/2021      The ASCVD Risk score (Arnett  DK, et al., 2019) failed to calculate for the following reasons:   The 2019 ASCVD risk score is only valid for ages 68 to 24    Assessment & Plan:   Assessment & Plan  Weakness and chronic fatigue Chronic fatigue and weakness persist without significant change. Vitals stable, appetite and weight gain good. Labs normal except slightly elevated A1c. CLL well-managed. Recent living situation change may contribute to fatigue. - Ordered blood work for vitamin deficiencies, thyroid , and kidney function. - Obtained urine sample to rule out urinary issues. - Refilled amlodipine  prescription.  Chronic lymphocytic leukemia (CLL) CLL well-managed, no significant changes. Slightly elevated  white count, no anemia. No active treatment planned.  Essential hypertension Blood pressure well-controlled with current medication. - Refilled amlodipine  prescription.  Vitamin D  deficiency and B group vitamin deficiency Fatigue may be related to these deficiencies. - Ordered blood work for vitamin deficiencies.  - amLODipine  (NORVASC ) 2.5 MG tablet; Take 1 tablet (2.5 mg total) by mouth daily.  Dispense: 90 tablet; Refill: 1 - CBC w/Diff/Platelet - Comprehensive Metabolic Panel (CMET) - TSH - Vitamin D  (25 hydroxy) - Vitamin B12  Return in about 6 months (around 11/12/2024).    Sharyle Fischer, DO

## 2024-05-16 ENCOUNTER — Ambulatory Visit: Payer: Self-pay | Admitting: Internal Medicine

## 2024-05-16 ENCOUNTER — Ambulatory Visit (INDEPENDENT_AMBULATORY_CARE_PROVIDER_SITE_OTHER): Admitting: Emergency Medicine

## 2024-05-16 DIAGNOSIS — R531 Weakness: Secondary | ICD-10-CM

## 2024-05-16 LAB — POCT URINALYSIS DIPSTICK
Bilirubin, UA: NEGATIVE
Blood, UA: NEGATIVE
Glucose, UA: NEGATIVE
Ketones, UA: NEGATIVE
Leukocytes, UA: NEGATIVE
Nitrite, UA: NEGATIVE
Protein, UA: NEGATIVE
Spec Grav, UA: 1.02 (ref 1.010–1.025)
Urobilinogen, UA: 0.2 U/dL
pH, UA: 5 (ref 5.0–8.0)

## 2024-05-16 LAB — COMPREHENSIVE METABOLIC PANEL WITH GFR
AG Ratio: 1.4 (calc) (ref 1.0–2.5)
ALT: 17 U/L (ref 6–29)
AST: 20 U/L (ref 10–35)
Albumin: 3.8 g/dL (ref 3.6–5.1)
Alkaline phosphatase (APISO): 35 U/L — ABNORMAL LOW (ref 37–153)
BUN/Creatinine Ratio: 21 (calc) (ref 6–22)
BUN: 21 mg/dL (ref 7–25)
CO2: 24 mmol/L (ref 20–32)
Calcium: 9.4 mg/dL (ref 8.6–10.4)
Chloride: 104 mmol/L (ref 98–110)
Creat: 1.02 mg/dL — ABNORMAL HIGH (ref 0.60–0.95)
Globulin: 2.7 g/dL (ref 1.9–3.7)
Glucose, Bld: 105 mg/dL — ABNORMAL HIGH (ref 65–99)
Potassium: 4 mmol/L (ref 3.5–5.3)
Sodium: 139 mmol/L (ref 135–146)
Total Bilirubin: 0.5 mg/dL (ref 0.2–1.2)
Total Protein: 6.5 g/dL (ref 6.1–8.1)
eGFR: 50 mL/min/1.73m2 — ABNORMAL LOW (ref >=60)

## 2024-05-16 LAB — CBC WITH DIFFERENTIAL/PLATELET
Absolute Lymphocytes: 4796 {cells}/uL — ABNORMAL HIGH (ref 850–3900)
Absolute Monocytes: 719 {cells}/uL (ref 200–950)
Basophils Absolute: 9 {cells}/uL (ref 0–200)
Basophils Relative: 0.1 %
Eosinophils Absolute: 109 {cells}/uL (ref 15–500)
Eosinophils Relative: 1.2 %
HCT: 38.1 % (ref 35.0–45.0)
Hemoglobin: 12.5 g/dL (ref 11.7–15.5)
MCH: 32.1 pg (ref 27.0–33.0)
MCHC: 32.8 g/dL (ref 32.0–36.0)
MCV: 97.7 fL (ref 80.0–100.0)
MPV: 9.7 fL (ref 7.5–12.5)
Monocytes Relative: 7.9 %
Neutro Abs: 3467 {cells}/uL (ref 1500–7800)
Neutrophils Relative %: 38.1 %
Platelets: 182 Thousand/uL (ref 140–400)
RBC: 3.9 Million/uL (ref 3.80–5.10)
RDW: 13 % (ref 11.0–15.0)
Total Lymphocyte: 52.7 %
WBC: 9.1 Thousand/uL (ref 3.8–10.8)

## 2024-05-16 LAB — VITAMIN D 25 HYDROXY (VIT D DEFICIENCY, FRACTURES): Vit D, 25-Hydroxy: 59 ng/mL (ref 30–100)

## 2024-05-16 LAB — VITAMIN B12: Vitamin B-12: 496 pg/mL (ref 200–1100)

## 2024-05-16 LAB — TSH: TSH: 3.61 m[IU]/L (ref 0.40–4.50)

## 2024-05-21 ENCOUNTER — Encounter: Payer: Self-pay | Admitting: Cardiology

## 2024-05-21 ENCOUNTER — Ambulatory Visit: Attending: Cardiology | Admitting: Cardiology

## 2024-05-21 VITALS — BP 120/60 | HR 62 | Ht 62.0 in | Wt 133.0 lb

## 2024-05-21 DIAGNOSIS — I4821 Permanent atrial fibrillation: Secondary | ICD-10-CM | POA: Insufficient documentation

## 2024-05-21 DIAGNOSIS — I495 Sick sinus syndrome: Secondary | ICD-10-CM | POA: Diagnosis not present

## 2024-05-21 DIAGNOSIS — E782 Mixed hyperlipidemia: Secondary | ICD-10-CM | POA: Diagnosis not present

## 2024-05-21 DIAGNOSIS — I5022 Chronic systolic (congestive) heart failure: Secondary | ICD-10-CM | POA: Diagnosis not present

## 2024-05-21 DIAGNOSIS — I6523 Occlusion and stenosis of bilateral carotid arteries: Secondary | ICD-10-CM | POA: Insufficient documentation

## 2024-05-21 DIAGNOSIS — R0989 Other specified symptoms and signs involving the circulatory and respiratory systems: Secondary | ICD-10-CM | POA: Insufficient documentation

## 2024-05-21 MED ORDER — LISINOPRIL 10 MG PO TABS: ORAL_TABLET | ORAL | 3 refills | Status: AC

## 2024-05-21 NOTE — Patient Instructions (Signed)
 Medication Instructions:  Your physician recommends that you continue on your current medications as directed. Please refer to the Current Medication list given to you today.   *If you need a refill on your cardiac medications before your next appointment, please call your pharmacy*  Lab Work: No labs ordered today  If you have labs (blood work) drawn today and your tests are completely normal, you will receive your results only by: MyChart Message (if you have MyChart) OR A paper copy in the mail If you have any lab test that is abnormal or we need to change your treatment, we will call you to review the results.  Testing/Procedures: No test ordered today   Follow-Up: At [facility], you and your health needs are our priority.  As part of our continuing mission to provide you with exceptional heart care, our providers are all part of one team.  This team includes your primary Cardiologist (physician) and Advanced Practice Providers or APPs (Physician Assistants and Nurse Practitioners) who all work together to provide you with the care you need, when you need it.  Your next appointment:   6 - 7 month(s)  Provider:   You may see Lonni Hanson, MD or one of the following Advanced Practice Providers on your designated Care Team:   Tylene Lunch, NP

## 2024-05-21 NOTE — Progress Notes (Signed)
 Cardiology Office Note   Date:  05/21/2024  ID:  Sydney Mcdaniel, DOB 03-18-1925, MRN [id number] PCP: Bernardo Fend, DO  [facility] Providers Cardiologist:  Lonni Hanson, MD Electrophysiologist:  Fonda Kitty, MD  Electrophysiology APP:  Riddle, Suzann, NP     History of Present Illness Sydney Mcdaniel is a 89 y.o. female with a past medical history of HFmrEF, paroxysmal atrial fibrillation, syncope, carotid atherosclerosis, sick sinus syndrome status post permanent pacemaker implantation (08/2021), CLL, hypertension, hyperlipidemia, who presents today for follow-up of her paroxysmal atrial fibrillation and chronic HFmrEF.   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   She was seen in clinic 08/29/2023 accompanied by her daughter. She continued complaints of generalized weakness and occasional shortness of breath. She had seen in clinic last month for repeat echocardiogram that was done which was unchanged from prior studies. She states they have followed up with each one of her providers with the exception of her CLL doctor. There were no medication changes that were made and further testing that was ordered at that time.   She was last seen in  clinic 12/05/2023 accompanied by her daughter.  She occasionally has weakness that was improved by eating a little bit more frequently during the day and had noted weight gain.  She previously been seen by Dr. Fernande.  Blood pressures were better controlled at home.  No medication changes that were made or further testing that was ordered.   She returns to clinic today accompanied by her daughter.  Overall she states that she has been doing well.  She denies any chest pain, shortness of breath or dyspnea on exertion.  She states that for her age she is doing very well she is little bit slower.  She has increased her weight with an increase in her eating with no further complaints of weakness.  States that she has been compliant with her current medication regimen without any undue side effects.  She has not missed any doses of her rivaroxaban  and denies any issues with bleeding with no blood noted in her urine or stool.  She also continues to deny any hospitalizations or visits to the emergency department.  ROS: 10 point review of system has been reviewed and considered negative the exception was been listed in the HPI  Studies Reviewed EKG Interpretation Date/Time:  Wednesday May 21 2024 09:25:32 EST Ventricular Rate:  62 PR Interval:    QRS Duration:  132 QT Interval:  428 QTC Calculation: 434 R Axis:   67  Text Interpretation: Atrial fibrillation with frequent ventricular-paced complexes Non-specific intra-ventricular conduction block Minimal voltage criteria for LVH, may be normal variant ( Cornell product ) T wave abnormality, consider inferolateral ischemia When compared with ECG of 25-Mar-2024 10:49, No significant change since last tracing Confirmed by Cardelia Sassano,  Moustapha Tooker ([id number]) on 05/21/2024 9:30:31 AM    2D echo 08/15/2023 1. Left ventricular ejection fraction, by estimation, is 40 to 45%. The  left ventricle has mildly decreased function. The left ventricle  demonstrates regional wall  motion abnormalities (hypokinesis of the  anteroseptal wall in setting of bundle branch  block/paced rhythm). The average left ventricular global longitudinal  strain is -10.2 %. The global longitudinal strain is abnormal.   2. Right ventricular systolic function is normal. The right ventricular  size is normal. There is normal pulmonary artery systolic pressure. The  estimated right ventricular systolic pressure is 29.4 mmHg.   3. The mitral valve is normal in structure. Moderate mitral valve  regurgitation. No evidence of mitral stenosis.   4. Tricuspid valve regurgitation is mild to moderate.   5. The aortic valve is normal in structure. Aortic valve regurgitation is  mild. No aortic stenosis is present.   6. The inferior vena cava is normal in size with greater than 50%  respiratory variability, suggesting right atrial pressure of 3 mmHg.    2D echo 03/09/2023 1. Left ventricular ejection fraction, by estimation, is 40 to 45%. Left  ventricular ejection fraction by 2D MOD biplane is 44.1 %. The left  ventricle has mild to moderately decreased function. The left ventricle  demonstrates global hypokinesis. There  is mild left ventricular hypertrophy. Left ventricular diastolic  parameters are indeterminate.   2. Right ventricular systolic function is normal. The right ventricular  size is normal.   3. The mitral valve is normal in structure. Mild mitral valve  regurgitation.   4. The aortic valve is tricuspid. Aortic valve regurgitation is mild.  Aortic valve sclerosis/calcification is present, without any evidence of  aortic stenosis.   5. The inferior vena cava is normal in size with greater than 50%  respiratory variability, suggesting right atrial pressure of 3 mmHg.   Risk Assessment/Calculations  CHA2DS2-VASc Score = 6   This indicates a 9.7% annual risk of stroke. The patient's score is based upon: CHF History: 1 HTN History: 1 Diabetes History: 0 Stroke History:  0 Vascular Disease History: 1 Age Score: 2 Gender Score: 1            Physical Exam VS:  BP 120/60 (BP Location: Left Arm, Patient Position: Sitting, Cuff Size: Normal)   Pulse 62   Ht 5' 2 (1.575 m)   Wt 133 lb (60.3 kg)   LMP  (LMP Unknown)   SpO2 99%   BMI 24.33 kg/m        Wt Readings from Last 3 Encounters:  05/21/24 133 lb (60.3 kg)  05/15/24 133 lb 12.8 oz (60.7 kg)  03/25/24 134 lb (60.8 kg)    GEN: Well nourished, well developed in no acute distress NECK: No JVD; No carotid bruits CARDIAC: IR IR, no murmurs, rubs, gallops RESPIRATORY:  Clear to auscultation without rales, wheezing or rhonchi  ABDOMEN: Soft, non-tender, non-distended EXTREMITIES: Trace pretibial edema; No deformity   ASSESSMENT AND PLAN Chronic HFmrEF with moderately reduced ejection fraction last echocardiogram with LVEF 40-45%.  She states that she has been doing well from cardiac perspective and denies any shortness of breath dyspnea on exertion.  Weight has remained stable.  She has continued on spironolactone  25 mg daily, lisinopril  10 mg daily for blood pressure greater than 160 mmHg. she continues to suffer from NYHA class II symptoms and is essentially euvolemic on exam.  She does not require standing diuretic therapy.  No symptoms of  decompensation noted today.  Labile hypertension with a history of orthostatic hypotension with a blood pressure today 120/60.  Blood pressure continues to remain stable on amlodipine  2.5 mg daily, lisinopril  10 mg as needed for systolic blood pressure greater than 160 mmHg and spironolactone  25 mg daily.  Have been encouraged to continue to monitor pressures 1 to 2 hours postmedication administration at home as well.  Permanent atrial fibrillation with EKG today revealing atrial fibrillation with a rate of 62 with aberrantly conducted beats versus PVCs IVCD and LVH with no ischemic or acute changes noted from prior studies.  She has been continued on rivaroxaban  50  mg daily as she meets reduced dosing criteria for CHA2DS2-VASc or at least 6 for stroke prophylaxis.  She does not require beta-blocker therapy.  She has tolerated anticoagulation without concerns for bleeding and has stable hemoglobin.  Sick sinus syndrome status post pacemaker implantation continue to be followed by EP.  Carotid artery disease with last carotid duplex completed September 2024 revealing minimal stenosis 1-39% to the bilateral carotids.  Mixed hyperlipidemia where she is stable LDL.  She is continued on atorvastatin  20 mg daily with ongoing management per PCP.       Dispo: Patient to return to clinic see MD/APP in 6 months or sooner if needed for further evaluation.  Signed, Clelia Trabucco, NP

## 2024-06-04 ENCOUNTER — Other Ambulatory Visit: Payer: Self-pay | Admitting: Internal Medicine

## 2024-06-04 DIAGNOSIS — E039 Hypothyroidism, unspecified: Secondary | ICD-10-CM

## 2024-06-05 ENCOUNTER — Other Ambulatory Visit: Payer: Self-pay | Admitting: Internal Medicine

## 2024-06-05 DIAGNOSIS — E039 Hypothyroidism, unspecified: Secondary | ICD-10-CM

## 2024-06-05 NOTE — Telephone Encounter (Signed)
 Requested Prescriptions  Pending Prescriptions Disp Refills   levothyroxine  (SYNTHROID ) 75 MCG tablet [Pharmacy Med Name: Levothyroxine  Sodium 75 MCG Oral Tablet] 48 tablet 2    Sig: TAKE 1 TABLET BY MOUTH ON MONDAYS, WEDNESDAYS, FRIDAYS, AND SUNDAYS     Endocrinology:  Hypothyroid Agents Passed - 06/05/2024  5:35 PM      Passed - TSH in normal range and within 360 days    TSH  Date Value Ref Range Status  05/15/2024 3.61 0.40 - 4.50 mIU/L Final         Passed - Valid encounter within last 12 months    Recent Outpatient Visits           3 weeks ago Essential hypertension   [facility] Bernardo Fend, DO   3 months ago Fatigue, unspecified type   [facility] Leavy Mole, PA-C   6 months ago Essential hypertension   [facility] Bernardo Fend, DO   10 months ago Weakness   [facility] Bernardo Fend, [place]

## 2024-06-09 DIAGNOSIS — Z961 Presence of intraocular lens: Secondary | ICD-10-CM | POA: Diagnosis not present

## 2024-06-09 DIAGNOSIS — H18413 Arcus senilis, bilateral: Secondary | ICD-10-CM | POA: Diagnosis not present

## 2024-06-09 DIAGNOSIS — H18221 Idiopathic corneal edema, right eye: Secondary | ICD-10-CM | POA: Diagnosis not present

## 2024-06-09 DIAGNOSIS — H1811 Bullous keratopathy, right eye: Secondary | ICD-10-CM | POA: Diagnosis not present

## 2024-06-09 DIAGNOSIS — H401133 Primary open-angle glaucoma, bilateral, severe stage: Secondary | ICD-10-CM | POA: Diagnosis not present

## 2024-06-09 NOTE — Telephone Encounter (Signed)
 Requested Prescriptions  Pending Prescriptions Disp Refills   levothyroxine  (SYNTHROID ) 88 MCG tablet [Pharmacy Med Name: Levothyroxine  Sodium 88 MCG Oral Tablet] 36 tablet 0    Sig: TAKE 1 TABLET BY MOUTH ONCE DAILY ON TUESDAYS, THURSDAYS AND SATURDAYS     Endocrinology:  Hypothyroid Agents Passed - 06/09/2024 11:52 AM      Passed - TSH in normal range and within 360 days    TSH  Date Value Ref Range Status  05/15/2024 3.61 0.40 - 4.50 mIU/L Final         Passed - Valid encounter within last 12 months    Recent Outpatient Visits           3 weeks ago Essential hypertension   [facility] Bernardo Fend, DO   3 months ago Fatigue, unspecified type   [facility] Leavy Mole, PA-C   6 months ago Essential hypertension   [facility] Bernardo Fend, DO   10 months ago Weakness   [facility] Bernardo Fend, DO               levothyroxine  (SYNTHROID ) 75 MCG tablet [Pharmacy Med Name: Levothyroxine  Sodium 75 MCG Oral Tablet] 48 tablet 0    Sig: TAKE 1 TABLET BY MOUTH ON MONDAYS, WEDNESDAYS, FRIDAYS, AND SUNDAYS     Endocrinology:  Hypothyroid Agents Passed - 06/09/2024 11:52 AM      Passed - TSH in normal range and within 360 days    TSH  Date Value Ref Range Status  05/15/2024 3.61 0.40 - 4.50 mIU/L Final         Passed - Valid encounter within last 12 months    Recent Outpatient Visits           3 weeks ago Essential hypertension   [facility] Bernardo Fend, DO   3 months ago Fatigue, unspecified type   [facility] Leavy Mole, PA-C   6 months ago Essential hypertension   [facility] Bernardo Fend, DO   10 months ago Weakness   [facility] Bernardo Fend, [place]

## 2024-06-25 ENCOUNTER — Ambulatory Visit

## 2024-06-25 DIAGNOSIS — I480 Paroxysmal atrial fibrillation: Secondary | ICD-10-CM

## 2024-06-26 LAB — CUP PACEART REMOTE DEVICE CHECK
Date Time Interrogation Session: 20251231074858
Implantable Lead Connection Status: 753985
Implantable Lead Connection Status: 753985
Implantable Lead Implant Date: 20230330
Implantable Lead Implant Date: 20230330
Implantable Lead Location: 753859
Implantable Lead Location: 753860
Implantable Lead Model: 377
Implantable Lead Model: 399
Implantable Lead Serial Number: 8000191707
Implantable Lead Serial Number: 8000584009
Implantable Pulse Generator Implant Date: 20230330
Pulse Gen Model: 407145
Pulse Gen Serial Number: 70372674

## 2024-06-29 ENCOUNTER — Ambulatory Visit: Payer: Self-pay | Admitting: Cardiology

## 2024-06-30 NOTE — Progress Notes (Signed)
 Remote PPM Transmission

## 2024-07-13 ENCOUNTER — Other Ambulatory Visit: Payer: Self-pay

## 2024-07-13 ENCOUNTER — Emergency Department

## 2024-07-13 ENCOUNTER — Inpatient Hospital Stay

## 2024-07-13 ENCOUNTER — Inpatient Hospital Stay
Admission: EM | Admit: 2024-07-13 | Discharge: 2024-07-15 | DRG: 871 | Disposition: A | Discharge status: Home / Self Care | Attending: Obstetrics and Gynecology | Admitting: Obstetrics and Gynecology

## 2024-07-13 DIAGNOSIS — I10 Essential (primary) hypertension: Secondary | ICD-10-CM | POA: Diagnosis present

## 2024-07-13 DIAGNOSIS — I5032 Chronic diastolic (congestive) heart failure: Secondary | ICD-10-CM | POA: Diagnosis present

## 2024-07-13 DIAGNOSIS — I495 Sick sinus syndrome: Secondary | ICD-10-CM | POA: Diagnosis present

## 2024-07-13 DIAGNOSIS — E222 Syndrome of inappropriate secretion of antidiuretic hormone: Secondary | ICD-10-CM | POA: Diagnosis present

## 2024-07-13 DIAGNOSIS — I480 Paroxysmal atrial fibrillation: Secondary | ICD-10-CM | POA: Diagnosis present

## 2024-07-13 DIAGNOSIS — R6520 Severe sepsis without septic shock: Secondary | ICD-10-CM | POA: Diagnosis present

## 2024-07-13 DIAGNOSIS — A419 Sepsis, unspecified organism: Secondary | ICD-10-CM | POA: Diagnosis not present

## 2024-07-13 DIAGNOSIS — Z79890 Hormone replacement therapy (postmenopausal): Secondary | ICD-10-CM

## 2024-07-13 DIAGNOSIS — E785 Hyperlipidemia, unspecified: Secondary | ICD-10-CM | POA: Diagnosis present

## 2024-07-13 DIAGNOSIS — Z90710 Acquired absence of both cervix and uterus: Secondary | ICD-10-CM

## 2024-07-13 DIAGNOSIS — J189 Pneumonia, unspecified organism: Secondary | ICD-10-CM | POA: Diagnosis present

## 2024-07-13 DIAGNOSIS — Z8249 Family history of ischemic heart disease and other diseases of the circulatory system: Secondary | ICD-10-CM

## 2024-07-13 DIAGNOSIS — E039 Hypothyroidism, unspecified: Secondary | ICD-10-CM | POA: Diagnosis present

## 2024-07-13 DIAGNOSIS — I110 Hypertensive heart disease with heart failure: Secondary | ICD-10-CM | POA: Diagnosis present

## 2024-07-13 DIAGNOSIS — D696 Thrombocytopenia, unspecified: Secondary | ICD-10-CM | POA: Diagnosis present

## 2024-07-13 DIAGNOSIS — Z7901 Long term (current) use of anticoagulants: Secondary | ICD-10-CM

## 2024-07-13 DIAGNOSIS — E872 Acidosis, unspecified: Secondary | ICD-10-CM | POA: Diagnosis present

## 2024-07-13 DIAGNOSIS — C9110 Chronic lymphocytic leukemia of B-cell type not having achieved remission: Secondary | ICD-10-CM | POA: Diagnosis present

## 2024-07-13 DIAGNOSIS — Z950 Presence of cardiac pacemaker: Secondary | ICD-10-CM

## 2024-07-13 DIAGNOSIS — Z9049 Acquired absence of other specified parts of digestive tract: Secondary | ICD-10-CM

## 2024-07-13 DIAGNOSIS — G9341 Metabolic encephalopathy: Secondary | ICD-10-CM | POA: Diagnosis not present

## 2024-07-13 DIAGNOSIS — Z66 Do not resuscitate: Secondary | ICD-10-CM | POA: Diagnosis present

## 2024-07-13 DIAGNOSIS — M810 Age-related osteoporosis without current pathological fracture: Secondary | ICD-10-CM | POA: Diagnosis present

## 2024-07-13 DIAGNOSIS — G3184 Mild cognitive impairment, so stated: Secondary | ICD-10-CM | POA: Diagnosis present

## 2024-07-13 DIAGNOSIS — H409 Unspecified glaucoma: Secondary | ICD-10-CM | POA: Diagnosis present

## 2024-07-13 DIAGNOSIS — N3000 Acute cystitis without hematuria: Secondary | ICD-10-CM | POA: Diagnosis present

## 2024-07-13 DIAGNOSIS — Z83511 Family history of glaucoma: Secondary | ICD-10-CM

## 2024-07-13 DIAGNOSIS — Z79899 Other long term (current) drug therapy: Secondary | ICD-10-CM

## 2024-07-13 HISTORY — DX: Hypo-osmolality and hyponatremia: E87.1

## 2024-07-13 LAB — COMPREHENSIVE METABOLIC PANEL WITH GFR
ALT: 11 U/L (ref 0–44)
AST: 26 U/L (ref 15–41)
Albumin: 3.6 g/dL (ref 3.5–5.0)
Alkaline Phosphatase: 40 U/L (ref 38–126)
Anion gap: 11 (ref 5–15)
BUN: 16 mg/dL (ref 8–23)
CO2: 21 mmol/L — ABNORMAL LOW (ref 22–32)
Calcium: 9.3 mg/dL (ref 8.9–10.3)
Chloride: 101 mmol/L (ref 98–111)
Creatinine, Ser: 0.78 mg/dL (ref 0.44–1.00)
GFR, Estimated: >60 mL/min
Glucose, Bld: 105 mg/dL — ABNORMAL HIGH (ref 70–99)
Potassium: 4.1 mmol/L (ref 3.5–5.1)
Sodium: 133 mmol/L — ABNORMAL LOW (ref 135–145)
Total Bilirubin: 0.8 mg/dL (ref 0.0–1.2)
Total Protein: 6.7 g/dL (ref 6.5–8.1)

## 2024-07-13 LAB — URINALYSIS, ROUTINE W REFLEX MICROSCOPIC
Bacteria, UA: NONE SEEN
Bilirubin Urine: NEGATIVE
Glucose, UA: NEGATIVE mg/dL
Ketones, ur: NEGATIVE mg/dL
Nitrite: NEGATIVE
Protein, ur: NEGATIVE mg/dL
Specific Gravity, Urine: 1.012 (ref 1.005–1.030)
pH: 6 (ref 5.0–8.0)

## 2024-07-13 LAB — CBC
HCT: 37.1 % (ref 36.0–46.0)
Hemoglobin: 12.6 g/dL (ref 12.0–15.0)
MCH: 33.3 pg (ref 26.0–34.0)
MCHC: 34 g/dL (ref 30.0–36.0)
MCV: 98.1 fL (ref 80.0–100.0)
Platelets: 185 K/uL (ref 150–400)
RBC: 3.78 MIL/uL — ABNORMAL LOW (ref 3.87–5.11)
RDW: 13.3 % (ref 11.5–15.5)
WBC: 17.4 K/uL — ABNORMAL HIGH (ref 4.0–10.5)
nRBC: 0 % (ref 0.0–0.2)

## 2024-07-13 LAB — MAGNESIUM: Magnesium: 1.9 mg/dL (ref 1.7–2.4)

## 2024-07-13 LAB — LACTIC ACID, PLASMA: Lactic Acid, Venous: 2.1 mmol/L (ref 0.5–1.9)

## 2024-07-13 MED ORDER — RIVAROXABAN 15 MG PO TABS
15.0000 mg | ORAL_TABLET | Freq: Every day | ORAL | Status: DC
  Administered 2024-07-13 – 2024-07-15 (×3): 15 mg via ORAL
  Filled 2024-07-13 (×4): qty 1

## 2024-07-13 MED ORDER — IOHEXOL 300 MG/ML  SOLN
100.0000 mL | Freq: Once | INTRAMUSCULAR | Status: AC | PRN
  Administered 2024-07-13: 100 mL via INTRAVENOUS

## 2024-07-13 MED ORDER — ONDANSETRON HCL 4 MG PO TABS
4.0000 mg | ORAL_TABLET | Freq: Four times a day (QID) | ORAL | Status: DC | PRN
  Administered 2024-07-14: 4 mg via ORAL
  Filled 2024-07-13: qty 1

## 2024-07-13 MED ORDER — LACTATED RINGERS IV SOLN: INTRAVENOUS | Status: DC

## 2024-07-13 MED ORDER — SODIUM CHLORIDE 0.9 % IV SOLN
1.0000 g | INTRAVENOUS | Status: DC
  Administered 2024-07-14: 1 g via INTRAVENOUS
  Filled 2024-07-13 (×2): qty 10

## 2024-07-13 MED ORDER — ACETAMINOPHEN 325 MG PO TABS: 650.0000 mg | ORAL_TABLET | Freq: Four times a day (QID) | ORAL | Status: DC | PRN

## 2024-07-13 MED ORDER — FENTANYL CITRATE (PF) 50 MCG/ML IJ SOSY
50.0000 ug | PREFILLED_SYRINGE | Freq: Once | INTRAMUSCULAR | Status: AC
  Administered 2024-07-13: 50 ug via INTRAVENOUS
  Filled 2024-07-13: qty 1

## 2024-07-13 MED ORDER — SODIUM CHLORIDE 0.9 % IV SOLN: 500.0000 mg | Freq: Once | INTRAVENOUS | Status: DC

## 2024-07-13 MED ORDER — ONDANSETRON HCL 4 MG/2ML IJ SOLN: 4.0000 mg | Freq: Four times a day (QID) | INTRAMUSCULAR | Status: DC | PRN

## 2024-07-13 MED ORDER — SODIUM CHLORIDE 0.9% FLUSH
3.0000 mL | Freq: Two times a day (BID) | INTRAVENOUS | Status: DC
  Administered 2024-07-13 – 2024-07-15 (×4): 3 mL via INTRAVENOUS

## 2024-07-13 MED ORDER — SODIUM CHLORIDE 0.9 % IV SOLN
100.0000 mg | Freq: Once | INTRAVENOUS | Status: AC
  Administered 2024-07-13: 100 mg via INTRAVENOUS
  Filled 2024-07-13: qty 100

## 2024-07-13 MED ORDER — SODIUM CHLORIDE 0.9 % IV SOLN: 100.0000 mg | INTRAVENOUS | Status: DC

## 2024-07-13 MED ORDER — ACETAMINOPHEN 650 MG RE SUPP: 650.0000 mg | Freq: Four times a day (QID) | RECTAL | Status: DC | PRN

## 2024-07-13 MED ORDER — SODIUM CHLORIDE 0.9 % IV BOLUS (SEPSIS)
500.0000 mL | Freq: Once | INTRAVENOUS | Status: AC
  Administered 2024-07-13: 500 mL via INTRAVENOUS

## 2024-07-13 MED ORDER — SENNOSIDES-DOCUSATE SODIUM 8.6-50 MG PO TABS: 1.0000 | ORAL_TABLET | Freq: Every evening | ORAL | Status: DC | PRN

## 2024-07-13 MED ORDER — ONDANSETRON HCL 4 MG/2ML IJ SOLN
4.0000 mg | Freq: Once | INTRAMUSCULAR | Status: AC
  Administered 2024-07-13: 4 mg via INTRAVENOUS
  Filled 2024-07-13: qty 2

## 2024-07-13 MED ORDER — MORPHINE SULFATE (PF) 2 MG/ML IV SOLN
2.0000 mg | Freq: Once | INTRAVENOUS | Status: AC
  Administered 2024-07-13: 2 mg via INTRAVENOUS
  Filled 2024-07-13: qty 1

## 2024-07-13 MED ORDER — SODIUM CHLORIDE 0.9 % IV SOLN
100.0000 mg | Freq: Two times a day (BID) | INTRAVENOUS | Status: DC
  Administered 2024-07-14 – 2024-07-15 (×2): 100 mg via INTRAVENOUS
  Filled 2024-07-13 (×4): qty 100

## 2024-07-13 MED ORDER — LACTATED RINGERS IV SOLN: INTRAVENOUS | Status: AC

## 2024-07-13 MED ORDER — HEPARIN SODIUM (PORCINE) 5000 UNIT/ML IJ SOLN: 5000.0000 [IU] | Freq: Three times a day (TID) | INTRAMUSCULAR | Status: DC

## 2024-07-13 MED ORDER — SODIUM CHLORIDE 0.9 % IV SOLN
1.0000 g | Freq: Once | INTRAVENOUS | Status: AC
  Administered 2024-07-13: 1 g via INTRAVENOUS
  Filled 2024-07-13: qty 10

## 2024-07-13 MED ORDER — IPRATROPIUM-ALBUTEROL 0.5-2.5 (3) MG/3ML IN SOLN: 3.0000 mL | Freq: Four times a day (QID) | RESPIRATORY_TRACT | Status: DC | PRN

## 2024-07-13 NOTE — H&P (Signed)
 " History and Physical    Sydney Mcdaniel FMW:[id number] DOB: 10/16/1924 DOA: 07/13/2024  DOS: the patient was seen and examined on 07/13/2024  PCP: Bernardo Fend, DO   Patient coming from: Home  I have personally briefly reviewed patient's old medical records in [facility] and CareEverywhere  HPI:   Sydney Mcdaniel is a 89 y.o. year old female with medical history of hypertension, hyperlipidemia, hypothyroidism, paroxysmal atrial fibrillation, sick sinus syndrome status post pacemaker in 2023, CLL presenting to the ED with alteration in mental status since yesterday.  Family at bedside stating patient has been having some coughing since yesterday.  She has also been having frequent urination.  Denied any sick contacts.  Patient unable to answer orientation questions other than her name.  Per family she is looking better than earlier today.  On arrival to the ED patient was noted to be HDS stable.  Lab work and imaging obtained.  CBC with significant leukocytosis at 17.4, normal hemoglobin.  CMP with mild hyponatremia, non-anion gap metabolic acidosis-mild.  UA consistent with UTI.  Code sepsis initiated on arrival and lactic acid mildly elevated at 2.1.  CT abdomen pelvis obtained with contrast showed an opacity in the right lower lobe concerning for pneumonia versus pneumonitis but otherwise no acute findings.  CT head without any acute findings.  Given need for further care, [facility] contacted for admission.  Review of Systems: unable to review all systems due to the inability of the patient to answer questions.   Past Medical History:  Diagnosis Date   Allergy    Anemia    Cardiomyopathy (HCC)    a. 02/2019 Echo: EF 60-65%, no rwma, doppler parameters consistent w/ pseudonormalization. Nl RV size/fxn; b. 06/2021 Echo: EF 40-45% w/ sev basal-mid antsept HK. GrII DD. Nl RV size/fxn. Mild MR/AI.   Carotid arterial disease    a. 04/2020 Carotid U/S: 1-39% bilat ICA stenosis, <50% bilat  ECA stenosis; b. 06/2021 Carotid U/S: RICA 1-39%, LICA 40-59%.   Cataract    CLL (chronic lymphocytic leukemia) (HCC)    GERD (gastroesophageal reflux disease)    Glaucoma    H/O: hysterectomy    Total   History of stress test    a. 02/2019 MV: EF 60%, no ischemia/infarct.   Hyperlipidemia    Hypertension    Hyponatremia    Hypothyroidism    Impaired fasting glucose    Lichen sclerosus    Osteoporosis    Hips   PAF (paroxysmal atrial fibrillation) (HCC)    a. 06/15/2016 Event monitor: 4% afib burden; b. CHA2DS2VASc - 4-->Xarelto .   Sinus bradycardia    a. avoid AVN blocking agents.   Syncope    a. 03/2016 Echo: EF 55-60%, no rwma, mild AI/MR, nl PASP; b. 03/2016 48h Holter: no significant arrhythmias/pauses; c. 03/2016 MV: mild apical defect, likely breast attenuation, nl EF, low risk; d. 05/2016 Event monitor: No significant arrhythmia; e. 05/2016 Event monitor: PAF (4%).    Past Surgical History:  Procedure Laterality Date   APPENDECTOMY     EYE SURGERY     Glaucoma   MULTIPLE TOOTH EXTRACTIONS N/A    2 teeth   PACEMAKER IMPLANT N/A 09/22/2021   Procedure: PACEMAKER IMPLANT;  Surgeon: Fernande Elspeth BROCKS, MD;  Location: [facility] INVASIVE CV LAB;  Service: Cardiovascular;  Laterality: N/A;   TOTAL ABDOMINAL HYSTERECTOMY       Allergies[1]  Family History  Problem Relation Age of Onset   Heart attack Mother  Glaucoma Mother    Heart attack Father    Parkinson's disease Brother    Heart attack Brother     Prior to Admission medications  Medication Sig Start Date End Date Taking? Authorizing Provider  acetaminophen  (TYLENOL ) 500 MG tablet Take 500 mg by mouth every 8 (eight) hours as needed for moderate pain.   Yes [provider]  amLODipine  (NORVASC ) 2.5 MG tablet Take 1 tablet (2.5 mg total) by mouth daily. 05/15/24  Yes Bernardo Fend, DO  atorvastatin  (LIPITOR) 20 MG tablet Take 1 tablet by mouth in the evening 02/15/24  Yes Bernardo Fend, DO  Calcium   Carbonate-Vit D-Min (CALCIUM  1200 PO) Take 1,000 Units by mouth daily. daily   Yes [provider]  Cholecalciferol  25 MCG (1000 UT) tablet Take 1,000 Units by mouth daily.   Yes [provider]  dorzolamide  (TRUSOPT ) 2 % ophthalmic solution Place 1 drop into the right eye 2 (two) times daily. 08/21/23  Yes [provider]  ferrous sulfate  325 (65 FE) MG tablet Take 2 tablets (650 mg total) by mouth daily with breakfast. 01/31/22  Yes Jens Durand, MD  latanoprost  (XALATAN ) 0.005 % ophthalmic solution Place 1 drop into the right eye at bedtime. 06/15/24  Yes [provider]  levothyroxine  (SYNTHROID ) 75 MCG tablet TAKE 1 TABLET BY MOUTH ON MONDAYS, WEDNESDAYS, FRIDAYS, AND SUNDAYS 06/05/24  Yes Bernardo Fend, DO  levothyroxine  (SYNTHROID ) 88 MCG tablet TAKE 1 TABLET BY MOUTH ONCE DAILY ON TUESDAYS, THURSDAYS AND SATURDAYS Patient taking differently: Take 88 mcg by mouth daily before breakfast. TAKE 1 TABLET BY MOUTH ONCE DAILY ON TUESDAYS, THURSDAYS AND SATURDAYS 06/09/24  Yes Bernardo Fend, DO  lisinopril  (ZESTRIL ) 10 MG tablet Take 1 tablet by mouth daily as needed for systolic BP > than 160. 05/21/24  Yes Hammock, Sheri, NP  ondansetron  (ZOFRAN ) 8 MG tablet Take 8 mg by mouth as needed. 03/07/22  Yes [provider]  polyethylene glycol (MIRALAX  / GLYCOLAX ) 17 g packet Take 17 g by mouth daily.   Yes [provider]  Rivaroxaban  (XARELTO ) 15 MG TABS tablet TAKE 1 TABLET EVERY DAY WITH SUPPER 02/22/24  Yes End, Lonni, MD  sodium chloride  (MURO 128) 5 % ophthalmic solution Place 1 drop into the right eye in the morning and at bedtime. Place 1 drop into the right eye in the morning, and at supper. 06/29/22  Yes [provider]  sodium chloride  1 g tablet Take 1 g by mouth. Takes 1/2 tablet with breakfast, 1/2 tablet with lunch, full tablet at supper   Yes [provider]  spironolactone  (ALDACTONE ) 25 MG tablet Take 1  tablet by mouth once daily 08/27/23  Yes End, Lonni, MD  timolol  (TIMOPTIC ) 0.5 % ophthalmic solution Place 1 drop into the right eye 2 (two) times daily. 09/11/22  Yes [provider]  vitamin B-12 (CYANOCOBALAMIN ) 100 MCG tablet Take 100 mcg by mouth daily.   Yes [provider]    Social History:  reports that she has never smoked. She has never used smokeless tobacco. She reports that she does not drink alcohol and does not use drugs.    Physical Exam: Vitals:   07/13/24 1430 07/13/24 1500 07/13/24 1530 07/13/24 1600  BP: (!) 135/43 96/85  91/76  Pulse: 80 83 89 82  Resp: 18 (!) 29 (!) 22 (!) 29  Temp:      TempSrc:      SpO2: 98% 100% 100% 95%  Weight:  Height:        Gen: NAD HENT: NCAT, nasal cannula in place CV: Irregularly irregular, good radial pulse Lung: Rhonchi present in bilateral lungs Abd: No TTP, normal bowel sounds MSK: No asymmetry, good bulk and tone Neuro: alert but oriented x 1, no focal deficits present   Labs on Admission: I have personally reviewed following labs and imaging studies  CBC: Recent Labs  Lab 07/13/24 1200  WBC 17.4*  HGB 12.6  HCT 37.1  MCV 98.1  PLT 185   Basic Metabolic Panel: Recent Labs  Lab 07/13/24 1200  NA 133*  K 4.1  CL 101  CO2 21*  GLUCOSE 105*  BUN 16  CREATININE 0.78  CALCIUM  9.3   GFR: Estimated Creatinine Clearance: 37.5 mL/min (by C-G formula based on SCr of 0.78 mg/dL). Liver Function Tests: Recent Labs  Lab 07/13/24 1200  AST 26  ALT 11  ALKPHOS 40  BILITOT 0.8  PROT 6.7  ALBUMIN 3.6   No results for input(s): LIPASE, AMYLASE in the last 168 hours. No results for input(s): AMMONIA in the last 168 hours. Coagulation Profile: No results for input(s): INR, PROTIME in the last 168 hours. Cardiac Enzymes: No results for input(s): CKTOTAL, CKMB, CKMBINDEX, TROPONINI, TROPONINIHS in the last 168 hours. BNP (last 3 results) No results for input(s):  BNP in the last 8760 hours. HbA1C: No results for input(s): HGBA1C in the last 72 hours. CBG: No results for input(s): GLUCAP in the last 168 hours. Lipid Profile: No results for input(s): CHOL, HDL, LDLCALC, TRIG, CHOLHDL, LDLDIRECT in the last 72 hours. Thyroid  Function Tests: No results for input(s): TSH, T4TOTAL, FREET4, T3FREE, THYROIDAB in the last 72 hours. Anemia Panel: No results for input(s): VITAMINB12, FOLATE, FERRITIN, TIBC, IRON, RETICCTPCT in the last 72 hours. Urine analysis:    Component Value Date/Time   COLORURINE YELLOW (A) 07/13/2024 1350   APPEARANCEUR CLEAR (A) 07/13/2024 1350   LABSPEC 1.012 07/13/2024 1350   PHURINE 6.0 07/13/2024 1350   GLUCOSEU NEGATIVE 07/13/2024 1350   HGBUR SMALL (A) 07/13/2024 1350   BILIRUBINUR NEGATIVE 07/13/2024 1350   BILIRUBINUR negative 05/16/2024 1034   KETONESUR NEGATIVE 07/13/2024 1350   PROTEINUR NEGATIVE 07/13/2024 1350   UROBILINOGEN 0.2 05/16/2024 1034   NITRITE NEGATIVE 07/13/2024 1350   LEUKOCYTESUR MODERATE (A) 07/13/2024 1350    Radiological Exams on Admission: I have personally reviewed images CT ABDOMEN PELVIS W CONTRAST Result Date: 07/13/2024 EXAM: CT ABDOMEN AND PELVIS WITH CONTRAST 07/13/2024 03:08:26 PM TECHNIQUE: CT of the abdomen and pelvis was performed with the administration of 100 mL Omnipaque  contrast. Multiplanar reformatted images are provided for review. Automated exposure control, iterative reconstruction, and/or weight-based adjustment of the mA/kV was utilized to reduce the radiation dose to as low as reasonably achievable. COMPARISON: None available. CLINICAL HISTORY: altered, leukocytosis, lower abd pain FINDINGS: LOWER CHEST: There is nodular consolidation in the medial aspect of the right lower lobe consistent with pneumonia versus aspiration pneumonitis. LIVER: The liver is unremarkable. GALLBLADDER AND BILE DUCTS: Cholecystectomy. SPLEEN: No acute  abnormality. PANCREAS: No acute abnormality. ADRENAL GLANDS: No acute abnormality. KIDNEYS, URETERS AND BLADDER: No stones in the kidneys or ureters. No hydronephrosis. No perinephric or periureteral stranding. The bladder is moderately distended. Small amount of gas in the bladder presumably related to instrumentation. GI AND BOWEL: Stomach demonstrates no acute abnormality. No small bowel obstruction or inflammation. Appendix is not identified. Colon is normal. PERITONEUM AND RETROPERITONEUM: No ascites. No free air. VASCULATURE: Aorta is normal in caliber.  LYMPH NODES: No lymphadenopathy. REPRODUCTIVE ORGANS: No acute abnormality. BONES AND SOFT TISSUES: No acute osseous abnormality. No focal soft tissue abnormality. IMPRESSION: 1. Nodular consolidation in the medial aspect of the right lower lobe, consistent with pneumonia versus aspiration pneumonitis. 2.  No bowel obstruction or inflammatory process seen. 3. Bladder distention. Electronically signed by: Norleen Boxer MD 07/13/2024 04:05 PM EST RP Workstation: [id number]   CT HEAD WO CONTRAST ( ) Result Date: 07/13/2024 EXAM: CT HEAD WITHOUT CONTRAST 07/13/2024 03:08:26 PM TECHNIQUE: CT of the head was performed without the administration of intravenous contrast. Automated exposure control, iterative reconstruction, and/or weight based adjustment of the mA/kV was utilized to reduce the radiation dose to as low as reasonably achievable. COMPARISON: Comparison is 87:23. CLINICAL HISTORY: nonfocal altered FINDINGS: BRAIN AND VENTRICLES: No acute hemorrhage. No evidence of acute infarct. No hydrocephalus. No extra-axial collection. No mass effect or midline shift. Moderate atrophy and diffuse white matter changes are similar to the prior exam. Atherosclerotic calcifications within cavernous internal carotid arteries. ORBITS: Bilateral lens replacement. SINUSES: No acute abnormality. SOFT TISSUES AND SKULL: No acute soft tissue abnormality. No skull fracture.  IMPRESSION: 1. No acute intracranial abnormality. 2. Moderate atrophy and diffuse white matter changes, similar to the prior exam. Electronically signed by: Lonni Necessary MD 07/13/2024 03:44 PM EST RP Workstation: [id number]    EKG: My personal interpretation of EKG shows: Pending   Assessment/Plan Principal Problem:   Sepsis (HCC) Active Problems:   Essential hypertension   Hyperlipidemia   Hypothyroidism   Paroxysmal atrial fibrillation (HCC)   CLL (chronic lymphocytic leukemia) (HCC)   Sick sinus syndrome (HCC)   Patient with acute encephalopathy likely secondary to sepsis.  Potential sources are respiratory versus UTI with UTI being more likely.  Blood cultures ordered.  Urine culture pending.  Patient given ceftriaxone  and doxycycline .  Chest x-ray pending but CT abdomen pelvis shows lower lung with right-sided pneumonia.  Given less respiratory symptoms my concern is more for UTI leading to her presentation.  She is covered for both currently.  Will follow cultures and de-escalate as able.  Lactic acid is mildly elevated but repeat is pending and patient is getting IVF.  Hypertension: Holding home medicines given sepsis.  Will continue to monitor.  Hyperlipidemia: Continue home Lipitor 20 mg.  Hypothyroidism: Continue home Synthroid .  TSH checked in the last 3 months and was normal.  Will defer rechecking TSH and acute sepsis.  CLL: Being monitored in the outpatient setting.  Continue outpatient monitoring of this.  CHF: Last echo shows EF at 40%.  Not on any diuretic therapy.  Will reduce her IVF to 75 cc an hour.  A-fib/sick sinus syndrome: Patient status post pacemaker.  She is on Xarelto .  He has intrinsic rate control.  Will continue to monitor.   VTE prophylaxis:  SQ Heparin   Diet: N.p.o. Code Status: DNR, full interventions, healthcare power of attorney is Oralia Criger per her advance directives. Telemetry:  Admission status: Inpatient, Progressive Patient is  from: Home Anticipated d/c is to: Home Anticipated d/c is in: 2-3 days   Family Communication: Updated at bedside  Consults called: None   Severity of Illness: The appropriate patient status for this patient is INPATIENT. Inpatient status is judged to be reasonable and necessary in order to provide the required intensity of service to ensure the patient's safety. The patient's presenting symptoms, physical exam findings, and initial radiographic and laboratory data in the context of their chronic comorbidities is felt to place them at high  risk for further clinical deterioration. Furthermore, it is not anticipated that the patient will be medically stable for discharge from the hospital within 2 midnights of admission.   * I certify that at the point of admission it is my clinical judgment that the patient will require inpatient hospital care spanning beyond 2 midnights from the point of admission due to high intensity of service, high risk for further deterioration and high frequency of surveillance required.DEWAINE Morene Bathe, MD [place]ol[place] DEL. [facility]     [1]  Allergies Allergen Reactions   Alphagan [Brimonidine] Itching and Rash   Pravachol [Pravastatin Sodium] Itching and Rash   "

## 2024-07-13 NOTE — ED Triage Notes (Signed)
 Pt to ED [facility] from home for tremors (more than usual, hx of same) and that pt is fearful of falling, holding onto them for help more than usual and slightly confused since last couple days. Per family when pt gets these symptoms she usually has hyponatremia. Glasses were kept by family at home so they don't get lost.  100mL NS, 20# L forearm, CBG 145, 12 lead paced rhythm hx afib, 133/68  Pt oriented except to time

## 2024-07-13 NOTE — ED Provider Notes (Signed)
 Care assumed of patient from outgoing provider.  See their note for initial history, exam and plan.  Clinical Course as of 07/13/24 1629  Sun Jul 13, 2024  1429 Reassessed and discussed urine results, and pending CT [DS]  1503 Home with family - mild metabolic encephalopathy, ? From UTI. CT head and CT a/p w cont pending. If w/u otherwise neg plan to dc home with family.  [SM]  1627 CT scan concerning for right lower lobe pneumonia.  Patient with ongoing pain to her lower abdomen and confusion.  Based on her curb 65 score -feel that the patient would be best served admitted to the hospital for IV antibiotics.  Continues to have pain so we will give a dose of IV fentanyl .  1 blood pressure that was in the chart is 69/58 that was incorrect, we will correct now.  Most recent blood pressure was 91/76.  Given new tachypnea and leukocytosis patient now meeting criteria for sepsis with concern for an infectious process of her right lower lobe pneumonia.  Adding on blood cultures and a lactic acid, adding on azithromycin to treat community-acquired pneumonia.  Consulted hospitalist for admission.  Given IV fentanyl  for pain control. [SM]    Clinical Course User Index [DS] Claudene Rover, MD [SM] Suzanne Kirsch, MD     Suzanne Kirsch, MD 07/13/24 1630

## 2024-07-13 NOTE — Progress Notes (Signed)
 Elink following for sepsis protocol.

## 2024-07-13 NOTE — Progress Notes (Signed)
 CODE SEPSIS - PHARMACY COMMUNICATION  **Broad Spectrum Antibiotics should be administered within 1 hour of Sepsis diagnosis**  Time Code Sepsis Called/Page Received: 1628  Antibiotics Ordered: ceftriaxone  and doxycycline   Time of 1st antibiotic administration: 1706  Additional action taken by pharmacy:   If necessary, Name of Provider/Nurse Contacted:     Bari Glendia BIRCH ,PharmD Clinical Pharmacist  07/13/2024  5:43 PM

## 2024-07-13 NOTE — ED Provider Notes (Signed)
 "  [facility] Provider Note    Event Date/Time   First MD Initiated Contact with Patient 07/13/24 1155     (approximate)   History   Altered Mental Status   HPI  Sydney Mcdaniel is a 89 y.o. female who presents to the ED for evaluation of Altered Mental Status   I reviewed a cardiology clinic visit from November.  History of CHF, A-fib, SSS s/p pacer, CLL, HTN, HLD.  Anticoagulated on Xarelto   Daughter and son-in-law bring patient to the ED for evaluation of confusion and depressed mental status over the past day.  Patient lives with them.  No acute events at home.  They are especially concerned about hyponatremia or UTI   Physical Exam   Triage Vital Signs: ED Triage Vitals  Encounter Vitals Group     BP --      Girls Systolic BP Percentile --      Girls Diastolic BP Percentile --      Boys Systolic BP Percentile --      Boys Diastolic BP Percentile --      Pulse Rate 07/13/24 1142 81     Resp 07/13/24 1142 20     Temp 07/13/24 1149 98.5 F (36.9 C)     Temp Source 07/13/24 1142 Oral     SpO2 07/13/24 1142 100 %     Weight 07/13/24 1144 160 lb (72.6 kg)     Height 07/13/24 1144 5' 2 (1.575 m)     Head Circumference --      Peak Flow --      Pain Score 07/13/24 1143 0     Pain Loc --      Pain Education --      Exclude from Growth Chart --     Most recent vital signs: Vitals:   07/13/24 1142 07/13/24 1149  Pulse: 81 78  Resp: 20   Temp:  98.5 F (36.9 C)  SpO2: 100%     General: Awake, no distress.  CV:  Good peripheral perfusion.  Resp:  Normal effort.  Abd:  No distention.  Lower abdominal tenderness, benign upper abdomen MSK:  No deformity noted.  Neuro:  No focal deficits appreciated. Other:     ED Results / Procedures / Treatments   Labs (all labs ordered are listed, but only abnormal results are displayed) Labs Reviewed  COMPREHENSIVE METABOLIC PANEL WITH GFR - Abnormal; Notable for the following components:       Result Value   Sodium 133 (*)    CO2 21 (*)    Glucose, Bld 105 (*)    All other components within normal limits  CBC - Abnormal; Notable for the following components:   WBC 17.4 (*)    RBC 3.78 (*)    All other components within normal limits  URINALYSIS, ROUTINE W REFLEX MICROSCOPIC - Abnormal; Notable for the following components:   Color, Urine YELLOW (*)    APPearance CLEAR (*)    Hgb urine dipstick SMALL (*)    Leukocytes,Ua MODERATE (*)    All other components within normal limits  URINE CULTURE  CBG MONITORING, ED    EKG A-fib with multiple PVCs, tremulous baseline clouds fine detail.  No clear STEMI  RADIOLOGY CTs pending  Official radiology report(s): No results found.  PROCEDURES and INTERVENTIONS:  Procedures  Medications  iohexol  (OMNIPAQUE ) 300 MG/ML solution 100 mL (has no administration in time range)  morphine  (PF) 2 MG/ML injection 2 mg (has  no administration in time range)  ondansetron  (ZOFRAN ) injection 4 mg (has no administration in time range)  cefTRIAXone  (ROCEPHIN ) 1 g in sodium chloride  0.9 % 100 mL IVPB (0 g Intravenous Stopped 07/13/24 1428)     IMPRESSION / MDM / ASSESSMENT AND PLAN / ED COURSE  I reviewed the triage vital signs and the nursing notes.  Differential diagnosis includes, but is not limited to, stroke, ICH, metabolic encephalopathy, hyponatremia, UTI, viral syndrome  {Patient presents with symptoms of an acute illness or injury that is potentially life-threatening.  Patient presents to the ED with nonfocal altered mentation likely metabolic encephalopathy in the setting of a UTI.  Normal vital signs.  Leukocytosis is noted but no other SIRS criteria.  Essentially normal sodium and metabolic panel, urine with infectious features alongside her confusion and suprapubic discomfort, sent for culture and she is started on ceftriaxone .  Awaiting CT head and abdomen/pelvis at the time of signout to oncoming physician.  If these are  reassuring without complicating or additional concerns then patient may be suitable for outpatient management with family and prescription for antibiotics.  Clinical Course as of 07/13/24 1448  [place] Jul 13, 2024  1429 Reassessed and discussed urine results, and pending CT [DS]    Clinical Course User Index [DS] Claudene Rover, MD     FINAL CLINICAL IMPRESSION(S) / ED DIAGNOSES   Final diagnoses:  None     Rx / DC Orders   ED Discharge Orders     None        Note:  This document was prepared using Dragon voice recognition software and may include unintentional dictation errors.   Claudene Rover, MD 07/13/24 1448  "

## 2024-07-13 NOTE — Progress Notes (Signed)
 PHARMACY NOTE:  ANTIMICROBIAL RENAL DOSAGE ADJUSTMENT  Current antimicrobial regimen includes a mismatch between antimicrobial dosage and estimated renal function.  As per policy approved by the Pharmacy & Therapeutics and Medical Executive Committees, the antimicrobial dosage will be adjusted accordingly.  Current antimicrobial dosage:  doxycycline  100 mg IV q24h  Indication: CAP  Renal Function:  Estimated Creatinine Clearance: 37.5 mL/min (by C-G formula based on SCr of 0.78 mg/dL). []      On intermittent HD, scheduled: []      On CRRT    Antimicrobial dosage has been changed to:  doxycycline  100 mg IV q12h  Additional comments:   Thank you for allowing pharmacy to be a part of this patient's care.  Lum DEL [place], [facility] 07/13/2024 10:17 PM

## 2024-07-14 DIAGNOSIS — G9341 Metabolic encephalopathy: Secondary | ICD-10-CM | POA: Diagnosis not present

## 2024-07-14 DIAGNOSIS — A419 Sepsis, unspecified organism: Secondary | ICD-10-CM | POA: Diagnosis not present

## 2024-07-14 DIAGNOSIS — R6520 Severe sepsis without septic shock: Secondary | ICD-10-CM | POA: Diagnosis not present

## 2024-07-14 LAB — CBC
HCT: 33.9 % — ABNORMAL LOW (ref 36.0–46.0)
Hemoglobin: 11.2 g/dL — ABNORMAL LOW (ref 12.0–15.0)
MCH: 33 pg (ref 26.0–34.0)
MCHC: 33 g/dL (ref 30.0–36.0)
MCV: 100 fL (ref 80.0–100.0)
Platelets: 131 K/uL — ABNORMAL LOW (ref 150–400)
RBC: 3.39 MIL/uL — ABNORMAL LOW (ref 3.87–5.11)
RDW: 13.2 % (ref 11.5–15.5)
WBC: 13.8 K/uL — ABNORMAL HIGH (ref 4.0–10.5)
nRBC: 0 % (ref 0.0–0.2)

## 2024-07-14 LAB — BASIC METABOLIC PANEL WITH GFR
Anion gap: 10 (ref 5–15)
BUN: 13 mg/dL (ref 8–23)
CO2: 22 mmol/L (ref 22–32)
Calcium: 8.7 mg/dL — ABNORMAL LOW (ref 8.9–10.3)
Chloride: 104 mmol/L (ref 98–111)
Creatinine, Ser: 0.75 mg/dL (ref 0.44–1.00)
GFR, Estimated: >60 mL/min
Glucose, Bld: 89 mg/dL (ref 70–99)
Potassium: 4.2 mmol/L (ref 3.5–5.1)
Sodium: 136 mmol/L (ref 135–145)

## 2024-07-14 LAB — CBG MONITORING, ED: Glucose-Capillary: 93 mg/dL (ref 70–99)

## 2024-07-14 LAB — RESPIRATORY PANEL BY PCR

## 2024-07-14 LAB — RESP PANEL BY RT-PCR (RSV, FLU A&B, COVID)  RVPGX2
Influenza A by PCR: NEGATIVE
Influenza B by PCR: NEGATIVE
Resp Syncytial Virus by PCR: NEGATIVE
SARS Coronavirus 2 by RT PCR: NEGATIVE

## 2024-07-14 LAB — LACTIC ACID, PLASMA: Lactic Acid, Venous: 1.2 mmol/L (ref 0.5–1.9)

## 2024-07-14 LAB — PROCALCITONIN: Procalcitonin: 1.04 ng/mL

## 2024-07-14 MED ORDER — LACTATED RINGERS IV SOLN: INTRAVENOUS | Status: AC

## 2024-07-14 MED ORDER — DORZOLAMIDE HCL 2 % OP SOLN
1.0000 [drp] | Freq: Two times a day (BID) | OPHTHALMIC | Status: DC
  Administered 2024-07-14: 1 [drp] via OPHTHALMIC
  Filled 2024-07-14 (×2): qty 10

## 2024-07-14 MED ORDER — ATORVASTATIN CALCIUM 20 MG PO TABS
20.0000 mg | ORAL_TABLET | Freq: Every evening | ORAL | Status: DC
  Administered 2024-07-14: 20 mg via ORAL
  Filled 2024-07-14: qty 1

## 2024-07-14 MED ORDER — SODIUM CHLORIDE 1 G PO TABS
1.0000 g | ORAL_TABLET | Freq: Two times a day (BID) | ORAL | Status: DC
  Administered 2024-07-14 – 2024-07-15 (×2): 1 g via ORAL
  Filled 2024-07-14 (×2): qty 1

## 2024-07-14 MED ORDER — LEVOTHYROXINE SODIUM 50 MCG PO TABS
75.0000 ug | ORAL_TABLET | Freq: Every day | ORAL | Status: DC
  Administered 2024-07-14 – 2024-07-15 (×2): 75 ug via ORAL
  Filled 2024-07-14: qty 1
  Filled 2024-07-14: qty 2

## 2024-07-14 MED ORDER — LATANOPROST 0.005 % OP SOLN
1.0000 [drp] | Freq: Every day | OPHTHALMIC | Status: DC
  Administered 2024-07-14: 1 [drp] via OPHTHALMIC
  Filled 2024-07-14: qty 2.5

## 2024-07-14 NOTE — Evaluation (Signed)
 Physical Therapy Evaluation Patient Details Name: SAACHI ZALE MRN: [id number] DOB: 1925-02-18 Today's Date: 07/14/2024  History of Present Illness  89 y.o. year old female presenting to the ED with alteration in mental status, coughing and frequent urination. Current MD assessment: acute encephalopathy likely secondary to sepsis and UTI and concern for pneumonia versus pneumonitis. PMH of hypertension, hyperlipidemia, hypothyroidism, paroxysmal atrial fibrillation, sick sinus syndrome status post pacemaker in 2023, CLL  Clinical Impression  Patient noted to be in supine position at PT arrival in room, for an initial PT evaluation due to a decline in functional status, with baseline mobility reported as needing, and currently requiring min/CGA for hallway ambulation. Presenting with good willingness to work with PT. The patient resides in a house and lives with children with family/friend support. There are no STE inside the residence.  Vitals are stable with an SpO? of >90% on RA. Gait was assessed with RW. Gait mechanic observations noted shuffling. The overall clinical impression is that the patient presents with mild mobility limitations. Recommended skilled PT will address safety, mobility, and discharge planning.        If plan is discharge home, recommend the following: A little help with walking and/or transfers;A little help with bathing/dressing/bathroom;Help with stairs or ramp for entrance;Assist for transportation;Assistance with cooking/housework;Direct supervision/assist for financial management;Direct supervision/assist for medications management   Can travel by private vehicle        Equipment Recommendations Rolling walker (2 wheels)  Recommendations for Other Services       Functional Status Assessment Patient has had a recent decline in their functional status and demonstrates the ability to make significant improvements in function in a reasonable and predictable  amount of time.     Precautions / Restrictions Restrictions Weight Bearing Restrictions Per Provider Order: No      Mobility  Bed Mobility Overal bed mobility: Needs Assistance Bed Mobility: Supine to Sit, Sit to Supine     Supine to sit: Min assist, Mod assist Sit to supine: Min assist        Transfers Overall transfer level: Needs assistance Equipment used: Rolling walker (2 wheels) Transfers: Sit to/from Stand Sit to Stand: Min assist, Contact guard assist           General transfer comment: minA initially progressing to close contact gaurd to light contact; able to stand for line, lead and cord management at bedside    Ambulation/Gait Ambulation/Gait assistance: Min assist, Contact guard assist Gait Distance (Feet): 70 Feet Assistive device: Rolling walker (2 wheels) Gait Pattern/deviations: Step-through pattern, Staggering left, Narrow base of support, Trunk flexed, Shuffle Gait velocity: decresaed     General Gait Details: drifts left due to visual acuity/impairment on R side; unsteady initially and improved with progressive gait  Stairs            Wheelchair Mobility     Tilt Bed    Modified Rankin (Stroke Patients Only)       Balance Overall balance assessment: Needs assistance   Sitting balance-Leahy Scale: Good     Standing balance support: During functional activity, Reliant on assistive device for balance Standing balance-Leahy Scale: Fair                               Pertinent Vitals/Pain      Home Living Family/patient expects to be discharged to:: Private residence Living Arrangements: Children (daughter and son in law) Available Help at Discharge: Family;Available  24 hours/day Type of Home: House Home Access: Ramped entrance       Home Layout: One level Home Equipment: Rollator (4 wheels);Shower seat;Hospital bed;Grab bars - toilet Additional Comments: sleeps in her regular bed    Prior Function Prior  Level of Function : Independent/Modified Independent             Mobility Comments: sup/MOD I with rollator household distances; denies recent falls; gets hair done every Friday and goes to church on Sundays; occasionally goes out to eat ADLs Comments: lives with her daughter and son in social worker; typically supv/mod i with toileting tasks, dressing, assist for showers     Extremity/Trunk Assessment   Upper Extremity Assessment Upper Extremity Assessment: Defer to OT evaluation    Lower Extremity Assessment Lower Extremity Assessment: Generalized weakness    Cervical / Trunk Assessment Cervical / Trunk Assessment: Kyphotic  Communication   Communication Communication: No apparent difficulties    Cognition Arousal: Alert Behavior During Therapy: WFL for tasks assessed/performed   PT - Cognitive impairments: History of cognitive impairments, Memory                         Following commands: Intact       Cueing Cueing Techniques: Verbal cues, Tactile cues     General Comments      Exercises     Assessment/Plan    PT Assessment Patient needs continued PT services  PT Problem List Decreased range of motion;Decreased activity tolerance;Decreased balance;Decreased mobility       PT Treatment Interventions Gait training;Stair training;Functional mobility training;Therapeutic activities;Therapeutic exercise;Balance training;Patient/family education;Neuromuscular re-education    PT Goals (Current goals can be found in the Care Plan section)  Acute Rehab PT Goals Patient Stated Goal: Pt wants to go home PT Goal Formulation: With patient Time For Goal Achievement: 08/11/24 Potential to Achieve Goals: Good    Frequency Min 2X/week     Co-evaluation PT/OT/SLP Co-Evaluation/Treatment: Yes Reason for Co-Treatment: For patient/therapist safety;To address functional/ADL transfers PT goals addressed during session: Mobility/safety with mobility OT goals  addressed during session: ADL's and self-care       AM-PAC PT 6 Clicks Mobility  Outcome Measure Help needed turning from your back to your side while in a flat bed without using bedrails?: None Help needed moving from lying on your back to sitting on the side of a flat bed without using bedrails?: None Help needed moving to and from a bed to a chair (including a wheelchair)?: None Help needed standing up from a chair using your arms (e.g., wheelchair or bedside chair)?: A Little Help needed to walk in hospital room?: A Little Help needed climbing 3-5 steps with a railing? : A Lot 6 Click Score: 20    End of Session Equipment Utilized During Treatment: Gait belt Activity Tolerance: Patient tolerated treatment well Patient left: in bed;with call bell/phone within reach;with bed alarm set;with family/visitor present Nurse Communication: Mobility status PT Visit Diagnosis: Other abnormalities of gait and mobility (R26.89);Muscle weakness (generalized) (M62.81);Difficulty in walking, not elsewhere classified (R26.2)    Time: 8943-8877 PT Time Calculation (min) (ACUTE ONLY): 26 min   Charges:   PT Evaluation $PT Eval Low Complexity: 1 Low   PT General Charges $$ ACUTE PT VISIT: 1 Visit         Sherlean Lesches DPT, PT   Hanh Kertesz A Giana Castner 07/14/2024, 1:21 PM

## 2024-07-14 NOTE — Progress Notes (Signed)
 " PROGRESS NOTE    Sydney Mcdaniel  FMW:[id number] DOB: 01/07/1925 DOA: 07/13/2024 PCP: Bernardo Fend, DO    Brief Narrative:   Sydney Mcdaniel is a 89 y.o. year old female with medical history of hypertension, hyperlipidemia, hypothyroidism, paroxysmal atrial fibrillation, sick sinus syndrome status post pacemaker in 2023, CLL presenting to the ED with alteration in mental status since yesterday.  Family at bedside stating patient has been having some coughing since yesterday.  She has also been having frequent urination.  Denied any sick contacts.  Patient unable to answer orientation questions other than her name.  Per family she is looking better than earlier today.  On arrival to the ED patient was noted to be HDS stable.  Lab work and imaging obtained.  CBC with significant leukocytosis at 17.4, normal hemoglobin.  CMP with mild hyponatremia, non-anion gap metabolic acidosis-mild.  UA consistent with UTI.  Code sepsis initiated on arrival and lactic acid mildly elevated at 2.1.  CT abdomen pelvis obtained with contrast showed an opacity in the right lower lobe concerning for pneumonia versus pneumonitis but otherwise no acute findings.  CT head without any acute findings.  Given need for further care, [facility] contacted for admission.    Assessment & Plan:   Principal Problem:   Sepsis (HCC) Active Problems:   Essential hypertension   Hyperlipidemia   Hypothyroidism   Paroxysmal atrial fibrillation (HCC)   CLL (chronic lymphocytic leukemia) (HCC)   Chronic diastolic congestive heart failure (HCC)   MCI (mild cognitive impairment)   Sick sinus syndrome (HCC)   Pacemaker - Biotronik  # CAP Here with several days cough and infiltrate seen on CT/CXR, with leukocytosis and elevated procal. Covid/flu/rsv neg, no additional w/u ordered. Improving. - continue ceftriaxone /doxy - check rvp, urine antigens  # Hypoxia? On 2 liters but don't see any documented hypoxia - wean as able  #  Acute encephalopathy Likely 2/2 infection. Much improved this morning - treat underlying cause - delirium precautions  # Sepsis By leukocytosis, tachypnea. Improving - abx and fluids, continue  # Acute cystitis? Urinalysis equivocal, did present with one day increased frequency. CT abdomen/pelvis nothing acute.  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 - gentle fluids as above - holding home spiro  # A-fib Rate controlled - continue home xarelto   # Hypothyroid - home synthroid   # SIADH Sodium wnl - cont home salt tabs  DVT prophylaxis: n/a (therapeutic anticoagulation) Code Status: dnr/dni Family Communication: daughter updated @ bedside 1/19  Level of care: Telemetry Status is: Inpatient    Consultants:  none  Procedures: none  Antimicrobials:  See above    Subjective:  Mild cough, no pain, hungry  Objective: Vitals:   07/14/24 0632 07/14/24 0800 07/14/24 0905 07/14/24 0930  BP: (!) 124/58 (!) 110/47 107/80 (!) 119/43  Pulse: 70 76 75 74  Resp: 14 12 14 15   Temp: 97.9 F (36.6 C)     TempSrc: Oral     SpO2: 97% 98% 100% 100%  Weight:      Height:       No intake or output data in the 24 hours ending 07/14/24 1010 Filed Weights   07/13/24 1144  Weight: 72.6 kg    Examination:  General  exam: Appears calm and comfortable  Respiratory system: few scattered rales Cardiovascular system: S1 & S2 heard, RRR.   Gastrointestinal system: Abdomen is nondistended, soft and nontender.   Central nervous system: Alert and oriented to self, place. Moving all 4 Extremities: Symmetric 5 x 5 power. Mild LE edema Skin: No rashes, lesions or ulcers Psychiatry: calm    Data Reviewed: I have personally reviewed following  labs and imaging studies  CBC: Recent Labs  Lab 07/13/24 1200 07/14/24 0416  WBC 17.4* 13.8*  HGB 12.6 11.2*  HCT 37.1 33.9*  MCV 98.1 100.0  PLT 185 131*   Basic Metabolic Panel: Recent Labs  Lab 07/13/24 1200 07/14/24 0416  NA 133* 136  K 4.1 4.2  CL 101 104  CO2 21* 22  GLUCOSE 105* 89  BUN 16 13  CREATININE 0.78 0.75  CALCIUM  9.3 8.7*  MG 1.9  --    GFR: Estimated Creatinine Clearance: 37.5 mL/min (by C-G formula based on SCr of 0.75 mg/dL). Liver Function Tests: Recent Labs  Lab 07/13/24 1200  AST 26  ALT 11  ALKPHOS 40  BILITOT 0.8  PROT 6.7  ALBUMIN 3.6   No results for input(s): LIPASE, AMYLASE in the last 168 hours. No results for input(s): AMMONIA in the last 168 hours. Coagulation Profile: No results for input(s): INR, PROTIME in the last 168 hours. Cardiac Enzymes: No results for input(s): CKTOTAL, CKMB, CKMBINDEX, TROPONINI in the last 168 hours. BNP (last 3 results) No results for input(s): PROBNP in the last 8760 hours. HbA1C: No results for input(s): HGBA1C in the last 72 hours. CBG: Recent Labs  Lab 07/14/24 0751  GLUCAP 93   Lipid Profile: No results for input(s): CHOL, HDL, LDLCALC, TRIG, CHOLHDL, LDLDIRECT in the last 72 hours. Thyroid  Function Tests: No results for input(s): TSH, T4TOTAL, FREET4, T3FREE, THYROIDAB in the last 72 hours. Anemia Panel: No results for input(s): VITAMINB12, FOLATE, FERRITIN, TIBC, IRON, RETICCTPCT in the last 72 hours. Urine analysis:    Component Value Date/Time   COLORURINE YELLOW (A) 07/13/2024 1350   APPEARANCEUR CLEAR (A) 07/13/2024 1350   LABSPEC 1.012 07/13/2024 1350   PHURINE 6.0 07/13/2024 1350   GLUCOSEU NEGATIVE 07/13/2024 1350   HGBUR SMALL (A) 07/13/2024 1350   BILIRUBINUR NEGATIVE 07/13/2024 1350   BILIRUBINUR negative 05/16/2024 1034   KETONESUR NEGATIVE 07/13/2024 1350   PROTEINUR NEGATIVE 07/13/2024 1350   UROBILINOGEN  0.2 05/16/2024 1034   NITRITE NEGATIVE 07/13/2024 1350   LEUKOCYTESUR MODERATE (A) 07/13/2024 1350   Sepsis Labs: @LABRCNTIP (procalcitonin:4,lacticidven:4)  ) Recent Results (from the past 240 hours)  Culture, blood (single)     Status: None (Preliminary result)   Collection Time: 07/13/24  4:45 PM   Specimen: BLOOD  Result Value Ref Range Status   Specimen Description BLOOD RIGHT ANTECUBITAL  Final   Special Requests   Final    BOTTLES DRAWN AEROBIC AND ANAEROBIC Blood Culture adequate volume   Culture   Final    NO GROWTH < 24 HOURS Performed at [facility], [address] [place], [place] [address]    Report Status PENDING  Incomplete  Blood culture (routine x 2)     Status: None (Preliminary result)   Collection Time: 07/13/24  4:45 PM   Specimen: BLOOD  Result Value Ref Range Status   Specimen Description BLOOD BLOOD LEFT WRIST  Final   Special Requests   Final    BOTTLES DRAWN AEROBIC AND ANAEROBIC Blood Culture adequate volume   Culture  Final    NO GROWTH < 24 HOURS Performed at [facility], [address] [place], [place] [address]    Report Status PENDING  Incomplete  Resp panel by RT-PCR (RSV, Flu A&B, Covid) Anterior Nasal Swab     Status: None   Collection Time: 07/13/24 11:35 PM   Specimen: Anterior Nasal Swab  Result Value Ref Range Status   SARS Coronavirus 2 by RT PCR NEGATIVE NEGATIVE Final    Comment: (NOTE) SARS-CoV-2 target nucleic acids are NOT DETECTED.  The SARS-CoV-2 RNA is generally detectable in upper respiratory specimens during the acute phase of infection. The lowest concentration of SARS-CoV-2 viral copies this assay can detect is 138 copies/mL. A negative result does not preclude SARS-Cov-2 infection and should not be used as the sole basis for treatment or other patient management decisions. A negative result may occur with  improper specimen collection/handling, submission of specimen other than nasopharyngeal  swab, presence of viral mutation(s) within the areas targeted by this assay, and inadequate number of viral copies(<138 copies/mL). A negative result must be combined with clinical observations, patient history, and epidemiological information. The expected result is Negative.  Fact Sheet for Patients:  bloggercourse.com  Fact Sheet for Healthcare Providers:  seriousbroker.it  This test is no t yet approved or cleared by the [place]  FDA and  has been authorized for detection and/or diagnosis of SARS-CoV-2 by FDA under an Emergency Use Authorization (EUA). This EUA will remain  in effect (meaning this test can be used) for the duration of the COVID-19 declaration under Section 564(b)(1) of the Act, 21 U.S.C.section 360bbb-3(b)(1), unless the authorization is terminated  or revoked sooner.       Influenza A by PCR NEGATIVE NEGATIVE Final   Influenza B by PCR NEGATIVE NEGATIVE Final    Comment: (NOTE) The Xpert Xpress SARS-CoV-2/FLU/RSV plus assay is intended as an aid in the diagnosis of influenza from Nasopharyngeal swab specimens and should not be used as a sole basis for treatment. Nasal washings and aspirates are unacceptable for Xpert Xpress SARS-CoV-2/FLU/RSV testing.  Fact Sheet for Patients: bloggercourse.com  Fact Sheet for Healthcare Providers: seriousbroker.it  This test is not yet approved or cleared by the [place]  FDA and has been authorized for detection and/or diagnosis of SARS-CoV-2 by FDA under an Emergency Use Authorization (EUA). This EUA will remain in effect (meaning this test can be used) for the duration of the COVID-19 declaration under Section 564(b)(1) of the Act, 21 U.S.C. section 360bbb-3(b)(1), unless the authorization is terminated or revoked.     Resp Syncytial Virus by PCR NEGATIVE NEGATIVE Final    Comment: (NOTE) Fact Sheet for  Patients: bloggercourse.com  Fact Sheet for Healthcare Providers: seriousbroker.it  This test is not yet approved or cleared by the [place]  FDA and has been authorized for detection and/or diagnosis of SARS-CoV-2 by FDA under an Emergency Use Authorization (EUA). This EUA will remain in effect (meaning this test can be used) for the duration of the COVID-19 declaration under Section 564(b)(1) of the Act, 21 U.S.C. section 360bbb-3(b)(1), unless the authorization is terminated or revoked.  Performed at [facility], [address], [place], [place] [address]          Radiology Studies: DG Chest [place] 1 View Result Date: 07/13/2024 EXAM: 1 VIEW(S) XRAY OF THE CHEST 07/13/2024 06:01:19 PM COMPARISON: None available. CLINICAL HISTORY: Sepsis (HCC) FINDINGS: LINES, TUBES AND DEVICES: Dual lead pacemaker leads are present and appear to be in appropriate  position. LUNGS AND PLEURA: Right perihilar airspace opacities. No pleural effusion. No pneumothorax. HEART AND MEDIASTINUM: Atherosclerotic calcifications. No acute abnormality of the cardiac and mediastinal silhouettes. BONES AND SOFT TISSUES: Left chest wall dual lead pacemaker generator. No acute osseous abnormality. IMPRESSION: 1. Right perihilar airspace opacities, concerning for pneumonia. Follow-up x-ray recommended in 4 to 6 weeks to confirm resolution. If there is high clinical concern for hilar pathology, recommend CT. 2. Left chest wall dual lead pacemaker. Electronically signed by: Greig Pique MD 07/13/2024 06:17 PM EST RP Workstation: [id number]   CT ABDOMEN PELVIS W CONTRAST Result Date: 07/13/2024 EXAM: CT ABDOMEN AND PELVIS WITH CONTRAST 07/13/2024 03:08:26 PM TECHNIQUE: CT of the abdomen and pelvis was performed with the administration of 100 mL Omnipaque  contrast. Multiplanar reformatted images are provided for review. Automated exposure control, iterative  reconstruction, and/or weight-based adjustment of the mA/kV was utilized to reduce the radiation dose to as low as reasonably achievable. COMPARISON: None available. CLINICAL HISTORY: altered, leukocytosis, lower abd pain FINDINGS: LOWER CHEST: There is nodular consolidation in the medial aspect of the right lower lobe consistent with pneumonia versus aspiration pneumonitis. LIVER: The liver is unremarkable. GALLBLADDER AND BILE DUCTS: Cholecystectomy. SPLEEN: No acute abnormality. PANCREAS: No acute abnormality. ADRENAL GLANDS: No acute abnormality. KIDNEYS, URETERS AND BLADDER: No stones in the kidneys or ureters. No hydronephrosis. No perinephric or periureteral stranding. The bladder is moderately distended. Small amount of gas in the bladder presumably related to instrumentation. GI AND BOWEL: Stomach demonstrates no acute abnormality. No small bowel obstruction or inflammation. Appendix is not identified. Colon is normal. PERITONEUM AND RETROPERITONEUM: No ascites. No free air. VASCULATURE: Aorta is normal in caliber. LYMPH NODES: No lymphadenopathy. REPRODUCTIVE ORGANS: No acute abnormality. BONES AND SOFT TISSUES: No acute osseous abnormality. No focal soft tissue abnormality. IMPRESSION: 1. Nodular consolidation in the medial aspect of the right lower lobe, consistent with pneumonia versus aspiration pneumonitis. 2.  No bowel obstruction or inflammatory process seen. 3. Bladder distention. Electronically signed by: Norleen Boxer MD 07/13/2024 04:05 PM EST RP Workstation: [id number]   CT HEAD WO CONTRAST ( ) Result Date: 07/13/2024 EXAM: CT HEAD WITHOUT CONTRAST 07/13/2024 03:08:26 PM TECHNIQUE: CT of the head was performed without the administration of intravenous contrast. Automated exposure control, iterative reconstruction, and/or weight based adjustment of the mA/kV was utilized to reduce the radiation dose to as low as reasonably achievable. COMPARISON: Comparison is 87:23. CLINICAL HISTORY:  nonfocal altered FINDINGS: BRAIN AND VENTRICLES: No acute hemorrhage. No evidence of acute infarct. No hydrocephalus. No extra-axial collection. No mass effect or midline shift. Moderate atrophy and diffuse white matter changes are similar to the prior exam. Atherosclerotic calcifications within cavernous internal carotid arteries. ORBITS: Bilateral lens replacement. SINUSES: No acute abnormality. SOFT TISSUES AND SKULL: No acute soft tissue abnormality. No skull fracture. IMPRESSION: 1. No acute intracranial abnormality. 2. Moderate atrophy and diffuse white matter changes, similar to the prior exam. Electronically signed by: Lonni Necessary MD 07/13/2024 03:44 PM EST RP Workstation: [id number]        Scheduled Meds:  atorvastatin   20 mg Oral QPM   dorzolamide   1 drop Right Eye BID   latanoprost   1 drop Right Eye QHS   levothyroxine   75 mcg Oral Q0600   rivaroxaban   15 mg Oral Daily   sodium chloride  flush  3 mL Intravenous Q12H   sodium chloride   1 g Oral BID WC   Continuous Infusions:  cefTRIAXone  (ROCEPHIN )  IV     doxycycline  (VIBRAMYCIN ) IV  lactated ringers        LOS: 1 day     Devaughn KATHEE Ban, MD [facility]   If 7PM-7AM, please contact night-coverage [email or web address] Password [email or web address] 07/14/2024, 10:10 AM     "

## 2024-07-14 NOTE — ED Notes (Signed)
 Fall risk bundle in place.

## 2024-07-14 NOTE — Evaluation (Signed)
 Occupational Therapy Evaluation Patient Details Name: Sydney Mcdaniel MRN: [id number] DOB: 1924-11-04 Today's Date: 07/14/2024   History of Present Illness   89 y.o. year old female presenting to the ED with alteration in mental status, coughing and frequent urination. Current MD assessment: acute encephalopathy likely secondary to sepsis and UTI and concern for pneumonia versus pneumonitis. PMH of hypertension, hyperlipidemia, hypothyroidism, paroxysmal atrial fibrillation, sick sinus syndrome status post pacemaker in 2023, CLL     Clinical Impressions Pt was seen for OT evaluation this date. PTA, pt lives at home with her daughter and son in law. She is typically a household and limited orthoptist. Mod I/supervision with mobility and ADLs, does have assist from daughter for showers. Pt presents with deficits in strength, balance, and activity tolerance limiting their ability to perform ADL management at baseline level. Pt currently requires Min A for bed mobility with increased time/effort. Mod A for UB dressing to donn gown over shirt/lines/leads. Min A progressing to CGA for STS and ambulation with MIN A for RW management for obstacle maneuvering d/t poor vision and not having her glasses. Pt was able to be weaned to RA throughout session with sp02 92-98%.Will follow acutely for OT services to prevent further decline. Do anticipate the need for follow up OT services upon acute hospital DC.      If plan is discharge home, recommend the following:   A little help with walking and/or transfers;A little help with bathing/dressing/bathroom;A lot of help with bathing/dressing/bathroom;Assistance with cooking/housework;Help with stairs or ramp for entrance     Functional Status Assessment   Patient has had a recent decline in their functional status and demonstrates the ability to make significant improvements in function in a reasonable and predictable amount of  time.     Equipment Recommendations   None recommended by OT     Recommendations for Other Services         Precautions/Restrictions   Precautions Precautions: Fall Recall of Precautions/Restrictions: Impaired Restrictions Weight Bearing Restrictions Per Provider Order: No     Mobility Bed Mobility Overal bed mobility: Needs Assistance Bed Mobility: Supine to Sit, Sit to Supine     Supine to sit: Min assist, Used rails, HOB elevated Sit to supine: Min assist, Used rails   General bed mobility comments: increased time and assist, BLE management to return to supine    Transfers Overall transfer level: Needs assistance Equipment used: Rolling walker (2 wheels) Transfers: Sit to/from Stand Sit to Stand: Min assist, Contact guard assist           General transfer comment: increased time to reach stand progressing from Min to CGA, assist for RW management d/t poor vision and not having her glasses      Balance Overall balance assessment: Needs assistance   Sitting balance-Leahy Scale: Good     Standing balance support: During functional activity, Reliant on assistive device for balance Standing balance-Leahy Scale: Fair Standing balance comment: RW                           ADL either performed or assessed with clinical judgement   ADL Overall ADL's : Needs assistance/impaired                 Upper Body Dressing : Minimal assistance;Sitting Upper Body Dressing Details (indicate cue type and reason): seated EOB  Functional mobility during ADLs: Contact guard assist;Minimal assistance;Rolling walker (2 wheels)       Vision Baseline Vision/History: 1 Wears glasses Ability to See in Adequate Light: 1 Impaired Additional Comments: blind in one eye, does not have her glasses here     Perception         Praxis         Pertinent Vitals/Pain Pain Assessment Pain Assessment: No/denies pain      Extremity/Trunk Assessment Upper Extremity Assessment Upper Extremity Assessment: Overall WFL for tasks assessed;Generalized weakness   Lower Extremity Assessment Lower Extremity Assessment: Generalized weakness   Cervical / Trunk Assessment Cervical / Trunk Assessment: Kyphotic   Communication Communication Communication: No apparent difficulties   Cognition Arousal: Alert Behavior During Therapy: WFL for tasks assessed/performed                                 Following commands: Impaired Following commands impaired: Follows one step commands with increased time     Cueing  General Comments   Cueing Techniques: Verbal cues;Tactile cues  VSS on RA during session 92-98% sp02 on RA   Exercises Other Exercises Other Exercises: Edu on role of OT in acute setting and DC recommendations.   Shoulder Instructions      Home Living Family/patient expects to be discharged to:: Private residence Living Arrangements: Children (daughter and son in law) Available Help at Discharge: Family;Available 24 hours/day Type of Home: House Home Access: Ramped entrance     Home Layout: One level     Bathroom Shower/Tub: Producer, Television/film/video: Standard (with bars beside)     Home Equipment: Rollator (4 wheels);Shower seat;Hospital bed;Grab bars - toilet   Additional Comments: sleeps in her regular bed      Prior Functioning/Environment Prior Level of Function : Independent/Modified Independent             Mobility Comments: sup/MOD I with rollator household distances; denies recent falls; gets hair done every Friday and goes to church on Sundays; occasionally goes out to eat ADLs Comments: lives with her daughter and son in law; typically supv/mod i with toileting tasks, dressing, assist for showers    OT Problem List: Decreased strength;Decreased activity tolerance;Impaired balance (sitting and/or standing)   OT Treatment/Interventions:  Self-care/ADL training;Therapeutic exercise;Therapeutic activities;Patient/family education;DME and/or AE instruction;Balance training      OT Goals(Current goals can be found in the care plan section)   Acute Rehab OT Goals Patient Stated Goal: get better OT Goal Formulation: With patient/family Time For Goal Achievement: 07/28/24 Potential to Achieve Goals: Good ADL Goals Pt Will Perform Upper Body Bathing: with set-up;sitting Pt Will Perform Lower Body Dressing: with contact guard assist;sitting/lateral leans;sit to/from stand Pt Will Transfer to Toilet: with modified independence;with supervision;ambulating Pt Will Perform Toileting - Clothing Manipulation and hygiene: with modified independence;with supervision;sit to/from stand;sitting/lateral leans   OT Frequency:  Min 2X/week    Co-evaluation   Reason for Co-Treatment: For patient/therapist safety;To address functional/ADL transfers PT goals addressed during session: Mobility/safety with mobility OT goals addressed during session: ADL's and self-care      AM-PAC OT 6 Clicks Daily Activity     Outcome Measure Help from another person eating meals?: None Help from another person taking care of personal grooming?: A Little Help from another person toileting, which includes using toliet, bedpan, or urinal?: A Little Help from another person bathing (including washing, rinsing, drying)?: A Lot Help  from another person to put on and taking off regular upper body clothing?: A Little Help from another person to put on and taking off regular lower body clothing?: A Little 6 Click Score: 18   End of Session Equipment Utilized During Treatment: Rolling walker (2 wheels) Nurse Communication: Mobility status  Activity Tolerance: Patient tolerated treatment well Patient left: with call bell/phone within reach;in bed;with bed alarm set;with family/visitor present  OT Visit Diagnosis: Other abnormalities of gait and mobility  (R26.89);Muscle weakness (generalized) (M62.81)                Time: 1050-1120 OT Time Calculation (min): 30 min Charges:  OT General Charges $OT Visit: 1 Visit OT Evaluation $OT Eval Moderate Complexity: 1 Mod  Ainsley Deakins Chrismon, OTR/L  07/14/24, 1:47 PM   Reah Justo E Chrismon 07/14/2024, 1:42 PM

## 2024-07-15 ENCOUNTER — Encounter: Payer: Self-pay | Admitting: Hematology and Oncology

## 2024-07-15 ENCOUNTER — Other Ambulatory Visit: Payer: Self-pay

## 2024-07-15 DIAGNOSIS — J189 Pneumonia, unspecified organism: Secondary | ICD-10-CM

## 2024-07-15 LAB — BASIC METABOLIC PANEL WITH GFR
Anion gap: 9 (ref 5–15)
BUN: 15 mg/dL (ref 8–23)
CO2: 22 mmol/L (ref 22–32)
Calcium: 9 mg/dL (ref 8.9–10.3)
Chloride: 107 mmol/L (ref 98–111)
Creatinine, Ser: 0.82 mg/dL (ref 0.44–1.00)
GFR, Estimated: >60 mL/min
Glucose, Bld: 115 mg/dL — ABNORMAL HIGH (ref 70–99)
Potassium: 4.1 mmol/L (ref 3.5–5.1)
Sodium: 138 mmol/L (ref 135–145)

## 2024-07-15 LAB — GLUCOSE, CAPILLARY: Glucose-Capillary: 146 mg/dL — ABNORMAL HIGH (ref 70–99)

## 2024-07-15 LAB — URINE CULTURE

## 2024-07-15 LAB — CBC
HCT: 31.2 % — ABNORMAL LOW (ref 36.0–46.0)
Hemoglobin: 10.4 g/dL — ABNORMAL LOW (ref 12.0–15.0)
MCH: 32.6 pg (ref 26.0–34.0)
MCHC: 33.3 g/dL (ref 30.0–36.0)
MCV: 97.8 fL (ref 80.0–100.0)
Platelets: 127 K/uL — ABNORMAL LOW (ref 150–400)
RBC: 3.19 MIL/uL — ABNORMAL LOW (ref 3.87–5.11)
RDW: 13.2 % (ref 11.5–15.5)
WBC: 7.8 K/uL (ref 4.0–10.5)
nRBC: 0 % (ref 0.0–0.2)

## 2024-07-15 LAB — STREP PNEUMONIAE URINARY ANTIGEN: Strep Pneumo Urinary Antigen: NEGATIVE

## 2024-07-15 MED ORDER — DOXYCYCLINE HYCLATE 100 MG PO CAPS
100.0000 mg | ORAL_CAPSULE | Freq: Two times a day (BID) | ORAL | 0 refills | Status: AC
  Filled 2024-07-15: qty 6, 3d supply, fill #0

## 2024-07-15 MED ORDER — CEFPODOXIME PROXETIL 200 MG PO TABS
200.0000 mg | ORAL_TABLET | Freq: Two times a day (BID) | ORAL | 0 refills | Status: AC
  Filled 2024-07-15: qty 6, 3d supply, fill #0

## 2024-07-15 NOTE — Discharge Summary (Signed)
 Sydney Mcdaniel FMW:[id number] DOB: 10-30-1924 DOA: 07/13/2024  PCP: Bernardo Fend, DO  Admit date: 07/13/2024 Discharge date: 07/15/2024  Time spent: 35 minutes  Recommendations for Outpatient Follow-up:  Pcp f/u 1 week, consider cbc to confirm resolution of thrombocytopenia    Discharge Diagnoses:  Principal Problem:   Sepsis (HCC) Active Problems:   Essential hypertension   Hyperlipidemia   Hypothyroidism   Paroxysmal atrial fibrillation (HCC)   CLL (chronic lymphocytic leukemia) (HCC)   Chronic diastolic congestive heart failure (HCC)   MCI (mild cognitive impairment)   Sick sinus syndrome (HCC)   Pacemaker - Biotronik   Discharge Condition: improved  Diet recommendation: heart healthy  Filed Weights   07/13/24 1144 07/15/24 0358  Weight: 72.6 kg 66.2 kg    History of present illness:  From admission h and p Expand All Collapse All   History and Physical      Sydney Mcdaniel FMW:[id number] DOB: 06/06/1925 DOA: 07/13/2024   DOS: the patient was seen and examined on 07/13/2024   PCP: Bernardo Fend, DO    Patient coming from: Home   I have personally briefly reviewed patient's old medical records in [facility] and CareEverywhere   HPI:    SHAKENA CALLARI is a 89 y.o. year old female with medical history of hypertension, hyperlipidemia, hypothyroidism, paroxysmal atrial fibrillation, sick sinus syndrome status post pacemaker in 2023, CLL presenting to the ED with alteration in mental status since yesterday.  Family at bedside stating patient has been having some coughing since yesterday.  She has also been having frequent urination.  Denied any sick contacts.  Patient unable to answer orientation questions other than her name.  Per family she is looking better than earlier today.  On arrival to the ED patient was noted to be HDS stable.  Lab work and imaging obtained.  CBC with significant leukocytosis at 17.4, normal hemoglobin.  CMP with mild  hyponatremia, non-anion gap metabolic acidosis-mild.  UA consistent with UTI.  Code sepsis initiated on arrival and lactic acid mildly elevated at 2.1.  CT abdomen pelvis obtained with contrast showed an opacity in the right lower lobe concerning for pneumonia versus pneumonitis but otherwise no acute findings.  CT head without any acute findings.  Given need for further care, [facility] contacted for admission.    Hospital Course:   # CAP Here with several days cough and infiltrate seen on CT/CXR, with leukocytosis and elevated procal. Covid/flu/rsv neg. 20-pathogen rvp negative. Much improved - ceftriaxone /doxy - cefpodoxime /doxy to complete 5 day course - follow urine antigens, blood cultures   # Hypoxia? On 2 liters but don't see any documented hypoxia, weaned off by day of discharge   # Acute encephalopathy Likely 2/2 infection. Resolved   # Sepsis By leukocytosis, tachypnea. Resolved with abx and fluids   # Acute cystitis? Urinalysis equivocal, did present with one day increased frequency. CT abdomen/pelvis nothing acute.  - follow culture, pending at time of discharge. Daughter's contact info confirmed should we need to change abx  - abx as above   # Debility Ambulates with walker at baseline - PT advising home health, ordered   # HTN Bp normal  - holding home amlodipine , lisinopril  - resume home lipitor   # Leukocytosis # Thrombocytopenia Mild, likely 2/2 infection. 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   # A-fib Rate controlled - home xarelto    # Hypothyroid - home synthroid    # SIADH Sodium wnl - home  salt tabs    Procedures: none   Consultations: none  Discharge Exam: Vitals:   07/15/24 0358 07/15/24 0810  BP: 136/79 (!) 142/60  Pulse: 77 74  Resp: 18   Temp: 98.2 F (36.8 C) 98.3 F (36.8 C)  SpO2: 98% 98%    General: NAD Cardiovascular: RRR Respiratory: CTAB save for basilar rales  Discharge  Instructions   Discharge Instructions     Diet - low sodium heart healthy   Complete by: As directed    Increase activity slowly   Complete by: As directed       Allergies as of 07/15/2024       Reactions   Alphagan [brimonidine] Itching, Rash   Pravachol [pravastatin Sodium] Itching, Rash        Medication List     TAKE these medications    acetaminophen  500 MG tablet Commonly known as: TYLENOL  Take 500 mg by mouth every 8 (eight) hours as needed for moderate pain.   amLODipine  2.5 MG tablet Commonly known as: NORVASC  Take 1 tablet (2.5 mg total) by mouth daily.   atorvastatin  20 MG tablet Commonly known as: LIPITOR Take 1 tablet by mouth in the evening   CALCIUM  1200 PO Take 1,000 Units by mouth daily. daily   cefpodoxime  200 MG tablet Commonly known as: VANTIN  Take 1 tablet (200 mg total) by mouth 2 (two) times daily for 3 days.   Cholecalciferol  25 MCG (1000 UT) tablet Take 1,000 Units by mouth daily.   cyanocobalamin  100 MCG tablet Commonly known as: VITAMIN B12 Take 100 mcg by mouth daily.   dorzolamide  2 % ophthalmic solution Commonly known as: TRUSOPT  Place 1 drop into the right eye 2 (two) times daily.   doxycycline  100 MG capsule Commonly known as: VIBRAMYCIN  Take 1 capsule (100 mg total) by mouth 2 (two) times daily for 3 days.   ferrous sulfate  325 (65 FE) MG tablet Take 2 tablets (650 mg total) by mouth daily with breakfast.   latanoprost  0.005 % ophthalmic solution Commonly known as: XALATAN  Place 1 drop into the right eye at bedtime.   levothyroxine  75 MCG tablet Commonly known as: SYNTHROID  TAKE 1 TABLET BY MOUTH ON MONDAYS, WEDNESDAYS, FRIDAYS, AND SUNDAYS What changed: Another medication with the same name was changed. Make sure you understand how and when to take each.   levothyroxine  88 MCG tablet Commonly known as: SYNTHROID  TAKE 1 TABLET BY MOUTH ONCE DAILY ON TUESDAYS, THURSDAYS AND SATURDAYS What changed:  how much to  take how to take this when to take this   lisinopril  10 MG tablet Commonly known as: ZESTRIL  Take 1 tablet by mouth daily as needed for systolic BP > than 160.   ondansetron  8 MG tablet Commonly known as: ZOFRAN  Take 8 mg by mouth as needed.   polyethylene glycol 17 g packet Commonly known as: MIRALAX  / GLYCOLAX  Take 17 g by mouth daily.   sodium chloride  1 g tablet Take 1 g by mouth. Takes 1/2 tablet with breakfast, 1/2 tablet with lunch, full tablet at supper   sodium chloride  5 % ophthalmic solution Commonly known as: MURO 128 Place 1 drop into the right eye in the morning and at bedtime. Place 1 drop into the right eye in the morning, and at supper.   spironolactone  25 MG tablet Commonly known as: ALDACTONE  Take 1 tablet by mouth once daily   timolol  0.5 % ophthalmic solution Commonly known as: TIMOPTIC  Place 1 drop into the right eye 2 (two)  times daily.   Xarelto  15 MG Tabs tablet Generic drug: Rivaroxaban  TAKE 1 TABLET EVERY DAY WITH SUPPER       Allergies[1]  Follow-up Information     Bernardo Fend, DO Follow up.   Specialty: Internal Medicine Why: in about 1 week Contact information: [address] [place] [address] [phone]                  The results of significant diagnostics from this hospitalization (including imaging, microbiology, ancillary and laboratory) are listed below for reference.    Significant Diagnostic Studies: DG Chest Port 1 View Result Date: 07/13/2024 EXAM: 1 VIEW(S) XRAY OF THE CHEST 07/13/2024 06:01:19 PM COMPARISON: None available. CLINICAL HISTORY: Sepsis (HCC) FINDINGS: LINES, TUBES AND DEVICES: Dual lead pacemaker leads are present and appear to be in appropriate position. LUNGS AND PLEURA: Right perihilar airspace opacities. No pleural effusion. No pneumothorax. HEART AND MEDIASTINUM: Atherosclerotic calcifications. No acute abnormality of the cardiac and mediastinal silhouettes. BONES  AND SOFT TISSUES: Left chest wall dual lead pacemaker generator. No acute osseous abnormality. IMPRESSION: 1. Right perihilar airspace opacities, concerning for pneumonia. Follow-up x-ray recommended in 4 to 6 weeks to confirm resolution. If there is high clinical concern for hilar pathology, recommend CT. 2. Left chest wall dual lead pacemaker. Electronically signed by: Greig Pique MD 07/13/2024 06:17 PM EST RP Workstation: [id number]   CT ABDOMEN PELVIS W CONTRAST Result Date: 07/13/2024 EXAM: CT ABDOMEN AND PELVIS WITH CONTRAST 07/13/2024 03:08:26 PM TECHNIQUE: CT of the abdomen and pelvis was performed with the administration of 100 mL Omnipaque  contrast. Multiplanar reformatted images are provided for review. Automated exposure control, iterative reconstruction, and/or weight-based adjustment of the mA/kV was utilized to reduce the radiation dose to as low as reasonably achievable. COMPARISON: None available. CLINICAL HISTORY: altered, leukocytosis, lower abd pain FINDINGS: LOWER CHEST: There is nodular consolidation in the medial aspect of the right lower lobe consistent with pneumonia versus aspiration pneumonitis. LIVER: The liver is unremarkable. GALLBLADDER AND BILE DUCTS: Cholecystectomy. SPLEEN: No acute abnormality. PANCREAS: No acute abnormality. ADRENAL GLANDS: No acute abnormality. KIDNEYS, URETERS AND BLADDER: No stones in the kidneys or ureters. No hydronephrosis. No perinephric or periureteral stranding. The bladder is moderately distended. Small amount of gas in the bladder presumably related to instrumentation. GI AND BOWEL: Stomach demonstrates no acute abnormality. No small bowel obstruction or inflammation. Appendix is not identified. Colon is normal. PERITONEUM AND RETROPERITONEUM: No ascites. No free air. VASCULATURE: Aorta is normal in caliber. LYMPH NODES: No lymphadenopathy. REPRODUCTIVE ORGANS: No acute abnormality. BONES AND SOFT TISSUES: No acute osseous abnormality. No focal soft  tissue abnormality. IMPRESSION: 1. Nodular consolidation in the medial aspect of the right lower lobe, consistent with pneumonia versus aspiration pneumonitis. 2.  No bowel obstruction or inflammatory process seen. 3. Bladder distention. Electronically signed by: Norleen Boxer MD 07/13/2024 04:05 PM EST RP Workstation: [id number]   CT HEAD WO CONTRAST ( ) Result Date: 07/13/2024 EXAM: CT HEAD WITHOUT CONTRAST 07/13/2024 03:08:26 PM TECHNIQUE: CT of the head was performed without the administration of intravenous contrast. Automated exposure control, iterative reconstruction, and/or weight based adjustment of the mA/kV was utilized to reduce the radiation dose to as low as reasonably achievable. COMPARISON: Comparison is 87:23. CLINICAL HISTORY: nonfocal altered FINDINGS: BRAIN AND VENTRICLES: No acute hemorrhage. No evidence of acute infarct. No hydrocephalus. No extra-axial collection. No mass effect or midline shift. Moderate atrophy and diffuse white matter changes are similar to the prior exam. Atherosclerotic calcifications within cavernous  internal carotid arteries. ORBITS: Bilateral lens replacement. SINUSES: No acute abnormality. SOFT TISSUES AND SKULL: No acute soft tissue abnormality. No skull fracture. IMPRESSION: 1. No acute intracranial abnormality. 2. Moderate atrophy and diffuse white matter changes, similar to the prior exam. Electronically signed by: Lonni Necessary MD 07/13/2024 03:44 PM EST RP Workstation: [id number]   CUP PACEART REMOTE DEVICE CHECK Result Date: 06/26/2024 Pacemaker: Scheduled remote reviewed. Normal device function.  Presenting rhythm: VS Next remote transmission per protocol. ML, CVRS   Microbiology: Recent Results (from the past 240 hours)  Culture, blood (single)     Status: None (Preliminary result)   Collection Time: 07/13/24  4:45 PM   Specimen: BLOOD  Result Value Ref Range Status   Specimen Description BLOOD RIGHT ANTECUBITAL  Final   Special Requests    Final    BOTTLES DRAWN AEROBIC AND ANAEROBIC Blood Culture adequate volume   Culture   Final    NO GROWTH 2 DAYS Performed at [facility], [address], [place], [place] [address]    Report Status PENDING  Incomplete  Blood culture (routine x 2)     Status: None (Preliminary result)   Collection Time: 07/13/24  4:45 PM   Specimen: BLOOD  Result Value Ref Range Status   Specimen Description BLOOD BLOOD LEFT WRIST  Final   Special Requests   Final    BOTTLES DRAWN AEROBIC AND ANAEROBIC Blood Culture adequate volume   Culture   Final    NO GROWTH 2 DAYS Performed at [facility], [address], [place], [place] [address]    Report Status PENDING  Incomplete  Resp panel by RT-PCR (RSV, Flu A&B, Covid) Anterior Nasal Swab     Status: None   Collection Time: 07/13/24 11:35 PM   Specimen: Anterior Nasal Swab  Result Value Ref Range Status   SARS Coronavirus 2 by RT PCR NEGATIVE NEGATIVE Final    Comment: (NOTE) SARS-CoV-2 target nucleic acids are NOT DETECTED.  The SARS-CoV-2 RNA is generally detectable in upper respiratory specimens during the acute phase of infection. The lowest concentration of SARS-CoV-2 viral copies this assay can detect is 138 copies/mL. A negative result does not preclude SARS-Cov-2 infection and should not be used as the sole basis for treatment or other patient management decisions. A negative result may occur with  improper specimen collection/handling, submission of specimen other than nasopharyngeal swab, presence of viral mutation(s) within the areas targeted by this assay, and inadequate number of viral copies(<138 copies/mL). A negative result must be combined with clinical observations, patient history, and epidemiological information. The expected result is Negative.  Fact Sheet for Patients:  bloggercourse.com  Fact Sheet for Healthcare Providers:   seriousbroker.it  This test is no t yet approved or cleared by the [place]  FDA and  has been authorized for detection and/or diagnosis of SARS-CoV-2 by FDA under an Emergency Use Authorization (EUA). This EUA will remain  in effect (meaning this test can be used) for the duration of the COVID-19 declaration under Section 564(b)(1) of the Act, 21 U.S.C.section 360bbb-3(b)(1), unless the authorization is terminated  or revoked sooner.       Influenza A by PCR NEGATIVE NEGATIVE Final   Influenza B by PCR NEGATIVE NEGATIVE Final    Comment: (NOTE) The Xpert Xpress SARS-CoV-2/FLU/RSV plus assay is intended as an aid in the diagnosis of influenza from Nasopharyngeal swab specimens and should not be used as a sole basis for treatment. Nasal washings and aspirates are unacceptable  for Xpert Xpress SARS-CoV-2/FLU/RSV testing.  Fact Sheet for Patients: bloggercourse.com  Fact Sheet for Healthcare Providers: seriousbroker.it  This test is not yet approved or cleared by the [place]  FDA and has been authorized for detection and/or diagnosis of SARS-CoV-2 by FDA under an Emergency Use Authorization (EUA). This EUA will remain in effect (meaning this test can be used) for the duration of the COVID-19 declaration under Section 564(b)(1) of the Act, 21 U.S.C. section 360bbb-3(b)(1), unless the authorization is terminated or revoked.     Resp Syncytial Virus by PCR NEGATIVE NEGATIVE Final    Comment: (NOTE) Fact Sheet for Patients: bloggercourse.com  Fact Sheet for Healthcare Providers: seriousbroker.it  This test is not yet approved or cleared by the [place]  FDA and has been authorized for detection and/or diagnosis of SARS-CoV-2 by FDA under an Emergency Use Authorization (EUA). This EUA will remain in effect (meaning this test can be used) for  the duration of the COVID-19 declaration under Section 564(b)(1) of the Act, 21 U.S.C. section 360bbb-3(b)(1), unless the authorization is terminated or revoked.  Performed at [facility], [address] [place], [place] [address]   Respiratory (~20 pathogens) panel by PCR     Status: None   Collection Time: 07/14/24  9:40 AM   Specimen: Nasopharyngeal Swab; Respiratory  Result Value Ref Range Status   Adenovirus NOT DETECTED NOT DETECTED Final   Coronavirus 229E NOT DETECTED NOT DETECTED Final    Comment: (NOTE) The Coronavirus on the Respiratory Panel, DOES NOT test for the novel  Coronavirus (2019 nCoV)    Coronavirus HKU1 NOT DETECTED NOT DETECTED Final   Coronavirus NL63 NOT DETECTED NOT DETECTED Final   Coronavirus OC43 NOT DETECTED NOT DETECTED Final   Metapneumovirus NOT DETECTED NOT DETECTED Final   Rhinovirus / Enterovirus NOT DETECTED NOT DETECTED Final   Influenza A NOT DETECTED NOT DETECTED Final   Influenza B NOT DETECTED NOT DETECTED Final   Parainfluenza Virus 1 NOT DETECTED NOT DETECTED Final   Parainfluenza Virus 2 NOT DETECTED NOT DETECTED Final   Parainfluenza Virus 3 NOT DETECTED NOT DETECTED Final   Parainfluenza Virus 4 NOT DETECTED NOT DETECTED Final   Respiratory Syncytial Virus NOT DETECTED NOT DETECTED Final   Bordetella pertussis NOT DETECTED NOT DETECTED Final   Bordetella Parapertussis NOT DETECTED NOT DETECTED Final   Chlamydophila pneumoniae NOT DETECTED NOT DETECTED Final   Mycoplasma pneumoniae NOT DETECTED NOT DETECTED Final    Comment: Performed at [facility], [address], [place], [place] [address]     Labs: Basic Metabolic Panel: Recent Labs  Lab 07/13/24 1200 07/14/24 0416 07/15/24 0445  NA 133* 136 138  K 4.1 4.2 4.1  CL 101 104 107  CO2 21* 22 22  GLUCOSE 105* 89 115*  BUN 16 13 15   CREATININE 0.78 0.75 0.82  CALCIUM  9.3 8.7* 9.0  MG 1.9  --   --    Liver Function Tests: Recent Labs  Lab  07/13/24 1200  AST 26  ALT 11  ALKPHOS 40  BILITOT 0.8  PROT 6.7  ALBUMIN 3.6   No results for input(s): LIPASE, AMYLASE in the last 168 hours. No results for input(s): AMMONIA in the last 168 hours. CBC: Recent Labs  Lab 07/13/24 1200 07/14/24 0416 07/15/24 0445  WBC 17.4* 13.8* 7.8  HGB 12.6 11.2* 10.4*  HCT 37.1 33.9* 31.2*  MCV 98.1 100.0 97.8  PLT 185 131* 127*   Cardiac Enzymes: No results for input(s):  CKTOTAL, CKMB, CKMBINDEX, TROPONINI in the last 168 hours. BNP: BNP (last 3 results) No results for input(s): BNP in the last 8760 hours.  ProBNP (last 3 results) No results for input(s): PROBNP in the last 8760 hours.  CBG: Recent Labs  Lab 07/14/24 0751 07/15/24 0808  GLUCAP 93 146*       Signed:  Devaughn KATHEE Ban MD.  [facility] 07/15/2024, 8:38 AM     [1]  Allergies Allergen Reactions   Alphagan [Brimonidine] Itching and Rash   Pravachol [Pravastatin Sodium] Itching and Rash

## 2024-07-15 NOTE — Progress Notes (Signed)
 Discharge instructions given to patient, questions answered. IV removed without complications. Patients medications delivered at bedside. Patient transporting home via car by her daughter.

## 2024-07-15 NOTE — TOC Transition Note (Signed)
 Transition of Care [facility]) - Discharge Note   Patient Details  Name: Sydney Mcdaniel MRN: [id number] Date of Birth: 01-Apr-1925  [facility] CM/SW Contact:  Alfonso Rummer, LCSW Phone Number: 07/15/2024, 10:01 AM   Clinical Narrative:    Pt will discharge to daughter home via family transport. [facility] will provide pt and ot services upon discharge No further toc needs.    Final next level of care: Home w Home Health Services Barriers to Discharge: Barriers Resolved   Patient Goals and CMS Choice     Choice offered to / list presented to : Patient      Discharge Placement                  Name of family member notified: Nattie Lazenby family at bedside Patient and family notified of of transfer: 07/15/24  Discharge Plan and Services Additional resources added to the After Visit Summary for                            [facility] Arranged: PT, OT [facility] Agency: [facility] Date [facility] Agency Contacted: 07/15/24   Representative spoke with at [facility] Agency: [place]  Pack  Social Drivers of Health (SDOH) Interventions SDOH Screenings   Food Insecurity: No Food Insecurity (07/15/2024)  Housing: Low Risk (07/15/2024)  Transportation Needs: No Transportation Needs (07/15/2024)  Utilities: Not At Risk (07/15/2024)  Alcohol Screen: Low Risk (10/26/2023)  Depression (PHQ2-9): Low Risk (05/15/2024)  Financial Resource Strain: Medium Risk (10/26/2023)  Physical Activity: Unknown (10/26/2023)  Social Connections: Unknown (07/15/2024)  Stress: No Stress Concern Present (10/26/2023)  Tobacco Use: Low Risk (07/13/2024)     Readmission Risk Interventions     No data to display

## 2024-07-15 NOTE — Plan of Care (Signed)

## 2024-07-16 LAB — LEGIONELLA PNEUMOPHILA SEROGP 1 UR AG: L. pneumophila Serogp 1 Ur Ag: NEGATIVE

## 2024-07-18 LAB — CULTURE, BLOOD (ROUTINE X 2)
Culture: NO GROWTH
Special Requests: ADEQUATE

## 2024-07-18 LAB — CULTURE, BLOOD (SINGLE)
Culture: NO GROWTH
Special Requests: ADEQUATE

## 2024-07-21 ENCOUNTER — Inpatient Hospital Stay: Admitting: Internal Medicine

## 2024-07-24 ENCOUNTER — Ambulatory Visit (INDEPENDENT_AMBULATORY_CARE_PROVIDER_SITE_OTHER): Admitting: Internal Medicine

## 2024-07-24 ENCOUNTER — Other Ambulatory Visit: Payer: Self-pay

## 2024-07-24 ENCOUNTER — Encounter: Payer: Self-pay | Admitting: Internal Medicine

## 2024-07-24 VITALS — BP 120/80 | HR 72 | Temp 97.8°F | Resp 14 | Ht 62.0 in | Wt 135.7 lb

## 2024-07-24 DIAGNOSIS — Z87440 Personal history of urinary (tract) infections: Secondary | ICD-10-CM | POA: Diagnosis not present

## 2024-07-24 DIAGNOSIS — Z09 Encounter for follow-up examination after completed treatment for conditions other than malignant neoplasm: Secondary | ICD-10-CM

## 2024-07-24 DIAGNOSIS — Z8701 Personal history of pneumonia (recurrent): Secondary | ICD-10-CM

## 2024-07-24 DIAGNOSIS — I10 Essential (primary) hypertension: Secondary | ICD-10-CM | POA: Diagnosis not present

## 2024-07-24 NOTE — Progress Notes (Signed)
 "  Established Patient Office Visit  Subjective   Patient ID: Sydney Mcdaniel, female    DOB: December 19, 1924  Age: 89 y.o. MRN: [id number]  Chief Complaint  Patient presents with   Hospitalization Follow-up    HPI Sydney Mcdaniel is here for hospital follow up. She is here with her daughter.   Discharge Date: 07/15/24 Diagnosis: sepsis, UTI and PNA Procedures/tests: WBC 17.4, mild hyponatremia, UA consistent with UTI. Chest x-ray  New medications: Cefpodoxime  and Doxycycline , finished last week Discontinued medications: None Status: better   Discussed the use of AI scribe software for clinical note transcription with the patient, who gave verbal consent to proceed.  History of Present Illness Sydney Mcdaniel is a 89 year old female with chronic lymphocytic leukemia who presents with recent hospitalization for uro sepsis and pneumonia.  She was recently hospitalized for urosepsis after developing confusion, fatigue, and tremors. Workup showed a urine culture with multiple species and urinalysis with hematuria and leukocyturia. She was treated with IV fluids and broad-spectrum IV antibiotics, then discharged on oral doxycycline  and cephoxidine, which she completed last Thursday.  During the same admission she was diagnosed with right lower lobe pneumonia. COVID-19 testing was negative and blood cultures were negative.  Since discharge she has had increased fatigue, more daytime sleep, and intermittent weakness. She had one brief episode of nighttime coughing lasting about five minutes and denies productive cough. She feels more short of breath than usual with walking.  Her appetite has improved and she is eating normally, with weight gain from 118 to 135 pounds. She uses Miralax  as needed and has regular bowel movements. She reports normal urination but notes wetter underpads than usual.    Review of Systems  Constitutional:  Negative for chills and fever.  Respiratory:  Negative for  cough, shortness of breath and wheezing.   Genitourinary: Negative.   Neurological:  Positive for weakness.      Objective:     BP 120/80 (Cuff Size: Normal)   Pulse 72   Temp 97.8 F (36.6 C) (Oral)   Resp 14   Ht 5' 2 (1.575 m)   Wt 135 lb 11.2 oz (61.6 kg)   LMP  (LMP Unknown)   SpO2 96%   BMI 24.82 kg/m  BP Readings from Last 3 Encounters:  07/24/24 120/80  07/15/24 (!) 142/60  05/21/24 120/60   Wt Readings from Last 3 Encounters:  07/24/24 135 lb 11.2 oz (61.6 kg)  07/15/24 145 lb 15.1 oz (66.2 kg)  05/21/24 133 lb (60.3 kg)    Physical Exam Constitutional:      Appearance: Normal appearance.  HENT:     Head: Normocephalic and atraumatic.  Eyes:     Conjunctiva/sclera: Conjunctivae normal.  Cardiovascular:     Rate and Rhythm: Normal rate and regular rhythm.  Pulmonary:     Effort: Pulmonary effort is normal.     Breath sounds: Normal breath sounds. No wheezing, rhonchi or rales.  Skin:    General: Skin is warm and dry.  Neurological:     General: No focal deficit present.     Mental Status: She is alert. Mental status is at baseline.  Psychiatric:        Mood and Affect: Mood normal.        Behavior: Behavior normal.      No results found for any visits on 07/24/24.  Last CBC Lab Results  Component Value Date   WBC 7.8 07/15/2024   HGB 10.4 (L)  07/15/2024   HCT 31.2 (L) 07/15/2024   MCV 97.8 07/15/2024   MCH 32.6 07/15/2024   RDW 13.2 07/15/2024   PLT 127 (L) 07/15/2024   Last metabolic panel Lab Results  Component Value Date   GLUCOSE 115 (H) 07/15/2024   NA 138 07/15/2024   K 4.1 07/15/2024   CL 107 07/15/2024   CO2 22 07/15/2024   BUN 15 07/15/2024   CREATININE 0.82 07/15/2024   EGFR 50 (L) 05/15/2024   CALCIUM  9.0 07/15/2024   PROT 6.7 07/13/2024   ALBUMIN 3.6 07/13/2024   LABGLOB 2.4 06/22/2015   AGRATIO 1.6 06/22/2015   BILITOT 0.8 07/13/2024   ALKPHOS 40 07/13/2024   AST 26 07/13/2024   ALT 11 07/13/2024   ANIONGAP  9 07/15/2024   Last lipids Lab Results  Component Value Date   CHOL 171 11/17/2020   HDL 55 11/17/2020   LDLCALC 90 11/17/2020   TRIG 147 11/17/2020   CHOLHDL 3.1 11/17/2020   Last hemoglobin A1c Lab Results  Component Value Date   HGBA1C 6.5 (H) 02/11/2024   Last thyroid  functions Lab Results  Component Value Date   TSH 3.61 05/15/2024   T4TOTAL 9.2 12/11/2022   Last vitamin D  Lab Results  Component Value Date   VD25OH 59 05/15/2024   Last vitamin B12 and Folate Lab Results  Component Value Date   VITAMINB12 496 05/15/2024   FOLATE 22.0 11/16/2021      The ASCVD Risk score (Arnett DK, et al., 2019) failed to calculate for the following reasons:   The 2019 ASCVD risk score is only valid for ages 18 to 53   * - Cholesterol units were assumed    Assessment & Plan:   Assessment & Plan  Recent urosepsis and pneumonia, post-hospital follow-up Recent hospitalization for urosepsis and pneumonia. Urine culture suggested possible contamination. Blood cultures negative. CT scan showed moderately distended bladder with small amount of gas, no clear infection. Pneumonia in right lower lobe. Symptoms improved, but fatigue and occasional cough persist. Oxygen saturation and blood pressure stable. - Ordered urine culture to identify organism. - Ordered chest x-ray for end of February to assess pneumonia resolution. - Advised to report new or worsening symptoms such as fever, chills, or increased fatigue. - Continue occupational and physical therapy as scheduled.  Essential hypertension Blood pressure well-controlled at 120/80 mmHg. - Continue current antihypertensive regimen.  - DG Chest 2 View; Future   Return for already scheduled.    Sharyle Fischer, DO  "

## 2024-07-25 ENCOUNTER — Other Ambulatory Visit: Payer: Self-pay

## 2024-07-25 DIAGNOSIS — R300 Dysuria: Secondary | ICD-10-CM

## 2024-07-25 LAB — POCT URINALYSIS DIPSTICK
Bilirubin, UA: NEGATIVE
Blood, UA: NEGATIVE
Glucose, UA: NEGATIVE
Ketones, UA: NEGATIVE
Leukocytes, UA: NEGATIVE
Nitrite, UA: NEGATIVE
Odor: NORMAL
Protein, UA: NEGATIVE
Spec Grav, UA: 1.02
Urobilinogen, UA: 0.2 U/dL
pH, UA: 5

## 2024-09-24 ENCOUNTER — Ambulatory Visit

## 2024-11-12 ENCOUNTER — Ambulatory Visit: Admitting: Internal Medicine

## 2024-11-13 ENCOUNTER — Ambulatory Visit

## 2024-12-24 ENCOUNTER — Ambulatory Visit

## 2025-03-25 ENCOUNTER — Ambulatory Visit

## 2025-06-24 ENCOUNTER — Ambulatory Visit
